# Patient Record
Sex: Female | Born: 1950 | Race: White | Hispanic: No | Smoking: Former smoker
Health system: Southern US, Community
[De-identification: ages and names within clinical notes are randomized; demographics above are authoritative.]

## PROBLEM LIST (undated history)

## (undated) DIAGNOSIS — Z862 Personal history of diseases of the blood and blood-forming organs and certain disorders involving the immune mechanism: Secondary | ICD-10-CM

## (undated) DIAGNOSIS — I509 Heart failure, unspecified: Secondary | ICD-10-CM

## (undated) DIAGNOSIS — I10 Essential (primary) hypertension: Secondary | ICD-10-CM

## (undated) DIAGNOSIS — K219 Gastro-esophageal reflux disease without esophagitis: Secondary | ICD-10-CM

## (undated) DIAGNOSIS — I2510 Atherosclerotic heart disease of native coronary artery without angina pectoris: Secondary | ICD-10-CM

## (undated) DIAGNOSIS — Z9289 Personal history of other medical treatment: Secondary | ICD-10-CM

## (undated) DIAGNOSIS — R011 Cardiac murmur, unspecified: Secondary | ICD-10-CM

## (undated) DIAGNOSIS — I5020 Unspecified systolic (congestive) heart failure: Secondary | ICD-10-CM

## (undated) DIAGNOSIS — M542 Cervicalgia: Secondary | ICD-10-CM

## (undated) DIAGNOSIS — N6019 Diffuse cystic mastopathy of unspecified breast: Secondary | ICD-10-CM

## (undated) DIAGNOSIS — E282 Polycystic ovarian syndrome: Secondary | ICD-10-CM

## (undated) DIAGNOSIS — M1990 Unspecified osteoarthritis, unspecified site: Secondary | ICD-10-CM

## (undated) DIAGNOSIS — N189 Chronic kidney disease, unspecified: Secondary | ICD-10-CM

## (undated) DIAGNOSIS — F419 Anxiety disorder, unspecified: Secondary | ICD-10-CM

## (undated) DIAGNOSIS — C8190 Hodgkin lymphoma, unspecified, unspecified site: Secondary | ICD-10-CM

## (undated) DIAGNOSIS — O139 Gestational [pregnancy-induced] hypertension without significant proteinuria, unspecified trimester: Secondary | ICD-10-CM

## (undated) DIAGNOSIS — I428 Other cardiomyopathies: Secondary | ICD-10-CM

## (undated) DIAGNOSIS — C541 Malignant neoplasm of endometrium: Secondary | ICD-10-CM

## (undated) HISTORY — DX: Hodgkin lymphoma, unspecified, unspecified site: C81.90

## (undated) HISTORY — DX: Gestational (pregnancy-induced) hypertension without significant proteinuria, unspecified trimester: O13.9

## (undated) HISTORY — DX: Personal history of other medical treatment: Z92.89

## (undated) HISTORY — DX: Atherosclerotic heart disease of native coronary artery without angina pectoris: I25.10

## (undated) HISTORY — DX: Personal history of diseases of the blood and blood-forming organs and certain disorders involving the immune mechanism: Z86.2

## (undated) HISTORY — DX: Polycystic ovarian syndrome: E28.2

## (undated) HISTORY — DX: Cervicalgia: M54.2

## (undated) HISTORY — PX: CERVICAL POLYPECTOMY: SHX88

## (undated) HISTORY — DX: Diffuse cystic mastopathy of unspecified breast: N60.19

## (undated) HISTORY — DX: Chronic kidney disease, unspecified: N18.9

## (undated) HISTORY — DX: Cardiac murmur, unspecified: R01.1

## (undated) HISTORY — DX: Essential (primary) hypertension: I10

## (undated) HISTORY — DX: Malignant neoplasm of endometrium: C54.1

## (undated) HISTORY — PX: OTHER SURGICAL HISTORY: SHX169

## (undated) HISTORY — PX: ABDOMINAL HYSTERECTOMY: SHX81

## (undated) HISTORY — DX: Unspecified osteoarthritis, unspecified site: M19.90

---
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
## 1980-06-15 HISTORY — PX: CHOLECYSTECTOMY: SHX55

## 2004-07-17 ENCOUNTER — Ambulatory Visit: Payer: Self-pay | Admitting: Internal Medicine

## 2004-07-29 ENCOUNTER — Ambulatory Visit: Payer: Self-pay | Admitting: Unknown Physician Specialty

## 2005-08-11 ENCOUNTER — Ambulatory Visit: Payer: Self-pay | Admitting: Unknown Physician Specialty

## 2006-11-18 ENCOUNTER — Ambulatory Visit: Payer: Self-pay | Admitting: Unknown Physician Specialty

## 2007-06-16 HISTORY — PX: OTHER SURGICAL HISTORY: SHX169

## 2007-12-28 ENCOUNTER — Ambulatory Visit: Payer: Self-pay | Admitting: Gastroenterology

## 2008-01-13 LAB — HM COLONOSCOPY: HM Colonoscopy: NORMAL

## 2008-09-12 LAB — HM COLONOSCOPY: HM Colonoscopy: NORMAL

## 2009-06-04 ENCOUNTER — Ambulatory Visit: Payer: Self-pay | Admitting: Internal Medicine

## 2009-06-15 ENCOUNTER — Ambulatory Visit: Payer: Self-pay | Admitting: Internal Medicine

## 2009-06-15 DIAGNOSIS — C8190 Hodgkin lymphoma, unspecified, unspecified site: Secondary | ICD-10-CM

## 2009-06-15 HISTORY — DX: Hodgkin lymphoma, unspecified, unspecified site: C81.90

## 2009-06-15 HISTORY — PX: LYMPH NODE BIOPSY: SHX201

## 2009-06-19 LAB — PULMONARY FUNCTION TEST

## 2009-06-25 ENCOUNTER — Ambulatory Visit: Payer: Self-pay | Admitting: Specialist

## 2009-07-01 ENCOUNTER — Ambulatory Visit: Payer: Self-pay | Admitting: General Surgery

## 2009-07-03 ENCOUNTER — Ambulatory Visit: Payer: Self-pay | Admitting: Internal Medicine

## 2009-07-10 ENCOUNTER — Inpatient Hospital Stay: Payer: Self-pay | Admitting: General Surgery

## 2009-07-16 ENCOUNTER — Ambulatory Visit: Payer: Self-pay | Admitting: Internal Medicine

## 2009-07-24 ENCOUNTER — Ambulatory Visit: Payer: Self-pay | Admitting: General Surgery

## 2009-08-13 ENCOUNTER — Ambulatory Visit: Payer: Self-pay | Admitting: Internal Medicine

## 2009-09-13 ENCOUNTER — Ambulatory Visit: Payer: Self-pay | Admitting: Internal Medicine

## 2009-10-13 ENCOUNTER — Ambulatory Visit: Payer: Self-pay | Admitting: Internal Medicine

## 2009-10-21 ENCOUNTER — Ambulatory Visit: Payer: Self-pay | Admitting: Internal Medicine

## 2009-11-13 ENCOUNTER — Ambulatory Visit: Payer: Self-pay | Admitting: Internal Medicine

## 2009-12-13 ENCOUNTER — Ambulatory Visit: Payer: Self-pay | Admitting: Internal Medicine

## 2010-01-13 ENCOUNTER — Ambulatory Visit: Payer: Self-pay | Admitting: Internal Medicine

## 2010-02-13 ENCOUNTER — Ambulatory Visit: Payer: Self-pay | Admitting: Internal Medicine

## 2010-03-15 ENCOUNTER — Ambulatory Visit: Payer: Self-pay | Admitting: Internal Medicine

## 2010-04-15 ENCOUNTER — Ambulatory Visit: Payer: Self-pay | Admitting: Internal Medicine

## 2010-05-15 ENCOUNTER — Ambulatory Visit: Payer: Self-pay | Admitting: Internal Medicine

## 2010-06-18 ENCOUNTER — Ambulatory Visit: Payer: Self-pay | Admitting: Internal Medicine

## 2010-07-16 ENCOUNTER — Ambulatory Visit: Payer: Self-pay | Admitting: Internal Medicine

## 2010-08-14 ENCOUNTER — Ambulatory Visit: Payer: Self-pay | Admitting: Internal Medicine

## 2010-09-14 ENCOUNTER — Ambulatory Visit: Payer: Self-pay | Admitting: Internal Medicine

## 2010-10-14 ENCOUNTER — Ambulatory Visit: Payer: Self-pay | Admitting: Internal Medicine

## 2010-11-14 ENCOUNTER — Ambulatory Visit: Payer: Self-pay | Admitting: Internal Medicine

## 2011-01-19 ENCOUNTER — Ambulatory Visit: Payer: Self-pay | Admitting: Internal Medicine

## 2011-02-14 ENCOUNTER — Ambulatory Visit: Payer: Self-pay | Admitting: Internal Medicine

## 2011-03-03 LAB — IFOBT (OCCULT BLOOD): IFOBT: NEGATIVE

## 2011-05-19 ENCOUNTER — Ambulatory Visit: Payer: Self-pay | Admitting: Internal Medicine

## 2011-05-25 ENCOUNTER — Ambulatory Visit: Payer: Self-pay | Admitting: Internal Medicine

## 2011-05-28 ENCOUNTER — Ambulatory Visit: Payer: Self-pay | Admitting: Internal Medicine

## 2011-06-16 ENCOUNTER — Ambulatory Visit: Payer: Self-pay | Admitting: Internal Medicine

## 2011-06-16 HISTORY — PX: OTHER SURGICAL HISTORY: SHX169

## 2011-08-06 ENCOUNTER — Ambulatory Visit: Payer: Self-pay | Admitting: Internal Medicine

## 2011-09-13 LAB — HM MAMMOGRAPHY: HM Mammogram: NORMAL

## 2011-11-17 ENCOUNTER — Ambulatory Visit: Payer: Self-pay | Admitting: Internal Medicine

## 2011-11-17 LAB — CBC CANCER CENTER
Basophil #: 0.1 x10 3/mm (ref 0.0–0.1)
Basophil %: 1 %
Eosinophil #: 0.1 x10 3/mm (ref 0.0–0.7)
Eosinophil %: 2.6 %
HCT: 42.6 % (ref 35.0–47.0)
HGB: 14.1 g/dL (ref 12.0–16.0)
Lymphocyte #: 1.3 x10 3/mm (ref 1.0–3.6)
Lymphocyte %: 23 %
MCH: 29.7 pg (ref 26.0–34.0)
MCHC: 33.2 g/dL (ref 32.0–36.0)
MCV: 90 fL (ref 80–100)
Monocyte #: 0.6 x10 3/mm (ref 0.2–0.9)
Monocyte %: 10.5 %
Neutrophil #: 3.5 x10 3/mm (ref 1.4–6.5)
Neutrophil %: 62.9 %
Platelet: 206 x10 3/mm (ref 150–440)
RBC: 4.76 10*6/uL (ref 3.80–5.20)
RDW: 13.9 % (ref 11.5–14.5)
WBC: 5.5 x10 3/mm (ref 3.6–11.0)

## 2011-11-17 LAB — HEPATIC FUNCTION PANEL A (ARMC)
Albumin: 3.8 g/dL (ref 3.4–5.0)
Alkaline Phosphatase: 89 U/L (ref 50–136)
Bilirubin, Direct: 0.2 mg/dL (ref 0.00–0.20)
Bilirubin,Total: 0.8 mg/dL (ref 0.2–1.0)
SGOT(AST): 19 U/L (ref 15–37)
SGPT (ALT): 27 U/L
Total Protein: 7.3 g/dL (ref 6.4–8.2)

## 2011-11-17 LAB — CREATININE, SERUM
Creatinine: 1.04 mg/dL (ref 0.60–1.30)
EGFR (African American): >60
EGFR (Non-African Amer.): 58 — ABNORMAL LOW

## 2011-11-17 LAB — LACTATE DEHYDROGENASE: LDH: 201 U/L (ref 84–246)

## 2011-11-17 LAB — POTASSIUM: Potassium: 4.2 mmol/L (ref 3.5–5.1)

## 2011-11-17 LAB — MAGNESIUM: Magnesium: 2.1 mg/dL

## 2011-12-14 ENCOUNTER — Ambulatory Visit: Payer: Self-pay | Admitting: Internal Medicine

## 2012-02-14 DIAGNOSIS — I2510 Atherosclerotic heart disease of native coronary artery without angina pectoris: Secondary | ICD-10-CM

## 2012-02-14 HISTORY — PX: CARDIAC CATHETERIZATION: SHX172

## 2012-02-14 HISTORY — PX: CORONARY ANGIOPLASTY: SHX604

## 2012-02-14 HISTORY — DX: Atherosclerotic heart disease of native coronary artery without angina pectoris: I25.10

## 2012-02-29 ENCOUNTER — Ambulatory Visit (INDEPENDENT_AMBULATORY_CARE_PROVIDER_SITE_OTHER): Payer: BC Managed Care – PPO | Admitting: Cardiovascular Disease

## 2012-02-29 ENCOUNTER — Encounter: Payer: Self-pay | Admitting: Cardiovascular Disease

## 2012-02-29 VITALS — BP 142/98 | HR 67 | Ht 62.0 in | Wt 195.5 lb

## 2012-02-29 DIAGNOSIS — R079 Chest pain, unspecified: Secondary | ICD-10-CM

## 2012-02-29 MED ORDER — METOPROLOL TARTRATE 25 MG PO TABS: 25.0000 mg | ORAL_TABLET | Freq: Two times a day (BID) | ORAL | Status: DC

## 2012-02-29 NOTE — Assessment & Plan Note (Signed)
The patient's chest pain is worrisome for angina. She had a highly abnormal stress echocardiogram done at [facility] which showed 2 mm ST depression with exercise as well as significant ischemia in the LAD distribution. I recommend continuing aspirin daily and starting metoprolol 25 mg twice daily. I recommend proceeding with cardiac catheterization and possible coronary intervention. Risks, benefits and alternatives were discussed with the patient. Precath labs will be checked.

## 2012-02-29 NOTE — Progress Notes (Signed)
HPI  This is a 60 year old female who was referred by Dr. Judithann Sheen for evaluation of chest pain and abnormal stress echo. The patient has no previous cardiac history. There is a reported history of hypertension and hyperlipidemia but not on any medications. She has history of Hodgkin's lymphoma treated with radiation and chemotherapy to the chest area and has been in remission since 2011. She does have pulmonary nodules which are being followed regularly every 6 months. She is a former smoker. She started having chest discomfort about 2 months ago. Still described as substernal tightness which does not radiate. It mostly happens when she is under stress and when she is anxious. She hasn't noticed that much with physical activities. She does complain of exertional dyspnea with activities. She underwent a stress echocardiogram. She was able to exercise for only 3 minutes and achieved a maximal heart rate of 166 beats per minute. She had 2 mm of ST depression in the inferior leads. Echo images showed normal LV systolic function on baseline images there was evidence of moderate hypokinesis of the septum and severe hypokinesis of the anterior wall.  Allergies  Allergen Reactions  . Antifungal (Miconazole Nitrate)   . Sulfa Antibiotics   . Z-Pak (Azithromycin)      Current Outpatient Prescriptions on File Prior to Visit  Medication Sig Dispense Refill  . gabapentin (NEURONTIN) 300 MG capsule Take 300 mg by mouth at bedtime.      . metoprolol tartrate (LOPRESSOR) 25 MG tablet Take 1 tablet (25 mg total) by mouth 2 (two) times daily.  60 tablet  3     Past Medical History  Diagnosis Date  . Essential hypertension, benign   . Other and unspecified hyperlipidemia   . Chronic airway obstruction, not elsewhere classified   . Cervicalgia   . Osteoarthritis   . Fibrocystic breast disease   . Polycystic ovarian disease   . History of anemia   . Gestational hypertension   . Hodgkin's lymphoma    s/p radiation and chemo therapy     Past Surgical History  Procedure Date  . Cholecystectomy   . Bladder sling      History reviewed. No pertinent family history.   History   Social History  . Marital Status: Married    Spouse Name: N/A    Number of Children: N/A  . Years of Education: N/A   Occupational History  . Not on file.   Social History Main Topics  . Smoking status: Former Smoker -- 1.0 packs/day for 30 years    Types: Cigarettes  . Smokeless tobacco: Not on file  . Alcohol Use: Yes     occasional  . Drug Use: No  . Sexually Active:    Other Topics Concern  . Not on file   Social History Narrative  . No narrative on file     ROS Constitutional: Negative for fever, chills, diaphoresis, activity change, appetite change . HENT: Negative for hearing loss, nosebleeds, congestion, sore throat, facial swelling, drooling, trouble swallowing, neck pain, voice change, sinus pressure and tinnitus.  Eyes: Negative for photophobia, pain, discharge and visual disturbance.  Respiratory: Negative for apnea, cough and wheezing.  Cardiovascular: Negative for palpitations and leg swelling.  Gastrointestinal: Negative for nausea, vomiting, abdominal pain, diarrhea, constipation, blood in stool and abdominal distention.  Genitourinary: Negative for dysuria, urgency, frequency, hematuria and decreased urine volume.  Musculoskeletal: Negative for myalgias, back pain, joint swelling, arthralgias and gait problem.  Skin: Negative for color  change, pallor, rash and wound.  Neurological: Negative for dizziness, tremors, seizures, syncope, speech difficulty, weakness, light-headedness, numbness and headaches.  Psychiatric/Behavioral: Negative for suicidal ideas, hallucinations, behavioral problems and agitation. The patient is not nervous/anxious.     PHYSICAL EXAM   BP 142/98  Pulse 67  Ht 5\' 2"  (1.575 m)  Wt 195 lb 8 oz (88.678 kg)  BMI 35.76 kg/m2 Constitutional: She  is oriented to person, place, and time. She appears well-developed and well-nourished. No distress.  HENT: No nasal discharge.  Head: Normocephalic and atraumatic.  Eyes: Pupils are equal and round. Right eye exhibits no discharge. Left eye exhibits no discharge.  Neck: Normal range of motion. Neck supple. No JVD present. No thyromegaly present.  Cardiovascular: Normal rate, regular rhythm, normal heart sounds. Exam reveals no gallop and no friction rub. No murmur heard.  Pulmonary/Chest: Effort normal and breath sounds normal. No stridor. No respiratory distress. She has no wheezes. She has no rales. She exhibits no tenderness.  Abdominal: Soft. Bowel sounds are normal. She exhibits no distension. There is no tenderness. There is no rebound and no guarding.  Musculoskeletal: Normal range of motion. She exhibits no edema and no tenderness.  Neurological: She is alert and oriented to person, place, and time. Coordination normal.  Skin: Skin is warm and dry. No rash noted. She is not diaphoretic. No erythema. No pallor.  Psychiatric: She has a normal mood and affect. Her behavior is normal. Judgment and thought content normal.     EKG: Sinus  Rhythm  WITHIN NORMAL LIMITS   ASSESSMENT AND PLAN

## 2012-02-29 NOTE — Patient Instructions (Addendum)
Your physician has requested that you have a cardiac catheterization. Cardiac catheterization is used to diagnose and/or treat various heart conditions. Doctors may recommend this procedure for a number of different reasons. The most common reason is to evaluate chest pain. Chest pain can be a symptom of coronary artery disease (CAD), and cardiac catheterization can show whether plaque is narrowing or blocking your heart's arteries. This procedure is also used to evaluate the valves, as well as measure the blood flow and oxygen levels in different parts of your heart. For further information please visit [email or web address] Please follow instruction sheet, as given.  Start Metoprolol 25 mg twice daily.

## 2012-03-01 ENCOUNTER — Other Ambulatory Visit: Payer: Self-pay

## 2012-03-01 LAB — PROTIME-INR
INR: 1 (ref 0.8–1.2)
Prothrombin Time: 10.3 s (ref 9.1–12.0)

## 2012-03-01 LAB — BASIC METABOLIC PANEL
BUN/Creatinine Ratio: 13 (ref 11–26)
BUN: 13 mg/dL (ref 8–27)
CO2: 19 mmol/L (ref 19–28)
Calcium: 9.5 mg/dL (ref 8.6–10.2)
Chloride: 104 mmol/L (ref 97–108)
Creatinine, Ser: 1.01 mg/dL — ABNORMAL HIGH (ref 0.57–1.00)
GFR calc Af Amer: 69 mL/min/{1.73_m2} (ref >59)
GFR calc non Af Amer: 60 mL/min/{1.73_m2} (ref >59)
Glucose: 142 mg/dL — ABNORMAL HIGH (ref 65–99)
Potassium: 4.5 mmol/L (ref 3.5–5.2)
Sodium: 143 mmol/L (ref 134–144)

## 2012-03-01 LAB — CBC WITH DIFFERENTIAL
Basophils Absolute: 0 10*3/uL (ref 0.0–0.2)
Basos: 1 % (ref 0–3)
Eos: 1 % (ref 0–5)
Eosinophils Absolute: 0.1 10*3/uL (ref 0.0–0.4)
HCT: 44.5 % (ref 34.0–46.6)
Hemoglobin: 14.5 g/dL (ref 11.1–15.9)
Immature Grans (Abs): 0 10*3/uL (ref 0.0–0.1)
Immature Granulocytes: 0 % (ref 0–2)
Lymphocytes Absolute: 1.5 10*3/uL (ref 0.7–3.1)
Lymphs: 19 % (ref 14–46)
MCH: 29.7 pg (ref 26.6–33.0)
MCHC: 32.6 g/dL (ref 31.5–35.7)
MCV: 91 fL (ref 79–97)
Monocytes Absolute: 0.4 10*3/uL (ref 0.1–0.9)
Monocytes: 5 % (ref 4–12)
Neutrophils Absolute: 6.1 10*3/uL (ref 1.4–7.0)
Neutrophils Relative %: 74 % (ref 40–74)
Platelets: 268 10*3/uL (ref 155–379)
RBC: 4.89 x10E6/uL (ref 3.77–5.28)
RDW: 14 % (ref 12.3–15.4)
WBC: 8.1 10*3/uL (ref 3.4–10.8)

## 2012-03-01 MED ORDER — METOPROLOL TARTRATE 25 MG PO TABS: 25.0000 mg | ORAL_TABLET | Freq: Two times a day (BID) | ORAL | Status: DC

## 2012-03-04 ENCOUNTER — Other Ambulatory Visit: Payer: Self-pay | Admitting: Cardiovascular Disease

## 2012-03-04 ENCOUNTER — Ambulatory Visit: Payer: Self-pay | Admitting: Cardiovascular Disease

## 2012-03-04 DIAGNOSIS — I2510 Atherosclerotic heart disease of native coronary artery without angina pectoris: Secondary | ICD-10-CM

## 2012-03-04 LAB — CK TOTAL AND CKMB (NOT AT ARMC)
CK, Total: 56 U/L (ref 21–215)
CK-MB: 1.4 ng/mL (ref 0.5–3.6)

## 2012-03-04 MED ORDER — CLOPIDOGREL BISULFATE 75 MG PO TABS: 75.0000 mg | ORAL_TABLET | Freq: Every day | ORAL | Status: DC

## 2012-03-04 MED ORDER — ATORVASTATIN CALCIUM 40 MG PO TABS: 40.0000 mg | ORAL_TABLET | Freq: Every day | ORAL | Status: DC

## 2012-03-05 LAB — BASIC METABOLIC PANEL
Anion Gap: 11 (ref 7–16)
BUN: 15 mg/dL (ref 7–18)
Calcium, Total: 8.7 mg/dL (ref 8.5–10.1)
Chloride: 107 mmol/L (ref 98–107)
Co2: 26 mmol/L (ref 21–32)
Creatinine: 0.97 mg/dL (ref 0.60–1.30)
EGFR (African American): >60
EGFR (Non-African Amer.): >60
Glucose: 99 mg/dL (ref 65–99)
Osmolality: 288 (ref 275–301)
Potassium: 4 mmol/L (ref 3.5–5.1)
Sodium: 144 mmol/L (ref 136–145)

## 2012-03-09 ENCOUNTER — Ambulatory Visit: Payer: Self-pay | Admitting: Cardiovascular Disease

## 2012-03-09 ENCOUNTER — Encounter: Payer: Self-pay | Admitting: *Deleted

## 2012-03-10 ENCOUNTER — Encounter: Payer: Self-pay | Admitting: Cardiovascular Disease

## 2012-03-10 ENCOUNTER — Ambulatory Visit (INDEPENDENT_AMBULATORY_CARE_PROVIDER_SITE_OTHER): Payer: BC Managed Care – PPO | Admitting: Cardiovascular Disease

## 2012-03-10 VITALS — BP 140/80 | HR 76 | Ht 62.0 in | Wt 192.5 lb

## 2012-03-10 DIAGNOSIS — E785 Hyperlipidemia, unspecified: Secondary | ICD-10-CM

## 2012-03-10 DIAGNOSIS — I2510 Atherosclerotic heart disease of native coronary artery without angina pectoris: Secondary | ICD-10-CM

## 2012-03-10 DIAGNOSIS — I25810 Atherosclerosis of coronary artery bypass graft(s) without angina pectoris: Secondary | ICD-10-CM

## 2012-03-10 NOTE — Patient Instructions (Addendum)
Continue same medications.  Fasting labs in 1 month.  Try to attend cardiac rehab.  Resume work on Monday , March 14, 2012 without restrictions.   Follow up in 3 months.

## 2012-03-10 NOTE — Assessment & Plan Note (Signed)
She is doing well after her recent angioplasty and drug-eluting stent placement to the proximal LAD. Continue dual antiplatelet therapy for at least one year. The patient can resume work on Monday without restrictions. She can also resume bowling. I advised her to attend cardiac rehabilitation. The patient is planning to have a tooth extraction in the near future. I told her that there is no contraindication from a cardiac standpoint with both aspirin and Plavix cannot be stopped at this time. No need for antibiotic prophylaxis.

## 2012-03-10 NOTE — Assessment & Plan Note (Signed)
She was started on atorvastatin 40 mg once daily. I will obtain a fasting lipid and liver profile in 4 weeks from now.

## 2012-03-10 NOTE — Progress Notes (Signed)
HPI  This is a 61 year old female who is here today for a followup visit. She was seen recently for chest pain and abnormal stress echo. The patient has no previous cardiac history.She has history of Hodgkin's lymphoma treated with radiation and chemotherapy to the chest area and has been in remission since 2011. She does have pulmonary nodules which are being followed regularly every 6 months. She is a former smoker. Stress echocardiogram showed evidence of severe anterior wall ischemia. She underwent cardiac catheterization via the right radial artery which showed a 95% stenosis in the proximal LAD as well as 90% ostial first diagonal stenosis. This was a bifurcation stenosis. She underwent a complex but successful PCI with balloon angioplasty of first diagonal with a scoring balloon as well as drug-eluting stent placement to the proximal LAD. She has been doing well since then. She denies any chest pain or dyspnea. She is tolerating treatment with dual antiplatelet therapy. She was also started on atorvastatin.  Allergies  Allergen Reactions  . Antifungal (Miconazole Nitrate)   . Sulfa Antibiotics   . Z-Pak (Azithromycin)      Current Outpatient Prescriptions on File Prior to Visit  Medication Sig Dispense Refill  . aspirin 81 MG tablet Take 81 mg by mouth daily.      Marland Kitchen atorvastatin (LIPITOR) 40 MG tablet Take 1 tablet (40 mg total) by mouth at bedtime.  30 tablet  6  . clopidogrel (PLAVIX) 75 MG tablet Take 1 tablet (75 mg total) by mouth daily.  30 tablet  11  . Cyanocobalamin (VITAMIN B 12 PO) Take by mouth. Injection weekly.      Marland Kitchen gabapentin (NEURONTIN) 300 MG capsule Take 300 mg by mouth at bedtime.      . metoprolol tartrate (LOPRESSOR) 25 MG tablet Take 1 tablet (25 mg total) by mouth 2 (two) times daily.  60 tablet  3     Past Medical History  Diagnosis Date  . Essential hypertension, benign   . Chronic airway obstruction, not elsewhere classified   . Cervicalgia   .  Osteoarthritis   . Fibrocystic breast disease   . Polycystic ovarian disease   . History of anemia   . Gestational hypertension   . Hodgkin's lymphoma     s/p radiation and chemo therapy  . Coronary artery disease 02/2012    Abnormal stress test with anterior wall ischemia. Cardiac catheterization showed a 95% stenosis in the proximal LAD bifurcating with a 90% stenosis in first diagonal. Ejection fraction was 45% with anterior wall hypokinesis. She underwent balloon angioplasty to ostial first diagonal and drug-eluting stent placement to proximal LAD with a 3.0 x 15 mm Xience EX drug-eluting stent  . Other and unspecified hyperlipidemia      Past Surgical History  Procedure Date  . Cholecystectomy   . Bladder sling   . Cardiac catheterization 02/2012    [facility]  . Coronary angioplasty 02/2012    left/right s/p balloon     History reviewed. No pertinent family history.   History   Social History  . Marital Status: Married    Spouse Name: N/A    Number of Children: N/A  . Years of Education: N/A   Occupational History  . Not on file.   Social History Main Topics  . Smoking status: Former Smoker -- 1.0 packs/day for 30 years    Types: Cigarettes  . Smokeless tobacco: Not on file  . Alcohol Use: Yes     occasional  .  Drug Use: No  . Sexually Active:    Other Topics Concern  . Not on file   Social History Narrative  . No narrative on file     PHYSICAL EXAM   BP 140/80  Pulse 76  Ht 5\' 2"  (1.575 m)  Wt 192 lb 8 oz (87.317 kg)  BMI 35.21 kg/m2  Constitutional: She is oriented to person, place, and time. She appears well-developed and well-nourished. No distress.  HENT: No nasal discharge.  Head: Normocephalic and atraumatic.  Eyes: Pupils are equal and round. Right eye exhibits no discharge. Left eye exhibits no discharge.  Neck: Normal range of motion. Neck supple. No JVD present. No thyromegaly present.  Cardiovascular: Normal rate, regular rhythm, normal  heart sounds. Exam reveals no gallop and no friction rub. No murmur heard.  Pulmonary/Chest: Effort normal and breath sounds normal. No stridor. No respiratory distress. She has no wheezes. She has no rales. She exhibits no tenderness.  Abdominal: Soft. Bowel sounds are normal. She exhibits no distension. There is no tenderness. There is no rebound and no guarding.  Musculoskeletal: Normal range of motion. She exhibits no edema and no tenderness.  Neurological: She is alert and oriented to person, place, and time. Coordination normal.  Skin: Skin is warm and dry. No rash noted. She is not diaphoretic. No erythema. No pallor.  Psychiatric: She has a normal mood and affect. Her behavior is normal. Judgment and thought content normal.  Right radial pulse is normal. No hematoma.  EKG: Normal sinus rhythm with no significant ischemic changes.   ASSESSMENT AND PLAN

## 2012-03-25 ENCOUNTER — Telehealth: Payer: Self-pay | Admitting: Cardiovascular Disease

## 2012-03-25 NOTE — Telephone Encounter (Signed)
Pt says she was bowling last night with friends, one of which was a Engineer, civil (consulting), who suggested she call our office today.  She says she has had significant bruising on arms, abdomen and upper part of legs.  She denies blood in urine/stool/elsewhere.  She is not concerned, but was suggested by nurse to call us. I told her I would inform Dr. Kirke Corin and call pt back. I explained he is not back in office until Monday 03/28/12.  Understanding verb.

## 2012-03-25 NOTE — Telephone Encounter (Signed)
Please see below and advise. thanks 

## 2012-03-25 NOTE — Telephone Encounter (Signed)
Pt calling and has a lot of brusing on her arms and wants to know if she should be concerned.

## 2012-03-28 NOTE — Telephone Encounter (Signed)
Check CBC with platelet count.

## 2012-03-29 NOTE — Telephone Encounter (Signed)
Pt informed. Understanding verb. Coming in Thursday for labs.

## 2012-03-29 NOTE — Telephone Encounter (Signed)
LMTCB

## 2012-03-31 ENCOUNTER — Ambulatory Visit (INDEPENDENT_AMBULATORY_CARE_PROVIDER_SITE_OTHER): Payer: BC Managed Care – PPO

## 2012-03-31 ENCOUNTER — Other Ambulatory Visit: Payer: Self-pay

## 2012-03-31 DIAGNOSIS — T148XXA Other injury of unspecified body region, initial encounter: Secondary | ICD-10-CM

## 2012-03-31 DIAGNOSIS — I2510 Atherosclerotic heart disease of native coronary artery without angina pectoris: Secondary | ICD-10-CM

## 2012-03-31 DIAGNOSIS — IMO0002 Reserved for concepts with insufficient information to code with codable children: Secondary | ICD-10-CM

## 2012-04-01 LAB — CBC WITH DIFFERENTIAL/PLATELET
Basophils Absolute: 0 10*3/uL (ref 0.0–0.2)
Basos: 0 % (ref 0–3)
Eos: 1 % (ref 0–5)
Eosinophils Absolute: 0.1 10*3/uL (ref 0.0–0.4)
HCT: 39 % (ref 34.0–46.6)
Hemoglobin: 12.9 g/dL (ref 11.1–15.9)
Immature Grans (Abs): 0 10*3/uL (ref 0.0–0.1)
Immature Granulocytes: 0 % (ref 0–2)
Lymphocytes Absolute: 1.3 10*3/uL (ref 0.7–3.1)
Lymphs: 18 % (ref 14–46)
MCH: 29.7 pg (ref 26.6–33.0)
MCHC: 33.1 g/dL (ref 31.5–35.7)
MCV: 90 fL (ref 79–97)
Monocytes Absolute: 0.6 10*3/uL (ref 0.1–0.9)
Monocytes: 8 % (ref 4–12)
Neutrophils Absolute: 5.2 10*3/uL (ref 1.4–7.0)
Neutrophils Relative %: 73 % (ref 40–74)
RBC: 4.34 x10E6/uL (ref 3.77–5.28)
RDW: 14 % (ref 12.3–15.4)
WBC: 7.1 10*3/uL (ref 3.4–10.8)

## 2012-04-08 ENCOUNTER — Other Ambulatory Visit: Payer: Self-pay

## 2012-04-11 ENCOUNTER — Other Ambulatory Visit: Payer: Self-pay

## 2012-04-11 ENCOUNTER — Ambulatory Visit (INDEPENDENT_AMBULATORY_CARE_PROVIDER_SITE_OTHER): Payer: BC Managed Care – PPO

## 2012-04-11 DIAGNOSIS — E785 Hyperlipidemia, unspecified: Secondary | ICD-10-CM

## 2012-04-11 DIAGNOSIS — T148XXA Other injury of unspecified body region, initial encounter: Secondary | ICD-10-CM

## 2012-04-12 LAB — HEPATIC FUNCTION PANEL
ALT: 23 IU/L (ref 0–32)
AST: 25 IU/L (ref 0–40)
Albumin: 4.2 g/dL (ref 3.6–4.8)
Alkaline Phosphatase: 79 IU/L (ref 47–112)
Bilirubin, Direct: 0.26 mg/dL (ref 0.00–0.40)
Total Bilirubin: 0.8 mg/dL (ref 0.0–1.2)
Total Protein: 6.3 g/dL (ref 6.0–8.5)

## 2012-04-12 LAB — CBC WITH DIFFERENTIAL
Basophils Absolute: 0 10*3/uL (ref 0.0–0.2)
Basos: 0 % (ref 0–3)
Eos: 1 % (ref 0–5)
Eosinophils Absolute: 0.1 10*3/uL (ref 0.0–0.4)
HCT: 40.9 % (ref 34.0–46.6)
Hemoglobin: 13.3 g/dL (ref 11.1–15.9)
Immature Grans (Abs): 0 10*3/uL (ref 0.0–0.1)
Immature Granulocytes: 0 % (ref 0–2)
Lymphocytes Absolute: 1.2 10*3/uL (ref 0.7–3.1)
Lymphs: 18 % (ref 14–46)
MCH: 29.6 pg (ref 26.6–33.0)
MCHC: 32.5 g/dL (ref 31.5–35.7)
MCV: 91 fL (ref 79–97)
Monocytes Absolute: 0.5 10*3/uL (ref 0.1–0.9)
Monocytes: 7 % (ref 4–12)
Neutrophils Absolute: 5.1 10*3/uL (ref 1.4–7.0)
Neutrophils Relative %: 74 % (ref 40–74)
Platelets: 233 10*3/uL (ref 155–379)
RBC: 4.5 x10E6/uL (ref 3.77–5.28)
RDW: 14.2 % (ref 12.3–15.4)
WBC: 6.9 10*3/uL (ref 3.4–10.8)

## 2012-04-12 LAB — LIPID PANEL
Chol/HDL Ratio: 2 ratio units (ref 0.0–4.4)
Cholesterol, Total: 110 mg/dL (ref 100–199)
HDL: 56 mg/dL (ref >39)
LDL Calculated: 41 mg/dL (ref 0–99)
Triglycerides: 67 mg/dL (ref 0–149)
VLDL Cholesterol Cal: 13 mg/dL (ref 5–40)

## 2012-04-19 NOTE — Progress Notes (Signed)
This encounter was created in error - please disregard.

## 2012-04-30 ENCOUNTER — Encounter: Payer: Self-pay | Admitting: Cardiovascular Disease

## 2012-04-30 LAB — HM PAP SMEAR: HM Pap smear: NORMAL

## 2012-05-19 ENCOUNTER — Ambulatory Visit: Payer: Self-pay | Admitting: Obstetrics and Gynecology

## 2012-05-20 ENCOUNTER — Ambulatory Visit: Payer: Self-pay | Admitting: Internal Medicine

## 2012-05-20 LAB — HEPATIC FUNCTION PANEL A (ARMC)
Albumin: 3.9 g/dL (ref 3.4–5.0)
Alkaline Phosphatase: 96 U/L (ref 50–136)
Bilirubin, Direct: 0.3 mg/dL — ABNORMAL HIGH (ref 0.00–0.20)
Bilirubin,Total: 1.4 mg/dL — ABNORMAL HIGH (ref 0.2–1.0)
SGOT(AST): 29 U/L (ref 15–37)
SGPT (ALT): 36 U/L (ref 12–78)
Total Protein: 7 g/dL (ref 6.4–8.2)

## 2012-05-20 LAB — CBC CANCER CENTER
Basophil #: 0.1 x10 3/mm (ref 0.0–0.1)
Basophil %: 0.8 %
Eosinophil #: 0.1 x10 3/mm (ref 0.0–0.7)
Eosinophil %: 0.8 %
HCT: 40 % (ref 35.0–47.0)
HGB: 13.8 g/dL (ref 12.0–16.0)
Lymphocyte #: 1.4 x10 3/mm (ref 1.0–3.6)
Lymphocyte %: 17.1 %
MCH: 30.8 pg (ref 26.0–34.0)
MCHC: 34.5 g/dL (ref 32.0–36.0)
MCV: 89 fL (ref 80–100)
Monocyte #: 0.5 x10 3/mm (ref 0.2–0.9)
Monocyte %: 6.4 %
Neutrophil #: 6.2 x10 3/mm (ref 1.4–6.5)
Neutrophil %: 74.9 %
Platelet: 209 x10 3/mm (ref 150–440)
RBC: 4.49 10*6/uL (ref 3.80–5.20)
RDW: 13.8 % (ref 11.5–14.5)
WBC: 8.2 x10 3/mm (ref 3.6–11.0)

## 2012-05-20 LAB — BASIC METABOLIC PANEL
Anion Gap: 11 (ref 7–16)
BUN: 20 mg/dL — ABNORMAL HIGH (ref 7–18)
Calcium, Total: 9.4 mg/dL (ref 8.5–10.1)
Chloride: 106 mmol/L (ref 98–107)
Co2: 24 mmol/L (ref 21–32)
Creatinine: 1.38 mg/dL — ABNORMAL HIGH (ref 0.60–1.30)
EGFR (African American): 47 — ABNORMAL LOW
EGFR (Non-African Amer.): 41 — ABNORMAL LOW
Glucose: 99 mg/dL (ref 65–99)
Osmolality: 284 (ref 275–301)
Potassium: 4.5 mmol/L (ref 3.5–5.1)
Sodium: 141 mmol/L (ref 136–145)

## 2012-05-20 LAB — LACTATE DEHYDROGENASE: LDH: 216 U/L (ref 81–246)

## 2012-06-13 ENCOUNTER — Encounter: Payer: Self-pay | Admitting: Cardiovascular Disease

## 2012-06-13 ENCOUNTER — Ambulatory Visit (INDEPENDENT_AMBULATORY_CARE_PROVIDER_SITE_OTHER): Payer: BC Managed Care – PPO | Admitting: Cardiovascular Disease

## 2012-06-13 VITALS — BP 120/72 | HR 88 | Ht 61.0 in | Wt 194.5 lb

## 2012-06-13 DIAGNOSIS — I2510 Atherosclerotic heart disease of native coronary artery without angina pectoris: Secondary | ICD-10-CM

## 2012-06-13 DIAGNOSIS — E785 Hyperlipidemia, unspecified: Secondary | ICD-10-CM

## 2012-06-13 MED ORDER — ATORVASTATIN CALCIUM 10 MG PO TABS: 10.0000 mg | ORAL_TABLET | Freq: Every day | ORAL | Status: DC

## 2012-06-13 NOTE — Assessment & Plan Note (Signed)
She is doing well with no symptoms suggestive of angina.  Continue dual antiplatelet therapy for at least one year. She could not attend cardiac rehabilitation but will be stopping exercise on her own.

## 2012-06-13 NOTE — Assessment & Plan Note (Signed)
Lab Results  Component Value Date   HDL 56 04/11/2012   LDLCALC 41 04/11/2012   TRIG 67 04/11/2012   CHOLHDL 2.0 04/11/2012   She reports significant myalgia with current dose of atorvastatin 40 mg daily. Thus, I will decrease the dose to 10 mg daily and reevaluate her symptoms.

## 2012-06-13 NOTE — Patient Instructions (Addendum)
Decrease Atorvastatin to 10 mg daily.  Follow up in 6 months.

## 2012-06-13 NOTE — Progress Notes (Signed)
HPI  This is a 61 year old female who is here today for a followup visit regarding coronary artery disease.  The patient has no previous cardiac history.She has history of Hodgkin's lymphoma treated with radiation and chemotherapy to the chest area and has been in remission since 2011. She does have pulmonary nodules which are being followed regularly every 6 months. She is a former smoker. Stress echocardiogram this year showed evidence of severe anterior wall ischemia. She underwent cardiac catheterization  which showed a 95% stenosis in the proximal LAD as well as 90% ostial first diagonal stenosis. This was a bifurcation stenosis. She underwent a complex but successful PCI with balloon angioplasty of first diagonal  as well as drug-eluting stent placement to the proximal LAD. She had very few episodes of right-sided chest pain which is  different from her prior angina. She is tolerating treatment with dual antiplatelet therapy. She reports muscle aches in the lower back, thighs and shoulders since she has been started on atorvastatin.  Allergies  Allergen Reactions  . Antifungal (Miconazole Nitrate)   . Sulfa Antibiotics   . Z-Pak (Azithromycin)      Current Outpatient Prescriptions on File Prior to Visit  Medication Sig Dispense Refill  . aspirin 81 MG tablet Take 81 mg by mouth daily.      . clopidogrel (PLAVIX) 75 MG tablet Take 1 tablet (75 mg total) by mouth daily.  30 tablet  11  . Cyanocobalamin (VITAMIN B 12 PO) Take by mouth every 30 (thirty) days. Injection weekly.      Marland Kitchen gabapentin (NEURONTIN) 300 MG capsule Take 600 mg by mouth at bedtime.       . metoprolol tartrate (LOPRESSOR) 25 MG tablet Take 1 tablet (25 mg total) by mouth 2 (two) times daily.  60 tablet  3     Past Medical History  Diagnosis Date  . Essential hypertension, benign   . Chronic airway obstruction, not elsewhere classified   . Cervicalgia   . Osteoarthritis   . Fibrocystic breast disease   .  Polycystic ovarian disease   . History of anemia   . Gestational hypertension   . Hodgkin's lymphoma     s/p radiation and chemo therapy  . Coronary artery disease 02/2012    Abnormal stress test with anterior wall ischemia. Cardiac catheterization showed a 95% stenosis in the proximal LAD bifurcating with a 90% stenosis in first diagonal. Ejection fraction was 45% with anterior wall hypokinesis. She underwent balloon angioplasty to ostial first diagonal and drug-eluting stent placement to proximal LAD with a 3.0 x 15 mm Xience EX drug-eluting stent  . Other and unspecified hyperlipidemia      Past Surgical History  Procedure Date  . Cholecystectomy   . Bladder sling   . Cardiac catheterization 02/2012    [facility]  . Coronary angioplasty 02/2012    left/right s/p balloon     History reviewed. No pertinent family history.   History   Social History  . Marital Status: Married    Spouse Name: N/A    Number of Children: N/A  . Years of Education: N/A   Occupational History  . Not on file.   Social History Main Topics  . Smoking status: Former Smoker -- 1.0 packs/day for 30 years    Types: Cigarettes  . Smokeless tobacco: Not on file  . Alcohol Use: Yes     Comment: occasional  . Drug Use: No  . Sexually Active:    Other Topics  Concern  . Not on file   Social History Narrative  . No narrative on file     PHYSICAL EXAM   BP 120/72  Pulse 88  Ht 5\' 1"  (1.549 m)  Wt 194 lb 8 oz (88.225 kg)  BMI 36.75 kg/m2  Constitutional: She is oriented to person, place, and time. She appears well-developed and well-nourished. No distress.  HENT: No nasal discharge.  Head: Normocephalic and atraumatic.  Eyes: Pupils are equal and round. Right eye exhibits no discharge. Left eye exhibits no discharge.  Neck: Normal range of motion. Neck supple. No JVD present. No thyromegaly present.  Cardiovascular: Normal rate, regular rhythm, normal heart sounds. Exam reveals no gallop and no  friction rub. No murmur heard.  Pulmonary/Chest: Effort normal and breath sounds normal. No stridor. No respiratory distress. She has no wheezes. She has no rales. She exhibits no tenderness.  Abdominal: Soft. Bowel sounds are normal. She exhibits no distension. There is no tenderness. There is no rebound and no guarding.  Musculoskeletal: Normal range of motion. She exhibits no edema and no tenderness.  Neurological: She is alert and oriented to person, place, and time. Coordination normal.  Skin: Skin is warm and dry. No rash noted. She is not diaphoretic. No erythema. No pallor.  Psychiatric: She has a normal mood and affect. Her behavior is normal. Judgment and thought content normal.     ASSESSMENT AND PLAN

## 2012-06-14 ENCOUNTER — Ambulatory Visit: Payer: Self-pay | Admitting: Internal Medicine

## 2012-06-14 LAB — HM MAMMOGRAPHY: HM Mammogram: NORMAL

## 2012-06-14 LAB — HM DEXA SCAN

## 2012-06-15 ENCOUNTER — Ambulatory Visit: Payer: Self-pay | Admitting: Internal Medicine

## 2012-07-30 ENCOUNTER — Other Ambulatory Visit: Payer: Self-pay

## 2012-09-12 ENCOUNTER — Ambulatory Visit (INDEPENDENT_AMBULATORY_CARE_PROVIDER_SITE_OTHER)
Admission: RE | Admit: 2012-09-12 | Discharge: 2012-09-12 | Disposition: A | Discharge status: Home / Self Care | Payer: BC Managed Care – PPO | Source: Ambulatory Visit | Attending: Internal Medicine | Admitting: Internal Medicine

## 2012-09-12 ENCOUNTER — Ambulatory Visit (INDEPENDENT_AMBULATORY_CARE_PROVIDER_SITE_OTHER): Payer: BC Managed Care – PPO | Admitting: Internal Medicine

## 2012-09-12 ENCOUNTER — Encounter: Payer: Self-pay | Admitting: Internal Medicine

## 2012-09-12 VITALS — BP 130/66 | HR 83 | Temp 97.6°F | Resp 18 | Ht 61.0 in | Wt 199.8 lb

## 2012-09-12 DIAGNOSIS — R609 Edema, unspecified: Secondary | ICD-10-CM

## 2012-09-12 DIAGNOSIS — R5381 Other malaise: Secondary | ICD-10-CM

## 2012-09-12 DIAGNOSIS — C8589 Other specified types of non-Hodgkin lymphoma, extranodal and solid organ sites: Secondary | ICD-10-CM

## 2012-09-12 DIAGNOSIS — G620 Drug-induced polyneuropathy: Secondary | ICD-10-CM | POA: Insufficient documentation

## 2012-09-12 DIAGNOSIS — G8929 Other chronic pain: Secondary | ICD-10-CM

## 2012-09-12 DIAGNOSIS — Z8571 Personal history of Hodgkin lymphoma: Secondary | ICD-10-CM

## 2012-09-12 DIAGNOSIS — E785 Hyperlipidemia, unspecified: Secondary | ICD-10-CM

## 2012-09-12 DIAGNOSIS — R5383 Other fatigue: Secondary | ICD-10-CM

## 2012-09-12 DIAGNOSIS — Z86010 Personal history of colonic polyps: Secondary | ICD-10-CM

## 2012-09-12 DIAGNOSIS — M25559 Pain in unspecified hip: Secondary | ICD-10-CM

## 2012-09-12 DIAGNOSIS — R600 Localized edema: Secondary | ICD-10-CM | POA: Insufficient documentation

## 2012-09-12 DIAGNOSIS — D849 Immunodeficiency, unspecified: Secondary | ICD-10-CM

## 2012-09-12 DIAGNOSIS — M25552 Pain in left hip: Secondary | ICD-10-CM

## 2012-09-12 DIAGNOSIS — G63 Polyneuropathy in diseases classified elsewhere: Secondary | ICD-10-CM | POA: Insufficient documentation

## 2012-09-12 DIAGNOSIS — Z860101 Personal history of adenomatous and serrated colon polyps: Secondary | ICD-10-CM

## 2012-09-12 DIAGNOSIS — C8590 Non-Hodgkin lymphoma, unspecified, unspecified site: Secondary | ICD-10-CM

## 2012-09-12 DIAGNOSIS — M25551 Pain in right hip: Secondary | ICD-10-CM

## 2012-09-12 NOTE — Patient Instructions (Addendum)
Decrease the gabapentin to once daily for 1 week,  Then stop.   Take the furosemide once daily in the morning(after your stop the gabapentin)  for 3 days maximum., then stop  (stop earlier if cramping )   If the fluid comes back  Resume it and let me know so we can repeat your ECHO  Return for fasting bloodwork next  Friday (we will check your potassium too) make an appt at front desk   Palin films of hips  At [facility] at your leisure   Try substituting  bedtime benadryl  For zyrtec to treat runny nose -----------------------------------------------------------------------------------------------------------------------------------------------------  This is  One version of a  "Low GI"  Diet:  It will still lower your blood sugars and allow you to lose 4 to 8  lbs  per month if you follow it carefully.  Your goal with exercise is a minimum of 30 minutes of aerobic exercise 5 days per week (Walking does not count once it becomes easy!)    All of the foods can be found at grocery stores and in bulk at [employer].  The Atkins protein bars and shakes are available in more varieties at Target, WalMart and [employer].     7 AM Breakfast:  Choose from the following:  Low carbohydrate Protein  Shakes (I recommend the [employer] AdvantEdge "Carb Control" shakes  Or the low carb shakes by Atkins.    2.5 carbs   Arnold's "Sandwhich Thin"toasted  w/ peanut butter (no jelly: about 20 net carbs  "Bagel Thin" with cream cheese and salmon: about 20 carbs   a scrambled egg/bacon/cheese burrito made with Mission's "carb balance" whole wheat tortilla  (about 10 net carbs )   Avoid cereal and bananas, oatmeal and cream of wheat and grits. They are loaded with carbohydrates!   10 AM: high protein snack  Protein bar by Atkins (the snack size, under 200 cal, usually < 6 net carbs).    A stick of cheese:  Around 1 carb,  100 cal     Dannon Light n Fit [place] Yogurt  (80 cal, 8 carbs)  Other so called "protein  bars" and Greek yogurts tend to be loaded with carbohydrates.  Remember, in food advertising, the word "energy" is synonymous for " carbohydrate."  Lunch:   A Sandwich using the bread choices listed, Can use any  Eggs,  lunchmeat, grilled meat or canned tuna), avocado, regular mayo/mustard  and cheese.  A Salad using blue cheese, ranch,  Goddess or vinagrette,  No croutons or "confetti" and no "candied nuts" but regular nuts OK.   No pretzels or chips.  Pickles and miniature sweet peppers are a good low carb alternative that provide a "crunch"  The bread is the only source of carbohydrate in a sandwich and  can be decreased by trying some of these alternatives to traditional loaf bread  Joseph's makes a pita bread and a flat bread that are 50 cal and 4 net carbs available at [employer]Js and [employer].  This can be toasted to use with hummous as well  Toufayan makes a low carb flatbread that's 100 cal and 9 net carbs available at [employer] and [employer] makes 2 sizes of  Low carb whole wheat tortilla  (The large one is 210 cal and 6 net carbs) Avoid "Low fat dressings, as well as Reyne Dumas and [address] dressings They are loaded with sugar!   3 PM/ Mid day  Snack:  Consider  1 ounce of  almonds, walnuts, pistachios, pecans, peanuts,  Macadamia nuts or a nut medley.  Avoid "granola"; the dried cranberries and raisins are loaded with carbohydrates. Mixed nuts as long as there are no raisins,  cranberries or dried fruit.     6 PM  Dinner:     Meat/fowl/fish with a green salad, and either broccoli, cauliflower, green beans, spinach, brussel sprouts or  Lima beans. DO NOT BREAD THE PROTEIN!!      There is a low carb pasta by Dreamfield's that is acceptable and tastes great: only 5 digestible carbs/serving.( All grocery stores but BJs carry it )  Try Kai Levins Angelo's chicken piccata or chicken or eggplant parm over low carb pasta.(Lowes and BJs)   Clifton Custard Sanchez's "Carnitas" (pulled pork, no sauce,  0  carbs) or his beef pot roast to make a dinner burrito (at [employer]J's)  Pesto over low carb pasta (bj's sells a good quality pesto in the center refrigerated section of the deli   Whole wheat pasta is still full of digestible carbs and  Not as low in glycemic index as Dreamfield's.   Brown rice is still rice,  So skip the rice and noodles if you eat [place] or [place] (or at least limit to 1/2 cup)  9 PM snack :   Breyer's "low carb" fudgsicle or  ice cream bar (Carb Smart line), or  Weight Watcher's ice cream bar , or another "no sugar added" ice cream;  a serving of fresh berries/cherries with whipped cream   Cheese or DANNON'S LlGHT N FIT [place] YOGURT  Avoid bananas, pineapple, grapes  and watermelon on a regular basis because they are high in sugar.  THINK OF THEM AS DESSERT  Remember that snack Substitutions should be less than 10 NET carbs per serving and meals < 20 carbs. Remember to subtract fiber grams to get the "net carbs."

## 2012-09-12 NOTE — Assessment & Plan Note (Signed)
Etiology unclear.  No history of trauma or severe osteoporosis by 2013 DEXA.  Palin films ordered

## 2012-09-12 NOTE — Progress Notes (Signed)
Patient ID: Jaime Hall, female   DOB: 09/29/1950, 62 y.o.   MRN: [id number]  Patient Active Problem List  Diagnosis  . Chest pain  . Coronary artery disease  . Other and unspecified hyperlipidemia  . Neuropathy associated with lymphoma  . Edema of both legs  . Hip pain, bilateral    Subjective:  CC:   Chief Complaint  Patient presents with  . Establish Care    HPI:   Jaime Hall is a 62 y.o. female who presents as a new patient to establish primary care with the chief complaint of :  1) fatigue which has been present since her cardiac catheterization and  stent placement in Sept 2013.  2) CAD  .Marland Kitchen  She is s/p  PTCA/stent  By Kirke Corin Sept 2013 for 95% blockage found during eval of chest pain  With positive stress test. Her fatigue is Accompanied by fluid retention, attributed to use of  Gabapentin for management of neuropathy from chemotherapy.   She was diagnosed with Hodgkins lymphoma in 2011 after presenting with paratracheal lymphadenopathy causing dysphagia. CXR led to pulmonology eval and then [facility] for biopsy .  Dr. Sherrlyn Hock treated her with chemo x 8 rounds and 28 rounds of XRT.  Had complete alopecia .  Lost hair everywhere.  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  Started after statin was prescribed.  Dose was reduced and needs follow up lipids  3) Recurrent right inguinal brought on by prolonged standing. Started during pregnancy,  Improved for a few years,  Now worse since she gained weight.  [facility] did  an ultrasound of groin and it was normal .  No radiation, describes pain as a burning  sensation in groin no skin redness. No lateral thigh numbness.    4) Obesity.  Has gained 7 lbs since September.  Not exercising.  Her baseline weight was 125 lbs as a young woman,  Has been 170 lbs for over 20 years now.  Wants to lose weight. 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 Found on colonoscopy by Maryruth Bun i 2006, repeat scope 2010 by Servando Snare,  Wants to See [facility] for colonoscopy since he did her biopsy.     Past Medical History  Diagnosis Date  . Essential hypertension, benign   . Chronic airway obstruction, not elsewhere classified   . Cervicalgia   . Osteoarthritis   . Fibrocystic breast disease   . Polycystic ovarian disease   . History of anemia   . Gestational hypertension   . Hodgkin's lymphoma     s/p radiation and chemo therapy  . Coronary artery disease 02/2012    Abnormal stress test with anterior wall ischemia. Cardiac catheterization showed a 95% stenosis in the proximal LAD bifurcating with a 90% stenosis in first diagonal. Ejection fraction was 45% with anterior wall hypokinesis. She underwent balloon angioplasty to ostial first diagonal and drug-eluting stent placement to proximal LAD with a 3.0 x 15 mm Xience EX drug-eluting stent  . Other and unspecified hyperlipidemia   . Heart murmur   . Heart disease   . History of blood transfusion     Past Surgical History  Procedure Laterality Date  . Bladder sling    . Cardiac catheterization  02/2012    [facility]  .  Coronary angioplasty  02/2012    left/right s/p balloon  . Cholecystectomy  1982  . Transobturator sling N/A 2009    [place]    Family History  Problem Relation Age of Onset  . ALS Father   . Polymyositis Father   . Diabetes Brother   . Cancer Maternal Aunt     breast  . Stroke Maternal Grandmother   . Cancer Maternal Grandfather     prostate  . Stroke Maternal Grandfather     History   Social History  . Marital Status: Married    Spouse Name: N/A    Number of Children: N/A  . Years of Education: N/A   Occupational History  . Not on file.   Social History Main Topics  . Smoking status: Former Smoker -- 1.00 packs/day for 30 years    Types: Cigarettes  . Smokeless tobacco: Not on file  . Alcohol  Use: Yes     Comment: occasional  . Drug Use: No  . Sexually Active: Not on file   Other Topics Concern  . Not on file   Social History Narrative  . No narrative on file   Allergies  Allergen Reactions  . Antifungal (Miconazole Nitrate)   . Sulfa Antibiotics   . Z-Pak (Azithromycin)     Review of Systems:   Patient denies headache, fevers, malaise, unintentional weight loss, skin rash, eye pain, sinus congestion and sinus pain, sore throat, dysphagia,  hemoptysis , cough, dyspnea, wheezing, chest pain, palpitations, orthopnea, edema, abdominal pain, nausea, melena, diarrhea, constipation, flank pain, dysuria, hematuria, urinary  Frequency, nocturia, tingling, seizures,  Focal weakness, Loss of consciousness,  Tremor, insomnia, depression, anxiety, and suicidal ideation.      Objective:  BP 130/66  Pulse 83  Temp(Src) 97.6 F (36.4 C) (Oral)  Resp 18  Ht 5\' 1"  (1.549 m)  Wt 199 lb 12 oz (90.606 kg)  BMI 37.76 kg/m2  SpO2 97%  General appearance: alert, cooperative and appears stated age Ears: normal TM's and external ear canals both ears Throat: lips, mucosa, and tongue normal; teeth and gums normal Neck: no adenopathy, no carotid bruit, supple, symmetrical, trachea midline and thyroid not enlarged, symmetric, no tenderness/mass/nodules Back: symmetric, no curvature. ROM normal. No CVA tenderness. Lungs: clear to auscultation bilaterally Heart: regular rate and rhythm, S1, S2 normal, no murmur, click, rub or gallop Abdomen: soft, non-tender; bowel sounds normal; no masses,  no organomegaly Pulses: 2+ and symmetric Skin: Skin color, texture, turgor normal. No rashes or lesions Lymph nodes: Cervical, supraclavicular, and axillary nodes normal.  Assessment and Plan:  Other and unspecified hyperlipidemia Due for repeat lipids after reduction of statin dose. Return for fasting lipids next week.   Edema of both legs Unclear if fluid retention is due to systolid or  diastolic dysfunction or side effect of gabapentin.  Stopping medication ,  Furosemide x 3 days maximum.  Repeat ECHO if recurrent    Hip pain, bilateral Etiology unclear.  No history of trauma or severe osteoporosis by 2013 DEXA.  Palin films ordered  Hx of adenomatous colonic polyps Needing  3 year follow up for colonoscopy.  Sending to Dr. Evette Cristal.  Neuropathy associated with lymphoma Stopping gabapentin due to lack of response and development of edema   History of hodgkin's lymphoma In remission s/o chemo/XRT.  Follow up  With Pandit.    Updated Medication List Outpatient Encounter Prescriptions as of 09/12/2012  Medication Sig Dispense Refill  . aspirin 81 MG tablet Take 81  mg by mouth daily.      Marland Kitchen atorvastatin (LIPITOR) 10 MG tablet Take 1 tablet (10 mg total) by mouth daily.  31 tablet  6  . CALCIUM-MAGNESIUM-ZINC PO Calcium 1000mg / magnesium 400mg / zinc 25mg  pt takes 1 tablet daily.      . clopidogrel (PLAVIX) 75 MG tablet Take 1 tablet (75 mg total) by mouth daily.  30 tablet  11  . Cyanocobalamin (VITAMIN B 12 PO) Take by mouth every 30 (thirty) days. Injection weekly.      Marland Kitchen gabapentin (NEURONTIN) 300 MG capsule Take 600 mg by mouth at bedtime.       . metoprolol tartrate (LOPRESSOR) 25 MG tablet Take 1 tablet (25 mg total) by mouth 2 (two) times daily.  60 tablet  3  . pyridOXINE (VITAMIN B-6) 100 MG tablet Take 100 mg by mouth daily.       No facility-administered encounter medications on file as of 09/12/2012.

## 2012-09-12 NOTE — Assessment & Plan Note (Signed)
Stopping gabapentin due to lack of response and development of edema

## 2012-09-12 NOTE — Assessment & Plan Note (Addendum)
Due for repeat lipids after reduction of statin dose. Return for fasting lipids next week.

## 2012-09-12 NOTE — Assessment & Plan Note (Signed)
Needing  3 year follow up for colonoscopy.  Sending to Dr. Evette Cristal.

## 2012-09-12 NOTE — Assessment & Plan Note (Signed)
In remission s/o chemo/XRT.  Follow up  With Pandit.

## 2012-09-12 NOTE — Assessment & Plan Note (Addendum)
Unclear if fluid retention is due to systolid or diastolic dysfunction or side effect of gabapentin.  Stopping medication ,  Furosemide x 3 days maximum.  Repeat ECHO if recurrent

## 2012-09-13 ENCOUNTER — Telehealth: Payer: Self-pay | Admitting: Emergency Medicine

## 2012-09-13 ENCOUNTER — Encounter: Payer: Self-pay | Admitting: Internal Medicine

## 2012-09-13 MED ORDER — FUROSEMIDE 20 MG PO TABS: ORAL_TABLET | ORAL | Status: DC

## 2012-09-13 NOTE — Telephone Encounter (Signed)
rx for diuretic sent to pharmacy

## 2012-09-13 NOTE — Telephone Encounter (Signed)
I called pt to make her aware of the apt I made her. The patient is concerned that she was supposed a script for a diuretic. Please advise.

## 2012-09-22 ENCOUNTER — Ambulatory Visit (INDEPENDENT_AMBULATORY_CARE_PROVIDER_SITE_OTHER): Payer: BC Managed Care – PPO | Admitting: General Surgery

## 2012-09-22 ENCOUNTER — Encounter: Payer: Self-pay | Admitting: General Surgery

## 2012-09-22 VITALS — BP 148/74 | HR 70 | Resp 14 | Ht 62.0 in | Wt 200.0 lb

## 2012-09-22 DIAGNOSIS — Z8571 Personal history of Hodgkin lymphoma: Secondary | ICD-10-CM

## 2012-09-22 DIAGNOSIS — Z86010 Personal history of colonic polyps: Secondary | ICD-10-CM

## 2012-09-22 NOTE — Progress Notes (Signed)
Patient ID: Jaime Hall, female   DOB: 05/14/1951, 62 y.o.   MRN: [id number]  Chief Complaint  Patient presents with  . Colonoscopy    HPI Jaime Hall is a 62 y.o. female here here for her screening colonoscopy . Had history of colon polyps. Last colonoscopy was in 2009, one polyp removed then-adenomatous. HPI  Past Medical History  Diagnosis Date  . Essential hypertension, benign   . Chronic airway obstruction, not elsewhere classified   . Cervicalgia   . Osteoarthritis   . Fibrocystic breast disease   . Polycystic ovarian disease   . History of anemia   . Gestational hypertension   . Hodgkin's lymphoma     s/p radiation and chemo therapy  . Coronary artery disease 02/2012    Abnormal stress test with anterior wall ischemia. Cardiac catheterization showed a 95% stenosis in the proximal LAD bifurcating with a 90% stenosis in first diagonal. Ejection fraction was 45% with anterior wall hypokinesis. She underwent balloon angioplasty to ostial first diagonal and drug-eluting stent placement to proximal LAD with a 3.0 x 15 mm Xience EX drug-eluting stent  . Other and unspecified hyperlipidemia   . Heart murmur   . Heart disease   . History of blood transfusion     Past Surgical History  Procedure Laterality Date  . Bladder sling    . Cardiac catheterization  02/2012    [facility]  . Coronary angioplasty  02/2012    left/right s/p balloon  . Cholecystectomy  1982  . Transobturator sling N/A 2009    [place]  . Heart stent'  2013    Family History  Problem Relation Age of Onset  . ALS Father   . Polymyositis Father   . Diabetes Brother   . Cancer Maternal Aunt     breast  . Stroke Maternal Grandmother   . Cancer Maternal Grandfather     prostate  . Stroke Maternal Grandfather     Social History History  Substance Use Topics  . Smoking status: Former Smoker -- 1.00 packs/day for 30 years    Types: Cigarettes  . Smokeless tobacco: Not on file  . Alcohol Use: Yes      Comment: occasional    Allergies  Allergen Reactions  . Antifungal (Miconazole Nitrate)   . Sulfa Antibiotics   . Z-Pak (Azithromycin)     Current Outpatient Prescriptions  Medication Sig Dispense Refill  . aspirin 81 MG tablet Take 81 mg by mouth daily.      Marland Kitchen atorvastatin (LIPITOR) 10 MG tablet Take 1 tablet (10 mg total) by mouth daily.  31 tablet  6  . CALCIUM-MAGNESIUM-ZINC PO Calcium 1000mg / magnesium 400mg / zinc 25mg  pt takes 1 tablet daily.      . clopidogrel (PLAVIX) 75 MG tablet Take 1 tablet (75 mg total) by mouth daily.  30 tablet  11  . Cyanocobalamin (VITAMIN B 12 PO) Take by mouth every 30 (thirty) days. Injection weekly.      Marland Kitchen gabapentin (NEURONTIN) 300 MG capsule Take 600 mg by mouth at bedtime.       . metoprolol tartrate (LOPRESSOR) 25 MG tablet Take 1 tablet (25 mg total) by mouth 2 (two) times daily.  60 tablet  3  . pyridOXINE (VITAMIN B-6) 100 MG tablet Take 100 mg by mouth daily.      . furosemide (LASIX) 20 MG tablet One tablet daily in the morning as needed for fluid retention  30 tablet  0   No current facility-administered medications  for this visit.    Review of Systems Review of Systems  Constitutional: Negative.   Respiratory: Negative.   Cardiovascular: Negative.     Blood pressure 148/74, pulse 70, resp. rate 14, height 5\' 2"  (1.575 m), weight 200 lb (90.719 kg).  Physical Exam Physical ExamNot performed today. Pt is o yr. Plavix and has to stay on it for one yr post stent-that ends in September this yr. Data Reviewed Prior colonoscopyreports reviewed.  Assessment    History of colon polyps     Plan    Defer colonoscopy  till September when she can come off Plavix safely         Brinklee Cisse G 09/23/2012, 6:25 AM

## 2012-09-22 NOTE — Patient Instructions (Addendum)
To return in September this yr.for scheduling colonoscopy

## 2012-09-23 ENCOUNTER — Other Ambulatory Visit (INDEPENDENT_AMBULATORY_CARE_PROVIDER_SITE_OTHER): Payer: BC Managed Care – PPO

## 2012-09-23 ENCOUNTER — Encounter: Payer: Self-pay | Admitting: Internal Medicine

## 2012-09-23 ENCOUNTER — Encounter: Payer: Self-pay | Admitting: General Surgery

## 2012-09-23 DIAGNOSIS — C8589 Other specified types of non-Hodgkin lymphoma, extranodal and solid organ sites: Secondary | ICD-10-CM

## 2012-09-23 DIAGNOSIS — E785 Hyperlipidemia, unspecified: Secondary | ICD-10-CM

## 2012-09-23 DIAGNOSIS — R5381 Other malaise: Secondary | ICD-10-CM

## 2012-09-23 DIAGNOSIS — C8590 Non-Hodgkin lymphoma, unspecified, unspecified site: Secondary | ICD-10-CM

## 2012-09-23 DIAGNOSIS — D849 Immunodeficiency, unspecified: Secondary | ICD-10-CM

## 2012-09-23 DIAGNOSIS — G63 Polyneuropathy in diseases classified elsewhere: Secondary | ICD-10-CM

## 2012-09-23 DIAGNOSIS — R5383 Other fatigue: Secondary | ICD-10-CM

## 2012-09-23 DIAGNOSIS — R609 Edema, unspecified: Secondary | ICD-10-CM

## 2012-09-23 LAB — COMPREHENSIVE METABOLIC PANEL
ALT: 24 U/L (ref 0–35)
AST: 25 U/L (ref 0–37)
Albumin: 4 g/dL (ref 3.5–5.2)
Alkaline Phosphatase: 61 U/L (ref 39–117)
BUN: 17 mg/dL (ref 6–23)
CO2: 26 mEq/L (ref 19–32)
Calcium: 9 mg/dL (ref 8.4–10.5)
Chloride: 103 mEq/L (ref 96–112)
Creatinine, Ser: 1 mg/dL (ref 0.4–1.2)
GFR: 60.22 mL/min (ref >60.00)
Glucose, Bld: 101 mg/dL — ABNORMAL HIGH (ref 70–99)
Potassium: 4.3 mEq/L (ref 3.5–5.1)
Sodium: 137 mEq/L (ref 135–145)
Total Bilirubin: 1.5 mg/dL — ABNORMAL HIGH (ref 0.3–1.2)
Total Protein: 6.9 g/dL (ref 6.0–8.3)

## 2012-09-23 LAB — CBC WITH DIFFERENTIAL/PLATELET
Basophils Absolute: 0 10*3/uL (ref 0.0–0.1)
Basophils Relative: 0.4 % (ref 0.0–3.0)
Eosinophils Absolute: 0.1 10*3/uL (ref 0.0–0.7)
Eosinophils Relative: 0.9 % (ref 0.0–5.0)
HCT: 40.9 % (ref 36.0–46.0)
Hemoglobin: 13.6 g/dL (ref 12.0–15.0)
Lymphocytes Relative: 19.8 % (ref 12.0–46.0)
Lymphs Abs: 1.2 10*3/uL (ref 0.7–4.0)
MCHC: 33.1 g/dL (ref 30.0–36.0)
MCV: 88.8 fl (ref 78.0–100.0)
Monocytes Absolute: 0.4 10*3/uL (ref 0.1–1.0)
Monocytes Relative: 7.1 % (ref 3.0–12.0)
Neutro Abs: 4.5 10*3/uL (ref 1.4–7.7)
Neutrophils Relative %: 71.8 % (ref 43.0–77.0)
Platelets: 215 10*3/uL (ref 150.0–400.0)
RBC: 4.61 Mil/uL (ref 3.87–5.11)
RDW: 14.2 % (ref 11.5–14.6)
WBC: 6.3 10*3/uL (ref 4.5–10.5)

## 2012-09-23 LAB — LIPID PANEL
Cholesterol: 107 mg/dL (ref 0–200)
HDL: 43 mg/dL (ref >39.00)
LDL Cholesterol: 45 mg/dL (ref 0–99)
Total CHOL/HDL Ratio: 2
Triglycerides: 96 mg/dL (ref 0.0–149.0)
VLDL: 19.2 mg/dL (ref 0.0–40.0)

## 2012-09-23 LAB — MICROALBUMIN / CREATININE URINE RATIO
Creatinine,U: 106 mg/dL
Microalb Creat Ratio: 0.8 mg/g (ref 0.0–30.0)
Microalb, Ur: 0.8 mg/dL (ref 0.0–1.9)

## 2012-09-23 LAB — BRAIN NATRIURETIC PEPTIDE: Pro B Natriuretic peptide (BNP): 99 pg/mL (ref 0.0–100.0)

## 2012-09-23 LAB — TSH: TSH: 3.79 u[IU]/mL (ref 0.35–5.50)

## 2012-09-25 ENCOUNTER — Encounter: Payer: Self-pay | Admitting: Internal Medicine

## 2012-09-25 NOTE — Addendum Note (Signed)
Addended by: Sherlene Shams on: 09/25/2012 07:28 AM   Modules accepted: Orders

## 2012-10-11 ENCOUNTER — Other Ambulatory Visit (INDEPENDENT_AMBULATORY_CARE_PROVIDER_SITE_OTHER): Payer: BC Managed Care – PPO

## 2012-10-11 DIAGNOSIS — R17 Unspecified jaundice: Secondary | ICD-10-CM

## 2012-10-12 ENCOUNTER — Encounter: Payer: Self-pay | Admitting: Internal Medicine

## 2012-10-12 LAB — HEPATIC FUNCTION PANEL
ALT: 22 U/L (ref 0–35)
AST: 23 U/L (ref 0–37)
Albumin: 3.8 g/dL (ref 3.5–5.2)
Alkaline Phosphatase: 57 U/L (ref 39–117)
Bilirubin, Direct: 0.2 mg/dL (ref 0.0–0.3)
Total Bilirubin: 1.2 mg/dL (ref 0.3–1.2)
Total Protein: 6.6 g/dL (ref 6.0–8.3)

## 2012-10-13 ENCOUNTER — Encounter: Payer: Self-pay | Admitting: Internal Medicine

## 2012-10-17 NOTE — Telephone Encounter (Signed)
Updated TD in chart as requested.

## 2012-10-20 ENCOUNTER — Encounter: Payer: Self-pay | Admitting: Internal Medicine

## 2012-10-24 ENCOUNTER — Encounter: Payer: Self-pay | Admitting: Cardiovascular Disease

## 2012-10-27 ENCOUNTER — Other Ambulatory Visit: Payer: Self-pay

## 2012-10-27 MED ORDER — METOPROLOL TARTRATE 25 MG PO TABS: 25.0000 mg | ORAL_TABLET | Freq: Two times a day (BID) | ORAL | Status: DC

## 2012-11-08 ENCOUNTER — Encounter: Payer: Self-pay | Admitting: Internal Medicine

## 2012-11-16 ENCOUNTER — Ambulatory Visit: Payer: Self-pay | Admitting: Internal Medicine

## 2012-11-17 ENCOUNTER — Ambulatory Visit: Payer: Self-pay | Admitting: Internal Medicine

## 2012-11-18 LAB — BASIC METABOLIC PANEL
Anion Gap: 9 (ref 7–16)
BUN: 17 mg/dL (ref 7–18)
Calcium, Total: 9.5 mg/dL (ref 8.5–10.1)
Chloride: 106 mmol/L (ref 98–107)
Co2: 26 mmol/L (ref 21–32)
Creatinine: 1.32 mg/dL — ABNORMAL HIGH (ref 0.60–1.30)
EGFR (African American): 50 — ABNORMAL LOW
EGFR (Non-African Amer.): 43 — ABNORMAL LOW
Glucose: 119 mg/dL — ABNORMAL HIGH (ref 65–99)
Osmolality: 284 (ref 275–301)
Potassium: 4.2 mmol/L (ref 3.5–5.1)
Sodium: 141 mmol/L (ref 136–145)

## 2012-11-18 LAB — HEPATIC FUNCTION PANEL A (ARMC)
Albumin: 3.8 g/dL (ref 3.4–5.0)
Alkaline Phosphatase: 95 U/L (ref 50–136)
Bilirubin, Direct: 0.2 mg/dL (ref 0.00–0.20)
Bilirubin,Total: 1 mg/dL (ref 0.2–1.0)
SGOT(AST): 18 U/L (ref 15–37)
SGPT (ALT): 32 U/L (ref 12–78)
Total Protein: 7 g/dL (ref 6.4–8.2)

## 2012-11-18 LAB — CBC CANCER CENTER
Basophil #: 0.1 x10 3/mm (ref 0.0–0.1)
Basophil %: 0.7 %
Eosinophil #: 0.1 x10 3/mm (ref 0.0–0.7)
Eosinophil %: 1.2 %
HCT: 41.4 % (ref 35.0–47.0)
HGB: 14.2 g/dL (ref 12.0–16.0)
Lymphocyte #: 1.6 x10 3/mm (ref 1.0–3.6)
Lymphocyte %: 22.2 %
MCH: 30.6 pg (ref 26.0–34.0)
MCHC: 34.3 g/dL (ref 32.0–36.0)
MCV: 89 fL (ref 80–100)
Monocyte #: 0.5 x10 3/mm (ref 0.2–0.9)
Monocyte %: 6.3 %
Neutrophil #: 5 x10 3/mm (ref 1.4–6.5)
Neutrophil %: 69.6 %
Platelet: 214 x10 3/mm (ref 150–440)
RBC: 4.65 10*6/uL (ref 3.80–5.20)
RDW: 13.9 % (ref 11.5–14.5)
WBC: 7.2 x10 3/mm (ref 3.6–11.0)

## 2012-11-18 LAB — LACTATE DEHYDROGENASE: LDH: 182 U/L (ref 81–246)

## 2012-12-12 ENCOUNTER — Encounter: Payer: Self-pay | Admitting: Cardiovascular Disease

## 2012-12-12 ENCOUNTER — Ambulatory Visit (INDEPENDENT_AMBULATORY_CARE_PROVIDER_SITE_OTHER): Payer: BC Managed Care – PPO | Admitting: Cardiovascular Disease

## 2012-12-12 VITALS — BP 120/84 | HR 65 | Ht 61.0 in | Wt 193.8 lb

## 2012-12-12 DIAGNOSIS — I2510 Atherosclerotic heart disease of native coronary artery without angina pectoris: Secondary | ICD-10-CM

## 2012-12-12 DIAGNOSIS — E785 Hyperlipidemia, unspecified: Secondary | ICD-10-CM

## 2012-12-12 DIAGNOSIS — R0602 Shortness of breath: Secondary | ICD-10-CM

## 2012-12-12 DIAGNOSIS — R0789 Other chest pain: Secondary | ICD-10-CM

## 2012-12-12 NOTE — Patient Instructions (Addendum)
Continue same medications.  Follow up in 6 months.  

## 2012-12-12 NOTE — Assessment & Plan Note (Signed)
She is doing well at this point with no recurrent symptoms of angina. Continue dual antiplatelet therapy at least until September of 2014. After that, Plavix can be discontinued.

## 2012-12-12 NOTE — Assessment & Plan Note (Signed)
Lab Results  Component Value Date   CHOL 107 09/23/2012   HDL 43.00 09/23/2012   LDLCALC 45 09/23/2012   TRIG 96.0 09/23/2012   CHOLHDL 2 09/23/2012   Her lipid profile optimal on current dose of atorvastatin which should be continued indefinitely.

## 2012-12-12 NOTE — Progress Notes (Signed)
HPI  This is a 62 year old female who is here today for a followup visit regarding coronary artery disease.  She has history of Hodgkin's lymphoma treated with radiation and chemotherapy to the chest area and has been in remission since 2011. She does have pulmonary nodules which are being followed regularly every 6 months. She is a former smoker. Stress echocardiogram in 02/2012 showed evidence of severe anterior wall ischemia. She underwent cardiac catheterization  which showed a 95% stenosis in the proximal LAD as well as 90% ostial first diagonal stenosis. This was a bifurcation stenosis. She underwent a complex but successful PCI with balloon angioplasty of first diagonal  as well as drug-eluting stent placement to the proximal LAD. She has been doing well since then. She denies any recurrent chest pain. She did have myalgia with higher dose of atorvastatin but currently she is tolerating 10 mg daily.  Allergies  Allergen Reactions  . Antifungal (Miconazole Nitrate)   . Sulfa Antibiotics   . Z-Pak (Azithromycin)      Current Outpatient Prescriptions on File Prior to Visit  Medication Sig Dispense Refill  . aspirin 81 MG tablet Take 81 mg by mouth daily.      Marland Kitchen atorvastatin (LIPITOR) 10 MG tablet Take 1 tablet (10 mg total) by mouth daily.  31 tablet  6  . CALCIUM-MAGNESIUM-ZINC PO Calcium 1000mg / magnesium 400mg / zinc 25mg  pt takes 1 tablet daily.      . clopidogrel (PLAVIX) 75 MG tablet Take 1 tablet (75 mg total) by mouth daily.  30 tablet  11  . Cyanocobalamin (VITAMIN B 12 PO) Take by mouth every 30 (thirty) days. Injection weekly.      . metoprolol tartrate (LOPRESSOR) 25 MG tablet Take 1 tablet (25 mg total) by mouth 2 (two) times daily.  60 tablet  3  . pyridOXINE (VITAMIN B-6) 100 MG tablet Take 100 mg by mouth daily.       No current facility-administered medications on file prior to visit.     Past Medical History  Diagnosis Date  . Essential hypertension, benign   .  Chronic airway obstruction, not elsewhere classified   . Cervicalgia   . Osteoarthritis   . Fibrocystic breast disease   . Polycystic ovarian disease   . History of anemia   . Gestational hypertension   . Hodgkin's lymphoma     s/p radiation and chemo therapy  . Coronary artery disease 02/2012    Abnormal stress test with anterior wall ischemia. Cardiac catheterization showed a 95% stenosis in the proximal LAD bifurcating with a 90% stenosis in first diagonal. Ejection fraction was 45% with anterior wall hypokinesis. She underwent balloon angioplasty to ostial first diagonal and drug-eluting stent placement to proximal LAD with a 3.0 x 15 mm Xience EX drug-eluting stent  . Other and unspecified hyperlipidemia   . Heart murmur   . Heart disease   . History of blood transfusion      Past Surgical History  Procedure Laterality Date  . Bladder sling    . Cardiac catheterization  02/2012    [facility]  . Coronary angioplasty  02/2012    left/right s/p balloon  . Cholecystectomy  1982  . Transobturator sling N/A 2009    [place]  . Heart stent'  2013     Family History  Problem Relation Age of Onset  . ALS Father   . Polymyositis Father   . Diabetes Brother   . Cancer Maternal Aunt  breast  . Stroke Maternal Grandmother   . Cancer Maternal Grandfather     prostate  . Stroke Maternal Grandfather      History   Social History  . Marital Status: Married    Spouse Name: N/A    Number of Children: N/A  . Years of Education: N/A   Occupational History  . Not on file.   Social History Main Topics  . Smoking status: Former Smoker -- 1.00 packs/day for 30 years    Types: Cigarettes  . Smokeless tobacco: Not on file  . Alcohol Use: Yes     Comment: occasional  . Drug Use: No  . Sexually Active: Not on file   Other Topics Concern  . Not on file   Social History Narrative  . No narrative on file     PHYSICAL EXAM   BP 120/84  Pulse 65  Ht 5\' 1"  (1.549 m)  Wt  193 lb 12 oz (87.884 kg)  BMI 36.63 kg/m2  Constitutional: She is oriented to person, place, and time. She appears well-developed and well-nourished. No distress.  HENT: No nasal discharge.  Head: Normocephalic and atraumatic.  Eyes: Pupils are equal and round. Right eye exhibits no discharge. Left eye exhibits no discharge.  Neck: Normal range of motion. Neck supple. No JVD present. No thyromegaly present.  Cardiovascular: Normal rate, regular rhythm, normal heart sounds. Exam reveals no gallop and no friction rub. No murmur heard.  Pulmonary/Chest: Effort normal and breath sounds normal. No stridor. No respiratory distress. She has no wheezes. She has no rales. She exhibits no tenderness.  Abdominal: Soft. Bowel sounds are normal. She exhibits no distension. There is no tenderness. There is no rebound and no guarding.  Musculoskeletal: Normal range of motion. She exhibits no edema and no tenderness.  Neurological: She is alert and oriented to person, place, and time. Coordination normal.  Skin: Skin is warm and dry. No rash noted. She is not diaphoretic. No erythema. No pallor.  Psychiatric: She has a normal mood and affect. Her behavior is normal. Judgment and thought content normal.   [id number]  Rhythm  Low voltage in precordial leads.   ABNORMAL   ASSESSMENT AND PLAN

## 2012-12-13 ENCOUNTER — Ambulatory Visit: Payer: Self-pay | Admitting: Internal Medicine

## 2012-12-14 ENCOUNTER — Ambulatory Visit (INDEPENDENT_AMBULATORY_CARE_PROVIDER_SITE_OTHER): Payer: BC Managed Care – PPO

## 2012-12-14 DIAGNOSIS — Z139 Encounter for screening, unspecified: Secondary | ICD-10-CM

## 2012-12-14 DIAGNOSIS — Z0000 Encounter for general adult medical examination without abnormal findings: Secondary | ICD-10-CM

## 2012-12-28 ENCOUNTER — Ambulatory Visit (INDEPENDENT_AMBULATORY_CARE_PROVIDER_SITE_OTHER): Payer: BC Managed Care – PPO | Admitting: Internal Medicine

## 2012-12-28 ENCOUNTER — Encounter: Payer: Self-pay | Admitting: Internal Medicine

## 2012-12-28 VITALS — BP 110/78 | HR 66 | Temp 97.9°F | Resp 14 | Ht 63.0 in | Wt 193.8 lb

## 2012-12-28 DIAGNOSIS — Z86010 Personal history of colonic polyps: Secondary | ICD-10-CM

## 2012-12-28 DIAGNOSIS — E785 Hyperlipidemia, unspecified: Secondary | ICD-10-CM

## 2012-12-28 DIAGNOSIS — E669 Obesity, unspecified: Secondary | ICD-10-CM

## 2012-12-28 DIAGNOSIS — M722 Plantar fascial fibromatosis: Secondary | ICD-10-CM | POA: Insufficient documentation

## 2012-12-28 DIAGNOSIS — C8590 Non-Hodgkin lymphoma, unspecified, unspecified site: Secondary | ICD-10-CM

## 2012-12-28 DIAGNOSIS — Z0000 Encounter for general adult medical examination without abnormal findings: Secondary | ICD-10-CM

## 2012-12-28 DIAGNOSIS — C8589 Other specified types of non-Hodgkin lymphoma, extranodal and solid organ sites: Secondary | ICD-10-CM

## 2012-12-28 DIAGNOSIS — Z8709 Personal history of other diseases of the respiratory system: Secondary | ICD-10-CM

## 2012-12-28 DIAGNOSIS — Z87898 Personal history of other specified conditions: Secondary | ICD-10-CM | POA: Insufficient documentation

## 2012-12-28 DIAGNOSIS — G63 Polyneuropathy in diseases classified elsewhere: Secondary | ICD-10-CM

## 2012-12-28 DIAGNOSIS — D849 Immunodeficiency, unspecified: Secondary | ICD-10-CM

## 2012-12-28 DIAGNOSIS — Z8571 Personal history of Hodgkin lymphoma: Secondary | ICD-10-CM

## 2012-12-28 NOTE — Progress Notes (Signed)
Patient ID: Jaime Hall, female   DOB: 04-Apr-1951, 62 y.o.   MRN: [id number]   Subjective:     Jaime Hall is a 62 y.o. female and is here for a comprehensive physical exam. The patient reports that she feels well. Her peripheral neuropathy has been noticeably  improving over the last  3 months,.  She is now 3 yrs out from chemotherapy for non-Hodgkin's lymphoma, diagnosed and treated in 2011.   she denies chest pain .   she had a Vascular /ultrasound evaluation  Of carotids aorta, femorals by Arida's office w and the results were reportedly normal.  She has been exercising daily doing water aerobics.  she has lost 7 pounds since April.    History   Social History  . Marital Status: Married    Spouse Name: N/A    Number of Children: N/A  . Years of Education: N/A   Occupational History  . Not on file.   Social History Main Topics  . Smoking status: Former Smoker -- 1.00 packs/day for 30 years    Types: Cigarettes  . Smokeless tobacco: Not on file  . Alcohol Use: Yes     Comment: occasional  . Drug Use: No  . Sexually Active: Not on file   Other Topics Concern  . Not on file   Social History Narrative  . No narrative on file   Health Maintenance  Topic Date Due  . Zostavax  09/30/2009  . Influenza Vaccine  02/13/2013  . Mammogram  06/14/2014  . Pap Smear  05/01/2015  . Colonoscopy  09/13/2018  . Tetanus/tdap  10/04/2021    The following portions of the patient's history were reviewed and updated as appropriate: allergies, current medications, past family history, past medical history, past social history, past surgical history and problem list.  Review of Systems A comprehensive review of systems was negative.   Objective:   BP 110/78  Pulse 66  Temp(Src) 97.9 F (36.6 C) (Oral)  Resp 14  Ht 5\' 3"  (1.6 m)  Wt 193 lb 12 oz (87.884 kg)  BMI 34.33 kg/m2  SpO2 97%  General appearance: alert, cooperative and appears stated age Ears: normal TM's and  external ear canals both ears Throat: lips, mucosa, and tongue normal; teeth and gums normal Neck: no adenopathy, no carotid bruit, supple, symmetrical, trachea midline and thyroid not enlarged, symmetric, no tenderness/mass/nodules Back: symmetric, no curvature. ROM normal. No CVA tenderness. Lungs: clear to auscultation bilaterally Heart: regular rate and rhythm, S1, S2 normal, no murmur, click, rub or gallop Abdomen: soft, non-tender; bowel sounds normal; no masses,  no organomegaly Pulses: 2+ and symmetric Skin: Skin color, texture, turgor normal. No rashes or lesions Lymph nodes: Cervical, supraclavicular, and axillary nodes normal.   Assessment:      Hx of adenomatous colonic polyps  5 yr follow up colonoscopy with Servando Snare  was done in 2010.   Next one due in 2015 per [facility]   Neuropathy associated with lymphoma Improved with time. Secondary to chemotherapy for non-Hodgkin's lymphoma. She's noticed a considerable improvement over the last 3 months. She is no longer taking Neurontin.  Plantar fasciitis of right foot She had an injection by Dr. Theo Dills several months ago which improved her pain. She currently has no pain in the she spends greater than 6 hours on her feet. She is wearing orthopedic flip-flops and a plantar fasciitis but at night.  History of hodgkin's lymphoma She has been in remission since 2012  Hx of multiple  pulmonary nodules Her pulmonary nodules or followed with serial CTs ordered by Dr. Sherrlyn Hock her oncologist.  Other and unspecified hyperlipidemia LDL was 45 and liver function tests normal in April. Continue lipitor.  Routine general medical examination at a health care facility Annual comprehensive exam was done including breast exam was done . All screenings have been addressed .   Obesity (BMI 30-39.9) I have addressed  BMI, congratulated her on her 9 pound weight loss and reviewed and recommended a low glycemic index diet utilizing smaller more frequent  meals to increase metabolism.  Patient is exercising  a minimum of 5 days per week.     Updated Medication List Outpatient Encounter Prescriptions as of 12/28/2012  Medication Sig Dispense Refill  . aspirin 81 MG tablet Take 81 mg by mouth daily.      Marland Kitchen atorvastatin (LIPITOR) 10 MG tablet Take 1 tablet (10 mg total) by mouth daily.  31 tablet  6  . BIOTIN PO Take by mouth daily.      Marland Kitchen CALCIUM-MAGNESIUM-ZINC PO Calcium 1000mg / magnesium 400mg / zinc 25mg  pt takes 1 tablet daily.      . Cholecalciferol (VITAMIN D-3) 1000 UNITS CAPS Take 1 capsule by mouth daily.      . clopidogrel (PLAVIX) 75 MG tablet Take 1 tablet (75 mg total) by mouth daily.  30 tablet  11  . Cyanocobalamin (VITAMIN B 12 PO) Take by mouth every 30 (thirty) days. Injection weekly.      . metoprolol tartrate (LOPRESSOR) 25 MG tablet Take 1 tablet (25 mg total) by mouth 2 (two) times daily.  60 tablet  3  . pyridOXINE (VITAMIN B-6) 100 MG tablet Take 100 mg by mouth daily.       No facility-administered encounter medications on file as of 12/28/2012.

## 2012-12-28 NOTE — Assessment & Plan Note (Signed)
5 yr follow up colonoscopy with Servando Snare  was done in 2010.   Next one due in 2015 per [facility]

## 2012-12-28 NOTE — Patient Instructions (Addendum)
Congratulations on your weight loss!  I think a 50 lb wt loss goal is achievable over time. (4 lbs/month is a safe rate)  This is  One version of a  "Low GI"  Diet:  It will still lower your blood sugars and allow you to lose 4 to 8  lbs  per month if you follow it carefully.  Your goal with exercise is a minimum of 30 minutes of aerobic exercise 5 days per week (Walking does not count once it becomes easy!)    All of the foods can be found at grocery stores and in bulk at [employer].  The Atkins protein bars and shakes are available in more varieties at Target, WalMart and [employer]'s Foods.     7 AM Breakfast:  Choose from the following:  Low carbohydrate Protein  Shakes (I recommend the EAS AdvantEdge "Carb Control" shakes  Or the low carb shakes by Atkins.    2.5 carbs   Arnold's "Sandwhich Thin"toasted  w/ peanut butter (no jelly: about 20 net carbs  "Bagel Thin" with cream cheese and salmon: about 20 carbs   a scrambled egg/bacon/cheese burrito made with Mission's "carb balance" whole wheat tortilla  (about 10 net carbs )   Avoid cereal and bananas, oatmeal and cream of wheat and grits. They are loaded with carbohydrates!   10 AM: high protein snack  Protein bar by Atkins (the snack size, under 200 cal, usually < 6 net carbs).    A stick of cheese:  Around 1 carb,  100 cal     Dannon Light n Fit [place] Yogurt  (80 cal, 8 carbs)  Other so called "protein bars" and Greek yogurts tend to be loaded with carbohydrates.  Remember, in food advertising, the word "energy" is synonymous for " carbohydrate."  Lunch:   A Sandwich using the bread choices listed, Can use any  Eggs,  lunchmeat, grilled meat or canned tuna), avocado, regular mayo/mustard  and cheese.  A Salad using blue cheese, ranch,  Goddess or vinagrette,  No croutons or "confetti" and no "candied nuts" but regular nuts OK.   No pretzels or chips.  Pickles and miniature sweet peppers are a good low carb alternative that provide a  "crunch"  The bread is the only source of carbohydrate in a sandwich and  can be decreased by trying some of these alternatives to traditional loaf bread  Joseph's makes a pita bread and a flat bread that are 50 cal and 4 net carbs available at BJs and WalMart.  This can be toasted to use with hummous as well  Toufayan makes a low carb flatbread that's 100 cal and 9 net carbs available at [employer] and [employer] makes 2 sizes of  Low carb whole wheat tortilla  (The large one is 210 cal and 6 net carbs) Avoid "Low fat dressings, as well as Reyne Dumas and [address] dressings They are loaded with sugar!   3 PM/ Mid day  Snack:  Consider  1 ounce of  almonds, walnuts, pistachios, pecans, peanuts,  Macadamia nuts or a nut medley.  Avoid "granola"; the dried cranberries and raisins are loaded with carbohydrates. Mixed nuts as long as there are no raisins,  cranberries or dried fruit.     6 PM  Dinner:     Meat/fowl/fish with a green salad, and either broccoli, cauliflower, green beans, spinach, brussel sprouts or  Lima beans. DO NOT BREAD THE PROTEIN!!  There is a low carb pasta by Dreamfield's that is acceptable and tastes great: only 5 digestible carbs/serving.( All grocery stores but BJs carry it )  Try Hurley Cisco Angelo's chicken piccata or chicken or eggplant parm over low carb pasta.(Lowes and BJs)   Marjory Lies Sanchez's "Carnitas" (pulled pork, no sauce,  0 carbs) or his beef pot roast to make a dinner burrito (at BJ's)  Pesto over low carb pasta (bj's sells a good quality pesto in the center refrigerated section of the deli   Whole wheat pasta is still full of digestible carbs and  Not as low in glycemic index as Dreamfield's.   Brown rice is still rice,  So skip the rice and noodles if you eat [place] or [place] (or at least limit to 1/2 cup)  9 PM snack :   Breyer's "low carb" fudgsicle or  ice cream bar (Carb Smart line), or  Weight Watcher's ice cream bar , or another "no sugar added"  ice cream;  a serving of fresh berries/cherries with whipped cream   Cheese or DANNON'S LlG[employer] [place] YOGURT  Avoid bananas, pineapple, grapes  and watermelon on a regular basis because they are high in sugar.  THINK OF THEM AS DESSERT  Remember that snack Substitutions should be less than 10 NET carbs per serving and meals < 20 carbs. Remember to subtract fiber grams to get the "net carbs."

## 2012-12-30 ENCOUNTER — Encounter: Payer: Self-pay | Admitting: Internal Medicine

## 2012-12-30 DIAGNOSIS — Z0000 Encounter for general adult medical examination without abnormal findings: Secondary | ICD-10-CM | POA: Insufficient documentation

## 2012-12-30 DIAGNOSIS — E669 Obesity, unspecified: Secondary | ICD-10-CM | POA: Insufficient documentation

## 2012-12-30 NOTE — Assessment & Plan Note (Signed)
Improved with time. Secondary to chemotherapy for non-Hodgkin's lymphoma. She's noticed a considerable improvement over the last 3 months. She is no longer taking Neurontin.

## 2012-12-30 NOTE — Assessment & Plan Note (Signed)
She has been in remission since 2012

## 2012-12-30 NOTE — Assessment & Plan Note (Signed)
Annual comprehensive exam was done including breast exam was done.  All screenings have been addressed .  

## 2012-12-30 NOTE — Assessment & Plan Note (Signed)
LDL was 45 and liver function tests normal in April. Continue lipitor.

## 2012-12-30 NOTE — Assessment & Plan Note (Signed)
Her pulmonary nodules or followed with serial CTs ordered by Dr. Sherrlyn Hock her oncologist.

## 2012-12-30 NOTE — Assessment & Plan Note (Signed)
She had an injection by Dr. Theo Dills several months ago which improved her pain. She currently has no pain in the she spends greater than 6 hours on her feet. She is wearing orthopedic flip-flops and a plantar fasciitis but at night.

## 2012-12-30 NOTE — Assessment & Plan Note (Signed)
I have addressed  BMI, congratulated her on her 9 pound weight loss and reviewed and recommended a low glycemic index diet utilizing smaller more frequent meals to increase metabolism.  Patient is exercising  a minimum of 5 days per week.

## 2013-01-30 ENCOUNTER — Encounter: Payer: Self-pay | Admitting: Internal Medicine

## 2013-02-03 ENCOUNTER — Other Ambulatory Visit: Payer: Self-pay | Admitting: Cardiovascular Disease

## 2013-02-22 ENCOUNTER — Telehealth: Payer: Self-pay

## 2013-02-22 DIAGNOSIS — R079 Chest pain, unspecified: Secondary | ICD-10-CM

## 2013-02-22 MED ORDER — NITROGLYCERIN 0.4 MG SL SUBL: 0.4000 mg | SUBLINGUAL_TABLET | SUBLINGUAL | Status: DC | PRN

## 2013-02-22 NOTE — Telephone Encounter (Signed)
cb pt and advised per Dr. Mariah Milling may need to see Dr. Kirke Corin sooner to evaluate this discomfort since pt does have h/o CAD. Per Dr. Mariah Milling call in NTG, pt advised as to how/when to use NTG. Dr. Kirke Corin appt 9/23 4 pm. Advised pt go to ED if pain worsening or with exertion. Pt verbalized Plan of Care to all instructions.

## 2013-02-22 NOTE — Telephone Encounter (Signed)
pt called stating she had some chest discomfort 02/17/13 in the evening then had dinner and pain went away, discomfort came back today, pt states she took Advil and pain went away in 15-20 min. I stated sounds like it muscle-skeletel and not heart. Will d/w Dr. Mariah Milling about her c/o today and cb later today with recommendations.

## 2013-02-23 ENCOUNTER — Encounter: Payer: Self-pay | Admitting: Cardiovascular Disease

## 2013-02-24 ENCOUNTER — Other Ambulatory Visit: Payer: Self-pay

## 2013-02-24 DIAGNOSIS — I25810 Atherosclerosis of coronary artery bypass graft(s) without angina pectoris: Secondary | ICD-10-CM

## 2013-02-24 MED ORDER — CLOPIDOGREL BISULFATE 75 MG PO TABS: 75.0000 mg | ORAL_TABLET | Freq: Every day | ORAL | Status: DC

## 2013-02-27 ENCOUNTER — Other Ambulatory Visit: Payer: Self-pay | Admitting: *Deleted

## 2013-02-27 MED ORDER — METOPROLOL TARTRATE 25 MG PO TABS: 25.0000 mg | ORAL_TABLET | Freq: Two times a day (BID) | ORAL | Status: DC

## 2013-02-27 NOTE — Telephone Encounter (Signed)
Refilled Metoprolo sent to [facility].

## 2013-03-07 ENCOUNTER — Encounter: Payer: Self-pay | Admitting: Cardiovascular Disease

## 2013-03-07 ENCOUNTER — Ambulatory Visit (INDEPENDENT_AMBULATORY_CARE_PROVIDER_SITE_OTHER): Payer: BC Managed Care – PPO | Admitting: Cardiovascular Disease

## 2013-03-07 ENCOUNTER — Ambulatory Visit: Payer: BC Managed Care – PPO | Admitting: General Surgery

## 2013-03-07 VITALS — BP 140/76 | HR 77 | Ht 61.0 in | Wt 197.5 lb

## 2013-03-07 DIAGNOSIS — I25810 Atherosclerosis of coronary artery bypass graft(s) without angina pectoris: Secondary | ICD-10-CM

## 2013-03-07 DIAGNOSIS — R0602 Shortness of breath: Secondary | ICD-10-CM

## 2013-03-07 DIAGNOSIS — R079 Chest pain, unspecified: Secondary | ICD-10-CM

## 2013-03-07 DIAGNOSIS — I2510 Atherosclerotic heart disease of native coronary artery without angina pectoris: Secondary | ICD-10-CM

## 2013-03-07 DIAGNOSIS — E785 Hyperlipidemia, unspecified: Secondary | ICD-10-CM

## 2013-03-07 MED ORDER — METOPROLOL TARTRATE 25 MG PO TABS: 25.0000 mg | ORAL_TABLET | Freq: Two times a day (BID) | ORAL | Status: DC

## 2013-03-07 MED ORDER — ATORVASTATIN CALCIUM 10 MG PO TABS: ORAL_TABLET | ORAL | Status: DC

## 2013-03-07 MED ORDER — CLOPIDOGREL BISULFATE 75 MG PO TABS: 75.0000 mg | ORAL_TABLET | Freq: Every day | ORAL | Status: DC

## 2013-03-07 NOTE — Assessment & Plan Note (Signed)
Lab Results  Component Value Date   CHOL 107 09/23/2012   HDL 43.00 09/23/2012   LDLCALC 45 09/23/2012   TRIG 96.0 09/23/2012   CHOLHDL 2 09/23/2012   continue treatment with atorvastatin.

## 2013-03-07 NOTE — Patient Instructions (Addendum)
Your physician has requested that you have an exercise stress myoview. For further information please visit [email or web address] Please follow instruction sheet, as given.  Do not take Metoprolol the morning of stress test.   Your physician wants you to follow-up in: 6 months.  You will receive a reminder letter in the mail two months in advance. If you don't receive a letter, please call our office to schedule the follow-up appointment.   [facility] MYOVIEW  Your caregiver has ordered a Stress Test with nuclear imaging. The purpose of this test is to evaluate the blood supply to your heart muscle. This procedure is referred to as a "Non-Invasive Stress Test." This is because other than having an IV started in your vein, nothing is inserted or "invades" your body. Cardiac stress tests are done to find areas of poor blood flow to the heart by determining the extent of coronary artery disease (CAD). Some patients exercise on a treadmill, which naturally increases the blood flow to your heart, while others who are  unable to walk on a treadmill due to physical limitations have a pharmacologic/chemical stress agent called Lexiscan . This medicine will mimic walking on a treadmill by temporarily increasing your coronary blood flow.   Please note: these test may take anywhere between 2-4 hours to complete  PLEASE REPORT TO [facility] MEDICAL MALL ENTRANCE  THE VOLUNTEERS AT THE FIRST DESK WILL DIRECT YOU WHERE TO GO  Date of Procedure:________Thursday 9/25/14_____________________________  Arrival Time for Procedure:____7:45 am__________________________  Instructions regarding medication:   ____ : Hold diabetes medication morning of procedure  __x__:  Hold betablocker(s) night before procedure and morning of procedure (metoprolol)  ____:  Hold other medications as  follows:_________________________________________________________________________________________________________________________________________________________________________________________________________________________________________________________________________________________  PLEASE NOTIFY THE OFFICE AT LEAST 24 HOURS IN ADVANCE IF YOU ARE UNABLE TO KEEP YOUR APPOINTMENT.  [phone] AND  PLEASE NOTIFY NUCLEAR MEDICINE AT [facility] AT LEAST 24 HOURS IN ADVANCE IF YOU ARE UNABLE TO KEEP YOUR APPOINTMENT. [phone]  How to prepare for your Myoview test:  1. Do not eat or drink after midnight 2. No caffeine for 24 hours prior to test 3. No smoking 24 hours prior to test. 4. Your medication may be taken with water.  If your doctor stopped a medication because of this test, do not take that medication. 5. Ladies, please do not wear dresses.  Skirts or pants are appropriate. Please wear a short sleeve shirt. 6. No perfume, cologne or lotion. 7. Wear comfortable walking shoes. No heels!

## 2013-03-07 NOTE — Progress Notes (Signed)
HPI  This is a 62 year old female who is here today for a followup visit regarding coronary artery disease.  She has history of Hodgkin's lymphoma treated with radiation and chemotherapy to the chest area and has been in remission since 2011. She does have pulmonary nodules which are being followed regularly every 6 months. She is a former smoker. Stress echocardiogram in 02/2012 showed evidence of severe anterior wall ischemia. She underwent cardiac catheterization  which showed a 95% stenosis in the proximal LAD as well as 90% ostial first diagonal stenosis. This was a bifurcation stenosis. She underwent a complex but successful PCI with balloon angioplasty of first diagonal  as well as drug-eluting stent placement to the proximal LAD. She called [place] about 10 days ago with 3 episodes of substernal chest tightness. Each lasted for about 15 minutes with no radiation. The episode happened at rest. She has not had exertional symptoms. A chest pain resolved after she took Advil. She had no chest discomfort before her PCI.  Allergies  Allergen Reactions  . Antifungal [Miconazole Nitrate]   . Sulfa Antibiotics   . Z-Pak [Azithromycin]      Current Outpatient Prescriptions on File Prior to Visit  Medication Sig Dispense Refill  . aspirin 81 MG tablet Take 81 mg by mouth daily.      Marland Kitchen atorvastatin (LIPITOR) 10 MG tablet TAKE ONE TABLET BY MOUTH EVERY DAY  30 tablet  3  . BIOTIN PO Take by mouth daily.      Marland Kitchen CALCIUM-MAGNESIUM-ZINC PO Calcium 1000mg / magnesium 400mg / zinc 25mg  pt takes 1 tablet daily.      . Cholecalciferol (VITAMIN D-3) 1000 UNITS CAPS Take 1 capsule by mouth daily.      . clopidogrel (PLAVIX) 75 MG tablet Take 1 tablet (75 mg total) by mouth daily.  30 tablet  11  . metoprolol tartrate (LOPRESSOR) 25 MG tablet Take 1 tablet (25 mg total) by mouth 2 (two) times daily.  60 tablet  3  . nitroGLYCERIN (NITROSTAT) 0.4 MG SL tablet Place 1 tablet (0.4 mg total) under the tongue every 5  (five) minutes as needed for chest pain.  25 tablet  3  . pyridOXINE (VITAMIN B-6) 100 MG tablet Take 100 mg by mouth daily.       No current facility-administered medications on file prior to visit.     Past Medical History  Diagnosis Date  . Essential hypertension, benign   . Chronic airway obstruction, not elsewhere classified   . Cervicalgia   . Osteoarthritis   . Fibrocystic breast disease   . Polycystic ovarian disease   . History of anemia   . Gestational hypertension   . Hodgkin's lymphoma     s/p radiation and chemo therapy  . Coronary artery disease 02/2012    Abnormal stress test with anterior wall ischemia. Cardiac catheterization showed a 95% stenosis in the proximal LAD bifurcating with a 90% stenosis in first diagonal. Ejection fraction was 45% with anterior wall hypokinesis. She underwent balloon angioplasty to ostial first diagonal and drug-eluting stent placement to proximal LAD with a 3.0 x 15 mm Xience EX drug-eluting stent  . Other and unspecified hyperlipidemia   . Heart murmur   . Heart disease   . History of blood transfusion      Past Surgical History  Procedure Laterality Date  . Bladder sling    . Cardiac catheterization  02/2012    [facility]  . Coronary angioplasty  02/2012    left/right  s/p balloon  . Cholecystectomy  1982  . Transobturator sling N/A 2009    [place]  . Heart stent'  2013     Family History  Problem Relation Age of Onset  . ALS Father   . Polymyositis Father   . Diabetes Brother   . Cancer Maternal Aunt     breast  . Stroke Maternal Grandmother   . Cancer Maternal Grandfather     prostate  . Stroke Maternal Grandfather      History   Social History  . Marital Status: Married    Spouse Name: N/A    Number of Children: N/A  . Years of Education: N/A   Occupational History  . Not on file.   Social History Main Topics  . Smoking status: Former Smoker -- 1.00 packs/day for 30 years    Types: Cigarettes  .  Smokeless tobacco: Not on file  . Alcohol Use: Yes     Comment: occasional  . Drug Use: No  . Sexual Activity: Not on file   Other Topics Concern  . Not on file   Social History Narrative  . No narrative on file     PHYSICAL EXAM   BP 140/76  Pulse 77  Ht 5\' 1"  (1.549 m)  Wt 197 lb 8 oz (89.585 kg)  BMI 37.34 kg/m2  Constitutional: She is oriented to person, place, and time. She appears well-developed and well-nourished. No distress.  HENT: No nasal discharge.  Head: Normocephalic and atraumatic.  Eyes: Pupils are equal and round. Right eye exhibits no discharge. Left eye exhibits no discharge.  Neck: Normal range of motion. Neck supple. No JVD present. No thyromegaly present.  Cardiovascular: Normal rate, regular rhythm, normal heart sounds. Exam reveals no gallop and no friction rub. No murmur heard.  Pulmonary/Chest: Effort normal and breath sounds normal. No stridor. No respiratory distress. She has no wheezes. She has no rales. She exhibits no tenderness.  Abdominal: Soft. Bowel sounds are normal. She exhibits no distension. There is no tenderness. There is no rebound and no guarding.  Musculoskeletal: Normal range of motion. She exhibits no edema and no tenderness.  Neurological: She is alert and oriented to person, place, and time. Coordination normal.  Skin: Skin is warm and dry. No rash noted. She is not diaphoretic. No erythema. No pallor.  Psychiatric: She has a normal mood and affect. Her behavior is normal. Judgment and thought content normal.   [id number]  Rhythm  WITHIN NORMAL LIMITS  ASSESSMENT AND PLAN

## 2013-03-07 NOTE — Assessment & Plan Note (Signed)
She had 3 recent episodes of substernal chest tightness which is concerning given her known history of coronary artery disease and previous bifurcation stenting of the LAD/diagonal. Baseline EKG is not showing any ischemic changes. I recommend evaluation with a treadmill nuclear stress test. Continue current medications.

## 2013-03-09 ENCOUNTER — Ambulatory Visit: Payer: Self-pay | Admitting: Cardiovascular Disease

## 2013-03-09 DIAGNOSIS — R079 Chest pain, unspecified: Secondary | ICD-10-CM

## 2013-03-10 ENCOUNTER — Other Ambulatory Visit: Payer: Self-pay

## 2013-03-10 DIAGNOSIS — R079 Chest pain, unspecified: Secondary | ICD-10-CM

## 2013-03-14 ENCOUNTER — Encounter: Payer: Self-pay | Admitting: Cardiovascular Disease

## 2013-03-16 ENCOUNTER — Telehealth: Payer: Self-pay

## 2013-03-16 NOTE — Telephone Encounter (Signed)
Message copied by Marilynne Halsted on Thu Mar 16, 2013 11:27 AM ------      Message from: Lorine Bears A      Created: Wed Mar 15, 2013  5:51 PM       Slightly abnormal stress test. I called the patient and informed her. Schedule her for a follow up with me in 1 month. ------

## 2013-03-16 NOTE — Telephone Encounter (Signed)
Lmom for pt to call back to schedule an appt w/ Dr. Kirke Corin in 1 month.

## 2013-03-26 ENCOUNTER — Encounter: Payer: Self-pay | Admitting: Internal Medicine

## 2013-03-28 ENCOUNTER — Encounter: Payer: Self-pay | Admitting: Internal Medicine

## 2013-03-30 ENCOUNTER — Encounter: Payer: Self-pay | Admitting: *Deleted

## 2013-04-20 ENCOUNTER — Encounter: Payer: Self-pay | Admitting: Cardiovascular Disease

## 2013-04-20 ENCOUNTER — Other Ambulatory Visit: Payer: Self-pay

## 2013-04-20 ENCOUNTER — Ambulatory Visit (INDEPENDENT_AMBULATORY_CARE_PROVIDER_SITE_OTHER): Payer: BC Managed Care – PPO | Admitting: Cardiovascular Disease

## 2013-04-20 VITALS — BP 131/77 | HR 82 | Ht 61.0 in | Wt 193.8 lb

## 2013-04-20 DIAGNOSIS — E785 Hyperlipidemia, unspecified: Secondary | ICD-10-CM

## 2013-04-20 DIAGNOSIS — I2510 Atherosclerotic heart disease of native coronary artery without angina pectoris: Secondary | ICD-10-CM

## 2013-04-20 NOTE — Assessment & Plan Note (Addendum)
Borderline abnormal stress test. However, she reports no further chest pain or dyspnea. I recommend continuing medical therapy. A lot of her symptoms seems to be triggered by stress especially with difficulty in finances.

## 2013-04-20 NOTE — Assessment & Plan Note (Signed)
Lab Results  Component Value Date   CHOL 107 09/23/2012   HDL 43.00 09/23/2012   LDLCALC 45 09/23/2012   TRIG 96.0 09/23/2012   CHOLHDL 2 09/23/2012   Continue treatment with atorvastatin.

## 2013-04-20 NOTE — Patient Instructions (Signed)
Continue same medications.   Your physician wants you to follow-up in: 6 months.  You will receive a reminder letter in the mail two months in advance. If you don't receive a letter, please call our office to schedule the follow-up appointment.  

## 2013-04-20 NOTE — Progress Notes (Signed)
HPI  This is a 62 year old female who is here today for a followup visit regarding coronary artery disease.  She has history of Hodgkin's lymphoma treated with radiation and chemotherapy to the chest area and has been in remission since 2011. She does have pulmonary nodules which are being followed regularly every 6 months. She is a former smoker. Stress echocardiogram in 02/2012 showed evidence of severe anterior wall ischemia. She underwent cardiac catheterization  which showed a 95% stenosis in the proximal LAD as well as 90% ostial first diagonal stenosis. This was a bifurcation stenosis. She underwent a complex but successful PCI with balloon angioplasty of first diagonal  as well as drug-eluting stent placement to the proximal LAD. She was seen recently for chest pain. She underwent a treadmill nuclear stress test which showed a fixed anterior wall defect with minor reversibility. Ejection fraction was normal. She reports no further episodes of chest pain or dyspnea.  Allergies  Allergen Reactions  . Antifungal [Miconazole Nitrate]   . Sulfa Antibiotics   . Z-Pak [Azithromycin]      Current Outpatient Prescriptions on File Prior to Visit  Medication Sig Dispense Refill  . aspirin 81 MG tablet Take 81 mg by mouth daily.      Marland Kitchen atorvastatin (LIPITOR) 10 MG tablet Take one tablet by mouth daily  90 tablet  3  . BIOTIN PO Take by mouth daily.      Marland Kitchen CALCIUM-MAGNESIUM-ZINC PO Calcium 1000mg / magnesium 400mg / zinc 25mg  pt takes 1 tablet daily.      . Cholecalciferol (VITAMIN D-3) 1000 UNITS CAPS Take 1 capsule by mouth daily.      . clopidogrel (PLAVIX) 75 MG tablet Take 1 tablet (75 mg total) by mouth daily.  90 tablet  3  . metoprolol tartrate (LOPRESSOR) 25 MG tablet Take 1 tablet (25 mg total) by mouth 2 (two) times daily.  180 tablet  3  . nitroGLYCERIN (NITROSTAT) 0.4 MG SL tablet Place 1 tablet (0.4 mg total) under the tongue every 5 (five) minutes as needed for chest pain.  25  tablet  3  . pyridOXINE (VITAMIN B-6) 100 MG tablet Take 100 mg by mouth daily.       No current facility-administered medications on file prior to visit.     Past Medical History  Diagnosis Date  . Essential hypertension, benign   . Chronic airway obstruction, not elsewhere classified   . Cervicalgia   . Osteoarthritis   . Fibrocystic breast disease   . Polycystic ovarian disease   . History of anemia   . Gestational hypertension   . Hodgkin's lymphoma     s/p radiation and chemo therapy  . Coronary artery disease 02/2012    Abnormal stress test with anterior wall ischemia. Cardiac catheterization showed a 95% stenosis in the proximal LAD bifurcating with a 90% stenosis in first diagonal. Ejection fraction was 45% with anterior wall hypokinesis. She underwent balloon angioplasty to ostial first diagonal and drug-eluting stent placement to proximal LAD with a 3.0 x 15 mm Xience EX drug-eluting stent  . Other and unspecified hyperlipidemia   . Heart murmur   . Heart disease   . History of blood transfusion      Past Surgical History  Procedure Laterality Date  . Bladder sling    . Cardiac catheterization  02/2012    [facility]  . Coronary angioplasty  02/2012    left/right s/p balloon  . Cholecystectomy  1982  . Transobturator sling N/A 2009  [place]  . Heart stent'  2013     Family History  Problem Relation Age of Onset  . ALS Father   . Polymyositis Father   . Diabetes Brother   . Cancer Maternal Aunt     breast  . Stroke Maternal Grandmother   . Cancer Maternal Grandfather     prostate  . Stroke Maternal Grandfather      History   Social History  . Marital Status: Married    Spouse Name: N/A    Number of Children: N/A  . Years of Education: N/A   Occupational History  . Not on file.   Social History Main Topics  . Smoking status: Former Smoker -- 1.00 packs/day for 30 years    Types: Cigarettes  . Smokeless tobacco: Not on file  . Alcohol Use: Yes      Comment: occasional  . Drug Use: No  . Sexual Activity: Not on file   Other Topics Concern  . Not on file   Social History Narrative  . No narrative on file     PHYSICAL EXAM   BP 131/77  Pulse 82  Ht 5\' 1"  (1.549 m)  Wt 193 lb 12 oz (87.884 kg)  BMI 36.63 kg/m2  Constitutional: She is oriented to person, place, and time. She appears well-developed and well-nourished. No distress.  HENT: No nasal discharge.  Head: Normocephalic and atraumatic.  Eyes: Pupils are equal and round. Right eye exhibits no discharge. Left eye exhibits no discharge.  Neck: Normal range of motion. Neck supple. No JVD present. No thyromegaly present.  Cardiovascular: Normal rate, regular rhythm, normal heart sounds. Exam reveals no gallop and no friction rub. No murmur heard.  Pulmonary/Chest: Effort normal and breath sounds normal. No stridor. No respiratory distress. She has no wheezes. She has no rales. She exhibits no tenderness.  Abdominal: Soft. Bowel sounds are normal. She exhibits no distension. There is no tenderness. There is no rebound and no guarding.  Musculoskeletal: Normal range of motion. She exhibits no edema and no tenderness.  Neurological: She is alert and oriented to person, place, and time. Coordination normal.  Skin: Skin is warm and dry. No rash noted. She is not diaphoretic. No erythema. No pallor.  Psychiatric: She has a normal mood and affect. Her behavior is normal. Judgment and thought content normal.    ASSESSMENT AND PLAN

## 2013-06-23 ENCOUNTER — Other Ambulatory Visit: Payer: BC Managed Care – PPO

## 2013-06-30 ENCOUNTER — Ambulatory Visit: Payer: BC Managed Care – PPO | Admitting: Internal Medicine

## 2013-07-20 ENCOUNTER — Ambulatory Visit: Payer: Self-pay | Admitting: Internal Medicine

## 2013-07-21 LAB — CBC CANCER CENTER
Basophil #: 0.1 x10 3/mm (ref 0.0–0.1)
Basophil %: 1.1 %
Eosinophil #: 0.1 x10 3/mm (ref 0.0–0.7)
Eosinophil %: 1.1 %
HCT: 40.2 % (ref 35.0–47.0)
HGB: 13.1 g/dL (ref 12.0–16.0)
Lymphocyte #: 1.6 x10 3/mm (ref 1.0–3.6)
Lymphocyte %: 20 %
MCH: 29 pg (ref 26.0–34.0)
MCHC: 32.5 g/dL (ref 32.0–36.0)
MCV: 89 fL (ref 80–100)
Monocyte #: 0.5 x10 3/mm (ref 0.2–0.9)
Monocyte %: 5.9 %
Neutrophil #: 5.7 x10 3/mm (ref 1.4–6.5)
Neutrophil %: 71.9 %
Platelet: 228 x10 3/mm (ref 150–440)
RBC: 4.51 10*6/uL (ref 3.80–5.20)
RDW: 14.2 % (ref 11.5–14.5)
WBC: 7.9 x10 3/mm (ref 3.6–11.0)

## 2013-07-21 LAB — HEPATIC FUNCTION PANEL A (ARMC)
Albumin: 3.7 g/dL (ref 3.4–5.0)
Alkaline Phosphatase: 76 U/L
Bilirubin, Direct: 0.2 mg/dL (ref 0.00–0.20)
Bilirubin,Total: 1 mg/dL (ref 0.2–1.0)
SGOT(AST): 24 U/L (ref 15–37)
SGPT (ALT): 27 U/L (ref 12–78)
Total Protein: 6.8 g/dL (ref 6.4–8.2)

## 2013-07-21 LAB — BASIC METABOLIC PANEL
Anion Gap: 9 (ref 7–16)
BUN: 20 mg/dL — ABNORMAL HIGH (ref 7–18)
Calcium, Total: 8.5 mg/dL (ref 8.5–10.1)
Chloride: 105 mmol/L (ref 98–107)
Co2: 29 mmol/L (ref 21–32)
Creatinine: 1.31 mg/dL — ABNORMAL HIGH (ref 0.60–1.30)
EGFR (African American): 50 — ABNORMAL LOW
EGFR (Non-African Amer.): 43 — ABNORMAL LOW
Glucose: 81 mg/dL (ref 65–99)
Osmolality: 287 (ref 275–301)
Potassium: 4.3 mmol/L (ref 3.5–5.1)
Sodium: 143 mmol/L (ref 136–145)

## 2013-07-21 LAB — LACTATE DEHYDROGENASE: LDH: 225 U/L (ref 81–246)

## 2013-08-02 ENCOUNTER — Ambulatory Visit: Payer: Self-pay | Admitting: Internal Medicine

## 2013-08-13 ENCOUNTER — Ambulatory Visit: Payer: Self-pay | Admitting: Internal Medicine

## 2013-08-14 ENCOUNTER — Telehealth: Payer: Self-pay

## 2013-08-14 NOTE — Telephone Encounter (Signed)
Pt states she is currently on Plavix, would like to know if she needs to keep taking it. Please call and advise.

## 2013-08-14 NOTE — Telephone Encounter (Signed)
I want her to stay on Plavix for now but she can stop it 7 days before colonoscopy and resume it after.

## 2013-08-14 NOTE — Telephone Encounter (Signed)
Patient thought that Dr. [name] said she only needed to be on Plavix until the first of this year. She does not have an appt until May so she wanted to ask now because she is considering a colonoscopy. She said she has no problem staying on the Plavix until then she just wanted to ask.

## 2013-08-15 NOTE — Telephone Encounter (Signed)
LVM 3/3 

## 2013-08-15 NOTE — Telephone Encounter (Signed)
Informed patient that per Dr. [name] "I want her to stay on Plavix for now but she can stop it 7 days before colonoscopy and resume it after." Patient verbalized understanding.

## 2013-08-30 ENCOUNTER — Encounter: Payer: Self-pay | Admitting: Internal Medicine

## 2013-10-17 ENCOUNTER — Ambulatory Visit (INDEPENDENT_AMBULATORY_CARE_PROVIDER_SITE_OTHER): Payer: BC Managed Care – PPO | Admitting: Cardiovascular Disease

## 2013-10-17 ENCOUNTER — Encounter: Payer: Self-pay | Admitting: Cardiovascular Disease

## 2013-10-17 VITALS — BP 110/74 | HR 73 | Ht 62.0 in | Wt 191.0 lb

## 2013-10-17 DIAGNOSIS — R0602 Shortness of breath: Secondary | ICD-10-CM

## 2013-10-17 DIAGNOSIS — I2510 Atherosclerotic heart disease of native coronary artery without angina pectoris: Secondary | ICD-10-CM

## 2013-10-17 DIAGNOSIS — E785 Hyperlipidemia, unspecified: Secondary | ICD-10-CM

## 2013-10-17 NOTE — Patient Instructions (Addendum)
Continue same medications.   Your physician wants you to follow-up in: 6 months.  You will receive a reminder letter in the mail two months in advance. If you don't receive a letter, please call our office to schedule the follow-up appointment.  

## 2013-10-17 NOTE — Assessment & Plan Note (Signed)
Continue treatment with atorvastatin. LDL was at goal.  Lab Results  Component Value Date   CHOL 107 09/23/2012   HDL 43.00 09/23/2012   LDLCALC 45 09/23/2012   TRIG 96.0 09/23/2012   CHOLHDL 2 09/23/2012

## 2013-10-17 NOTE — Progress Notes (Signed)
HPI  This is a 63 year old female who is here today for a followup visit regarding coronary artery disease.  She has history of Hodgkin's lymphoma treated with radiation and chemotherapy to the chest area and has been in remission since 2011. She does have pulmonary nodules which are being followed regularly every 6 months. She is a former smoker. Stress echocardiogram in 02/2012 showed evidence of severe anterior wall ischemia. She underwent cardiac catheterization  which showed a 95% stenosis in the proximal LAD as well as 90% ostial first diagonal stenosis. This was a bifurcation stenosis. She underwent a complex PCI with balloon angioplasty of first diagonal  as well as a drug-eluting stent placement to the proximal LAD. She had recurrent intermittent chest pain after that. A treadmill nuclear stress test in September of 2014 showed a fixed anterior wall defect with minor reversibility. Ejection fraction was normal. She has been doing better overall. She continues to have intermittent atypical right-sided chest pain which is not exertional. She does have chronic exertional dyspnea. She started going to the gym about 2 weeks ago and overall has been doing reasonably well.  Allergies  Allergen Reactions  . Antifungal [Miconazole Nitrate]   . Sulfa Antibiotics   . Z-Pak [Azithromycin]      Current Outpatient Prescriptions on File Prior to Visit  Medication Sig Dispense Refill  . aspirin 81 MG tablet Take 81 mg by mouth daily.      Marland Kitchen atorvastatin (LIPITOR) 10 MG tablet Take one tablet by mouth daily  90 tablet  3  . BIOTIN PO Take by mouth daily.      Marland Kitchen CALCIUM-MAGNESIUM-ZINC PO Calcium 1000mg / magnesium 400mg / zinc 25mg  pt takes 1 tablet daily.      . Cholecalciferol (VITAMIN D-3) 1000 UNITS CAPS Take 1 capsule by mouth daily.      . clopidogrel (PLAVIX) 75 MG tablet Take 1 tablet (75 mg total) by mouth daily.  90 tablet  3  . metoprolol tartrate (LOPRESSOR) 25 MG tablet Take 1 tablet  (25 mg total) by mouth 2 (two) times daily.  180 tablet  3  . nitroGLYCERIN (NITROSTAT) 0.4 MG SL tablet Place 1 tablet (0.4 mg total) under the tongue every 5 (five) minutes as needed for chest pain.  25 tablet  3  . pyridOXINE (VITAMIN B-6) 100 MG tablet Take 100 mg by mouth daily.       No current facility-administered medications on file prior to visit.     Past Medical History  Diagnosis Date  . Essential hypertension, benign   . Chronic airway obstruction, not elsewhere classified   . Cervicalgia   . Osteoarthritis   . Fibrocystic breast disease   . Polycystic ovarian disease   . History of anemia   . Gestational hypertension   . Hodgkin's lymphoma     s/p radiation and chemo therapy  . Coronary artery disease 02/2012    Abnormal stress test with anterior wall ischemia. Cardiac catheterization showed a 95% stenosis in the proximal LAD bifurcating with a 90% stenosis in first diagonal. Ejection fraction was 45% with anterior wall hypokinesis. She underwent balloon angioplasty to ostial first diagonal and drug-eluting stent placement to proximal LAD with a 3.0 x 15 mm Xience EX drug-eluting stent  . Other and unspecified hyperlipidemia   . Heart murmur   . Heart disease   . History of blood transfusion      Past Surgical History  Procedure Laterality Date  . Bladder sling    .  Cardiac catheterization  02/2012    [facility]  . Coronary angioplasty  02/2012    left/right s/p balloon  . Cholecystectomy  1982  . Transobturator sling N/A 2009    [place]  . Heart stent'  2013     Family History  Problem Relation Age of Onset  . ALS Father   . Polymyositis Father   . Diabetes Brother   . Cancer Maternal Aunt     breast  . Stroke Maternal Grandmother   . Cancer Maternal Grandfather     prostate  . Stroke Maternal Grandfather      History   Social History  . Marital Status: Married    Spouse Name: N/A    Number of Children: N/A  . Years of Education: N/A    Occupational History  . Not on file.   Social History Main Topics  . Smoking status: Former Smoker -- 1.00 packs/day for 30 years    Types: Cigarettes  . Smokeless tobacco: Not on file  . Alcohol Use: Yes     Comment: occasional  . Drug Use: No  . Sexual Activity: Not on file   Other Topics Concern  . Not on file   Social History Narrative  . No narrative on file     PHYSICAL EXAM   BP 110/74  Pulse 73  Ht 5\' 2"  (1.575 m)  Wt 191 lb (86.637 kg)  BMI 34.93 kg/m2  Constitutional: She is oriented to person, place, and time. She appears well-developed and well-nourished. No distress.  HENT: No nasal discharge.  Head: Normocephalic and atraumatic.  Eyes: Pupils are equal and round. Right eye exhibits no discharge. Left eye exhibits no discharge.  Neck: Normal range of motion. Neck supple. No JVD present. No thyromegaly present.  Cardiovascular: Normal rate, regular rhythm, normal heart sounds. Exam reveals no gallop and no friction rub. No murmur heard.  Pulmonary/Chest: Effort normal and breath sounds normal. No stridor. No respiratory distress. She has no wheezes. She has no rales. She exhibits no tenderness.  Abdominal: Soft. Bowel sounds are normal. She exhibits no distension. There is no tenderness. There is no rebound and no guarding.  Musculoskeletal: Normal range of motion. She exhibits no edema and no tenderness.  Neurological: She is alert and oriented to person, place, and time. Coordination normal.  Skin: Skin is warm and dry. No rash noted. She is not diaphoretic. No erythema. No pallor.  Psychiatric: She has a normal mood and affect. Her behavior is normal. Judgment and thought content normal.   EKG: Normal sinus rhythm ASSESSMENT AND PLAN

## 2013-10-17 NOTE — Assessment & Plan Note (Signed)
She is overall stable with atypical right-sided chest pain. Chronic exertional dyspnea seems to be improving after she started going to the gym. That is likely a component of physical deconditioning. I am planning to continue dual antiplatelet therapy for 2 years after stenting. Plavix can be discontinued after September of 2015 if she remains stable.

## 2014-03-06 ENCOUNTER — Encounter: Payer: Self-pay | Admitting: Cardiovascular Disease

## 2014-03-08 NOTE — Telephone Encounter (Signed)
LVM to inform patient she no longer needs plavix per email from Dr. [name].

## 2014-03-26 ENCOUNTER — Other Ambulatory Visit: Payer: Self-pay | Admitting: Cardiovascular Disease

## 2014-03-27 ENCOUNTER — Encounter: Payer: Self-pay | Admitting: Cardiovascular Disease

## 2014-03-27 ENCOUNTER — Ambulatory Visit: Payer: Self-pay | Admitting: Internal Medicine

## 2014-03-27 LAB — BASIC METABOLIC PANEL
Anion Gap: 2 — ABNORMAL LOW (ref 7–16)
BUN: 14 mg/dL (ref 7–18)
Calcium, Total: 8.7 mg/dL (ref 8.5–10.1)
Chloride: 107 mmol/L (ref 98–107)
Co2: 33 mmol/L — ABNORMAL HIGH (ref 21–32)
Creatinine: 1.04 mg/dL (ref 0.60–1.30)
EGFR (African American): >60
EGFR (Non-African Amer.): 57 — ABNORMAL LOW
Glucose: 77 mg/dL (ref 65–99)
Osmolality: 282 (ref 275–301)
Potassium: 4.6 mmol/L (ref 3.5–5.1)
Sodium: 142 mmol/L (ref 136–145)

## 2014-03-27 LAB — CBC CANCER CENTER
Basophil #: 0.1 x10 3/mm (ref 0.0–0.1)
Basophil %: 1.1 %
Eosinophil #: 0.1 x10 3/mm (ref 0.0–0.7)
Eosinophil %: 1.1 %
HCT: 41.8 % (ref 35.0–47.0)
HGB: 13.4 g/dL (ref 12.0–16.0)
Lymphocyte #: 1.4 x10 3/mm (ref 1.0–3.6)
Lymphocyte %: 20.2 %
MCH: 29.2 pg (ref 26.0–34.0)
MCHC: 32 g/dL (ref 32.0–36.0)
MCV: 92 fL (ref 80–100)
Monocyte #: 0.6 x10 3/mm (ref 0.2–0.9)
Monocyte %: 8.1 %
Neutrophil #: 4.9 x10 3/mm (ref 1.4–6.5)
Neutrophil %: 69.5 %
Platelet: 220 x10 3/mm (ref 150–440)
RBC: 4.57 10*6/uL (ref 3.80–5.20)
RDW: 14.2 % (ref 11.5–14.5)
WBC: 7 x10 3/mm (ref 3.6–11.0)

## 2014-03-27 LAB — HEPATIC FUNCTION PANEL A (ARMC)
Albumin: 3.6 g/dL (ref 3.4–5.0)
Alkaline Phosphatase: 81 U/L
Bilirubin, Direct: 0.2 mg/dL (ref 0.00–0.20)
Bilirubin,Total: 1.1 mg/dL — ABNORMAL HIGH (ref 0.2–1.0)
SGOT(AST): 17 U/L (ref 15–37)
SGPT (ALT): 22 U/L
Total Protein: 6.9 g/dL (ref 6.4–8.2)

## 2014-03-27 LAB — LACTATE DEHYDROGENASE: LDH: 178 U/L (ref 81–246)

## 2014-03-27 MED ORDER — ATORVASTATIN CALCIUM 10 MG PO TABS: ORAL_TABLET | ORAL | Status: DC

## 2014-04-11 ENCOUNTER — Other Ambulatory Visit: Payer: Self-pay | Admitting: Cardiovascular Disease

## 2014-04-11 ENCOUNTER — Telehealth: Payer: Self-pay | Admitting: Cardiovascular Disease

## 2014-04-11 MED ORDER — METOPROLOL TARTRATE 25 MG PO TABS: ORAL_TABLET | ORAL | Status: DC

## 2014-04-11 NOTE — Telephone Encounter (Signed)
Pt needs a 3 month supply of Metoprolol to [facility] [address]

## 2014-04-11 NOTE — Telephone Encounter (Signed)
Refill for metoprolol

## 2014-04-15 ENCOUNTER — Ambulatory Visit: Payer: Self-pay | Admitting: Internal Medicine

## 2014-04-23 ENCOUNTER — Encounter: Payer: Self-pay | Admitting: Cardiovascular Disease

## 2014-04-23 ENCOUNTER — Ambulatory Visit (INDEPENDENT_AMBULATORY_CARE_PROVIDER_SITE_OTHER): Payer: BC Managed Care – PPO | Admitting: Cardiovascular Disease

## 2014-04-23 VITALS — BP 114/80 | HR 72 | Ht 61.5 in | Wt 187.5 lb

## 2014-04-23 DIAGNOSIS — E785 Hyperlipidemia, unspecified: Secondary | ICD-10-CM

## 2014-04-23 DIAGNOSIS — I2510 Atherosclerotic heart disease of native coronary artery without angina pectoris: Secondary | ICD-10-CM

## 2014-04-23 DIAGNOSIS — R079 Chest pain, unspecified: Secondary | ICD-10-CM

## 2014-04-23 NOTE — Progress Notes (Signed)
HPI  This is a 63 year old female who is here today for a followup visit regarding coronary artery disease.  She has history of Hodgkin's lymphoma treated with radiation and chemotherapy to the chest area and has been in remission since 2011. She does have pulmonary nodules which are being followed regularly every 6 months. She is a former smoker. Stress echocardiogram in 02/2012 showed evidence of severe anterior wall ischemia. She underwent cardiac catheterization  which showed a 95% stenosis in the proximal LAD as well as 90% ostial first diagonal stenosis. This was a bifurcation stenosis. She underwent a complex PCI with balloon angioplasty of first diagonal  as well as a drug-eluting stent placement to the proximal LAD. She had recurrent intermittent chest pain after that. A treadmill nuclear stress test in September of 2014 showed a fixed anterior wall defect with minor reversibility. Ejection fraction was normal. She has been doing well overall and denies any chest pain or shortness of breath.  Allergies  Allergen Reactions  . Antifungal [Miconazole Nitrate]   . Sulfa Antibiotics   . Z-Pak [Azithromycin]      Current Outpatient Prescriptions on File Prior to Visit  Medication Sig Dispense Refill  . aspirin 81 MG tablet Take 81 mg by mouth daily.    Marland Kitchen atorvastatin (LIPITOR) 10 MG tablet TAKE ONE TABLET BY MOUTH ONCE DAILY 90 tablet 3  . BIOTIN PO Take by mouth daily.    Marland Kitchen CALCIUM-MAGNESIUM-ZINC PO Calcium 1000mg / magnesium 400mg / zinc 25mg  pt takes 1 tablet daily.    . Cholecalciferol (VITAMIN D-3) 1000 UNITS CAPS Take 1 capsule by mouth daily.    . metoprolol tartrate (LOPRESSOR) 25 MG tablet TAKE ONE TABLET BY MOUTH TWICE DAILY 180 tablet 3  . nitroGLYCERIN (NITROSTAT) 0.4 MG SL tablet Place 1 tablet (0.4 mg total) under the tongue every 5 (five) minutes as needed for chest pain. 25 tablet 3  . pyridOXINE (VITAMIN B-6) 100 MG tablet Take 100 mg by mouth daily.     No current  facility-administered medications on file prior to visit.     Past Medical History  Diagnosis Date  . Essential hypertension, benign   . Chronic airway obstruction, not elsewhere classified   . Cervicalgia   . Osteoarthritis   . Fibrocystic breast disease   . Polycystic ovarian disease   . History of anemia   . Gestational hypertension   . Hodgkin's lymphoma     s/p radiation and chemo therapy  . Coronary artery disease 02/2012    Abnormal stress test with anterior wall ischemia. Cardiac catheterization showed a 95% stenosis in the proximal LAD bifurcating with a 90% stenosis in first diagonal. Ejection fraction was 45% with anterior wall hypokinesis. She underwent balloon angioplasty to ostial first diagonal and drug-eluting stent placement to proximal LAD with a 3.0 x 15 mm Xience EX drug-eluting stent  . Other and unspecified hyperlipidemia   . Heart murmur   . Heart disease   . History of blood transfusion      Past Surgical History  Procedure Laterality Date  . Bladder sling    . Cardiac catheterization  02/2012    [facility]  . Coronary angioplasty  02/2012    left/right s/p balloon  . Cholecystectomy  1982  . Transobturator sling N/A 2009    [place]  . Heart stent'  2013     Family History  Problem Relation Age of Onset  . ALS Father   . Polymyositis Father   .  Diabetes Brother   . Cancer Maternal Aunt     breast  . Stroke Maternal Grandmother   . Cancer Maternal Grandfather     prostate  . Stroke Maternal Grandfather      History   Social History  . Marital Status: Married    Spouse Name: N/A    Number of Children: N/A  . Years of Education: N/A   Occupational History  . Not on file.   Social History Main Topics  . Smoking status: Former Smoker -- 1.00 packs/day for 30 years    Types: Cigarettes  . Smokeless tobacco: Not on file  . Alcohol Use: Yes     Comment: occasional  . Drug Use: No  . Sexual Activity: Not on file   Other Topics Concern   . Not on file   Social History Narrative     PHYSICAL EXAM   BP 114/80 mmHg  Pulse 72  Ht 5' 1.5" (1.562 m)  Wt 187 lb 8 oz (85.049 kg)  BMI 34.86 kg/m2  Constitutional: She is oriented to person, place, and time. She appears well-developed and well-nourished. No distress.  HENT: No nasal discharge.  Head: Normocephalic and atraumatic.  Eyes: Pupils are equal and round. Right eye exhibits no discharge. Left eye exhibits no discharge.  Neck: Normal range of motion. Neck supple. No JVD present. No thyromegaly present.  Cardiovascular: Normal rate, regular rhythm, normal heart sounds. Exam reveals no gallop and no friction rub. No murmur heard.  Pulmonary/Chest: Effort normal and breath sounds normal. No stridor. No respiratory distress. She has no wheezes. She has no rales. She exhibits no tenderness.  Abdominal: Soft. Bowel sounds are normal. She exhibits no distension. There is no tenderness. There is no rebound and no guarding.  Musculoskeletal: Normal range of motion. She exhibits no edema and no tenderness.  Neurological: She is alert and oriented to person, place, and time. Coordination normal.  Skin: Skin is warm and dry. No rash noted. She is not diaphoretic. No erythema. No pallor.  Psychiatric: She has a normal mood and affect. Her behavior is normal. Judgment and thought content normal.   EKG: Normal sinus rhythm  ASSESSMENT AND PLAN

## 2014-04-23 NOTE — Patient Instructions (Signed)
Continue same medications.   Your physician wants you to follow-up in: 6 months.  You will receive a reminder letter in the mail two months in advance. If you don't receive a letter, please call our office to schedule the follow-up appointment.  Your next appointment will be scheduled in our new office located at :  [facility]  [address], [address]  [place], [place] [address]  

## 2014-04-23 NOTE — Assessment & Plan Note (Signed)
She is doing well overall with no symptoms suggestive of angina. Continue medical therapy.

## 2014-04-23 NOTE — Assessment & Plan Note (Signed)
Lab Results  Component Value Date   CHOL 107 09/23/2012   HDL 43.00 09/23/2012   LDLCALC 45 09/23/2012   TRIG 96.0 09/23/2012   CHOLHDL 2 09/23/2012   Continue treatment with atorvastatin.

## 2014-06-22 ENCOUNTER — Encounter: Payer: Self-pay | Admitting: Nurse Practitioner

## 2014-06-22 ENCOUNTER — Ambulatory Visit: Payer: Self-pay | Admitting: Internal Medicine

## 2014-06-22 ENCOUNTER — Ambulatory Visit (INDEPENDENT_AMBULATORY_CARE_PROVIDER_SITE_OTHER): Payer: BLUE CROSS/BLUE SHIELD | Admitting: Nurse Practitioner

## 2014-06-22 VITALS — BP 128/70 | HR 67 | Temp 97.8°F | Resp 12 | Ht 61.5 in | Wt 191.0 lb

## 2014-06-22 DIAGNOSIS — N812 Incomplete uterovaginal prolapse: Secondary | ICD-10-CM

## 2014-06-22 NOTE — Progress Notes (Signed)
Pre visit review using our clinic review tool, if applicable. No additional management support is needed unless otherwise documented below in the visit note. 

## 2014-06-22 NOTE — Progress Notes (Signed)
   Subjective:    Patient ID: Jaime Hall, female    DOB: 08/30/1950, 64 y.o.   MRN: [id number]  HPI  Jaime Hall is a 64 yo female with a CC of "cyst" between the vagina and rectum.   1) Onset 3 months ago around Oct. When wiping after urinating she felt something "large" around her vaginal area. This same thing occurred later, she reports, when defecating and she felt it between the vagina and rectum. She felt it was "cyst like" but denies it is painful, draining, or malodorous. The only times she notices this is when using the restroom. She denies blood in urine/stool or on tissue.   Review of Systems  Constitutional: Negative for fever, chills, diaphoresis, fatigue and unexpected weight change.  Gastrointestinal: Negative for nausea, vomiting, abdominal pain, diarrhea, constipation, blood in stool, abdominal distention, anal bleeding and rectal pain.  Genitourinary: Negative for dysuria, urgency, frequency, hematuria, flank pain, decreased urine volume, vaginal bleeding, vaginal discharge, genital sores, vaginal pain, pelvic pain and dyspareunia.  Skin: Negative for color change, pallor, rash and wound.       Objective:   Physical Exam  Constitutional: She appears well-developed and well-nourished. No distress.  Cardiovascular: Normal rate and regular rhythm.   Pulmonary/Chest: Effort normal and breath sounds normal.  Abdominal: Soft. Bowel sounds are normal. She exhibits no distension and no mass. There is no tenderness. There is no rebound and no guarding.  Genitourinary: No labial fusion. There is no rash, tenderness, lesion or injury on the right labia. There is no rash, tenderness, lesion or injury on the left labia. No erythema, tenderness or bleeding in the vagina. No foreign body around the vagina. No signs of injury around the vagina. No vaginal discharge found.  Pelvic exam revealed normal postmenopausal external and internal genitalia. She was found to have a prolapsed  uterus that may protrude when using the restroom.   Skin: Skin is warm and dry. No rash noted. She is not diaphoretic.  Psychiatric: She has a normal mood and affect. Her behavior is normal. Judgment and thought content normal.   BP 128/70 mmHg  Pulse 67  Temp(Src) 97.8 F (36.6 C) (Oral)  Resp 12  Ht 5' 1.5" (1.562 m)  Wt 191 lb (86.637 kg)  BMI 35.51 kg/m2  SpO2 97%      Assessment & Plan:

## 2014-06-22 NOTE — Patient Instructions (Signed)
Please call us if you have any worsening symptoms or concerns.

## 2014-06-24 DIAGNOSIS — N812 Incomplete uterovaginal prolapse: Secondary | ICD-10-CM | POA: Insufficient documentation

## 2014-06-24 NOTE — Assessment & Plan Note (Signed)
Stable. If bothersome to patient in the future I can refer her to GYN for evaluation and treatment. At this time no abnormalities or cysts found on exam today. Pt is okay with this plan at this time. FU prn worsening/failure to improve.

## 2014-09-24 ENCOUNTER — Ambulatory Visit: Payer: Self-pay | Admitting: Podiatry

## 2014-09-24 ENCOUNTER — Ambulatory Visit (INDEPENDENT_AMBULATORY_CARE_PROVIDER_SITE_OTHER): Payer: PPO

## 2014-09-24 ENCOUNTER — Encounter: Payer: Self-pay | Admitting: Podiatry

## 2014-09-24 ENCOUNTER — Ambulatory Visit (INDEPENDENT_AMBULATORY_CARE_PROVIDER_SITE_OTHER): Payer: PPO | Admitting: Podiatry

## 2014-09-24 VITALS — BP 116/66 | HR 77 | Resp 16 | Ht 61.0 in | Wt 190.0 lb

## 2014-09-24 DIAGNOSIS — M79672 Pain in left foot: Secondary | ICD-10-CM

## 2014-09-24 DIAGNOSIS — M216X9 Other acquired deformities of unspecified foot: Secondary | ICD-10-CM

## 2014-09-24 DIAGNOSIS — M7742 Metatarsalgia, left foot: Secondary | ICD-10-CM

## 2014-09-25 NOTE — Progress Notes (Signed)
Patient ID: Jaime Hall, female   DOB: 10/09/1950, 64 y.o.   MRN: [id number]  Subjective: 64 year old female presents the office today with complaints of a "knot" on the bottom of her left foot underneath the second and third toes. She states that she has pain in this area with prolonged ambulation and weightbearing. She denies a history of injury or trauma. She denies any overlying swelling or any redness. She's had no prior treatment for this. Denies a numbness or tingling. No other complaints at this time.  Objective: AAO x3, NAD DP/PT pulses palpable bilaterally, CRT less than 3 seconds Protective sensation intact with Simms Weinstein monofilament, vibratory sensation intact, Achilles tendon reflex intact On the plantar aspect of the feet bilaterally there is prominent metatarsal heads with atrophy of the fat pad. There is tenderness on the left just distal second and third metatarsal heads with slight underlying hammertoe contractures. There is no areas of pinpoint bony tenderness or pain with vibratory sensation. There is no overlying edema, erythema, increase in warmth. No other areas of tenderness to bilateral lower extremities. MMT 5/5, ROM WNL.  No open lesions or pre-ulcerative lesions.  No overlying edema, erythema, increase in warmth to bilateral lower extremities.  No pain with calf compression, swelling, warmth, erythema bilaterally.   Assessment: 64 year old female with prominent metatarsal heads; metatarsalgia  Plan: -X-rays were obtained and reviewed with the patient -Treatment options were discussed included alternatives, risks, complications -Discussed likely etiology -Dispensed metatarsal offloading pads. -Discussed shoe gear modifications and possible orthotics -Follow-up as needed. Follow-up with the symptoms are not resolving a couple weeks or sooner if any problems are to arise. In the meantime occur to call the office with any questions or concerns.

## 2014-10-01 ENCOUNTER — Other Ambulatory Visit (HOSPITAL_COMMUNITY)
Admission: RE | Admit: 2014-10-01 | Discharge: 2014-10-01 | Disposition: A | Discharge status: Home / Self Care | Payer: BLUE CROSS/BLUE SHIELD | Source: Ambulatory Visit | Attending: Internal Medicine | Admitting: Internal Medicine

## 2014-10-01 ENCOUNTER — Encounter: Payer: Self-pay | Admitting: Internal Medicine

## 2014-10-01 ENCOUNTER — Ambulatory Visit (INDEPENDENT_AMBULATORY_CARE_PROVIDER_SITE_OTHER): Payer: BLUE CROSS/BLUE SHIELD | Admitting: Internal Medicine

## 2014-10-01 VITALS — BP 128/78 | HR 85 | Temp 97.6°F | Resp 16 | Ht 63.0 in | Wt 193.0 lb

## 2014-10-01 DIAGNOSIS — Z8571 Personal history of Hodgkin lymphoma: Secondary | ICD-10-CM

## 2014-10-01 DIAGNOSIS — Z0000 Encounter for general adult medical examination without abnormal findings: Secondary | ICD-10-CM

## 2014-10-01 DIAGNOSIS — Z01419 Encounter for gynecological examination (general) (routine) without abnormal findings: Secondary | ICD-10-CM | POA: Insufficient documentation

## 2014-10-01 DIAGNOSIS — M25552 Pain in left hip: Secondary | ICD-10-CM

## 2014-10-01 DIAGNOSIS — R1314 Dysphagia, pharyngoesophageal phase: Secondary | ICD-10-CM | POA: Insufficient documentation

## 2014-10-01 DIAGNOSIS — E785 Hyperlipidemia, unspecified: Secondary | ICD-10-CM | POA: Diagnosis not present

## 2014-10-01 DIAGNOSIS — E669 Obesity, unspecified: Secondary | ICD-10-CM | POA: Diagnosis not present

## 2014-10-01 DIAGNOSIS — R0789 Other chest pain: Secondary | ICD-10-CM | POA: Diagnosis not present

## 2014-10-01 DIAGNOSIS — N841 Polyp of cervix uteri: Secondary | ICD-10-CM | POA: Insufficient documentation

## 2014-10-01 DIAGNOSIS — Z1151 Encounter for screening for human papillomavirus (HPV): Secondary | ICD-10-CM | POA: Insufficient documentation

## 2014-10-01 DIAGNOSIS — Z1159 Encounter for screening for other viral diseases: Secondary | ICD-10-CM

## 2014-10-01 DIAGNOSIS — E559 Vitamin D deficiency, unspecified: Secondary | ICD-10-CM

## 2014-10-01 DIAGNOSIS — M25551 Pain in right hip: Secondary | ICD-10-CM

## 2014-10-01 DIAGNOSIS — Z1239 Encounter for other screening for malignant neoplasm of breast: Secondary | ICD-10-CM

## 2014-10-01 DIAGNOSIS — R1310 Dysphagia, unspecified: Secondary | ICD-10-CM

## 2014-10-01 DIAGNOSIS — R079 Chest pain, unspecified: Secondary | ICD-10-CM

## 2014-10-01 NOTE — Progress Notes (Signed)
Patient ID: Jaime Hall, female   DOB: 1950-11-17, 64 y.o.   MRN: [id number]  The patient is here for annual Medicare wellness examination and management of other chronic and acute problems.  She  was last seen in March 2014.    She reports episodes of recurrent chest pain feels like a stabbing pain in the epigastric area/.xiphoid process,  Occurs when she is active working in [employer] , or when walking across a room.  Sometimes associates it with difficulty swallowing but not always.  She has a history of CAD, but also has a history of XRT to chest wall after undergoing chemotherapy in 2011 for HL.   Has also been been treated and evaluated for GERD and esophagitis with a barium swallow,  Last Cardiac stress test was negative for reversible ischemia in 2014. She has cardiology follow up with Dr. [name] next week   She also reports a feeling that pills are occasionally getting stuck in her throat,  Has  regurgitated at time.  No prior EGD   She has been having severe right pain aggravated by changes in position.  No history of falls,  No prior evaluation  last PAP unkown possibly 2015 but she is not sure  Last colonoscopy 2010  [facility],  Dr Allen Norris    The risk factors are reflected in the social history.  The roster of all physicians providing medical care to patient - is listed in the Snapshot section of the chart.  Activities of daily living:  The patient is 100% independent in all ADLs: dressing, toileting, feeding as well as independent mobility  Home safety : The patient has smoke detectors in the home. They wear seatbelts.  There are no firearms at home. There is no violence in the home.   There is no risks for hepatitis, STDs or HIV. There is no   history of blood transfusion. They have no travel history to infectious disease endemic areas of the world.  The patient has seen their dentist in the last six month. They have seen their eye doctor in the last year. They admit to  slight hearing difficulty with regard to whispered voices and some television programs.  They have deferred audiologic testing in the last year.  They do not  have excessive sun exposure. Discussed the need for sun protection: hats, long sleeves and use of sunscreen if there is significant sun exposure.   Diet: the importance of a healthy diet is discussed. They do not have a healthy diet.  The benefits of regular aerobic exercise were discussed. She does not exercise due to multiple orthopedic complaints.   Depression screen: there are no signs or vegative symptoms of depression- irritability, change in appetite, anhedonia, sadness/tearfullness.  Cognitive assessment: the patient manages all their financial and personal affairs and is actively engaged. They could relate day,date,year and events; recalled 2/3 objects at 3 minutes; performed clock-face test normally.  The following portions of the patient's history were reviewed and updated as appropriate: allergies, current medications, past family history, past medical history,  past surgical history, past social history  and problem list.  Review of Systems:  Patient denies headache, fevers, malaise, unintentional weight loss, skin rash, eye pain, sinus congestion and sinus pain, sore throat, dysphagia,  hemoptysis , cough, dyspnea, wheezing, , palpitations, orthopnea, edema, abdominal pain, nausea, melena, diarrhea, constipation, flank pain, dysuria, hematuria, urinary  Frequency, nocturia, numbness, tingling, seizures,  Focal weakness, Loss of consciousness,  Tremor, insomnia, depression, anxiety,  and suicidal ideation.     Visual acuity was not assessed per patient preference since she has regular follow up with her ophthalmologist. Hearing and body mass index were assessed and reviewed.   During the course of the visit the patient was educated and counseled about appropriate screening and preventive services including : fall prevention ,  diabetes screening, nutrition counseling, colorectal cancer screening, and recommended immunizations.    Objective:  BP 128/78 mmHg  Pulse 85  Temp(Src) 97.6 F (36.4 C) (Oral)  Resp 16  Ht 5\' 3"  (1.6 m)  Wt 193 lb (87.544 kg)  BMI 34.20 kg/m2  SpO2 98%  General Appearance:    Alert, cooperative, no distress, appears stated age  Head:    Normocephalic, without obvious abnormality, atraumatic  Eyes:    PERRL, conjunctiva/corneas clear, EOM's intact, fundi    benign, both eyes  Ears:    Normal TM's and external ear canals, both ears  Nose:   Nares normal, septum midline, mucosa normal, no drainage    or sinus tenderness  Throat:   Lips, mucosa, and tongue normal; teeth and gums normal  Neck:   Supple, symmetrical, trachea midline, no adenopathy;    thyroid:  no enlargement/tenderness/nodules; no carotid   bruit or JVD  Back:     Symmetric, no curvature, ROM normal, no CVA tenderness  Lungs:     Clear to auscultation bilaterally, respirations unlabored  Chest Wall:    No tenderness or deformity   Heart:    Regular rate and rhythm, S1 and S2 normal, no murmur, rub   or gallop  Breast Exam:    Pendulous breast without  tenderness, masses, or nipple abnormality  Abdomen:     Soft, non-tender, bowel sounds active all four quadrants,    no masses, no organomegaly  Genitalia:    Pelvic: cervical polyp,  Friable  external genitalia normal, no adnexal masses or tenderness, no cervical motion tenderness, rectovaginal septum normal, uterus normal size, shape, and consistency and vagina normal without discharge  Extremities:   Extremities normal, atraumatic, no cyanosis or edema  Pulses:   2+ and symmetric all extremities  Skin:   Skin color, texture, turgor normal, no rashes or lesions  Lymph nodes:   Cervical, supraclavicular, and axillary nodes normal  Neurologic:   CNII-XII intact, normal strength, sensation and reflexes    throughout    Assessment and Plan:  History of hodgkin's  lymphoma Has been in remission sice last treatemetn May 2011.  followedby Pandit annually .  Treated with chemo and XRT    Chest pain on exertion EKG today showed no acute changes.  She has known CAD and last stress test was negative for reversible changes in 2014.  She has been advised to follow up with dr [name] and to use her SL ntg as needed x 3, and go to ER if pain is nor resolved.    Cervical polyp PAP was done' polyp was friable,  Referral to GYN for evaluation was advised and accepted.    Dysphagia, pharyngoesophageal phase DG esophagus ordered to rule out stricture given history of chest irradiation.  If normal will need EGD   Hip pain, bilateral Plain films were normal in 2014,  But right hip films have been reordered given severe pain noted today with minor changes in position. Referral to Ortho planned pending evaluation of joint space.    Hyperlipidemia Managed with  High potency statin ,  Low dose.   Dr. [name] has been  refilling her statin.  Her last liver enzyme was April 2014.  Se is not fasting currently and has been asked to return ASAP for fasting lipids.   Lab Results  Component Value Date   CHOL 107 09/23/2012   HDL 43.00 09/23/2012   LDLCALC 45 09/23/2012   TRIG 96.0 09/23/2012   CHOLHDL 2 09/23/2012   Lab Results  Component Value Date   ALT 22 10/11/2012   AST 23 10/11/2012   ALKPHOS 57 10/11/2012   BILITOT 1.2 10/11/2012      Routine general medical examination at a health care facility Annual Medicare wellness  exam was done as well as a comprehensive physical exam and management of acute and chronic conditions .  During the course of the visit the patient was educated and counseled about appropriate screening and preventive services including : fall prevention , diabetes screening, nutrition counseling, colorectal cancer screening, and recommended immunizations.  Printed recommendations for health maintenance screenings was given.    Obesity (BMI  30-39.9) I have addressed  BMI and recommended wt loss of 10% of body weigh over the next 6 months using a low glycemic index diet and regular exercise a minimum of 5 days per week.  Body mass index is 34.2 kg/(m^2).      Updated Medication List Outpatient Encounter Prescriptions as of 10/01/2014  Medication Sig  . aspirin 81 MG tablet Take 81 mg by mouth daily.  Marland Kitchen atorvastatin (LIPITOR) 10 MG tablet TAKE ONE TABLET BY MOUTH ONCE DAILY  . BIOTIN PO Take by mouth daily.  . Cholecalciferol (VITAMIN D-3) 1000 UNITS CAPS Take 1 capsule by mouth daily.  . metoprolol tartrate (LOPRESSOR) 25 MG tablet TAKE ONE TABLET BY MOUTH TWICE DAILY  . nitroGLYCERIN (NITROSTAT) 0.4 MG SL tablet Place 1 tablet (0.4 mg total) under the tongue every 5 (five) minutes as needed for chest pain.  Marland Kitchen pyridOXINE (VITAMIN B-6) 100 MG tablet Take 100 mg by mouth daily.

## 2014-10-01 NOTE — Progress Notes (Signed)
Pre-visit discussion using our clinic review tool. No additional management support is needed unless otherwise documented below in the visit note.  

## 2014-10-01 NOTE — Patient Instructions (Signed)
I am orderig x rays of your eight hip to see if you need to see an orthopedist   x ray of esophagus to determine if the esophagus is spasming or has a stricture   Please talk to Dr [name] about your exertional chest pain  Return for fasting labs ASAP   Health Maintenance Adopting a healthy lifestyle and getting preventive care can go a long way to promote health and wellness. Talk with your health care provider about what schedule of regular examinations is right for you. This is a good chance for you to check in with your provider about disease prevention and staying healthy. In between checkups, there are plenty of things you can do on your own. Experts have done a lot of research about which lifestyle changes and preventive measures are most likely to keep you healthy. Ask your health care provider for more information. WEIGHT AND DIET  Eat a healthy diet  Be sure to include plenty of vegetables, fruits, low-fat dairy products, and lean protein.  Do not eat a lot of foods high in solid fats, added sugars, or salt.  Get regular exercise. This is one of the most important things you can do for your health.  Most adults should exercise for at least 150 minutes each week. The exercise should increase your heart rate and make you sweat (moderate-intensity exercise).  Most adults should also do strengthening exercises at least twice a week. This is in addition to the moderate-intensity exercise.  Maintain a healthy weight  Body mass index (BMI) is a measurement that can be used to identify possible weight problems. It estimates body fat based on height and weight. Your health care provider can help determine your BMI and help you achieve or maintain a healthy weight.  For females 40 years of age and older:   A BMI below 18.5 is considered underweight.  A BMI of 18.5 to 24.9 is normal.  A BMI of 25 to 29.9 is considered overweight.  A BMI of 30 and above is considered obese.  Watch  levels of cholesterol and blood lipids  You should start having your blood tested for lipids and cholesterol at 65 years of age, then have this test every 5 years.  You may need to have your cholesterol levels checked more often if:  Your lipid or cholesterol levels are high.  You are older than 65 years of age.  You are at high risk for heart disease.  CANCER SCREENING   Lung Cancer  Lung cancer screening is recommended for adults 6-82 years old who are at high risk for lung cancer because of a history of smoking.  A yearly low-dose CT scan of the lungs is recommended for people who:  Currently smoke.  Have quit within the past 15 years.  Have at least a 30-pack-year history of smoking. A pack year is smoking an average of one pack of cigarettes a day for 1 year.  Yearly screening should continue until it has been 15 years since you quit.  Yearly screening should stop if you develop a health problem that would prevent you from having lung cancer treatment.  Breast Cancer  Practice breast self-awareness. This means understanding how your breasts normally appear and feel.  It also means doing regular breast self-exams. Let your health care provider know about any changes, no matter how small.  If you are in your 20s or 30s, you should have a clinical breast exam (CBE) by a health care  provider every 1-3 years as part of a regular health exam.  If you are 63 or older, have a CBE every year. Also consider having a breast X-ray (mammogram) every year.  If you have a family history of breast cancer, talk to your health care provider about genetic screening.  If you are at high risk for breast cancer, talk to your health care provider about having an MRI and a mammogram every year.  Breast cancer gene (BRCA) assessment is recommended for women who have family members with BRCA-related cancers. BRCA-related cancers include:  Breast.  Ovarian.  Tubal.  Peritoneal  cancers.  Results of the assessment will determine the need for genetic counseling and BRCA1 and BRCA2 testing. Cervical Cancer Routine pelvic examinations to screen for cervical cancer are no longer recommended for nonpregnant women who are considered low risk for cancer of the pelvic organs (ovaries, uterus, and vagina) and who do not have symptoms. A pelvic examination may be necessary if you have symptoms including those associated with pelvic infections. Ask your health care provider if a screening pelvic exam is right for you.   The Pap test is the screening test for cervical cancer for women who are considered at risk.  If you had a hysterectomy for a problem that was not cancer or a condition that could lead to cancer, then you no longer need Pap tests.  If you are older than 65 years, and you have had normal Pap tests for the past 10 years, you no longer need to have Pap tests.  If you have had past treatment for cervical cancer or a condition that could lead to cancer, you need Pap tests and screening for cancer for at least 20 years after your treatment.  If you no longer get a Pap test, assess your risk factors if they change (such as having a new sexual partner). This can affect whether you should start being screened again.  Some women have medical problems that increase their chance of getting cervical cancer. If this is the case for you, your health care provider may recommend more frequent screening and Pap tests.  The human papillomavirus (HPV) test is another test that may be used for cervical cancer screening. The HPV test looks for the virus that can cause cell changes in the cervix. The cells collected during the Pap test can be tested for HPV.  The HPV test can be used to screen women 29 years of age and older. Getting tested for HPV can extend the interval between normal Pap tests from three to five years.  An HPV test also should be used to screen women of any age who  have unclear Pap test results.  After 65 years of age, women should have HPV testing as often as Pap tests.  Colorectal Cancer  This type of cancer can be detected and often prevented.  Routine colorectal cancer screening usually begins at 65 years of age and continues through 65 years of age.  Your health care provider may recommend screening at an earlier age if you have risk factors for colon cancer.  Your health care provider may also recommend using home test kits to check for hidden blood in the stool.  A small camera at the end of a tube can be used to examine your colon directly (sigmoidoscopy or colonoscopy). This is done to check for the earliest forms of colorectal cancer.  Routine screening usually begins at age 65.  Direct examination of the colon  should be repeated every 5-10 years through 65 years of age. However, you may need to be screened more often if early forms of precancerous polyps or small growths are found. Skin Cancer  Check your skin from head to toe regularly.  Tell your health care provider about any new moles or changes in moles, especially if there is a change in a mole's shape or color.  Also tell your health care provider if you have a mole that is larger than the size of a pencil eraser.  Always use sunscreen. Apply sunscreen liberally and repeatedly throughout the day.  Protect yourself by wearing long sleeves, pants, a wide-brimmed hat, and sunglasses whenever you are outside. HEART DISEASE, DIABETES, AND HIGH BLOOD PRESSURE   Have your blood pressure checked at least every 1-2 years. High blood pressure causes heart disease and increases the risk of stroke.  If you are between 11 years and 52 years old, ask your health care provider if you should take aspirin to prevent strokes.  Have regular diabetes screenings. This involves taking a blood sample to check your fasting blood sugar level.  If you are at a normal weight and have a low risk for  diabetes, have this test once every three years after 65 years of age.  If you are overweight and have a high risk for diabetes, consider being tested at a younger age or more often. PREVENTING INFECTION  Hepatitis B  If you have a higher risk for hepatitis B, you should be screened for this virus. You are considered at high risk for hepatitis B if:  You were born in a country where hepatitis B is common. Ask your health care provider which countries are considered high risk.  Your parents were born in a high-risk country, and you have not been immunized against hepatitis B (hepatitis B vaccine).  You have HIV or AIDS.  You use needles to inject street drugs.  You live with someone who has hepatitis B.  You have had sex with someone who has hepatitis B.  You get hemodialysis treatment.  You take certain medicines for conditions, including cancer, organ transplantation, and autoimmune conditions. Hepatitis C  Blood testing is recommended for:  Everyone born from [birth year] through 1966.  Anyone with known risk factors for hepatitis C. Sexually transmitted infections (STIs)  You should be screened for sexually transmitted infections (STIs) including gonorrhea and chlamydia if:  You are sexually active and are younger than 65 years of age.  You are older than 65 years of age and your health care provider tells you that you are at risk for this type of infection.  Your sexual activity has changed since you were last screened and you are at an increased risk for chlamydia or gonorrhea. Ask your health care provider if you are at risk.  If you do not have HIV, but are at risk, it may be recommended that you take a prescription medicine daily to prevent HIV infection. This is called pre-exposure prophylaxis (PrEP). You are considered at risk if:  You are sexually active and do not regularly use condoms or know the HIV status of your partner(s).  You take drugs by injection.  You are  sexually active with a partner who has HIV. Talk with your health care provider about whether you are at high risk of being infected with HIV. If you choose to begin PrEP, you should first be tested for HIV. You should then be tested every 3 months for  as long as you are taking PrEP.  PREGNANCY   If you are premenopausal and you may become pregnant, ask your health care provider about preconception counseling.  If you may become pregnant, take 400 to 800 micrograms (mcg) of folic acid every day.  If you want to prevent pregnancy, talk to your health care provider about birth control (contraception). OSTEOPOROSIS AND MENOPAUSE   Osteoporosis is a disease in which the bones lose minerals and strength with aging. This can result in serious bone fractures. Your risk for osteoporosis can be identified using a bone density scan.  If you are 7 years of age or older, or if you are at risk for osteoporosis and fractures, ask your health care provider if you should be screened.  Ask your health care provider whether you should take a calcium or vitamin D supplement to lower your risk for osteoporosis.  Menopause may have certain physical symptoms and risks.  Hormone replacement therapy may reduce some of these symptoms and risks. Talk to your health care provider about whether hormone replacement therapy is right for you.  HOME CARE INSTRUCTIONS   Schedule regular health, dental, and eye exams.  Stay current with your immunizations.   Do not use any tobacco products including cigarettes, chewing tobacco, or electronic cigarettes.  If you are pregnant, do not drink alcohol.  If you are breastfeeding, limit how much and how often you drink alcohol.  Limit alcohol intake to no more than 1 drink per day for nonpregnant women. One drink equals 12 ounces of beer, 5 ounces of wine, or 1 ounces of hard liquor.  Do not use street drugs.  Do not share needles.  Ask your health care provider for  help if you need support or information about quitting drugs.  Tell your health care provider if you often feel depressed.  Tell your health care provider if you have ever been abused or do not feel safe at home. Document Released: 12/15/2010 Document Revised: 10/16/2013 Document Reviewed: 05/03/2013 [facility] Patient Information 2015 [place], [place]. This information is not intended to replace advice given to you by your health care provider. Make sure you discuss any questions you have with your health care provider.

## 2014-10-01 NOTE — Assessment & Plan Note (Signed)
Has been in remission sice last treatemetn May 2011.  followedby Pandit annually .  Treated with chemo and XRT

## 2014-10-02 ENCOUNTER — Encounter: Payer: Self-pay | Admitting: Internal Medicine

## 2014-10-02 ENCOUNTER — Ambulatory Visit: Admit: 2014-10-02 | Disposition: A | Discharge status: Home / Self Care | Payer: Self-pay | Admitting: Internal Medicine

## 2014-10-02 NOTE — Assessment & Plan Note (Signed)
EKG today showed no acute changes.  She has known CAD and last stress test was negative for reversible changes in 2014.  She has been advised to follow up with dr [name] and to use her SL ntg as needed x 3, and go to ER if pain is nor resolved.

## 2014-10-02 NOTE — Assessment & Plan Note (Signed)
PAP was done' polyp was friable,  Referral to GYN for evaluation was advised and accepted.

## 2014-10-02 NOTE — Assessment & Plan Note (Signed)
I have addressed  BMI and recommended wt loss of 10% of body weigh over the next 6 months using a low glycemic index diet and regular exercise a minimum of 5 days per week.  Body mass index is 34.2 kg/(m^2).

## 2014-10-02 NOTE — Assessment & Plan Note (Signed)
Plain films were normal in 2014,  But right hip films have been reordered given severe pain noted today with minor changes in position. Referral to Ortho planned pending evaluation of joint space.

## 2014-10-02 NOTE — Assessment & Plan Note (Signed)
DG esophagus ordered to rule out stricture given history of chest irradiation.  If normal will need EGD

## 2014-10-02 NOTE — Assessment & Plan Note (Signed)

## 2014-10-02 NOTE — Assessment & Plan Note (Signed)
Managed with  High potency statin ,  Low dose.   Dr. [name] has been refilling her statin.  Her last liver enzyme was April 2014.  Se is not fasting currently and has been asked to return ASAP for fasting lipids.   Lab Results  Component Value Date   CHOL 107 09/23/2012   HDL 43.00 09/23/2012   LDLCALC 45 09/23/2012   TRIG 96.0 09/23/2012   CHOLHDL 2 09/23/2012   Lab Results  Component Value Date   ALT 22 10/11/2012   AST 23 10/11/2012   ALKPHOS 57 10/11/2012   BILITOT 1.2 10/11/2012

## 2014-10-03 LAB — CYTOLOGY - PAP

## 2014-10-04 ENCOUNTER — Telehealth: Payer: Self-pay | Admitting: Internal Medicine

## 2014-10-04 DIAGNOSIS — M25552 Pain in left hip: Principal | ICD-10-CM

## 2014-10-04 DIAGNOSIS — M25551 Pain in right hip: Secondary | ICD-10-CM

## 2014-10-04 NOTE — Telephone Encounter (Signed)
My Chart message sent

## 2014-10-05 ENCOUNTER — Encounter: Payer: Self-pay | Admitting: Internal Medicine

## 2014-10-24 ENCOUNTER — Other Ambulatory Visit (INDEPENDENT_AMBULATORY_CARE_PROVIDER_SITE_OTHER): Payer: PPO

## 2014-10-24 DIAGNOSIS — M25552 Pain in left hip: Secondary | ICD-10-CM | POA: Diagnosis not present

## 2014-10-24 DIAGNOSIS — E559 Vitamin D deficiency, unspecified: Secondary | ICD-10-CM

## 2014-10-24 DIAGNOSIS — M25551 Pain in right hip: Secondary | ICD-10-CM | POA: Diagnosis not present

## 2014-10-24 DIAGNOSIS — E785 Hyperlipidemia, unspecified: Secondary | ICD-10-CM

## 2014-10-24 DIAGNOSIS — Z1159 Encounter for screening for other viral diseases: Secondary | ICD-10-CM

## 2014-10-24 LAB — C-REACTIVE PROTEIN: CRP: 0.2 mg/dL — ABNORMAL LOW (ref 0.5–20.0)

## 2014-10-24 LAB — CBC WITH DIFFERENTIAL/PLATELET
Basophils Absolute: 0 10*3/uL (ref 0.0–0.1)
Basophils Relative: 0.4 % (ref 0.0–3.0)
Eosinophils Absolute: 0.1 10*3/uL (ref 0.0–0.7)
Eosinophils Relative: 1.3 % (ref 0.0–5.0)
HCT: 43.9 % (ref 36.0–46.0)
Hemoglobin: 14.6 g/dL (ref 12.0–15.0)
Lymphocytes Relative: 18.8 % (ref 12.0–46.0)
Lymphs Abs: 1.3 10*3/uL (ref 0.7–4.0)
MCHC: 33.2 g/dL (ref 30.0–36.0)
MCV: 89.1 fl (ref 78.0–100.0)
Monocytes Absolute: 0.5 10*3/uL (ref 0.1–1.0)
Monocytes Relative: 7.2 % (ref 3.0–12.0)
Neutro Abs: 5.1 10*3/uL (ref 1.4–7.7)
Neutrophils Relative %: 72.3 % (ref 43.0–77.0)
Platelets: 234 10*3/uL (ref 150.0–400.0)
RBC: 4.93 Mil/uL (ref 3.87–5.11)
RDW: 14.3 % (ref 11.5–15.5)
WBC: 7.1 10*3/uL (ref 4.0–10.5)

## 2014-10-24 LAB — COMPREHENSIVE METABOLIC PANEL
ALT: 24 U/L (ref 0–35)
AST: 25 U/L (ref 0–37)
Albumin: 4.1 g/dL (ref 3.5–5.2)
Alkaline Phosphatase: 67 U/L (ref 39–117)
BUN: 17 mg/dL (ref 6–23)
CO2: 31 mEq/L (ref 19–32)
Calcium: 9.7 mg/dL (ref 8.4–10.5)
Chloride: 103 mEq/L (ref 96–112)
Creatinine, Ser: 0.9 mg/dL (ref 0.40–1.20)
GFR: 66.78 mL/min (ref >60.00)
Glucose, Bld: 102 mg/dL — ABNORMAL HIGH (ref 70–99)
Potassium: 5.1 mEq/L (ref 3.5–5.1)
Sodium: 139 mEq/L (ref 135–145)
Total Bilirubin: 1.7 mg/dL — ABNORMAL HIGH (ref 0.2–1.2)
Total Protein: 6.6 g/dL (ref 6.0–8.3)

## 2014-10-24 LAB — LIPID PANEL
Cholesterol: 114 mg/dL (ref 0–200)
HDL: 54.6 mg/dL (ref >39.00)
LDL Cholesterol: 40 mg/dL (ref 0–99)
NonHDL: 59.4
Total CHOL/HDL Ratio: 2
Triglycerides: 95 mg/dL (ref 0.0–149.0)
VLDL: 19 mg/dL (ref 0.0–40.0)

## 2014-10-24 LAB — VITAMIN D 25 HYDROXY (VIT D DEFICIENCY, FRACTURES): VITD: 74.42 ng/mL (ref 30.00–100.00)

## 2014-10-24 LAB — SEDIMENTATION RATE: Sed Rate: 10 mm/hr (ref 0–22)

## 2014-10-24 LAB — TSH: TSH: 3.7 u[IU]/mL (ref 0.35–4.50)

## 2014-10-25 LAB — HEPATITIS C ANTIBODY: HCV Ab: NEGATIVE

## 2014-10-26 ENCOUNTER — Ambulatory Visit (INDEPENDENT_AMBULATORY_CARE_PROVIDER_SITE_OTHER): Payer: PPO | Admitting: Cardiovascular Disease

## 2014-10-26 ENCOUNTER — Encounter: Payer: Self-pay | Admitting: Cardiovascular Disease

## 2014-10-26 VITALS — BP 130/78 | HR 80 | Ht 61.0 in | Wt 193.2 lb

## 2014-10-26 DIAGNOSIS — I25118 Atherosclerotic heart disease of native coronary artery with other forms of angina pectoris: Secondary | ICD-10-CM

## 2014-10-26 DIAGNOSIS — R079 Chest pain, unspecified: Secondary | ICD-10-CM

## 2014-10-26 DIAGNOSIS — E785 Hyperlipidemia, unspecified: Secondary | ICD-10-CM | POA: Diagnosis not present

## 2014-10-26 NOTE — Progress Notes (Signed)
HPI  This is a 64 year old female who is here today for a followup visit regarding coronary artery disease.  She has history of Hodgkin's lymphoma treated with radiation and chemotherapy to the chest area and has been in remission since 2011. She does have pulmonary nodules which are being followed regularly every 6 months. She is a former smoker. Stress echocardiogram in 02/2012 showed evidence of severe anterior wall ischemia. She underwent cardiac catheterization  which showed a 95% stenosis in the proximal LAD as well as 90% ostial first diagonal stenosis. This was a bifurcation stenosis. She underwent a complex PCI with balloon angioplasty of first diagonal  as well as a drug-eluting stent placement to the proximal LAD. She had recurrent intermittent chest pain after that. A treadmill nuclear stress test in September of 2014 showed a fixed anterior wall defect with minor reversibility. Ejection fraction was normal. She has been under increased stress recently due to illness of her husband. She reports intermittent substernal chest pain and burning sensation in the lower part of the chest and epigastric area. This can happen with activities or at rest.   Allergies  Allergen Reactions  . Antifungal [Miconazole Nitrate]   . Sulfa Antibiotics   . Z-Pak [Azithromycin]      Current Outpatient Prescriptions on File Prior to Visit  Medication Sig Dispense Refill  . aspirin 81 MG tablet Take 81 mg by mouth daily.    Marland Kitchen atorvastatin (LIPITOR) 10 MG tablet TAKE ONE TABLET BY MOUTH ONCE DAILY 90 tablet 3  . BIOTIN PO Take by mouth daily.    . Cholecalciferol (VITAMIN D-3) 1000 UNITS CAPS Take 1 capsule by mouth daily.    . metoprolol tartrate (LOPRESSOR) 25 MG tablet TAKE ONE TABLET BY MOUTH TWICE DAILY 180 tablet 3  . nitroGLYCERIN (NITROSTAT) 0.4 MG SL tablet Place 1 tablet (0.4 mg total) under the tongue every 5 (five) minutes as needed for chest pain. 25 tablet 3  . pyridOXINE (VITAMIN B-6)  100 MG tablet Take 100 mg by mouth daily.     No current facility-administered medications on file prior to visit.     Past Medical History  Diagnosis Date  . Essential hypertension, benign   . Chronic airway obstruction, not elsewhere classified   . Cervicalgia   . Osteoarthritis   . Fibrocystic breast disease   . Polycystic ovarian disease   . History of anemia   . Gestational hypertension   . Hodgkin's lymphoma     s/p radiation and chemo therapy  . Coronary artery disease 02/2012    Abnormal stress test with anterior wall ischemia. Cardiac catheterization showed a 95% stenosis in the proximal LAD bifurcating with a 90% stenosis in first diagonal. Ejection fraction was 45% with anterior wall hypokinesis. She underwent balloon angioplasty to ostial first diagonal and drug-eluting stent placement to proximal LAD with a 3.0 x 15 mm Xience EX drug-eluting stent  . Other and unspecified hyperlipidemia   . Heart murmur   . Heart disease   . History of blood transfusion      Past Surgical History  Procedure Laterality Date  . Bladder sling    . Cardiac catheterization  02/2012    [facility]  . Coronary angioplasty  02/2012    left/right s/p balloon  . Cholecystectomy  1982  . Transobturator sling N/A 2009    [place]  . Heart stent'  2013  . Cervical polypectomy       Family History  Problem Relation  Age of Onset  . ALS Father   . Polymyositis Father   . Diabetes Brother   . Cancer Maternal Aunt     breast  . Stroke Maternal Grandmother   . Cancer Maternal Grandfather     prostate  . Stroke Maternal Grandfather      History   Social History  . Marital Status: Married    Spouse Name: N/A  . Number of Children: N/A  . Years of Education: N/A   Occupational History  . Not on file.   Social History Main Topics  . Smoking status: Former Smoker -- 1.00 packs/day for 30 years    Types: Cigarettes  . Smokeless tobacco: Not on file  . Alcohol Use: Yes     Comment:  occasional  . Drug Use: No  . Sexual Activity: Not on file   Other Topics Concern  . Not on file   Social History Narrative     PHYSICAL EXAM   BP 130/78 mmHg  Pulse 80  Ht 5\' 1"  (1.549 m)  Wt 193 lb 4 oz (87.658 kg)  BMI 36.53 kg/m2  Constitutional: She is oriented to person, place, and time. She appears well-developed and well-nourished. No distress.  HENT: No nasal discharge.  Head: Normocephalic and atraumatic.  Eyes: Pupils are equal and round. Right eye exhibits no discharge. Left eye exhibits no discharge.  Neck: Normal range of motion. Neck supple. No JVD present. No thyromegaly present.  Cardiovascular: Normal rate, regular rhythm, normal heart sounds. Exam reveals no gallop and no friction rub. No murmur heard.  Pulmonary/Chest: Effort normal and breath sounds normal. No stridor. No respiratory distress. She has no wheezes. She has no rales. She exhibits no tenderness.  Abdominal: Soft. Bowel sounds are normal. She exhibits no distension. There is no tenderness. There is no rebound and no guarding.  Musculoskeletal: Normal range of motion. She exhibits no edema and no tenderness.  Neurological: She is alert and oriented to person, place, and time. Coordination normal.  Skin: Skin is warm and dry. No rash noted. She is not diaphoretic. No erythema. No pallor.  Psychiatric: She has a normal mood and affect. Her behavior is normal. Judgment and thought content normal.   EKG: Normal sinus rhythm  ASSESSMENT AND PLAN

## 2014-10-26 NOTE — Assessment & Plan Note (Signed)
The patient is having intermittent chest pain. This has been a recurrent issue since after her PCI but seems to have worsened recently likely due to stress related to illness of her husband. Some of her symptoms are exertional and others are at rest. There is possibility of GERD given the burning sensation. ECG is normal. She had a stress test done in September 2014 which showed no convincing evidence of ischemia. I suggested proceeding with a repeat stress test for evaluation but she prefers to hold off until she gets checked for other noncardiac causes. I instructed her to call us if symptoms worsen. Otherwise she will follow-up in 6 months.

## 2014-10-26 NOTE — Patient Instructions (Signed)
Medication Instructions: No change  Labwork: none  Procedures/Testing: None  Follow-Up: 6 months with Dr. [name]  Any Additional Special Instructions Will Be Listed Below (If Applicable).

## 2014-10-26 NOTE — Assessment & Plan Note (Signed)
Lab Results  Component Value Date   CHOL 114 10/24/2014   HDL 54.60 10/24/2014   LDLCALC 40 10/24/2014   TRIG 95.0 10/24/2014   CHOLHDL 2 10/24/2014   Continue treatment with atorvastatin.

## 2014-10-28 ENCOUNTER — Encounter: Payer: Self-pay | Admitting: Internal Medicine

## 2014-10-29 ENCOUNTER — Encounter: Payer: Self-pay | Admitting: Internal Medicine

## 2014-10-29 ENCOUNTER — Ambulatory Visit (INDEPENDENT_AMBULATORY_CARE_PROVIDER_SITE_OTHER): Payer: PPO | Admitting: Internal Medicine

## 2014-10-29 VITALS — BP 108/62 | HR 81 | Temp 97.8°F | Resp 14 | Ht 61.0 in | Wt 193.2 lb

## 2014-10-29 DIAGNOSIS — E785 Hyperlipidemia, unspecified: Secondary | ICD-10-CM

## 2014-10-29 DIAGNOSIS — M25551 Pain in right hip: Secondary | ICD-10-CM

## 2014-10-29 DIAGNOSIS — E669 Obesity, unspecified: Secondary | ICD-10-CM | POA: Diagnosis not present

## 2014-10-29 DIAGNOSIS — R1314 Dysphagia, pharyngoesophageal phase: Secondary | ICD-10-CM | POA: Diagnosis not present

## 2014-10-29 DIAGNOSIS — M25552 Pain in left hip: Secondary | ICD-10-CM

## 2014-10-29 NOTE — Progress Notes (Signed)
Pre visit review using our clinic review tool, if applicable. No additional management support is needed unless otherwise documented below in the visit note. 

## 2014-10-29 NOTE — Progress Notes (Signed)
Patient ID: Jaime Hall, female   DOB: June 15, 1951, 64 y.o.   MRN: [id number]   Patient Active Problem List   Diagnosis Date Noted  . Cervical polyp 10/01/2014  . Dysphagia, pharyngoesophageal phase 10/01/2014  . Prolapsed, uterovaginal, incomplete 06/24/2014  . Routine general medical examination at a health care facility 12/30/2012  . Obesity (BMI 30-39.9) 12/30/2012  . Hx of multiple pulmonary nodules 12/28/2012  . Plantar fasciitis of right foot 12/28/2012  . 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 Edema of both legs 09/12/2012  . Hip pain, bilateral 09/12/2012  . Hx of adenomatous colonic polyps 09/12/2012  . History of hodgkin's lymphoma 09/12/2012  . Coronary artery disease   . Hyperlipidemia   . Chest pain on exertion 02/29/2012    Subjective:  CC:   Chief Complaint  Patient presents with  . Follow-up    4 week followup    HPI:   Jaime Hall is a 64 y.o. female who presents for   Follow up on multiple issues raised at last visit, including obesity,  Hip pain dysphagia and cervical polyp.   Wt Readings from Last 3 Encounters:  10/29/14 193 lb 4 oz (87.658 kg)  10/26/14 193 lb 4 oz (87.658 kg)  10/01/14 193 lb (87.544 kg)  BP 108/62 mmHg  Pulse 81  Temp(Src) 97.8 F (36.6 C) (Oral)  Resp 14  Ht 5\' 1"  (1.549 m)  Wt 193 lb 4 oz (87.658 kg)  BMI 36.53 kg/m2  SpO2 98%   Hip films were normal.  Sees Dr Laveda Abbe for low back pain.  She was treated since she saw me with partial relief  Aggravated by turning over in bed .  Taking aleve at night which is helpimg  DG esophagus ordered but has not been called yet for appt, Occasional dysphagia for solids  Saw GYN for cervical polyp biopsy was done two weeks ago    Past Medical History  Diagnosis Date  . Essential hypertension, benign   . Chronic airway obstruction, not elsewhere classified   . Cervicalgia   . Osteoarthritis   . Fibrocystic breast disease   . Polycystic ovarian disease   .  History of anemia   . Gestational hypertension   . Hodgkin's lymphoma     s/p radiation and chemo therapy  . Coronary artery disease 02/2012    Abnormal stress test with anterior wall ischemia. Cardiac catheterization showed a 95% stenosis in the proximal LAD bifurcating with a 90% stenosis in first diagonal. Ejection fraction was 45% with anterior wall hypokinesis. She underwent balloon angioplasty to ostial first diagonal and drug-eluting stent placement to proximal LAD with a 3.0 x 15 mm Xience EX drug-eluting stent  . Other and unspecified hyperlipidemia   . Heart murmur   . Heart disease   . History of blood transfusion     Past Surgical History  Procedure Laterality Date  . Bladder sling    . Cardiac catheterization  02/2012    [facility]  . Coronary angioplasty  02/2012    left/right s/p balloon  . Cholecystectomy  1982  . Transobturator sling N/A 2009    [place]  . Heart stent'  2013  . Cervical polypectomy         The following portions of the patient's history were reviewed and updated as appropriate: Allergies, current medications, and problem list.    Review of Systems:   Patient denies headache, fevers, malaise, unintentional weight loss, skin rash, eye pain, sinus congestion and  sinus pain, sore throat, dysphagia,  hemoptysis , cough, dyspnea, wheezing, chest pain, palpitations, orthopnea, edema, abdominal pain, nausea, melena, diarrhea, constipation, flank pain, dysuria, hematuria, urinary  Frequency, nocturia, numbness, tingling, seizures,  Focal weakness, Loss of consciousness,  Tremor, insomnia, depression, anxiety, and suicidal ideation.     History   Social History  . Marital Status: Married    Spouse Name: N/A  . Number of Children: N/A  . Years of Education: N/A   Occupational History  . Not on file.   Social History Main Topics  . Smoking status: Former Smoker -- 1.00 packs/day for 30 years    Types: Cigarettes  . Smokeless tobacco: Not on file   . Alcohol Use: Yes     Comment: occasional  . Drug Use: No  . Sexual Activity: Not on file   Other Topics Concern  . Not on file   Social History Narrative    Objective:  Filed Vitals:   10/29/14 1443  BP: 108/62  Pulse: 81  Temp: 97.8 F (36.6 C)  Resp: 14     General appearance: alert, cooperative and appears stated age Ears: normal TM's and external ear canals both ears Throat: lips, mucosa, and tongue normal; teeth and gums normal Neck: no adenopathy, no carotid bruit, supple, symmetrical, trachea midline and thyroid not enlarged, symmetric, no tenderness/mass/nodules Back: symmetric, no curvature. ROM normal. No CVA tenderness. Lungs: clear to auscultation bilaterally Heart: regular rate and rhythm, S1, S2 normal, no murmur, click, rub or gallop Abdomen: soft, non-tender; bowel sounds normal; no masses,  no organomegaly Pulses: 2+ and symmetric Skin: Skin color, texture, turgor normal. No rashes or lesions Lymph nodes: Cervical, supraclavicular, and axillary nodes normal.  Assessment and Plan:  Hyperlipidemia Managed with  High potency statin ,  Low dose.    Lab Results  Component Value Date   CHOL 114 10/24/2014   HDL 54.60 10/24/2014   LDLCALC 40 10/24/2014   TRIG 95.0 10/24/2014   CHOLHDL 2 10/24/2014   Lab Results  Component Value Date   ALT 24 10/24/2014   AST 25 10/24/2014   ALKPHOS 67 10/24/2014   BILITOT 1.7* 10/24/2014        Hip pain, bilateral Improved with chiropractic manipulation,  Hip films were normal   Obesity (BMI 30-39.9) I spent 15 minutes addressing  BMI and recommended wt loss of 10% of body weigh over the next 6 months using a low glycemic index diet and regular exercise a minimum of 5 days per week. She has had difficulty losing weight due to increased appetite.  Thyroid function is normal and there are no signs of diabetes on screening labs today     Dysphagia, pharyngoesophageal phase Awaiting swallow evaluation to  rule out stricture fiiven prior history of XRT for lymphoma.     Updated Medication List Outpatient Encounter Prescriptions as of 10/29/2014  Medication Sig  . aspirin 81 MG tablet Take 81 mg by mouth daily.  Marland Kitchen atorvastatin (LIPITOR) 10 MG tablet TAKE ONE TABLET BY MOUTH ONCE DAILY  . BIOTIN PO Take by mouth daily.  . Cholecalciferol (VITAMIN D-3) 1000 UNITS CAPS Take 1 capsule by mouth daily.  . metoprolol tartrate (LOPRESSOR) 25 MG tablet TAKE ONE TABLET BY MOUTH TWICE DAILY  . nitroGLYCERIN (NITROSTAT) 0.4 MG SL tablet Place 1 tablet (0.4 mg total) under the tongue every 5 (five) minutes as needed for chest pain.  Marland Kitchen pyridOXINE (VITAMIN B-6) 100 MG tablet Take 100 mg by mouth daily.  No facility-administered encounter medications on file as of 10/29/2014.     No orders of the defined types were placed in this encounter.    Return in about 6 months (around 05/01/2015).

## 2014-10-29 NOTE — Patient Instructions (Addendum)
You can take one aleve and 2 tylenol  every night for your hip and back pain   I am still waiting on the swallow evaluation  To investigate your swallowing issues   You can schedule your own mammogram   Body mass index is 36.53 kg/(m^2).   I want you to get to 158 lbs to get your BMI < 30     that's a 33 lb wt loss   If you lose 4 lb/smonth you will be there by new Years!  This is Dr. Lupita Dawn version of a  "Low GI"  Weight loss Diet.  It is appropriate for all patients with normal renal function , gluten tolerance, and advised for patients who have prediabetes or diabetes:   All of the foods can be found at grocery stores and in bulk at [employer].  The Atkins protein bars and shakes are available in more varieties at [employer], [employer] and [place].     7 AM Breakfast:  Choose from the following:  < 5 carbs  Weekdays: Low carbohydrate Protein  Shakes (EAS AdvantEdge "Carb  Control" shakes, Atkins,  Muscle Milk or Premier Protein shakes)     Weekends:  a scrambled egg/bacon/cheese burrito made with Mission's "carb  balance" whole wheat tortilla  (about 10 net carbs )  Eggs,  bacon /sausage , Joseph's pita /lavash bread or  (5 carbs)  A slice of fritatta ( egg based baked dish, no  crust:  google it) (< 10 carbs)   Avoid cereal and bananas, oatmeal and cream of wheat and grits. They are loaded with carbohydrates!   10 AM: high protein snack  (< 5 carbs)   Protein bar by Atkins  Or KIND  (the snack size, < 200 cal, usually < 6 carbs    A stick of cheese:  Around 1 carb,  100 cal      Other so called "protein bars" tend to be loaded with carbohydrates.  Remember, in food advertising, the word "energy" is synonymous for " carbohydrate."  Lunch:   A Sandwich using the bread choices listed, Can use any  Eggs,  lunchmeat, grilled meat or canned tuna).  Can add avocado, regular mayo/mustard  and cheese.  A Salad using blue cheese, ranch,  Goddess dressing  or vinagrette,  No croutons or  "confetti" and no "candied nuts" but regular nuts OK.   2 HARD BOILED EGG WHITES AND A CUP OF one of these greek yogurts:    dannon lt n fit greek yogurt         chobani 100 greek yogurt,    Oikos triple zero greek yogurt       No pretzels or chips.  Pickles and miniature sweet peppers are a good low  carb alternative that provide a "crunch"  The bread is the only source of carbohydrate in a sandwich and  can be decreased by trying some of these alternatives to traditional loaf bread:   Joseph's pita bread and Lavash (flat) bread :  50 cal and 4 net carbs  available at [employer] and [employer].  Taste better when toasted, use as pita chips  [employer] makes a variety of  flatbreads and  A PITA POCKET.    LOOK FOR  THE ONES THAT ARE 17 NET CARBS OR LESS    M[employer] makes 2 sizes of  Low carb whole wheat tortillas  (The large one is  210 cal and 6 net carbs)   Avoid "Low fat  dressings, as well as Brandon dressings    3 PM/ Mid day  Snack:  Consider  1 ounce of  almonds, walnuts, pistachios, pecans, peanuts,  Macadamia nuts or a nut medley that does not contain raisins or cranberries.  No "granola"; the dried cranberries and raisins are loaded with carbohydrates. Mixed nuts as long as there are no raisins,  cranberries or dried fruit.    Try the prosciutto/mozzarella cheese sticks by [employer]  In deli /backery section   High protein   To avoid overindulging in snacks: Try drinking a glass of unsweeted almond/coconut milk  Or a cup of coffee with your Atkins chocolate bar to keep you from having 3!!!   Pork rinds!  Yes Pork Rinds are low carb potato chip substitute!   Toasted Joseph's flatbread with hummous dip (chickpeas)       6 PM  Dinner:     Meat/fowl/fish with a green salad, and either broccoli, cauliflower, green beans, spinach, brussel sprouts, bok choy or  Lima beans. Fried in canola oil /olive oil BUT DO NOT BREAD THE PROTEIN!!      There is a low carb pasta by Dreamfield's  that is acceptable and tastes great: only 5 digestible carbs/serving.( All grocery stores but BJs carry it )  Prepared Meals:  Try Hurley Cisco Angelo's chicken piccata or chicken or eggplant parm over low carb pasta.(Lowes and BJs)   Marjory Lies Sanchez's "Carnitas" (pulled pork, no sauce,  0 carbs) or his beef pot roast to make a dinner burrito (at [employer])  Barbecue with cole slaw is low carb BUT NO BUN!  SAME WITH HAMBURGERS     Whole wheat pasta is still full of digestible carbs and  Not as low in glycemic index as Dreamfield's.   Brown rice is still rice,  So skip the rice and noodles if you eat [place] or [place] (or at least limit to 1/2 cup)  9 PM snack :   Breyer's "low carb" fudgsicle or  ice cream bar (Carb Smart line), or  Weight  Watcher's ice cream bar , or another "no sugar added" ice cream;  a serving of fresh berries/cherries with whipped cream   Cheese or greek yogurt   8 ounces of [place] unsweetened almond/cococunut milk  Cheese and crackers (using WASA crackers,  They are low carb) or peanut butter on low carb crackers or pita bread     Avoid bananas, pineapple, grapes  and watermelon on a regular basis because they are high in sugar.  THINK OF THEM AS DESSERT and do not have daily   Remember that snack Substitutions should be less than 10 NET carbs per serving and meals should be < 20 net carbs. Remember that carbohydrates from fiber do not affect blood sugar, so you can  subtract fiber grams to get the "net carbs " of any particular food item.   Marland Kitchen

## 2014-10-30 NOTE — Assessment & Plan Note (Signed)
Improved with chiropractic manipulation,  Hip films were normal

## 2014-10-30 NOTE — Assessment & Plan Note (Signed)
I spent 15 minutes addressing  BMI and recommended wt loss of 10% of body weigh over the next 6 months using a low glycemic index diet and regular exercise a minimum of 5 days per week. She has had difficulty losing weight due to increased appetite.  Thyroid function is normal and there are no signs of diabetes on screening labs today

## 2014-10-30 NOTE — Assessment & Plan Note (Signed)
Managed with  High potency statin ,  Low dose.    Lab Results  Component Value Date   CHOL 114 10/24/2014   HDL 54.60 10/24/2014   LDLCALC 40 10/24/2014   TRIG 95.0 10/24/2014   CHOLHDL 2 10/24/2014   Lab Results  Component Value Date   ALT 24 10/24/2014   AST 25 10/24/2014   ALKPHOS 67 10/24/2014   BILITOT 1.7* 10/24/2014

## 2014-10-30 NOTE — Assessment & Plan Note (Signed)
Awaiting swallow evaluation to rule out stricture fiiven prior history of XRT for lymphoma.

## 2014-11-05 ENCOUNTER — Other Ambulatory Visit: Payer: Self-pay | Admitting: Internal Medicine

## 2014-11-05 ENCOUNTER — Other Ambulatory Visit: Payer: Self-pay

## 2014-11-05 DIAGNOSIS — R1310 Dysphagia, unspecified: Secondary | ICD-10-CM

## 2014-11-13 ENCOUNTER — Ambulatory Visit: Payer: PPO

## 2014-11-28 ENCOUNTER — Ambulatory Visit
Admission: RE | Admit: 2014-11-28 | Discharge: 2014-11-28 | Disposition: A | Discharge status: Home / Self Care | Payer: PPO | Source: Ambulatory Visit | Attending: Internal Medicine | Admitting: Internal Medicine

## 2014-11-28 ENCOUNTER — Other Ambulatory Visit: Payer: Self-pay | Admitting: Internal Medicine

## 2014-11-28 DIAGNOSIS — I10 Essential (primary) hypertension: Secondary | ICD-10-CM | POA: Diagnosis not present

## 2014-11-28 DIAGNOSIS — D649 Anemia, unspecified: Secondary | ICD-10-CM | POA: Diagnosis not present

## 2014-11-28 DIAGNOSIS — N6019 Diffuse cystic mastopathy of unspecified breast: Secondary | ICD-10-CM | POA: Diagnosis not present

## 2014-11-28 DIAGNOSIS — R1310 Dysphagia, unspecified: Secondary | ICD-10-CM

## 2014-11-28 DIAGNOSIS — I2510 Atherosclerotic heart disease of native coronary artery without angina pectoris: Secondary | ICD-10-CM | POA: Diagnosis not present

## 2014-11-28 DIAGNOSIS — C8190 Hodgkin lymphoma, unspecified, unspecified site: Secondary | ICD-10-CM | POA: Insufficient documentation

## 2014-11-28 DIAGNOSIS — Z9221 Personal history of antineoplastic chemotherapy: Secondary | ICD-10-CM | POA: Insufficient documentation

## 2014-11-28 NOTE — Therapy (Signed)
[place], [place], [address] Phone: [phone]   Fax:     Modified Barium Swallow  Patient Details  Name: Jaime Hall MRN: [id number] Date of Birth: 06/29/1950 Referring Provider:  [name], MD  Encounter Date: 11/28/2014   Subjective: Patient behavior: (alertness, ability to follow instructions, etc.): ptA/O x4; followed all instruction appropriately. Chief complaint: a "burning sensation" in the lower abdominal area; report by pt of the suggestion of Reflux post a Barium Study ~2 yrs ago. Pt indicated that this feeling could occur anytime during the day but that she did not feel it caused her any certain discomfort if trying to eat/swallow. She stated she did have trouble swallowing post Ca of Lymph nodes for which they treated w/ Radiation from the neck to the mid-sternum area ~5 years ago. Pt denied being on a PPI but stated she does take Tums when necessary (<1x week). Pt denied any other medications except 2 heart medications; no other medical diagnosis. Pt stated she felt she had been under increased stressed in the past few months.    Objective:  Radiological Procedure: A videoflouroscopic evaluation of oral-preparatory, reflex initiation, and pharyngeal phases of the swallow was performed; as well as a screening of the upper esophageal phase.  I. POSTURE: upright II. VIEW: lateral III. COMPENSATORY STRATEGIES: time b/t trials to allow for independent swallowing to aid pharyngeal and Esophageal clearing.  IV. BOLUSES ADMINISTERED:  Thin Liquid: 5 trials via cup  Nectar-thick Liquid: 1 trial via cup  Honey-thick Liquid: NT   Puree: 3 trials  Mechanical Soft: 1 trial V. RESULTS OF EVALUATION: A. ORAL PREPARATORY PHASE: (The lips, tongue, and velum are observed for strength and coordination)       **Overall Severity Rating: WFL. Pt demo. adequate bolus control and appropriate  mastication w/ A-P transfer for swallowing. Appropriate oral clearing noted b/t trials.   B. SWALLOW INITIATION/REFLEX: (The reflex is normal if "triggered" by the time the bolus reached the base of the tongue)  **Overall Severity Rating: [facility]. Pt demo. A fairly timely pharyngeal swallow w/ all bolus consistencies triggering the pharyngeal swallow b/t the base of tongue, and intermittently, at the level of the valleculae. No laryngeal penetration or aspiration occurred during this study w/ any consistency.  C. PHARYNGEAL PHASE: (Pharyngeal function is normal if the bolus shows rapid, smooth, and continuous transit through the pharynx and there is no pharyngeal residue after the swallow)  **Overall Severity Rating: WFL-Mild. Pt exhibited min. Retention of bolus material in the valleculae/pyriform sinuses inconsistently; time b/t trials allowed pt to use an independent, dry swallow to aid clearing of any min., diffuse pharyngeal residue.   D. LARYNGEAL PENETRATION: (Material entering into the laryngeal inlet/vestibule but not aspirated): None. E. ASPIRATION: None. F. ESOPHAGEAL PHASE: (Screening of the upper esophagus): Pt exhibited slow bolus motility through the lower cervical Esophagus w/ mild retention of bolus residue in the upper Esophagus. Noted a slight area of narrowing in the upper Esophagus which appeared d/t min. protrusion of a vertebrae into the Esophageal space. Time b/t trials and f/u, dry swallows appeared to aid upper Esophageal clearing of any bolus material lingering in this area. *Did not scan the mid-lower Esophagus during this study.    ASSESSMENT: Pt appears to present w/ grossly wfl oropharyngeal phase swallow function w/ all trial consistencies assessed today. Pt exhibited a grossly timely pharyngeal swallow initiation w/ no laryngeal penetration or aspiration occuring. Oral phase was  WFL. Of note, pt exhibited inconsistent, min. retention of bolus material in the  valleculae/pyriform sinuses; time b/t trials allowed for pt to use an independent, dry swallow to aid clearing of any diffuse pharyngeal residue. Suspect this presentation could be related to potential Esophageal dysmotility and/or reflux. During the Esophageal phase, pt exhibited slow bolus motility through the lower cervical Esophagus w/ mild retention of bolus residue. Time b/t trials and alternating food/liquid boluses appeared to aid clearing of the upper Esophageal residue. Suspect this could be related to pt's c/o lower Esophageal discomfort and would strongly recommend GI f/u to further assess and make tx recommendations, especially based on pt's report of "mild reflux" from the Barium study ~2 yrs ago.    PLAN/RECOMMENDATIONS:  A. Rec. Diet: regular w/ meats cut small/moistened foods; thin liquids  B. Swallowing Precautions: general aspiration precautions; Reflux precautions   C. Recommended consultation to: GI  D. Therapy recommendations: no skilled ST services indicated at this time.   E. Results and recommendations were discussed w/ pt; viewed video w/ pt.      End of Session - 11-29-2014 1730    Visit Number 1   Number of Visits 1   Date for SLP Re-Evaluation Nov 29, 2014   SLP Start Time 1300   SLP Stop Time  1400   SLP Time Calculation (min) 60 min   Activity Tolerance Patient tolerated treatment well      Past Medical History  Diagnosis Date  . Essential hypertension, benign   . Chronic airway obstruction, not elsewhere classified   . Cervicalgia   . Osteoarthritis   . Fibrocystic breast disease   . Polycystic ovarian disease   . History of anemia   . Gestational hypertension   . Hodgkin's lymphoma     s/p radiation and chemo therapy  . Coronary artery disease 02/2012    Abnormal stress test with anterior wall ischemia. Cardiac catheterization showed a 95% stenosis in the proximal LAD bifurcating with a 90% stenosis in first diagonal. Ejection fraction was 45% with  anterior wall hypokinesis. She underwent balloon angioplasty to ostial first diagonal and drug-eluting stent placement to proximal LAD with a 3.0 x 15 mm Xience EX drug-eluting stent  . Other and unspecified hyperlipidemia   . Heart murmur   . Heart disease   . History of blood transfusion     Past Surgical History  Procedure Laterality Date  . Bladder sling    . Cardiac catheterization  02/2012    [facility]  . Coronary angioplasty  02/2012    left/right s/p balloon  . Cholecystectomy  1982  . Transobturator sling N/A 2009    [place]  . Heart stent'  2013  . Cervical polypectomy      There were no vitals filed for this visit.  Visit Diagnosis: Dysphagia - Plan: SLP modified barium swallow, SLP modified barium swallow                               G-Codes - 2014/11/29 1731    Functional Assessment Tool Used MBSS   Functional Limitations Swallowing   Swallow Current Status (W9675) At least 1 percent but less than 20 percent impaired, limited or restricted   Swallow Goal Status (F1638) At least 1 percent but less than 20 percent impaired, limited or restricted   Swallow Discharge Status [phone]) At least 1 percent but less than 20 percent impaired, limited or restricted  Problem List Patient Active Problem List   Diagnosis Date Noted  . Cervical polyp 10/01/2014  . Dysphagia, pharyngoesophageal phase 10/01/2014  . Prolapsed, uterovaginal, incomplete 06/24/2014  . Routine general medical examination at a health care facility 12/30/2012  . Obesity (BMI 30-39.9) 12/30/2012  . Hx of multiple pulmonary nodules 12/28/2012  . Plantar fasciitis of right foot 12/28/2012  . 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 Edema of both legs 09/12/2012  . Hip pain, bilateral 09/12/2012  . Hx of adenomatous colonic polyps 09/12/2012  . History of hodgkin's lymphoma 09/12/2012  . Coronary artery disease   . Hyperlipidemia   . Chest pain on exertion  02/29/2012    Watson,Katherine 11/28/2014, 5:32 PM  [facility] [place], [place], [address] Phone: [phone]   Fax:

## 2014-12-03 ENCOUNTER — Telehealth: Payer: Self-pay | Admitting: Internal Medicine

## 2014-12-06 ENCOUNTER — Other Ambulatory Visit: Payer: Self-pay | Admitting: Internal Medicine

## 2014-12-06 DIAGNOSIS — Z1211 Encounter for screening for malignant neoplasm of colon: Secondary | ICD-10-CM

## 2014-12-06 DIAGNOSIS — Z86010 Personal history of colonic polyps: Secondary | ICD-10-CM

## 2014-12-06 DIAGNOSIS — Z87898 Personal history of other specified conditions: Secondary | ICD-10-CM

## 2014-12-10 ENCOUNTER — Other Ambulatory Visit: Payer: Self-pay

## 2014-12-10 NOTE — Telephone Encounter (Signed)
Gastroenterology Pre-Procedure Review  Request Date: 02-05-2015 Requesting Physician: Dr. Allen Norris  PATIENT REVIEW QUESTIONS: The patient responded to the following health history questions as indicated:    1. Are you having any GI issues? no 2. Do you have a personal history of Polyps? yes (6years ago) 3. Do you have a family history of Colon Cancer or Polyps? no 4. Diabetes Mellitus? no 5. Joint replacements in the past 12 months?no 6. Major health problems in the past 3 months?no 7. Any artificial heart valves, MVP, or defibrillator?no    MEDICATIONS & ALLERGIES:    Patient reports the following regarding taking any anticoagulation/antiplatelet therapy:   Plavix, Coumadin, Eliquis, Xarelto, Lovenox, Pradaxa, Brilinta, or Effient? no Aspirin? yes (Low dose daily, pt had stent placed 2013)  Patient confirms/reports the following medications:  Current Outpatient Prescriptions  Medication Sig Dispense Refill   aspirin 81 MG tablet Take 81 mg by mouth daily.     atorvastatin (LIPITOR) 10 MG tablet TAKE ONE TABLET BY MOUTH ONCE DAILY 90 tablet 3   BIOTIN PO Take by mouth daily.     Cholecalciferol (VITAMIN D-3) 1000 UNITS CAPS Take 1 capsule by mouth daily.     metoprolol tartrate (LOPRESSOR) 25 MG tablet TAKE ONE TABLET BY MOUTH TWICE DAILY 180 tablet 3   nitroGLYCERIN (NITROSTAT) 0.4 MG SL tablet Place 1 tablet (0.4 mg total) under the tongue every 5 (five) minutes as needed for chest pain. 25 tablet 3   pyridOXINE (VITAMIN B-6) 100 MG tablet Take 100 mg by mouth daily.     No current facility-administered medications for this visit.    Patient confirms/reports the following allergies:  Allergies  Allergen Reactions   Antifungal [Miconazole Nitrate]    Sulfa Antibiotics    Z-Pak [Azithromycin]     No orders of the defined types were placed in this encounter.    AUTHORIZATION INFORMATION Primary Insurance: 1D#: Group #:  Secondary Insurance: 1D#: Group  #:  SCHEDULE INFORMATION: Date: 02-05-2015 Time: Location:[facility]

## 2014-12-14 ENCOUNTER — Ambulatory Visit
Admission: RE | Admit: 2014-12-14 | Discharge: 2014-12-14 | Disposition: A | Discharge status: Home / Self Care | Payer: PPO | Source: Ambulatory Visit | Attending: Internal Medicine | Admitting: Internal Medicine

## 2014-12-14 DIAGNOSIS — Z1239 Encounter for other screening for malignant neoplasm of breast: Secondary | ICD-10-CM

## 2014-12-14 DIAGNOSIS — Z1231 Encounter for screening mammogram for malignant neoplasm of breast: Secondary | ICD-10-CM | POA: Diagnosis present

## 2015-02-05 ENCOUNTER — Ambulatory Visit: Payer: BLUE CROSS/BLUE SHIELD | Admitting: Anesthesiology

## 2015-02-05 ENCOUNTER — Encounter
Admission: RE | Disposition: A | Discharge status: Home / Self Care | Payer: Self-pay | Source: Ambulatory Visit | Attending: Gastroenterology

## 2015-02-05 ENCOUNTER — Encounter: Payer: Self-pay | Admitting: *Deleted

## 2015-02-05 ENCOUNTER — Ambulatory Visit
Admission: RE | Admit: 2015-02-05 | Discharge: 2015-02-05 | Disposition: A | Discharge status: Home / Self Care | Payer: BLUE CROSS/BLUE SHIELD | Source: Ambulatory Visit | Attending: Gastroenterology | Admitting: Gastroenterology

## 2015-02-05 DIAGNOSIS — D649 Anemia, unspecified: Secondary | ICD-10-CM | POA: Insufficient documentation

## 2015-02-05 DIAGNOSIS — Z9221 Personal history of antineoplastic chemotherapy: Secondary | ICD-10-CM | POA: Diagnosis not present

## 2015-02-05 DIAGNOSIS — Z8571 Personal history of Hodgkin lymphoma: Secondary | ICD-10-CM | POA: Diagnosis not present

## 2015-02-05 DIAGNOSIS — Z86010 Personal history of colonic polyps: Secondary | ICD-10-CM | POA: Diagnosis not present

## 2015-02-05 DIAGNOSIS — Z87891 Personal history of nicotine dependence: Secondary | ICD-10-CM | POA: Insufficient documentation

## 2015-02-05 DIAGNOSIS — K5730 Diverticulosis of large intestine without perforation or abscess without bleeding: Secondary | ICD-10-CM | POA: Insufficient documentation

## 2015-02-05 DIAGNOSIS — Z6835 Body mass index (BMI) 35.0-35.9, adult: Secondary | ICD-10-CM | POA: Insufficient documentation

## 2015-02-05 DIAGNOSIS — Z1211 Encounter for screening for malignant neoplasm of colon: Secondary | ICD-10-CM | POA: Insufficient documentation

## 2015-02-05 DIAGNOSIS — E785 Hyperlipidemia, unspecified: Secondary | ICD-10-CM | POA: Insufficient documentation

## 2015-02-05 DIAGNOSIS — E669 Obesity, unspecified: Secondary | ICD-10-CM | POA: Diagnosis not present

## 2015-02-05 DIAGNOSIS — D125 Benign neoplasm of sigmoid colon: Secondary | ICD-10-CM | POA: Insufficient documentation

## 2015-02-05 DIAGNOSIS — I25119 Atherosclerotic heart disease of native coronary artery with unspecified angina pectoris: Secondary | ICD-10-CM | POA: Diagnosis not present

## 2015-02-05 DIAGNOSIS — Z923 Personal history of irradiation: Secondary | ICD-10-CM | POA: Diagnosis not present

## 2015-02-05 DIAGNOSIS — I10 Essential (primary) hypertension: Secondary | ICD-10-CM | POA: Insufficient documentation

## 2015-02-05 HISTORY — PX: COLONOSCOPY WITH PROPOFOL: SHX5780

## 2015-02-05 SURGERY — COLONOSCOPY WITH PROPOFOL
Anesthesia: General

## 2015-02-05 MED ORDER — FENTANYL CITRATE (PF) 100 MCG/2ML IJ SOLN
INTRAMUSCULAR | Status: DC | PRN
  Administered 2015-02-05: 50 ug via INTRAVENOUS

## 2015-02-05 MED ORDER — METOPROLOL TARTRATE 25 MG PO TABS
25.0000 mg | ORAL_TABLET | Freq: Once | ORAL | Status: AC
  Administered 2015-02-05: 25 mg via ORAL

## 2015-02-05 MED ORDER — SODIUM CHLORIDE 0.9 % IV SOLN
INTRAVENOUS | Status: DC
  Administered 2015-02-05: 1000 mL via INTRAVENOUS

## 2015-02-05 MED ORDER — MIDAZOLAM HCL 2 MG/2ML IJ SOLN
INTRAMUSCULAR | Status: DC | PRN
  Administered 2015-02-05: 1 mg via INTRAVENOUS

## 2015-02-05 MED ORDER — PROPOFOL INFUSION 10 MG/ML OPTIME
INTRAVENOUS | Status: DC | PRN
  Administered 2015-02-05: 120 ug/kg/min via INTRAVENOUS

## 2015-02-05 MED ORDER — METOPROLOL TARTRATE 25 MG PO TABS
ORAL_TABLET | ORAL | Status: AC
  Filled 2015-02-05: qty 1

## 2015-02-05 MED ORDER — METOPROLOL TARTRATE 25 MG PO TABS
ORAL_TABLET | ORAL | Status: AC
  Administered 2015-02-05: 25 mg via ORAL
  Filled 2015-02-05: qty 1

## 2015-02-05 NOTE — Anesthesia Postprocedure Evaluation (Signed)
  Anesthesia Post-op Note  Patient: Jaime Hall  Procedure(s) Performed: Procedure(s): COLONOSCOPY WITH PROPOFOL (N/A)  Anesthesia type:General  Patient location: PACU  Post pain: Pain level controlled  Post assessment: Post-op Vital signs reviewed, Patient's Cardiovascular Status Stable, Respiratory Function Stable, Patent Airway and No signs of Nausea or vomiting  Post vital signs: Reviewed and stable  Last Vitals:  Filed Vitals:   02/05/15 1025  BP:   Pulse: 75  Temp:   Resp: 15    Level of consciousness: awake, alert  and patient cooperative  Complications: No apparent anesthesia complications

## 2015-02-05 NOTE — Transfer of Care (Signed)
Immediate Anesthesia Transfer of Care Note  Patient: AANIYA STERBA  Procedure(s) Performed: Procedure(s): COLONOSCOPY WITH PROPOFOL (N/A)  Patient Location: PACU  Anesthesia Type:General  Level of Consciousness: awake and sedated  Airway & Oxygen Therapy: Patient Spontanous Breathing and Patient connected to nasal cannula oxygen  Post-op Assessment: Report given to RN and Post -op Vital signs reviewed and stable  Post vital signs: Reviewed and stable  Last Vitals:  Filed Vitals:   02/05/15 0829  BP: 131/66  Pulse: 114  Temp: 35.9 C  Resp: 24    Complications: No apparent anesthesia complications

## 2015-02-05 NOTE — H&P (Signed)
[facility]  [address] [place], [place] [address] Phone: [phone] Fax : [phone]  Primary Care Physician:  [name], MD Primary Gastroenterologist:  Dr. Allen Norris  Pre-Procedure History & Physical: HPI:  Jaime Hall is a 64 y.o. female is here for an colonoscopy.   Past Medical History  Diagnosis Date  . Essential hypertension, benign   . Cervicalgia   . Osteoarthritis   . Fibrocystic breast disease   . Polycystic ovarian disease   . History of anemia   . Gestational hypertension   . Coronary artery disease 02/2012    Abnormal stress test with anterior wall ischemia. Cardiac catheterization showed a 95% stenosis in the proximal LAD bifurcating with a 90% stenosis in first diagonal. Ejection fraction was 45% with anterior wall hypokinesis. She underwent balloon angioplasty to ostial first diagonal and drug-eluting stent placement to proximal LAD with a 3.0 x 15 mm Xience EX drug-eluting stent  . Other and unspecified hyperlipidemia   . Heart murmur   . Heart disease   . History of blood transfusion   . Hodgkin's lymphoma     s/p radiation and chemo therapy    Past Surgical History  Procedure Laterality Date  . Bladder sling    . Cardiac catheterization  02/2012    [facility]  . Coronary angioplasty  02/2012    left/right s/p balloon  . Cholecystectomy  1982  . Transobturator sling N/A 2009    [place]  . Heart stent'  2013  . Cervical polypectomy      Prior to Admission medications   Medication Sig Start Date End Date Taking? Authorizing Provider  aspirin 81 MG tablet Take 81 mg by mouth daily.   Yes Historical Provider, MD  atorvastatin (LIPITOR) 10 MG tablet TAKE ONE TABLET BY MOUTH ONCE DAILY 03/27/14  Yes Wellington Hampshire, MD  BIOTIN PO Take by mouth daily.   Yes Historical Provider, MD  Cholecalciferol (VITAMIN D-3) 1000 UNITS CAPS Take 1 capsule by mouth daily.   Yes Historical Provider, MD  metoprolol tartrate (LOPRESSOR) 25  MG tablet TAKE ONE TABLET BY MOUTH TWICE DAILY 04/11/14  Yes Wellington Hampshire, MD  pyridOXINE (VITAMIN B-6) 100 MG tablet Take 100 mg by mouth daily.   Yes Historical Provider, MD  nitroGLYCERIN (NITROSTAT) 0.4 MG SL tablet Place 1 tablet (0.4 mg total) under the tongue every 5 (five) minutes as needed for chest pain. 02/22/13   Minna Merritts, MD    Allergies as of 12/10/2014 - Review Complete 10/29/2014  Allergen Reaction Noted  . Antifungal [miconazole nitrate]  02/29/2012  . Sulfa antibiotics  02/29/2012  . Z-pak [azithromycin]  02/29/2012    Family History  Problem Relation Age of Onset  . ALS Father   . Polymyositis Father   . Diabetes Brother   . Cancer Maternal Aunt     breast  . Breast cancer Maternal Aunt   . Stroke Maternal Grandmother   . Cancer Maternal Grandfather     prostate  . Stroke Maternal Grandfather     Social History   Social History  . Marital Status: Married    Spouse Name: N/A  . Number of Children: N/A  . Years of Education: N/A   Occupational History  . Not on file.   Social History Main Topics  . Smoking status: Former Smoker -- 1.00 packs/day for 30 years    Types: Cigarettes  . Smokeless tobacco: Never Used  . Alcohol Use: Yes  Comment: occasional  . Drug Use: No  . Sexual Activity: Not on file   Other Topics Concern  . Not on file   Social History Narrative    Review of Systems: See HPI, otherwise negative ROS  Physical Exam: BP 131/66 mmHg  Pulse 114  Temp(Src) 96.7 F (35.9 C) (Tympanic)  Resp 24  Ht 5' 1.5" (1.562 m)  Wt 190 lb (86.183 kg)  BMI 35.32 kg/m2  SpO2 100% General:   Alert,  pleasant and cooperative in NAD Head:  Normocephalic and atraumatic. Neck:  Supple; no masses or thyromegaly. Lungs:  Clear throughout to auscultation.    Heart:  Regular rate and rhythm. Abdomen:  Soft, nontender and nondistended. Normal bowel sounds, without guarding, and without rebound.   Neurologic:  Alert and  oriented  x4;  grossly normal neurologically.  Impression/Plan: Jaime Hall is here for an colonoscopy to be performed for history of colon polyps  Risks, benefits, limitations, and alternatives regarding  colonoscopy have been reviewed with the patient.  Questions have been answered.  All parties agreeable.   Ollen Bowl, MD  02/05/2015, 9:20 AM

## 2015-02-05 NOTE — Anesthesia Preprocedure Evaluation (Signed)
Anesthesia Evaluation  Patient identified by MRN, date of birth, ID band Patient awake    Reviewed: Allergy & Precautions, NPO status , Patient's Chart, lab work & pertinent test results  Airway Mallampati: III       Dental no notable dental hx.    Pulmonary COPDformer smoker,    + decreased breath sounds      Cardiovascular hypertension, Pt. on medications and Pt. on home beta blockers + angina with exertion + CAD Normal cardiovascular exam    Neuro/Psych    GI/Hepatic negative GI ROS, Neg liver ROS,   Endo/Other  negative endocrine ROS  Renal/GU negative Renal ROS     Musculoskeletal   Abdominal (+) + obese,   Peds  Hematology  (+) anemia ,   Anesthesia Other Findings   Reproductive/Obstetrics                             Anesthesia Physical Anesthesia Plan  ASA: III  Anesthesia Plan: General   Post-op Pain Management:    Induction: Intravenous  Airway Management Planned: Nasal Cannula  Additional Equipment:   Intra-op Plan:   Post-operative Plan:   Informed Consent: I have reviewed the patients History and Physical, chart, labs and discussed the procedure including the risks, benefits and alternatives for the proposed anesthesia with the patient or authorized representative who has indicated his/her understanding and acceptance.     Plan Discussed with: CRNA  Anesthesia Plan Comments:         Anesthesia Quick Evaluation

## 2015-02-05 NOTE — Anesthesia Procedure Notes (Signed)
Performed by: COOK-MARTIN, Devanta Daniel Pre-anesthesia Checklist: Patient identified, Emergency Drugs available, Suction available, Patient being monitored and Timeout performed Patient Re-evaluated:Patient Re-evaluated prior to inductionOxygen Delivery Method: Simple face mask Preoxygenation: Pre-oxygenation with 100% oxygen Intubation Type: IV induction Placement Confirmation: positive ETCO2 and CO2 detector       

## 2015-02-06 ENCOUNTER — Encounter: Payer: Self-pay | Admitting: Gastroenterology

## 2015-02-07 LAB — SURGICAL PATHOLOGY

## 2015-02-07 NOTE — Op Note (Signed)
[facility] Patient Name: Jaime Hall Procedure Date: 02/05/2015 9:14 AM MRN: [id number] Account #: [id number] Date of Birth: 1951-04-15 Admit Type: Outpatient Age: 64 Room: [facility] ENDO ROOM 4 Gender: Female Note Status: Finalized Procedure:         Colonoscopy Indications:       High risk colon cancer surveillance: Personal history of                     colonic polyps Providers:         Lucilla Lame, MD Referring MD:      Deborra Medina, MD (Referring MD) Medicines:         Propofol per Anesthesia Complications:     No immediate complications. Procedure:         Pre-Anesthesia Assessment:                    - Prior to the procedure, a History and Physical was                     performed, and patient medications and allergies were                     reviewed. The patient's tolerance of previous anesthesia                     was also reviewed. The risks and benefits of the procedure                     and the sedation options and risks were discussed with the                     patient. All questions were answered, and informed consent                     was obtained. Prior Anticoagulants: The patient has taken                     no previous anticoagulant or antiplatelet agents. ASA                     Grade Assessment: II - A patient with mild systemic                     disease. After reviewing the risks and benefits, the                     patient was deemed in satisfactory condition to undergo                     the procedure.                    After obtaining informed consent, the colonoscope was                     passed under direct vision. Throughout the procedure, the                     patient's blood pressure, pulse, and oxygen saturations                     were monitored continuously. The Olympus CF-Q160AL  colonoscope (S#. [id number]) was introduced through the anus                     and advanced to  the the cecum, identified by appendiceal                     orifice and ileocecal valve. The colonoscopy was performed                     without difficulty. The patient tolerated the procedure                     well. The quality of the bowel preparation was excellent. Findings:      The perianal and digital rectal examinations were normal.      Two sessile polyps were found in the sigmoid colon. The polyps were 3 to       4 mm in size. These polyps were removed with a cold biopsy forceps.       Resection and retrieval were complete.      Multiple small-mouthed diverticula were found in the sigmoid colon. Impression:        - Two 3 to 4 mm polyps in the sigmoid colon. Resected and                     retrieved.                    - Diverticulosis in the sigmoid colon. Recommendation:    - Repeat colonoscopy in 5 years for surveillance. Procedure Code(s): --- Professional ---                    [phone], Colonoscopy, flexible; with biopsy, single or                     multiple Diagnosis Code(s): --- Professional ---                    Z86.010, Personal history of colonic polyps                    D12.5, Benign neoplasm of sigmoid colon CPT copyright 2014 [employer]. All rights reserved. The codes documented in this report are preliminary and upon coder review may  be revised to meet current compliance requirements. Lucilla Lame, MD 02/05/2015 9:43:14 AM This report has been signed electronically. Number of Addenda: 0 Note Initiated On: 02/05/2015 9:14 AM Scope Withdrawal Time: 0 hours 8 minutes 22 seconds  Total Procedure Duration: 0 hours 13 minutes 1 second       [facility]

## 2015-02-11 ENCOUNTER — Encounter: Payer: Self-pay | Admitting: Gastroenterology

## 2015-03-26 ENCOUNTER — Other Ambulatory Visit: Payer: PPO

## 2015-03-26 ENCOUNTER — Ambulatory Visit: Payer: PPO | Admitting: Internal Medicine

## 2015-03-29 ENCOUNTER — Other Ambulatory Visit: Payer: Self-pay | Admitting: Cardiovascular Disease

## 2015-04-10 ENCOUNTER — Other Ambulatory Visit: Payer: Self-pay | Admitting: *Deleted

## 2015-04-10 DIAGNOSIS — Z8571 Personal history of Hodgkin lymphoma: Secondary | ICD-10-CM

## 2015-04-11 ENCOUNTER — Inpatient Hospital Stay: Payer: BLUE CROSS/BLUE SHIELD | Attending: Oncology

## 2015-04-11 ENCOUNTER — Inpatient Hospital Stay (HOSPITAL_BASED_OUTPATIENT_CLINIC_OR_DEPARTMENT_OTHER): Payer: BLUE CROSS/BLUE SHIELD | Admitting: Oncology

## 2015-04-11 ENCOUNTER — Ambulatory Visit: Payer: PPO | Admitting: Oncology

## 2015-04-11 ENCOUNTER — Other Ambulatory Visit: Payer: PPO

## 2015-04-11 VITALS — BP 130/78 | HR 88 | Temp 97.4°F | Resp 18 | Wt 192.5 lb

## 2015-04-11 DIAGNOSIS — I10 Essential (primary) hypertension: Secondary | ICD-10-CM

## 2015-04-11 DIAGNOSIS — I510 Cardiac septal defect, acquired: Secondary | ICD-10-CM

## 2015-04-11 DIAGNOSIS — I2510 Atherosclerotic heart disease of native coronary artery without angina pectoris: Secondary | ICD-10-CM | POA: Insufficient documentation

## 2015-04-11 DIAGNOSIS — E785 Hyperlipidemia, unspecified: Secondary | ICD-10-CM

## 2015-04-11 DIAGNOSIS — Z862 Personal history of diseases of the blood and blood-forming organs and certain disorders involving the immune mechanism: Secondary | ICD-10-CM | POA: Diagnosis not present

## 2015-04-11 DIAGNOSIS — Z87891 Personal history of nicotine dependence: Secondary | ICD-10-CM | POA: Diagnosis not present

## 2015-04-11 DIAGNOSIS — N6019 Diffuse cystic mastopathy of unspecified breast: Secondary | ICD-10-CM | POA: Diagnosis not present

## 2015-04-11 DIAGNOSIS — Z923 Personal history of irradiation: Secondary | ICD-10-CM | POA: Insufficient documentation

## 2015-04-11 DIAGNOSIS — Z8571 Personal history of Hodgkin lymphoma: Secondary | ICD-10-CM | POA: Diagnosis present

## 2015-04-11 DIAGNOSIS — Z79899 Other long term (current) drug therapy: Secondary | ICD-10-CM | POA: Diagnosis not present

## 2015-04-11 DIAGNOSIS — E282 Polycystic ovarian syndrome: Secondary | ICD-10-CM | POA: Insufficient documentation

## 2015-04-11 DIAGNOSIS — M1990 Unspecified osteoarthritis, unspecified site: Secondary | ICD-10-CM

## 2015-04-11 DIAGNOSIS — C8190 Hodgkin lymphoma, unspecified, unspecified site: Secondary | ICD-10-CM

## 2015-04-11 DIAGNOSIS — Z7982 Long term (current) use of aspirin: Secondary | ICD-10-CM

## 2015-04-11 DIAGNOSIS — M542 Cervicalgia: Secondary | ICD-10-CM | POA: Diagnosis not present

## 2015-04-11 LAB — CREATININE, SERUM
Creatinine, Ser: 1 mg/dL (ref 0.44–1.00)
GFR calc Af Amer: >60 mL/min (ref >60)
GFR calc non Af Amer: 58 mL/min — ABNORMAL LOW (ref >60)

## 2015-04-11 LAB — HEPATIC FUNCTION PANEL
ALT: 27 U/L (ref 14–54)
AST: 27 U/L (ref 15–41)
Albumin: 4.1 g/dL (ref 3.5–5.0)
Alkaline Phosphatase: 69 U/L (ref 38–126)
Bilirubin, Direct: 0.1 mg/dL (ref 0.1–0.5)
Indirect Bilirubin: 1.1 mg/dL — ABNORMAL HIGH (ref 0.3–0.9)
Total Bilirubin: 1.2 mg/dL (ref 0.3–1.2)
Total Protein: 6.7 g/dL (ref 6.5–8.1)

## 2015-04-11 LAB — CBC WITH DIFFERENTIAL/PLATELET
Basophils Absolute: 0.1 10*3/uL (ref 0–0.1)
Basophils Relative: 1 %
Eosinophils Absolute: 0.1 10*3/uL (ref 0–0.7)
Eosinophils Relative: 2 %
HCT: 41.5 % (ref 35.0–47.0)
Hemoglobin: 13.8 g/dL (ref 12.0–16.0)
Lymphocytes Relative: 16 %
Lymphs Abs: 1.3 10*3/uL (ref 1.0–3.6)
MCH: 29.7 pg (ref 26.0–34.0)
MCHC: 33.3 g/dL (ref 32.0–36.0)
MCV: 89.1 fL (ref 80.0–100.0)
Monocytes Absolute: 0.5 10*3/uL (ref 0.2–0.9)
Monocytes Relative: 6 %
Neutro Abs: 6.1 10*3/uL (ref 1.4–6.5)
Neutrophils Relative %: 75 %
Platelets: 228 10*3/uL (ref 150–440)
RBC: 4.65 MIL/uL (ref 3.80–5.20)
RDW: 13.7 % (ref 11.5–14.5)
WBC: 8.1 10*3/uL (ref 3.6–11.0)

## 2015-04-11 LAB — LACTATE DEHYDROGENASE: LDH: 167 U/L (ref 98–192)

## 2015-04-11 NOTE — Progress Notes (Signed)
This is a Dr. Ma Hillock for f/u with Dr.Finnegan and offers no problems or concerns today.

## 2015-04-21 NOTE — Progress Notes (Signed)
[place]  [phone] [phone]  ID: Jaime Hall OB: 08/08/1950  MR#: [id number]  OKH#:[id number]  Patient Care Team: [name], MD as PCP - General (Internal Medicine) [name], MD as Consulting Physician (General Surgery)  CHIEF COMPLAINT:  Chief Complaint  Patient presents with  . Lymphoma   Stage II bulky Hodgkin's lymphoma (has bulky PET positive mediastinal lymphadenopathy  -  mediastinotomy and lymph node biopsy January 2011 showed classic Hodgkin's lymphoma, mixed cellularity type). Bone marrow biopsy negative for Hodgkins lymphoma, cytogenetics normal (46,XX). Completed 4 cycles of ABVD chemo on 11/11/09.  Then involved field radiation completed July 2011.  INTERVAL HISTORY:  Patient returns to clinic today for repeat laboratory work and further evaluation. She continues to feel well and remains asymptomatic. She denies any fevers, night sweats, or weight loss. She has no neurologic complaints. She denies any chest pain or shortness of breath. She denies any nausea, vomiting, constipation, or diarrhea. She has no urinary complaints. Patient feels at her baseline and offers no specific complaints today.  REVIEW OF SYSTEMS:   Review of Systems  Constitutional: Negative for fever, weight loss and malaise/fatigue.  Respiratory: Negative.  Negative for cough.   Cardiovascular: Negative.  Negative for chest pain.  Gastrointestinal: Negative.   Musculoskeletal: Negative.   Neurological: Negative.  Negative for weakness.  Endo/Heme/Allergies: Does not bruise/bleed easily.    As per HPI. Otherwise, a complete review of systems is negatve.  PAST MEDICAL HISTORY: Past Medical History  Diagnosis Date  . Essential hypertension, benign   . Cervicalgia   . Osteoarthritis   . Fibrocystic breast disease   . Polycystic ovarian disease   . History of anemia   . Gestational hypertension   . Coronary artery disease  02/2012    Abnormal stress test with anterior wall ischemia. Cardiac catheterization showed a 95% stenosis in the proximal LAD bifurcating with a 90% stenosis in first diagonal. Ejection fraction was 45% with anterior wall hypokinesis. She underwent balloon angioplasty to ostial first diagonal and drug-eluting stent placement to proximal LAD with a 3.0 x 15 mm Xience EX drug-eluting stent  . Other and unspecified hyperlipidemia   . Heart murmur   . Heart disease   . History of blood transfusion   . Hodgkin's lymphoma     s/p radiation and chemo therapy    PAST SURGICAL HISTORY: Past Surgical History  Procedure Laterality Date  . Bladder sling    . Cardiac catheterization  02/2012    [facility]  . Coronary angioplasty  02/2012    left/right s/p balloon  . Cholecystectomy  1982  . Transobturator sling N/A 2009    [place]  . Heart stent'  2013  . Cervical polypectomy    . Colonoscopy with propofol N/A 02/05/2015    Procedure: COLONOSCOPY WITH PROPOFOL;  Surgeon: Lucilla Lame, MD;  Location: [facility] ENDOSCOPY;  Service: Endoscopy;  Laterality: N/A;    FAMILY HISTORY Family History  Problem Relation Age of Onset  . ALS Father   . Polymyositis Father   . Diabetes Brother   . Cancer Maternal Aunt     breast  . Breast cancer Maternal Aunt   . Stroke Maternal Grandmother   . Cancer Maternal Grandfather     prostate  . Stroke Maternal Grandfather        ADVANCED DIRECTIVES:    HEALTH MAINTENANCE: Social History  Substance Use Topics  . Smoking status: Former Smoker -- 1.00 packs/day for 30 years  Types: Cigarettes  . Smokeless tobacco: Never Used  . Alcohol Use: Yes     Comment: occasional     Colonoscopy:  PAP:  Bone density:  Lipid panel:  Allergies  Allergen Reactions  . Antifungal [Miconazole Nitrate]   . Sulfa Antibiotics   . Z-Pak [Azithromycin]     Current Outpatient Prescriptions  Medication Sig Dispense Refill  . aspirin 81 MG tablet Take 81 mg by mouth  daily.    Marland Kitchen atorvastatin (LIPITOR) 10 MG tablet TAKE ONE TABLET BY MOUTH ONCE DAILY 90 tablet 3  . BIOTIN PO Take by mouth daily.    . Cholecalciferol (VITAMIN D-3) 1000 UNITS CAPS Take 1 capsule by mouth daily.    . metoprolol tartrate (LOPRESSOR) 25 MG tablet TAKE ONE TABLET BY MOUTH TWICE DAILY 180 tablet 3  . nitroGLYCERIN (NITROSTAT) 0.4 MG SL tablet Place 1 tablet (0.4 mg total) under the tongue every 5 (five) minutes as needed for chest pain. 25 tablet 3  . pyridOXINE (VITAMIN B-6) 100 MG tablet Take 100 mg by mouth daily.    . TURMERIC PO Take by mouth.     No current facility-administered medications for this visit.    OBJECTIVE: Filed Vitals:   04/11/15 1527  BP: 130/78  Pulse: 88  Temp: 97.4 F (36.3 C)  Resp: 18     Body mass index is 35.78 kg/(m^2).    ECOG FS:0 - Asymptomatic  General: Well-developed, well-nourished, no acute distress. Eyes: Pink conjunctiva, anicteric sclera. Lungs: Clear to auscultation bilaterally. Heart: Regular rate and rhythm. No rubs, murmurs, or gallops. Abdomen: Soft, nontender, nondistended. No organomegaly noted, normoactive bowel sounds. Musculoskeletal: No edema, cyanosis, or clubbing. Neuro: Alert, answering all questions appropriately. Cranial nerves grossly intact. Skin: No rashes or petechiae noted. Psych: Normal affect. Lymphatics: No cervical, calvicular, axillary or inguinal LAD.   LAB RESULTS:  Lab Results  Component Value Date   NA 139 10/24/2014   K 5.1 10/24/2014   CL 103 10/24/2014   CO2 31 10/24/2014   GLUCOSE 102* 10/24/2014   BUN 17 10/24/2014   CREATININE 1.00 04/11/2015   CALCIUM 9.7 10/24/2014   PROT 6.7 04/11/2015   ALBUMIN 4.1 04/11/2015   AST 27 04/11/2015   ALT 27 04/11/2015   ALKPHOS 69 04/11/2015   BILITOT 1.2 04/11/2015   GFRNONAA 58* 04/11/2015   GFRAA >60 04/11/2015    Lab Results  Component Value Date   WBC 8.1 04/11/2015   NEUTROABS 6.1 04/11/2015   HGB 13.8 04/11/2015   HCT 41.5  04/11/2015   MCV 89.1 04/11/2015   PLT 228 04/11/2015     STUDIES: No results found.  ASSESSMENT:  Stage II Hodgkin's lymphoma.  PLAN:     1. Hodgkin's lymphoma: No evidence of disease.  Patient is now greater than 5 years removed from completing her chemotherapy and her XRT. All of her laboratory work is within normal limits. Her most recent imaging did not reveal evidence of disease. No follow-up is necessary. Please continue to monitor the patient and refer her back if there is any concerns or suspicions.  Patient expressed understanding and was in agreement with this plan. She also understands that She can call clinic at any time with any questions, concerns, or complaints.    Lloyd Huger, MD   04/21/2015 8:33 AM

## 2015-04-29 ENCOUNTER — Ambulatory Visit: Payer: PPO | Admitting: Internal Medicine

## 2015-04-29 ENCOUNTER — Encounter: Payer: Self-pay | Admitting: Internal Medicine

## 2015-04-29 VITALS — BP 115/62 | HR 92 | Temp 98.1°F | Resp 16 | Wt 193.0 lb

## 2015-04-29 DIAGNOSIS — H6123 Impacted cerumen, bilateral: Secondary | ICD-10-CM

## 2015-04-29 DIAGNOSIS — M8580 Other specified disorders of bone density and structure, unspecified site: Secondary | ICD-10-CM

## 2015-04-29 DIAGNOSIS — E785 Hyperlipidemia, unspecified: Secondary | ICD-10-CM

## 2015-04-29 DIAGNOSIS — E669 Obesity, unspecified: Secondary | ICD-10-CM

## 2015-04-29 DIAGNOSIS — Z23 Encounter for immunization: Secondary | ICD-10-CM

## 2015-04-29 DIAGNOSIS — Z79899 Other long term (current) drug therapy: Secondary | ICD-10-CM

## 2015-04-29 DIAGNOSIS — Z86010 Personal history of colonic polyps: Secondary | ICD-10-CM

## 2015-04-29 LAB — COMPREHENSIVE METABOLIC PANEL
ALT: 16 U/L (ref 0–35)
AST: 19 U/L (ref 0–37)
Albumin: 4.5 g/dL (ref 3.5–5.2)
Alkaline Phosphatase: 71 U/L (ref 39–117)
BUN: 22 mg/dL (ref 6–23)
CO2: 26 mEq/L (ref 19–32)
Calcium: 9.8 mg/dL (ref 8.4–10.5)
Chloride: 105 mEq/L (ref 96–112)
Creatinine, Ser: 1.13 mg/dL (ref 0.40–1.20)
GFR: 51.27 mL/min — ABNORMAL LOW (ref >60.00)
Glucose, Bld: 81 mg/dL (ref 70–99)
Potassium: 4.7 mEq/L (ref 3.5–5.1)
Sodium: 141 mEq/L (ref 135–145)
Total Bilirubin: 1.1 mg/dL (ref 0.2–1.2)
Total Protein: 6.8 g/dL (ref 6.0–8.3)

## 2015-04-29 MED ORDER — DOCUSATE SODIUM 50 MG/5ML PO LIQD: ORAL | Status: DC

## 2015-04-29 NOTE — Patient Instructions (Addendum)
You can use liquid colace to soften your ear wax.  If this doesn't work after a few days,  You can make an appointment for an ear cleaning   We will order your DEXA scan after 2 pm   Check with yoru insurance about the shingles vaccine (Zostavax)    Prevnar given today   You can try 25 benadryl (dipenhydramine ) at bedtime for drippy nose

## 2015-04-29 NOTE — Progress Notes (Signed)
Subjective:  Patient ID: Jaime Hall, female    DOB: Sep 04, 1950  Age: 64 y.o. MRN: [id number]  CC: The primary encounter diagnosis was Long-term use of high-risk medication. Diagnoses of Cerumen impaction, bilateral, Osteopenia due to cancer therapy, Need for prophylactic vaccination against Streptococcus pneumoniae (pneumococcus), Hyperlipidemia, Hx of adenomatous colonic polyps, and Obesity (BMI 30-39.9) were also pertinent to this visit.  HPI ISABELLAH FERREBEE presents for follow up on obesity, hyperlipidemia , and history of lymphoma treated in 2011 with no recurrence.  She has been seen by Oncology  And released from follow up unless she develops new concerns.   Colonoscopy: polyps removed sept 2016 Mammogram July 2016 normal  DEXA 2013, needs repeating    Outpatient Prescriptions Prior to Visit  Medication Sig Dispense Refill  . aspirin 81 MG tablet Take 81 mg by mouth daily.    Marland Kitchen atorvastatin (LIPITOR) 10 MG tablet TAKE ONE TABLET BY MOUTH ONCE DAILY 90 tablet 3  . BIOTIN PO Take by mouth daily.    . Cholecalciferol (VITAMIN D-3) 1000 UNITS CAPS Take 1 capsule by mouth daily.    . metoprolol tartrate (LOPRESSOR) 25 MG tablet TAKE ONE TABLET BY MOUTH TWICE DAILY 180 tablet 3  . nitroGLYCERIN (NITROSTAT) 0.4 MG SL tablet Place 1 tablet (0.4 mg total) under the tongue every 5 (five) minutes as needed for chest pain. 25 tablet 3  . pyridOXINE (VITAMIN B-6) 100 MG tablet Take 100 mg by mouth daily.    . TURMERIC PO Take by mouth.     No facility-administered medications prior to visit.    Review of Systems;  Patient denies headache, fevers, malaise, unintentional weight loss, skin rash, eye pain, sinus congestion and sinus pain, sore throat, dysphagia,  hemoptysis , cough, dyspnea, wheezing, chest pain, palpitations, orthopnea, edema, abdominal pain, nausea, melena, diarrhea, constipation, flank pain, dysuria, hematuria, urinary  Frequency, nocturia, numbness, tingling,  seizures,  Focal weakness, Loss of consciousness,  Tremor, insomnia, depression, anxiety, and suicidal ideation.      Objective:  BP 115/62 mmHg  Pulse 92  Temp(Src) 98.1 F (36.7 C)  Resp 16  Wt 193 lb (87.544 kg)  SpO2 100%  BP Readings from Last 3 Encounters:  04/29/15 115/62  04/11/15 130/78  02/05/15 133/64    Wt Readings from Last 3 Encounters:  04/29/15 193 lb (87.544 kg)  04/11/15 192 lb 7.4 oz (87.3 kg)  02/05/15 190 lb (86.183 kg)    General appearance: alert, cooperative and appears stated age Ears: normal TM's and external ear canals both ears Throat: lips, mucosa, and tongue normal; teeth and gums normal Neck: no adenopathy, no carotid bruit, supple, symmetrical, trachea midline and thyroid not enlarged, symmetric, no tenderness/mass/nodules Back: symmetric, no curvature. ROM normal. No CVA tenderness. Lungs: clear to auscultation bilaterally Heart: regular rate and rhythm, S1, S2 normal, no murmur, click, rub or gallop Abdomen: soft, non-tender; bowel sounds normal; no masses,  no organomegaly Pulses: 2+ and symmetric Skin: Skin color, texture, turgor normal. No rashes or lesions Lymph nodes: Cervical, supraclavicular, and axillary nodes normal.  No results found for: HGBA1C  Lab Results  Component Value Date   CREATININE 1.13 04/29/2015   CREATININE 1.00 04/11/2015   CREATININE 0.90 10/24/2014    Lab Results  Component Value Date   WBC 8.1 04/11/2015   HGB 13.8 04/11/2015   HCT 41.5 04/11/2015   PLT 228 04/11/2015   GLUCOSE 81 04/29/2015   CHOL 114 10/24/2014   TRIG 95.0 10/24/2014  HDL 54.60 10/24/2014   LDLCALC 40 10/24/2014   ALT 16 04/29/2015   AST 19 04/29/2015   NA 141 04/29/2015   K 4.7 04/29/2015   CL 105 04/29/2015   CREATININE 1.13 04/29/2015   BUN 22 04/29/2015   CO2 26 04/29/2015   TSH 3.70 10/24/2014   INR 1.0 02/29/2012   MICROALBUR 0.8 09/23/2012    Mm Digital Screening Bilateral  12/14/2014  CLINICAL DATA:   Screening. EXAM: DIGITAL SCREENING BILATERAL MAMMOGRAM WITH CAD COMPARISON:  Previous exam(s). ACR Breast Density Category c: The breast tissue is heterogeneously dense, which may obscure small masses. FINDINGS: There are no findings suspicious for malignancy. Images were processed with CAD. IMPRESSION: No mammographic evidence of malignancy. A result letter of this screening mammogram will be mailed directly to the patient. RECOMMENDATION: Screening mammogram in one year. (Code:SM-B-01Y) BI-RADS CATEGORY  1: Negative. Electronically Signed   By: Everlean Alstrom M.D.   On: 12/14/2014 13:03    Assessment & Plan:   Problem List Items Addressed This Visit    Hyperlipidemia    Managed with  High potency statin ,  Low dose.   LDL and triglycerides are at goal on current medications. He has no side effects and liver enzymes are normal. No changes today  Lab Results  Component Value Date   CHOL 114 10/24/2014   HDL 54.60 10/24/2014   LDLCALC 40 10/24/2014   TRIG 95.0 10/24/2014   CHOLHDL 2 10/24/2014   Lab Results  Component Value Date   ALT 16 04/29/2015   AST 19 04/29/2015   ALKPHOS 71 04/29/2015   BILITOT 1.1 04/29/2015             Hx of adenomatous colonic polyps    By colonoscopy Sept 2016.        Obesity (BMI 30-39.9)    I have addressed  BMI and recommended wt loss of 10% of body weight over the next 6 months using a low glycemic index diet and regular exercise a minimum of 5 days per week.        Cerumen impaction     She was advised to use liquid colace or Debrox and return for irrigation.        Other Visit Diagnoses    Long-term use of high-risk medication    -  Primary    Relevant Orders    Comprehensive metabolic panel (Completed)    Osteopenia due to cancer therapy        Relevant Orders    DG Bone Density    Need for prophylactic vaccination against Streptococcus pneumoniae (pneumococcus)        Relevant Orders    Pneumococcal conjugate vaccine 13-valent      A total of 25 minutes of face to face time was spent with patient more than half of which was spent in counselling about the above mentioned conditions  and coordination of care   I am having Ms. Metzger start on docusate. I am also having her maintain her aspirin, pyridOXINE, BIOTIN PO, Vitamin D-3, nitroGLYCERIN, atorvastatin, metoprolol tartrate, and TURMERIC PO.  Meds ordered this encounter  Medications  . docusate (COLACE) 50 MG/5ML liquid    Sig: 2-3 drops in each ear nightly    Dispense:  100 mL    Refill:  0    There are no discontinued medications.  Follow-up: Return in about 6 months (around 10/27/2015).   [name], MD

## 2015-04-29 NOTE — Progress Notes (Signed)
Pre visit review using our clinic review tool, if applicable. No additional management support is needed unless otherwise documented below in the visit note. 

## 2015-04-30 NOTE — Assessment & Plan Note (Signed)
By colonoscopy Sept 2016.

## 2015-04-30 NOTE — Assessment & Plan Note (Signed)
Managed with  High potency statin ,  Low dose.   LDL and triglycerides are at goal on current medications. He has no side effects and liver enzymes are normal. No changes today  Lab Results  Component Value Date   CHOL 114 10/24/2014   HDL 54.60 10/24/2014   LDLCALC 40 10/24/2014   TRIG 95.0 10/24/2014   CHOLHDL 2 10/24/2014   Lab Results  Component Value Date   ALT 16 04/29/2015   AST 19 04/29/2015   ALKPHOS 71 04/29/2015   BILITOT 1.1 04/29/2015

## 2015-04-30 NOTE — Assessment & Plan Note (Signed)
I have addressed  BMI and recommended wt loss of 10% of body weight over the next 6 months using a low glycemic index diet and regular exercise a minimum of 5 days per week.   

## 2015-04-30 NOTE — Assessment & Plan Note (Signed)
She was advised to use liquid colace or Debrox and return for irrigation.

## 2015-05-02 ENCOUNTER — Encounter: Payer: Self-pay | Admitting: Internal Medicine

## 2015-05-03 ENCOUNTER — Ambulatory Visit (INDEPENDENT_AMBULATORY_CARE_PROVIDER_SITE_OTHER): Payer: BLUE CROSS/BLUE SHIELD | Admitting: Cardiovascular Disease

## 2015-05-03 ENCOUNTER — Encounter: Payer: Self-pay | Admitting: Cardiovascular Disease

## 2015-05-03 VITALS — BP 118/80 | HR 89 | Ht 61.0 in | Wt 192.0 lb

## 2015-05-03 DIAGNOSIS — E785 Hyperlipidemia, unspecified: Secondary | ICD-10-CM | POA: Diagnosis not present

## 2015-05-03 DIAGNOSIS — I25118 Atherosclerotic heart disease of native coronary artery with other forms of angina pectoris: Secondary | ICD-10-CM | POA: Diagnosis not present

## 2015-05-03 NOTE — Patient Instructions (Signed)
Medication Instructions: Continue same medications.   Labwork: None.   Procedures/Testing: None.   Follow-Up: 1 year with Dr. Arida  Any Additional Special Instructions Will Be Listed Below (If Applicable).   

## 2015-05-03 NOTE — Assessment & Plan Note (Signed)
Lab Results  Component Value Date   CHOL 114 10/24/2014   HDL 54.60 10/24/2014   LDLCALC 40 10/24/2014   TRIG 95.0 10/24/2014   CHOLHDL 2 10/24/2014   Lipid profile was optimal on current dose of atorvastatin.

## 2015-05-03 NOTE — Progress Notes (Signed)
HPI  This is a 64 year old female who is here today for a followup visit regarding coronary artery disease.  She has history of Hodgkin's lymphoma treated with radiation and chemotherapy to the chest area and has been in remission since 2011. She does have pulmonary nodules which are being followed regularly every 6 months. She is a former smoker. Stress echocardiogram in 02/2012 showed evidence of severe anterior wall ischemia. She underwent cardiac catheterization  which showed a 95% stenosis in the proximal LAD as well as 90% ostial first diagonal stenosis. This was a bifurcation stenosis. She underwent a complex PCI with balloon angioplasty of first diagonal  as well as a drug-eluting stent placement to the proximal LAD. She had recurrent intermittent chest pain after that. A treadmill nuclear stress test in September of 2014 showed a fixed anterior wall defect with minor reversibility. Ejection fraction was normal. She has been stable overall and tends to get chest pain in the setting of stress but otherwise she has been doing reasonably well. [place]  Allergies  Allergen Reactions  . Antifungal [Miconazole Nitrate]   . Sulfa Antibiotics   . Z-Pak [Azithromycin]      Current Outpatient Prescriptions on File Prior to Visit  Medication Sig Dispense Refill  . aspirin 81 MG tablet Take 81 mg by mouth daily.    Marland Kitchen atorvastatin (LIPITOR) 10 MG tablet TAKE ONE TABLET BY MOUTH ONCE DAILY 90 tablet 3  . BIOTIN PO Take by mouth daily.    Marland Kitchen docusate (COLACE) 50 MG/5ML liquid 2-3 drops in each ear nightly 100 mL 0  . metoprolol tartrate (LOPRESSOR) 25 MG tablet TAKE ONE TABLET BY MOUTH TWICE DAILY 180 tablet 3  . nitroGLYCERIN (NITROSTAT) 0.4 MG SL tablet Place 1 tablet (0.4 mg total) under the tongue every 5 (five) minutes as needed for chest pain. 25 tablet 3  . pyridOXINE (VITAMIN B-6) 100 MG tablet Take 100 mg by mouth 3 (three) times a week.     . TURMERIC PO Take by mouth.     No current  facility-administered medications on file prior to visit.     Past Medical History  Diagnosis Date  . Essential hypertension, benign   . Cervicalgia   . Osteoarthritis   . Fibrocystic breast disease   . Polycystic ovarian disease   . History of anemia   . Gestational hypertension   . Coronary artery disease 02/2012    Abnormal stress test with anterior wall ischemia. Cardiac catheterization showed a 95% stenosis in the proximal LAD bifurcating with a 90% stenosis in first diagonal. Ejection fraction was 45% with anterior wall hypokinesis. She underwent balloon angioplasty to ostial first diagonal and drug-eluting stent placement to proximal LAD with a 3.0 x 15 mm Xience EX drug-eluting stent  . Other and unspecified hyperlipidemia   . Heart murmur   . Heart disease   . History of blood transfusion   . Hodgkin's lymphoma [facility])     s/p radiation and chemo therapy     Past Surgical History  Procedure Laterality Date  . Bladder sling    . Cardiac catheterization  02/2012    [facility]  . Coronary angioplasty  02/2012    left/right s/p balloon  . Cholecystectomy  1982  . Transobturator sling N/A 2009    [place]  . Heart stent'  2013  . Cervical polypectomy    . Colonoscopy with propofol N/A 02/05/2015    Procedure: COLONOSCOPY WITH PROPOFOL;  Surgeon: Lucilla Lame, MD;  Location: [facility] ENDOSCOPY;  Service: Endoscopy;  Laterality: N/A;     Family History  Problem Relation Age of Onset  . ALS Father   . Polymyositis Father   . Diabetes Brother   . Cancer Maternal Aunt     breast  . Breast cancer Maternal Aunt   . Stroke Maternal Grandmother   . Cancer Maternal Grandfather     prostate  . Stroke Maternal Grandfather      Social History   Social History  . Marital Status: Married    Spouse Name: N/A  . Number of Children: N/A  . Years of Education: N/A   Occupational History  . Not on file.   Social History Main Topics  . Smoking status: Former Smoker -- 1.00  packs/day for 30 years    Types: Cigarettes  . Smokeless tobacco: Never Used  . Alcohol Use: Yes     Comment: occasional  . Drug Use: No  . Sexual Activity: Not on file   Other Topics Concern  . Not on file   Social History Narrative     PHYSICAL EXAM   BP 118/80 mmHg  Pulse 89  Ht 5\' 1"  (1.549 m)  Wt 192 lb (87.091 kg)  BMI 36.30 kg/m2  Constitutional: She is oriented to person, place, and time. She appears well-developed and well-nourished. No distress.  HENT: No nasal discharge.  Head: Normocephalic and atraumatic.  Eyes: Pupils are equal and round. Right eye exhibits no discharge. Left eye exhibits no discharge.  Neck: Normal range of motion. Neck supple. No JVD present. No thyromegaly present.  Cardiovascular: Normal rate, regular rhythm, normal heart sounds. Exam reveals no gallop and no friction rub. No murmur heard.  Pulmonary/Chest: Effort normal and breath sounds normal. No stridor. No respiratory distress. She has no wheezes. She has no rales. She exhibits no tenderness.  Abdominal: Soft. Bowel sounds are normal. She exhibits no distension. There is no tenderness. There is no rebound and no guarding.  Musculoskeletal: Normal range of motion. She exhibits no edema and no tenderness.  Neurological: She is alert and oriented to person, place, and time. Coordination normal.  Skin: Skin is warm and dry. No rash noted. She is not diaphoretic. No erythema. No pallor.  Psychiatric: She has a normal mood and affect. Her behavior is normal. Judgment and thought content normal.   EKG: Normal sinus rhythm. Nonspecific ST changes.  ASSESSMENT AND PLAN

## 2015-05-03 NOTE — Assessment & Plan Note (Signed)
She is doing well overall with no symptoms suggestive of angina. Continue medical therapy. Follow-up with me in a yearly basis or earlier if needed.

## 2015-05-07 ENCOUNTER — Other Ambulatory Visit: Payer: Self-pay | Admitting: Cardiovascular Disease

## 2015-06-05 ENCOUNTER — Encounter: Payer: Self-pay | Admitting: Internal Medicine

## 2015-06-26 ENCOUNTER — Ambulatory Visit
Admission: RE | Admit: 2015-06-26 | Discharge: 2015-06-26 | Disposition: A | Discharge status: Home / Self Care | Payer: PPO | Source: Ambulatory Visit | Attending: Internal Medicine | Admitting: Internal Medicine

## 2015-06-26 DIAGNOSIS — M8580 Other specified disorders of bone density and structure, unspecified site: Secondary | ICD-10-CM

## 2015-06-26 DIAGNOSIS — Z780 Asymptomatic menopausal state: Secondary | ICD-10-CM | POA: Diagnosis not present

## 2015-06-26 DIAGNOSIS — M899 Disorder of bone, unspecified: Secondary | ICD-10-CM | POA: Diagnosis not present

## 2015-06-30 ENCOUNTER — Encounter: Payer: Self-pay | Admitting: Internal Medicine

## 2015-07-09 ENCOUNTER — Encounter: Payer: Self-pay | Admitting: Internal Medicine

## 2015-10-14 ENCOUNTER — Telehealth: Payer: Self-pay | Admitting: Internal Medicine

## 2015-10-14 MED ORDER — DIAZEPAM 5 MG PO TABS: 5.0000 mg | ORAL_TABLET | Freq: Two times a day (BID) | ORAL | Status: DC | PRN

## 2015-10-14 NOTE — Telephone Encounter (Signed)
Patient notified script sent to pharmacy and apologies given.

## 2015-10-14 NOTE — Telephone Encounter (Signed)
Please tell her how sorry we are , and we will call in an rx for valium (diazepam( that she can use up to twice daily .  She may want to start with 1/2 tablet . rx on printer

## 2015-10-14 NOTE — Telephone Encounter (Signed)
Caller name: Koreena Nogle Relationship to patient: patient Can be reached: [phone]  Reason for call: pt called to schedule appt needing something for anxiety. Pt husband passed away last 09/29/22. Please advise where to add to schedule.

## 2015-10-28 ENCOUNTER — Ambulatory Visit: Payer: PPO

## 2015-11-07 ENCOUNTER — Encounter: Payer: Self-pay | Admitting: Internal Medicine

## 2015-11-07 ENCOUNTER — Ambulatory Visit (INDEPENDENT_AMBULATORY_CARE_PROVIDER_SITE_OTHER): Payer: PPO

## 2015-11-07 VITALS — BP 138/70 | HR 62 | Temp 98.2°F | Resp 14 | Ht 61.0 in | Wt 186.8 lb

## 2015-11-07 DIAGNOSIS — Z0000 Encounter for general adult medical examination without abnormal findings: Secondary | ICD-10-CM

## 2015-11-07 DIAGNOSIS — Z1239 Encounter for other screening for malignant neoplasm of breast: Secondary | ICD-10-CM

## 2015-11-07 NOTE — Progress Notes (Signed)
  I have reviewed the above information and agree with above.   The lymph node finding is not an emergency.  She can keep her appt with me in one week  Deborra Medina, MD

## 2015-11-07 NOTE — Patient Instructions (Signed)
  Ms. Cryder , Thank you for taking time to come for your Medicare Wellness Visit. I appreciate your ongoing commitment to your health goals. Please review the following plan we discussed and let me know if I can assist you in the future.   Return in June for annual exam with [name].   This is a list of the screening recommended for you and due dates:  Health Maintenance  Topic Date Due  . Shingles Vaccine  09/30/2009  . Flu Shot  01/14/2016  . Pneumonia vaccines (2 of 2 - PPSV23) 04/28/2016  . Mammogram  12/13/2016  . Colon Cancer Screening  02/05/2020  . Tetanus Vaccine  10/04/2021  . DEXA scan (bone density measurement)  Completed  .  Hepatitis C: One time screening is recommended by Center for Disease Control  (CDC) for  adults born from [birth year] through 1966.   Completed

## 2015-11-07 NOTE — Progress Notes (Signed)
Subjective:   Jaime Hall is a 65 y.o. female who presents for Medicare Annual (Subsequent) preventive examination.  Review of Systems:  No ROS.  Medicare Wellness Visit.  Cardiac Risk Factors include: advanced age (>26men, >41 women);obesity (BMI >30kg/m2)     Objective:     Vitals: BP 138/70 mmHg  Pulse 62  Temp(Src) 98.2 F (36.8 C) (Oral)  Resp 14  Ht 5\' 1"  (1.549 m)  Wt 186 lb 12.8 oz (84.732 kg)  BMI 35.31 kg/m2  SpO2 96%  Body mass index is 35.31 kg/(m^2).   Tobacco History  Smoking status  . Former Smoker -- 1.00 packs/day for 30 years  . Types: Cigarettes  Smokeless tobacco  . Never Used     Counseling given: Not Answered   Past Medical History  Diagnosis Date  . Essential hypertension, benign   . Cervicalgia   . Osteoarthritis   . Fibrocystic breast disease   . Polycystic ovarian disease   . History of anemia   . Gestational hypertension   . Coronary artery disease 02/2012    Abnormal stress test with anterior wall ischemia. Cardiac catheterization showed a 95% stenosis in the proximal LAD bifurcating with a 90% stenosis in first diagonal. Ejection fraction was 45% with anterior wall hypokinesis. She underwent balloon angioplasty to ostial first diagonal and drug-eluting stent placement to proximal LAD with a 3.0 x 15 mm Xience EX drug-eluting stent  . Other and unspecified hyperlipidemia   . Heart murmur   . Heart disease   . History of blood transfusion   . Hodgkin's lymphoma [facility])     s/p radiation and chemo therapy   Past Surgical History  Procedure Laterality Date  . Bladder sling    . Cardiac catheterization  02/2012    [facility]  . Coronary angioplasty  02/2012    left/right s/p balloon  . Cholecystectomy  1982  . Transobturator sling N/A 2009    [place]  . Heart stent'  2013  . Cervical polypectomy    . Colonoscopy with propofol N/A 02/05/2015    Procedure: COLONOSCOPY WITH PROPOFOL;  Surgeon: Lucilla Lame, MD;  Location: [facility]  ENDOSCOPY;  Service: Endoscopy;  Laterality: N/A;   Family History  Problem Relation Age of Onset  . ALS Father   . Polymyositis Father   . Diabetes Brother   . Cancer Maternal Aunt     breast  . Breast cancer Maternal Aunt   . Stroke Maternal Grandmother   . Cancer Maternal Grandfather     prostate  . Stroke Maternal Grandfather    History  Sexual Activity  . Sexual Activity: No    Outpatient Encounter Prescriptions as of 11/07/2015  Medication Sig  . aspirin 81 MG tablet Take 81 mg by mouth daily.  Marland Kitchen atorvastatin (LIPITOR) 10 MG tablet TAKE ONE TABLET BY MOUTH ONCE DAILY  . BIOTIN PO Take by mouth daily.  . Cholecalciferol (VITAMIN D) 2000 UNITS tablet Take 2,000 Units by mouth daily.  . diazepam (VALIUM) 5 MG tablet Take 1 tablet (5 mg total) by mouth every 12 (twelve) hours as needed for anxiety.  . docusate (COLACE) 50 MG/5ML liquid 2-3 drops in each ear nightly  . metoprolol tartrate (LOPRESSOR) 25 MG tablet TAKE ONE TABLET BY MOUTH TWICE DAILY  . nitroGLYCERIN (NITROSTAT) 0.4 MG SL tablet Place 1 tablet (0.4 mg total) under the tongue every 5 (five) minutes as needed for chest pain.  Marland Kitchen pyridOXINE (VITAMIN B-6) 100 MG tablet Take 100 mg  by mouth 3 (three) times a week.   . TURMERIC PO Take by mouth.   No facility-administered encounter medications on file as of 11/07/2015.    Activities of Daily Living In your present state of health, do you have any difficulty performing the following activities: 11/07/2015  Hearing? N  Vision? N  Difficulty concentrating or making decisions? N  Walking or climbing stairs? Y  Dressing or bathing? N  Doing errands, shopping? N  Preparing Food and eating ? N  Using the Toilet? N  In the past six months, have you accidently leaked urine? Y  Do you have problems with loss of bowel control? N  Managing your Medications? N  Managing your Finances? N  Housekeeping or managing your Housekeeping? N    Patient Care Team: [name], MD as PCP - General (Internal Medicine) [name], MD as Consulting Physician (General Surgery)    Assessment:   This is a routine wellness examination for Jaime Hall. The goal of the wellness visit is to assist the patient how to close the gaps in care and create a preventative care plan for the patient.   Taking VIT D as appropriate/Osteoporosis risk reviewed.  Medications reviewed; taking without issues or barriers.  Safety issues reviewed; smoke and carbon monoxide detectors in the home. Firearms locked in a safe area in the home. Wears seatbelts when driving or riding with others. No violence in the home.  No identified risk were noted; The patient was oriented x 3; appropriate in dress and manner and no objective failures at ADL's or IADL's.   Requests mammogram; scheduled.  Educational material provided.  Depression; changes in PHQ2/PHQ9.  Recent death of spouse.  Taking medication as prescribed.  Stable and followed by PCP.   Patient Concerns:  Fear that small lymph node may be present on L side of neck x1 day. She states, " My neck felt a little funny when I was swallowing today and I was concerned that there may be something going on with my lymph nodes."  Not  present to the touch.  No tenderness.  No raise in the skin.  Hx of Hodgkin's lymphoma.  Intermittent dizziness x6 months.   Immediate appointment offered for follow up.  Patient declined and OK to wait for upcoming scheduled annual exam in 1 week.  Encouraged to stay hydrated and call the office if condition worsens.  Deferred to PCP for follow up.   Exercise Activities and Dietary recommendations Current Exercise Habits: The patient does not participate in regular exercise at present  Goals    . Healthy Lifestyle     Stay hydrated and drink plenty of fluids. Low carb foods.  Lean meats and vegetables. Stay active and start chair and standing exercises.  Educational material provided.  Progress  to yoga exercises, as tolerated.      Fall Risk Fall Risk  11/07/2015 10/29/2014 10/29/2014  Falls in the past year? No No No   Depression Screen PHQ 2/9 Scores 11/07/2015 10/29/2014  PHQ - 2 Score 2 0  PHQ- 9 Score 4 -     Cognitive Testing MMSE - Mini Mental State Exam 11/07/2015  Orientation to time 5  Orientation to Place 5  Registration 3  Attention/ Calculation 5  Recall 3  Language- name 2 objects 2  Language- repeat 1  Language- follow 3 step command 3  Language- read & follow direction 1  Write a sentence 1  Copy design 1  Total score  30    Immunization History  Administered Date(s) Administered  . Pneumococcal Conjugate-13 04/29/2015  . Tdap 10/05/2011   Screening Tests Health Maintenance  Topic Date Due  . ZOSTAVAX  09/30/2009  . INFLUENZA VACCINE  01/14/2016  . PNA vac Low Risk Adult (2 of 2 - PPSV23) 04/28/2016  . MAMMOGRAM  12/13/2016  . COLONOSCOPY  02/05/2020  . TETANUS/TDAP  10/04/2021  . DEXA SCAN  Completed  . Hepatitis C Screening  Completed      Plan:   End of life planning; Advance aging; Advanced directives discussed. No HCPOA/Living Will.  Educational material provided.  Time spent discussing short forms is 18 minutes.  Requested copy of short forms upon completion.   During the course of the visit the patient was educated and counseled about the following appropriate screening and preventive services:   Vaccines to include Pneumoccal, Influenza, Hepatitis B, Td, Zostavax, HCV  Electrocardiogram  Cardiovascular Disease  Colorectal cancer screening  Bone density screening  Diabetes screening  Glaucoma screening  Mammography/PAP  Nutrition counseling   Patient Instructions (the written plan) was given to the patient.   Varney Biles, LPN  [id number]

## 2015-11-13 ENCOUNTER — Ambulatory Visit: Payer: Self-pay | Admitting: Internal Medicine

## 2015-11-20 ENCOUNTER — Other Ambulatory Visit (INDEPENDENT_AMBULATORY_CARE_PROVIDER_SITE_OTHER): Payer: PPO

## 2015-11-20 ENCOUNTER — Telehealth: Payer: Self-pay | Admitting: *Deleted

## 2015-11-20 DIAGNOSIS — E785 Hyperlipidemia, unspecified: Secondary | ICD-10-CM

## 2015-11-20 DIAGNOSIS — R5383 Other fatigue: Secondary | ICD-10-CM

## 2015-11-20 DIAGNOSIS — E559 Vitamin D deficiency, unspecified: Secondary | ICD-10-CM

## 2015-11-20 LAB — COMPREHENSIVE METABOLIC PANEL WITH GFR
ALT: 14 U/L (ref 0–35)
AST: 16 U/L (ref 0–37)
Albumin: 3.8 g/dL (ref 3.5–5.2)
Alkaline Phosphatase: 63 U/L (ref 39–117)
BUN: 18 mg/dL (ref 6–23)
CO2: 28 meq/L (ref 19–32)
Calcium: 9.3 mg/dL (ref 8.4–10.5)
Chloride: 106 meq/L (ref 96–112)
Creatinine, Ser: 1.07 mg/dL (ref 0.40–1.20)
GFR: 54.51 mL/min — ABNORMAL LOW
Glucose, Bld: 98 mg/dL (ref 70–99)
Potassium: 4.7 meq/L (ref 3.5–5.1)
Sodium: 140 meq/L (ref 135–145)
Total Bilirubin: 1.4 mg/dL — ABNORMAL HIGH (ref 0.2–1.2)
Total Protein: 5.9 g/dL — ABNORMAL LOW (ref 6.0–8.3)

## 2015-11-20 LAB — LIPID PANEL
Cholesterol: 92 mg/dL (ref 0–200)
HDL: 40.8 mg/dL (ref >39.00)
LDL Cholesterol: 31 mg/dL (ref 0–99)
NonHDL: 50.88
Total CHOL/HDL Ratio: 2
Triglycerides: 99 mg/dL (ref 0.0–149.0)
VLDL: 19.8 mg/dL (ref 0.0–40.0)

## 2015-11-20 LAB — VITAMIN D 25 HYDROXY (VIT D DEFICIENCY, FRACTURES): VITD: 78.88 ng/mL (ref 30.00–100.00)

## 2015-11-20 LAB — TSH: TSH: 2.84 u[IU]/mL (ref 0.35–4.50)

## 2015-11-20 NOTE — Telephone Encounter (Signed)
Need lab orders placed. Pt came in this morning for fasting labs.

## 2015-11-22 ENCOUNTER — Ambulatory Visit (INDEPENDENT_AMBULATORY_CARE_PROVIDER_SITE_OTHER): Payer: PPO | Admitting: Internal Medicine

## 2015-11-22 ENCOUNTER — Encounter: Payer: Self-pay | Admitting: Internal Medicine

## 2015-11-22 VITALS — BP 128/68 | HR 90 | Temp 97.7°F | Resp 12 | Ht 62.25 in | Wt 184.5 lb

## 2015-11-22 DIAGNOSIS — N841 Polyp of cervix uteri: Secondary | ICD-10-CM

## 2015-11-22 DIAGNOSIS — F4321 Adjustment disorder with depressed mood: Secondary | ICD-10-CM

## 2015-11-22 DIAGNOSIS — E785 Hyperlipidemia, unspecified: Secondary | ICD-10-CM | POA: Diagnosis not present

## 2015-11-22 NOTE — Progress Notes (Signed)
Pre-visit discussion using our clinic review tool. No additional management support is needed unless otherwise documented below in the visit note.  

## 2015-11-22 NOTE — Patient Instructions (Addendum)
You can continue using the diazepam at the full dose (5 mg ) at bedtime as needed for insomnia   Get some exercise daily , goal is 30 minutes 5 days per week   We'll get your mammogram ordered   We'll do your wellness exam next time in 6 months

## 2015-11-22 NOTE — Progress Notes (Signed)
Patient ID: Jaime Hall, female    DOB: [birth year]  Age: 65 y.o. MRN: [id number]  The patient is here for annual Medicare wellness examination.  Patient is grieving the unexpected death of her husband, Elenore Rota, on April 26. She is having a difficult time accepting his loss, despite knowing that his heart was very weak. She has one grown son who lives nearby and she has the support of her church.  She has had some insomnia and decreased appetite.  She has been waking up early and having trouble returning to sleep using 2.5 mg valium.    PAP NORMAL 2016 Colonoscopy : hyperplastic polyps 2016 .  F/u 5 years  Mammogram: dense breasts BIRADS 14 December 2014   History Duanne has a past medical history of Essential hypertension, benign; Cervicalgia; Osteoarthritis; Fibrocystic breast disease; Polycystic ovarian disease; History of anemia; Gestational hypertension; Coronary artery disease (02/2012); Other and unspecified hyperlipidemia; Heart murmur; Heart disease; History of blood transfusion; and Hodgkin's lymphoma ([place]).   She has past surgical history that includes bladder sling; Cardiac catheterization (02/2012); Coronary angioplasty (02/2012); Cholecystectomy (1982); transobturator sling (N/A, 2009); heart stent' (2013); Cervical polypectomy; and Colonoscopy with propofol (N/A, 02/05/2015).   Her family history includes ALS in her father; Breast cancer in her maternal aunt; Cancer in her maternal aunt and maternal grandfather; Diabetes in her brother; Polymyositis in her father; Stroke in her maternal grandfather and maternal grandmother.She reports that she has quit smoking. Her smoking use included Cigarettes. She has a 30 pack-year smoking history. She has never used smokeless tobacco. She reports that she drinks alcohol. She reports that she does not use illicit drugs.  Outpatient Prescriptions Prior to Visit  Medication Sig Dispense Refill  . aspirin 81 MG tablet Take 81 mg by mouth daily.    Marland Kitchen  atorvastatin (LIPITOR) 10 MG tablet TAKE ONE TABLET BY MOUTH ONCE DAILY 90 tablet 3  . Cholecalciferol (VITAMIN D) 2000 UNITS tablet Take 2,000 Units by mouth daily.    Marland Kitchen docusate (COLACE) 50 MG/5ML liquid 2-3 drops in each ear nightly 100 mL 0  . metoprolol tartrate (LOPRESSOR) 25 MG tablet TAKE ONE TABLET BY MOUTH TWICE DAILY 180 tablet 3  . nitroGLYCERIN (NITROSTAT) 0.4 MG SL tablet Place 1 tablet (0.4 mg total) under the tongue every 5 (five) minutes as needed for chest pain. 25 tablet 3  . TURMERIC PO Take by mouth.    Marland Kitchen BIOTIN PO Take by mouth daily. Reported on 11/22/2015    . diazepam (VALIUM) 5 MG tablet Take 1 tablet (5 mg total) by mouth every 12 (twelve) hours as needed for anxiety. (Patient not taking: Reported on 11/22/2015) 30 tablet 1  . pyridOXINE (VITAMIN B-6) 100 MG tablet Take 100 mg by mouth 3 (three) times a week. Reported on 11/22/2015     No facility-administered medications prior to visit.    Review of Systems   Patient denies headache, fevers, malaise, unintentional weight loss, skin rash, eye pain, sinus congestion and sinus pain, sore throat, dysphagia,  hemoptysis , cough, dyspnea, wheezing, chest pain, palpitations, orthopnea, edema, abdominal pain, nausea, melena, diarrhea, constipation, flank pain, dysuria, hematuria, urinary  Frequency, nocturia, numbness, tingling, seizures,  Focal weakness, Loss of consciousness,  Tremor,depression, anxiety, and suicidal ideation.     Objective:  BP 128/68 mmHg  Pulse 90  Temp(Src) 97.7 F (36.5 C) (Oral)  Resp 12  Ht 5' 2.25" (1.581 m)  Wt 184 lb 8 oz (83.689 kg)  BMI 33.48 kg/m2  SpO2  95%  Physical Exam   General appearance: alert, cooperative and appears stated age Head: Normocephalic, without obvious abnormality, atraumatic Eyes: conjunctivae/corneas clear. PERRL, EOM's intact. Fundi benign. Ears: normal TM's and external ear canals both ears Nose: Nares normal. Septum midline. Mucosa normal. No drainage or sinus  tenderness. Throat: lips, mucosa, and tongue normal; teeth and gums normal Neck: no adenopathy, no carotid bruit, no JVD, supple, symmetrical, trachea midline and thyroid not enlarged, symmetric, no tenderness/mass/nodules Lungs: clear to auscultation bilaterally Breasts: pendulous appearance, no masses or tenderness Heart: regular rate and rhythm, S1, S2 normal, no murmur, click, rub or gallop Abdomen: soft, non-tender; bowel sounds normal; no masses,  no organomegaly Extremities: extremities normal, atraumatic, no cyanosis or edema Pulses: 2+ and symmetric Skin: Skin color, texture, turgor normal. No rashes or lesions Neurologic: Alert and oriented X 3, normal strength and tone. Normal symmetric reflexes. Normal coordination and gait.     Assessment & Plan:   Problem List Items Addressed This Visit    Hyperlipidemia    Managed with  High potency statin ,  Low dose.   LDL and triglycerides are at goal on current medications. She  has no side effects and liver enzymes are normal. No changes today  Lab Results  Component Value Date   CHOL 92 11/20/2015   HDL 40.80 11/20/2015   LDLCALC 31 11/20/2015   TRIG 99.0 11/20/2015   CHOLHDL 2 11/20/2015   Lab Results  Component Value Date   ALT 14 11/20/2015   AST 16 11/20/2015   ALKPHOS 63 11/20/2015   BILITOT 1.4* 11/20/2015               Cervical polyp    Benign biopsy by Dr Garwin Brothers May 2016      Grief reaction - Primary    Patient is dealing with the unexpected loss of spouse and has adequate coping skills and emotional support .  Addressed her insomnia, advised her to increase her valium to 5 mg at bedtime if needed.  I have asked patient to return in one month to examine for signs of unresolving grief.          I am having Ms. Weisinger maintain her aspirin, pyridOXINE, BIOTIN PO, nitroGLYCERIN, atorvastatin, TURMERIC PO, docusate, Vitamin D, metoprolol tartrate, and diazepam.  No orders of the defined types were  placed in this encounter.    There are no discontinued medications.  Follow-up: Return in about 6 months (around 05/23/2016), or wellness postponed.   [name], MD

## 2015-11-24 ENCOUNTER — Encounter: Payer: Self-pay | Admitting: Internal Medicine

## 2015-11-24 DIAGNOSIS — Z634 Disappearance and death of family member: Secondary | ICD-10-CM

## 2015-11-24 DIAGNOSIS — F4329 Adjustment disorder with other symptoms: Secondary | ICD-10-CM | POA: Insufficient documentation

## 2015-11-24 DIAGNOSIS — F4321 Adjustment disorder with depressed mood: Secondary | ICD-10-CM | POA: Insufficient documentation

## 2015-11-24 NOTE — Assessment & Plan Note (Signed)
Patient is dealing with the unexpected loss of spouse and has adequate coping skills and emotional support .  Addressed her insomnia, advised her to increase her valium to 5 mg at bedtime if needed.  I have asked patient to return in one month to examine for signs of unresolving grief.

## 2015-11-24 NOTE — Assessment & Plan Note (Signed)
Benign biopsy by Dr Garwin Brothers May 2016

## 2015-11-24 NOTE — Assessment & Plan Note (Signed)
Managed with  High potency statin ,  Low dose.   LDL and triglycerides are at goal on current medications. She  has no side effects and liver enzymes are normal. No changes today  Lab Results  Component Value Date   CHOL 92 11/20/2015   HDL 40.80 11/20/2015   LDLCALC 31 11/20/2015   TRIG 99.0 11/20/2015   CHOLHDL 2 11/20/2015   Lab Results  Component Value Date   ALT 14 11/20/2015   AST 16 11/20/2015   ALKPHOS 63 11/20/2015   BILITOT 1.4* 11/20/2015

## 2015-11-26 ENCOUNTER — Encounter: Payer: Self-pay | Admitting: Internal Medicine

## 2015-12-12 ENCOUNTER — Encounter: Payer: Self-pay | Admitting: Internal Medicine

## 2015-12-13 ENCOUNTER — Encounter: Payer: Self-pay | Admitting: Internal Medicine

## 2015-12-16 ENCOUNTER — Ambulatory Visit: Payer: PPO | Admitting: Internal Medicine

## 2015-12-18 ENCOUNTER — Ambulatory Visit (INDEPENDENT_AMBULATORY_CARE_PROVIDER_SITE_OTHER): Payer: PPO | Admitting: Family Medicine

## 2015-12-18 VITALS — BP 142/82 | HR 78 | Temp 97.8°F | Ht 61.0 in | Wt 181.5 lb

## 2015-12-18 DIAGNOSIS — R55 Syncope and collapse: Secondary | ICD-10-CM

## 2015-12-18 DIAGNOSIS — R053 Chronic cough: Secondary | ICD-10-CM

## 2015-12-18 DIAGNOSIS — R05 Cough: Secondary | ICD-10-CM

## 2015-12-18 LAB — CBC WITH DIFFERENTIAL/PLATELET
Basophils Absolute: 0 10*3/uL (ref 0.0–0.1)
Basophils Relative: 0.5 % (ref 0.0–3.0)
Eosinophils Absolute: 0.2 10*3/uL (ref 0.0–0.7)
Eosinophils Relative: 2.7 % (ref 0.0–5.0)
HCT: 43.5 % (ref 36.0–46.0)
Hemoglobin: 14.4 g/dL (ref 12.0–15.0)
Lymphocytes Relative: 16.7 % (ref 12.0–46.0)
Lymphs Abs: 1.4 10*3/uL (ref 0.7–4.0)
MCHC: 33.2 g/dL (ref 30.0–36.0)
MCV: 89.1 fl (ref 78.0–100.0)
Monocytes Absolute: 0.5 10*3/uL (ref 0.1–1.0)
Monocytes Relative: 6.2 % (ref 3.0–12.0)
Neutro Abs: 6.3 10*3/uL (ref 1.4–7.7)
Neutrophils Relative %: 73.9 % (ref 43.0–77.0)
Platelets: 230 10*3/uL (ref 150.0–400.0)
RBC: 4.89 Mil/uL (ref 3.87–5.11)
RDW: 13.6 % (ref 11.5–15.5)
WBC: 8.6 10*3/uL (ref 4.0–10.5)

## 2015-12-18 MED ORDER — GUAIFENESIN-CODEINE 100-10 MG/5ML PO SOLN
5.0000 mL | Freq: Three times a day (TID) | ORAL | Status: DC | PRN
Start: 2015-12-18 — End: 2016-02-03

## 2015-12-18 MED ORDER — ALBUTEROL SULFATE HFA 108 (90 BASE) MCG/ACT IN AERS: 2.0000 | INHALATION_SPRAY | Freq: Four times a day (QID) | RESPIRATORY_TRACT | Status: DC | PRN

## 2015-12-18 NOTE — Progress Notes (Signed)
Subjective:  Patient ID: Jaime Hall, female    DOB: 1951/03/26  Age: 65 y.o. MRN: [id number]  CC: Cough  HPI:  65 year old female complicated past medical history including CAD, prior history of lymphoma, multiple pulmonary nodules presents with complaints of cough.  Patient reports that she's had a mildly productive cough since November. She states that this started after she had a pneumococcal vaccine. She's been seen at the walk-in clinic for this in December. She was treated with Tessalon. Patient had no improvement with treatment. She's continued to have persistent cough. No associated fevers, shortness of breath, chills, weight loss, night sweats. She does report associated hoarseness. Patient has had lymphoma in the past and she is concerned that something is wrong.  Additionally, patient reports that she's had recent episodes of presyncope. She states that she has felt as if she was going to pass out. Most recent episode was last week. She states that she was sitting down. He states that things felt like they were going dark and that she was going to pass out. She states that it lasted about a minute and then resolve spontaneously. No associated chest pain, shortness of breath, palpitations. No known exacerbating or relieving factors.  Social Hx   Social History   Social History  . Marital Status: Married    Spouse Name: N/A  . Number of Children: N/A  . Years of Education: N/A   Social History Main Topics  . Smoking status: Former Smoker -- 1.00 packs/day for 30 years    Types: Cigarettes  . Smokeless tobacco: Never Used  . Alcohol Use: Yes     Comment: occasional  . Drug Use: No  . Sexual Activity: No   Other Topics Concern  . Not on file   Social History Narrative   Review of Systems  Constitutional: Negative for fever and chills.  Respiratory: Positive for cough. Negative for shortness of breath.   Cardiovascular: Negative for chest pain and palpitations.    Neurological: Positive for light-headedness.   Objective:  BP 142/82 mmHg  Pulse 78  Temp(Src) 97.8 F (36.6 C) (Oral)  Ht 5\' 1"  (1.549 m)  Wt 181 lb 8 oz (82.328 kg)  BMI 34.31 kg/m2  SpO2 97%  BP/Weight 12/18/2015 11/22/2015 99991111  Systolic BP [id number]  Diastolic BP 82 68 70  Wt. (Lbs) 181.5 184.5 186.8  BMI 34.31 33.48 35.31   Physical Exam  Constitutional: She is oriented to person, place, and time. She appears well-developed. No distress.  HENT:  Head: Normocephalic and atraumatic.  Eyes: Conjunctivae are normal. No scleral icterus.  Cardiovascular: Normal rate and regular rhythm.   Pulmonary/Chest: Effort normal. She has no wheezes. She has no rales.  Neurological: She is alert and oriented to person, place, and time.  Psychiatric:  Flat affect, depressed mood.  Vitals reviewed.  Lab Results  Component Value Date   WBC 8.6 12/18/2015   HGB 14.4 12/18/2015   HCT 43.5 12/18/2015   PLT 230.0 12/18/2015   GLUCOSE 98 11/20/2015   CHOL 92 11/20/2015   TRIG 99.0 11/20/2015   HDL 40.80 11/20/2015   LDLCALC 31 11/20/2015   ALT 14 11/20/2015   AST 16 11/20/2015   NA 140 11/20/2015   K 4.7 11/20/2015   CL 106 11/20/2015   CREATININE 1.07 11/20/2015   BUN 18 11/20/2015   CO2 28 11/20/2015   TSH 2.84 11/20/2015   INR 1.0 02/29/2012   MICROALBUR 0.8 09/23/2012   Assessment &  Plan:   Problem List Items Addressed This Visit    Chronic cough - Primary    New problem. Unclear etiology at this time. 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 Given history and the fact that she's former smoker, obtaining CT chest. Albuterol as needed. Guaifenesin with codeine for cough.       Relevant Orders   CBC w/Diff (Completed)   CT Chest W Contrast   Pre-syncope    Unclear etiology. No preceding symptoms. Has a cardiac history. Will see if cardiology can see her sooner that her current scheduled appointment.         Meds  ordered this encounter  Medications  . albuterol (PROVENTIL HFA;VENTOLIN HFA) 108 (90 Base) MCG/ACT inhaler    Sig: Inhale 2 puffs into the lungs every 6 (six) hours as needed for wheezing or shortness of breath.    Dispense:  1 Inhaler    Refill:  0  . guaiFENesin-codeine 100-10 MG/5ML syrup    Sig: Take 5 mLs by mouth every 8 (eight) hours as needed for cough.    Dispense:  120 mL    Refill:  0   Follow-up: PRN  [place]

## 2015-12-18 NOTE — Patient Instructions (Signed)
Use the albuterol and cough medicine as needed.  We will call you with the results.  Please follow up with [name] soon.  Take care  Dr. Lacinda Axon

## 2015-12-18 NOTE — Progress Notes (Signed)
Pre visit review using our clinic review tool, if applicable. No additional management support is needed unless otherwise documented below in the visit note. 

## 2015-12-19 DIAGNOSIS — J479 Bronchiectasis, uncomplicated: Secondary | ICD-10-CM | POA: Insufficient documentation

## 2015-12-19 DIAGNOSIS — R55 Syncope and collapse: Secondary | ICD-10-CM | POA: Insufficient documentation

## 2015-12-19 NOTE — Assessment & Plan Note (Signed)
Unclear etiology. No preceding symptoms. Has a cardiac history. Will see if cardiology can see her sooner that her current scheduled appointment.

## 2015-12-19 NOTE — Assessment & Plan Note (Signed)
New problem. Unclear etiology at this time. 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 Given history and the fact that she's former smoker, obtaining CT chest. Albuterol as needed. Guaifenesin with codeine for cough.

## 2015-12-23 ENCOUNTER — Ambulatory Visit: Payer: PPO | Admitting: Internal Medicine

## 2015-12-24 ENCOUNTER — Ambulatory Visit
Admission: RE | Admit: 2015-12-24 | Discharge: 2015-12-24 | Disposition: A | Discharge status: Home / Self Care | Payer: PPO | Source: Ambulatory Visit | Attending: Family Medicine | Admitting: Family Medicine

## 2015-12-24 DIAGNOSIS — R05 Cough: Secondary | ICD-10-CM | POA: Insufficient documentation

## 2015-12-24 DIAGNOSIS — R053 Chronic cough: Secondary | ICD-10-CM

## 2015-12-24 MED ORDER — IOPAMIDOL (ISOVUE-300) INJECTION 61%
75.0000 mL | Freq: Once | INTRAVENOUS | Status: AC | PRN
  Administered 2015-12-24: 75 mL via INTRAVENOUS

## 2015-12-26 ENCOUNTER — Other Ambulatory Visit: Payer: Self-pay | Admitting: Internal Medicine

## 2015-12-26 ENCOUNTER — Ambulatory Visit
Admission: RE | Admit: 2015-12-26 | Discharge: 2015-12-26 | Disposition: A | Discharge status: Home / Self Care | Payer: PPO | Source: Ambulatory Visit | Attending: Internal Medicine | Admitting: Internal Medicine

## 2015-12-26 DIAGNOSIS — Z1231 Encounter for screening mammogram for malignant neoplasm of breast: Secondary | ICD-10-CM | POA: Insufficient documentation

## 2015-12-26 DIAGNOSIS — Z1239 Encounter for other screening for malignant neoplasm of breast: Secondary | ICD-10-CM | POA: Diagnosis not present

## 2016-01-10 ENCOUNTER — Encounter: Payer: Self-pay | Admitting: Internal Medicine

## 2016-01-13 ENCOUNTER — Telehealth: Payer: Self-pay | Admitting: Internal Medicine

## 2016-01-13 NOTE — Telephone Encounter (Signed)
Scheduled for 430 on Wednesday, thanks

## 2016-01-13 NOTE — Telephone Encounter (Signed)
You can offer her 11:30 or 4:30 on Wednesday

## 2016-01-13 NOTE — Telephone Encounter (Signed)
Pt called about needing to schedule a follow up with Dr Derrel Nip. No appt available til 09/05 where I can sch. Let me know where I can sch pt. Pt stated if she can't come in soon she does not want to come in. Dr Lacinda Axon stated pt needs to follow up with pcp.  Please advise?  Call pt @ [phone] or cell [phone]. Thank you!

## 2016-01-13 NOTE — Telephone Encounter (Signed)
Patient was seen on 12/18/2015 by Dr. Lacinda Axon.  Sent a follow up mychart on the 28th, not any better, he requested her to follow up with PCP.  Please advise a schedule Date and time, thanks

## 2016-01-14 ENCOUNTER — Other Ambulatory Visit: Payer: Self-pay | Admitting: Cardiovascular Disease

## 2016-01-15 ENCOUNTER — Ambulatory Visit (INDEPENDENT_AMBULATORY_CARE_PROVIDER_SITE_OTHER): Payer: PPO | Admitting: Internal Medicine

## 2016-01-15 ENCOUNTER — Encounter: Payer: Self-pay | Admitting: Internal Medicine

## 2016-01-15 VITALS — BP 120/68 | HR 81 | Temp 98.3°F | Resp 18 | Wt 183.5 lb

## 2016-01-15 DIAGNOSIS — R05 Cough: Secondary | ICD-10-CM | POA: Diagnosis not present

## 2016-01-15 DIAGNOSIS — J470 Bronchiectasis with acute lower respiratory infection: Secondary | ICD-10-CM

## 2016-01-15 DIAGNOSIS — R053 Chronic cough: Secondary | ICD-10-CM

## 2016-01-15 MED ORDER — BENZONATATE 200 MG PO CAPS: 200.0000 mg | ORAL_CAPSULE | Freq: Three times a day (TID) | ORAL | 5 refills | Status: DC | PRN

## 2016-01-15 MED ORDER — TIOTROPIUM BROMIDE MONOHYDRATE 18 MCG IN CAPS: 18.0000 ug | ORAL_CAPSULE | Freq: Every day | RESPIRATORY_TRACT | 12 refills | Status: DC

## 2016-01-15 MED ORDER — AMOXICILLIN 500 MG PO CAPS: 1000.0000 mg | ORAL_CAPSULE | Freq: Three times a day (TID) | ORAL | 0 refills | Status: DC

## 2016-01-15 NOTE — Progress Notes (Signed)
Subjective:  Patient ID: Jaime Hall, female    DOB: 31-Oct-1950  Age: 65 y.o. MRN: [id number]  CC: The primary encounter diagnosis was Bronchiectasis with acute lower respiratory infection ([place]). A diagnosis of Chronic cough was also pertinent to this visit.  HPI Jaime Hall presents for persistent cough .  The cough has been present since November and is productive at times of white to yellow sputum, but not always.  [facility] with cough supressant, with no improvement.  No unintentional weight loss, night seats or other  symptoms suggestive of lymphoma recurrence or community acquired pneumonia.  The cough will transiently resolvefor a few days and the return.  She has not been treated with antibiotics during prior evaluations.  She was seen by Dr Lacinda Axon in early January and evaluated with chest CT due to history of tobacco abuse and lymphoma with prior XRT. Bronchiectasis was reported but not discussed with patient.  Albuterol MDI was prescribed along with Tussionex which she uses to suppress cough at night. 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  Still grieving from the unexpected loss of husband on April 26. Going with family to beach for a week and worried about being around her grandson who has CYSTIC FIBROSIS.   Outpatient Medications Prior to Visit  Medication Sig Dispense Refill  . albuterol (PROVENTIL HFA;VENTOLIN HFA) 108 (90 Base) MCG/ACT inhaler Inhale 2 puffs into the lungs every 6 (six) hours as needed for wheezing or shortness of breath. 1 Inhaler 0  . aspirin 81 MG tablet Take 81 mg by mouth daily.    Marland Kitchen atorvastatin (LIPITOR) 10 MG tablet TAKE ONE TABLET BY MOUTH ONCE DAILY 90 tablet 3  . BIOTIN PO Take by mouth daily. Reported on 11/22/2015    . Cholecalciferol (VITAMIN D) 2000 UNITS tablet Take 2,000 Units by mouth daily. Reported on 12/18/2015    . diazepam (VALIUM) 5 MG tablet Take 1 tablet  (5 mg total) by mouth every 12 (twelve) hours as needed for anxiety. 30 tablet 1  . docusate (COLACE) 50 MG/5ML liquid 2-3 drops in each ear nightly 100 mL 0  . guaiFENesin-codeine 100-10 MG/5ML syrup Take 5 mLs by mouth every 8 (eight) hours as needed for cough. 120 mL 0  . metoprolol tartrate (LOPRESSOR) 25 MG tablet TAKE ONE TABLET BY MOUTH TWICE DAILY 180 tablet 3  . nitroGLYCERIN (NITROSTAT) 0.4 MG SL tablet Place 1 tablet (0.4 mg total) under the tongue every 5 (five) minutes as needed for chest pain. 25 tablet 3  . pyridOXINE (VITAMIN B-6) 100 MG tablet Take 100 mg by mouth 3 (three) times a week. Reported on 11/22/2015    . TURMERIC PO Take by mouth.    Marland Kitchen atorvastatin (LIPITOR) 10 MG tablet TAKE ONE TABLET BY MOUTH ONCE DAILY 90 tablet 0   No facility-administered medications prior to visit.     Review of Systems;  Patient denies headache, fevers, malaise, unintentional weight loss, skin rash, eye pain, sinus congestion and sinus pain, sore throat, dysphagia,  hemoptysis , cough, dyspnea, wheezing, chest pain, palpitations, orthopnea, edema, abdominal pain, nausea, melena, diarrhea, constipation, flank pain, dysuria, hematuria, urinary  Frequency, nocturia, numbness, tingling, seizures,  Focal weakness, Loss of consciousness,  Tremor, insomnia, depression, anxiety, and suicidal ideation.      Objective:  BP 120/68 (BP Location: Left Arm, Patient Position: Sitting, Cuff Size: Normal)   Pulse 81   Temp  98.3 F (36.8 C) (Oral)   Resp 18   Wt 183 lb 8 oz (83.2 kg)   SpO2 98%   BMI 34.67 kg/m   BP Readings from Last 3 Encounters:  01/15/16 120/68  12/18/15 (!) 142/82  11/22/15 128/68    Wt Readings from Last 3 Encounters:  01/15/16 183 lb 8 oz (83.2 kg)  12/18/15 181 lb 8 oz (82.3 kg)  11/22/15 184 lb 8 oz (83.7 kg)    General appearance: alert, cooperative and appears stated age Ears: normal TM's and external ear canals both ears Throat: lips, mucosa, and tongue normal;  teeth and gums normal Neck: no adenopathy, no carotid bruit, supple, symmetrical, trachea midline and thyroid not enlarged, symmetric, no tenderness/mass/nodules Back: symmetric, no curvature. ROM normal. No CVA tenderness. Lungs: clear to auscultation bilaterally Heart: regular rate and rhythm, S1, S2 normal, no murmur, click, rub or gallop Abdomen: soft, non-tender; bowel sounds normal; no masses,  no organomegaly Pulses: 2+ and symmetric Skin: Skin color, texture, turgor normal. No rashes or lesions Lymph nodes: Cervical, supraclavicular, and axillary nodes normal.  No results found for: HGBA1C  Lab Results  Component Value Date   CREATININE 1.07 11/20/2015   CREATININE 1.13 04/29/2015   CREATININE 1.00 04/11/2015    Lab Results  Component Value Date   WBC 8.6 12/18/2015   HGB 14.4 12/18/2015   HCT 43.5 12/18/2015   PLT 230.0 12/18/2015   GLUCOSE 98 11/20/2015   CHOL 92 11/20/2015   TRIG 99.0 11/20/2015   HDL 40.80 11/20/2015   LDLCALC 31 11/20/2015   ALT 14 11/20/2015   AST 16 11/20/2015   NA 140 11/20/2015   K 4.7 11/20/2015   CL 106 11/20/2015   CREATININE 1.07 11/20/2015   BUN 18 11/20/2015   CO2 28 11/20/2015   TSH 2.84 11/20/2015   INR 1.0 02/29/2012   MICROALBUR 0.8 09/23/2012    Mammogram Digital Screening  Result Date: 12/27/2015 CLINICAL DATA:  Screening. EXAM: DIGITAL SCREENING BILATERAL MAMMOGRAM WITH CAD COMPARISON:  Previous exam(s). ACR Breast Density Category c: The breast tissue is heterogeneously dense, which may obscure small masses. FINDINGS: There are no findings suspicious for malignancy. Images were processed with CAD. IMPRESSION: No mammographic evidence of malignancy. A result letter of this screening mammogram will be mailed directly to the patient. RECOMMENDATION: Screening mammogram in one year. (Code:SM-B-01Y) BI-RADS CATEGORY  1: Negative. Electronically Signed   By: Ammie Ferrier M.D.   On: 12/27/2015 08:17    Assessment & Plan:    Problem List Items Addressed This Visit    Chronic cough    Likely due to bronchiectasis plus/minus pulmonary fibrosis from XRT.  empiric amoxicillin x 7 days,  Add spiriva daily ,  Cough supression with tessalon,  Pulmonology referral. Daily use of Probiotics for  3 weeks advised to reduce risk of C dificile colitis.        Other Visit Diagnoses    Bronchiectasis with acute lower respiratory infection ([place])    -  Primary   Relevant Orders   Ambulatory referral to Pulmonology     A total of 25 minutes of face to face time was spent with patient more than half of which was spent in counselling about the above mentioned conditions  and coordination of care  I am having Ms. Seda start on benzonatate, amoxicillin, and tiotropium. I am also having her maintain her aspirin, pyridOXINE, BIOTIN PO, nitroGLYCERIN, atorvastatin, TURMERIC PO, docusate, Vitamin D, metoprolol tartrate, diazepam, albuterol, guaiFENesin-codeine, and  atorvastatin.  Meds ordered this encounter  Medications  . benzonatate (TESSALON) 200 MG capsule    Sig: Take 1 capsule (200 mg total) by mouth 3 (three) times daily as needed for cough.    Dispense:  90 capsule    Refill:  5  . amoxicillin (AMOXIL) 500 MG capsule    Sig: Take 2 capsules (1,000 mg total) by mouth 3 (three) times daily.    Dispense:  21 capsule    Refill:  0  . tiotropium (SPIRIVA HANDIHALER) 18 MCG inhalation capsule    Sig: Place 1 capsule (18 mcg total) into inhaler and inhale daily.    Dispense:  30 capsule    Refill:  12    There are no discontinued medications.  Follow-up: No Follow-up on file.   [name], MD

## 2016-01-15 NOTE — Patient Instructions (Signed)
I believe your chronic cough is due to bronchiectasis, which  Can be controlled but not cured  I am prescribing a week of antibiotics (amoxicilline three times daily with food),  The cough capsules, you have used before, and an inhaler called Spiriva to use once daily, to help dry up your secretions  Pulmonology referral is in process   Bronchiectasis Bronchiectasis is a condition in which the airways (bronchi) are damaged and widened. This makes it difficult for the lungs to get rid of mucus. As a result, mucus gathers in the airways, and this often leads to lung infections. Infection can cause inflammation in the airways, which may further weaken and damage the bronchi.  CAUSES  Bronchiectasis may be present at birth (congenital) or may develop later in life. Sometimes there is no apparent cause. Some common causes include:  Cystic fibrosis.   Recurrent lung infections (such as pneumonia, tuberculosis, or fungal infections).  Foreign bodies or other blockages in the lungs.  Breathing in fluid, food, or other foreign objects (aspiration). SIGNS AND SYMPTOMS  Common symptoms include:  A daily cough that brings up mucus and lasts for more than 3 weeks.  Frequent lung infections (such as pneumonia, tuberculosis, or fungal infections).  Shortness of breath and wheezing.   Weakness and fatigue. DIAGNOSIS  Various tests may be done to help diagnose bronchiectasis. Tests may include:  Chest X-rays or CT scans.   Breathing tests to help determine how your lungs are working.   Sputum cultures to check for infection.   Blood tests and other tests to check for related diseases or causes, such as cystic fibrosis. TREATMENT  Treatment varies depending on the severity of the condition. Medicines may be given to loosen the mucus to be coughed up (expectorants), to relax the muscles of the air passages (bronchodilators), or to prevent or treat infections (antibiotics). Physical therapy  methods may be recommended to help clear mucus from the lungs. For severe cases, surgery may be done to remove the affected part of the lung. HOME CARE INSTRUCTIONS   Get plenty of rest.   Only take over-the-counter or prescription medicines as directed by your health care provider. If antibiotic medicines were prescribed, take them as directed. Finish them even if you start to feel better.  Avoid sedatives and antihistamines unless otherwise directed by your health care provider. These medicines tend to thicken the mucus in the lungs.   Perform any breathing exercises or techniques to clear the lungs as directed by your health care provider.  Drink enough fluids to keep your urine clear or pale yellow.  Consider using a cold steam vaporizer or humidifier in your room or home to help loosen secretions.   If the cough is worse at night, try sleeping in a semi-upright position in a recliner or using a couple of pillows.   Avoid cigarette smoke and lung irritants. If you smoke, quit.  Stay inside when pollution and ozone levels are high.   Stay current with vaccinations and immunizations.   Follow up with your health care provider as directed.  SEEK MEDICAL CARE IF:  You cough up more thick, discolored mucus (sputum) that is yellow to green in color.  You have a fever or persistent symptoms for more than 2-3 days.  You cannot control your cough and are losing sleep. SEEK IMMEDIATE MEDICAL CARE IF:   You cough up blood.   You have chest pain or increasing shortness of breath.   You have pain that  is getting worse or is uncontrolled with medicines.   You have a fever and your symptoms suddenly get worse. MAKE SURE YOU:  Understand these instructions.   Will watch your condition.   Will get help right away if you are not doing well or get worse.    This information is not intended to replace advice given to you by your health care provider. Make sure you  discuss any questions you have with your health care provider.   Document Released: 03/29/2007 Document Revised: 06/06/2013 Document Reviewed: 12/07/2012 Elsevier Interactive Patient Education [employer].

## 2016-01-15 NOTE — Progress Notes (Signed)
Pre-visit discussion using our clinic review tool. No additional management support is needed unless otherwise documented below in the visit note.  

## 2016-01-15 NOTE — Assessment & Plan Note (Addendum)
Likely due to bronchiectasis plus/minus pulmonary fibrosis from XRT.  empiric amoxicillin x 7 days,  Add spiriva daily ,  Cough supression with tessalon,  Pulmonology referral. Daily use of Probiotics for  3 weeks advised to reduce risk of C dificile colitis.

## 2016-02-03 ENCOUNTER — Encounter: Payer: Self-pay | Admitting: Pulmonary Disease

## 2016-02-03 ENCOUNTER — Ambulatory Visit (INDEPENDENT_AMBULATORY_CARE_PROVIDER_SITE_OTHER): Payer: PPO | Admitting: Pulmonary Disease

## 2016-02-03 VITALS — BP 130/62 | HR 78 | Ht 62.0 in | Wt 185.8 lb

## 2016-02-03 DIAGNOSIS — R05 Cough: Secondary | ICD-10-CM

## 2016-02-03 DIAGNOSIS — R053 Chronic cough: Secondary | ICD-10-CM

## 2016-02-03 DIAGNOSIS — J387 Other diseases of larynx: Secondary | ICD-10-CM | POA: Diagnosis not present

## 2016-02-03 DIAGNOSIS — K219 Gastro-esophageal reflux disease without esophagitis: Secondary | ICD-10-CM

## 2016-02-03 MED ORDER — DEXLANSOPRAZOLE 30 MG PO CPDR: 30.0000 mg | DELAYED_RELEASE_CAPSULE | Freq: Every day | ORAL | 10 refills | Status: DC

## 2016-02-03 NOTE — Patient Instructions (Signed)
Dexilant 30 mg daily - continue on this medication until you see me again in follow up  You may stop the Spiriva inhaler

## 2016-02-04 ENCOUNTER — Ambulatory Visit (INDEPENDENT_AMBULATORY_CARE_PROVIDER_SITE_OTHER): Payer: PPO | Admitting: Cardiovascular Disease

## 2016-02-04 ENCOUNTER — Encounter: Payer: Self-pay | Admitting: Cardiovascular Disease

## 2016-02-04 VITALS — BP 120/60 | HR 69 | Ht 62.0 in | Wt 183.8 lb

## 2016-02-04 DIAGNOSIS — I25118 Atherosclerotic heart disease of native coronary artery with other forms of angina pectoris: Secondary | ICD-10-CM

## 2016-02-04 DIAGNOSIS — E785 Hyperlipidemia, unspecified: Secondary | ICD-10-CM | POA: Diagnosis not present

## 2016-02-04 NOTE — Progress Notes (Signed)
Cardiology Office Note   Date:  02/04/2016   ID:  Jaime Hall, DOB June 21, 1950, MRN [id number]  PCP:  [name], MD  Cardiologist:   [name], MD   Chief Complaint  Patient presents with  . Coronary Artery Disease    some dizziness, per pt      History of Present Illness: Jaime Hall is a 65 y.o. female who presents for  a followup visit regarding coronary artery disease.  She has history of Hodgkin's lymphoma treated with radiation and chemotherapy to the chest area and has been in remission since 2011. She does have pulmonary nodules which are being followed regularly every 6 months. She is a former smoker. Stress echocardiogram in 02/2012 showed evidence of severe anterior wall ischemia. She underwent cardiac catheterization  which showed a 95% stenosis in the proximal LAD as well as 90% ostial first diagonal stenosis. This was a bifurcation stenosis. She underwent a complex PCI with balloon angioplasty of first diagonal  as well as a drug-eluting stent placement to the proximal LAD. She had recurrent intermittent chest pain after that. A treadmill nuclear stress test in September of 2014 showed a fixed anterior wall defect with minor reversibility. Ejection fraction was normal. She was seen by Dr. Lacinda Axon in July for episodes of dizziness and presyncope. She was under stress at that time. She was referred back to [place] for evaluation of presyncope. However, she reports that her symptoms have resolved completely with no recurrent episodes. Most recently, she been dealing with severe dry cough especially at night. This has been determined to be due to GERD. She started taking Nexium with improvement. She denies any chest pain or shortness of breath.   Past Medical History:  Diagnosis Date  . Cervicalgia   . Coronary artery disease 02/2012   Abnormal stress test with anterior wall ischemia. Cardiac catheterization showed a 95% stenosis in the proximal LAD bifurcating  with a 90% stenosis in first diagonal. Ejection fraction was 45% with anterior wall hypokinesis. She underwent balloon angioplasty to ostial first diagonal and drug-eluting stent placement to proximal LAD with a 3.0 x 15 mm Xience EX drug-eluting stent  . Essential hypertension, benign   . Fibrocystic breast disease   . Gestational hypertension   . Heart disease   . Heart murmur   . History of anemia   . History of blood transfusion   . Hodgkin's lymphoma ([place]) 2011   s/p radiation and chemo therapy  . Osteoarthritis   . Polycystic ovarian disease     Past Surgical History:  Procedure Laterality Date  . bladder sling    . CARDIAC CATHETERIZATION  02/2012   [facility]  . CERVICAL POLYPECTOMY    . CHOLECYSTECTOMY  1982  . COLONOSCOPY WITH PROPOFOL N/A 02/05/2015   Procedure: COLONOSCOPY WITH PROPOFOL;  Surgeon: Lucilla Lame, MD;  Location: [facility] ENDOSCOPY;  Service: Endoscopy;  Laterality: N/A;  . CORONARY ANGIOPLASTY  02/2012   left/right s/p balloon  . heart stent'  2013  . transobturator sling N/A 2009   [place]     Current Outpatient Prescriptions  Medication Sig Dispense Refill  . aspirin 81 MG tablet Take 81 mg by mouth daily.    Marland Kitchen atorvastatin (LIPITOR) 10 MG tablet TAKE ONE TABLET BY MOUTH ONCE DAILY 90 tablet 3  . BIOTIN PO Take by mouth daily. Reported on 11/22/2015    . Cholecalciferol (VITAMIN D) 2000 UNITS tablet Take 2,000 Units by mouth. Reported on 12/18/2015    .  diazepam (VALIUM) 5 MG tablet Take 1 tablet (5 mg total) by mouth every 12 (twelve) hours as needed for anxiety. 30 tablet 1  . docusate (COLACE) 50 MG/5ML liquid 2-3 drops in each ear nightly 100 mL 0  . metoprolol tartrate (LOPRESSOR) 25 MG tablet TAKE ONE TABLET BY MOUTH TWICE DAILY 180 tablet 3  . nitroGLYCERIN (NITROSTAT) 0.4 MG SL tablet Place 1 tablet (0.4 mg total) under the tongue every 5 (five) minutes as needed for chest pain. 25 tablet 3  . pyridOXINE (VITAMIN B-6) 100 MG tablet Take 100 mg by mouth 3  (three) times a week. Reported on 11/22/2015    . TURMERIC PO Take by mouth.    . Dexlansoprazole (DEXILANT) 30 MG capsule Take 1 capsule (30 mg total) by mouth daily. (Patient not taking: Reported on 02/04/2016) 30 capsule 10   No current facility-administered medications for this visit.     Allergies:   Sulfa antibiotics; Z-pak [azithromycin]; and Antifungal [miconazole nitrate]    Social History:  The patient  reports that she has quit smoking. Her smoking use included Cigarettes. She has a 30.00 pack-year smoking history. She has never used smokeless tobacco. She reports that she drinks alcohol. She reports that she does not use drugs.   Family History:  The patient's family history includes ALS in her father; Breast cancer in her maternal aunt; Cancer in her maternal aunt and maternal grandfather; Diabetes in her brother; Polymyositis in her father; Stroke in her maternal grandfather and maternal grandmother.    ROS:  Please see the history of present illness.   Otherwise, review of systems are positive for none.   All other systems are reviewed and negative.    PHYSICAL EXAM: VS:  BP 120/60 (BP Location: Right Arm, Patient Position: Sitting, Cuff Size: Large)   Pulse 69   Ht 5\' 2"  (1.575 m)   Wt 183 lb 12.8 oz (83.4 kg)   SpO2 96%   BMI 33.62 kg/m  , BMI Body mass index is 33.62 kg/m. GEN: Well nourished, well developed, in no acute distress  HEENT: normal  Neck: no JVD, carotid bruits, or masses Cardiac: RRR; no murmurs, rubs, or gallops,no edema  Respiratory:  clear to auscultation bilaterally, normal work of breathing GI: soft, nontender, nondistended, + BS MS: no deformity or atrophy  Skin: warm and dry, no rash Neuro:  Strength and sensation are intact Psych: euthymic mood, full affect   EKG:  EKG is ordered today. The ekg ordered today demonstrates normal sinus rhythm with no significant ST or T wave changes.   Recent Labs: 11/20/2015: ALT 14; BUN 18; Creatinine,  Ser 1.07; Potassium 4.7; Sodium 140; TSH 2.84 12/18/2015: Hemoglobin 14.4; Platelets 230.0    Lipid Panel    Component Value Date/Time   CHOL 92 11/20/2015 1154   CHOL 110 04/11/2012 0835   TRIG 99.0 11/20/2015 1154   HDL 40.80 11/20/2015 1154   HDL 56 04/11/2012 0835   CHOLHDL 2 11/20/2015 1154   VLDL 19.8 11/20/2015 1154   LDLCALC 31 11/20/2015 1154   LDLCALC 41 04/11/2012 0835      Wt Readings from Last 3 Encounters:  02/04/16 183 lb 12.8 oz (83.4 kg)  02/03/16 185 lb 12.8 oz (84.3 kg)  01/15/16 183 lb 8 oz (83.2 kg)        ASSESSMENT AND PLAN:  1.  Coronary artery disease involving native coronary arteries without angina. She is overall doing well with no anginal symptoms. Continue medical therapy.  2. Dizziness: This happened in July with no recurrent episodes. Her cardiac exam is unremarkable and ECG is normal. Her ejection fraction is known to be normal and thus she is at low risk for malignant arrhythmia. No further workup given that her symptoms resolved.  3. Hyperlipidemia: Continue treatment with low dose atorvastatin. Most recent LDL was 31.     Disposition:   FU with me in 1 year  Signed,  [name], MD  02/04/2016 3:31 PM    [place]

## 2016-02-04 NOTE — Patient Instructions (Signed)
Medication Instructions: Continue same medications.   Labwork: None.   Procedures/Testing: None.   Follow-Up: 1 year follow up with Dr. Arida.   Any Additional Special Instructions Will Be Listed Below (If Applicable).     If you need a refill on your cardiac medications before your next appointment, please call your pharmacy.   

## 2016-02-05 ENCOUNTER — Telehealth: Payer: Self-pay | Admitting: *Deleted

## 2016-02-05 NOTE — Telephone Encounter (Signed)
You stated you wanted pt to continue Nambe. Insurance is requiring a PA to be done. Would you like for me to proceed with PA or change medication? Thanks.

## 2016-02-05 NOTE — Progress Notes (Signed)
PULMONARY CONSULT NOTE  Requesting MD/Service: Derrel Nip Date of initial consultation: 02/03/16 Reason for consultation: Chronic cough  PT PROFILE: 23 F referred for evaluation of chronic cough and hoarseness. Initial impression: LPR. Treated with Nexium  HPI:  84 F referred for evaluation of cough since Nov 2016. In retrospect, she indicates that she has had cough "off and on" for many years. Her cough is productive of scant white mucus. She also reports occasional vague chest discomfort. Se denies DOE. She has been treated with antibiotics with minimal short lived improvement. She indicates that the cough feels like it is "in her throat". She also reports a feeling of a lump in her throat and hoarseness. She has occasional sinus congestion and posterior nasal drainage. Denies fever, purulent sputum, hemoptysis, LE edema and calf tenderness.  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 A recent CT chest revealed no new changes   Past Medical History:  Diagnosis Date  . Cervicalgia   . Coronary artery disease 02/2012   Abnormal stress test with anterior wall ischemia. Cardiac catheterization showed a 95% stenosis in the proximal LAD bifurcating with a 90% stenosis in first diagonal. Ejection fraction was 45% with anterior wall hypokinesis. She underwent balloon angioplasty to ostial first diagonal and drug-eluting stent placement to proximal LAD with a 3.0 x 15 mm Xience EX drug-eluting stent  . Essential hypertension, benign   . Fibrocystic breast disease   . Gestational hypertension   . Heart disease   . Heart murmur   . History of anemia   . History of blood transfusion   . Hodgkin's lymphoma ([place]) 2011   s/p radiation and chemo therapy  . Osteoarthritis   . Polycystic ovarian disease     Past Surgical History:  Procedure Laterality Date  . bladder sling    . CARDIAC CATHETERIZATION  02/2012   [facility]  . CERVICAL  POLYPECTOMY    . CHOLECYSTECTOMY  1982  . COLONOSCOPY WITH PROPOFOL N/A 02/05/2015   Procedure: COLONOSCOPY WITH PROPOFOL;  Surgeon: Lucilla Lame, MD;  Location: [facility] ENDOSCOPY;  Service: Endoscopy;  Laterality: N/A;  . CORONARY ANGIOPLASTY  02/2012   left/right s/p balloon  . heart stent'  2013  . transobturator sling N/A 2009   [place]    MEDICATIONS: I have reviewed all medications and confirmed regimen as documented  Social History   Social History  . Marital status: Married    Spouse name: N/A  . Number of children: N/A  . Years of education: N/A   Occupational History  . Not on file.   Social History Main Topics  . Smoking status: Former Smoker    Packs/day: 1.00    Years: 30.00    Types: Cigarettes  . Smokeless tobacco: Never Used     Comment: quit smoking in 2000  . Alcohol use Yes     Comment: occasional  . Drug use: No  . Sexual activity: No   Other Topics Concern  . Not on file   Social History Narrative  . No narrative on file    Family History  Problem Relation Age of Onset  . ALS Father   . Polymyositis Father   . Diabetes Brother   . Cancer Maternal Aunt     breast  . Breast cancer Maternal Aunt     30's  . Stroke Maternal Grandmother   . Cancer Maternal Grandfather     prostate  . Stroke Maternal Grandfather  ROS: No fever, myalgias/arthralgias, unexplained weight loss or weight gain No new focal weakness or sensory deficits No otalgia, hearing loss, visual changes, nasal and sinus symptoms, mouth and throat problems No neck pain or adenopathy No abdominal pain, N/V/D, diarrhea, change in bowel pattern No dysuria, change in urinary pattern   Vitals:   02/03/16 1354  BP: 130/62  Pulse: 78  SpO2: 97%  Weight: 185 lb 12.8 oz (84.3 kg)  Height: 5\' 2"  (1.575 m)     EXAM:  Gen: WDWN, No overt respiratory distress HEENT: NCAT, sclera white, oropharynx normal Neck: Supple without LAN, thyromegaly, JVD Lungs: breath sounds:  full, percussion: normal, faint crackles on R anteriorly Cardiovascular: RRR, no murmurs noted Abdomen: Soft, nontender, normal BS Ext: without clubbing, cyanosis, edema Neuro: CNs grossly intact, motor and sensory intact Skin: Limited exam, no lesions noted  DATA:   BMP Latest Ref Rng & Units 11/20/2015 04/29/2015 04/11/2015  Glucose 70 - 99 mg/dL 98 81 -  BUN 6 - 23 mg/dL 18 22 -  Creatinine 0.40 - 1.20 mg/dL 1.07 1.13 1.00  BUN/Creat Ratio 11 - 26 - - -  Sodium 135 - 145 mEq/L 140 141 -  Potassium 3.5 - 5.1 mEq/L 4.7 4.7 -  Chloride 96 - 112 mEq/L 106 105 -  CO2 19 - 32 mEq/L 28 26 -  Calcium 8.4 - 10.5 mg/dL 9.3 9.8 -    CBC Latest Ref Rng & Units 12/18/2015 04/11/2015 10/24/2014  WBC 4.0 - 10.5 K/uL 8.6 8.1 7.1  Hemoglobin 12.0 - 15.0 g/dL 14.4 13.8 14.6  Hematocrit 36.0 - 46.0 % 43.5 41.5 43.9  Platelets 150.0 - 400.0 K/uL 230.0 228 234.0    CT chest 07/11`/17: R>L UL medial fibrotic changes. Similar changes in BLL but to lesser extent. [place] since 2012    IMPRESSION:     ICD-9-CM ICD-10-CM   1. Chronic cough 786.2 R05   2. Laryngopharyngeal reflux (LPR) 478.79 J38.7   3. Chronic radiation fibrosis Description is fairly classic for LPR (hoarseness, globus sensation). She does have chronic radiation changes but this does not seem to be an active or acute process and she has failed to respond to trial of antibiotics  PLAN:  1) Dexilant 30 mg daily 2) May stop Spiriva as it has not helped 2) ROV 4-6 weeks. Consider empiric therapy for rhinosinusitis if control is not complete   Merton Border, MD PCCM service Mobile [phone] Pager [phone] 02/05/2016

## 2016-02-06 MED ORDER — LANSOPRAZOLE 30 MG PO CPDR: 30.0000 mg | DELAYED_RELEASE_CAPSULE | Freq: Every day | ORAL | 10 refills | Status: DC

## 2016-02-06 NOTE — Addendum Note (Signed)
Addended by: Merton Border B on: 02/06/2016 09:26 AM   Modules accepted: Orders

## 2016-02-06 NOTE — Telephone Encounter (Signed)
Spoke with pt in regards to med change. States she picked up Nexium OTC. Informed pt if that works then we can send in [place] for the Nexium. Nothing further needed.

## 2016-02-06 NOTE — Telephone Encounter (Signed)
LMOVM for pt to call back in regards to [place] being changed to Prevacid per DS. Will await call back to inform medication has been sent.

## 2016-02-06 NOTE — Telephone Encounter (Signed)
Noted. Thanks.

## 2016-03-03 ENCOUNTER — Encounter: Payer: Self-pay | Admitting: Internal Medicine

## 2016-03-03 MED ORDER — DIAZEPAM 5 MG PO TABS: 5.0000 mg | ORAL_TABLET | Freq: Two times a day (BID) | ORAL | 1 refills | Status: DC | PRN

## 2016-03-03 NOTE — Telephone Encounter (Signed)
Last OV 8/17 last refill on Valium was 5/17 30 with 1 refill. Ok to fill?

## 2016-03-03 NOTE — Telephone Encounter (Signed)
Ok to call in refill per chart

## 2016-03-04 NOTE — Telephone Encounter (Signed)
Refill phoned to pharmacy.

## 2016-03-13 ENCOUNTER — Ambulatory Visit: Payer: PPO | Admitting: Pulmonary Disease

## 2016-03-19 ENCOUNTER — Ambulatory Visit: Payer: PPO | Admitting: Pulmonary Disease

## 2016-03-24 ENCOUNTER — Ambulatory Visit: Payer: PPO | Admitting: Pulmonary Disease

## 2016-03-26 DIAGNOSIS — H5203 Hypermetropia, bilateral: Secondary | ICD-10-CM | POA: Diagnosis not present

## 2016-03-26 DIAGNOSIS — H43393 Other vitreous opacities, bilateral: Secondary | ICD-10-CM | POA: Diagnosis not present

## 2016-03-26 DIAGNOSIS — H2510 Age-related nuclear cataract, unspecified eye: Secondary | ICD-10-CM | POA: Diagnosis not present

## 2016-03-26 DIAGNOSIS — H43313 Vitreous membranes and strands, bilateral: Secondary | ICD-10-CM | POA: Diagnosis not present

## 2016-04-19 ENCOUNTER — Other Ambulatory Visit: Payer: Self-pay | Admitting: Cardiovascular Disease

## 2016-05-26 ENCOUNTER — Ambulatory Visit (INDEPENDENT_AMBULATORY_CARE_PROVIDER_SITE_OTHER): Payer: PPO | Admitting: Internal Medicine

## 2016-05-26 ENCOUNTER — Encounter: Payer: Self-pay | Admitting: Internal Medicine

## 2016-05-26 VITALS — BP 116/74 | HR 102 | Temp 97.8°F | Resp 12 | Ht 62.0 in | Wt 174.5 lb

## 2016-05-26 DIAGNOSIS — Z6831 Body mass index (BMI) 31.0-31.9, adult: Secondary | ICD-10-CM

## 2016-05-26 DIAGNOSIS — Z634 Disappearance and death of family member: Secondary | ICD-10-CM

## 2016-05-26 DIAGNOSIS — E669 Obesity, unspecified: Secondary | ICD-10-CM

## 2016-05-26 DIAGNOSIS — G4762 Sleep related leg cramps: Secondary | ICD-10-CM

## 2016-05-26 DIAGNOSIS — Z0000 Encounter for general adult medical examination without abnormal findings: Secondary | ICD-10-CM

## 2016-05-26 DIAGNOSIS — R600 Localized edema: Secondary | ICD-10-CM | POA: Diagnosis not present

## 2016-05-26 DIAGNOSIS — E6609 Other obesity due to excess calories: Secondary | ICD-10-CM

## 2016-05-26 DIAGNOSIS — E7800 Pure hypercholesterolemia, unspecified: Secondary | ICD-10-CM

## 2016-05-26 DIAGNOSIS — F4321 Adjustment disorder with depressed mood: Secondary | ICD-10-CM

## 2016-05-26 DIAGNOSIS — F4329 Adjustment disorder with other symptoms: Secondary | ICD-10-CM

## 2016-05-26 DIAGNOSIS — F4381 Prolonged grief disorder: Secondary | ICD-10-CM

## 2016-05-26 NOTE — Progress Notes (Signed)
Subjective:  Patient ID: Jaime Hall, female    DOB: [birth year]  Age: 65 y.o. MRN: [id number]  CC: The primary encounter diagnosis was Pure hypercholesterolemia. Diagnoses of Edema of both legs, Class 1 obesity due to excess calories without serious comorbidity with body mass index (BMI) of 31.0 to 31.9 in adult, Nocturnal leg cramps, Complicated grief, Routine general medical examination at a health care facility, and Obesity (BMI 30-39.9) were also pertinent to this visit.  HPI Jaime Hall presents for annual preventive exam      Sees Dr Garwin Brothers  Last year due to history of polyp.  Benign  Had mammogram Still grieving the unexpected loss of her husband.  Refused flu vaccine  Weight loss of 10 lbs   Nocturnal leg cramps.  Left  Leg more frequent than right. Occurs lying down in bed,   Denies back pain .  Occurs with stretching.Fluid retention for the last several months.       Outpatient Medications Prior to Visit  Medication Sig Dispense Refill  . aspirin 81 MG tablet Take 81 mg by mouth daily.    Marland Kitchen atorvastatin (LIPITOR) 10 MG tablet TAKE ONE TABLET BY MOUTH ONCE DAILY 90 tablet 3  . atorvastatin (LIPITOR) 10 MG tablet TAKE ONE TABLET BY MOUTH ONCE DAILY 90 tablet 3  . BIOTIN PO Take by mouth daily. Reported on 11/22/2015    . Cholecalciferol (VITAMIN D) 2000 UNITS tablet Take 2,000 Units by mouth. Reported on 12/18/2015    . diazepam (VALIUM) 5 MG tablet Take 1 tablet (5 mg total) by mouth every 12 (twelve) hours as needed for anxiety. 30 tablet 1  . docusate (COLACE) 50 MG/5ML liquid 2-3 drops in each ear nightly 100 mL 0  . metoprolol tartrate (LOPRESSOR) 25 MG tablet TAKE ONE TABLET BY MOUTH TWICE DAILY 180 tablet 3  . nitroGLYCERIN (NITROSTAT) 0.4 MG SL tablet Place 1 tablet (0.4 mg total) under the tongue every 5 (five) minutes as needed for chest pain. 25 tablet 3  . pyridOXINE (VITAMIN B-6) 100 MG tablet Take 100 mg by mouth 3 (three) times a week. Reported on  11/22/2015    . TURMERIC PO Take by mouth.    . lansoprazole (PREVACID) 30 MG capsule Take 1 capsule (30 mg total) by mouth daily at 12 noon. (Patient not taking: Reported on 05/26/2016) 30 capsule 10   No facility-administered medications prior to visit.     Review of Systems;  Patient denies headache, fevers, malaise, unintentional weight loss, skin rash, eye pain, sinus congestion and sinus pain, sore throat, dysphagia,  hemoptysis , cough, dyspnea, wheezing, chest pain, palpitations, orthopnea, edema, abdominal pain, nausea, melena, diarrhea, constipation, flank pain, dysuria, hematuria, urinary  Frequency, nocturia, numbness, tingling, seizures,  Focal weakness, Loss of consciousness,  Tremor, insomnia, depression, , and suicidal ideation.      Objective:  BP 116/74   Pulse (!) 102   Temp 97.8 F (36.6 C) (Oral)   Resp 12   Ht 5\' 2"  (1.575 m)   Wt 174 lb 8 oz (79.2 kg)   SpO2 96%   BMI 31.92 kg/m   BP Readings from Last 3 Encounters:  05/26/16 116/74  02/04/16 120/60  02/03/16 130/62    Wt Readings from Last 3 Encounters:  05/26/16 174 lb 8 oz (79.2 kg)  02/04/16 183 lb 12.8 oz (83.4 kg)  02/03/16 185 lb 12.8 oz (84.3 kg)   General appearance: alert, cooperative and appears stated age Head: Normocephalic, without  obvious abnormality, atraumatic Eyes: conjunctivae/corneas clear. PERRL, EOM's intact. Fundi benign. Ears: normal TM's and external ear canals both ears Nose: Nares normal. Septum midline. Mucosa normal. No drainage or sinus tenderness. Throat: lips, mucosa, and tongue normal; teeth and gums normal Neck: no adenopathy, no carotid bruit, no JVD, supple, symmetrical, trachea midline and thyroid not enlarged, symmetric, no tenderness/mass/nodules Lungs: clear to auscultation bilaterally Breasts: pendulous breasts, no masses or tenderness Heart: regular rate and rhythm, S1, S2 normal, no murmur, click, rub or gallop Abdomen: soft, non-tender; bowel sounds normal;  no masses,  no organomegaly Extremities: extremities normal, atraumatic, no cyanosis or edema Pulses: 2+ and symmetric Skin: Skin color, texture, turgor normal. No rashes or lesions Neurologic: Alert and oriented X 3, normal strength and tone. Normal symmetric reflexes. Normal coordination and gait.     Lab Results  Component Value Date   HGBA1C 5.8 05/26/2016    Lab Results  Component Value Date   CREATININE 1.17 05/26/2016   CREATININE 1.07 11/20/2015   CREATININE 1.13 04/29/2015    Lab Results  Component Value Date   WBC 8.6 12/18/2015   HGB 14.4 12/18/2015   HCT 43.5 12/18/2015   PLT 230.0 12/18/2015   GLUCOSE 83 05/26/2016   CHOL 131 05/26/2016   TRIG 134.0 05/26/2016   HDL 53.90 05/26/2016   LDLCALC 51 05/26/2016   ALT 21 05/26/2016   AST 26 05/26/2016   NA 141 05/26/2016   K 4.6 05/26/2016   CL 106 05/26/2016   CREATININE 1.17 05/26/2016   BUN 25 (H) 05/26/2016   CO2 29 05/26/2016   TSH 3.92 05/26/2016   INR 1.0 02/29/2012   HGBA1C 5.8 05/26/2016   MICROALBUR 0.8 09/23/2012    Mammogram Digital Screening  Result Date: 12/27/2015 CLINICAL DATA:  Screening. EXAM: DIGITAL SCREENING BILATERAL MAMMOGRAM WITH CAD COMPARISON:  Previous exam(s). ACR Breast Density Category c: The breast tissue is heterogeneously dense, which may obscure small masses. FINDINGS: There are no findings suspicious for malignancy. Images were processed with CAD. IMPRESSION: No mammographic evidence of malignancy. A result letter of this screening mammogram will be mailed directly to the patient. RECOMMENDATION: Screening mammogram in one year. (Code:SM-B-01Y) BI-RADS CATEGORY  1: Negative. Electronically Signed   By: Ammie Ferrier M.D.   On: 12/27/2015 08:17    Assessment & Plan:   Problem List Items Addressed This Visit    Class 1 obesity due to excess calories without serious comorbidity with body mass index (BMI) of 34.0 to [id number] in adult   Complicated grief    Secondary to loss of  husband 7 months ago. She has benefited from grief counselling  but states that her friends have not observed any benefit to her.  She is getting out of the house and being more active,  She remains tearful at times      Edema of both legs    Likely VI due to prolonged standing.  left leg > right . Referral to vascular surgery for evaluation       Relevant Orders   Ambulatory referral to Vascular Surgery   Comprehensive metabolic panel (Completed)   TSH (Completed)   Brain natriuretic peptide (Completed)   Hyperlipidemia - Primary   Relevant Orders   Lipid panel (Completed)   Nocturnal leg cramps    Chronic, advised to increase hydration,  Try soaking in epsom salts in bath followed by stretching       Obesity (BMI 30-39.9)    I have congratulated her in reduction  of   BMI and encouraged  Continued weight loss with goal of 10% of body weigh over the next 6 months using a low glycemic index diet and regular exercise a minimum of 5 days per week.        Routine general medical examination at a health care facility    Annual comprehensive preventive exam was done as well as an evaluation and management of chronic conditions .  During the course of the visit the patient was educated and counseled about appropriate screening and preventive services including :  diabetes screening, lipid analysis with projected  10 year  risk for CAD , nutrition counseling, breast, cervical and colorectal cancer screening, and recommended immunizations.  Printed recommendations for health maintenance screenings was given        A total of 25 minutes of face to face time was spent with patient more than half of which was spent in counselling about the above mentioned conditions  and coordination of care  I am having Ms. Grochowski maintain her aspirin, pyridOXINE, BIOTIN PO, nitroGLYCERIN, atorvastatin, TURMERIC PO, docusate, Vitamin D, metoprolol tartrate, lansoprazole, diazepam, atorvastatin, Vitamin B-12, and  esomeprazole.  Meds ordered this encounter  Medications  . Cyanocobalamin (VITAMIN B-12) 1000 MCG SUBL    Sig: Place 1 tablet under the tongue daily.  Marland Kitchen esomeprazole (NEXIUM) 20 MG capsule    Sig: Take 20 mg by mouth daily at 12 noon.    There are no discontinued medications.  Follow-up: No Follow-up on file.   [name], MD

## 2016-05-26 NOTE — Assessment & Plan Note (Addendum)
Likely VI due to prolonged standing.  left leg > right . Referral to vascular surgery for evaluation

## 2016-05-26 NOTE — Patient Instructions (Addendum)
Leg cramps occur when muscles are tired or DEHYDRATED  Try soaking legs in a hot bath with epsom salts before bed. .  Then stretch your legs before you get under the covers.   Your leg swelling may be due to "venous insufficiency" which is aggravated by prolonged standing  I am sending you to vascular surgery to have your veins examined   Look on the website [email or web address]  For compression knee highs.

## 2016-05-26 NOTE — Progress Notes (Signed)
Pre-visit discussion using our clinic review tool. No additional management support is needed unless otherwise documented below in the visit note.  

## 2016-05-27 DIAGNOSIS — Z6834 Body mass index (BMI) 34.0-34.9, adult: Secondary | ICD-10-CM

## 2016-05-27 DIAGNOSIS — R252 Cramp and spasm: Secondary | ICD-10-CM | POA: Insufficient documentation

## 2016-05-27 DIAGNOSIS — E6609 Other obesity due to excess calories: Secondary | ICD-10-CM | POA: Insufficient documentation

## 2016-05-27 LAB — COMPREHENSIVE METABOLIC PANEL
ALT: 21 U/L (ref 0–35)
AST: 26 U/L (ref 0–37)
Albumin: 4.5 g/dL (ref 3.5–5.2)
Alkaline Phosphatase: 78 U/L (ref 39–117)
BUN: 25 mg/dL — ABNORMAL HIGH (ref 6–23)
CO2: 29 mEq/L (ref 19–32)
Calcium: 9.7 mg/dL (ref 8.4–10.5)
Chloride: 106 mEq/L (ref 96–112)
Creatinine, Ser: 1.17 mg/dL (ref 0.40–1.20)
GFR: 49.09 mL/min — ABNORMAL LOW (ref >60.00)
Glucose, Bld: 83 mg/dL (ref 70–99)
Potassium: 4.6 mEq/L (ref 3.5–5.1)
Sodium: 141 mEq/L (ref 135–145)
Total Bilirubin: 1 mg/dL (ref 0.2–1.2)
Total Protein: 7.1 g/dL (ref 6.0–8.3)

## 2016-05-27 LAB — HEMOGLOBIN A1C: Hgb A1c MFr Bld: 5.8 % (ref 4.6–6.5)

## 2016-05-27 LAB — BRAIN NATRIURETIC PEPTIDE: Brain Natriuretic Peptide: 124.8 pg/mL — ABNORMAL HIGH (ref <100)

## 2016-05-27 LAB — LIPID PANEL
Cholesterol: 131 mg/dL (ref 0–200)
HDL: 53.9 mg/dL (ref >39.00)
LDL Cholesterol: 51 mg/dL (ref 0–99)
NonHDL: 77.42
Total CHOL/HDL Ratio: 2
Triglycerides: 134 mg/dL (ref 0.0–149.0)
VLDL: 26.8 mg/dL (ref 0.0–40.0)

## 2016-05-27 LAB — TSH: TSH: 3.92 u[IU]/mL (ref 0.35–4.50)

## 2016-05-27 NOTE — Assessment & Plan Note (Signed)
Annual comprehensive preventive exam was done as well as an evaluation and management of chronic conditions .  During the course of the visit the patient was educated and counseled about appropriate screening and preventive services including :  diabetes screening, lipid analysis with projected  10 year  risk for CAD , nutrition counseling, breast, cervical and colorectal cancer screening, and recommended immunizations.  Printed recommendations for health maintenance screenings was given 

## 2016-05-27 NOTE — Assessment & Plan Note (Signed)
Chronic, advised to increase hydration,  Try soaking in epsom salts in bath followed by stretching

## 2016-05-27 NOTE — Assessment & Plan Note (Addendum)
Secondary to loss of husband 7 months ago. She has benefited from grief counselling  but states that her friends have not observed any benefit to her.  She is getting out of the house and being more active,  She remains tearful at times

## 2016-05-27 NOTE — Assessment & Plan Note (Signed)
I have congratulated her in reduction of   BMI and encouraged  Continued weight loss with goal of 10% of body weigh over the next 6 months using a low glycemic index diet and regular exercise a minimum of 5 days per week.    

## 2016-05-30 ENCOUNTER — Other Ambulatory Visit: Payer: Self-pay | Admitting: Internal Medicine

## 2016-05-30 ENCOUNTER — Encounter: Payer: Self-pay | Admitting: Internal Medicine

## 2016-06-10 ENCOUNTER — Other Ambulatory Visit: Payer: Self-pay | Admitting: Cardiovascular Disease

## 2016-06-15 HISTORY — PX: OTHER SURGICAL HISTORY: SHX169

## 2016-07-06 ENCOUNTER — Telehealth: Payer: Self-pay | Admitting: Internal Medicine

## 2016-07-06 DIAGNOSIS — R3129 Other microscopic hematuria: Secondary | ICD-10-CM

## 2016-07-06 NOTE — Telephone Encounter (Signed)
Pt called and stated that she has been spotting for about a week, and wants to know if she come in for an appt. Please advise, thank you!  Call pt @ [phone]

## 2016-07-06 NOTE — Telephone Encounter (Signed)
Spotting for the last week Dark in color in pad but when patient wipes has pink discharge vaginal. Patient has has a bladder sling, wondering if this could be causing the bleeding? Patient said that not as bad today because she has not been on her feet,  But is heavier days she has been active. Scheduled patient for 07/13/16 at 5 because patient cannot do a morning appointment is this time ok?

## 2016-07-06 NOTE — Telephone Encounter (Signed)
Yes, but if she would like to give [place] a urine specimen  before that , order a POCt urine,  UA with micro and urine culture

## 2016-07-07 ENCOUNTER — Other Ambulatory Visit (INDEPENDENT_AMBULATORY_CARE_PROVIDER_SITE_OTHER): Payer: PPO

## 2016-07-07 DIAGNOSIS — R3129 Other microscopic hematuria: Secondary | ICD-10-CM | POA: Diagnosis not present

## 2016-07-07 LAB — URINALYSIS, ROUTINE W REFLEX MICROSCOPIC
Bilirubin Urine: NEGATIVE
Ketones, ur: NEGATIVE
Nitrite: NEGATIVE
Specific Gravity, Urine: 1.025 (ref 1.000–1.030)
Total Protein, Urine: NEGATIVE
Urine Glucose: NEGATIVE
Urobilinogen, UA: 0.2 (ref 0.0–1.0)
pH: 5.5 (ref 5.0–8.0)

## 2016-07-07 LAB — POCT URINALYSIS DIPSTICK
Bilirubin, UA: NEGATIVE
Glucose, UA: NEGATIVE
Ketones, UA: NEGATIVE
Nitrite, UA: NEGATIVE
Protein, UA: 30
Spec Grav, UA: 1.025
Urobilinogen, UA: 0.2
pH, UA: 5.5

## 2016-07-07 NOTE — Telephone Encounter (Signed)
Patient scheduled for lab and orders in.

## 2016-07-08 LAB — URINE CULTURE

## 2016-07-09 ENCOUNTER — Encounter: Payer: Self-pay | Admitting: Internal Medicine

## 2016-07-13 ENCOUNTER — Ambulatory Visit (INDEPENDENT_AMBULATORY_CARE_PROVIDER_SITE_OTHER): Payer: PPO | Admitting: Internal Medicine

## 2016-07-13 ENCOUNTER — Other Ambulatory Visit (HOSPITAL_COMMUNITY)
Admission: RE | Admit: 2016-07-13 | Discharge: 2016-07-13 | Disposition: A | Discharge status: Home / Self Care | Payer: PPO | Source: Ambulatory Visit | Attending: Internal Medicine | Admitting: Internal Medicine

## 2016-07-13 ENCOUNTER — Encounter: Payer: Self-pay | Admitting: Internal Medicine

## 2016-07-13 VITALS — BP 110/60 | HR 85 | Temp 98.8°F | Resp 16 | Wt 175.0 lb

## 2016-07-13 DIAGNOSIS — N950 Postmenopausal bleeding: Secondary | ICD-10-CM

## 2016-07-13 DIAGNOSIS — J111 Influenza due to unidentified influenza virus with other respiratory manifestations: Secondary | ICD-10-CM

## 2016-07-13 DIAGNOSIS — Z1151 Encounter for screening for human papillomavirus (HPV): Secondary | ICD-10-CM | POA: Diagnosis not present

## 2016-07-13 DIAGNOSIS — Z79899 Other long term (current) drug therapy: Secondary | ICD-10-CM

## 2016-07-13 DIAGNOSIS — Z01419 Encounter for gynecological examination (general) (routine) without abnormal findings: Secondary | ICD-10-CM | POA: Insufficient documentation

## 2016-07-13 NOTE — Progress Notes (Signed)
Subjective:  Patient ID: Jaime Hall, female    DOB: 06/20/1950  Age: 66 y.o. MRN: [id number]  CC: The primary encounter diagnosis was Postmenopausal bleeding. Diagnoses of Long-term use of high-risk medication and Influenza were also pertinent to this visit.  HPI [name] presents for evaluation of vaginal bleeding . Patient first noticed some spotting 2 weeks ago, not during urination.  Spotting has progressed somewhat to scant flow reminiscent of a light period and has been wearing sanitary pads.   She is not sexually active and does not take HRT.  Denies abdominal plain,  bleeding form other mucosa.     Currently being treated for the flu by urgent care  Since  Saturday for symptoms that started 2 days prior which included sore throat and cough, feves and body aches.  rapid strep was neg  Using honey and phenergan/DM for cough.  Taking tylenol for body aches,  Has not had a fever since Thursday  Work note needed     Outpatient Medications Prior to Visit  Medication Sig Dispense Refill  . aspirin 81 MG tablet Take 81 mg by mouth daily.    Marland Kitchen atorvastatin (LIPITOR) 10 MG tablet TAKE ONE TABLET BY MOUTH ONCE DAILY 90 tablet 3  . atorvastatin (LIPITOR) 10 MG tablet TAKE ONE TABLET BY MOUTH ONCE DAILY 90 tablet 3  . BIOTIN PO Take by mouth daily. Reported on 11/22/2015    . Cholecalciferol (VITAMIN D) 2000 UNITS tablet Take 2,000 Units by mouth. Reported on 12/18/2015    . Cyanocobalamin (VITAMIN B-12) 1000 MCG SUBL Place 1 tablet under the tongue daily.    . diazepam (VALIUM) 5 MG tablet Take 1 tablet (5 mg total) by mouth every 12 (twelve) hours as needed for anxiety. 30 tablet 1  . docusate (COLACE) 50 MG/5ML liquid 2-3 drops in each ear nightly 100 mL 0  . esomeprazole (NEXIUM) 20 MG capsule Take 20 mg by mouth daily at 12 noon.    . lansoprazole (PREVACID) 30 MG capsule Take 1 capsule (30 mg total) by mouth daily at 12 noon. 30 capsule 10  . metoprolol tartrate (LOPRESSOR) 25  MG tablet TAKE ONE TABLET BY MOUTH TWICE DAILY 180 tablet 2  . nitroGLYCERIN (NITROSTAT) 0.4 MG SL tablet Place 1 tablet (0.4 mg total) under the tongue every 5 (five) minutes as needed for chest pain. 25 tablet 3  . pyridOXINE (VITAMIN B-6) 100 MG tablet Take 100 mg by mouth 3 (three) times a week. Reported on 11/22/2015    . TURMERIC PO Take by mouth.     No facility-administered medications prior to visit.     Review of Systems;  Patient denies , unintentional weight loss, skin rash, eye pain,sinus pain,t, dysphagia,  hemoptysis ,, dyspnea, wheezing, chest pain, palpitations, orthopnea, edema, abdominal pain, nausea, melena, diarrhea, constipation, flank pain, dysuria, hematuria, urinary  Frequency, nocturia, numbness, tingling, seizures,  Focal weakness, Loss of consciousness,  Tremor, insomnia, depression, anxiety, and suicidal ideation.      Objective:  BP 110/60   Pulse 85   Temp 98.8 F (37.1 C) (Oral)   Resp 16   Wt 175 lb (79.4 kg)   SpO2 96%   BMI 32.01 kg/m   BP Readings from Last 3 Encounters:  07/13/16 110/60  05/26/16 116/74  02/04/16 120/60    Wt Readings from Last 3 Encounters:  07/13/16 175 lb (79.4 kg)  05/26/16 174 lb 8 oz (79.2 kg)  02/04/16 183 lb 12.8 oz (83.4  kg)    General appearance: alert, cooperative and appears stated age General Appearance:    Alert, cooperative, no distress, appears stated age  Head:    Normocephalic, without obvious abnormality, atraumatic  Eyes:    PERRL, conjunctiva/corneas clear, EOM's intact, fundi    benign, both eyes  Ears:    Normal TM's and external ear canals, both ears  Nose:   Nares normal, septum midline, mucosa normal, no drainage    or sinus tenderness  Throat:   Lips, mucosa, and tongue normal; teeth and gums normal  Neck:   Supple, symmetrical, trachea midline, no adenopathy;    thyroid:  no enlargement/tenderness/nodules; no carotid   bruit or JVD  Back:     Symmetric, no curvature, ROM normal, no CVA  tenderness  Lungs:     Clear to auscultation bilaterally, respirations unlabored  Chest Wall:    No tenderness or deformity   Heart:    Regular rate and rhythm, S1 and S2 normal, no murmur, rub   or gallop  Breast Exam:    No tenderness, masses, or nipple abnormality  Abdomen:     Soft, non-tender, bowel sounds active all four quadrants,    no masses, no organomegaly  Genitalia:    Pelvic: cervix normal in appearance, external genitalia normal, no adnexal masses or tenderness, no cervical motion tenderness, rectovaginal septum normal, uterus normal size, shape, and consistency and vagina with fresh blood in vault without erythema or discharge  Extremities:   Extremities normal, atraumatic, no cyanosis or edema  Pulses:   2+ and symmetric all extremities  Skin:   Skin color, texture, turgor normal, no rashes or lesions  Lymph nodes:   Cervical, supraclavicular, and axillary nodes normal  Neurologic:   CNII-XII intact, normal strength, sensation and reflexes    throughout    Lab Results  Component Value Date   HGBA1C 5.8 05/26/2016    Lab Results  Component Value Date   CREATININE 1.17 05/26/2016   CREATININE 1.07 11/20/2015   CREATININE 1.13 04/29/2015    Lab Results  Component Value Date   WBC 8.6 12/18/2015   HGB 14.4 12/18/2015   HCT 43.5 12/18/2015   PLT 230.0 12/18/2015   GLUCOSE 83 05/26/2016   CHOL 131 05/26/2016   TRIG 134.0 05/26/2016   HDL 53.90 05/26/2016   LDLCALC 51 05/26/2016   ALT 21 05/26/2016   AST 26 05/26/2016   NA 141 05/26/2016   K 4.6 05/26/2016   CL 106 05/26/2016   CREATININE 1.17 05/26/2016   BUN 25 (H) 05/26/2016   CO2 29 05/26/2016   TSH 3.92 05/26/2016   INR 1.0 02/29/2012   HGBA1C 5.8 05/26/2016   MICROALBUR 0.8 09/23/2012   Assessment & Plan:   Problem List Items Addressed This Visit    Influenza    Diagnosed by urgent care.  Has been taking tamiflu since Saturday jan 27.  Feeling better and exam is normal. Advised to remain out of  work until afebrile off tylenol,  Work note given       Relevant Medications   oseltamivir (TAMIFLU) 75 MG capsule   Postmenopausal bleeding - Primary    PAP smear attempted today but may be inconclusive due to blood . Referral to Dr. Garwin Brothers for evaluation to rule out endometrial hyperplasia/CA.  coags and cbc ordered       Relevant Orders   Ambulatory referral to Gynecology   CBC with Differential/Platelet   Cytology - PAP   APTT  INR/PT    Other Visit Diagnoses    Long-term use of high-risk medication       Relevant Orders   Comprehensive metabolic panel      I am having Ms. Boyden maintain her aspirin, pyridOXINE, BIOTIN PO, nitroGLYCERIN, atorvastatin, TURMERIC PO, docusate, Vitamin D, lansoprazole, diazepam, atorvastatin, Vitamin B-12, esomeprazole, metoprolol tartrate, and oseltamivir.  Meds ordered this encounter  Medications  . oseltamivir (TAMIFLU) 75 MG capsule    Sig: Take 1 capsule by mouth 2 (two) times daily.    There are no discontinued medications.  Follow-up: No Follow-up on file.   [name], MD

## 2016-07-13 NOTE — Patient Instructions (Signed)
Referral to Dr Garwin Brothers is in process to evaluate your vaginal bleeding

## 2016-07-13 NOTE — Progress Notes (Signed)
Pre visit review using our clinic review tool, if applicable. No additional management support is needed unless otherwise documented below in the visit note. 

## 2016-07-14 ENCOUNTER — Emergency Department
Admission: EM | Admit: 2016-07-14 | Discharge: 2016-07-14 | Disposition: A | Discharge status: Home / Self Care | Payer: PPO | Attending: Emergency Medicine | Admitting: Emergency Medicine

## 2016-07-14 ENCOUNTER — Other Ambulatory Visit: Payer: Self-pay | Admitting: Obstetrics and Gynecology

## 2016-07-14 ENCOUNTER — Telehealth: Payer: Self-pay | Admitting: Internal Medicine

## 2016-07-14 ENCOUNTER — Emergency Department: Payer: PPO

## 2016-07-14 DIAGNOSIS — R938 Abnormal findings on diagnostic imaging of other specified body structures: Secondary | ICD-10-CM | POA: Insufficient documentation

## 2016-07-14 DIAGNOSIS — N950 Postmenopausal bleeding: Secondary | ICD-10-CM | POA: Diagnosis not present

## 2016-07-14 DIAGNOSIS — Z87891 Personal history of nicotine dependence: Secondary | ICD-10-CM | POA: Insufficient documentation

## 2016-07-14 DIAGNOSIS — I10 Essential (primary) hypertension: Secondary | ICD-10-CM | POA: Insufficient documentation

## 2016-07-14 DIAGNOSIS — J111 Influenza due to unidentified influenza virus with other respiratory manifestations: Secondary | ICD-10-CM | POA: Insufficient documentation

## 2016-07-14 DIAGNOSIS — Z79899 Other long term (current) drug therapy: Secondary | ICD-10-CM | POA: Diagnosis not present

## 2016-07-14 DIAGNOSIS — N939 Abnormal uterine and vaginal bleeding, unspecified: Secondary | ICD-10-CM | POA: Diagnosis not present

## 2016-07-14 DIAGNOSIS — I2510 Atherosclerotic heart disease of native coronary artery without angina pectoris: Secondary | ICD-10-CM | POA: Insufficient documentation

## 2016-07-14 DIAGNOSIS — Z7982 Long term (current) use of aspirin: Secondary | ICD-10-CM | POA: Insufficient documentation

## 2016-07-14 DIAGNOSIS — R9389 Abnormal findings on diagnostic imaging of other specified body structures: Secondary | ICD-10-CM

## 2016-07-14 LAB — COMPREHENSIVE METABOLIC PANEL
ALT: 24 U/L (ref 0–35)
ALT: 24 U/L (ref 14–54)
AST: 29 U/L (ref 0–37)
AST: 39 U/L (ref 15–41)
Albumin: 3.9 g/dL (ref 3.5–5.2)
Albumin: 3.8 g/dL (ref 3.5–5.0)
Alkaline Phosphatase: 61 U/L (ref 39–117)
Alkaline Phosphatase: 58 U/L (ref 38–126)
Anion gap: 8 (ref 5–15)
BUN: 18 mg/dL (ref 6–23)
BUN: 17 mg/dL (ref 6–20)
CO2: 33 mEq/L — ABNORMAL HIGH (ref 19–32)
CO2: 26 mmol/L (ref 22–32)
Calcium: 9.2 mg/dL (ref 8.4–10.5)
Calcium: 8.5 mg/dL — ABNORMAL LOW (ref 8.9–10.3)
Chloride: 104 mEq/L (ref 96–112)
Chloride: 104 mmol/L (ref 101–111)
Creatinine, Ser: 1.33 mg/dL — ABNORMAL HIGH (ref 0.40–1.20)
Creatinine, Ser: 1.18 mg/dL — ABNORMAL HIGH (ref 0.44–1.00)
GFR calc Af Amer: 54 mL/min — ABNORMAL LOW (ref >60)
GFR calc non Af Amer: 47 mL/min — ABNORMAL LOW (ref >60)
GFR: 42.32 mL/min — ABNORMAL LOW (ref >60.00)
Glucose, Bld: 136 mg/dL — ABNORMAL HIGH (ref 70–99)
Glucose, Bld: 117 mg/dL — ABNORMAL HIGH (ref 65–99)
Potassium: 3.9 mEq/L (ref 3.5–5.1)
Potassium: 4 mmol/L (ref 3.5–5.1)
Sodium: 140 mEq/L (ref 135–145)
Sodium: 138 mmol/L (ref 135–145)
Total Bilirubin: 0.5 mg/dL (ref 0.2–1.2)
Total Bilirubin: 0.6 mg/dL (ref 0.3–1.2)
Total Protein: 6.7 g/dL (ref 6.0–8.3)
Total Protein: 6.8 g/dL (ref 6.5–8.1)

## 2016-07-14 LAB — URINALYSIS, COMPLETE (UACMP) WITH MICROSCOPIC
Specific Gravity, Urine: 1.013 (ref 1.005–1.030)
Squamous Epithelial / LPF: NONE SEEN

## 2016-07-14 LAB — CBC
HCT: 41 % (ref 35.0–47.0)
Hemoglobin: 14.1 g/dL (ref 12.0–16.0)
MCH: 30.1 pg (ref 26.0–34.0)
MCHC: 34.3 g/dL (ref 32.0–36.0)
MCV: 87.7 fL (ref 80.0–100.0)
Platelets: 127 10*3/uL — ABNORMAL LOW (ref 150–440)
RBC: 4.68 MIL/uL (ref 3.80–5.20)
RDW: 13.8 % (ref 11.5–14.5)
WBC: 3 10*3/uL — ABNORMAL LOW (ref 3.6–11.0)

## 2016-07-14 LAB — CBC WITH DIFFERENTIAL/PLATELET
Basophils Absolute: 0 10*3/uL (ref 0.0–0.1)
Basophils Relative: 1 % (ref 0.0–3.0)
Eosinophils Absolute: 0 10*3/uL (ref 0.0–0.7)
Eosinophils Relative: 0.9 % (ref 0.0–5.0)
HCT: 42.5 % (ref 36.0–46.0)
Hemoglobin: 14 g/dL (ref 12.0–15.0)
Lymphocytes Relative: 29.1 % (ref 12.0–46.0)
Lymphs Abs: 0.7 10*3/uL (ref 0.7–4.0)
MCHC: 33 g/dL (ref 30.0–36.0)
MCV: 90.1 fl (ref 78.0–100.0)
Monocytes Absolute: 0.3 10*3/uL (ref 0.1–1.0)
Monocytes Relative: 13.5 % — ABNORMAL HIGH (ref 3.0–12.0)
Neutro Abs: 1.4 10*3/uL (ref 1.4–7.7)
Neutrophils Relative %: 55.5 % (ref 43.0–77.0)
Platelets: 130 10*3/uL — ABNORMAL LOW (ref 150.0–400.0)
RBC: 4.71 Mil/uL (ref 3.87–5.11)
RDW: 14 % (ref 11.5–15.5)
WBC: 2.5 10*3/uL — ABNORMAL LOW (ref 4.0–10.5)

## 2016-07-14 LAB — PROTIME-INR
INR: 1 ratio (ref 0.8–1.0)
Prothrombin Time: 10.9 s (ref 9.6–13.1)

## 2016-07-14 LAB — APTT: aPTT: 26.8 s (ref 23.4–32.7)

## 2016-07-14 NOTE — ED Notes (Signed)
Pt still in US

## 2016-07-14 NOTE — Telephone Encounter (Signed)
Spoke with patient states her flow  has increased, having to change her pad when standing  up. Color is purple to reddish dark red. States it looks there is tissue mixed in   States she is having to change pad frequently . Before pelvic exam she was just spotting.   Per verbal orders from Dr Nicki Reaper if patients flow of blood has increased patient should go to ER for evaluation so she can get hemoglobin checked. Referral has been placed for gyn evaluation no appointment scheduled yet .   Patient verbalized understanding to go to ER for evaluation.

## 2016-07-14 NOTE — Assessment & Plan Note (Signed)
Diagnosed by urgent care.  Has been taking tamiflu since Saturday jan 27.  Feeling better and exam is normal. Advised to remain out of work until afebrile off tylenol,  Work note given

## 2016-07-14 NOTE — Telephone Encounter (Signed)
Pt called and stated that since the pelvic exam yesterday she is bleeding more and passing tissues. Should she be concerned and what should she do? Please advise,thank you!  Call pt @ [phone]

## 2016-07-14 NOTE — Discharge Instructions (Signed)
As we discussed, your blood work was normal today (tell Dr. Garwin Brothers your hemoglobin was 14.1 and your hematocrit was 41.0).  Your ultrasound shows a thickened endometrium, and we strongly encourage you to see Dr. Garwin Brothers tomorrow as scheduled for further evaluation.  You may benefit from additional testing such as endometrial biopsy.  We sent a CD home with you that includes today's ultrasounds - please bring this with you to your appointment tomorrow.  Return to the emergency department if you develop new or worsening symptoms that concern you.

## 2016-07-14 NOTE — ED Provider Notes (Signed)
[facility] Emergency Department Provider Note  ____________________________________________   First MD Initiated Contact with Patient 07/14/16 1123     (approximate)  I have reviewed the triage vital signs and the nursing notes.   HISTORY  Chief Complaint Vaginal Bleeding    HPI Jaime Hall is a 66 y.o. female who presents for evaluation of persistent post-menopausal vaginal bleeding.  She reports light spotting for about 2 weeks.  She saw her PCP ([name]) yesterday and had a pelvic exam that demonstrated blood in the vaginal canal (more than just spotting).  She is being referred to Dr. Garwin Brothers, an OB/GYN in [place] whom the patient saw last year for a cervical polyp removal.  However, the patient reports that the bleeding has increased since her exam yesterday, and that she has changed three pads this morning.  She also reports passing "tissue", which she describes as clumps of purplish material.    She denies lightheadedness, dizziness, abdominal pain, cramping, nausea, vomiting, dysuria.  She describes the symptoms as gradual onset and currently moderate in severity.  Nothing in particular makes it better nor worse.  She denies fever/chills, chest pain, shortness of breath, nasal congestion, runny nose.   Past Medical History:  Diagnosis Date  . Cervicalgia   . Coronary artery disease 02/2012   Abnormal stress test with anterior wall ischemia. Cardiac catheterization showed a 95% stenosis in the proximal LAD bifurcating with a 90% stenosis in first diagonal. Ejection fraction was 45% with anterior wall hypokinesis. She underwent balloon angioplasty to ostial first diagonal and drug-eluting stent placement to proximal LAD with a 3.0 x 15 mm Xience EX drug-eluting stent  . Essential hypertension, benign   . Fibrocystic breast disease   . Gestational hypertension   . Heart disease   . Heart murmur   . History of anemia   . History of blood  transfusion   . Hodgkin's lymphoma ([place]) 2011   s/p radiation and chemo therapy  . Osteoarthritis   . Polycystic ovarian disease     Patient Active Problem List   Diagnosis Date Noted  . Postmenopausal bleeding 07/14/2016  . Influenza 07/14/2016  . Class 1 obesity due to excess calories without serious comorbidity with body mass index (BMI) of 34.0 to 34.9 in adult 05/27/2016  . Chronic cough 12/19/2015  . Pre-syncope 12/19/2015  . Hx of colonic polyps   . Benign neoplasm of sigmoid colon   . Dysphagia, pharyngoesophageal phase 10/01/2014  . Prolapsed, uterovaginal, incomplete 06/24/2014  . Routine general medical examination at a health care facility 12/30/2012  . Obesity (BMI 30-39.9) 12/30/2012  . Hx of multiple pulmonary nodules 12/28/2012  . Plantar fasciitis of right foot 12/28/2012  . 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 Edema of both legs 09/12/2012  . Hip pain, bilateral 09/12/2012  . History of Hodgkin's lymphoma 09/12/2012  . Coronary artery disease   . Hyperlipidemia   . Chest pain on exertion 02/29/2012    Past Surgical History:  Procedure Laterality Date  . bladder sling    . CARDIAC CATHETERIZATION  02/2012   [facility]  . CERVICAL POLYPECTOMY    . CHOLECYSTECTOMY  1982  . COLONOSCOPY WITH PROPOFOL N/A 02/05/2015   Procedure: COLONOSCOPY WITH PROPOFOL;  Surgeon: Lucilla Lame, MD;  Location: [facility] ENDOSCOPY;  Service: Endoscopy;  Laterality: N/A;  . CORONARY ANGIOPLASTY  02/2012   left/right s/p balloon  . heart stent'  2013  . transobturator sling N/A 2009   [place]  Prior to Admission medications   Medication Sig Start Date End Date Taking? Authorizing Provider  aspirin 81 MG tablet Take 81 mg by mouth daily.   Yes Historical Provider, MD  atorvastatin (LIPITOR) 10 MG tablet TAKE ONE TABLET BY MOUTH ONCE DAILY 03/27/14  Yes Wellington Hampshire, MD  B Complex Vitamins (B COMPLEX 1 PO) Take 1 tablet by mouth daily.   Yes Historical Provider,  MD  benzonatate (TESSALON) 100 MG capsule Take 100 mg by mouth 3 (three) times daily as needed for cough.   Yes Historical Provider, MD  Cholecalciferol (VITAMIN D) 2000 UNITS tablet Take 2,000 Units by mouth daily. Reported on 12/18/2015   Yes Historical Provider, MD  Cyanocobalamin (VITAMIN B-12) 1000 MCG SUBL Place 1 tablet under the tongue daily.   Yes Historical Provider, MD  docusate (COLACE) 50 MG/5ML liquid 2-3 drops in each ear nightly 04/29/15  Yes [name], MD  esomeprazole (NEXIUM) 20 MG capsule Take 20 mg by mouth daily at 12 noon.   Yes Historical Provider, MD  metoprolol tartrate (LOPRESSOR) 25 MG tablet TAKE ONE TABLET BY MOUTH TWICE DAILY 06/11/16  Yes Wellington Hampshire, MD  nitroGLYCERIN (NITROSTAT) 0.4 MG SL tablet Place 1 tablet (0.4 mg total) under the tongue every 5 (five) minutes as needed for chest pain. 02/22/13  Yes Minna Merritts, MD  oseltamivir (TAMIFLU) 75 MG capsule Take 1 capsule by mouth 2 (two) times daily. Pt only had one dose on Saturday 07/11/16 07/16/16 Yes Historical Provider, MD  TURMERIC PO Take by mouth.   Yes Historical Provider, MD  BIOTIN PO Take by mouth daily. Reported on 11/22/2015    Historical Provider, MD  diazepam (VALIUM) 5 MG tablet Take 1 tablet (5 mg total) by mouth every 12 (twelve) hours as needed for anxiety. 03/03/16   [name], MD  lansoprazole (PREVACID) 30 MG capsule Take 1 capsule (30 mg total) by mouth daily at 12 noon. 02/06/16   Wilhelmina Mcardle, MD    Allergies Z-pak [azithromycin]; Antifungal [miconazole nitrate]; and Sulfa antibiotics  Family History  Problem Relation Age of Onset  . ALS Father   . Polymyositis Father   . Diabetes Brother   . Cancer Maternal Aunt     breast  . Breast cancer Maternal Aunt     30's  . Stroke Maternal Grandmother   . Cancer Maternal Grandfather     prostate  . Stroke Maternal Grandfather     Social History Social History  Substance Use Topics  . Smoking status: Former Smoker     Packs/day: 1.00    Years: 30.00    Types: Cigarettes  . Smokeless tobacco: Never Used     Comment: quit smoking in 2000  . Alcohol use Yes     Comment: occasional    Review of Systems Constitutional: No fever/chills Eyes: No visual changes. ENT: No sore throat. Cardiovascular: Denies chest pain. Respiratory: Denies shortness of breath. Gastrointestinal: No abdominal pain.  No nausea, no vomiting.  No diarrhea.  No constipation. Genitourinary: Post-menopausal vaginal bleeding x 2 weeks, now worse.  Negative for dysuria. Musculoskeletal: Negative for back pain. Skin: Negative for rash. Neurological: Negative for headaches, focal weakness or numbness.  Denies lightheadedness/dizziness  10-point ROS otherwise negative.  ____________________________________________   PHYSICAL EXAM:  VITAL SIGNS: ED Triage Vitals  Enc Vitals Group     BP 07/14/16 1030 138/68     Pulse Rate 07/14/16 1030 82     Resp 07/14/16 1030  20     Temp 07/14/16 1030 98.4 F (36.9 C)     Temp Source 07/14/16 1030 Oral     SpO2 07/14/16 1030 96 %     Weight 07/14/16 1031 175 lb (79.4 kg)     Height 07/14/16 1031 5' 1.75" (1.568 m)     Head Circumference --      Peak Flow --      Pain Score --      Pain Loc --      Pain Edu? --      Excl. in [place]? --     Constitutional: Alert and oriented. Well appearing and in no acute distress. Eyes: Conjunctivae are normal. PERRL. EOMI. Head: Atraumatic. Nose: No congestion/rhinnorhea. Mouth/Throat: Mucous membranes are moist.  Oropharynx non-erythematous. Neck: No stridor.  No meningeal signs.   Cardiovascular: Normal rate, regular rhythm. Good peripheral circulation. Grossly normal heart sounds. Respiratory: Normal respiratory effort.  No retractions. Lungs CTAB. Gastrointestinal: Soft and nontender. No distention.  Genitourinary: deferred - patient had pelvic exam yesterday at PCP's office which was unremarkable except for blood in vaginal  vault Musculoskeletal: No lower extremity tenderness nor edema. No gross deformities of extremities. Neurologic:  Normal speech and language. No gross focal neurologic deficits are appreciated.  Skin:  Skin is warm, dry and intact. No rash noted. Psychiatric: Mood and affect are normal. Speech and behavior are normal.  ____________________________________________   LABS (all labs ordered are listed, but only abnormal results are displayed)  Labs Reviewed  CBC - Abnormal; Notable for the following:       Result Value   WBC 3.0 (*)    Platelets 127 (*)    All other components within normal limits  COMPREHENSIVE METABOLIC PANEL - Abnormal; Notable for the following:    Glucose, Bld 117 (*)    Creatinine, Ser 1.18 (*)    Calcium 8.5 (*)    GFR calc non Af Amer 47 (*)    GFR calc Af Amer 54 (*)    All other components within normal limits  URINALYSIS, COMPLETE (UACMP) WITH MICROSCOPIC - Abnormal; Notable for the following:    Color, Urine RED (*)    APPearance CLOUDY (*)    Glucose, UA   (*)    Value: TEST NOT REPORTED DUE TO COLOR INTERFERENCE OF URINE PIGMENT   Hgb urine dipstick   (*)    Value: TEST NOT REPORTED DUE TO COLOR INTERFERENCE OF URINE PIGMENT   Bilirubin Urine   (*)    Value: TEST NOT REPORTED DUE TO COLOR INTERFERENCE OF URINE PIGMENT   Ketones, ur   (*)    Value: TEST NOT REPORTED DUE TO COLOR INTERFERENCE OF URINE PIGMENT   Protein, ur   (*)    Value: TEST NOT REPORTED DUE TO COLOR INTERFERENCE OF URINE PIGMENT   Nitrite   (*)    Value: TEST NOT REPORTED DUE TO COLOR INTERFERENCE OF URINE PIGMENT   Leukocytes, UA   (*)    Value: TEST NOT REPORTED DUE TO COLOR INTERFERENCE OF URINE PIGMENT   Bacteria, UA MANY (*)    All other components within normal limits  URINE CULTURE   ____________________________________________  EKG  None - EKG not ordered by ED physician ____________________________________________  RADIOLOGY   US Transvaginal  Non-ob  Result Date: 07/14/2016 CLINICAL DATA:  Postmenopausal bleeding for the past 2 weeks, passing clots and possible tissue. History of a previous bladder Sitz laying surgical procedure. In remission of Hodgkin's lymphoma  for the past 6 years. EXAM: TRANSABDOMINAL AND TRANSVAGINAL ULTRASOUND OF PELVIS TECHNIQUE: Both transabdominal and transvaginal ultrasound examinations of the pelvis were performed. Transabdominal technique was performed for global imaging of the pelvis including uterus, ovaries, adnexal regions, and pelvic cul-de-sac. It was necessary to proceed with endovaginal exam following the transabdominal exam to visualize the endometrium and ovaries. COMPARISON:  Abdominal and pelvic CT scan of September 10, 2010. FINDINGS: Uterus Measurements: 7.4 x 3.7 x 4.9 cm. No fibroids are observed. Endometrium Thickness: 25.8 mm. Marked diffuse thickening of the endometrium. There punctate peripheral calcifications. Right ovary Measurements: The right ovary could not be visualized. Left ovary Measurements: The left ovary could not be visualized. Other findings No abnormal free fluid. IMPRESSION: Markedly thickened endometrium without a discrete mass. In the setting of post-menopausal bleeding, endometrial sampling is indicated to exclude carcinoma. If results are benign, sonohysterogram should be considered for focal lesion work-up. (Ref: Radiological Reasoning: Algorithmic Workup of Abnormal Vaginal Bleeding with Endovaginal Sonography and Sonohysterography. AJR 2008; [id number]) Nonvisualization of the ovaries. No adnexal mass observed. No free pelvic fluid. Electronically Signed   By: David  Martinique M.D.   On: 07/14/2016 12:54   US Pelvis Complete  Result Date: 07/14/2016 CLINICAL DATA:  Postmenopausal bleeding for the past 2 weeks, passing clots and possible tissue. History of a previous bladder Sitz laying surgical procedure. In remission of Hodgkin's lymphoma for the past 6 years. EXAM: TRANSABDOMINAL  AND TRANSVAGINAL ULTRASOUND OF PELVIS TECHNIQUE: Both transabdominal and transvaginal ultrasound examinations of the pelvis were performed. Transabdominal technique was performed for global imaging of the pelvis including uterus, ovaries, adnexal regions, and pelvic cul-de-sac. It was necessary to proceed with endovaginal exam following the transabdominal exam to visualize the endometrium and ovaries. COMPARISON:  Abdominal and pelvic CT scan of September 10, 2010. FINDINGS: Uterus Measurements: 7.4 x 3.7 x 4.9 cm. No fibroids are observed. Endometrium Thickness: 25.8 mm. Marked diffuse thickening of the endometrium. There punctate peripheral calcifications. Right ovary Measurements: The right ovary could not be visualized. Left ovary Measurements: The left ovary could not be visualized. Other findings No abnormal free fluid. IMPRESSION: Markedly thickened endometrium without a discrete mass. In the setting of post-menopausal bleeding, endometrial sampling is indicated to exclude carcinoma. If results are benign, sonohysterogram should be considered for focal lesion work-up. (Ref: Radiological Reasoning: Algorithmic Workup of Abnormal Vaginal Bleeding with Endovaginal Sonography and Sonohysterography. AJR 2008; [id number]) Nonvisualization of the ovaries. No adnexal mass observed. No free pelvic fluid. Electronically Signed   By: David  Martinique M.D.   On: 07/14/2016 12:54    ____________________________________________   PROCEDURES  Procedure(s) performed:   Procedures   Critical Care performed: No ____________________________________________   INITIAL IMPRESSION / ASSESSMENT AND PLAN / ED COURSE  Pertinent labs & imaging results that were available during my care of the patient were reviewed by me and considered in my medical decision making (see chart for details).  The patient is hemodynamically stable and in no distress.  Relatively heavy postmenopausal vaginal bleeding is of course concerning  and she does have a history of lymphoma.  I will obtain an ultrasound while she is here in the emergency department to facilitate outpatient workup as well as to rule out any acute or emergent condition that is currently present which would account for her increase in bleeding since yesterday.  I called and spoke with a nurse at the office of Dr. cousins [facility], [phone]).  She is going to speak with the medical assistant and  schedule her and they are going to call the patient with the next available appointment for follow-up.  I told her I would provide the patient with an ultrasound CD as well.  Discussed with patient who agrees with the plan.  Clinical Course as of Jul 14 1334  Tue Jul 14, 2016  1330 thickened endometrium without mass or tumor.  Her OB/GYN called me back and gave me a specific time tomorrow morning for the follow-up appointment which I put on her discharge instructions.  I reiterated with the patient how important it is that she follow up as planned and she said she will do so.  Her urine shows many bacteria but it was also heavily contaminated with the vaginal blood.  I sent a urine culture but will not treat empirically since she is denying any urinary symptoms.I gave my usual and customary return precautions.   [CF]    Clinical Course User Index [CF] Hinda Kehr, MD    ____________________________________________  FINAL CLINICAL IMPRESSION(S) / ED DIAGNOSES  Final diagnoses:  Post-menopausal bleeding  Thickened endometrium     MEDICATIONS GIVEN DURING THIS VISIT:  Medications - No data to display   NEW OUTPATIENT MEDICATIONS STARTED DURING THIS VISIT:  New Prescriptions   No medications on file    Modified Medications   No medications on file    Discontinued Medications   ATORVASTATIN (LIPITOR) 10 MG TABLET    TAKE ONE TABLET BY MOUTH ONCE DAILY   PYRIDOXINE (VITAMIN B-6) 100 MG TABLET    Take 100 mg by mouth 3 (three) times a week.  Reported on 11/22/2015     Note:  This document was prepared using Dragon voice recognition software and may include unintentional dictation errors.    Hinda Kehr, MD 07/14/16 1336

## 2016-07-14 NOTE — ED Notes (Signed)
Patient transported to Ultrasound 

## 2016-07-14 NOTE — Assessment & Plan Note (Signed)
PAP smear attempted today but may be inconclusive due to blood . Referral to Dr. Garwin Brothers for evaluation to rule out endometrial hyperplasia/CA.  coags and cbc ordered

## 2016-07-14 NOTE — ED Triage Notes (Signed)
Pt with vaginal bleeding x 2 weeks with bleeding increasing yesterday. Sent by MD for hemoglobin check.

## 2016-07-14 NOTE — Telephone Encounter (Signed)
Patient in ER

## 2016-07-14 NOTE — ED Triage Notes (Deleted)
Pt with vaginal bleeding x 2 weeks that has been getting worse. Sent here by MD for hemoglobin check.

## 2016-07-15 ENCOUNTER — Other Ambulatory Visit (HOSPITAL_COMMUNITY)
Admission: RE | Admit: 2016-07-15 | Discharge: 2016-07-15 | Disposition: A | Discharge status: Home / Self Care | Payer: PPO | Source: Ambulatory Visit | Attending: Obstetrics and Gynecology | Admitting: Obstetrics and Gynecology

## 2016-07-15 DIAGNOSIS — N950 Postmenopausal bleeding: Secondary | ICD-10-CM | POA: Diagnosis not present

## 2016-07-15 DIAGNOSIS — C541 Malignant neoplasm of endometrium: Secondary | ICD-10-CM | POA: Diagnosis not present

## 2016-07-15 DIAGNOSIS — R938 Abnormal findings on diagnostic imaging of other specified body structures: Secondary | ICD-10-CM | POA: Diagnosis not present

## 2016-07-15 DIAGNOSIS — N9489 Other specified conditions associated with female genital organs and menstrual cycle: Secondary | ICD-10-CM | POA: Diagnosis not present

## 2016-07-15 LAB — CYTOLOGY - PAP
Diagnosis: NEGATIVE
HPV: NOT DETECTED

## 2016-07-16 ENCOUNTER — Telehealth: Payer: Self-pay | Admitting: Internal Medicine

## 2016-07-16 LAB — URINE CULTURE
Culture: <10000 — AB
Special Requests: NORMAL

## 2016-07-16 NOTE — Telephone Encounter (Signed)
Pt called and stated that she was diagnosed with Uterine Cancer and has an appt with the vascular on Monday and she is not sure if she should keep her appt. She would like some advise. Please advise, thank you!\  Call pt @ [phone]

## 2016-07-17 NOTE — Telephone Encounter (Signed)
Last OV with you was 07/13/16. She has no existing with you. Please advise on below.

## 2016-07-17 NOTE — Telephone Encounter (Signed)
Pt informed

## 2016-07-17 NOTE — Telephone Encounter (Signed)
Pt called back looking for a status update. She needs to know cancel her vascular appt on Monday that [name] referred her to. She needs to cancel by 4 pm. Please advise, thank you!  Call pt @ [phone]

## 2016-07-17 NOTE — Telephone Encounter (Signed)
I would keep the appt with vascular unless she has a conflicting appointment with the gynecologist

## 2016-07-20 ENCOUNTER — Ambulatory Visit (INDEPENDENT_AMBULATORY_CARE_PROVIDER_SITE_OTHER): Payer: PPO | Admitting: Vascular Surgery

## 2016-07-20 ENCOUNTER — Encounter (INDEPENDENT_AMBULATORY_CARE_PROVIDER_SITE_OTHER): Payer: Self-pay | Admitting: Vascular Surgery

## 2016-07-20 VITALS — BP 149/78 | HR 87 | Resp 16 | Ht 61.75 in | Wt 174.0 lb

## 2016-07-20 DIAGNOSIS — I890 Lymphedema, not elsewhere classified: Secondary | ICD-10-CM

## 2016-07-20 DIAGNOSIS — Z8571 Personal history of Hodgkin lymphoma: Secondary | ICD-10-CM

## 2016-07-20 DIAGNOSIS — C8590 Non-Hodgkin lymphoma, unspecified, unspecified site: Secondary | ICD-10-CM

## 2016-07-20 DIAGNOSIS — I872 Venous insufficiency (chronic) (peripheral): Secondary | ICD-10-CM | POA: Insufficient documentation

## 2016-07-20 DIAGNOSIS — R600 Localized edema: Secondary | ICD-10-CM

## 2016-07-20 DIAGNOSIS — G63 Polyneuropathy in diseases classified elsewhere: Secondary | ICD-10-CM | POA: Diagnosis not present

## 2016-07-20 DIAGNOSIS — I739 Peripheral vascular disease, unspecified: Secondary | ICD-10-CM | POA: Insufficient documentation

## 2016-07-20 NOTE — Progress Notes (Signed)
MRN : [id number]  Jaime Hall is a 66 y.o. (07/11/1950) female who presents with chief complaint of  Chief Complaint  Patient presents with  . New Patient (Initial Visit)  .  History of Present Illness: Patient is seen for evaluation of leg swelling. The patient first noticed the swelling remotely but is now concerned because of a significant increase in the overall edema. The swelling is associated with pain and discoloration. The patient notes that in the morning the legs are significantly improved but they steadily worsened throughout the course of the day. Elevation makes the legs better, dependency makes them much worse.   There is no history of ulcerations associated with the swelling.   The patient denies any recent changes in their medications.  The patient has not been wearing graduated compression.  The patient has no had any past angiography, interventions or vascular surgery.  The patient denies a history of DVT or PE. There is no prior history of phlebitis. There is no history of primary lymphedema.  There is no history of radiation treatment to the groin or pelvis No history of malignancies. No history of trauma or groin or pelvic surgery. No history of foreign travel or parasitic infections area    Current Meds  Medication Sig  . aspirin 81 MG tablet Take 81 mg by mouth daily.  Marland Kitchen atorvastatin (LIPITOR) 10 MG tablet TAKE ONE TABLET BY MOUTH ONCE DAILY  . B Complex Vitamins (B COMPLEX 1 PO) Take 1 tablet by mouth daily.  . benzonatate (TESSALON) 100 MG capsule Take 100 mg by mouth 3 (three) times daily as needed for cough.  Marland Kitchen BIOTIN PO Take by mouth daily. Reported on 11/22/2015  . Cholecalciferol (VITAMIN D) 2000 UNITS tablet Take 2,000 Units by mouth daily. Reported on 12/18/2015  . Cyanocobalamin (VITAMIN B-12) 1000 MCG SUBL Place 1 tablet under the tongue daily.  . diazepam (VALIUM) 5 MG tablet Take 1 tablet (5 mg total) by mouth every 12 (twelve) hours as  needed for anxiety.  . docusate (COLACE) 50 MG/5ML liquid 2-3 drops in each ear nightly  . esomeprazole (NEXIUM) 20 MG capsule Take 20 mg by mouth daily at 12 noon.  . lansoprazole (PREVACID) 30 MG capsule Take 1 capsule (30 mg total) by mouth daily at 12 noon.  . metoprolol tartrate (LOPRESSOR) 25 MG tablet TAKE ONE TABLET BY MOUTH TWICE DAILY  . nitroGLYCERIN (NITROSTAT) 0.4 MG SL tablet Place 1 tablet (0.4 mg total) under the tongue every 5 (five) minutes as needed for chest pain.  . TURMERIC PO Take by mouth.    Past Medical History:  Diagnosis Date  . Cervicalgia   . Coronary artery disease 02/2012   Abnormal stress test with anterior wall ischemia. Cardiac catheterization showed a 95% stenosis in the proximal LAD bifurcating with a 90% stenosis in first diagonal. Ejection fraction was 45% with anterior wall hypokinesis. She underwent balloon angioplasty to ostial first diagonal and drug-eluting stent placement to proximal LAD with a 3.0 x 15 mm Xience EX drug-eluting stent  . Essential hypertension, benign   . Fibrocystic breast disease   . Gestational hypertension   . Heart disease   . Heart murmur   . History of anemia   . History of blood transfusion   . Hodgkin's lymphoma ([place]) 2011   s/p radiation and chemo therapy  . Osteoarthritis   . Polycystic ovarian disease     Past Surgical History:  Procedure Laterality Date  . bladder  sling    . CARDIAC CATHETERIZATION  02/2012   [facility]  . CERVICAL POLYPECTOMY    . CHOLECYSTECTOMY  1982  . COLONOSCOPY WITH PROPOFOL N/A 02/05/2015   Procedure: COLONOSCOPY WITH PROPOFOL;  Surgeon: Lucilla Lame, MD;  Location: [facility] ENDOSCOPY;  Service: Endoscopy;  Laterality: N/A;  . CORONARY ANGIOPLASTY  02/2012   left/right s/p balloon  . heart stent'  2013  . transobturator sling N/A 2009   [facility]    Social History Social History  Substance Use Topics  . Smoking status: Former Smoker    Packs/day: 1.00    Years: 30.00    Types:  Cigarettes  . Smokeless tobacco: Never Used     Comment: quit smoking in 2000  . Alcohol use Yes     Comment: occasional    Family History Family History  Problem Relation Age of Onset  . ALS Father   . Polymyositis Father   . Diabetes Brother   . Cancer Maternal Aunt     breast  . Breast cancer Maternal Aunt     30's  . Stroke Maternal Grandmother   . Cancer Maternal Grandfather     prostate  . Stroke Maternal Grandfather   No family history of bleeding/clotting disorders, porphyria or autoimmune disease   Allergies  Allergen Reactions  . Z-Pak [Azithromycin] Itching  . Antifungal [Miconazole Nitrate] Rash  . Sulfa Antibiotics Nausea And Vomiting and Rash    N/T     REVIEW OF SYSTEMS (Negative unless checked)  Constitutional: [] Weight loss  [] Fever  [] Chills Cardiac: [] Chest pain   [] Chest pressure   [] Palpitations   [] Shortness of breath when laying flat   [] Shortness of breath with exertion. Vascular:  [] Pain in legs with walking   [] Pain in legs at rest  [] History of DVT   [] Phlebitis   [x] Swelling in legs   [] Varicose veins   [] Non-healing ulcers Pulmonary:   [] Uses home oxygen   [] Productive cough   [] Hemoptysis   [] Wheeze  [] COPD   [] Asthma Neurologic:  [] Dizziness   [] Seizures   [] History of stroke   [] History of TIA  [] Aphasia   [] Vissual changes   [] Weakness or numbness in arm   [] Weakness or numbness in leg Musculoskeletal:   [] Joint swelling   [] Joint pain   [] Low back pain Hematologic:  [] Easy bruising  [] Easy bleeding   [] Hypercoagulable state   [] Anemic Gastrointestinal:  [] Diarrhea   [] Vomiting  [] Gastroesophageal reflux/heartburn   [] Difficulty swallowing. Genitourinary:  [] Chronic kidney disease   [] Difficult urination  [] Frequent urination   [] Blood in urine Skin:  [] Rashes   [] Ulcers  Psychological:  [] History of anxiety   []  History of major depression.  Physical Examination  Vitals:   07/20/16 1517  BP: (!) 149/78  Pulse: 87  Resp: 16    Weight: 174 lb (78.9 kg)  Height: 5' 1.75" (1.568 m)   Body mass index is 32.08 kg/m. Gen: WD/WN, NAD Head: [place]/AT, No temporalis wasting.  Ear/Nose/Throat: Hearing grossly intact, nares w/o erythema or drainage, poor dentition Eyes: PER, EOMI, sclera nonicteric.  Neck: Supple, no masses.  No bruit or JVD.  Pulmonary:  Good air movement, clear to auscultation bilaterally, no use of accessory muscles.  Cardiac: RRR, normal S1, S2, no Murmurs. Vascular:   2-3+ pitting edema of the ankls bilaterally mild venous stasis changes of the ankles.  Feet cool to touch with sluggish cap refill Vessel Right Left  Radial Palpable Palpable  Ulnar Palpable Palpable  Brachial Palpable Palpable  Carotid Palpable Palpable  Femoral Palpable Palpable  Popliteal Trace Palpable Trace Palpable  PT Not Palpable Not Palpable  DP Not Palpable Not Palpable   Gastrointestinal: soft, non-distended. No guarding/no peritoneal signs.  Musculoskeletal: M/S 5/5 throughout.  No deformity or atrophy.  Neurologic: CN 2-12 intact. Pain and light touch intact in extremities.  Symmetrical.  Speech is fluent. Motor exam as listed above. Psychiatric: Judgment intact, Mood & affect appropriate for pt's clinical situation. Dermatologic: No rashes or ulcers noted.  No changes consistent with cellulitis. Lymph : No Cervical lymphadenopathy, no lichenification or skin changes of chronic lymphedema.  CBC Lab Results  Component Value Date   WBC 3.0 (L) 07/14/2016   HGB 14.1 07/14/2016   HCT 41.0 07/14/2016   MCV 87.7 07/14/2016   PLT 127 (L) 07/14/2016    BMET    Component Value Date/Time   NA 138 07/14/2016 1045   NA 142 03/27/2014 1214   K 4.0 07/14/2016 1045   K 4.6 03/27/2014 1214   CL 104 07/14/2016 1045   CL 107 03/27/2014 1214   CO2 26 07/14/2016 1045   CO2 33 (H) 03/27/2014 1214   GLUCOSE 117 (H) 07/14/2016 1045   GLUCOSE 77 03/27/2014 1214   BUN 17 07/14/2016 1045   BUN 14 03/27/2014 1214   CREATININE  1.18 (H) 07/14/2016 1045   CREATININE 1.04 03/27/2014 1214   CALCIUM 8.5 (L) 07/14/2016 1045   CALCIUM 8.7 03/27/2014 1214   GFRNONAA 47 (L) 07/14/2016 1045   GFRNONAA 57 (L) 03/27/2014 1214   GFRNONAA 43 (L) 07/21/2013 1429   GFRAA 54 (L) 07/14/2016 1045   GFRAA >60 03/27/2014 1214   GFRAA 50 (L) 07/21/2013 1429   Estimated Creatinine Clearance: 45.4 mL/min (by C-G formula based on SCr of 1.18 mg/dL (H)).  COAG Lab Results  Component Value Date   INR 1.0 07/13/2016   INR 1.0 02/29/2012    Radiology US Transvaginal Non-ob  Result Date: 07/14/2016 CLINICAL DATA:  Postmenopausal bleeding for the past 2 weeks, passing clots and possible tissue. History of a previous bladder Sitz laying surgical procedure. In remission of Hodgkin's lymphoma for the past 6 years. EXAM: TRANSABDOMINAL AND TRANSVAGINAL ULTRASOUND OF PELVIS TECHNIQUE: Both transabdominal and transvaginal ultrasound examinations of the pelvis were performed. Transabdominal technique was performed for global imaging of the pelvis including uterus, ovaries, adnexal regions, and pelvic cul-de-sac. It was necessary to proceed with endovaginal exam following the transabdominal exam to visualize the endometrium and ovaries. COMPARISON:  Abdominal and pelvic CT scan of September 10, 2010. FINDINGS: Uterus Measurements: 7.4 x 3.7 x 4.9 cm. No fibroids are observed. Endometrium Thickness: 25.8 mm. Marked diffuse thickening of the endometrium. There punctate peripheral calcifications. Right ovary Measurements: The right ovary could not be visualized. Left ovary Measurements: The left ovary could not be visualized. Other findings No abnormal free fluid. IMPRESSION: Markedly thickened endometrium without a discrete mass. In the setting of post-menopausal bleeding, endometrial sampling is indicated to exclude carcinoma. If results are benign, sonohysterogram should be considered for focal lesion work-up. (Ref: Radiological Reasoning: Algorithmic Workup  of Abnormal Vaginal Bleeding with Endovaginal Sonography and Sonohysterography. AJR 2008; [id number]) Nonvisualization of the ovaries. No adnexal mass observed. No free pelvic fluid. Electronically Signed   By: David  Martinique M.D.   On: 07/14/2016 12:54   US Pelvis Complete  Result Date: 07/14/2016 CLINICAL DATA:  Postmenopausal bleeding for the past 2 weeks, passing clots and possible tissue. History of a previous bladder Sitz laying surgical procedure. In  remission of Hodgkin's lymphoma for the past 6 years. EXAM: TRANSABDOMINAL AND TRANSVAGINAL ULTRASOUND OF PELVIS TECHNIQUE: Both transabdominal and transvaginal ultrasound examinations of the pelvis were performed. Transabdominal technique was performed for global imaging of the pelvis including uterus, ovaries, adnexal regions, and pelvic cul-de-sac. It was necessary to proceed with endovaginal exam following the transabdominal exam to visualize the endometrium and ovaries. COMPARISON:  Abdominal and pelvic CT scan of September 10, 2010. FINDINGS: Uterus Measurements: 7.4 x 3.7 x 4.9 cm. No fibroids are observed. Endometrium Thickness: 25.8 mm. Marked diffuse thickening of the endometrium. There punctate peripheral calcifications. Right ovary Measurements: The right ovary could not be visualized. Left ovary Measurements: The left ovary could not be visualized. Other findings No abnormal free fluid. IMPRESSION: Markedly thickened endometrium without a discrete mass. In the setting of post-menopausal bleeding, endometrial sampling is indicated to exclude carcinoma. If results are benign, sonohysterogram should be considered for focal lesion work-up. (Ref: Radiological Reasoning: Algorithmic Workup of Abnormal Vaginal Bleeding with Endovaginal Sonography and Sonohysterography. AJR 2008; [id number]) Nonvisualization of the ovaries. No adnexal mass observed. No free pelvic fluid. Electronically Signed   By: David  Martinique M.D.   On: 07/14/2016 12:54     Assessment/Plan 1. Edema of both legs I have had a long discussion with the patient regarding swelling and why it  causes symptoms.  Patient will begin wearing graduated compression stockings class 1 (20-30 mmHg) on a daily basis a prescription was given. The patient will  beginning wearing the stockings first thing in the morning and removing them in the evening. The patient is instructed specifically not to sleep in the stockings.   In addition, behavioral modification will be initiated.  This will include frequent elevation, use of over the counter pain medications and exercise such as walking.  I have reviewed systemic causes for chronic edema such as liver, kidney and cardiac etiologies.  The patient denies problems with these organ systems.    Consideration for a lymph pump will also be made based upon the effectiveness of conservative therapy.  This would help to improve the edema control and prevent sequela such as ulcers and infections   Patient should undergo duplex ultrasound of the venous system to ensure that DVT or reflux is not present.  She should also have ABI's done given the poorly palpable pedal pulses  The patient will follow-up with me after the ultrasound.   - VAS [place] LOWER EXTREMITY VENOUS (DVT); Future  2. Lymphedema See #1  3. Chronic venous insufficiency See #1  4. PAD (peripheral artery disease) (HCC)  Recommend:  The patient has evidence of atherosclerosis of the lower extremities with claudication.  The patient does not voice lifestyle limiting changes at this point in time.  Noninvasive will be obtained  No invasive studies, angiography or surgery at this time The patient should continue walking and begin a more formal exercise program.  The patient should continue antiplatelet therapy and aggressive treatment of the lipid abnormalities  No changes in the patient's medications at this time  The patient should continue wearing graduated  compression socks 10-15 mmHg strength to control the mild edema.   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 History of Hodgkin's lymphoma Plan per Oncology    Hortencia Pilar, MD  07/20/2016 5:15 PM

## 2016-07-22 ENCOUNTER — Encounter: Payer: Self-pay | Admitting: Obstetrics and Gynecology

## 2016-07-22 ENCOUNTER — Inpatient Hospital Stay: Payer: PPO | Attending: Obstetrics and Gynecology | Admitting: Obstetrics and Gynecology

## 2016-07-22 VITALS — BP 108/73 | HR 67 | Temp 97.0°F | Resp 18 | Ht 61.75 in | Wt 173.7 lb

## 2016-07-22 DIAGNOSIS — I890 Lymphedema, not elsewhere classified: Secondary | ICD-10-CM | POA: Diagnosis not present

## 2016-07-22 DIAGNOSIS — Z923 Personal history of irradiation: Secondary | ICD-10-CM | POA: Insufficient documentation

## 2016-07-22 DIAGNOSIS — Z955 Presence of coronary angioplasty implant and graft: Secondary | ICD-10-CM | POA: Insufficient documentation

## 2016-07-22 DIAGNOSIS — Z6834 Body mass index (BMI) 34.0-34.9, adult: Secondary | ICD-10-CM | POA: Insufficient documentation

## 2016-07-22 DIAGNOSIS — Z79899 Other long term (current) drug therapy: Secondary | ICD-10-CM | POA: Insufficient documentation

## 2016-07-22 DIAGNOSIS — Z7982 Long term (current) use of aspirin: Secondary | ICD-10-CM | POA: Diagnosis not present

## 2016-07-22 DIAGNOSIS — C541 Malignant neoplasm of endometrium: Secondary | ICD-10-CM

## 2016-07-22 DIAGNOSIS — Z86010 Personal history of colonic polyps: Secondary | ICD-10-CM | POA: Insufficient documentation

## 2016-07-22 DIAGNOSIS — I2510 Atherosclerotic heart disease of native coronary artery without angina pectoris: Secondary | ICD-10-CM | POA: Diagnosis not present

## 2016-07-22 DIAGNOSIS — E785 Hyperlipidemia, unspecified: Secondary | ICD-10-CM

## 2016-07-22 DIAGNOSIS — I10 Essential (primary) hypertension: Secondary | ICD-10-CM

## 2016-07-22 DIAGNOSIS — R42 Dizziness and giddiness: Secondary | ICD-10-CM | POA: Insufficient documentation

## 2016-07-22 DIAGNOSIS — I872 Venous insufficiency (chronic) (peripheral): Secondary | ICD-10-CM | POA: Insufficient documentation

## 2016-07-22 DIAGNOSIS — H9312 Tinnitus, left ear: Secondary | ICD-10-CM | POA: Diagnosis not present

## 2016-07-22 DIAGNOSIS — Z87891 Personal history of nicotine dependence: Secondary | ICD-10-CM | POA: Diagnosis not present

## 2016-07-22 DIAGNOSIS — Z9221 Personal history of antineoplastic chemotherapy: Secondary | ICD-10-CM | POA: Insufficient documentation

## 2016-07-22 DIAGNOSIS — R1310 Dysphagia, unspecified: Secondary | ICD-10-CM | POA: Insufficient documentation

## 2016-07-22 DIAGNOSIS — E669 Obesity, unspecified: Secondary | ICD-10-CM | POA: Insufficient documentation

## 2016-07-22 DIAGNOSIS — Z8571 Personal history of Hodgkin lymphoma: Secondary | ICD-10-CM | POA: Diagnosis not present

## 2016-07-22 DIAGNOSIS — G629 Polyneuropathy, unspecified: Secondary | ICD-10-CM | POA: Diagnosis not present

## 2016-07-22 DIAGNOSIS — M1990 Unspecified osteoarthritis, unspecified site: Secondary | ICD-10-CM | POA: Diagnosis not present

## 2016-07-22 DIAGNOSIS — E282 Polycystic ovarian syndrome: Secondary | ICD-10-CM | POA: Insufficient documentation

## 2016-07-22 DIAGNOSIS — N951 Menopausal and female climacteric states: Secondary | ICD-10-CM | POA: Diagnosis not present

## 2016-07-22 NOTE — Progress Notes (Signed)
  Oncology Nurse Navigator Documentation Chaperoned pelvic exam. Surgical teaching completed. Post op appt made for 5 weeks with Dr. [name]. Will schedule surgery for 2/14 with Dr. [name]. Navigator Location: [facility] (07/22/16 1200)   )Navigator Encounter Type: Initial GynOnc (07/22/16 1200)                     Patient Visit Type: GynOnc (07/22/16 1200)   Barriers/Navigation Needs: Coordination of Care (07/22/16 1200)   Interventions: Coordination of Care (07/22/16 1200)            Acuity: Level 2 (07/22/16 1200)   Acuity Level 2: Educational needs;Initial guidance, education and coordination as needed;Ongoing guidance and education throughout treatment as needed (07/22/16 1200)     Time Spent with Patient: 60 (07/22/16 1200)

## 2016-07-22 NOTE — Patient Instructions (Signed)
Laparoscopic Hysterectomy, Care After Refer to this sheet in the next few weeks. These instructions provide you with information on caring for yourself after your procedure. Your health care provider may also give you more specific instructions. Your treatment has been planned according to current medical practices, but problems sometimes occur. Call your health care provider if you have any problems or questions after your procedure. What can I expect after the procedure?  Pain and bruising at the incision sites. You will be given pain medicine to control it.  Menopausal symptoms such as hot flashes, night sweats, and insomnia if your ovaries were removed.  Sore throat from the breathing tube that was inserted during surgery. Follow these instructions at home:  Only take over-the-counter or prescription medicines for pain, discomfort, or fever as directed by your health care provider.  Do not take aspirin. It can cause bleeding.  Do not drive when taking pain medicine.  Follow your health care provider's advice regarding diet, exercise, lifting, driving, and general activities.  Resume your usual diet as directed and allowed.  Get plenty of rest and sleep.  Do not douche, use tampons, or have sexual intercourse for at least 6 weeks, or until your health care provider gives you permission.  Change your bandages (dressings) as directed by your health care provider.  Monitor your temperature and notify your health care provider of a fever.  Take showers instead of baths for 2-3 weeks.  Do not drink alcohol until your health care provider gives you permission.  If you develop constipation, you may take a mild laxative with your health care provider's permission. Bran foods may help with constipation problems. Drinking enough fluids to keep your urine clear or pale yellow may help as well.  Try to have someone home with you for 1-2 weeks to help around the house.  Keep all of your  follow-up appointments as directed by your health care provider. Contact a health care provider if:  You have swelling, redness, or increasing pain around your incision sites.  You have pus coming from your incision.  You notice a bad smell coming from your incision.  Your incision breaks open.  You feel dizzy or lightheaded.  You have pain or bleeding when you urinate.  You have persistent diarrhea.  You have persistent nausea and vomiting.  You have abnormal vaginal discharge.  You have a rash.  You have any type of abnormal reaction or develop an allergy to your medicine.  You have poor pain control with your prescribed medicine. Get help right away if:  You have chest pain or shortness of breath.  You have severe abdominal pain that is not relieved with pain medicine.  You have pain or swelling in your legs. This information is not intended to replace advice given to you by your health care provider. Make sure you discuss any questions you have with your health care provider. Document Released: 03/22/2013 Document Revised: 11/07/2015 Document Reviewed: 12/20/2012 [employer] Interactive Patient Education  2017 [employer]       Clear Liquid Diet for GYN Oncology Patients Day Before Surgery The day before your scheduled surgery DO NOT EAT any solid foods.  We do want you to drink enough liquids, but NO MILK products.  We do not want you to be dehydrated.  Clear liquids are defined as no milk products and no pieces of any solid food. The following are all approved for you to drink the day before you surgery.  Chicken, Beef   or Vegetable Broth (bouillon or consomm) - NO BROTH AFTER MIDNIGHT  Plain Jello  (no fruit)  Water  Strained lemonade or fruit punch  Gatorade (any flavor)  CLEAR Ensure or Boost Breeze  Fruit juices without pulp, such as apple, grape, or cranberry juice  Clear sodas - NO SODA AFTER MIDNIGHT  Ice Pops without bits of fruit or fruit  pulp  Honey  Tea or coffee without milk or cream Any foods not on the above list should be avoided.                                                                               DIVISION OF GYNECOLOGIC ONCOLOGY BOWEL PREP   The following instructions are extremely important to prepare for your surgery. Please follow them carefully   Step 1: Liquid Diet Instructions   The day before surgery, drink ONLY CLEAR LIQUIDS for breakfast, lunch, dinner and throughout the day.  Drink at least 64 oz of fluid.             CLEAR LIQUID EXAMPLES:             Beef, chicken or vegetable broth, sodas, coffee, tea (sugar, lemon             artificial sweeteners, honey are acceptable), juices (apple, grape, cranberry, any    mixture of clear juices). Kool-Aid, Gatorade, Jell-o (without fruit), popsicles                          NO MILK, MILK PRODUCTS, NON-DAIRY CREAMERS    Step 2: Laxatives           The evening before surgery:   Time: around 5pm   Follow these instructions carefully.   Administer 1 Dulcolax suppository according to manufacturer instructions on the box. You will need to purchase this laxative at a pharmacy or grocery store.     Individual responses to laxatives vary; this prep may cause multiple bowel movements. It often works in 30 minutes and may take as long as 3 hours. Stay near an available bathroom.    It is important to stay hydrated. Ensure you are still drinking clear liquids.      IMPORTANT: FOR YOUR SAFETY, WE WILL HAVE TO CANCEL YOUR SURGERY IF YOU DO NOT FOLLOW THESE INSTRUCTIONS.    Do not eat anything after midnight (including gum or candy) prior to your surgery.  Avoid drinking carbonated beverages after midnight.  You can have clear liquids up until one hour before you arrive at the hospital. "Nothing by mouth" means no liquids, gum, candy, etc for one hour before your arrival time.                                              Bowel Symptoms After  Surgery After gynecologic surgery, women often have temporary changes in bowel function (constipation and gas pain).  Following are tips to help prevent and treat common bowel problems.  It also tells you when to call the doctor.  This is   important because some symptoms might be a sign of a more serious bowel problem such as obstruction (bowel blockage).  These problems are rare but can happen after gynecologic surgery.   Besides surgery, what can temporarily affect bowel function? 1. Dietary changes   2. Decreased physical activity   3.Antibiotics   4. Pain medication   How can I prevent constipation (three days or more without a stool)? 1. Include fiber in your diet: whole grains, raw or dried fruits & vegetables, prunes, prune/pear juiceDrink at least 8 glasses of liquid (preferably water) every day 2. Avoid: ? Gas forming foods such as broccoli, beans, peas, salads, cabbage, sweet potatoes ? Greasy, fatty, or fried foods 3. Activity helps bowel function return to normal, walk around the house at least 3-4 times each day for 15 minutes or longer, if tolerated.  Rocking in a rocking chair is preferable to sitting still. 4. Stool softeners: these are not laxatives, but serve to soften the stool to avoid straining.  Take 2-4 times a day until normal bowel function returns         Examples: Colace or generic equivalent (Docusafe) 5. Bulk laxatives: provide a concentrated source of fiber.  They do not stimulate the bowel.  Take 1-2 times each day until normal bowel function return.              Examples: Citrucel, Metamucil, Fiberal, Fibercon   What can I take for "Gas Pains"? 1. Simethicone (Mylicon, Gas-X, Maalox-Gas, Mylanta-Gas) take 3-4 times a day 2. Maalox Regular - take 3-4 times a day 3. Mylanta Regular - take 3-4 times a day   What can I take if I become constipated? 1. Start with stool softeners and add additional laxatives below as needed to have a bowel movement every 1-2 days   2. Stool softeners 1-2 tablets, 2 times a day 3. Senakot 1-2 tablets, 1-2 times a day 4. Glycerin suppository can soften hard stool take once a day 5. Bisacodyl suppository once a day  6. Milk of Magnesia 30 mL 1-2 times a day 7. Fleets or tap water enema    What can I do for nausea?  1. Limit most solid foods for 24-48 hours 2. Continue eating small frequent amounts of liquids and/or bland soft foods ? Toast, crackers, cooked cereal (grits, cream of wheat, rice) 3. Benadryl: a mild anti-nausea medicine can be obtained without a prescription. May cause drowsiness, especially if taken with narcotic pain medicines 4. Contact provider for prescription nausea medication     What can I do, or take for diarrhea (more than five loose stools per day)? 1. Drink plenty of clear fluids to prevent dehydration 2. May take Kaopectate, Pepto-Bismol, Immodium, or probiotics for 1-2 days 3. Annusol or Preparation-H can be helpful for hemorrhoids and irritated tissue around anus   When should I call the doctor?             CONSTIPATION:   Not relieved after three days following the above program VOMITING:  That contains blood, "coffee ground" material  More the three times/hour and unable to keep down nausea medication for more than eight hours  With dry mouth, dark or strong urine, feeling light-headed, dizzy, or confused  With severe abdominal pain or bloating for more than 24 hours DIARRHEA:  That continues for more then 24-48 hours despite treatment  That contains blood or tarry material  With dry mouth, dark or strong urine, feeling light~headed, dizzy, or confused FEVER:  101   F or higher along with nausea, vomiting, gas pain, diarrhea UNABLE TO:  Pass gas from rectum for more than 24 hours  Tolerate liquids by mouth for more than 24 hours              Laparoscopy Laparoscopy is a procedure to diagnose diseases in the abdomen. During the procedure, a thin, lighted,  pencil-sized instrument called a laparoscope is inserted into the abdomen through an incision. The laparoscope allows your health care provider to look at the organs inside your body. LET YOUR HEALTH CARE PROVIDER KNOW ABOUT:  Any allergies you have.  All medicines you are taking, including vitamins, herbs, eye drops, creams, and over-the-counter medicines.  Previous problems you or members of your family have had with the use of anesthetics.  Any blood disorders you have.  Previous surgeries you have had.  Medical conditions you have. RISKS AND COMPLICATIONS  Generally, this is a safe procedure. However, problems can occur, which may include:  Infection.  Bleeding.  Damage to other organs.  Allergic reaction to the anesthetics used during the procedure. BEFORE THE PROCEDURE  Do not eat or drink anything after midnight on the night before the procedure or as directed by your health care provider.  Ask your health care provider about: ? Changing or stopping your regular medicines. ? Taking medicines such as aspirin and ibuprofen. These medicines can thin your blood. Do not take these medicines before your procedure if your health care provider instructs you not to.  Plan to have someone take you home after the procedure. PROCEDURE  You may be given a medicine to help you relax (sedative).  You will be given a medicine to make you sleep (general anesthetic).  Your abdomen will be inflated with a gas. This will make your organs easier to see.  Small incisions will be made in your abdomen.  A laparoscope and other small instruments will be inserted into the abdomen through the incisions.  A tissue sample may be removed from an organ in the abdomen for examination.  The instruments will be removed from the abdomen.  The gas will be released.  The incisions will be closed with stitches (sutures). AFTER THE PROCEDURE  Your blood pressure, heart rate, breathing rate, and  blood oxygen level will be monitored often until the medicines you were given have worn off.   This information is not intended to replace advice given to you by your health care provider. Make sure you discuss any questions you have with your health care provider.   c.    Laparoscopy, Care After Refer to this sheet in the next few weeks. These instructions provide you with information about caring for yourself after your procedure. Your health care provider may also give you more specific instructions. Your treatment has been planned according to current medical practices, but problems sometimes occur. Call your health care provider if you have any problems or questions after your procedure. WHAT TO EXPECT AFTER THE PROCEDURE After your procedure, it is common to have mild discomfort in the throat and abdomen. HOME CARE INSTRUCTIONS  Take over-the-counter and prescription medicines only as told by your health care provider.  Do not drive for 24 hours if you received a sedative.  Return to your normal activities as told by your health care provider.  Do not take baths, swim, or use a hot tub until your health care provider approves. You may shower.  Follow instructions from your health care provider about how   to take care of your incision. Make sure you: ? Wash your hands with soap and water before you change your bandage (dressing). If soap and water are not available, use hand sanitizer. ? Change your dressing as told by your health care provider. ? Leave stitches (sutures), skin glue, or adhesive strips in place. These skin closures may need to stay in place for 2 weeks or longer. If adhesive strip edges start to loosen and curl up, you may trim the loose edges. Do not remove adhesive strips completely unless your health care provider tells you to do that.  Check your incision area every day for signs of infection. Check for: ? More redness, swelling, or pain. ? More fluid or  blood. ? Warmth. ? Pus or a bad smell.  It is your responsibility to get the results of your procedure. Ask your health care provider or the department performing the procedure when your results will be ready. SEEK MEDICAL CARE IF:  There is new pain in your shoulders.  You feel light-headed or faint.  You are unable to pass gas or unable to have a bowel movement.  You feel nauseous or you vomit.  You develop a rash.  You have more redness, swelling, or pain around your incision.  You have more fluid or blood coming from your incision.  Your incision feels warm to the touch.  You have pus or a bad smell coming from your incision.  You have a fever or chills. SEEK IMMEDIATE MEDICAL CARE IF:  Your pain is getting worse.  You have ongoing vomiting.  The edges of your incision open up.  You have trouble breathing.  You have chest pain.   This information is not intended to replace advice given to you by your health care provider. Make sure you discuss any questions you have with your health care provider.                   

## 2016-07-22 NOTE — Progress Notes (Signed)
Gynecologic Oncology Consult Visit   Referring Provider: Dr. Servando Salina  Chief Concern: Grade 1 endometrial cancer  Subjective:  Jaime Hall is a 66 y.o. G2P2 female who is seen in consultation from [name] and Garwin Brothers for grade 1 endometrial cancer.  History of Hodgkin's Lymphoma 2011 treated successfully at [facility] with chemo and RT.  Menopause age 89 and no hormone use.  Bladder sling for SUI 2009.  Benign cervical polyp removed 5/16.  Developed large amount of postmenopausal bleeding for two weeks in January, and sonohysterogram showed 2.5 cm fundal lesion.  Hct 41%.  PAP/HPV normal.  Endometrial biopsy done 07/15/16 showed grade 1 endometrial cancer. Now only light bleeding.  Has some leg edema due to chronic venous insufficiency.  Had angioplasty and cardiac stents in 2013 and was on Plavix for three years, now just ASA.  No chest pain and lives alone and works and is highly functional.  Took Tamiflu last week of January after being diagnosed with flu at urgent care center, but this has resolved.   Works as Dealer.    Has some ringing in ears and left inner ear dizziness and night sweats.  Problem List: Patient Active Problem List   Diagnosis Date Noted  . Lymphedema 07/20/2016  . Chronic venous insufficiency 07/20/2016  . PAD (peripheral artery disease) ([place]) 07/20/2016  . Postmenopausal bleeding 07/14/2016  . Influenza 07/14/2016  . Class 1 obesity due to excess calories without serious comorbidity with body mass index (BMI) of 34.0 to 34.9 in adult 05/27/2016  . Chronic cough 12/19/2015  . Pre-syncope 12/19/2015  . Hx of colonic polyps   . Benign neoplasm of sigmoid colon   . Dysphagia, pharyngoesophageal phase 10/01/2014  . Prolapsed, uterovaginal, incomplete 06/24/2014  . Routine general medical examination at a health care facility 12/30/2012  . Obesity (BMI 30-39.9) 12/30/2012  . Hx of multiple pulmonary nodules 12/28/2012  . Plantar  fasciitis of right foot 12/28/2012  . 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 Edema of both legs 09/12/2012  . Hip pain, bilateral 09/12/2012  . History of Hodgkin's lymphoma 09/12/2012  . Coronary artery disease   . Hyperlipidemia   . Chest pain on exertion 02/29/2012    Past Medical History: Past Medical History:  Diagnosis Date  . Cervicalgia   . Coronary artery disease 02/2012   Abnormal stress test with anterior wall ischemia. Cardiac catheterization showed a 95% stenosis in the proximal LAD bifurcating with a 90% stenosis in first diagonal. Ejection fraction was 45% with anterior wall hypokinesis. She underwent balloon angioplasty to ostial first diagonal and drug-eluting stent placement to proximal LAD with a 3.0 x 15 mm Xience EX drug-eluting stent  . Essential hypertension, benign   . Fibrocystic breast disease   . Gestational hypertension   . Heart disease   . Heart murmur   . History of anemia   . History of blood transfusion   . Hodgkin's lymphoma ([place]) 2011   s/p radiation and chemo therapy  . Osteoarthritis   . Polycystic ovarian disease     Past Surgical History: Past Surgical History:  Procedure Laterality Date  . bladder sling    . CARDIAC CATHETERIZATION  02/2012   [facility]  . CERVICAL POLYPECTOMY    . CHOLECYSTECTOMY  1982  . COLONOSCOPY WITH PROPOFOL N/A 02/05/2015   Procedure: COLONOSCOPY WITH PROPOFOL;  Surgeon: Lucilla Lame, MD;  Location: [facility] ENDOSCOPY;  Service: Endoscopy;  Laterality: N/A;  . CORONARY ANGIOPLASTY  02/2012  left/right s/p balloon  . heart stent'  2013  . transobturator sling N/A 2009   [place]    Family History: Family History  Problem Relation Age of Onset  . ALS Father   . Polymyositis Father   . Diabetes Brother   . Cancer Maternal Aunt     breast  . Breast cancer Maternal Aunt     30's  . Stroke Maternal Grandmother   . Cancer Maternal Grandfather     prostate  . Stroke Maternal Grandfather      Social History: Social History   Social History  . Marital status: Married    Spouse name: N/A  . Number of children: N/A  . Years of education: N/A   Occupational History  . Not on file.   Social History Main Topics  . Smoking status: Former Smoker    Packs/day: 1.00    Years: 30.00    Types: Cigarettes  . Smokeless tobacco: Never Used     Comment: quit smoking in 2000  . Alcohol use Yes     Comment: occasional  . Drug use: No  . Sexual activity: No   Other Topics Concern  . Not on file   Social History Narrative  . No narrative on file    Allergies: Allergies  Allergen Reactions  . Z-Pak [Azithromycin] Itching  . Antifungal [Miconazole Nitrate] Rash  . Sulfa Antibiotics Nausea And Vomiting and Rash    N/T    Current Medications: Current Outpatient Prescriptions  Medication Sig Dispense Refill  . aspirin 81 MG tablet Take 81 mg by mouth daily.    Marland Kitchen atorvastatin (LIPITOR) 10 MG tablet TAKE ONE TABLET BY MOUTH ONCE DAILY 90 tablet 3  . B Complex Vitamins (B COMPLEX 1 PO) Take 1 tablet by mouth daily.    . benzonatate (TESSALON) 100 MG capsule Take 100 mg by mouth 3 (three) times daily as needed for cough.    Marland Kitchen BIOTIN PO Take by mouth daily. Reported on 11/22/2015    . Cholecalciferol (VITAMIN D) 2000 UNITS tablet Take 2,000 Units by mouth daily. Reported on 12/18/2015    . Cyanocobalamin (VITAMIN B-12) 1000 MCG SUBL Place 1 tablet under the tongue daily.    . diazepam (VALIUM) 5 MG tablet Take 1 tablet (5 mg total) by mouth every 12 (twelve) hours as needed for anxiety. 30 tablet 1  . docusate (COLACE) 50 MG/5ML liquid 2-3 drops in each ear nightly 100 mL 0  . esomeprazole (NEXIUM) 20 MG capsule Take 20 mg by mouth daily at 12 noon.    . lansoprazole (PREVACID) 30 MG capsule Take 1 capsule (30 mg total) by mouth daily at 12 noon. 30 capsule 10  . metoprolol tartrate (LOPRESSOR) 25 MG tablet TAKE ONE TABLET BY MOUTH TWICE DAILY 180 tablet 2  . nitroGLYCERIN  (NITROSTAT) 0.4 MG SL tablet Place 1 tablet (0.4 mg total) under the tongue every 5 (five) minutes as needed for chest pain. 25 tablet 3  . TURMERIC PO Take by mouth.     No current facility-administered medications for this visit.     Review of Systems General: negative for, fevers, chills, fatigue, changes in sleep, changes in weight or appetite Skin: negative for changes in color, texture, moles or lesions Eyes: negative for, changes in vision, pain, diplopia HEENT: negative for, change in hearing, pain, discharge, tinnitus, vertigo, voice changes, sore throat, neck masses Breasts: negative for breast lumps Pulmonary: negative for, dyspnea, orthopnea, productive cough Cardiac: negative for, palpitations,  syncope, pain, discomfort, pressure Gastrointestinal: negative for, dysphagia, nausea, vomiting, jaundice, pain, constipation, diarrhea, hematemesis, hematochezia Genitourinary/Sexual: negative for, dysuria, discharge, hesitancy, nocturia, retention, stones, infections, STD's, incontinence Ob/Gyn: negative for, pain Musculoskeletal: negative for, pain, stiffness, swelling, range of motion limitation Hematology: negative for, easy bruising Neurologic/Psych: negative for, headaches, seizures, paralysis, weakness, tremor, change in gait, change in sensation, mood swings, depression, anxiety, change in memory  Objective:  Physical Examination:  BP 108/73   Pulse 67   Temp 97 F (36.1 C) (Tympanic)   Resp 18   Ht 5' 1.75" (1.568 m)   Wt 173 lb 11.6 oz (78.8 kg)   BMI 32.03 kg/m    ECOG Performance Status: 1 - Symptomatic but completely ambulatory  General appearance: alert, cooperative and appears stated age HEENT:PERRLA, neck supple with midline trachea and thyroid without masses Lymph node survey: non-palpable, axillary, inguinal, supraclavicular Cardiovascular: regular rate and rhythm, no murmurs or gallops Respiratory: normal air entry, lungs clear to auscultation and no  rales, rhonchi or wheezing Breast exam: not examined. Abdomen: soft, non-tender, without masses or organomegaly, no hernias and well healed incision Back: inspection of back is normal Extremities: mild venous insufficiency changes with superficial varices. Skin exam - normal coloration and turgor, no rashes, no suspicious skin lesions noted. Neurological exam reveals alert, oriented, normal speech, no focal findings or movement disorder noted.  Pelvic: exam chaperoned by nurse;  Vulva: normal appearing vulva with no masses, tenderness or lesions; Vagina: normal vagina; Adnexa: normal adnexa in size, nontender and no masses; Uterus: uterus is normal size, shape, consistency and nontender; Cervix: anteverted; Rectal: normal rectal, no masses    Assessment:  Jaime Hall is a 66 y.o. female diagnosed with grade 1 endometrial cancer.   Medical co-morbidities complicating care: 30 pack year former smoker s/p coronary angioplasty and stents 2013 and takes baby ASA.  Plan:   Problem List Items Addressed This Visit    None    Visit Diagnoses    Endometrial cancer ([place])    -  Primary     We discussed options for management for grade 1 endometrial cancer including robotic TLH, BSO with sentinel lymph node mapping and biopsies and possible pelvic lymphadenectomy if nodes do not map with Dr [name] 07/29/16. Will ask Dr Leonides Schanz to assist.  The risks of surgery were discussed in detail and she understands these to include infection; cervical incompetence; failure to remove the entire affected area; recurrence of dysplasia; injury to adjacent organs such as bowel, bladder, blood vessels, ureters, vagina, and nerves; bleeding which may require blood transfusion; anesthesia risk; thromboembolic events; possible death; unforeseen complications; possible need for further procedures; medical complications such as heart attack, stroke, pleural effusion and pneumonia.  The patient will receive DVT and antibiotic  prophylaxis as indicated.  She voiced a clear understanding.  She had the opportunity to ask questions and written informed consent was obtained today.  The risks of surgery were discussed in detail and she understands these to include infection; wound separation; hernia; vaginal cuff separation, injury to adjacent organs such as bowel, bladder, blood vessels, ureters and nerves; bleeding which may require blood transfusion; anesthesia risk; thromboembolic events; possible death; unforeseen complications; possible need for re-exploration; medical complications such as heart attack, stroke, pleural effusion and pneumonia; and, if staging performed the risk of lymphedema and lymphocyst.  The patient will receive DVT and antibiotic prophylaxis as indicated.  She voiced a clear understanding.  She had the opportunity to ask questions and written  informed consent was obtained today.  The patient's diagnosis, an outline of the further diagnostic and laboratory studies which will be required, the recommendation, and alternatives were discussed.  All questions were answered to the patient's satisfaction.  A total of 60 minutes were spent with the patient/family today; 30% was spent in education, counseling and coordination of care for endometrial cancer.    Mellody Drown, MD  CC:  [name], MD [place], [place] [address] [phone]

## 2016-07-22 NOTE — Progress Notes (Signed)
Pt has spotting yest. But none today. No discomfort.

## 2016-07-24 ENCOUNTER — Ambulatory Visit
Admission: RE | Admit: 2016-07-24 | Discharge: 2016-07-24 | Disposition: A | Discharge status: Home / Self Care | Payer: PPO | Source: Ambulatory Visit | Attending: Obstetrics and Gynecology | Admitting: Obstetrics and Gynecology

## 2016-07-24 ENCOUNTER — Encounter
Admission: RE | Admit: 2016-07-24 | Discharge: 2016-07-24 | Disposition: A | Discharge status: Home / Self Care | Payer: PPO | Source: Ambulatory Visit | Attending: Obstetrics and Gynecology | Admitting: Obstetrics and Gynecology

## 2016-07-24 DIAGNOSIS — Z01812 Encounter for preprocedural laboratory examination: Secondary | ICD-10-CM | POA: Insufficient documentation

## 2016-07-24 DIAGNOSIS — Z01810 Encounter for preprocedural cardiovascular examination: Secondary | ICD-10-CM | POA: Diagnosis not present

## 2016-07-24 DIAGNOSIS — Z01818 Encounter for other preprocedural examination: Secondary | ICD-10-CM | POA: Diagnosis not present

## 2016-07-24 DIAGNOSIS — Z01811 Encounter for preprocedural respiratory examination: Secondary | ICD-10-CM

## 2016-07-24 HISTORY — DX: Anxiety disorder, unspecified: F41.9

## 2016-07-24 HISTORY — DX: Gastro-esophageal reflux disease without esophagitis: K21.9

## 2016-07-24 LAB — PROTIME-INR
INR: 0.92
Prothrombin Time: 12.3 seconds (ref 11.4–15.2)

## 2016-07-24 LAB — URINALYSIS, ROUTINE W REFLEX MICROSCOPIC
Bacteria, UA: NONE SEEN
Bilirubin Urine: NEGATIVE
Glucose, UA: NEGATIVE mg/dL
Hgb urine dipstick: NEGATIVE
Ketones, ur: NEGATIVE mg/dL
Nitrite: NEGATIVE
Protein, ur: NEGATIVE mg/dL
Specific Gravity, Urine: 1.026 (ref 1.005–1.030)
pH: 5 (ref 5.0–8.0)

## 2016-07-24 LAB — TYPE AND SCREEN
ABO/RH(D): O POS
Antibody Screen: NEGATIVE

## 2016-07-24 LAB — CBC WITH DIFFERENTIAL/PLATELET
Basophils Absolute: 0 10*3/uL (ref 0–0.1)
Basophils Relative: 1 %
Eosinophils Absolute: 0.1 10*3/uL (ref 0–0.7)
Eosinophils Relative: 1 %
HCT: 38.4 % (ref 35.0–47.0)
Hemoglobin: 13.3 g/dL (ref 12.0–16.0)
Lymphocytes Relative: 17 %
Lymphs Abs: 1.1 10*3/uL (ref 1.0–3.6)
MCH: 30.3 pg (ref 26.0–34.0)
MCHC: 34.5 g/dL (ref 32.0–36.0)
MCV: 87.7 fL (ref 80.0–100.0)
Monocytes Absolute: 0.5 10*3/uL (ref 0.2–0.9)
Monocytes Relative: 8 %
Neutro Abs: 4.9 10*3/uL (ref 1.4–6.5)
Neutrophils Relative %: 73 %
Platelets: 250 10*3/uL (ref 150–440)
RBC: 4.38 MIL/uL (ref 3.80–5.20)
RDW: 14.2 % (ref 11.5–14.5)
WBC: 6.6 10*3/uL (ref 3.6–11.0)

## 2016-07-24 LAB — COMPREHENSIVE METABOLIC PANEL
ALT: 27 U/L (ref 14–54)
AST: 26 U/L (ref 15–41)
Albumin: 4.1 g/dL (ref 3.5–5.0)
Alkaline Phosphatase: 65 U/L (ref 38–126)
Anion gap: 6 (ref 5–15)
BUN: 24 mg/dL — ABNORMAL HIGH (ref 6–20)
CO2: 30 mmol/L (ref 22–32)
Calcium: 9.2 mg/dL (ref 8.9–10.3)
Chloride: 108 mmol/L (ref 101–111)
Creatinine, Ser: 1.29 mg/dL — ABNORMAL HIGH (ref 0.44–1.00)
GFR calc Af Amer: 49 mL/min — ABNORMAL LOW (ref >60)
GFR calc non Af Amer: 42 mL/min — ABNORMAL LOW (ref >60)
Glucose, Bld: 75 mg/dL (ref 65–99)
Potassium: 3.7 mmol/L (ref 3.5–5.1)
Sodium: 144 mmol/L (ref 135–145)
Total Bilirubin: 1.1 mg/dL (ref 0.3–1.2)
Total Protein: 6.9 g/dL (ref 6.5–8.1)

## 2016-07-24 LAB — APTT: aPTT: 26 seconds (ref 24–36)

## 2016-07-24 NOTE — Progress Notes (Signed)
Pt reports she had the flu last week, Feb. 2, 2018.  Fever broke on  07/20/2016.  Pt has gone back to work on Tuesday Feb. 6, 2018.

## 2016-07-24 NOTE — Patient Instructions (Signed)
  Your procedure is scheduled on:Wednesday Feb 14 , 2018. Report to Same Day Surgery. To find out your arrival time please call [phone] between 1PM - 3PM on Tuesday Feb. 13, 2018.  Remember: Instructions that are not followed completely may result in serious medical risk, up to and including death, or upon the discretion of your surgeon and anesthesiologist your surgery may need to be rescheduled.    _x___ 1. Do not eat food or drink liquids after midnight. No gum chewing or hard candies.     _x___ 2. No Alcohol for 24 hours before or after surgery.   ____ 3. Bring all medications with you on the day of surgery if instructed.    __x__ 4. Notify your doctor if there is any change in your medical condition     (cold, fever, infections).    _____ 5. No smoking 24 hours prior to surgery.     Do not wear jewelry, make-up, hairpins, clips or nail polish.  Do not wear lotions, powders, or perfumes.   Do not shave 48 hours prior to surgery. Men may shave face and neck.  Do not bring valuables to the hospital.    [facility] is not responsible for any belongings or valuables.               Contacts, dentures or bridgework may not be worn into surgery.  Leave your suitcase in the car. After surgery it may be brought to your room.  For patients admitted to the hospital, discharge time is determined by your treatment team.   Patients discharged the day of surgery will not be allowed to drive home.    Please read over the following fact sheets that you were given:   [facility] Preparing for Surgery  _x___ Take these medicines the morning of surgery with A SIP OF WATER:    1. esomeprazole (NEXIUM)   2. metoprolol tartrate (LOPRESSOR)     ____ Fleet Enema (as directed)   _x___ Use CHG Soap as directed on instruction sheet  ____ Use inhalers on the day of surgery and bring to hospital day of surgery  ____ Stop metformin 2 days prior to surgery    ____ Take 1/2 of usual insulin  dose the night before surgery and none on the morning of surgery.   _x___ Aspirin has already been stopped.  _x___ Stop Anti-inflammatories such as Advil, Aleve, Ibuprofen, Motrin, Naproxen,  Naprosyn, Goodies powders or aspirin products. OK to take Tylenol.   _x___ Stop supplements: b complex vitamins, vitamin B-12 (CYANOCOBALAMIN), Turmeric  until after surgery.    ____ Bring C-Pap to the hospital.

## 2016-07-29 ENCOUNTER — Inpatient Hospital Stay: Payer: PPO | Admitting: Registered Nurse

## 2016-07-29 ENCOUNTER — Encounter
Admission: RE | Disposition: A | Discharge status: Home / Self Care | Payer: Self-pay | Source: Ambulatory Visit | Attending: Obstetrics and Gynecology

## 2016-07-29 ENCOUNTER — Ambulatory Visit
Admission: RE | Admit: 2016-07-29 | Discharge: 2016-07-29 | Disposition: A | Discharge status: Home / Self Care | Payer: PPO | Source: Ambulatory Visit | Attending: Obstetrics and Gynecology | Admitting: Obstetrics and Gynecology

## 2016-07-29 DIAGNOSIS — Z87891 Personal history of nicotine dependence: Secondary | ICD-10-CM | POA: Insufficient documentation

## 2016-07-29 DIAGNOSIS — I10 Essential (primary) hypertension: Secondary | ICD-10-CM | POA: Diagnosis not present

## 2016-07-29 DIAGNOSIS — Z6831 Body mass index (BMI) 31.0-31.9, adult: Secondary | ICD-10-CM | POA: Insufficient documentation

## 2016-07-29 DIAGNOSIS — K219 Gastro-esophageal reflux disease without esophagitis: Secondary | ICD-10-CM | POA: Insufficient documentation

## 2016-07-29 DIAGNOSIS — Z9889 Other specified postprocedural states: Secondary | ICD-10-CM | POA: Insufficient documentation

## 2016-07-29 DIAGNOSIS — E669 Obesity, unspecified: Secondary | ICD-10-CM | POA: Insufficient documentation

## 2016-07-29 DIAGNOSIS — M1990 Unspecified osteoarthritis, unspecified site: Secondary | ICD-10-CM | POA: Diagnosis not present

## 2016-07-29 DIAGNOSIS — I739 Peripheral vascular disease, unspecified: Secondary | ICD-10-CM | POA: Insufficient documentation

## 2016-07-29 DIAGNOSIS — Z882 Allergy status to sulfonamides status: Secondary | ICD-10-CM | POA: Diagnosis not present

## 2016-07-29 DIAGNOSIS — C549 Malignant neoplasm of corpus uteri, unspecified: Secondary | ICD-10-CM | POA: Diagnosis not present

## 2016-07-29 DIAGNOSIS — Z823 Family history of stroke: Secondary | ICD-10-CM | POA: Insufficient documentation

## 2016-07-29 DIAGNOSIS — N854 Malposition of uterus: Secondary | ICD-10-CM | POA: Insufficient documentation

## 2016-07-29 DIAGNOSIS — Z888 Allergy status to other drugs, medicaments and biological substances status: Secondary | ICD-10-CM | POA: Insufficient documentation

## 2016-07-29 DIAGNOSIS — C55 Malignant neoplasm of uterus, part unspecified: Secondary | ICD-10-CM | POA: Diagnosis not present

## 2016-07-29 DIAGNOSIS — Z7982 Long term (current) use of aspirin: Secondary | ICD-10-CM | POA: Diagnosis not present

## 2016-07-29 DIAGNOSIS — Z923 Personal history of irradiation: Secondary | ICD-10-CM | POA: Insufficient documentation

## 2016-07-29 DIAGNOSIS — Z8042 Family history of malignant neoplasm of prostate: Secondary | ICD-10-CM | POA: Insufficient documentation

## 2016-07-29 DIAGNOSIS — I872 Venous insufficiency (chronic) (peripheral): Secondary | ICD-10-CM | POA: Insufficient documentation

## 2016-07-29 DIAGNOSIS — Z955 Presence of coronary angioplasty implant and graft: Secondary | ICD-10-CM | POA: Insufficient documentation

## 2016-07-29 DIAGNOSIS — Z7902 Long term (current) use of antithrombotics/antiplatelets: Secondary | ICD-10-CM | POA: Diagnosis not present

## 2016-07-29 DIAGNOSIS — C541 Malignant neoplasm of endometrium: Secondary | ICD-10-CM | POA: Insufficient documentation

## 2016-07-29 DIAGNOSIS — E785 Hyperlipidemia, unspecified: Secondary | ICD-10-CM | POA: Insufficient documentation

## 2016-07-29 DIAGNOSIS — C542 Malignant neoplasm of myometrium: Secondary | ICD-10-CM | POA: Diagnosis not present

## 2016-07-29 DIAGNOSIS — Z8571 Personal history of Hodgkin lymphoma: Secondary | ICD-10-CM | POA: Diagnosis not present

## 2016-07-29 DIAGNOSIS — Z833 Family history of diabetes mellitus: Secondary | ICD-10-CM | POA: Insufficient documentation

## 2016-07-29 DIAGNOSIS — Z8269 Family history of other diseases of the musculoskeletal system and connective tissue: Secondary | ICD-10-CM | POA: Insufficient documentation

## 2016-07-29 DIAGNOSIS — Z9221 Personal history of antineoplastic chemotherapy: Secondary | ICD-10-CM | POA: Insufficient documentation

## 2016-07-29 DIAGNOSIS — I2510 Atherosclerotic heart disease of native coronary artery without angina pectoris: Secondary | ICD-10-CM | POA: Diagnosis not present

## 2016-07-29 DIAGNOSIS — Z881 Allergy status to other antibiotic agents status: Secondary | ICD-10-CM | POA: Insufficient documentation

## 2016-07-29 DIAGNOSIS — Z803 Family history of malignant neoplasm of breast: Secondary | ICD-10-CM | POA: Insufficient documentation

## 2016-07-29 DIAGNOSIS — K660 Peritoneal adhesions (postprocedural) (postinfection): Secondary | ICD-10-CM | POA: Diagnosis not present

## 2016-07-29 DIAGNOSIS — N736 Female pelvic peritoneal adhesions (postinfective): Secondary | ICD-10-CM

## 2016-07-29 DIAGNOSIS — J449 Chronic obstructive pulmonary disease, unspecified: Secondary | ICD-10-CM | POA: Insufficient documentation

## 2016-07-29 HISTORY — PX: PELVIC LYMPH NODE DISSECTION: SHX6543

## 2016-07-29 HISTORY — PX: SENTINEL NODE BIOPSY: SHX6608

## 2016-07-29 HISTORY — PX: ROBOTIC ASSISTED TOTAL HYSTERECTOMY WITH BILATERAL SALPINGO OOPHERECTOMY: SHX6086

## 2016-07-29 LAB — ABO/RH: ABO/RH(D): O POS

## 2016-07-29 SURGERY — ROBOTIC ASSISTED TOTAL HYSTERECTOMY WITH BILATERAL SALPINGO OOPHORECTOMY
Anesthesia: General

## 2016-07-29 MED ORDER — CEFAZOLIN SODIUM-DEXTROSE 2-4 GM/100ML-% IV SOLN
INTRAVENOUS | Status: AC
  Filled 2016-07-29: qty 100

## 2016-07-29 MED ORDER — FENTANYL CITRATE (PF) 100 MCG/2ML IJ SOLN
INTRAMUSCULAR | Status: DC | PRN
  Administered 2016-07-29 (×3): 50 ug via INTRAVENOUS
  Administered 2016-07-29 (×2): 25 ug via INTRAVENOUS
  Administered 2016-07-29: 50 ug via INTRAVENOUS

## 2016-07-29 MED ORDER — LACTATED RINGERS IV SOLN
INTRAVENOUS | Status: DC
  Administered 2016-07-29: 06:00:00 via INTRAVENOUS

## 2016-07-29 MED ORDER — SUGAMMADEX SODIUM 200 MG/2ML IV SOLN
INTRAVENOUS | Status: AC
  Filled 2016-07-29: qty 2

## 2016-07-29 MED ORDER — ACETAMINOPHEN 10 MG/ML IV SOLN
INTRAVENOUS | Status: DC | PRN
  Administered 2016-07-29: 1000 mg via INTRAVENOUS

## 2016-07-29 MED ORDER — ENOXAPARIN SODIUM 40 MG/0.4ML ~~LOC~~ SOLN
40.0000 mg | Freq: Once | SUBCUTANEOUS | Status: AC
  Administered 2016-07-29: 40 mg via SUBCUTANEOUS
  Filled 2016-07-29: qty 0.4

## 2016-07-29 MED ORDER — PHENYLEPHRINE HCL 10 MG/ML IJ SOLN
INTRAMUSCULAR | Status: AC
  Filled 2016-07-29: qty 1

## 2016-07-29 MED ORDER — SUGAMMADEX SODIUM 200 MG/2ML IV SOLN
INTRAVENOUS | Status: DC | PRN
  Administered 2016-07-29: 150 mg via INTRAVENOUS

## 2016-07-29 MED ORDER — PROPOFOL 10 MG/ML IV BOLUS
INTRAVENOUS | Status: DC | PRN
  Administered 2016-07-29: 150 mg via INTRAVENOUS

## 2016-07-29 MED ORDER — OXYCODONE HCL 5 MG PO TABS: 5.0000 mg | ORAL_TABLET | ORAL | 0 refills | Status: DC | PRN

## 2016-07-29 MED ORDER — GLYCOPYRROLATE 0.2 MG/ML IJ SOLN
INTRAMUSCULAR | Status: DC | PRN
  Administered 2016-07-29: 0.2 mg via INTRAVENOUS

## 2016-07-29 MED ORDER — FENTANYL CITRATE (PF) 100 MCG/2ML IJ SOLN
25.0000 ug | INTRAMUSCULAR | Status: DC | PRN
  Administered 2016-07-29 (×2): 25 ug via INTRAVENOUS

## 2016-07-29 MED ORDER — CHLORHEXIDINE GLUCONATE CLOTH 2 % EX PADS: 6.0000 | MEDICATED_PAD | Freq: Once | CUTANEOUS | Status: DC

## 2016-07-29 MED ORDER — ROCURONIUM BROMIDE 50 MG/5ML IV SOLN
INTRAVENOUS | Status: AC
  Filled 2016-07-29: qty 1

## 2016-07-29 MED ORDER — ONDANSETRON HCL 4 MG/2ML IJ SOLN
INTRAMUSCULAR | Status: AC
  Filled 2016-07-29: qty 2

## 2016-07-29 MED ORDER — EPHEDRINE SULFATE 50 MG/ML IJ SOLN
INTRAMUSCULAR | Status: DC | PRN
  Administered 2016-07-29 (×3): 5 mg via INTRAVENOUS

## 2016-07-29 MED ORDER — CEFAZOLIN SODIUM-DEXTROSE 2-4 GM/100ML-% IV SOLN
2.0000 g | INTRAVENOUS | Status: AC
  Administered 2016-07-29: 2 g via INTRAVENOUS

## 2016-07-29 MED ORDER — OXYCODONE HCL 5 MG PO TABS
ORAL_TABLET | ORAL | Status: AC
  Filled 2016-07-29: qty 1

## 2016-07-29 MED ORDER — FLEET ENEMA 7-19 GM/118ML RE ENEM: 1.0000 | ENEMA | Freq: Once | RECTAL | Status: DC

## 2016-07-29 MED ORDER — FENTANYL CITRATE (PF) 100 MCG/2ML IJ SOLN
INTRAMUSCULAR | Status: AC
  Administered 2016-07-29: 25 ug via INTRAVENOUS
  Filled 2016-07-29: qty 2

## 2016-07-29 MED ORDER — INDOCYANINE GREEN 25 MG IV SOLR
INTRAVENOUS | Status: AC
  Filled 2016-07-29: qty 25

## 2016-07-29 MED ORDER — SEVOFLURANE IN SOLN
RESPIRATORY_TRACT | Status: AC
  Filled 2016-07-29: qty 250

## 2016-07-29 MED ORDER — PHENYLEPHRINE HCL 10 MG/ML IJ SOLN
INTRAMUSCULAR | Status: DC | PRN
  Administered 2016-07-29: 100 ug via INTRAVENOUS

## 2016-07-29 MED ORDER — BUPIVACAINE HCL (PF) 0.5 % IJ SOLN
INTRAMUSCULAR | Status: AC
  Filled 2016-07-29: qty 30

## 2016-07-29 MED ORDER — SUCCINYLCHOLINE CHLORIDE 20 MG/ML IJ SOLN
INTRAMUSCULAR | Status: DC | PRN
  Administered 2016-07-29: 100 mg via INTRAVENOUS

## 2016-07-29 MED ORDER — OXYCODONE HCL 5 MG PO TABS
5.0000 mg | ORAL_TABLET | ORAL | Status: AC | PRN
  Administered 2016-07-29: 5 mg via ORAL
  Filled 2016-07-29: qty 1

## 2016-07-29 MED ORDER — MICROFIBRILLAR COLL HEMOSTAT EX PADS
MEDICATED_PAD | CUTANEOUS | Status: DC | PRN
  Administered 2016-07-29: 2 via TOPICAL

## 2016-07-29 MED ORDER — LIDOCAINE HCL (PF) 2 % IJ SOLN
INTRAMUSCULAR | Status: AC
  Filled 2016-07-29: qty 2

## 2016-07-29 MED ORDER — ROCURONIUM BROMIDE 100 MG/10ML IV SOLN
INTRAVENOUS | Status: DC | PRN
  Administered 2016-07-29: 30 mg via INTRAVENOUS
  Administered 2016-07-29: 5 mg via INTRAVENOUS
  Administered 2016-07-29: 20 mg via INTRAVENOUS

## 2016-07-29 MED ORDER — FENTANYL CITRATE (PF) 250 MCG/5ML IJ SOLN
INTRAMUSCULAR | Status: AC
  Filled 2016-07-29: qty 5

## 2016-07-29 MED ORDER — PROPOFOL 10 MG/ML IV BOLUS
INTRAVENOUS | Status: AC
  Filled 2016-07-29: qty 20

## 2016-07-29 MED ORDER — SODIUM CHLORIDE FLUSH 0.9 % IV SOLN
INTRAVENOUS | Status: AC
  Filled 2016-07-29: qty 10

## 2016-07-29 MED ORDER — GLYCOPYRROLATE 0.2 MG/ML IJ SOLN
INTRAMUSCULAR | Status: AC
  Filled 2016-07-29: qty 1

## 2016-07-29 MED ORDER — ONDANSETRON HCL 4 MG/2ML IJ SOLN
INTRAMUSCULAR | Status: DC | PRN
  Administered 2016-07-29: 4 mg via INTRAVENOUS

## 2016-07-29 MED ORDER — ACETAMINOPHEN 10 MG/ML IV SOLN
INTRAVENOUS | Status: AC
  Filled 2016-07-29: qty 100

## 2016-07-29 MED ORDER — MIDAZOLAM HCL 2 MG/2ML IJ SOLN
INTRAMUSCULAR | Status: DC | PRN
  Administered 2016-07-29: 2 mg via INTRAVENOUS

## 2016-07-29 MED ORDER — ONDANSETRON HCL 4 MG/2ML IJ SOLN: 4.0000 mg | Freq: Once | INTRAMUSCULAR | Status: DC | PRN

## 2016-07-29 MED ORDER — LIDOCAINE HCL (CARDIAC) 20 MG/ML IV SOLN
INTRAVENOUS | Status: DC | PRN
  Administered 2016-07-29: 80 mg via INTRAVENOUS

## 2016-07-29 MED ORDER — SUCCINYLCHOLINE CHLORIDE 200 MG/10ML IV SOSY
PREFILLED_SYRINGE | INTRAVENOUS | Status: AC
  Filled 2016-07-29: qty 10

## 2016-07-29 MED ORDER — MIDAZOLAM HCL 2 MG/2ML IJ SOLN
INTRAMUSCULAR | Status: AC
  Filled 2016-07-29: qty 2

## 2016-07-29 MED ORDER — DEXAMETHASONE SODIUM PHOSPHATE 10 MG/ML IJ SOLN
INTRAMUSCULAR | Status: DC | PRN
  Administered 2016-07-29: 10 mg via INTRAVENOUS

## 2016-07-29 MED ORDER — INDOCYANINE GREEN 25 MG IV SOLR
INTRAVENOUS | Status: DC | PRN
  Administered 2016-07-29: 12.5 mg

## 2016-07-29 SURGICAL SUPPLY — 91 items
BAG URO DRAIN 2000ML W/SPOUT (MISCELLANEOUS) ×2 IMPLANT
BLADE SURG 11 STRL SS SAFETY (MISCELLANEOUS) ×2 IMPLANT
CANISTER SUCT 1200ML W/VALVE (MISCELLANEOUS) ×2 IMPLANT
CANNULA DILATOR 10 W/SLV (CANNULA) ×4 IMPLANT
CATH FOLEY 2WAY  5CC 16FR (CATHETERS) ×1
CATH TRAY 16F METER LATEX (MISCELLANEOUS) IMPLANT
CATH URTH 16FR FL 2W BLN LF (CATHETERS) ×1 IMPLANT
CHLORAPREP W/TINT 26ML (MISCELLANEOUS) ×2 IMPLANT
CNTNR SPEC 2.5X3XGRAD LEK (MISCELLANEOUS) ×1
CONT SPEC 4OZ STER OR WHT (MISCELLANEOUS) ×1
CONTAINER SPEC 2.5X3XGRAD LEK (MISCELLANEOUS) ×1 IMPLANT
CORD BIP STRL DISP 12FT (MISCELLANEOUS) ×2 IMPLANT
CORD MONOPOLAR M/FML 12FT (MISCELLANEOUS) ×2 IMPLANT
COVER TIP SHEARS 8 DVNC (MISCELLANEOUS) ×1 IMPLANT
COVER TIP SHEARS 8MM DA VINCI (MISCELLANEOUS) ×1
DEFOGGER SCOPE WARMER CLEARIFY (MISCELLANEOUS) ×2 IMPLANT
DERMABOND ADVANCED (GAUZE/BANDAGES/DRESSINGS) ×1
DERMABOND ADVANCED .7 DNX12 (GAUZE/BANDAGES/DRESSINGS) ×1 IMPLANT
DRAPE LAPAROTOMY 100X77 ABD (DRAPES) IMPLANT
DRAPE LEGGINS SURG 28X43 STRL (DRAPES) ×2 IMPLANT
DRAPE SHEET LG 3/4 BI-LAMINATE (DRAPES) ×4 IMPLANT
DRAPE UNDER BUTTOCK W/FLU (DRAPES) IMPLANT
DRESSING SURGICEL FIBRLLR 1X2 (HEMOSTASIS) ×1 IMPLANT
DRSG SURGICEL FIBRILLAR 1X2 (HEMOSTASIS) ×2
DRSG TELFA 3X8 NADH (GAUZE/BANDAGES/DRESSINGS) ×2 IMPLANT
ELECT BLADE 6 FLAT ULTRCLN (ELECTRODE) ×2 IMPLANT
ELECT CAUTERY BLADE 6.4 (BLADE) IMPLANT
ELECT REM PT RETURN 9FT ADLT (ELECTROSURGICAL) ×2
ELECTRODE REM PT RTRN 9FT ADLT (ELECTROSURGICAL) ×1 IMPLANT
FILTER LAP SMOKE EVAC STRL (MISCELLANEOUS) ×2 IMPLANT
GAUZE SPONGE 4X4 12PLY STRL (GAUZE/BANDAGES/DRESSINGS) IMPLANT
GLOVE BIO SURGEON STRL SZ 6.5 (GLOVE) ×10 IMPLANT
GLOVE INDICATOR 7.0 STRL GRN (GLOVE) ×10 IMPLANT
GOWN STRL REUS W/ TWL LRG LVL3 (GOWN DISPOSABLE) ×4 IMPLANT
GOWN STRL REUS W/TWL LRG LVL3 (GOWN DISPOSABLE) ×4
GRASPER SUT TROCAR 14GX15 (MISCELLANEOUS) ×2 IMPLANT
IRRIGATION STRYKERFLOW (MISCELLANEOUS) ×1 IMPLANT
IRRIGATOR STRYKERFLOW (MISCELLANEOUS) ×2
KIT ACCESSORY DA VINCI DISP (KITS) ×1
KIT ACCESSORY DVNC DISP (KITS) ×1 IMPLANT
KIT PINK PAD W/HEAD ARE REST (MISCELLANEOUS) ×2
KIT PINK PAD W/HEAD ARM REST (MISCELLANEOUS) ×1 IMPLANT
KIT RM TURNOVER CYSTO AR (KITS) ×2 IMPLANT
LABEL OR SOLS (LABEL) ×2 IMPLANT
LIQUID BAND (GAUZE/BANDAGES/DRESSINGS) ×2 IMPLANT
MANIPULATOR VCARE LG CRV RETR (MISCELLANEOUS) IMPLANT
MANIPULATOR VCARE STD CRV RETR (MISCELLANEOUS) ×2 IMPLANT
NDL INSUFF 14G 150MM VS150000 (NEEDLE) ×2 IMPLANT
NDL INSUFF ACCESS 14 VERSASTEP (NEEDLE) ×2 IMPLANT
NDL SAFETY 22GX1.5 (NEEDLE) ×4 IMPLANT
NS IRRIG 1000ML POUR BTL (IV SOLUTION) ×2 IMPLANT
NS IRRIG 500ML POUR BTL (IV SOLUTION) ×2 IMPLANT
OCCLUDER COLPOPNEUMO (BALLOONS) ×2 IMPLANT
PACK BASIN MAJOR ARMC (MISCELLANEOUS) IMPLANT
PACK GYN LAPAROSCOPIC (MISCELLANEOUS) ×2 IMPLANT
PAD OB MATERNITY 4.3X12.25 (PERSONAL CARE ITEMS) ×2 IMPLANT
PAD PREP 24X41 OB/GYN DISP (PERSONAL CARE ITEMS) ×2 IMPLANT
PENCIL ELECTRO HAND CTR (MISCELLANEOUS) ×2 IMPLANT
RETRACTOR GRASP SM DA VINCI (INSTRUMENTS) ×1
RETRACTOR GRASP SM DVNC (INSTRUMENTS) ×1 IMPLANT
SCISSORS METZENBAUM CVD 33 (INSTRUMENTS) IMPLANT
SLEEVE VERSASTEP EXPAND ONEST (MISCELLANEOUS) ×10 IMPLANT
SOLUTION ELECTROLUBE (MISCELLANEOUS) ×2 IMPLANT
SPONGE LAP 18X18 5 PK (GAUZE/BANDAGES/DRESSINGS) IMPLANT
SPONGE XRAY 4X4 16PLY STRL (MISCELLANEOUS) ×2 IMPLANT
STAPLER SKIN PROX 35W (STAPLE) IMPLANT
SUT DVC VLOC 180 0 12IN GS21 (SUTURE)
SUT MAXON ABS #0 GS21 30IN (SUTURE) IMPLANT
SUT PDS AB 1 TP1 96 (SUTURE) IMPLANT
SUT VIC AB 0 CT1 27 (SUTURE)
SUT VIC AB 0 CT1 27XCR 8 STRN (SUTURE) IMPLANT
SUT VIC AB 0 CT1 36 (SUTURE) IMPLANT
SUT VIC AB 1 CT1 36 (SUTURE) ×2 IMPLANT
SUT VIC AB 2-0 CT1 27 (SUTURE) ×1
SUT VIC AB 2-0 CT1 TAPERPNT 27 (SUTURE) ×1 IMPLANT
SUT VICRYL 0 AB UR-6 (SUTURE) ×2 IMPLANT
SUT VICRYL AB 3-0 FS1 BRD 27IN (SUTURE) IMPLANT
SUTURE DVC VLC 180 0 12IN GS21 (SUTURE) IMPLANT
SYR 3ML LL SCALE MARK (SYRINGE) ×2 IMPLANT
SYR 50ML LL SCALE MARK (SYRINGE) IMPLANT
SYR BULB IRRIG 60ML STRL (SYRINGE) IMPLANT
SYR CONTROL 10ML (SYRINGE) ×2 IMPLANT
SYRINGE 10CC LL (SYRINGE) ×4 IMPLANT
SYRINGE IRR TOOMEY STRL 70CC (SYRINGE) ×2 IMPLANT
TOWEL OR 17X26 4PK STRL BLUE (TOWEL DISPOSABLE) ×2 IMPLANT
TROCAR 130MM GELPORT  DAV (MISCELLANEOUS) ×2 IMPLANT
TROCAR DISP BLADELESS 8 DVNC (TROCAR) ×1 IMPLANT
TROCAR DISP BLADELESS 8MM (TROCAR) ×1
TROCAR VERSASTEP 12M LG PL (TROCAR) ×2 IMPLANT
TROCAR VERSASTEP PLUS 12MM (TROCAR) ×2 IMPLANT
TUBING INSUFFLATOR HEATED (MISCELLANEOUS) ×2 IMPLANT

## 2016-07-29 NOTE — Anesthesia Post-op Follow-up Note (Cosign Needed)
Anesthesia QCDR form completed.        

## 2016-07-29 NOTE — Anesthesia Preprocedure Evaluation (Signed)
Anesthesia Evaluation  Patient identified by MRN, date of birth, ID band Patient awake    Reviewed: Allergy & Precautions, NPO status , Patient's Chart, lab work & pertinent test results, reviewed documented beta blocker date and time   Airway Mallampati: III  TM Distance: <3 FB     Dental no notable dental hx.    Pulmonary COPD, former smoker,     + decreased breath sounds      Cardiovascular hypertension, Pt. on medications and Pt. on home beta blockers + angina with exertion + CAD and + Peripheral Vascular Disease  Normal cardiovascular exam+ Valvular Problems/Murmurs      Neuro/Psych Anxiety  Neuromuscular disease    GI/Hepatic negative GI ROS, Neg liver ROS, GERD  Medicated and Controlled,  Endo/Other  negative endocrine ROS  Renal/GU negative Renal ROS  negative genitourinary   Musculoskeletal  (+) Arthritis , Osteoarthritis,    Abdominal (+) + obese,   Peds negative pediatric ROS (+)  Hematology  (+) anemia ,   Anesthesia Other Findings Overbite Past Medical History: No date: Anxiety No date: Cervicalgia 02/2012: Coronary artery disease     Comment: Abnormal stress test with anterior wall               ischemia. Cardiac catheterization showed a 95%               stenosis in the proximal LAD bifurcating with a              90% stenosis in first diagonal. Ejection               fraction was 45% with anterior wall               hypokinesis. She underwent balloon angioplasty               to ostial first diagonal and drug-eluting stent              placement to proximal LAD with a 3.0 x 15 mm               Xience EX drug-eluting stent No date: Endometrial cancer ([place]) No date: Essential hypertension, benign No date: Fibrocystic breast disease No date: GERD (gastroesophageal reflux disease) No date: Gestational hypertension No date: Heart disease No date: Heart murmur No date: History of anemia No date:  History of blood transfusion 2011: Hodgkin's lymphoma ([place])     Comment: s/p radiation and chemo therapy No date: Osteoarthritis No date: Polycystic ovarian disease  Reproductive/Obstetrics                             Anesthesia Physical  Anesthesia Plan  ASA: III  Anesthesia Plan: General   Post-op Pain Management:    Induction: Intravenous  Airway Management Planned: Oral ETT  Additional Equipment:   Intra-op Plan:   Post-operative Plan: Extubation in OR  Informed Consent: I have reviewed the patients History and Physical, chart, labs and discussed the procedure including the risks, benefits and alternatives for the proposed anesthesia with the patient or authorized representative who has indicated his/her understanding and acceptance.     Plan Discussed with: CRNA and Surgeon  Anesthesia Plan Comments:         Anesthesia Quick Evaluation

## 2016-07-29 NOTE — Anesthesia Procedure Notes (Signed)
Procedure Name: Intubation Date/Time: 07/29/2016 7:59 AM Performed by: Hedda Slade Pre-anesthesia Checklist: Patient identified, Emergency Drugs available, Suction available and Patient being monitored Patient Re-evaluated:Patient Re-evaluated prior to inductionOxygen Delivery Method: Circle system utilized Preoxygenation: Pre-oxygenation with 100% oxygen Intubation Type: IV induction Ventilation: Mask ventilation without difficulty and Oral airway inserted - appropriate to patient size Laryngoscope Size: Mac and 3 Grade View: Grade I Tube type: Oral Tube size: 7.0 mm Number of attempts: 1 Airway Equipment and Method: Stylet Placement Confirmation: ETT inserted through vocal cords under direct vision,  positive ETCO2 and breath sounds checked- equal and bilateral Secured at: 20 cm Tube secured with: Tape Dental Injury: Teeth and Oropharynx as per pre-operative assessment

## 2016-07-29 NOTE — Discharge Instructions (Signed)
Discharge instructions:  Call Dr Guido Sander or Dr Gershon Crane office if you have any of the following: fever >101 F, chills, excessive vaginal bleeding, incision drainage or problems, leg pain or redness, or any other concerns.   Activity: Do not lift > 10 lbs for 8 weeks.  No intercourse or tampons for 8 weeks.  No driving for 1-2 weeks.   You may feel some pain in your upper right abdomen/rib and right shoulder.  This is from the gas in the abdomen for surgery. This will subside over time, please be patient!  Take 600mg  Ibuprofen and 1000mg  Tylenol around the clock, every 6 hours for at least the first 3-5 days.  After this you can take as needed.  This will help decrease inflammation and promote healing.  The narcotics you'll take just as needed, as they just trick your brain into thinking its not in pain.    Please don't limit yourself in terms of routine activity.  You will be able to do most things, although they may take longer to do or be a little painful.  You can do it!  Don't be a hero, but don't be a wimp either!

## 2016-07-29 NOTE — Transfer of Care (Signed)
Immediate Anesthesia Transfer of Care Note  Patient: Jaime Hall  Procedure(s) Performed: Procedure(s): ROBOTIC ASSISTED TOTAL HYSTERECTOMY WITH BILATERAL SALPINGO OOPHORECTOMY (N/A) SENTINEL NODE BIOPSY (N/A) PELVIC/AORTIC LYMPH NODE SAMPLING (N/A)  Patient Location: PACU  Anesthesia Type:General  Level of Consciousness: awake, alert  and oriented  Airway & Oxygen Therapy: Patient Spontanous Breathing and Patient connected to face mask oxygen  Post-op Assessment: Report given to RN and Post -op Vital signs reviewed and stable  Post vital signs: Reviewed and stable  Last Vitals:  Vitals:   07/29/16 0558 07/29/16 1120  BP: (!) 141/70   Pulse: 70 80  Resp: 12 15  Temp: 36.2 C (!) 36.1 C    Last Pain:  Vitals:   07/29/16 0558  TempSrc: Tympanic         Complications: No apparent anesthesia complications

## 2016-07-29 NOTE — OR Nursing (Signed)
Patient states that she had a lymph node biopsy from throat. Denies any node work on upper extremities and has had IV's and blood pressures in both arms previously without any limitations.

## 2016-07-29 NOTE — Interval H&P Note (Signed)
History and Physical Interval Note:  07/29/2016 7:31 AM  Jaime Hall  has presented today for surgery, with the diagnosis of uterine cancer  The various methods of treatment have been discussed with the patient and family. After consideration of risks, benefits and other options for treatment, the patient has consented to  Procedure(s): ROBOTIC ASSISTED TOTAL HYSTERECTOMY WITH BILATERAL SALPINGO OOPHORECTOMY (N/A) SENTINEL NODE BIOPSY (N/A) PELVIC/AORTIC LYMPH NODE SAMPLING (N/A) as a surgical intervention .  The patient's history has been reviewed, patient examined, no change in status, stable for surgery.  I have reviewed the patient's chart and labs.  Questions were answered to the patient's satisfaction.    Follow up appt scheduled 09/02/2016. Return to work is an issue we will continue to discuss as she is required to lift up to 50 pounds. She may need 6 weeks off from work.   Tramond Slinker ALVAREZ

## 2016-07-29 NOTE — Op Note (Signed)
Operative Note   Date 07/29/2016 Time 11:09 AM  PRE-OP DIAGNOSIS: ENDOMETRIAL CANCER, GRADE 1    POST-OP DIAGNOSIS: STAGE 1 ENDOMETRIAL CANCER, GRADE 1, pending final pathology. Severe pelvic abdominal adhesive diseae.  SURGEON: Surgeon(s) and Role: Panel 1: Chelsea Ward, MD; robotic hysterectomy, and bilateral salpingo-oophorectomy with washings.  Panel 2:    * Angeles Gaetana Michaelis, MD - Secondary for Sentinel node injection, mapping, and biopsy; pelvic adhesiolysis including enterolysis and ovariolysis > 45 minutes.    ANESTHESIA: Choice   PROCEDURE: Procedure(s): Sentinel node injection, mapping, and bilateral pelvic sentinel lymph node biopsy  ESTIMATED BLOOD LOSS:  75 cc  DRAINS: Foley 800 cc  TOTAL IV FLUIDS: 700 cc  SPECIMENS:  Right primary sentinel mid obturator primary node; Right primary sentinel external iliac artery/vein primary node; left primary sentinel external iliac vein node   COMPLICATIONS: None  DISPOSITION: stable  CONDITION: stable  INDICATIONS: Endometrial cancer  FINDINGS: Exam under anesthesia revealed a small 6 cm mobile anteverted uterus. There were no adnexal masses or nodularity. The parametria was smooth. The cervix was negative for gross lesions. Intraoperative findings included small 6 cm uterus. The adnexa were normal bilaterally except for several adhesive disease. The terminal ilium was adherent to the right IP as well as right adnexa and round ligament. The cul de sac was obliterated and adherent to the posterior cervix. The rectosigmoid peritoneum was adherent to the left adnexa. The left ovary was a streak ovary and adherent to the rectosigmoid and posterior uterus and cervix. The upper abdomen was normal including omentum except for adhesions to the anterior abdominal wall, bowel, liver, stomach, and diaphragmatic surfaces. There was no evidence of grossly malignant pelvic or right para-aortic lymph nodes. The sentinel node injection  was successful with evidence of right primary sentinel nodes noted above (two primary channels); and left primary sentinel external iliac vein node.    PROCEDURE IN DETAIL: After informed consent was obtained, the patient was taken to the operating room where anesthesia was obtained without difficulty. The patient was positioned in the dorsal lithotomy position in [place] and her arms were carefully tucked at her sides and the usual precautions were taken.  She was prepped and draped in normal sterile fashion.  Time-out was performed and a Foley catheter was placed into the bladder and the cervix was infiltrated with 4 ml of ICG at 3 an 9 o'clock both superficial and deep injections. A standard VCare uterine manipulator was then placed in the uterus without incident.    Operative entry approximately 2 cm superior to umbilicus using direct visualization. The fascia was tagged and Hasson port placed. Pneumoperitoneum obtained. Additional 3 ports placed under direct visualization including 5/12 port in LUQ and bilateral 8 mm robotic ports. The patient was placed in Trendelenburg and the bowel was displaced up into the upper abdomen.  Findings noted as above.Cytologic washings were obtained.  Patient placed in trendelenburg. Round ligaments were divided on each side with the EndoShears and the retroperitoneal space was opened bilaterally.  The adhesions involving the small bowel and rectosigmoid were lysed with care. In total > 45 minutes of adhesiolysis was performed including enterolysis and ovariolysis. The ureters were identified and preserved.  At this point the retroperitoneal spaces were developed and the lymphatic channels were mapped to each side.  The primary sentinel nodes on the right side was then identified, skeletonized and removed taking care not to injure the  obturator nerve, the ureter or the pelvic vasculature. On the  left adhesions involving the peritoneum to the uterus were lysed.  Similarly on the left side, the retroperitoneal spaces were developed, the lymphatic channels mapped to identify the sentinel nodeand they were similarly removed with care to preserve the obturator nerve, the ureter and the pelvic vasculature. With hemostasis secured, the infundibulopelvic ligaments were skeletonized, sealed and divided with the LigaSure device.  A bladder flap was created and the bladder was dissected down off the lower uterine segment and cervix using endoshears and electrocautery. Additional adhesiolysis was performed to dissect the rectosigmoid off the posterior uterus and cervix. An EEA device was placed to confirm rectum location which was noted below the level of the cup. The uterine arteries were skeletonized bilaterally, sealed and divided with the LigaSure device.  A colpotomy was performed circumferentially along the V-Care ring with electrocautery and the cervix was incised from the vagina and the specimen was removed through the vagina.  A pneumo balloon was placed in the vagina and the vaginal cuff was then closed in a running continuous fashion using the EndoStitch technique with 0 V-Lock suture with careful attention to include the vaginal cuff angles and the vaginal mucosa within the closure.  Intraoperative visual evaluation revealed a small lesion approximately 2 cm and therefore further dissection was not performed. Fibrillar was placed on the vaginal cuff, cul de sac, and nodal basins. A bubble test was performed and was negative.  Hemostasis was observed. The intraperitoneal pressure was dropped, and all planes of dissection, vascular pedicles and the vaginal cuff were found to be hemostatic.  The LUQ trocar was removed and the fascia was closed with 0 Vicryl suture using the Endoclose technique. The lateral trocars were removed under visualization.   Before the umbilical trocar was removed the CO2 gas was released.  The fascia there was closed with 0 Vicryl suture in  interrupted technique.   The skin incision at the umbilicus was closed with subcuticular stitch.  The remaining skin incisions were closed with Indermil glue.  The patient tolerated the procedure well.  Sponge, lap and needle counts were correct x2.  The patient was taken to recovery room in excellent condition.  Antibiotics: Given 1st or 2nd generation cephalosporin, Antibiotics given within 1 hour of the start of the procedure, Antibiotics ordered to be discontinued within 24 hours post procedure   VTE prophylaxis: was ordered perioperatively.  I assisted Dr. Leonides Schanz with her portion of the procedure. I performed the SLN injection, mapping, and biopsies as well as extensive adhesiolysis > 45 minutes.  [name], MD

## 2016-07-29 NOTE — Progress Notes (Signed)
States pain is better and wants to go home   Minimal drainage on peri pad

## 2016-07-29 NOTE — H&P (View-Only) (Signed)
Gynecologic Oncology Consult Visit   Referring Provider: Dr. Servando Salina  Chief Concern: Grade 1 endometrial cancer  Subjective:  Jaime Hall is a 66 y.o. G2P2 female who is seen in consultation from [name] and Garwin Brothers for grade 1 endometrial cancer.  History of Hodgkin's Lymphoma 2011 treated successfully at [facility] with chemo and RT.  Menopause age 40 and no hormone use.  Bladder sling for SUI 2009.  Benign cervical polyp removed 5/16.  Developed large amount of postmenopausal bleeding for two weeks in January, and sonohysterogram showed 2.5 cm fundal lesion.  Hct 41%.  PAP/HPV normal.  Endometrial biopsy done 07/15/16 showed grade 1 endometrial cancer. Now only light bleeding.  Has some leg edema due to chronic venous insufficiency.  Had angioplasty and cardiac stents in 2013 and was on Plavix for three years, now just ASA.  No chest pain and lives alone and works and is highly functional.  Took Tamiflu last week of January after being diagnosed with flu at urgent care center, but this has resolved.   Works as Dealer.    Has some ringing in ears and left inner ear dizziness and night sweats.  Problem List: Patient Active Problem List   Diagnosis Date Noted  . Lymphedema 07/20/2016  . Chronic venous insufficiency 07/20/2016  . PAD (peripheral artery disease) ([place]) 07/20/2016  . Postmenopausal bleeding 07/14/2016  . Influenza 07/14/2016  . Class 1 obesity due to excess calories without serious comorbidity with body mass index (BMI) of 34.0 to 34.9 in adult 05/27/2016  . Chronic cough 12/19/2015  . Pre-syncope 12/19/2015  . Hx of colonic polyps   . Benign neoplasm of sigmoid colon   . Dysphagia, pharyngoesophageal phase 10/01/2014  . Prolapsed, uterovaginal, incomplete 06/24/2014  . Routine general medical examination at a health care facility 12/30/2012  . Obesity (BMI 30-39.9) 12/30/2012  . Hx of multiple pulmonary nodules 12/28/2012  . Plantar  fasciitis of right foot 12/28/2012  . 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 Edema of both legs 09/12/2012  . Hip pain, bilateral 09/12/2012  . History of Hodgkin's lymphoma 09/12/2012  . Coronary artery disease   . Hyperlipidemia   . Chest pain on exertion 02/29/2012    Past Medical History: Past Medical History:  Diagnosis Date  . Cervicalgia   . Coronary artery disease 02/2012   Abnormal stress test with anterior wall ischemia. Cardiac catheterization showed a 95% stenosis in the proximal LAD bifurcating with a 90% stenosis in first diagonal. Ejection fraction was 45% with anterior wall hypokinesis. She underwent balloon angioplasty to ostial first diagonal and drug-eluting stent placement to proximal LAD with a 3.0 x 15 mm Xience EX drug-eluting stent  . Essential hypertension, benign   . Fibrocystic breast disease   . Gestational hypertension   . Heart disease   . Heart murmur   . History of anemia   . History of blood transfusion   . Hodgkin's lymphoma ([place]) 2011   s/p radiation and chemo therapy  . Osteoarthritis   . Polycystic ovarian disease     Past Surgical History: Past Surgical History:  Procedure Laterality Date  . bladder sling    . CARDIAC CATHETERIZATION  02/2012   [facility]  . CERVICAL POLYPECTOMY    . CHOLECYSTECTOMY  1982  . COLONOSCOPY WITH PROPOFOL N/A 02/05/2015   Procedure: COLONOSCOPY WITH PROPOFOL;  Surgeon: Lucilla Lame, MD;  Location: [facility] ENDOSCOPY;  Service: Endoscopy;  Laterality: N/A;  . CORONARY ANGIOPLASTY  02/2012  left/right s/p balloon  . heart stent'  2013  . transobturator sling N/A 2009   [facility]    Family History: Family History  Problem Relation Age of Onset  . ALS Father   . Polymyositis Father   . Diabetes Brother   . Cancer Maternal Aunt     breast  . Breast cancer Maternal Aunt     30's  . Stroke Maternal Grandmother   . Cancer Maternal Grandfather     prostate  . Stroke Maternal Grandfather      Social History: Social History   Social History  . Marital status: Married    Spouse name: N/A  . Number of children: N/A  . Years of education: N/A   Occupational History  . Not on file.   Social History Main Topics  . Smoking status: Former Smoker    Packs/day: 1.00    Years: 30.00    Types: Cigarettes  . Smokeless tobacco: Never Used     Comment: quit smoking in 2000  . Alcohol use Yes     Comment: occasional  . Drug use: No  . Sexual activity: No   Other Topics Concern  . Not on file   Social History Narrative  . No narrative on file    Allergies: Allergies  Allergen Reactions  . Z-Pak [Azithromycin] Itching  . Antifungal [Miconazole Nitrate] Rash  . Sulfa Antibiotics Nausea And Vomiting and Rash    N/T    Current Medications: Current Outpatient Prescriptions  Medication Sig Dispense Refill  . aspirin 81 MG tablet Take 81 mg by mouth daily.    Marland Kitchen atorvastatin (LIPITOR) 10 MG tablet TAKE ONE TABLET BY MOUTH ONCE DAILY 90 tablet 3  . B Complex Vitamins (B COMPLEX 1 PO) Take 1 tablet by mouth daily.    . benzonatate (TESSALON) 100 MG capsule Take 100 mg by mouth 3 (three) times daily as needed for cough.    Marland Kitchen BIOTIN PO Take by mouth daily. Reported on 11/22/2015    . Cholecalciferol (VITAMIN D) 2000 UNITS tablet Take 2,000 Units by mouth daily. Reported on 12/18/2015    . Cyanocobalamin (VITAMIN B-12) 1000 MCG SUBL Place 1 tablet under the tongue daily.    . diazepam (VALIUM) 5 MG tablet Take 1 tablet (5 mg total) by mouth every 12 (twelve) hours as needed for anxiety. 30 tablet 1  . docusate (COLACE) 50 MG/5ML liquid 2-3 drops in each ear nightly 100 mL 0  . esomeprazole (NEXIUM) 20 MG capsule Take 20 mg by mouth daily at 12 noon.    . lansoprazole (PREVACID) 30 MG capsule Take 1 capsule (30 mg total) by mouth daily at 12 noon. 30 capsule 10  . metoprolol tartrate (LOPRESSOR) 25 MG tablet TAKE ONE TABLET BY MOUTH TWICE DAILY 180 tablet 2  . nitroGLYCERIN  (NITROSTAT) 0.4 MG SL tablet Place 1 tablet (0.4 mg total) under the tongue every 5 (five) minutes as needed for chest pain. 25 tablet 3  . TURMERIC PO Take by mouth.     No current facility-administered medications for this visit.     Review of Systems General: negative for, fevers, chills, fatigue, changes in sleep, changes in weight or appetite Skin: negative for changes in color, texture, moles or lesions Eyes: negative for, changes in vision, pain, diplopia HEENT: negative for, change in hearing, pain, discharge, tinnitus, vertigo, voice changes, sore throat, neck masses Breasts: negative for breast lumps Pulmonary: negative for, dyspnea, orthopnea, productive cough Cardiac: negative for, palpitations,  syncope, pain, discomfort, pressure Gastrointestinal: negative for, dysphagia, nausea, vomiting, jaundice, pain, constipation, diarrhea, hematemesis, hematochezia Genitourinary/Sexual: negative for, dysuria, discharge, hesitancy, nocturia, retention, stones, infections, STD's, incontinence Ob/Gyn: negative for, pain Musculoskeletal: negative for, pain, stiffness, swelling, range of motion limitation Hematology: negative for, easy bruising Neurologic/Psych: negative for, headaches, seizures, paralysis, weakness, tremor, change in gait, change in sensation, mood swings, depression, anxiety, change in memory  Objective:  Physical Examination:  BP 108/73   Pulse 67   Temp 97 F (36.1 C) (Tympanic)   Resp 18   Ht 5' 1.75" (1.568 m)   Wt 173 lb 11.6 oz (78.8 kg)   BMI 32.03 kg/m    ECOG Performance Status: 1 - Symptomatic but completely ambulatory  General appearance: alert, cooperative and appears stated age HEENT:PERRLA, neck supple with midline trachea and thyroid without masses Lymph node survey: non-palpable, axillary, inguinal, supraclavicular Cardiovascular: regular rate and rhythm, no murmurs or gallops Respiratory: normal air entry, lungs clear to auscultation and no  rales, rhonchi or wheezing Breast exam: not examined. Abdomen: soft, non-tender, without masses or organomegaly, no hernias and well healed incision Back: inspection of back is normal Extremities: mild venous insufficiency changes with superficial varices. Skin exam - normal coloration and turgor, no rashes, no suspicious skin lesions noted. Neurological exam reveals alert, oriented, normal speech, no focal findings or movement disorder noted.  Pelvic: exam chaperoned by nurse;  Vulva: normal appearing vulva with no masses, tenderness or lesions; Vagina: normal vagina; Adnexa: normal adnexa in size, nontender and no masses; Uterus: uterus is normal size, shape, consistency and nontender; Cervix: anteverted; Rectal: normal rectal, no masses    Assessment:  JAELLA STAMER is a 66 y.o. female diagnosed with grade 1 endometrial cancer.   Medical co-morbidities complicating care: 30 pack year former smoker s/p coronary angioplasty and stents 2013 and takes baby ASA.  Plan:   Problem List Items Addressed This Visit    None    Visit Diagnoses    Endometrial cancer ([place])    -  Primary     We discussed options for management for grade 1 endometrial cancer including robotic TLH, BSO with sentinel lymph node mapping and biopsies and possible pelvic lymphadenectomy if nodes do not map with Dr [name] 07/29/16. Will ask Dr Leonides Schanz to assist.  The risks of surgery were discussed in detail and she understands these to include infection; cervical incompetence; failure to remove the entire affected area; recurrence of dysplasia; injury to adjacent organs such as bowel, bladder, blood vessels, ureters, vagina, and nerves; bleeding which may require blood transfusion; anesthesia risk; thromboembolic events; possible death; unforeseen complications; possible need for further procedures; medical complications such as heart attack, stroke, pleural effusion and pneumonia.  The patient will receive DVT and antibiotic  prophylaxis as indicated.  She voiced a clear understanding.  She had the opportunity to ask questions and written informed consent was obtained today.  The risks of surgery were discussed in detail and she understands these to include infection; wound separation; hernia; vaginal cuff separation, injury to adjacent organs such as bowel, bladder, blood vessels, ureters and nerves; bleeding which may require blood transfusion; anesthesia risk; thromboembolic events; possible death; unforeseen complications; possible need for re-exploration; medical complications such as heart attack, stroke, pleural effusion and pneumonia; and, if staging performed the risk of lymphedema and lymphocyst.  The patient will receive DVT and antibiotic prophylaxis as indicated.  She voiced a clear understanding.  She had the opportunity to ask questions and written  informed consent was obtained today.  The patient's diagnosis, an outline of the further diagnostic and laboratory studies which will be required, the recommendation, and alternatives were discussed.  All questions were answered to the patient's satisfaction.  A total of 60 minutes were spent with the patient/family today; 30% was spent in education, counseling and coordination of care for endometrial cancer.    Mellody Drown, MD  CC:  [name], MD [place], [place] [address] [phone]

## 2016-07-30 ENCOUNTER — Encounter: Payer: Self-pay | Admitting: Obstetrics and Gynecology

## 2016-07-30 NOTE — Anesthesia Postprocedure Evaluation (Signed)
Anesthesia Post Note  Patient: Jaime Hall  Procedure(s) Performed: Procedure(s) (LRB): ROBOTIC ASSISTED TOTAL HYSTERECTOMY WITH BILATERAL SALPINGO OOPHORECTOMY (N/A) SENTINEL NODE BIOPSY (N/A) PELVIC/AORTIC LYMPH NODE SAMPLING (N/A)  Patient location during evaluation: PACU Anesthesia Type: General Level of consciousness: awake and alert and oriented Pain management: pain level controlled Vital Signs Assessment: post-procedure vital signs reviewed and stable Respiratory status: spontaneous breathing Cardiovascular status: blood pressure returned to baseline Anesthetic complications: no     Last Vitals:  Vitals:   07/29/16 1220 07/29/16 1236  BP: (!) 126/50 (!) 145/49  Pulse: 65 64  Resp: 14 16  Temp:  (!) 35.9 C    Last Pain:  Vitals:   07/29/16 1300  TempSrc:   PainSc: 1                  Meital Riehl

## 2016-08-03 LAB — CYTOLOGY - NON PAP

## 2016-08-05 ENCOUNTER — Emergency Department: Payer: PPO

## 2016-08-05 ENCOUNTER — Other Ambulatory Visit: Payer: Self-pay

## 2016-08-05 ENCOUNTER — Inpatient Hospital Stay
Admission: EM | Admit: 2016-08-05 | Discharge: 2016-08-07 | DRG: 759 | Disposition: A | Discharge status: Home / Self Care | Payer: PPO | Attending: Obstetrics & Gynecology | Admitting: Obstetrics & Gynecology

## 2016-08-05 DIAGNOSIS — Z955 Presence of coronary angioplasty implant and graft: Secondary | ICD-10-CM

## 2016-08-05 DIAGNOSIS — I890 Lymphedema, not elsewhere classified: Secondary | ICD-10-CM | POA: Diagnosis not present

## 2016-08-05 DIAGNOSIS — Z9221 Personal history of antineoplastic chemotherapy: Secondary | ICD-10-CM | POA: Diagnosis not present

## 2016-08-05 DIAGNOSIS — I214 Non-ST elevation (NSTEMI) myocardial infarction: Secondary | ICD-10-CM | POA: Diagnosis not present

## 2016-08-05 DIAGNOSIS — Z87891 Personal history of nicotine dependence: Secondary | ICD-10-CM

## 2016-08-05 DIAGNOSIS — R1310 Dysphagia, unspecified: Secondary | ICD-10-CM | POA: Diagnosis not present

## 2016-08-05 DIAGNOSIS — Z923 Personal history of irradiation: Secondary | ICD-10-CM | POA: Diagnosis not present

## 2016-08-05 DIAGNOSIS — Z79899 Other long term (current) drug therapy: Secondary | ICD-10-CM

## 2016-08-05 DIAGNOSIS — H9312 Tinnitus, left ear: Secondary | ICD-10-CM | POA: Diagnosis not present

## 2016-08-05 DIAGNOSIS — K219 Gastro-esophageal reflux disease without esophagitis: Secondary | ICD-10-CM | POA: Diagnosis not present

## 2016-08-05 DIAGNOSIS — A419 Sepsis, unspecified organism: Secondary | ICD-10-CM

## 2016-08-05 DIAGNOSIS — R600 Localized edema: Secondary | ICD-10-CM | POA: Diagnosis not present

## 2016-08-05 DIAGNOSIS — I872 Venous insufficiency (chronic) (peripheral): Secondary | ICD-10-CM | POA: Diagnosis not present

## 2016-08-05 DIAGNOSIS — Z9109 Other allergy status, other than to drugs and biological substances: Secondary | ICD-10-CM | POA: Diagnosis not present

## 2016-08-05 DIAGNOSIS — Z881 Allergy status to other antibiotic agents status: Secondary | ICD-10-CM | POA: Diagnosis not present

## 2016-08-05 DIAGNOSIS — R6510 Systemic inflammatory response syndrome (SIRS) of non-infectious origin without acute organ dysfunction: Secondary | ICD-10-CM | POA: Diagnosis present

## 2016-08-05 DIAGNOSIS — N951 Menopausal and female climacteric states: Secondary | ICD-10-CM | POA: Diagnosis not present

## 2016-08-05 DIAGNOSIS — Z833 Family history of diabetes mellitus: Secondary | ICD-10-CM

## 2016-08-05 DIAGNOSIS — Z8571 Personal history of Hodgkin lymphoma: Secondary | ICD-10-CM | POA: Diagnosis not present

## 2016-08-05 DIAGNOSIS — Z7982 Long term (current) use of aspirin: Secondary | ICD-10-CM

## 2016-08-05 DIAGNOSIS — Z803 Family history of malignant neoplasm of breast: Secondary | ICD-10-CM | POA: Diagnosis not present

## 2016-08-05 DIAGNOSIS — E282 Polycystic ovarian syndrome: Secondary | ICD-10-CM | POA: Diagnosis present

## 2016-08-05 DIAGNOSIS — Z90710 Acquired absence of both cervix and uterus: Secondary | ICD-10-CM | POA: Diagnosis not present

## 2016-08-05 DIAGNOSIS — Z8542 Personal history of malignant neoplasm of other parts of uterus: Secondary | ICD-10-CM

## 2016-08-05 DIAGNOSIS — R109 Unspecified abdominal pain: Secondary | ICD-10-CM | POA: Diagnosis not present

## 2016-08-05 DIAGNOSIS — Z6834 Body mass index (BMI) 34.0-34.9, adult: Secondary | ICD-10-CM | POA: Diagnosis not present

## 2016-08-05 DIAGNOSIS — E669 Obesity, unspecified: Secondary | ICD-10-CM | POA: Diagnosis not present

## 2016-08-05 DIAGNOSIS — I2510 Atherosclerotic heart disease of native coronary artery without angina pectoris: Secondary | ICD-10-CM | POA: Diagnosis not present

## 2016-08-05 DIAGNOSIS — Z9049 Acquired absence of other specified parts of digestive tract: Secondary | ICD-10-CM

## 2016-08-05 DIAGNOSIS — M1990 Unspecified osteoarthritis, unspecified site: Secondary | ICD-10-CM | POA: Diagnosis not present

## 2016-08-05 DIAGNOSIS — R509 Fever, unspecified: Secondary | ICD-10-CM | POA: Diagnosis not present

## 2016-08-05 DIAGNOSIS — Z888 Allergy status to other drugs, medicaments and biological substances status: Secondary | ICD-10-CM | POA: Diagnosis not present

## 2016-08-05 DIAGNOSIS — Z882 Allergy status to sulfonamides status: Secondary | ICD-10-CM

## 2016-08-05 DIAGNOSIS — C541 Malignant neoplasm of endometrium: Secondary | ICD-10-CM | POA: Diagnosis not present

## 2016-08-05 DIAGNOSIS — E785 Hyperlipidemia, unspecified: Secondary | ICD-10-CM | POA: Diagnosis not present

## 2016-08-05 DIAGNOSIS — N6019 Diffuse cystic mastopathy of unspecified breast: Secondary | ICD-10-CM | POA: Diagnosis not present

## 2016-08-05 DIAGNOSIS — N738 Other specified female pelvic inflammatory diseases: Principal | ICD-10-CM | POA: Diagnosis present

## 2016-08-05 DIAGNOSIS — Z823 Family history of stroke: Secondary | ICD-10-CM | POA: Diagnosis not present

## 2016-08-05 DIAGNOSIS — R42 Dizziness and giddiness: Secondary | ICD-10-CM | POA: Diagnosis not present

## 2016-08-05 DIAGNOSIS — I10 Essential (primary) hypertension: Secondary | ICD-10-CM | POA: Diagnosis present

## 2016-08-05 DIAGNOSIS — G629 Polyneuropathy, unspecified: Secondary | ICD-10-CM | POA: Diagnosis not present

## 2016-08-05 DIAGNOSIS — Z86010 Personal history of colonic polyps: Secondary | ICD-10-CM | POA: Diagnosis not present

## 2016-08-05 LAB — CBC WITH DIFFERENTIAL/PLATELET
Basophils Absolute: 0.1 10*3/uL (ref 0–0.1)
Basophils Relative: 0 %
Eosinophils Absolute: 0 10*3/uL (ref 0–0.7)
Eosinophils Relative: 0 %
HCT: 36.8 % (ref 35.0–47.0)
Hemoglobin: 12.6 g/dL (ref 12.0–16.0)
Lymphocytes Relative: 5 %
Lymphs Abs: 1.3 10*3/uL (ref 1.0–3.6)
MCH: 30.2 pg (ref 26.0–34.0)
MCHC: 34.2 g/dL (ref 32.0–36.0)
MCV: 88.2 fL (ref 80.0–100.0)
Monocytes Absolute: 1.5 10*3/uL — ABNORMAL HIGH (ref 0.2–0.9)
Monocytes Relative: 6 %
Neutro Abs: 21.2 10*3/uL — ABNORMAL HIGH (ref 1.4–6.5)
Neutrophils Relative %: 89 %
Platelets: 178 10*3/uL (ref 150–440)
RBC: 4.17 MIL/uL (ref 3.80–5.20)
RDW: 14.2 % (ref 11.5–14.5)
WBC: 24.1 10*3/uL — ABNORMAL HIGH (ref 3.6–11.0)

## 2016-08-05 LAB — URINALYSIS, COMPLETE (UACMP) WITH MICROSCOPIC
Bacteria, UA: NONE SEEN
Bilirubin Urine: NEGATIVE
Glucose, UA: NEGATIVE mg/dL
Ketones, ur: 5 mg/dL — AB
Nitrite: NEGATIVE
Protein, ur: 100 mg/dL — AB
Specific Gravity, Urine: 1.018 (ref 1.005–1.030)
pH: 6 (ref 5.0–8.0)

## 2016-08-05 LAB — COMPREHENSIVE METABOLIC PANEL
ALT: 29 U/L (ref 14–54)
AST: 24 U/L (ref 15–41)
Albumin: 3.5 g/dL (ref 3.5–5.0)
Alkaline Phosphatase: 96 U/L (ref 38–126)
Anion gap: 8 (ref 5–15)
BUN: 12 mg/dL (ref 6–20)
CO2: 27 mmol/L (ref 22–32)
Calcium: 8.8 mg/dL — ABNORMAL LOW (ref 8.9–10.3)
Chloride: 102 mmol/L (ref 101–111)
Creatinine, Ser: 1.05 mg/dL — ABNORMAL HIGH (ref 0.44–1.00)
GFR calc Af Amer: >60 mL/min (ref >60)
GFR calc non Af Amer: 54 mL/min — ABNORMAL LOW (ref >60)
Glucose, Bld: 103 mg/dL — ABNORMAL HIGH (ref 65–99)
Potassium: 3.6 mmol/L (ref 3.5–5.1)
Sodium: 137 mmol/L (ref 135–145)
Total Bilirubin: 1.8 mg/dL — ABNORMAL HIGH (ref 0.3–1.2)
Total Protein: 7 g/dL (ref 6.5–8.1)

## 2016-08-05 LAB — LACTIC ACID, PLASMA: Lactic Acid, Venous: 1.4 mmol/L (ref 0.5–1.9)

## 2016-08-05 LAB — INFLUENZA PANEL BY PCR (TYPE A & B)
Influenza A By PCR: NEGATIVE
Influenza B By PCR: NEGATIVE

## 2016-08-05 LAB — TROPONIN I: Troponin I: 0.06 ng/mL (ref <0.03)

## 2016-08-05 LAB — PROTIME-INR
INR: 1.02
Prothrombin Time: 13.4 seconds (ref 11.4–15.2)

## 2016-08-05 LAB — PROCALCITONIN: Procalcitonin: 0.69 ng/mL

## 2016-08-05 LAB — BRAIN NATRIURETIC PEPTIDE: B Natriuretic Peptide: 667 pg/mL — ABNORMAL HIGH (ref 0.0–100.0)

## 2016-08-05 LAB — LIPASE, BLOOD: Lipase: 26 U/L (ref 11–51)

## 2016-08-05 MED ORDER — ENOXAPARIN SODIUM 40 MG/0.4ML ~~LOC~~ SOLN
40.0000 mg | SUBCUTANEOUS | Status: DC
  Administered 2016-08-05 – 2016-08-06 (×2): 40 mg via SUBCUTANEOUS
  Filled 2016-08-05 (×2): qty 0.4

## 2016-08-05 MED ORDER — LACTATED RINGERS IV SOLN
INTRAVENOUS | Status: DC
  Administered 2016-08-05: 125 mL/h via INTRAVENOUS

## 2016-08-05 MED ORDER — SODIUM CHLORIDE 0.9 % IV BOLUS (SEPSIS)
1000.0000 mL | Freq: Once | INTRAVENOUS | Status: AC
  Administered 2016-08-05: 1000 mL via INTRAVENOUS

## 2016-08-05 MED ORDER — PIPERACILLIN-TAZOBACTAM 3.375 G IVPB 30 MIN: 3.3750 g | Freq: Once | INTRAVENOUS | Status: DC

## 2016-08-05 MED ORDER — IOPAMIDOL (ISOVUE-300) INJECTION 61%
30.0000 mL | Freq: Once | INTRAVENOUS | Status: AC
  Administered 2016-08-05: 30 mL via ORAL

## 2016-08-05 MED ORDER — IOPAMIDOL (ISOVUE-300) INJECTION 61%
100.0000 mL | Freq: Once | INTRAVENOUS | Status: AC | PRN
  Administered 2016-08-05: 100 mL via INTRAVENOUS

## 2016-08-05 MED ORDER — PIPERACILLIN-TAZOBACTAM 3.375 G IVPB
3.3750 g | Freq: Three times a day (TID) | INTRAVENOUS | Status: DC
  Administered 2016-08-05 – 2016-08-07 (×6): 3.375 g via INTRAVENOUS
  Filled 2016-08-05 (×6): qty 50

## 2016-08-05 MED ORDER — SIMETHICONE 80 MG PO CHEW
80.0000 mg | CHEWABLE_TABLET | Freq: Four times a day (QID) | ORAL | Status: DC | PRN
  Filled 2016-08-05: qty 1

## 2016-08-05 MED ORDER — ACETAMINOPHEN 325 MG PO TABS
650.0000 mg | ORAL_TABLET | ORAL | Status: DC | PRN
  Administered 2016-08-05 – 2016-08-06 (×2): 650 mg via ORAL
  Filled 2016-08-05 (×2): qty 2

## 2016-08-05 MED ORDER — SENNA 8.6 MG PO TABS
1.0000 | ORAL_TABLET | Freq: Two times a day (BID) | ORAL | Status: DC
  Administered 2016-08-06 – 2016-08-07 (×2): 8.6 mg via ORAL
  Filled 2016-08-05 (×2): qty 1

## 2016-08-05 MED ORDER — METOCLOPRAMIDE HCL 5 MG/ML IJ SOLN: 10.0000 mg | Freq: Four times a day (QID) | INTRAMUSCULAR | Status: DC | PRN

## 2016-08-05 NOTE — ED Triage Notes (Addendum)
Pt reports that she had fever today of 102F today. Pt had hysterectomy last week 2/14. No other symptoms associated. Pt alert and oriented X4, active, cooperative, pt in NAD. RR even and unlabored, color WNL.   Pt took tylenol at [employer]

## 2016-08-05 NOTE — ED Notes (Signed)
Pt states total hysterectomy on 2/14. Pt states fever began last night and then today. Tmax 102 at home, took tylenol PTA. Pt has 4 laparoscopic incisions that are healing well, glued together, edges look good. No signs of infection from them. Pt also states had scar tissue build up from scraping her colon with hysterectomy. Pt is ambulatory, alert and oriented. States L leg hasn't been working right either but pt able to move it by self. Pt saw Dr. Leonides Schanz for surgery and follow-up, Dr. Leonides Schanz sent pt here because of fever.

## 2016-08-05 NOTE — ED Provider Notes (Addendum)
[facility] Emergency Department Provider Note  ____________________________________________   First MD Initiated Contact with Patient 08/05/16 1740     (approximate)  I have reviewed the triage vital signs and the nursing notes.   HISTORY  Chief Complaint Fever and Post-op Problem    HPI Jaime Hall is a 66 y.o. female who self presents to the emergency department with 2 days of fever. She had a fever to 101 yesterday and 102 today. 7 days ago she had a laparoscopic total hysterectomy for stage I cancer performed at [facility]. Yesterday and today she called her OB gynecologist or today advised that she come to the emergency department for intravenous antibiotics. She does report mild abdominal discomfort is did not get a flu shot this year but she did have. He has no cough or shortness of breath. No rhinorrhea. No dysuria frequency or hesitancy.   Past Medical History:  Diagnosis Date  . Anxiety   . Cervicalgia   . Coronary artery disease 02/2012   Abnormal stress test with anterior wall ischemia. Cardiac catheterization showed a 95% stenosis in the proximal LAD bifurcating with a 90% stenosis in first diagonal. Ejection fraction was 45% with anterior wall hypokinesis. She underwent balloon angioplasty to ostial first diagonal and drug-eluting stent placement to proximal LAD with a 3.0 x 15 mm Xience EX drug-eluting stent  . Endometrial cancer ([place])   . Essential hypertension, benign   . Fibrocystic breast disease   . GERD (gastroesophageal reflux disease)   . Gestational hypertension   . Heart disease   . Heart murmur   . History of anemia   . History of blood transfusion   . Hodgkin's lymphoma ([place]) 2011   s/p radiation and chemo therapy  . Osteoarthritis   . Polycystic ovarian disease     Patient Active Problem List   Diagnosis Date Noted  . SIRS (systemic inflammatory response syndrome) ([place]) 08/05/2016  . Endometrial cancer ([place])   .  Pelvic adhesive disease   . Lymphedema 07/20/2016  . Chronic venous insufficiency 07/20/2016  . PAD (peripheral artery disease) ([place]) 07/20/2016  . Postmenopausal bleeding 07/14/2016  . Influenza 07/14/2016  . Class 1 obesity due to excess calories without serious comorbidity with body mass index (BMI) of 34.0 to 34.9 in adult 05/27/2016  . Chronic cough 12/19/2015  . Pre-syncope 12/19/2015  . Hx of colonic polyps   . Benign neoplasm of sigmoid colon   . Dysphagia, pharyngoesophageal phase 10/01/2014  . Prolapsed, uterovaginal, incomplete 06/24/2014  . Routine general medical examination at a health care facility 12/30/2012  . Obesity (BMI 30-39.9) 12/30/2012  . Hx of multiple pulmonary nodules 12/28/2012  . Plantar fasciitis of right foot 12/28/2012  . 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 Edema of both legs 09/12/2012  . Hip pain, bilateral 09/12/2012  . History of Hodgkin's lymphoma 09/12/2012  . Coronary artery disease   . Hyperlipidemia   . Chest pain on exertion 02/29/2012    Past Surgical History:  Procedure Laterality Date  . bladder sling    . CARDIAC CATHETERIZATION  02/2012   [facility] 1 stent place  . CERVICAL POLYPECTOMY    . CHOLECYSTECTOMY  1982  . COLONOSCOPY WITH PROPOFOL N/A 02/05/2015   Procedure: COLONOSCOPY WITH PROPOFOL;  Surgeon: Lucilla Lame, MD;  Location: [facility] ENDOSCOPY;  Service: Endoscopy;  Laterality: N/A;  . CORONARY ANGIOPLASTY  02/2012   left/right s/p balloon  . heart stent'  2013  . LYMPH NODE BIOPSY  2011   diagnosis of hodgkins lymphoma  . PELVIC LYMPH NODE DISSECTION N/A 07/29/2016   Procedure: PELVIC/AORTIC LYMPH NODE SAMPLING;  Surgeon: [name], MD;  Location: [facility] ORS;  Service: Gynecology;  Laterality: N/A;  . ROBOTIC ASSISTED TOTAL HYSTERECTOMY WITH BILATERAL SALPINGO OOPHERECTOMY N/A 07/29/2016   Procedure: ROBOTIC ASSISTED TOTAL HYSTERECTOMY WITH BILATERAL SALPINGO OOPHORECTOMY;  Surgeon: [name], MD;  Location: [facility] ORS;  Service: Gynecology;  Laterality: N/A;  . SENTINEL NODE BIOPSY N/A 07/29/2016   Procedure: SENTINEL NODE BIOPSY;  Surgeon: [name], MD;  Location: [facility] ORS;  Service: Gynecology;  Laterality: N/A;  . transobturator sling N/A 2009   [place]    Prior to Admission medications   Medication Sig Start Date End Date Taking? Authorizing Provider  acetaminophen (TYLENOL) 500 MG tablet Take 1,000 mg by mouth every 6 (six) hours as needed.   Yes Historical Provider, MD  aspirin EC 81 MG tablet Take 81 mg by mouth at bedtime.   Yes Historical Provider, MD  atorvastatin (LIPITOR) 10 MG tablet TAKE ONE TABLET BY MOUTH ONCE DAILY Patient taking differently: Take 10 mg by mouth at bedtime.  03/27/14  Yes Wellington Hampshire, MD  b complex vitamins tablet Take 1 tablet by mouth at bedtime.   Yes Historical Provider, MD  Cholecalciferol (VITAMIN D3) 2000 units TABS Take 2,000 Units by mouth 3 (three) times a week.   Yes Historical Provider, MD  diazepam (VALIUM) 5 MG tablet Take 1 tablet (5 mg total) by mouth every 12 (twelve) hours as needed for anxiety. Patient taking differently: Take 2.5 mg by mouth every 12 (twelve) hours as needed for anxiety.  03/03/16  Yes [name], MD  esomeprazole (NEXIUM) 20 MG capsule Take 20 mg by mouth daily before breakfast.    Yes Historical Provider, MD  ibuprofen (ADVIL,MOTRIN) 200 MG tablet Take 200 mg by mouth every 6 (six) hours as needed.   Yes Historical Provider, MD  metoprolol tartrate (LOPRESSOR) 25 MG tablet TAKE ONE TABLET BY MOUTH TWICE DAILY 06/11/16  Yes Wellington Hampshire, MD  naproxen sodium (ANAPROX) 220 MG tablet Take 440 mg by mouth 2 (two) times daily as needed (for pain/headache.).   Yes Historical Provider, MD  nitroGLYCERIN (NITROSTAT) 0.4 MG SL tablet Place 1 tablet (0.4 mg total) under the tongue every 5 (five) minutes as needed for chest pain. 02/22/13  Yes Minna Merritts, MD  Turmeric 500 MG CAPS Take  500 mg by mouth 2 (two) times daily.   Yes Historical Provider, MD  vitamin B-12 (CYANOCOBALAMIN) 1000 MCG tablet Take 1,000 mcg by mouth daily.   Yes Historical Provider, MD  amoxicillin-clavulanate (AUGMENTIN) 875-125 MG tablet Take 1 tablet by mouth 2 (two) times daily. 08/07/16   Chelsea C Ward, MD  oxyCODONE (ROXICODONE) 5 MG immediate release tablet Take 1 tablet (5 mg total) by mouth every 4 (four) hours as needed for severe pain. Patient not taking: Reported on 08/05/2016 07/29/16   Honor Loh Ward, MD    Allergies Adhesive [tape]; Z-pak [azithromycin]; Antifungal [miconazole nitrate]; and Sulfa antibiotics  Family History  Problem Relation Age of Onset  . ALS Father   . Polymyositis Father   . Diabetes Brother   . Cancer Maternal Aunt     breast  . Breast cancer Maternal Aunt     30's  . Stroke Maternal Grandmother   . Cancer Maternal Grandfather     prostate  . Stroke Maternal Grandfather  Social History Social History  Substance Use Topics  . Smoking status: Former Smoker    Packs/day: 1.00    Years: 30.00    Types: Cigarettes    Quit date: 07/24/2002  . Smokeless tobacco: Never Used     Comment: quit smoking in 2000  . Alcohol use Yes     Comment: occasional, 1-2 drinks per month    Review of Systems Constitutional: Positive for fever Eyes: No visual changes. ENT: No sore throat. Cardiovascular: Denies chest pain. Respiratory: Denies shortness of breath. Gastrointestinal: Positive for abdominal pain. Genitourinary: Negative for dysuria. Musculoskeletal: Negative for back pain. Skin: Negative for rash. Neurological: Negative for headaches, focal weakness or numbness.  10-point ROS otherwise negative.  ____________________________________________   PHYSICAL EXAM:  VITAL SIGNS: ED Triage Vitals [08/05/16 1705]  Enc Vitals Group     BP      Pulse Rate (!) 51     Resp 18     Temp 98.7 F (37.1 C)     Temp Source Oral     SpO2 93 %     Weight 173  lb (78.5 kg)     Height 5\' 1"  (1.549 m)     Head Circumference      Peak Flow      Pain Score 0     Pain Loc      Pain Edu?      Excl. in [place]?     Constitutional: Alert and oriented 4 appears somewhat uncomfortable no diaphoresis Eyes: Conjunctivae are normal. PERRL. EOMI. Head: Atraumatic. Nose: No congestion/rhinnorhea. Mouth/Throat: Mucous membranes are moist.  Oropharynx non-erythematous. Neck: No stridor.   Cardiovascular: Bradycardic rate, regular rhythm. Grossly normal heart sounds.  Good peripheral circulation. Respiratory: Normal respiratory effort.  No retractions. Lungs CTAB. Gastrointestinal: No frank peritonitis but significant tenderness in left lower quadrant with rebound and guarding. Musculoskeletal: No lower extremity tenderness nor edema.  No joint effusions. Neurologic:  Normal speech and language. No gross focal neurologic deficits are appreciated. No gait instability. Skin:  Skin is warm, dry and intact. No rash noted. Psychiatric: Mood and affect are normal. Speech and behavior are normal.  ____________________________________________   LABS (all labs ordered are listed, but only abnormal results are displayed)  Labs Reviewed  COMPREHENSIVE METABOLIC PANEL - Abnormal; Notable for the following:       Result Value   Glucose, Bld 103 (*)    Creatinine, Ser 1.05 (*)    Calcium 8.8 (*)    Total Bilirubin 1.8 (*)    GFR calc non Af Amer 54 (*)    All other components within normal limits  CBC WITH DIFFERENTIAL/PLATELET - Abnormal; Notable for the following:    WBC 24.1 (*)    Neutro Abs 21.2 (*)    Monocytes Absolute 1.5 (*)    All other components within normal limits  URINALYSIS, COMPLETE (UACMP) WITH MICROSCOPIC - Abnormal; Notable for the following:    Color, Urine YELLOW (*)    APPearance CLEAR (*)    Hgb urine dipstick MODERATE (*)    Ketones, ur 5 (*)    Protein, ur 100 (*)    Leukocytes, UA MODERATE (*)    Squamous Epithelial / LPF 0-5 (*)      All other components within normal limits  TROPONIN I - Abnormal; Notable for the following:    Troponin I 0.06 (*)    All other components within normal limits  BRAIN NATRIURETIC PEPTIDE - Abnormal; Notable for the following:  B Natriuretic Peptide 667.0 (*)    All other components within normal limits  CBC WITH DIFFERENTIAL/PLATELET - Abnormal; Notable for the following:    WBC 17.4 (*)    Hemoglobin 11.7 (*)    HCT 34.0 (*)    Neutro Abs 15.3 (*)    Lymphs Abs 0.9 (*)    Monocytes Absolute 1.1 (*)    All other components within normal limits  CBC WITH DIFFERENTIAL/PLATELET - Abnormal; Notable for the following:    RBC 3.57 (*)    Hemoglobin 11.0 (*)    HCT 31.3 (*)    Neutro Abs 8.6 (*)    Lymphs Abs 0.9 (*)    All other components within normal limits  BASIC METABOLIC PANEL - Abnormal; Notable for the following:    Potassium 3.0 (*)    Glucose, Bld 104 (*)    Calcium 8.2 (*)    All other components within normal limits  TROPONIN I - Abnormal; Notable for the following:    Troponin I 0.10 (*)    All other components within normal limits  TROPONIN I - Abnormal; Notable for the following:    Troponin I 0.10 (*)    All other components within normal limits  CULTURE, BLOOD (ROUTINE X 2)  CULTURE, BLOOD (ROUTINE X 2)  URINE CULTURE  INFLUENZA PANEL BY PCR (TYPE A & B)  LACTIC ACID, PLASMA  LIPASE, BLOOD  PROCALCITONIN  PROTIME-INR   ____________________________________________  EKG  ED ECG REPORT I, Darel Hong, the attending physician, personally viewed and interpreted this ECG.  Date: 08/20/2016 Rate: 97 Rhythm: normal sinus rhythm QRS Axis: normal Intervals: Prolonged QTC ST/T Wave abnormalities: Diffuse ST depression no frank ST elevation Conduction Disturbances: none Narrative Interpretation: Abnormal EKG  ____________________________________________  RADIOLOGY   ____________________________________________   PROCEDURES  Procedure(s)  performed: no  Procedures  Critical Care performed: yes  CRITICAL CARE Performed by: Darel Hong   Total critical care time: 35 minutes  Critical care time was exclusive of separately billable procedures and treating other patients.  Critical care was necessary to treat or prevent imminent or life-threatening deterioration.  Critical care was time spent personally by me on the following activities: development of treatment plan with patient and/or surrogate as well as nursing, discussions with consultants, evaluation of patient's response to treatment, examination of patient, obtaining history from patient or surrogate, ordering and performing treatments and interventions, ordering and review of laboratory studies, ordering and review of radiographic studies, pulse oximetry and re-evaluation of patient's condition.   ____________________________________________   INITIAL IMPRESSION / ASSESSMENT AND PLAN / ED COURSE  Pertinent labs & imaging results that were available during my care of the patient were reviewed by me and considered in my medical decision making (see chart for details).  The patient is febrile with abdominal pain shortly postop. According to the operative notes the patient's left ovary was close to the bowel which raises concern for bowel injury. She requires broad intravenous antibiotics fluid resuscitation and a CT scan with oral and IV contrast. I discussed the case with the patient's gynecologist who is graciously agreed to admit the patient to her service.      ____________________________________________   FINAL CLINICAL IMPRESSION(S) / ED DIAGNOSES  Final diagnoses:  Sepsis, due to unspecified organism [facility])  NSTEMI (non-ST elevated myocardial infarction) [facility])      NEW MEDICATIONS STARTED DURING THIS VISIT:  Discharge Medication List as of 08/07/2016  4:54 PM    START taking these medications  Details  amoxicillin-clavulanate (AUGMENTIN)  875-125 MG tablet Take 1 tablet by mouth 2 (two) times daily., Starting Fri 08/07/2016, Phone In         Note:  This document was prepared using Dragon voice recognition software and may include unintentional dictation errors.     Darel Hong, MD 08/08/16 [id number]    Darel Hong, MD 08/20/16 1020

## 2016-08-05 NOTE — H&P (Signed)
Consult History and Physical   SERVICE: Gynecology  Patient Name: Jaime Hall Patient MRN:   [id number]  CC: fever  HPI: Jaime Hall is a 66 y.o. s/p Robotic Assisted Total Laparoscopic Hysterectomy Bilateral Salpingo-Oophorectomy and Sentinel Pelvic Lymph Node Dissection on 07/30/15 for endometrial cancer with two successive nights of fever >101.  She has some LLQ pain when moving her leg that started just prior to heading to the hospital.  Had 1 episode of diarrhea then a normal stool in the last 2 days.  Had not had a BM prior to this since surgery.  Surgery involved extensive dissection of the rectum which was densely adherent to the posterior uterus and left ovary.  Bubble test intraoperatively was negative for rectal injury.    Has some leg edema due to chronic venous insufficiency.  Had angioplasty and cardiac stents in 2013 and was on Plavix for three years, now just ASA.  No chest pain and lives alone and works and is highly functional. Flu in January, s/p Tamiflu with resolution   Review of Systems: positives in bold GEN:   fevers, chills, weight changes, appetite changes, fatigue, night sweats HEENT:  HA, vision changes, hearing loss, congestion, rhinorrhea, sinus pressure, dysphagia CV:   CP, palpitations PULM:  SOB, cough GI:  abd pain, N/V/D/C GU:  dysuria, urgency, frequency MSK:  arthralgias, myalgias, back pain, swelling SKIN:  rashes, color changes, pallor NEURO:  numbness, weakness, tingling, seizures, dizziness, tremors PSYCH:  depression, anxiety, behavioral problems, confusion  HEME/LYMPH:  easy bruising or bleeding ENDO:  heat/cold intolerance  Past Obstetrical History: OB History    G2P2      Past Gynecologic History: Recent diagnosis of endometrial cancer - pathology pending, had PMB   Past Medical History: Past Medical History:  Diagnosis Date  . Anxiety   . Cervicalgia   . Coronary artery disease 02/2012   Abnormal stress test with  anterior wall ischemia. Cardiac catheterization showed a 95% stenosis in the proximal LAD bifurcating with a 90% stenosis in first diagonal. Ejection fraction was 45% with anterior wall hypokinesis. She underwent balloon angioplasty to ostial first diagonal and drug-eluting stent placement to proximal LAD with a 3.0 x 15 mm Xience EX drug-eluting stent  . Endometrial cancer ([place])   . Essential hypertension, benign   . Fibrocystic breast disease   . GERD (gastroesophageal reflux disease)   . Gestational hypertension   . Heart disease   . Heart murmur   . History of anemia   . History of blood transfusion   . Hodgkin's lymphoma ([place]) 2011   s/p radiation and chemo therapy  . Osteoarthritis   . Polycystic ovarian disease     Past Surgical History:   Past Surgical History:  Procedure Laterality Date  . bladder sling    . CARDIAC CATHETERIZATION  02/2012   [facility] 1 stent place  . CERVICAL POLYPECTOMY    . CHOLECYSTECTOMY  1982  . COLONOSCOPY WITH PROPOFOL N/A 02/05/2015   Procedure: COLONOSCOPY WITH PROPOFOL;  Surgeon: Lucilla Lame, MD;  Location: [facility] ENDOSCOPY;  Service: Endoscopy;  Laterality: N/A;  . CORONARY ANGIOPLASTY  02/2012   left/right s/p balloon  . heart stent'  2013  . LYMPH NODE BIOPSY  2011   diagnosis of hodgkins lymphoma  . PELVIC LYMPH NODE DISSECTION N/A 07/29/2016   Procedure: PELVIC/AORTIC LYMPH NODE SAMPLING;  Surgeon: [name], MD;  Location: [facility] ORS;  Service: Gynecology;  Laterality: N/A;  . ROBOTIC ASSISTED TOTAL HYSTERECTOMY WITH  BILATERAL SALPINGO OOPHERECTOMY N/A 07/29/2016   Procedure: ROBOTIC ASSISTED TOTAL HYSTERECTOMY WITH BILATERAL SALPINGO OOPHORECTOMY;  Surgeon: [name], MD;  Location: [facility] ORS;  Service: Gynecology;  Laterality: N/A;  . SENTINEL NODE BIOPSY N/A 07/29/2016   Procedure: SENTINEL NODE BIOPSY;  Surgeon: [name], MD;  Location: [facility] ORS;  Service: Gynecology;  Laterality: N/A;  . transobturator  sling N/A 2009   [place]    Family History:  family history includes ALS in her father; Breast cancer in her maternal aunt; Cancer in her maternal aunt and maternal grandfather; Diabetes in her brother; Polymyositis in her father; Stroke in her maternal grandfather and maternal grandmother.  Social History:  Social History   Social History  . Marital status: Widowed    Spouse name: N/A  . Number of children: N/A  . Years of education: N/A   Occupational History  . School Nutritionist   Social History Main Topics  . Smoking status: Former Smoker    Packs/day: 1.00    Years: 30.00    Types: Cigarettes    Quit date: 07/24/2002  . Smokeless tobacco: Never Used     Comment: quit smoking in 2000  . Alcohol use Yes     Comment: occasional, 1-2 drinks per month  . Drug use: No  . Sexual activity: No   Other Topics Concern  . Not on file   Social History Narrative  . No narrative on file    Home Medications:  Medications reconciled in EPIC  No current facility-administered medications on file prior to encounter.    Current Outpatient Prescriptions on File Prior to Encounter  Medication Sig Dispense Refill  . aspirin EC 81 MG tablet Take 81 mg by mouth at bedtime.    Marland Kitchen atorvastatin (LIPITOR) 10 MG tablet TAKE ONE TABLET BY MOUTH ONCE DAILY (Patient taking differently: Take 10 mg by mouth at bedtime. ) 90 tablet 3  . b complex vitamins tablet Take 1 tablet by mouth at bedtime.    . Cholecalciferol (VITAMIN D3) 2000 units TABS Take 2,000 Units by mouth 3 (three) times a week.    . diazepam (VALIUM) 5 MG tablet Take 1 tablet (5 mg total) by mouth every 12 (twelve) hours as needed for anxiety. (Patient taking differently: Take 2.5 mg by mouth every 12 (twelve) hours as needed for anxiety. ) 30 tablet 1  . esomeprazole (NEXIUM) 20 MG capsule Take 20 mg by mouth daily before breakfast.     . metoprolol tartrate (LOPRESSOR) 25 MG tablet TAKE ONE TABLET BY MOUTH TWICE DAILY 180  tablet 2  . naproxen sodium (ANAPROX) 220 MG tablet Take 440 mg by mouth 2 (two) times daily as needed (for pain/headache.).    Marland Kitchen nitroGLYCERIN (NITROSTAT) 0.4 MG SL tablet Place 1 tablet (0.4 mg total) under the tongue every 5 (five) minutes as needed for chest pain. 25 tablet 3  . Turmeric 500 MG CAPS Take 500 mg by mouth 2 (two) times daily.    . vitamin B-12 (CYANOCOBALAMIN) 1000 MCG tablet Take 1,000 mcg by mouth daily.    Marland Kitchen oxyCODONE (ROXICODONE) 5 MG immediate release tablet Take 1 tablet (5 mg total) by mouth every 4 (four) hours as needed for severe pain. (Patient not taking: Reported on 08/05/2016) 21 tablet 0    Allergies:  Allergies  Allergen Reactions  . Adhesive [Tape] Other (See Comments)    Burns the skin  . Z-Pak [Azithromycin] Itching  . Antifungal [Miconazole Nitrate] Rash  . Sulfa  Antibiotics Nausea And Vomiting and Rash    N/T    Physical Exam:  Temp:  [98.7 F (37.1 C)] 98.7 F (37.1 C) (02/21 1705) Pulse Rate:  [51-99] 96 (02/21 2200) Resp:  [18-31] 31 (02/21 2200) BP: (117-149)/(50-107) 128/55 (02/21 2200) SpO2:  [93 %-100 %] 94 % (02/21 2200) Weight:  [78.5 kg (173 lb)] 78.5 kg (173 lb) (02/21 1705)   General Appearance:  Well developed, well nourished, no acute distress, alert and oriented, cooperative and appears stated age 27:  Normocephalic atraumatic, extraocular movements intact, moist mucous membranes, neck supple with midline trachea and thyroid without masses Cardiovascular:  Normal S1/S2, regular rate and rhythm, no murmurs, 2+ distal pulses Pulmonary:  clear to auscultation, no wheezes, rales or rhonchi, symmetric air entry, good air exchange Abdomen:  Bowel sounds present, soft, nontender, nondistended, no abnormal masses or organomegaly, no epigastric pain Back: inspection of back is normal Extremities:  extremities normal, no tenderness, atraumatic, no cyanosis or edema Skin:  normal coloration and turgor, no rashes, no suspicious skin  lesions noted  Neurologic:  Cranial nerves 2-12 grossly intact, grossly equal strength and muscle tone, normal speech, no focal findings or movement disorder noted. Psychiatric:  Normal mood and affect, appropriate, no AH/VH Pelvic:  Deferred due to recent surgery   Labs/Studies:   CBC and Coags:  Lab Results  Component Value Date   WBC 24.1 (H) 08/05/2016   NEUTOPHILPCT 89 08/05/2016   EOSPCT 0 08/05/2016   BASOPCT 0 08/05/2016   LYMPHOPCT 5 08/05/2016   HGB 12.6 08/05/2016   HCT 36.8 08/05/2016   MCV 88.2 08/05/2016   PLT 178 08/05/2016   INR 1.02 08/05/2016   CMP:  Lab Results  Component Value Date   NA 137 08/05/2016   K 3.6 08/05/2016   CL 102 08/05/2016   CO2 27 08/05/2016   BUN 12 08/05/2016   CREATININE 1.05 (H) 08/05/2016   CREATININE 1.29 (H) 07/24/2016   CREATININE 1.18 (H) 07/14/2016   PROT 7.0 08/05/2016   BILITOT 1.8 (H) 08/05/2016   BILIDIR 0.1 04/11/2015   ALT 29 08/05/2016   AST 24 08/05/2016   ALKPHOS 96 08/05/2016   Other Labs: Results for orders placed or performed during the hospital encounter of 08/05/16 (from the past   Comprehensive metabolic panel     Status: Abnormal   Collection Time: 08/05/16  5:05 PM  Result Value Ref Range   Sodium 137 135 - 145 mmol/L   Potassium 3.6 3.5 - 5.1 mmol/L   Chloride 102 101 - 111 mmol/L   CO2 27 22 - 32 mmol/L   Glucose, Bld 103 (H) 65 - 99 mg/dL   BUN 12 6 - 20 mg/dL   Creatinine, Ser 1.05 (H) 0.44 - 1.00 mg/dL   Calcium 8.8 (L) 8.9 - 10.3 mg/dL   Total Protein 7.0 6.5 - 8.1 g/dL   Albumin 3.5 3.5 - 5.0 g/dL   AST 24 15 - 41 U/L   ALT 29 14 - 54 U/L   Alkaline Phosphatase 96 38 - 126 U/L   Total Bilirubin 1.8 (H) 0.3 - 1.2 mg/dL   GFR calc non Af Amer 54 (L) >60 mL/min   GFR calc Af Amer >60 >60 mL/min    Comment: (NOTE) The eGFR has been calculated using the CKD EPI equation. This calculation has not been validated in all clinical situations. eGFR's persistently <60 mL/min signify possible  Chronic Kidney Disease.    Anion gap 8 5 - 15  CBC with Differential     Status: Abnormal   Collection Time: 08/05/16  5:05 PM  Result Value Ref Range   WBC 24.1 (H) 3.6 - 11.0 K/uL   RBC 4.17 3.80 - 5.20 MIL/uL   Hemoglobin 12.6 12.0 - 16.0 g/dL   HCT 36.8 35.0 - 47.0 %   MCV 88.2 80.0 - 100.0 fL   MCH 30.2 26.0 - 34.0 pg   MCHC 34.2 32.0 - 36.0 g/dL   RDW 14.2 11.5 - 14.5 %   Platelets 178 150 - 440 K/uL   Neutrophils Relative % 89 %   Neutro Abs 21.2 (H) 1.4 - 6.5 K/uL   Lymphocytes Relative 5 %   Lymphs Abs 1.3 1.0 - 3.6 K/uL   Monocytes Relative 6 %   Monocytes Absolute 1.5 (H) 0.2 - 0.9 K/uL   Eosinophils Relative 0 %   Eosinophils Absolute 0.0 0 - 0.7 K/uL   Basophils Relative 0 %   Basophils Absolute 0.1 0 - 0.1 K/uL  Influenza panel by PCR (type A & B)     Status: None   Collection Time: 08/05/16  6:25 PM  Result Value Ref Range   Influenza A By PCR NEGATIVE NEGATIVE   Influenza B By PCR NEGATIVE NEGATIVE    Comment: (NOTE) The Xpert Xpress Flu assay is intended as an aid in the diagnosis of  influenza and should not be used as a sole basis for treatment.  This  assay is FDA approved for nasopharyngeal swab specimens only. Nasal  washings and aspirates are unacceptable for Xpert Xpress Flu testing.   Lactic acid, plasma     Status: None   Collection Time: 08/05/16  6:25 PM  Result Value Ref Range   Lactic Acid, Venous 1.4 0.5 - 1.9 mmol/L  Lipase, blood     Status: None   Collection Time: 08/05/16  6:25 PM  Result Value Ref Range   Lipase 26 11 - 51 U/L  Troponin I     Status: Abnormal   Collection Time: 08/05/16  6:25 PM  Result Value Ref Range   Troponin I 0.06 (HH) <0.03 ng/mL    Comment: CRITICAL RESULT CALLED TO, READ BACK BY AND VERIFIED WITH KATE BUMGARNER AT 1926 ON 08/05/2016 JLJ   Procalcitonin     Status: None   Collection Time: 08/05/16  6:25 PM  Result Value Ref Range   Procalcitonin 0.69 ng/mL    Comment:        Interpretation: PCT > 0.5  ng/mL and <= 2 ng/mL: Systemic infection (sepsis) is possible, but other conditions are known to elevate PCT as well. (NOTE)         ICU PCT Algorithm               Non ICU PCT Algorithm    ----------------------------     ------------------------------         PCT < 0.25 ng/mL                 PCT < 0.1 ng/mL     Stopping of antibiotics            Stopping of antibiotics       strongly encouraged.               strongly encouraged.    ----------------------------     ------------------------------       PCT level decrease by  PCT < 0.25 ng/mL       >= 80% from peak PCT       OR PCT 0.25 - 0.5 ng/mL          Stopping of antibiotics                                             encouraged.     Stopping of antibiotics           encouraged.    ----------------------------     ------------------------------       PCT level decrease by              PCT >= 0.25 ng/mL       < 80% from peak PCT        AND PCT >= 0.5 ng/mL             Continuing antibiotics                                              encouraged.       Continuing antibiotics            encouraged.    ----------------------------     ------------------------------     PCT level increase compared          PCT > 0.5 ng/mL         with peak PCT AND          PCT >= 0.5 ng/mL             Escalation of antibiotics                                          strongly encouraged.      Escalation of antibiotics        strongly encouraged.   Protime-INR     Status: None   Collection Time: 08/05/16  6:25 PM  Result Value Ref Range   Prothrombin Time 13.4 11.4 - 15.2 seconds   INR 1.02   Brain natriuretic peptide     Status: Abnormal   Collection Time: 08/05/16  6:25 PM  Result Value Ref Range   B Natriuretic Peptide 667.0 (H) 0.0 - 100.0 pg/mL  Urinalysis, Complete w Microscopic     Status: Abnormal   Collection Time: 08/05/16  7:50 PM  Result Value Ref Range   Color, Urine YELLOW (A) YELLOW   APPearance CLEAR (A)  CLEAR   Specific Gravity, Urine 1.018 1.005 - 1.030   pH 6.0 5.0 - 8.0   Glucose, UA NEGATIVE NEGATIVE mg/dL   Hgb urine dipstick MODERATE (A) NEGATIVE   Bilirubin Urine NEGATIVE NEGATIVE   Ketones, ur 5 (A) NEGATIVE mg/dL   Protein, ur 100 (A) NEGATIVE mg/dL   Nitrite NEGATIVE NEGATIVE   Leukocytes, UA MODERATE (A) NEGATIVE   RBC / HPF 0-5 0 - 5 RBC/hpf   WBC, UA TOO NUMEROUS TO COUNT 0 - 5 WBC/hpf   Bacteria, UA NONE SEEN NONE SEEN   Squamous Epithelial / LPF 0-5 (A) NONE SEEN   Granular Casts, UA PRESENT      Other Imaging: Ct Abdomen Pelvis  W Contrast  Result Date: 08/05/2016 CLINICAL DATA:  66 year old female with recent hysterectomy presenting with fever and abdominal pain. EXAM: CT ABDOMEN AND PELVIS WITH CONTRAST TECHNIQUE: Multidetector CT imaging of the abdomen and pelvis was performed using the standard protocol following bolus administration of intravenous contrast. CONTRAST:  11m ISOVUE-300 IOPAMIDOL (ISOVUE-300) INJECTION 61% COMPARISON:  Pelvic ultrasound dated 02/01 07/14/2016 FINDINGS: Lower chest: Partially visualized airspace density with air bronchogram in the left lower lobe may represent atelectatic changes/scarring with associated traction bronchiectasis. Pneumonia is not excluded. The visualized lung bases are otherwise clear. A coronary vascular stents noted in the proximal LAD. There is no intra-abdominal free air. Small free fluid may be present within the pelvis. Hepatobiliary: Cholecystectomy. Mild biliary ductal dilatation, likely post cholecystectomy. The liver is unremarkable. Pancreas: Unremarkable. No pancreatic ductal dilatation or surrounding inflammatory changes. Spleen: Normal in size without focal abnormality. Adrenals/Urinary Tract: The adrenal glands are unremarkable. There is a 2.5 cm cyst in the inferior pole of the right kidney. Subcentimeter right renal hypodense lesions are too small to characterize but likely represent cysts. The left kidney is  unremarkable. There is no hydronephrosis on either side. There is symmetric uptake and excretion of contrast by kidneys bilaterally. The visualized ureters and the urinary bladder appear unremarkable as visualized. There is no extravasation of excreted contrast outside of the confines of the ureters or the bladder. Diffuse perivesical haziness likely related to recent surgery and inflammatory changes of the pelvis. Correlation with urinalysis recommended to exclude cystitis. Stomach/Bowel: There is extensive sigmoid diverticulosis without active inflammatory changes. There is no evidence of bowel obstruction or active inflammation. Normal appendix. Vascular/Lymphatic: There is moderate aortoiliac atherosclerotic disease. The origins of the celiac axis, SMA, IMA as well as the origins of the renal arteries are patent. The SMV, splenic vein, and main portal vein are patent. No portal venous gas identified. There is no adenopathy. The IVC is unremarkable. Reproductive: This postsurgical changes of hysterectomy. Small pockets of air in the posterior pelvic floor likely postsurgical. There is a 1.8 x 3.8 cm complex fluid collection containing pockets of air with somewhat organized walls within the pelvis at the hysterectomy bed most consistent with developing abscess. There is superior extension of loculated fluid along the left lateral pelvic wall anterior to the left psoas muscle. The component of the loculated fluid along the left lateral pelvic wall measures 2.0 x 4.0 cm and fluid collection along the anterior surface of the left psoas muscle measures approximately 1.8 x 3.6 cm. These collections communicate with each other. Other: There is postsurgical changes of the midline anterior abdominal wall superior to the umbilicus likely related to laparoscopic port placement. A 1.5 x 1.5 cm fluid collection in the supraumbilical subcutaneous soft tissues noted. Small amount of gas noted in the subcutaneous soft tissues of  the left anterior pelvic wall likely postsurgical. Musculoskeletal: There is degenerative changes of the spine. No acute fractures. IMPRESSION: 1. Postsurgical changes of hysterectomy with a small somewhat organized complex fluid collection at the hysterectomy bed within the pelvic floor with superior extension along the left lateral pelvic sidewall most compatible with developing abscess. 2. No definite evidence of traumatic injury to the bowel or urinary system. 3. Small fluid collection in the supraumbilical subcutaneous fat likely in the region of laparoscopic port placement. 4. Sigmoid diverticulosis without active inflammatory changes. No bowel obstruction. Normal appendix. 5. Aortoiliac atherosclerotic disease. Electronically Signed   By: AAnner CreteM.D.   On: 08/05/2016 22:10  Dg Chest Port 1 View  Result Date: 08/05/2016 CLINICAL DATA:  Fever. Sepsis. One week postop from hysterectomy. Personal history of Hodgkin's lymphoma. EXAM: PORTABLE CHEST 1 VIEW COMPARISON:  07/24/2016, and chest CT on 12/24/2015 FINDINGS: Heart size remains normal. Paramediastinal radiation changes are stable. No evidence of acute infiltrate or pulmonary edema. No evidence of pneumothorax or pleural effusion. IMPRESSION: Stable paramediastinal radiation changes.  No active disease. Electronically Signed   By: Earle Gell M.D.   On: 08/05/2016 19:08     Assessment / Plan:   LANDEN BREELAND is a 66 y.o. No obstetric history on file. who presents with Fever of unknown origin, SIRS  1. FUO: currently afebrile  Pelvic collection could be abscess forming or formed, would also explain LLQ pain.  If labile temps could be septic thrombophlebitis.    -zosyn started in ED and will continue x 48hrs  -trend WBC  -tylenol PRN  -consider re-imaging if fevers do not break or leukocytosis does not improve  -IVF   -blood and urine cultures pending  2. Post-op: routine cares, encourage OOB  -if LLQ pain does not improve  will consider PT consult 3.  Cardiac history  -troponin and BNP elevated but in setting of fever and history of CAD will repeat. 4.  Disposition  -inpatient admission     ----- Larey Days, MD Attending Obstetrician and Gynecologist [facility], Department of [facility]

## 2016-08-05 NOTE — Progress Notes (Signed)
ANTIBIOTIC CONSULT NOTE - INITIAL  Pharmacy Consult for Zosyn dosing Indication: sepsis  Allergies  Allergen Reactions  . Adhesive [Tape] Other (See Comments)    Burns the skin  . Z-Pak [Azithromycin] Itching  . Antifungal [Miconazole Nitrate] Rash  . Sulfa Antibiotics Nausea And Vomiting and Rash    N/T    Patient Measurements: Height: 5\' 1"  (154.9 cm) Weight: 173 lb (78.5 kg) IBW/kg (Calculated) : 47.8 Adjusted Body Weight:  Vital Signs: Temp: 98.7 F (37.1 C) (02/21 1705) Temp Source: Oral (02/21 1705) Pulse Rate: 51 (02/21 1705) Intake/Output from previous day: No intake/output data recorded. Intake/Output from this shift: No intake/output data recorded.  Labs:  Recent Labs  08/05/16 1705  WBC 24.1*  HGB 12.6  PLT 178  CREATININE 1.05*   Estimated Creatinine Clearance: 50 mL/min (by C-G formula based on SCr of 1.05 mg/dL (H)). No results for input(s): VANCOTROUGH, VANCOPEAK, VANCORANDOM, GENTTROUGH, GENTPEAK, GENTRANDOM, TOBRATROUGH, TOBRAPEAK, TOBRARND, AMIKACINPEAK, AMIKACINTROU, AMIKACIN in the last 72 hours.   Microbiology: Recent Results (from the past 720 hour(s))  Urine Culture     Status: None   Collection Time: 07/07/16  2:18 PM  Result Value Ref Range Status   Organism ID, Bacteria   Final    Multiple organisms present,each less than 10,000 CFU/mL. These organisms,commonly found on external and internal genitalia,are considered colonizers. No further testing performed.   Urine culture     Status: Abnormal   Collection Time: 07/14/16 10:43 AM  Result Value Ref Range Status   Specimen Description URINE, RANDOM  Final   Special Requests Normal  Final   Culture (A)  Final    <10,000 COLONIES/mL INSIGNIFICANT GROWTH Performed at [facility], [address], [place], [place] [address]    Report Status 07/16/2016 FINAL  Final    Medical History: Past Medical History:  Diagnosis Date  . Anxiety   . Cervicalgia   . Coronary artery  disease 02/2012   Abnormal stress test with anterior wall ischemia. Cardiac catheterization showed a 95% stenosis in the proximal LAD bifurcating with a 90% stenosis in first diagonal. Ejection fraction was 45% with anterior wall hypokinesis. She underwent balloon angioplasty to ostial first diagonal and drug-eluting stent placement to proximal LAD with a 3.0 x 15 mm Xience EX drug-eluting stent  . Endometrial cancer ([place])   . Essential hypertension, benign   . Fibrocystic breast disease   . GERD (gastroesophageal reflux disease)   . Gestational hypertension   . Heart disease   . Heart murmur   . History of anemia   . History of blood transfusion   . Hodgkin's lymphoma ([place]) 2011   s/p radiation and chemo therapy  . Osteoarthritis   . Polycystic ovarian disease     Medications:   Assessment: Patient admitted for sepsis w/ h/o endometrial cancer. Being started on Zosyn   Goal of Therapy:  Resolution of signs/symptoms of infection  Plan:  Will start Zosyn 3.375g IV q8h over 4 hours as extended infusion. Follow up culture results  Thank you for this consult.  Tobie Lords, PharmD, BCPS Clinical Pharmacist 08/05/2016

## 2016-08-05 NOTE — ED Notes (Signed)
Pt had diarrhea from oral contrast. Ambulatory to toilet to and given wipes. Pt given brief to put on. States she threw her underwear away. Given pt belongings bag from pj pants.

## 2016-08-06 LAB — CBC WITH DIFFERENTIAL/PLATELET
Basophils Absolute: 0 10*3/uL (ref 0–0.1)
Basophils Relative: 0 %
Eosinophils Absolute: 0 10*3/uL (ref 0–0.7)
Eosinophils Relative: 0 %
HCT: 34 % — ABNORMAL LOW (ref 35.0–47.0)
Hemoglobin: 11.7 g/dL — ABNORMAL LOW (ref 12.0–16.0)
Lymphocytes Relative: 6 %
Lymphs Abs: 0.9 10*3/uL — ABNORMAL LOW (ref 1.0–3.6)
MCH: 30.1 pg (ref 26.0–34.0)
MCHC: 34.3 g/dL (ref 32.0–36.0)
MCV: 87.5 fL (ref 80.0–100.0)
Monocytes Absolute: 1.1 10*3/uL — ABNORMAL HIGH (ref 0.2–0.9)
Monocytes Relative: 6 %
Neutro Abs: 15.3 10*3/uL — ABNORMAL HIGH (ref 1.4–6.5)
Neutrophils Relative %: 88 %
Platelets: 157 10*3/uL (ref 150–440)
RBC: 3.88 MIL/uL (ref 3.80–5.20)
RDW: 14.1 % (ref 11.5–14.5)
WBC: 17.4 10*3/uL — ABNORMAL HIGH (ref 3.6–11.0)

## 2016-08-06 NOTE — Progress Notes (Signed)
Obstetric and Gynecology  HD # 2  Subjective  Patient doing well, no complaints, tolerating PO intake, tolerating PO meds, ambulating without difficulty, voiding spontaneously.   No fevers overnight.  Still having LLQ pain, difficulty with leg lifting.  Otherwise no complaints.    She remains on telemetry, but would like a short break to take a shower.  Had diarrhea after contrast, but a softer more formed stool after that.  Denies CP, SOB, F/C, N/V/D, or leg pain.  No palpitations.   Objective  Objective:      Current Vital Signs 24h Vital Sign Ranges  T 98.5 F (36.9 C) Temp  Avg: 98.9 F (37.2 C)  Min: 97.8 F (36.6 C)  Max: 100.2 F (37.9 C)  BP (!) 122/53 BP  Min: 102/55  Max: 149/57  HR 87 Pulse  Avg: 88.6  Min: 51  Max: 99  RR 16 Resp  Avg: 23.2  Min: 16  Max: 31  SaO2 99 % Not Delivered SpO2  Avg: 97.7 %  Min: 93 %  Max: 100 %           24 Hour I/O Current Shift I/O  Time Ins Outs 02/21 0701 - 02/22 0700 In: 931.7 [P.O.:240; I.V.:641.7] Out: -  02/22 0701 - 02/22 1900 In: 290 [P.O.:240] Out: -    General: NAD Cardiovascular: RRR, no murmurs Pulmonary: CTAB, normal respiratory effort Abdomen: Benign. Non-tender, +BS, no guarding. Incisions c/d/i Extremities: No erythema or cords, no calf tenderness, with normal peripheral pulses.  Labs: Results for orders placed or performed during the hospital encounter of 08/05/16 (from the past 24 hour(s))  Comprehensive metabolic panel     Status: Abnormal   Collection Time: 08/05/16  5:05 PM  Result Value Ref Range   Sodium 137 135 - 145 mmol/L   Potassium 3.6 3.5 - 5.1 mmol/L   Chloride 102 101 - 111 mmol/L   CO2 27 22 - 32 mmol/L   Glucose, Bld 103 (H) 65 - 99 mg/dL   BUN 12 6 - 20 mg/dL   Creatinine, Ser 1.05 (H) 0.44 - 1.00 mg/dL   Calcium 8.8 (L) 8.9 - 10.3 mg/dL   Total Protein 7.0 6.5 - 8.1 g/dL   Albumin 3.5 3.5 - 5.0 g/dL   AST 24 15 - 41 U/L   ALT 29 14 - 54 U/L   Alkaline Phosphatase 96 38 - 126  U/L   Total Bilirubin 1.8 (H) 0.3 - 1.2 mg/dL   GFR calc non Af Amer 54 (L) >60 mL/min   GFR calc Af Amer >60 >60 mL/min   Anion gap 8 5 - 15  CBC with Differential     Status: Abnormal   Collection Time: 08/05/16  5:05 PM  Result Value Ref Range   WBC 24.1 (H) 3.6 - 11.0 K/uL   RBC 4.17 3.80 - 5.20 MIL/uL   Hemoglobin 12.6 12.0 - 16.0 g/dL   HCT 36.8 35.0 - 47.0 %   MCV 88.2 80.0 - 100.0 fL   MCH 30.2 26.0 - 34.0 pg   MCHC 34.2 32.0 - 36.0 g/dL   RDW 14.2 11.5 - 14.5 %   Platelets 178 150 - 440 K/uL   Neutrophils Relative % 89 %   Neutro Abs 21.2 (H) 1.4 - 6.5 K/uL   Lymphocytes Relative 5 %   Lymphs Abs 1.3 1.0 - 3.6 K/uL   Monocytes Relative 6 %   Monocytes Absolute 1.5 (H) 0.2 - 0.9 K/uL   Eosinophils Relative  0 %   Eosinophils Absolute 0.0 0 - 0.7 K/uL   Basophils Relative 0 %   Basophils Absolute 0.1 0 - 0.1 K/uL  Influenza panel by PCR (type A & B)     Status: None   Collection Time: 08/05/16  6:25 PM  Result Value Ref Range   Influenza A By PCR NEGATIVE NEGATIVE   Influenza B By PCR NEGATIVE NEGATIVE  Lactic acid, plasma     Status: None   Collection Time: 08/05/16  6:25 PM  Result Value Ref Range   Lactic Acid, Venous 1.4 0.5 - 1.9 mmol/L  Lipase, blood     Status: None   Collection Time: 08/05/16  6:25 PM  Result Value Ref Range   Lipase 26 11 - 51 U/L  Troponin I     Status: Abnormal   Collection Time: 08/05/16  6:25 PM  Result Value Ref Range   Troponin I 0.06 (HH) <0.03 ng/mL  Procalcitonin     Status: None   Collection Time: 08/05/16  6:25 PM  Result Value Ref Range   Procalcitonin 0.69 ng/mL  Protime-INR     Status: None   Collection Time: 08/05/16  6:25 PM  Result Value Ref Range   Prothrombin Time 13.4 11.4 - 15.2 seconds   INR 1.02   Brain natriuretic peptide     Status: Abnormal   Collection Time: 08/05/16  6:25 PM  Result Value Ref Range   B Natriuretic Peptide 667.0 (H) 0.0 - 100.0 pg/mL  Blood Culture (routine x 2)     Status: None  (Preliminary result)   Collection Time: 08/05/16  6:26 PM  Result Value Ref Range   Specimen Description BLOOD RIGHT AC    Special Requests BOTTLES DRAWN AEROBIC AND ANAEROBIC BCAV    Culture  Setup Time Organism ID to follow    Culture NO GROWTH < 12 HOURS    Report Status PENDING   Blood Culture (routine x 2)     Status: None (Preliminary result)   Collection Time: 08/05/16  6:26 PM  Result Value Ref Range   Specimen Description BLOOD LEFT AC    Special Requests BOTTLES DRAWN AEROBIC AND ANAEROBIC BCAV    Culture NO GROWTH < 12 HOURS    Report Status PENDING   Urinalysis, Complete w Microscopic     Status: Abnormal   Collection Time: 08/05/16  7:50 PM  Result Value Ref Range   Color, Urine YELLOW (A) YELLOW   APPearance CLEAR (A) CLEAR   Specific Gravity, Urine 1.018 1.005 - 1.030   pH 6.0 5.0 - 8.0   Glucose, UA NEGATIVE NEGATIVE mg/dL   Hgb urine dipstick MODERATE (A) NEGATIVE   Bilirubin Urine NEGATIVE NEGATIVE   Ketones, ur 5 (A) NEGATIVE mg/dL   Protein, ur 100 (A) NEGATIVE mg/dL   Nitrite NEGATIVE NEGATIVE   Leukocytes, UA MODERATE (A) NEGATIVE   RBC / HPF 0-5 0 - 5 RBC/hpf   WBC, UA TOO NUMEROUS TO COUNT 0 - 5 WBC/hpf   Bacteria, UA NONE SEEN NONE SEEN   Squamous Epithelial / LPF 0-5 (A) NONE SEEN   Granular Casts, UA PRESENT   CBC WITH DIFFERENTIAL     Status: Abnormal   Collection Time: 08/06/16  5:03 AM  Result Value Ref Range   WBC 17.4 (H) 3.6 - 11.0 K/uL   RBC 3.88 3.80 - 5.20 MIL/uL   Hemoglobin 11.7 (L) 12.0 - 16.0 g/dL   HCT 34.0 (L) 35.0 - 47.0 %  MCV 87.5 80.0 - 100.0 fL   MCH 30.1 26.0 - 34.0 pg   MCHC 34.3 32.0 - 36.0 g/dL   RDW 14.1 11.5 - 14.5 %   Platelets 157 150 - 440 K/uL   Neutrophils Relative % 88 %   Neutro Abs 15.3 (H) 1.4 - 6.5 K/uL   Lymphocytes Relative 6 %   Lymphs Abs 0.9 (L) 1.0 - 3.6 K/uL   Monocytes Relative 6 %   Monocytes Absolute 1.1 (H) 0.2 - 0.9 K/uL   Eosinophils Relative 0 %   Eosinophils Absolute 0.0 0 - 0.7 K/uL    Basophils Relative 0 %   Basophils Absolute 0.0 0 - 0.1 K/uL    Cultures: Results for orders placed or performed during the hospital encounter of 08/05/16  Blood Culture (routine x 2)     Status: None (Preliminary result)   Collection Time: 08/05/16  6:26 PM  Result Value Ref Range Status   Specimen Description BLOOD RIGHT AC  Final   Special Requests BOTTLES DRAWN AEROBIC AND ANAEROBIC BCAV  Final   Culture  Setup Time Organism ID to follow  Final   Culture NO GROWTH < 12 HOURS  Final   Report Status PENDING  Incomplete  Blood Culture (routine x 2)     Status: None (Preliminary result)   Collection Time: 08/05/16  6:26 PM  Result Value Ref Range Status   Specimen Description BLOOD LEFT AC  Final   Special Requests BOTTLES DRAWN AEROBIC AND ANAEROBIC BCAV  Final   Culture NO GROWTH < 12 HOURS  Final   Report Status PENDING  Incomplete    Ct Abdomen Pelvis W Contrast  Result Date: 08/05/2016 CLINICAL DATA:  66 year old female with recent hysterectomy presenting with fever and abdominal pain. EXAM: CT ABDOMEN AND PELVIS WITH CONTRAST TECHNIQUE: Multidetector CT imaging of the abdomen and pelvis was performed using the standard protocol following bolus administration of intravenous contrast. CONTRAST:  173mL ISOVUE-300 IOPAMIDOL (ISOVUE-300) INJECTION 61% COMPARISON:  Pelvic ultrasound dated 02/01 07/14/2016 FINDINGS: Lower chest: Partially visualized airspace density with air bronchogram in the left lower lobe may represent atelectatic changes/scarring with associated traction bronchiectasis. Pneumonia is not excluded. The visualized lung bases are otherwise clear. A coronary vascular stents noted in the proximal LAD. There is no intra-abdominal free air. Small free fluid may be present within the pelvis. Hepatobiliary: Cholecystectomy. Mild biliary ductal dilatation, likely post cholecystectomy. The liver is unremarkable. Pancreas: Unremarkable. No pancreatic ductal dilatation or surrounding  inflammatory changes. Spleen: Normal in size without focal abnormality. Adrenals/Urinary Tract: The adrenal glands are unremarkable. There is a 2.5 cm cyst in the inferior pole of the right kidney. Subcentimeter right renal hypodense lesions are too small to characterize but likely represent cysts. The left kidney is unremarkable. There is no hydronephrosis on either side. There is symmetric uptake and excretion of contrast by kidneys bilaterally. The visualized ureters and the urinary bladder appear unremarkable as visualized. There is no extravasation of excreted contrast outside of the confines of the ureters or the bladder. Diffuse perivesical haziness likely related to recent surgery and inflammatory changes of the pelvis. Correlation with urinalysis recommended to exclude cystitis. Stomach/Bowel: There is extensive sigmoid diverticulosis without active inflammatory changes. There is no evidence of bowel obstruction or active inflammation. Normal appendix. Vascular/Lymphatic: There is moderate aortoiliac atherosclerotic disease. The origins of the celiac axis, SMA, IMA as well as the origins of the renal arteries are patent. The SMV, splenic vein, and main portal vein are  patent. No portal venous gas identified. There is no adenopathy. The IVC is unremarkable. Reproductive: This postsurgical changes of hysterectomy. Small pockets of air in the posterior pelvic floor likely postsurgical. There is a 1.8 x 3.8 cm complex fluid collection containing pockets of air with somewhat organized walls within the pelvis at the hysterectomy bed most consistent with developing abscess. There is superior extension of loculated fluid along the left lateral pelvic wall anterior to the left psoas muscle. The component of the loculated fluid along the left lateral pelvic wall measures 2.0 x 4.0 cm and fluid collection along the anterior surface of the left psoas muscle measures approximately 1.8 x 3.6 cm. These collections  communicate with each other. Other: There is postsurgical changes of the midline anterior abdominal wall superior to the umbilicus likely related to laparoscopic port placement. A 1.5 x 1.5 cm fluid collection in the supraumbilical subcutaneous soft tissues noted. Small amount of gas noted in the subcutaneous soft tissues of the left anterior pelvic wall likely postsurgical. Musculoskeletal: There is degenerative changes of the spine. No acute fractures. IMPRESSION: 1. Postsurgical changes of hysterectomy with a small somewhat organized complex fluid collection at the hysterectomy bed within the pelvic floor with superior extension along the left lateral pelvic sidewall most compatible with developing abscess. 2. No definite evidence of traumatic injury to the bowel or urinary system. 3. Small fluid collection in the supraumbilical subcutaneous fat likely in the region of laparoscopic port placement. 4. Sigmoid diverticulosis without active inflammatory changes. No bowel obstruction. Normal appendix. 5. Aortoiliac atherosclerotic disease. Electronically Signed   By: Anner Crete M.D.   On: 08/05/2016 22:10   Dg Chest Port 1 View  Result Date: 08/05/2016 CLINICAL DATA:  Fever. Sepsis. One week postop from hysterectomy. Personal history of Hodgkin's lymphoma. EXAM: PORTABLE CHEST 1 VIEW COMPARISON:  07/24/2016, and chest CT on 12/24/2015 FINDINGS: Heart size remains normal. Paramediastinal radiation changes are stable. No evidence of acute infiltrate or pulmonary edema. No evidence of pneumothorax or pleural effusion. IMPRESSION: Stable paramediastinal radiation changes.  No active disease. Electronically Signed   By: Earle Gell M.D.   On: 08/05/2016 19:08     Assessment   66 y.o.   Hospital Day: 2  With post op fever, pelvic abscess  Plan   1. Post op fever:  Likely due to abscess (or whatever collection that is in the LLQ).  IF worsens, will get rectal contrast study to look for extravasation.  No  worsening pain after contrast or BMs.    Continue zosyn for 48hrs if remains improving  Leukocytosis improving, trend  2. History of cardiac disease:    Troponin was 0.06 on admission.  I repeated it this morning and was 0.10.  I called her cardiology group and discussed with their on-call physician.  He reassured me that this was a normal and ok number that is not representative of cardiac compromise in Ms. Callins.  I have ordered another to be drawn in 6hrs and he will follow.  If worsening will evaluate patient.  3. Continue inpatient admission  ----- Larey Days, MD Attending Obstetrician and Gynecologist [facility], Department of [facility]

## 2016-08-06 NOTE — Progress Notes (Signed)
Initial Nutrition Assessment  DOCUMENTATION CODES:   Obesity unspecified  INTERVENTION:  Continue to monitor PO intake.  NUTRITION DIAGNOSIS:   Inadequate oral intake related to acute illness, nausea, vomiting as evidenced by per patient/family report.  GOAL:   Patient will meet greater than or equal to 90% of their needs  MONITOR:   PO intake, I & O's, Labs, Weight trends  REASON FOR ASSESSMENT:   Malnutrition Screening Tool    ASSESSMENT:   66 yo female with hysterectomy on 2/14 presenting to ED with fever. PMHx significant of COPD, HTN, recent uterine cancer dx, Hodgkins Lymphoma in remission, Heart disease, GERD, CAD.  Patient reported diarrhea this am and vomiting yesterday morning PTA. Patient has not felt nauseous or had any vomiting today.   Patient reported appetite over the past two days had been very decreased due to vomiting and a fever that began 2/20. Patient vomited yesterday am (2/21) and ate a piece of toast and drank gingerale later in the afternoon. Last night, patient ordered soup once she was admitted but stated it had no taste so did not eat it. Patient ate a few crackers for dinner.  Patient reported appetite prior to 2/20 was good and consisted of 3 meals per day. 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 Per patient, breakfast today was her first meal since admission and she ate eggs, bacon and toast. Per chart, patient consumed 100% of breakfast.  Patient reported no problems with chewing or swallowing. She also reported an intentional 25 lb weight lost over the last 10 months by eating healthier.   Patient reported she has had swelling in her legs since yesterday. Patient is unsure what is causing this leg.  Nutrition focused physical exam found no signs of muscle or fat wasting and moderate edema present in lower extremities.  Meds Reviewed: Senokot,  Lactated ringers infusion  Labs Reviewed: Glucose 103, Calcium 8.8  Diet Order:  Diet regular Room service appropriate? Yes; Fluid consistency: Thin  Skin:  Reviewed, no issues  Last BM:  2/21  Height:   Ht Readings from Last 1 Encounters:  08/05/16 5\' 1"  (1.549 m)    Weight:   Wt Readings from Last 1 Encounters:  08/05/16 173 lb (78.5 kg)    Ideal Body Weight:  47.7 kg  BMI:  Body mass index is 32.69 kg/m.  Estimated Nutritional Needs:   Kcal:  1550-1800 kcal   Protein:  57-67 grams (1-1.2 g/kg IBW)  Fluid:  1.4-1.6 L/day  EDUCATION NEEDS:   No education needs identified at this time  Juliann Pulse M.S. Nutrition Dietetic Intern

## 2016-08-07 ENCOUNTER — Other Ambulatory Visit: Payer: Self-pay

## 2016-08-07 ENCOUNTER — Telehealth: Payer: Self-pay | Admitting: Obstetrics and Gynecology

## 2016-08-07 LAB — URINE CULTURE: Culture: NO GROWTH

## 2016-08-07 LAB — CBC WITH DIFFERENTIAL/PLATELET
Basophils Absolute: 0 10*3/uL (ref 0–0.1)
Basophils Relative: 0 %
Eosinophils Absolute: 0.1 10*3/uL (ref 0–0.7)
Eosinophils Relative: 1 %
HCT: 31.3 % — ABNORMAL LOW (ref 35.0–47.0)
Hemoglobin: 11 g/dL — ABNORMAL LOW (ref 12.0–16.0)
Lymphocytes Relative: 9 %
Lymphs Abs: 0.9 10*3/uL — ABNORMAL LOW (ref 1.0–3.6)
MCH: 30.8 pg (ref 26.0–34.0)
MCHC: 35.1 g/dL (ref 32.0–36.0)
MCV: 87.7 fL (ref 80.0–100.0)
Monocytes Absolute: 0.6 10*3/uL (ref 0.2–0.9)
Monocytes Relative: 6 %
Neutro Abs: 8.6 10*3/uL — ABNORMAL HIGH (ref 1.4–6.5)
Neutrophils Relative %: 84 %
Platelets: 181 10*3/uL (ref 150–440)
RBC: 3.57 MIL/uL — ABNORMAL LOW (ref 3.80–5.20)
RDW: 13.8 % (ref 11.5–14.5)
WBC: 10.3 10*3/uL (ref 3.6–11.0)

## 2016-08-07 LAB — TROPONIN I
Troponin I: 0.1 ng/mL (ref <0.03)
Troponin I: 0.1 ng/mL (ref <0.03)

## 2016-08-07 LAB — BASIC METABOLIC PANEL
Anion gap: 6 (ref 5–15)
BUN: 11 mg/dL (ref 6–20)
CO2: 26 mmol/L (ref 22–32)
Calcium: 8.2 mg/dL — ABNORMAL LOW (ref 8.9–10.3)
Chloride: 109 mmol/L (ref 101–111)
Creatinine, Ser: 0.86 mg/dL (ref 0.44–1.00)
GFR calc Af Amer: >60 mL/min (ref >60)
GFR calc non Af Amer: >60 mL/min (ref >60)
Glucose, Bld: 104 mg/dL — ABNORMAL HIGH (ref 65–99)
Potassium: 3 mmol/L — ABNORMAL LOW (ref 3.5–5.1)
Sodium: 141 mmol/L (ref 135–145)

## 2016-08-07 MED ORDER — AMOXICILLIN-POT CLAVULANATE 875-125 MG PO TABS: 1.0000 | ORAL_TABLET | Freq: Two times a day (BID) | ORAL | 0 refills | Status: DC

## 2016-08-07 NOTE — Discharge Instructions (Signed)
Discharge instructions:  Call office if you have any of the following: fever >101 F, chills, excessive vaginal bleeding, incision drainage or problems, leg pain or redness, or any other concerns.   Activity: Do not lift > 10 lbs for 8 weeks.  No intercourse or tampons for 8 weeks.  No driving for 1-2 weeks.     Please don't limit yourself in terms of routine activity.  You will be able to do most things, although they may take longer to do or be a little painful.  You can do it!

## 2016-08-07 NOTE — Discharge Summary (Signed)
Gynecology Physician Postoperative Discharge Summary  Patient ID: Jaime Hall MRN: [id number] DOB/AGE: 11/10/1950 66 y.o.  Admit Date: 08/05/2016 Discharge Date: 08/09/2016  Admission Diagnoses: post-op fever, leukocytosis Discharge Diagnoses: Pelvic Abscess  Procedures: antibiotics and IV fluids, CT scan  CBC Latest Ref Rng & Units 08/07/2016 08/06/2016 08/05/2016  WBC 3.6 - 11.0 K/uL 10.3 17.4(H) 24.1(H)  Hemoglobin 12.0 - 16.0 g/dL 11.0(L) 11.7(L) 12.6  Hematocrit 35.0 - 47.0 % 31.3(L) 34.0(L) 36.8  Platelets 150 - 440 K/uL 181 157 178    Hospital Course:  JAMMY STLOUIS is a 66 y.o. 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  Staging is as of yet incomplete, but appears to be 1B.  She presented with at-home fevers of 101, 102 and was found to have a 3cm pelvic abscess in her LLQ.  There was extensive adhesive disease between the bowel and uterus/left adnexa.  CT did not show extravasation of contrast into the area, although the study was not optimal for that evaluation.  She met SIRS criteria with leukocytosis and febrile illness, thus was admitted for IV antibiotics and supportive therapy.  She was given IV fluids and pain control as needed.  She did have a LLQ pain on admission, which resolved prior to discharge.  She remained afebrile for 48 hours and was discharged home on Augmentin x 14 days.  Blood cultures and urine cultures were not fully reported prior to discharge but were negative/no growth up until that point.   Discharge Exam: Blood pressure (!) 153/64, pulse 94, temperature 98.5 F (36.9 C), temperature source Oral, resp. rate 17, height '5\' 1"'  (1.549 m), weight 78.5 kg (173 lb), SpO2 97 %. General appearance: alert and no distress  Resp: clear to auscultation bilaterally, normal respiratory effort Cardio: regular rate and rhythm  GI: soft, non-tender; bowel sounds normal; no masses, no organomegaly.  Incisions:  C/D/I, no erythema, no drainage noted Pelvic: scant blood on pad  Extremities: extremities normal, atraumatic, no cyanosis or edema and Homans sign is negative, no sign of DVT  Discharged Condition: Stable  Disposition: 01-Home or Self Care  Discharge Instructions    Diet - low sodium heart healthy    Complete by:  As directed    Increase activity slowly    Complete by:  As directed      Allergies as of 08/07/2016      Reactions   Adhesive [tape] Other (See Comments)   Burns the skin   Z-pak [azithromycin] Itching   Antifungal [miconazole Nitrate] Rash   Sulfa Antibiotics Nausea And Vomiting, Rash   N/T      Medication List    TAKE these medications   acetaminophen 500 MG tablet Commonly known as:  TYLENOL Take 1,000 mg by mouth every 6 (six) hours as needed.   amoxicillin-clavulanate 875-125 MG tablet Commonly known as:  AUGMENTIN Take 1 tablet by mouth 2 (two) times daily.   aspirin EC 81 MG tablet Take 81 mg by mouth at bedtime.   atorvastatin 10 MG tablet Commonly known as:  LIPITOR TAKE ONE TABLET BY MOUTH ONCE DAILY What changed:  how much to take  how to take this  when to take this  additional instructions   b complex vitamins tablet Take 1 tablet by mouth at bedtime.   diazepam 5 MG tablet Commonly known as:  VALIUM Take 1 tablet (5 mg total) by mouth every 12 (twelve) hours as needed for anxiety.  What changed:  how much to take   esomeprazole 20 MG capsule Commonly known as:  NEXIUM Take 20 mg by mouth daily before breakfast.   ibuprofen 200 MG tablet Commonly known as:  ADVIL,MOTRIN Take 200 mg by mouth every 6 (six) hours as needed.   metoprolol tartrate 25 MG tablet Commonly known as:  LOPRESSOR TAKE ONE TABLET BY MOUTH TWICE DAILY   naproxen sodium 220 MG tablet Commonly known as:  ANAPROX Take 440 mg by mouth 2 (two) times daily as needed (for pain/headache.).   nitroGLYCERIN 0.4 MG SL tablet Commonly known as:   NITROSTAT Place 1 tablet (0.4 mg total) under the tongue every 5 (five) minutes as needed for chest pain.   oxyCODONE 5 MG immediate release tablet Commonly known as:  ROXICODONE Take 1 tablet (5 mg total) by mouth every 4 (four) hours as needed for severe pain.   Turmeric 500 MG Caps Take 500 mg by mouth 2 (two) times daily.   vitamin B-12 1000 MCG tablet Commonly known as:  CYANOCOBALAMIN Take 1,000 mcg by mouth daily.   Vitamin D3 2000 units Tabs Take 2,000 Units by mouth 3 (three) times a week.        Signed:  [place] Attending [facility]

## 2016-08-07 NOTE — Telephone Encounter (Signed)
I contacted Jaime Hall regarding her pathology results noted below. She was recently admitted and diagnosed with a pelvic abscess. She is doing very well on antibiotics with improved symptoms. She is scheduled for discharge today. I have recommended consideration of adjuvant chemotherapy and vaginal cuff brachytherapy versus whole pelvic radiation once she has improved. We will arrange for Medical and Radiation Oncology consults. She agrees with this plan. She is scheduled for follow up on 09/02/2016.  [name], MD   SURGICAL PATHOLOGY Surgical Pathology  CASE: [id number]  PATIENT: Jaime Hall  Surgical Pathology Report      SPECIMEN SUBMITTED:  A. Sentinel node, right mid obturator  B. Sentinel node, right external iliac  C. Sentinel node, left external iliac  D. Uterus with cervix, biilateral tubes and ovaries   CLINICAL HISTORY:  None provided   PRE-OPERATIVE DIAGNOSIS:  Uterine cancer   POST-OPERATIVE DIAGNOSIS:  Same as pre-op      DIAGNOSIS:  A. SENTINEL LYMPH NODE, RIGHT MID OBTURATOR; EXCISION:  - NO TUMOR SEEN IN ONE LYMPH NODE (0/1).   B. SENTINEL LYMPH NODE, RIGHT EXTERNAL ILIAC; EXCISION:  - NO TUMOR SEEN IN ONE LYMPH NODE (0/1).   C. SENTINEL LYMPH NODE, LEFT EXTERNAL ILIAC; EXCISION:  - NO TUMOR SEEN IN ONE LYMPH NODE (0/1).   D. 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  - LYMPHOVASCULAR INVASION PRESENT.  - NO TUMOR SEEN IN BILATERAL OVARIES AND FALLOPIAN TUBES.  - SEE SUMMARY BELOW.   Surgical Pathology Cancer Case Summary    ENDOMETRIUM:  Procedure: Hysterectomy with bilateral salpingo-oophorectomy  Histologic Type: Serous carcinoma  Myometrial Invasion: Present       Depth of invasion (millimeters): 13 mm  Myometrial thickness (millimeters): 14 mm       Percentage of myometrial invasion: 93%  Uterine  Serosa Involvement: Not identified  Cervical Stromal Involvement: Not identified  Other Tissue/Organ Involvement: Bilateral tubes and ovaries not involved  Peritoneal/ Ascitic Fluid: Negative for malignancy (separate case  [id number])  Margins: N/A  Lymphovascular Invasion: Present  Regional Lymph Nodes: Pelvic Node Examination       # of Pelvic Nodes with Macrometastasis: 0       # of Pelvic Nodes with Micrometastasis: 0       Total # of Pelvic Nodes Examined (sentinel and non-sentinel):  3  # of Pelvic Sentinel Nodes Examined: 3  Laterality of Pelvic Node(s) Examined: Bilateral external iliacs, right  mid obturator  Pathologic Stage Classification (pTNM, AJCC 8th Edition): pT1b pN0(sn) /  FIGO IB  TNM Descriptors: N/A   BIOMARKER REPORTING TEMPLATE:  p53 Expression: 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 Microsatellite  instability, ER, and PR IHC is obtained and results will be reported in  an addendum.      DIAGNOSIS:  A. PELVIC WASHINGS:  - NEGATIVE FOR MALIGNANCY.

## 2016-08-07 NOTE — Care Management Important Message (Signed)
Important Message  Patient Details  Name: Jaime Hall MRN: [id number] Date of Birth: Sep 13, 1950   Medicare Important Message Given:  Yes    Jolly Mango, RN 08/07/2016, 11:37 AM

## 2016-08-07 NOTE — Progress Notes (Signed)
Patient is alert and oriented. No complaints of pain at this time. Vital signs stable. Discharge instructions gone over with patient at this time. AVS and hard scripts given to patient. Patient verbalized understanding of all discharge instructions. IV discontinued. Family arrived to transport patient home.

## 2016-08-09 LAB — BLOOD CULTURE ID PANEL (REFLEXED)

## 2016-08-09 NOTE — Progress Notes (Signed)
PHARMACY - PHYSICIAN COMMUNICATION CRITICAL VALUE ALERT - BLOOD CULTURE IDENTIFICATION (BCID)    Blood Culture (routine x 2) [id number] Collected: 08/05/16 1826  Order Status: Completed Specimen: Blood from BLOOD Updated: 08/09/16 1123   Specimen Description BLOOD RIGHT AC   Special Requests BOTTLES DRAWN AEROBIC AND ANAEROBIC BCAV   Culture Setup Time --   GRAM NEGATIVE RODS  ANAEROBIC BOTTLE ONLY  CRITICAL RESULT CALLED TO, READ BACK BY AND VERIFIED WITH:  Byan Poplaski AT 1115 08/09/16 SDR  Organism ID to follow    Culture GRAM NEGATIVE RODS   Report Status PENDING    No results found for this or any previous visit.  Assessment: 66 yo female recent discharged with diagnosis of Pelvic Abscess. Lab now reporting positive blood cultures, GNR in 1 of 4 bottles   Name of physician (or Provider) Contacted: Dr. Leonides Schanz   Changes to prescribed antibiotics required: Patient received two days of Zosyn as inpatient and was discharged on PO Augmentin. Dr. Leonides Schanz will follow up with patient and repeat blood cultures. No change in therapy at this time.   Pernell Dupre, PharmD, BCPS Clinical Pharmacist 08/09/2016 11:33 AM

## 2016-08-11 LAB — CULTURE, BLOOD (ROUTINE X 2)

## 2016-08-13 ENCOUNTER — Telehealth: Payer: Self-pay | Admitting: Cardiovascular Disease

## 2016-08-13 ENCOUNTER — Ambulatory Visit
Admission: RE | Admit: 2016-08-13 | Discharge: 2016-08-13 | Disposition: A | Discharge status: Home / Self Care | Payer: PPO | Source: Ambulatory Visit | Attending: Obstetrics & Gynecology | Admitting: Obstetrics & Gynecology

## 2016-08-13 ENCOUNTER — Other Ambulatory Visit: Payer: Self-pay | Admitting: Obstetrics & Gynecology

## 2016-08-13 DIAGNOSIS — N828 Other female genital tract fistulae: Secondary | ICD-10-CM | POA: Insufficient documentation

## 2016-08-13 DIAGNOSIS — N760 Acute vaginitis: Secondary | ICD-10-CM | POA: Insufficient documentation

## 2016-08-13 DIAGNOSIS — E785 Hyperlipidemia, unspecified: Secondary | ICD-10-CM | POA: Insufficient documentation

## 2016-08-13 DIAGNOSIS — C541 Malignant neoplasm of endometrium: Secondary | ICD-10-CM

## 2016-08-13 DIAGNOSIS — I700 Atherosclerosis of aorta: Secondary | ICD-10-CM | POA: Diagnosis not present

## 2016-08-13 DIAGNOSIS — Z860100 Personal history of colon polyps, unspecified: Secondary | ICD-10-CM | POA: Insufficient documentation

## 2016-08-13 DIAGNOSIS — N736 Female pelvic peritoneal adhesions (postinfective): Secondary | ICD-10-CM

## 2016-08-13 DIAGNOSIS — I714 Abdominal aortic aneurysm, without rupture: Secondary | ICD-10-CM | POA: Diagnosis not present

## 2016-08-13 DIAGNOSIS — D125 Benign neoplasm of sigmoid colon: Secondary | ICD-10-CM | POA: Insufficient documentation

## 2016-08-13 MED ORDER — IOPAMIDOL (ISOVUE-300) INJECTION 61%
125.0000 mL | Freq: Once | INTRAVENOUS | Status: AC | PRN
  Administered 2016-08-13: 16:00:00 via INTRAVENOUS

## 2016-08-13 NOTE — Telephone Encounter (Signed)
S/w patient. She is aware of appt tomorrow at 1130.

## 2016-08-13 NOTE — Progress Notes (Signed)
08/14/2016 11:48 AM   Jaime Hall [birth year] [id number]  Referring provider: Maceo Pro, MD [place] [address], [place] [id number]  Chief Complaint  Patient presents with  . 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 Vikki Ports Ward and Dr. [name] on 07/29/2016. Intraoperatively, severe pelvic abdominal adhesions were noted and she underwent extensive adhesion lysis.  Her postop course was complicated by readmission with fever to 102 found to have a 3 cm pelvic abscess in her left lower quadrant. She was treated with IV antibiotics transitioned to 14 days of Augmentin. Urine and blood cultures were negative.  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 No evidence of nodal disease. PT1bN0.  She was referred for further evaluation secondary to vaginal leakage. Per Dr. Leonides Schanz, she underwent a double tied tests with oral Pyridium as well as backfilling the bladder with blue. 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  Fluid in her vaginal vault was also appreciated. This was clear straw colored, non-odorous.  This fluid was unable to be sent for fluid creatinine.  She reports that she first noted vaginal leakage in the first few days after surgery. This is gone progressively worse. A Foley catheter was placed 2 days ago and there is been some minimal improvement. She is been wearing tampons. She states the fluid is clear to light yellow in color. It did change to [facility] when she started taking oral Pyridium. It is distressing to her.  She denies any bowel symptoms including no diarrhea, loose stool, fecal incontinence, or constipation.  She does have multiple medical  comorbidities including a history of coronary artery disease. She did have a mild troponin bump likely due to the demand ischemia during her readmission. Cardiac follow-up has been arranged for later today.   She is still on Augmentin.      PMH: Past Medical History:  Diagnosis Date  . Anxiety   . Cervicalgia   . Coronary artery disease 02/2012   Abnormal stress test with anterior wall ischemia. Cardiac catheterization showed a 95% stenosis in the proximal LAD bifurcating with a 90% stenosis in first diagonal. Ejection fraction was 45% with anterior wall hypokinesis. She underwent balloon angioplasty to ostial first diagonal and drug-eluting stent placement to proximal LAD with a 3.0 x 15 mm Xience EX drug-eluting stent  . Endometrial cancer ([place])   . Essential hypertension, benign   . Fibrocystic breast disease   . GERD (gastroesophageal reflux disease)   . Gestational hypertension   . Heart disease   . Heart murmur   . History of anemia   . History of blood transfusion   . Hodgkin's lymphoma ([place]) 2011   s/p radiation and chemo therapy  . Osteoarthritis   . Polycystic ovarian disease     Surgical History: Past Surgical History:  Procedure Laterality Date  . ABDOMINAL HYSTERECTOMY    . bladder sling    . CARDIAC CATHETERIZATION  02/2012   [facility] 1 stent place  . CERVICAL POLYPECTOMY    . CHOLECYSTECTOMY  1982  . COLONOSCOPY WITH PROPOFOL N/A 02/05/2015   Procedure: COLONOSCOPY WITH PROPOFOL;  Surgeon: Lucilla Lame, MD;  Location: [facility] ENDOSCOPY;  Service: Endoscopy;  Laterality: N/A;  . CORONARY ANGIOPLASTY  02/2012  left/right s/p balloon  . heart stent'  2013  . LYMPH NODE BIOPSY  2011   diagnosis of hodgkins lymphoma  . PELVIC LYMPH NODE DISSECTION N/A 07/29/2016   Procedure: PELVIC/AORTIC LYMPH NODE SAMPLING;  Surgeon: [name], MD;  Location: [facility] ORS;  Service: Gynecology;  Laterality: N/A;  . ROBOTIC ASSISTED TOTAL HYSTERECTOMY WITH BILATERAL SALPINGO  OOPHERECTOMY N/A 07/29/2016   Procedure: ROBOTIC ASSISTED TOTAL HYSTERECTOMY WITH BILATERAL SALPINGO OOPHORECTOMY;  Surgeon: [name], MD;  Location: [facility] ORS;  Service: Gynecology;  Laterality: N/A;  . SENTINEL NODE BIOPSY N/A 07/29/2016   Procedure: SENTINEL NODE BIOPSY;  Surgeon: [name], MD;  Location: [facility] ORS;  Service: Gynecology;  Laterality: N/A;  . transobturator sling N/A 2009   [place]    Home Medications:  Allergies as of 08/14/2016      Reactions   Adhesive [tape] Other (See Comments)   Burns the skin   Z-pak [azithromycin] Itching   Antifungal [miconazole Nitrate] Rash   Sulfa Antibiotics Nausea And Vomiting, Rash   N/T      Medication List       Accurate as of 08/14/16 11:48 AM. Always use your most recent med list.          acetaminophen 500 MG tablet Commonly known as:  TYLENOL Take 1,000 mg by mouth every 6 (six) hours as needed.   amoxicillin-clavulanate 875-125 MG tablet Commonly known as:  AUGMENTIN Take 1 tablet by mouth 2 (two) times daily.   aspirin EC 81 MG tablet Take 81 mg by mouth at bedtime.   atorvastatin 10 MG tablet Commonly known as:  LIPITOR TAKE ONE TABLET BY MOUTH ONCE DAILY   b complex vitamins tablet Take 1 tablet by mouth at bedtime.   diazepam 5 MG tablet Commonly known as:  VALIUM Take 1 tablet (5 mg total) by mouth every 12 (twelve) hours as needed for anxiety.   esomeprazole 20 MG capsule Commonly known as:  NEXIUM Take 20 mg by mouth daily before breakfast.   ibuprofen 200 MG tablet Commonly known as:  ADVIL,MOTRIN Take 200 mg by mouth every 6 (six) hours as needed.   metoprolol tartrate 25 MG tablet Commonly known as:  LOPRESSOR TAKE ONE TABLET BY MOUTH TWICE DAILY   naproxen sodium 220 MG tablet Commonly known as:  ANAPROX Take 440 mg by mouth 2 (two) times daily as needed (for pain/headache.).   nitroGLYCERIN 0.4 MG SL tablet Commonly known as:  NITROSTAT Place 1 tablet (0.4 mg  total) under the tongue every 5 (five) minutes as needed for chest pain.   oxyCODONE 5 MG immediate release tablet Commonly known as:  ROXICODONE Take 1 tablet (5 mg total) by mouth every 4 (four) hours as needed for severe pain.   Turmeric 500 MG Caps Take 500 mg by mouth 2 (two) times daily.   vitamin B-12 1000 MCG tablet Commonly known as:  CYANOCOBALAMIN Take 1,000 mcg by mouth daily.   Vitamin D3 2000 units Tabs Take 2,000 Units by mouth 3 (three) times a week.       Allergies:  Allergies  Allergen Reactions  . Adhesive [Tape] Other (See Comments)    Burns the skin  . Z-Pak [Azithromycin] Itching  . Antifungal [Miconazole Nitrate] Rash  . Sulfa Antibiotics Nausea And Vomiting and Rash    N/T    Family History: Family History  Problem Relation Age of Onset  . ALS Father   . Polymyositis Father   . Diabetes Brother   .  Cancer Maternal Aunt     breast  . Breast cancer Maternal Aunt     30's  . Stroke Maternal Grandmother   . Cancer Maternal Grandfather     prostate  . Stroke Maternal Grandfather     Social History:  reports that she quit smoking about 14 years ago. Her smoking use included Cigarettes. She has a 30.00 pack-year smoking history. She has never used smokeless tobacco. She reports that she drinks alcohol. She reports that she does not use drugs.  ROS: UROLOGY Frequent Urination?: No Hard to postpone urination?: No Burning/pain with urination?: No Get up at night to urinate?: No Leakage of urine?: Yes Urine stream starts and stops?: No Trouble starting stream?: No Do you have to strain to urinate?: No Blood in urine?: No Urinary tract infection?: No Sexually transmitted disease?: No Injury to kidneys or bladder?: No Painful intercourse?: No Weak stream?: No Currently pregnant?: No Vaginal bleeding?: No Last menstrual period?: n  Gastrointestinal Nausea?: No Vomiting?: No Indigestion/heartburn?: No Diarrhea?: No Constipation?:  No  Constitutional Fever: No Night sweats?: No Weight loss?: No Fatigue?: No  Skin Skin rash/lesions?: No Itching?: No  Eyes Blurred vision?: No Double vision?: No  Ears/Nose/Throat Sore throat?: No Sinus problems?: No  Hematologic/Lymphatic Swollen glands?: No Easy bruising?: No  Cardiovascular Leg swelling?: No Chest pain?: No  Respiratory Cough?: No Shortness of breath?: No  Endocrine Excessive thirst?: No  Musculoskeletal Back pain?: No Joint pain?: No  Neurological Headaches?: No Dizziness?: No  Psychologic Depression?: No Anxiety?: No  Physical Exam: BP 121/73   Pulse 92   Ht 5' 1.5" (1.562 m)   Wt 174 lb 12.8 oz (79.3 kg)   BMI 32.49 kg/m   Constitutional:  Alert and oriented, No acute distress. HEENT: [place] AT, moist mucus membranes.  Trachea midline, no masses. Cardiovascular: No clubbing, cyanosis, or edema. Respiratory: Normal respiratory effort, no increased work of breathing. GI: Abdomen is soft, nontender, nondistended, no abdominal masses GU: No CVA tenderness.  Skin: No rashes, bruises or suspicious lesions. Neurologic: Grossly intact, no focal deficits, moving all 4 extremities. Psychiatric: Normal mood and affect.  Laboratory Data: Lab Results  Component Value Date   WBC 10.3 08/07/2016   HGB 11.0 (L) 08/07/2016   HCT 31.3 (L) 08/07/2016   MCV 87.7 08/07/2016   PLT 181 08/07/2016    Lab Results  Component Value Date   CREATININE 0.86 08/07/2016     Lab Results  Component Value Date   HGBA1C 5.8 05/26/2016    Urinalysis    Component Value Date/Time   COLORURINE YELLOW (A) 08/05/2016 1950   APPEARANCEUR CLEAR (A) 08/05/2016 1950   LABSPEC 1.018 08/05/2016 1950   PHURINE 6.0 08/05/2016 1950   GLUCOSEU NEGATIVE 08/05/2016 1950   GLUCOSEU NEGATIVE 07/07/2016 1418   HGBUR MODERATE (A) 08/05/2016 1950   BILIRUBINUR NEGATIVE 08/05/2016 1950   BILIRUBINUR negative 07/07/2016 1429   KETONESUR 5 (A) 08/05/2016 1950    PROTEINUR 100 (A) 08/05/2016 1950   UROBILINOGEN 0.2 07/07/2016 1429   UROBILINOGEN 0.2 07/07/2016 1418   NITRITE NEGATIVE 08/05/2016 1950   LEUKOCYTESUR MODERATE (A) 08/05/2016 1950    Pertinent Imaging: CLINICAL DATA:  Status post robotic TAHBSO 07/29/2016 for endometrial cancer (FIGO 1B), complicated by preadmission for pelvic abscess at the hysterectomy margin 08/05/2016, treated with antibiotic therapy. Patient reportedly is preoperative for repair of a vaginal fistula.  EXAM: CT ABDOMEN AND PELVIS WITHOUT AND WITH CONTRAST  TECHNIQUE: Multidetector CT imaging of the abdomen and pelvis  was performed following the standard protocol before and following the bolus administration of intravenous contrast.  CONTRAST:  125 cc ISOVUE-300 IOPAMIDOL (ISOVUE-300) INJECTION 61%  COMPARISON:  08/05/2016 CT abdomen/ pelvis.  FINDINGS: Lower chest: No significant pulmonary nodules or acute consolidative airspace disease.  Hepatobiliary: Normal liver with no liver mass. Cholecystectomy. Bile ducts are stable and within expected post cholecystectomy limits with common bile duct diameter 8 mm and no radiopaque choledocholithiasis.  Pancreas: Normal, with no mass or duct dilation.  Spleen: Normal size. No mass.  Adrenals/Urinary Tract: Normal adrenals. No renal stones. No hydronephrosis. Simple 2.3 cm lower right renal cyst. Additional subcentimeter hypodense renal cortical lesions in both kidneys are too small to characterize and appear unchanged, and require no further follow-up. No new renal lesions. Normal caliber ureters, with no ureteral stones. No contrast extravasation from the ureters, noting non opacification of the lower lumbar and pelvic segments of the right ureter. No filling defects in the renal collecting systems or opacified portions of the ureters. Bladder is nearly collapsed by indwelling Foley catheter. No bladder wall thickening. No discrete bladder  mass. No bladder stones. No evidence of contrast extravasation from the bladder, which is incompletely opacified and nearly collapsed.  Stomach/Bowel: Grossly normal stomach. Normal caliber small bowel with no small bowel wall thickening. Normal appendix. Moderate sigmoid diverticulosis. No definite large bowel wall thickening. Mild stool throughout the large bowel.  Vascular/Lymphatic: Atherosclerotic nonaneurysmal abdominal aorta. Patent portal, splenic, hepatic and renal veins. No pathologically enlarged lymph nodes in the abdomen or pelvis.  Reproductive: Status posthysterectomy. There is a 3.3 x 1.7 cm gas containing collection at the vaginal cuff (series 7/image 74), previously 3.3 x 1.7 cm using similar measurement technique on 08/05/2016 CT study, not appreciably changed in size, which demonstrates an increasingly thick hyperenhancing wall with superior extension along the left pelvic sidewall, abutting the posterior margin of the mid sigmoid colon (series 7/image 70).  Other: No ascites. No pneumoperitoneum. No additional focal fluid collections. Fat stranding and ill-defined fluid in the subcutaneous periumbilical ventral abdominal wall at the port site, slightly decreased, without a measurable drainable superficial fluid collection.  Musculoskeletal: No aggressive appearing focal osseous lesions. Moderate thoracolumbar spondylosis.  IMPRESSION: 1. Stable size of an abscess at the vaginal cuff with superior extension along the left pelvic sidewall to the posterior margin of the mid sigmoid colon. 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 2. No hydronephrosis. Normal caliber ureters. No evidence of contrast extravasation from the ureters or bladder, with limitations as described. 3. No evidence of metastatic disease in the abdomen or pelvis. 4. Aortic atherosclerosis.   Electronically Signed   By: Ilona Sorrel M.D.   On: 08/13/2016 16:46  CT  urogram from yesterday was reviewed personally today. Incompletely delayed imaging from the right kidney but otherwise no obvious contrast extravasation or evidence of ureterovaginal fistula from this particular CT scan.  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  No obvious fistula or leak appreciated on CT urogram, although there is no complete delayed imaging to assess the right-sided ureter.  I have recommended today pursuing further diagnosis/intervention in the operating room on Monday. This would include exam under anesthesia, cystoscopy, cystogram, bilateral retrograde pyelogram, and possible ureteral stent placement if a ureterovaginal fistula is identified. The risk and benefits of this in detail.  We discussed that with  the stent, this should help with her vaginal leakage if this is the diagnosis and in a few incidences, prolonged stent alone and has a loud fistula to heal. Otherwise, she will need delayed repair with likely ureteral reimplant/fistula closure.  Cardiac clearance today pending for procedure.   1. Malignant neoplasm of uterus, unspecified site [facility]) Will discuss further with Dr. Cristopher Estimable- plan for chemo?  2. 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 Pelvic abscess in female Contineu course of antibiotics Will need repeat imaging to assess for resolution prior to any definitive management of her initial outside of stenting   Hollice Espy, MD  [facility] [address], [place], [place] [address] [phone]  Case was discussed with Dr. Leonides Schanz today extensively.  I spent 45 min with this patient of which greater  than 50% was spent in counseling and coordination of care with the patient.

## 2016-08-13 NOTE — Telephone Encounter (Signed)
Returned call to Jaime Hall at [facility]. Need clearance and patient to hold Aspirin for fistula repair s/p hysterectomy for Monday, 08/17/16.  Patient had hysterectomy on 07/29/16. Admitted on 08/05/16, with pelvic abscess and had abnormal EKG and elevated troponin's (See EPIC). It is noted in Dr Guido Sander progress note from 08/06/16 at 10:36: "2. History of cardiac disease:               Troponin was 0.06 on admission.  I repeated it this morning and was 0.10.  I called her cardiology group and discussed with their on-call physician.  He reassured me that this was a normal and ok number that is not representative of cardiac compromise in Jaime Hall.  I have ordered another to be drawn in 6hrs and he will follow.  If worsening will evaluate patient."  Will route to Dr [name] for clearance.

## 2016-08-13 NOTE — Telephone Encounter (Signed)
We should see her in clinic. Aspirin can not be held given previous drug eluting stent.

## 2016-08-13 NOTE — Telephone Encounter (Signed)
Patient is scheduled for surgery on Monday fistula repair s/p hysterectomy.  Please call [place] [facility] do discuss asap clearance.  [phone]

## 2016-08-14 ENCOUNTER — Encounter: Payer: Self-pay | Admitting: Nurse Practitioner

## 2016-08-14 ENCOUNTER — Ambulatory Visit (INDEPENDENT_AMBULATORY_CARE_PROVIDER_SITE_OTHER): Payer: PPO | Admitting: Nurse Practitioner

## 2016-08-14 ENCOUNTER — Other Ambulatory Visit: Payer: Self-pay | Admitting: Radiology

## 2016-08-14 ENCOUNTER — Encounter: Payer: Self-pay | Admitting: Urology

## 2016-08-14 ENCOUNTER — Ambulatory Visit (INDEPENDENT_AMBULATORY_CARE_PROVIDER_SITE_OTHER): Payer: PPO | Admitting: Urology

## 2016-08-14 VITALS — BP 122/70 | HR 68 | Ht 61.0 in | Wt 175.0 lb

## 2016-08-14 VITALS — BP 121/73 | HR 92 | Ht 61.5 in | Wt 174.8 lb

## 2016-08-14 DIAGNOSIS — C55 Malignant neoplasm of uterus, part unspecified: Secondary | ICD-10-CM | POA: Diagnosis not present

## 2016-08-14 DIAGNOSIS — N739 Female pelvic inflammatory disease, unspecified: Secondary | ICD-10-CM

## 2016-08-14 DIAGNOSIS — I10 Essential (primary) hypertension: Secondary | ICD-10-CM | POA: Diagnosis not present

## 2016-08-14 DIAGNOSIS — N3945 Continuous leakage: Secondary | ICD-10-CM

## 2016-08-14 DIAGNOSIS — E7800 Pure hypercholesterolemia, unspecified: Secondary | ICD-10-CM

## 2016-08-14 DIAGNOSIS — Z01810 Encounter for preprocedural cardiovascular examination: Secondary | ICD-10-CM

## 2016-08-14 DIAGNOSIS — R32 Unspecified urinary incontinence: Secondary | ICD-10-CM

## 2016-08-14 DIAGNOSIS — I2510 Atherosclerotic heart disease of native coronary artery without angina pectoris: Secondary | ICD-10-CM

## 2016-08-14 NOTE — Patient Instructions (Signed)
Medication Instructions:  Please continue your current medications  Labwork: None  Testing/Procedures: None  Follow-Up: Your physician wants you to follow-up in: 6 months w/ Dr. Arida.  You will receive a reminder letter in the mail two months in advance.  If you don't receive a letter, please call our office to schedule the follow-up appointment.  If you need a refill on your cardiac medications before your next appointment, please call your pharmacy.   

## 2016-08-14 NOTE — Progress Notes (Signed)
Office Visit    Patient Name: Jaime Hall Date of Encounter: 08/14/2016  Primary Care Provider:  [name], MD Primary Cardiologist:  Jerilynn Mages. [name], MD   Chief Complaint    66 y/o ? with a h/o CAD s/p LAD and Diagonal intervention in 2013, who presents for preop eval.  Past Medical History    Past Medical History:  Diagnosis Date  . Anxiety   . Cervicalgia   . Coronary artery disease    a. 02/2012 Stress echo: severe anterior wall ischemia;  b. 02/2012 Cath/PCI: LAD 95p (3.0 x 15 Xience EX DES), D1 90ost (PTCA - bifurcational dzs), EF 45% with anterior HK;  b. 02/2013 Ex MV: fixed anterior defect w/ minor reversibility, nl EF-->Med Rx.  . Endometrial cancer ([place])    a. 07/2016 s/p robotic hysterectomy, BSO w/ washings, sentinel node inj, mapping, bx, adhesiolysis.  . Essential hypertension, benign   . Fibrocystic breast disease   . GERD (gastroesophageal reflux disease)   . Gestational hypertension   . Heart murmur   . History of anemia   . History of blood transfusion   . Hodgkin's lymphoma ([place]) 2011   a. s/p radiation and chemo therapy  . Osteoarthritis   . Polycystic ovarian disease    Past Surgical History:  Procedure Laterality Date  . ABDOMINAL HYSTERECTOMY    . bladder sling    . CARDIAC CATHETERIZATION  02/2012   [facility] 1 stent place  . CERVICAL POLYPECTOMY    . CHOLECYSTECTOMY  1982  . COLONOSCOPY WITH PROPOFOL N/A 02/05/2015   Procedure: COLONOSCOPY WITH PROPOFOL;  Surgeon: Lucilla Lame, MD;  Location: [facility] ENDOSCOPY;  Service: Endoscopy;  Laterality: N/A;  . CORONARY ANGIOPLASTY  02/2012   left/right s/p balloon  . heart stent'  2013  . LYMPH NODE BIOPSY  2011   diagnosis of hodgkins lymphoma  . PELVIC LYMPH NODE DISSECTION N/A 07/29/2016   Procedure: PELVIC/AORTIC LYMPH NODE SAMPLING;  Surgeon: [name], MD;  Location: [facility] ORS;  Service: Gynecology;  Laterality: N/A;  . ROBOTIC ASSISTED TOTAL HYSTERECTOMY WITH BILATERAL SALPINGO  OOPHERECTOMY N/A 07/29/2016   Procedure: ROBOTIC ASSISTED TOTAL HYSTERECTOMY WITH BILATERAL SALPINGO OOPHORECTOMY;  Surgeon: [name], MD;  Location: [facility] ORS;  Service: Gynecology;  Laterality: N/A;  . SENTINEL NODE BIOPSY N/A 07/29/2016   Procedure: SENTINEL NODE BIOPSY;  Surgeon: [name], MD;  Location: [facility] ORS;  Service: Gynecology;  Laterality: N/A;  . transobturator sling N/A 2009   [place]    Allergies  Allergies  Allergen Reactions  . Adhesive [Tape] Other (See Comments)    Burns the skin  . Z-Pak [Azithromycin] Itching  . Antifungal [Miconazole Nitrate] Rash  . Sulfa Antibiotics Nausea And Vomiting and Rash    N/T    History of Present Illness    66 y/o ? with the above complex PMH including CAD s/p PCI/DES of the LAD and PTCA of the ostial D1 in 2013 with subsequent low risk stress test in 02/2013.  Other hx includes HTN, HL, Hodgkin's lymphoma, and more recently endometrial cancer, for which she underwent hysterectomy and BSO on 2/14.  Post-op course was complicated by fever and abdominal discomfort, which resulted in readmission and finding of pelvic abscess in the LLQ. She was initially treated with IV abx and subsequently transitioned to oral augmentin.  In the setting of fever/SIRS, she had mild troponin elevation (0.06  0.10  0.10) in the absence of chest pain, dyspnea, or ECG changes.  Since discharge, she has done well from a cardiac standpoint.  She has been walking a few days/wk w/o chest pain, dyspnea, or changes in exercise tolerance.  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  She is scheduled for cystoscopy and cystogram on Monday 3/5 under general anesthesia.  Home Medications    Prior to Admission medications   Medication Sig Start Date End Date Taking? Authorizing Provider  acetaminophen (TYLENOL) 500 MG tablet Take 1,000 mg by  mouth every 6 (six) hours as needed.   Yes Historical Provider, MD  amoxicillin-clavulanate (AUGMENTIN) 875-125 MG tablet Take 1 tablet by mouth 2 (two) times daily. 08/07/16  Yes Chelsea C Ward, MD  aspirin EC 81 MG tablet Take 81 mg by mouth at bedtime.   Yes Historical Provider, MD  atorvastatin (LIPITOR) 10 MG tablet TAKE ONE TABLET BY MOUTH ONCE DAILY Patient taking differently: Take 10 mg by mouth at bedtime.  03/27/14  Yes Wellington Hampshire, MD  b complex vitamins tablet Take 1 tablet by mouth at bedtime.   Yes Historical Provider, MD  Cholecalciferol (VITAMIN D3) 2000 units TABS Take 2,000 Units by mouth 3 (three) times a week.   Yes Historical Provider, MD  diazepam (VALIUM) 5 MG tablet Take 1 tablet (5 mg total) by mouth every 12 (twelve) hours as needed for anxiety. 03/03/16  Yes [name], MD  esomeprazole (NEXIUM) 20 MG capsule Take 20 mg by mouth daily before breakfast.    Yes Historical Provider, MD  ibuprofen (ADVIL,MOTRIN) 200 MG tablet Take 200 mg by mouth every 6 (six) hours as needed.   Yes Historical Provider, MD  metoprolol tartrate (LOPRESSOR) 25 MG tablet TAKE ONE TABLET BY MOUTH TWICE DAILY 06/11/16  Yes Wellington Hampshire, MD  naproxen sodium (ANAPROX) 220 MG tablet Take 440 mg by mouth 2 (two) times daily as needed (for pain/headache.).   Yes Historical Provider, MD  nitroGLYCERIN (NITROSTAT) 0.4 MG SL tablet Place 1 tablet (0.4 mg total) under the tongue every 5 (five) minutes as needed for chest pain. 02/22/13  Yes Minna Merritts, MD  oxyCODONE (ROXICODONE) 5 MG immediate release tablet Take 1 tablet (5 mg total) by mouth every 4 (four) hours as needed for severe pain. 07/29/16  Yes Chelsea C Ward, MD  Turmeric 500 MG CAPS Take 500 mg by mouth 2 (two) times daily.   Yes Historical Provider, MD  vitamin B-12 (CYANOCOBALAMIN) 1000 MCG tablet Take 1,000 mcg by mouth daily.   Yes Historical Provider, MD    Review of Systems    Overall doing well.  She denies chest pain,  palpitations, dyspnea, pnd, orthopnea, n, v, dizziness, syncope, edema, weight gain, or early satiety.  She has a foley in place in the setting of ureterovaginal fistula.  All other systems reviewed and are otherwise negative except as noted above.  Physical Exam    VS:  BP 122/70 (BP Location: Right Arm, Patient Position: Sitting, Cuff Size: Normal)   Pulse 68   Ht 5\' 1"  (1.549 m)   Wt 175 lb (79.4 kg)   BMI 33.07 kg/m  , BMI Body mass index is 33.07 kg/m. GEN: Well nourished, well developed, in no acute distress.  HEENT: normal.  Neck: Supple, no JVD, carotid bruits, or masses. Cardiac: RRR, no murmurs, rubs, or gallops. No clubbing, cyanosis, edema.  Radials/DP/PT 2+ and equal bilaterally.  Respiratory:  Respirations regular  and unlabored, clear to auscultation bilaterally. GI: Soft, nontender, nondistended, BS + x 4. MS: no deformity or atrophy. Skin: warm and dry, no rash. Neuro:  Strength and sensation are intact. Psych: Normal affect.  Accessory Clinical Findings    ECG - RSR, 68, nonspecific st changes, prolonged QT - no acute changes.  Assessment & Plan    1.  CAD/preoperative cardiac evaluation:  S/p prior PCI/DES to the LAD and PTCA to the ostial first diagonal in 02/2012.  Subsequent Myoview in 02/2013 was low risk.  She was recently hospitalized for fever/SIRS in the setting of post-op pelvic abscess and was noted to have mild, flat, troponin elevation in the absence of chest pain, dyspnea, or ecg changes.  Since then, she has been ambulating without chest pain, dyspnea, or limitations in activity.  ECG remains stable.  She is now scheduled for a urologic procedure under general anesthesia for Monday 3/5.  I discussed her case with Dr. [name] today.  In the absence of symptoms with fair exercise tolerance, she may proceed to surgery w/o further ischemic eval and would be considered low risk for a cardiac complication related to surgery/anesthesia.  She should continue her asa,  statin, and  blocker throughout the perioperative period.  2.  Endometrial Cancer/Ureterovaginal Fistula:  She did well from a cardiac standpoint with hysterectomy and BSO.  Pending urologic procedure on Monday r/t ureterovaginal fistula.  As above, further ischemic eval warranted.  3.  Essential HTN:  Stable on  blocker.   4.  HL:  LDL 51 in 05/2016.  Cont statin rx.  Murray Hodgkins, NP 08/14/2016, 12:24 PM

## 2016-08-16 MED ORDER — CEFAZOLIN SODIUM-DEXTROSE 2-4 GM/100ML-% IV SOLN
2.0000 g | INTRAVENOUS | Status: AC
  Administered 2016-08-17: 2 g via INTRAVENOUS

## 2016-08-17 ENCOUNTER — Ambulatory Visit: Payer: PPO | Admitting: Anesthesiology

## 2016-08-17 ENCOUNTER — Ambulatory Visit
Admission: RE | Admit: 2016-08-17 | Discharge: 2016-08-17 | Disposition: A | Discharge status: Home / Self Care | Payer: PPO | Source: Ambulatory Visit | Attending: Urology | Admitting: Urology

## 2016-08-17 ENCOUNTER — Encounter: Payer: Self-pay | Admitting: Anesthesiology

## 2016-08-17 ENCOUNTER — Encounter
Admission: RE | Disposition: A | Discharge status: Home / Self Care | Payer: Self-pay | Source: Ambulatory Visit | Attending: Urology

## 2016-08-17 DIAGNOSIS — Z87891 Personal history of nicotine dependence: Secondary | ICD-10-CM | POA: Diagnosis not present

## 2016-08-17 DIAGNOSIS — N7689 Other specified inflammation of vagina and vulva: Secondary | ICD-10-CM | POA: Insufficient documentation

## 2016-08-17 DIAGNOSIS — Z8542 Personal history of malignant neoplasm of other parts of uterus: Secondary | ICD-10-CM | POA: Diagnosis not present

## 2016-08-17 DIAGNOSIS — R32 Unspecified urinary incontinence: Secondary | ICD-10-CM | POA: Diagnosis not present

## 2016-08-17 DIAGNOSIS — Z9889 Other specified postprocedural states: Secondary | ICD-10-CM | POA: Diagnosis not present

## 2016-08-17 DIAGNOSIS — I2510 Atherosclerotic heart disease of native coronary artery without angina pectoris: Secondary | ICD-10-CM | POA: Diagnosis not present

## 2016-08-17 DIAGNOSIS — K651 Peritoneal abscess: Secondary | ICD-10-CM | POA: Diagnosis not present

## 2016-08-17 DIAGNOSIS — I10 Essential (primary) hypertension: Secondary | ICD-10-CM | POA: Insufficient documentation

## 2016-08-17 DIAGNOSIS — Z90710 Acquired absence of both cervix and uterus: Secondary | ICD-10-CM | POA: Insufficient documentation

## 2016-08-17 DIAGNOSIS — Z791 Long term (current) use of non-steroidal anti-inflammatories (NSAID): Secondary | ICD-10-CM | POA: Diagnosis not present

## 2016-08-17 DIAGNOSIS — Z833 Family history of diabetes mellitus: Secondary | ICD-10-CM | POA: Insufficient documentation

## 2016-08-17 DIAGNOSIS — Z823 Family history of stroke: Secondary | ICD-10-CM | POA: Insufficient documentation

## 2016-08-17 DIAGNOSIS — Z883 Allergy status to other anti-infective agents status: Secondary | ICD-10-CM | POA: Insufficient documentation

## 2016-08-17 DIAGNOSIS — Z882 Allergy status to sulfonamides status: Secondary | ICD-10-CM | POA: Insufficient documentation

## 2016-08-17 DIAGNOSIS — Z881 Allergy status to other antibiotic agents status: Secondary | ICD-10-CM | POA: Insufficient documentation

## 2016-08-17 DIAGNOSIS — Z955 Presence of coronary angioplasty implant and graft: Secondary | ICD-10-CM | POA: Insufficient documentation

## 2016-08-17 DIAGNOSIS — Y848 Other medical procedures as the cause of abnormal reaction of the patient, or of later complication, without mention of misadventure at the time of the procedure: Secondary | ICD-10-CM | POA: Diagnosis not present

## 2016-08-17 DIAGNOSIS — Z7982 Long term (current) use of aspirin: Secondary | ICD-10-CM | POA: Insufficient documentation

## 2016-08-17 DIAGNOSIS — T814XXA Infection following a procedure, initial encounter: Secondary | ICD-10-CM | POA: Insufficient documentation

## 2016-08-17 DIAGNOSIS — M1990 Unspecified osteoarthritis, unspecified site: Secondary | ICD-10-CM | POA: Diagnosis not present

## 2016-08-17 DIAGNOSIS — Z79891 Long term (current) use of opiate analgesic: Secondary | ICD-10-CM | POA: Insufficient documentation

## 2016-08-17 DIAGNOSIS — Z820 Family history of epilepsy and other diseases of the nervous system: Secondary | ICD-10-CM | POA: Insufficient documentation

## 2016-08-17 DIAGNOSIS — Z79899 Other long term (current) drug therapy: Secondary | ICD-10-CM | POA: Diagnosis not present

## 2016-08-17 DIAGNOSIS — Z8042 Family history of malignant neoplasm of prostate: Secondary | ICD-10-CM | POA: Insufficient documentation

## 2016-08-17 DIAGNOSIS — N3945 Continuous leakage: Secondary | ICD-10-CM | POA: Diagnosis not present

## 2016-08-17 DIAGNOSIS — Z8571 Personal history of Hodgkin lymphoma: Secondary | ICD-10-CM | POA: Insufficient documentation

## 2016-08-17 DIAGNOSIS — I739 Peripheral vascular disease, unspecified: Secondary | ICD-10-CM | POA: Insufficient documentation

## 2016-08-17 DIAGNOSIS — I700 Atherosclerosis of aorta: Secondary | ICD-10-CM | POA: Insufficient documentation

## 2016-08-17 DIAGNOSIS — Z803 Family history of malignant neoplasm of breast: Secondary | ICD-10-CM | POA: Insufficient documentation

## 2016-08-17 DIAGNOSIS — N739 Female pelvic inflammatory disease, unspecified: Secondary | ICD-10-CM

## 2016-08-17 DIAGNOSIS — Z91048 Other nonmedicinal substance allergy status: Secondary | ICD-10-CM | POA: Diagnosis not present

## 2016-08-17 DIAGNOSIS — F419 Anxiety disorder, unspecified: Secondary | ICD-10-CM | POA: Diagnosis not present

## 2016-08-17 DIAGNOSIS — N132 Hydronephrosis with renal and ureteral calculous obstruction: Secondary | ICD-10-CM | POA: Diagnosis not present

## 2016-08-17 DIAGNOSIS — K219 Gastro-esophageal reflux disease without esophagitis: Secondary | ICD-10-CM | POA: Insufficient documentation

## 2016-08-17 HISTORY — PX: CYSTOSCOPY WITH STENT PLACEMENT: SHX5790

## 2016-08-17 HISTORY — PX: CYSTOSCOPY W/ RETROGRADES: SHX1426

## 2016-08-17 HISTORY — PX: CYSTOSCOPY: SHX5120

## 2016-08-17 HISTORY — PX: CYSTOGRAM: SHX6285

## 2016-08-17 LAB — POCT I-STAT 4, (NA,K, GLUC, HGB,HCT)
Glucose, Bld: 102 mg/dL — ABNORMAL HIGH (ref 65–99)
HCT: 39 % (ref 36.0–46.0)
Hemoglobin: 13.3 g/dL (ref 12.0–15.0)
Potassium: 4 mmol/L (ref 3.5–5.1)
Sodium: 141 mmol/L (ref 135–145)

## 2016-08-17 SURGERY — CYSTOSCOPY
Anesthesia: General | Site: Ureter | Laterality: Right | Wound class: Clean

## 2016-08-17 MED ORDER — SUCCINYLCHOLINE CHLORIDE 20 MG/ML IJ SOLN
INTRAMUSCULAR | Status: DC | PRN
  Administered 2016-08-17: 100 mg via INTRAVENOUS

## 2016-08-17 MED ORDER — TAMSULOSIN HCL 0.4 MG PO CAPS: 0.4000 mg | ORAL_CAPSULE | Freq: Every day | ORAL | 0 refills | Status: DC

## 2016-08-17 MED ORDER — SUGAMMADEX SODIUM 200 MG/2ML IV SOLN
INTRAVENOUS | Status: DC | PRN
  Administered 2016-08-17: 200 mg via INTRAVENOUS

## 2016-08-17 MED ORDER — LIDOCAINE HCL (CARDIAC) 20 MG/ML IV SOLN
INTRAVENOUS | Status: DC | PRN
  Administered 2016-08-17: 80 mg via INTRAVENOUS

## 2016-08-17 MED ORDER — ONDANSETRON HCL 4 MG/2ML IJ SOLN
INTRAMUSCULAR | Status: DC | PRN
  Administered 2016-08-17: 4 mg via INTRAVENOUS

## 2016-08-17 MED ORDER — FENTANYL CITRATE (PF) 100 MCG/2ML IJ SOLN: 25.0000 ug | INTRAMUSCULAR | Status: DC | PRN

## 2016-08-17 MED ORDER — MIDAZOLAM HCL 2 MG/2ML IJ SOLN
INTRAMUSCULAR | Status: AC
  Filled 2016-08-17: qty 2

## 2016-08-17 MED ORDER — DEXAMETHASONE SODIUM PHOSPHATE 10 MG/ML IJ SOLN
INTRAMUSCULAR | Status: DC | PRN
  Administered 2016-08-17: 4 mg via INTRAVENOUS

## 2016-08-17 MED ORDER — FENTANYL CITRATE (PF) 100 MCG/2ML IJ SOLN
INTRAMUSCULAR | Status: DC | PRN
  Administered 2016-08-17: 25 ug via INTRAVENOUS
  Administered 2016-08-17: 75 ug via INTRAVENOUS

## 2016-08-17 MED ORDER — SUCCINYLCHOLINE CHLORIDE 20 MG/ML IJ SOLN
INTRAMUSCULAR | Status: AC
  Filled 2016-08-17: qty 1

## 2016-08-17 MED ORDER — LACTATED RINGERS IV SOLN
INTRAVENOUS | Status: DC
  Administered 2016-08-17: 07:00:00 via INTRAVENOUS

## 2016-08-17 MED ORDER — MIDAZOLAM HCL 2 MG/2ML IJ SOLN
INTRAMUSCULAR | Status: DC | PRN
  Administered 2016-08-17: 2 mg via INTRAVENOUS

## 2016-08-17 MED ORDER — METHYLENE BLUE 0.5 % INJ SOLN
INTRAVENOUS | Status: AC
  Filled 2016-08-17: qty 10

## 2016-08-17 MED ORDER — SUGAMMADEX SODIUM 200 MG/2ML IV SOLN
INTRAVENOUS | Status: AC
  Filled 2016-08-17: qty 2

## 2016-08-17 MED ORDER — PHENYLEPHRINE HCL 10 MG/ML IJ SOLN
INTRAMUSCULAR | Status: DC | PRN
  Administered 2016-08-17: 200 ug via INTRAVENOUS
  Administered 2016-08-17 (×2): 100 ug via INTRAVENOUS

## 2016-08-17 MED ORDER — BELLADONNA ALKALOIDS-OPIUM 16.2-60 MG RE SUPP
1.0000 | Freq: Once | RECTAL | Status: AC
  Administered 2016-08-17: 1 via RECTAL

## 2016-08-17 MED ORDER — BELLADONNA ALKALOIDS-OPIUM 16.2-60 MG RE SUPP
RECTAL | Status: AC
  Administered 2016-08-17: 1 via RECTAL
  Filled 2016-08-17: qty 1

## 2016-08-17 MED ORDER — PROPOFOL 10 MG/ML IV BOLUS
INTRAVENOUS | Status: AC
  Filled 2016-08-17: qty 20

## 2016-08-17 MED ORDER — PROPOFOL 10 MG/ML IV BOLUS
INTRAVENOUS | Status: DC | PRN
  Administered 2016-08-17: 150 mg via INTRAVENOUS

## 2016-08-17 MED ORDER — SODIUM CHLORIDE 0.9 % IV SOLN
INTRAVENOUS | Status: DC | PRN
  Administered 2016-08-17: 30 ug/min via INTRAVENOUS

## 2016-08-17 MED ORDER — ONDANSETRON HCL 4 MG/2ML IJ SOLN
INTRAMUSCULAR | Status: AC
  Filled 2016-08-17: qty 2

## 2016-08-17 MED ORDER — FAMOTIDINE 20 MG PO TABS
20.0000 mg | ORAL_TABLET | Freq: Once | ORAL | Status: AC
  Administered 2016-08-17: 20 mg via ORAL

## 2016-08-17 MED ORDER — DEXAMETHASONE SODIUM PHOSPHATE 10 MG/ML IJ SOLN
INTRAMUSCULAR | Status: AC
  Filled 2016-08-17: qty 1

## 2016-08-17 MED ORDER — FENTANYL CITRATE (PF) 100 MCG/2ML IJ SOLN
INTRAMUSCULAR | Status: AC
  Filled 2016-08-17: qty 2

## 2016-08-17 MED ORDER — ROCURONIUM BROMIDE 50 MG/5ML IV SOLN
INTRAVENOUS | Status: AC
  Filled 2016-08-17: qty 1

## 2016-08-17 MED ORDER — IOTHALAMATE MEGLUMINE 43 % IV SOLN
INTRAVENOUS | Status: DC | PRN
  Administered 2016-08-17: 100 mL

## 2016-08-17 MED ORDER — OXYBUTYNIN CHLORIDE 5 MG PO TABS: 5.0000 mg | ORAL_TABLET | Freq: Three times a day (TID) | ORAL | 0 refills | Status: DC | PRN

## 2016-08-17 MED ORDER — ONDANSETRON HCL 4 MG/2ML IJ SOLN: 4.0000 mg | Freq: Once | INTRAMUSCULAR | Status: DC | PRN

## 2016-08-17 MED ORDER — ROCURONIUM BROMIDE 100 MG/10ML IV SOLN
INTRAVENOUS | Status: DC | PRN
  Administered 2016-08-17: 30 mg via INTRAVENOUS
  Administered 2016-08-17 (×2): 5 mg via INTRAVENOUS

## 2016-08-17 MED ORDER — PHENYLEPHRINE HCL 10 MG/ML IJ SOLN
INTRAMUSCULAR | Status: AC
  Filled 2016-08-17: qty 1

## 2016-08-17 SURGICAL SUPPLY — 28 items
BAG DRAIN CYSTO-URO LG1000N (MISCELLANEOUS) ×4 IMPLANT
BRUSH SCRUB EZ 1% IODOPHOR (MISCELLANEOUS) ×4 IMPLANT
CATH ROBINSON RED A/P 16FR (CATHETERS) ×4 IMPLANT
CATH URETL 5X70 OPEN END (CATHETERS) ×4 IMPLANT
CONRAY 43 FOR UROLOGY 50M (MISCELLANEOUS) ×8 IMPLANT
DRAPE UTILITY 15X26 TOWEL STRL (DRAPES) ×4 IMPLANT
GLOVE BIO SURGEON STRL SZ 6.5 (GLOVE) ×4 IMPLANT
GOWN STRL REUS W/ TWL LRG LVL3 (GOWN DISPOSABLE) ×6 IMPLANT
GOWN STRL REUS W/TWL LRG LVL3 (GOWN DISPOSABLE) ×2
IV CATH ANGIO 12GX3 LT BLUE (NEEDLE) ×4 IMPLANT
KIT RM TURNOVER CYSTO AR (KITS) ×4 IMPLANT
NEEDLE FILTER BLUNT 18X 1/2SAF (NEEDLE) ×1
NEEDLE FILTER BLUNT 18X1 1/2 (NEEDLE) ×3 IMPLANT
PACK CYSTO AR (MISCELLANEOUS) ×4 IMPLANT
SCRUB POVIDONE IODINE 4 OZ (MISCELLANEOUS) ×4 IMPLANT
SENSORWIRE 0.038 NOT ANGLED (WIRE) ×4
SET CYSTO W/LG BORE CLAMP LF (SET/KITS/TRAYS/PACK) ×4 IMPLANT
SOL .9 NS 3000ML IRR  AL (IV SOLUTION) ×1
SOL .9 NS 3000ML IRR UROMATIC (IV SOLUTION) ×3 IMPLANT
STENT URET 6FRX24 CONTOUR (STENTS) IMPLANT
STENT URET 6FRX26 CONTOUR (STENTS) IMPLANT
STENT URO INLAY 6FRX24CM (STENTS) ×4 IMPLANT
SURGILUBE 2OZ TUBE FLIPTOP (MISCELLANEOUS) ×4 IMPLANT
SYR 10ML LL (SYRINGE) ×8 IMPLANT
SYR 20CC LL (SYRINGE) ×12 IMPLANT
SYRINGE IRR TOOMEY STRL 70CC (SYRINGE) ×8 IMPLANT
WATER STERILE IRR 1000ML POUR (IV SOLUTION) ×4 IMPLANT
WIRE SENSOR 0.038 NOT ANGLED (WIRE) ×3 IMPLANT

## 2016-08-17 NOTE — H&P (View-Only) (Signed)
08/14/2016 11:48 AM   Jaime Hall 1952-05-67 [id number]  Referring provider: Maceo Pro, MD [address], [place] [address]  Chief Complaint  Patient presents with  . 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 Vikki Ports Ward and Dr. [name] on 07/29/2016. Intraoperatively, severe pelvic abdominal adhesions were noted and she underwent extensive adhesion lysis.  Her postop course was complicated by readmission with fever to 102 found to have a 3 cm pelvic abscess in her left lower quadrant. She was treated with IV antibiotics transitioned to 14 days of Augmentin. Urine and blood cultures were negative.  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 No evidence of nodal disease. PT1bN0.  She was referred for further evaluation secondary to vaginal leakage. Per Dr. Leonides Schanz, she underwent a double tied tests with oral Pyridium as well as backfilling the bladder with blue. 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  Fluid in her vaginal vault was also appreciated. This was clear straw colored, non-odorous.  This fluid was unable to be sent for fluid creatinine.  She reports that she first noted vaginal leakage in the first few days after surgery. This is gone progressively worse. A Foley catheter was placed 2 days ago and there is been some minimal improvement. She is been wearing tampons. She states the fluid is clear to light yellow in color. It did change to [facility] when she started taking oral Pyridium. It is distressing to her.  She denies any bowel symptoms including no diarrhea, loose stool, fecal incontinence, or constipation.  She does have multiple medical  comorbidities including a history of coronary artery disease. She did have a mild troponin bump likely due to the demand ischemia during her readmission. Cardiac follow-up has been arranged for later today.   She is still on Augmentin.      PMH: Past Medical History:  Diagnosis Date  . Anxiety   . Cervicalgia   . Coronary artery disease 02/2012   Abnormal stress test with anterior wall ischemia. Cardiac catheterization showed a 95% stenosis in the proximal LAD bifurcating with a 90% stenosis in first diagonal. Ejection fraction was 45% with anterior wall hypokinesis. She underwent balloon angioplasty to ostial first diagonal and drug-eluting stent placement to proximal LAD with a 3.0 x 15 mm Xience EX drug-eluting stent  . Endometrial cancer ([place])   . Essential hypertension, benign   . Fibrocystic breast disease   . GERD (gastroesophageal reflux disease)   . Gestational hypertension   . Heart disease   . Heart murmur   . History of anemia   . History of blood transfusion   . Hodgkin's lymphoma ([place]) 2011   s/p radiation and chemo therapy  . Osteoarthritis   . Polycystic ovarian disease     Surgical History: Past Surgical History:  Procedure Laterality Date  . ABDOMINAL HYSTERECTOMY    . bladder sling    . CARDIAC CATHETERIZATION  02/2012   [facility] 1 stent place  . CERVICAL POLYPECTOMY    . CHOLECYSTECTOMY  1982  . COLONOSCOPY WITH PROPOFOL N/A 02/05/2015   Procedure: COLONOSCOPY WITH PROPOFOL;  Surgeon: Lucilla Lame, MD;  Location: [facility] ENDOSCOPY;  Service: Endoscopy;  Laterality: N/A;  . CORONARY ANGIOPLASTY  02/2012  left/right s/p balloon  . heart stent'  2013  . LYMPH NODE BIOPSY  2011   diagnosis of hodgkins lymphoma  . PELVIC LYMPH NODE DISSECTION N/A 07/29/2016   Procedure: PELVIC/AORTIC LYMPH NODE SAMPLING;  Surgeon: [name], MD;  Location: [facility] ORS;  Service: Gynecology;  Laterality: N/A;  . ROBOTIC ASSISTED TOTAL HYSTERECTOMY WITH BILATERAL SALPINGO  OOPHERECTOMY N/A 07/29/2016   Procedure: ROBOTIC ASSISTED TOTAL HYSTERECTOMY WITH BILATERAL SALPINGO OOPHORECTOMY;  Surgeon: [name], MD;  Location: [facility] ORS;  Service: Gynecology;  Laterality: N/A;  . SENTINEL NODE BIOPSY N/A 07/29/2016   Procedure: SENTINEL NODE BIOPSY;  Surgeon: [name], MD;  Location: [facility] ORS;  Service: Gynecology;  Laterality: N/A;  . transobturator sling N/A 2009   [place]    Home Medications:  Allergies as of 08/14/2016      Reactions   Adhesive [tape] Other (See Comments)   Burns the skin   Z-pak [azithromycin] Itching   Antifungal [miconazole Nitrate] Rash   Sulfa Antibiotics Nausea And Vomiting, Rash   N/T      Medication List       Accurate as of 08/14/16 11:48 AM. Always use your most recent med list.          acetaminophen 500 MG tablet Commonly known as:  TYLENOL Take 1,000 mg by mouth every 6 (six) hours as needed.   amoxicillin-clavulanate 875-125 MG tablet Commonly known as:  AUGMENTIN Take 1 tablet by mouth 2 (two) times daily.   aspirin EC 81 MG tablet Take 81 mg by mouth at bedtime.   atorvastatin 10 MG tablet Commonly known as:  LIPITOR TAKE ONE TABLET BY MOUTH ONCE DAILY   b complex vitamins tablet Take 1 tablet by mouth at bedtime.   diazepam 5 MG tablet Commonly known as:  VALIUM Take 1 tablet (5 mg total) by mouth every 12 (twelve) hours as needed for anxiety.   esomeprazole 20 MG capsule Commonly known as:  NEXIUM Take 20 mg by mouth daily before breakfast.   ibuprofen 200 MG tablet Commonly known as:  ADVIL,MOTRIN Take 200 mg by mouth every 6 (six) hours as needed.   metoprolol tartrate 25 MG tablet Commonly known as:  LOPRESSOR TAKE ONE TABLET BY MOUTH TWICE DAILY   naproxen sodium 220 MG tablet Commonly known as:  ANAPROX Take 440 mg by mouth 2 (two) times daily as needed (for pain/headache.).   nitroGLYCERIN 0.4 MG SL tablet Commonly known as:  NITROSTAT Place 1 tablet (0.4 mg  total) under the tongue every 5 (five) minutes as needed for chest pain.   oxyCODONE 5 MG immediate release tablet Commonly known as:  ROXICODONE Take 1 tablet (5 mg total) by mouth every 4 (four) hours as needed for severe pain.   Turmeric 500 MG Caps Take 500 mg by mouth 2 (two) times daily.   vitamin B-12 1000 MCG tablet Commonly known as:  CYANOCOBALAMIN Take 1,000 mcg by mouth daily.   Vitamin D3 2000 units Tabs Take 2,000 Units by mouth 3 (three) times a week.       Allergies:  Allergies  Allergen Reactions  . Adhesive [Tape] Other (See Comments)    Burns the skin  . Z-Pak [Azithromycin] Itching  . Antifungal [Miconazole Nitrate] Rash  . Sulfa Antibiotics Nausea And Vomiting and Rash    N/T    Family History: Family History  Problem Relation Age of Onset  . ALS Father   . Polymyositis Father   . Diabetes Brother   .  Cancer Maternal Aunt     breast  . Breast cancer Maternal Aunt     30's  . Stroke Maternal Grandmother   . Cancer Maternal Grandfather     prostate  . Stroke Maternal Grandfather     Social History:  reports that she quit smoking about 14 years ago. Her smoking use included Cigarettes. She has a 30.00 pack-year smoking history. She has never used smokeless tobacco. She reports that she drinks alcohol. She reports that she does not use drugs.  ROS: UROLOGY Frequent Urination?: No Hard to postpone urination?: No Burning/pain with urination?: No Get up at night to urinate?: No Leakage of urine?: Yes Urine stream starts and stops?: No Trouble starting stream?: No Do you have to strain to urinate?: No Blood in urine?: No Urinary tract infection?: No Sexually transmitted disease?: No Injury to kidneys or bladder?: No Painful intercourse?: No Weak stream?: No Currently pregnant?: No Vaginal bleeding?: No Last menstrual period?: n  Gastrointestinal Nausea?: No Vomiting?: No Indigestion/heartburn?: No Diarrhea?: No Constipation?:  No  Constitutional Fever: No Night sweats?: No Weight loss?: No Fatigue?: No  Skin Skin rash/lesions?: No Itching?: No  Eyes Blurred vision?: No Double vision?: No  Ears/Nose/Throat Sore throat?: No Sinus problems?: No  Hematologic/Lymphatic Swollen glands?: No Easy bruising?: No  Cardiovascular Leg swelling?: No Chest pain?: No  Respiratory Cough?: No Shortness of breath?: No  Endocrine Excessive thirst?: No  Musculoskeletal Back pain?: No Joint pain?: No  Neurological Headaches?: No Dizziness?: No  Psychologic Depression?: No Anxiety?: No  Physical Exam: BP 121/73   Pulse 92   Ht 5' 1.5" (1.562 m)   Wt 174 lb 12.8 oz (79.3 kg)   BMI 32.49 kg/m   Constitutional:  Alert and oriented, No acute distress. HEENT: [place] AT, moist mucus membranes.  Trachea midline, no masses. Cardiovascular: No clubbing, cyanosis, or edema. Respiratory: Normal respiratory effort, no increased work of breathing. GI: Abdomen is soft, nontender, nondistended, no abdominal masses GU: No CVA tenderness.  Skin: No rashes, bruises or suspicious lesions. Neurologic: Grossly intact, no focal deficits, moving all 4 extremities. Psychiatric: Normal mood and affect.  Laboratory Data: Lab Results  Component Value Date   WBC 10.3 08/07/2016   HGB 11.0 (L) 08/07/2016   HCT 31.3 (L) 08/07/2016   MCV 87.7 08/07/2016   PLT 181 08/07/2016    Lab Results  Component Value Date   CREATININE 0.86 08/07/2016     Lab Results  Component Value Date   HGBA1C 5.8 05/26/2016    Urinalysis    Component Value Date/Time   COLORURINE YELLOW (A) 08/05/2016 1950   APPEARANCEUR CLEAR (A) 08/05/2016 1950   LABSPEC 1.018 08/05/2016 1950   PHURINE 6.0 08/05/2016 1950   GLUCOSEU NEGATIVE 08/05/2016 1950   GLUCOSEU NEGATIVE 07/07/2016 1418   HGBUR MODERATE (A) 08/05/2016 1950   BILIRUBINUR NEGATIVE 08/05/2016 1950   BILIRUBINUR negative 07/07/2016 1429   KETONESUR 5 (A) 08/05/2016 1950    PROTEINUR 100 (A) 08/05/2016 1950   UROBILINOGEN 0.2 07/07/2016 1429   UROBILINOGEN 0.2 07/07/2016 1418   NITRITE NEGATIVE 08/05/2016 1950   LEUKOCYTESUR MODERATE (A) 08/05/2016 1950    Pertinent Imaging: CLINICAL DATA:  Status post robotic TAHBSO 07/29/2016 for endometrial cancer (FIGO 1B), complicated by preadmission for pelvic abscess at the hysterectomy margin 08/05/2016, treated with antibiotic therapy. Patient reportedly is preoperative for repair of a vaginal fistula.  EXAM: CT ABDOMEN AND PELVIS WITHOUT AND WITH CONTRAST  TECHNIQUE: Multidetector CT imaging of the abdomen and pelvis  was performed following the standard protocol before and following the bolus administration of intravenous contrast.  CONTRAST:  125 cc ISOVUE-300 IOPAMIDOL (ISOVUE-300) INJECTION 61%  COMPARISON:  08/05/2016 CT abdomen/ pelvis.  FINDINGS: Lower chest: No significant pulmonary nodules or acute consolidative airspace disease.  Hepatobiliary: Normal liver with no liver mass. Cholecystectomy. Bile ducts are stable and within expected post cholecystectomy limits with common bile duct diameter 8 mm and no radiopaque choledocholithiasis.  Pancreas: Normal, with no mass or duct dilation.  Spleen: Normal size. No mass.  Adrenals/Urinary Tract: Normal adrenals. No renal stones. No hydronephrosis. Simple 2.3 cm lower right renal cyst. Additional subcentimeter hypodense renal cortical lesions in both kidneys are too small to characterize and appear unchanged, and require no further follow-up. No new renal lesions. Normal caliber ureters, with no ureteral stones. No contrast extravasation from the ureters, noting non opacification of the lower lumbar and pelvic segments of the right ureter. No filling defects in the renal collecting systems or opacified portions of the ureters. Bladder is nearly collapsed by indwelling Foley catheter. No bladder wall thickening. No discrete bladder  mass. No bladder stones. No evidence of contrast extravasation from the bladder, which is incompletely opacified and nearly collapsed.  Stomach/Bowel: Grossly normal stomach. Normal caliber small bowel with no small bowel wall thickening. Normal appendix. Moderate sigmoid diverticulosis. No definite large bowel wall thickening. Mild stool throughout the large bowel.  Vascular/Lymphatic: Atherosclerotic nonaneurysmal abdominal aorta. Patent portal, splenic, hepatic and renal veins. No pathologically enlarged lymph nodes in the abdomen or pelvis.  Reproductive: Status posthysterectomy. There is a 3.3 x 1.7 cm gas containing collection at the vaginal cuff (series 7/image 74), previously 3.3 x 1.7 cm using similar measurement technique on 08/05/2016 CT study, not appreciably changed in size, which demonstrates an increasingly thick hyperenhancing wall with superior extension along the left pelvic sidewall, abutting the posterior margin of the mid sigmoid colon (series 7/image 70).  Other: No ascites. No pneumoperitoneum. No additional focal fluid collections. Fat stranding and ill-defined fluid in the subcutaneous periumbilical ventral abdominal wall at the port site, slightly decreased, without a measurable drainable superficial fluid collection.  Musculoskeletal: No aggressive appearing focal osseous lesions. Moderate thoracolumbar spondylosis.  IMPRESSION: 1. Stable size of an abscess at the vaginal cuff with superior extension along the left pelvic sidewall to the posterior margin of the mid sigmoid colon. 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 2. No hydronephrosis. Normal caliber ureters. No evidence of contrast extravasation from the ureters or bladder, with limitations as described. 3. No evidence of metastatic disease in the abdomen or pelvis. 4. Aortic atherosclerosis.   Electronically Signed   By: Ilona Sorrel M.D.   On: 08/13/2016 16:46  CT  urogram from yesterday was reviewed personally today. Incompletely delayed imaging from the right kidney but otherwise no obvious contrast extravasation or evidence of ureterovaginal fistula from this particular CT scan.  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  No obvious fistula or leak appreciated on CT urogram, although there is no complete delayed imaging to assess the right-sided ureter.  I have recommended today pursuing further diagnosis/intervention in the operating room on Monday. This would include exam under anesthesia, cystoscopy, cystogram, bilateral retrograde pyelogram, and possible ureteral stent placement if a ureterovaginal fistula is identified. The risk and benefits of this in detail.  We discussed that with  the stent, this should help with her vaginal leakage if this is the diagnosis and in a few incidences, prolonged stent alone and has a loud fistula to heal. Otherwise, she will need delayed repair with likely ureteral reimplant/fistula closure.  Cardiac clearance today pending for procedure.   1. Malignant neoplasm of uterus, unspecified site [facility]) Will discuss further with Dr. Cristopher Estimable- plan for chemo?  2. 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 Pelvic abscess in female Contineu course of antibiotics Will need repeat imaging to assess for resolution prior to any definitive management of her initial outside of stenting   Hollice Espy, MD  [employer] [address] [place], [place] [address] [phone]  Case was discussed with Dr. Leonides Schanz today extensively.  I spent 45 min with this patient of which greater  than 50% was spent in counseling and coordination of care with the patient.

## 2016-08-17 NOTE — Anesthesia Postprocedure Evaluation (Signed)
Anesthesia Post Note  Patient: THANIA DEWAELE  Procedure(s) Performed: Procedure(s) (LRB): CYSTOSCOPY EXAM UNDER ANESTHESIA (N/A) CYSTOSCOPY WITH RETROGRADE PYELOGRAM (Bilateral) CYSTOSCOPY WITH STENT PLACEMENT (Right) CYSTOGRAM (N/A)  Patient location during evaluation: PACU Anesthesia Type: General Level of consciousness: awake and alert and oriented Pain management: pain level controlled Vital Signs Assessment: post-procedure vital signs reviewed and stable Respiratory status: spontaneous breathing Cardiovascular status: blood pressure returned to baseline Anesthetic complications: no     Last Vitals:  Vitals:   08/17/16 1006 08/17/16 1014  BP: (!) 138/58 (!) 123/49  Pulse: 66 62  Resp: 16 16  Temp: 36.9 C     Last Pain:  Vitals:   08/17/16 1006  TempSrc:   PainSc: 0-No pain                 Wolf Boulay

## 2016-08-17 NOTE — Anesthesia Post-op Follow-up Note (Cosign Needed)
Anesthesia QCDR form completed.        

## 2016-08-17 NOTE — Interval H&P Note (Signed)
History and Physical Interval Note:  08/17/2016 7:23 AM  Jaime Hall  has presented today for surgery, with the diagnosis of pelvic abscess,incontinence  The various methods of treatment have been discussed with the patient and family. After consideration of risks, benefits and other options for treatment, the patient has consented to  Procedure(s): CYSTOSCOPY EXAM UNDER ANESTHESIA (N/A) CYSTOSCOPY WITH RETROGRADE PYELOGRAM (Bilateral) CYSTOSCOPY WITH STENT PLACEMENT (Bilateral) as a surgical intervention .  The patient's history has been reviewed, patient examined, no change in status, stable for surgery.  I have reviewed the patient's chart and labs.  Questions were answered to the patient's satisfaction.    RRR CTAB  Hollice Espy

## 2016-08-17 NOTE — Anesthesia Preprocedure Evaluation (Addendum)
Anesthesia Evaluation  Patient identified by MRN, date of birth, ID band Patient awake    Reviewed: Allergy & Precautions, NPO status , Patient's Chart, lab work & pertinent test results, reviewed documented beta blocker date and time   Airway Mallampati: III  TM Distance: <3 FB     Dental no notable dental hx.    Pulmonary COPD, former smoker,     + decreased breath sounds      Cardiovascular hypertension, Pt. on medications and Pt. on home beta blockers + angina with exertion + CAD and + Peripheral Vascular Disease  Normal cardiovascular exam+ Valvular Problems/Murmurs      Neuro/Psych Anxiety  Neuromuscular disease    GI/Hepatic negative GI ROS, Neg liver ROS, GERD  Medicated,  Endo/Other  negative endocrine ROS  Renal/GU negative Renal ROS  Female GU complaint  negative genitourinary   Musculoskeletal  (+) Arthritis , Osteoarthritis,    Abdominal (+) + obese,   Peds negative pediatric ROS (+)  Hematology negative hematology ROS (+) anemia ,   Anesthesia Other Findings Overbite Past Medical History: No date: Anxiety No date: Cervicalgia 02/2012: Coronary artery disease     Comment: Abnormal stress test with anterior wall               ischemia. Cardiac catheterization showed a 95%               stenosis in the proximal LAD bifurcating with a              90% stenosis in first diagonal. Ejection               fraction was 45% with anterior wall               hypokinesis. She underwent balloon angioplasty               to ostial first diagonal and drug-eluting stent              placement to proximal LAD with a 3.0 x 15 mm               Xience EX drug-eluting stent No date: Endometrial cancer ([place]) No date: Essential hypertension, benign No date: Fibrocystic breast disease No date: GERD (gastroesophageal reflux disease) No date: Gestational hypertension No date: Heart disease No date: Heart murmur No  date: History of anemia No date: History of blood transfusion 2011: Hodgkin's lymphoma ([place])     Comment: s/p radiation and chemo therapy No date: Osteoarthritis No date: Polycystic ovarian disease  Reproductive/Obstetrics                             Anesthesia Physical  Anesthesia Plan  ASA: III  Anesthesia Plan: General   Post-op Pain Management:    Induction: Intravenous  Airway Management Planned: Oral ETT  Additional Equipment:   Intra-op Plan:   Post-operative Plan: Extubation in OR  Informed Consent: I have reviewed the patients History and Physical, chart, labs and discussed the procedure including the risks, benefits and alternatives for the proposed anesthesia with the patient or authorized representative who has indicated his/her understanding and acceptance.     Plan Discussed with: CRNA, Anesthesiologist and Surgeon  Anesthesia Plan Comments:         Anesthesia Quick Evaluation

## 2016-08-17 NOTE — Discharge Instructions (Signed)
You have a ureteral stent in place.  This is a tube that extends from your kidney to your bladder.  This may cause urinary bleeding, burning with urination, and urinary frequency.  Please call our office or present to the ED if you develop fevers >101 or pain which is not able to be controlled with oral pain medications.  You may be given either Flomax and/ or ditropan to help with bladder spasms and stent pain in addition to pain medications.   ° °[facility] °[address], [address] °[place], [place] [address] °[phone] ° ° ° °AMBULATORY SURGERY  °DISCHARGE INSTRUCTIONS ° ° °1) The drugs that you were given will stay in your system until tomorrow so for the next 24 hours you should not: ° °A) Drive an automobile °B) Make any legal decisions °C) Drink any alcoholic beverage ° ° °2) You may resume regular meals tomorrow.  Today it is better to start with liquids and gradually work up to solid foods. ° °You may eat anything you prefer, but it is better to start with liquids, then soup and crackers, and gradually work up to solid foods. ° ° °3) Please notify your doctor immediately if you have any unusual bleeding, trouble breathing, redness and pain at the surgery site, drainage, fever, or pain not relieved by medication. ° ° ° °4) Additional Instructions: ° ° ° ° ° ° ° °Please contact your physician with any problems or Same Day Surgery at [phone], Monday through Friday 6 am to 4 pm, or [facility] [place] [facility] Main number at [phone]. °

## 2016-08-17 NOTE — Op Note (Signed)
Date of procedure: 08/17/16  Preoperative diagnosis:  1. Pelvic abscess 2. Incontinence/vaginal leakage   Postoperative diagnosis:  1. Same as above   Procedure: 1. Exam under anesthesia 2. Vaginoscopy 3. Cystoscopy 4. Cystogram with interpretation of fluoroscopy less than 30 minutes 5. Bilateral retrograde pyelogram 6. Right ureteral stent placement  Surgeon: Hollice Espy, MD  Anesthesia: General  Complications: None  Intraoperative findings: No obvious ureterovaginal vesicovaginal fistula appreciated. Mild right hydroureteronephrosis down to level of the distal ureter with slight medial deviation without filling defects. Slight mucopurulent drainage from left apical vaginal cuff, specimen cultured.   Specimens:  minimal  Drain: 6 x 24 French double-J ureteral stent on right, Bard Optima  Indication: Jaime Hall is a 66 y.o. patient withwith a history of endometrial cancer status post robotic total abdominal hysterectomy with bilateral salpingectomy, pelvic lymph node dissection who developed a pelvic abscess, located by vaginal drainage. A double tie test by Dr. Leonides Schanz revealed Pyridium on her vaginal tampon concerning for possible ureterovesical fistula.  After reviewing the management options for treatment, she elected to proceed with the above surgical procedure(s). We have discussed the potential benefits and risks of the procedure, side effects of the proposed treatment, the likelihood of the patient achieving the goals of the procedure, and any potential problems that might occur during the procedure or recuperation. Informed consent has been obtained.  Description of procedure:  The patient was taken to the operating room and general anesthesia was induced.  The patient was placed in the dorsal lithotomy position, prepped and draped in the usual sterile fashion, and preoperative antibiotics were administered. A preoperative time-out was performed.   At this point in  time, careful vaginal exam revealed a small erythematous posterior wall vaginal abrasion, presumably from the tampon without any drainage or ulceration at this site. The vaginal cuff itself appeared to be intact and blue colored sutures were appreciated. At the very left apex of the vaginal cuff, there was a very scant amount of mucopurulent drainage which was able to be aspirated and cultured, but not enough fluid volume to be sent for any other studies including for creatinine. There is no significant fluid overall the vaginal vault. Vaginoscopy was then performed and no additional findings were appreciated.   Next, a 74 [place] scope was advanced per urethra into the bladder. There is some edematous changes of the posterior wall consistent with catheter cystitis but otherwise cystoscopy was unremarkable. The trigone was intact with reflux of clear yellow urine from each UO. Attention was turned to the left ureteral orifice which was cannulated using a 5 [place] open-ended ureteral catheter. Gentle retrograde pyelogram was performed on this side which revealed a decompressed ureter without hydroureteronephrosis or filling defects. The left collecting system drained promptly. Attention was then turned to the right ureteral orifice which was cannulated using the same technique. Retrograde pyelogram on this side revealed mild hydroureteronephrosis down to level of the distal ureter with slight medial deviation of the ureter. The very distal ureter was decompressed. No contrast extravasation was seen whatsoever both under low and high pressure retrograde. This side did drain completely but was significantly prolonged compared to the left side. This is concerning for possible ureteral edema versus extrinsic compression. At this point in time, a solution of methylene blue, Optiray, and saline was injected into the right ureter to assess for an occult fistula. Just prior to this, a sponge stick was placed at the vaginal  closure site. Contrast was allowed to then  completely drained from the right ureter with a red rubber in place for bladder decompression. Once this was complete, the sponge stick was removed and no obvious methylene blue was appreciated at the vaginal cuff site. The study was repeated several times and throughout the procedure, no significant vaginal fluid was appreciated and no methylene blue was appreciated on the vaginal gauze.  Finally, the bladder was filled with 300 cc of Optiray to perform a cystogram. No bladder extravasation was seen. The bladder was then drained and postdrainage films were obtained. There is no contrast appreciated residual within the bladder, pelvis, actually peritoneal space, or vagina on fluoroscopy.  Given the delayed drainage of the right system as well as the mild hydroureteronephrosis, did elect to place a stent on the side. A sensor wire was placed up to level of the kidney under fluoroscopic guidance. A 6 x 24 French double-J ureteral stent was advanced over the wire up to level of renal pelvis. The wire was partially drawn until full coil was noted within the renal pelvis. The wire was then fully withdrawn and a full coil was noted within the bladder both on fluoroscopy and cystoscopy. The bladder was then drained. Finally, attention was turned to one less vaginal exam. Again, no significant vaginal fluid was appreciated. No methylene blue was appreciated in the vaginal vault.  The patient was reversed with anesthesia and taken to the PACU in stable condition after being cleaned and dried.   Plan: Case was discussed today with Dr. Leonides Schanz who is briefly present in the operating room. In addition, findings were discussed both with the patient and her daughter-in-law. There is no evidence of ureterovaginal fistular vesicovaginal fistula. We'll plan to leave her right ureteral stent for at least a month while her pelvic abscess resolves. At that point, we'll likely remove her  stent pending her clinical status.  Hollice Espy, M.D.

## 2016-08-17 NOTE — Transfer of Care (Signed)
Immediate Anesthesia Transfer of Care Note  Patient: FIYINFOLUWA BRISCO  Procedure(s) Performed: Procedure(s): CYSTOSCOPY EXAM UNDER ANESTHESIA (N/A) CYSTOSCOPY WITH RETROGRADE PYELOGRAM (Bilateral) CYSTOSCOPY WITH STENT PLACEMENT (Right) CYSTOGRAM (N/A)  Patient Location: PACU  Anesthesia Type:General  Level of Consciousness: awake and alert   Airway & Oxygen Therapy: Patient Spontanous Breathing and Patient connected to face mask oxygen  Post-op Assessment: Report given to RN and Post -op Vital signs reviewed and stable  Post vital signs: Reviewed and stable  Last Vitals:  Vitals:   08/17/16 0629 08/17/16 0910  BP: 132/64 134/68  Pulse: 84 70  Resp: 18   Temp: 36.8 C     Last Pain:  Vitals:   08/17/16 0629  TempSrc: Temporal  PainSc: 0-No pain         Complications: No apparent anesthesia complications

## 2016-08-17 NOTE — Anesthesia Procedure Notes (Signed)
Procedure Name: Intubation Performed by: Jayli Fogleman Pre-anesthesia Checklist: Patient identified, Patient being monitored, Timeout performed, Emergency Drugs available and Suction available Patient Re-evaluated:Patient Re-evaluated prior to inductionOxygen Delivery Method: Circle system utilized Preoxygenation: Pre-oxygenation with 100% oxygen Intubation Type: IV induction Ventilation: Mask ventilation without difficulty Laryngoscope Size: Mac and 3 Grade View: Grade I Tube type: Oral Tube size: 7.0 mm Number of attempts: 1 Airway Equipment and Method: Stylet Placement Confirmation: ETT inserted through vocal cords under direct vision,  positive ETCO2 and breath sounds checked- equal and bilateral Secured at: 22 cm Tube secured with: Tape Dental Injury: Teeth and Oropharynx as per pre-operative assessment        

## 2016-08-18 ENCOUNTER — Telehealth: Payer: Self-pay | Admitting: Urology

## 2016-08-18 NOTE — Telephone Encounter (Signed)
Note, that is only about 3 weeks. I prefer to keep it longer than shorter. We can push it up to 6 weeks if needed.  Hollice Espy, MD

## 2016-08-18 NOTE — Telephone Encounter (Signed)
Patient was notified that the Tamsulosin has less than a 5% cross reaction possibility, patient states she is taking medicaiton with out any problem.  Patient is to call 2 days post op to give an update on her vaginal leakage. Patient verbalized understanding.

## 2016-08-18 NOTE — Telephone Encounter (Signed)
I schd her cysto stent removal for 09-09-16 is this day ok?   michelle

## 2016-08-18 NOTE — Telephone Encounter (Signed)
Pt called office stating that she was told by discharge nurse that Dr. [name] wanted her to call the office today. Pt is not aware of why she was supposed to call, stated that's what Dr. [name] asked them to tell her to do, and pt states she's doing what she was told to do. Pt is allergic to Sulfa and was given Sulfa to take post surgery. Pt thinks this may be the reason. Please advise.

## 2016-08-19 ENCOUNTER — Inpatient Hospital Stay: Payer: PPO | Attending: Obstetrics and Gynecology

## 2016-08-20 NOTE — Telephone Encounter (Signed)
Pt called back to let you know that when she stands up, she feels like she has to use the bathroom, but not much comes out.  She urinates more first thing in the morning.  Should she be having a below normal temperature of 95?  It normally runs around 97.  She also stays very cold.  She hasn't had this before until she starting having surgeries.  Please advise.

## 2016-08-21 ENCOUNTER — Other Ambulatory Visit: Payer: PPO | Admitting: Urology

## 2016-08-21 NOTE — Telephone Encounter (Signed)
Spoke with pt in reference to stent discomfort. Reinforced with pt that the stent discomfort she is currently having is completely normal. Reinforced with pt to drink more fluids and the bleeding would become less and less. Pt voiced understanding of whole conversation.

## 2016-08-22 LAB — ANAEROBIC CULTURE

## 2016-08-25 ENCOUNTER — Telehealth: Payer: Self-pay

## 2016-08-25 DIAGNOSIS — N390 Urinary tract infection, site not specified: Secondary | ICD-10-CM

## 2016-08-25 MED ORDER — METRONIDAZOLE 500 MG PO TABS: 500.0000 mg | ORAL_TABLET | Freq: Three times a day (TID) | ORAL | 0 refills | Status: DC

## 2016-08-25 NOTE — Telephone Encounter (Signed)
-----   Message from Hollice Espy, MD sent at 08/24/2016 12:16 PM EDT ----- Vaginal culture obtained at the time of surgery ultimately grew bacteria, Dr. Earnie Larsson ease which is resistant to the antibiotics that she's been on. I discussed this with Dr. Leonides Schanz who recommends treatment with Flagyl 2 weeks. Please start the patient on this medication and let her know.  Dose is 500 mg tid x 14 days.  Hollice Espy, MD

## 2016-08-25 NOTE — Telephone Encounter (Signed)
Spoke with pt in reference to culture performed and needing flagyl. Pt voiced understanding. Medication sent to pharmacy.

## 2016-09-02 ENCOUNTER — Inpatient Hospital Stay: Payer: PPO | Attending: Obstetrics and Gynecology | Admitting: Obstetrics and Gynecology

## 2016-09-02 ENCOUNTER — Encounter: Payer: Self-pay | Admitting: Obstetrics and Gynecology

## 2016-09-02 ENCOUNTER — Inpatient Hospital Stay: Payer: PPO

## 2016-09-02 VITALS — BP 121/77 | HR 63 | Temp 97.6°F | Resp 18 | Ht 61.0 in | Wt 172.8 lb

## 2016-09-02 DIAGNOSIS — N821 Other female urinary-genital tract fistulae: Secondary | ICD-10-CM

## 2016-09-02 DIAGNOSIS — Z48816 Encounter for surgical aftercare following surgery on the genitourinary system: Secondary | ICD-10-CM | POA: Insufficient documentation

## 2016-09-02 DIAGNOSIS — C541 Malignant neoplasm of endometrium: Secondary | ICD-10-CM | POA: Insufficient documentation

## 2016-09-02 LAB — CREATININE, SERUM
Creatinine, Ser: 1.06 mg/dL — ABNORMAL HIGH (ref 0.44–1.00)
GFR calc Af Amer: >60 mL/min (ref >60)
GFR calc non Af Amer: 53 mL/min — ABNORMAL LOW (ref >60)

## 2016-09-02 LAB — CBC WITH DIFFERENTIAL/PLATELET
Basophils Absolute: 0.1 10*3/uL (ref 0–0.1)
Basophils Relative: 1 %
Eosinophils Absolute: 0.2 10*3/uL (ref 0–0.7)
Eosinophils Relative: 2 %
HCT: 40.5 % (ref 35.0–47.0)
Hemoglobin: 13.7 g/dL (ref 12.0–16.0)
Lymphocytes Relative: 17 %
Lymphs Abs: 1.5 10*3/uL (ref 1.0–3.6)
MCH: 29.7 pg (ref 26.0–34.0)
MCHC: 33.9 g/dL (ref 32.0–36.0)
MCV: 87.7 fL (ref 80.0–100.0)
Monocytes Absolute: 0.6 10*3/uL (ref 0.2–0.9)
Monocytes Relative: 6 %
Neutro Abs: 6.4 10*3/uL (ref 1.4–6.5)
Neutrophils Relative %: 74 %
Platelets: 196 10*3/uL (ref 150–440)
RBC: 4.62 MIL/uL (ref 3.80–5.20)
RDW: 14.4 % (ref 11.5–14.5)
WBC: 8.7 10*3/uL (ref 3.6–11.0)

## 2016-09-02 NOTE — Progress Notes (Signed)
  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 [name]. Navigator Location: [facility] (09/02/16 1500)   )Navigator Encounter Type: Letter/Fax/Email (09/02/16 1500)                                                    Time Spent with Patient: 15 (09/02/16 1500)

## 2016-09-02 NOTE — Progress Notes (Signed)
Gynecologic Oncology Interval Visit   Referring Provider: Dr. Servando Salina  Chief Concern: Grade 1 endometrial cancer  Subjective:  Jaime Hall is a 66 y.o. G2P2 female who is seen in consultation from [name] and Garwin Brothers for grade 1 endometrial cancer.  On 07/29/2016 she underwent robotic hysterectomy, and bilateral salpingo-oophorectomy with washings as well as sentinel node injection, mapping, and biopsy; pelvic adhesiolysis including enterolysis and ovariolysis > 45 minutes. She was noted to have severe pelvic adhesive disease involving gynecologic organs and the bowel. The cul de sac was obliterated and adherent to the posterior cervix. A bubble test was performed and was negative.   Postop she presented with fevers on 08/05/2016 and was admitted. CT scan 08/05/1016 revealed small pockets of air in the posterior pelvic floor likely postsurgical. There is a 1.8 x 3.8 cm complex fluid collection containing pockets of air with somewhat organized walls within the pelvis at the hysterectomy bed most consistent with developing abscess. There is superior extension of loculated fluid along the left lateral pelvic wall anterior to the left psoas muscle. The component of the loculated fluid along the left lateral pelvic wall measures 2.0 x 4.0 cm and fluid collection along the anterior surface of the left psoas muscle measures approximately 1.8 x 3.6 cm. Blood cultures: + bacterimia with bacteroides fragilis; urine cultures negative.   She was treated with IV antibiotics with improvement in her symptoms. She was discharged on 09/05/2015 on Augmentin.   She was seen by Dr. Leonides Schanz and complained of vaginal leakage. 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 Subsequently she underwent a double tied tests with oral Pyridium as well as backfilling the bladder with blue. 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   Fluid in her vaginal vault was also appreciated. She was referred to Dr. [name], Urology on 08/14/2016. No obvious fistula or leak appreciated on CT urogram, although there is no complete delayed imaging to assess the right-sided ureter.   CT urogram 08/13/2016 IMPRESSION: 1. Stable size of an abscess at the vaginal cuff with superior extension along the left pelvic sidewall to the posterior margin of the mid sigmoid colon. 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 2. No hydronephrosis. Normal caliber ureters. No evidence of contrast extravasation from the ureters or bladder, with limitations as described. 3. No evidence of metastatic disease in the abdomen or pelvis. 4. Aortic atherosclerosis.   On 08/17/3016 she underwent Exam under anesthesia, vaginoscopy, cystoscopy, cystogram with interpretation of fluoroscopy less than 30 minutes, bilateral retrograde pyelogram, and right ureteral stent placement.   Intraoperative findings: No obvious ureterovaginal vesicovaginal fistula appreciated. Mild right hydroureteronephrosis down to level of the distal ureter with slight medial deviation without filling defects. Slight mucopurulent drainage from left apical vaginal cuff, specimen cultured.  Culture revealed bateroides fragilis and she was started on a 2 week course of Flagyl iniated on 08/24/2016.  Since that time she continues to do well. She presents today to discuss pathology findings and treatment plan. She is due to have the stent removed on 09/22/2016.   Pathology DIAGNOSIS:  A. SENTINEL LYMPH NODE, RIGHT MID OBTURATOR; EXCISION:  - NO TUMOR SEEN IN ONE LYMPH NODE (0/1).   B. SENTINEL LYMPH NODE, RIGHT EXTERNAL ILIAC; EXCISION:  - NO TUMOR SEEN IN ONE LYMPH NODE (0/1).   C. SENTINEL LYMPH NODE, LEFT EXTERNAL ILIAC; EXCISION:  - NO TUMOR SEEN IN ONE LYMPH NODE (0/1).   D. UTERUS  WITH CERVIX, BILATERAL FALLOPIAN TUBES AND OVARIES;  HYSTERECTOMY AND BILATERAL  SALPINGO-OOPHORECTOMY:  - SEROUS CARCINOMA, 4.5 CM(SEE NOTE).  - LYMPHOVASCULAR INVASION PRESENT.  - NO TUMOR SEEN IN BILATERAL OVARIES AND FALLOPIAN TUBES.    ENDOMETRIUM:  Procedure: Hysterectomy with bilateral salpingo-oophorectomy  Histologic Type: Serous carcinoma  Myometrial Invasion: Present       Depth of invasion (millimeters): 13 mm  Myometrial thickness (millimeters): 14 mm       Percentage of myometrial invasion: 93%  Uterine Serosa Involvement: Not identified  Cervical Stromal Involvement: Not identified    BIOMARKER REPORTING TEMPLATE:  p53 Expression: Abnormal strong diffuse overexpression (>90%)  ER/PR negative.  MS stable  Gynecologic Oncology History Jaime Hall is a pleasant G26P2 female who is seen in consultation from [name] and Garwin Brothers for grade 1 endometrial cancer. See prior note for complete details.   Endometrial biopsy done 07/15/16 showed grade 1 endometrial cancer. Surgical management was recommended.   History is also significant for h/o Hodgkin's Lymphoma 2011 treated successfully at [facility] with chemo and RT.   Bladder sling for SUI 2009.  Benign cervical polyp removed 5/16. Has some leg edema due to chronic venous insufficiency.  Had angioplasty and cardiac stents in 2013 and was on Plavix for three years, now just ASA.  Surgical management was recommended.   Problem List: Patient Active Problem List   Diagnosis Date Noted  . 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 SIRS (systemic inflammatory response syndrome) ([place]) 08/05/2016  . Endometrial cancer ([place])   . Pelvic adhesive disease   . Lymphedema 07/20/2016  . Chronic venous insufficiency 07/20/2016  . PAD (peripheral artery disease) ([place]) 07/20/2016  . Postmenopausal bleeding 07/14/2016  . Influenza 07/14/2016  . Class 1 obesity due to excess calories without serious comorbidity with body mass index (BMI) of 34.0 to 34.9 in adult 05/27/2016  . Chronic cough 12/19/2015  .  Pre-syncope 12/19/2015  . Hx of colonic polyps   . Benign neoplasm of sigmoid colon   . Dysphagia, pharyngoesophageal phase 10/01/2014  . Prolapsed, uterovaginal, incomplete 06/24/2014  . Routine general medical examination at a health care facility 12/30/2012  . Obesity (BMI 30-39.9) 12/30/2012  . Hx of multiple pulmonary nodules 12/28/2012  . Plantar fasciitis of right foot 12/28/2012  . 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 Edema of both legs 09/12/2012  . Hip pain, bilateral 09/12/2012  . History of Hodgkin's lymphoma 09/12/2012  . Coronary artery disease   . Hyperlipidemia   . Chest pain on exertion 02/29/2012    Past Medical History: Past Medical History:  Diagnosis Date  . Anxiety   . Cervicalgia   . Coronary artery disease    a. 02/2012 Stress echo: severe anterior wall ischemia;  b. 02/2012 Cath/PCI: LAD 95p (3.0 x 15 Xience EX DES), D1 90ost (PTCA - bifurcational dzs), EF 45% with anterior HK;  b. 02/2013 Ex MV: fixed anterior defect w/ minor reversibility, nl EF-->Med Rx.  . Endometrial cancer ([place])    a. 07/2016 s/p robotic hysterectomy, BSO w/ washings, sentinel node inj, mapping, bx, adhesiolysis.  . Essential hypertension, benign   . Fibrocystic breast disease   . GERD (gastroesophageal reflux disease)   . Gestational hypertension   . Heart murmur   . History of anemia   . History of blood transfusion   . Hodgkin's lymphoma ([place]) 2011   a. s/p radiation and chemo therapy  . Osteoarthritis   . Polycystic ovarian disease     Past Surgical  History: Past Surgical History:  Procedure Laterality Date  . ABDOMINAL HYSTERECTOMY    . bladder sling    . CARDIAC CATHETERIZATION  02/2012   [facility] 1 stent place  . CERVICAL POLYPECTOMY    . CHOLECYSTECTOMY  1982  . COLONOSCOPY WITH PROPOFOL N/A 02/05/2015   Procedure: COLONOSCOPY WITH PROPOFOL;  Surgeon: Lucilla Lame, MD;  Location: [facility] ENDOSCOPY;  Service: Endoscopy;  Laterality: N/A;  . CORONARY  ANGIOPLASTY  02/2012   left/right s/p balloon  . CYSTOGRAM N/A 08/17/2016   Procedure: CYSTOGRAM;  Surgeon: Hollice Espy, MD;  Location: [facility] ORS;  Service: Urology;  Laterality: N/A;  . CYSTOSCOPY N/A 08/17/2016   Procedure: CYSTOSCOPY EXAM UNDER ANESTHESIA;  Surgeon: Hollice Espy, MD;  Location: [facility] ORS;  Service: Urology;  Laterality: N/A;  . CYSTOSCOPY W/ RETROGRADES Bilateral 08/17/2016   Procedure: CYSTOSCOPY WITH RETROGRADE PYELOGRAM;  Surgeon: Hollice Espy, MD;  Location: [facility] ORS;  Service: Urology;  Laterality: Bilateral;  . CYSTOSCOPY WITH STENT PLACEMENT Right 08/17/2016   Procedure: CYSTOSCOPY WITH STENT PLACEMENT;  Surgeon: Hollice Espy, MD;  Location: [facility] ORS;  Service: Urology;  Laterality: Right;  . heart stent'  2013  . LYMPH NODE BIOPSY  2011   diagnosis of hodgkins lymphoma  . PELVIC LYMPH NODE DISSECTION N/A 07/29/2016   Procedure: PELVIC/AORTIC LYMPH NODE SAMPLING;  Surgeon: [name], MD;  Location: [facility] ORS;  Service: Gynecology;  Laterality: N/A;  . ROBOTIC ASSISTED TOTAL HYSTERECTOMY WITH BILATERAL SALPINGO OOPHERECTOMY N/A 07/29/2016   Procedure: ROBOTIC ASSISTED TOTAL HYSTERECTOMY WITH BILATERAL SALPINGO OOPHORECTOMY;  Surgeon: [name], MD;  Location: [facility] ORS;  Service: Gynecology;  Laterality: N/A;  . SENTINEL NODE BIOPSY N/A 07/29/2016   Procedure: SENTINEL NODE BIOPSY;  Surgeon: [name], MD;  Location: [facility] ORS;  Service: Gynecology;  Laterality: N/A;  . transobturator sling N/A 2009   [place]    Family History: Family History  Problem Relation Age of Onset  . ALS Father   . Polymyositis Father   . Diabetes Brother   . Cancer Maternal Aunt     breast  . Breast cancer Maternal Aunt     30's  . Stroke Maternal Grandmother   . Cancer Maternal Grandfather     prostate  . Stroke Maternal Grandfather     Social History: Works as Dealer Social History   Social History  . Marital status:  Widowed    Spouse name: N/A  . Number of children: N/A  . Years of education: N/A   Occupational History  . Not on file.   Social History Main Topics  . Smoking status: Former Smoker    Packs/day: 1.00    Years: 30.00    Types: Cigarettes    Quit date: 07/24/2002  . Smokeless tobacco: Never Used     Comment: quit smoking in 2000  . Alcohol use Yes     Comment: occasional, 1-2 drinks per month  . Drug use: No  . Sexual activity: No   Other Topics Concern  . Not on file   Social History Narrative  . No narrative on file    Allergies: Allergies  Allergen Reactions  . Adhesive [Tape] Other (See Comments)    Burns the skin  . Z-Pak [Azithromycin] Itching  . Antifungal [Miconazole Nitrate] Rash  . Sulfa Antibiotics Nausea And Vomiting and Rash    N/T    Current Medications: Current Outpatient Prescriptions  Medication Sig Dispense Refill  . acetaminophen (TYLENOL) 500 MG tablet Take 1,000  mg by mouth every 6 (six) hours as needed.    Marland Kitchen aspirin EC 81 MG tablet Take 81 mg by mouth at bedtime.    Marland Kitchen atorvastatin (LIPITOR) 10 MG tablet TAKE ONE TABLET BY MOUTH ONCE DAILY (Patient taking differently: Take 10 mg by mouth at bedtime. ) 90 tablet 3  . BIOTIN 5000 PO Take 1 Dose by mouth daily.    . Cholecalciferol (VITAMIN D3) 2000 units TABS Take 2,000 Units by mouth 3 (three) times a week.    . diazepam (VALIUM) 5 MG tablet Take 1 tablet (5 mg total) by mouth every 12 (twelve) hours as needed for anxiety. 30 tablet 1  . esomeprazole (NEXIUM) 20 MG capsule Take 20 mg by mouth daily before breakfast.     . metoprolol tartrate (LOPRESSOR) 25 MG tablet TAKE ONE TABLET BY MOUTH TWICE DAILY 180 tablet 2  . metroNIDAZOLE (FLAGYL) 500 MG tablet Take 1 tablet (500 mg total) by mouth 3 (three) times daily. 42 tablet 0  . Turmeric 500 MG CAPS Take 500 mg by mouth 2 (two) times daily.    . vitamin B-12 (CYANOCOBALAMIN) 1000 MCG tablet Take 1,000 mcg by mouth daily.    Marland Kitchen ibuprofen  (ADVIL,MOTRIN) 200 MG tablet Take 200 mg by mouth every 6 (six) hours as needed.    . naproxen sodium (ANAPROX) 220 MG tablet Take 440 mg by mouth 2 (two) times daily as needed (for pain/headache.).    Marland Kitchen nitroGLYCERIN (NITROSTAT) 0.4 MG SL tablet Place 1 tablet (0.4 mg total) under the tongue every 5 (five) minutes as needed for chest pain. (Patient not taking: Reported on 08/17/2016) 25 tablet 3  . oxybutynin (DITROPAN) 5 MG tablet Take 1 tablet (5 mg total) by mouth every 8 (eight) hours as needed for bladder spasms. (Patient not taking: Reported on 09/02/2016) 30 tablet 0  . oxyCODONE (ROXICODONE) 5 MG immediate release tablet Take 1 tablet (5 mg total) by mouth every 4 (four) hours as needed for severe pain. (Patient not taking: Reported on 08/17/2016) 21 tablet 0  . tamsulosin (FLOMAX) 0.4 MG CAPS capsule Take 1 capsule (0.4 mg total) by mouth daily. (Patient not taking: Reported on 09/02/2016) 30 capsule 0   No current facility-administered medications for this visit.     Review of Systems General: fatigue  HEENT: concerned about cervical node swelling  Lungs: no complaints  Cardiac: no complaints  GI: no complaints  GU: as noted in HPI; decreased vaginal discharge and spotting  Musculoskeletal: no complaints  Extremities: no complaints  Skin: no complaints  Neuro: depression  Endocrine: no complaints  Psych: no complaints       Objective:  Physical Examination:  BP 121/77   Pulse 63   Temp 97.6 F (36.4 C) (Tympanic)   Resp 18   Ht '5\' 1"'  (1.549 m)   Wt 172 lb 12.8 oz (78.4 kg)   BMI 32.65 kg/m    ECOG Performance Status: 1 - Symptomatic but completely ambulatory  General appearance: alert, cooperative and appears stated age HEENT:PERRLA, extra ocular movement intact and sclera clear, anicteric Lymph node survey: non-palpable, axillary, inguinal, supraclavicular Cardiovascular: regular rate and rhythm Respiratory: normal air entry, lungs clear to auscultation and no  rales Abdomen: soft, non-tender, without masses or organomegaly, no hernias and well healed incision Back: inspection of back is normal Extremities: mild venous insufficiency changes with superficial varices. Skin exam - normal coloration and turgor, no rashes, no suspicious skin lesions noted. Neurological exam reveals alert, oriented, normal  speech, no focal findings or movement disorder noted.  Pelvic: exam chaperoned by nurse;  Vulva: normal appearing vulva with no masses, tenderness or lesions; Vagina: normal vagina; cuff intact; no drainage or discharge appreciated. Sutures all present; cuff probed with Q-tube and no defects appreciated.  Adnexa/Uterus/Cervix:absent. Fullness present at the cuff which is consistent with known abscess; nontender.  Rectal: normal rectal, confirmatory    Assessment:  Jaime Hall is a 67 y.o. 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 Severe pelvic adhesive disease.   Medical co-morbidities complicating care: 30 pack year former smoker s/p coronary angioplasty and stents 2013 and takes baby ASA.  Plan:   Problem List Items Addressed This Visit      Genitourinary   Endometrial cancer ([place]) - Primary   Relevant Medications   BIOTIN 5000 PO   Other Relevant Orders   CT Abdomen Pelvis W Contrast   CBC with Differential/Platelet (Completed)   Ambulatory referral to Oncology   Creatinine, serum (Completed)   Ureterovaginal fistula   Relevant Medications   BIOTIN 5000 PO   Other Relevant Orders   CT Abdomen Pelvis W Contrast   CBC with Differential/Platelet (Completed)   Ambulatory referral to Oncology   Creatinine, serum (Completed)     We discussed options for  management for stage 1 serous HIR endometrial cancer including adjuvant chemotherapy vs observation. I discussed that she has a 20-25% chance of recurrence.  I have not recommended radiation given the pelvic vaginal cuff abscess and possible ureterovaginal fistula. She will be seeing Dr. Janese Banks in consultation to review the risk benefit ratio of adjuvant chemotherapy. I have also submitted a request to pathology for Her2neu assessment given serous histology.   With regard to the infection I agree with continued 2 week course of Flagyl. Repeat CT scan in April.   I will obtain a CBC today to assess her WBC and for anemia.   I discussed CT findings and concern for colovaginal fistula. Clinically she does not have concerning symptoms for this diagnosis. I recommended follow up in April 11th to review CT scan findings to assess the abscess and the concern for colovaginal fistula.    The patient's diagnosis, an outline of the further diagnostic and laboratory studies which will be required, the recommendation, and alternatives were discussed.  All questions were answered to the patient's satisfaction.  A total of 60 minutes were spent with the patient/family today; 30% was spent in education, counseling and coordination of care for endometrial cancer.    [name], MD  CC:  [name], MD [address] [place], [place] [address] [phone]  Larey Days, MD  Hollice Espy, MD

## 2016-09-02 NOTE — Progress Notes (Signed)
  Oncology Nurse Navigator Documentation Chaperonoed pelvic exam. Orders placed for follow up CT and to see Dr. [name]. 4/11 Navigator Location: [facility] (09/02/16 1000)   )Navigator Encounter Type: Follow-up Appt (09/02/16 1000)                     Patient Visit Type: GynOnc (09/02/16 1000)                              Time Spent with Patient: 30 (09/02/16 1000)

## 2016-09-02 NOTE — Progress Notes (Signed)
Pt here post surgery. Pt to get info about cancer and chemo info.

## 2016-09-09 ENCOUNTER — Other Ambulatory Visit: Payer: PPO | Admitting: Urology

## 2016-09-09 DIAGNOSIS — C541 Malignant neoplasm of endometrium: Secondary | ICD-10-CM | POA: Diagnosis not present

## 2016-09-10 ENCOUNTER — Ambulatory Visit
Admission: RE | Admit: 2016-09-10 | Discharge: 2016-09-10 | Disposition: A | Discharge status: Home / Self Care | Payer: PPO | Source: Ambulatory Visit | Attending: Obstetrics and Gynecology | Admitting: Obstetrics and Gynecology

## 2016-09-10 ENCOUNTER — Ambulatory Visit: Payer: PPO

## 2016-09-10 DIAGNOSIS — C541 Malignant neoplasm of endometrium: Secondary | ICD-10-CM

## 2016-09-10 DIAGNOSIS — N281 Cyst of kidney, acquired: Secondary | ICD-10-CM | POA: Diagnosis not present

## 2016-09-10 DIAGNOSIS — I700 Atherosclerosis of aorta: Secondary | ICD-10-CM | POA: Diagnosis not present

## 2016-09-10 DIAGNOSIS — N821 Other female urinary-genital tract fistulae: Secondary | ICD-10-CM

## 2016-09-10 MED ORDER — IOPAMIDOL (ISOVUE-300) INJECTION 61%
100.0000 mL | Freq: Once | INTRAVENOUS | Status: AC | PRN
  Administered 2016-09-10: 100 mL via INTRAVENOUS

## 2016-09-17 ENCOUNTER — Encounter (INDEPENDENT_AMBULATORY_CARE_PROVIDER_SITE_OTHER): Payer: PPO

## 2016-09-17 ENCOUNTER — Inpatient Hospital Stay: Payer: PPO | Attending: Oncology | Admitting: Oncology

## 2016-09-17 ENCOUNTER — Encounter: Payer: Self-pay | Admitting: Oncology

## 2016-09-17 VITALS — BP 116/66 | HR 75 | Temp 95.7°F | Resp 18 | Ht 62.99 in | Wt 177.1 lb

## 2016-09-17 DIAGNOSIS — E669 Obesity, unspecified: Secondary | ICD-10-CM | POA: Diagnosis not present

## 2016-09-17 DIAGNOSIS — R531 Weakness: Secondary | ICD-10-CM

## 2016-09-17 DIAGNOSIS — G893 Neoplasm related pain (acute) (chronic): Secondary | ICD-10-CM | POA: Diagnosis not present

## 2016-09-17 DIAGNOSIS — R05 Cough: Secondary | ICD-10-CM | POA: Diagnosis not present

## 2016-09-17 DIAGNOSIS — Z5111 Encounter for antineoplastic chemotherapy: Secondary | ICD-10-CM | POA: Insufficient documentation

## 2016-09-17 DIAGNOSIS — M25552 Pain in left hip: Secondary | ICD-10-CM

## 2016-09-17 DIAGNOSIS — G4700 Insomnia, unspecified: Secondary | ICD-10-CM | POA: Insufficient documentation

## 2016-09-17 DIAGNOSIS — Z7689 Persons encountering health services in other specified circumstances: Secondary | ICD-10-CM | POA: Insufficient documentation

## 2016-09-17 DIAGNOSIS — Z79899 Other long term (current) drug therapy: Secondary | ICD-10-CM

## 2016-09-17 DIAGNOSIS — Z87891 Personal history of nicotine dependence: Secondary | ICD-10-CM

## 2016-09-17 DIAGNOSIS — C541 Malignant neoplasm of endometrium: Secondary | ICD-10-CM

## 2016-09-17 DIAGNOSIS — I890 Lymphedema, not elsewhere classified: Secondary | ICD-10-CM | POA: Diagnosis not present

## 2016-09-17 DIAGNOSIS — R918 Other nonspecific abnormal finding of lung field: Secondary | ICD-10-CM | POA: Diagnosis not present

## 2016-09-17 DIAGNOSIS — R079 Chest pain, unspecified: Secondary | ICD-10-CM | POA: Diagnosis not present

## 2016-09-17 DIAGNOSIS — M722 Plantar fascial fibromatosis: Secondary | ICD-10-CM | POA: Diagnosis not present

## 2016-09-17 DIAGNOSIS — I2510 Atherosclerotic heart disease of native coronary artery without angina pectoris: Secondary | ICD-10-CM | POA: Diagnosis not present

## 2016-09-17 DIAGNOSIS — I739 Peripheral vascular disease, unspecified: Secondary | ICD-10-CM

## 2016-09-17 DIAGNOSIS — M25551 Pain in right hip: Secondary | ICD-10-CM | POA: Diagnosis not present

## 2016-09-17 DIAGNOSIS — G629 Polyneuropathy, unspecified: Secondary | ICD-10-CM

## 2016-09-17 DIAGNOSIS — Z86010 Personal history of colonic polyps: Secondary | ICD-10-CM

## 2016-09-17 DIAGNOSIS — R5383 Other fatigue: Secondary | ICD-10-CM

## 2016-09-17 DIAGNOSIS — Z9221 Personal history of antineoplastic chemotherapy: Secondary | ICD-10-CM

## 2016-09-17 DIAGNOSIS — Z90710 Acquired absence of both cervix and uterus: Secondary | ICD-10-CM

## 2016-09-17 DIAGNOSIS — N736 Female pelvic peritoneal adhesions (postinfective): Secondary | ICD-10-CM | POA: Diagnosis not present

## 2016-09-17 DIAGNOSIS — E785 Hyperlipidemia, unspecified: Secondary | ICD-10-CM | POA: Diagnosis not present

## 2016-09-17 DIAGNOSIS — I700 Atherosclerosis of aorta: Secondary | ICD-10-CM

## 2016-09-17 DIAGNOSIS — R1310 Dysphagia, unspecified: Secondary | ICD-10-CM | POA: Diagnosis not present

## 2016-09-17 DIAGNOSIS — I10 Essential (primary) hypertension: Secondary | ICD-10-CM

## 2016-09-17 DIAGNOSIS — K5900 Constipation, unspecified: Secondary | ICD-10-CM | POA: Diagnosis not present

## 2016-09-17 DIAGNOSIS — F419 Anxiety disorder, unspecified: Secondary | ICD-10-CM

## 2016-09-17 DIAGNOSIS — Z7982 Long term (current) use of aspirin: Secondary | ICD-10-CM

## 2016-09-17 DIAGNOSIS — Z08 Encounter for follow-up examination after completed treatment for malignant neoplasm: Secondary | ICD-10-CM | POA: Insufficient documentation

## 2016-09-17 DIAGNOSIS — M1990 Unspecified osteoarthritis, unspecified site: Secondary | ICD-10-CM

## 2016-09-17 DIAGNOSIS — Z7189 Other specified counseling: Secondary | ICD-10-CM

## 2016-09-17 DIAGNOSIS — Z8571 Personal history of Hodgkin lymphoma: Secondary | ICD-10-CM

## 2016-09-17 DIAGNOSIS — K219 Gastro-esophageal reflux disease without esophagitis: Secondary | ICD-10-CM

## 2016-09-17 DIAGNOSIS — Z90722 Acquired absence of ovaries, bilateral: Secondary | ICD-10-CM

## 2016-09-17 NOTE — Progress Notes (Signed)
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 [name]  Date of visit: 09/17/16   History of presenting illness- 1. patient is a 66 year old female who was seen by Dr. [name] for grade 1 endometrial cancerwhen she presented with postmenopausal bleeding in February 2018. She underwent robotic hysterectomy and bilateral salpingo-oophorectomy with washings and sentinel lymph node injection and mapping and biopsy on 07/29/2016. She was found to have extensive pelvic adhesive disease and underwent elevated pelvic adhesiolysis including enteric and ovariolysis. The cul de sac was obliterated and adherent to posterior cervix.-Was negative.  2. Pathology showed: Pathology DIAGNOSIS:  A. SENTINEL LYMPH NODE, RIGHT MID OBTURATOR; EXCISION:  - NO TUMOR SEEN IN ONE LYMPH NODE (0/1).   B. SENTINEL LYMPH NODE, RIGHT EXTERNAL ILIAC; EXCISION:  - NO TUMOR SEEN IN ONE LYMPH NODE (0/1).   C. SENTINEL LYMPH NODE, LEFT EXTERNAL ILIAC; EXCISION:  - NO TUMOR SEEN IN ONE LYMPH NODE (0/1).   D. UTERUS WITH CERVIX, BILATERAL FALLOPIAN TUBES AND OVARIES;  HYSTERECTOMY AND BILATERAL SALPINGO-OOPHORECTOMY:  - SEROUS CARCINOMA, 4.5 CM(SEE NOTE).  - LYMPHOVASCULAR INVASION PRESENT.  - NO TUMOR SEEN IN BILATERAL OVARIES AND FALLOPIAN TUBES.    ENDOMETRIUM:  Procedure: Hysterectomy with bilateral salpingo-oophorectomy  Histologic Type: Serous carcinoma  Myometrial Invasion: Present       Depth of invasion (millimeters): 13 mm  Myometrial thickness (millimeters): 14 mm       Percentage of  myometrial invasion: 93%  Uterine Serosa Involvement: Not identified  Cervical Stromal Involvement: Not identified   BIOMARKER REPORTING TEMPLATE:  p53 Expression: Abnormal strong diffuse overexpression (>90%)  ER/PR negative. Her2 pending MS stable  3. She presented with fever on 08/05/2016 and CT scan showed 1.8 x 3.8 cm complex fluid collection containing pockets of air within somewhat organized walls within the pelvis and the hysterectomy bed most consistent with a developing abscess. There is a superior extension of the loculated fluid along the left lateral pelvic wall anterior to the left psoas muscle measuring 2 x 4 cm and another fluid collection along the anterior surface of the left psoas muscle measuring 1.8 x 3.6 cm. Blood cultures were positive for bacteria growing his fragilis. Urine cultures were negative. Patient was treated with IV antibiotics and discharged on Augmentin.  4.  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 [name] from urology on 08/14/2016. No leak was seen on CT urogram.  5. On 08/17/2016 patient underwentExam under anesthesia, vaginoscopy, cystoscopy, cystogram with interpretation of fluoroscopy less than 30 minutes, bilateral retrograde pyelogram, and right ureteral stent placement. Intraoperative findings: No obvious ureterovaginal vesicovaginal fistula appreciated. Mild right hydroureteronephrosis down to level of the distal ureter with slight medial deviation without filling defects. Slight mucopurulent drainage from left apical vaginal cuff, specimen cultured. Culture revealed bateroides fragilis and she was started on a 2 week course of Flagyl iniated on 08/24/2016 which she has now completed  6. Repeat CT abdomen on IMPRESSION: 1. Interval resolution of abscess involving the vaginal cuff. 2. The proximal end of the right  ureteral stent is coiled in the proximal right ureter. No hydronephrosis. 3. No evidence of metastatic disease or other significant changes. 4. Aortic atherosclerosis   Patient has been recovering slowly following her complicated postoperative course. Currently she reports no fevers or vaginal leakage. Does report some fatigue but denies other complaints. She lives alone and is independent of her ADLs and IADLs. She has a prior history of Hodgkin's lymphoma in 2011 treated with anthracycline-based chemotherapy   ECOG PS- 1  Pain scale- 0   Review of systems- Review of Systems  Constitutional: Positive for malaise/fatigue. Negative for chills, fever and weight loss.  HENT: Negative for congestion, ear discharge and nosebleeds.   Eyes: Negative for blurred vision.  Respiratory: Negative for cough, hemoptysis, sputum production, shortness of breath and wheezing.   Cardiovascular: Negative for chest pain, palpitations, orthopnea and claudication.  Gastrointestinal: Negative for abdominal pain, blood in stool, constipation, diarrhea, heartburn, melena, nausea and vomiting.  Genitourinary: Negative for dysuria, flank pain, frequency, hematuria and urgency.  Musculoskeletal: Negative for back pain, joint pain and myalgias.  Skin: Negative for rash.  Neurological: Negative for dizziness, tingling, focal weakness, seizures, weakness and headaches.  Endo/Heme/Allergies: Does not bruise/bleed easily.  Psychiatric/Behavioral: Negative for depression and suicidal ideas. The patient does not have insomnia.     Allergies  Allergen Reactions  . Adhesive [Tape] Other (See Comments)    Burns the skin  . Z-Pak [Azithromycin] Itching  . Antifungal [Miconazole Nitrate] Rash  . Sulfa Antibiotics Nausea And Vomiting and Rash    N/T    Patient Active Problem List   Diagnosis Date Noted  . 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 SIRS (systemic inflammatory response syndrome) ([place]) 08/05/2016  .  Endometrial cancer ([place])   . Pelvic adhesive disease   . Lymphedema 07/20/2016  . Chronic venous insufficiency 07/20/2016  . PAD (peripheral artery disease) ([place]) 07/20/2016  . Postmenopausal bleeding 07/14/2016  . Influenza 07/14/2016  . Class 1 obesity due to excess calories without serious comorbidity with body mass index (BMI) of 34.0 to 34.9 in adult 05/27/2016  . Chronic cough 12/19/2015  . Pre-syncope 12/19/2015  . Hx of colonic polyps   . Benign neoplasm of sigmoid colon   . Dysphagia, pharyngoesophageal phase 10/01/2014  . Prolapsed, uterovaginal, incomplete 06/24/2014  . Routine general medical examination at a health care facility 12/30/2012  . Obesity (BMI 30-39.9) 12/30/2012  . Hx of multiple pulmonary nodules 12/28/2012  . Plantar fasciitis of right foot 12/28/2012  . 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 Edema of both legs 09/12/2012  . Hip pain, bilateral 09/12/2012  . History of Hodgkin's lymphoma 09/12/2012  . Coronary artery disease   . Hyperlipidemia   . Chest pain on exertion 02/29/2012     Past Medical History:  Diagnosis Date  . Anxiety   . Cervicalgia   . Coronary artery disease    a. 02/2012 Stress echo: severe anterior wall ischemia;  b. 02/2012 Cath/PCI: LAD 95p (3.0 x 15 Xience EX DES), D1 90ost (PTCA - bifurcational dzs), EF 45% with anterior HK;  b. 02/2013 Ex MV: fixed anterior defect w/ minor reversibility, nl EF-->Med Rx.  . Endometrial cancer ([place])    a. 07/2016 s/p robotic hysterectomy, BSO w/ washings, sentinel node inj, mapping, bx, adhesiolysis.  . Essential hypertension, benign   . Fibrocystic breast disease   . GERD (gastroesophageal reflux disease)   . Gestational hypertension   . Heart murmur   . History of anemia   .  History of blood transfusion   . Hodgkin's lymphoma ([place]) 2011   a. s/p radiation and chemo therapy  . Osteoarthritis   . Polycystic ovarian disease      Past Surgical History:  Procedure  Laterality Date  . ABDOMINAL HYSTERECTOMY    . bladder sling    . CARDIAC CATHETERIZATION  02/2012   [facility] 1 stent place  . CERVICAL POLYPECTOMY    . CHOLECYSTECTOMY  1982  . COLONOSCOPY WITH PROPOFOL N/A 02/05/2015   Procedure: COLONOSCOPY WITH PROPOFOL;  Surgeon: Lucilla Lame, MD;  Location: [facility] ENDOSCOPY;  Service: Endoscopy;  Laterality: N/A;  . CORONARY ANGIOPLASTY  02/2012   left/right s/p balloon  . CYSTOGRAM N/A 08/17/2016   Procedure: CYSTOGRAM;  Surgeon: Hollice Espy, MD;  Location: [facility] ORS;  Service: Urology;  Laterality: N/A;  . CYSTOSCOPY N/A 08/17/2016   Procedure: CYSTOSCOPY EXAM UNDER ANESTHESIA;  Surgeon: Hollice Espy, MD;  Location: [facility] ORS;  Service: Urology;  Laterality: N/A;  . CYSTOSCOPY W/ RETROGRADES Bilateral 08/17/2016   Procedure: CYSTOSCOPY WITH RETROGRADE PYELOGRAM;  Surgeon: Hollice Espy, MD;  Location: [facility] ORS;  Service: Urology;  Laterality: Bilateral;  . CYSTOSCOPY WITH STENT PLACEMENT Right 08/17/2016   Procedure: CYSTOSCOPY WITH STENT PLACEMENT;  Surgeon: Hollice Espy, MD;  Location: [facility] ORS;  Service: Urology;  Laterality: Right;  . heart stent'  2013  . LYMPH NODE BIOPSY  2011   diagnosis of hodgkins lymphoma  . PELVIC LYMPH NODE DISSECTION N/A 07/29/2016   Procedure: PELVIC/AORTIC LYMPH NODE SAMPLING;  Surgeon: [name], MD;  Location: [facility] ORS;  Service: Gynecology;  Laterality: N/A;  . ROBOTIC ASSISTED TOTAL HYSTERECTOMY WITH BILATERAL SALPINGO OOPHERECTOMY N/A 07/29/2016   Procedure: ROBOTIC ASSISTED TOTAL HYSTERECTOMY WITH BILATERAL SALPINGO OOPHORECTOMY;  Surgeon: [name], MD;  Location: [facility] ORS;  Service: Gynecology;  Laterality: N/A;  . SENTINEL NODE BIOPSY N/A 07/29/2016   Procedure: SENTINEL NODE BIOPSY;  Surgeon: [name], MD;  Location: [facility] ORS;  Service: Gynecology;  Laterality: N/A;  . transobturator sling N/A 2009   [place] History   Social History  . Marital status: Widowed      Spouse name: N/A  . Number of children: N/A  . Years of education: N/A   Occupational History  . Not on file.   Social History Main Topics  . Smoking status: Former Smoker    Packs/day: 1.00    Years: 30.00    Types: Cigarettes    Quit date: 07/24/2002  . Smokeless tobacco: Never Used     Comment: quit smoking in 2000  . Alcohol use Yes     Comment: occasional, 1-2 drinks per month  . Drug use: No  . Sexual activity: No   Other Topics Concern  . Not on file   Social History Narrative  . No narrative on file     Family History  Problem Relation Age of Onset  . ALS Father   . Polymyositis Father   . Diabetes Brother   . Cancer Maternal Aunt     breast  . Breast cancer Maternal Aunt     30's  . Stroke Maternal Grandmother   . Cancer Maternal Grandfather     prostate  . Stroke Maternal Grandfather      Current Outpatient Prescriptions:  .  acetaminophen (TYLENOL) 500 MG tablet, Take 1,000 mg by mouth every 6 (six) hours as needed., Disp: , Rfl:  .  aspirin EC 81 MG tablet, Take 81 mg by  mouth at bedtime., Disp: , Rfl:  .  atorvastatin (LIPITOR) 10 MG tablet, TAKE ONE TABLET BY MOUTH ONCE DAILY (Patient taking differently: Take 10 mg by mouth at bedtime. ), Disp: 90 tablet, Rfl: 3 .  BIOTIN 5000 PO, Take 1 Dose by mouth daily., Disp: , Rfl:  .  Cholecalciferol (VITAMIN D3) 2000 units TABS, Take 2,000 Units by mouth 3 (three) times a week., Disp: , Rfl:  .  diazepam (VALIUM) 5 MG tablet, Take 1 tablet (5 mg total) by mouth every 12 (twelve) hours as needed for anxiety., Disp: 30 tablet, Rfl: 1 .  esomeprazole (NEXIUM) 20 MG capsule, Take 20 mg by mouth daily before breakfast. , Disp: , Rfl:  .  ibuprofen (ADVIL,MOTRIN) 200 MG tablet, Take 200 mg by mouth every 6 (six) hours as needed., Disp: , Rfl:  .  metoprolol tartrate (LOPRESSOR) 25 MG tablet, TAKE ONE TABLET BY MOUTH TWICE DAILY, Disp: 180 tablet, Rfl: 2 .  metroNIDAZOLE (FLAGYL) 500 MG tablet, Take 1 tablet (500  mg total) by mouth 3 (three) times daily., Disp: 42 tablet, Rfl: 0 .  naproxen sodium (ANAPROX) 220 MG tablet, Take 440 mg by mouth 2 (two) times daily as needed (for pain/headache.)., Disp: , Rfl:  .  nitroGLYCERIN (NITROSTAT) 0.4 MG SL tablet, Place 1 tablet (0.4 mg total) under the tongue every 5 (five) minutes as needed for chest pain. (Patient not taking: Reported on 08/17/2016), Disp: 25 tablet, Rfl: 3 .  oxybutynin (DITROPAN) 5 MG tablet, Take 1 tablet (5 mg total) by mouth every 8 (eight) hours as needed for bladder spasms. (Patient not taking: Reported on 09/02/2016), Disp: 30 tablet, Rfl: 0 .  oxyCODONE (ROXICODONE) 5 MG immediate release tablet, Take 1 tablet (5 mg total) by mouth every 4 (four) hours as needed for severe pain. (Patient not taking: Reported on 08/17/2016), Disp: 21 tablet, Rfl: 0 .  tamsulosin (FLOMAX) 0.4 MG CAPS capsule, Take 1 capsule (0.4 mg total) by mouth daily. (Patient not taking: Reported on 09/02/2016), Disp: 30 capsule, Rfl: 0 .  Turmeric 500 MG CAPS, Take 500 mg by mouth 2 (two) times daily., Disp: , Rfl:  .  vitamin B-12 (CYANOCOBALAMIN) 1000 MCG tablet, Take 1,000 mcg by mouth daily., Disp: , Rfl:    Physical exam:  Vitals:   09/17/16 1333  BP: 116/66  Pulse: 75  Resp: 18  Temp: (!) 95.7 F (35.4 C)  TempSrc: Tympanic  Weight: 177 lb 1.6 oz (80.3 kg)  Height: 5' 2.99" (1.6 m)   Physical Exam  Constitutional: She is oriented to person, place, and time and well-developed, well-nourished, and in no distress.  HENT:  Head: Normocephalic and atraumatic.  Eyes: EOM are normal. Pupils are equal, round, and reactive to light.  Neck: Normal range of motion.  Cardiovascular: Normal rate, regular rhythm and normal heart sounds.   Pulmonary/Chest: Effort normal and breath sounds normal.  Abdominal: Soft. Bowel sounds are normal.  Neurological: She is alert and oriented to person, place, and time.  Skin: Skin is warm and dry.       CMP Latest Ref Rng &  Units 09/02/2016  Glucose 65 - 99 mg/dL -  BUN 6 - 20 mg/dL -  Creatinine 0.44 - 1.00 mg/dL 1.06(H)  Sodium 135 - 145 mmol/L -  Potassium 3.5 - 5.1 mmol/L -  Chloride 101 - 111 mmol/L -  CO2 22 - 32 mmol/L -  Calcium 8.9 - 10.3 mg/dL -  Total Protein 6.5 - 8.1 g/dL -  Total Bilirubin 0.3 - 1.2 mg/dL -  Alkaline Phos 38 - 126 U/L -  AST 15 - 41 U/L -  ALT 14 - 54 U/L -   CBC Latest Ref Rng & Units 09/02/2016  WBC 3.6 - 11.0 K/uL 8.7  Hemoglobin 12.0 - 16.0 g/dL 13.7  Hematocrit 35.0 - 47.0 % 40.5  Platelets 150 - 440 K/uL 196    No images are attached to the encounter.  Ct Abdomen Pelvis W Contrast  Result Date: 09/11/2016 CLINICAL DATA:  Endometrial cancer status post total hysterectomy. Hodgkin's lymphoma in 2011. No current complaints. EXAM: CT ABDOMEN AND PELVIS WITH CONTRAST TECHNIQUE: Multidetector CT imaging of the abdomen and pelvis was performed using the standard protocol following bolus administration of intravenous contrast. CONTRAST:  149m ISOVUE-300 IOPAMIDOL (ISOVUE-300) INJECTION 61% COMPARISON:  CT 08/13/2016 and 08/05/2016. FINDINGS: Lower chest: Clear lung bases. No significant pleural or pericardial effusion. Hepatobiliary: No focal hepatic abnormalities are demonstrated. There is stable mild biliary dilatation status post cholecystectomy, within physiologic limits. Pancreas: Unremarkable. No pancreatic ductal dilatation or surrounding inflammatory changes. Spleen: Normal in size without focal abnormality. Adrenals/Urinary Tract: Both adrenal glands appear normal. There are stable low-density renal lesions bilaterally, largest in the lower pole of the right kidney consistent with a simple cyst. There has been interval placement of a right ureteral stent. Superiorly, this stent is coiled in the proximal right ureter. The distal end of the stent is within the bladder. There is no evidence of hydronephrosis or delay in contrast excretion. No bladder lesions are evident. There  is no air within the urinary bladder lumen in this patient with a history of repaired vaginal fistula. Stomach/Bowel: No evidence of bowel wall thickening, distention or surrounding inflammatory change. The appendix appears normal. There is moderate stool throughout the colon. Vascular/Lymphatic: There are no enlarged abdominal or pelvic lymph nodes. Stable aortic and branch vessel atherosclerosis. Reproductive: Hysterectomy. No evidence of adnexal mass. There is no evidence of residual fluid collection, inflammation or mass in the region of the vaginal cuff. Other: No ascites or peritoneal nodularity. Stable postsurgical changes in the anterior abdominal wall. Musculoskeletal: No acute or significant osseous findings. Grade 1 degenerative anterolisthesis at L4-5. IMPRESSION: 1. Interval resolution of abscess involving the vaginal cuff. 2. The proximal end of the right ureteral stent is coiled in the proximal right ureter. No hydronephrosis. 3. No evidence of metastatic disease or other significant changes. 4. Aortic atherosclerosis. Electronically Signed   By: WRichardean SaleM.D.   On: 09/11/2016 08:16    Assessment and plan- Patient is a 66y.o. 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 Patient reports no current infectious symptoms and her recent CBC was within normal limits. Given that patient has serous histology endometrial cancer with greater than 98% myometrial invasion she does fall under high intermediate risk and has about a 66year old 25% recurrence after surgery without any adjuvant chemotherapy.Dr. STheora Gianottiwas concerned about possible uterovaginal fistula and pelvic vaginal cuff abscess and hence did not nd adjuvant radiation which I think is reasonable.   Today I discussed  risks and benefits of chemotherapy. Clearly patient has high risk disease and would benefit from adjuvant chemotherapy.But patient also has severe pelvic adhesive disease as well as had her recent vaginal cuff abscess which was treated with  antibiotics.Presently there is no active signs and symptoms of infection and recent scan also shows resolution of the abscess. I discussed that chemotherapy would be associated with side effects such as nausea, vomiting, risk of low blood counts and infections. Risk of infusion reaction as well as peripheral neuropathy and hair loss associated with Taxol. If there is an underlying fistula which is not yet clinically detected would potentially get complicated during the course of chemotherapy and patient could have severe life threatening infectious complications. Patient understands the pros and cons of doing versus not doing chemotherapy and would like to proceed with adjuvant chemotherapy. I would recommend carboplatin AUC 5 along with Taxol at 175 mg/m IV every 3 weeks for up to 6 cycles. We do not yet have data to stop treatment at 3 cycles. If the patient were to have problems tolerating her chemotherapy, we will stop at that point. I will plan to get port placement and tentatively plan to start chemotherapy in 2 weeks time. I will give neulasta support with chemotherapy.   Total face to face encounter time for this patient visit was 45 min. >50% of the time was  spent in counseling and coordination of care.     Thank you for this kind referral and the opportunity to participate in the care of this patient   Visit Diagnosis 1. Endometrial cancer ([place])   2. Goals of care, counseling/discussion     Dr. Randa Evens, MD, MPH [facility] Pager- [phone] 09/17/2016  1:55 PM

## 2016-09-17 NOTE — Progress Notes (Signed)
Here as new pt to Dr Janese Banks.

## 2016-09-18 ENCOUNTER — Other Ambulatory Visit (INDEPENDENT_AMBULATORY_CARE_PROVIDER_SITE_OTHER): Payer: Self-pay | Admitting: Vascular Surgery

## 2016-09-18 MED ORDER — DEXAMETHASONE 4 MG PO TABS: 8.0000 mg | ORAL_TABLET | Freq: Every day | ORAL | 1 refills | Status: DC

## 2016-09-18 MED ORDER — LORAZEPAM 0.5 MG PO TABS: 0.5000 mg | ORAL_TABLET | Freq: Four times a day (QID) | ORAL | 0 refills | Status: DC | PRN

## 2016-09-18 MED ORDER — LIDOCAINE-PRILOCAINE 2.5-2.5 % EX CREA: TOPICAL_CREAM | CUTANEOUS | 3 refills | Status: DC

## 2016-09-18 MED ORDER — ONDANSETRON HCL 8 MG PO TABS: 8.0000 mg | ORAL_TABLET | Freq: Two times a day (BID) | ORAL | 1 refills | Status: DC | PRN

## 2016-09-18 MED ORDER — PROCHLORPERAZINE MALEATE 10 MG PO TABS: 10.0000 mg | ORAL_TABLET | Freq: Four times a day (QID) | ORAL | 1 refills | Status: DC | PRN

## 2016-09-18 NOTE — Addendum Note (Signed)
Addended by: Randa Evens C on: 09/18/2016 01:58 PM   Modules accepted: Orders

## 2016-09-18 NOTE — Progress Notes (Signed)
START ON PATHWAY REGIMEN - Uterine     A cycle is every 21 days:     Paclitaxel      Carboplatin   **Always confirm dose/schedule in your pharmacy ordering system**    Patient Characteristics: Papillary Serous and Clear Cell Histology, First Line, Medically Operable AJCC T Category: T1b AJCC N Category: N0 AJCC M Category: M0 AJCC 8 Stage Grouping: IB Would you be surprised if this patient died  in the next year? I would be surprised if this patient died in the next year Line of therapy: First Line Patient Status: Medically Operable  Intent of Therapy: Curative Intent, Discussed with Patient

## 2016-09-21 ENCOUNTER — Ambulatory Visit (INDEPENDENT_AMBULATORY_CARE_PROVIDER_SITE_OTHER): Payer: PPO | Admitting: Vascular Surgery

## 2016-09-21 ENCOUNTER — Ambulatory Visit (INDEPENDENT_AMBULATORY_CARE_PROVIDER_SITE_OTHER): Payer: PPO

## 2016-09-21 ENCOUNTER — Other Ambulatory Visit (INDEPENDENT_AMBULATORY_CARE_PROVIDER_SITE_OTHER): Payer: Self-pay | Admitting: Vascular Surgery

## 2016-09-21 ENCOUNTER — Telehealth: Payer: Self-pay | Admitting: *Deleted

## 2016-09-21 ENCOUNTER — Encounter (INDEPENDENT_AMBULATORY_CARE_PROVIDER_SITE_OTHER): Payer: PPO

## 2016-09-21 ENCOUNTER — Encounter (INDEPENDENT_AMBULATORY_CARE_PROVIDER_SITE_OTHER): Payer: Self-pay

## 2016-09-21 ENCOUNTER — Encounter (INDEPENDENT_AMBULATORY_CARE_PROVIDER_SITE_OTHER): Payer: Self-pay | Admitting: Vascular Surgery

## 2016-09-21 VITALS — BP 132/64 | HR 71 | Resp 16 | Ht 63.5 in | Wt 173.0 lb

## 2016-09-21 DIAGNOSIS — R600 Localized edema: Secondary | ICD-10-CM

## 2016-09-21 DIAGNOSIS — I890 Lymphedema, not elsewhere classified: Secondary | ICD-10-CM | POA: Diagnosis not present

## 2016-09-21 DIAGNOSIS — I872 Venous insufficiency (chronic) (peripheral): Secondary | ICD-10-CM

## 2016-09-21 NOTE — Telephone Encounter (Signed)
[employer] needs PA 

## 2016-09-21 NOTE — Telephone Encounter (Signed)
PA submitted to [employer] through covermymeds. Awaiting response.

## 2016-09-22 ENCOUNTER — Encounter
Admission: RE | Disposition: A | Discharge status: Home / Self Care | Payer: Self-pay | Source: Ambulatory Visit | Attending: Vascular Surgery

## 2016-09-22 ENCOUNTER — Other Ambulatory Visit: Payer: PPO | Admitting: Urology

## 2016-09-22 ENCOUNTER — Inpatient Hospital Stay: Payer: PPO

## 2016-09-22 ENCOUNTER — Ambulatory Visit
Admission: RE | Admit: 2016-09-22 | Discharge: 2016-09-22 | Disposition: A | Discharge status: Home / Self Care | Payer: PPO | Source: Ambulatory Visit | Attending: Vascular Surgery | Admitting: Vascular Surgery

## 2016-09-22 DIAGNOSIS — I10 Essential (primary) hypertension: Secondary | ICD-10-CM | POA: Insufficient documentation

## 2016-09-22 DIAGNOSIS — Z9889 Other specified postprocedural states: Secondary | ICD-10-CM | POA: Insufficient documentation

## 2016-09-22 DIAGNOSIS — K219 Gastro-esophageal reflux disease without esophagitis: Secondary | ICD-10-CM | POA: Diagnosis not present

## 2016-09-22 DIAGNOSIS — Z7982 Long term (current) use of aspirin: Secondary | ICD-10-CM | POA: Insufficient documentation

## 2016-09-22 DIAGNOSIS — N6019 Diffuse cystic mastopathy of unspecified breast: Secondary | ICD-10-CM | POA: Diagnosis not present

## 2016-09-22 DIAGNOSIS — C541 Malignant neoplasm of endometrium: Secondary | ICD-10-CM | POA: Insufficient documentation

## 2016-09-22 DIAGNOSIS — Z90722 Acquired absence of ovaries, bilateral: Secondary | ICD-10-CM | POA: Insufficient documentation

## 2016-09-22 DIAGNOSIS — Z882 Allergy status to sulfonamides status: Secondary | ICD-10-CM | POA: Diagnosis not present

## 2016-09-22 DIAGNOSIS — Z8042 Family history of malignant neoplasm of prostate: Secondary | ICD-10-CM | POA: Diagnosis not present

## 2016-09-22 DIAGNOSIS — Z820 Family history of epilepsy and other diseases of the nervous system: Secondary | ICD-10-CM | POA: Diagnosis not present

## 2016-09-22 DIAGNOSIS — Z803 Family history of malignant neoplasm of breast: Secondary | ICD-10-CM | POA: Insufficient documentation

## 2016-09-22 DIAGNOSIS — Z9049 Acquired absence of other specified parts of digestive tract: Secondary | ICD-10-CM | POA: Diagnosis not present

## 2016-09-22 DIAGNOSIS — Z87891 Personal history of nicotine dependence: Secondary | ICD-10-CM | POA: Insufficient documentation

## 2016-09-22 DIAGNOSIS — Z823 Family history of stroke: Secondary | ICD-10-CM | POA: Diagnosis not present

## 2016-09-22 DIAGNOSIS — I2510 Atherosclerotic heart disease of native coronary artery without angina pectoris: Secondary | ICD-10-CM | POA: Diagnosis not present

## 2016-09-22 DIAGNOSIS — Z833 Family history of diabetes mellitus: Secondary | ICD-10-CM | POA: Insufficient documentation

## 2016-09-22 DIAGNOSIS — Z881 Allergy status to other antibiotic agents status: Secondary | ICD-10-CM | POA: Insufficient documentation

## 2016-09-22 DIAGNOSIS — R011 Cardiac murmur, unspecified: Secondary | ICD-10-CM | POA: Insufficient documentation

## 2016-09-22 DIAGNOSIS — R600 Localized edema: Secondary | ICD-10-CM | POA: Insufficient documentation

## 2016-09-22 DIAGNOSIS — Z9109 Other allergy status, other than to drugs and biological substances: Secondary | ICD-10-CM | POA: Diagnosis not present

## 2016-09-22 DIAGNOSIS — Z8571 Personal history of Hodgkin lymphoma: Secondary | ICD-10-CM | POA: Insufficient documentation

## 2016-09-22 DIAGNOSIS — Z90710 Acquired absence of both cervix and uterus: Secondary | ICD-10-CM | POA: Insufficient documentation

## 2016-09-22 DIAGNOSIS — E282 Polycystic ovarian syndrome: Secondary | ICD-10-CM | POA: Insufficient documentation

## 2016-09-22 DIAGNOSIS — Z888 Allergy status to other drugs, medicaments and biological substances status: Secondary | ICD-10-CM | POA: Diagnosis not present

## 2016-09-22 HISTORY — PX: PORTA CATH INSERTION: CATH118285

## 2016-09-22 SURGERY — PORTA CATH INSERTION
Anesthesia: Moderate Sedation

## 2016-09-22 MED ORDER — SODIUM CHLORIDE 0.9 % IV SOLN
INTRAVENOUS | Status: DC
  Administered 2016-09-22: 13:00:00 via INTRAVENOUS

## 2016-09-22 MED ORDER — MIDAZOLAM HCL 2 MG/2ML IJ SOLN
INTRAMUSCULAR | Status: AC
  Filled 2016-09-22: qty 2

## 2016-09-22 MED ORDER — LIDOCAINE-EPINEPHRINE (PF) 2 %-1:200000 IJ SOLN
INTRAMUSCULAR | Status: AC
Start: 2016-09-22 — End: 2016-09-22
  Filled 2016-09-22: qty 20

## 2016-09-22 MED ORDER — ONDANSETRON HCL 4 MG/2ML IJ SOLN: 4.0000 mg | Freq: Four times a day (QID) | INTRAMUSCULAR | Status: DC | PRN

## 2016-09-22 MED ORDER — CEFAZOLIN IN D5W 1 GM/50ML IV SOLN
1.0000 g | Freq: Once | INTRAVENOUS | Status: AC
  Administered 2016-09-22: 1 g via INTRAVENOUS

## 2016-09-22 MED ORDER — SODIUM CHLORIDE 0.9 % IR SOLN
Freq: Once | Status: DC
  Filled 2016-09-22: qty 2

## 2016-09-22 MED ORDER — HEPARIN (PORCINE) IN NACL 2-0.9 UNIT/ML-% IJ SOLN
INTRAMUSCULAR | Status: AC
  Filled 2016-09-22: qty 500

## 2016-09-22 MED ORDER — HYDROMORPHONE HCL 1 MG/ML IJ SOLN: 1.0000 mg | Freq: Once | INTRAMUSCULAR | Status: DC | PRN

## 2016-09-22 MED ORDER — DEXTROSE 5 % IV SOLN
2.0000 g | Freq: Once | INTRAVENOUS | Status: DC
  Filled 2016-09-22: qty 2000

## 2016-09-22 MED ORDER — FENTANYL CITRATE (PF) 100 MCG/2ML IJ SOLN
INTRAMUSCULAR | Status: AC
  Filled 2016-09-22: qty 2

## 2016-09-22 MED ORDER — CEFAZOLIN SODIUM-DEXTROSE 2-4 GM/100ML-% IV SOLN
2.0000 g | Freq: Once | INTRAVENOUS | Status: DC
  Filled 2016-09-22 (×2): qty 100

## 2016-09-22 MED ORDER — MIDAZOLAM HCL 2 MG/2ML IJ SOLN
INTRAMUSCULAR | Status: AC
Start: 2016-09-22 — End: 2016-09-22
  Filled 2016-09-22: qty 4

## 2016-09-22 MED ORDER — MIDAZOLAM HCL 2 MG/2ML IJ SOLN
INTRAMUSCULAR | Status: DC | PRN
  Administered 2016-09-22: 1 mg via INTRAVENOUS
  Administered 2016-09-22: 2 mg via INTRAVENOUS
  Administered 2016-09-22: 1 mg via INTRAVENOUS

## 2016-09-22 MED ORDER — FENTANYL CITRATE (PF) 100 MCG/2ML IJ SOLN
INTRAMUSCULAR | Status: DC | PRN
  Administered 2016-09-22 (×3): 50 ug via INTRAVENOUS

## 2016-09-22 SURGICAL SUPPLY — 10 items
DERMABOND ADVANCED (GAUZE/BANDAGES/DRESSINGS) ×1
DERMABOND ADVANCED .7 DNX12 (GAUZE/BANDAGES/DRESSINGS) ×1 IMPLANT
DRAPE INCISE IOBAN 66X45 STRL (DRAPES) ×2 IMPLANT
KIT PORT POWER 8FR ISP CVUE (Catheter) ×2 IMPLANT
NEEDLE ENTRY 21GA 7CM ECHOTIP (NEEDLE) ×2 IMPLANT
PACK ANGIOGRAPHY (CUSTOM PROCEDURE TRAY) ×2 IMPLANT
SET INTRO CAPELLA COAXIAL (SET/KITS/TRAYS/PACK) ×2 IMPLANT
SUT MNCRL AB 4-0 PS2 18 (SUTURE) ×2 IMPLANT
SUT PROLENE 0 CT 1 30 (SUTURE) ×2 IMPLANT
SUTURE VIC 3-0 (SUTURE) ×2 IMPLANT

## 2016-09-22 NOTE — Patient Instructions (Signed)

## 2016-09-22 NOTE — H&P (Signed)
[facility] History & Physical Update  The patient was interviewed and re-examined.  The patient's previous History and Physical has been reviewed and is unchanged.  There is no change in the plan of care. We plan to proceed with the scheduled procedure.  Hortencia Pilar, MD  09/22/2016, 2:08 PM

## 2016-09-22 NOTE — Op Note (Signed)
OPERATIVE NOTE   PROCEDURE: 1. Placement of a left IJ Infuse-a-Port  PRE-OPERATIVE DIAGNOSIS: Endometrial carcinoma  POST-OPERATIVE DIAGNOSIS: Same  SURGEON: [name] M.D.  ANESTHESIA: Conscious sedation was administered under my direct supervision by the interventional radiology RN. IV Versed plus fentanyl were utilized. Continuous ECG, pulse oximetry and blood pressure was monitored throughout the entire procedure. Conscious sedation was for a total of 44 minutes.  ESTIMATED BLOOD LOSS: Minimal   FINDING(S): 1.  Patent vein  SPECIMEN(S): None  INDICATIONS:   Jaime Hall is a 66 y.o. female who presents with endometrial carcinoma and will require chemotherapy. She therefore requires appropriate IV access. The risks and benefits for Infuse-a-Port placement of been reviewed and the patient wishes to proceed.  DESCRIPTION: After obtaining full informed written consent, the patient was brought back to the special procedure suite and placed in the supine position. The patient's left neck and chest wall are prepped and draped in sterile fashion. Appropriate timeout was called.  Ultrasound is placed in a sterile sleeve, ultrasound is utilized to avoid vascular injury as well as secondary to lack of appropriate landmarks. The left internal jugular vein is identified. It is echolucent and homogeneous as well as easily compressible indicating patency. An image is recorded for the permanent record.  Access to the vein with a micropuncture needle is done under direct ultrasound visualization.  1% lidocaine is infiltrated into the soft tissue at the base of the neck as well as on the chest wall.  Under direct ultrasound visualization a micro-needle is inserted into the vein followed by the micro-wire. Micro-sheath was then advanced and a J wire is inserted without difficulty under fluoroscopic guidance. A small counterincision was created at the wire insertion site. A transverse  incision is created 2 fingerbreadths below the scapula and a pocket is fashioned using both blunt and sharp dissection. The pocket is tested for appropriate size with the hub of the Infuse-a-Port. The tunneling device is then used to pull the intravascular portion of the catheter from the pocket to the neck counterincision.  Dilator and peel-away sheath were then inserted over the wire and the wire is removed. Catheter is then advanced into the venous system without difficulty. Peel-away sheath was then removed.  Catheter is then positioned under fluoroscopic guidance at the atrial caval junction. It is then transected connected to the hub and the hope is slipped into the subcutaneous pocket on the chest wall. The hub was then accessed percutaneously and aspirates easily and flushes well and is flushed with 30 cc of heparinized saline. The pocket incision is then closed in layers using interrupted 3-0 Vicryl for the subcutaneous tissues and 4-0 Monocryl subcuticular for skin closure. Dermabond is applied. The neck counterincision was closed with 4-0 Monocryl subcuticular and Dermabond as well.  The patient tolerated the procedure well and there were no immediate complications.  COMPLICATIONS: None  CONDITION: Unchanged  [name] M.D. [facility] Office: [phone]   09/22/2016, 3:27 PM

## 2016-09-23 ENCOUNTER — Inpatient Hospital Stay: Payer: PPO | Admitting: Obstetrics and Gynecology

## 2016-09-23 ENCOUNTER — Encounter: Payer: Self-pay | Admitting: Vascular Surgery

## 2016-09-23 VITALS — BP 129/83 | HR 83 | Temp 97.1°F | Resp 18 | Wt 177.3 lb

## 2016-09-23 DIAGNOSIS — C541 Malignant neoplasm of endometrium: Secondary | ICD-10-CM

## 2016-09-23 DIAGNOSIS — Z5111 Encounter for antineoplastic chemotherapy: Secondary | ICD-10-CM | POA: Diagnosis not present

## 2016-09-23 NOTE — Progress Notes (Signed)
Gynecologic Oncology Interval Visit   Referring Provider: Dr. Servando Salina  Chief Concern: Grade 1 endometrial cancer  Subjective:  Jaime Hall is a 66 y.o. G2P2 female who is seen in consultation from [name] and Garwin Brothers for grade 1 endometrial cancer.  Since her last visit she has had a CT scan on 09/10/2016 with findings that are very reassuring:  1. Interval resolution of abscess involving the vaginal cuff. 2. The proximal end of the right ureteral stent is coiled in the proximal right ureter. No hydronephrosis. 3. No evidence of metastatic disease or other significant changes. 4. Aortic atherosclerosis.  She is getting her stent removed tomorrow.   She saw Dr. Janese Banks on 09/17/2016 and is prepared for chemotherapy with paclitaxel and carboplatin.   She had a port placed 09/22/2016 with Dr Delana Meyer.    Her2neu assessment was performed given serous histology and was negative.    Gynecologic Oncology History Jaime Hall is a pleasant G56P2 female who is seen in consultation from [name] and Garwin Brothers for grade 1 endometrial cancer. See prior note for complete details.   Endometrial biopsy done 07/15/16 showed grade 1 endometrial cancer. Surgical management was recommended.   History is also significant for h/o Hodgkin's Lymphoma 2011 treated successfully at [facility] with chemo and RT.   Bladder sling for SUI 2009.  Benign cervical polyp removed 5/16. Has some leg edema due to chronic venous insufficiency.  Had angioplasty and cardiac stents in 2013 and was on Plavix for three years, now just ASA.  Surgical management was recommended.   On 07/29/2016 she underwent robotic hysterectomy, and bilateral salpingo-oophorectomy with washings as well as sentinel node injection, mapping, and biopsy; pelvic adhesiolysis including enterolysis and ovariolysis > 45 minutes. She was noted to have severe pelvic adhesive disease involving gynecologic organs and the bowel. The cul de sac was  obliterated and adherent to the posterior cervix. A bubble test was performed and was negative.   Postop she presented with fevers on 08/05/2016 and was admitted. CT scan 08/05/1016 revealed small pockets of air in the posterior pelvic floor likely postsurgical. There is a 1.8 x 3.8 cm complex fluid collection containing pockets of air with somewhat organized walls within the pelvis at the hysterectomy bed most consistent with developing abscess. There is superior extension of loculated fluid along the left lateral pelvic wall anterior to the left psoas muscle. The component of the loculated fluid along the left lateral pelvic wall measures 2.0 x 4.0 cm and fluid collection along the anterior surface of the left psoas muscle measures approximately 1.8 x 3.6 cm. Blood cultures: + bacterimia with bacteroides fragilis; urine cultures negative.   She was treated with IV antibiotics with improvement in her symptoms. She was discharged on 09/05/2015 on Augmentin.   She was seen by Dr. Leonides Schanz and complained of vaginal leakage. 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 Subsequently she underwent a double tied tests with oral Pyridium as well as backfilling the bladder with blue. 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  Fluid in her vaginal vault was also appreciated. She was referred to Dr. [name], Urology on 08/14/2016. No obvious fistula or leak appreciated on CT urogram, although there is no complete delayed imaging to assess the right-sided ureter.   CT urogram 08/13/2016 IMPRESSION: 1. Stable size of an abscess at the vaginal cuff with superior extension along the left pelvic sidewall to the posterior margin of the  mid sigmoid colon. 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 2. No hydronephrosis. Normal caliber ureters. No evidence of contrast extravasation from the ureters or bladder, with limitations as  described. 3. No evidence of metastatic disease in the abdomen or pelvis. 4. Aortic atherosclerosis.   On 08/17/3016 she underwent Exam under anesthesia, vaginoscopy, cystoscopy, cystogram with interpretation of fluoroscopy less than 30 minutes, bilateral retrograde pyelogram, and right ureteral stent placement.   Intraoperative findings: No obvious ureterovaginal vesicovaginal fistula appreciated. Mild right hydroureteronephrosis down to level of the distal ureter with slight medial deviation without filling defects. Slight mucopurulent drainage from left apical vaginal cuff, specimen cultured.  Culture revealed bateroides fragilis and she was started on a 2 week course of Flagyl iniated on 08/24/2016.  Since that time she continues to do well. She presents today to discuss pathology findings and treatment plan. She is due to have the stent removed on 09/22/2016.   Pathology DIAGNOSIS:  A. SENTINEL LYMPH NODE, RIGHT MID OBTURATOR; EXCISION:  - NO TUMOR SEEN IN ONE LYMPH NODE (0/1).   B. SENTINEL LYMPH NODE, RIGHT EXTERNAL ILIAC; EXCISION:  - NO TUMOR SEEN IN ONE LYMPH NODE (0/1).   C. SENTINEL LYMPH NODE, LEFT EXTERNAL ILIAC; EXCISION:  - NO TUMOR SEEN IN ONE LYMPH NODE (0/1).   D. UTERUS WITH CERVIX, BILATERAL FALLOPIAN TUBES AND OVARIES;  HYSTERECTOMY AND BILATERAL SALPINGO-OOPHORECTOMY:  - SEROUS CARCINOMA, 4.5 CM(SEE NOTE).  - LYMPHOVASCULAR INVASION PRESENT.  - NO TUMOR SEEN IN BILATERAL OVARIES AND FALLOPIAN TUBES.    ENDOMETRIUM:  Procedure: Hysterectomy with bilateral salpingo-oophorectomy  Histologic Type: Serous carcinoma  Myometrial Invasion: Present       Depth of invasion (millimeters): 13 mm  Myometrial thickness (millimeters): 14 mm       Percentage of myometrial invasion: 93%  Uterine Serosa Involvement: Not identified  Cervical Stromal Involvement: Not identified    BIOMARKER REPORTING TEMPLATE:  p53 Expression: Abnormal strong diffuse  overexpression (>90%)  ER/PR negative.  MS stable  Chemotherapy was recommended over radiation therapy given concern of possible GU fistula. She was referred to Dr. Janese Banks.   Problem List: Patient Active Problem List   Diagnosis Date Noted  . 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 SIRS (systemic inflammatory response syndrome) ([place]) 08/05/2016  . Endometrial cancer ([place])   . Pelvic adhesive disease   . Lymphedema 07/20/2016  . Chronic venous insufficiency 07/20/2016  . PAD (peripheral artery disease) ([place]) 07/20/2016  . Postmenopausal bleeding 07/14/2016  . Influenza 07/14/2016  . Class 1 obesity due to excess calories without serious comorbidity with body mass index (BMI) of 34.0 to 34.9 in adult 05/27/2016  . Chronic cough 12/19/2015  . Pre-syncope 12/19/2015  . Hx of colonic polyps   . Benign neoplasm of sigmoid colon   . Dysphagia, pharyngoesophageal phase 10/01/2014  . Prolapsed, uterovaginal, incomplete 06/24/2014  . Routine general medical examination at a health care facility 12/30/2012  . Obesity (BMI 30-39.9) 12/30/2012  . Hx of multiple pulmonary nodules 12/28/2012  . Plantar fasciitis of right foot 12/28/2012  . 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 Edema of both legs 09/12/2012  . Hip pain, bilateral 09/12/2012  . History of Hodgkin's lymphoma 09/12/2012  . Coronary artery disease   . Hyperlipidemia   . Chest pain on exertion 02/29/2012    Past Medical History: Past Medical History:  Diagnosis Date  . Anxiety   . Cervicalgia   . Coronary artery disease    a. 02/2012 Stress echo: severe anterior wall  ischemia;  b. 02/2012 Cath/PCI: LAD 95p (3.0 x 15 Xience EX DES), D1 90ost (PTCA - bifurcational dzs), EF 45% with anterior HK;  b. 02/2013 Ex MV: fixed anterior defect w/ minor reversibility, nl EF-->Med Rx.  . Endometrial cancer ([place])    a. 07/2016 s/p robotic hysterectomy, BSO w/ washings, sentinel node inj, mapping, bx, adhesiolysis.  . Essential  hypertension, benign   . Fibrocystic breast disease   . GERD (gastroesophageal reflux disease)   . Gestational hypertension   . Heart murmur   . History of anemia   . History of blood transfusion   . Hodgkin's lymphoma ([place]) 2011   a. s/p radiation and chemo therapy  . Osteoarthritis   . Polycystic ovarian disease     Past Surgical History: Past Surgical History:  Procedure Laterality Date  . ABDOMINAL HYSTERECTOMY    . bladder sling    . CARDIAC CATHETERIZATION  02/2012   [facility] 1 stent place  . CERVICAL POLYPECTOMY    . CHOLECYSTECTOMY  1982  . COLONOSCOPY WITH PROPOFOL N/A 02/05/2015   Procedure: COLONOSCOPY WITH PROPOFOL;  Surgeon: Lucilla Lame, MD;  Location: [facility] ENDOSCOPY;  Service: Endoscopy;  Laterality: N/A;  . CORONARY ANGIOPLASTY  02/2012   left/right s/p balloon  . CYSTOGRAM N/A 08/17/2016   Procedure: CYSTOGRAM;  Surgeon: Hollice Espy, MD;  Location: [facility] ORS;  Service: Urology;  Laterality: N/A;  . CYSTOSCOPY N/A 08/17/2016   Procedure: CYSTOSCOPY EXAM UNDER ANESTHESIA;  Surgeon: Hollice Espy, MD;  Location: [facility] ORS;  Service: Urology;  Laterality: N/A;  . CYSTOSCOPY W/ RETROGRADES Bilateral 08/17/2016   Procedure: CYSTOSCOPY WITH RETROGRADE PYELOGRAM;  Surgeon: Hollice Espy, MD;  Location: [facility] ORS;  Service: Urology;  Laterality: Bilateral;  . CYSTOSCOPY WITH STENT PLACEMENT Right 08/17/2016   Procedure: CYSTOSCOPY WITH STENT PLACEMENT;  Surgeon: Hollice Espy, MD;  Location: [facility] ORS;  Service: Urology;  Laterality: Right;  . heart stent'  2013  . kidney stent Right 2018  . LYMPH NODE BIOPSY  2011   diagnosis of hodgkins lymphoma  . PELVIC LYMPH NODE DISSECTION N/A 07/29/2016   Procedure: PELVIC/AORTIC LYMPH NODE SAMPLING;  Surgeon: [name], MD;  Location: [facility] ORS;  Service: Gynecology;  Laterality: N/A;  . PORTA CATH INSERTION N/A 09/22/2016   Procedure: Glori Luis Cath Insertion;  Surgeon: [name], MD;  Location: [place] [facility];   Service: Cardiovascular;  Laterality: N/A;  . ROBOTIC ASSISTED TOTAL HYSTERECTOMY WITH BILATERAL SALPINGO OOPHERECTOMY N/A 07/29/2016   Procedure: ROBOTIC ASSISTED TOTAL HYSTERECTOMY WITH BILATERAL SALPINGO OOPHORECTOMY;  Surgeon: [name], MD;  Location: [facility] ORS;  Service: Gynecology;  Laterality: N/A;  . SENTINEL NODE BIOPSY N/A 07/29/2016   Procedure: SENTINEL NODE BIOPSY;  Surgeon: [name], MD;  Location: [facility] ORS;  Service: Gynecology;  Laterality: N/A;  . transobturator sling N/A 2009   [place]    Family History: Family History  Problem Relation Age of Onset  . ALS Father   . Polymyositis Father   . Diabetes Brother   . Cancer Maternal Aunt     breast  . Breast cancer Maternal Aunt     30's  . Stroke Maternal Grandmother   . Cancer Maternal Grandfather     prostate  . Stroke Maternal Grandfather     Social History: Works as Dealer Social History   Social History  . Marital status: Widowed    Spouse name: N/A  . Number of children: N/A  . Years of education: N/A  Occupational History  . Not on file.   Social History Main Topics  . Smoking status: Former Smoker    Packs/day: 1.00    Years: 30.00    Types: Cigarettes    Quit date: 07/24/2002  . Smokeless tobacco: Never Used     Comment: quit smoking in 2000  . Alcohol use Yes     Comment: occasional, 1-2 drinks per month  . Drug use: No  . Sexual activity: No   Other Topics Concern  . Not on file   Social History Narrative  . No narrative on file    Allergies: Allergies  Allergen Reactions  . Adhesive [Tape] Other (See Comments)    Burns the skin  . Z-Pak [Azithromycin] Itching  . Antifungal [Miconazole Nitrate] Rash  . Sulfa Antibiotics Nausea And Vomiting and Rash    N/T    Current Medications: Current Outpatient Prescriptions  Medication Sig Dispense Refill  . acetaminophen (TYLENOL) 500 MG tablet Take 1,000 mg by mouth every 6 (six) hours as  needed.    Marland Kitchen aspirin EC 81 MG tablet Take 81 mg by mouth at bedtime.    Marland Kitchen atorvastatin (LIPITOR) 10 MG tablet TAKE ONE TABLET BY MOUTH ONCE DAILY (Patient taking differently: Take 10 mg by mouth at bedtime. ) 90 tablet 3  . BIOTIN 5000 PO Take 1 Dose by mouth daily.    . Cholecalciferol (VITAMIN D3) 2000 units TABS Take 2,000 Units by mouth 3 (three) times a week.    Marland Kitchen dexamethasone (DECADRON) 4 MG tablet Take 2 tablets (8 mg total) by mouth daily. Start the day after chemotherapy for 2 days. 30 tablet 1  . diazepam (VALIUM) 5 MG tablet Take 1 tablet (5 mg total) by mouth every 12 (twelve) hours as needed for anxiety. 30 tablet 1  . esomeprazole (NEXIUM) 20 MG capsule Take 20 mg by mouth daily before breakfast.     . ibuprofen (ADVIL,MOTRIN) 200 MG tablet Take 200 mg by mouth every 6 (six) hours as needed.    . lidocaine-prilocaine (EMLA) cream Apply to affected area once 30 g 3  . LORazepam (ATIVAN) 0.5 MG tablet Take 1 tablet (0.5 mg total) by mouth every 6 (six) hours as needed (Nausea or vomiting). 30 tablet 0  . metoprolol tartrate (LOPRESSOR) 25 MG tablet TAKE ONE TABLET BY MOUTH TWICE DAILY 180 tablet 2  . metroNIDAZOLE (FLAGYL) 500 MG tablet     . naproxen sodium (ANAPROX) 220 MG tablet Take 440 mg by mouth 2 (two) times daily as needed (for pain/headache.).    Marland Kitchen nitroGLYCERIN (NITROSTAT) 0.4 MG SL tablet Place 1 tablet (0.4 mg total) under the tongue every 5 (five) minutes as needed for chest pain. 25 tablet 3  . ondansetron (ZOFRAN) 8 MG tablet Take 1 tablet (8 mg total) by mouth 2 (two) times daily as needed for refractory nausea / vomiting. Start on day 3 after chemo. 30 tablet 1  . oxybutynin (DITROPAN) 5 MG tablet     . oxyCODONE (OXY IR/ROXICODONE) 5 MG immediate release tablet     . prochlorperazine (COMPAZINE) 10 MG tablet Take 1 tablet (10 mg total) by mouth every 6 (six) hours as needed (Nausea or vomiting). 30 tablet 1  . tamsulosin (FLOMAX) 0.4 MG CAPS capsule     . Turmeric  500 MG CAPS Take 500 mg by mouth 2 (two) times daily.    . vitamin B-12 (CYANOCOBALAMIN) 1000 MCG tablet Take 1,000 mcg by mouth daily.  No current facility-administered medications for this visit.     Review of Systems General: negative, no fevers  HEENT: concerned about swelling around the left next catheter site  Lungs: no complaints  Cardiac: no complaints  GI: no complaints  GU: no nausea, vomiting, diarrhea, constipation  Musculoskeletal: no complaints  Extremities: no complaints  Skin: redness chest around the port site  Neuro: depression  Endocrine: no complaints  Psych: no complaints       Objective:  Physical Examination:  BP 129/83 (BP Location: Left Arm, Patient Position: Sitting)   Pulse 83   Temp 97.1 F (36.2 C) (Tympanic)   Resp 18   Wt 177 lb 4.8 oz (80.4 kg)   BMI 30.91 kg/m    ECOG Performance Status: 1 - Symptomatic but completely ambulatory  General appearance: alert, cooperative and appears stated age HEENT:PERRLA, extra ocular movement intact and sclera clear, anicteric Lymph node survey: non-palpable, axillary, inguinal, supraclavicular, there is a 1.5-2 cm swelling by the catheter insertion site on the left.  Abdomen: soft, non-tender, without masses or organomegaly, no hernias and well healed incision Back: inspection of back is normal Skin exam - erythema around the chest port incision measuring about 5 cm and warm to palpation.  Neurological exam reveals alert, oriented, normal speech, no focal findings or movement disorder noted.  Pelvic: deferred based on last exam. exam chaperoned by nurse;  Vulva: normal appearing vulva with no masses, tenderness or lesions; Vagina: normal vagina; cuff intact; no drainage or discharge appreciated. Sutures all present; cuff probed with Q-tube and no defects appreciated.  Adnexa/Uterus/Cervix:absent. Fullness present at the cuff which is consistent with known abscess; nontender.  Rectal: normal rectal,  confirmatory    Assessment:  Jaime Hall is a 66 y.o. 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 Complete resolution of vaginal cuff abscess.  Severe pelvic adhesive disease.   s/p ureteral stent.   s/p IV port placement with concern for cellulitis vs bruising.   Medical co-morbidities complicating care: 30 pack year former smoker s/p coronary angioplasty and stents 2013 and takes baby ASA.  Plan:   Problem List Items Addressed This Visit      Genitourinary   Endometrial cancer ([place]) - Primary     Previously, we discussed options for management for stage 1 serous HIR endometrial cancer including adjuvant chemotherapy vs observation. I discussed that she has a 20-25% chance of recurrence.  I had not recommended radiation given the pelvic vaginal cuff abscess and possible ureterovaginal fistula. But recently the results from [place] were represented at the Annual SGO meeting. The findings demonstrate no difference in RFS or OS with Vaginal brachytherapy and chemotherapy vs WPRT. But there was 50% reduction in pelvic and PA nodal basin (9% to 4%) recurrence with WPRT compared the the other arm. There was a 18% distant recurrence rate for both arms. Given this information we should reconsider WPRT. She has completely healed at this point and the stent will be removed tomorrow. I spoke with Dr. Janese Banks regarding the plan. A lot will depend if her IV port is ok as I am concerned about cellulitis. She is seeing the vascular surgeons tomorrow and we will make a treatment decision based on what they decide.    The patient's diagnosis, an outline of the further  diagnostic and laboratory studies which will be required, the  recommendation, and alternatives were discussed.  All questions were answered to the patient's satisfaction.  A total of 60 minutes were spent with the patient/family today; 30% was spent in education, counseling and coordination of care for endometrial cancer.    [name], MD  CC:  [name], MD [address] [place], [place] [address] [phone]  Larey Days, MD  Hollice Espy, MD

## 2016-09-23 NOTE — Progress Notes (Signed)
Patient is here today for follow up, she had a port placed yesterday and is complaining of neck pain and redness due to that. No there complaints

## 2016-09-24 ENCOUNTER — Telehealth: Payer: Self-pay | Admitting: *Deleted

## 2016-09-24 ENCOUNTER — Encounter (INDEPENDENT_AMBULATORY_CARE_PROVIDER_SITE_OTHER): Payer: Self-pay | Admitting: Vascular Surgery

## 2016-09-24 ENCOUNTER — Encounter: Payer: Self-pay | Admitting: Urology

## 2016-09-24 ENCOUNTER — Ambulatory Visit (INDEPENDENT_AMBULATORY_CARE_PROVIDER_SITE_OTHER): Payer: PPO | Admitting: Vascular Surgery

## 2016-09-24 ENCOUNTER — Ambulatory Visit (INDEPENDENT_AMBULATORY_CARE_PROVIDER_SITE_OTHER): Payer: PPO | Admitting: Urology

## 2016-09-24 VITALS — BP 125/67 | HR 72 | Resp 16 | Ht 61.0 in | Wt 177.0 lb

## 2016-09-24 VITALS — BP 101/67 | HR 76 | Ht 61.0 in | Wt 173.0 lb

## 2016-09-24 DIAGNOSIS — C55 Malignant neoplasm of uterus, part unspecified: Secondary | ICD-10-CM | POA: Diagnosis not present

## 2016-09-24 DIAGNOSIS — I872 Venous insufficiency (chronic) (peripheral): Secondary | ICD-10-CM

## 2016-09-24 DIAGNOSIS — I739 Peripheral vascular disease, unspecified: Secondary | ICD-10-CM

## 2016-09-24 DIAGNOSIS — C541 Malignant neoplasm of endometrium: Secondary | ICD-10-CM

## 2016-09-24 DIAGNOSIS — N898 Other specified noninflammatory disorders of vagina: Secondary | ICD-10-CM

## 2016-09-24 LAB — URINALYSIS, COMPLETE
Bilirubin, UA: NEGATIVE
Glucose, UA: NEGATIVE
Ketones, UA: NEGATIVE
Leukocytes, UA: NEGATIVE
Nitrite, UA: NEGATIVE
Protein, UA: NEGATIVE
RBC, UA: NEGATIVE
Specific Gravity, UA: 1.015 (ref 1.005–1.030)
Urobilinogen, Ur: 0.2 mg/dL (ref 0.2–1.0)
pH, UA: 5.5 (ref 5.0–7.5)

## 2016-09-24 MED ORDER — CIPROFLOXACIN HCL 500 MG PO TABS
500.0000 mg | ORAL_TABLET | Freq: Once | ORAL | Status: AC
  Administered 2016-09-24: 500 mg via ORAL

## 2016-09-24 MED ORDER — LIDOCAINE HCL 2 % EX GEL
1.0000 "application " | Freq: Once | CUTANEOUS | Status: AC
  Administered 2016-09-24: 1 via URETHRAL

## 2016-09-24 NOTE — Progress Notes (Signed)
MRN : [id number]  Jaime Hall is a 66 y.o. (1950-06-20) female who presents with chief complaint of  Chief Complaint  Patient presents with  . Re-evaluation    Check port site  .  History of Present Illness:  The patient returns approximately 2 days after left IJ port insertion.  There is concern that the port is infected.  She denies pain at the site.  She denies fever or chills  No outpatient prescriptions have been marked as taking for the 09/24/16 encounter (Office Visit) with [name], MD.    Past Medical History:  Diagnosis Date  . Anxiety   . Cervicalgia   . Coronary artery disease    a. 02/2012 Stress echo: severe anterior wall ischemia;  b. 02/2012 Cath/PCI: LAD 95p (3.0 x 15 Xience EX DES), D1 90ost (PTCA - bifurcational dzs), EF 45% with anterior HK;  b. 02/2013 Ex MV: fixed anterior defect w/ minor reversibility, nl EF-->Med Rx.  . Endometrial cancer ([place])    a. 07/2016 s/p robotic hysterectomy, BSO w/ washings, sentinel node inj, mapping, bx, adhesiolysis.  . Essential hypertension, benign   . Fibrocystic breast disease   . GERD (gastroesophageal reflux disease)   . Gestational hypertension   . Heart murmur   . History of anemia   . History of blood transfusion   . Hodgkin's lymphoma ([place]) 2011   a. s/p radiation and chemo therapy  . Osteoarthritis   . Polycystic ovarian disease     Past Surgical History:  Procedure Laterality Date  . ABDOMINAL HYSTERECTOMY    . bladder sling    . CARDIAC CATHETERIZATION  02/2012   [facility] 1 stent place  . CERVICAL POLYPECTOMY    . CHOLECYSTECTOMY  1982  . COLONOSCOPY WITH PROPOFOL N/A 02/05/2015   Procedure: COLONOSCOPY WITH PROPOFOL;  Surgeon: Lucilla Lame, MD;  Location: [facility];  Service: Endoscopy;  Laterality: N/A;  . CORONARY ANGIOPLASTY  02/2012   left/right s/p balloon  . CYSTOGRAM N/A 08/17/2016   Procedure: CYSTOGRAM;  Surgeon: Hollice Espy, MD;  Location: [facility];  Service: Urology;  Laterality:  N/A;  . CYSTOSCOPY N/A 08/17/2016   Procedure: CYSTOSCOPY EXAM UNDER ANESTHESIA;  Surgeon: Hollice Espy, MD;  Location: [facility];  Service: Urology;  Laterality: N/A;  . CYSTOSCOPY W/ RETROGRADES Bilateral 08/17/2016   Procedure: CYSTOSCOPY WITH RETROGRADE PYELOGRAM;  Surgeon: Hollice Espy, MD;  Location: [facility];  Service: Urology;  Laterality: Bilateral;  . CYSTOSCOPY WITH STENT PLACEMENT Right 08/17/2016   Procedure: CYSTOSCOPY WITH STENT PLACEMENT;  Surgeon: Hollice Espy, MD;  Location: [facility];  Service: Urology;  Laterality: Right;  . heart stent'  2013  . kidney stent Right 2018  . LYMPH NODE BIOPSY  2011   diagnosis of hodgkins lymphoma  . PELVIC LYMPH NODE DISSECTION N/A 07/29/2016   Procedure: PELVIC/AORTIC LYMPH NODE SAMPLING;  Surgeon: [name], MD;  Location: [facility];  Service: Gynecology;  Laterality: N/A;  . PORTA CATH INSERTION N/A 09/22/2016   Procedure: Glori Luis Cath Insertion;  Surgeon: [name], MD;  Location: [facility];  Service: Cardiovascular;  Laterality: N/A;  . ROBOTIC ASSISTED TOTAL HYSTERECTOMY WITH BILATERAL SALPINGO OOPHERECTOMY N/A 07/29/2016   Procedure: ROBOTIC ASSISTED TOTAL HYSTERECTOMY WITH BILATERAL SALPINGO OOPHORECTOMY;  Surgeon: [name], MD;  Location: [facility];  Service: Gynecology;  Laterality: N/A;  . SENTINEL NODE BIOPSY N/A 07/29/2016   Procedure: SENTINEL NODE BIOPSY;  Surgeon: [name], MD;  Location: [facility];  Service: Gynecology;  Laterality: N/A;  . transobturator sling N/A 2009   [place]    Social History Social History  Substance Use Topics  . Smoking status: Former Smoker    Packs/day: 1.00    Years: 30.00    Types: Cigarettes    Quit date: 07/24/2002  . Smokeless tobacco: Never Used     Comment: quit smoking in 2000  . Alcohol use Yes     Comment: occasional, 1-2 drinks per month    Family History Family History  Problem Relation Age of Onset  . ALS Father   .  Polymyositis Father   . Diabetes Brother   . Cancer Maternal Aunt     breast  . Breast cancer Maternal Aunt     30's  . Stroke Maternal Grandmother   . Cancer Maternal Grandfather     prostate  . Stroke Maternal Grandfather     Allergies  Allergen Reactions  . Adhesive [Tape] Other (See Comments)    Burns the skin  . Z-Pak [Azithromycin] Itching  . Antifungal [Miconazole Nitrate] Rash  . Sulfa Antibiotics Nausea And Vomiting and Rash    N/T     REVIEW OF SYSTEMS (Negative unless checked)  Constitutional: [] Weight loss  [] Fever  [] Chills Cardiac: [] Chest pain   [] Chest pressure   [] Palpitations   [] Shortness of breath when laying flat   [] Shortness of breath with exertion. Vascular:  [] Pain in legs with walking   [] Pain in legs at rest  [] History of DVT   [] Phlebitis   [] Swelling in legs   [] Varicose veins   [] Non-healing ulcers Pulmonary:   [] Uses home oxygen   [] Productive cough   [] Hemoptysis   [] Wheeze  [] COPD   [] Asthma Neurologic:  [] Dizziness   [] Seizures   [] History of stroke   [] History of TIA  [] Aphasia   [] Vissual changes   [] Weakness or numbness in arm   [] Weakness or numbness in leg Musculoskeletal:   [] Joint swelling   [] Joint pain   [] Low back pain Hematologic:  [] Easy bruising  [] Easy bleeding   [] Hypercoagulable state   [] Anemic Gastrointestinal:  [] Diarrhea   [] Vomiting  [] Gastroesophageal reflux/heartburn   [] Difficulty swallowing. Genitourinary:  [] Chronic kidney disease   [] Difficult urination  [] Frequent urination   [] Blood in urine Skin:  [] Rashes   [] Ulcers  Psychological:  [] History of anxiety   []  History of major depression.  Physical Examination  Vitals:   09/24/16 0829  BP: 125/67  Pulse: 72  Resp: 16  Weight: 177 lb (80.3 kg)  Height: 5\' 1"  (1.549 m)   Body mass index is 33.44 kg/m. Gen: WD/WN, NAD Head: [place]/AT, No temporalis wasting.  Ear/Nose/Throat: Hearing grossly intact, nares w/o erythema or drainage, poor dentition Eyes: PER,  EOMI, sclera nonicteric.  Neck: Supple, no masses.  No bruit or JVD.  Pulmonary:  Good air movement, clear to auscultation bilaterally, no use of accessory muscles.  Cardiac: RRR, normal S1, S2, no Murmurs. Vascular: left IJ port with a dark redness not warm to the touch no drainage no edema no tenderness.  Incision is intact Gastrointestinal: soft, non-distended. No guarding/no peritoneal signs.  Musculoskeletal: M/S 5/5 throughout.  No deformity or atrophy.  Neurologic: CN 2-12 intact. Pain and light touch intact in extremities.  Symmetrical.  Speech is fluent. Motor exam as listed above. Psychiatric: Judgment intact, Mood & affect appropriate for pt's clinical situation. Dermatologic: No rashes or ulcers noted.  No changes consistent with cellulitis. Lymph : No Cervical lymphadenopathy, no lichenification or  skin changes of chronic lymphedema.  CBC Lab Results  Component Value Date   WBC 8.7 09/02/2016   HGB 13.7 09/02/2016   HCT 40.5 09/02/2016   MCV 87.7 09/02/2016   PLT 196 09/02/2016    BMET    Component Value Date/Time   NA 141 08/17/2016 0626   NA 142 03/27/2014 1214   K 4.0 08/17/2016 0626   K 4.6 03/27/2014 1214   CL 109 08/07/2016 0448   CL 107 03/27/2014 1214   CO2 26 08/07/2016 0448   CO2 33 (H) 03/27/2014 1214   GLUCOSE 102 (H) 08/17/2016 0626   GLUCOSE 77 03/27/2014 1214   BUN 11 08/07/2016 0448   BUN 14 03/27/2014 1214   CREATININE 1.06 (H) 09/02/2016 1300   CREATININE 1.04 03/27/2014 1214   CALCIUM 8.2 (L) 08/07/2016 0448   CALCIUM 8.7 03/27/2014 1214   GFRNONAA 53 (L) 09/02/2016 1300   GFRNONAA 57 (L) 03/27/2014 1214   GFRNONAA 43 (L) 07/21/2013 1429   GFRAA >60 09/02/2016 1300   GFRAA >60 03/27/2014 1214   GFRAA 50 (L) 07/21/2013 1429   CrCl cannot be calculated (Patient's most recent lab result is older than the maximum 21 days allowed.).  COAG Lab Results  Component Value Date   INR 1.02 08/05/2016   INR 0.92 07/24/2016   INR 1.0 07/13/2016     Radiology Ct Abdomen Pelvis W Contrast  Result Date: 09/11/2016 CLINICAL DATA:  Endometrial cancer status post total hysterectomy. Hodgkin's lymphoma in 2011. No current complaints. EXAM: CT ABDOMEN AND PELVIS WITH CONTRAST TECHNIQUE: Multidetector CT imaging of the abdomen and pelvis was performed using the standard protocol following bolus administration of intravenous contrast. CONTRAST:  125mL ISOVUE-300 IOPAMIDOL (ISOVUE-300) INJECTION 61% COMPARISON:  CT 08/13/2016 and 08/05/2016. FINDINGS: Lower chest: Clear lung bases. No significant pleural or pericardial effusion. Hepatobiliary: No focal hepatic abnormalities are demonstrated. There is stable mild biliary dilatation status post cholecystectomy, within physiologic limits. Pancreas: Unremarkable. No pancreatic ductal dilatation or surrounding inflammatory changes. Spleen: Normal in size without focal abnormality. Adrenals/Urinary Tract: Both adrenal glands appear normal. There are stable low-density renal lesions bilaterally, largest in the lower pole of the right kidney consistent with a simple cyst. There has been interval placement of a right ureteral stent. Superiorly, this stent is coiled in the proximal right ureter. The distal end of the stent is within the bladder. There is no evidence of hydronephrosis or delay in contrast excretion. No bladder lesions are evident. There is no air within the urinary bladder lumen in this patient with a history of repaired vaginal fistula. Stomach/Bowel: No evidence of bowel wall thickening, distention or surrounding inflammatory change. The appendix appears normal. There is moderate stool throughout the colon. Vascular/Lymphatic: There are no enlarged abdominal or pelvic lymph nodes. Stable aortic and branch vessel atherosclerosis. Reproductive: Hysterectomy. No evidence of adnexal mass. There is no evidence of residual fluid collection, inflammation or mass in the region of the vaginal cuff. Other: No  ascites or peritoneal nodularity. Stable postsurgical changes in the anterior abdominal wall. Musculoskeletal: No acute or significant osseous findings. Grade 1 degenerative anterolisthesis at L4-5. IMPRESSION: 1. Interval resolution of abscess involving the vaginal cuff. 2. The proximal end of the right ureteral stent is coiled in the proximal right ureter. No hydronephrosis. 3. No evidence of metastatic disease or other significant changes. 4. Aortic atherosclerosis. Electronically Signed   By: Richardean Sale M.D.   On: 09/11/2016 08:16   Assessment/Plan 1. Endometrial cancer [facility]) Port placed a  bout two days ago although it appears a bit more red than usual it does not appear  Infected.  I would plan to move forward with her chemo this coming Thursday  2. Chronic venous insufficiency No surgery or intervention at this point in time.    I have had a long discussion with the patient regarding venous insufficiency and why it  causes symptoms. I have discussed with the patient the chronic skin changes that accompany venous insufficiency and the long term sequela such as infection and ulceration.  Patient will begin wearing graduated compression stockings class 1 (20-30 mmHg) or compression wraps on a daily basis a prescription was given. The patient will put the stockings on first thing in the morning and removing them in the evening. The patient is instructed specifically not to sleep in the stockings.    In addition, behavioral modification including several periods of elevation of the lower extremities during the day will be continued. I have demonstrated that proper elevation is a position with the ankles at heart level.  The patient is instructed to begin routine exercise, especially walking on a daily basis  Following the review of the ultrasound the patient will follow up in 2-3 months to reassess the degree of swelling and the control that graduated compression stockings or compression wraps   is offering.   The patient can be assessed for a Lymph Pump at that time  3. PAD (peripheral artery disease) (HCC)  Recommend:  The patient has evidence of atherosclerosis of the lower extremities with claudication.  The patient does not voice lifestyle limiting changes at this point in time.  Noninvasive studies do not suggest clinically significant change.  No invasive studies, angiography or surgery at this time The patient should continue walking and begin a more formal exercise program.  The patient should continue antiplatelet therapy and aggressive treatment of the lipid abnormalities  No changes in the patient's medications at this time  The patient should continue wearing graduated compression socks 10-15 mmHg strength to control the mild edema.      Hortencia Pilar, MD  09/24/2016 4:51 PM

## 2016-09-24 NOTE — Progress Notes (Signed)
   09/24/16  CC:  Chief Complaint  Patient presents with  . Cysto Stent Removal    HPI: 66 year old female status post robotic total abdominal hysterectomy with bilateral salpingo-inject any, bilateral pelvic lymph node dissection who developed a pelvic abscess. 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 This is not demonstrated on retrograde pyelogram and a stent was placed as a precaution in the setting of very mild right hydroureteronephrosis down to level of the distal ureter.  Since stent placement, she had no further vaginal leakage. Her most recent CT imaging on 09/11/2016 showed complete resolution of her pelvic abscess. She is been seen and evaluated by Dr. [name] and Dr. Janese Banks with plans of undergoing chemotherapy in the near future.  She presents today for cystoscopy, stent removal.  Blood pressure 101/67, pulse 76, height 5\' 1"  (1.549 m), weight 173 lb (78.5 kg). NED. A&Ox3.   No respiratory distress   Abd soft, NT, ND Normal external genitalia with patent urethral meatus  Cystoscopy/ Stent removal procedure  Patient identification was confirmed, informed consent was obtained, and patient was prepped using Betadine solution.  Lidocaine jelly was administered per urethral meatus.    Preoperative abx where received prior to procedure.    Procedure: - Flexible cystoscope introduced, without any difficulty.   - Thorough search of the bladder revealed:    normal urethral meatus  Stent seen emanating from right ureteral orifice, grasped with stent graspers, and removed in entirety.     Post-Procedure: - Patient tolerated the procedure well  Assessment/ Plan:  1- Endometrial cancer- follow-up with Dr. Jaclynn Major.  2- Vaginal leakage-etiology unclear, resolved with ureteral stent placement. Symptoms remain in place for approximate 6 weeks now. Stent removed today without competition.  Patient was advised to let us know if she develops any further vaginal leakage.  Follow-up in setting of recurrent vaginal leakage  Hollice Espy, MD

## 2016-09-24 NOTE — Progress Notes (Signed)
Subjective:    Patient ID: Jaime Hall, female    DOB: 1951-05-28, 66 y.o.   MRN: [id number] Chief Complaint  Patient presents with  . Re-evaluation    Ultrasound follow up   Patient last seen on 07/20/16 for evaluation of leg swelling. The patient has an extensive recurrent cancer history. She is going to start chemo therapy in the upcoming weeks. Patients lower extremity edema and discomfort is unchanged. The patient first noticed the swelling remotely but is now concerned because of a significant increase in the overall edema. She is also concerned about the swelling from chemotherapy in the upcoming weeks. The swelling is associated with pain and discoloration. The patient notes that in the morning the legs are significantly improved but they steadily worsened throughout the course of the day. Elevation makes the legs better, dependency makes them much worse. Over the last few months, the patient has been wearing medical grade one compression, elevating her legs and remaining active with minimal relief in symptoms requiring the use of OTC medication. The patients edema and discomfort is effecting her ability to function on a daily basis. She underwent a bilateral venous duplex which was notable venous reflux in the left common femoral vein. No DVT or SVT.    Review of Systems  Constitutional: Negative.   HENT: Negative.   Eyes: Negative.   Respiratory: Negative.   Cardiovascular: Positive for leg swelling.       Lower Extremity Pain  Gastrointestinal: Negative.   Endocrine: Negative.   Genitourinary: Negative.   Musculoskeletal: Negative.   Skin: Negative.   Allergic/Immunologic: Negative.   Neurological: Negative.   Hematological: Negative.   Psychiatric/Behavioral: Negative.       Objective:   Physical Exam  Constitutional: She is oriented to person, place, and time. She appears well-developed and well-nourished. No distress.  HENT:  Head: Normocephalic and atraumatic.    Eyes: Conjunctivae are normal. Pupils are equal, round, and reactive to light. Right eye exhibits no discharge.  Neck: Normal range of motion.  Cardiovascular: Normal rate, regular rhythm, normal heart sounds and intact distal pulses.   Pulses:      Radial pulses are 2+ on the right side, and 2+ on the left side.       Dorsalis pedis pulses are 2+ on the right side, and 2+ on the left side.       Posterior tibial pulses are 2+ on the right side, and 2+ on the left side.  Pulmonary/Chest: Effort normal.  Musculoskeletal: Normal range of motion. She exhibits edema (Moderating 1+ Pitting Edema).  Neurological: She is alert and oriented to person, place, and time.  Skin: Skin is warm and dry. She is not diaphoretic.  Psychiatric: She has a normal mood and affect. Her behavior is normal. Judgment and thought content normal.  Vitals reviewed.  BP 132/64   Pulse 71   Resp 16   Ht 5' 3.5" (1.613 m)   Wt 173 lb (78.5 kg)   BMI 30.16 kg/m   Past Medical History:  Diagnosis Date  . Anxiety   . Cervicalgia   . Coronary artery disease    a. 02/2012 Stress echo: severe anterior wall ischemia;  b. 02/2012 Cath/PCI: LAD 95p (3.0 x 15 Xience EX DES), D1 90ost (PTCA - bifurcational dzs), EF 45% with anterior HK;  b. 02/2013 Ex MV: fixed anterior defect w/ minor reversibility, nl EF-->Med Rx.  . Endometrial cancer ([place])    a. 07/2016 s/p robotic hysterectomy, BSO  w/ washings, sentinel node inj, mapping, bx, adhesiolysis.  . Essential hypertension, benign   . Fibrocystic breast disease   . GERD (gastroesophageal reflux disease)   . Gestational hypertension   . Heart murmur   . History of anemia   . History of blood transfusion   . Hodgkin's lymphoma ([place]) 2011   a. s/p radiation and chemo therapy  . Osteoarthritis   . Polycystic ovarian disease    Social History   Social History  . Marital status: Widowed    Spouse name: N/A  . Number of children: N/A  . Years of education: N/A    Occupational History  . Not on file.   Social History Main Topics  . Smoking status: Former Smoker    Packs/day: 1.00    Years: 30.00    Types: Cigarettes    Quit date: 07/24/2002  . Smokeless tobacco: Never Used     Comment: quit smoking in 2000  . Alcohol use Yes     Comment: occasional, 1-2 drinks per month  . Drug use: No  . Sexual activity: No   Other Topics Concern  . Not on file   Social History Narrative  . No narrative on file   Past Surgical History:  Procedure Laterality Date  . ABDOMINAL HYSTERECTOMY    . bladder sling    . CARDIAC CATHETERIZATION  02/2012   [facility] 1 stent place  . CERVICAL POLYPECTOMY    . CHOLECYSTECTOMY  1982  . COLONOSCOPY WITH PROPOFOL N/A 02/05/2015   Procedure: COLONOSCOPY WITH PROPOFOL;  Surgeon: Lucilla Lame, MD;  Location: [facility];  Service: Endoscopy;  Laterality: N/A;  . CORONARY ANGIOPLASTY  02/2012   left/right s/p balloon  . CYSTOGRAM N/A 08/17/2016   Procedure: CYSTOGRAM;  Surgeon: Hollice Espy, MD;  Location: [facility];  Service: Urology;  Laterality: N/A;  . CYSTOSCOPY N/A 08/17/2016   Procedure: CYSTOSCOPY EXAM UNDER ANESTHESIA;  Surgeon: Hollice Espy, MD;  Location: [facility];  Service: Urology;  Laterality: N/A;  . CYSTOSCOPY W/ RETROGRADES Bilateral 08/17/2016   Procedure: CYSTOSCOPY WITH RETROGRADE PYELOGRAM;  Surgeon: Hollice Espy, MD;  Location: [facility];  Service: Urology;  Laterality: Bilateral;  . CYSTOSCOPY WITH STENT PLACEMENT Right 08/17/2016   Procedure: CYSTOSCOPY WITH STENT PLACEMENT;  Surgeon: Hollice Espy, MD;  Location: [facility];  Service: Urology;  Laterality: Right;  . heart stent'  2013  . kidney stent Right 2018  . LYMPH NODE BIOPSY  2011   diagnosis of hodgkins lymphoma  . PELVIC LYMPH NODE DISSECTION N/A 07/29/2016   Procedure: PELVIC/AORTIC LYMPH NODE SAMPLING;  Surgeon: [name], MD;  Location: [facility];  Service: Gynecology;  Laterality: N/A;  . PORTA CATH INSERTION N/A 09/22/2016    Procedure: Glori Luis Cath Insertion;  Surgeon: [name], MD;  Location: [place] [facility];  Service: Cardiovascular;  Laterality: N/A;  . ROBOTIC ASSISTED TOTAL HYSTERECTOMY WITH BILATERAL SALPINGO OOPHERECTOMY N/A 07/29/2016   Procedure: ROBOTIC ASSISTED TOTAL HYSTERECTOMY WITH BILATERAL SALPINGO OOPHORECTOMY;  Surgeon: [name], MD;  Location: [facility];  Service: Gynecology;  Laterality: N/A;  . SENTINEL NODE BIOPSY N/A 07/29/2016   Procedure: SENTINEL NODE BIOPSY;  Surgeon: [name], MD;  Location: [facility];  Service: Gynecology;  Laterality: N/A;  . transobturator sling N/A 2009   [place]   Family History  Problem Relation Age of Onset  . ALS Father   . Polymyositis Father   . Diabetes Brother   . Cancer Maternal Aunt  breast  . Breast cancer Maternal Aunt     30's  . Stroke Maternal Grandmother   . Cancer Maternal Grandfather     prostate  . Stroke Maternal Grandfather    Allergies  Allergen Reactions  . Adhesive [Tape] Other (See Comments)    Burns the skin  . Z-Pak [Azithromycin] Itching  . Antifungal [Miconazole Nitrate] Rash  . Sulfa Antibiotics Nausea And Vomiting and Rash    N/T      Assessment & Plan:  Patient last seen on 07/20/16 for evaluation of leg swelling. The patient has an extensive recurrent cancer history. She is going to start chemo therapy in the upcoming weeks. Patients lower extremity edema and discomfort is unchanged. The patient first noticed the swelling remotely but is now concerned because of a significant increase in the overall edema. She is also concerned about the swelling from chemotherapy in the upcoming weeks. The swelling is associated with pain and discoloration. The patient notes that in the morning the legs are significantly improved but they steadily worsened throughout the course of the day. Elevation makes the legs better, dependency makes them much worse. Over the last few months, the patient has  been wearing medical grade one compression, elevating her legs and remaining active with minimal relief in symptoms requiring the use of OTC medication. The patients edema and discomfort is effecting her ability to function on a daily basis. She underwent a bilateral venous duplex which was notable venous reflux in the left common femoral vein. No DVT or SVT.   1. Chronic venous insufficiency - New In deep system (left common femoral vein) Will order lymphedema pump. Continue compression and elevation for now.  2. Lymphedema - New Despite conservative treatments including exercise, elevation and class one compression stockings, the patient still presents with stage one lymphedema. The patient would greatly benefit from the added therapy of a lymphedema pump. Will apply to insurance. Continue compression and elevation for now.   Current Outpatient Prescriptions on File Prior to Visit  Medication Sig Dispense Refill  . acetaminophen (TYLENOL) 500 MG tablet Take 1,000 mg by mouth every 6 (six) hours as needed.    Marland Kitchen aspirin EC 81 MG tablet Take 81 mg by mouth at bedtime.    Marland Kitchen atorvastatin (LIPITOR) 10 MG tablet TAKE ONE TABLET BY MOUTH ONCE DAILY (Patient taking differently: Take 10 mg by mouth at bedtime. ) 90 tablet 3  . BIOTIN 5000 PO Take 1 Dose by mouth daily.    . Cholecalciferol (VITAMIN D3) 2000 units TABS Take 2,000 Units by mouth 3 (three) times a week.    Marland Kitchen dexamethasone (DECADRON) 4 MG tablet Take 2 tablets (8 mg total) by mouth daily. Start the day after chemotherapy for 2 days. 30 tablet 1  . diazepam (VALIUM) 5 MG tablet Take 1 tablet (5 mg total) by mouth every 12 (twelve) hours as needed for anxiety. 30 tablet 1  . esomeprazole (NEXIUM) 20 MG capsule Take 20 mg by mouth daily before breakfast.     . ibuprofen (ADVIL,MOTRIN) 200 MG tablet Take 200 mg by mouth every 6 (six) hours as needed.    . lidocaine-prilocaine (EMLA) cream Apply to affected area once 30 g 3  . LORazepam  (ATIVAN) 0.5 MG tablet Take 1 tablet (0.5 mg total) by mouth every 6 (six) hours as needed (Nausea or vomiting). 30 tablet 0  . metoprolol tartrate (LOPRESSOR) 25 MG tablet TAKE ONE TABLET BY MOUTH TWICE DAILY 180 tablet 2  .  naproxen sodium (ANAPROX) 220 MG tablet Take 440 mg by mouth 2 (two) times daily as needed (for pain/headache.).    Marland Kitchen nitroGLYCERIN (NITROSTAT) 0.4 MG SL tablet Place 1 tablet (0.4 mg total) under the tongue every 5 (five) minutes as needed for chest pain. 25 tablet 3  . ondansetron (ZOFRAN) 8 MG tablet Take 1 tablet (8 mg total) by mouth 2 (two) times daily as needed for refractory nausea / vomiting. Start on day 3 after chemo. 30 tablet 1  . prochlorperazine (COMPAZINE) 10 MG tablet Take 1 tablet (10 mg total) by mouth every 6 (six) hours as needed (Nausea or vomiting). 30 tablet 1  . Turmeric 500 MG CAPS Take 500 mg by mouth 2 (two) times daily.    . vitamin B-12 (CYANOCOBALAMIN) 1000 MCG tablet Take 1,000 mcg by mouth daily.     No current facility-administered medications on file prior to visit.    There are no Patient Instructions on file for this visit. No Follow-up on file.  Leahanna Buser A Ayodeji Keimig, PA-C

## 2016-09-24 NOTE — Telephone Encounter (Signed)
Called patient to check with her regarding appointment with Vein[facility] this morning regarding the recent placement of her PAC.  Patient was in our office yesterday with pain, redness,  swelling and tenderness in her neck and @ port site.  Patient states she was told today that her port is fine.  The swelling at her neck is due to a hematoma.  The redness is due to bruising.  She was told to give 2-3 days to resolve.

## 2016-09-25 ENCOUNTER — Encounter: Payer: Self-pay | Admitting: Obstetrics and Gynecology

## 2016-09-25 LAB — SURGICAL PATHOLOGY

## 2016-09-28 ENCOUNTER — Encounter: Payer: Self-pay | Admitting: Obstetrics and Gynecology

## 2016-09-29 ENCOUNTER — Inpatient Hospital Stay: Payer: PPO | Admitting: Oncology

## 2016-10-01 ENCOUNTER — Encounter: Payer: Self-pay | Admitting: Oncology

## 2016-10-01 ENCOUNTER — Inpatient Hospital Stay (HOSPITAL_BASED_OUTPATIENT_CLINIC_OR_DEPARTMENT_OTHER): Payer: PPO | Admitting: Oncology

## 2016-10-01 ENCOUNTER — Inpatient Hospital Stay: Payer: PPO

## 2016-10-01 VITALS — BP 130/81 | HR 68 | Temp 94.0°F | Resp 18 | Wt 176.7 lb

## 2016-10-01 VITALS — BP 139/82 | HR 71 | Resp 20

## 2016-10-01 DIAGNOSIS — K219 Gastro-esophageal reflux disease without esophagitis: Secondary | ICD-10-CM

## 2016-10-01 DIAGNOSIS — Z90722 Acquired absence of ovaries, bilateral: Secondary | ICD-10-CM

## 2016-10-01 DIAGNOSIS — R5383 Other fatigue: Secondary | ICD-10-CM | POA: Diagnosis not present

## 2016-10-01 DIAGNOSIS — E785 Hyperlipidemia, unspecified: Secondary | ICD-10-CM

## 2016-10-01 DIAGNOSIS — N736 Female pelvic peritoneal adhesions (postinfective): Secondary | ICD-10-CM

## 2016-10-01 DIAGNOSIS — I890 Lymphedema, not elsewhere classified: Secondary | ICD-10-CM | POA: Diagnosis not present

## 2016-10-01 DIAGNOSIS — I700 Atherosclerosis of aorta: Secondary | ICD-10-CM | POA: Diagnosis not present

## 2016-10-01 DIAGNOSIS — M25552 Pain in left hip: Secondary | ICD-10-CM

## 2016-10-01 DIAGNOSIS — F419 Anxiety disorder, unspecified: Secondary | ICD-10-CM

## 2016-10-01 DIAGNOSIS — C541 Malignant neoplasm of endometrium: Secondary | ICD-10-CM | POA: Diagnosis not present

## 2016-10-01 DIAGNOSIS — Z87891 Personal history of nicotine dependence: Secondary | ICD-10-CM

## 2016-10-01 DIAGNOSIS — I739 Peripheral vascular disease, unspecified: Secondary | ICD-10-CM | POA: Diagnosis not present

## 2016-10-01 DIAGNOSIS — R079 Chest pain, unspecified: Secondary | ICD-10-CM

## 2016-10-01 DIAGNOSIS — Z86010 Personal history of colonic polyps: Secondary | ICD-10-CM

## 2016-10-01 DIAGNOSIS — Z79899 Other long term (current) drug therapy: Secondary | ICD-10-CM

## 2016-10-01 DIAGNOSIS — Z90710 Acquired absence of both cervix and uterus: Secondary | ICD-10-CM

## 2016-10-01 DIAGNOSIS — Z808 Family history of malignant neoplasm of other organs or systems: Secondary | ICD-10-CM

## 2016-10-01 DIAGNOSIS — Z5111 Encounter for antineoplastic chemotherapy: Secondary | ICD-10-CM

## 2016-10-01 DIAGNOSIS — Z7982 Long term (current) use of aspirin: Secondary | ICD-10-CM

## 2016-10-01 DIAGNOSIS — I10 Essential (primary) hypertension: Secondary | ICD-10-CM

## 2016-10-01 DIAGNOSIS — M722 Plantar fascial fibromatosis: Secondary | ICD-10-CM | POA: Diagnosis not present

## 2016-10-01 DIAGNOSIS — M25551 Pain in right hip: Secondary | ICD-10-CM

## 2016-10-01 DIAGNOSIS — R05 Cough: Secondary | ICD-10-CM | POA: Diagnosis not present

## 2016-10-01 DIAGNOSIS — R918 Other nonspecific abnormal finding of lung field: Secondary | ICD-10-CM | POA: Diagnosis not present

## 2016-10-01 DIAGNOSIS — G629 Polyneuropathy, unspecified: Secondary | ICD-10-CM

## 2016-10-01 DIAGNOSIS — E669 Obesity, unspecified: Secondary | ICD-10-CM

## 2016-10-01 DIAGNOSIS — R1310 Dysphagia, unspecified: Secondary | ICD-10-CM | POA: Diagnosis not present

## 2016-10-01 DIAGNOSIS — R531 Weakness: Secondary | ICD-10-CM

## 2016-10-01 DIAGNOSIS — Z8571 Personal history of Hodgkin lymphoma: Secondary | ICD-10-CM

## 2016-10-01 DIAGNOSIS — M1990 Unspecified osteoarthritis, unspecified site: Secondary | ICD-10-CM

## 2016-10-01 DIAGNOSIS — Z9221 Personal history of antineoplastic chemotherapy: Secondary | ICD-10-CM

## 2016-10-01 DIAGNOSIS — I2510 Atherosclerotic heart disease of native coronary artery without angina pectoris: Secondary | ICD-10-CM

## 2016-10-01 LAB — CBC WITH DIFFERENTIAL/PLATELET
Basophils Absolute: 0.1 10*3/uL (ref 0–0.1)
Basophils Relative: 1 %
Eosinophils Absolute: 0.1 10*3/uL (ref 0–0.7)
Eosinophils Relative: 2 %
HCT: 37 % (ref 35.0–47.0)
Hemoglobin: 12.6 g/dL (ref 12.0–16.0)
Lymphocytes Relative: 17 %
Lymphs Abs: 1.1 10*3/uL (ref 1.0–3.6)
MCH: 30.1 pg (ref 26.0–34.0)
MCHC: 34 g/dL (ref 32.0–36.0)
MCV: 88.4 fL (ref 80.0–100.0)
Monocytes Absolute: 0.5 10*3/uL (ref 0.2–0.9)
Monocytes Relative: 8 %
Neutro Abs: 4.7 10*3/uL (ref 1.4–6.5)
Neutrophils Relative %: 72 %
Platelets: 193 10*3/uL (ref 150–440)
RBC: 4.18 MIL/uL (ref 3.80–5.20)
RDW: 14 % (ref 11.5–14.5)
WBC: 6.4 10*3/uL (ref 3.6–11.0)

## 2016-10-01 LAB — COMPREHENSIVE METABOLIC PANEL
ALT: 18 U/L (ref 14–54)
AST: 25 U/L (ref 15–41)
Albumin: 3.8 g/dL (ref 3.5–5.0)
Alkaline Phosphatase: 70 U/L (ref 38–126)
Anion gap: 3 — ABNORMAL LOW (ref 5–15)
BUN: 17 mg/dL (ref 6–20)
CO2: 27 mmol/L (ref 22–32)
Calcium: 9.1 mg/dL (ref 8.9–10.3)
Chloride: 108 mmol/L (ref 101–111)
Creatinine, Ser: 0.92 mg/dL (ref 0.44–1.00)
GFR calc Af Amer: >60 mL/min (ref >60)
GFR calc non Af Amer: >60 mL/min (ref >60)
Glucose, Bld: 112 mg/dL — ABNORMAL HIGH (ref 65–99)
Potassium: 4.4 mmol/L (ref 3.5–5.1)
Sodium: 138 mmol/L (ref 135–145)
Total Bilirubin: 0.8 mg/dL (ref 0.3–1.2)
Total Protein: 6.6 g/dL (ref 6.5–8.1)

## 2016-10-01 MED ORDER — SODIUM CHLORIDE 0.9 % IV SOLN
20.0000 mg | Freq: Once | INTRAVENOUS | Status: AC
  Administered 2016-10-01: 20 mg via INTRAVENOUS
  Filled 2016-10-01: qty 2

## 2016-10-01 MED ORDER — DIPHENHYDRAMINE HCL 50 MG/ML IJ SOLN
50.0000 mg | Freq: Once | INTRAMUSCULAR | Status: AC
  Administered 2016-10-01: 50 mg via INTRAVENOUS
  Filled 2016-10-01: qty 1

## 2016-10-01 MED ORDER — SODIUM CHLORIDE 0.9% FLUSH
10.0000 mL | INTRAVENOUS | Status: DC | PRN
  Administered 2016-10-01: 10 mL via INTRAVENOUS
  Filled 2016-10-01: qty 10

## 2016-10-01 MED ORDER — SODIUM CHLORIDE 0.9 % IV SOLN
175.0000 mg/m2 | Freq: Once | INTRAVENOUS | Status: AC
  Administered 2016-10-01: 330 mg via INTRAVENOUS
  Filled 2016-10-01: qty 55

## 2016-10-01 MED ORDER — HEPARIN SOD (PORK) LOCK FLUSH 100 UNIT/ML IV SOLN
500.0000 [IU] | Freq: Once | INTRAVENOUS | Status: AC
  Administered 2016-10-01: 500 [IU] via INTRAVENOUS
  Filled 2016-10-01: qty 5

## 2016-10-01 MED ORDER — PEGFILGRASTIM 6 MG/0.6ML ~~LOC~~ PSKT
6.0000 mg | PREFILLED_SYRINGE | Freq: Once | SUBCUTANEOUS | Status: AC
  Administered 2016-10-01: 6 mg via SUBCUTANEOUS
  Filled 2016-10-01: qty 0.6

## 2016-10-01 MED ORDER — PALONOSETRON HCL INJECTION 0.25 MG/5ML
0.2500 mg | Freq: Once | INTRAVENOUS | Status: AC
  Administered 2016-10-01: 0.25 mg via INTRAVENOUS
  Filled 2016-10-01: qty 5

## 2016-10-01 MED ORDER — FAMOTIDINE IN NACL 20-0.9 MG/50ML-% IV SOLN
20.0000 mg | Freq: Once | INTRAVENOUS | Status: AC
Start: 2016-10-01 — End: 2016-10-01
  Administered 2016-10-01: 20 mg via INTRAVENOUS
  Filled 2016-10-01: qty 50

## 2016-10-01 MED ORDER — SODIUM CHLORIDE 0.9 % IV SOLN
471.0000 mg | Freq: Once | INTRAVENOUS | Status: AC
  Administered 2016-10-01: 470 mg via INTRAVENOUS
  Filled 2016-10-01: qty 47

## 2016-10-01 MED ORDER — SODIUM CHLORIDE 0.9 % IV SOLN
Freq: Once | INTRAVENOUS | Status: AC
  Administered 2016-10-01: 10:00:00 via INTRAVENOUS
  Filled 2016-10-01: qty 1000

## 2016-10-01 NOTE — Progress Notes (Signed)
Here before day one of chemo

## 2016-10-01 NOTE — Progress Notes (Signed)
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 patient is a 66 year old female who was seen by Dr. [name] for grade 1 endometrial cancerwhen she presented with postmenopausal bleeding in February 2018. She underwent robotic hysterectomy and bilateral salpingo-oophorectomy with washings and sentinel lymph node injection and mapping and biopsy on 07/29/2016. She was found to have extensive pelvic adhesive disease and underwent elevated pelvic adhesiolysis including enteric and ovariolysis. The cul de sac was obliterated and adherent to posterior cervix.-Was negative.  2. Pathology showed: Pathology DIAGNOSIS:  A. SENTINEL LYMPH NODE, RIGHT MID OBTURATOR; EXCISION:  - NO TUMOR SEEN IN ONE LYMPH NODE (0/1).   B. SENTINEL LYMPH NODE, RIGHT EXTERNAL ILIAC; EXCISION:  - NO TUMOR SEEN IN ONE LYMPH NODE (0/1).   C. SENTINEL LYMPH NODE, LEFT EXTERNAL ILIAC; EXCISION:  - NO TUMOR SEEN IN ONE LYMPH NODE (0/1).   D. UTERUS WITH CERVIX, BILATERAL FALLOPIAN TUBES AND OVARIES;  HYSTERECTOMY AND BILATERAL SALPINGO-OOPHORECTOMY:  - SEROUS CARCINOMA, 4.5 CM(SEE NOTE).  - LYMPHOVASCULAR INVASION PRESENT.  - NO TUMOR SEEN IN BILATERAL OVARIES AND FALLOPIAN TUBES.    ENDOMETRIUM:  Procedure: Hysterectomy with bilateral  salpingo-oophorectomy  Histologic Type: Serous carcinoma  Myometrial Invasion: Present  Depth of invasion (millimeters): 13 mm  Myometrial thickness (millimeters): 14 mm  Percentage of myometrial invasion: 93%  Uterine Serosa Involvement: Not identified  Cervical Stromal Involvement: Not identified   BIOMARKER REPORTING TEMPLATE:  p53 Expression: Abnormal strong diffuse overexpression (>90%)  ER/PR negative. Her2 pending MS stable  3. She presented with fever on 08/05/2016 and CT scan showed 1.8 x 3.8 cm complex fluid collection containing pockets of air within somewhat organized walls within the pelvis and the hysterectomy bed most consistent with a developing abscess. There is a superior extension of the loculated fluid along the left lateral pelvic wall anterior to the left psoas muscle measuring 2 x 4 cm and another fluid collection along the anterior surface of the left psoas muscle measuring 1.8 x 3.6 cm. Blood cultures were positive for bacteria growing his fragilis. Urine cultures were negative. Patient was treated with IV antibiotics and discharged on Augmentin.  4.  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 [name] from urology on 08/14/2016. No leak was seen on CT urogram.  5. On 08/17/2016 patient underwentExam under anesthesia, vaginoscopy, cystoscopy, cystogram with interpretation of fluoroscopy less than 30 minutes, bilateral retrograde pyelogram, and right ureteral stent placement. Intraoperative findings: No obvious ureterovaginal vesicovaginal fistula appreciated. Mild right hydroureteronephrosis down to level of the distal ureter with slight medial deviation without filling defects. Slight mucopurulent drainage from left apical vaginal cuff, specimen cultured. Culture revealed bateroides fragilis and she was started  on a 2 week course of Flagyl iniated on 08/24/2016 which she has  now completed  6. Repeat CT abdomen on IMPRESSION: 1. Interval resolution of abscess involving the vaginal cuff. 2. The proximal end of the right ureteral stent is coiled in the proximal right ureter. No hydronephrosis. 3. No evidence of metastatic disease or other significant changes. 4. Aortic atherosclerosis   7. Patient has been recovering slowly following her complicated postoperative course. Currently she reports no fevers or vaginal leakage. Does report some fatigue but denies other complaints. She lives alone and is independent of her ADLs and IADLs. She has a prior history of Hodgkin's lymphoma in 2011 treated with anthracycline-based chemotherapy  8. There was concern about port infection and was looked at by vascular surgery. They did not think it was infected and recommend conservative management. I also spoke to Dr. [name] and plan is to proceed with 3 cycles of chemotherapy followed by WPRT    Interval history- she is doing well. Reports no fevers. brusiging around port site is better. She still has pain but is improving  ECOG PS- 1 Pain scale- 3 Opioid associated constipation- no  Review of systems- Review of Systems  Constitutional: Negative for chills, fever, malaise/fatigue and weight loss.  HENT: Negative for congestion, ear discharge and nosebleeds.   Eyes: Negative for blurred vision.  Respiratory: Negative for cough, hemoptysis, sputum production, shortness of breath and wheezing.        Pain around port site  Cardiovascular: Negative for chest pain, palpitations, orthopnea and claudication.  Gastrointestinal: Negative for abdominal pain, blood in stool, constipation, diarrhea, heartburn, melena, nausea and vomiting.  Genitourinary: Negative for dysuria, flank pain, frequency, hematuria and urgency.  Musculoskeletal: Negative for back pain, joint pain and myalgias.  Skin: Negative for rash.    Neurological: Negative for dizziness, tingling, focal weakness, seizures, weakness and headaches.  Endo/Heme/Allergies: Does not bruise/bleed easily.  Psychiatric/Behavioral: Negative for depression and suicidal ideas. The patient does not have insomnia.        Allergies  Allergen Reactions  . Adhesive [Tape] Other (See Comments)    Burns the skin  . Z-Pak [Azithromycin] Itching  . Antifungal [Miconazole Nitrate] Rash  . Sulfa Antibiotics Nausea And Vomiting and Rash    N/T     Past Medical History:  Diagnosis Date  . Anxiety   . Cervicalgia   . Coronary artery disease    a. 02/2012 Stress echo: severe anterior wall ischemia;  b. 02/2012 Cath/PCI: LAD 95p (3.0 x 15 Xience EX DES), D1 90ost (PTCA - bifurcational dzs), EF 45% with anterior HK;  b. 02/2013 Ex MV: fixed anterior defect w/ minor reversibility, nl EF-->Med Rx.  . Endometrial cancer ([place])    a. 07/2016 s/p robotic hysterectomy, BSO w/ washings, sentinel node inj, mapping, bx, adhesiolysis.  . Essential hypertension, benign   . Fibrocystic breast disease   . GERD (gastroesophageal reflux disease)   . Gestational hypertension   . Heart murmur   . History of anemia   . History of blood transfusion   . Hodgkin's lymphoma (Grandfather) 2011   a. s/p radiation and chemo therapy  . Osteoarthritis   . Polycystic ovarian disease      Past Surgical History:  Procedure Laterality Date  . ABDOMINAL HYSTERECTOMY    . bladder sling    . CARDIAC CATHETERIZATION  02/2012   [facility] 1 stent place  . CERVICAL POLYPECTOMY    . CHOLECYSTECTOMY  1982  . COLONOSCOPY WITH PROPOFOL N/A 02/05/2015   Procedure: COLONOSCOPY WITH PROPOFOL;  Surgeon: Lucilla Lame, MD;  Location:  [facility];  Service: Endoscopy;  Laterality: N/A;  . CORONARY ANGIOPLASTY  02/2012   left/right s/p balloon  . CYSTOGRAM N/A 08/17/2016   Procedure: CYSTOGRAM;  Surgeon: Hollice Espy, MD;  Location: [facility];  Service: Urology;  Laterality: N/A;  . CYSTOSCOPY N/A  08/17/2016   Procedure: CYSTOSCOPY EXAM UNDER ANESTHESIA;  Surgeon: Hollice Espy, MD;  Location: [facility];  Service: Urology;  Laterality: N/A;  . CYSTOSCOPY W/ RETROGRADES Bilateral 08/17/2016   Procedure: CYSTOSCOPY WITH RETROGRADE PYELOGRAM;  Surgeon: Hollice Espy, MD;  Location: [facility];  Service: Urology;  Laterality: Bilateral;  . CYSTOSCOPY WITH STENT PLACEMENT Right 08/17/2016   Procedure: CYSTOSCOPY WITH STENT PLACEMENT;  Surgeon: Hollice Espy, MD;  Location: [facility];  Service: Urology;  Laterality: Right;  . heart stent'  2013  . kidney stent Right 2018  . LYMPH NODE BIOPSY  2011   diagnosis of hodgkins lymphoma  . PELVIC LYMPH NODE DISSECTION N/A 07/29/2016   Procedure: PELVIC/AORTIC LYMPH NODE SAMPLING;  Surgeon: [name], MD;  Location: [facility];  Service: Gynecology;  Laterality: N/A;  . PORTA CATH INSERTION N/A 09/22/2016   Procedure: Glori Luis Cath Insertion;  Surgeon: [name], MD;  Location: [facility];  Service: Cardiovascular;  Laterality: N/A;  . ROBOTIC ASSISTED TOTAL HYSTERECTOMY WITH BILATERAL SALPINGO OOPHERECTOMY N/A 07/29/2016   Procedure: ROBOTIC ASSISTED TOTAL HYSTERECTOMY WITH BILATERAL SALPINGO OOPHORECTOMY;  Surgeon: [name], MD;  Location: [facility];  Service: Gynecology;  Laterality: N/A;  . SENTINEL NODE BIOPSY N/A 07/29/2016   Procedure: SENTINEL NODE BIOPSY;  Surgeon: [name], MD;  Location: [facility];  Service: Gynecology;  Laterality: N/A;  . transobturator sling N/A 2009   [place] History   Social History  . Marital status: Widowed    Spouse name: N/A  . Number of children: N/A  . Years of education: N/A   Occupational History  . Not on file.   Social History Main Topics  . Smoking status: Former Smoker    Packs/day: 1.00    Years: 30.00    Types: Cigarettes    Quit date: 07/24/2002  . Smokeless tobacco: Never Used     Comment: quit smoking in 2000  . Alcohol use Yes      Comment: occasional, 1-2 drinks per month  . Drug use: No  . Sexual activity: No   Other Topics Concern  . Not on file   Social History Narrative  . No narrative on file    Family History  Problem Relation Age of Onset  . ALS Father   . Polymyositis Father   . Diabetes Brother   . Cancer Maternal Aunt     breast  . Breast cancer Maternal Aunt     30's  . Stroke Maternal Grandmother   . Cancer Maternal Grandfather     prostate  . Stroke Maternal Grandfather      Current Outpatient Prescriptions:  .  acetaminophen (TYLENOL) 500 MG tablet, Take 1,000 mg by mouth every 6 (six) hours as needed., Disp: , Rfl:  .  aspirin EC 81 MG tablet, Take 81 mg by mouth at bedtime., Disp: , Rfl:  .  atorvastatin (LIPITOR) 10 MG tablet, TAKE ONE TABLET BY MOUTH ONCE DAILY (Patient taking differently: Take 10 mg by mouth at bedtime. ), Disp: 90 tablet, Rfl: 3 .  BIOTIN 5000 PO, Take 1 Dose by mouth daily., Disp: , Rfl:  .  Cholecalciferol (VITAMIN D3) 2000 units TABS,  Take 2,000 Units by mouth 3 (three) times a week., Disp: , Rfl:  .  esomeprazole (NEXIUM) 20 MG capsule, Take 20 mg by mouth daily before breakfast. , Disp: , Rfl:  .  ibuprofen (ADVIL,MOTRIN) 200 MG tablet, Take 200 mg by mouth every 6 (six) hours as needed., Disp: , Rfl:  .  lidocaine-prilocaine (EMLA) cream, Apply to affected area once, Disp: 30 g, Rfl: 3 .  LORazepam (ATIVAN) 0.5 MG tablet, Take 1 tablet (0.5 mg total) by mouth every 6 (six) hours as needed (Nausea or vomiting)., Disp: 30 tablet, Rfl: 0 .  metoprolol tartrate (LOPRESSOR) 25 MG tablet, TAKE ONE TABLET BY MOUTH TWICE DAILY, Disp: 180 tablet, Rfl: 2 .  naproxen sodium (ANAPROX) 220 MG tablet, Take 440 mg by mouth 2 (two) times daily as needed (for pain/headache.)., Disp: , Rfl:  .  dexamethasone (DECADRON) 4 MG tablet, Take 2 tablets (8 mg total) by mouth daily. Start the day after chemotherapy for 2 days. (Patient not taking: Reported on 10/01/2016), Disp: 30 tablet,  Rfl: 1 .  diazepam (VALIUM) 5 MG tablet, Take 1 tablet (5 mg total) by mouth every 12 (twelve) hours as needed for anxiety. (Patient not taking: Reported on 10/01/2016), Disp: 30 tablet, Rfl: 1 .  metroNIDAZOLE (FLAGYL) 500 MG tablet, , Disp: , Rfl:  .  nitroGLYCERIN (NITROSTAT) 0.4 MG SL tablet, Place 1 tablet (0.4 mg total) under the tongue every 5 (five) minutes as needed for chest pain. (Patient not taking: Reported on 10/01/2016), Disp: 25 tablet, Rfl: 3 .  ondansetron (ZOFRAN) 8 MG tablet, Take 1 tablet (8 mg total) by mouth 2 (two) times daily as needed for refractory nausea / vomiting. Start on day 3 after chemo. (Patient not taking: Reported on 10/01/2016), Disp: 30 tablet, Rfl: 1 .  oxyCODONE (OXY IR/ROXICODONE) 5 MG immediate release tablet, , Disp: , Rfl:  .  prochlorperazine (COMPAZINE) 10 MG tablet, Take 1 tablet (10 mg total) by mouth every 6 (six) hours as needed (Nausea or vomiting). (Patient not taking: Reported on 10/01/2016), Disp: 30 tablet, Rfl: 1 .  Turmeric 500 MG CAPS, Take 500 mg by mouth 2 (two) times daily., Disp: , Rfl:  .  vitamin B-12 (CYANOCOBALAMIN) 1000 MCG tablet, Take 1,000 mcg by mouth daily., Disp: , Rfl:  No current facility-administered medications for this visit.   Facility-Administered Medications Ordered in Other Visits:  .  0.9 %  sodium chloride infusion, , Intravenous, Once, Sindy Guadeloupe, MD .  CARBOplatin (PARAPLATIN) 470 mg in sodium chloride 0.9 % 250 mL chemo infusion, 470 mg, Intravenous, Once, Sindy Guadeloupe, MD .  dexamethasone (DECADRON) 20 mg in sodium chloride 0.9 % 50 mL IVPB, 20 mg, Intravenous, Once, Sindy Guadeloupe, MD .  diphenhydrAMINE (BENADRYL) injection 50 mg, 50 mg, Intravenous, Once, Sindy Guadeloupe, MD .  famotidine (PEPCID) IVPB 20 mg premix, 20 mg, Intravenous, Once, Sindy Guadeloupe, MD .  heparin lock flush 100 unit/mL, 500 Units, Intravenous, Once, Sindy Guadeloupe, MD .  PACLitaxel (TAXOL) 330 mg in dextrose 5 % 500 mL chemo infusion (>  90m/m2), 175 mg/m2 (Treatment Plan Recorded), Intravenous, Once, ASindy Guadeloupe MD .  palonosetron (ALOXI) injection 0.25 mg, 0.25 mg, Intravenous, Once, ASindy Guadeloupe MD .  pegfilgrastim (NEULASTA ONPRO KIT) injection 6 mg, 6 mg, Subcutaneous, Once, ASindy Guadeloupe MD .  sodium chloride flush (NS) 0.9 % injection 10 mL, 10 mL, Intravenous, PRN, ASindy Guadeloupe MD, 10 mL at 10/01/16 [phone]  Physical exam:  Vitals:   10/01/16 0830  BP: 130/81  Pulse: 68  Resp: 18  Temp: (!) 94 F (34.4 C)  TempSrc: Tympanic  Weight: 176 lb 11.2 oz (80.2 kg)   Physical Exam  Constitutional: She is oriented to person, place, and time and well-developed, well-nourished, and in no distress.  HENT:  Head: Normocephalic and atraumatic.  Eyes: EOM are normal. Pupils are equal, round, and reactive to light.  Neck: Normal range of motion.  Cardiovascular: Normal rate, regular rhythm and normal heart sounds.   Pulmonary/Chest: Effort normal and breath sounds normal.  Port site appears well healed. No significant hematoma around it. There is some induration in the left neck above the clavicle indicative of healing hematoma  Abdominal: Soft. Bowel sounds are normal.  Neurological: She is alert and oriented to person, place, and time.  Skin: Skin is warm and dry.     CMP Latest Ref Rng & Units 10/01/2016  Glucose 65 - 99 mg/dL 112(H)  BUN 6 - 20 mg/dL 17  Creatinine 0.44 - 1.00 mg/dL 0.92  Sodium 135 - 145 mmol/L 138  Potassium 3.5 - 5.1 mmol/L 4.4  Chloride 101 - 111 mmol/L 108  CO2 22 - 32 mmol/L 27  Calcium 8.9 - 10.3 mg/dL 9.1  Total Protein 6.5 - 8.1 g/dL 6.6  Total Bilirubin 0.3 - 1.2 mg/dL 0.8  Alkaline Phos 38 - 126 U/L 70  AST 15 - 41 U/L 25  ALT 14 - 54 U/L 18   CBC Latest Ref Rng & Units 10/01/2016  WBC 3.6 - 11.0 K/uL 6.4  Hemoglobin 12.0 - 16.0 g/dL 12.6  Hematocrit 35.0 - 47.0 % 37.0  Platelets 150 - 440 K/uL 193    No images are attached to the encounter.  Ct Abdomen Pelvis W  Contrast  Result Date: 09/11/2016 CLINICAL DATA:  Endometrial cancer status post total hysterectomy. Hodgkin's lymphoma in 2011. No current complaints. EXAM: CT ABDOMEN AND PELVIS WITH CONTRAST TECHNIQUE: Multidetector CT imaging of the abdomen and pelvis was performed using the standard protocol following bolus administration of intravenous contrast. CONTRAST:  140m ISOVUE-300 IOPAMIDOL (ISOVUE-300) INJECTION 61% COMPARISON:  CT 08/13/2016 and 08/05/2016. FINDINGS: Lower chest: Clear lung bases. No significant pleural or pericardial effusion. Hepatobiliary: No focal hepatic abnormalities are demonstrated. There is stable mild biliary dilatation status post cholecystectomy, within physiologic limits. Pancreas: Unremarkable. No pancreatic ductal dilatation or surrounding inflammatory changes. Spleen: Normal in size without focal abnormality. Adrenals/Urinary Tract: Both adrenal glands appear normal. There are stable low-density renal lesions bilaterally, largest in the lower pole of the right kidney consistent with a simple cyst. There has been interval placement of a right ureteral stent. Superiorly, this stent is coiled in the proximal right ureter. The distal end of the stent is within the bladder. There is no evidence of hydronephrosis or delay in contrast excretion. No bladder lesions are evident. There is no air within the urinary bladder lumen in this patient with a history of repaired vaginal fistula. Stomach/Bowel: No evidence of bowel wall thickening, distention or surrounding inflammatory change. The appendix appears normal. There is moderate stool throughout the colon. Vascular/Lymphatic: There are no enlarged abdominal or pelvic lymph nodes. Stable aortic and branch vessel atherosclerosis. Reproductive: Hysterectomy. No evidence of adnexal mass. There is no evidence of residual fluid collection, inflammation or mass in the region of the vaginal cuff. Other: No ascites or peritoneal nodularity. Stable  postsurgical changes in the anterior abdominal wall. Musculoskeletal: No acute or significant osseous  findings. Grade 1 degenerative anterolisthesis at L4-5. IMPRESSION: 1. Interval resolution of abscess involving the vaginal cuff. 2. The proximal end of the right ureteral stent is coiled in the proximal right ureter. No hydronephrosis. 3. No evidence of metastatic disease or other significant changes. 4. Aortic atherosclerosis. Electronically Signed   By: Richardean Sale M.D.   On: 09/11/2016 08:16     Assessment and plan- Patient is a 66 y.o. 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 Counts ok to proceed to cycle # 1 of chemotherapy with carboplatin AUC 5 and taxol 175 mg/meter square today with onpro neulasta today. Port site does not look infected. I will see her back in 1 week to see how she tolerated her cycle 1 with labs. She knows to take tylenol and claritin for neulasta associated bone pain   Visit Diagnosis 1. Endometrial cancer ([place])   2. Encounter for antineoplastic chemotherapy      Dr. Randa Evens, MD, MPH [facility], [place] at [facility] Pager- [phone] 10/01/2016 9:10 AM

## 2016-10-02 ENCOUNTER — Encounter: Payer: Self-pay | Admitting: Obstetrics and Gynecology

## 2016-10-02 LAB — CA 125: CA 125: 12.9 U/mL (ref 0.0–38.1)

## 2016-10-06 ENCOUNTER — Telehealth: Payer: Self-pay | Admitting: *Deleted

## 2016-10-06 NOTE — Telephone Encounter (Signed)
Advised patient of using NSAID short term as she has not tried anything other than the Claritin. She stated she will try Aleve for a couple of days and continue the Claritin. She will take Tylenol in between if needed and let us know if no improvemant

## 2016-10-06 NOTE — Telephone Encounter (Signed)
Likely neulasta associated bone pain. Has she tried tylenol, claritin and occasional NSAID? If yes, we could try prn oxycodone short term but may not help

## 2016-10-06 NOTE — Telephone Encounter (Signed)
Called to request something for pain, states her legs are "killing me". Also requesting an appt for a massage and was told she would have to have the nurse set that up. Please advise

## 2016-10-08 ENCOUNTER — Inpatient Hospital Stay (HOSPITAL_BASED_OUTPATIENT_CLINIC_OR_DEPARTMENT_OTHER): Payer: PPO | Admitting: Oncology

## 2016-10-08 ENCOUNTER — Inpatient Hospital Stay: Payer: PPO

## 2016-10-08 ENCOUNTER — Encounter: Payer: Self-pay | Admitting: Oncology

## 2016-10-08 VITALS — BP 108/72 | HR 93 | Temp 97.3°F | Resp 18 | Wt 175.0 lb

## 2016-10-08 DIAGNOSIS — I739 Peripheral vascular disease, unspecified: Secondary | ICD-10-CM

## 2016-10-08 DIAGNOSIS — M1990 Unspecified osteoarthritis, unspecified site: Secondary | ICD-10-CM

## 2016-10-08 DIAGNOSIS — G4700 Insomnia, unspecified: Secondary | ICD-10-CM | POA: Diagnosis not present

## 2016-10-08 DIAGNOSIS — C541 Malignant neoplasm of endometrium: Secondary | ICD-10-CM

## 2016-10-08 DIAGNOSIS — E785 Hyperlipidemia, unspecified: Secondary | ICD-10-CM

## 2016-10-08 DIAGNOSIS — Z7689 Persons encountering health services in other specified circumstances: Secondary | ICD-10-CM

## 2016-10-08 DIAGNOSIS — Z90722 Acquired absence of ovaries, bilateral: Secondary | ICD-10-CM

## 2016-10-08 DIAGNOSIS — Z90710 Acquired absence of both cervix and uterus: Secondary | ICD-10-CM

## 2016-10-08 DIAGNOSIS — R1310 Dysphagia, unspecified: Secondary | ICD-10-CM

## 2016-10-08 DIAGNOSIS — R5383 Other fatigue: Secondary | ICD-10-CM | POA: Diagnosis not present

## 2016-10-08 DIAGNOSIS — K5900 Constipation, unspecified: Secondary | ICD-10-CM | POA: Diagnosis not present

## 2016-10-08 DIAGNOSIS — Z7982 Long term (current) use of aspirin: Secondary | ICD-10-CM

## 2016-10-08 DIAGNOSIS — Z08 Encounter for follow-up examination after completed treatment for malignant neoplasm: Secondary | ICD-10-CM

## 2016-10-08 DIAGNOSIS — R918 Other nonspecific abnormal finding of lung field: Secondary | ICD-10-CM

## 2016-10-08 DIAGNOSIS — Z86010 Personal history of colonic polyps: Secondary | ICD-10-CM

## 2016-10-08 DIAGNOSIS — N736 Female pelvic peritoneal adhesions (postinfective): Secondary | ICD-10-CM

## 2016-10-08 DIAGNOSIS — R079 Chest pain, unspecified: Secondary | ICD-10-CM

## 2016-10-08 DIAGNOSIS — I700 Atherosclerosis of aorta: Secondary | ICD-10-CM

## 2016-10-08 DIAGNOSIS — I890 Lymphedema, not elsewhere classified: Secondary | ICD-10-CM | POA: Diagnosis not present

## 2016-10-08 DIAGNOSIS — G893 Neoplasm related pain (acute) (chronic): Secondary | ICD-10-CM | POA: Diagnosis not present

## 2016-10-08 DIAGNOSIS — F419 Anxiety disorder, unspecified: Secondary | ICD-10-CM | POA: Diagnosis not present

## 2016-10-08 DIAGNOSIS — Z8571 Personal history of Hodgkin lymphoma: Secondary | ICD-10-CM

## 2016-10-08 DIAGNOSIS — Z79899 Other long term (current) drug therapy: Secondary | ICD-10-CM

## 2016-10-08 DIAGNOSIS — M722 Plantar fascial fibromatosis: Secondary | ICD-10-CM

## 2016-10-08 DIAGNOSIS — Z87891 Personal history of nicotine dependence: Secondary | ICD-10-CM

## 2016-10-08 DIAGNOSIS — R531 Weakness: Secondary | ICD-10-CM | POA: Diagnosis not present

## 2016-10-08 DIAGNOSIS — M25551 Pain in right hip: Secondary | ICD-10-CM

## 2016-10-08 DIAGNOSIS — K219 Gastro-esophageal reflux disease without esophagitis: Secondary | ICD-10-CM

## 2016-10-08 DIAGNOSIS — Z9221 Personal history of antineoplastic chemotherapy: Secondary | ICD-10-CM

## 2016-10-08 DIAGNOSIS — I2510 Atherosclerotic heart disease of native coronary artery without angina pectoris: Secondary | ICD-10-CM

## 2016-10-08 DIAGNOSIS — G629 Polyneuropathy, unspecified: Secondary | ICD-10-CM

## 2016-10-08 DIAGNOSIS — M25552 Pain in left hip: Secondary | ICD-10-CM

## 2016-10-08 DIAGNOSIS — E669 Obesity, unspecified: Secondary | ICD-10-CM

## 2016-10-08 DIAGNOSIS — R05 Cough: Secondary | ICD-10-CM

## 2016-10-08 DIAGNOSIS — I10 Essential (primary) hypertension: Secondary | ICD-10-CM

## 2016-10-08 DIAGNOSIS — Z5111 Encounter for antineoplastic chemotherapy: Secondary | ICD-10-CM | POA: Diagnosis not present

## 2016-10-08 LAB — CBC WITH DIFFERENTIAL/PLATELET
Basophils Absolute: 0 10*3/uL (ref 0–0.1)
Basophils Relative: 0 %
Eosinophils Absolute: 0.2 10*3/uL (ref 0–0.7)
Eosinophils Relative: 10 %
HCT: 40.7 % (ref 35.0–47.0)
Hemoglobin: 13.8 g/dL (ref 12.0–16.0)
Lymphocytes Relative: 41 %
Lymphs Abs: 0.9 10*3/uL — ABNORMAL LOW (ref 1.0–3.6)
MCH: 30.1 pg (ref 26.0–34.0)
MCHC: 34 g/dL (ref 32.0–36.0)
MCV: 88.4 fL (ref 80.0–100.0)
Monocytes Absolute: 0.4 10*3/uL (ref 0.2–0.9)
Monocytes Relative: 21 %
Neutro Abs: 0.6 10*3/uL — ABNORMAL LOW (ref 1.4–6.5)
Neutrophils Relative %: 28 %
Platelets: 95 10*3/uL — ABNORMAL LOW (ref 150–440)
RBC: 4.6 MIL/uL (ref 3.80–5.20)
RDW: 14.1 % (ref 11.5–14.5)
WBC: 2.1 10*3/uL — ABNORMAL LOW (ref 3.6–11.0)

## 2016-10-08 LAB — COMPREHENSIVE METABOLIC PANEL
ALT: 30 U/L (ref 14–54)
AST: 19 U/L (ref 15–41)
Albumin: 3.6 g/dL (ref 3.5–5.0)
Alkaline Phosphatase: 76 U/L (ref 38–126)
Anion gap: 5 (ref 5–15)
BUN: 26 mg/dL — ABNORMAL HIGH (ref 6–20)
CO2: 29 mmol/L (ref 22–32)
Calcium: 9.1 mg/dL (ref 8.9–10.3)
Chloride: 102 mmol/L (ref 101–111)
Creatinine, Ser: 1.21 mg/dL — ABNORMAL HIGH (ref 0.44–1.00)
GFR calc Af Amer: 52 mL/min — ABNORMAL LOW (ref >60)
GFR calc non Af Amer: 45 mL/min — ABNORMAL LOW (ref >60)
Glucose, Bld: 109 mg/dL — ABNORMAL HIGH (ref 65–99)
Potassium: 4.2 mmol/L (ref 3.5–5.1)
Sodium: 136 mmol/L (ref 135–145)
Total Bilirubin: 1.1 mg/dL (ref 0.3–1.2)
Total Protein: 6.4 g/dL — ABNORMAL LOW (ref 6.5–8.1)

## 2016-10-08 MED ORDER — OXYCODONE HCL 5 MG PO TABS: 5.0000 mg | ORAL_TABLET | Freq: Four times a day (QID) | ORAL | 0 refills | Status: DC | PRN

## 2016-10-08 NOTE — Progress Notes (Signed)
Diff day for pt. Lost husband one year ago. Stated feels hopeless on and off but not today-does not want to talk to anyone.weepy this am

## 2016-10-08 NOTE — Progress Notes (Signed)
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 [name] grade 1 endometrial cancer when she presented with postmenopausal bleeding in February 2018. She underwent robotic hysterectomy and bilateral salpingo-oophorectomy with washings and sentinel lymph node injection and mapping and biopsy on 07/29/2016. She was found to have extensive pelvic adhesive disease and underwent elevated pelvic adhesiolysis including enteric and ovariolysis. The cul de sac was obliterated and adherent to posterior cervix.-was negative.  2. Pathology showed: Pathology DIAGNOSIS:  A. SENTINEL LYMPH NODE, RIGHT MID OBTURATOR; EXCISION:  - NO TUMOR SEEN IN ONE LYMPH NODE (0/1).   B. SENTINEL LYMPH NODE, RIGHT EXTERNAL ILIAC; EXCISION:  - NO TUMOR SEEN IN ONE LYMPH NODE (0/1).   C. SENTINEL LYMPH NODE, LEFT EXTERNAL ILIAC; EXCISION:  - NO TUMOR SEEN IN ONE LYMPH NODE (0/1).   D. UTERUS WITH CERVIX, BILATERAL FALLOPIAN TUBES AND OVARIES;  HYSTERECTOMY AND BILATERAL SALPINGO-OOPHORECTOMY:  - SEROUS CARCINOMA, 4.5 CM(SEE NOTE).  - LYMPHOVASCULAR INVASION PRESENT.  - NO TUMOR SEEN IN BILATERAL OVARIES AND FALLOPIAN TUBES.    ENDOMETRIUM:  Procedure: Hysterectomy with bilateral salpingo-oophorectomy  Histologic Type: Serous carcinoma  Myometrial Invasion: Present  Depth of invasion (millimeters): 13 mm  Myometrial thickness (millimeters): 14 mm    Percentage of myometrial invasion: 93%  Uterine Serosa Involvement: Not identified  Cervical Stromal Involvement: Not identified   BIOMARKER REPORTING TEMPLATE:  p53 Expression: Abnormal strong diffuse overexpression (>90%)  ER/PR negative. Her2 pending MS stable  3. She presented with fever on 08/05/2016 and CT scan showed 1.8 x 3.8 cm complex fluid collection containing pockets of air within somewhat organized walls within the pelvis and the hysterectomy bed most consistent with a developing abscess. There is a superior extension of the loculated fluid along the left lateral pelvic wall anterior to the left psoas muscle measuring 2 x 4 cm and another fluid collection along the anterior surface of the left psoas muscle measuring 1.8 x 3.6 cm. Blood cultures were positive for bacteria growing his fragilis. Urine cultures were negative. Patient was treated with IV antibiotics and discharged on Augmentin.  4. 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 [name] from urology on 08/14/2016. No leak was seen on CT urogram.  5. On 08/17/2016 patient underwentExam under anesthesia, vaginoscopy, cystoscopy, cystogram with interpretation of fluoroscopy less than 30 minutes, bilateral retrograde pyelogram, and right ureteral stent placement. Intraoperative findings: No obvious ureterovaginal vesicovaginal fistula appreciated. Mild right hydroureteronephrosis down to level of the distal ureter with slight medial deviation without filling defects. Slight mucopurulent drainage from left apical vaginal cuff, specimen cultured. Culture revealed bateroides fragilis and she was started on a 2 week course of Flagyl iniated on 08/24/2016 which she has now completed  6. Repeat CT abdomen on 09/10/2016 IMPRESSION: 1. Interval resolution of abscess involving the vaginal  cuff. 2. The proximal end of the right ureteral stent is  coiled in the proximal right ureter. No hydronephrosis. 3. No evidence of metastatic disease or other significant changes. 4. Aortic atherosclerosis   7. Patient has been recovering slowly following her complicated postoperative course. Currently she reports no fevers or vaginal leakage. Does report some fatigue but denies other complaints. She lives alone and is independent of her ADLs and IADLs. She has a prior history of Hodgkin's lymphoma in 2011 treated with anthracycline-based chemotherapy  8. There was concern about port infection and was looked at by vascular surgery. They did not think it was infected and recommend conservative management. I also spoke to Dr. [name] and plan is to proceed with 3 cycles of chemotherapy followed by WPRT   Interval history- Patient is feeling fairly well today.  She reports intense bone pain for 4 days following cycle 1 of Carboplatin, Paclitaxel, and Neulasta despite taking Claritin and Tylenol.  She denies any pain today.  She reports intermittent fatigue, denies weakness.  She has chronic intermittent numbness and tingling in her toes, present since previous chemotherapy.  She reports hot flashes and night sweats.  She has a runny nose she attributes to lack of hair in her nose.  She reports a left ear ache.  She has difficulty sleeping.  Her appetite is reduced but she is continuing to eat and her weight is stable.  She has occasional constipation and diarrhea.  She denies nausea, vomiting, hematuria, hematochezia, and melena.  She denies chest pain, shortness of breath, or cough.  ECOG PS- 1 Pain scale- 0 Opioid associated constipation- No  Review of systems- Review of Systems  Constitutional: Positive for malaise/fatigue. Negative for chills, fever and weight loss.  HENT: Positive for ear pain.   Respiratory: Negative.   Cardiovascular: Positive for leg swelling. Negative for chest pain  and palpitations.  Gastrointestinal: Positive for constipation and diarrhea. Negative for abdominal pain, blood in stool, melena, nausea and vomiting.  Genitourinary: Negative.   Musculoskeletal: Positive for myalgias. Negative for back pain and joint pain.       Bone pain  Skin: Negative.   Neurological: Positive for tingling, sensory change, weakness and headaches.       Occasional migraines  Psychiatric/Behavioral: Positive for depression. Negative for suicidal ideas. The patient is nervous/anxious and has insomnia.        1 yr anniversary of husband's death today     Allergies  Allergen Reactions  . Adhesive [Tape] Other (See Comments)    Burns the skin  . Z-Pak [Azithromycin] Itching  . Antifungal [Miconazole Nitrate] Rash  . Sulfa Antibiotics Nausea And Vomiting and Rash    N/T     Past Medical History:  Diagnosis Date  . Anxiety   . Cervicalgia   . Coronary artery disease    a. 02/2012 Stress echo: severe anterior wall ischemia;  b. 02/2012 Cath/PCI: LAD 95p (3.0 x 15 Xience EX DES), D1 90ost (PTCA - bifurcational dzs), EF 45% with anterior HK;  b. 02/2013 Ex MV: fixed anterior defect w/ minor reversibility, nl EF-->Med Rx.  . Endometrial cancer ([place])    a. 07/2016 s/p robotic hysterectomy, BSO w/ washings, sentinel node inj, mapping, bx, adhesiolysis.  . Essential hypertension, benign   . Fibrocystic breast disease   . GERD (gastroesophageal reflux disease)   . Gestational hypertension   . Heart murmur   . History of anemia   . History of blood transfusion   . Hodgkin's lymphoma ([place]) 2011   a. s/p radiation and chemo therapy  .  Osteoarthritis   . Polycystic ovarian disease      Past Surgical History:  Procedure Laterality Date  . ABDOMINAL HYSTERECTOMY    . bladder sling    . CARDIAC CATHETERIZATION  02/2012   [facility] 1 stent place  . CERVICAL POLYPECTOMY    . CHOLECYSTECTOMY  1982  . COLONOSCOPY WITH PROPOFOL N/A 02/05/2015   Procedure: COLONOSCOPY WITH  PROPOFOL;  Surgeon: Lucilla Lame, MD;  Location: [facility] ENDOSCOPY;  Service: Endoscopy;  Laterality: N/A;  . CORONARY ANGIOPLASTY  02/2012   left/right s/p balloon  . CYSTOGRAM N/A 08/17/2016   Procedure: CYSTOGRAM;  Surgeon: Hollice Espy, MD;  Location: [facility] ORS;  Service: Urology;  Laterality: N/A;  . CYSTOSCOPY N/A 08/17/2016   Procedure: CYSTOSCOPY EXAM UNDER ANESTHESIA;  Surgeon: Hollice Espy, MD;  Location: [facility] ORS;  Service: Urology;  Laterality: N/A;  . CYSTOSCOPY W/ RETROGRADES Bilateral 08/17/2016   Procedure: CYSTOSCOPY WITH RETROGRADE PYELOGRAM;  Surgeon: Hollice Espy, MD;  Location: [facility] ORS;  Service: Urology;  Laterality: Bilateral;  . CYSTOSCOPY WITH STENT PLACEMENT Right 08/17/2016   Procedure: CYSTOSCOPY WITH STENT PLACEMENT;  Surgeon: Hollice Espy, MD;  Location: [facility] ORS;  Service: Urology;  Laterality: Right;  . heart stent'  2013  . kidney stent Right 2018  . LYMPH NODE BIOPSY  2011   diagnosis of hodgkins lymphoma  . PELVIC LYMPH NODE DISSECTION N/A 07/29/2016   Procedure: PELVIC/AORTIC LYMPH NODE SAMPLING;  Surgeon: [name], MD;  Location: [facility] ORS;  Service: Gynecology;  Laterality: N/A;  . PORTA CATH INSERTION N/A 09/22/2016   Procedure: Glori Luis Cath Insertion;  Surgeon: [name], MD;  Location: [place] [facility];  Service: Cardiovascular;  Laterality: N/A;  . ROBOTIC ASSISTED TOTAL HYSTERECTOMY WITH BILATERAL SALPINGO OOPHERECTOMY N/A 07/29/2016   Procedure: ROBOTIC ASSISTED TOTAL HYSTERECTOMY WITH BILATERAL SALPINGO OOPHORECTOMY;  Surgeon: [name], MD;  Location: [facility] ORS;  Service: Gynecology;  Laterality: N/A;  . SENTINEL NODE BIOPSY N/A 07/29/2016   Procedure: SENTINEL NODE BIOPSY;  Surgeon: [name], MD;  Location: [facility] ORS;  Service: Gynecology;  Laterality: N/A;  . transobturator sling N/A 2009   [place] History   Social History  . Marital status: Widowed    Spouse name: N/A  . Number of  children: N/A  . Years of education: N/A   Occupational History  . Not on file.   Social History Main Topics  . Smoking status: Former Smoker    Packs/day: 1.00    Years: 30.00    Types: Cigarettes    Quit date: 07/24/2002  . Smokeless tobacco: Never Used     Comment: quit smoking in 2000  . Alcohol use Yes     Comment: occasional, 1-2 drinks per month  . Drug use: No  . Sexual activity: No   Other Topics Concern  . Not on file   Social History Narrative  . No narrative on file    Family History  Problem Relation Age of Onset  . ALS Father   . Polymyositis Father   . Diabetes Brother   . Cancer Maternal Aunt     breast  . Breast cancer Maternal Aunt     30's  . Stroke Maternal Grandmother   . Cancer Maternal Grandfather     prostate  . Stroke Maternal Grandfather      Current Outpatient Prescriptions:  .  acetaminophen (TYLENOL) 500 MG tablet, Take 1,000 mg by mouth every 6 (six) hours as needed., Disp: ,  Rfl:  .  aspirin EC 81 MG tablet, Take 81 mg by mouth at bedtime., Disp: , Rfl:  .  atorvastatin (LIPITOR) 10 MG tablet, TAKE ONE TABLET BY MOUTH ONCE DAILY (Patient taking differently: Take 10 mg by mouth at bedtime. ), Disp: 90 tablet, Rfl: 3 .  BIOTIN 5000 PO, Take 1 Dose by mouth daily., Disp: , Rfl:  .  Cholecalciferol (VITAMIN D3) 2000 units TABS, Take 2,000 Units by mouth 3 (three) times a week., Disp: , Rfl:  .  dexamethasone (DECADRON) 4 MG tablet, Take 2 tablets (8 mg total) by mouth daily. Start the day after chemotherapy for 2 days., Disp: 30 tablet, Rfl: 1 .  diazepam (VALIUM) 5 MG tablet, Take 1 tablet (5 mg total) by mouth every 12 (twelve) hours as needed for anxiety., Disp: 30 tablet, Rfl: 1 .  esomeprazole (NEXIUM) 20 MG capsule, Take 20 mg by mouth daily before breakfast. , Disp: , Rfl:  .  ibuprofen (ADVIL,MOTRIN) 200 MG tablet, Take 200 mg by mouth every 6 (six) hours as needed., Disp: , Rfl:  .  lidocaine-prilocaine (EMLA) cream, Apply to  affected area once, Disp: 30 g, Rfl: 3 .  LORazepam (ATIVAN) 0.5 MG tablet, Take 1 tablet (0.5 mg total) by mouth every 6 (six) hours as needed (Nausea or vomiting)., Disp: 30 tablet, Rfl: 0 .  metoprolol tartrate (LOPRESSOR) 25 MG tablet, TAKE ONE TABLET BY MOUTH TWICE DAILY, Disp: 180 tablet, Rfl: 2 .  metroNIDAZOLE (FLAGYL) 500 MG tablet, , Disp: , Rfl:  .  naproxen sodium (ANAPROX) 220 MG tablet, Take 440 mg by mouth 2 (two) times daily as needed (for pain/headache.)., Disp: , Rfl:  .  nitroGLYCERIN (NITROSTAT) 0.4 MG SL tablet, Place 1 tablet (0.4 mg total) under the tongue every 5 (five) minutes as needed for chest pain., Disp: 25 tablet, Rfl: 3 .  ondansetron (ZOFRAN) 8 MG tablet, Take 1 tablet (8 mg total) by mouth 2 (two) times daily as needed for refractory nausea / vomiting. Start on day 3 after chemo., Disp: 30 tablet, Rfl: 1 .  oxyCODONE (OXY IR/ROXICODONE) 5 MG immediate release tablet, Take 1 tablet (5 mg total) by mouth every 6 (six) hours as needed for severe pain., Disp: 30 tablet, Rfl: 0 .  prochlorperazine (COMPAZINE) 10 MG tablet, Take 1 tablet (10 mg total) by mouth every 6 (six) hours as needed (Nausea or vomiting)., Disp: 30 tablet, Rfl: 1 .  Turmeric 500 MG CAPS, Take 500 mg by mouth 2 (two) times daily., Disp: , Rfl:  .  vitamin B-12 (CYANOCOBALAMIN) 1000 MCG tablet, Take 1,000 mcg by mouth daily., Disp: , Rfl:   Physical exam:  Vitals:   10/08/16 0847  BP: 108/72  Pulse: 93  Resp: 18  Temp: 97.3 F (36.3 C)  TempSrc: Tympanic  Weight: 175 lb (79.4 kg)   Physical Exam  Constitutional: She is oriented to person, place, and time and well-developed, well-nourished, and in no distress.  HENT:  Head: Normocephalic and atraumatic.  Eyes: Pupils are equal, round, and reactive to light.  Neck: Normal range of motion. Neck supple.  Cardiovascular: Normal rate, regular rhythm and normal heart sounds.   Pulmonary/Chest: Effort normal and breath sounds normal.  Abdominal:  Soft. Bowel sounds are normal.  Musculoskeletal: Normal range of motion. She exhibits edema.  BLE non-pitting edema  Neurological: She is alert and oriented to person, place, and time.  Skin: Skin is warm and dry.  Psychiatric:  Sad, tearful  CMP Latest Ref Rng & Units 10/08/2016  Glucose 65 - 99 mg/dL 109(H)  BUN 6 - 20 mg/dL 26(H)  Creatinine 0.44 - 1.00 mg/dL 1.21(H)  Sodium 135 - 145 mmol/L 136  Potassium 3.5 - 5.1 mmol/L 4.2  Chloride 101 - 111 mmol/L 102  CO2 22 - 32 mmol/L 29  Calcium 8.9 - 10.3 mg/dL 9.1  Total Protein 6.5 - 8.1 g/dL 6.4(L)  Total Bilirubin 0.3 - 1.2 mg/dL 1.1  Alkaline Phos 38 - 126 U/L 76  AST 15 - 41 U/L 19  ALT 14 - 54 U/L 30   CBC Latest Ref Rng & Units 10/08/2016  WBC 3.6 - 11.0 K/uL 2.1(L)  Hemoglobin 12.0 - 16.0 g/dL 13.8  Hematocrit 35.0 - 47.0 % 40.7  Platelets 150 - 440 K/uL 95(L)    No images are attached to the encounter.  Ct Abdomen Pelvis W Contrast  Result Date: 09/11/2016 CLINICAL DATA:  Endometrial cancer status post total hysterectomy. Hodgkin's lymphoma in 2011. No current complaints. EXAM: CT ABDOMEN AND PELVIS WITH CONTRAST TECHNIQUE: Multidetector CT imaging of the abdomen and pelvis was performed using the standard protocol following bolus administration of intravenous contrast. CONTRAST:  177m ISOVUE-300 IOPAMIDOL (ISOVUE-300) INJECTION 61% COMPARISON:  CT 08/13/2016 and 08/05/2016. FINDINGS: Lower chest: Clear lung bases. No significant pleural or pericardial effusion. Hepatobiliary: No focal hepatic abnormalities are demonstrated. There is stable mild biliary dilatation status post cholecystectomy, within physiologic limits. Pancreas: Unremarkable. No pancreatic ductal dilatation or surrounding inflammatory changes. Spleen: Normal in size without focal abnormality. Adrenals/Urinary Tract: Both adrenal glands appear normal. There are stable low-density renal lesions bilaterally, largest in the lower pole of the right kidney  consistent with a simple cyst. There has been interval placement of a right ureteral stent. Superiorly, this stent is coiled in the proximal right ureter. The distal end of the stent is within the bladder. There is no evidence of hydronephrosis or delay in contrast excretion. No bladder lesions are evident. There is no air within the urinary bladder lumen in this patient with a history of repaired vaginal fistula. Stomach/Bowel: No evidence of bowel wall thickening, distention or surrounding inflammatory change. The appendix appears normal. There is moderate stool throughout the colon. Vascular/Lymphatic: There are no enlarged abdominal or pelvic lymph nodes. Stable aortic and branch vessel atherosclerosis. Reproductive: Hysterectomy. No evidence of adnexal mass. There is no evidence of residual fluid collection, inflammation or mass in the region of the vaginal cuff. Other: No ascites or peritoneal nodularity. Stable postsurgical changes in the anterior abdominal wall. Musculoskeletal: No acute or significant osseous findings. Grade 1 degenerative anterolisthesis at L4-5. IMPRESSION: 1. Interval resolution of abscess involving the vaginal cuff. 2. The proximal end of the right ureteral stent is coiled in the proximal right ureter. No hydronephrosis. 3. No evidence of metastatic disease or other significant changes. 4. Aortic atherosclerosis. Electronically Signed   By: WRichardean SaleM.D.   On: 09/11/2016 08:16     Assessment and plan- Patient is a 66y.o. 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  She is tolerating treatment relatively well, with reported bone pain lasting 4 days after treatment.  Patient to take Claritin and Tylenol for bone pain, then occasional Aleve if pain persists.    Rx: Oxycodone [id number] prn for severe pain.  Insomnia/Anxiety: Discussed 4-7-8 breathing technique at bedtime: breathe in to count of 4, hold breath for count of 7, exhale for count  of 8; do 3-5  times for letting go of overactive thoughts.  Patient demonstrated understanding.  Provided breath ratio chart to patient for relaxing, balancing, and energizing breathing techniques.  Provide employer letter for patient for 10 days off after each chemotherapy treatment.  RTC in 2 weeks for labs (CBC, CMP), MD assessment, and Cycle #2 of 4 of carbo/taxol + Neulasta     Visit Diagnosis 1. Endometrial cancer ([place])      Lucendia Herrlich, NP  10/08/16 10:07 AM

## 2016-10-22 ENCOUNTER — Encounter: Payer: Self-pay | Admitting: Oncology

## 2016-10-22 ENCOUNTER — Other Ambulatory Visit: Payer: Self-pay | Admitting: *Deleted

## 2016-10-22 ENCOUNTER — Inpatient Hospital Stay: Payer: PPO

## 2016-10-22 ENCOUNTER — Inpatient Hospital Stay: Payer: PPO | Attending: Oncology | Admitting: Oncology

## 2016-10-22 VITALS — BP 106/63 | HR 74 | Temp 98.0°F | Ht 61.75 in | Wt 178.4 lb

## 2016-10-22 DIAGNOSIS — K219 Gastro-esophageal reflux disease without esophagitis: Secondary | ICD-10-CM | POA: Insufficient documentation

## 2016-10-22 DIAGNOSIS — Z79899 Other long term (current) drug therapy: Secondary | ICD-10-CM

## 2016-10-22 DIAGNOSIS — C541 Malignant neoplasm of endometrium: Secondary | ICD-10-CM | POA: Diagnosis not present

## 2016-10-22 DIAGNOSIS — I2510 Atherosclerotic heart disease of native coronary artery without angina pectoris: Secondary | ICD-10-CM | POA: Diagnosis not present

## 2016-10-22 DIAGNOSIS — F419 Anxiety disorder, unspecified: Secondary | ICD-10-CM

## 2016-10-22 DIAGNOSIS — M1990 Unspecified osteoarthritis, unspecified site: Secondary | ICD-10-CM | POA: Diagnosis not present

## 2016-10-22 DIAGNOSIS — Z90722 Acquired absence of ovaries, bilateral: Secondary | ICD-10-CM | POA: Insufficient documentation

## 2016-10-22 DIAGNOSIS — E282 Polycystic ovarian syndrome: Secondary | ICD-10-CM | POA: Diagnosis not present

## 2016-10-22 DIAGNOSIS — Z90710 Acquired absence of both cervix and uterus: Secondary | ICD-10-CM | POA: Insufficient documentation

## 2016-10-22 DIAGNOSIS — Z5111 Encounter for antineoplastic chemotherapy: Secondary | ICD-10-CM | POA: Insufficient documentation

## 2016-10-22 DIAGNOSIS — Z87891 Personal history of nicotine dependence: Secondary | ICD-10-CM | POA: Diagnosis not present

## 2016-10-22 DIAGNOSIS — Z8571 Personal history of Hodgkin lymphoma: Secondary | ICD-10-CM | POA: Diagnosis not present

## 2016-10-22 DIAGNOSIS — G629 Polyneuropathy, unspecified: Secondary | ICD-10-CM | POA: Insufficient documentation

## 2016-10-22 DIAGNOSIS — Z7982 Long term (current) use of aspirin: Secondary | ICD-10-CM | POA: Insufficient documentation

## 2016-10-22 DIAGNOSIS — N736 Female pelvic peritoneal adhesions (postinfective): Secondary | ICD-10-CM | POA: Insufficient documentation

## 2016-10-22 DIAGNOSIS — I10 Essential (primary) hypertension: Secondary | ICD-10-CM

## 2016-10-22 DIAGNOSIS — I700 Atherosclerosis of aorta: Secondary | ICD-10-CM | POA: Insufficient documentation

## 2016-10-22 LAB — CBC WITH DIFFERENTIAL/PLATELET
Basophils Absolute: 0.1 10*3/uL (ref 0–0.1)
Basophils Relative: 1 %
Eosinophils Absolute: 0.2 10*3/uL (ref 0–0.7)
Eosinophils Relative: 4 %
HCT: 34.2 % — ABNORMAL LOW (ref 35.0–47.0)
Hemoglobin: 11.9 g/dL — ABNORMAL LOW (ref 12.0–16.0)
Lymphocytes Relative: 20 %
Lymphs Abs: 1.3 10*3/uL (ref 1.0–3.6)
MCH: 30.9 pg (ref 26.0–34.0)
MCHC: 34.8 g/dL (ref 32.0–36.0)
MCV: 88.9 fL (ref 80.0–100.0)
Monocytes Absolute: 0.5 10*3/uL (ref 0.2–0.9)
Monocytes Relative: 9 %
Neutro Abs: 4.2 10*3/uL (ref 1.4–6.5)
Neutrophils Relative %: 66 %
Platelets: 293 10*3/uL (ref 150–440)
RBC: 3.84 MIL/uL (ref 3.80–5.20)
RDW: 14.8 % — ABNORMAL HIGH (ref 11.5–14.5)
WBC: 6.3 10*3/uL (ref 3.6–11.0)

## 2016-10-22 LAB — COMPREHENSIVE METABOLIC PANEL
ALT: 32 U/L (ref 14–54)
AST: 27 U/L (ref 15–41)
Albumin: 3.5 g/dL (ref 3.5–5.0)
Alkaline Phosphatase: 79 U/L (ref 38–126)
Anion gap: 3 — ABNORMAL LOW (ref 5–15)
BUN: 15 mg/dL (ref 6–20)
CO2: 27 mmol/L (ref 22–32)
Calcium: 8.7 mg/dL — ABNORMAL LOW (ref 8.9–10.3)
Chloride: 108 mmol/L (ref 101–111)
Creatinine, Ser: 0.94 mg/dL (ref 0.44–1.00)
GFR calc Af Amer: >60 mL/min (ref >60)
GFR calc non Af Amer: >60 mL/min (ref >60)
Glucose, Bld: 117 mg/dL — ABNORMAL HIGH (ref 65–99)
Potassium: 4.5 mmol/L (ref 3.5–5.1)
Sodium: 138 mmol/L (ref 135–145)
Total Bilirubin: 0.7 mg/dL (ref 0.3–1.2)
Total Protein: 6.4 g/dL — ABNORMAL LOW (ref 6.5–8.1)

## 2016-10-22 MED ORDER — HEPARIN SOD (PORK) LOCK FLUSH 100 UNIT/ML IV SOLN
500.0000 [IU] | Freq: Once | INTRAVENOUS | Status: AC
  Administered 2016-10-22: 500 [IU] via INTRAVENOUS
  Filled 2016-10-22: qty 5

## 2016-10-22 MED ORDER — SODIUM CHLORIDE 0.9% FLUSH
10.0000 mL | Freq: Once | INTRAVENOUS | Status: AC
  Administered 2016-10-22: 10 mL via INTRAVENOUS
  Filled 2016-10-22: qty 10

## 2016-10-22 MED ORDER — DIPHENHYDRAMINE HCL 50 MG/ML IJ SOLN
25.0000 mg | Freq: Once | INTRAMUSCULAR | Status: AC
  Administered 2016-10-22: 25 mg via INTRAVENOUS
  Filled 2016-10-22: qty 1

## 2016-10-22 MED ORDER — LORAZEPAM 2 MG/ML IJ SOLN
0.5000 mg | Freq: Once | INTRAMUSCULAR | Status: AC
  Administered 2016-10-22: 0.5 mg via INTRAVENOUS
  Filled 2016-10-22: qty 1

## 2016-10-22 MED ORDER — SODIUM CHLORIDE 0.9 % IV SOLN
Freq: Once | INTRAVENOUS | Status: AC
  Administered 2016-10-22: 10:00:00 via INTRAVENOUS
  Filled 2016-10-22: qty 1000

## 2016-10-22 MED ORDER — SODIUM CHLORIDE 0.9 % IV SOLN
175.0000 mg/m2 | Freq: Once | INTRAVENOUS | Status: AC
  Administered 2016-10-22: 330 mg via INTRAVENOUS
  Filled 2016-10-22: qty 55

## 2016-10-22 MED ORDER — FAMOTIDINE IN NACL 20-0.9 MG/50ML-% IV SOLN
20.0000 mg | Freq: Once | INTRAVENOUS | Status: AC
  Administered 2016-10-22: 20 mg via INTRAVENOUS
  Filled 2016-10-22: qty 50

## 2016-10-22 MED ORDER — SODIUM CHLORIDE 0.9 % IV SOLN
470.0000 mg | Freq: Once | INTRAVENOUS | Status: AC
  Administered 2016-10-22: 470 mg via INTRAVENOUS
  Filled 2016-10-22: qty 47

## 2016-10-22 MED ORDER — DIAZEPAM 5 MG PO TABS: 5.0000 mg | ORAL_TABLET | Freq: Two times a day (BID) | ORAL | 0 refills | Status: DC | PRN

## 2016-10-22 MED ORDER — SODIUM CHLORIDE 0.9 % IV SOLN
20.0000 mg | Freq: Once | INTRAVENOUS | Status: AC
  Administered 2016-10-22: 20 mg via INTRAVENOUS
  Filled 2016-10-22: qty 2

## 2016-10-22 MED ORDER — PALONOSETRON HCL INJECTION 0.25 MG/5ML
0.2500 mg | Freq: Once | INTRAVENOUS | Status: AC
  Administered 2016-10-22: 0.25 mg via INTRAVENOUS
  Filled 2016-10-22: qty 5

## 2016-10-22 MED ORDER — HEPARIN SOD (PORK) LOCK FLUSH 100 UNIT/ML IV SOLN: 500.0000 [IU] | Freq: Once | INTRAVENOUS | Status: DC | PRN

## 2016-10-22 MED ORDER — PEGFILGRASTIM 6 MG/0.6ML ~~LOC~~ PSKT
6.0000 mg | PREFILLED_SYRINGE | Freq: Once | SUBCUTANEOUS | Status: AC
  Administered 2016-10-22: 6 mg via SUBCUTANEOUS
  Filled 2016-10-22: qty 0.6

## 2016-10-22 NOTE — Progress Notes (Signed)
Patient here for follow up. No changes since last appointment.  

## 2016-10-22 NOTE — Progress Notes (Signed)
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 [name] grade 1 endometrial cancer when she presented with postmenopausal bleeding in February 2018. She underwent robotic hysterectomy and bilateral salpingo-oophorectomy with washings and sentinel lymph node injection and mapping and biopsy on 07/29/2016. She was found to have extensive pelvic adhesive disease and underwent elevated pelvic adhesiolysis including enteric and ovariolysis. The cul de sac was obliterated and adherent to posterior cervix.-was negative.  2. Pathology showed: Pathology DIAGNOSIS:  A. SENTINEL LYMPH NODE, RIGHT MID OBTURATOR; EXCISION:  - NO TUMOR SEEN IN ONE LYMPH NODE (0/1).   B. SENTINEL LYMPH NODE, RIGHT EXTERNAL ILIAC; EXCISION:  - NO TUMOR SEEN IN ONE LYMPH NODE (0/1).   C. SENTINEL LYMPH NODE, LEFT EXTERNAL ILIAC; EXCISION:  - NO TUMOR SEEN IN ONE LYMPH NODE (0/1).   D. UTERUS WITH CERVIX, BILATERAL FALLOPIAN TUBES AND OVARIES;  HYSTERECTOMY AND BILATERAL SALPINGO-OOPHORECTOMY:  - SEROUS CARCINOMA, 4.5 CM(SEE NOTE).  - LYMPHOVASCULAR INVASION PRESENT.  - NO TUMOR SEEN IN BILATERAL OVARIES AND FALLOPIAN TUBES.    ENDOMETRIUM:  Procedure: Hysterectomy with bilateral salpingo-oophorectomy  Histologic Type: Serous carcinoma  Myometrial Invasion: Present  Depth of invasion (millimeters): 13 mm  Myometrial thickness  (millimeters): 14 mm  Percentage of myometrial invasion: 93%  Uterine Serosa Involvement: Not identified  Cervical Stromal Involvement: Not identified   BIOMARKER REPORTING TEMPLATE:  p53 Expression: Abnormal strong diffuse overexpression (>90%)  ER/PR negative. Her2 pending MS stable  3. She presented with fever on 08/05/2016 and CT scan showed 1.8 x 3.8 cm complex fluid collection containing pockets of air within somewhat organized walls within the pelvis and the hysterectomy bed most consistent with a developing abscess. There is a superior extension of the loculated fluid along the left lateral pelvic wall anterior to the left psoas muscle measuring 2 x 4 cm and another fluid collection along the anterior surface of the left psoas muscle measuring 1.8 x 3.6 cm. Blood cultures were positive for bacteria growing his fragilis. Urine cultures were negative. Patient was treated with IV antibiotics and discharged on Augmentin.  4. 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 [name] from urology on 08/14/2016. No leak was seen on CT urogram.  5. On 08/17/2016 patient underwentExam under anesthesia, vaginoscopy, cystoscopy, cystogram with interpretation of fluoroscopy less than 30 minutes, bilateral retrograde pyelogram, and right ureteral stent placement. Intraoperative findings: No obvious ureterovaginal vesicovaginal fistula appreciated. Mild right hydroureteronephrosis down to level of the distal ureter with slight medial deviation without filling defects. Slight mucopurulent drainage from left apical vaginal cuff, specimen cultured. Culture revealed bateroides fragilis and she was started on a 2 week course of Flagyl iniated on 08/24/2016 which she has now completed  6. Repeat CT abdomen on 09/10/2016 IMPRESSION: 1. Interval resolution of abscess  involving the vaginal cuff. 2. The proximal end of the right ureteral  stent is coiled in the proximal right ureter. No hydronephrosis. 3. No evidence of metastatic disease or other significant changes. 4. Aortic atherosclerosis   7. Patient has been recovering slowly following her complicated postoperative course. Currently she reports no fevers or vaginal leakage. Does report some fatigue but denies other complaints. She lives alone and is independent of her ADLs and IADLs. She has a prior history of Hodgkin's lymphoma in 2011 treated with anthracycline-based chemotherapy  8. There was concern about port infection and was looked at by vascular surgery. They did not think it was infected and recommend conservative management. I also spoke to Dr. [name] and plan is to proceed with 3 cycles of chemotherapy followed by WPRT    Interval history- reports port site and enck area sore especially when she moves her neck. No efevr  ECOG PS- 0 Pain scale- 0 Opioid associated constipation- no  Review of systems- Review of Systems  Constitutional: Negative for chills, fever, malaise/fatigue and weight loss.  HENT: Negative for congestion, ear discharge and nosebleeds.   Eyes: Negative for blurred vision.  Respiratory: Negative for cough, hemoptysis, sputum production, shortness of breath and wheezing.   Cardiovascular: Negative for chest pain, palpitations, orthopnea and claudication.  Gastrointestinal: Negative for abdominal pain, blood in stool, constipation, diarrhea, heartburn, melena, nausea and vomiting.  Genitourinary: Negative for dysuria, flank pain, frequency, hematuria and urgency.  Musculoskeletal: Negative for back pain, joint pain and myalgias.  Skin: Negative for rash.  Neurological: Negative for dizziness, tingling, focal weakness, seizures, weakness and headaches.  Endo/Heme/Allergies: Does not bruise/bleed easily.  Psychiatric/Behavioral: Negative for depression and suicidal  ideas. The patient does not have insomnia.       Allergies  Allergen Reactions  . Adhesive [Tape] Other (See Comments)    Burns the skin  . Z-Pak [Azithromycin] Itching  . Antifungal [Miconazole Nitrate] Rash  . Sulfa Antibiotics Nausea And Vomiting and Rash    N/T     Past Medical History:  Diagnosis Date  . Anxiety   . Cervicalgia   . Coronary artery disease    a. 02/2012 Stress echo: severe anterior wall ischemia;  b. 02/2012 Cath/PCI: LAD 95p (3.0 x 15 Xience EX DES), D1 90ost (PTCA - bifurcational dzs), EF 45% with anterior HK;  b. 02/2013 Ex MV: fixed anterior defect w/ minor reversibility, nl EF-->Med Rx.  . Endometrial cancer ([place])    a. 07/2016 s/p robotic hysterectomy, BSO w/ washings, sentinel node inj, mapping, bx, adhesiolysis.  . Essential hypertension, benign   . Fibrocystic breast disease   . GERD (gastroesophageal reflux disease)   . Gestational hypertension   . Heart murmur   . History of anemia   . History of blood transfusion   . Hodgkin's lymphoma ([place]) 2011   a. s/p radiation and chemo therapy  . Osteoarthritis   . Polycystic ovarian disease      Past Surgical History:  Procedure Laterality Date  . ABDOMINAL HYSTERECTOMY    . bladder sling    . CARDIAC CATHETERIZATION  02/2012   [facility] 1 stent place  . CERVICAL POLYPECTOMY    . CHOLECYSTECTOMY  1982  . COLONOSCOPY WITH PROPOFOL N/A 02/05/2015   Procedure: COLONOSCOPY WITH PROPOFOL;  Surgeon: Lucilla Lame, MD;  Location: [facility] ENDOSCOPY;  Service: Endoscopy;  Laterality: N/A;  . CORONARY ANGIOPLASTY  02/2012   left/right s/p balloon  . CYSTOGRAM N/A 08/17/2016   Procedure: CYSTOGRAM;  Surgeon: Hollice Espy, MD;  Location: [facility] ORS;  Service: Urology;  Laterality: N/A;  .  CYSTOSCOPY N/A 08/17/2016   Procedure: CYSTOSCOPY EXAM UNDER ANESTHESIA;  Surgeon: Hollice Espy, MD;  Location: [facility] ORS;  Service: Urology;  Laterality: N/A;  . CYSTOSCOPY W/ RETROGRADES Bilateral 08/17/2016   Procedure: CYSTOSCOPY WITH  RETROGRADE PYELOGRAM;  Surgeon: Hollice Espy, MD;  Location: [facility] ORS;  Service: Urology;  Laterality: Bilateral;  . CYSTOSCOPY WITH STENT PLACEMENT Right 08/17/2016   Procedure: CYSTOSCOPY WITH STENT PLACEMENT;  Surgeon: Hollice Espy, MD;  Location: [facility] ORS;  Service: Urology;  Laterality: Right;  . heart stent'  2013  . kidney stent Right 2018  . LYMPH NODE BIOPSY  2011   diagnosis of hodgkins lymphoma  . PELVIC LYMPH NODE DISSECTION N/A 07/29/2016   Procedure: PELVIC/AORTIC LYMPH NODE SAMPLING;  Surgeon: [name], MD;  Location: [facility] ORS;  Service: Gynecology;  Laterality: N/A;  . PORTA CATH INSERTION N/A 09/22/2016   Procedure: Glori Luis Cath Insertion;  Surgeon: [name], MD;  Location: [facility];  Service: Cardiovascular;  Laterality: N/A;  . ROBOTIC ASSISTED TOTAL HYSTERECTOMY WITH BILATERAL SALPINGO OOPHERECTOMY N/A 07/29/2016   Procedure: ROBOTIC ASSISTED TOTAL HYSTERECTOMY WITH BILATERAL SALPINGO OOPHORECTOMY;  Surgeon: [name], MD;  Location: [facility] ORS;  Service: Gynecology;  Laterality: N/A;  . SENTINEL NODE BIOPSY N/A 07/29/2016   Procedure: SENTINEL NODE BIOPSY;  Surgeon: [name], MD;  Location: [facility] ORS;  Service: Gynecology;  Laterality: N/A;  . transobturator sling N/A 2009   [place] History   Social History  . Marital status: Widowed    Spouse name: N/A  . Number of children: N/A  . Years of education: N/A   Occupational History  . Not on file.   Social History Main Topics  . Smoking status: Former Smoker    Packs/day: 1.00    Years: 30.00    Types: Cigarettes    Quit date: 07/24/2002  . Smokeless tobacco: Never Used     Comment: quit smoking in 2000  . Alcohol use Yes     Comment: occasional, 1-2 drinks per month  . Drug use: No  . Sexual activity: No   Other Topics Concern  . Not on file   Social History Narrative  . No narrative on file    Family History  Problem Relation Age  of Onset  . ALS Father   . Polymyositis Father   . Diabetes Brother   . Cancer Maternal Aunt        breast  . Breast cancer Maternal Aunt        30's  . Stroke Maternal Grandmother   . Cancer Maternal Grandfather        prostate  . Stroke Maternal Grandfather      Current Outpatient Prescriptions:  .  acetaminophen (TYLENOL) 500 MG tablet, Take 1,000 mg by mouth every 6 (six) hours as needed., Disp: , Rfl:  .  aspirin EC 81 MG tablet, Take 81 mg by mouth at bedtime., Disp: , Rfl:  .  atorvastatin (LIPITOR) 10 MG tablet, TAKE ONE TABLET BY MOUTH ONCE DAILY (Patient taking differently: Take 10 mg by mouth at bedtime. ), Disp: 90 tablet, Rfl: 3 .  BIOTIN 5000 PO, Take 1 Dose by mouth daily., Disp: , Rfl:  .  Cholecalciferol (VITAMIN D3) 2000 units TABS, Take 2,000 Units by mouth 3 (three) times a week., Disp: , Rfl:  .  dexamethasone (DECADRON) 4 MG tablet, Take 2 tablets (8 mg total) by mouth daily. Start the day after chemotherapy for  2 days., Disp: 30 tablet, Rfl: 1 .  diazepam (VALIUM) 5 MG tablet, Take 1 tablet (5 mg total) by mouth every 12 (twelve) hours as needed for anxiety., Disp: 30 tablet, Rfl: 1 .  esomeprazole (NEXIUM) 20 MG capsule, Take 20 mg by mouth daily before breakfast. , Disp: , Rfl:  .  ibuprofen (ADVIL,MOTRIN) 200 MG tablet, Take 200 mg by mouth every 6 (six) hours as needed., Disp: , Rfl:  .  lidocaine-prilocaine (EMLA) cream, Apply to affected area once, Disp: 30 g, Rfl: 3 .  LORazepam (ATIVAN) 0.5 MG tablet, Take 1 tablet (0.5 mg total) by mouth every 6 (six) hours as needed (Nausea or vomiting)., Disp: 30 tablet, Rfl: 0 .  metoprolol tartrate (LOPRESSOR) 25 MG tablet, TAKE ONE TABLET BY MOUTH TWICE DAILY, Disp: 180 tablet, Rfl: 2 .  metroNIDAZOLE (FLAGYL) 500 MG tablet, , Disp: , Rfl:  .  naproxen sodium (ANAPROX) 220 MG tablet, Take 440 mg by mouth 2 (two) times daily as needed (for pain/headache.)., Disp: , Rfl:  .  nitroGLYCERIN (NITROSTAT) 0.4 MG SL tablet,  Place 1 tablet (0.4 mg total) under the tongue every 5 (five) minutes as needed for chest pain., Disp: 25 tablet, Rfl: 3 .  ondansetron (ZOFRAN) 8 MG tablet, Take 1 tablet (8 mg total) by mouth 2 (two) times daily as needed for refractory nausea / vomiting. Start on day 3 after chemo., Disp: 30 tablet, Rfl: 1 .  oxyCODONE (OXY IR/ROXICODONE) 5 MG immediate release tablet, Take 1 tablet (5 mg total) by mouth every 6 (six) hours as needed for severe pain., Disp: 30 tablet, Rfl: 0 .  prochlorperazine (COMPAZINE) 10 MG tablet, Take 1 tablet (10 mg total) by mouth every 6 (six) hours as needed (Nausea or vomiting)., Disp: 30 tablet, Rfl: 1 .  Turmeric 500 MG CAPS, Take 500 mg by mouth 2 (two) times daily., Disp: , Rfl:  .  vitamin B-12 (CYANOCOBALAMIN) 1000 MCG tablet, Take 1,000 mcg by mouth daily., Disp: , Rfl:   Physical exam:  Vitals:   10/22/16 0910  BP: 106/63  Pulse: 74  Temp: 98 F (36.7 C)  TempSrc: Tympanic  Weight: 178 lb 6.4 oz (80.9 kg)  Height: 5' 1.75" (1.568 m)   Physical Exam  Constitutional: She is oriented to person, place, and time and well-developed, well-nourished, and in no distress.  HENT:  Head: Normocephalic and atraumatic.  Eyes: EOM are normal. Pupils are equal, round, and reactive to light.  Neck: Normal range of motion.  Cardiovascular: Normal rate, regular rhythm and normal heart sounds.   Pulmonary/Chest: Effort normal and breath sounds normal.  Port site looks good. No signs of infection or inflammation  Abdominal: Soft. Bowel sounds are normal.  Musculoskeletal: She exhibits edema (trace LLE edema).  Neurological: She is alert and oriented to person, place, and time.  Skin: Skin is warm and dry.     CMP Latest Ref Rng & Units 10/08/2016  Glucose 65 - 99 mg/dL 109(H)  BUN 6 - 20 mg/dL 26(H)  Creatinine 0.44 - 1.00 mg/dL 1.21(H)  Sodium 135 - 145 mmol/L 136  Potassium 3.5 - 5.1 mmol/L 4.2  Chloride 101 - 111 mmol/L 102  CO2 22 - 32 mmol/L 29  Calcium  8.9 - 10.3 mg/dL 9.1  Total Protein 6.5 - 8.1 g/dL 6.4(L)  Total Bilirubin 0.3 - 1.2 mg/dL 1.1  Alkaline Phos 38 - 126 U/L 76  AST 15 - 41 U/L 19  ALT 14 - 54 U/L 30  CBC Latest Ref Rng & Units 10/08/2016  WBC 3.6 - 11.0 K/uL 2.1(L)  Hemoglobin 12.0 - 16.0 g/dL 13.8  Hematocrit 35.0 - 47.0 % 40.7  Platelets 150 - 440 K/uL 95(L)      Assessment and plan- Patient is a 66 y.o. 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  Counts ok to proceed with cycle 2 of carbo/taxol. Decrease benadryl dose to 25 mg. She will compleet 3 cycles of chemo and will get WPRT after that per gyn onc recommendations. I will refer her to [place] at this time. See her back in 3 weeks for cycle 3 of chemotherapy. Plan for port removal after that. Port site discomfort- continue to monitor. No signs of infection  neulasta associated bone pain- she will take tylenol+ claritin fisrt. If not controlled- occasional NSAIDs and oxycodone   Visit Diagnosis 1. Endometrial cancer ([place])   2. Encounter for antineoplastic chemotherapy      Dr. Randa Evens, MD, MPH [facility] Pager- [phone] 10/22/2016 9:34 AM

## 2016-10-22 NOTE — Progress Notes (Signed)
Called Dr. Janese Banks about Mrs. Matsuura feeling anxious during her treatment. Ordered to give 0.5 mg of Ativan. Medication given and was effective. Will continue to monitor

## 2016-10-26 DIAGNOSIS — I890 Lymphedema, not elsewhere classified: Secondary | ICD-10-CM | POA: Diagnosis not present

## 2016-11-06 ENCOUNTER — Ambulatory Visit (INDEPENDENT_AMBULATORY_CARE_PROVIDER_SITE_OTHER): Payer: PPO

## 2016-11-06 VITALS — BP 110/66 | HR 90 | Temp 98.1°F | Resp 14 | Ht 62.0 in | Wt 177.8 lb

## 2016-11-06 DIAGNOSIS — Z1331 Encounter for screening for depression: Secondary | ICD-10-CM

## 2016-11-06 DIAGNOSIS — Z0000 Encounter for general adult medical examination without abnormal findings: Secondary | ICD-10-CM | POA: Diagnosis not present

## 2016-11-06 DIAGNOSIS — Z1389 Encounter for screening for other disorder: Secondary | ICD-10-CM

## 2016-11-06 NOTE — Progress Notes (Signed)
Subjective:   Jaime Hall is a 66 y.o. female who presents for Medicare Annual (Subsequent) preventive examination.  Review of Systems:  No ROS.  Medicare Wellness Visit.  Cardiac Risk Factors include: advanced age (>36men, >79 women);obesity (BMI >30kg/m2)     Objective:     Vitals: BP 110/66 (BP Location: Left Arm, Patient Position: Sitting, Cuff Size: Normal)   Pulse 90   Temp 98.1 F (36.7 C) (Oral)   Resp 14   Ht 5\' 2"  (1.575 m)   Wt 177 lb 12.8 oz (80.6 kg)   SpO2 98%   BMI 32.52 kg/m   Body mass index is 32.52 kg/m.   Tobacco History  Smoking Status  . Former Smoker  . Packs/day: 1.00  . Years: 30.00  . Types: Cigarettes  . Quit date: 07/24/2002  Smokeless Tobacco  . Never Used    Comment: quit smoking in 2000     Counseling given: Not Answered   Past Medical History:  Diagnosis Date  . Anxiety   . Cervicalgia   . Coronary artery disease    a. 02/2012 Stress echo: severe anterior wall ischemia;  b. 02/2012 Cath/PCI: LAD 95p (3.0 x 15 Xience EX DES), D1 90ost (PTCA - bifurcational dzs), EF 45% with anterior HK;  b. 02/2013 Ex MV: fixed anterior defect w/ minor reversibility, nl EF-->Med Rx.  . Endometrial cancer ([place])    a. 07/2016 s/p robotic hysterectomy, BSO w/ washings, sentinel node inj, mapping, bx, adhesiolysis.  . Essential hypertension, benign   . Fibrocystic breast disease   . GERD (gastroesophageal reflux disease)   . Gestational hypertension   . Heart murmur   . History of anemia   . History of blood transfusion   . Hodgkin's lymphoma ([place]) 2011   a. s/p radiation and chemo therapy  . Osteoarthritis   . Polycystic ovarian disease    Past Surgical History:  Procedure Laterality Date  . ABDOMINAL HYSTERECTOMY    . bladder sling    . CARDIAC CATHETERIZATION  02/2012   [facility] 1 stent place  . CERVICAL POLYPECTOMY    . CHOLECYSTECTOMY  1982  . COLONOSCOPY WITH PROPOFOL N/A 02/05/2015   Procedure: COLONOSCOPY WITH PROPOFOL;  Surgeon:  Lucilla Lame, MD;  Location: [facility];  Service: Endoscopy;  Laterality: N/A;  . CORONARY ANGIOPLASTY  02/2012   left/right s/p balloon  . CYSTOGRAM N/A 08/17/2016   Procedure: CYSTOGRAM;  Surgeon: Hollice Espy, MD;  Location: [facility];  Service: Urology;  Laterality: N/A;  . CYSTOSCOPY N/A 08/17/2016   Procedure: CYSTOSCOPY EXAM UNDER ANESTHESIA;  Surgeon: Hollice Espy, MD;  Location: [facility];  Service: Urology;  Laterality: N/A;  . CYSTOSCOPY W/ RETROGRADES Bilateral 08/17/2016   Procedure: CYSTOSCOPY WITH RETROGRADE PYELOGRAM;  Surgeon: Hollice Espy, MD;  Location: [facility];  Service: Urology;  Laterality: Bilateral;  . CYSTOSCOPY WITH STENT PLACEMENT Right 08/17/2016   Procedure: CYSTOSCOPY WITH STENT PLACEMENT;  Surgeon: Hollice Espy, MD;  Location: [facility];  Service: Urology;  Laterality: Right;  . heart stent'  2013  . kidney stent Right 2018  . LYMPH NODE BIOPSY  2011   diagnosis of hodgkins lymphoma  . PELVIC LYMPH NODE DISSECTION N/A 07/29/2016   Procedure: PELVIC/AORTIC LYMPH NODE SAMPLING;  Surgeon: [name], MD;  Location: [facility];  Service: Gynecology;  Laterality: N/A;  . PORTA CATH INSERTION N/A 09/22/2016   Procedure: Glori Luis Cath Insertion;  Surgeon: [name], MD;  Location: [place] [facility];  Service: Cardiovascular;  Laterality: N/A;  . ROBOTIC ASSISTED TOTAL HYSTERECTOMY WITH BILATERAL SALPINGO OOPHERECTOMY N/A 07/29/2016   Procedure: ROBOTIC ASSISTED TOTAL HYSTERECTOMY WITH BILATERAL SALPINGO OOPHORECTOMY;  Surgeon: [name], MD;  Location: [facility];  Service: Gynecology;  Laterality: N/A;  . SENTINEL NODE BIOPSY N/A 07/29/2016   Procedure: SENTINEL NODE BIOPSY;  Surgeon: [name], MD;  Location: [facility];  Service: Gynecology;  Laterality: N/A;  . transobturator sling N/A 2009   [place]   Family History  Problem Relation Age of Onset  . ALS Father   . Polymyositis Father   . Diabetes Brother   . Cancer  Maternal Aunt        breast  . Breast cancer Maternal Aunt        30's  . Stroke Maternal Grandmother   . Cancer Maternal Grandfather        prostate  . Stroke Maternal Grandfather    History  Sexual Activity  . Sexual activity: No    Outpatient Encounter Prescriptions as of 11/06/2016  Medication Sig  . acetaminophen (TYLENOL) 500 MG tablet Take 1,000 mg by mouth every 6 (six) hours as needed.  Marland Kitchen aspirin EC 81 MG tablet Take 81 mg by mouth at bedtime.  Marland Kitchen atorvastatin (LIPITOR) 10 MG tablet TAKE ONE TABLET BY MOUTH ONCE DAILY (Patient taking differently: Take 10 mg by mouth at bedtime. )  . BIOTIN 5000 PO Take 1 Dose by mouth daily.  . Cholecalciferol (VITAMIN D3) 2000 units TABS Take 2,000 Units by mouth 3 (three) times a week.  Marland Kitchen dexamethasone (DECADRON) 4 MG tablet Take 2 tablets (8 mg total) by mouth daily. Start the day after chemotherapy for 2 days.  . diazepam (VALIUM) 5 MG tablet Take 1 tablet (5 mg total) by mouth every 12 (twelve) hours as needed for anxiety.  Marland Kitchen esomeprazole (NEXIUM) 20 MG capsule Take 20 mg by mouth daily before breakfast.   . ibuprofen (ADVIL,MOTRIN) 200 MG tablet Take 200 mg by mouth every 6 (six) hours as needed.  . lidocaine-prilocaine (EMLA) cream Apply to affected area once  . LORazepam (ATIVAN) 0.5 MG tablet Take 1 tablet (0.5 mg total) by mouth every 6 (six) hours as needed (Nausea or vomiting).  . metoprolol tartrate (LOPRESSOR) 25 MG tablet TAKE ONE TABLET BY MOUTH TWICE DAILY  . naproxen sodium (ANAPROX) 220 MG tablet Take 440 mg by mouth 2 (two) times daily as needed (for pain/headache.).  Marland Kitchen nitroGLYCERIN (NITROSTAT) 0.4 MG SL tablet Place 1 tablet (0.4 mg total) under the tongue every 5 (five) minutes as needed for chest pain.  Marland Kitchen ondansetron (ZOFRAN) 8 MG tablet Take 1 tablet (8 mg total) by mouth 2 (two) times daily as needed for refractory nausea / vomiting. Start on day 3 after chemo.  Marland Kitchen oxyCODONE (OXY IR/ROXICODONE) 5 MG immediate release  tablet Take 1 tablet (5 mg total) by mouth every 6 (six) hours as needed for severe pain.  Marland Kitchen prochlorperazine (COMPAZINE) 10 MG tablet Take 1 tablet (10 mg total) by mouth every 6 (six) hours as needed (Nausea or vomiting).  . Turmeric 500 MG CAPS Take 500 mg by mouth 2 (two) times daily.  . vitamin B-12 (CYANOCOBALAMIN) 1000 MCG tablet Take 1,000 mcg by mouth daily.  . [DISCONTINUED] metroNIDAZOLE (FLAGYL) 500 MG tablet    No facility-administered encounter medications on file as of 11/06/2016.     Activities of Daily Living In your present state of health, do you have any difficulty performing the following activities: 11/06/2016  08/05/2016  Hearing? N N  Vision? N N  Difficulty concentrating or making decisions? N N  Walking or climbing stairs? Y N  Dressing or bathing? N N  Doing errands, shopping? N Y  Conservation officer, nature and eating ? N -  Using the Toilet? N -  In the past six months, have you accidently leaked urine? N -  Do you have problems with loss of bowel control? N -  Managing your Medications? N -  Managing your Finances? N -  Housekeeping or managing your Housekeeping? N -  Some recent data might be hidden    Patient Care Team: [name], MD as PCP - General (Internal Medicine) [name], MD as Consulting Physician (General Surgery) [name], RN as Registered Nurse    Assessment:    This is a routine wellness examination for [place]. The goal of the wellness visit is to assist the patient how to close the gaps in care and create a preventative care plan for the patient.   Taking calcium VIT D as appropriate/Osteoporosis risk reviewed.  Medications reviewed; taking without issues or barriers.  Safety issues reviewed; smoke detectors in the home. No firearms in the home.  Wears seatbelts when driving or riding with others. Patient does wear sunscreen or protective clothing when in direct sunlight. No violence in the home.  Depression-  PHQ 2 &9 complete.  No signs/symptoms or verbal communication regarding little pleasure in doing things, feeling down, depressed or hopeless. No changes in sleeping, energy, eating, concentrating.  No thoughts of self harm or harm towards others.  Time spent on this topic is 8 minutes.   Patient is alert, normal appearance, oriented to person/place/and time. Correctly identified the president of the [place], recall of 3/3 words, and performing simple calculations.  Patient displays appropriate judgement and can read correct time from watch face.  No new identified risk were noted.  No failures at ADL's or IADL's.   BMI- discussed the importance of a healthy diet, water intake and exercise. Educational material provided.   Diet: Lunch provided by employer (school system) which is geared to be a healthy diet and as much as tolerated during chemo treatment.    Daily fluid intake: 1 cups of caffeine, 5 cups of water  Dental- every six months.  Dr.Tammy Artis ([place])  Eye- Visual acuity not assessed per patient preference since they have regular follow up with the ophthalmologist.  Wears corrective lenses.  Sleep patterns- Sleeps 4 hours at night.  Wakes feeling rested. Naps as needed.  Pneumovax 23 vaccine discussed and declined per patient preference.  Patient Concerns: Currently undergoing chemo treatments.                 Exercise Activities and Dietary recommendations Current Exercise Habits: Home exercise routine, Type of exercise: walking, Time (Minutes): 20, Frequency (Times/Week): 4, Weekly Exercise (Minutes/Week): 80, Intensity: Mild  Goals    . Healthy Lifestyle          Stay hydrated and drink plenty of fluids/water. Lean meats, vegetables. Stay active and start chair/standing exercises.        Fall Risk Fall Risk  11/06/2016 10/22/2016 11/07/2015 10/29/2014 10/29/2014  Falls in the past year? No No No No No   Depression Screen PHQ 2/9 Scores 11/06/2016 11/07/2015 10/29/2014  PHQ -  2 Score 0 2 0  PHQ- 9 Score 0 4 -     Cognitive Function MMSE - Mini Mental State Exam 11/06/2016 11/07/2015  Orientation to time 5 5  Orientation to Place 5 5  Registration 3 3  Attention/ Calculation 5 5  Recall 3 3  Language- name 2 objects 2 2  Language- repeat 1 1  Language- follow 3 step command 3 3  Language- read & follow direction 1 1  Write a sentence 1 1  Copy design 1 1  Total score 30 30        Immunization History  Administered Date(s) Administered  . Pneumococcal Conjugate-13 04/29/2015  . Tdap 10/05/2011   Screening Tests Health Maintenance  Topic Date Due  . PNA vac Low Risk Adult (2 of 2 - PPSV23) 04/28/2016  . INFLUENZA VACCINE  06/14/2018 (Originally 01/13/2017)  . MAMMOGRAM  12/25/2017  . COLONOSCOPY  02/05/2020  . TETANUS/TDAP  10/04/2021  . DEXA SCAN  Completed  . Hepatitis C Screening  Completed      Plan:    End of life planning; Advance aging; Advanced directives discussed. Copy of current HCPOA/Living Will declined.    I have personally reviewed and noted the following in the patient's chart:   . Medical and social history . Use of alcohol, tobacco or illicit drugs  . Current medications and supplements . Functional ability and status . Nutritional status . Physical activity . Advanced directives . List of other physicians . Hospitalizations, surgeries, and ER visits in previous 12 months . Vitals . Screenings to include cognitive, depression, and falls . Referrals and appointments  In addition, I have reviewed and discussed with patient certain preventive protocols, quality metrics, and best practice recommendations. A written personalized care plan for preventive services as well as general preventive health recommendations were provided to patient.     OBrien-Blaney, Frenchie Dangerfield L, LPN  09/05/4008    I have reviewed the above information and agree with above.   Deborra Medina, MD

## 2016-11-06 NOTE — Patient Instructions (Addendum)
  Jaime Hall , Thank you for taking time to come for your Medicare Wellness Visit. I appreciate your ongoing commitment to your health goals. Please review the following plan we discussed and let me know if I can assist you in the future.   Follow up with [name] as needed.    Have a great day!  These are the goals we discussed: Goals    . Healthy Lifestyle          Stay hydrated and drink plenty of fluids/water. Lean meats, vegetables. Stay active and start chair/standing exercises.         This is a list of the screening recommended for you and due dates:  Health Maintenance  Topic Date Due  . Pneumonia vaccines (2 of 2 - PPSV23) 04/28/2016  . Flu Shot  06/14/2018*  . Mammogram  12/25/2017  . Colon Cancer Screening  02/05/2020  . Tetanus Vaccine  10/04/2021  . DEXA scan (bone density measurement)  Completed  .  Hepatitis C: One time screening is recommended by Center for Disease Control  (CDC) for  adults born from [birth year] through 1966.   Completed  *Topic was postponed. The date shown is not the original due date.

## 2016-11-11 NOTE — Progress Notes (Signed)
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 [name] grade 1 endometrial cancer when she presented with postmenopausal bleeding in February 2018. She underwent robotic hysterectomy and bilateral salpingo-oophorectomy with washings and sentinel lymph node injection and mapping and biopsy on 07/29/2016. She was found to have extensive pelvic adhesive disease and underwent elevated pelvic adhesiolysis including enteric and ovariolysis. The cul de sac was obliterated and adherent to posterior cervix.-was negative.  2. Pathology showed: Pathology DIAGNOSIS:  A. SENTINEL LYMPH NODE, RIGHT MID OBTURATOR; EXCISION:  - NO TUMOR SEEN IN ONE LYMPH NODE (0/1).   B. SENTINEL LYMPH NODE, RIGHT EXTERNAL ILIAC; EXCISION:  - NO TUMOR SEEN IN ONE LYMPH NODE (0/1).   C. SENTINEL LYMPH NODE, LEFT EXTERNAL ILIAC; EXCISION:  - NO TUMOR SEEN IN ONE LYMPH NODE (0/1).   D. UTERUS WITH CERVIX, BILATERAL FALLOPIAN TUBES AND OVARIES;  HYSTERECTOMY AND BILATERAL SALPINGO-OOPHORECTOMY:  - SEROUS CARCINOMA, 4.5 CM(SEE NOTE).  - LYMPHOVASCULAR INVASION PRESENT.  - NO TUMOR SEEN IN BILATERAL OVARIES AND FALLOPIAN TUBES.    ENDOMETRIUM:  Procedure: Hysterectomy with bilateral salpingo-oophorectomy  Histologic Type: Serous carcinoma  Myometrial Invasion: Present  Depth of invasion (millimeters): 13 mm  Myometrial thickness  (millimeters): 14 mm  Percentage of myometrial invasion: 93%  Uterine Serosa Involvement: Not identified  Cervical Stromal Involvement: Not identified   BIOMARKER REPORTING TEMPLATE:  p53 Expression: Abnormal strong diffuse overexpression (>90%)  ER/PR negative. Her2 pending MS stable  3. She presented with fever on 08/05/2016 and CT scan showed 1.8 x 3.8 cm complex fluid collection containing pockets of air within somewhat organized walls within the pelvis and the hysterectomy bed most consistent with a developing abscess. There is a superior extension of the loculated fluid along the left lateral pelvic wall anterior to the left psoas muscle measuring 2 x 4 cm and another fluid collection along the anterior surface of the left psoas muscle measuring 1.8 x 3.6 cm. Blood cultures were positive for bacteria growing his fragilis. Urine cultures were negative. Patient was treated with IV antibiotics and discharged on Augmentin.  4. 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 [name] from urology on 08/14/2016. No leak was seen on CT urogram.  5. On 08/17/2016 patient underwentExam under anesthesia, vaginoscopy, cystoscopy, cystogram with interpretation of fluoroscopy less than 30 minutes, bilateral retrograde pyelogram, and right ureteral stent placement. Intraoperative findings: No obvious ureterovaginal vesicovaginal fistula appreciated. Mild right hydroureteronephrosis down to level of the distal ureter with slight medial deviation without filling defects. Slight mucopurulent drainage from left apical vaginal cuff, specimen cultured. Culture revealed bateroides fragilis and she was started on a 2 week course of Flagyl iniated on 08/24/2016 which she has now completed  6. Repeat CT abdomen on 09/10/2016 IMPRESSION: 1. Interval resolution of abscess  involving the vaginal cuff. 2. The proximal end of the right  ureteral stent is coiled in the proximal right ureter. No hydronephrosis. 3. No evidence of metastatic disease or other significant changes. 4. Aortic atherosclerosis   7. Patient has been recovering slowly following her complicated postoperative course. Currently she reports no fevers or vaginal leakage. Does report some fatigue but denies other complaints. She lives alone and is independent of her ADLs and IADLs. She has a prior history of Hodgkin's lymphoma in 2011 treated with anthracycline-based chemotherapy  8. There was concern about port infection and was looked at by vascular surgery. They did not think it was infected and recommend conservative management. I also spoke to Dr. [name] and plan is to proceed with 3 cycles of chemotherapy followed by WPRT   Interval history- does report some burning pain in her fingertips and soles which started after cycle 2 of chemotherapy. Denies any numbness or sensory loss  ECOG PS- 1 Pain scale- 0 Opioid associated constipation- 0  Review of systems- Review of Systems  Constitutional: Negative for chills, fever, malaise/fatigue and weight loss.  HENT: Negative for congestion, ear discharge and nosebleeds.   Eyes: Negative for blurred vision.  Respiratory: Negative for cough, hemoptysis, sputum production, shortness of breath and wheezing.   Cardiovascular: Negative for chest pain, palpitations, orthopnea and claudication.  Gastrointestinal: Negative for abdominal pain, blood in stool, constipation, diarrhea, heartburn, melena, nausea and vomiting.  Genitourinary: Negative for dysuria, flank pain, frequency, hematuria and urgency.  Musculoskeletal: Negative for back pain, joint pain and myalgias.  Skin: Negative for rash.  Neurological: Negative for dizziness, tingling, focal weakness, seizures, weakness and headaches.       Burning pain in fingertips and soles    Endo/Heme/Allergies: Does not bruise/bleed easily.  Psychiatric/Behavioral: Negative for depression and suicidal ideas. The patient does not have insomnia.        Allergies  Allergen Reactions  . Adhesive [Tape] Other (See Comments)    Burns the skin  . Z-Pak [Azithromycin] Itching  . Antifungal [Miconazole Nitrate] Rash  . Sulfa Antibiotics Nausea And Vomiting and Rash    N/T     Past Medical History:  Diagnosis Date  . Anxiety   . Cervicalgia   . Coronary artery disease    a. 02/2012 Stress echo: severe anterior wall ischemia;  b. 02/2012 Cath/PCI: LAD 95p (3.0 x 15 Xience EX DES), D1 90ost (PTCA - bifurcational dzs), EF 45% with anterior HK;  b. 02/2013 Ex MV: fixed anterior defect w/ minor reversibility, nl EF-->Med Rx.  . Endometrial cancer ([place])    a. 07/2016 s/p robotic hysterectomy, BSO w/ washings, sentinel node inj, mapping, bx, adhesiolysis.  . Essential hypertension, benign   . Fibrocystic breast disease   . GERD (gastroesophageal reflux disease)   . Gestational hypertension   . Heart murmur   . History of anemia   . History of blood transfusion   . Hodgkin's lymphoma ([place]) 2011   a. s/p radiation and chemo therapy  . Osteoarthritis   . Polycystic ovarian disease      Past Surgical History:  Procedure Laterality Date  . ABDOMINAL HYSTERECTOMY    . bladder sling    . CARDIAC CATHETERIZATION  02/2012   [facility] 1 stent place  . CERVICAL POLYPECTOMY    . CHOLECYSTECTOMY  1982  . COLONOSCOPY WITH PROPOFOL N/A 02/05/2015   Procedure: COLONOSCOPY WITH PROPOFOL;  Surgeon: Lucilla Lame, MD;  Location: [facility] ENDOSCOPY;  Service: Endoscopy;  Laterality: N/A;  . CORONARY ANGIOPLASTY  02/2012   left/right s/p balloon  .  CYSTOGRAM N/A 08/17/2016   Procedure: CYSTOGRAM;  Surgeon: Hollice Espy, MD;  Location: [facility] ORS;  Service: Urology;  Laterality: N/A;  . CYSTOSCOPY N/A 08/17/2016   Procedure: CYSTOSCOPY EXAM UNDER ANESTHESIA;  Surgeon: Hollice Espy, MD;  Location: [facility]  ORS;  Service: Urology;  Laterality: N/A;  . CYSTOSCOPY W/ RETROGRADES Bilateral 08/17/2016   Procedure: CYSTOSCOPY WITH RETROGRADE PYELOGRAM;  Surgeon: Hollice Espy, MD;  Location: [facility] ORS;  Service: Urology;  Laterality: Bilateral;  . CYSTOSCOPY WITH STENT PLACEMENT Right 08/17/2016   Procedure: CYSTOSCOPY WITH STENT PLACEMENT;  Surgeon: Hollice Espy, MD;  Location: [facility] ORS;  Service: Urology;  Laterality: Right;  . heart stent'  2013  . kidney stent Right 2018  . LYMPH NODE BIOPSY  2011   diagnosis of hodgkins lymphoma  . PELVIC LYMPH NODE DISSECTION N/A 07/29/2016   Procedure: PELVIC/AORTIC LYMPH NODE SAMPLING;  Surgeon: [name], MD;  Location: [facility] ORS;  Service: Gynecology;  Laterality: N/A;  . PORTA CATH INSERTION N/A 09/22/2016   Procedure: Glori Luis Cath Insertion;  Surgeon: [name], MD;  Location: [place] [facility];  Service: Cardiovascular;  Laterality: N/A;  . ROBOTIC ASSISTED TOTAL HYSTERECTOMY WITH BILATERAL SALPINGO OOPHERECTOMY N/A 07/29/2016   Procedure: ROBOTIC ASSISTED TOTAL HYSTERECTOMY WITH BILATERAL SALPINGO OOPHORECTOMY;  Surgeon: [name], MD;  Location: [facility] ORS;  Service: Gynecology;  Laterality: N/A;  . SENTINEL NODE BIOPSY N/A 07/29/2016   Procedure: SENTINEL NODE BIOPSY;  Surgeon: [name], MD;  Location: [facility] ORS;  Service: Gynecology;  Laterality: N/A;  . transobturator sling N/A 2009   [place] History   Social History  . Marital status: Widowed    Spouse name: N/A  . Number of children: N/A  . Years of education: N/A   Occupational History  . Not on file.   Social History Main Topics  . Smoking status: Former Smoker    Packs/day: 1.00    Years: 30.00    Types: Cigarettes    Quit date: 07/24/2002  . Smokeless tobacco: Never Used     Comment: quit smoking in 2000  . Alcohol use Yes     Comment: occasional, 1-2 drinks per month  . Drug use: No  . Sexual activity: No   Other Topics  Concern  . Not on file   Social History Narrative  . No narrative on file    Family History  Problem Relation Age of Onset  . ALS Father   . Polymyositis Father   . Diabetes Brother   . Cancer Maternal Aunt        breast  . Breast cancer Maternal Aunt        30's  . Stroke Maternal Grandmother   . Cancer Maternal Grandfather        prostate  . Stroke Maternal Grandfather      Current Outpatient Prescriptions:  .  acetaminophen (TYLENOL) 500 MG tablet, Take 1,000 mg by mouth every 6 (six) hours as needed., Disp: , Rfl:  .  aspirin EC 81 MG tablet, Take 81 mg by mouth at bedtime., Disp: , Rfl:  .  atorvastatin (LIPITOR) 10 MG tablet, TAKE ONE TABLET BY MOUTH ONCE DAILY (Patient taking differently: Take 10 mg by mouth at bedtime. ), Disp: 90 tablet, Rfl: 3 .  BIOTIN 5000 PO, Take 1 Dose by mouth daily., Disp: , Rfl:  .  Cholecalciferol (VITAMIN D3) 2000 units TABS, Take 2,000 Units by mouth 3 (three) times a week., Disp: ,  Rfl:  .  dexamethasone (DECADRON) 4 MG tablet, Take 2 tablets (8 mg total) by mouth daily. Start the day after chemotherapy for 2 days., Disp: 30 tablet, Rfl: 1 .  diazepam (VALIUM) 5 MG tablet, Take 1 tablet (5 mg total) by mouth every 12 (twelve) hours as needed for anxiety., Disp: 30 tablet, Rfl: 0 .  esomeprazole (NEXIUM) 20 MG capsule, Take 20 mg by mouth daily before breakfast. , Disp: , Rfl:  .  ibuprofen (ADVIL,MOTRIN) 200 MG tablet, Take 200 mg by mouth every 6 (six) hours as needed., Disp: , Rfl:  .  lidocaine-prilocaine (EMLA) cream, Apply to affected area once, Disp: 30 g, Rfl: 3 .  LORazepam (ATIVAN) 0.5 MG tablet, Take 1 tablet (0.5 mg total) by mouth every 6 (six) hours as needed (Nausea or vomiting)., Disp: 30 tablet, Rfl: 0 .  metoprolol tartrate (LOPRESSOR) 25 MG tablet, TAKE ONE TABLET BY MOUTH TWICE DAILY, Disp: 180 tablet, Rfl: 2 .  naproxen sodium (ANAPROX) 220 MG tablet, Take 440 mg by mouth 2 (two) times daily as needed (for  pain/headache.)., Disp: , Rfl:  .  nitroGLYCERIN (NITROSTAT) 0.4 MG SL tablet, Place 1 tablet (0.4 mg total) under the tongue every 5 (five) minutes as needed for chest pain., Disp: 25 tablet, Rfl: 3 .  ondansetron (ZOFRAN) 8 MG tablet, Take 1 tablet (8 mg total) by mouth 2 (two) times daily as needed for refractory nausea / vomiting. Start on day 3 after chemo., Disp: 30 tablet, Rfl: 1 .  oxyCODONE (OXY IR/ROXICODONE) 5 MG immediate release tablet, Take 1 tablet (5 mg total) by mouth every 6 (six) hours as needed for severe pain., Disp: 30 tablet, Rfl: 0 .  prochlorperazine (COMPAZINE) 10 MG tablet, Take 1 tablet (10 mg total) by mouth every 6 (six) hours as needed (Nausea or vomiting)., Disp: 30 tablet, Rfl: 1 .  Turmeric 500 MG CAPS, Take 500 mg by mouth 2 (two) times daily., Disp: , Rfl:  .  vitamin B-12 (CYANOCOBALAMIN) 1000 MCG tablet, Take 1,000 mcg by mouth daily., Disp: , Rfl:   Physical exam:  Vitals:   11/12/16 0906  BP: 118/67  Pulse: 84  Resp: 18  Temp: (!) 96.9 F (36.1 C)  TempSrc: Tympanic  Weight: 176 lb 9.6 oz (80.1 kg)   Physical Exam  Constitutional: She is oriented to person, place, and time and well-developed, well-nourished, and in no distress.  HENT:  Head: Normocephalic and atraumatic.  Eyes: EOM are normal. Pupils are equal, round, and reactive to light.  Neck: Normal range of motion.  Cardiovascular: Normal rate, regular rhythm and normal heart sounds.   Pulmonary/Chest: Effort normal and breath sounds normal.  Abdominal: Soft. Bowel sounds are normal.  Neurological: She is alert and oriented to person, place, and time.  Skin: Skin is warm and dry.     CMP Latest Ref Rng & Units 11/12/2016  Glucose 65 - 99 mg/dL 114(H)  BUN 6 - 20 mg/dL 23(H)  Creatinine 0.44 - 1.00 mg/dL 1.14(H)  Sodium 135 - 145 mmol/L 139  Potassium 3.5 - 5.1 mmol/L 4.4  Chloride 101 - 111 mmol/L 108  CO2 22 - 32 mmol/L 26  Calcium 8.9 - 10.3 mg/dL 9.3  Total Protein 6.5 - 8.1  g/dL 6.9  Total Bilirubin 0.3 - 1.2 mg/dL 1.1  Alkaline Phos 38 - 126 U/L 81  AST 15 - 41 U/L 24  ALT 14 - 54 U/L 19   CBC Latest Ref Rng & Units 11/12/2016  WBC 3.6 - 11.0 K/uL 5.8  Hemoglobin 12.0 - 16.0 g/dL 11.4(L)  Hematocrit 35.0 - 47.0 % 32.4(L)  Platelets 150 - 440 K/uL 250     Assessment and plan- Patient is a 66 y.o. 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  Counts ok to proceed with cycle 3 of carbo/taxol. She will be seeing Dr. Baruch Gouty later today to discuss WPRT. We will plan for port removal after this cycle. I will see her back in 8 weeks time after she completes WPRT. Check cbc, cmp and CA 125 at that time. She will also continue to f/u with Dr. [name] for pelvic exams upon completion of WPRT  Chemo induced peripheral neuropathy- I will dose reduce taxol by 25% to 150 mg/meter square given symtpoms of peripheral neuropathy. Patient has tried gabapentin the past and it has not helped her. She says turmeric helps her with those symptoms and I don't see any harm in trying that for limited time frame. She could try lyrica 75 mg BID instead of neurontin as well   Visit Diagnosis 1. Endometrial cancer ([place])   2. Encounter for antineoplastic chemotherapy   3. Chemotherapy-induced neuropathy ([place])      Dr. Randa Evens, MD, MPH [facility] Pager- [phone] 11/12/2016 9:32 AM

## 2016-11-12 ENCOUNTER — Inpatient Hospital Stay: Payer: PPO

## 2016-11-12 ENCOUNTER — Inpatient Hospital Stay (HOSPITAL_BASED_OUTPATIENT_CLINIC_OR_DEPARTMENT_OTHER): Payer: PPO | Admitting: Oncology

## 2016-11-12 ENCOUNTER — Encounter: Payer: Self-pay | Admitting: Oncology

## 2016-11-12 ENCOUNTER — Ambulatory Visit
Admission: RE | Admit: 2016-11-12 | Discharge: 2016-11-12 | Disposition: A | Discharge status: Home / Self Care | Payer: PPO | Source: Ambulatory Visit | Attending: Radiation Oncology | Admitting: Radiation Oncology

## 2016-11-12 VITALS — BP 118/67 | HR 84 | Temp 96.9°F | Resp 18 | Wt 176.6 lb

## 2016-11-12 DIAGNOSIS — E282 Polycystic ovarian syndrome: Secondary | ICD-10-CM

## 2016-11-12 DIAGNOSIS — I700 Atherosclerosis of aorta: Secondary | ICD-10-CM | POA: Diagnosis not present

## 2016-11-12 DIAGNOSIS — G620 Drug-induced polyneuropathy: Secondary | ICD-10-CM

## 2016-11-12 DIAGNOSIS — Z79899 Other long term (current) drug therapy: Secondary | ICD-10-CM

## 2016-11-12 DIAGNOSIS — Z5111 Encounter for antineoplastic chemotherapy: Secondary | ICD-10-CM

## 2016-11-12 DIAGNOSIS — Z87891 Personal history of nicotine dependence: Secondary | ICD-10-CM

## 2016-11-12 DIAGNOSIS — M1990 Unspecified osteoarthritis, unspecified site: Secondary | ICD-10-CM

## 2016-11-12 DIAGNOSIS — C541 Malignant neoplasm of endometrium: Secondary | ICD-10-CM | POA: Diagnosis not present

## 2016-11-12 DIAGNOSIS — Z90722 Acquired absence of ovaries, bilateral: Secondary | ICD-10-CM

## 2016-11-12 DIAGNOSIS — N736 Female pelvic peritoneal adhesions (postinfective): Secondary | ICD-10-CM | POA: Diagnosis not present

## 2016-11-12 DIAGNOSIS — T451X5A Adverse effect of antineoplastic and immunosuppressive drugs, initial encounter: Secondary | ICD-10-CM

## 2016-11-12 DIAGNOSIS — Z8571 Personal history of Hodgkin lymphoma: Secondary | ICD-10-CM | POA: Diagnosis not present

## 2016-11-12 DIAGNOSIS — I10 Essential (primary) hypertension: Secondary | ICD-10-CM | POA: Diagnosis not present

## 2016-11-12 DIAGNOSIS — Z90710 Acquired absence of both cervix and uterus: Secondary | ICD-10-CM

## 2016-11-12 DIAGNOSIS — G629 Polyneuropathy, unspecified: Secondary | ICD-10-CM | POA: Diagnosis not present

## 2016-11-12 DIAGNOSIS — I2510 Atherosclerotic heart disease of native coronary artery without angina pectoris: Secondary | ICD-10-CM

## 2016-11-12 DIAGNOSIS — Z7982 Long term (current) use of aspirin: Secondary | ICD-10-CM

## 2016-11-12 DIAGNOSIS — K219 Gastro-esophageal reflux disease without esophagitis: Secondary | ICD-10-CM

## 2016-11-12 LAB — COMPREHENSIVE METABOLIC PANEL
ALT: 19 U/L (ref 14–54)
AST: 24 U/L (ref 15–41)
Albumin: 4 g/dL (ref 3.5–5.0)
Alkaline Phosphatase: 81 U/L (ref 38–126)
Anion gap: 5 (ref 5–15)
BUN: 23 mg/dL — ABNORMAL HIGH (ref 6–20)
CO2: 26 mmol/L (ref 22–32)
Calcium: 9.3 mg/dL (ref 8.9–10.3)
Chloride: 108 mmol/L (ref 101–111)
Creatinine, Ser: 1.14 mg/dL — ABNORMAL HIGH (ref 0.44–1.00)
GFR calc Af Amer: 56 mL/min — ABNORMAL LOW (ref >60)
GFR calc non Af Amer: 49 mL/min — ABNORMAL LOW (ref >60)
Glucose, Bld: 114 mg/dL — ABNORMAL HIGH (ref 65–99)
Potassium: 4.4 mmol/L (ref 3.5–5.1)
Sodium: 139 mmol/L (ref 135–145)
Total Bilirubin: 1.1 mg/dL (ref 0.3–1.2)
Total Protein: 6.9 g/dL (ref 6.5–8.1)

## 2016-11-12 LAB — CBC WITH DIFFERENTIAL/PLATELET
Basophils Absolute: 0.1 10*3/uL (ref 0–0.1)
Basophils Relative: 1 %
Eosinophils Absolute: 0.1 10*3/uL (ref 0–0.7)
Eosinophils Relative: 1 %
HCT: 32.4 % — ABNORMAL LOW (ref 35.0–47.0)
Hemoglobin: 11.4 g/dL — ABNORMAL LOW (ref 12.0–16.0)
Lymphocytes Relative: 20 %
Lymphs Abs: 1.1 10*3/uL (ref 1.0–3.6)
MCH: 31.4 pg (ref 26.0–34.0)
MCHC: 35 g/dL (ref 32.0–36.0)
MCV: 89.5 fL (ref 80.0–100.0)
Monocytes Absolute: 0.5 10*3/uL (ref 0.2–0.9)
Monocytes Relative: 8 %
Neutro Abs: 4 10*3/uL (ref 1.4–6.5)
Neutrophils Relative %: 70 %
Platelets: 250 10*3/uL (ref 150–440)
RBC: 3.62 MIL/uL — ABNORMAL LOW (ref 3.80–5.20)
RDW: 16 % — ABNORMAL HIGH (ref 11.5–14.5)
WBC: 5.8 10*3/uL (ref 3.6–11.0)

## 2016-11-12 MED ORDER — HEPARIN SOD (PORK) LOCK FLUSH 100 UNIT/ML IV SOLN
500.0000 [IU] | Freq: Once | INTRAVENOUS | Status: AC
  Administered 2016-11-12: 500 [IU] via INTRAVENOUS
  Filled 2016-11-12: qty 5

## 2016-11-12 MED ORDER — PEGFILGRASTIM 6 MG/0.6ML ~~LOC~~ PSKT
6.0000 mg | PREFILLED_SYRINGE | Freq: Once | SUBCUTANEOUS | Status: AC
  Administered 2016-11-12: 6 mg via SUBCUTANEOUS
  Filled 2016-11-12: qty 0.6

## 2016-11-12 MED ORDER — GABAPENTIN 300 MG PO CAPS: 300.0000 mg | ORAL_CAPSULE | Freq: Three times a day (TID) | ORAL | 2 refills | Status: DC

## 2016-11-12 MED ORDER — PALONOSETRON HCL INJECTION 0.25 MG/5ML
0.2500 mg | Freq: Once | INTRAVENOUS | Status: AC
  Administered 2016-11-12: 0.25 mg via INTRAVENOUS
  Filled 2016-11-12: qty 5

## 2016-11-12 MED ORDER — SODIUM CHLORIDE 0.9 % IV SOLN
428.5000 mg | Freq: Once | INTRAVENOUS | Status: AC
  Administered 2016-11-12: 430 mg via INTRAVENOUS
  Filled 2016-11-12: qty 43

## 2016-11-12 MED ORDER — DIPHENHYDRAMINE HCL 50 MG/ML IJ SOLN
25.0000 mg | Freq: Once | INTRAMUSCULAR | Status: AC
  Administered 2016-11-12: 25 mg via INTRAVENOUS
  Filled 2016-11-12: qty 1

## 2016-11-12 MED ORDER — SODIUM CHLORIDE 0.9 % IV SOLN
20.0000 mg | Freq: Once | INTRAVENOUS | Status: AC
  Administered 2016-11-12: 20 mg via INTRAVENOUS
  Filled 2016-11-12: qty 2

## 2016-11-12 MED ORDER — SODIUM CHLORIDE 0.9 % IV SOLN
Freq: Once | INTRAVENOUS | Status: AC
  Administered 2016-11-12: 10:00:00 via INTRAVENOUS
  Filled 2016-11-12: qty 1000

## 2016-11-12 MED ORDER — SODIUM CHLORIDE 0.9% FLUSH
10.0000 mL | Freq: Once | INTRAVENOUS | Status: AC
  Administered 2016-11-12: 10 mL via INTRAVENOUS
  Filled 2016-11-12: qty 10

## 2016-11-12 MED ORDER — DEXTROSE 5 % IV SOLN
135.0000 mg/m2 | Freq: Once | INTRAVENOUS | Status: AC
  Administered 2016-11-12: 258 mg via INTRAVENOUS
  Filled 2016-11-12: qty 43

## 2016-11-12 MED ORDER — FAMOTIDINE IN NACL 20-0.9 MG/50ML-% IV SOLN
20.0000 mg | Freq: Once | INTRAVENOUS | Status: AC
  Administered 2016-11-12: 20 mg via INTRAVENOUS
  Filled 2016-11-12: qty 50

## 2016-11-12 NOTE — Progress Notes (Signed)
Here for f/u  C/o nail bad pain and feet "tingling " c/o

## 2016-11-12 NOTE — Consult Note (Signed)
NEW PATIENT EVALUATION  Name: Jaime Hall  MRN: [id number]  Date:   11/12/2016     DOB: 08-Feb-1951   This 66 y.o. 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  REFERRING PHYSICIAN: [name], MD  CHIEF COMPLAINT: No chief complaint on file.   DIAGNOSIS: There were no encounter diagnoses.   PREVIOUS INVESTIGATIONS:  Pathology report reviewed CT scan reviewed Clinical notes reviewed  HPI: patient is a 66 year old female former patient of mine treated with involved field radiation therapy to her chest for Hodgkin's disease approximate 7 years prior. She started postmenopausal bleeding in February 2018 was seen and underwent robotic hysterectomy and bilateral salpingo-oophorectomy with washings and sentinel node biopsy. She had extensive pelvic adhesive disease. 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 There was no cervical stromal involvement. Tumor had overexpression of p53 ER/PR negative. Postoperative course was complicated by complex fluid collection in the pelvis which were cultured and positive for bacteria and was treated with IV antibiotics and oral Augmentin. She then developed vaginal leaking which was concerning for a vesicovaginal fistula and evaluated by urology no leak was found.she had an exam under anesthesia and no obvious uterovaginal or vesicovaginal fistula was found. There was some mucopurulent drainage from the left apical vaginal cuff. She again was started on antibiotic therapy. Repeat CT scan in March 2018 showed resolution of the vaginal cuff apex.she has had anthracycline therapy as part of her previous Hodgkin's disease. She seen today and infusion receiving her third cycle of carbo  platinumand Taxol. She is telling her treatments well. She specifically denies any further vaginal leak urinary incontinence or abnormal bowel movements. I been asked to evaluate her for possible whole pelvic radiation therapy.  PLANNED TREATMENT REGIMEN: whole pelvic radiation therapy  PAST MEDICAL HISTORY:  has a past medical history of Anxiety; Cervicalgia; Coronary artery disease; Endometrial cancer ([place]); Essential hypertension, benign; Fibrocystic breast disease; GERD (gastroesophageal reflux disease); Gestational hypertension; Heart murmur; History of anemia; History of blood transfusion; Hodgkin's lymphoma ([place]) (2011); Osteoarthritis; and Polycystic ovarian disease.    PAST SURGICAL HISTORY:  Past Surgical History:  Procedure Laterality Date  . ABDOMINAL HYSTERECTOMY    . bladder sling    . CARDIAC CATHETERIZATION  02/2012   [facility] 1 stent place  . CERVICAL POLYPECTOMY    . CHOLECYSTECTOMY  1982  . COLONOSCOPY WITH PROPOFOL N/A 02/05/2015   Procedure: COLONOSCOPY WITH PROPOFOL;  Surgeon: Lucilla Lame, MD;  Location: [facility];  Service: Endoscopy;  Laterality: N/A;  . CORONARY ANGIOPLASTY  02/2012   left/right s/p balloon  . CYSTOGRAM N/A 08/17/2016   Procedure: CYSTOGRAM;  Surgeon: Hollice Espy, MD;  Location: [facility];  Service: Urology;  Laterality: N/A;  . CYSTOSCOPY N/A 08/17/2016   Procedure: CYSTOSCOPY EXAM UNDER ANESTHESIA;  Surgeon: Hollice Espy, MD;  Location: [facility];  Service: Urology;  Laterality: N/A;  . CYSTOSCOPY W/ RETROGRADES Bilateral 08/17/2016   Procedure: CYSTOSCOPY WITH RETROGRADE PYELOGRAM;  Surgeon: Hollice Espy, MD;  Location: [facility];  Service: Urology;  Laterality: Bilateral;  . CYSTOSCOPY WITH STENT PLACEMENT Right 08/17/2016   Procedure: CYSTOSCOPY WITH STENT PLACEMENT;  Surgeon: Hollice Espy, MD;  Location: [facility];  Service: Urology;  Laterality: Right;  . heart stent'  2013  . kidney stent Right 2018  .  LYMPH NODE BIOPSY  2011   diagnosis of  hodgkins lymphoma  . PELVIC LYMPH NODE DISSECTION N/A 07/29/2016   Procedure: PELVIC/AORTIC LYMPH NODE SAMPLING;  Surgeon: [name], MD;  Location: [facility];  Service: Gynecology;  Laterality: N/A;  . PORTA CATH INSERTION N/A 09/22/2016   Procedure: Glori Luis Cath Insertion;  Surgeon: [name], MD;  Location: [facility];  Service: Cardiovascular;  Laterality: N/A;  . ROBOTIC ASSISTED TOTAL HYSTERECTOMY WITH BILATERAL SALPINGO OOPHERECTOMY N/A 07/29/2016   Procedure: ROBOTIC ASSISTED TOTAL HYSTERECTOMY WITH BILATERAL SALPINGO OOPHORECTOMY;  Surgeon: [name], MD;  Location: [facility];  Service: Gynecology;  Laterality: N/A;  . SENTINEL NODE BIOPSY N/A 07/29/2016   Procedure: SENTINEL NODE BIOPSY;  Surgeon: [name], MD;  Location: [facility];  Service: Gynecology;  Laterality: N/A;  . transobturator sling N/A 2009   [place]    FAMILY HISTORY: family history includes ALS in her father; Breast cancer in her maternal aunt; Cancer in her maternal aunt and maternal grandfather; Diabetes in her brother; Polymyositis in her father; Stroke in her maternal grandfather and maternal grandmother.  SOCIAL HISTORY:  reports that she quit smoking about 14 years ago. Her smoking use included Cigarettes. She has a 30.00 pack-year smoking history. She has never used smokeless tobacco. She reports that she drinks alcohol. She reports that she does not use drugs.  ALLERGIES: Adhesive [tape]; Z-pak [azithromycin]; Antifungal [miconazole nitrate]; and Sulfa antibiotics  MEDICATIONS:  Current Outpatient Prescriptions  Medication Sig Dispense Refill  . acetaminophen (TYLENOL) 500 MG tablet Take 1,000 mg by mouth every 6 (six) hours as needed.    Marland Kitchen aspirin EC 81 MG tablet Take 81 mg by mouth at bedtime.    Marland Kitchen atorvastatin (LIPITOR) 10 MG tablet TAKE ONE TABLET BY MOUTH ONCE DAILY (Patient taking differently: Take 10 mg by mouth at bedtime. ) 90 tablet 3  . BIOTIN  5000 PO Take 1 Dose by mouth daily.    . Cholecalciferol (VITAMIN D3) 2000 units TABS Take 2,000 Units by mouth 3 (three) times a week.    Marland Kitchen dexamethasone (DECADRON) 4 MG tablet Take 2 tablets (8 mg total) by mouth daily. Start the day after chemotherapy for 2 days. 30 tablet 1  . diazepam (VALIUM) 5 MG tablet Take 1 tablet (5 mg total) by mouth every 12 (twelve) hours as needed for anxiety. (Patient not taking: Reported on 11/12/2016) 30 tablet 0  . esomeprazole (NEXIUM) 20 MG capsule Take 20 mg by mouth daily before breakfast.     . gabapentin (NEURONTIN) 300 MG capsule Take 1 capsule (300 mg total) by mouth 3 (three) times daily. 90 capsule 2  . ibuprofen (ADVIL,MOTRIN) 200 MG tablet Take 200 mg by mouth every 6 (six) hours as needed.    . lidocaine-prilocaine (EMLA) cream Apply to affected area once 30 g 3  . LORazepam (ATIVAN) 0.5 MG tablet Take 1 tablet (0.5 mg total) by mouth every 6 (six) hours as needed (Nausea or vomiting). (Patient not taking: Reported on 11/12/2016) 30 tablet 0  . metoprolol tartrate (LOPRESSOR) 25 MG tablet TAKE ONE TABLET BY MOUTH TWICE DAILY 180 tablet 2  . naproxen sodium (ANAPROX) 220 MG tablet Take 440 mg by mouth 2 (two) times daily as needed (for pain/headache.).    Marland Kitchen nitroGLYCERIN (NITROSTAT) 0.4 MG SL tablet Place 1 tablet (0.4 mg total) under the tongue every 5 (five) minutes as needed for chest pain. (Patient not taking: Reported on 11/12/2016) 25 tablet 3  .  ondansetron (ZOFRAN) 8 MG tablet Take 1 tablet (8 mg total) by mouth 2 (two) times daily as needed for refractory nausea / vomiting. Start on day 3 after chemo. (Patient not taking: Reported on 11/12/2016) 30 tablet 1  . oxyCODONE (OXY IR/ROXICODONE) 5 MG immediate release tablet Take 1 tablet (5 mg total) by mouth every 6 (six) hours as needed for severe pain. 30 tablet 0  . prochlorperazine (COMPAZINE) 10 MG tablet Take 1 tablet (10 mg total) by mouth every 6 (six) hours as needed (Nausea or vomiting).  (Patient not taking: Reported on 11/12/2016) 30 tablet 1  . Turmeric 500 MG CAPS Take 500 mg by mouth 2 (two) times daily.    . vitamin B-12 (CYANOCOBALAMIN) 1000 MCG tablet Take 1,000 mcg by mouth daily.     No current facility-administered medications for this encounter.     ECOG PERFORMANCE STATUS:  0 - Asymptomatic  REVIEW OF SYSTEMS:  Patient denies any weight loss, fatigue, weakness, fever, chills or night sweats. Patient denies any loss of vision, blurred vision. Patient denies any ringing  of the ears or hearing loss. No irregular heartbeat. Patient denies heart murmur or history of fainting. Patient denies any chest pain or pain radiating to her upper extremities. Patient denies any shortness of breath, difficulty breathing at night, cough or hemoptysis. Patient denies any swelling in the lower legs. Patient denies any nausea vomiting, vomiting of blood, or coffee ground material in the vomitus. Patient denies any stomach pain. Patient states has had normal bowel movements no significant constipation or diarrhea. Patient denies any dysuria, hematuria or significant nocturia. Patient denies any problems walking, swelling in the joints or loss of balance. Patient denies any skin changes, loss of hair or loss of weight. Patient denies any excessive worrying or anxiety or significant depression. Patient denies any problems with insomnia. Patient denies excessive thirst, polyuria, polydipsia. Patient denies any swollen glands, patient denies easy bruising or easy bleeding. Patient denies any recent infections, allergies or URI. Patient "s visual fields have not changed significantly in recent time.    PHYSICAL EXAM: There were no vitals taken for this visit. A well-developed female in NAD receiving infusional chemotherapy. She has alopecia the scalp. Well-developed well-nourished patient in NAD. HEENT reveals PERLA, EOMI, discs not visualized.  Oral cavity is clear. No oral mucosal lesions are  identified. Neck is clear without evidence of cervical or supraclavicular adenopathy. Lungs are clear to A&P. Cardiac examination is essentially unremarkable with regular rate and rhythm without murmur rub or thrill. Abdomen is benign with no organomegaly or masses noted. Motor sensory and DTR levels are equal and symmetric in the upper and lower extremities. Cranial nerves II through XII are grossly intact. Proprioception is intact. No peripheral adenopathy or edema is identified. No motor or sensory levels are noted. Crude visual fields are within normal range.  LABORATORY DATA: pathology reports reviewed    RADIOLOGY RESULTS:CT scans are reviewed and compatible with the above-stated findings previous CT scan of her chest is also reviewed showing no evidence to suggest recurrent or progressive Hodgkin's disease.   IMPRESSION: high risk and meter carcinoma based on myometrial invasion patient ageand serous histology.in 66 year old female status post TAH/BSO pelvic lymph node sampling and adjuvant chemotherapy  PLAN: at this time I like to give the patient short treatment break and then plan on whole pelvic radiation therapy. Would treat up to 4500 cGy over 5 weeks. Would not use vaginal brachytherapy since this is controversial in this subset  of patients as well as her prior problems with her vaginal cuff and pelvic abscess. Risks and benefits of treatment including diarrhea possible increased lower urinary tract symptoms fatigue alteration of blood counts skin reaction all were described in detail to the patient. She seems to comprehend my treatment plan well. I have scheduled her out for CT simulation in about 2-3 weeks. Patient knows to call prior with any concerns.I first ordered and scheduled CT simulation  I would like to take this opportunity to thank you for allowing me to participate in the care of your patient.Armstead Peaks., MD

## 2016-11-12 NOTE — Progress Notes (Signed)
Pt received Carbo 470mg  previously. SCr has increased to 1.14, and calculated carbo dose is greater than 10% and calculated to be 430mg .   Called MD who stated to proceed with lower 430mg  dose at this time.

## 2016-11-16 ENCOUNTER — Other Ambulatory Visit (INDEPENDENT_AMBULATORY_CARE_PROVIDER_SITE_OTHER): Payer: Self-pay | Admitting: Vascular Surgery

## 2016-11-17 ENCOUNTER — Encounter
Admission: RE | Disposition: A | Discharge status: Home / Self Care | Payer: Self-pay | Source: Ambulatory Visit | Attending: Vascular Surgery

## 2016-11-17 ENCOUNTER — Encounter: Payer: Self-pay | Admitting: *Deleted

## 2016-11-17 ENCOUNTER — Ambulatory Visit
Admission: RE | Admit: 2016-11-17 | Discharge: 2016-11-17 | Disposition: A | Discharge status: Home / Self Care | Payer: PPO | Source: Ambulatory Visit | Attending: Vascular Surgery | Admitting: Vascular Surgery

## 2016-11-17 DIAGNOSIS — Z8571 Personal history of Hodgkin lymphoma: Secondary | ICD-10-CM | POA: Diagnosis not present

## 2016-11-17 DIAGNOSIS — Z888 Allergy status to other drugs, medicaments and biological substances status: Secondary | ICD-10-CM | POA: Insufficient documentation

## 2016-11-17 DIAGNOSIS — E282 Polycystic ovarian syndrome: Secondary | ICD-10-CM | POA: Diagnosis not present

## 2016-11-17 DIAGNOSIS — Z833 Family history of diabetes mellitus: Secondary | ICD-10-CM | POA: Diagnosis not present

## 2016-11-17 DIAGNOSIS — Z820 Family history of epilepsy and other diseases of the nervous system: Secondary | ICD-10-CM | POA: Insufficient documentation

## 2016-11-17 DIAGNOSIS — Z882 Allergy status to sulfonamides status: Secondary | ICD-10-CM | POA: Diagnosis not present

## 2016-11-17 DIAGNOSIS — K219 Gastro-esophageal reflux disease without esophagitis: Secondary | ICD-10-CM | POA: Insufficient documentation

## 2016-11-17 DIAGNOSIS — Z823 Family history of stroke: Secondary | ICD-10-CM | POA: Diagnosis not present

## 2016-11-17 DIAGNOSIS — Z881 Allergy status to other antibiotic agents status: Secondary | ICD-10-CM | POA: Insufficient documentation

## 2016-11-17 DIAGNOSIS — M1990 Unspecified osteoarthritis, unspecified site: Secondary | ICD-10-CM | POA: Diagnosis not present

## 2016-11-17 DIAGNOSIS — Z7982 Long term (current) use of aspirin: Secondary | ICD-10-CM | POA: Diagnosis not present

## 2016-11-17 DIAGNOSIS — I10 Essential (primary) hypertension: Secondary | ICD-10-CM | POA: Insufficient documentation

## 2016-11-17 DIAGNOSIS — Z87891 Personal history of nicotine dependence: Secondary | ICD-10-CM | POA: Insufficient documentation

## 2016-11-17 DIAGNOSIS — C541 Malignant neoplasm of endometrium: Secondary | ICD-10-CM | POA: Diagnosis not present

## 2016-11-17 DIAGNOSIS — Z9109 Other allergy status, other than to drugs and biological substances: Secondary | ICD-10-CM | POA: Diagnosis not present

## 2016-11-17 DIAGNOSIS — Z9889 Other specified postprocedural states: Secondary | ICD-10-CM | POA: Insufficient documentation

## 2016-11-17 DIAGNOSIS — Z90710 Acquired absence of both cervix and uterus: Secondary | ICD-10-CM | POA: Diagnosis not present

## 2016-11-17 DIAGNOSIS — I6529 Occlusion and stenosis of unspecified carotid artery: Secondary | ICD-10-CM | POA: Insufficient documentation

## 2016-11-17 DIAGNOSIS — Z8042 Family history of malignant neoplasm of prostate: Secondary | ICD-10-CM | POA: Diagnosis not present

## 2016-11-17 DIAGNOSIS — Z955 Presence of coronary angioplasty implant and graft: Secondary | ICD-10-CM | POA: Diagnosis not present

## 2016-11-17 DIAGNOSIS — Z90722 Acquired absence of ovaries, bilateral: Secondary | ICD-10-CM | POA: Diagnosis not present

## 2016-11-17 DIAGNOSIS — Z9049 Acquired absence of other specified parts of digestive tract: Secondary | ICD-10-CM | POA: Diagnosis not present

## 2016-11-17 DIAGNOSIS — Z803 Family history of malignant neoplasm of breast: Secondary | ICD-10-CM | POA: Insufficient documentation

## 2016-11-17 HISTORY — PX: PORTA CATH REMOVAL: CATH118286

## 2016-11-17 SURGERY — PORTA CATH REMOVAL
Anesthesia: Moderate Sedation

## 2016-11-17 MED ORDER — HEPARIN (PORCINE) IN NACL 2-0.9 UNIT/ML-% IJ SOLN
INTRAMUSCULAR | Status: AC
  Filled 2016-11-17: qty 1000

## 2016-11-17 MED ORDER — ONDANSETRON HCL 4 MG/2ML IJ SOLN: 4.0000 mg | Freq: Four times a day (QID) | INTRAMUSCULAR | Status: DC | PRN

## 2016-11-17 MED ORDER — HYDROMORPHONE HCL 1 MG/ML IJ SOLN: 1.0000 mg | Freq: Once | INTRAMUSCULAR | Status: DC | PRN

## 2016-11-17 MED ORDER — FENTANYL CITRATE (PF) 100 MCG/2ML IJ SOLN
INTRAMUSCULAR | Status: AC
  Filled 2016-11-17: qty 2

## 2016-11-17 MED ORDER — MIDAZOLAM HCL 2 MG/2ML IJ SOLN
INTRAMUSCULAR | Status: DC | PRN
  Administered 2016-11-17: 2 mg via INTRAVENOUS
  Administered 2016-11-17: 1 mg via INTRAVENOUS
  Administered 2016-11-17: 2 mg via INTRAVENOUS

## 2016-11-17 MED ORDER — CEFAZOLIN SODIUM-DEXTROSE 2-4 GM/100ML-% IV SOLN
2.0000 g | Freq: Once | INTRAVENOUS | Status: AC
  Administered 2016-11-17: 2 g via INTRAVENOUS

## 2016-11-17 MED ORDER — FENTANYL CITRATE (PF) 100 MCG/2ML IJ SOLN
INTRAMUSCULAR | Status: DC | PRN
  Administered 2016-11-17 (×3): 50 ug via INTRAVENOUS

## 2016-11-17 MED ORDER — MIDAZOLAM HCL 2 MG/2ML IJ SOLN
INTRAMUSCULAR | Status: AC
  Filled 2016-11-17: qty 2

## 2016-11-17 MED ORDER — LIDOCAINE-EPINEPHRINE (PF) 2 %-1:200000 IJ SOLN
INTRAMUSCULAR | Status: AC
  Filled 2016-11-17: qty 20

## 2016-11-17 MED ORDER — CEFAZOLIN SODIUM-DEXTROSE 1-4 GM/50ML-% IV SOLN: 1.0000 g | Freq: Once | INTRAVENOUS | Status: DC

## 2016-11-17 MED ORDER — SODIUM CHLORIDE 0.9 % IV SOLN
INTRAVENOUS | Status: DC
  Administered 2016-11-17: 12:00:00 via INTRAVENOUS

## 2016-11-17 MED ORDER — SODIUM CHLORIDE 0.9 % IV SOLN
INTRAVENOUS | Status: AC | PRN
  Administered 2016-11-17: 250 mL via INTRAVENOUS

## 2016-11-17 SURGICAL SUPPLY — 4 items
DERMABOND ADVANCED (GAUZE/BANDAGES/DRESSINGS) ×1
DERMABOND ADVANCED .7 DNX12 (GAUZE/BANDAGES/DRESSINGS) ×1 IMPLANT
PACK ANGIOGRAPHY (CUSTOM PROCEDURE TRAY) ×2 IMPLANT
SUT MNCRL AB 4-0 PS2 18 (SUTURE) ×2 IMPLANT

## 2016-11-17 NOTE — H&P (Signed)
[facility] History & Physical Update  The patient was interviewed and re-examined.  The patient's previous History and Physical has been reviewed and is unchanged.  There is no change in the plan of care. We plan to proceed with the scheduled procedure.  Hortencia Pilar, MD  11/17/2016, 2:15 PM

## 2016-11-17 NOTE — Op Note (Addendum)
  OPERATIVE NOTE   PROCEDURE: Removal of Left IJ  Infuse-a-Port  PRE-OPERATIVE DIAGNOSIS: Endometrial carcinoma   POST-OPERATIVE DIAGNOSIS: Same  SURGEON: Hortencia Pilar, M.D. ASSISTANT(S): None  ANESTHESIA: Conscious sedation was administered under my direct supervision by the interventional radiology RN. IV Versed plus fentanyl were utilized. Continuous ECG, pulse oximetry and blood pressure was monitored throughout the entire procedure.  Conscious sedation was for a total of 23 minutes.   ESTIMATED BLOOD LOSS: Minimal  FINDING(S): 1.  Port in proper position   SPECIMEN(S):  Port intact  INDICATIONS:   Jaime Hall is a 66 y.o. y.o. female who presents with history of endometrial carcinoma she is now completed her chemotherapy and therefore no longer requires her port. Infuse-a-Port is being removed. The risks and benefits were reviewed all questions answered patient has agreed to proceed.   DESCRIPTION: After obtaining full informed written consent, the patient is brought to special procedures and positioned supine.  The patient received IV antibiotics.  The patient was prepped and draped in the standard fashion appropriate time out is called.    After infiltrating 1% lidocaine with epinephrine into the soft tissues and skin surrounding the port the previous incisional scar is reopened with an 11 blade scalpel.  The port is slipped from the pocket and subsequently removed without difficulty otherwise intact.  Pressure is held at the base of the neck for 5 minutes, a pocket is irrigated. Subtotally the wound is packed with saline moistened gauze and sterile dressings applied.  The patient tolerated the procedure without changes. COMPLICATIONS: None  CONDITION: Good  Hortencia Pilar, M.D. [facility] Office: [phone]   11/17/2016, 2:45 PM

## 2016-11-18 ENCOUNTER — Encounter: Payer: Self-pay | Admitting: Vascular Surgery

## 2016-11-23 ENCOUNTER — Encounter (INDEPENDENT_AMBULATORY_CARE_PROVIDER_SITE_OTHER): Payer: Self-pay | Admitting: Vascular Surgery

## 2016-11-23 ENCOUNTER — Ambulatory Visit (INDEPENDENT_AMBULATORY_CARE_PROVIDER_SITE_OTHER): Payer: PPO | Admitting: Vascular Surgery

## 2016-11-23 VITALS — BP 118/65 | HR 80 | Resp 16 | Wt 178.0 lb

## 2016-11-23 DIAGNOSIS — I890 Lymphedema, not elsewhere classified: Secondary | ICD-10-CM

## 2016-11-23 DIAGNOSIS — C541 Malignant neoplasm of endometrium: Secondary | ICD-10-CM | POA: Diagnosis not present

## 2016-11-23 NOTE — Progress Notes (Signed)
Subjective:    Patient ID: Jaime Hall, female    DOB: 1950/09/03, 66 y.o.   MRN: [id number] Chief Complaint  Patient presents with  . Follow-up   Patient presents for her first post procedure follow-up. The patient is status post a Port-A-Cath removal on 11/17/2016. The patient's postprocedure course has been uneventful. She states her Port-A-Cath site is healing well. She denies any fever nausea vomiting. She has finished chemotherapy and now will start radiation. The patient also informs that she received her lymphedema pump about 1 month ago. The patient states an improvement in symptoms including her bilateral lower extremity edema and feet pain. The patient continues to wear medical grade one compression, engage in daily elevation of her lower extremity and remains active.    Review of Systems  Constitutional: Negative.   HENT: Negative.   Eyes: Negative.   Respiratory: Negative.   Cardiovascular: Negative.   Gastrointestinal: Negative.   Endocrine: Negative.   Genitourinary: Negative.   Musculoskeletal: Negative.   Skin: Negative.   Allergic/Immunologic: Negative.   Neurological: Negative.   Hematological: Negative.   Psychiatric/Behavioral: Negative.        Objective:   Physical Exam  Constitutional: She is oriented to person, place, and time. She appears well-developed and well-nourished. No distress.  HENT:  Head: Atraumatic.  Eyes: Conjunctivae are normal. Pupils are equal, round, and reactive to light.  Neck: Normal range of motion.  Cardiovascular: Normal rate, regular rhythm, normal heart sounds and intact distal pulses.   Pulses:      Radial pulses are 2+ on the right side, and 2+ on the left side.       Dorsalis pedis pulses are 2+ on the right side, and 2+ on the left side.       Posterior tibial pulses are 2+ on the right side, and 2+ on the left side.  Pulmonary/Chest: Effort normal.  Musculoskeletal: Normal range of motion. She exhibits no edema.    Neurological: She is alert and oriented to person, place, and time.  Skin: Skin is warm and dry. She is not diaphoretic.     Port-A-Cath site healing  Psychiatric: She has a normal mood and affect. Her behavior is normal. Judgment and thought content normal.  Vitals reviewed.   BP 118/65   Pulse 80   Resp 16   Wt 178 lb (80.7 kg)   BMI 32.56 kg/m   Past Medical History:  Diagnosis Date  . Anxiety   . Cervicalgia   . Coronary artery disease    a. 02/2012 Stress echo: severe anterior wall ischemia;  b. 02/2012 Cath/PCI: LAD 95p (3.0 x 15 Xience EX DES), D1 90ost (PTCA - bifurcational dzs), EF 45% with anterior HK;  b. 02/2013 Ex MV: fixed anterior defect w/ minor reversibility, nl EF-->Med Rx.  . Endometrial cancer ([place])    a. 07/2016 s/p robotic hysterectomy, BSO w/ washings, sentinel node inj, mapping, bx, adhesiolysis.  . Essential hypertension, benign   . Fibrocystic breast disease   . GERD (gastroesophageal reflux disease)   . Gestational hypertension   . Heart murmur   . History of anemia   . History of blood transfusion   . Hodgkin's lymphoma ([place]) 2011   a. s/p radiation and chemo therapy  . Osteoarthritis   . Polycystic ovarian disease     Social History   Social History  . Marital status: Widowed    Spouse name: N/A  . Number of children: N/A  . Years of  education: N/A   Occupational History  . Not on file.   Social History Main Topics  . Smoking status: Former Smoker    Packs/day: 1.00    Years: 30.00    Types: Cigarettes    Quit date: 07/24/2002  . Smokeless tobacco: Never Used     Comment: quit smoking in 2000  . Alcohol use Yes     Comment: occasional, 1-2 drinks per month  . Drug use: No  . Sexual activity: No   Other Topics Concern  . Not on file   Social History Narrative  . No narrative on file    Past Surgical History:  Procedure Laterality Date  . ABDOMINAL HYSTERECTOMY    . bladder sling    . CARDIAC CATHETERIZATION  02/2012    [facility] 1 stent place  . CERVICAL POLYPECTOMY    . CHOLECYSTECTOMY  1982  . COLONOSCOPY WITH PROPOFOL N/A 02/05/2015   Procedure: COLONOSCOPY WITH PROPOFOL;  Surgeon: Lucilla Lame, MD;  Location: [facility];  Service: Endoscopy;  Laterality: N/A;  . CORONARY ANGIOPLASTY  02/2012   left/right s/p balloon  . CYSTOGRAM N/A 08/17/2016   Procedure: CYSTOGRAM;  Surgeon: Hollice Espy, MD;  Location: [facility];  Service: Urology;  Laterality: N/A;  . CYSTOSCOPY N/A 08/17/2016   Procedure: CYSTOSCOPY EXAM UNDER ANESTHESIA;  Surgeon: Hollice Espy, MD;  Location: [facility];  Service: Urology;  Laterality: N/A;  . CYSTOSCOPY W/ RETROGRADES Bilateral 08/17/2016   Procedure: CYSTOSCOPY WITH RETROGRADE PYELOGRAM;  Surgeon: Hollice Espy, MD;  Location: [facility];  Service: Urology;  Laterality: Bilateral;  . CYSTOSCOPY WITH STENT PLACEMENT Right 08/17/2016   Procedure: CYSTOSCOPY WITH STENT PLACEMENT;  Surgeon: Hollice Espy, MD;  Location: [facility];  Service: Urology;  Laterality: Right;  . heart stent'  2013  . kidney stent Right 2018  . LYMPH NODE BIOPSY  2011   diagnosis of hodgkins lymphoma  . PELVIC LYMPH NODE DISSECTION N/A 07/29/2016   Procedure: PELVIC/AORTIC LYMPH NODE SAMPLING;  Surgeon: [name], MD;  Location: [facility];  Service: Gynecology;  Laterality: N/A;  . PORTA CATH INSERTION N/A 09/22/2016   Procedure: Glori Luis Cath Insertion;  Surgeon: [name], MD;  Location: [place] [facility];  Service: Cardiovascular;  Laterality: N/A;  . PORTA CATH REMOVAL N/A 11/17/2016   Procedure: Glori Luis Cath Removal;  Surgeon: [name], MD;  Location: [place] [facility];  Service: Cardiovascular;  Laterality: N/A;  . ROBOTIC ASSISTED TOTAL HYSTERECTOMY WITH BILATERAL SALPINGO OOPHERECTOMY N/A 07/29/2016   Procedure: ROBOTIC ASSISTED TOTAL HYSTERECTOMY WITH BILATERAL SALPINGO OOPHORECTOMY;  Surgeon: [name], MD;  Location: [facility];  Service: Gynecology;  Laterality: N/A;   . SENTINEL NODE BIOPSY N/A 07/29/2016   Procedure: SENTINEL NODE BIOPSY;  Surgeon: [name], MD;  Location: [facility];  Service: Gynecology;  Laterality: N/A;  . transobturator sling N/A 2009   [place]    Family History  Problem Relation Age of Onset  . ALS Father   . Polymyositis Father   . Diabetes Brother   . Cancer Maternal Aunt        breast  . Breast cancer Maternal Aunt        30's  . Stroke Maternal Grandmother   . Cancer Maternal Grandfather        prostate  . Stroke Maternal Grandfather     Allergies  Allergen Reactions  . Adhesive [Tape] Other (See Comments)    Burns the skin  . Z-Pak [Azithromycin] Itching  .  Antifungal [Miconazole Nitrate] Rash  . Sulfa Antibiotics Nausea And Vomiting and Rash    N/T       Assessment & Plan:  Patient presents for her first post procedure follow-up. The patient is status post a Port-A-Cath removal on 11/17/2016. The patient's postprocedure course has been uneventful. She states her Port-A-Cath site is healing well. She denies any fever nausea vomiting. She has finished chemotherapy and now will start radiation. The patient also informs that she received her lymphedema pump about 1 month ago. The patient states an improvement in symptoms including her bilateral lower extremity edema and feet pain. The patient continues to wear medical grade one compression, engage in daily elevation of her lower extremity and remains active.  1. Lymphedema - Improvement Patient with improvement to her bilateral lower extremity edema now that she has received a lymphedema pump. The patient will continue to wear medical grade one compression stockings, elevate her bilateral lower extremity and remain active. Patient to follow up when necessary.  2. Endometrial cancer ([place]) - stable Patient has finished chemotherapy. Patient has had her Port-A-Cath removed on 11/17/2016.  Her post procedure course has been uneventful Her Port-A-Cath  site is healing well  Current Outpatient Prescriptions on File Prior to Visit  Medication Sig Dispense Refill  . acetaminophen (TYLENOL) 500 MG tablet Take 1,000 mg by mouth every 6 (six) hours as needed for mild pain.     Marland Kitchen aspirin EC 81 MG tablet Take 81 mg by mouth at bedtime.    Marland Kitchen atorvastatin (LIPITOR) 10 MG tablet TAKE ONE TABLET BY MOUTH ONCE DAILY (Patient taking differently: Take 10 mg by mouth at bedtime. ) 90 tablet 3  . Cholecalciferol (VITAMIN D3) 2000 units TABS Take 2,000 Units by mouth 3 (three) times a week.    . diazepam (VALIUM) 5 MG tablet Take 1 tablet (5 mg total) by mouth every 12 (twelve) hours as needed for anxiety. (Patient taking differently: Take 2.5 mg by mouth every 12 (twelve) hours as needed for anxiety. Anxiety) 30 tablet 0  . esomeprazole (NEXIUM) 20 MG capsule Take 20 mg by mouth daily before breakfast.     . ibuprofen (ADVIL,MOTRIN) 200 MG tablet Take 200 mg by mouth every 6 (six) hours as needed for mild pain.     Marland Kitchen lidocaine-prilocaine (EMLA) cream Apply to affected area once 30 g 3  . metoprolol tartrate (LOPRESSOR) 25 MG tablet TAKE ONE TABLET BY MOUTH TWICE DAILY 180 tablet 2  . naproxen sodium (ANAPROX) 220 MG tablet Take 440 mg by mouth 2 (two) times daily as needed (for pain/headache.).    Marland Kitchen nitroGLYCERIN (NITROSTAT) 0.4 MG SL tablet Place 1 tablet (0.4 mg total) under the tongue every 5 (five) minutes as needed for chest pain. 25 tablet 3  . oxyCODONE (OXY IR/ROXICODONE) 5 MG immediate release tablet Take 1 tablet (5 mg total) by mouth every 6 (six) hours as needed for severe pain. 30 tablet 0  . Turmeric 500 MG CAPS Take 500 mg by mouth 2 (two) times daily.    Marland Kitchen dexamethasone (DECADRON) 4 MG tablet Take 2 tablets (8 mg total) by mouth daily. Start the day after chemotherapy for 2 days. (Patient not taking: Reported on 11/23/2016) 30 tablet 1  . gabapentin (NEURONTIN) 300 MG capsule Take 1 capsule (300 mg total) by mouth 3 (three) times daily. (Patient  not taking: Reported on 11/12/2016) 90 capsule 2  . LORazepam (ATIVAN) 0.5 MG tablet Take 1 tablet (0.5 mg total) by mouth  every 6 (six) hours as needed (Nausea or vomiting). (Patient not taking: Reported on 11/23/2016) 30 tablet 0  . ondansetron (ZOFRAN) 8 MG tablet Take 1 tablet (8 mg total) by mouth 2 (two) times daily as needed for refractory nausea / vomiting. Start on day 3 after chemo. (Patient not taking: Reported on 11/23/2016) 30 tablet 1  . prochlorperazine (COMPAZINE) 10 MG tablet Take 1 tablet (10 mg total) by mouth every 6 (six) hours as needed (Nausea or vomiting). (Patient not taking: Reported on 11/23/2016) 30 tablet 1   No current facility-administered medications on file prior to visit.     There are no Patient Instructions on file for this visit. No Follow-up on file.   Susannah Carbin A Emersynn Deatley, PA-C

## 2016-11-24 ENCOUNTER — Ambulatory Visit
Admission: RE | Admit: 2016-11-24 | Discharge: 2016-11-24 | Disposition: A | Discharge status: Home / Self Care | Payer: PPO | Source: Ambulatory Visit | Attending: Radiation Oncology | Admitting: Radiation Oncology

## 2016-11-24 DIAGNOSIS — F419 Anxiety disorder, unspecified: Secondary | ICD-10-CM | POA: Diagnosis not present

## 2016-11-24 DIAGNOSIS — Z803 Family history of malignant neoplasm of breast: Secondary | ICD-10-CM | POA: Diagnosis not present

## 2016-11-24 DIAGNOSIS — Z510 Encounter for antineoplastic radiation therapy: Secondary | ICD-10-CM | POA: Diagnosis not present

## 2016-11-24 DIAGNOSIS — Z90722 Acquired absence of ovaries, bilateral: Secondary | ICD-10-CM | POA: Insufficient documentation

## 2016-11-24 DIAGNOSIS — R011 Cardiac murmur, unspecified: Secondary | ICD-10-CM | POA: Diagnosis not present

## 2016-11-24 DIAGNOSIS — N6019 Diffuse cystic mastopathy of unspecified breast: Secondary | ICD-10-CM | POA: Insufficient documentation

## 2016-11-24 DIAGNOSIS — C55 Malignant neoplasm of uterus, part unspecified: Secondary | ICD-10-CM | POA: Insufficient documentation

## 2016-11-24 DIAGNOSIS — I2510 Atherosclerotic heart disease of native coronary artery without angina pectoris: Secondary | ICD-10-CM | POA: Diagnosis not present

## 2016-11-24 DIAGNOSIS — Z87891 Personal history of nicotine dependence: Secondary | ICD-10-CM | POA: Insufficient documentation

## 2016-11-24 DIAGNOSIS — Z8572 Personal history of non-Hodgkin lymphomas: Secondary | ICD-10-CM | POA: Diagnosis not present

## 2016-11-24 DIAGNOSIS — Z923 Personal history of irradiation: Secondary | ICD-10-CM | POA: Diagnosis not present

## 2016-11-24 DIAGNOSIS — Z171 Estrogen receptor negative status [ER-]: Secondary | ICD-10-CM | POA: Diagnosis not present

## 2016-11-24 DIAGNOSIS — Z90710 Acquired absence of both cervix and uterus: Secondary | ICD-10-CM | POA: Diagnosis not present

## 2016-11-24 DIAGNOSIS — Z7982 Long term (current) use of aspirin: Secondary | ICD-10-CM | POA: Diagnosis not present

## 2016-11-24 DIAGNOSIS — M542 Cervicalgia: Secondary | ICD-10-CM | POA: Diagnosis not present

## 2016-11-24 DIAGNOSIS — Z79899 Other long term (current) drug therapy: Secondary | ICD-10-CM | POA: Diagnosis not present

## 2016-11-24 DIAGNOSIS — Z809 Family history of malignant neoplasm, unspecified: Secondary | ICD-10-CM | POA: Diagnosis not present

## 2016-11-24 DIAGNOSIS — I10 Essential (primary) hypertension: Secondary | ICD-10-CM | POA: Insufficient documentation

## 2016-11-24 DIAGNOSIS — K219 Gastro-esophageal reflux disease without esophagitis: Secondary | ICD-10-CM | POA: Insufficient documentation

## 2016-11-24 DIAGNOSIS — C541 Malignant neoplasm of endometrium: Secondary | ICD-10-CM | POA: Diagnosis not present

## 2016-11-24 DIAGNOSIS — M1990 Unspecified osteoarthritis, unspecified site: Secondary | ICD-10-CM | POA: Diagnosis not present

## 2016-12-01 ENCOUNTER — Encounter: Payer: Self-pay | Admitting: Pathology

## 2016-12-02 ENCOUNTER — Ambulatory Visit (INDEPENDENT_AMBULATORY_CARE_PROVIDER_SITE_OTHER): Payer: PPO | Admitting: Vascular Surgery

## 2016-12-06 DIAGNOSIS — C541 Malignant neoplasm of endometrium: Secondary | ICD-10-CM | POA: Diagnosis not present

## 2016-12-06 DIAGNOSIS — Z510 Encounter for antineoplastic radiation therapy: Secondary | ICD-10-CM | POA: Diagnosis not present

## 2016-12-07 DIAGNOSIS — Z79899 Other long term (current) drug therapy: Secondary | ICD-10-CM | POA: Diagnosis not present

## 2016-12-07 DIAGNOSIS — K219 Gastro-esophageal reflux disease without esophagitis: Secondary | ICD-10-CM | POA: Diagnosis not present

## 2016-12-07 DIAGNOSIS — Z171 Estrogen receptor negative status [ER-]: Secondary | ICD-10-CM | POA: Diagnosis not present

## 2016-12-07 DIAGNOSIS — Z510 Encounter for antineoplastic radiation therapy: Secondary | ICD-10-CM | POA: Diagnosis not present

## 2016-12-07 DIAGNOSIS — I10 Essential (primary) hypertension: Secondary | ICD-10-CM | POA: Diagnosis not present

## 2016-12-07 DIAGNOSIS — Z923 Personal history of irradiation: Secondary | ICD-10-CM | POA: Diagnosis not present

## 2016-12-07 DIAGNOSIS — Z809 Family history of malignant neoplasm, unspecified: Secondary | ICD-10-CM | POA: Diagnosis not present

## 2016-12-07 DIAGNOSIS — M542 Cervicalgia: Secondary | ICD-10-CM | POA: Diagnosis not present

## 2016-12-07 DIAGNOSIS — N6019 Diffuse cystic mastopathy of unspecified breast: Secondary | ICD-10-CM | POA: Diagnosis not present

## 2016-12-07 DIAGNOSIS — Z90710 Acquired absence of both cervix and uterus: Secondary | ICD-10-CM | POA: Diagnosis not present

## 2016-12-07 DIAGNOSIS — Z87891 Personal history of nicotine dependence: Secondary | ICD-10-CM | POA: Diagnosis not present

## 2016-12-07 DIAGNOSIS — M1990 Unspecified osteoarthritis, unspecified site: Secondary | ICD-10-CM | POA: Diagnosis not present

## 2016-12-07 DIAGNOSIS — Z8572 Personal history of non-Hodgkin lymphomas: Secondary | ICD-10-CM | POA: Diagnosis not present

## 2016-12-07 DIAGNOSIS — C55 Malignant neoplasm of uterus, part unspecified: Secondary | ICD-10-CM | POA: Diagnosis not present

## 2016-12-07 DIAGNOSIS — Z90722 Acquired absence of ovaries, bilateral: Secondary | ICD-10-CM | POA: Diagnosis not present

## 2016-12-07 DIAGNOSIS — I2510 Atherosclerotic heart disease of native coronary artery without angina pectoris: Secondary | ICD-10-CM | POA: Diagnosis not present

## 2016-12-07 DIAGNOSIS — Z803 Family history of malignant neoplasm of breast: Secondary | ICD-10-CM | POA: Diagnosis not present

## 2016-12-07 DIAGNOSIS — R011 Cardiac murmur, unspecified: Secondary | ICD-10-CM | POA: Diagnosis not present

## 2016-12-07 DIAGNOSIS — Z7982 Long term (current) use of aspirin: Secondary | ICD-10-CM | POA: Diagnosis not present

## 2016-12-07 DIAGNOSIS — F419 Anxiety disorder, unspecified: Secondary | ICD-10-CM | POA: Diagnosis not present

## 2016-12-10 ENCOUNTER — Inpatient Hospital Stay (HOSPITAL_BASED_OUTPATIENT_CLINIC_OR_DEPARTMENT_OTHER): Payer: PPO | Admitting: Oncology

## 2016-12-10 ENCOUNTER — Inpatient Hospital Stay: Payer: PPO | Attending: Oncology

## 2016-12-10 ENCOUNTER — Encounter: Payer: Self-pay | Admitting: Oncology

## 2016-12-10 VITALS — BP 108/64 | HR 71 | Temp 97.5°F | Resp 18 | Wt 173.2 lb

## 2016-12-10 DIAGNOSIS — Z79899 Other long term (current) drug therapy: Secondary | ICD-10-CM | POA: Insufficient documentation

## 2016-12-10 DIAGNOSIS — M542 Cervicalgia: Secondary | ICD-10-CM | POA: Insufficient documentation

## 2016-12-10 DIAGNOSIS — Z9221 Personal history of antineoplastic chemotherapy: Secondary | ICD-10-CM | POA: Insufficient documentation

## 2016-12-10 DIAGNOSIS — F419 Anxiety disorder, unspecified: Secondary | ICD-10-CM | POA: Diagnosis not present

## 2016-12-10 DIAGNOSIS — M1990 Unspecified osteoarthritis, unspecified site: Secondary | ICD-10-CM | POA: Insufficient documentation

## 2016-12-10 DIAGNOSIS — N736 Female pelvic peritoneal adhesions (postinfective): Secondary | ICD-10-CM

## 2016-12-10 DIAGNOSIS — I10 Essential (primary) hypertension: Secondary | ICD-10-CM

## 2016-12-10 DIAGNOSIS — C541 Malignant neoplasm of endometrium: Secondary | ICD-10-CM

## 2016-12-10 DIAGNOSIS — Z7982 Long term (current) use of aspirin: Secondary | ICD-10-CM | POA: Diagnosis not present

## 2016-12-10 DIAGNOSIS — Z90710 Acquired absence of both cervix and uterus: Secondary | ICD-10-CM

## 2016-12-10 DIAGNOSIS — K219 Gastro-esophageal reflux disease without esophagitis: Secondary | ICD-10-CM | POA: Diagnosis not present

## 2016-12-10 DIAGNOSIS — Z90722 Acquired absence of ovaries, bilateral: Secondary | ICD-10-CM | POA: Insufficient documentation

## 2016-12-10 DIAGNOSIS — I2510 Atherosclerotic heart disease of native coronary artery without angina pectoris: Secondary | ICD-10-CM | POA: Insufficient documentation

## 2016-12-10 DIAGNOSIS — G620 Drug-induced polyneuropathy: Secondary | ICD-10-CM | POA: Diagnosis not present

## 2016-12-10 DIAGNOSIS — Z8571 Personal history of Hodgkin lymphoma: Secondary | ICD-10-CM

## 2016-12-10 DIAGNOSIS — T451X5A Adverse effect of antineoplastic and immunosuppressive drugs, initial encounter: Secondary | ICD-10-CM

## 2016-12-10 DIAGNOSIS — Z5111 Encounter for antineoplastic chemotherapy: Secondary | ICD-10-CM

## 2016-12-10 LAB — COMPREHENSIVE METABOLIC PANEL
ALT: 18 U/L (ref 14–54)
AST: 24 U/L (ref 15–41)
Albumin: 4.1 g/dL (ref 3.5–5.0)
Alkaline Phosphatase: 82 U/L (ref 38–126)
Anion gap: 6 (ref 5–15)
BUN: 16 mg/dL (ref 6–20)
CO2: 29 mmol/L (ref 22–32)
Calcium: 9.3 mg/dL (ref 8.9–10.3)
Chloride: 105 mmol/L (ref 101–111)
Creatinine, Ser: 1.02 mg/dL — ABNORMAL HIGH (ref 0.44–1.00)
GFR calc Af Amer: >60 mL/min (ref >60)
GFR calc non Af Amer: 56 mL/min — ABNORMAL LOW (ref >60)
Glucose, Bld: 102 mg/dL — ABNORMAL HIGH (ref 65–99)
Potassium: 4.1 mmol/L (ref 3.5–5.1)
Sodium: 140 mmol/L (ref 135–145)
Total Bilirubin: 1.1 mg/dL (ref 0.3–1.2)
Total Protein: 6.8 g/dL (ref 6.5–8.1)

## 2016-12-10 LAB — CBC WITH DIFFERENTIAL/PLATELET
Basophils Absolute: 0 10*3/uL (ref 0–0.1)
Basophils Relative: 1 %
Eosinophils Absolute: 0.1 10*3/uL (ref 0–0.7)
Eosinophils Relative: 2 %
HCT: 33.9 % — ABNORMAL LOW (ref 35.0–47.0)
Hemoglobin: 11.8 g/dL — ABNORMAL LOW (ref 12.0–16.0)
Lymphocytes Relative: 18 %
Lymphs Abs: 1.1 10*3/uL (ref 1.0–3.6)
MCH: 32.2 pg (ref 26.0–34.0)
MCHC: 34.9 g/dL (ref 32.0–36.0)
MCV: 92.2 fL (ref 80.0–100.0)
Monocytes Absolute: 0.4 10*3/uL (ref 0.2–0.9)
Monocytes Relative: 6 %
Neutro Abs: 4.5 10*3/uL (ref 1.4–6.5)
Neutrophils Relative %: 73 %
Platelets: 323 10*3/uL (ref 150–440)
RBC: 3.68 MIL/uL — ABNORMAL LOW (ref 3.80–5.20)
RDW: 18.7 % — ABNORMAL HIGH (ref 11.5–14.5)
WBC: 6.2 10*3/uL (ref 3.6–11.0)

## 2016-12-10 MED ORDER — DULOXETINE HCL 30 MG PO CPEP: 30.0000 mg | ORAL_CAPSULE | Freq: Every day | ORAL | 0 refills | Status: DC

## 2016-12-10 MED ORDER — DULOXETINE HCL 60 MG PO CPEP: 60.0000 mg | ORAL_CAPSULE | Freq: Every day | ORAL | 3 refills | Status: DC

## 2016-12-10 NOTE — Progress Notes (Signed)
Patient states she lost her husband last year.  Since that time she sleeps only about 4 hours a night.  She states she is having problems with neuropathy in her feet, feels like she is walking on blisters. She cannot tolerate gabapentin.  States she thinks she is going to lose her fingernails.  States they are sore.

## 2016-12-10 NOTE — Progress Notes (Signed)
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 [name] grade 1 endometrial cancer when she presented with postmenopausal bleeding in February 2018. She underwent robotic hysterectomy and bilateral salpingo-oophorectomy with washings and sentinel lymph node injection and mapping and biopsy on 07/29/2016. She was found to have extensive pelvic adhesive disease and underwent elevated pelvic adhesiolysis including enteric and ovariolysis. The cul de sac was obliterated and adherent to posterior cervix.-was negative.  2. Pathology showed: Pathology DIAGNOSIS:  A. SENTINEL LYMPH NODE, RIGHT MID OBTURATOR; EXCISION:  - NO TUMOR SEEN IN ONE LYMPH NODE (0/1).   B. SENTINEL LYMPH NODE, RIGHT EXTERNAL ILIAC; EXCISION:  - NO TUMOR SEEN IN ONE LYMPH NODE (0/1).   C. SENTINEL LYMPH NODE, LEFT EXTERNAL ILIAC; EXCISION:  - NO TUMOR SEEN IN ONE LYMPH NODE (0/1).   D. UTERUS WITH CERVIX, BILATERAL FALLOPIAN TUBES AND OVARIES;  HYSTERECTOMY AND BILATERAL SALPINGO-OOPHORECTOMY:  - SEROUS CARCINOMA, 4.5 CM(SEE NOTE).  - LYMPHOVASCULAR INVASION PRESENT.  - NO TUMOR SEEN IN BILATERAL OVARIES AND FALLOPIAN TUBES.    ENDOMETRIUM:  Procedure: Hysterectomy with bilateral salpingo-oophorectomy  Histologic Type: Serous carcinoma  Myometrial Invasion: Present  Depth of invasion (millimeters): 13 mm  Myometrial thickness (millimeters): 14 mm    Percentage of myometrial invasion: 93%  Uterine Serosa Involvement: Not identified  Cervical Stromal Involvement: Not identified   BIOMARKER REPORTING TEMPLATE:  p53 Expression: Abnormal strong diffuse overexpression (>90%)  ER/PR negative. Her2 pending MS stable  3. She presented with fever on 08/05/2016 and CT scan showed 1.8 x 3.8 cm complex fluid collection containing pockets of air within somewhat organized walls within the pelvis and the hysterectomy bed most consistent with a developing abscess. There is a superior extension of the loculated fluid along the left lateral pelvic wall anterior to the left psoas muscle measuring 2 x 4 cm and another fluid collection along the anterior surface of the left psoas muscle measuring 1.8 x 3.6 cm. Blood cultures were positive for bacteria growing his fragilis. Urine cultures were negative. Patient was treated with IV antibiotics and discharged on Augmentin.  4. 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 [name] from urology on 08/14/2016. No leak was seen on CT urogram.  5. On 08/17/2016 patient underwentExam under anesthesia, vaginoscopy, cystoscopy, cystogram with interpretation of fluoroscopy less than 30 minutes, bilateral retrograde pyelogram, and right ureteral stent placement. Intraoperative findings: No obvious ureterovaginal vesicovaginal fistula appreciated. Mild right hydroureteronephrosis down to level of the distal ureter with slight medial deviation without filling defects. Slight mucopurulent drainage from left apical vaginal cuff, specimen cultured. Culture revealed bateroides fragilis and she was started on a 2 week course of Flagyl iniated on 08/24/2016 which she has now completed  6. Repeat CT abdomen on 09/10/2016 IMPRESSION: 1. Interval resolution of abscess involving the vaginal  cuff. 2. The proximal end of the right ureteral stent is coiled in the  proximal right ureter. No hydronephrosis. 3. No evidence of metastatic disease or other significant changes. 4. Aortic atherosclerosis   7. Patient has been recovering slowly following her complicated postoperative course. Currently she reports no fevers or vaginal leakage. Does report some fatigue but denies other complaints. She lives alone and is independent of her ADLs and IADLs. She has a prior history of Hodgkin's lymphoma in 2011 treated with anthracycline-based chemotherapy  8. There was concern about port infection and was looked at by vascular surgery. They did not think it was infected and recommend conservative management. Patient completed 3 cycles of carbo/taxol chemotherapy and will start WPRT next week   Interval history- reports pain in her fingertips. Soles of her feet feel sore and feels like she is walking on blisters. No numbness  ECOG PS- 1 Pain scale- 0   Review of systems- Review of Systems  Constitutional: Negative for chills, fever, malaise/fatigue and weight loss.  HENT: Negative for congestion, ear discharge and nosebleeds.   Eyes: Negative for blurred vision.  Respiratory: Negative for cough, hemoptysis, sputum production, shortness of breath and wheezing.   Cardiovascular: Negative for chest pain, palpitations, orthopnea and claudication.  Gastrointestinal: Negative for abdominal pain, blood in stool, constipation, diarrhea, heartburn, melena, nausea and vomiting.  Genitourinary: Negative for dysuria, flank pain, frequency, hematuria and urgency.  Musculoskeletal: Negative for back pain, joint pain and myalgias.  Skin: Negative for rash.  Neurological: Positive for tingling and sensory change. Negative for dizziness, focal weakness, seizures, weakness and headaches.  Endo/Heme/Allergies: Does not bruise/bleed easily.  Psychiatric/Behavioral: Negative for depression and suicidal  ideas. The patient does not have insomnia.        Allergies  Allergen Reactions  . Adhesive [Tape] Other (See Comments)    Burns the skin  . Z-Pak [Azithromycin] Itching  . Antifungal [Miconazole Nitrate] Rash  . Sulfa Antibiotics Nausea And Vomiting and Rash    N/T     Past Medical History:  Diagnosis Date  . Anxiety   . Cervicalgia   . Coronary artery disease    a. 02/2012 Stress echo: severe anterior wall ischemia;  b. 02/2012 Cath/PCI: LAD 95p (3.0 x 15 Xience EX DES), D1 90ost (PTCA - bifurcational dzs), EF 45% with anterior HK;  b. 02/2013 Ex MV: fixed anterior defect w/ minor reversibility, nl EF-->Med Rx.  . Endometrial cancer ([place])    a. 07/2016 s/p robotic hysterectomy, BSO w/ washings, sentinel node inj, mapping, bx, adhesiolysis.  . Essential hypertension, benign   . Fibrocystic breast disease   . GERD (gastroesophageal reflux disease)   . Gestational hypertension   . Heart murmur   . History of anemia   . History of blood transfusion   . Hodgkin's lymphoma ([place]) 2011   a. s/p radiation and chemo therapy  . Osteoarthritis   . Polycystic ovarian disease      Past Surgical History:  Procedure Laterality Date  . ABDOMINAL HYSTERECTOMY    . bladder sling    . CARDIAC CATHETERIZATION  02/2012   [facility] 1 stent place  . CERVICAL POLYPECTOMY    . CHOLECYSTECTOMY  1982  . COLONOSCOPY WITH PROPOFOL N/A 02/05/2015   Procedure: COLONOSCOPY WITH PROPOFOL;  Surgeon: Lucilla Lame, MD;  Location: [facility] END[facility]COPY;  Service: Endoscopy;  Laterality: N/A;  . CORONARY ANGIOPLASTY  02/2012   left/right s/p balloon  . CYSTOGRAM N/A 08/17/2016   Procedure: CYSTOGRAM;  Surgeon: Hollice Espy, MD;  Location: [facility] ORS;  Service: Urology;  Laterality: N/A;  . CYSTOSCOPY  N/A 08/17/2016   Procedure: CYSTOSCOPY EXAM UNDER ANESTHESIA;  Surgeon: Hollice Espy, MD;  Location: [facility] ORS;  Service: Urology;  Laterality: N/A;  . CYSTOSCOPY W/ RETROGRADES Bilateral 08/17/2016   Procedure: CYSTOSCOPY  WITH RETROGRADE PYELOGRAM;  Surgeon: Hollice Espy, MD;  Location: [facility] ORS;  Service: Urology;  Laterality: Bilateral;  . CYSTOSCOPY WITH STENT PLACEMENT Right 08/17/2016   Procedure: CYSTOSCOPY WITH STENT PLACEMENT;  Surgeon: Hollice Espy, MD;  Location: [facility];  Service: Urology;  Laterality: Right;  . heart stent'  2013  . kidney stent Right 2018  . LYMPH NODE BIOPSY  2011   diagnosis of hodgkins lymphoma  . PELVIC LYMPH NODE DISSECTION N/A 07/29/2016   Procedure: PELVIC/AORTIC LYMPH NODE SAMPLING;  Surgeon: [name], MD;  Location: [facility] OR[facility];  Service: Gynecology;  Laterality: N/A;  . PORTA CATH INSERTION N/A 09/22/2016   Procedure: Glori Luis Cath Insertion;  Surgeon: [name], MD;  Location: [facility];  Service: Cardiovascular;  Laterality: N/A;  . PORTA CATH REMOVAL N/A 11/17/2016   Procedure: Glori Luis Cath Removal;  Surgeon: [name], MD;  Location: [facility];  Service: Cardiovascular;  Laterality: N/A;  . ROBOTIC ASSISTED TOTAL HYSTERECTOMY WITH BILATERAL SALPINGO OOPHERECTOMY N/A 07/29/2016   Procedure: ROBOTIC ASSISTED TOTAL HYSTERECTOMY WITH BILATERAL SALPINGO OOPHORECTOMY;  Surgeon: [name], MD;  Location: [facility];  Service: Gynecology;  Laterality: N/A;  . SENTINEL NODE BIOPSY N/A 07/29/2016   Procedure: SENTINEL NODE BIOPSY;  Surgeon: [name], MD;  Location: [facility];  Service: Gynecology;  Laterality: N/A;  . transobturator sling N/A 2009   [place] History   Social History  . Marital status: Widowed    Spouse name: N/A  . Number of children: N/A  . Years of education: N/A   Occupational History  . Not on file.   Social History Main Topics  . Smoking status: Former Smoker    Packs/day: 1.00    Years: 30.00    Types: Cigarettes    Quit date: 07/24/2002  . Smokeless tobacco: Never Used     Comment: quit smoking in 2000  . Alcohol use Yes     Comment: occasional, 1-2 drinks per  month  . Drug use: No  . Sexual activity: No   Other Topics Concern  . Not on file   Social History Narrative  . No narrative on file    Family History  Problem Relation Age of Onset  . ALS Father   . Polymyositis Father   . Diabetes Brother   . Cancer Maternal Aunt        breast  . Breast cancer Maternal Aunt        30's  . Stroke Maternal Grandmother   . Cancer Maternal Grandfather        prostate  . Stroke Maternal Grandfather      Current Outpatient Prescriptions:  .  acetaminophen (TYLENOL) 500 MG tablet, Take 1,000 mg by mouth every 6 (six) hours as needed for mild pain. , Disp: , Rfl:  .  aspirin EC 81 MG tablet, Take 81 mg by mouth at bedtime., Disp: , Rfl:  .  atorvastatin (LIPITOR) 10 MG tablet, TAKE ONE TABLET BY MOUTH ONCE DAILY (Patient taking differently: Take 10 mg by mouth at bedtime. ), Disp: 90 tablet, Rfl: 3 .  Cholecalciferol (VITAMIN D3) 2000 units TABS, Take 2,000 Units by mouth 3 (three) times a week., Disp: , Rfl:  .  esomeprazole (  NEXIUM) 20 MG capsule, Take 20 mg by mouth daily before breakfast. , Disp: , Rfl:  .  ibuprofen (ADVIL,MOTRIN) 200 MG tablet, Take 200 mg by mouth every 6 (six) hours as needed for mild pain. , Disp: , Rfl:  .  metoprolol tartrate (LOPRESSOR) 25 MG tablet, TAKE ONE TABLET BY MOUTH TWICE DAILY, Disp: 180 tablet, Rfl: 2 .  naproxen sodium (ANAPROX) 220 MG tablet, Take 440 mg by mouth 2 (two) times daily as needed (for pain/headache.)., Disp: , Rfl:  .  Turmeric 500 MG CAPS, Take 500 mg by mouth 2 (two) times daily., Disp: , Rfl:  .  dexamethasone (DECADRON) 4 MG tablet, Take 2 tablets (8 mg total) by mouth daily. Start the day after chemotherapy for 2 days. (Patient not taking: Reported on 11/23/2016), Disp: 30 tablet, Rfl: 1 .  diazepam (VALIUM) 5 MG tablet, Take 1 tablet (5 mg total) by mouth every 12 (twelve) hours as needed for anxiety. (Patient not taking: Reported on 12/10/2016), Disp: 30 tablet, Rfl: 0 .  DULoxetine  (CYMBALTA) 30 MG capsule, Take 1 capsule (30 mg total) by mouth daily., Disp: 7 capsule, Rfl: 0 .  DULoxetine (CYMBALTA) 60 MG capsule, Take 1 capsule (60 mg total) by mouth daily., Disp: 30 capsule, Rfl: 3 .  gabapentin (NEURONTIN) 300 MG capsule, Take 1 capsule (300 mg total) by mouth 3 (three) times daily. (Patient not taking: Reported on 11/12/2016), Disp: 90 capsule, Rfl: 2 .  LORazepam (ATIVAN) 0.5 MG tablet, Take 1 tablet (0.5 mg total) by mouth every 6 (six) hours as needed (Nausea or vomiting). (Patient not taking: Reported on 11/23/2016), Disp: 30 tablet, Rfl: 0 .  nitroGLYCERIN (NITROSTAT) 0.4 MG SL tablet, Place 1 tablet (0.4 mg total) under the tongue every 5 (five) minutes as needed for chest pain. (Patient not taking: Reported on 12/10/2016), Disp: 25 tablet, Rfl: 3 .  ondansetron (ZOFRAN) 8 MG tablet, Take 1 tablet (8 mg total) by mouth 2 (two) times daily as needed for refractory nausea / vomiting. Start on day 3 after chemo. (Patient not taking: Reported on 11/23/2016), Disp: 30 tablet, Rfl: 1 .  oxyCODONE (OXY IR/ROXICODONE) 5 MG immediate release tablet, Take 1 tablet (5 mg total) by mouth every 6 (six) hours as needed for severe pain. (Patient not taking: Reported on 12/10/2016), Disp: 30 tablet, Rfl: 0 .  prochlorperazine (COMPAZINE) 10 MG tablet, Take 1 tablet (10 mg total) by mouth every 6 (six) hours as needed (Nausea or vomiting). (Patient not taking: Reported on 11/23/2016), Disp: 30 tablet, Rfl: 1  Physical exam:  Vitals:   12/10/16 1405  BP: 108/64  Pulse: 71  Resp: 18  Temp: 97.5 F (36.4 C)  TempSrc: Tympanic  Weight: 173 lb 3 oz (78.6 kg)   Physical Exam  Constitutional: She is oriented to person, place, and time and well-developed, well-nourished, and in no distress.  HENT:  Head: Normocephalic and atraumatic.  Eyes: EOM are normal. Pupils are equal, round, and reactive to light.  Neck: Normal range of motion.  Cardiovascular: Normal rate, regular rhythm and  normal heart sounds.   Pulmonary/Chest: Effort normal and breath sounds normal.  Port is out and the site looks clean without any infection  Abdominal: Soft. Bowel sounds are normal.  Neurological: She is alert and oriented to person, place, and time.  Skin: Skin is warm and dry.  Mild nail changes and whitish discoloration of fingertips     CMP Latest Ref Rng & Units 12/10/2016  Glucose 65 -  99 mg/dL 102(H)  BUN 6 - 20 mg/dL 16  Creatinine 0.44 - 1.00 mg/dL 1.02(H)  Sodium 135 - 145 mmol/L 140  Potassium 3.5 - 5.1 mmol/L 4.1  Chloride 101 - 111 mmol/L 105  CO2 22 - 32 mmol/L 29  Calcium 8.9 - 10.3 mg/dL 9.3  Total Protein 6.5 - 8.1 g/dL 6.8  Total Bilirubin 0.3 - 1.2 mg/dL 1.1  Alkaline Phos 38 - 126 U/L 82  AST 15 - 41 U/L 24  ALT 14 - 54 U/L 18   CBC Latest Ref Rng & Units 12/10/2016  WBC 3.6 - 11.0 K/uL 6.2  Hemoglobin 12.0 - 16.0 g/dL 11.8(L)  Hematocrit 35.0 - 47.0 % 33.9(L)  Platelets 150 - 440 K/uL 323      Assessment and plan- Patient is a 66 y.o. female with h/o Stage I high risk serous endometrial cancer s/p 3 cycles of carbo/taxol.  1. Chemotherapy has been compleetd. Port is out. She will start WPRT next week and see Dr. [name] as well  2. Chemo induced peripehral neuropathy- patient agreeable to try cymbalta. Will start at 30 mg po daily and increase to 60 mg po daily next week.  RTC in 3 months with cbc, cmp   Visit Diagnosis 1. Endometrial carcinoma ([place])   2. Chemotherapy-induced peripheral neuropathy ([facility])      Dr. Randa Evens, MD, MPH [facility] at [facility] Pager- [phone] 12/10/2016 2:25 PM

## 2016-12-14 ENCOUNTER — Ambulatory Visit
Admission: RE | Admit: 2016-12-14 | Discharge: 2016-12-14 | Disposition: A | Discharge status: Home / Self Care | Payer: PPO | Source: Ambulatory Visit | Attending: Radiation Oncology | Admitting: Radiation Oncology

## 2016-12-14 ENCOUNTER — Other Ambulatory Visit: Payer: Self-pay | Admitting: *Deleted

## 2016-12-14 DIAGNOSIS — C55 Malignant neoplasm of uterus, part unspecified: Secondary | ICD-10-CM | POA: Diagnosis not present

## 2016-12-14 DIAGNOSIS — Z923 Personal history of irradiation: Secondary | ICD-10-CM | POA: Diagnosis not present

## 2016-12-14 DIAGNOSIS — Z803 Family history of malignant neoplasm of breast: Secondary | ICD-10-CM | POA: Diagnosis not present

## 2016-12-14 DIAGNOSIS — F419 Anxiety disorder, unspecified: Secondary | ICD-10-CM | POA: Diagnosis not present

## 2016-12-14 DIAGNOSIS — Z90722 Acquired absence of ovaries, bilateral: Secondary | ICD-10-CM | POA: Diagnosis not present

## 2016-12-14 DIAGNOSIS — Z7982 Long term (current) use of aspirin: Secondary | ICD-10-CM | POA: Diagnosis not present

## 2016-12-14 DIAGNOSIS — R011 Cardiac murmur, unspecified: Secondary | ICD-10-CM | POA: Diagnosis not present

## 2016-12-14 DIAGNOSIS — C541 Malignant neoplasm of endometrium: Secondary | ICD-10-CM | POA: Diagnosis not present

## 2016-12-14 DIAGNOSIS — Z90710 Acquired absence of both cervix and uterus: Secondary | ICD-10-CM | POA: Diagnosis not present

## 2016-12-14 DIAGNOSIS — Z171 Estrogen receptor negative status [ER-]: Secondary | ICD-10-CM | POA: Diagnosis not present

## 2016-12-14 DIAGNOSIS — N6019 Diffuse cystic mastopathy of unspecified breast: Secondary | ICD-10-CM | POA: Diagnosis not present

## 2016-12-14 DIAGNOSIS — Z79899 Other long term (current) drug therapy: Secondary | ICD-10-CM | POA: Diagnosis not present

## 2016-12-14 DIAGNOSIS — M1990 Unspecified osteoarthritis, unspecified site: Secondary | ICD-10-CM | POA: Diagnosis not present

## 2016-12-14 DIAGNOSIS — Z8572 Personal history of non-Hodgkin lymphomas: Secondary | ICD-10-CM | POA: Diagnosis not present

## 2016-12-14 DIAGNOSIS — Z87891 Personal history of nicotine dependence: Secondary | ICD-10-CM | POA: Diagnosis not present

## 2016-12-14 DIAGNOSIS — I2510 Atherosclerotic heart disease of native coronary artery without angina pectoris: Secondary | ICD-10-CM | POA: Diagnosis not present

## 2016-12-14 DIAGNOSIS — M542 Cervicalgia: Secondary | ICD-10-CM | POA: Diagnosis not present

## 2016-12-14 DIAGNOSIS — I10 Essential (primary) hypertension: Secondary | ICD-10-CM | POA: Diagnosis not present

## 2016-12-14 DIAGNOSIS — Z809 Family history of malignant neoplasm, unspecified: Secondary | ICD-10-CM | POA: Diagnosis not present

## 2016-12-14 DIAGNOSIS — Z510 Encounter for antineoplastic radiation therapy: Secondary | ICD-10-CM | POA: Diagnosis not present

## 2016-12-14 DIAGNOSIS — K219 Gastro-esophageal reflux disease without esophagitis: Secondary | ICD-10-CM | POA: Diagnosis not present

## 2016-12-15 ENCOUNTER — Ambulatory Visit
Admission: RE | Admit: 2016-12-15 | Discharge: 2016-12-15 | Disposition: A | Discharge status: Home / Self Care | Payer: PPO | Source: Ambulatory Visit | Attending: Radiation Oncology | Admitting: Radiation Oncology

## 2016-12-15 DIAGNOSIS — Z510 Encounter for antineoplastic radiation therapy: Secondary | ICD-10-CM | POA: Diagnosis not present

## 2016-12-17 ENCOUNTER — Ambulatory Visit
Admission: RE | Admit: 2016-12-17 | Discharge: 2016-12-17 | Disposition: A | Discharge status: Home / Self Care | Payer: PPO | Source: Ambulatory Visit | Attending: Radiation Oncology | Admitting: Radiation Oncology

## 2016-12-17 DIAGNOSIS — I10 Essential (primary) hypertension: Secondary | ICD-10-CM | POA: Diagnosis not present

## 2016-12-17 DIAGNOSIS — Z87891 Personal history of nicotine dependence: Secondary | ICD-10-CM | POA: Diagnosis not present

## 2016-12-17 DIAGNOSIS — C55 Malignant neoplasm of uterus, part unspecified: Secondary | ICD-10-CM | POA: Diagnosis not present

## 2016-12-17 DIAGNOSIS — R011 Cardiac murmur, unspecified: Secondary | ICD-10-CM | POA: Diagnosis not present

## 2016-12-17 DIAGNOSIS — Z510 Encounter for antineoplastic radiation therapy: Secondary | ICD-10-CM | POA: Diagnosis not present

## 2016-12-17 DIAGNOSIS — K219 Gastro-esophageal reflux disease without esophagitis: Secondary | ICD-10-CM | POA: Diagnosis not present

## 2016-12-17 DIAGNOSIS — M1990 Unspecified osteoarthritis, unspecified site: Secondary | ICD-10-CM | POA: Diagnosis not present

## 2016-12-17 DIAGNOSIS — Z923 Personal history of irradiation: Secondary | ICD-10-CM | POA: Diagnosis not present

## 2016-12-17 DIAGNOSIS — Z171 Estrogen receptor negative status [ER-]: Secondary | ICD-10-CM | POA: Diagnosis not present

## 2016-12-17 DIAGNOSIS — Z803 Family history of malignant neoplasm of breast: Secondary | ICD-10-CM | POA: Diagnosis not present

## 2016-12-17 DIAGNOSIS — Z7982 Long term (current) use of aspirin: Secondary | ICD-10-CM | POA: Diagnosis not present

## 2016-12-17 DIAGNOSIS — Z79899 Other long term (current) drug therapy: Secondary | ICD-10-CM | POA: Diagnosis not present

## 2016-12-17 DIAGNOSIS — N6019 Diffuse cystic mastopathy of unspecified breast: Secondary | ICD-10-CM | POA: Diagnosis not present

## 2016-12-17 DIAGNOSIS — I2510 Atherosclerotic heart disease of native coronary artery without angina pectoris: Secondary | ICD-10-CM | POA: Diagnosis not present

## 2016-12-17 DIAGNOSIS — Z809 Family history of malignant neoplasm, unspecified: Secondary | ICD-10-CM | POA: Diagnosis not present

## 2016-12-17 DIAGNOSIS — M542 Cervicalgia: Secondary | ICD-10-CM | POA: Diagnosis not present

## 2016-12-17 DIAGNOSIS — Z90710 Acquired absence of both cervix and uterus: Secondary | ICD-10-CM | POA: Diagnosis not present

## 2016-12-17 DIAGNOSIS — Z90722 Acquired absence of ovaries, bilateral: Secondary | ICD-10-CM | POA: Diagnosis not present

## 2016-12-17 DIAGNOSIS — F419 Anxiety disorder, unspecified: Secondary | ICD-10-CM | POA: Diagnosis not present

## 2016-12-17 DIAGNOSIS — Z8572 Personal history of non-Hodgkin lymphomas: Secondary | ICD-10-CM | POA: Diagnosis not present

## 2016-12-18 ENCOUNTER — Ambulatory Visit
Admission: RE | Admit: 2016-12-18 | Discharge: 2016-12-18 | Disposition: A | Discharge status: Home / Self Care | Payer: PPO | Source: Ambulatory Visit | Attending: Radiation Oncology | Admitting: Radiation Oncology

## 2016-12-18 DIAGNOSIS — Z510 Encounter for antineoplastic radiation therapy: Secondary | ICD-10-CM | POA: Diagnosis not present

## 2016-12-21 ENCOUNTER — Ambulatory Visit
Admission: RE | Admit: 2016-12-21 | Discharge: 2016-12-21 | Disposition: A | Discharge status: Home / Self Care | Payer: PPO | Source: Ambulatory Visit | Attending: Radiation Oncology | Admitting: Radiation Oncology

## 2016-12-21 DIAGNOSIS — Z510 Encounter for antineoplastic radiation therapy: Secondary | ICD-10-CM | POA: Diagnosis not present

## 2016-12-22 ENCOUNTER — Ambulatory Visit
Admission: RE | Admit: 2016-12-22 | Discharge: 2016-12-22 | Disposition: A | Discharge status: Home / Self Care | Payer: PPO | Source: Ambulatory Visit | Attending: Radiation Oncology | Admitting: Radiation Oncology

## 2016-12-22 DIAGNOSIS — Z510 Encounter for antineoplastic radiation therapy: Secondary | ICD-10-CM | POA: Diagnosis not present

## 2016-12-22 DIAGNOSIS — C541 Malignant neoplasm of endometrium: Secondary | ICD-10-CM | POA: Diagnosis not present

## 2016-12-23 ENCOUNTER — Inpatient Hospital Stay: Payer: PPO | Attending: Obstetrics and Gynecology | Admitting: Obstetrics and Gynecology

## 2016-12-23 ENCOUNTER — Ambulatory Visit
Admission: RE | Admit: 2016-12-23 | Discharge: 2016-12-23 | Disposition: A | Discharge status: Home / Self Care | Payer: PPO | Source: Ambulatory Visit | Attending: Radiation Oncology | Admitting: Radiation Oncology

## 2016-12-23 ENCOUNTER — Inpatient Hospital Stay: Payer: PPO

## 2016-12-23 VITALS — BP 104/64 | HR 71 | Temp 97.5°F | Resp 18 | Ht 61.0 in | Wt 173.3 lb

## 2016-12-23 DIAGNOSIS — Z510 Encounter for antineoplastic radiation therapy: Secondary | ICD-10-CM | POA: Diagnosis not present

## 2016-12-23 DIAGNOSIS — I2510 Atherosclerotic heart disease of native coronary artery without angina pectoris: Secondary | ICD-10-CM | POA: Insufficient documentation

## 2016-12-23 DIAGNOSIS — I872 Venous insufficiency (chronic) (peripheral): Secondary | ICD-10-CM

## 2016-12-23 DIAGNOSIS — Z90722 Acquired absence of ovaries, bilateral: Secondary | ICD-10-CM

## 2016-12-23 DIAGNOSIS — Z6830 Body mass index (BMI) 30.0-30.9, adult: Secondary | ICD-10-CM | POA: Diagnosis not present

## 2016-12-23 DIAGNOSIS — Z79899 Other long term (current) drug therapy: Secondary | ICD-10-CM | POA: Insufficient documentation

## 2016-12-23 DIAGNOSIS — I700 Atherosclerosis of aorta: Secondary | ICD-10-CM | POA: Insufficient documentation

## 2016-12-23 DIAGNOSIS — N821 Other female urinary-genital tract fistulae: Secondary | ICD-10-CM

## 2016-12-23 DIAGNOSIS — C541 Malignant neoplasm of endometrium: Secondary | ICD-10-CM | POA: Insufficient documentation

## 2016-12-23 DIAGNOSIS — Z90710 Acquired absence of both cervix and uterus: Secondary | ICD-10-CM | POA: Insufficient documentation

## 2016-12-23 DIAGNOSIS — I739 Peripheral vascular disease, unspecified: Secondary | ICD-10-CM | POA: Insufficient documentation

## 2016-12-23 DIAGNOSIS — E669 Obesity, unspecified: Secondary | ICD-10-CM

## 2016-12-23 DIAGNOSIS — Z87891 Personal history of nicotine dependence: Secondary | ICD-10-CM | POA: Diagnosis not present

## 2016-12-23 DIAGNOSIS — M1990 Unspecified osteoarthritis, unspecified site: Secondary | ICD-10-CM | POA: Insufficient documentation

## 2016-12-23 DIAGNOSIS — Z955 Presence of coronary angioplasty implant and graft: Secondary | ICD-10-CM | POA: Diagnosis not present

## 2016-12-23 DIAGNOSIS — Z8571 Personal history of Hodgkin lymphoma: Secondary | ICD-10-CM | POA: Diagnosis not present

## 2016-12-23 DIAGNOSIS — Z9221 Personal history of antineoplastic chemotherapy: Secondary | ICD-10-CM | POA: Diagnosis not present

## 2016-12-23 DIAGNOSIS — Z7982 Long term (current) use of aspirin: Secondary | ICD-10-CM | POA: Diagnosis not present

## 2016-12-23 LAB — CBC
HCT: 35 % (ref 35.0–47.0)
Hemoglobin: 12.3 g/dL (ref 12.0–16.0)
MCH: 32.7 pg (ref 26.0–34.0)
MCHC: 35.2 g/dL (ref 32.0–36.0)
MCV: 92.8 fL (ref 80.0–100.0)
Platelets: 184 10*3/uL (ref 150–440)
RBC: 3.78 MIL/uL — ABNORMAL LOW (ref 3.80–5.20)
RDW: 17 % — ABNORMAL HIGH (ref 11.5–14.5)
WBC: 4.2 10*3/uL (ref 3.6–11.0)

## 2016-12-23 NOTE — Progress Notes (Signed)
Patient here today for follow up. No compalints today.

## 2016-12-23 NOTE — Progress Notes (Signed)
Gynecologic Oncology Interval Visit   Referring Provider: Dr. 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 G2P2 female who is seen in consultation from [name] and Garwin Brothers for endometrial cancer.  Completed 3 cycles of carbo/taxol with Dr. Janese Banks end of May and has alopecia.  IV port placed and removed.  Has some neuropathy in hands due to prior chemo in past for Hodgkin's disease and more recent carbo/taxol chemo.  Taxol dose was reduced 25% with cycle 3 due to neuropathy.  Now receiving whole pelvis radiation with Dr Baruch Gouty for past week and has 4 more weeks of treatment.   Has some nausea.  Gynecologic Oncology History Jaime Hall is a pleasant G33P2 female who is seen in consultation from [name] and Garwin Brothers for grade 1 endometrial cancer. See prior note for complete details.   Endometrial biopsy done 07/15/16 showed grade 1 endometrial cancer. Surgical management was recommended.   History is also significant for h/o Hodgkin's Lymphoma 2011 treated successfully at [facility] with chemo and RT.   Bladder sling for SUI 2009.  Benign cervical polyp removed 5/16. Has some leg edema due to chronic venous insufficiency.  Had angioplasty and cardiac stents in 2013 and was on Plavix for three years, now just ASA.  Surgical management was recommended.   On 07/29/2016 she underwent robotic hysterectomy, and bilateral salpingo-oophorectomy with washings as well as sentinel node injection, mapping, and biopsy; pelvic adhesiolysis including enterolysis and ovariolysis > 45 minutes. She was noted to have severe pelvic adhesive disease involving gynecologic organs and the bowel. The cul de sac was obliterated and adherent to the posterior cervix. A bubble test was performed and was negative.   Postop she presented with fevers on 08/05/2016 and was admitted. CT scan 08/05/1016 revealed small pockets of air in the posterior pelvic floor  likely postsurgical. There is a 1.8 x 3.8 cm complex fluid collection containing pockets of air with somewhat organized walls within the pelvis at the hysterectomy bed most consistent with developing abscess. There is superior extension of loculated fluid along the left lateral pelvic wall anterior to the left psoas muscle. The component of the loculated fluid along the left lateral pelvic wall measures 2.0 x 4.0 cm and fluid collection along the anterior surface of the left psoas muscle measures approximately 1.8 x 3.6 cm. Blood cultures: + bacterimia with bacteroides fragilis; urine cultures negative.   She was treated with IV antibiotics with improvement in her symptoms. She was discharged on 09/05/2015 on Augmentin.   She was seen by Dr. Leonides Schanz and complained of vaginal leakage. 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 Subsequently she underwent a double tied tests with oral Pyridium as well as backfilling the bladder with blue. 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  Fluid in her vaginal vault was also appreciated. She was referred to Dr. [name], Urology on 08/14/2016. No obvious fistula or leak appreciated on CT urogram, although there is no complete delayed imaging to assess the right-sided ureter.   CT urogram 08/13/2016 IMPRESSION: 1. Stable size of an abscess at the vaginal cuff with superior extension along the left pelvic sidewall to the posterior margin of the mid sigmoid colon. 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 2. No hydronephrosis. Normal caliber ureters. No evidence of contrast extravasation from the ureters or bladder, with limitations as described. 3. No evidence of metastatic  disease in the abdomen or pelvis. 4. Aortic atherosclerosis.   On 08/17/3016 she underwent Exam under anesthesia, vaginoscopy, cystoscopy, cystogram with interpretation of fluoroscopy less than 30  minutes, bilateral retrograde pyelogram, and right ureteral stent placement.   Intraoperative findings: No obvious ureterovaginal vesicovaginal fistula appreciated. Mild right hydroureteronephrosis down to level of the distal ureter with slight medial deviation without filling defects. Slight mucopurulent drainage from left apical vaginal cuff, specimen cultured.  Culture revealed bateroides fragilis and she was started on a 2 week course of Flagyl iniated on 08/24/2016.  Since that time she continues to do well. She presents today to discuss pathology findings and treatment plan. She is due to have the stent removed on 09/22/2016.   Pathology DIAGNOSIS:  A. SENTINEL LYMPH NODE, RIGHT MID OBTURATOR; EXCISION:  - NO TUMOR SEEN IN ONE LYMPH NODE (0/1).   B. SENTINEL LYMPH NODE, RIGHT EXTERNAL ILIAC; EXCISION:  - NO TUMOR SEEN IN ONE LYMPH NODE (0/1).   C. SENTINEL LYMPH NODE, LEFT EXTERNAL ILIAC; EXCISION:  - NO TUMOR SEEN IN ONE LYMPH NODE (0/1).   D. UTERUS WITH CERVIX, BILATERAL FALLOPIAN TUBES AND OVARIES;  HYSTERECTOMY AND BILATERAL SALPINGO-OOPHORECTOMY:  - SEROUS CARCINOMA, 4.5 CM(SEE NOTE).  - LYMPHOVASCULAR INVASION PRESENT.  - NO TUMOR SEEN IN BILATERAL OVARIES AND FALLOPIAN TUBES.  ENDOMETRIUM:  Procedure: Hysterectomy with bilateral salpingo-oophorectomy  Histologic Type: Serous carcinoma  Myometrial Invasion: Present       Depth of invasion (millimeters): 13 mm  Myometrial thickness (millimeters): 14 mm       Percentage of myometrial invasion: 93%  Uterine Serosa Involvement: Not identified  Cervical Stromal Involvement: Not identified    BIOMARKER REPORTING TEMPLATE:  p53 Expression: Abnormal strong diffuse overexpression (>90%)  ER/PR negative.  MS stable Her2neu assessment was performed given serous histology and was negative.   CT scan on 09/10/2016 with findings that are very reassuring so stent removed. 1. Interval resolution of abscess involving  the vaginal cuff. 2. The proximal end of the right ureteral stent is coiled in the proximal right ureter. No hydronephrosis. 3. No evidence of metastatic disease or other significant changes. 4. Aortic atherosclerosis.   She was referred to Dr. Janese Banks for adjuvant chemotherapy.   Problem List: Patient Active Problem List   Diagnosis Date Noted  . Chemotherapy-induced peripheral neuropathy ([place]) 12/10/2016  . 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 SIRS (systemic inflammatory response syndrome) ([place]) 08/05/2016  . Endometrial cancer ([place])   . Pelvic adhesive disease   . Lymphedema 07/20/2016  . Chronic venous insufficiency 07/20/2016  . PAD (peripheral artery disease) () 07/20/2016  . Postmenopausal bleeding 07/14/2016  . Influenza 07/14/2016  . Class 1 obesity due to excess calories without serious comorbidity with body mass index (BMI) of 34.0 to 34.9 in adult 05/27/2016  . Chronic cough 12/19/2015  . Pre-syncope 12/19/2015  . Hx of colonic polyps   . Benign neoplasm of sigmoid colon   . Dysphagia, pharyngoesophageal phase 10/01/2014  . Prolapsed, uterovaginal, incomplete 06/24/2014  . Routine general medical examination at a health care facility 12/30/2012  . Obesity (BMI 30-39.9) 12/30/2012  . Hx of multiple pulmonary nodules 12/28/2012  . Plantar fasciitis of right foot 12/28/2012  . Neuropathy associated with lymphoma ([place]) 09/12/2012  . Edema of both legs 09/12/2012  . Hip pain, bilateral 09/12/2012  . History of Hodgkin's lymphoma 09/12/2012  . Coronary artery disease   . Hyperlipidemia   . Chest pain on exertion 02/29/2012    Past Medical History: Past Medical  History:  Diagnosis Date  . Anxiety   . Cervicalgia   . Coronary artery disease    a. 02/2012 Stress echo: severe anterior wall ischemia;  b. 02/2012 Cath/PCI: LAD 95p (3.0 x 15 Xience EX DES), D1 90ost (PTCA - bifurcational dzs), EF 45% with anterior HK;  b. 02/2013 Ex MV: fixed anterior defect w/ minor  reversibility, nl EF-->Med Rx.  . Endometrial cancer ([place])    a. 07/2016 s/p robotic hysterectomy, BSO w/ washings, sentinel node inj, mapping, bx, adhesiolysis.  . Essential hypertension, benign   . Fibrocystic breast disease   . GERD (gastroesophageal reflux disease)   . Gestational hypertension   . Heart murmur   . History of anemia   . History of blood transfusion   . Hodgkin's lymphoma ([place]) 2011   a. s/p radiation and chemo therapy  . Osteoarthritis   . Polycystic ovarian disease     Past Surgical History: Past Surgical History:  Procedure Laterality Date  . ABDOMINAL HYSTERECTOMY    . bladder sling    . CARDIAC CATHETERIZATION  02/2012   [facility] 1 stent place  . CERVICAL POLYPECTOMY    . CHOLECYSTECTOMY  1982  . COLONOSCOPY WITH PROPOFOL N/A 02/05/2015   Procedure: COLONOSCOPY WITH PROPOFOL;  Surgeon: Lucilla Lame, MD;  Location: [facility] ENDOSCOPY;  Service: Endoscopy;  Laterality: N/A;  . CORONARY ANGIOPLASTY  02/2012   left/right s/p balloon  . CYSTOGRAM N/A 08/17/2016   Procedure: CYSTOGRAM;  Surgeon: Hollice Espy, MD;  Location: [facility] ORS;  Service: Urology;  Laterality: N/A;  . CYSTOSCOPY N/A 08/17/2016   Procedure: CYSTOSCOPY EXAM UNDER ANESTHESIA;  Surgeon: Hollice Espy, MD;  Location: [facility] ORS;  Service: Urology;  Laterality: N/A;  . CYSTOSCOPY W/ RETROGRADES Bilateral 08/17/2016   Procedure: CYSTOSCOPY WITH RETROGRADE PYELOGRAM;  Surgeon: Hollice Espy, MD;  Location: [facility] ORS;  Service: Urology;  Laterality: Bilateral;  . CYSTOSCOPY WITH STENT PLACEMENT Right 08/17/2016   Procedure: CYSTOSCOPY WITH STENT PLACEMENT;  Surgeon: Hollice Espy, MD;  Location: [facility] ORS;  Service: Urology;  Laterality: Right;  . heart stent'  2013  . kidney stent Right 2018  . LYMPH NODE BIOPSY  2011   diagnosis of hodgkins lymphoma  . PELVIC LYMPH NODE DISSECTION N/A 07/29/2016   Procedure: PELVIC/AORTIC LYMPH NODE SAMPLING;  Surgeon: [name], MD;  Location: [facility] ORS;  Service:  Gynecology;  Laterality: N/A;  . PORTA CATH INSERTION N/A 09/22/2016   Procedure: Glori Luis Cath Insertion;  Surgeon: [name], MD;  Location: [place] [facility];  Service: Cardiovascular;  Laterality: N/A;  . PORTA CATH REMOVAL N/A 11/17/2016   Procedure: Glori Luis Cath Removal;  Surgeon: [name], MD;  Location: [place] [facility];  Service: Cardiovascular;  Laterality: N/A;  . ROBOTIC ASSISTED TOTAL HYSTERECTOMY WITH BILATERAL SALPINGO OOPHERECTOMY N/A 07/29/2016   Procedure: ROBOTIC ASSISTED TOTAL HYSTERECTOMY WITH BILATERAL SALPINGO OOPHORECTOMY;  Surgeon: [name], MD;  Location: [facility] OR[facility];  Service: Gynecology;  Laterality: N/A;  . SENTINEL NODE BIOPSY N/A 07/29/2016   Procedure: SENTINEL NODE BIOPSY;  Surgeon: [name], MD;  Location: [facility] ORS;  Service: Gynecology;  Laterality: N/A;  . transobturator sling N/A 2009   [place]    Family History: Family History  Problem Relation Age of Onset  . ALS Father   . Polymyositis Father   . Diabetes Brother   . Cancer Maternal Aunt        breast  . Breast cancer Maternal Aunt  30's  . Stroke Maternal Grandmother   . Cancer Maternal Grandfather        prostate  . Stroke Maternal Grandfather     Social History: Works as Dealer Social History   Social History  . Marital status: Widowed    Spouse name: N/A  . Number of children: N/A  . Years of education: N/A   Occupational History  . Not on file.   Social History Main Topics  . Smoking status: Former Smoker    Packs/day: 1.00    Years: 30.00    Types: Cigarettes    Quit date: 07/24/2002  . Smokeless tobacco: Never Used     Comment: quit smoking in 2000  . Alcohol use Yes     Comment: occasional, 1-2 drinks per month  . Drug use: No  . Sexual activity: No   Other Topics Concern  . Not on file   Social History Narrative  . No narrative on file    Allergies: Allergies  Allergen Reactions  . Adhesive  [Tape] Other (See Comments)    Burns the skin  . Z-Pak [Azithromycin] Itching  . Antifungal [Miconazole Nitrate] Rash  . Sulfa Antibiotics Nausea And Vomiting and Rash    N/T    Current Medications: Current Outpatient Prescriptions  Medication Sig Dispense Refill  . acetaminophen (TYLENOL) 500 MG tablet Take 1,000 mg by mouth every 6 (six) hours as needed for mild pain.     Marland Kitchen aspirin EC 81 MG tablet Take 81 mg by mouth at bedtime.    Marland Kitchen atorvastatin (LIPITOR) 10 MG tablet TAKE ONE TABLET BY MOUTH ONCE DAILY (Patient taking differently: Take 10 mg by mouth at bedtime. ) 90 tablet 3  . Cholecalciferol (VITAMIN D3) 2000 units TABS Take 2,000 Units by mouth 3 (three) times a week.    . diazepam (VALIUM) 5 MG tablet Take 1 tablet (5 mg total) by mouth every 12 (twelve) hours as needed for anxiety. 30 tablet 0  . DULoxetine (CYMBALTA) 30 MG capsule Take 1 capsule (30 mg total) by mouth daily. 7 capsule 0  . esomeprazole (NEXIUM) 20 MG capsule Take 20 mg by mouth daily before breakfast.     . ibuprofen (ADVIL,MOTRIN) 200 MG tablet Take 200 mg by mouth every 6 (six) hours as needed for mild pain.     . metoprolol tartrate (LOPRESSOR) 25 MG tablet TAKE ONE TABLET BY MOUTH TWICE DAILY 180 tablet 2  . naproxen sodium (ANAPROX) 220 MG tablet Take 440 mg by mouth 2 (two) times daily as needed (for pain/headache.).    Marland Kitchen nitroGLYCERIN (NITROSTAT) 0.4 MG SL tablet Place 1 tablet (0.4 mg total) under the tongue every 5 (five) minutes as needed for chest pain. 25 tablet 3  . Turmeric 500 MG CAPS Take 500 mg by mouth 2 (two) times daily.    Marland Kitchen dexamethasone (DECADRON) 4 MG tablet Take 2 tablets (8 mg total) by mouth daily. Start the day after chemotherapy for 2 days. (Patient not taking: Reported on 11/23/2016) 30 tablet 1  . gabapentin (NEURONTIN) 300 MG capsule Take 1 capsule (300 mg total) by mouth 3 (three) times daily. (Patient not taking: Reported on 11/12/2016) 90 capsule 2  . LORazepam (ATIVAN) 0.5 MG  tablet Take 1 tablet (0.5 mg total) by mouth every 6 (six) hours as needed (Nausea or vomiting). (Patient not taking: Reported on 11/23/2016) 30 tablet 0  . ondansetron (ZOFRAN) 8 MG tablet Take 1 tablet (8 mg total) by mouth 2 (two)  times daily as needed for refractory nausea / vomiting. Start on day 3 after chemo. (Patient not taking: Reported on 11/23/2016) 30 tablet 1  . oxyCODONE (OXY IR/ROXICODONE) 5 MG immediate release tablet Take 1 tablet (5 mg total) by mouth every 6 (six) hours as needed for severe pain. (Patient not taking: Reported on 12/10/2016) 30 tablet 0  . prochlorperazine (COMPAZINE) 10 MG tablet Take 1 tablet (10 mg total) by mouth every 6 (six) hours as needed (Nausea or vomiting). (Patient not taking: Reported on 11/23/2016) 30 tablet 1   No current facility-administered medications for this visit.     Review of Systems General: negative, no fevers  HEENT: concerned about swelling around the left next catheter site  Lungs: no complaints  Cardiac: no complaints  GI: no complaints  GU: no nausea, vomiting, diarrhea, constipation  Musculoskeletal: no complaints  Extremities: no complaints  Skin: redness chest around the port site  Neuro: depression  Endocrine: no complaints  Psych: no complaints     Objective:  Physical Examination:  BP 104/64 (BP Location: Left Arm, Patient Position: Sitting)   Pulse 71   Temp (!) 97.5 F (36.4 C) (Tympanic)   Resp 18   Ht '5\' 1"'  (1.549 m)   Wt 173 lb 4.8 oz (78.6 kg)   BMI 32.74 kg/m    ECOG Performance Status: 1 - Symptomatic but completely ambulatory  General appearance: alert, cooperative and appears stated age HEENT:PERRLA, extra ocular movement intact and sclera clear, anicteric Lymph node survey: non-palpable, axillary, inguinal, supraclavicular, there is a 1.5-2 cm swelling by the catheter insertion site on the left.  Abdomen: soft, non-tender, without masses or organomegaly, no hernias and well healed incision Back:  inspection of back is normal Skin exam - erythema around the chest port incision measuring about 5 cm and warm to palpation.  Neurological exam reveals alert, oriented, normal speech, no focal findings or movement disorder noted.  Pelvic: deferred based on last exam. exam chaperoned by nurse;  Vulva: normal appearing vulva with no masses, tenderness or lesions; Vagina: normal vagina;  Adnexa/Uterus/Cervix:absent. Bimanual: no masses or tenderness.  Rectal: normal rectal, confirmatory    Assessment:  Jaime Hall is a 66 y.o. 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 Complete resolution of vaginal cuff abscess.  Severe pelvic adhesive disease.  Now s/p 3 cycles of carboplatin/taxol and presently receiving whole pelvic radiation with Dr Baruch Gouty.  NED today.  Medical co-morbidities complicating care: 30 pack year former smoker s/p coronary angioplasty and stents 2013 and takes baby ASA.  Plan:   Problem List Items Addressed This Visit    None    Visit Diagnoses    Endometrial carcinoma ([place])    -  Primary     She will complete pelvic radiation over the next month and we will see her back for further follow up in 3 months.  She can return sooner should the need arise.    The patient's diagnosis, an outline of the further diagnostic and laboratory studies which will be required, the recommendation, and alternatives were discussed.  All questions were answered to the patient's satisfaction.  Mellody Drown, MD  CC:  [name], MD [address] [place],  [address] [phone]  Larey Days, MD  Caryl Pina  [name], MD

## 2016-12-24 ENCOUNTER — Ambulatory Visit
Admission: RE | Admit: 2016-12-24 | Discharge: 2016-12-24 | Disposition: A | Discharge status: Home / Self Care | Payer: PPO | Source: Ambulatory Visit | Attending: Radiation Oncology | Admitting: Radiation Oncology

## 2016-12-24 DIAGNOSIS — Z510 Encounter for antineoplastic radiation therapy: Secondary | ICD-10-CM | POA: Diagnosis not present

## 2016-12-25 ENCOUNTER — Ambulatory Visit
Admission: RE | Admit: 2016-12-25 | Discharge: 2016-12-25 | Disposition: A | Discharge status: Home / Self Care | Payer: PPO | Source: Ambulatory Visit | Attending: Radiation Oncology | Admitting: Radiation Oncology

## 2016-12-25 DIAGNOSIS — Z510 Encounter for antineoplastic radiation therapy: Secondary | ICD-10-CM | POA: Diagnosis not present

## 2016-12-28 ENCOUNTER — Ambulatory Visit: Admission: RE | Admit: 2016-12-28 | Payer: PPO | Source: Ambulatory Visit

## 2016-12-28 ENCOUNTER — Ambulatory Visit: Payer: PPO

## 2016-12-28 ENCOUNTER — Ambulatory Visit
Admission: RE | Admit: 2016-12-28 | Discharge: 2016-12-28 | Disposition: A | Discharge status: Home / Self Care | Payer: PPO | Source: Ambulatory Visit | Attending: Radiation Oncology | Admitting: Radiation Oncology

## 2016-12-28 DIAGNOSIS — Z510 Encounter for antineoplastic radiation therapy: Secondary | ICD-10-CM | POA: Diagnosis not present

## 2016-12-29 ENCOUNTER — Ambulatory Visit
Admission: RE | Admit: 2016-12-29 | Discharge: 2016-12-29 | Disposition: A | Discharge status: Home / Self Care | Payer: PPO | Source: Ambulatory Visit | Attending: Radiation Oncology | Admitting: Radiation Oncology

## 2016-12-29 DIAGNOSIS — C541 Malignant neoplasm of endometrium: Secondary | ICD-10-CM | POA: Diagnosis not present

## 2016-12-29 DIAGNOSIS — Z510 Encounter for antineoplastic radiation therapy: Secondary | ICD-10-CM | POA: Diagnosis not present

## 2016-12-30 ENCOUNTER — Inpatient Hospital Stay: Payer: PPO

## 2016-12-30 ENCOUNTER — Ambulatory Visit
Admission: RE | Admit: 2016-12-30 | Discharge: 2016-12-30 | Disposition: A | Discharge status: Home / Self Care | Payer: PPO | Source: Ambulatory Visit | Attending: Radiation Oncology | Admitting: Radiation Oncology

## 2016-12-30 DIAGNOSIS — C541 Malignant neoplasm of endometrium: Secondary | ICD-10-CM | POA: Diagnosis not present

## 2016-12-30 DIAGNOSIS — Z510 Encounter for antineoplastic radiation therapy: Secondary | ICD-10-CM | POA: Diagnosis not present

## 2016-12-30 LAB — CBC
HCT: 37.3 % (ref 35.0–47.0)
Hemoglobin: 13 g/dL (ref 12.0–16.0)
MCH: 32.5 pg (ref 26.0–34.0)
MCHC: 34.8 g/dL (ref 32.0–36.0)
MCV: 93.3 fL (ref 80.0–100.0)
Platelets: 172 10*3/uL (ref 150–440)
RBC: 4 MIL/uL (ref 3.80–5.20)
RDW: 16.3 % — ABNORMAL HIGH (ref 11.5–14.5)
WBC: 4.5 10*3/uL (ref 3.6–11.0)

## 2016-12-31 ENCOUNTER — Ambulatory Visit
Admission: RE | Admit: 2016-12-31 | Discharge: 2016-12-31 | Disposition: A | Discharge status: Home / Self Care | Payer: PPO | Source: Ambulatory Visit | Attending: Radiation Oncology | Admitting: Radiation Oncology

## 2016-12-31 DIAGNOSIS — Z510 Encounter for antineoplastic radiation therapy: Secondary | ICD-10-CM | POA: Diagnosis not present

## 2017-01-01 ENCOUNTER — Ambulatory Visit
Admission: RE | Admit: 2017-01-01 | Discharge: 2017-01-01 | Disposition: A | Discharge status: Home / Self Care | Payer: PPO | Source: Ambulatory Visit | Attending: Radiation Oncology | Admitting: Radiation Oncology

## 2017-01-01 DIAGNOSIS — Z510 Encounter for antineoplastic radiation therapy: Secondary | ICD-10-CM | POA: Diagnosis not present

## 2017-01-04 ENCOUNTER — Ambulatory Visit
Admission: RE | Admit: 2017-01-04 | Discharge: 2017-01-04 | Disposition: A | Discharge status: Home / Self Care | Payer: PPO | Source: Ambulatory Visit | Attending: Radiation Oncology | Admitting: Radiation Oncology

## 2017-01-04 DIAGNOSIS — Z510 Encounter for antineoplastic radiation therapy: Secondary | ICD-10-CM | POA: Diagnosis not present

## 2017-01-05 ENCOUNTER — Inpatient Hospital Stay: Payer: PPO

## 2017-01-05 ENCOUNTER — Telehealth: Payer: Self-pay | Admitting: *Deleted

## 2017-01-05 ENCOUNTER — Ambulatory Visit
Admission: RE | Admit: 2017-01-05 | Discharge: 2017-01-05 | Disposition: A | Discharge status: Home / Self Care | Payer: PPO | Source: Ambulatory Visit | Attending: Radiation Oncology | Admitting: Radiation Oncology

## 2017-01-05 VITALS — BP 108/63 | HR 72 | Resp 20

## 2017-01-05 DIAGNOSIS — I959 Hypotension, unspecified: Secondary | ICD-10-CM

## 2017-01-05 DIAGNOSIS — C541 Malignant neoplasm of endometrium: Secondary | ICD-10-CM

## 2017-01-05 DIAGNOSIS — Z510 Encounter for antineoplastic radiation therapy: Secondary | ICD-10-CM | POA: Diagnosis not present

## 2017-01-05 MED ORDER — DULOXETINE HCL 30 MG PO CPEP: 30.0000 mg | ORAL_CAPSULE | ORAL | 0 refills | Status: DC

## 2017-01-05 MED ORDER — SODIUM CHLORIDE 0.9 % IV SOLN
Freq: Once | INTRAVENOUS | Status: AC
  Administered 2017-01-05: 10:00:00 via INTRAVENOUS
  Filled 2017-01-05: qty 1000

## 2017-01-05 NOTE — Telephone Encounter (Signed)
Pt passed out last week, on cymbalta and she started radiation and cymbalta at same time. She hit her head with fall and the radiation staff put steristrips on the wound on head.  She did not go to ER. She has been drinking juices, water, milk, coffee. She does make sure she gets up slow.  B/p today 84/62 sitting, standing 82/60.  She is willing to get fluids today. Dr. Janese Banks told me to cut her cymbalta to 30 mg daily x 3 days then stop. Pt aware.

## 2017-01-06 ENCOUNTER — Inpatient Hospital Stay: Payer: PPO

## 2017-01-06 ENCOUNTER — Ambulatory Visit
Admission: RE | Admit: 2017-01-06 | Discharge: 2017-01-06 | Disposition: A | Discharge status: Home / Self Care | Payer: PPO | Source: Ambulatory Visit | Attending: Radiation Oncology | Admitting: Radiation Oncology

## 2017-01-06 DIAGNOSIS — C541 Malignant neoplasm of endometrium: Secondary | ICD-10-CM | POA: Diagnosis not present

## 2017-01-06 DIAGNOSIS — Z510 Encounter for antineoplastic radiation therapy: Secondary | ICD-10-CM | POA: Diagnosis not present

## 2017-01-06 LAB — CBC
HCT: 33.5 % — ABNORMAL LOW (ref 35.0–47.0)
Hemoglobin: 11.8 g/dL — ABNORMAL LOW (ref 12.0–16.0)
MCH: 32.8 pg (ref 26.0–34.0)
MCHC: 35.3 g/dL (ref 32.0–36.0)
MCV: 92.9 fL (ref 80.0–100.0)
Platelets: 170 10*3/uL (ref 150–440)
RBC: 3.61 MIL/uL — ABNORMAL LOW (ref 3.80–5.20)
RDW: 15.5 % — ABNORMAL HIGH (ref 11.5–14.5)
WBC: 4.4 10*3/uL (ref 3.6–11.0)

## 2017-01-07 ENCOUNTER — Ambulatory Visit
Admission: RE | Admit: 2017-01-07 | Discharge: 2017-01-07 | Disposition: A | Discharge status: Home / Self Care | Payer: PPO | Source: Ambulatory Visit | Attending: Radiation Oncology | Admitting: Radiation Oncology

## 2017-01-07 DIAGNOSIS — Z510 Encounter for antineoplastic radiation therapy: Secondary | ICD-10-CM | POA: Diagnosis not present

## 2017-01-08 ENCOUNTER — Ambulatory Visit
Admission: RE | Admit: 2017-01-08 | Discharge: 2017-01-08 | Disposition: A | Discharge status: Home / Self Care | Payer: PPO | Source: Ambulatory Visit | Attending: Radiation Oncology | Admitting: Radiation Oncology

## 2017-01-08 DIAGNOSIS — Z510 Encounter for antineoplastic radiation therapy: Secondary | ICD-10-CM | POA: Diagnosis not present

## 2017-01-11 ENCOUNTER — Ambulatory Visit
Admission: RE | Admit: 2017-01-11 | Discharge: 2017-01-11 | Disposition: A | Discharge status: Home / Self Care | Payer: PPO | Source: Ambulatory Visit | Attending: Radiation Oncology | Admitting: Radiation Oncology

## 2017-01-11 DIAGNOSIS — Z510 Encounter for antineoplastic radiation therapy: Secondary | ICD-10-CM | POA: Diagnosis not present

## 2017-01-12 ENCOUNTER — Ambulatory Visit (INDEPENDENT_AMBULATORY_CARE_PROVIDER_SITE_OTHER): Payer: PPO | Admitting: Internal Medicine

## 2017-01-12 ENCOUNTER — Ambulatory Visit
Admission: RE | Admit: 2017-01-12 | Discharge: 2017-01-12 | Disposition: A | Discharge status: Home / Self Care | Payer: PPO | Source: Ambulatory Visit | Attending: Radiation Oncology | Admitting: Radiation Oncology

## 2017-01-12 ENCOUNTER — Encounter: Payer: Self-pay | Admitting: Internal Medicine

## 2017-01-12 DIAGNOSIS — H6123 Impacted cerumen, bilateral: Secondary | ICD-10-CM

## 2017-01-12 DIAGNOSIS — T451X5A Adverse effect of antineoplastic and immunosuppressive drugs, initial encounter: Secondary | ICD-10-CM | POA: Diagnosis not present

## 2017-01-12 DIAGNOSIS — Z510 Encounter for antineoplastic radiation therapy: Secondary | ICD-10-CM | POA: Diagnosis not present

## 2017-01-12 DIAGNOSIS — C541 Malignant neoplasm of endometrium: Secondary | ICD-10-CM | POA: Diagnosis not present

## 2017-01-12 DIAGNOSIS — I2510 Atherosclerotic heart disease of native coronary artery without angina pectoris: Secondary | ICD-10-CM | POA: Diagnosis not present

## 2017-01-12 DIAGNOSIS — H6120 Impacted cerumen, unspecified ear: Secondary | ICD-10-CM | POA: Insufficient documentation

## 2017-01-12 DIAGNOSIS — G620 Drug-induced polyneuropathy: Secondary | ICD-10-CM | POA: Diagnosis not present

## 2017-01-12 DIAGNOSIS — D125 Benign neoplasm of sigmoid colon: Secondary | ICD-10-CM

## 2017-01-12 NOTE — Assessment & Plan Note (Addendum)
Bilateral ,  With left ear itching. RN to irrigatie both ears with warm water.

## 2017-01-12 NOTE — Patient Instructions (Signed)
Ask Dr Janese Banks if you can try Elavil  for the neuropathy and insomnia   You can use the azo 200 mg three times daily,   if needed, but a better dose is two times daily   You can stop the Lipitor for a week or two if it helps your stomach

## 2017-01-12 NOTE — Progress Notes (Signed)
Subjective:  Patient ID: Jaime Hall, female    DOB: [birth year]  Age: 66 y.o. MRN: [id number]  CC: Diagnoses of Bilateral impacted cerumen, Chemotherapy-induced peripheral neuropathy ([place]), Benign neoplasm of sigmoid colon, and Coronary artery disease involving native coronary artery of native heart without angina pectoris were pertinent to this visit.  HPI NAZ DENUNZIO presents for FOLLOW UP  On grief and other conditions. .  After her last visit in January she was sent o GYN and diagnosed with emdometrial cancer .  She underwent robotic TAH/BSO ,  Complicated by absess, treated with IV avx,  uretrovaginal fistula 3 cycles of carbo/taxol ,  Agitation with benadryyl infusion,s  peripiheral neuroapathy  and is now receiving XRT since July 3  For  A total of 5 weeks.   5 more days,   Taking azo for burning in vaginal area,  Abut having stomach . Marland Kitchen  Has been having diarrhea since mid July  Had urgency,  [address] on way to bathroom knocked herself out  Had a scalpl aceration . The following day at radiation had wound steri strips.    Was hypotensive on cymbalta for neuropathy,  improved when sh stopped the medication  Prior trial of gabapentin failed.    24 lb wt loss   Taking lipitor  Cerumen impaction.  Eft ear itching,   Lab Results  Component Value Date   CREATININE 1.02 (H) 12/10/2016   Has migraines involving left eye , managed with prn aleve  Outpatient Medications Prior to Visit  Medication Sig Dispense Refill  . acetaminophen (TYLENOL) 500 MG tablet Take 1,000 mg by mouth every 6 (six) hours as needed for mild pain.     Marland Kitchen aspirin EC 81 MG tablet Take 81 mg by mouth at bedtime.    Marland Kitchen atorvastatin (LIPITOR) 10 MG tablet TAKE ONE TABLET BY MOUTH ONCE DAILY (Patient taking differently: Take 10 mg by mouth at bedtime. ) 90 tablet 3  . Cholecalciferol (VITAMIN D3) 2000 units TABS Take 2,000 Units by mouth 3 (three) times a week.    . diazepam (VALIUM) 5 MG tablet Take 1 tablet (5  mg total) by mouth every 12 (twelve) hours as needed for anxiety. 30 tablet 0  . esomeprazole (NEXIUM) 20 MG capsule Take 20 mg by mouth daily before breakfast.     . ibuprofen (ADVIL,MOTRIN) 200 MG tablet Take 200 mg by mouth every 6 (six) hours as needed for mild pain.     . metoprolol tartrate (LOPRESSOR) 25 MG tablet TAKE ONE TABLET BY MOUTH TWICE DAILY 180 tablet 2  . naproxen sodium (ANAPROX) 220 MG tablet Take 440 mg by mouth 2 (two) times daily as needed (for pain/headache.).    Marland Kitchen nitroGLYCERIN (NITROSTAT) 0.4 MG SL tablet Place 1 tablet (0.4 mg total) under the tongue every 5 (five) minutes as needed for chest pain. 25 tablet 3  . Turmeric 500 MG CAPS Take 500 mg by mouth 2 (two) times daily.    Marland Kitchen dexamethasone (DECADRON) 4 MG tablet Take 2 tablets (8 mg total) by mouth daily. Start the day after chemotherapy for 2 days. (Patient not taking: Reported on 11/23/2016) 30 tablet 1  . DULoxetine (CYMBALTA) 30 MG capsule Take 1 capsule (30 mg total) by mouth as directed. Not sure if pt not feeling good on rx or other cause, will taper her down and then stop med. (Patient not taking: Reported on 01/12/2017) 7 capsule 0  . gabapentin (NEURONTIN) 300 MG capsule Take 1  capsule (300 mg total) by mouth 3 (three) times daily. (Patient not taking: Reported on 11/12/2016) 90 capsule 2  . LORazepam (ATIVAN) 0.5 MG tablet Take 1 tablet (0.5 mg total) by mouth every 6 (six) hours as needed (Nausea or vomiting). (Patient not taking: Reported on 11/23/2016) 30 tablet 0  . ondansetron (ZOFRAN) 8 MG tablet Take 1 tablet (8 mg total) by mouth 2 (two) times daily as needed for refractory nausea / vomiting. Start on day 3 after chemo. (Patient not taking: Reported on 11/23/2016) 30 tablet 1  . oxyCODONE (OXY IR/ROXICODONE) 5 MG immediate release tablet Take 1 tablet (5 mg total) by mouth every 6 (six) hours as needed for severe pain. (Patient not taking: Reported on 12/10/2016) 30 tablet 0  . prochlorperazine (COMPAZINE) 10  MG tablet Take 1 tablet (10 mg total) by mouth every 6 (six) hours as needed (Nausea or vomiting). (Patient not taking: Reported on 11/23/2016) 30 tablet 1   No facility-administered medications prior to visit.     Review of Systems;  Patient denies headache, fevers, malaise, unintentional weight loss, skin rash, eye pain, sinus congestion and sinus pain, sore throat, dysphagia,  hemoptysis , cough, dyspnea, wheezing, chest pain, palpitations, orthopnea, edema, abdominal pain, nausea, melena, diarrhea, constipation, flank pain, dysuria, hematuria, urinary  Frequency, nocturia, numbness, tingling, seizures,  Focal weakness, Loss of consciousness,  Tremor, insomnia, depression, anxiety, and suicidal ideation.      Objective:  BP 110/72 (BP Location: Left Arm, Patient Position: Sitting, Cuff Size: Normal)   Pulse 97   Temp 97.8 F (36.6 C) (Oral)   Resp 15   Ht 5\' 1"  (1.549 m)   Wt 171 lb 6.4 oz (77.7 kg)   SpO2 96%   BMI 32.39 kg/m   BP Readings from Last 3 Encounters:  01/12/17 110/72  01/05/17 108/63  12/23/16 104/64    Wt Readings from Last 3 Encounters:  01/12/17 171 lb 6.4 oz (77.7 kg)  12/23/16 173 lb 4.8 oz (78.6 kg)  12/10/16 173 lb 3 oz (78.6 kg)    General appearance: alert, cooperative and appears stated age Ears: normal TM's and external ear canals both ears Throat: lips, mucosa, and tongue normal; teeth and gums normal Neck: no adenopathy, no carotid bruit, supple, symmetrical, trachea midline and thyroid not enlarged, symmetric, no tenderness/mass/nodules Back: symmetric, no curvature. ROM normal. No CVA tenderness. Lungs: clear to auscultation bilaterally Heart: regular rate and rhythm, S1, S2 normal, no murmur, click, rub or gallop Abdomen: soft, non-tender; bowel sounds normal; no masses,  no organomegaly Pulses: 2+ and symmetric Skin: Skin color, texture, turgor normal. No rashes or lesions Lymph nodes: Cervical, supraclavicular, and axillary nodes  normal.  Lab Results  Component Value Date   HGBA1C 5.8 05/26/2016    Lab Results  Component Value Date   CREATININE 1.02 (H) 12/10/2016   CREATININE 1.14 (H) 11/12/2016   CREATININE 0.94 10/22/2016    Lab Results  Component Value Date   WBC 5.1 01/13/2017   HGB 12.1 01/13/2017   HCT 34.8 (L) 01/13/2017   PLT 186 01/13/2017   GLUCOSE 102 (H) 12/10/2016   CHOL 131 05/26/2016   TRIG 134.0 05/26/2016   HDL 53.90 05/26/2016   LDLCALC 51 05/26/2016   ALT 18 12/10/2016   AST 24 12/10/2016   NA 140 12/10/2016   K 4.1 12/10/2016   CL 105 12/10/2016   CREATININE 1.02 (H) 12/10/2016   BUN 16 12/10/2016   CO2 29 12/10/2016   TSH 3.92 05/26/2016  INR 1.02 08/05/2016   HGBA1C 5.8 05/26/2016   MICROALBUR 0.8 09/23/2012    No results found.  Assessment & Plan:   Problem List Items Addressed This Visit    Coronary artery disease    She has been advised to suspend the Lipitor during her radiation therapy due ot GI issues.       Chemotherapy-induced peripheral neuropathy ([facility])    Treatment failures with gabapentin and cymbalta,  Will consider trial of elavil       Cerumen impaction    Bilateral ,  With left ear itching. RN to irrigatie both ears with warm water.       Benign neoplasm of sigmoid colon    Hyperplastic polyps By colonoscopy Sept 2016.  Follow up 5 years.         I have discontinued Ms. Lovings's ondansetron, dexamethasone, prochlorperazine, LORazepam, oxyCODONE, gabapentin, and DULoxetine. I am also having her maintain her nitroGLYCERIN, atorvastatin, esomeprazole, metoprolol tartrate, aspirin EC, Turmeric, Vitamin D3, naproxen sodium, acetaminophen, ibuprofen, and diazepam.  No orders of the defined types were placed in this encounter.   Medications Discontinued During This Encounter  Medication Reason  . dexamethasone (DECADRON) 4 MG tablet Patient has not taken in last 30 days  . DULoxetine (CYMBALTA) 30 MG capsule Patient has not taken in last  30 days  . gabapentin (NEURONTIN) 300 MG capsule Patient has not taken in last 30 days  . LORazepam (ATIVAN) 0.5 MG tablet Patient has not taken in last 30 days  . ondansetron (ZOFRAN) 8 MG tablet Patient has not taken in last 30 days  . oxyCODONE (OXY IR/ROXICODONE) 5 MG immediate release tablet Patient has not taken in last 30 days  . prochlorperazine (COMPAZINE) 10 MG tablet Patient has not taken in last 30 days    Follow-up: No Follow-up on file.   [name], MD

## 2017-01-12 NOTE — Progress Notes (Signed)
Patient was seen today with [name]. Patient has cancer and is stating she has her ears impacted several occasions. Bilateral impaction was performed. Patient tolerated procedure well.

## 2017-01-13 ENCOUNTER — Inpatient Hospital Stay: Payer: PPO | Attending: Radiation Oncology

## 2017-01-13 ENCOUNTER — Ambulatory Visit
Admission: RE | Admit: 2017-01-13 | Discharge: 2017-01-13 | Disposition: A | Discharge status: Home / Self Care | Payer: PPO | Source: Ambulatory Visit | Attending: Radiation Oncology | Admitting: Radiation Oncology

## 2017-01-13 DIAGNOSIS — C541 Malignant neoplasm of endometrium: Secondary | ICD-10-CM | POA: Insufficient documentation

## 2017-01-13 DIAGNOSIS — Z510 Encounter for antineoplastic radiation therapy: Secondary | ICD-10-CM | POA: Diagnosis not present

## 2017-01-13 LAB — CBC
HCT: 34.8 % — ABNORMAL LOW (ref 35.0–47.0)
Hemoglobin: 12.1 g/dL (ref 12.0–16.0)
MCH: 32.5 pg (ref 26.0–34.0)
MCHC: 34.8 g/dL (ref 32.0–36.0)
MCV: 93.5 fL (ref 80.0–100.0)
Platelets: 186 10*3/uL (ref 150–440)
RBC: 3.72 MIL/uL — ABNORMAL LOW (ref 3.80–5.20)
RDW: 15.5 % — ABNORMAL HIGH (ref 11.5–14.5)
WBC: 5.1 10*3/uL (ref 3.6–11.0)

## 2017-01-13 NOTE — Assessment & Plan Note (Signed)
She has been advised to suspend the Lipitor during her radiation therapy due ot GI issues.

## 2017-01-13 NOTE — Assessment & Plan Note (Addendum)
Hyperplastic polyps By colonoscopy Sept 2016.  Follow up 5 years.

## 2017-01-13 NOTE — Assessment & Plan Note (Signed)
Treatment failures with gabapentin and cymbalta,  Will consider trial of elavil

## 2017-01-14 ENCOUNTER — Ambulatory Visit
Admission: RE | Admit: 2017-01-14 | Discharge: 2017-01-14 | Disposition: A | Discharge status: Home / Self Care | Payer: PPO | Source: Ambulatory Visit | Attending: Radiation Oncology | Admitting: Radiation Oncology

## 2017-01-14 DIAGNOSIS — Z510 Encounter for antineoplastic radiation therapy: Secondary | ICD-10-CM | POA: Diagnosis not present

## 2017-01-15 ENCOUNTER — Ambulatory Visit
Admission: RE | Admit: 2017-01-15 | Discharge: 2017-01-15 | Disposition: A | Discharge status: Home / Self Care | Payer: PPO | Source: Ambulatory Visit | Attending: Radiation Oncology | Admitting: Radiation Oncology

## 2017-01-15 DIAGNOSIS — Z510 Encounter for antineoplastic radiation therapy: Secondary | ICD-10-CM | POA: Diagnosis not present

## 2017-01-18 ENCOUNTER — Ambulatory Visit
Admission: RE | Admit: 2017-01-18 | Discharge: 2017-01-18 | Disposition: A | Discharge status: Home / Self Care | Payer: PPO | Source: Ambulatory Visit | Attending: Radiation Oncology | Admitting: Radiation Oncology

## 2017-01-18 DIAGNOSIS — Z510 Encounter for antineoplastic radiation therapy: Secondary | ICD-10-CM | POA: Diagnosis not present

## 2017-01-19 ENCOUNTER — Ambulatory Visit: Payer: PPO

## 2017-01-19 ENCOUNTER — Ambulatory Visit
Admission: RE | Admit: 2017-01-19 | Discharge: 2017-01-19 | Disposition: A | Discharge status: Home / Self Care | Payer: PPO | Source: Ambulatory Visit | Attending: Radiation Oncology | Admitting: Radiation Oncology

## 2017-01-19 DIAGNOSIS — C541 Malignant neoplasm of endometrium: Secondary | ICD-10-CM | POA: Diagnosis not present

## 2017-01-19 DIAGNOSIS — Z510 Encounter for antineoplastic radiation therapy: Secondary | ICD-10-CM | POA: Diagnosis not present

## 2017-01-20 ENCOUNTER — Ambulatory Visit
Admission: RE | Admit: 2017-01-20 | Discharge: 2017-01-20 | Disposition: A | Discharge status: Home / Self Care | Payer: PPO | Source: Ambulatory Visit | Attending: Internal Medicine | Admitting: Internal Medicine

## 2017-01-20 ENCOUNTER — Telehealth: Payer: Self-pay | Admitting: *Deleted

## 2017-01-20 ENCOUNTER — Inpatient Hospital Stay: Payer: PPO

## 2017-01-20 ENCOUNTER — Ambulatory Visit: Payer: PPO

## 2017-01-20 DIAGNOSIS — C541 Malignant neoplasm of endometrium: Secondary | ICD-10-CM

## 2017-01-20 DIAGNOSIS — M7989 Other specified soft tissue disorders: Secondary | ICD-10-CM

## 2017-01-20 DIAGNOSIS — M79605 Pain in left leg: Secondary | ICD-10-CM | POA: Insufficient documentation

## 2017-01-20 NOTE — Telephone Encounter (Signed)
Pt called and said that she had fell about 2-3 weeks ago but over time her left leg has more swelling and it is behind her left knee and upper and below. It includes her toes and feet. She has some swelling before she started radiation and chemo and was given SVC booties that she wears to hopefully help lymph nodes on that side. This is worse and more painful  And it was the leg that got the brunt of her fall.  I spoke to Dr. B on call and he said u/s. Ordered one for today at pm at [facility]/  Pt agreeable to this appt.

## 2017-01-21 ENCOUNTER — Telehealth: Payer: Self-pay | Admitting: *Deleted

## 2017-01-21 NOTE — Telephone Encounter (Signed)
Pt. Called and wanted to know about results.  I told her that there was no clot in her leg but she did have baker's cyst.  She is having a lot of pain and more to the back of knee.  She has swelling  Over her whole leg and uses compression  Machine and I checked with Janese Banks and she can continue to use it.  She suggested to have ortho  Consult in regards to baker cyst and she is agreeable to it.  I got her an appt at [facility] 8/21 at 10 am . The address is [address]  I gave her the phone number.  She wanted appt made for after 8/20 because she is going on vacation. Faxed over records to [phone].

## 2017-02-02 DIAGNOSIS — S8392XA Sprain of unspecified site of left knee, initial encounter: Secondary | ICD-10-CM | POA: Diagnosis not present

## 2017-02-08 ENCOUNTER — Ambulatory Visit: Payer: PPO | Admitting: Radiation Oncology

## 2017-02-11 ENCOUNTER — Encounter: Payer: Self-pay | Admitting: Cardiovascular Disease

## 2017-02-11 ENCOUNTER — Ambulatory Visit (INDEPENDENT_AMBULATORY_CARE_PROVIDER_SITE_OTHER): Payer: PPO | Admitting: Cardiovascular Disease

## 2017-02-11 VITALS — BP 130/64 | HR 71 | Ht 61.75 in | Wt 177.5 lb

## 2017-02-11 DIAGNOSIS — I2510 Atherosclerotic heart disease of native coronary artery without angina pectoris: Secondary | ICD-10-CM

## 2017-02-11 DIAGNOSIS — I10 Essential (primary) hypertension: Secondary | ICD-10-CM

## 2017-02-11 DIAGNOSIS — E7800 Pure hypercholesterolemia, unspecified: Secondary | ICD-10-CM | POA: Diagnosis not present

## 2017-02-11 NOTE — Progress Notes (Signed)
Cardiology Office Note   Date:  02/11/2017   ID:  Jaime Hall, DOB [birth year], MRN [id number]  PCP:  [name], MD  Cardiologist:   [name], MD   Chief Complaint  Patient presents with  . other    6 month follow up. Meds reviewed by the pt. verbally. "doing well."      History of Present Illness: Jaime Hall is a 66 y.o. female who presents for  a followup visit regarding coronary artery disease.  She has history of Hodgkin's lymphoma treated with radiation and chemotherapy to the chest area and has been in remission since 2011. She does have pulmonary nodules which are being followed regularly every 6 months. She is a former smoker.  No cardiac events since then.   A treadmill nuclear stress test in September of 2014 showed a fixed anterior wall defect with minor reversibility. Ejection fraction was normal. She was diagnosed with uterine cancer this year and underwent surgery, radiation therapy and chemotherapy. She has been doing well and denies chest pain or worsening dyspnea. She did have low blood pressure and dizziness when she was treated with Cymbalta for peripheral neuropathy. Symptoms resolved after stopping the medication.  Past Medical History:  Diagnosis Date  . Anxiety   . Cervicalgia   . Coronary artery disease    a. 02/2012 Stress echo: severe anterior wall ischemia;  b. 02/2012 Cath/PCI: LAD 95p (3.0 x 15 Xience EX DES), D1 90ost (PTCA - bifurcational dzs), EF 45% with anterior HK;  b. 02/2013 Ex MV: fixed anterior defect w/ minor reversibility, nl EF-->Med Rx.  . Endometrial cancer ([place])    a. 07/2016 s/p robotic hysterectomy, BSO w/ washings, sentinel node inj, mapping, bx, adhesiolysis.  . Essential hypertension, benign   . Fibrocystic breast disease   . GERD (gastroesophageal reflux disease)   . Gestational hypertension   . Heart murmur   . History of anemia   . History of blood transfusion   . Hodgkin's lymphoma ([place]) 2011   a. s/p  radiation and chemo therapy  . Osteoarthritis   . Polycystic ovarian disease     Past Surgical History:  Procedure Laterality Date  . ABDOMINAL HYSTERECTOMY    . bladder sling    . CARDIAC CATHETERIZATION  02/2012   [facility] 1 stent place  . CERVICAL POLYPECTOMY    . CHOLECYSTECTOMY  1982  . COLONOSCOPY WITH PROPOFOL N/A 02/05/2015   Procedure: COLONOSCOPY WITH PROPOFOL;  Surgeon: Lucilla Lame, MD;  Location: [facility]COPY;  Service: Endoscopy;  Laterality: N/A;  . CORONARY ANGIOPLASTY  02/2012   left/right s/p balloon  . CYSTOGRAM N/A 08/17/2016   Procedure: CYSTOGRAM;  Surgeon: Hollice Espy, MD;  Location: [facility] ORS;  Service: Urology;  Laterality: N/A;  . CYSTOSCOPY N/A 08/17/2016   Procedure: CYSTOSCOPY EXAM UNDER ANESTHESIA;  Surgeon: Hollice Espy, MD;  Location: [facility] ORS;  Service: Urology;  Laterality: N/A;  . CYSTOSCOPY W/ RETROGRADES Bilateral 08/17/2016   Procedure: CYSTOSCOPY WITH RETROGRADE PYELOGRAM;  Surgeon: Hollice Espy, MD;  Location: [facility] ORS;  Service: Urology;  Laterality: Bilateral;  . CYSTOSCOPY WITH STENT PLACEMENT Right 08/17/2016   Procedure: CYSTOSCOPY WITH STENT PLACEMENT;  Surgeon: Hollice Espy, MD;  Location: [facility] OR[facility];  Service: Urology;  Laterality: Right;  . heart stent'  2013  . kidney stent Right 2018  . LYMPH NODE BIOPSY  2011   diagnosis of hodgkins lymphoma  . PELVIC LYMPH NODE DISSECTION N/A 07/29/2016   Procedure: PELVIC/AORTIC LYMPH NODE  SAMPLING;  Surgeon: [name], MD;  Location: [facility] ORS;  Service: Gynecology;  Laterality: N/A;  . PORTA CATH INSERTION N/A 09/22/2016   Procedure: Glori Luis Cath Insertion;  Surgeon: [name], MD;  Location: [place] [facility];  Service: Cardiovascular;  Laterality: N/A;  . PORTA CATH REMOVAL N/A 11/17/2016   Procedure: Glori Luis Cath Removal;  Surgeon: [name], MD;  Location: [place] [facility];  Service: Cardiovascular;  Laterality: N/A;  . ROBOTIC ASSISTED TOTAL HYSTERECTOMY WITH BILATERAL  SALPINGO OOPHERECTOMY N/A 07/29/2016   Procedure: ROBOTIC ASSISTED TOTAL HYSTERECTOMY WITH BILATERAL SALPINGO OOPHORECTOMY;  Surgeon: [name], MD;  Location: [facility];  Service: Gynecology;  Laterality: N/A;  . SENTINEL NODE BIOPSY N/A 07/29/2016   Procedure: SENTINEL NODE BIOPSY;  Surgeon: [name], MD;  Location: [facility];  Service: Gynecology;  Laterality: N/A;  . transobturator sling N/A 2009   [place]     Current Outpatient Prescriptions  Medication Sig Dispense Refill  . acetaminophen (TYLENOL) 500 MG tablet Take 1,000 mg by mouth every 6 (six) hours as needed for mild pain.     Marland Kitchen aspirin EC 81 MG tablet Take 81 mg by mouth at bedtime.    Marland Kitchen atorvastatin (LIPITOR) 10 MG tablet TAKE ONE TABLET BY MOUTH ONCE DAILY (Patient taking differently: Take 10 mg by mouth at bedtime. ) 90 tablet 3  . Cholecalciferol (VITAMIN D3) 2000 units TABS Take 2,000 Units by mouth 3 (three) times a week.    . diazepam (VALIUM) 5 MG tablet Take 1 tablet (5 mg total) by mouth every 12 (twelve) hours as needed for anxiety. 30 tablet 0  . esomeprazole (NEXIUM) 20 MG capsule Take 20 mg by mouth daily before breakfast.     . ibuprofen (ADVIL,MOTRIN) 200 MG tablet Take 200 mg by mouth every 6 (six) hours as needed for mild pain.     . meloxicam (MOBIC) 15 MG tablet Mobic 15 mg tablet  Take 1 tablet every day by oral route with meals.    . metoprolol tartrate (LOPRESSOR) 25 MG tablet TAKE ONE TABLET BY MOUTH TWICE DAILY 180 tablet 2  . naproxen sodium (ANAPROX) 220 MG tablet Take 440 mg by mouth 2 (two) times daily as needed (for pain/headache.).    Marland Kitchen nitroGLYCERIN (NITROSTAT) 0.4 MG SL tablet Place 1 tablet (0.4 mg total) under the tongue every 5 (five) minutes as needed for chest pain. 25 tablet 3  . Turmeric 500 MG CAPS Take 500 mg by mouth 2 (two) times daily.     No current facility-administered medications for this visit.     Allergies:   Adhesive [tape]; Z-pak [azithromycin];  Antifungal [miconazole nitrate]; and Sulfa antibiotics    Social History:  The patient  reports that she quit smoking about 14 years ago. Her smoking use included Cigarettes. She has a 30.00 pack-year smoking history. She has never used smokeless tobacco. She reports that she drinks alcohol. She reports that she does not use drugs.   Family History:  The patient's family history includes ALS in her father; Breast cancer in her maternal aunt; Cancer in her maternal aunt and maternal grandfather; Diabetes in her brother; Polymyositis in her father; Stroke in her maternal grandfather and maternal grandmother.    ROS:  Please see the history of present illness.   Otherwise, review of systems are positive for none.   All other systems are reviewed and negative.    PHYSICAL EXAM: VS:  BP 130/64 (BP Location: Left  Arm, Patient Position: Sitting, Cuff Size: Normal)   Pulse 71   Ht 5' 1.75" (1.568 m)   Wt 177 lb 8 oz (80.5 kg)   BMI 32.73 kg/m  , BMI Body mass index is 32.73 kg/m. GEN: Well nourished, well developed, in no acute distress  HEENT: normal  Neck: no JVD, carotid bruits, or masses Cardiac: RRR; no murmurs, rubs, or gallops,no edema  Respiratory:  clear to auscultation bilaterally, normal work of breathing GI: soft, nontender, nondistended, + BS MS: no deformity or atrophy  Skin: warm and dry, no rash Neuro:  Strength and sensation are intact Psych: euthymic mood, full affect   EKG:  EKG is ordered today. The ekg ordered today demonstrates normal sinus rhythm with no significant ST or T wave changes.   Recent Labs: 05/26/2016: TSH 3.92 08/05/2016: B Natriuretic Peptide 667.0 12/10/2016: ALT 18; BUN 16; Creatinine, Ser 1.02; Potassium 4.1; Sodium 140 01/13/2017: Hemoglobin 12.1; Platelets 186    Lipid Panel    Component Value Date/Time   CHOL 131 05/26/2016 1639   CHOL 110 04/11/2012 0835   TRIG 134.0 05/26/2016 1639   HDL 53.90 05/26/2016 1639   HDL 56 04/11/2012 0835     CHOLHDL 2 05/26/2016 1639   VLDL 26.8 05/26/2016 1639   LDLCALC 51 05/26/2016 1639   LDLCALC 41 04/11/2012 0835      Wt Readings from Last 3 Encounters:  02/11/17 177 lb 8 oz (80.5 kg)  01/12/17 171 lb 6.4 oz (77.7 kg)  12/23/16 173 lb 4.8 oz (78.6 kg)        ASSESSMENT AND PLAN:  1.  Coronary artery disease involving native coronary arteries without angina. She is overall doing well with no anginal symptoms. Continue medical therapy.  2.  Hyperlipidemia: Continue treatment with low dose atorvastatin. Most recent LDL was 51.  3. Hypertension: Blood pressure is controlled on metoprolol.    Disposition:   FU with me in 1 year  Signed,  [name], MD  02/11/2017 3:01 PM    [place]

## 2017-02-11 NOTE — Patient Instructions (Signed)
Medication Instructions: Continue same medications.   Labwork: None.   Procedures/Testing: None.   Follow-Up: 1 year with Dr. Arida.   Any Additional Special Instructions Will Be Listed Below (If Applicable).     If you need a refill on your cardiac medications before your next appointment, please call your pharmacy.   

## 2017-02-22 ENCOUNTER — Encounter: Payer: Self-pay | Admitting: Radiation Oncology

## 2017-02-22 ENCOUNTER — Ambulatory Visit
Admission: RE | Admit: 2017-02-22 | Discharge: 2017-02-22 | Disposition: A | Discharge status: Home / Self Care | Payer: PPO | Source: Ambulatory Visit | Attending: Radiation Oncology | Admitting: Radiation Oncology

## 2017-02-22 VITALS — BP 120/72 | HR 72 | Temp 96.3°F | Resp 20 | Wt 175.8 lb

## 2017-02-22 DIAGNOSIS — Z90722 Acquired absence of ovaries, bilateral: Secondary | ICD-10-CM | POA: Insufficient documentation

## 2017-02-22 DIAGNOSIS — C541 Malignant neoplasm of endometrium: Secondary | ICD-10-CM | POA: Diagnosis not present

## 2017-02-22 DIAGNOSIS — Z90710 Acquired absence of both cervix and uterus: Secondary | ICD-10-CM | POA: Diagnosis not present

## 2017-02-22 DIAGNOSIS — Z923 Personal history of irradiation: Secondary | ICD-10-CM | POA: Insufficient documentation

## 2017-02-22 DIAGNOSIS — Z9221 Personal history of antineoplastic chemotherapy: Secondary | ICD-10-CM | POA: Diagnosis not present

## 2017-02-22 NOTE — Progress Notes (Signed)
Radiation Oncology Follow up Note  Name: Jaime Hall   Date:   02/22/2017 MRN:  [id number] DOB: 1951-05-09    This 66 y.o. female presents to the clinic today for one-month follow-up. 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 She is seen today in routine follow-up is doing well. She specifically denies diarrhea dysuria or any increased lower urinary tract symptoms..  COMPLICATIONS OF TREATMENT: none  FOLLOW UP COMPLIANCE: keeps appointments   PHYSICAL EXAM:  BP 120/72   Pulse 72   Temp (!) 96.3 F (35.7 C)   Resp 20   Wt 175 lb 13.1 oz (79.8 kg)   BMI 32.42 kg/m  Well-developed well-nourished patient in NAD. HEENT reveals PERLA, EOMI, discs not visualized.  Oral cavity is clear. No oral mucosal lesions are identified. Neck is clear without evidence of cervical or supraclavicular adenopathy. Lungs are clear to A&P. Cardiac examination is essentially unremarkable with regular rate and rhythm without murmur rub or thrill. Abdomen is benign with no organomegaly or masses noted. Motor sensory and DTR levels are equal and symmetric in the upper and lower extremities. Cranial nerves II through XII are grossly intact. Proprioception is intact. No peripheral adenopathy or edema is identified. No motor or sensory levels are noted. Crude visual fields are within normal range.  RADIOLOGY RESULTS: No current films for review  PLAN: Present time patient is doing well 1 month out from external beam whole pelvic radiation. She has very little side effects or complaints. She is to see GYN oncology next month and will have a pelvic exam  performed at that time. I have asked to see her back in 4-5 months for follow-up and will start 6 month follow-up visit. Patient is to call with any concerns.  I would like to take this opportunity to thank you for allowing me to participate in the care of your patient.Armstead Peaks., MD

## 2017-03-11 ENCOUNTER — Inpatient Hospital Stay (HOSPITAL_BASED_OUTPATIENT_CLINIC_OR_DEPARTMENT_OTHER): Payer: PPO | Admitting: Oncology

## 2017-03-11 ENCOUNTER — Inpatient Hospital Stay: Payer: PPO | Attending: Oncology

## 2017-03-11 ENCOUNTER — Inpatient Hospital Stay: Payer: PPO

## 2017-03-11 VITALS — BP 115/81 | HR 89 | Temp 97.7°F | Resp 14 | Wt 175.5 lb

## 2017-03-11 DIAGNOSIS — Z90710 Acquired absence of both cervix and uterus: Secondary | ICD-10-CM | POA: Insufficient documentation

## 2017-03-11 DIAGNOSIS — Z923 Personal history of irradiation: Secondary | ICD-10-CM | POA: Insufficient documentation

## 2017-03-11 DIAGNOSIS — M1990 Unspecified osteoarthritis, unspecified site: Secondary | ICD-10-CM | POA: Diagnosis not present

## 2017-03-11 DIAGNOSIS — Z87891 Personal history of nicotine dependence: Secondary | ICD-10-CM | POA: Insufficient documentation

## 2017-03-11 DIAGNOSIS — M542 Cervicalgia: Secondary | ICD-10-CM | POA: Insufficient documentation

## 2017-03-11 DIAGNOSIS — N6019 Diffuse cystic mastopathy of unspecified breast: Secondary | ICD-10-CM | POA: Diagnosis not present

## 2017-03-11 DIAGNOSIS — T451X5A Adverse effect of antineoplastic and immunosuppressive drugs, initial encounter: Secondary | ICD-10-CM

## 2017-03-11 DIAGNOSIS — N736 Female pelvic peritoneal adhesions (postinfective): Secondary | ICD-10-CM | POA: Insufficient documentation

## 2017-03-11 DIAGNOSIS — I10 Essential (primary) hypertension: Secondary | ICD-10-CM | POA: Diagnosis not present

## 2017-03-11 DIAGNOSIS — C541 Malignant neoplasm of endometrium: Secondary | ICD-10-CM

## 2017-03-11 DIAGNOSIS — Z7982 Long term (current) use of aspirin: Secondary | ICD-10-CM

## 2017-03-11 DIAGNOSIS — Z90722 Acquired absence of ovaries, bilateral: Secondary | ICD-10-CM

## 2017-03-11 DIAGNOSIS — Z79899 Other long term (current) drug therapy: Secondary | ICD-10-CM

## 2017-03-11 DIAGNOSIS — F419 Anxiety disorder, unspecified: Secondary | ICD-10-CM | POA: Insufficient documentation

## 2017-03-11 DIAGNOSIS — G620 Drug-induced polyneuropathy: Secondary | ICD-10-CM | POA: Insufficient documentation

## 2017-03-11 DIAGNOSIS — I890 Lymphedema, not elsewhere classified: Secondary | ICD-10-CM | POA: Insufficient documentation

## 2017-03-11 DIAGNOSIS — K219 Gastro-esophageal reflux disease without esophagitis: Secondary | ICD-10-CM

## 2017-03-11 DIAGNOSIS — E282 Polycystic ovarian syndrome: Secondary | ICD-10-CM | POA: Diagnosis not present

## 2017-03-11 DIAGNOSIS — Z9221 Personal history of antineoplastic chemotherapy: Secondary | ICD-10-CM

## 2017-03-11 DIAGNOSIS — Z8572 Personal history of non-Hodgkin lymphomas: Secondary | ICD-10-CM | POA: Insufficient documentation

## 2017-03-11 DIAGNOSIS — I2510 Atherosclerotic heart disease of native coronary artery without angina pectoris: Secondary | ICD-10-CM | POA: Diagnosis not present

## 2017-03-11 DIAGNOSIS — R5383 Other fatigue: Secondary | ICD-10-CM | POA: Diagnosis not present

## 2017-03-11 LAB — COMPREHENSIVE METABOLIC PANEL
ALT: 17 U/L (ref 14–54)
AST: 25 U/L (ref 15–41)
Albumin: 4.1 g/dL (ref 3.5–5.0)
Alkaline Phosphatase: 69 U/L (ref 38–126)
Anion gap: 6 (ref 5–15)
BUN: 23 mg/dL — ABNORMAL HIGH (ref 6–20)
CO2: 25 mmol/L (ref 22–32)
Calcium: 9.2 mg/dL (ref 8.9–10.3)
Chloride: 109 mmol/L (ref 101–111)
Creatinine, Ser: 1.23 mg/dL — ABNORMAL HIGH (ref 0.44–1.00)
GFR calc Af Amer: 51 mL/min — ABNORMAL LOW (ref >60)
GFR calc non Af Amer: 44 mL/min — ABNORMAL LOW (ref >60)
Glucose, Bld: 99 mg/dL (ref 65–99)
Potassium: 4.2 mmol/L (ref 3.5–5.1)
Sodium: 140 mmol/L (ref 135–145)
Total Bilirubin: 1.1 mg/dL (ref 0.3–1.2)
Total Protein: 6.9 g/dL (ref 6.5–8.1)

## 2017-03-11 LAB — CBC WITH DIFFERENTIAL/PLATELET
Basophils Absolute: 0 10*3/uL (ref 0–0.1)
Basophils Relative: 1 %
Eosinophils Absolute: 0.2 10*3/uL (ref 0–0.7)
Eosinophils Relative: 4 %
HCT: 32.7 % — ABNORMAL LOW (ref 35.0–47.0)
Hemoglobin: 11.4 g/dL — ABNORMAL LOW (ref 12.0–16.0)
Lymphocytes Relative: 16 %
Lymphs Abs: 0.7 10*3/uL — ABNORMAL LOW (ref 1.0–3.6)
MCH: 32.1 pg (ref 26.0–34.0)
MCHC: 34.9 g/dL (ref 32.0–36.0)
MCV: 91.9 fL (ref 80.0–100.0)
Monocytes Absolute: 0.4 10*3/uL (ref 0.2–0.9)
Monocytes Relative: 9 %
Neutro Abs: 2.9 10*3/uL (ref 1.4–6.5)
Neutrophils Relative %: 70 %
Platelets: 219 10*3/uL (ref 150–440)
RBC: 3.55 MIL/uL — ABNORMAL LOW (ref 3.80–5.20)
RDW: 13.7 % (ref 11.5–14.5)
WBC: 4.1 10*3/uL (ref 3.6–11.0)

## 2017-03-11 NOTE — Progress Notes (Signed)
Patient reports swelling in her left leg that started in mid July during her radiation treatments. She also states that the bottom of her feet are still burning from her chemo treatments.

## 2017-03-11 NOTE — Progress Notes (Signed)
Survivorship Care Plan visit completed.  Treatment summary reviewed and given to patient.  ASCO answers booklet reviewed and given to patient.  CARE program and Cancer Transitions discussed with patient along with other resources cancer center offers to patients and caregivers.  Patient verbalized understanding.    

## 2017-03-11 NOTE — Progress Notes (Signed)
Hematology/Oncology Consult note [facility]  [phone] [phone]  Patient Care Team: [name], MD as PCP - General (Internal Medicine) [name], MD as Consulting Physician (General Surgery) Mellody Drown, MD as Referring Physician (Obstetrics and Gynecology) [name], MD as Referring Physician (Obstetrics and Gynecology) Hollice Espy, MD as Consulting Physician (Urology) [name], RN as Registered Nurse Sindy Guadeloupe, MD as Consulting Physician (Oncology)   Name of the patient: Jaime Hall  [id number]  [birth year]   Date of visit: 03/11/17  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 [name] grade 1 endometrial cancer when she presented with postmenopausal bleeding in February 2018. She underwent robotic hysterectomy and bilateral salpingo-oophorectomy with washings and sentinel lymph node injection and mapping and biopsy on 07/29/2016. She was found to have extensive pelvic adhesive disease and underwent elevated pelvic adhesiolysis including enteric and ovariolysis. The cul de sac was obliterated and adherent to posterior cervix.-was negative.  2. Pathology showed: Pathology DIAGNOSIS:  A. SENTINEL LYMPH NODE, RIGHT MID OBTURATOR; EXCISION:  - NO TUMOR SEEN IN ONE LYMPH NODE (0/1).   B. SENTINEL LYMPH NODE, RIGHT EXTERNAL ILIAC; EXCISION:  - NO TUMOR SEEN IN ONE LYMPH NODE (0/1).   C. SENTINEL LYMPH NODE, LEFT EXTERNAL ILIAC; EXCISION:  - NO TUMOR SEEN IN ONE LYMPH NODE (0/1).   D. UTERUS WITH CERVIX, BILATERAL FALLOPIAN TUBES AND OVARIES;  HYSTERECTOMY AND BILATERAL SALPINGO-OOPHORECTOMY:  - SEROUS CARCINOMA, 4.5 CM(SEE NOTE).  - LYMPHOVASCULAR INVASION PRESENT.  - NO TUMOR SEEN IN BILATERAL OVARIES AND FALLOPIAN  TUBES.    ENDOMETRIUM:  Procedure: Hysterectomy with bilateral salpingo-oophorectomy  Histologic Type: Serous carcinoma  Myometrial Invasion: Present  Depth of invasion (millimeters): 13 mm  Myometrial thickness (millimeters): 14 mm  Percentage of myometrial invasion: 93%  Uterine Serosa Involvement: Not identified  Cervical Stromal Involvement: Not identified   BIOMARKER REPORTING TEMPLATE:  p53 Expression: Abnormal strong diffuse overexpression (>90%)  ER/PR negative. Her2 pending MS stable  3. She presented with fever on 08/05/2016 and CT scan showed 1.8 x 3.8 cm complex fluid collection containing pockets of air within somewhat organized walls within the pelvis and the hysterectomy bed most consistent with a developing abscess. There is a superior extension of the loculated fluid along the left lateral pelvic wall anterior to the left psoas muscle measuring 2 x 4 cm and another fluid collection along the anterior surface of the left psoas muscle measuring 1.8 x 3.6 cm. Blood cultures were positive for bacteria growing his fragilis. Urine cultures were negative. Patient was treated with IV antibiotics and discharged on Augmentin.  4. 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 [name] from urology on 08/14/2016. No leak was seen on CT urogram.  5. On 08/17/2016 patient underwent Exam under anesthesia, vaginoscopy, cystoscopy, cystogram with interpretation of fluoroscopy less than 30 minutes, bilateral retrograde pyelogram, and right ureteral stent placement. Intraoperative findings: No obvious ureterovaginal vesicovaginal fistula appreciated. Mild right hydroureteronephrosis down to level of the distal ureter with slight medial deviation without filling defects. Slight mucopurulent drainage from left apical vaginal cuff,  specimen cultured. Culture revealed bateroides fragilis and she was started on a 2 week course of Flagyl iniated on 08/24/2016 which  she has now completed  6. Repeat CT abdomen on 09/10/2016 IMPRESSION:    1. Interval resolution of abscess involving the vaginal cuff.    2. The proximal end of the right ureteral stent is coiled in the proximal right ureter. No hydronephrosis.    3. No evidence of metastatic disease or other significant changes.    4. Aortic atherosclerosis   7. Patient has been recovering slowly following her complicated postoperative course without fever or vaginal leakage. She did suffer from fatigue. She remained independent in her ADLs. She has a prior history of Hodgkin's lymphoma in 2011 treated with anthracycline-based chemotherapy  8. There was concern about port infection and was looked at by vascular surgery. They did not think it was infected and recommend conservative management.   9. Patient completed 3 cycles of carbo/taxol chemotherapy 11/12/16. Taxol was dose reduced by 25% during dose 3 due to neuropathy.   10. Patient completed 5 weeks of external beam whole pelvic radiation 01/19/17.   Interval history- continues to have neuropathy in the soles of her feet which cause discomfort and limit her mobility. Denies falls or injury. Cymbalta did provide relief but was stopped due to multiple episodes of hypotension which have ceased since discontinuation.  She has had continued nocturnal swelling of left leg. Mild swelling today. Previous ultrasound r/o clot and pt reports cards did not feel was cardiac etiology. Pt feels is lymphedema . She has used pneumatic compression boots at night with some relief. She sees gyn onc in 2 weeks.  ECOG PS- 1 Pain scale- 0  Review of systems- Review of Systems  Constitutional: Negative for chills, fever, malaise/fatigue and weight loss.  HENT: Negative for congestion, ear discharge and nosebleeds.   Eyes: Negative for blurred  vision.  Respiratory: Negative for cough, hemoptysis, sputum production, shortness of breath and wheezing.   Cardiovascular: Positive for leg swelling (left leg). Negative for chest pain, palpitations, orthopnea and claudication.  Gastrointestinal: Negative for abdominal pain, blood in stool, constipation, diarrhea, heartburn, melena, nausea and vomiting.  Genitourinary: Negative for dysuria, flank pain, frequency, hematuria and urgency.  Musculoskeletal: Negative for back pain, joint pain and myalgias.  Skin: Negative for rash.  Neurological: Positive for tingling and sensory change. Negative for dizziness, focal weakness, seizures, weakness and headaches.  Endo/Heme/Allergies: Does not bruise/bleed easily.  Psychiatric/Behavioral: Negative for depression and suicidal ideas. The patient does not have insomnia.      Allergies  Allergen Reactions  . Adhesive [Tape] Other (See Comments)    Burns the skin  . Z-Pak [Azithromycin] Itching  . Antifungal [Miconazole Nitrate] Rash  . Sulfa Antibiotics Nausea And Vomiting and Rash    N/T     Past Medical History:  Diagnosis Date  . Anxiety   . Cervicalgia   . Coronary artery disease    a. 02/2012 Stress echo: severe anterior wall ischemia;  b. 02/2012 Cath/PCI: LAD 95p (3.0 x 15 Xience EX DES), D1 90ost (PTCA - bifurcational dzs), EF 45% with anterior HK;  b. 02/2013 Ex MV: fixed anterior defect w/ minor reversibility, nl EF-->Med Rx.  . Endometrial cancer ([place])    a. 07/2016 s/p robotic hysterectomy, BSO w/ washings, sentinel node inj, mapping, bx, adhesiolysis.  . Essential hypertension, benign   . Fibrocystic breast disease   . GERD (gastroesophageal reflux disease)   . Gestational hypertension   . Heart murmur   . History of anemia   . History of blood transfusion   . Hodgkin's lymphoma ([place]) 2011  a. s/p radiation and chemo therapy  . Osteoarthritis   . Polycystic ovarian disease      Past Surgical History:  Procedure  Laterality Date  . ABDOMINAL HYSTERECTOMY    . bladder sling    . CARDIAC CATHETERIZATION  02/2012   [facility] 1 stent place  . CERVICAL POLYPECTOMY    . CHOLECYSTECTOMY  1982  . COLONOSCOPY WITH PROPOFOL N/A 02/05/2015   Procedure: COLONOSCOPY WITH PROPOFOL;  Surgeon: Lucilla Lame, MD;  Location: [facility] ENDOSCOPY;  Service: Endoscopy;  Laterality: N/A;  . CORONARY ANGIOPLASTY  02/2012   left/right s/p balloon  . CYSTOGRAM N/A 08/17/2016   Procedure: CYSTOGRAM;  Surgeon: Hollice Espy, MD;  Location: [facility] ORS;  Service: Urology;  Laterality: N/A;  . CYSTOSCOPY N/A 08/17/2016   Procedure: CYSTOSCOPY EXAM UNDER ANESTHESIA;  Surgeon: Hollice Espy, MD;  Location: [facility] ORS;  Service: Urology;  Laterality: N/A;  . CYSTOSCOPY W/ RETROGRADES Bilateral 08/17/2016   Procedure: CYSTOSCOPY WITH RETROGRADE PYELOGRAM;  Surgeon: Hollice Espy, MD;  Location: [facility] ORS;  Service: Urology;  Laterality: Bilateral;  . CYSTOSCOPY WITH STENT PLACEMENT Right 08/17/2016   Procedure: CYSTOSCOPY WITH STENT PLACEMENT;  Surgeon: Hollice Espy, MD;  Location: [facility] ORS;  Service: Urology;  Laterality: Right;  . heart stent'  2013  . kidney stent Right 2018  . LYMPH NODE BIOPSY  2011   diagnosis of hodgkins lymphoma  . PELVIC LYMPH NODE DISSECTION N/A 07/29/2016   Procedure: PELVIC/AORTIC LYMPH NODE SAMPLING;  Surgeon: [name], MD;  Location: [facility] ORS;  Service: Gynecology;  Laterality: N/A;  . PORTA CATH INSERTION N/A 09/22/2016   Procedure: Glori Luis Cath Insertion;  Surgeon: [name], MD;  Location: [facility];  Service: Cardiovascular;  Laterality: N/A;  . PORTA CATH REMOVAL N/A 11/17/2016   Procedure: Glori Luis Cath Removal;  Surgeon: [name], MD;  Location: [facility];  Service: Cardiovascular;  Laterality: N/A;  . ROBOTIC ASSISTED TOTAL HYSTERECTOMY WITH BILATERAL SALPINGO OOPHERECTOMY N/A 07/29/2016   Procedure: ROBOTIC ASSISTED TOTAL HYSTERECTOMY WITH BILATERAL SALPINGO  OOPHORECTOMY;  Surgeon: [name], MD;  Location: [facility] OR[facility];  Service: Gynecology;  Laterality: N/A;  . SENTINEL NODE BIOPSY N/A 07/29/2016   Procedure: SENTINEL NODE BIOPSY;  Surgeon: [name], MD;  Location: [facility] OR[facility];  Service: Gynecology;  Laterality: N/A;  . transobturator sling N/A 2009   [place] History   Social History  . Marital status: Widowed    Spouse name: N/A  . Number of children: N/A  . Years of education: N/A   Occupational History  . Not on file.   Social History Main Topics  . Smoking status: Former Smoker    Packs/day: 1.00    Years: 30.00    Types: Cigarettes    Quit date: 07/24/2002  . Smokeless tobacco: Never Used     Comment: quit smoking in 2000  . Alcohol use Yes     Comment: occasional, 1-2 drinks per month  . Drug use: No  . Sexual activity: No   Other Topics Concern  . Not on file   Social History Narrative  . No narrative on file    Family History  Problem Relation Age of Onset  . ALS Father   . Polymyositis Father   . Diabetes Brother   . Cancer Maternal Aunt        breast  . Breast cancer Maternal Aunt        30's  . Stroke Maternal  Grandmother   . Cancer Maternal Grandfather        prostate  . Stroke Maternal Grandfather      Current Outpatient Prescriptions:  .  acetaminophen (TYLENOL) 500 MG tablet, Take 1,000 mg by mouth every 6 (six) hours as needed for mild pain. , Disp: , Rfl:  .  aspirin EC 81 MG tablet, Take 81 mg by mouth at bedtime., Disp: , Rfl:  .  atorvastatin (LIPITOR) 10 MG tablet, TAKE ONE TABLET BY MOUTH ONCE DAILY (Patient taking differently: Take 10 mg by mouth at bedtime. ), Disp: 90 tablet, Rfl: 3 .  Cholecalciferol (VITAMIN D3) 2000 units TABS, Take 2,000 Units by mouth 3 (three) times a week., Disp: , Rfl:  .  diazepam (VALIUM) 5 MG tablet, Take 1 tablet (5 mg total) by mouth every 12 (twelve) hours as needed for anxiety., Disp: 30 tablet, Rfl: 0 .  esomeprazole  (NEXIUM) 20 MG capsule, Take 20 mg by mouth daily before breakfast. , Disp: , Rfl:  .  ibuprofen (ADVIL,MOTRIN) 200 MG tablet, Take 200 mg by mouth every 6 (six) hours as needed for mild pain. , Disp: , Rfl:  .  metoprolol tartrate (LOPRESSOR) 25 MG tablet, TAKE ONE TABLET BY MOUTH TWICE DAILY, Disp: 180 tablet, Rfl: 2 .  naproxen sodium (ANAPROX) 220 MG tablet, Take 440 mg by mouth 2 (two) times daily as needed (for pain/headache.)., Disp: , Rfl:  .  nitroGLYCERIN (NITROSTAT) 0.4 MG SL tablet, Place 1 tablet (0.4 mg total) under the tongue every 5 (five) minutes as needed for chest pain., Disp: 25 tablet, Rfl: 3 .  Turmeric 500 MG CAPS, Take 500 mg by mouth 2 (two) times daily., Disp: , Rfl:   Physical exam:  Vitals:   03/11/17 1339  BP: 115/81  Pulse: 89  Resp: 14  Temp: 97.7 F (36.5 C)  TempSrc: Tympanic  Weight: 175 lb 8 oz (79.6 kg)   Physical Exam  Constitutional: She is oriented to person, place, and time and well-developed, well-nourished, and in no distress.  HENT:  Head: Normocephalic and atraumatic.  Eyes: Pupils are equal, round, and reactive to light. EOM are normal.  Neck: Normal range of motion.  Cardiovascular: Normal rate, regular rhythm and normal heart sounds.   Pulmonary/Chest: Effort normal and breath sounds normal.  Abdominal: Soft. Bowel sounds are normal.  Musculoskeletal: She exhibits edema (non-pitting edema of leg leg extending up to groin).  Neurological: She is alert and oriented to person, place, and time.  Skin: Skin is warm and dry.  Alopecia resolved  Psychiatric: Mood and affect normal.     CMP Latest Ref Rng & Units 03/11/2017  Glucose 65 - 99 mg/dL 99  BUN 6 - 20 mg/dL 23(H)  Creatinine 0.44 - 1.00 mg/dL 1.23(H)  Sodium 135 - 145 mmol/L 140  Potassium 3.5 - 5.1 mmol/L 4.2  Chloride 101 - 111 mmol/L 109  CO2 22 - 32 mmol/L 25  Calcium 8.9 - 10.3 mg/dL 9.2  Total Protein 6.5 - 8.1 g/dL 6.9  Total Bilirubin 0.3 - 1.2 mg/dL 1.1  Alkaline  Phos 38 - 126 U/L 69  AST 15 - 41 U/L 25  ALT 14 - 54 U/L 17   CBC Latest Ref Rng & Units 03/11/2017  WBC 3.6 - 11.0 K/uL 4.1  Hemoglobin 12.0 - 16.0 g/dL 11.4(L)  Hematocrit 35.0 - 47.0 % 32.7(L)  Platelets 150 - 440 K/uL 219      Assessment and plan- Patient is a 66  y.o. female with h/o Stage I high risk serous endometrial cancer s/p 3 cycles of carbo/taxol.  1. Chemotherapy has been completed. Port is out. She completed WBRT 01/19/17. She will follow back up with GYN oncology in October for re-evaluation. Will plan to see her back in 3 months and try to alternate visits with gyn onc.   2. Chemo induced peripehral neuropathy- patient was not able to tolerate cymbalta and has previously failed gabapentin. She is not interested in any medication at this time and would like to try acupuncture. Will refer and cautioned patient this may not be covered by insurance. She agrees to pay out of pocket.   3. Elevated creatinine- will continue to monitor. May consider nephrology referral if it continues to trend up. RTC in 3 months with cbc, cmp, ca125  4. Left leg swelling- previous ultrasounds negative for clot, positive for baker's cyst. 3 lymph nodes were removed with surgery but this does not look like lymphedema. Could consider referral to lymphedema clinic in the future. Discussed compression hose but patient does not like to wear. May be related to neuropathy. Will continue to monitor.    Visit Diagnosis 1. Endometrial cancer ([place])   2. Chemotherapy-induced peripheral neuropathy ([place])    Beckey Rutter, NP 03/11/17 4:23 PM  Dr. Randa Evens, MD, MPH [place] at [facility] Pager- [phone] 03/11/2017 2:02 PM

## 2017-03-18 ENCOUNTER — Encounter: Payer: Self-pay | Admitting: *Deleted

## 2017-03-18 NOTE — Progress Notes (Signed)
  Oncology Nurse Navigator Documentation  Navigator Location: [facility] (03/18/17 1400)   )Navigator Encounter Type: Telephone (03/18/17 1400) Telephone: [place] Call (03/18/17 1400)                   Patient Visit Type: Other (03/18/17 1400)       Interventions: Other (acupuncture) (03/18/17 1400)                      Time Spent with Patient: 30 (03/18/17 1400)   Patient referred to me by Ander Purpura, the NP for assistance with acupuncture for her neuropathy.  Patient is not eligible for free acupuncture, but can get it at a reduced rate.  Number given to [place].  She is going to bring [place] a power bill and we can assist her financially in that manner.  She is agreeable.

## 2017-03-23 NOTE — Progress Notes (Signed)
Gynecologic Oncology Interval Visit   Referring Provider: Dr. 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 G2P2 female who is seen in consultation from [name] and Garwin Brothers for endometrial cancer.  Now receiving whole pelvis radiation with Dr Baruch Gouty completed in August 2018. He saw her on 03/11/2017 for her one month follow up.  She presents today for follow up.   Gynecologic Oncology History Jaime Hall is a pleasant G102P2 female who is seen in consultation from [name] and Garwin Brothers for grade 1 endometrial cancer. See prior note for complete details.   Endometrial biopsy done 07/15/16 showed grade 1 endometrial cancer. Surgical management was recommended.   History is also significant for h/o Hodgkin's Lymphoma 2011 treated successfully at [facility] with chemo and RT.   Bladder sling for SUI 2009.  Benign cervical polyp removed 5/16. Has some leg edema due to chronic venous insufficiency.  Had angioplasty and cardiac stents in 2013 and was on Plavix for three years, now just ASA.  Surgical management was recommended.   On 07/29/2016 she underwent robotic hysterectomy, and bilateral salpingo-oophorectomy with washings as well as sentinel node injection, mapping, and biopsy; pelvic adhesiolysis including enterolysis and ovariolysis > 45 minutes. She was noted to have severe pelvic adhesive disease involving gynecologic organs and the bowel. The cul de sac was obliterated and adherent to the posterior cervix. A bubble test was performed and was negative.   Postop she presented with fevers on 08/05/2016 and was admitted. CT scan 08/05/1016 revealed small pockets of air in the posterior pelvic floor likely postsurgical. There is a 1.8 x 3.8 cm complex fluid collection containing pockets of air with somewhat organized walls within the pelvis at the hysterectomy bed most consistent with developing abscess. There  is superior extension of loculated fluid along the left lateral pelvic wall anterior to the left psoas muscle. The component of the loculated fluid along the left lateral pelvic wall measures 2.0 x 4.0 cm and fluid collection along the anterior surface of the left psoas muscle measures approximately 1.8 x 3.6 cm. Blood cultures: + bacterimia with bacteroides fragilis; urine cultures negative.   She was treated with IV antibiotics with improvement in her symptoms. She was discharged on 09/05/2015 on Augmentin.   She was seen by Dr. Leonides Schanz and complained of vaginal leakage. 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 Subsequently she underwent a double tied tests with oral Pyridium as well as backfilling the bladder with blue. 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  Fluid in her vaginal vault was also appreciated. She was referred to Dr. [name], Urology on 08/14/2016. No obvious fistula or leak appreciated on CT urogram, although there is no complete delayed imaging to assess the right-sided ureter.   CT urogram 08/13/2016 IMPRESSION: 1. Stable size of an abscess at the vaginal cuff with superior extension along the left pelvic sidewall to the posterior margin of the mid sigmoid colon. 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 2. No hydronephrosis. Normal caliber ureters. No evidence of contrast extravasation from the ureters or bladder, with limitations as described. 3. No evidence of metastatic disease in the abdomen or pelvis. 4. Aortic atherosclerosis.   On 08/17/3016 she underwent Exam under anesthesia, vaginoscopy, cystoscopy, cystogram with interpretation of fluoroscopy less than 30 minutes, bilateral retrograde pyelogram, and right ureteral stent placement.   Intraoperative findings: No obvious ureterovaginal  vesicovaginal fistula appreciated. Mild right hydroureteronephrosis down to level of the  distal ureter with slight medial deviation without filling defects. Slight mucopurulent drainage from left apical vaginal cuff, specimen cultured.  Culture revealed bateroides fragilis and she was started on a 2 week course of Flagyl iniated on 08/24/2016.  Since that time she continues to do well. She presents today to discuss pathology findings and treatment plan. She is due to have the stent removed on 09/22/2016.   Pathology DIAGNOSIS:  A. SENTINEL LYMPH NODE, RIGHT MID OBTURATOR; EXCISION:  - NO TUMOR SEEN IN ONE LYMPH NODE (0/1).   B. SENTINEL LYMPH NODE, RIGHT EXTERNAL ILIAC; EXCISION:  - NO TUMOR SEEN IN ONE LYMPH NODE (0/1).   C. SENTINEL LYMPH NODE, LEFT EXTERNAL ILIAC; EXCISION:  - NO TUMOR SEEN IN ONE LYMPH NODE (0/1).   D. UTERUS WITH CERVIX, BILATERAL FALLOPIAN TUBES AND OVARIES;  HYSTERECTOMY AND BILATERAL SALPINGO-OOPHORECTOMY:  - SEROUS CARCINOMA, 4.5 CM(SEE NOTE).  - LYMPHOVASCULAR INVASION PRESENT.  - NO TUMOR SEEN IN BILATERAL OVARIES AND FALLOPIAN TUBES.  ENDOMETRIUM:  Procedure: Hysterectomy with bilateral salpingo-oophorectomy  Histologic Type: Serous carcinoma  Myometrial Invasion: Present       Depth of invasion (millimeters): 13 mm  Myometrial thickness (millimeters): 14 mm       Percentage of myometrial invasion: 93%  Uterine Serosa Involvement: Not identified  Cervical Stromal Involvement: Not identified    BIOMARKER REPORTING TEMPLATE:  p53 Expression: Abnormal strong diffuse overexpression (>90%)  ER/PR negative.  MS stable Her2neu assessment was performed given serous histology and was negative.   CT scan on 09/10/2016 with findings that are very reassuring so stent removed. 1. Interval resolution of abscess involving the vaginal cuff. 2. The proximal end of the right ureteral stent is coiled in the proximal right ureter. No hydronephrosis. 3. No evidence of metastatic disease or other significant changes. 4. Aortic  atherosclerosis.   She was referred to Dr. Janese Banks for adjuvant chemotherapy and completed 3 cycles of carbo/taxol. Notable side effects include neuropathy in hands due to prior chemo in past for Hodgkin's disease and more recent regimen.  Taxol dose was reduced 25% with cycle 3 due to neuropathy.  She is considering acupuncture for relief.   Problem List: Patient Active Problem List   Diagnosis Date Noted  . Cerumen impaction 01/12/2017  . Chemotherapy-induced peripheral neuropathy ([place]) 12/10/2016  . 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 SIRS (systemic inflammatory response syndrome) ([place]) 08/05/2016  . Endometrial cancer ([place])   . Pelvic adhesive disease   . Lymphedema 07/20/2016  . Chronic venous insufficiency 07/20/2016  . PAD (peripheral artery disease) ([place]) 07/20/2016  . Postmenopausal bleeding 07/14/2016  . Influenza 07/14/2016  . Class 1 obesity due to excess calories without serious comorbidity with body mass index (BMI) of 34.0 to 34.9 in adult 05/27/2016  . Chronic cough 12/19/2015  . Pre-syncope 12/19/2015  . Hx of colonic polyps   . Benign neoplasm of sigmoid colon   . Dysphagia, pharyngoesophageal phase 10/01/2014  . Prolapsed, uterovaginal, incomplete 06/24/2014  . Routine general medical examination at a health care facility 12/30/2012  . Obesity (BMI 30-39.9) 12/30/2012  . Hx of multiple pulmonary nodules 12/28/2012  . Plantar fasciitis of right foot 12/28/2012  . Neuropathy associated with lymphoma ([place]) 09/12/2012  . Edema of both legs 09/12/2012  . Hip pain, bilateral 09/12/2012  . History of Hodgkin's lymphoma 09/12/2012  . Coronary artery disease   . Hyperlipidemia   . Chest pain on exertion 02/29/2012  Past Medical History: Past Medical History:  Diagnosis Date  . Anxiety   . Cervicalgia   . Coronary artery disease    a. 02/2012 Stress echo: severe anterior wall ischemia;  b. 02/2012 Cath/PCI: LAD 95p (3.0 x 15 Xience EX DES), D1 90ost (PTCA -  bifurcational dzs), EF 45% with anterior HK;  b. 02/2013 Ex MV: fixed anterior defect w/ minor reversibility, nl EF-->Med Rx.  . Endometrial cancer ([place])    a. 07/2016 s/p robotic hysterectomy, BSO w/ washings, sentinel node inj, mapping, bx, adhesiolysis.  . Essential hypertension, benign   . Fibrocystic breast disease   . GERD (gastroesophageal reflux disease)   . Gestational hypertension   . Heart murmur   . History of anemia   . History of blood transfusion   . Hodgkin's lymphoma ([place]) 2011   a. s/p radiation and chemo therapy  . Osteoarthritis   . Polycystic ovarian disease     Past Surgical History: Past Surgical History:  Procedure Laterality Date  . ABDOMINAL HYSTERECTOMY    . bladder sling    . CARDIAC CATHETERIZATION  02/2012   [facility] 1 stent place  . CERVICAL POLYPECTOMY    . CHOLECYSTECTOMY  1982  . COLONOSCOPY WITH PROPOFOL N/A 02/05/2015   Procedure: COLONOSCOPY WITH PROPOFOL;  Surgeon: Lucilla Lame, MD;  Location: [facility] ENDOSCOPY;  Service: Endoscopy;  Laterality: N/A;  . CORONARY ANGIOPLASTY  02/2012   left/right s/p balloon  . CYSTOGRAM N/A 08/17/2016   Procedure: CYSTOGRAM;  Surgeon: Hollice Espy, MD;  Location: [facility] ORS;  Service: Urology;  Laterality: N/A;  . CYSTOSCOPY N/A 08/17/2016   Procedure: CYSTOSCOPY EXAM UNDER ANESTHESIA;  Surgeon: Hollice Espy, MD;  Location: [facility] ORS;  Service: Urology;  Laterality: N/A;  . CYSTOSCOPY W/ RETROGRADES Bilateral 08/17/2016   Procedure: CYSTOSCOPY WITH RETROGRADE PYELOGRAM;  Surgeon: Hollice Espy, MD;  Location: [facility] ORS;  Service: Urology;  Laterality: Bilateral;  . CYSTOSCOPY WITH STENT PLACEMENT Right 08/17/2016   Procedure: CYSTOSCOPY WITH STENT PLACEMENT;  Surgeon: Hollice Espy, MD;  Location: [facility] ORS;  Service: Urology;  Laterality: Right;  . heart stent'  2013  . kidney stent Right 2018  . LYMPH NODE BIOPSY  2011   diagnosis of hodgkins lymphoma  . PELVIC LYMPH NODE DISSECTION N/A 07/29/2016   Procedure:  PELVIC/AORTIC LYMPH NODE SAMPLING;  Surgeon: [name], MD;  Location: [facility] ORS;  Service: Gynecology;  Laterality: N/A;  . PORTA CATH INSERTION N/A 09/22/2016   Procedure: Glori Luis Cath Insertion;  Surgeon: [name], MD;  Location: [place] CV LAB;  Service: Cardiovascular;  Laterality: N/A;  . PORTA CATH REMOVAL N/A 11/17/2016   Procedure: Glori Luis Cath Removal;  Surgeon: [name], MD;  Location: [employer] [facility] CV LAB;  Service: Cardiovascular;  Laterality: N/A;  . ROBOTIC ASSISTED TOTAL HYSTERECTOMY WITH BILATERAL SALPINGO OOPHERECTOMY N/A 07/29/2016   Procedure: ROBOTIC ASSISTED TOTAL HYSTERECTOMY WITH BILATERAL SALPINGO OOPHORECTOMY;  Surgeon: [name], MD;  Location: [facility] ORS;  Service: Gynecology;  Laterality: N/A;  . SENTINEL NODE BIOPSY N/A 07/29/2016   Procedure: SENTINEL NODE BIOPSY;  Surgeon: [name], MD;  Location: [facility] ORS;  Service: Gynecology;  Laterality: N/A;  . transobturator sling N/A 2009   [place]    Family History: Family History  Problem Relation Age of Onset  . ALS Father   . Polymyositis Father   . Diabetes Brother   . Cancer Maternal Aunt        breast  . Breast cancer Maternal Aunt  30's  . Stroke Maternal Grandmother   . Cancer Maternal Grandfather        prostate  . Stroke Maternal Grandfather     Social History: Works as Dealer Social History   Social History  . Marital status: Widowed    Spouse name: N/A  . Number of children: N/A  . Years of education: N/A   Occupational History  . Not on file.   Social History Main Topics  . Smoking status: Former Smoker    Packs/day: 1.00    Years: 30.00    Types: Cigarettes    Quit date: 07/24/2002  . Smokeless tobacco: Never Used     Comment: quit smoking in 2000  . Alcohol use Yes     Comment: occasional, 1-2 drinks per month  . Drug use: No  . Sexual activity: No   Other Topics Concern  . Not on file   Social  History Narrative  . No narrative on file    Allergies: Allergies  Allergen Reactions  . Adhesive [Tape] Other (See Comments)    Burns the skin  . Z-Pak [Azithromycin] Itching  . Antifungal [Miconazole Nitrate] Rash  . Sulfa Antibiotics Nausea And Vomiting and Rash    N/T    Current Medications: Current Outpatient Prescriptions  Medication Sig Dispense Refill  . acetaminophen (TYLENOL) 500 MG tablet Take 1,000 mg by mouth every 6 (six) hours as needed for mild pain.     Marland Kitchen aspirin EC 81 MG tablet Take 81 mg by mouth at bedtime.    Marland Kitchen atorvastatin (LIPITOR) 10 MG tablet TAKE ONE TABLET BY MOUTH ONCE DAILY (Patient taking differently: Take 10 mg by mouth at bedtime. ) 90 tablet 3  . Cholecalciferol (VITAMIN D3) 2000 units TABS Take 2,000 Units by mouth 3 (three) times a week.    . diazepam (VALIUM) 5 MG tablet Take 1 tablet (5 mg total) by mouth every 12 (twelve) hours as needed for anxiety. 30 tablet 0  . esomeprazole (NEXIUM) 20 MG capsule Take 20 mg by mouth daily before breakfast.     . ibuprofen (ADVIL,MOTRIN) 200 MG tablet Take 200 mg by mouth every 6 (six) hours as needed for mild pain.     . metoprolol tartrate (LOPRESSOR) 25 MG tablet TAKE ONE TABLET BY MOUTH TWICE DAILY 180 tablet 2  . naproxen sodium (ANAPROX) 220 MG tablet Take 440 mg by mouth 2 (two) times daily as needed (for pain/headache.).    Marland Kitchen nitroGLYCERIN (NITROSTAT) 0.4 MG SL tablet Place 1 tablet (0.4 mg total) under the tongue every 5 (five) minutes as needed for chest pain. 25 tablet 3  . Turmeric 500 MG CAPS Take 500 mg by mouth 2 (two) times daily.     No current facility-administered medications for this visit.     Review of Systems General: negative, no fevers  HEENT: concerned about swelling around the left next catheter site  Lungs: no complaints  Cardiac: no complaints  GI: no complaints  GU: no nausea, vomiting, diarrhea, constipation  Musculoskeletal: no complaints  Extremities: leg swelling,  right finger pain   Skin: redness chest around the port site  Neuro: peripheral neuropathy with foot pain  Endocrine: no complaints  Psych: no complaints     Objective:  Physical Examination:  BP 110/71 (BP Location: Left Arm, Patient Position: Sitting)   Pulse 73   Temp 97.8 F (36.6 C) (Tympanic)   Resp 12   Ht '5\' 1"'  (1.549 m)   Wt 172  lb 6.4 oz (78.2 kg)   BMI 32.57 kg/m    ECOG Performance Status: 1 - Symptomatic but completely ambulatory  General appearance: alert, cooperative and appears stated age HEENT:PERRLA, extra ocular movement intact and sclera clear, anicteric  CV: RRR Lungs: BCTA Lymph node survey: non-palpable, axillary, inguinal, supraclavicular.  Abdomen: soft, non-tender, without masses or organomegaly, no hernias and well healed incision Back: inspection of back is normal Skin exam - erythema around the chest port incision measuring about 5 cm and warm to palpation.  Neurological exam reveals alert, oriented, normal speech, no focal findings or movement disorder noted.  Pelvic: deferred based on last exam. exam chaperoned by nurse;  Vulva: normal appearing vulva with no masses, tenderness or lesions; Vagina: normal vagina;  Adnexa/Uterus/Cervix:absent. Bimanual: no masses or tenderness.  Rectal: normal rectal, confirmatory    Assessment:  Jaime Hall is a 66 y.o. 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 Complete resolution of vaginal cuff abscess.  Severe pelvic adhesive disease.  Now s/p 3 cycles of carboplatin/taxol and presently receiving whole pelvic radiation with Dr Baruch Gouty.  NED today.  Medical co-morbidities complicating care: 30 pack year former  smoker s/p coronary angioplasty and stents 2013 and takes baby ASA.  Plan:   Problem List Items Addressed This Visit      Genitourinary   Endometrial cancer ([place]) - Primary     I have recommended continued close follow up with exams, including pelvic exams every 3-6 months for 2-3 years, then every 6-12 months for 3-5 years and then annually thereafter. She will see Dr. Leonides Schanz in 3 months and then in our clinic in 6 months.  Imaging and laboratory assessment is based on clinical indication. Patient education for obesity, lifestyle, exercise, nutrition, smoking cessation, sexual health, vaginal lubricants. We discussed her weight and need for weight loss, stressed good nutrition, and exercise.  I provided information regarding calorie counting and exercise for weight loss. I offered referral to the CARE program and provided a pamphlet today.    Reviewed her risk of recurrence using [employer] App - which calculates to 15% locoregional recurrence with EBRT. Using the App for type 2 prognosis her probably of 3 and 5-year survival are 93%, and 89% respectively.   The patient's diagnosis, an outline of the further diagnostic and laboratory studies which will be required, the recommendation, and alternatives were discussed.  All questions were answered to the patient's satisfaction.  [name], MD  CC:  [name], MD [address] [place], [place] [address] [phone]  Larey Days, MD  Hollice Espy, MD

## 2017-03-24 ENCOUNTER — Encounter: Payer: Self-pay | Admitting: Obstetrics and Gynecology

## 2017-03-24 ENCOUNTER — Inpatient Hospital Stay: Payer: PPO | Attending: Obstetrics and Gynecology | Admitting: Obstetrics and Gynecology

## 2017-03-24 ENCOUNTER — Other Ambulatory Visit: Payer: Self-pay | Admitting: *Deleted

## 2017-03-24 VITALS — BP 110/71 | HR 73 | Temp 97.8°F | Resp 12 | Ht 61.0 in | Wt 172.4 lb

## 2017-03-24 DIAGNOSIS — E669 Obesity, unspecified: Secondary | ICD-10-CM | POA: Diagnosis not present

## 2017-03-24 DIAGNOSIS — I739 Peripheral vascular disease, unspecified: Secondary | ICD-10-CM | POA: Insufficient documentation

## 2017-03-24 DIAGNOSIS — Z955 Presence of coronary angioplasty implant and graft: Secondary | ICD-10-CM

## 2017-03-24 DIAGNOSIS — E785 Hyperlipidemia, unspecified: Secondary | ICD-10-CM | POA: Diagnosis not present

## 2017-03-24 DIAGNOSIS — I872 Venous insufficiency (chronic) (peripheral): Secondary | ICD-10-CM

## 2017-03-24 DIAGNOSIS — Z90710 Acquired absence of both cervix and uterus: Secondary | ICD-10-CM | POA: Insufficient documentation

## 2017-03-24 DIAGNOSIS — Z8571 Personal history of Hodgkin lymphoma: Secondary | ICD-10-CM

## 2017-03-24 DIAGNOSIS — C541 Malignant neoplasm of endometrium: Secondary | ICD-10-CM

## 2017-03-24 DIAGNOSIS — Z923 Personal history of irradiation: Secondary | ICD-10-CM

## 2017-03-24 DIAGNOSIS — I2510 Atherosclerotic heart disease of native coronary artery without angina pectoris: Secondary | ICD-10-CM | POA: Insufficient documentation

## 2017-03-24 DIAGNOSIS — Z90722 Acquired absence of ovaries, bilateral: Secondary | ICD-10-CM | POA: Insufficient documentation

## 2017-03-24 DIAGNOSIS — R600 Localized edema: Secondary | ICD-10-CM

## 2017-03-24 NOTE — Progress Notes (Signed)
Patient here for follow up. No changes since last appointment.  

## 2017-03-24 NOTE — Patient Instructions (Signed)
I have recommended continued close follow up with exams, including pelvic exams every 3-6 months for 2-3 years, then every 6-12 months for 3-5 years and then annually thereafter.  Imaging and laboratory assessment is based on clinical indication. Patient education for obesity, lifestyle, exercise, nutrition, smoking cessation, sexual health, vaginal lubricants. We discussed her weight and need for weight loss, stressed good nutrition, and exercise.  I provided information regarding calorie counting and exercise for weight loss. I offered referral to the CARE program and provided a pamphlet today.   Please scheduled appointment with Dr. Leonides Schanz in January 2019.

## 2017-03-24 NOTE — Progress Notes (Signed)
  Oncology Nurse Navigator Documentation Chaperoned pelvic exam.  Navigator Location: [facility] (03/24/17 1400)   )Navigator Encounter Type: Follow-up Appt (03/24/17 1400)                     Patient Visit Type: GynOnc (03/24/17 1400)                              Time Spent with Patient: 15 (03/24/17 1400)

## 2017-03-25 ENCOUNTER — Telehealth: Payer: Self-pay | Admitting: *Deleted

## 2017-03-25 NOTE — Telephone Encounter (Signed)
Called pt to let her know that I have ordered the lymphedema treatment. I called rehab today to check and make sure they see the order and they do. There is only 1 person who does the lower ext. Edema and it may take a few weeks to start but they will call you within 1 week to give you an appt. . Pt agreeable to the plan.

## 2017-03-26 ENCOUNTER — Other Ambulatory Visit: Payer: Self-pay | Admitting: Cardiovascular Disease

## 2017-04-08 ENCOUNTER — Encounter: Payer: Self-pay | Admitting: Occupational Therapy

## 2017-04-08 ENCOUNTER — Ambulatory Visit: Payer: PPO | Attending: Oncology | Admitting: Occupational Therapy

## 2017-04-08 DIAGNOSIS — I890 Lymphedema, not elsewhere classified: Secondary | ICD-10-CM | POA: Diagnosis not present

## 2017-04-08 NOTE — Patient Instructions (Signed)

## 2017-04-12 ENCOUNTER — Ambulatory Visit: Payer: PPO | Admitting: Occupational Therapy

## 2017-04-12 DIAGNOSIS — I890 Lymphedema, not elsewhere classified: Secondary | ICD-10-CM

## 2017-04-12 NOTE — Therapy (Signed)
[place] MAIN [facility] SERVICES [address] [place], [place], [address] Phone: [phone]   Fax:  [phone]  Occupational Therapy Treatment  Patient Details  Name: Jaime Hall MRN: [id number] Date of Birth: 1950/06/16 Referring Provider: Sindy Guadeloupe, MD  Encounter Date: 04/12/2017      OT End of Session - 04/12/17 1707    Visit Number 2   Number of Visits 36   Date for OT Re-Evaluation 07/07/17   OT Start Time 0210   OT Stop Time 0310   OT Time Calculation (min) 60 min   Activity Tolerance Patient tolerated treatment well;No increased pain   Behavior During Therapy WFL for tasks assessed/performed      Past Medical History:  Diagnosis Date  . Anxiety   . Cervicalgia   . Coronary artery disease    a. 02/2012 Stress echo: severe anterior wall ischemia;  b. 02/2012 Cath/PCI: LAD 95p (3.0 x 15 Xience EX DES), D1 90ost (PTCA - bifurcational dzs), EF 45% with anterior HK;  b. 02/2013 Ex MV: fixed anterior defect w/ minor reversibility, nl EF-->Med Rx.  . Endometrial cancer ([place])    a. 07/2016 s/p robotic hysterectomy, BSO w/ washings, sentinel node inj, mapping, bx, adhesiolysis.  . Essential hypertension, benign   . Fibrocystic breast disease   . GERD (gastroesophageal reflux disease)   . Gestational hypertension   . Heart murmur   . History of anemia   . History of blood transfusion   . Hodgkin's lymphoma ([place]) 2011   a. s/p radiation and chemo therapy  . Osteoarthritis   . Polycystic ovarian disease     Past Surgical History:  Procedure Laterality Date  . ABDOMINAL HYSTERECTOMY    . bladder sling    . CARDIAC CATHETERIZATION  02/2012   [facility] 1 stent place  . CERVICAL POLYPECTOMY    . CHOLECYSTECTOMY  1982  . COLONOSCOPY WITH PROPOFOL N/A 02/05/2015   Procedure: COLONOSCOPY WITH PROPOFOL;  Surgeon: Lucilla Lame, MD;  Location: [facility] ENDOSCOPY;  Service: Endoscopy;  Laterality: N/A;  . CORONARY ANGIOPLASTY  02/2012    left/right s/p balloon  . CYSTOGRAM N/A 08/17/2016   Procedure: CYSTOGRAM;  Surgeon: Hollice Espy, MD;  Location: [facility] ORS;  Service: Urology;  Laterality: N/A;  . CYSTOSCOPY N/A 08/17/2016   Procedure: CYSTOSCOPY EXAM UNDER ANESTHESIA;  Surgeon: Hollice Espy, MD;  Location: [facility] ORS;  Service: Urology;  Laterality: N/A;  . CYSTOSCOPY W/ RETROGRADES Bilateral 08/17/2016   Procedure: CYSTOSCOPY WITH RETROGRADE PYELOGRAM;  Surgeon: Hollice Espy, MD;  Location: [facility] ORS;  Service: Urology;  Laterality: Bilateral;  . CYSTOSCOPY WITH STENT PLACEMENT Right 08/17/2016   Procedure: CYSTOSCOPY WITH STENT PLACEMENT;  Surgeon: Hollice Espy, MD;  Location: [facility] ORS;  Service: Urology;  Laterality: Right;  . heart stent'  2013  . kidney stent Right 2018  . LYMPH NODE BIOPSY  2011   diagnosis of hodgkins lymphoma  . PELVIC LYMPH NODE DISSECTION N/A 07/29/2016   Procedure: PELVIC/AORTIC LYMPH NODE SAMPLING;  Surgeon: [name], MD;  Location: [facility] ORS;  Service: Gynecology;  Laterality: N/A;  . PORTA CATH INSERTION N/A 09/22/2016   Procedure: Glori Luis Cath Insertion;  Surgeon: [name], MD;  Location: [place] CV LAB;  Service: Cardiovascular;  Laterality: N/A;  . PORTA CATH REMOVAL N/A 11/17/2016   Procedure: Glori Luis Cath Removal;  Surgeon: [name], MD;  Location: [place] [facility] LAB;  Service: Cardiovascular;  Laterality: N/A;  . ROBOTIC ASSISTED TOTAL HYSTERECTOMY  WITH BILATERAL SALPINGO OOPHERECTOMY N/A 07/29/2016   Procedure: ROBOTIC ASSISTED TOTAL HYSTERECTOMY WITH BILATERAL SALPINGO OOPHORECTOMY;  Surgeon: [name], MD;  Location: [facility] ORS;  Service: Gynecology;  Laterality: N/A;  . SENTINEL NODE BIOPSY N/A 07/29/2016   Procedure: SENTINEL NODE BIOPSY;  Surgeon: [name], MD;  Location: [facility] ORS;  Service: Gynecology;  Laterality: N/A;  . transobturator sling N/A 2009   [place]    There were no vitals filed for this visit.       Subjective Assessment - 04/12/17 1654    Subjective  Pt returns for OT  visit 2 to address BLE lymphedema 2/2 lymphoma and endometrial cancer treatment. Pt has no new complaints since  last seen on 10/25 for initial evaluation.    Pertinent History CVI, PAD, Ultrasound negative for DVT and + for baker's cyst; chemo-induced periferal neuropathy, endometrial ca, pelvic adhesive disease, PAD, recent weight loss reported as ~ 25-30 pounds, hx Hodgkin's lymphoma ( s/p xrt and chemo), OA, abdominal hysterectomy, oophorectomy, pelvic LN disection(pelvic/ aortic node biopsy 0/3 LN), post op infection/ vaginal cuff abcess, severe pelvic adhesive disease, , s/p chemotherapy and whole pelvic radiation   Limitations leg swelling, L>R) , BLE pain/ discomfort, decreased sensation / neuropathic pain in feet and hands, difficulty walking, decreased standing tolerance, decreased balance w/ increased falls risk, difficulty fitting LB clothing and preferred street shoes, mildly impaired functional mobility and transferes   Patient Stated Goals decrease leg swelling and pain.    Currently in Pain? Yes  Leg pain and neuropathic pain unchanged since last visit Not rated   Pain Onset Other (comment)  post chemo            [facility] OT Assessment - 04/12/17 0001      Assessment   Diagnosis Mild, stage 1, BLE lymphedema 2/2 CVI and  treatment for Hodgkin's lymphoma and recent endometrial cancer.    Referring Provider Sindy Guadeloupe, MD   Onset Date 12/13/16   Prior Therapy no CDT; no compression     Precautions   Precaution Comments LE precautions, fall risk     Balance Screen   Has the patient fallen in the past 6 months Yes   How many times? 1 reported   Has the patient had a decrease in activity level because of a fear of falling?  No   Is the patient reluctant to leave their home because of a fear of falling?  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    Vocation Requirements standing, walking, lifting   Leisure family     ADL   Eating/Feeding Independent   Grooming Independent   Upper Body Bathing Independent   Lower Body Bathing Independent   Upper Body Dressing Independent  I   Lower Body Dressing Increased time;Needs assist for fasteners   Toilet Transfer Modified independent   Toileting - Clothing Manipulation Increased time   Toileting -  Hygiene Increase time   ADL comments difficulty fitting LB clothing and preferred street shoes; needs extra time with all clothing fasteners  due to periferal neuropathy     IADL   Shopping Takes care of all shopping needs independently   Light Housekeeping Does personal laundry completely   Meal Prep Plans, prepares and serves adequate meals independently   Programmer, applications own vehicle   Medication Management Is responsible for taking medication in correct dosages at correct time   Physiological scientist financial matters independently (budgets, writes checks, pays rent, bills goes to bank), collects and keeps track of income     Mobility   Mobility Status History of falls   Mobility Status Comments decreased balance and sensation     Vision - History   Baseline Vision Wears glasses all the time     Vision Assessment   Eye Alignment Within Functional Limits     Activity Tolerance   Activity Tolerance Tolerate 30+ min activity without fatigue   Activity Tolerance Comments standing and walking tolerance decreased  by neuropathic pain and BLE swelling      Cognition   Overall Cognitive Status Within Functional Limits for tasks assessed     Observation/Other Assessments   Observations LLE limb volume > RLE by palpation and visual assessment appears ~ 10-15%. Distribution appears more proximal.      Coordination   Gross Motor Movements are Fluid and Coordinated Yes   Fine Motor Movements are Fluid and Coordinated Yes     ROM / Strength   AROM / PROM / Strength AROM;PROM;Strength     AROM   Overall AROM  Within functional limits for tasks performed     Strength   Overall Strength Within functional limits for tasks performed         LYMPHEDEMA/ONCOLOGY QUESTIONNAIRE - 04/12/17 1702      Right Lower Extremity Lymphedema   Other RLE below knee (A-D) limb volume = 3578.18 ml. Knee to groin (E-G) limb volume = 6902.16  ml. LLE full leg  -ankle to groin (A-G) limb volume = 10480.34 ml.      Left Lower Extremity Lymphedema   Other LLE below knee (A-D) limb volume = 3924.59 ml. Knee to groin (E-G) limb volume = 7107.88 ml. LLE full leg  -ankle to groin (A-G) limb volume = 10480.34 ml.    Other Limb volume differential at A-D measures 8.83%, L>R. LVD at A-G segment measures 2.89%, L>R. full leg (A-G) measures 5.0%.                  OT Treatments/Exercises (OP) - 04/12/17 1657      Transfers   Transfers --   Sit to Stand --   Stand to Sit --     ADLs   ADL Education Given Yes     Manual Therapy   Manual Therapy Edema management;Compression Bandaging   Manual therapy comments BLE comparative limb volumetrics   Compression Bandaging Ankle to below Knee (A-D)  LLE gradient compression wraps applied  as follows: Toe wrap omitted. . Knee length cotton stockinett from base of toes to knee. One roll Rasidol soft foam. cumferentially from foot to tibila tuberosity w/ ~ 50% overlap; single 8 cm x 5 m  to foot and ankle, one 12 cm  x 5 m ankle to achilles origin...all applied circumferentially  in custommary layered gradient configuration. Fitted w/ 3 x non-slip sock as Pt able to fit shoe over wraps.                 OT Education - 04/12/17 1701    Education provided  Yes   Education Details Continued skilled Pt/caregiver education  And LE ADL training throughout visit for lymphedema self care/ home program, including compression wrapping, compression garment and device wear/care, lymphatic pumping ther ex, simple self-MLD, and skin care. Discussed progress towards goals.   Person(s) Educated Patient   Methods Explanation;Demonstration   Comprehension Verbalized understanding;Returned demonstration             OT Long Term Goals - 04/08/17 1346      OT LONG TERM GOAL #1   Title Pt independent w/ lymphedema precautions and prevention principals and strategies to limit LE progression and infection risk.   Baseline dependent   Time 2   Period Weeks   Status New   Target Date 04/26/17     OT LONG TERM GOAL #2   Title Lymphedema (LE) management/ self-care: Pt able to apply thigh length, multi layered, gradient compression wraps with modified independence ( with extra time)  using proper techniques within 2 weeks to achieve optimal limb volume reductions in preparation for fitting compression garments/ devices bilaterally.   Baseline dependent   Time 2   Period Weeks   Status New   Target Date 04/26/17     OT LONG TERM GOAL #3   Title Lymphedema (LE) management/ self-care:  Pt to achieve at least 10% LLE limb volume reductions  in the full LLE and 5% limb volume reduction in the RLE during Intensive Phase CDT to limit LE progression, to reduce pain, to improve ADLs performance.   Baseline Max A   Time 12   Period Weeks   Status New   Target Date 07/11/17     OT LONG TERM GOAL #4   Title Lymphedema (LE) management/ self-care:  Pt to tolerate daily compression wraps, compression garments and/ or HOS devices in keeping w/ prescribed wear regime within 1 week of  issue date of each to progress and retain clinical and functional gains and to limit LE progression.   Baseline Max A   Time 12   Period Weeks   Status New   Target Date 07/11/17     OT LONG TERM GOAL #5   Title Lymphedema (LE) management/ self-care:  During Management Phase CDT Pt to sustain current limb volumes within 5%, and all other clinical gains achieved during OT treatment WITH MAXIMUM CAREGIVER ASSISTANCE  to control leg and foot swelling, to  limit LE progression, to dec4rease infection risk and to limit further functional decline.   Baseline dependent   Time 6   Period Months   Status New   Target Date 10/09/17               Plan - 04/12/17 1708    Clinical Impression Statement Initial BLE comparative limb volumetrics reveal distrivbution of swelling in the LLE is concentrated primarily below the knee, which is atypical for lymphedema secondary to gyn cancers  in my experience. Typically limb swelling in these cases starts more proximally and progresses distally. Limb volume differential (LVD) for leg below the knee (A-D segment)  measures 8.83%, L>R. LVD for the knee to groin segment  (E-G) measures 2.89%, L>R. LVD fior the full leg (A-G) measures 5.0%. L>R. Swelling predominated below the knee where LVD typically is 2-3% greater in the dominant limb. The affected limb in this case is non-dominant. LVD for the thigh is close to WNL and full leg swelling is only slightly increased above typical.  Limb vbolume below the knee is clearly elevated and willbenefit  frmo treatment.  Pt verbalized understanding that although only very few lymph nodes were disected during surgery, the cumulative trauma of multiple courses of radiation treatment, combined with increased lymphatic load following extensive pelvic adhesiiolysis, and post op infection are contributing factors to lymphedema. She also understands CVI may have a significant role in secondary LE; however, she senies being   diagnosed with poor circulation in her legs in the past. Pt tolerated compression wrapping to knee today using gradient method. She agrees to remove wrap if it is very uncomfortable , otherwise she will leave it in place until this time tomorrow. We'll commence ADL training for self wrapping next session.     Occupational performance deficits (Please refer to evaluation for details): ADL's;IADL's;Rest and Sleep;Work;Leisure;Social Participation   Rehab Potential Good   OT Frequency 3x / week      Patient will benefit from skilled therapeutic intervention in order to improve the following deficits and impairments:     Visit Diagnosis: Lymphedema, not elsewhere classified    Problem List Patient Active Problem List   Diagnosis Date Noted  . Cerumen impaction 01/12/2017  . Chemotherapy-induced peripheral neuropathy ([place]) 12/10/2016  . 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 SIRS (systemic inflammatory response syndrome) ([place]) 08/05/2016  . Endometrial cancer ([place])   . Pelvic adhesive disease   . Lymphedema 07/20/2016  . Chronic venous insufficiency 07/20/2016  . PAD (peripheral artery disease) ([place]) 07/20/2016  . Postmenopausal bleeding 07/14/2016  . Influenza 07/14/2016  . Class 1 obesity due to excess calories without serious comorbidity with body mass index (BMI) of 34.0 to 34.9 in adult 05/27/2016  . Chronic cough 12/19/2015  . Pre-syncope 12/19/2015  . Hx of colonic polyps   . Benign neoplasm of sigmoid colon   . Dysphagia, pharyngoesophageal phase 10/01/2014  . Prolapsed, uterovaginal, incomplete 06/24/2014  . Routine general medical examination at a health care facility 12/30/2012  . Obesity (BMI 30-39.9) 12/30/2012  . Hx of multiple pulmonary nodules 12/28/2012  . Plantar fasciitis of right foot 12/28/2012  . 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 Edema of both legs 09/12/2012  . Hip pain, bilateral 09/12/2012  . History of Hodgkin's lymphoma 09/12/2012   . Coronary artery disease   . Hyperlipidemia   . Chest pain on exertion 02/29/2012    Andrey Spearman, MS, OTR/L, [facility] 04/12/17 5:24 PM  [place] MAIN [facility] SERVICES [address] [place], [place], [address] Phone: [phone]   Fax:  [phone]  Name: Jaime Hall MRN: [id number] Date of Birth: 1951-02-24

## 2017-04-12 NOTE — Therapy (Signed)
[place] MAIN [facility] SERVICES [address] [place], [place], [address] Phone: [phone]   Fax:  [phone]  Occupational Therapy Evaluation  Patient Details  Name: Jaime Hall MRN: [id number] Date of Birth: Aug 17, 1950 Referring Provider: Sindy Guadeloupe, MD  Encounter Date: 04/08/2017    Past Medical History:  Diagnosis Date  . Anxiety   . Cervicalgia   . Coronary artery disease    a. 02/2012 Stress echo: severe anterior wall ischemia;  b. 02/2012 Cath/PCI: LAD 95p (3.0 x 15 Xience EX DES), D1 90ost (PTCA - bifurcational dzs), EF 45% with anterior HK;  b. 02/2013 Ex MV: fixed anterior defect w/ minor reversibility, nl EF-->Med Rx.  . Endometrial cancer ([place])    a. 07/2016 s/p robotic hysterectomy, BSO w/ washings, sentinel node inj, mapping, bx, adhesiolysis.  . Essential hypertension, benign   . Fibrocystic breast disease   . GERD (gastroesophageal reflux disease)   . Gestational hypertension   . Heart murmur   . History of anemia   . History of blood transfusion   . Hodgkin's lymphoma ([place]) 2011   a. s/p radiation and chemo therapy  . Osteoarthritis   . Polycystic ovarian disease     Past Surgical History:  Procedure Laterality Date  . ABDOMINAL HYSTERECTOMY    . bladder sling    . CARDIAC CATHETERIZATION  02/2012   [facility] 1 stent place  . CERVICAL POLYPECTOMY    . CHOLECYSTECTOMY  1982  . COLONOSCOPY WITH PROPOFOL N/A 02/05/2015   Procedure: COLONOSCOPY WITH PROPOFOL;  Surgeon: Lucilla Lame, MD;  Location: [facility] ENDOSCOPY;  Service: Endoscopy;  Laterality: N/A;  . CORONARY ANGIOPLASTY  02/2012   left/right s/p balloon  . CYSTOGRAM N/A 08/17/2016   Procedure: CYSTOGRAM;  Surgeon: Hollice Espy, MD;  Location: [facility] ORS;  Service: Urology;  Laterality: N/A;  . CYSTOSCOPY N/A 08/17/2016   Procedure: CYSTOSCOPY EXAM UNDER ANESTHESIA;  Surgeon: Hollice Espy, MD;  Location: [facility] ORS;  Service: Urology;  Laterality: N/A;  . CYSTOSCOPY  W/ RETROGRADES Bilateral 08/17/2016   Procedure: CYSTOSCOPY WITH RETROGRADE PYELOGRAM;  Surgeon: Hollice Espy, MD;  Location: [facility] ORS;  Service: Urology;  Laterality: Bilateral;  . CYSTOSCOPY WITH STENT PLACEMENT Right 08/17/2016   Procedure: CYSTOSCOPY WITH STENT PLACEMENT;  Surgeon: Hollice Espy, MD;  Location: [facility] ORS;  Service: Urology;  Laterality: Right;  . heart stent'  2013  . kidney stent Right 2018  . LYMPH NODE BIOPSY  2011   diagnosis of hodgkins lymphoma  . PELVIC LYMPH NODE DISSECTION N/A 07/29/2016   Procedure: PELVIC/AORTIC LYMPH NODE SAMPLING;  Surgeon: [name], MD;  Location: [facility] ORS;  Service: Gynecology;  Laterality: N/A;  . PORTA CATH INSERTION N/A 09/22/2016   Procedure: Glori Luis Cath Insertion;  Surgeon: [name], MD;  Location: [place] CV LAB;  Service: Cardiovascular;  Laterality: N/A;  . PORTA CATH REMOVAL N/A 11/17/2016   Procedure: Glori Luis Cath Removal;  Surgeon: [name], MD;  Location: [place] CV LAB;  Service: Cardiovascular;  Laterality: N/A;  . ROBOTIC ASSISTED TOTAL HYSTERECTOMY WITH BILATERAL SALPINGO OOPHERECTOMY N/A 07/29/2016   Procedure: ROBOTIC ASSISTED TOTAL HYSTERECTOMY WITH BILATERAL SALPINGO OOPHORECTOMY;  Surgeon: [name], MD;  Location: [facility] ORS;  Service: Gynecology;  Laterality: N/A;  . SENTINEL NODE BIOPSY N/A 07/29/2016   Procedure: SENTINEL NODE BIOPSY;  Surgeon: [name], MD;  Location: [facility] ORS;  Service: Gynecology;  Laterality: N/A;  . transobturator sling N/A 2009   [place]  There were no vitals filed for this visit.      Subjective Assessment - 04/12/17 1214    Subjective  Pt is referred to Occupational Therapy for evaluation and treatment of BLE lymphedema (LE) by Sindy Guadeloupe, MD. Pt reports she was diagnosed with Hodgkin's lymphoma in 2011 and was trteated with chemotherapy and radiation. She was diagnosed with endometrial cancer in February 2018, and  underwent a total hysterectomy, lymph node biopsy, (0/3 LN), oophorectomy, pelvic adhesiolysis including enterolysis and ovariolysis, chemotherapy and XRT. Post op abcess was treated with oral antibiotics and resolved.   Pt reports onset of leg swellling after a fall at home in July 2018 while undergoing XRT. 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 She has not previously undergone CDT for LE and does not wear compression garments.   Pertinent History CVI, PAD, Ultrasound negative for DVT and + for baker's cyst; chemo-induced periferal neuropathy, endometrial ca, pelvic adhesive disease, PAD, recent weight loss reported as ~ 25-30 pounds, hx Hodgkin's lymphoma ( s/p xrt and chemo), OA, abdominal hysterectomy, oophorectomy, pelvic LN disection(pelvic/ aortic node biopsy 0/3 LN), post op infection/ vaginal cuff abcess, severe pelvic adhesive disease, , s/p chemotherapy and whole pelvic radiation   Limitations leg swelling, L>R) , BLE pain/ discomfort, decreased sensation / neuropathic pain in feet and hands, difficulty walking, decreased standing tolerance, decreased balance w/ increased falls risk, difficulty fitting LB clothing and preferred street shoes, mildly impaired functional mobility and transferes   Patient Stated Goals decrease leg swelling and pain   Currently in Pain? Yes   Pain Score 4    Pain Location Leg   Pain Descriptors / Indicators Burning;Numbness;Pins and needles;Tightness;Discomfort;Heaviness  fullness   Pain Type Chronic pain;Surgical pain;Neuropathic pain   Pain Onset Other (comment)  post chemo   Pain Frequency Intermittent   Aggravating Factors  extended standing and walking,    Pain Relieving Factors medications, elevation   Multiple Pain Sites Yes           [facility] OT Assessment - 04/12/17 0001      Assessment   Diagnosis Mild, stage 1, BLE lymphedema 2/2 CVI and  treatment for Hodgkin's  lymphoma and recent endometrial cancer.    Referring Provider Sindy Guadeloupe, MD   Onset Date 12/13/16   Prior Therapy no CDT; no compression     Precautions   Precaution Comments LE precautions, fall risk     Balance Screen   Has the patient fallen in the past 6 months Yes   How many times? 1 reported   Has the patient had a decrease in activity level because of a fear of falling?  No   Is the patient reluctant to leave their home because of a fear of falling?  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    Vocation Requirements standing, walking, lifting   Leisure family     ADL   Eating/Feeding Independent   Grooming Independent   Upper Body Bathing Independent  Lower Body Bathing Independent   Upper Body Dressing Independent  I   Lower Body Dressing Increased time;Needs assist for fasteners   Toilet Transfer Modified independent   Toileting - Clothing Manipulation Increased time   Toileting -  Hygiene Increase time   ADL comments difficulty fitting LB clothing and preferred street shoes; needs extra time with all clothing fasteners due to periferal neuropathy     IADL   Shopping Takes care of all shopping needs independently   Light Housekeeping Does personal laundry completely   Meal Prep Plans, prepares and serves adequate meals independently   Programmer, applications own vehicle   Medication Management Is responsible for taking medication in correct dosages  at correct time   Physiological scientist financial matters independently (budgets, writes checks, pays rent, bills goes to bank), collects and keeps track of income     Mobility   Mobility Status History of falls   Mobility Status Comments decreased balance and sensation     Vision - History   Baseline Vision Wears glasses all the time     Vision Assessment   Eye Alignment Within Functional Limits     Activity Tolerance   Activity Tolerance Tolerate 30+ min activity without fatigue   Activity Tolerance Comments standing and walking tolerance decreased by neuropathic pain and BLE swelling      Cognition   Overall Cognitive Status Within Functional Limits for tasks assessed     Observation/Other Assessments   Observations LLE limb volume > RLE by palpation and visual assessment appears ~ 10-15%. Distribution appears more proximal.      Coordination   Gross Motor Movements are Fluid and Coordinated Yes   Fine Motor Movements are Fluid and Coordinated Yes     ROM / Strength   AROM / PROM / Strength AROM;PROM;Strength     AROM   Overall AROM  Within functional limits for tasks performed     Strength   Overall Strength Within functional limits for tasks performed          LYMPHEDEMA/ONCOLOGY QUESTIONNAIRE - 04/12/17 1336      Type   Cancer Type endometrial     Surgeries   Sentinel Lymph Node Biopsy Date 07/29/16   Number Lymph Nodes Removed 3     Date Lymphedema/Swelling Started   Date 12/13/16     Treatment   Active Chemotherapy Treatment No   Past Chemotherapy Treatment --  adjuvant for Hodgkin's and Endometrial.    Active Radiation Treatment No   Past Radiation Treatment --  2011 for Hodgkin's; completed 01/2017 for endometrial     What other symptoms do you have   Are you Having Heaviness or Tightness Yes   Are you having Pain Yes   Are you having pitting edema No   Is it Hard or Difficult finding clothes that fit Yes   Do you have infections No    Stemmer Sign No     Lymphedema Stage   Stage STAGE 1 SPONTANEOUSLY REVERSIBLE     Lymphedema Assessments   Lymphedema Assessments Lower extremities     Right Lower Extremity Lymphedema   Other BLE Comparative Limb volumetrics  TBA on OT Rx day 1                OT Treatments/Exercises (OP) - 04/12/17 0001      Transfers   Transfers Sit to Stand;Stand to Sit   Sit to Stand 6: Modified independent (Device/Increase time);With armrests;With upper extremity assist;From elevated  surface;From bed;From chair/3-in-1   Stand to Sit 6: Modified independent (Device/Increase time);With upper extremity assist;With armrests;To chair/3-in-1;To bed     ADLs   Overall ADLs In addition to pain and lef swelling, fatigue alo contributes to impaired ADLs performance    ADL Education Given Yes     Manual Therapy   Manual Therapy Edema management                    OT Long Term Goals - 04/08/17 1346      OT LONG TERM GOAL #1   Title Pt independent w/ lymphedema precautions and prevention principals and strategies to limit LE progression and infection risk.   Baseline dependent   Time 2   Period Weeks   Status New   Target Date 04/26/17     OT LONG TERM GOAL #2   Title Lymphedema (LE) management/ self-care: Pt able to apply thigh length, multi layered, gradient compression wraps with modified independence ( with extra time)  using proper techniques within 2 weeks to achieve optimal limb volume reductions in preparation for fitting compression garments/ devices bilaterally.   Baseline dependent   Time 2   Period Weeks   Status New   Target Date 04/26/17     OT LONG TERM GOAL #3   Title Lymphedema (LE) management/ self-care:  Pt to achieve at least 10% LLE limb volume reductions  in the full LLE and 5% limb volume reduction in the RLE during Intensive Phase CDT to limit LE progression, to reduce pain, to improve ADLs performance.   Baseline Max A   Time 12   Period Weeks    Status New   Target Date 07/11/17     OT LONG TERM GOAL #4   Title Lymphedema (LE) management/ self-care:  Pt to tolerate daily compression wraps, compression garments and/ or HOS devices in keeping w/ prescribed wear regime within 1 week of issue date of each to progress and retain clinical and functional gains and to limit LE progression.   Baseline Max A   Time 12   Period Weeks   Status New   Target Date 07/11/17     OT LONG TERM GOAL #5   Title Lymphedema (LE) management/ self-care:  During Management Phase CDT Pt to sustain current limb volumes within 5%, and all other clinical gains achieved during OT treatment WITH MAXIMUM CAREGIVER ASSISTANCE  to control leg and foot swelling, to  limit LE progression, to dec4rease infection risk and to limit further functional decline.   Baseline dependent   Time 6   Period Months   Status New   Target Date 10/09/17               Plan - 04/12/17 1342    Clinical Impression Statement Jaime Hall is a 66 y o female presenting with mild, stage 1-2, BLE lymphedema (LE) , L>R, secondary to history of CVI and treatment for Hodgkin's lymphoma in 2011 and endometrial cancer in 2018. Noticeable onset occurred 4 months ago in July 2018 shortly after a fall and while undergoing adjuvant XRT. Pt endorses fluctuating leg swelling in the past that typically resolved with elevation. Pt denies family Hx of LE. Asymmetrical leg swelling and pain limit Pt's ability to perform basic and instrumental ADLs, to perform productive activities, impairs social participation and negatively impacts body image. Jaime Hall will benefit from Occupational Therapy for Complete Decongestive Therapy (CDT) to decrease swelling and associated pain/ discomfort, to limit  infection risk, and to limit LE progression over time. CDT will include Manual Lymphatic Drainage (MLD), skin care, therapeutic exercise, compression therapy, and extensive LE self -care training for LE  management. Without skilled OT Jaime Hall's condition will worsen and further functional decline is expected.   Occupational performance deficits (Please refer to evaluation for details): ADL's;IADL's;Rest and Sleep;Work;Leisure;Social Participation;Other  body image   Rehab Potential Good   OT Frequency 3x / week   OT Duration 12 weeks   OT Treatment/Interventions Self-care/ADL training;Therapeutic exercise;Patient/family education;Manual Therapy;Energy conservation;Manual lymph drainage;Therapeutic exercises;DME and/or AE instruction;Compression bandaging;Therapeutic activities   Clinical Decision Making Limited treatment options, no task modification necessary   OT Home Exercise Plan lymphatic pumping ther ex   Recommended Other Services fit with appropriate compression garments/ devices that are comfortable and that Pt can don and doff using AE PRN   Consulted and Agree with Plan of Care Patient      Patient will benefit from skilled therapeutic intervention in order to improve the following deficits and impairments:  Decreased endurance, Decreased skin integrity, Decreased knowledge of precautions, Decreased activity tolerance, Decreased knowledge of use of DME, Decreased balance, Decreased mobility, Difficulty walking, Impaired sensation, Increased edema, Pain, Other (comment), Increased muscle spasms (impaired body image related to LE swelling)  Visit Diagnosis: Lymphedema, not elsewhere classified - Plan: Ot plan of care cert/re-cert    Problem List Patient Active Problem List   Diagnosis Date Noted  . Cerumen impaction 01/12/2017  . Chemotherapy-induced peripheral neuropathy ([place]) 12/10/2016  . 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 SIRS (systemic inflammatory response syndrome) ([place]) 08/05/2016  . Endometrial cancer ([place])   . Pelvic adhesive disease   . Lymphedema 07/20/2016  . Chronic venous insufficiency 07/20/2016  . PAD (peripheral artery disease) ([place]) 07/20/2016  .  Postmenopausal bleeding 07/14/2016  . Influenza 07/14/2016  . Class 1 obesity due to excess calories without serious comorbidity with body mass index (BMI) of 34.0 to 34.9 in adult 05/27/2016  . Chronic cough 12/19/2015  . Pre-syncope 12/19/2015  . Hx of colonic polyps   . Benign neoplasm of sigmoid colon   . Dysphagia, pharyngoesophageal phase 10/01/2014  . Prolapsed, uterovaginal, incomplete 06/24/2014  . Routine general medical examination at a health care facility 12/30/2012  . Obesity (BMI 30-39.9) 12/30/2012  . Hx of multiple pulmonary nodules 12/28/2012  . Plantar fasciitis of right foot 12/28/2012  . 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 Edema of both legs 09/12/2012  . Hip pain, bilateral 09/12/2012  . History of Hodgkin's lymphoma 09/12/2012  . Coronary artery disease   . Hyperlipidemia   . Chest pain on exertion 02/29/2012    Jaime Spearman, MS, OTR/L, [facility] 04/12/17 4:52 PM  [place] MAIN [facility] SERVICES [address] [place], [place], [address] Phone: [phone]   Fax:  [phone]  Name: KISSA CAMPOY MRN: [id number] Date of Birth: [birth year]

## 2017-04-19 ENCOUNTER — Ambulatory Visit: Payer: PPO | Attending: Oncology | Admitting: Occupational Therapy

## 2017-04-19 DIAGNOSIS — I890 Lymphedema, not elsewhere classified: Secondary | ICD-10-CM

## 2017-04-19 NOTE — Therapy (Signed)
[place] MAIN [facility] SERVICES [address] [place], [place], [address] Phone: [phone]   Fax:  [phone]  Occupational Therapy Treatment  Patient Details  Name: Jaime Hall MRN: [id number] Date of Birth: 1951/03/15 Referring Provider: Sindy Guadeloupe, MD   Encounter Date: 04/19/2017  OT End of Session - 04/19/17 1612    Visit Number  3    Number of Visits  36    Date for OT Re-Evaluation  07/07/17    OT Start Time  0200    OT Stop Time  0300    OT Time Calculation (min)  60 min    Activity Tolerance  Patient tolerated treatment well;No increased pain    Behavior During Therapy  WFL for tasks assessed/performed       Past Medical History:  Diagnosis Date  . Anxiety   . Cervicalgia   . Coronary artery disease    a. 02/2012 Stress echo: severe anterior wall ischemia;  b. 02/2012 Cath/PCI: LAD 95p (3.0 x 15 Xience EX DES), D1 90ost (PTCA - bifurcational dzs), EF 45% with anterior HK;  b. 02/2013 Ex MV: fixed anterior defect w/ minor reversibility, nl EF-->Med Rx.  . Endometrial cancer ([place])    a. 07/2016 s/p robotic hysterectomy, BSO w/ washings, sentinel node inj, mapping, bx, adhesiolysis.  . Essential hypertension, benign   . Fibrocystic breast disease   . GERD (gastroesophageal reflux disease)   . Gestational hypertension   . Heart murmur   . History of anemia   . History of blood transfusion   . Hodgkin's lymphoma ([place]) 2011   a. s/p radiation and chemo therapy  . Osteoarthritis   . Polycystic ovarian disease     Past Surgical History:  Procedure Laterality Date  . ABDOMINAL HYSTERECTOMY    . bladder sling    . CARDIAC CATHETERIZATION  02/2012   [facility] 1 stent place  . CERVICAL POLYPECTOMY    . CHOLECYSTECTOMY  1982  . CORONARY ANGIOPLASTY  02/2012   left/right s/p balloon  . heart stent'  2013  . kidney stent Right 2018  . LYMPH NODE BIOPSY  2011   diagnosis of hodgkins lymphoma  . transobturator sling N/A 2009    [place]    There were no vitals filed for this visit.  Subjective Assessment - 04/19/17 1607    Subjective   Pt returns for OT  visit 3 to address BLE lymphedema 2/2 lymphoma and endometrial cancer treatment. Pt has no new complaints. Pt brings compression wraps applied to LLE last visit. She tells me she did not attempt to reapply them herself.    Pertinent History  CVI, PAD, Ultrasound negative for DVT and + for baker's cyst; chemo-induced periferal neuropathy, endometrial ca, pelvic adhesive disease, PAD, recent weight loss reported as ~ 25-30 pounds, hx Hodgkin's lymphoma ( s/p xrt and chemo), OA, abdominal hysterectomy, oophorectomy, pelvic LN disection(pelvic/ aortic node biopsy 0/3 LN), post op infection/ vaginal cuff abcess, severe pelvic adhesive disease, , s/p chemotherapy and whole pelvic radiation    Limitations  leg swelling, L>R) , BLE pain/ discomfort, decreased sensation / neuropathic pain in feet and hands, difficulty walking, decreased standing tolerance, decreased balance w/ increased falls risk, difficulty fitting LB clothing and preferred street shoes, mildly impaired functional mobility and transferes    Patient Stated Goals  decrease leg swelling and pain.     Currently in Pain?  Yes unchanged. not rated numerically   unchanged. not rated numerically  Pain Onset  Other (comment) post chemo   post chemo                  OT Treatments/Exercises (OP) - 04/19/17 0001      Manual Therapy   Manual Therapy  Edema management;Manual Lymphatic Drainage (MLD)    Manual Lymphatic Drainage (MLD)  Manual lymph drainage in supine: short neck, left axillary nodes, superficial and deep abdominals, left inguino-axillary anastamoses, left lower extremity from toes and dorsal foot to lateral hip redirecting along pathway.    Compression Bandaging  Ankle to below Knee (A-D)  LLE gradient compression wraps applied  as follows: Toe wrap omitted. . Knee length cotton stockinett  from base of toes to knee. One roll Rasidol soft foam. cumferentially from foot to tibila tuberosity w/ ~ 50% overlap; single 8 cm x 5 m  to foot and ankle, one 12 cm  x 5 m ankle to achilles origin...all applied circumferentially in custommary layered gradient configuration. Fitted w/ 3 x non-slip sock as Pt able to fit shoe over wraps.              OT Education - 04/19/17 1608    Education provided  Yes    Education Details  Continued skilled Pt/caregiver education  And LE ADL training throughout visit for lymphedema self care/ home program, including compression wrapping, compression garment and device wear/care, lymphatic pumping ther ex, simple self-MLD, and skin care. Discussed progress towards goals.    Person(s) Educated  Patient    Methods  Explanation;Demonstration    Comprehension  Verbalized understanding;Returned demonstration          OT Long Term Goals - 04/08/17 1346      OT LONG TERM GOAL #1   Title  Pt independent w/ lymphedema precautions and prevention principals and strategies to limit LE progression and infection risk.    Baseline  dependent    Time  2    Period  Weeks    Status  New    Target Date  04/26/17      OT LONG TERM GOAL #2   Title  Lymphedema (LE) management/ self-care: Pt able to apply thigh length, multi layered, gradient compression wraps with modified independence ( with extra time)  using proper techniques within 2 weeks to achieve optimal limb volume reductions in preparation for fitting compression garments/ devices bilaterally.    Baseline  dependent    Time  2    Period  Weeks    Status  New    Target Date  04/26/17      OT LONG TERM GOAL #3   Title  Lymphedema (LE) management/ self-care:  Pt to achieve at least 10% LLE limb volume reductions  in the full LLE and 5% limb volume reduction in the RLE during Intensive Phase CDT to limit LE progression, to reduce pain, to improve ADLs performance.    Baseline  Max A    Time  12     Period  Weeks    Status  New    Target Date  07/11/17      OT LONG TERM GOAL #4   Title  Lymphedema (LE) management/ self-care:  Pt to tolerate daily compression wraps, compression garments and/ or HOS devices in keeping w/ prescribed wear regime within 1 week of issue date of each to progress and retain clinical and functional gains and to limit LE progression.    Baseline  Max A    Time  12  Period  Weeks    Status  New    Target Date  07/11/17      OT LONG TERM GOAL #5   Title  Lymphedema (LE) management/ self-care:  During Management Phase CDT Pt to sustain current limb volumes within 5%, and all other clinical gains achieved during OT treatment WITH MAXIMUM CAREGIVER ASSISTANCE  to control leg and foot swelling, to  limit LE progression, to dec4rease infection risk and to limit further functional decline.    Baseline  dependent    Time  6    Period  Months    Status  New    Target Date  10/09/17            Plan - 04/19/17 1613    Clinical Impression Statement  Pt tolerated LLE/LLQ MLD without difficulty this dession. Pt verbalized understanding oof intro level simple - self MLD training and compression wrap training . Compression wraps from foot below knee on L after MLD . Cont as per POC.    Occupational performance deficits (Please refer to evaluation for details):  ADL's;IADL's;Rest and Sleep;Work;Leisure;Social Participation;Other body image   body image   Rehab Potential  Good    OT Frequency  3x / week       Patient will benefit from skilled therapeutic intervention in order to improve the following deficits and impairments:     Visit Diagnosis: Lymphedema, not elsewhere classified    Problem List Patient Active Problem List   Diagnosis Date Noted  . Cerumen impaction 01/12/2017  . Chemotherapy-induced peripheral neuropathy ([place]) 12/10/2016  . 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 SIRS (systemic inflammatory response syndrome) ([place]) 08/05/2016  .  Endometrial cancer ([place])   . Pelvic adhesive disease   . Lymphedema 07/20/2016  . Chronic venous insufficiency 07/20/2016  . PAD (peripheral artery disease) ([place]) 07/20/2016  . Postmenopausal bleeding 07/14/2016  . Influenza 07/14/2016  . Class 1 obesity due to excess calories without serious comorbidity with body mass index (BMI) of 34.0 to 34.9 in adult 05/27/2016  . Chronic cough 12/19/2015  . Pre-syncope 12/19/2015  . Hx of colonic polyps   . Benign neoplasm of sigmoid colon   . Dysphagia, pharyngoesophageal phase 10/01/2014  . Prolapsed, uterovaginal, incomplete 06/24/2014  . Routine general medical examination at a health care facility 12/30/2012  . Obesity (BMI 30-39.9) 12/30/2012  . Hx of multiple pulmonary nodules 12/28/2012  . Plantar fasciitis of right foot 12/28/2012  . 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 Edema of both legs 09/12/2012  . Hip pain, bilateral 09/12/2012  . History of Hodgkin's lymphoma 09/12/2012  . Coronary artery disease   . Hyperlipidemia   . Chest pain on exertion 02/29/2012    Andrey Spearman, MS, OTR/L, [facility] 04/19/17 4:15 PM    [place] MAIN [facility] SERVICES [address] [place], [place], [address] Phone: [phone]   Fax:  [phone]  Name: LAIKYNN POLLIO MRN: [id number] Date of Birth: Oct 06, 1950

## 2017-04-19 NOTE — Patient Instructions (Signed)

## 2017-04-22 ENCOUNTER — Ambulatory Visit: Payer: PPO | Admitting: Occupational Therapy

## 2017-04-22 DIAGNOSIS — I890 Lymphedema, not elsewhere classified: Secondary | ICD-10-CM | POA: Diagnosis not present

## 2017-04-22 NOTE — Therapy (Signed)
[place] MAIN [facility] SERVICES [address] [place], [place], [address] Phone: [phone]   Fax:  [phone]  Occupational Therapy Treatment  Patient Details  Name: Jaime Hall MRN: [id number] Date of Birth: [birth year] Referring Provider: Sindy Guadeloupe, MD   Encounter Date: 04/22/2017  OT End of Session - 04/22/17 1633    Visit Number  4    Number of Visits  36    Date for OT Re-Evaluation  07/07/17    OT Start Time  0200    OT Stop Time  0250    OT Time Calculation (min)  50 min    Activity Tolerance  Patient tolerated treatment well;No increased pain    Behavior During Therapy  WFL for tasks assessed/performed       Past Medical History:  Diagnosis Date  . Anxiety   . Cervicalgia   . Coronary artery disease    a. 02/2012 Stress echo: severe anterior wall ischemia;  b. 02/2012 Cath/PCI: LAD 95p (3.0 x 15 Xience EX DES), D1 90ost (PTCA - bifurcational dzs), EF 45% with anterior HK;  b. 02/2013 Ex MV: fixed anterior defect w/ minor reversibility, nl EF-->Med Rx.  . Endometrial cancer ([place])    a. 07/2016 s/p robotic hysterectomy, BSO w/ washings, sentinel node inj, mapping, bx, adhesiolysis.  . Essential hypertension, benign   . Fibrocystic breast disease   . GERD (gastroesophageal reflux disease)   . Gestational hypertension   . Heart murmur   . History of anemia   . History of blood transfusion   . Hodgkin's lymphoma ([place]) 2011   a. s/p radiation and chemo therapy  . Osteoarthritis   . Polycystic ovarian disease     Past Surgical History:  Procedure Laterality Date  . ABDOMINAL HYSTERECTOMY    . bladder sling    . CARDIAC CATHETERIZATION  02/2012   [facility] 1 stent place  . CERVICAL POLYPECTOMY    . CHOLECYSTECTOMY  1982  . CORONARY ANGIOPLASTY  02/2012   left/right s/p balloon  . heart stent'  2013  . kidney stent Right 2018  . LYMPH NODE BIOPSY  2011   diagnosis of hodgkins lymphoma  . transobturator sling N/A 2009    [place]    There were no vitals filed for this visit.  Subjective Assessment - 04/22/17 1630    Subjective   Pt returns for OT  visit 4/36 to address BLE lymphedema 2/2 lymphoma and endometrial cancer treatment. Pt has no new complaints. Pt presents with knee length compression wraps in place. "Well, I tried to wrap."    Pertinent History  CVI, PAD, Ultrasound negative for DVT and + for baker's cyst; chemo-induced periferal neuropathy, endometrial ca, pelvic adhesive disease, PAD, recent weight loss reported as ~ 25-30 pounds, hx Hodgkin's lymphoma ( s/p xrt and chemo), OA, abdominal hysterectomy, oophorectomy, pelvic LN disection(pelvic/ aortic node biopsy 0/3 LN), post op infection/ vaginal cuff abcess, severe pelvic adhesive disease, , s/p chemotherapy and whole pelvic radiation    Limitations  leg swelling, L>R) , BLE pain/ discomfort, decreased sensation / neuropathic pain in feet and hands, difficulty walking, decreased standing tolerance, decreased balance w/ increased falls risk, difficulty fitting LB clothing and preferred street shoes, mildly impaired functional mobility and transferes    Patient Stated Goals  decrease leg swelling and pain.     Currently in Pain?  No/denies    Pain Onset  Other (comment) post chemo  OT Treatments/Exercises (OP) - 04/22/17 0001      ADLs   ADL Education Given  Yes      Manual Therapy   Manual Therapy  Edema management;Compression Bandaging;Manual Lymphatic Drainage (MLD)    Edema Management  skin care to LLE during MLD    Manual Lymphatic Drainage (MLD)  Manual lymph drainage in supine: short neck, left axillary nodes, superficial and deep abdominals, left inguino-axillary anastamoses, left lower extremity from toes and dorsal foot to lateral hip redirecting along pathway.    Compression Bandaging  Foot to below Knee (A-D)  LLE gradient compression wraps applied  as follows: Toe wrap omitted. . Knee length cotton  stockinett from base of toes to knee. One roll Rasidol soft foam. cumferentially from foot to tibila tuberosity w/ ~ 50% overlap; single 8 cm x 5 m  to foot and ankle, one 12 cm  x 5 m ankle to achilles origin...all applied circumferentially in custommary layered gradient configuration. Fitted w/ 3 x non-slip sock as Pt able to fit shoe over wraps.              OT Education - 04/22/17 1632    Education provided  Yes    Education Details  emphasis of LE se;f care training oon self compression wrapping using gradient   technique    Person(s) Educated  Patient    Methods  Explanation;Demonstration    Comprehension  Returned demonstration;Verbalized understanding          OT Long Term Goals - 04/08/17 1346      OT LONG TERM GOAL #1   Title  Pt independent w/ lymphedema precautions and prevention principals and strategies to limit LE progression and infection risk.    Baseline  dependent    Time  2    Period  Weeks    Status  New    Target Date  04/26/17      OT LONG TERM GOAL #2   Title  Lymphedema (LE) management/ self-care: Pt able to apply thigh length, multi layered, gradient compression wraps with modified independence ( with extra time)  using proper techniques within 2 weeks to achieve optimal limb volume reductions in preparation for fitting compression garments/ devices bilaterally.    Baseline  dependent    Time  2    Period  Weeks    Status  New    Target Date  04/26/17      OT LONG TERM GOAL #3   Title  Lymphedema (LE) management/ self-care:  Pt to achieve at least 10% LLE limb volume reductions  in the full LLE and 5% limb volume reduction in the RLE during Intensive Phase CDT to limit LE progression, to reduce pain, to improve ADLs performance.    Baseline  Max A    Time  12    Period  Weeks    Status  New    Target Date  07/11/17      OT LONG TERM GOAL #4   Title  Lymphedema (LE) management/ self-care:  Pt to tolerate daily compression wraps, compression  garments and/ or HOS devices in keeping w/ prescribed wear regime within 1 week of issue date of each to progress and retain clinical and functional gains and to limit LE progression.    Baseline  Max A    Time  12    Period  Weeks    Status  New    Target Date  07/11/17      OT  LONG TERM GOAL #5   Title  Lymphedema (LE) management/ self-care:  During Management Phase CDT Pt to sustain current limb volumes within 5%, and all other clinical gains achieved during OT treatment WITH MAXIMUM CAREGIVER ASSISTANCE  to control leg and foot swelling, to  limit LE progression, to dec4rease infection risk and to limit further functional decline.    Baseline  dependent    Time  6    Period  Months    Status  New    Target Date  10/09/17            Plan - 04/22/17 1633    Clinical Impression Statement  Pt did a pretty good job applying compression wraps herself during visit interval. Although they were not perfectly applied as taught last visit, they were well enough applied to provide a nociable decrease in limb volume today. Pt pleased and motivated with results . Pt tolerated MLD without difficulty to LLE, LLQ. Pt was engaged in edu for  compression wrapping. Cont as per POC.    Occupational performance deficits (Please refer to evaluation for details):  ADL's;IADL's;Rest and Sleep;Work;Leisure;Social Participation;Other body image    Rehab Potential  Good    OT Frequency  3x / week       Patient will benefit from skilled therapeutic intervention in order to improve the following deficits and impairments:     Visit Diagnosis: Lymphedema, not elsewhere classified    Problem List Patient Active Problem List   Diagnosis Date Noted  . Cerumen impaction 01/12/2017  . Chemotherapy-induced peripheral neuropathy ([place]) 12/10/2016  . 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 SIRS (systemic inflammatory response syndrome) ([place]) 08/05/2016  . Endometrial cancer ([place])   . Pelvic adhesive disease    . Lymphedema 07/20/2016  . Chronic venous insufficiency 07/20/2016  . PAD (peripheral artery disease) ([place]) 07/20/2016  . Postmenopausal bleeding 07/14/2016  . Influenza 07/14/2016  . Class 1 obesity due to excess calories without serious comorbidity with body mass index (BMI) of 34.0 to 34.9 in adult 05/27/2016  . Chronic cough 12/19/2015  . Pre-syncope 12/19/2015  . Hx of colonic polyps   . Benign neoplasm of sigmoid colon   . Dysphagia, pharyngoesophageal phase 10/01/2014  . Prolapsed, uterovaginal, incomplete 06/24/2014  . Routine general medical examination at a health care facility 12/30/2012  . Obesity (BMI 30-39.9) 12/30/2012  . Hx of multiple pulmonary nodules 12/28/2012  . Plantar fasciitis of right foot 12/28/2012  . 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 Edema of both legs 09/12/2012  . Hip pain, bilateral 09/12/2012  . History of Hodgkin's lymphoma 09/12/2012  . Coronary artery disease   . Hyperlipidemia   . Chest pain on exertion 02/29/2012    Andrey Spearman, MS, OTR/L, [facility] 04/22/17 4:37 PM  [place] MAIN [facility] SERVICES [address] [place], [place], [address] Phone: [phone]   Fax:  [phone]  Name: AMILIANA FOUTZ MRN: [id number] Date of Birth: 13-Oct-1950

## 2017-04-26 ENCOUNTER — Ambulatory Visit: Payer: PPO | Admitting: Occupational Therapy

## 2017-04-26 DIAGNOSIS — I890 Lymphedema, not elsewhere classified: Secondary | ICD-10-CM

## 2017-04-26 NOTE — Therapy (Signed)
[place] MAIN [facility] SERVICES [address] [place], [place], [address] Phone: [phone]   Fax:  [phone]  Occupational Therapy Treatment  Patient Details  Name: ANALESE SOVINE MRN: [id number] Date of Birth: 28-May-1951 Referring Provider: Sindy Guadeloupe, MD   Encounter Date: 04/26/2017  OT End of Session - 04/26/17 1645    Visit Number  5    Number of Visits  36    Date for OT Re-Evaluation  07/07/17    OT Start Time  0204    OT Stop Time  0302    OT Time Calculation (min)  58 min    Activity Tolerance  Patient tolerated treatment well;No increased pain    Behavior During Therapy  WFL for tasks assessed/performed       Past Medical History:  Diagnosis Date  . Anxiety   . Cervicalgia   . Coronary artery disease    a. 02/2012 Stress echo: severe anterior wall ischemia;  b. 02/2012 Cath/PCI: LAD 95p (3.0 x 15 Xience EX DES), D1 90ost (PTCA - bifurcational dzs), EF 45% with anterior HK;  b. 02/2013 Ex MV: fixed anterior defect w/ minor reversibility, nl EF-->Med Rx.  . Endometrial cancer ([place])    a. 07/2016 s/p robotic hysterectomy, BSO w/ washings, sentinel node inj, mapping, bx, adhesiolysis.  . Essential hypertension, benign   . Fibrocystic breast disease   . GERD (gastroesophageal reflux disease)   . Gestational hypertension   . Heart murmur   . History of anemia   . History of blood transfusion   . Hodgkin's lymphoma ([place]) 2011   a. s/p radiation and chemo therapy  . Osteoarthritis   . Polycystic ovarian disease     Past Surgical History:  Procedure Laterality Date  . ABDOMINAL HYSTERECTOMY    . bladder sling    . CARDIAC CATHETERIZATION  02/2012   [facility] 1 stent place  . CERVICAL POLYPECTOMY    . CHOLECYSTECTOMY  1982  . CORONARY ANGIOPLASTY  02/2012   left/right s/p balloon  . heart stent'  2013  . kidney stent Right 2018  . LYMPH NODE BIOPSY  2011   diagnosis of hodgkins lymphoma  . transobturator sling N/A 2009   [place]    There were no vitals filed for this visit.  Subjective Assessment - 04/26/17 1410    Subjective   Pt returns for OT  visit 5/36 to address BLE lymphedema 2/2 lymphoma and endometrial cancer treatment. Pt has no new complaints. Pt reports no difficulty applying compression wraps over the weekend.  (Pended)     Pertinent History  CVI, PAD, Ultrasound negative for DVT and + for baker's cyst; chemo-induced periferal neuropathy, endometrial ca, pelvic adhesive disease, PAD, recent weight loss reported as ~ 25-30 pounds, hx Hodgkin's lymphoma ( s/p xrt and chemo), OA, abdominal hysterectomy, oophorectomy, pelvic LN disection(pelvic/ aortic node biopsy 0/3 LN), post op infection/ vaginal cuff abcess, severe pelvic adhesive disease, , s/p chemotherapy and whole pelvic radiation  (Pended)     Limitations  leg swelling, L>R) , BLE pain/ discomfort, decreased sensation / neuropathic pain in feet and hands, difficulty walking, decreased standing tolerance, decreased balance w/ increased falls risk, difficulty fitting LB clothing and preferred street shoes, mildly impaired functional mobility and transferes  (Pended)     Patient Stated Goals  decrease leg swelling and pain.   (Pended)     Currently in Pain?  No/denies  (Pended)     Pain Onset  Other (comment)  (  Pended)  post chemo                   OT Treatments/Exercises (OP) - 04/26/17 0001      ADLs   ADL Education Given  Yes      Manual Therapy   Manual Therapy  Edema management;Compression Bandaging;Manual Lymphatic Drainage (MLD)    Edema Management  skin care to LLE during MLD    Manual Lymphatic Drainage (MLD)  Manual lymph drainage in supine: short neck, left axillary nodes, superficial and deep abdominals, left inguino-axillary anastamoses, left lower extremity from toes and dorsal foot to lateral hip redirecting along pathway.    Compression Bandaging  Foot to below Knee (A-D)  LLE gradient compression wraps applied   as follows: Toe wrap omitted. . Knee length cotton stockinett from base of toes to knee. One roll Rasidol soft foam. cumferentially from foot to tibila tuberosity w/ ~ 50% overlap; single 8 cm x 5 m  to foot and ankle, one 12 cm  x 5 m ankle to achilles origin...all applied circumferentially in custommary layered gradient configuration. Fitted w/ 3 x non-slip sock as Pt able to fit shoe over wraps.                   OT Long Term Goals - 04/08/17 1346      OT LONG TERM GOAL #1   Title  Pt independent w/ lymphedema precautions and prevention principals and strategies to limit LE progression and infection risk.    Baseline  dependent    Time  2    Period  Weeks    Status  New    Target Date  04/26/17      OT LONG TERM GOAL #2   Title  Lymphedema (LE) management/ self-care: Pt able to apply thigh length, multi layered, gradient compression wraps with modified independence ( with extra time)  using proper techniques within 2 weeks to achieve optimal limb volume reductions in preparation for fitting compression garments/ devices bilaterally.    Baseline  dependent    Time  2    Period  Weeks    Status  New    Target Date  04/26/17      OT LONG TERM GOAL #3   Title  Lymphedema (LE) management/ self-care:  Pt to achieve at least 10% LLE limb volume reductions  in the full LLE and 5% limb volume reduction in the RLE during Intensive Phase CDT to limit LE progression, to reduce pain, to improve ADLs performance.    Baseline  Max A    Time  12    Period  Weeks    Status  New    Target Date  07/11/17      OT LONG TERM GOAL #4   Title  Lymphedema (LE) management/ self-care:  Pt to tolerate daily compression wraps, compression garments and/ or HOS devices in keeping w/ prescribed wear regime within 1 week of issue date of each to progress and retain clinical and functional gains and to limit LE progression.    Baseline  Max A    Time  12    Period  Weeks    Status  New    Target Date   07/11/17      OT LONG TERM GOAL #5   Title  Lymphedema (LE) management/ self-care:  During Management Phase CDT Pt to sustain current limb volumes within 5%, and all other clinical gains achieved during OT treatment WITH MAXIMUM  CAREGIVER ASSISTANCE  to control leg and foot swelling, to  limit LE progression, to dec4rease infection risk and to limit further functional decline.    Baseline  dependent    Time  6    Period  Months    Status  New    Target Date  10/09/17            Plan - 04/26/17 1646    Clinical Impression Statement  LLE continues to respond with visible volume reduction and softening of tissue density at each visit. pT HAS MASTERED COMPRESSION WRAPPING AT HOME. pT TOLERATED ALL ASPECTS OF cdt IN CLINIC TODAY, INCLUDING SKIN CARE, lle mld AND COMPRESSION WRAPPING. pT AGREES W/ PLAN TO COMPLETE VOLUMETRIC MEASUREMENTS AT END OF WEEK VISIT TO MEASURE PROGRESS TOWARDS VOLUME REDUCTION GOAL.    Occupational performance deficits (Please refer to evaluation for details):  ADL's;IADL's;Rest and Sleep;Work;Leisure;Social Participation    Rehab Potential  Good    OT Frequency  3x / week       Patient will benefit from skilled therapeutic intervention in order to improve the following deficits and impairments:     Visit Diagnosis: Lymphedema, not elsewhere classified    Problem List Patient Active Problem List   Diagnosis Date Noted  . Cerumen impaction 01/12/2017  . Chemotherapy-induced peripheral neuropathy ([place]) 12/10/2016  . 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 SIRS (systemic inflammatory response syndrome) ([place]) 08/05/2016  . Endometrial cancer ([place])   . Pelvic adhesive disease   . Lymphedema 07/20/2016  . Chronic venous insufficiency 07/20/2016  . PAD (peripheral artery disease) ([place]) 07/20/2016  . Postmenopausal bleeding 07/14/2016  . Influenza 07/14/2016  . Class 1 obesity due to excess calories without serious comorbidity with body mass index (BMI) of 34.0  to 34.9 in adult 05/27/2016  . Chronic cough 12/19/2015  . Pre-syncope 12/19/2015  . Hx of colonic polyps   . Benign neoplasm of sigmoid colon   . Dysphagia, pharyngoesophageal phase 10/01/2014  . Prolapsed, uterovaginal, incomplete 06/24/2014  . Routine general medical examination at a health care facility 12/30/2012  . Obesity (BMI 30-39.9) 12/30/2012  . Hx of multiple pulmonary nodules 12/28/2012  . Plantar fasciitis of right foot 12/28/2012  . 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 Edema of both legs 09/12/2012  . Hip pain, bilateral 09/12/2012  . History of Hodgkin's lymphoma 09/12/2012  . Coronary artery disease   . Hyperlipidemia   . Chest pain on exertion 02/29/2012    Andrey Spearman, MS, OTR/L, [facility] 04/26/17 4:49 PM  [place] MAIN [facility] SERVICES [address] [place], [place], [address] Phone: [phone]   Fax:  [phone]  Name: RENDY LAZARD MRN: [id number] Date of Birth: 07-03-1950

## 2017-04-29 ENCOUNTER — Ambulatory Visit: Payer: PPO | Admitting: Occupational Therapy

## 2017-04-29 DIAGNOSIS — I890 Lymphedema, not elsewhere classified: Secondary | ICD-10-CM

## 2017-04-29 NOTE — Therapy (Signed)
[place] MAIN [facility] SERVICES [address] [place], [place], [address] Phone: [phone]   Fax:  [phone]  Occupational Therapy Treatment  Patient Details  Name: [place] Hall MRN: [id number] Date of Birth: 1950/08/06 Referring Provider: Sindy Guadeloupe, MD   Encounter Date: 04/29/2017  OT End of Session - 04/29/17 1607    Visit Number  6    Number of Visits  36    Date for OT Re-Evaluation  07/07/17    OT Start Time  0207    OT Stop Time  0255    OT Time Calculation (min)  48 min    Activity Tolerance  Patient tolerated treatment well;No increased pain    Behavior During Therapy  WFL for tasks assessed/performed       Past Medical History:  Diagnosis Date  . Anxiety   . Cervicalgia   . Coronary artery disease    a. 02/2012 Stress echo: severe anterior wall ischemia;  b. 02/2012 Cath/PCI: LAD 95p (3.0 x 15 Xience EX DES), D1 90ost (PTCA - bifurcational dzs), EF 45% with anterior HK;  b. 02/2013 Ex MV: fixed anterior defect w/ minor reversibility, nl EF-->Med Rx.  . Endometrial cancer ([place])    a. 07/2016 s/p robotic hysterectomy, BSO w/ washings, sentinel node inj, mapping, bx, adhesiolysis.  . Essential hypertension, benign   . Fibrocystic breast disease   . GERD (gastroesophageal reflux disease)   . Gestational hypertension   . Heart murmur   . History of anemia   . History of blood transfusion   . Hodgkin's lymphoma ([place]) 2011   a. s/p radiation and chemo therapy  . Osteoarthritis   . Polycystic ovarian disease     Past Surgical History:  Procedure Laterality Date  . ABDOMINAL HYSTERECTOMY    . bladder sling    . CARDIAC CATHETERIZATION  02/2012   [facility] 1 stent place  . CERVICAL POLYPECTOMY    . CHOLECYSTECTOMY  1982  . COLONOSCOPY WITH PROPOFOL N/A 02/05/2015   Procedure: COLONOSCOPY WITH PROPOFOL;  Surgeon: Lucilla Lame, MD;  Location: [facility] ENDOSCOPY;  Service: Endoscopy;  Laterality: N/A;  . CORONARY ANGIOPLASTY   02/2012   left/right s/p balloon  . CYSTOGRAM N/A 08/17/2016   Procedure: CYSTOGRAM;  Surgeon: Hollice Espy, MD;  Location: [facility] ORS;  Service: Urology;  Laterality: N/A;  . CYSTOSCOPY N/A 08/17/2016   Procedure: CYSTOSCOPY EXAM UNDER ANESTHESIA;  Surgeon: Hollice Espy, MD;  Location: [facility] ORS;  Service: Urology;  Laterality: N/A;  . CYSTOSCOPY W/ RETROGRADES Bilateral 08/17/2016   Procedure: CYSTOSCOPY WITH RETROGRADE PYELOGRAM;  Surgeon: Hollice Espy, MD;  Location: [facility] ORS;  Service: Urology;  Laterality: Bilateral;  . CYSTOSCOPY WITH STENT PLACEMENT Right 08/17/2016   Procedure: CYSTOSCOPY WITH STENT PLACEMENT;  Surgeon: Hollice Espy, MD;  Location: [facility] ORS;  Service: Urology;  Laterality: Right;  . heart stent'  2013  . kidney stent Right 2018  . LYMPH NODE BIOPSY  2011   diagnosis of hodgkins lymphoma  . PELVIC LYMPH NODE DISSECTION N/A 07/29/2016   Procedure: PELVIC/AORTIC LYMPH NODE SAMPLING;  Surgeon: [name], MD;  Location: [facility] ORS;  Service: Gynecology;  Laterality: N/A;  . PORTA CATH INSERTION N/A 09/22/2016   Procedure: Glori Luis Cath Insertion;  Surgeon: [name], MD;  Location: [place] CV LAB;  Service: Cardiovascular;  Laterality: N/A;  . PORTA CATH REMOVAL N/A 11/17/2016   Procedure: Glori Luis Cath Removal;  Surgeon: [name], MD;  Location: [facility] INVASIVE CV  LAB;  Service: Cardiovascular;  Laterality: N/A;  . ROBOTIC ASSISTED TOTAL HYSTERECTOMY WITH BILATERAL SALPINGO OOPHERECTOMY N/A 07/29/2016   Procedure: ROBOTIC ASSISTED TOTAL HYSTERECTOMY WITH BILATERAL SALPINGO OOPHORECTOMY;  Surgeon: [name], MD;  Location: [facility] ORS;  Service: Gynecology;  Laterality: N/A;  . SENTINEL NODE BIOPSY N/A 07/29/2016   Procedure: SENTINEL NODE BIOPSY;  Surgeon: [name], MD;  Location: [facility] ORS;  Service: Gynecology;  Laterality: N/A;  . transobturator sling N/A 2009   [place]    There were no vitals filed for this  visit.  Subjective Assessment - 04/29/17 1606    Subjective   Pt returns for OT  visit 6/36 to address BLE lymphedema 2/2 lymphoma and endometrial cancer treatment. Pt has no new complaints. Pt reporting fatigue today after work.    Pertinent History  CVI, PAD, Ultrasound negative for DVT and + for baker's cyst; chemo-induced periferal neuropathy, endometrial ca, pelvic adhesive disease, PAD, recent weight loss reported as ~ 25-30 pounds, hx Hodgkin's lymphoma ( s/p xrt and chemo), OA, abdominal hysterectomy, oophorectomy, pelvic LN disection(pelvic/ aortic node biopsy 0/3 LN), post op infection/ vaginal cuff abcess, severe pelvic adhesive disease, , s/p chemotherapy and whole pelvic radiation    Limitations  leg swelling, L>R) , BLE pain/ discomfort, decreased sensation / neuropathic pain in feet and hands, difficulty walking, decreased standing tolerance, decreased balance w/ increased falls risk, difficulty fitting LB clothing and preferred street shoes, mildly impaired functional mobility and transferes    Patient Stated Goals  decrease leg swelling and pain.     Currently in Pain?  No/denies    Pain Onset  Other (comment) post chemo                   OT Treatments/Exercises (OP) - 04/29/17 0001      ADLs   ADL Education Given  Yes      Manual Therapy   Manual Therapy  Edema management;Compression Bandaging;Manual Lymphatic Drainage (MLD)    Edema Management  skin care to LLE during MLD    Manual Lymphatic Drainage (MLD)  Manual lymph drainage in supine: short neck, left axillary nodes, superficial and deep abdominals, left inguino-axillary anastamoses, left lower extremity from toes and dorsal foot to lateral hip redirecting along pathway.    Compression Bandaging  Foot to below Knee (A-D)  LLE gradient compression wraps applied  as follows: Toe wrap omitted. . Knee length cotton stockinett from base of toes to knee. One roll Rasidol soft foam. cumferentially from foot to tibila  tuberosity w/ ~ 50% overlap; single 8 cm x 5 m  to foot and ankle, one 12 cm  x 5 m ankle to achilles origin...all applied circumferentially in custommary layered gradient configuration. Fitted w/ 3 x non-slip sock as Pt able to fit shoe over wraps.              OT Education - 04/29/17 1607    Education provided  Yes    Education Details  Continued skilled Pt/caregiver education  And LE ADL training throughout visit for lymphedema self care/ home program, including compression wrapping, compression garment and device wear/care, lymphatic pumping ther ex, simple self-MLD, and skin care. Discussed progress towards goals.    Person(s) Educated  Patient    Methods  Explanation;Demonstration    Comprehension  Verbalized understanding;Returned demonstration          OT Long Term Goals - 04/08/17 1346      OT LONG TERM GOAL #1  Title  Pt independent w/ lymphedema precautions and prevention principals and strategies to limit LE progression and infection risk.    Baseline  dependent    Time  2    Period  Weeks    Status  New    Target Date  04/26/17      OT LONG TERM GOAL #2   Title  Lymphedema (LE) management/ self-care: Pt able to apply thigh length, multi layered, gradient compression wraps with modified independence ( with extra time)  using proper techniques within 2 weeks to achieve optimal limb volume reductions in preparation for fitting compression garments/ devices bilaterally.    Baseline  dependent    Time  2    Period  Weeks    Status  New    Target Date  04/26/17      OT LONG TERM GOAL #3   Title  Lymphedema (LE) management/ self-care:  Pt to achieve at least 10% LLE limb volume reductions  in the full LLE and 5% limb volume reduction in the RLE during Intensive Phase CDT to limit LE progression, to reduce pain, to improve ADLs performance.    Baseline  Max A    Time  12    Period  Weeks    Status  New    Target Date  07/11/17      OT LONG TERM GOAL #4   Title   Lymphedema (LE) management/ self-care:  Pt to tolerate daily compression wraps, compression garments and/ or HOS devices in keeping w/ prescribed wear regime within 1 week of issue date of each to progress and retain clinical and functional gains and to limit LE progression.    Baseline  Max A    Time  12    Period  Weeks    Status  New    Target Date  07/11/17      OT LONG TERM GOAL #5   Title  Lymphedema (LE) management/ self-care:  During Management Phase CDT Pt to sustain current limb volumes within 5%, and all other clinical gains achieved during OT treatment WITH MAXIMUM CAREGIVER ASSISTANCE  to control leg and foot swelling, to  limit LE progression, to dec4rease infection risk and to limit further functional decline.    Baseline  dependent    Time  6    Period  Months    Status  New    Target Date  10/09/17            Plan - 04/29/17 1608    Clinical Impression Statement  Pt tolerated MLDF, skin care and compression wrapping below the L knee today without difficulty. Less emphasis on Pt edu for self care in keeping w/ Pt's fatigue level. Pt agrees w/ plan to complete volumetrics next session to measure progress towards volumetric reduction goal. Cont as per POC    Occupational performance deficits (Please refer to evaluation for details):  ADL's;IADL's;Rest and Sleep;Work;Leisure;Social Participation;Other body image    Rehab Potential  Good    OT Frequency  3x / week       Patient will benefit from skilled therapeutic intervention in order to improve the following deficits and impairments:     Visit Diagnosis: Lymphedema, not elsewhere classified    Problem List Patient Active Problem List   Diagnosis Date Noted  . Cerumen impaction 01/12/2017  . Chemotherapy-induced peripheral neuropathy ([place]) 12/10/2016  . 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 SIRS (systemic inflammatory response syndrome) ([place]) 08/05/2016  . Endometrial cancer ([place])   .  Pelvic adhesive disease    . Lymphedema 07/20/2016  . Chronic venous insufficiency 07/20/2016  . PAD (peripheral artery disease) ([place]) 07/20/2016  . Postmenopausal bleeding 07/14/2016  . Influenza 07/14/2016  . Class 1 obesity due to excess calories without serious comorbidity with body mass index (BMI) of 34.0 to 34.9 in adult 05/27/2016  . Chronic cough 12/19/2015  . Pre-syncope 12/19/2015  . Hx of colonic polyps   . Benign neoplasm of sigmoid colon   . Dysphagia, pharyngoesophageal phase 10/01/2014  . Prolapsed, uterovaginal, incomplete 06/24/2014  . Routine general medical examination at a health care facility 12/30/2012  . Obesity (BMI 30-39.9) 12/30/2012  . Hx of multiple pulmonary nodules 12/28/2012  . Plantar fasciitis of right foot 12/28/2012  . 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 Edema of both legs 09/12/2012  . Hip pain, bilateral 09/12/2012  . History of Hodgkin's lymphoma 09/12/2012  . Coronary artery disease   . Hyperlipidemia   . Chest pain on exertion 02/29/2012    Andrey Spearman, MS, OTR/L, [facility] 04/29/17 4:11 PM  [place] MAIN [facility] SERVICES [address] [place], [place], [address] Phone: [phone]   Fax:  [phone]  Name: KELLA SPLINTER MRN: [id number] Date of Birth: [birth year]

## 2017-05-03 ENCOUNTER — Ambulatory Visit: Payer: PPO | Admitting: Occupational Therapy

## 2017-05-03 DIAGNOSIS — I890 Lymphedema, not elsewhere classified: Secondary | ICD-10-CM | POA: Diagnosis not present

## 2017-05-03 NOTE — Therapy (Signed)
[place] MAIN [facility] SERVICES [address] [place], [place], [address] Phone: [phone]   Fax:  [phone]  Occupational Therapy Treatment  Patient Details  Name: Jaime Hall MRN: [id number] Date of Birth: 01-25-1951 Referring Provider: Sindy Guadeloupe, MD   Encounter Date: 05/03/2017  OT End of Session - 05/03/17 1515    Visit Number  7    Number of Visits  36    Date for OT Re-Evaluation  07/07/17    OT Start Time  0215    OT Stop Time  0303    OT Time Calculation (min)  48 min    Activity Tolerance  Patient tolerated treatment well;No increased pain    Behavior During Therapy  WFL for tasks assessed/performed       Past Medical History:  Diagnosis Date  . Anxiety   . Cervicalgia   . Coronary artery disease    a. 02/2012 Stress echo: severe anterior wall ischemia;  b. 02/2012 Cath/PCI: LAD 95p (3.0 x 15 Xience EX DES), D1 90ost (PTCA - bifurcational dzs), EF 45% with anterior HK;  b. 02/2013 Ex MV: fixed anterior defect w/ minor reversibility, nl EF-->Med Rx.  . Endometrial cancer ([place])    a. 07/2016 s/p robotic hysterectomy, BSO w/ washings, sentinel node inj, mapping, bx, adhesiolysis.  . Essential hypertension, benign   . Fibrocystic breast disease   . GERD (gastroesophageal reflux disease)   . Gestational hypertension   . Heart murmur   . History of anemia   . History of blood transfusion   . Hodgkin's lymphoma ([place]) 2011   a. s/p radiation and chemo therapy  . Osteoarthritis   . Polycystic ovarian disease     Past Surgical History:  Procedure Laterality Date  . ABDOMINAL HYSTERECTOMY    . bladder sling    . CARDIAC CATHETERIZATION  02/2012   [facility] 1 stent place  . CERVICAL POLYPECTOMY    . CHOLECYSTECTOMY  1982  . COLONOSCOPY WITH PROPOFOL N/A 02/05/2015   Performed by Lucilla Lame, MD at [facility]  . CORONARY ANGIOPLASTY  02/2012   left/right s/p balloon  . CYSTOGRAM N/A 08/17/2016   Performed by Hollice Espy, MD at [facility] ORS  . CYSTOSCOPY EXAM UNDER ANESTHESIA N/A 08/17/2016   Performed by Hollice Espy, MD at [facility] ORS  . CYSTOSCOPY WITH RETROGRADE PYELOGRAM Bilateral 08/17/2016   Performed by Hollice Espy, MD at [facility] ORS  . CYSTOSCOPY WITH STENT PLACEMENT Right 08/17/2016   Performed by Hollice Espy, MD at [facility] ORS  . heart stent'  2013  . kidney stent Right 2018  . LYMPH NODE BIOPSY  2011   diagnosis of hodgkins lymphoma  . PELVIC/AORTIC LYMPH NODE SAMPLING N/A 07/29/2016   Performed by [name], MD at [facility] ORS  . Porta Cath Insertion N/A 09/22/2016   Performed by [name], MD at [place] CV LAB  . Porta Cath Removal N/A 11/17/2016   Performed by [name], MD at [place] CV LAB  . ROBOTIC ASSISTED TOTAL HYSTERECTOMY WITH BILATERAL SALPINGO OOPHORECTOMY N/A 07/29/2016   Performed by [name], MD at [facility] ORS  . SENTINEL NODE BIOPSY N/A 07/29/2016   Performed by [name], MD at [facility] ORS  . transobturator sling N/A 2009   [place]    There were no vitals filed for this visit.  Subjective Assessment - 05/03/17 1509    Subjective   Pt presents for OT  visit  7/36 to address BLE lymphedema 2/2 lymphoma and endometrial cancer treatment. Pt reports , "I was on my feet a lot all weekend and I had to work extra time on my feet today. When asked if she was wrapped  , Pt  reports that she was not.    Pertinent History  CVI, PAD, Ultrasound negative for DVT and + for baker's cyst; chemo-induced periferal neuropathy, endometrial ca, pelvic adhesive disease, PAD, recent weight loss reported as ~ 25-30 pounds, hx Hodgkin's lymphoma ( s/p xrt and chemo), OA, abdominal hysterectomy, oophorectomy, pelvic LN disection(pelvic/ aortic node biopsy 0/3 LN), post op infection/ vaginal cuff abcess, severe pelvic adhesive disease, , s/p chemotherapy and whole pelvic radiation    Limitations  leg swelling, L>R) , BLE pain/ discomfort,  decreased sensation / neuropathic pain in feet and hands, difficulty walking, decreased standing tolerance, decreased balance w/ increased falls risk, difficulty fitting LB clothing and preferred street shoes, mildly impaired functional mobility and transferes    Patient Stated Goals  decrease leg swelling and pain.     Currently in Pain?  No/denies    Pain Onset  Other (comment) post chemo          LYMPHEDEMA/ONCOLOGY QUESTIONNAIRE - 05/03/17 1512      Left Lower Extremity Lymphedema   Other  LLE toes to knee (A-D)  Limb volume measures 3904.96 ml. LLE knee to groin (E-G) volume = 7887.76 ml. LLE full leg-ankle to groin (A-G)  measures 11792.71 ml.    Other  LLE A-D volume is decreased by 0.5% since initially measured on 04/12/2017. E-G volume is increased by 10.97%, and full leg (A-G) volume is increased by 6.89% overall.               OT Treatments/Exercises (OP) - 05/03/17 0001      ADLs   ADL Education Given  Yes      Manual Therapy   Manual Therapy  Edema management;Compression Bandaging;Manual Lymphatic Drainage (MLD)    Manual therapy comments  LLE comparative limb volumetrics    Edema Management  skin care to LLE during MLD    Manual Lymphatic Drainage (MLD)  No MLD today 2/2 time constraionts    Compression Bandaging  Foot to below Knee (A-D)  LLE gradient compression wraps applied  as follows: Toe wrap omitted. . Knee length cotton stockinett from base of toes to knee. One roll Rasidol soft foam. cumferentially from foot to tibila tuberosity w/ ~ 50% overlap; single 8 cm x 5 m  to foot and ankle, one 12 cm  x 5 m ankle to achilles origin...all applied circumferentially in custommary layered gradient configuration. Fitted w/ 3 x non-slip sock as Pt able to fit shoe over wraps.              OT Education - 05/03/17 1513    Education provided  Yes    Education Details  ZCont Pt edu for LE sel;f care throughout session. Discussed importance of compliance with 23/7  daytime compression wraps during Intensive Phase of CDT for positive clinical response to treatment.    Person(s) Educated  Patient    Methods  Explanation;Demonstration    Comprehension  Verbalized understanding;Returned demonstration          OT Long Term Goals - 04/08/17 1346      OT LONG TERM GOAL #1   Title  Pt independent w/ lymphedema precautions and prevention principals and strategies to limit LE progression and infection risk.  Baseline  dependent    Time  2    Period  Weeks    Status  New    Target Date  04/26/17      OT LONG TERM GOAL #2   Title  Lymphedema (LE) management/ self-care: Pt able to apply thigh length, multi layered, gradient compression wraps with modified independence ( with extra time)  using proper techniques within 2 weeks to achieve optimal limb volume reductions in preparation for fitting compression garments/ devices bilaterally.    Baseline  dependent    Time  2    Period  Weeks    Status  New    Target Date  04/26/17      OT LONG TERM GOAL #3   Title  Lymphedema (LE) management/ self-care:  Pt to achieve at least 10% LLE limb volume reductions  in the full LLE and 5% limb volume reduction in the RLE during Intensive Phase CDT to limit LE progression, to reduce pain, to improve ADLs performance.    Baseline  Max A    Time  12    Period  Weeks    Status  New    Target Date  07/11/17      OT LONG TERM GOAL #4   Title  Lymphedema (LE) management/ self-care:  Pt to tolerate daily compression wraps, compression garments and/ or HOS devices in keeping w/ prescribed wear regime within 1 week of issue date of each to progress and retain clinical and functional gains and to limit LE progression.    Baseline  Max A    Time  12    Period  Weeks    Status  New    Target Date  07/11/17      OT LONG TERM GOAL #5   Title  Lymphedema (LE) management/ self-care:  During Management Phase CDT Pt to sustain current limb volumes within 5%, and all other  clinical gains achieved during OT treatment WITH MAXIMUM CAREGIVER ASSISTANCE  to control leg and foot swelling, to  limit LE progression, to dec4rease infection risk and to limit further functional decline.    Baseline  dependent    Time  6    Period  Months    Status  New    Target Date  10/09/17            Plan - 05/03/17 1518    Clinical Impression Statement  Comparative limb volumetrics reveal nearly immeasurable decrease in limb volume below the knee in the LE affected LLE., while both thigh and full leg both present with increased limb volumes compared with initial volumetrics measured and calculated on 04/12/2017. Discussed this very limited progress with patient and reiterated that poor compliance with compression will result in ongoing increases in limb volume .  By palpation, however, LLE has felt less congested overall.  Pt tolerated skin care and knee length LLE compression wrapping only today as time constraints limited MLD.    Occupational performance deficits (Please refer to evaluation for details):  ADL's;IADL's;Rest and Sleep;Work;Leisure;Social Participation    Rehab Potential  Good    OT Frequency  3x / week       Patient will benefit from skilled therapeutic intervention in order to improve the following deficits and impairments:     Visit Diagnosis: Lymphedema, not elsewhere classified    Problem List Patient Active Problem List   Diagnosis Date Noted  . Cerumen impaction 01/12/2017  . Chemotherapy-induced peripheral neuropathy ([place]) 12/10/2016  . 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 SIRS (systemic inflammatory response syndrome) ([place]) 08/05/2016  . Endometrial cancer ([place])   . Pelvic adhesive disease   . Lymphedema 07/20/2016  . Chronic venous insufficiency 07/20/2016  . PAD (peripheral artery disease) ([place]) 07/20/2016  . Postmenopausal bleeding 07/14/2016  . Influenza 07/14/2016  . Class 1 obesity due to excess calories without serious comorbidity  with body mass index (BMI) of 34.0 to 34.9 in adult 05/27/2016  . Chronic cough 12/19/2015  . Pre-syncope 12/19/2015  . Hx of colonic polyps   . Benign neoplasm of sigmoid colon   . Dysphagia, pharyngoesophageal phase 10/01/2014  . Prolapsed, uterovaginal, incomplete 06/24/2014  . Routine general medical examination at a health care facility 12/30/2012  . Obesity (BMI 30-39.9) 12/30/2012  . Hx of multiple pulmonary nodules 12/28/2012  . Plantar fasciitis of right foot 12/28/2012  . 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 Edema of both legs 09/12/2012  . Hip pain, bilateral 09/12/2012  . History of Hodgkin's lymphoma 09/12/2012  . Coronary artery disease   . Hyperlipidemia   . Chest pain on exertion 02/29/2012    Andrey Spearman, MS, OTR/L, [facility] 05/03/17 3:25 PM  [place] MAIN [facility] SERVICES [address] [place], [place], [address] Phone: [phone]   Fax:  [phone]  Name: Jaime Hall MRN: [id number] Date of Birth: [birth year]

## 2017-05-10 ENCOUNTER — Ambulatory Visit: Payer: PPO | Admitting: Occupational Therapy

## 2017-05-10 DIAGNOSIS — I890 Lymphedema, not elsewhere classified: Secondary | ICD-10-CM

## 2017-05-10 NOTE — Therapy (Signed)
[place] MAIN [facility] SERVICES [address] [place], [place], [address] Phone: [phone]   Fax:  [phone]  Occupational Therapy Treatment  Patient Details  Name: Jaime Hall MRN: [id number] Date of Birth: [birth year] Referring Provider: Sindy Guadeloupe, MD   Encounter Date: 05/10/2017  OT End of Session - 05/10/17 1640    Visit Number  8    Number of Visits  36    Date for OT Re-Evaluation  07/07/17    OT Start Time  0200    OT Stop Time  0300    OT Time Calculation (min)  60 min    Activity Tolerance  Patient tolerated treatment well;No increased pain    Behavior During Therapy  WFL for tasks assessed/performed       Past Medical History:  Diagnosis Date  . Anxiety   . Cervicalgia   . Coronary artery disease    a. 02/2012 Stress echo: severe anterior wall ischemia;  b. 02/2012 Cath/PCI: LAD 95p (3.0 x 15 Xience EX DES), D1 90ost (PTCA - bifurcational dzs), EF 45% with anterior HK;  b. 02/2013 Ex MV: fixed anterior defect w/ minor reversibility, nl EF-->Med Rx.  . Endometrial cancer ([place])    a. 07/2016 s/p robotic hysterectomy, BSO w/ washings, sentinel node inj, mapping, bx, adhesiolysis.  . Essential hypertension, benign   . Fibrocystic breast disease   . GERD (gastroesophageal reflux disease)   . Gestational hypertension   . Heart murmur   . History of anemia   . History of blood transfusion   . Hodgkin's lymphoma ([place]) 2011   a. s/p radiation and chemo therapy  . Osteoarthritis   . Polycystic ovarian disease     Past Surgical History:  Procedure Laterality Date  . ABDOMINAL HYSTERECTOMY    . bladder sling    . CARDIAC CATHETERIZATION  02/2012   [facility] 1 stent place  . CERVICAL POLYPECTOMY    . CHOLECYSTECTOMY  1982  . COLONOSCOPY WITH PROPOFOL N/A 02/05/2015   Procedure: COLONOSCOPY WITH PROPOFOL;  Surgeon: Lucilla Lame, MD;  Location: [facility] ENDOSCOPY;  Service: Endoscopy;  Laterality: N/A;  . CORONARY ANGIOPLASTY   02/2012   left/right s/p balloon  . CYSTOGRAM N/A 08/17/2016   Procedure: CYSTOGRAM;  Surgeon: Hollice Espy, MD;  Location: [facility] ORS;  Service: Urology;  Laterality: N/A;  . CYSTOSCOPY N/A 08/17/2016   Procedure: CYSTOSCOPY EXAM UNDER ANESTHESIA;  Surgeon: Hollice Espy, MD;  Location: [facility] ORS;  Service: Urology;  Laterality: N/A;  . CYSTOSCOPY W/ RETROGRADES Bilateral 08/17/2016   Procedure: CYSTOSCOPY WITH RETROGRADE PYELOGRAM;  Surgeon: Hollice Espy, MD;  Location: [facility] ORS;  Service: Urology;  Laterality: Bilateral;  . CYSTOSCOPY WITH STENT PLACEMENT Right 08/17/2016   Procedure: CYSTOSCOPY WITH STENT PLACEMENT;  Surgeon: Hollice Espy, MD;  Location: [facility] ORS;  Service: Urology;  Laterality: Right;  . heart stent'  2013  . kidney stent Right 2018  . LYMPH NODE BIOPSY  2011   diagnosis of hodgkins lymphoma  . PELVIC LYMPH NODE DISSECTION N/A 07/29/2016   Procedure: PELVIC/AORTIC LYMPH NODE SAMPLING;  Surgeon: [name], MD;  Location: [facility] ORS;  Service: Gynecology;  Laterality: N/A;  . PORTA CATH INSERTION N/A 09/22/2016   Procedure: Glori Luis Cath Insertion;  Surgeon: [name], MD;  Location: [place] CV LAB;  Service: Cardiovascular;  Laterality: N/A;  . PORTA CATH REMOVAL N/A 11/17/2016   Procedure: Glori Luis Cath Removal;  Surgeon: [name], MD;  Location: [facility] INVASIVE CV  LAB;  Service: Cardiovascular;  Laterality: N/A;  . ROBOTIC ASSISTED TOTAL HYSTERECTOMY WITH BILATERAL SALPINGO OOPHERECTOMY N/A 07/29/2016   Procedure: ROBOTIC ASSISTED TOTAL HYSTERECTOMY WITH BILATERAL SALPINGO OOPHORECTOMY;  Surgeon: [name], MD;  Location: [facility] ORS;  Service: Gynecology;  Laterality: N/A;  . SENTINEL NODE BIOPSY N/A 07/29/2016   Procedure: SENTINEL NODE BIOPSY;  Surgeon: [name], MD;  Location: [facility] ORS;  Service: Gynecology;  Laterality: N/A;  . transobturator sling N/A 2009   [place]    There were no vitals filed for this  visit.  Subjective Assessment - 05/10/17 1638    Subjective   Pt presents for OT  visit 8/36 to address BLE lymphedema 2/2 lymphoma and endometrial cancer treatment. Pt reports , Pt reports 4 layer wrap applied after last session caused crapming in her lower leg and she has to remove it soon after she arrived home.    Pertinent History  CVI, PAD, Ultrasound negative for DVT and + for baker's cyst; chemo-induced periferal neuropathy, endometrial ca, pelvic adhesive disease, PAD, recent weight loss reported as ~ 25-30 pounds, hx Hodgkin's lymphoma ( s/p xrt and chemo), OA, abdominal hysterectomy, oophorectomy, pelvic LN disection(pelvic/ aortic node biopsy 0/3 LN), post op infection/ vaginal cuff abcess, severe pelvic adhesive disease, , s/p chemotherapy and whole pelvic radiation    Limitations  leg swelling, L>R) , BLE pain/ discomfort, decreased sensation / neuropathic pain in feet and hands, difficulty walking, decreased standing tolerance, decreased balance w/ increased falls risk, difficulty fitting LB clothing and preferred street shoes, mildly impaired functional mobility and transferes    Patient Stated Goals  decrease leg swelling and pain.     Currently in Pain?  No/denies    Pain Onset  Other (comment) post chemo                   OT Treatments/Exercises (OP) - 05/10/17 0001      ADLs   ADL Education Given  Yes      Manual Therapy   Manual Therapy  Edema management;Compression Bandaging;Manual Lymphatic Drainage (MLD)    Manual therapy comments  LLE comparative limb volumetrics    Edema Management  skin care to LLE during MLD    Manual Lymphatic Drainage (MLD)  No MLD today 2/2 time constraionts    Compression Bandaging  Foot to below Knee (A-D)  LLE gradient compression wraps applied  as follows: Toe wrap omitted. . Knee length cotton stockinett from base of toes to knee. One roll Rasidol soft foam. cumferentially from foot to tibila tuberosity w/ ~ 50% overlap; single 8  cm x 5 m  to foot and ankle, one 12 cm  x 5 m ankle to achilles origin...all applied circumferentially in custommary layered gradient configuration. Fitted w/ 3 x non-slip sock as Pt able to fit shoe over wraps.              OT Education - 05/10/17 1639    Education provided  Yes    Education Details  Pt edu for Le self care throughout session    Person(s) Educated  Patient    Methods  Explanation;Demonstration    Comprehension  Verbalized understanding;Returned demonstration          OT Long Term Goals - 04/08/17 1346      OT LONG TERM GOAL #1   Title  Pt independent w/ lymphedema precautions and prevention principals and strategies to limit LE progression and infection risk.    Baseline  dependent  Time  2    Period  Weeks    Status  New    Target Date  04/26/17      OT LONG TERM GOAL #2   Title  Lymphedema (LE) management/ self-care: Pt able to apply thigh length, multi layered, gradient compression wraps with modified independence ( with extra time)  using proper techniques within 2 weeks to achieve optimal limb volume reductions in preparation for fitting compression garments/ devices bilaterally.    Baseline  dependent    Time  2    Period  Weeks    Status  New    Target Date  04/26/17      OT LONG TERM GOAL #3   Title  Lymphedema (LE) management/ self-care:  Pt to achieve at least 10% LLE limb volume reductions  in the full LLE and 5% limb volume reduction in the RLE during Intensive Phase CDT to limit LE progression, to reduce pain, to improve ADLs performance.    Baseline  Max A    Time  12    Period  Weeks    Status  New    Target Date  07/11/17      OT LONG TERM GOAL #4   Title  Lymphedema (LE) management/ self-care:  Pt to tolerate daily compression wraps, compression garments and/ or HOS devices in keeping w/ prescribed wear regime within 1 week of issue date of each to progress and retain clinical and functional gains and to limit LE progression.     Baseline  Max A    Time  12    Period  Weeks    Status  New    Target Date  07/11/17      OT LONG TERM GOAL #5   Title  Lymphedema (LE) management/ self-care:  During Management Phase CDT Pt to sustain current limb volumes within 5%, and all other clinical gains achieved during OT treatment WITH MAXIMUM CAREGIVER ASSISTANCE  to control leg and foot swelling, to  limit LE progression, to dec4rease infection risk and to limit further functional decline.    Baseline  dependent    Time  6    Period  Months    Status  New    Target Date  10/09/17            Plan - 05/10/17 1640    Clinical Impression Statement  Pt tolerated all aspects of manual CDT today. Reduced palpable tissue density noted in LLE below the knee  after several days of holiday rest with    less dependent positioning at work. Resumed regular circumferential compression wrapping pattern and DCd herrigngbone pattern as Pt tolerated the extra compression poorly. Cont as per POC. Will repeat limb volumetrics at the end of the week. as Pt is anxious to obtain  compression garments and  complete wrapping.    Occupational performance deficits (Please refer to evaluation for details):  ADL's;Rest and Sleep;Work;Leisure;Social Participation    Rehab Potential  Good    OT Frequency  3x / week       Patient will benefit from skilled therapeutic intervention in order to improve the following deficits and impairments:     Visit Diagnosis: Lymphedema, not elsewhere classified    Problem List Patient Active Problem List   Diagnosis Date Noted  . Cerumen impaction 01/12/2017  . Chemotherapy-induced peripheral neuropathy ([place]) 12/10/2016  . 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 SIRS (systemic inflammatory response syndrome) ([place]) 08/05/2016  . Endometrial cancer ([place])   .  Pelvic adhesive disease   . Lymphedema 07/20/2016  . Chronic venous insufficiency 07/20/2016  . PAD (peripheral artery disease) ([place]) 07/20/2016  .  Postmenopausal bleeding 07/14/2016  . Influenza 07/14/2016  . Class 1 obesity due to excess calories without serious comorbidity with body mass index (BMI) of 34.0 to 34.9 in adult 05/27/2016  . Chronic cough 12/19/2015  . Pre-syncope 12/19/2015  . Hx of colonic polyps   . Benign neoplasm of sigmoid colon   . Dysphagia, pharyngoesophageal phase 10/01/2014  . Prolapsed, uterovaginal, incomplete 06/24/2014  . Routine general medical examination at a health care facility 12/30/2012  . Obesity (BMI 30-39.9) 12/30/2012  . Hx of multiple pulmonary nodules 12/28/2012  . Plantar fasciitis of right foot 12/28/2012  . 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 Edema of both legs 09/12/2012  . Hip pain, bilateral 09/12/2012  . History of Hodgkin's lymphoma 09/12/2012  . Coronary artery disease   . Hyperlipidemia   . Chest pain on exertion 02/29/2012    Andrey Spearman, MS, OTR/L, [facility] 05/10/17 4:45 PM  [place] MAIN [facility] SERVICES [address] [place], [place], [address] Phone: [phone]   Fax:  [phone]  Name: Jaime Hall MRN: [id number] Date of Birth: 02/26/1951

## 2017-05-13 ENCOUNTER — Ambulatory Visit: Payer: PPO | Admitting: Occupational Therapy

## 2017-05-13 DIAGNOSIS — I890 Lymphedema, not elsewhere classified: Secondary | ICD-10-CM

## 2017-05-13 NOTE — Therapy (Signed)
[place] MAIN [facility] SERVICES [address] [place], [place], [address] Phone: [phone]   Fax:  [phone]  Occupational Therapy Treatment  Patient Details  Name: FRANZISKA PODGURSKI MRN: [id number] Date of Birth: July 07, 1950 Referring Provider: Sindy Guadeloupe, MD   Encounter Date: 05/13/2017  OT End of Session - 05/13/17 1529    Visit Number  9    Number of Visits  36    Date for OT Re-Evaluation  07/07/17    OT Start Time  0207    OT Stop Time  0257    OT Time Calculation (min)  50 min    Activity Tolerance  Patient tolerated treatment well;No increased pain    Behavior During Therapy  WFL for tasks assessed/performed       Past Medical History:  Diagnosis Date  . Anxiety   . Cervicalgia   . Coronary artery disease    a. 02/2012 Stress echo: severe anterior wall ischemia;  b. 02/2012 Cath/PCI: LAD 95p (3.0 x 15 Xience EX DES), D1 90ost (PTCA - bifurcational dzs), EF 45% with anterior HK;  b. 02/2013 Ex MV: fixed anterior defect w/ minor reversibility, nl EF-->Med Rx.  . Endometrial cancer ([place])    a. 07/2016 s/p robotic hysterectomy, BSO w/ washings, sentinel node inj, mapping, bx, adhesiolysis.  . Essential hypertension, benign   . Fibrocystic breast disease   . GERD (gastroesophageal reflux disease)   . Gestational hypertension   . Heart murmur   . History of anemia   . History of blood transfusion   . Hodgkin's lymphoma ([place]) 2011   a. s/p radiation and chemo therapy  . Osteoarthritis   . Polycystic ovarian disease     Past Surgical History:  Procedure Laterality Date  . ABDOMINAL HYSTERECTOMY    . bladder sling    . CARDIAC CATHETERIZATION  02/2012   [facility] 1 stent place  . CERVICAL POLYPECTOMY    . CHOLECYSTECTOMY  1982  . COLONOSCOPY WITH PROPOFOL N/A 02/05/2015   Procedure: COLONOSCOPY WITH PROPOFOL;  Surgeon: Lucilla Lame, MD;  Location: [facility] ENDOSCOPY;  Service: Endoscopy;  Laterality: N/A;  . CORONARY ANGIOPLASTY   02/2012   left/right s/p balloon  . CYSTOGRAM N/A 08/17/2016   Procedure: CYSTOGRAM;  Surgeon: Hollice Espy, MD;  Location: [facility] ORS;  Service: Urology;  Laterality: N/A;  . CYSTOSCOPY N/A 08/17/2016   Procedure: CYSTOSCOPY EXAM UNDER ANESTHESIA;  Surgeon: Hollice Espy, MD;  Location: [facility] ORS;  Service: Urology;  Laterality: N/A;  . CYSTOSCOPY W/ RETROGRADES Bilateral 08/17/2016   Procedure: CYSTOSCOPY WITH RETROGRADE PYELOGRAM;  Surgeon: Hollice Espy, MD;  Location: [facility] ORS;  Service: Urology;  Laterality: Bilateral;  . CYSTOSCOPY WITH STENT PLACEMENT Right 08/17/2016   Procedure: CYSTOSCOPY WITH STENT PLACEMENT;  Surgeon: Hollice Espy, MD;  Location: [facility] ORS;  Service: Urology;  Laterality: Right;  . heart stent'  2013  . kidney stent Right 2018  . LYMPH NODE BIOPSY  2011   diagnosis of hodgkins lymphoma  . PELVIC LYMPH NODE DISSECTION N/A 07/29/2016   Procedure: PELVIC/AORTIC LYMPH NODE SAMPLING;  Surgeon: [name], MD;  Location: [facility] ORS;  Service: Gynecology;  Laterality: N/A;  . PORTA CATH INSERTION N/A 09/22/2016   Procedure: Glori Luis Cath Insertion;  Surgeon: [name], MD;  Location: [place] CV LAB;  Service: Cardiovascular;  Laterality: N/A;  . PORTA CATH REMOVAL N/A 11/17/2016   Procedure: Glori Luis Cath Removal;  Surgeon: [name], MD;  Location: [facility] INVASIVE CV  LAB;  Service: Cardiovascular;  Laterality: N/A;  . ROBOTIC ASSISTED TOTAL HYSTERECTOMY WITH BILATERAL SALPINGO OOPHERECTOMY N/A 07/29/2016   Procedure: ROBOTIC ASSISTED TOTAL HYSTERECTOMY WITH BILATERAL SALPINGO OOPHORECTOMY;  Surgeon: [name], MD;  Location: [facility] ORS;  Service: Gynecology;  Laterality: N/A;  . SENTINEL NODE BIOPSY N/A 07/29/2016   Procedure: SENTINEL NODE BIOPSY;  Surgeon: [name], MD;  Location: [facility] ORS;  Service: Gynecology;  Laterality: N/A;  . transobturator sling N/A 2009   [place]    There were no vitals filed for this  visit.  Subjective Assessment - 05/13/17 1512    Subjective   Pt presents for OT  visit 9/36 to address BLE lymphedema 2/2 lymphoma and endometrial cancer treatment. Pt reports she was on her feet all day at work today. "I wrapped my leg."    Pertinent History  CVI, PAD, Ultrasound negative for DVT and + for baker's cyst; chemo-induced periferal neuropathy, endometrial ca, pelvic adhesive disease, PAD, recent weight loss reported as ~ 25-30 pounds, hx Hodgkin's lymphoma ( s/p xrt and chemo), OA, abdominal hysterectomy, oophorectomy, pelvic LN disection(pelvic/ aortic node biopsy 0/3 LN), post op infection/ vaginal cuff abcess, severe pelvic adhesive disease, , s/p chemotherapy and whole pelvic radiation    Limitations  leg swelling, L>R) , BLE pain/ discomfort, decreased sensation / neuropathic pain in feet and hands, difficulty walking, decreased standing tolerance, decreased balance w/ increased falls risk, difficulty fitting LB clothing and preferred street shoes, mildly impaired functional mobility and transferes    Patient Stated Goals  decrease leg swelling and pain.     Currently in Pain?  No/denies    Pain Onset  Other (comment) post chemo          LYMPHEDEMA/ONCOLOGY QUESTIONNAIRE - 05/13/17 1521      Left Lower Extremity Lymphedema   Other  LLE toes to knee (A-D)  Limb volume measures 3392.80 ml. LLE knee to groin (E-G) volume = 8877.89 ml. LLE full leg-ankle to groin (A-G)  measures 12266.69 ml.              OT Treatments/Exercises (OP) - 05/13/17 0001      ADLs   ADL Education Given  Yes      Manual Therapy   Manual Therapy  Edema management;Compression Bandaging;Other (comment) BLE comparative limb volumetrics    Edema Management  skin care to LLE during MLD    Manual Lymphatic Drainage (MLD)  No MLD today 2/2 time constraionts    Compression Bandaging  multi laywer gradient compression wrap  from foot to groin- LLE             OT Education - 05/13/17 1513     Education provided  Yes    Education Details  Continued skilled Pt/caregiver education  And LE ADL training throughout visit for lymphedema self care/ home program, including compression wrapping, compression garment and device wear/care, lymphatic pumping ther ex, simple self-MLD, and skin care. Discussed progress towards goals.    Person(s) Educated  Patient    Methods  Explanation;Demonstration    Comprehension  Verbalized understanding;Returned demonstration          OT Long Term Goals - 04/08/17 1346      OT LONG TERM GOAL #1   Title  Pt independent w/ lymphedema precautions and prevention principals and strategies to limit LE progression and infection risk.    Baseline  dependent    Time  2    Period  Weeks    Status  New    Target Date  04/26/17      OT LONG TERM GOAL #2   Title  Lymphedema (LE) management/ self-care: Pt able to apply thigh length, multi layered, gradient compression wraps with modified independence ( with extra time)  using proper techniques within 2 weeks to achieve optimal limb volume reductions in preparation for fitting compression garments/ devices bilaterally.    Baseline  dependent    Time  2    Period  Weeks    Status  New    Target Date  04/26/17      OT LONG TERM GOAL #3   Title  Lymphedema (LE) management/ self-care:  Pt to achieve at least 10% LLE limb volume reductions  in the full LLE and 5% limb volume reduction in the RLE during Intensive Phase CDT to limit LE progression, to reduce pain, to improve ADLs performance.    Baseline  Max A    Time  12    Period  Weeks    Status  New    Target Date  07/11/17      OT LONG TERM GOAL #4   Title  Lymphedema (LE) management/ self-care:  Pt to tolerate daily compression wraps, compression garments and/ or HOS devices in keeping w/ prescribed wear regime within 1 week of issue date of each to progress and retain clinical and functional gains and to limit LE progression.    Baseline  Max A     Time  12    Period  Weeks    Status  New    Target Date  07/11/17      OT LONG TERM GOAL #5   Title  Lymphedema (LE) management/ self-care:  During Management Phase CDT Pt to sustain current limb volumes within 5%, and all other clinical gains achieved during OT treatment WITH MAXIMUM CAREGIVER ASSISTANCE  to control leg and foot swelling, to  limit LE progression, to dec4rease infection risk and to limit further functional decline.    Baseline  dependent    Time  6    Period  Months    Status  New    Target Date  10/09/17            Plan - 05/13/17 1530    Clinical Impression Statement  Completed comparative BLE limb volumetrics to measure progress towards goals. LLE A-D volume is decreased by 13.12% since last measured on 05/03/2017,  and by 13.55% since initially measured on 04/12/2017. 10% goal is met for A-D leg section with this value. E-G volume is increased by 12.55% since 11/19, and is increased by 24.%, since 11/19. Full leg (A-G) volume is increased by 4.01 % since 11/19,  and by 11.19%overall Extended gradient compression wrap from toes to groin today in effort to reduce thigh swelling and to move fluid into deep abdominal and groin lymphatics. Pt less than enthusiastic about this change in wrap regime as this  configuration is quite a bit more burdensome than lower leg wrapping. alone. Pt given permission to modify for   bowling, but otherwise encuoraged to be 100% complliant with compression until next visit.     Occupational performance deficits (Please refer to evaluation for details):  ADL's;IADL's;Rest and Sleep;Work;Leisure;Social Participation;Other body image    Rehab Potential  Good    OT Frequency  3x / week       Patient will benefit from skilled therapeutic intervention in order to improve the following deficits and impairments:  Visit Diagnosis: Lymphedema, not elsewhere classified    Problem List Patient Active Problem List   Diagnosis Date Noted  .  Cerumen impaction 01/12/2017  . Chemotherapy-induced peripheral neuropathy ([place]) 12/10/2016  . 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 SIRS (systemic inflammatory response syndrome) ([place]) 08/05/2016  . Endometrial cancer ([place])   . Pelvic adhesive disease   . Lymphedema 07/20/2016  . Chronic venous insufficiency 07/20/2016  . PAD (peripheral artery disease) ([place]) 07/20/2016  . Postmenopausal bleeding 07/14/2016  . Influenza 07/14/2016  . Class 1 obesity due to excess calories without serious comorbidity with body mass index (BMI) of 34.0 to 34.9 in adult 05/27/2016  . Chronic cough 12/19/2015  . Pre-syncope 12/19/2015  . Hx of colonic polyps   . Benign neoplasm of sigmoid colon   . Dysphagia, pharyngoesophageal phase 10/01/2014  . Prolapsed, uterovaginal, incomplete 06/24/2014  . Routine general medical examination at a health care facility 12/30/2012  . Obesity (BMI 30-39.9) 12/30/2012  . Hx of multiple pulmonary nodules 12/28/2012  . Plantar fasciitis of right foot 12/28/2012  . 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 Edema of both legs 09/12/2012  . Hip pain, bilateral 09/12/2012  . History of Hodgkin's lymphoma 09/12/2012  . Coronary artery disease   . Hyperlipidemia   . Chest pain on exertion 02/29/2012    Andrey Spearman, MS, OTR/L, [facility] 05/13/17 3:36 PM  [place] MAIN [facility] SERVICES [address] [place], [place], [address] Phone: [phone]   Fax:  [phone]  Name: NGOZI ALVIDREZ MRN: [id number] Date of Birth: 02/26/1951

## 2017-05-17 ENCOUNTER — Ambulatory Visit: Payer: PPO | Attending: Oncology | Admitting: Occupational Therapy

## 2017-05-17 DIAGNOSIS — I890 Lymphedema, not elsewhere classified: Secondary | ICD-10-CM | POA: Insufficient documentation

## 2017-05-17 NOTE — Therapy (Signed)
[place] MAIN [facility] SERVICES [address] [place], [place], [address] Phone: [phone]   Fax:  [phone]  Occupational Therapy Treatment and Progress Report  Patient Details  Name: Jaime Hall MRN: [id number] Date of Birth: 01-11-1951 Referring Provider: Sindy Guadeloupe, MD   Encounter Date: 05/17/2017  OT End of Session - 05/17/17 1514    Visit Number  10    Number of Visits  36    Date for OT Re-Evaluation  07/07/17    OT Start Time  0200    OT Stop Time  0300    OT Time Calculation (min)  60 min    Activity Tolerance  Patient tolerated treatment well;No increased pain    Behavior During Therapy  WFL for tasks assessed/performed       Past Medical History:  Diagnosis Date  . Anxiety   . Cervicalgia   . Coronary artery disease    a. 02/2012 Stress echo: severe anterior wall ischemia;  b. 02/2012 Cath/PCI: LAD 95p (3.0 x 15 Xience EX DES), D1 90ost (PTCA - bifurcational dzs), EF 45% with anterior HK;  b. 02/2013 Ex MV: fixed anterior defect w/ minor reversibility, nl EF-->Med Rx.  . Endometrial cancer ([place])    a. 07/2016 s/p robotic hysterectomy, BSO w/ washings, sentinel node inj, mapping, bx, adhesiolysis.  . Essential hypertension, benign   . Fibrocystic breast disease   . GERD (gastroesophageal reflux disease)   . Gestational hypertension   . Heart murmur   . History of anemia   . History of blood transfusion   . Hodgkin's lymphoma ([place]) 2011   a. s/p radiation and chemo therapy  . Osteoarthritis   . Polycystic ovarian disease     Past Surgical History:  Procedure Laterality Date  . ABDOMINAL HYSTERECTOMY    . bladder sling    . CARDIAC CATHETERIZATION  02/2012   [facility] 1 stent place  . CERVICAL POLYPECTOMY    . CHOLECYSTECTOMY  1982  . COLONOSCOPY WITH PROPOFOL N/A 02/05/2015   Procedure: COLONOSCOPY WITH PROPOFOL;  Surgeon: Lucilla Lame, MD;  Location: [facility] ENDOSCOPY;  Service: Endoscopy;  Laterality: N/A;  .  CORONARY ANGIOPLASTY  02/2012   left/right s/p balloon  . CYSTOGRAM N/A 08/17/2016   Procedure: CYSTOGRAM;  Surgeon: Hollice Espy, MD;  Location: [facility] ORS;  Service: Urology;  Laterality: N/A;  . CYSTOSCOPY N/A 08/17/2016   Procedure: CYSTOSCOPY EXAM UNDER ANESTHESIA;  Surgeon: Hollice Espy, MD;  Location: [facility] ORS;  Service: Urology;  Laterality: N/A;  . CYSTOSCOPY W/ RETROGRADES Bilateral 08/17/2016   Procedure: CYSTOSCOPY WITH RETROGRADE PYELOGRAM;  Surgeon: Hollice Espy, MD;  Location: [facility] ORS;  Service: Urology;  Laterality: Bilateral;  . CYSTOSCOPY WITH STENT PLACEMENT Right 08/17/2016   Procedure: CYSTOSCOPY WITH STENT PLACEMENT;  Surgeon: Hollice Espy, MD;  Location: [facility] ORS;  Service: Urology;  Laterality: Right;  . heart stent'  2013  . kidney stent Right 2018  . LYMPH NODE BIOPSY  2011   diagnosis of hodgkins lymphoma  . PELVIC LYMPH NODE DISSECTION N/A 07/29/2016   Procedure: PELVIC/AORTIC LYMPH NODE SAMPLING;  Surgeon: [name], MD;  Location: [facility] ORS;  Service: Gynecology;  Laterality: N/A;  . PORTA CATH INSERTION N/A 09/22/2016   Procedure: Glori Luis Cath Insertion;  Surgeon: [name], MD;  Location: [place] CV LAB;  Service: Cardiovascular;  Laterality: N/A;  . PORTA CATH REMOVAL N/A 11/17/2016   Procedure: Glori Luis Cath Removal;  Surgeon: [name], MD;  Location:  [place] CV LAB;  Service: Cardiovascular;  Laterality: N/A;  . ROBOTIC ASSISTED TOTAL HYSTERECTOMY WITH BILATERAL SALPINGO OOPHERECTOMY N/A 07/29/2016   Procedure: ROBOTIC ASSISTED TOTAL HYSTERECTOMY WITH BILATERAL SALPINGO OOPHORECTOMY;  Surgeon: [name], MD;  Location: [facility] ORS;  Service: Gynecology;  Laterality: N/A;  . SENTINEL NODE BIOPSY N/A 07/29/2016   Procedure: SENTINEL NODE BIOPSY;  Surgeon: [name], MD;  Location: [facility] ORS;  Service: Gynecology;  Laterality: N/A;  . transobturator sling N/A 2009   [place]    There were no vitals filed  for this visit.  Subjective Assessment - 05/17/17 1510    Subjective   Pt presents for OT  visit 10/36 to address BLE lymphedema 2/2 lymphoma and endometrial cancer treatment. Pt reports , " I got a bad cramp in my thigh after our last visit and I had to come out of those bandages. Wat causes that? I cant wrap above the knee on nights I have to bowl."    Pertinent History  CVI, PAD, Ultrasound negative for DVT and + for baker's cyst; chemo-induced periferal neuropathy, endometrial ca, pelvic adhesive disease, PAD, recent weight loss reported as ~ 25-30 pounds, hx Hodgkin's lymphoma ( s/p xrt and chemo), OA, abdominal hysterectomy, oophorectomy, pelvic LN disection(pelvic/ aortic node biopsy 0/3 LN), post op infection/ vaginal cuff abcess, severe pelvic adhesive disease, , s/p chemotherapy and whole pelvic radiation    Limitations  leg swelling, L>R) , BLE pain/ discomfort, decreased sensation / neuropathic pain in feet and hands, difficulty walking, decreased standing tolerance, decreased balance w/ increased falls risk, difficulty fitting LB clothing and preferred street shoes, mildly impaired functional mobility and transferes    Patient Stated Goals  decrease leg swelling and pain.     Currently in Pain?  No/denies    Pain Onset  Other (comment) post chemo                   OT Treatments/Exercises (OP) - 05/17/17 0001      ADLs   ADL Education Given  Yes      Manual Therapy   Manual Therapy  Edema management;Manual Lymphatic Drainage (MLD);Compression Bandaging    Edema Management  skin care to LLE during MLD    Manual Lymphatic Drainage (MLD)  MLD to LLE/LLQ utilizing deep abdominal lymphatics and inguinal - axillary anastamoses in supine and sidelying.    Compression Bandaging  gradient compression wraps below knee only.                  OT Long Term Goals - 05/17/17 1509      OT LONG TERM GOAL #1   Title  Pt independent w/ lymphedema precautions and prevention  principals and strategies to limit LE progression and infection risk.    Baseline  dependent    Time  2    Period  Weeks    Status  Achieved    Target Date  05/17/17      OT LONG TERM GOAL #2   Title  Lymphedema (LE) management/ self-care: Pt able to apply thigh length, multi layered, gradient compression wraps with modified independence ( with extra time)  using proper techniques within 2 weeks to achieve optimal limb volume reductions in preparation for fitting compression garments/ devices bilaterally.    Baseline  dependent    Time  2    Period  Weeks    Status  Achieved    Target Date  05/17/17      OT  LONG TERM GOAL #3   Title  Lymphedema (LE) management/ self-care:  Pt to achieve at least 10% LLE limb volume reductions  in the full LLE and 5% limb volume reduction in the RLE during Intensive Phase CDT to limit LE progression, to reduce pain, to improve ADLs performance.    Baseline  Max A    Time  12    Period  Weeks    Status  Partially Met      OT LONG TERM GOAL #4   Title  Lymphedema (LE) management/ self-care:  Pt to tolerate daily compression wraps, compression garments and/ or HOS devices in keeping w/ prescribed wear regime within 1 week of issue date of each to progress and retain clinical and functional gains and to limit LE progression.    Baseline  Max A    Time  12    Period  Weeks    Status  Partially Met      OT LONG TERM GOAL #5   Title  Lymphedema (LE) management/ self-care:  During Management Phase CDT Pt to sustain current limb volumes within 5%, and all other clinical gains achieved during OT treatment WITH MAXIMUM CAREGIVER ASSISTANCE  to control leg and foot swelling, to  limit LE progression, to dec4rease infection risk and to limit further functional decline.    Baseline  dependent    Time  6    Period  Months    Status  On-going            Plan - 26-May-2017 1514    Clinical Impression Statement  Discussed w Pt progress towards all LE self care  goals and planned trategies for going forward. Reviewed LE precautions and Pt has met goal. Pt has mastered compression wrapping techniques, but is ~ 50% compliant w/ wrapping during visit interval. Limb volume goal was met below the knee on the LLE, but 10% reduction goal for full leg is not yet met.  We've been wrapping full leg to address this, but pt has difficulty with full leg wrap as work uniform does not fit well over it andwraps interfere with mobility during leisure activities. Pt feeling discouraged re progress thus far, but she verbalizes understanding that she is not yet ready to be fit with compression garments. Cont as per POC for frequenct and OT duration.    Occupational performance deficits (Please refer to evaluation for details):  ADL's;IADL's;Rest and Sleep;Work;Leisure;Social Participation;Other;Play body image    Rehab Potential  Good    OT Frequency  3x / week       Patient will benefit from skilled therapeutic intervention in order to improve the following deficits and impairments:     Visit Diagnosis: Lymphedema, not elsewhere classified  G-Codes - May 26, 2017 1510    Functional Assessment Tool Used (Outpatient only)  chart review, interview, observation, physical examination, comparative limb volumetrics    Functional Limitation  Self care    Self Care Current Status (G2563)  At least 60 percent but less than 80 percent impaired, limited or restricted    Self Care Goal Status (S9373)  At least 1 percent but less than 20 percent impaired, limited or restricted       Problem List Patient Active Problem List   Diagnosis Date Noted  . Cerumen impaction 01/12/2017  . Chemotherapy-induced peripheral neuropathy ([place]) 12/10/2016  . 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 SIRS (systemic inflammatory response syndrome) ([place]) 08/05/2016  . Endometrial cancer ([place])   . Pelvic adhesive disease   .  Lymphedema 07/20/2016  . Chronic venous insufficiency 07/20/2016  . PAD  (peripheral artery disease) ([place]) 07/20/2016  . Postmenopausal bleeding 07/14/2016  . Influenza 07/14/2016  . Class 1 obesity due to excess calories without serious comorbidity with body mass index (BMI) of 34.0 to 34.9 in adult 05/27/2016  . Chronic cough 12/19/2015  . Pre-syncope 12/19/2015  . Hx of colonic polyps   . Benign neoplasm of sigmoid colon   . Dysphagia, pharyngoesophageal phase 10/01/2014  . Prolapsed, uterovaginal, incomplete 06/24/2014  . Routine general medical examination at a health care facility 12/30/2012  . Obesity (BMI 30-39.9) 12/30/2012  . Hx of multiple pulmonary nodules 12/28/2012  . Plantar fasciitis of right foot 12/28/2012  . 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 Edema of both legs 09/12/2012  . Hip pain, bilateral 09/12/2012  . History of Hodgkin's lymphoma 09/12/2012  . Coronary artery disease   . Hyperlipidemia   . Chest pain on exertion 02/29/2012    Andrey Spearman, MS, OTR/L, [facility] 05/17/17 3:30 PM  [place] MAIN [facility] SERVICES [address] [place], [place], [address] Phone: [phone]   Fax:  [phone]  Name: Jaime Hall MRN: [id number] Date of Birth: December 18, 1950

## 2017-05-20 ENCOUNTER — Ambulatory Visit: Payer: PPO | Admitting: Occupational Therapy

## 2017-05-20 DIAGNOSIS — I890 Lymphedema, not elsewhere classified: Secondary | ICD-10-CM

## 2017-05-20 NOTE — Therapy (Signed)
[place] MAIN [facility] SERVICES [address] [place], [place], [address] Phone: [phone]   Fax:  [phone]  Occupational Therapy Treatment  Patient Details  Name: Jaime Hall MRN: [id number] Date of Birth: 04/16/1951 Referring Provider: Sindy Guadeloupe, MD   Encounter Date: 05/20/2017  OT End of Session - 05/20/17 1625    Visit Number  11    Number of Visits  36    Date for OT Re-Evaluation  07/07/17    OT Start Time  0200    OT Stop Time  0303    OT Time Calculation (min)  63 min    Activity Tolerance  Patient tolerated treatment well;No increased pain    Behavior During Therapy  WFL for tasks assessed/performed       Past Medical History:  Diagnosis Date  . Anxiety   . Cervicalgia   . Coronary artery disease    a. 02/2012 Stress echo: severe anterior wall ischemia;  b. 02/2012 Cath/PCI: LAD 95p (3.0 x 15 Xience EX DES), D1 90ost (PTCA - bifurcational dzs), EF 45% with anterior HK;  b. 02/2013 Ex MV: fixed anterior defect w/ minor reversibility, nl EF-->Med Rx.  . Endometrial cancer ([place])    a. 07/2016 s/p robotic hysterectomy, BSO w/ washings, sentinel node inj, mapping, bx, adhesiolysis.  . Essential hypertension, benign   . Fibrocystic breast disease   . GERD (gastroesophageal reflux disease)   . Gestational hypertension   . Heart murmur   . History of anemia   . History of blood transfusion   . Hodgkin's lymphoma ([place]) 2011   a. s/p radiation and chemo therapy  . Osteoarthritis   . Polycystic ovarian disease     Past Surgical History:  Procedure Laterality Date  . ABDOMINAL HYSTERECTOMY    . bladder sling    . CARDIAC CATHETERIZATION  02/2012   [facility] 1 stent place  . CERVICAL POLYPECTOMY    . CHOLECYSTECTOMY  1982  . COLONOSCOPY WITH PROPOFOL N/A 02/05/2015   Procedure: COLONOSCOPY WITH PROPOFOL;  Surgeon: Lucilla Lame, MD;  Location: [facility] ENDOSCOPY;  Service: Endoscopy;  Laterality: N/A;  . CORONARY ANGIOPLASTY   02/2012   left/right s/p balloon  . CYSTOGRAM N/A 08/17/2016   Procedure: CYSTOGRAM;  Surgeon: Hollice Espy, MD;  Location: [facility] ORS;  Service: Urology;  Laterality: N/A;  . CYSTOSCOPY N/A 08/17/2016   Procedure: CYSTOSCOPY EXAM UNDER ANESTHESIA;  Surgeon: Hollice Espy, MD;  Location: [facility] ORS;  Service: Urology;  Laterality: N/A;  . CYSTOSCOPY W/ RETROGRADES Bilateral 08/17/2016   Procedure: CYSTOSCOPY WITH RETROGRADE PYELOGRAM;  Surgeon: Hollice Espy, MD;  Location: [facility] ORS;  Service: Urology;  Laterality: Bilateral;  . CYSTOSCOPY WITH STENT PLACEMENT Right 08/17/2016   Procedure: CYSTOSCOPY WITH STENT PLACEMENT;  Surgeon: Hollice Espy, MD;  Location: [facility] ORS;  Service: Urology;  Laterality: Right;  . heart stent'  2013  . kidney stent Right 2018  . LYMPH NODE BIOPSY  2011   diagnosis of hodgkins lymphoma  . PELVIC LYMPH NODE DISSECTION N/A 07/29/2016   Procedure: PELVIC/AORTIC LYMPH NODE SAMPLING;  Surgeon: [name], MD;  Location: [facility] ORS;  Service: Gynecology;  Laterality: N/A;  . PORTA CATH INSERTION N/A 09/22/2016   Procedure: Glori Luis Cath Insertion;  Surgeon: [name], MD;  Location: [place] CV LAB;  Service: Cardiovascular;  Laterality: N/A;  . PORTA CATH REMOVAL N/A 11/17/2016   Procedure: Glori Luis Cath Removal;  Surgeon: [name], MD;  Location: [facility] INVASIVE CV  LAB;  Service: Cardiovascular;  Laterality: N/A;  . ROBOTIC ASSISTED TOTAL HYSTERECTOMY WITH BILATERAL SALPINGO OOPHERECTOMY N/A 07/29/2016   Procedure: ROBOTIC ASSISTED TOTAL HYSTERECTOMY WITH BILATERAL SALPINGO OOPHORECTOMY;  Surgeon: [name], MD;  Location: [facility] ORS;  Service: Gynecology;  Laterality: N/A;  . SENTINEL NODE BIOPSY N/A 07/29/2016   Procedure: SENTINEL NODE BIOPSY;  Surgeon: [name], MD;  Location: [facility] ORS;  Service: Gynecology;  Laterality: N/A;  . transobturator sling N/A 2009   [place]    There were no vitals filed for this  visit.  Subjective Assessment - 05/20/17 1624    Subjective   Pt presents for OT  visit 11/36 to address BLE lymphedema 2/2 lymphoma and endometrial cancer treatment. Pt reports  she was able to keep wraps on until following afternoon ( >24 hrs) after last OT session.    Pertinent History  CVI, PAD, Ultrasound negative for DVT and + for baker's cyst; chemo-induced periferal neuropathy, endometrial ca, pelvic adhesive disease, PAD, recent weight loss reported as ~ 25-30 pounds, hx Hodgkin's lymphoma ( s/p xrt and chemo), OA, abdominal hysterectomy, oophorectomy, pelvic LN disection(pelvic/ aortic node biopsy 0/3 LN), post op infection/ vaginal cuff abcess, severe pelvic adhesive disease, , s/p chemotherapy and whole pelvic radiation    Limitations  leg swelling, L>R) , BLE pain/ discomfort, decreased sensation / neuropathic pain in feet and hands, difficulty walking, decreased standing tolerance, decreased balance w/ increased falls risk, difficulty fitting LB clothing and preferred street shoes, mildly impaired functional mobility and transferes    Patient Stated Goals  decrease leg swelling and pain.     Currently in Pain?  Yes chronic pain unchanged    Pain Onset  Other (comment) post chemo                   OT Treatments/Exercises (OP) - 05/20/17 0001      ADLs   ADL Education Given  Yes      Manual Therapy   Manual Therapy  Edema management;Manual Lymphatic Drainage (MLD);Compression Bandaging    Edema Management  skin care to LLE during MLD    Manual Lymphatic Drainage (MLD)  MLD to LLE/LLQ utilizing deep abdominal lymphatics and inguinal - axillary anastamoses in supine and sidelying.    Compression Bandaging  gradient compression wraps below knee only.             OT Education - 05/20/17 1623    Education provided  Yes    Education Details  Continued skilled Pt/caregiver education  And LE ADL training throughout visit for lymphedema self care/ home program,  including compression wrapping, compression garment and device wear/care, lymphatic pumping ther ex, simple self-MLD, and skin care. Discussed progress towards goals.    Person(s) Educated  Patient;Caregiver(s)    Methods  Explanation;Demonstration;Tactile cues;Verbal cues;Handout    Comprehension  Verbalized understanding;Returned demonstration;Verbal cues required;Tactile cues required          OT Long Term Goals - 05/17/17 1509      OT LONG TERM GOAL #1   Title  Pt independent w/ lymphedema precautions and prevention principals and strategies to limit LE progression and infection risk.    Baseline  dependent    Time  2    Period  Weeks    Status  Achieved    Target Date  05/17/17      OT LONG TERM GOAL #2   Title  Lymphedema (LE) management/ self-care: Pt able to apply thigh length, multi layered, gradient  compression wraps with modified independence ( with extra time)  using proper techniques within 2 weeks to achieve optimal limb volume reductions in preparation for fitting compression garments/ devices bilaterally.    Baseline  dependent    Time  2    Period  Weeks    Status  Achieved    Target Date  05/17/17      OT LONG TERM GOAL #3   Title  Lymphedema (LE) management/ self-care:  Pt to achieve at least 10% LLE limb volume reductions  in the full LLE and 5% limb volume reduction in the RLE during Intensive Phase CDT to limit LE progression, to reduce pain, to improve ADLs performance.    Baseline  Max A    Time  12    Period  Weeks    Status  Partially Met      OT LONG TERM GOAL #4   Title  Lymphedema (LE) management/ self-care:  Pt to tolerate daily compression wraps, compression garments and/ or HOS devices in keeping w/ prescribed wear regime within 1 week of issue date of each to progress and retain clinical and functional gains and to limit LE progression.    Baseline  Max A    Time  12    Period  Weeks    Status  Partially Met      OT LONG TERM GOAL #5   Title   Lymphedema (LE) management/ self-care:  During Management Phase CDT Pt to sustain current limb volumes within 5%, and all other clinical gains achieved during OT treatment WITH MAXIMUM CAREGIVER ASSISTANCE  to control leg and foot swelling, to  limit LE progression, to dec4rease infection risk and to limit further functional decline.    Baseline  dependent    Time  6    Period  Months    Status  On-going            Plan - 05/20/17 1627    Clinical Impression Statement  Pt tolerated all aspects of manual therapy for CDT to LLE today. LLE is palpably less dense above and below knee, but volume reduction is not visible . Pt tolerated wraps to groin today despite plan to bowl to night. Pt contnues to strive to improve compliance.    Occupational performance deficits (Please refer to evaluation for details):  ADL's;IADL's;Rest and Sleep;Work;Leisure;Social Participation;Other;Play body image    Rehab Potential  Good    OT Frequency  3x / week       Patient will benefit from skilled therapeutic intervention in order to improve the following deficits and impairments:     Visit Diagnosis: Lymphedema, not elsewhere classified    Problem List Patient Active Problem List   Diagnosis Date Noted  . Cerumen impaction 01/12/2017  . Chemotherapy-induced peripheral neuropathy ([place]) 12/10/2016  . 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 SIRS (systemic inflammatory response syndrome) ([place]) 08/05/2016  . Endometrial cancer ([place])   . Pelvic adhesive disease   . Lymphedema 07/20/2016  . Chronic venous insufficiency 07/20/2016  . PAD (peripheral artery disease) ([place]) 07/20/2016  . Postmenopausal bleeding 07/14/2016  . Influenza 07/14/2016  . Class 1 obesity due to excess calories without serious comorbidity with body mass index (BMI) of 34.0 to 34.9 in adult 05/27/2016  . Chronic cough 12/19/2015  . Pre-syncope 12/19/2015  . Hx of colonic polyps   . Benign neoplasm of sigmoid colon   .  Dysphagia, pharyngoesophageal phase 10/01/2014  . Prolapsed, uterovaginal, incomplete 06/24/2014  . Routine general medical  examination at a health care facility 12/30/2012  . Obesity (BMI 30-39.9) 12/30/2012  . Hx of multiple pulmonary nodules 12/28/2012  . Plantar fasciitis of right foot 12/28/2012  . 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 Edema of both legs 09/12/2012  . Hip pain, bilateral 09/12/2012  . History of Hodgkin's lymphoma 09/12/2012  . Coronary artery disease   . Hyperlipidemia   . Chest pain on exertion 02/29/2012    Andrey Spearman, MS, OTR/L, [facility] 05/20/17 4:29 PM  [place] MAIN [facility] SERVICES [address] [place], [place], [address] Phone: [phone]   Fax:  [phone]  Name: AYLLA HUFFINE MRN: [id number] Date of Birth: [birth year]

## 2017-05-24 ENCOUNTER — Encounter: Payer: PPO | Admitting: Occupational Therapy

## 2017-05-25 ENCOUNTER — Encounter: Payer: PPO | Admitting: Occupational Therapy

## 2017-05-27 ENCOUNTER — Ambulatory Visit: Payer: PPO | Admitting: Occupational Therapy

## 2017-05-27 DIAGNOSIS — I890 Lymphedema, not elsewhere classified: Secondary | ICD-10-CM | POA: Diagnosis not present

## 2017-05-27 NOTE — Therapy (Signed)
[place] MAIN [facility] SERVICES [address] [place], [place], [address] Phone: [phone]   Fax:  [phone]  Occupational Therapy Treatment  Patient Details  Name: Jaime Hall MRN: [id number] Date of Birth: [birth year] Referring Provider (Historical): Sindy Guadeloupe, MD   Encounter Date: 05/27/2017  OT End of Session - 05/27/17 1523    Visit Number  12    Number of Visits  36    Date for OT Re-Evaluation  07/07/17    OT Start Time  0200    OT Stop Time  0300    OT Time Calculation (min)  60 min    Activity Tolerance  Patient tolerated treatment well;No increased pain    Behavior During Therapy  WFL for tasks assessed/performed       Past Medical History:  Diagnosis Date  . Anxiety   . Cervicalgia   . Coronary artery disease    a. 02/2012 Stress echo: severe anterior wall ischemia;  b. 02/2012 Cath/PCI: LAD 95p (3.0 x 15 Xience EX DES), D1 90ost (PTCA - bifurcational dzs), EF 45% with anterior HK;  b. 02/2013 Ex MV: fixed anterior defect w/ minor reversibility, nl EF-->Med Rx.  . Endometrial cancer ([place])    a. 07/2016 s/p robotic hysterectomy, BSO w/ washings, sentinel node inj, mapping, bx, adhesiolysis.  . Essential hypertension, benign   . Fibrocystic breast disease   . GERD (gastroesophageal reflux disease)   . Gestational hypertension   . Heart murmur   . History of anemia   . History of blood transfusion   . Hodgkin's lymphoma ([place]) 2011   a. s/p radiation and chemo therapy  . Osteoarthritis   . Polycystic ovarian disease     Past Surgical History:  Procedure Laterality Date  . ABDOMINAL HYSTERECTOMY    . bladder sling    . CARDIAC CATHETERIZATION  02/2012   [facility] 1 stent place  . CERVICAL POLYPECTOMY    . CHOLECYSTECTOMY  1982  . COLONOSCOPY WITH PROPOFOL N/A 02/05/2015   Procedure: COLONOSCOPY WITH PROPOFOL;  Surgeon: Lucilla Lame, MD;  Location: [facility] ENDOSCOPY;  Service: Endoscopy;  Laterality: N/A;  . CORONARY  ANGIOPLASTY  02/2012   left/right s/p balloon  . CYSTOGRAM N/A 08/17/2016   Procedure: CYSTOGRAM;  Surgeon: Hollice Espy, MD;  Location: [facility] ORS;  Service: Urology;  Laterality: N/A;  . CYSTOSCOPY N/A 08/17/2016   Procedure: CYSTOSCOPY EXAM UNDER ANESTHESIA;  Surgeon: Hollice Espy, MD;  Location: [facility] ORS;  Service: Urology;  Laterality: N/A;  . CYSTOSCOPY W/ RETROGRADES Bilateral 08/17/2016   Procedure: CYSTOSCOPY WITH RETROGRADE PYELOGRAM;  Surgeon: Hollice Espy, MD;  Location: [facility] ORS;  Service: Urology;  Laterality: Bilateral;  . CYSTOSCOPY WITH STENT PLACEMENT Right 08/17/2016   Procedure: CYSTOSCOPY WITH STENT PLACEMENT;  Surgeon: Hollice Espy, MD;  Location: [facility] ORS;  Service: Urology;  Laterality: Right;  . heart stent'  2013  . kidney stent Right 2018  . LYMPH NODE BIOPSY  2011   diagnosis of hodgkins lymphoma  . PELVIC LYMPH NODE DISSECTION N/A 07/29/2016   Procedure: PELVIC/AORTIC LYMPH NODE SAMPLING;  Surgeon: [name], MD;  Location: [facility] ORS;  Service: Gynecology;  Laterality: N/A;  . PORTA CATH INSERTION N/A 09/22/2016   Procedure: Glori Luis Cath Insertion;  Surgeon: [name], MD;  Location: [place] CV LAB;  Service: Cardiovascular;  Laterality: N/A;  . PORTA CATH REMOVAL N/A 11/17/2016   Procedure: Glori Luis Cath Removal;  Surgeon: [name], MD;  Location: [place]  CV LAB;  Service: Cardiovascular;  Laterality: N/A;  . ROBOTIC ASSISTED TOTAL HYSTERECTOMY WITH BILATERAL SALPINGO OOPHERECTOMY N/A 07/29/2016   Procedure: ROBOTIC ASSISTED TOTAL HYSTERECTOMY WITH BILATERAL SALPINGO OOPHORECTOMY;  Surgeon: [name], MD;  Location: [facility] ORS;  Service: Gynecology;  Laterality: N/A;  . SENTINEL NODE BIOPSY N/A 07/29/2016   Procedure: SENTINEL NODE BIOPSY;  Surgeon: [name], MD;  Location: [facility] ORS;  Service: Gynecology;  Laterality: N/A;  . transobturator sling N/A 2009   [place]    There were no vitals filed for this  visit.  Subjective Assessment - 05/27/17 1519    Subjective   Pt presents for OT  visit 12/36 to address BLE lymphedema 2/2 lymphoma and endometrial cancer treatment. Pt reports she kept compression wraps in place except for shovelinfg snow this weekend.    Pertinent History  CVI, PAD, Ultrasound negative for DVT and + for baker's cyst; chemo-induced periferal neuropathy, endometrial ca, pelvic adhesive disease, PAD, recent weight loss reported as ~ 25-30 pounds, hx Hodgkin's lymphoma ( s/p xrt and chemo), OA, abdominal hysterectomy, oophorectomy, pelvic LN disection(pelvic/ aortic node biopsy 0/3 LN), post op infection/ vaginal cuff abcess, severe pelvic adhesive disease, , s/p chemotherapy and whole pelvic radiation    Limitations  leg swelling, L>R) , BLE pain/ discomfort, decreased sensation / neuropathic pain in feet and hands, difficulty walking, decreased standing tolerance, decreased balance w/ increased falls risk, difficulty fitting LB clothing and preferred street shoes, mildly impaired functional mobility and transferes    Patient Stated Goals  decrease leg swelling and pain.     Currently in Pain?  No/denies    Pain Onset  Other (comment) post chemo                   OT Treatments/Exercises (OP) - 05/27/17 0001      ADLs   ADL Education Given  Yes      Manual Therapy   Manual Therapy  Edema management;Manual Lymphatic Drainage (MLD);Compression Bandaging    Edema Management  skin care to LLE during MLD    Manual Lymphatic Drainage (MLD)  MLD to LLE/LLQ utilizing deep abdominal lymphatics and inguinal - axillary anastamoses in supine and sidelying.    Compression Bandaging  gradient compression wraps below knee only.             OT Education - 05/27/17 1522    Education provided  Yes    Education Details  LE self care    Person(s) Educated  Patient;Spouse    Methods  Explanation;Demonstration          OT Long Term Goals - 05/17/17 1509      OT LONG  TERM GOAL #1   Title  Pt independent w/ lymphedema precautions and prevention principals and strategies to limit LE progression and infection risk.    Baseline  dependent    Time  2    Period  Weeks    Status  Achieved    Target Date  05/17/17      OT LONG TERM GOAL #2   Title  Lymphedema (LE) management/ self-care: Pt able to apply thigh length, multi layered, gradient compression wraps with modified independence ( with extra time)  using proper techniques within 2 weeks to achieve optimal limb volume reductions in preparation for fitting compression garments/ devices bilaterally.    Baseline  dependent    Time  2    Period  Weeks    Status  Achieved  Target Date  05/17/17      OT LONG TERM GOAL #3   Title  Lymphedema (LE) management/ self-care:  Pt to achieve at least 10% LLE limb volume reductions  in the full LLE and 5% limb volume reduction in the RLE during Intensive Phase CDT to limit LE progression, to reduce pain, to improve ADLs performance.    Baseline  Max A    Time  12    Period  Weeks    Status  Partially Met      OT LONG TERM GOAL #4   Title  Lymphedema (LE) management/ self-care:  Pt to tolerate daily compression wraps, compression garments and/ or HOS devices in keeping w/ prescribed wear regime within 1 week of issue date of each to progress and retain clinical and functional gains and to limit LE progression.    Baseline  Max A    Time  12    Period  Weeks    Status  Partially Met      OT LONG TERM GOAL #5   Title  Lymphedema (LE) management/ self-care:  During Management Phase CDT Pt to sustain current limb volumes within 5%, and all other clinical gains achieved during OT treatment WITH MAXIMUM CAREGIVER ASSISTANCE  to control leg and foot swelling, to  limit LE progression, to dec4rease infection risk and to limit further functional decline.    Baseline  dependent    Time  6    Period  Months    Status  On-going            Plan - 05/27/17 1523     Clinical Impression Statement  Pt tolerated all aspects of manual therapy for CDT to LLE today. LLE is palpably less dense above and below knee, but volume reduction is not visible . Pt tolerated wraps to groin today despite plan to bowl to night. Pt contnues to strive to improve compliance.    Occupational Profile and client history currently impacting functional performance  Pt tolerated all aspects of manual CDT today. Pt continues to build   increased tolerance and compliance with thigh length bandaging regime. ZPt agrees w/ plan to complete repeat LLE comparative limb volumetrics    next visit.    Occupational performance deficits (Please refer to evaluation for details):  ADL's;IADL's;Rest and Sleep;Work;Leisure;Social Participation;Other;Play body image    Rehab Potential  Good    OT Frequency  3x / week       Patient will benefit from skilled therapeutic intervention in order to improve the following deficits and impairments:     Visit Diagnosis: Lymphedema, not elsewhere classified    Problem List Patient Active Problem List   Diagnosis Date Noted  . Cerumen impaction 01/12/2017  . Chemotherapy-induced peripheral neuropathy ([place]) 12/10/2016  . 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 SIRS (systemic inflammatory response syndrome) ([place]) 08/05/2016  . Endometrial cancer ([place])   . Pelvic adhesive disease   . Lymphedema 07/20/2016  . Chronic venous insufficiency 07/20/2016  . PAD (peripheral artery disease) ([place]) 07/20/2016  . Postmenopausal bleeding 07/14/2016  . Influenza 07/14/2016  . Class 1 obesity due to excess calories without serious comorbidity with body mass index (BMI) of 34.0 to 34.9 in adult 05/27/2016  . Chronic cough 12/19/2015  . Pre-syncope 12/19/2015  . Hx of colonic polyps   . Benign neoplasm of sigmoid colon   . Dysphagia, pharyngoesophageal phase 10/01/2014  . Prolapsed, uterovaginal, incomplete 06/24/2014  . Routine general medical examination at a health  care  facility 12/30/2012  . Obesity (BMI 30-39.9) 12/30/2012  . Hx of multiple pulmonary nodules 12/28/2012  . Plantar fasciitis of right foot 12/28/2012  . 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 Edema of both legs 09/12/2012  . Hip pain, bilateral 09/12/2012  . History of Hodgkin's lymphoma 09/12/2012  . Coronary artery disease   . Hyperlipidemia   . Chest pain on exertion 02/29/2012    Andrey Spearman, MS, OTR/L, [facility] 05/27/17 3:25 PM  [place] MAIN [facility] SERVICES [address] [place], [place], [address] Phone: [phone]   Fax:  [phone]  Name: MALISSIE MUSGRAVE MRN: [id number] Date of Birth: [birth year]

## 2017-05-31 ENCOUNTER — Encounter: Payer: PPO | Admitting: Occupational Therapy

## 2017-06-01 ENCOUNTER — Ambulatory Visit: Payer: PPO | Admitting: Occupational Therapy

## 2017-06-01 DIAGNOSIS — I890 Lymphedema, not elsewhere classified: Secondary | ICD-10-CM | POA: Diagnosis not present

## 2017-06-01 NOTE — Therapy (Signed)
[place] MAIN [facility] SERVICES [address] [place], [place], [address] Phone: [phone]   Fax:  [phone]  Occupational Therapy Treatment  Patient Details  Name: Jaime Hall MRN: [id number] Date of Birth: December 24, 1950 Referring Provider (Historical): Sindy Guadeloupe, MD   Encounter Date: 06/01/2017  OT End of Session - 06/01/17 1548    Visit Number  13    Number of Visits  36    Date for OT Re-Evaluation  07/07/17    OT Start Time  0205    OT Stop Time  0305    OT Time Calculation (min)  60 min    Activity Tolerance  Patient tolerated treatment well;No increased pain    Behavior During Therapy  WFL for tasks assessed/performed       Past Medical History:  Diagnosis Date  . Anxiety   . Cervicalgia   . Coronary artery disease    a. 02/2012 Stress echo: severe anterior wall ischemia;  b. 02/2012 Cath/PCI: LAD 95p (3.0 x 15 Xience EX DES), D1 90ost (PTCA - bifurcational dzs), EF 45% with anterior HK;  b. 02/2013 Ex MV: fixed anterior defect w/ minor reversibility, nl EF-->Med Rx.  . Endometrial cancer ([place])    a. 07/2016 s/p robotic hysterectomy, BSO w/ washings, sentinel node inj, mapping, bx, adhesiolysis.  . Essential hypertension, benign   . Fibrocystic breast disease   . GERD (gastroesophageal reflux disease)   . Gestational hypertension   . Heart murmur   . History of anemia   . History of blood transfusion   . Hodgkin's lymphoma ([place]) 2011   a. s/p radiation and chemo therapy  . Osteoarthritis   . Polycystic ovarian disease     Past Surgical History:  Procedure Laterality Date  . ABDOMINAL HYSTERECTOMY    . bladder sling    . CARDIAC CATHETERIZATION  02/2012   [facility] 1 stent place  . CERVICAL POLYPECTOMY    . CHOLECYSTECTOMY  1982  . COLONOSCOPY WITH PROPOFOL N/A 02/05/2015   Procedure: COLONOSCOPY WITH PROPOFOL;  Surgeon: Lucilla Lame, MD;  Location: [facility] ENDOSCOPY;  Service: Endoscopy;  Laterality: N/A;  . CORONARY  ANGIOPLASTY  02/2012   left/right s/p balloon  . CYSTOGRAM N/A 08/17/2016   Procedure: CYSTOGRAM;  Surgeon: Hollice Espy, MD;  Location: [facility] ORS;  Service: Urology;  Laterality: N/A;  . CYSTOSCOPY N/A 08/17/2016   Procedure: CYSTOSCOPY EXAM UNDER ANESTHESIA;  Surgeon: Hollice Espy, MD;  Location: [facility] ORS;  Service: Urology;  Laterality: N/A;  . CYSTOSCOPY W/ RETROGRADES Bilateral 08/17/2016   Procedure: CYSTOSCOPY WITH RETROGRADE PYELOGRAM;  Surgeon: Hollice Espy, MD;  Location: [facility] ORS;  Service: Urology;  Laterality: Bilateral;  . CYSTOSCOPY WITH STENT PLACEMENT Right 08/17/2016   Procedure: CYSTOSCOPY WITH STENT PLACEMENT;  Surgeon: Hollice Espy, MD;  Location: [facility] ORS;  Service: Urology;  Laterality: Right;  . heart stent'  2013  . kidney stent Right 2018  . LYMPH NODE BIOPSY  2011   diagnosis of hodgkins lymphoma  . PELVIC LYMPH NODE DISSECTION N/A 07/29/2016   Procedure: PELVIC/AORTIC LYMPH NODE SAMPLING;  Surgeon: [name], MD;  Location: [facility] ORS;  Service: Gynecology;  Laterality: N/A;  . PORTA CATH INSERTION N/A 09/22/2016   Procedure: Glori Luis Cath Insertion;  Surgeon: [name], MD;  Location: [place] CV LAB;  Service: Cardiovascular;  Laterality: N/A;  . PORTA CATH REMOVAL N/A 11/17/2016   Procedure: Glori Luis Cath Removal;  Surgeon: [name], MD;  Location: [place]  CV LAB;  Service: Cardiovascular;  Laterality: N/A;  . ROBOTIC ASSISTED TOTAL HYSTERECTOMY WITH BILATERAL SALPINGO OOPHERECTOMY N/A 07/29/2016   Procedure: ROBOTIC ASSISTED TOTAL HYSTERECTOMY WITH BILATERAL SALPINGO OOPHORECTOMY;  Surgeon: [name], MD;  Location: [facility] ORS;  Service: Gynecology;  Laterality: N/A;  . SENTINEL NODE BIOPSY N/A 07/29/2016   Procedure: SENTINEL NODE BIOPSY;  Surgeon: [name], MD;  Location: [facility] ORS;  Service: Gynecology;  Laterality: N/A;  . transobturator sling N/A 2009   [place]    There were no vitals filed for this  visit.  Subjective Assessment - 06/01/17 1546    Subjective   Pt presents for OT  visit 13/36 to address BLE lymphedema 2/2 lymphoma and endometrial cancer treatment. Pt reports she feels tired today.     Pertinent History  CVI, PAD, Ultrasound negative for DVT and + for baker's cyst; chemo-induced periferal neuropathy, endometrial ca, pelvic adhesive disease, PAD, recent weight loss reported as ~ 25-30 pounds, hx Hodgkin's lymphoma ( s/p xrt and chemo), OA, abdominal hysterectomy, oophorectomy, pelvic LN disection(pelvic/ aortic node biopsy 0/3 LN), post op infection/ vaginal cuff abcess, severe pelvic adhesive disease, , s/p chemotherapy and whole pelvic radiation    Limitations  leg swelling, L>R) , BLE pain/ discomfort, decreased sensation / neuropathic pain in feet and hands, difficulty walking, decreased standing tolerance, decreased balance w/ increased falls risk, difficulty fitting LB clothing and preferred street shoes, mildly impaired functional mobility and transferes    Patient Stated Goals  decrease leg swelling and pain.     Currently in Pain?  No/denies    Pain Onset  Other (comment) post chemo          LYMPHEDEMA/ONCOLOGY QUESTIONNAIRE - 06/01/17 1550      Left Lower Extremity Lymphedema   Other  LLE toes to knee (A-D)  Limb volume measures 3537.6 ml. LLE knee to groin (E-G) volume = 7265.1 ml. LLE full leg-ankle to groin (A-G)  measures 10802.66 ml.    Other  LLE A-D volume is decreased by 9.86% since initially measured on 04/12/2017. E-G volume is increased by 2.2 %, and full leg (A-G) volume is idecreased by 2.08% overall.               OT Treatments/Exercises (OP) - 06/01/17 0001      Manual Therapy   Manual Therapy  Edema management;Compression Bandaging    Manual therapy comments  LLE comparative luimb volumetrics             OT Education - 06/01/17 1547    Education provided  Yes    Education Details  Continued skilled Pt/caregiver education  And  LE ADL training throughout visit for lymphedema self care/ home program, including compression wrapping, compression garment and device wear/care, lymphatic pumping ther ex, simple self-MLD, and skin care. Discussed progress towards goals.    Person(s) Educated  Patient    Methods  Explanation;Demonstration    Comprehension  Verbalized understanding;Returned demonstration          OT Long Term Goals - 05/17/17 1509      OT LONG TERM GOAL #1   Title  Pt independent w/ lymphedema precautions and prevention principals and strategies to limit LE progression and infection risk.    Baseline  dependent    Time  2    Period  Weeks    Status  Achieved    Target Date  05/17/17      OT LONG TERM GOAL #2   Title  Lymphedema (LE) management/ self-care: Pt able to apply thigh length, multi layered, gradient compression wraps with modified independence ( with extra time)  using proper techniques within 2 weeks to achieve optimal limb volume reductions in preparation for fitting compression garments/ devices bilaterally.    Baseline  dependent    Time  2    Period  Weeks    Status  Achieved    Target Date  05/17/17      OT LONG TERM GOAL #3   Title  Lymphedema (LE) management/ self-care:  Pt to achieve at least 10% LLE limb volume reductions  in the full LLE and 5% limb volume reduction in the RLE during Intensive Phase CDT to limit LE progression, to reduce pain, to improve ADLs performance.    Baseline  Max A    Time  12    Period  Weeks    Status  Partially Met      OT LONG TERM GOAL #4   Title  Lymphedema (LE) management/ self-care:  Pt to tolerate daily compression wraps, compression garments and/ or HOS devices in keeping w/ prescribed wear regime within 1 week of issue date of each to progress and retain clinical and functional gains and to limit LE progression.    Baseline  Max A    Time  12    Period  Weeks    Status  Partially Met      OT LONG TERM GOAL #5   Title  Lymphedema  (LE) management/ self-care:  During Management Phase CDT Pt to sustain current limb volumes within 5%, and all other clinical gains achieved during OT treatment WITH MAXIMUM CAREGIVER ASSISTANCE  to control leg and foot swelling, to  limit LE progression, to dec4rease infection risk and to limit further functional decline.    Baseline  dependent    Time  6    Period  Months    Status  On-going            Plan - 06/01/17 1559    Clinical Impression Statement  LLE comparative limb volumetrics today reveal the following volumetric changes since commencing CDT to LLE in late october 2018. LLE A-D volume is decreased by 9.86% since initially measured on 04/12/2017. E-G volume is increased by 2.2 %, and full leg (A-G) volume is idecreased by 2.08% overall.  Pt has has improved compliance with thigh length compression over the past few weeks by report. Whel sleg segment demonstrates very near goal range reduction in volume,  thigh segment and limb volume overall is slightly increased. Pt agrees with plan to continue compression wrapping daily. We'll move forward with LLE custom compression garment in an effort to improve compliance with compression and improve mobilility, including work demands   for stadning long hours. We'll measure for LLE garment next visit and apply for charitable funds.     Occupational Profile and client history currently impacting functional performance  Pt tolerated all aspects of manual CDT today. Pt continues to build   increased tolerance and compliance with thigh length bandaging regime. ZPt agrees w/ plan to complete repeat LLE comparative limb volumetrics    next visit.    Occupational performance deficits (Please refer to evaluation for details):  ADL's;IADL's;Rest and Sleep;Work;Leisure;Social Participation;Other;Play body image    Rehab Potential  Good    OT Frequency  3x / week       Patient will benefit from skilled therapeutic intervention in order to improve the  following deficits and impairments:  Visit Diagnosis: Lymphedema, not elsewhere classified    Problem List Patient Active Problem List   Diagnosis Date Noted  . Cerumen impaction 01/12/2017  . Chemotherapy-induced peripheral neuropathy ([place]) 12/10/2016  . 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 SIRS (systemic inflammatory response syndrome) ([place]) 08/05/2016  . Endometrial cancer ([place])   . Pelvic adhesive disease   . Lymphedema 07/20/2016  . Chronic venous insufficiency 07/20/2016  . PAD (peripheral artery disease) ([place]) 07/20/2016  . Postmenopausal bleeding 07/14/2016  . Influenza 07/14/2016  . Class 1 obesity due to excess calories without serious comorbidity with body mass index (BMI) of 34.0 to 34.9 in adult 05/27/2016  . Chronic cough 12/19/2015  . Pre-syncope 12/19/2015  . Hx of colonic polyps   . Benign neoplasm of sigmoid colon   . Dysphagia, pharyngoesophageal phase 10/01/2014  . Prolapsed, uterovaginal, incomplete 06/24/2014  . Routine general medical examination at a health care facility 12/30/2012  . Obesity (BMI 30-39.9) 12/30/2012  . Hx of multiple pulmonary nodules 12/28/2012  . Plantar fasciitis of right foot 12/28/2012  . 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 Edema of both legs 09/12/2012  . Hip pain, bilateral 09/12/2012  . History of Hodgkin's lymphoma 09/12/2012  . Coronary artery disease   . Hyperlipidemia   . Chest pain on exertion 02/29/2012    Andrey Spearman, MS, OTR/L, [facility] 06/01/17 4:04 PM  [place] MAIN [facility] SERVICES [address] [place], [place], [address] Phone: [phone]   Fax:  [phone]  Name: Jaime Hall MRN: [id number] Date of Birth: 11-07-1950

## 2017-06-03 ENCOUNTER — Ambulatory Visit: Payer: PPO | Admitting: Occupational Therapy

## 2017-06-03 DIAGNOSIS — I890 Lymphedema, not elsewhere classified: Secondary | ICD-10-CM

## 2017-06-03 NOTE — Therapy (Signed)
[place] MAIN [facility] SERVICES [address] [place], [place], [address] Phone: [phone]   Fax:  [phone]  Occupational Therapy Treatment  Patient Details  Name: Jaime Hall MRN: [id number] Date of Birth: Sep 18, 1950 Referring Provider (Historical): Sindy Guadeloupe, MD   Encounter Date: 06/03/2017  OT End of Session - 06/03/17 1513    Visit Number  14    Number of Visits  36    Date for OT Re-Evaluation  07/07/17    OT Start Time  0208    OT Stop Time  0305    OT Time Calculation (min)  57 min    Activity Tolerance  Patient tolerated treatment well;No increased pain    Behavior During Therapy  WFL for tasks assessed/performed       Past Medical History:  Diagnosis Date  . Anxiety   . Cervicalgia   . Coronary artery disease    a. 02/2012 Stress echo: severe anterior wall ischemia;  b. 02/2012 Cath/PCI: LAD 95p (3.0 x 15 Xience EX DES), D1 90ost (PTCA - bifurcational dzs), EF 45% with anterior HK;  b. 02/2013 Ex MV: fixed anterior defect w/ minor reversibility, nl EF-->Med Rx.  . Endometrial cancer ([place])    a. 07/2016 s/p robotic hysterectomy, BSO w/ washings, sentinel node inj, mapping, bx, adhesiolysis.  . Essential hypertension, benign   . Fibrocystic breast disease   . GERD (gastroesophageal reflux disease)   . Gestational hypertension   . Heart murmur   . History of anemia   . History of blood transfusion   . Hodgkin's lymphoma ([place]) 2011   a. s/p radiation and chemo therapy  . Osteoarthritis   . Polycystic ovarian disease     Past Surgical History:  Procedure Laterality Date  . ABDOMINAL HYSTERECTOMY    . bladder sling    . CARDIAC CATHETERIZATION  02/2012   [facility] 1 stent place  . CERVICAL POLYPECTOMY    . CHOLECYSTECTOMY  1982  . COLONOSCOPY WITH PROPOFOL N/A 02/05/2015   Procedure: COLONOSCOPY WITH PROPOFOL;  Surgeon: Lucilla Lame, MD;  Location: [facility] ENDOSCOPY;  Service: Endoscopy;  Laterality: N/A;  . CORONARY  ANGIOPLASTY  02/2012   left/right s/p balloon  . CYSTOGRAM N/A 08/17/2016   Procedure: CYSTOGRAM;  Surgeon: Hollice Espy, MD;  Location: [facility] ORS;  Service: Urology;  Laterality: N/A;  . CYSTOSCOPY N/A 08/17/2016   Procedure: CYSTOSCOPY EXAM UNDER ANESTHESIA;  Surgeon: Hollice Espy, MD;  Location: [facility] ORS;  Service: Urology;  Laterality: N/A;  . CYSTOSCOPY W/ RETROGRADES Bilateral 08/17/2016   Procedure: CYSTOSCOPY WITH RETROGRADE PYELOGRAM;  Surgeon: Hollice Espy, MD;  Location: [facility] ORS;  Service: Urology;  Laterality: Bilateral;  . CYSTOSCOPY WITH STENT PLACEMENT Right 08/17/2016   Procedure: CYSTOSCOPY WITH STENT PLACEMENT;  Surgeon: Hollice Espy, MD;  Location: [facility] ORS;  Service: Urology;  Laterality: Right;  . heart stent'  2013  . kidney stent Right 2018  . LYMPH NODE BIOPSY  2011   diagnosis of hodgkins lymphoma  . PELVIC LYMPH NODE DISSECTION N/A 07/29/2016   Procedure: PELVIC/AORTIC LYMPH NODE SAMPLING;  Surgeon: [name], MD;  Location: [facility] ORS;  Service: Gynecology;  Laterality: N/A;  . PORTA CATH INSERTION N/A 09/22/2016   Procedure: Glori Luis Cath Insertion;  Surgeon: [name], MD;  Location: [place] CV LAB;  Service: Cardiovascular;  Laterality: N/A;  . PORTA CATH REMOVAL N/A 11/17/2016   Procedure: Glori Luis Cath Removal;  Surgeon: [name], MD;  Location: [place]  CV LAB;  Service: Cardiovascular;  Laterality: N/A;  . ROBOTIC ASSISTED TOTAL HYSTERECTOMY WITH BILATERAL SALPINGO OOPHERECTOMY N/A 07/29/2016   Procedure: ROBOTIC ASSISTED TOTAL HYSTERECTOMY WITH BILATERAL SALPINGO OOPHORECTOMY;  Surgeon: [name], MD;  Location: [facility] ORS;  Service: Gynecology;  Laterality: N/A;  . SENTINEL NODE BIOPSY N/A 07/29/2016   Procedure: SENTINEL NODE BIOPSY;  Surgeon: [name], MD;  Location: [facility] ORS;  Service: Gynecology;  Laterality: N/A;  . transobturator sling N/A 2009   [place]    There were no vitals filed for this  visit.  Subjective Assessment - 06/03/17 1511    Subjective   Pt presents for OT  visit 14/36 to address BLE lymphedema 2/2 lymphoma and endometrial cancer treatment. Pt reports she feels tired  again today. Pt in agreement with plan to complete compression garment measurements today.    Pertinent History  CVI, PAD, Ultrasound negative for DVT and + for baker's cyst; chemo-induced periferal neuropathy, endometrial ca, pelvic adhesive disease, PAD, recent weight loss reported as ~ 25-30 pounds, hx Hodgkin's lymphoma ( s/p xrt and chemo), OA, abdominal hysterectomy, oophorectomy, pelvic LN disection(pelvic/ aortic node biopsy 0/3 LN), post op infection/ vaginal cuff abcess, severe pelvic adhesive disease, , s/p chemotherapy and whole pelvic radiation    Limitations  leg swelling, L>R) , BLE pain/ discomfort, decreased sensation / neuropathic pain in feet and hands, difficulty walking, decreased standing tolerance, decreased balance w/ increased falls risk, difficulty fitting LB clothing and preferred street shoes, mildly impaired functional mobility and transferes    Patient Stated Goals  decrease leg swelling and pain.     Currently in Pain?  No/denies    Pain Onset  Other (comment) post chemo                   OT Treatments/Exercises (OP) - 06/03/17 0001      ADLs   ADL Education Given  Yes      Manual Therapy   Manual Therapy  Edema management;Compression Bandaging    Manual therapy comments  completed LLE anatomical measurements for custom, [employer], thigh high, open toe , ccl 3 compression garment. Faxed to [facility] vendor    Compression Bandaging  gradient compression wraps below knee only.             OT Education - 06/03/17 1513    Education provided  Yes    Education Details  Continued skilled Pt/caregiver education  And LE ADL training throughout visit for lymphedema self care/ home program, including compression wrapping, compression garment and device wear/care,  lymphatic pumping ther ex, simple self-MLD, and skin care. Discussed progress towards goals.    Person(s) Educated  Patient    Methods  Explanation;Demonstration    Comprehension  Verbalized understanding;Returned demonstration          OT Long Term Goals - 05/17/17 1509      OT LONG TERM GOAL #1   Title  Pt independent w/ lymphedema precautions and prevention principals and strategies to limit LE progression and infection risk.    Baseline  dependent    Time  2    Period  Weeks    Status  Achieved    Target Date  05/17/17      OT LONG TERM GOAL #2   Title  Lymphedema (LE) management/ self-care: Pt able to apply thigh length, multi layered, gradient compression wraps with modified independence ( with extra time)  using proper techniques within 2 weeks to achieve optimal limb  volume reductions in preparation for fitting compression garments/ devices bilaterally.    Baseline  dependent    Time  2    Period  Weeks    Status  Achieved    Target Date  05/17/17      OT LONG TERM GOAL #3   Title  Lymphedema (LE) management/ self-care:  Pt to achieve at least 10% LLE limb volume reductions  in the full LLE and 5% limb volume reduction in the RLE during Intensive Phase CDT to limit LE progression, to reduce pain, to improve ADLs performance.    Baseline  Max A    Time  12    Period  Weeks    Status  Partially Met      OT LONG TERM GOAL #4   Title  Lymphedema (LE) management/ self-care:  Pt to tolerate daily compression wraps, compression garments and/ or HOS devices in keeping w/ prescribed wear regime within 1 week of issue date of each to progress and retain clinical and functional gains and to limit LE progression.    Baseline  Max A    Time  12    Period  Weeks    Status  Partially Met      OT LONG TERM GOAL #5   Title  Lymphedema (LE) management/ self-care:  During Management Phase CDT Pt to sustain current limb volumes within 5%, and all other clinical gains achieved during OT  treatment WITH MAXIMUM CAREGIVER ASSISTANCE  to control leg and foot swelling, to  limit LE progression, to dec4rease infection risk and to limit further functional decline.    Baseline  dependent    Time  6    Period  Months    Status  On-going            Plan - 06/03/17 1514    Clinical Impression Statement  Completed LLE anatomical measurements for sustom, ccl 3, thigh high compression stocking. Applied gradient compression wraps from foot to groin as established. Pt tolerated all aspects of treatment well today. Encouraged her to use wraps during week long holiday break from OT.     Occupational Profile and client history currently impacting functional performance  Pt tolerated all aspects of manual CDT today. Pt continues to build   increased tolerance and compliance with thigh length bandaging regime. ZPt agrees w/ plan to complete repeat LLE comparative limb volumetrics    next visit.    Occupational performance deficits (Please refer to evaluation for details):  ADL's;IADL's;Rest and Sleep;Work;Leisure;Social Participation;Other;Play body image    Rehab Potential  Good    OT Frequency  3x / week       Patient will benefit from skilled therapeutic intervention in order to improve the following deficits and impairments:     Visit Diagnosis: Lymphedema, not elsewhere classified    Problem List Patient Active Problem List   Diagnosis Date Noted  . Cerumen impaction 01/12/2017  . Chemotherapy-induced peripheral neuropathy ([place]) 12/10/2016  . 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 SIRS (systemic inflammatory response syndrome) ([place]) 08/05/2016  . Endometrial cancer ([place])   . Pelvic adhesive disease   . Lymphedema 07/20/2016  . Chronic venous insufficiency 07/20/2016  . PAD (peripheral artery disease) ([place]) 07/20/2016  . Postmenopausal bleeding 07/14/2016  . Influenza 07/14/2016  . Class 1 obesity due to excess calories without serious comorbidity with body mass index (BMI)  of 34.0 to 34.9 in adult 05/27/2016  . Chronic cough 12/19/2015  . Pre-syncope 12/19/2015  . Hx of  colonic polyps   . Benign neoplasm of sigmoid colon   . Dysphagia, pharyngoesophageal phase 10/01/2014  . Prolapsed, uterovaginal, incomplete 06/24/2014  . Routine general medical examination at a health care facility 12/30/2012  . Obesity (BMI 30-39.9) 12/30/2012  . Hx of multiple pulmonary nodules 12/28/2012  . Plantar fasciitis of right foot 12/28/2012  . 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 Edema of both legs 09/12/2012  . Hip pain, bilateral 09/12/2012  . History of Hodgkin's lymphoma 09/12/2012  . Coronary artery disease   . Hyperlipidemia   . Chest pain on exertion 02/29/2012    Andrey Spearman, MS, OTR/L, [facility] 06/03/17 3:17 PM  [place] MAIN [facility] SERVICES [address] [place], [place], [address] Phone: [phone]   Fax:  [phone]  Name: Jaime Hall MRN: [id number] Date of Birth: Jan 01, 1951

## 2017-06-09 ENCOUNTER — Other Ambulatory Visit: Payer: Self-pay | Admitting: *Deleted

## 2017-06-09 DIAGNOSIS — C541 Malignant neoplasm of endometrium: Secondary | ICD-10-CM

## 2017-06-10 ENCOUNTER — Encounter: Payer: Self-pay | Admitting: Oncology

## 2017-06-10 ENCOUNTER — Inpatient Hospital Stay: Payer: PPO | Attending: Oncology

## 2017-06-10 ENCOUNTER — Inpatient Hospital Stay (HOSPITAL_BASED_OUTPATIENT_CLINIC_OR_DEPARTMENT_OTHER): Payer: PPO | Admitting: Oncology

## 2017-06-10 VITALS — BP 134/83 | HR 86 | Temp 97.8°F | Resp 18 | Wt 175.0 lb

## 2017-06-10 DIAGNOSIS — E282 Polycystic ovarian syndrome: Secondary | ICD-10-CM | POA: Diagnosis not present

## 2017-06-10 DIAGNOSIS — Z90710 Acquired absence of both cervix and uterus: Secondary | ICD-10-CM

## 2017-06-10 DIAGNOSIS — Z923 Personal history of irradiation: Secondary | ICD-10-CM | POA: Diagnosis not present

## 2017-06-10 DIAGNOSIS — M542 Cervicalgia: Secondary | ICD-10-CM | POA: Diagnosis not present

## 2017-06-10 DIAGNOSIS — K219 Gastro-esophageal reflux disease without esophagitis: Secondary | ICD-10-CM | POA: Diagnosis not present

## 2017-06-10 DIAGNOSIS — R011 Cardiac murmur, unspecified: Secondary | ICD-10-CM

## 2017-06-10 DIAGNOSIS — Z87891 Personal history of nicotine dependence: Secondary | ICD-10-CM | POA: Diagnosis not present

## 2017-06-10 DIAGNOSIS — M1990 Unspecified osteoarthritis, unspecified site: Secondary | ICD-10-CM

## 2017-06-10 DIAGNOSIS — Z8572 Personal history of non-Hodgkin lymphomas: Secondary | ICD-10-CM

## 2017-06-10 DIAGNOSIS — I890 Lymphedema, not elsewhere classified: Secondary | ICD-10-CM | POA: Insufficient documentation

## 2017-06-10 DIAGNOSIS — I700 Atherosclerosis of aorta: Secondary | ICD-10-CM | POA: Insufficient documentation

## 2017-06-10 DIAGNOSIS — G629 Polyneuropathy, unspecified: Secondary | ICD-10-CM | POA: Insufficient documentation

## 2017-06-10 DIAGNOSIS — Z8542 Personal history of malignant neoplasm of other parts of uterus: Principal | ICD-10-CM

## 2017-06-10 DIAGNOSIS — I10 Essential (primary) hypertension: Secondary | ICD-10-CM | POA: Insufficient documentation

## 2017-06-10 DIAGNOSIS — F419 Anxiety disorder, unspecified: Secondary | ICD-10-CM | POA: Insufficient documentation

## 2017-06-10 DIAGNOSIS — I2510 Atherosclerotic heart disease of native coronary artery without angina pectoris: Secondary | ICD-10-CM | POA: Diagnosis not present

## 2017-06-10 DIAGNOSIS — Z90722 Acquired absence of ovaries, bilateral: Secondary | ICD-10-CM | POA: Insufficient documentation

## 2017-06-10 DIAGNOSIS — C541 Malignant neoplasm of endometrium: Secondary | ICD-10-CM

## 2017-06-10 DIAGNOSIS — C786 Secondary malignant neoplasm of retroperitoneum and peritoneum: Secondary | ICD-10-CM

## 2017-06-10 DIAGNOSIS — T451X5A Adverse effect of antineoplastic and immunosuppressive drugs, initial encounter: Secondary | ICD-10-CM

## 2017-06-10 DIAGNOSIS — G620 Drug-induced polyneuropathy: Secondary | ICD-10-CM

## 2017-06-10 DIAGNOSIS — Z9221 Personal history of antineoplastic chemotherapy: Secondary | ICD-10-CM | POA: Insufficient documentation

## 2017-06-10 DIAGNOSIS — Z08 Encounter for follow-up examination after completed treatment for malignant neoplasm: Secondary | ICD-10-CM

## 2017-06-10 DIAGNOSIS — D649 Anemia, unspecified: Secondary | ICD-10-CM | POA: Insufficient documentation

## 2017-06-10 LAB — COMPREHENSIVE METABOLIC PANEL
ALT: 15 U/L (ref 14–54)
AST: 20 U/L (ref 15–41)
Albumin: 3.7 g/dL (ref 3.5–5.0)
Alkaline Phosphatase: 70 U/L (ref 38–126)
Anion gap: 8 (ref 5–15)
BUN: 15 mg/dL (ref 6–20)
CO2: 27 mmol/L (ref 22–32)
Calcium: 9 mg/dL (ref 8.9–10.3)
Chloride: 105 mmol/L (ref 101–111)
Creatinine, Ser: 0.86 mg/dL (ref 0.44–1.00)
GFR calc Af Amer: >60 mL/min (ref >60)
GFR calc non Af Amer: >60 mL/min (ref >60)
Glucose, Bld: 110 mg/dL — ABNORMAL HIGH (ref 65–99)
Potassium: 3.5 mmol/L (ref 3.5–5.1)
Sodium: 140 mmol/L (ref 135–145)
Total Bilirubin: 0.8 mg/dL (ref 0.3–1.2)
Total Protein: 6.5 g/dL (ref 6.5–8.1)

## 2017-06-10 LAB — CBC WITH DIFFERENTIAL/PLATELET
Basophils Absolute: 0 10*3/uL (ref 0–0.1)
Basophils Relative: 1 %
Eosinophils Absolute: 0.2 10*3/uL (ref 0–0.7)
Eosinophils Relative: 3 %
HCT: 34.7 % — ABNORMAL LOW (ref 35.0–47.0)
Hemoglobin: 11.7 g/dL — ABNORMAL LOW (ref 12.0–16.0)
Lymphocytes Relative: 8 %
Lymphs Abs: 0.5 10*3/uL — ABNORMAL LOW (ref 1.0–3.6)
MCH: 30.3 pg (ref 26.0–34.0)
MCHC: 33.7 g/dL (ref 32.0–36.0)
MCV: 89.9 fL (ref 80.0–100.0)
Monocytes Absolute: 0.4 10*3/uL (ref 0.2–0.9)
Monocytes Relative: 6 %
Neutro Abs: 5.4 10*3/uL (ref 1.4–6.5)
Neutrophils Relative %: 84 %
Platelets: 243 10*3/uL (ref 150–440)
RBC: 3.86 MIL/uL (ref 3.80–5.20)
RDW: 13.7 % (ref 11.5–14.5)
WBC: 6.4 10*3/uL (ref 3.6–11.0)

## 2017-06-11 LAB — CA 125: Cancer Antigen (CA) 125: 17.2 U/mL (ref 0.0–38.1)

## 2017-06-11 NOTE — Progress Notes (Signed)
Hematology/Oncology Consult note [facility]  [phone] [phone]  Patient Care Team: [name], MD as PCP - General (Internal Medicine) [name], MD as Consulting Physician (General Surgery) Mellody Drown, MD as Referring Physician (Obstetrics and Gynecology) [name], MD as Referring Physician (Obstetrics and Gynecology) Hollice Espy, MD as Consulting Physician (Urology) [name], RN as Registered Nurse Sindy Guadeloupe, MD as Consulting Physician (Oncology)   Name of the patient: Jaime Hall  [id number]  [birth year]   Date of visit: 06/11/17  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 [name] grade 1 endometrial cancer when she presented with postmenopausal bleeding in February 2018. She underwent robotic hysterectomy and bilateral salpingo-oophorectomy with washings and sentinel lymph node injection and mapping and biopsy on 07/29/2016. She was found to have extensive pelvic adhesive disease and underwent elevated pelvic adhesiolysis including enteric and ovariolysis. The cul de sac was obliterated and adherent to posterior cervix.-was negative.  2. Pathology showed: Pathology DIAGNOSIS:  A. SENTINEL LYMPH NODE, RIGHT MID OBTURATOR; EXCISION:  - NO TUMOR SEEN IN ONE LYMPH NODE (0/1).   B. SENTINEL LYMPH NODE, RIGHT EXTERNAL ILIAC; EXCISION:  - NO TUMOR SEEN IN ONE LYMPH NODE (0/1).   C. SENTINEL LYMPH NODE, LEFT EXTERNAL ILIAC; EXCISION:  - NO TUMOR SEEN IN ONE LYMPH NODE (0/1).   D. UTERUS WITH CERVIX, BILATERAL FALLOPIAN TUBES AND OVARIES;  HYSTERECTOMY AND BILATERAL SALPINGO-OOPHORECTOMY:  - SEROUS CARCINOMA, 4.5 CM(SEE NOTE).  - LYMPHOVASCULAR INVASION PRESENT.  - NO TUMOR SEEN IN BILATERAL OVARIES AND FALLOPIAN  TUBES.    ENDOMETRIUM:  Procedure: Hysterectomy with bilateral salpingo-oophorectomy  Histologic Type: Serous carcinoma  Myometrial Invasion: Present  Depth of invasion (millimeters): 13 mm  Myometrial thickness (millimeters): 14 mm  Percentage of myometrial invasion: 93%  Uterine Serosa Involvement: Not identified  Cervical Stromal Involvement: Not identified   BIOMARKER REPORTING TEMPLATE:  p53 Expression: Abnormal strong diffuse overexpression (>90%)  ER/PR negative. Her2 pending MS stable  3. She presented with fever on 08/05/2016 and CT scan showed 1.8 x 3.8 cm complex fluid collection containing pockets of air within somewhat organized walls within the pelvis and the hysterectomy bed most consistent with a developing abscess. There is a superior extension of the loculated fluid along the left lateral pelvic wall anterior to the left psoas muscle measuring 2 x 4 cm and another fluid collection along the anterior surface of the left psoas muscle measuring 1.8 x 3.6 cm. Blood cultures were positive for bacteria growing his fragilis. Urine cultures were negative. Patient was treated with IV antibiotics and discharged on Augmentin.  4. 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 [name] from urology on 08/14/2016. No leak was seen on CT urogram.  5. On 08/17/2016 patient underwent Exam under anesthesia, vaginoscopy, cystoscopy, cystogram with interpretation of fluoroscopy less than 30 minutes, bilateral retrograde pyelogram, and right ureteral stent placement. Intraoperative findings: No obvious ureterovaginal vesicovaginal fistula appreciated. Mild right hydroureteronephrosis down to level of the distal ureter with slight medial deviation without filling defects. Slight mucopurulent drainage from left apical vaginal cuff,  specimen cultured. Culture revealed bateroides fragilis and she was started on a 2 week course of Flagyl iniated on 08/24/2016 which  she has now completed  6. Repeat CT abdomen on 09/10/2016 IMPRESSION:    1. Interval resolution of abscess involving the vaginal cuff.    2. The proximal end of the right ureteral stent is coiled in the proximal right ureter. No hydronephrosis.    3. No evidence of metastatic disease or other significant changes.    4. Aortic atherosclerosis        7. Patient has been recovering slowly following her complicated postoperative course without fever or vaginal leakage. She did suffer from fatigue. She remained independent in her ADLs. She has a prior history of Hodgkin's lymphoma in 2011 treated with anthracycline-based chemotherapy  8. There was concern about port infection and was looked at by vascular surgery. They did not think it was infected and recommend conservative management.   9. Patient completed 3 cycles of carbo/taxol chemotherapy 11/12/16. Taxol was dose reduced by 25% during dose 3 due to neuropathy.   10. Patient completed 5 weeks of external beam whole pelvic radiation 01/19/17.     Interval history- patient reports pain and numbness from neuropathy persists but has stabilized after acupuncture. She is not taking any meds at this time and would not like any. Denies any abdominal bloating or pain. She is using compression wraps for lymphedema in her right leg  ECOG PS- 1 Pain scale- 4   Review of systems- Review of Systems  Constitutional: Negative for chills, fever, malaise/fatigue and weight loss.  HENT: Negative for congestion, ear discharge and nosebleeds.   Eyes: Negative for blurred vision.  Respiratory: Negative for cough, hemoptysis, sputum production, shortness of breath and wheezing.   Cardiovascular: Negative for chest pain, palpitations, orthopnea and claudication.  Gastrointestinal: Negative for abdominal pain, blood in stool,  constipation, diarrhea, heartburn, melena, nausea and vomiting.  Genitourinary: Negative for dysuria, flank pain, frequency, hematuria and urgency.  Musculoskeletal: Negative for back pain, joint pain and myalgias.  Skin: Negative for rash.  Neurological: Positive for tingling. Negative for dizziness, focal weakness, seizures, weakness and headaches.  Endo/Heme/Allergies: Does not bruise/bleed easily.  Psychiatric/Behavioral: Negative for depression and suicidal ideas. The patient does not have insomnia.      Allergies  Allergen Reactions  . Adhesive [Tape] Other (See Comments)    Burns the skin  . Z-Pak [Azithromycin] Itching  . Antifungal [Miconazole Nitrate] Rash  . Sulfa Antibiotics Nausea And Vomiting and Rash    N/T     Past Medical History:  Diagnosis Date  . Anxiety   . Cervicalgia   . Coronary artery disease    a. 02/2012 Stress echo: severe anterior wall ischemia;  b. 02/2012 Cath/PCI: LAD 95p (3.0 x 15 Xience EX DES), D1 90ost (PTCA - bifurcational dzs), EF 45% with anterior HK;  b. 02/2013 Ex MV: fixed anterior defect w/ minor reversibility, nl EF-->Med Rx.  . Endometrial cancer ([place])    a. 07/2016 s/p robotic hysterectomy, BSO w/ washings, sentinel node inj, mapping, bx, adhesiolysis.  . Essential hypertension, benign   . Fibrocystic breast disease   . GERD (gastroesophageal reflux disease)   . Gestational hypertension   . Heart murmur   . History of anemia   . History of blood transfusion   . Hodgkin's lymphoma ([place]) 2011   a. s/p radiation and chemo therapy  . Osteoarthritis   . Polycystic ovarian disease      Past Surgical History:  Procedure Laterality Date  . ABDOMINAL HYSTERECTOMY    . bladder sling    . CARDIAC CATHETERIZATION  02/2012   [place] 1 stent place  . CERVICAL POLYPECTOMY    . CHOLECYSTECTOMY  1982  . COLONOSCOPY WITH PROPOFOL N/A 02/05/2015   Procedure: COLONOSCOPY WITH PROPOFOL;  Surgeon: Lucilla Lame, MD;  Location: [facility];  Service:  Endoscopy;  Laterality: N/A;  . CORONARY ANGIOPLASTY  02/2012   left/right s/p balloon  . CYSTOGRAM N/A 08/17/2016   Procedure: CYSTOGRAM;  Surgeon: Hollice Espy, MD;  Location: [facility];  Service: Urology;  Laterality: N/A;  . CYSTOSCOPY N/A 08/17/2016   Procedure: CYSTOSCOPY EXAM UNDER ANESTHESIA;  Surgeon: Hollice Espy, MD;  Location: [facility] OR[facility];  Service: Urology;  Laterality: N/A;  . CYSTOSCOPY W/ RETROGRADES Bilateral 08/17/2016   Procedure: CYSTOSCOPY WITH RETROGRADE PYELOGRAM;  Surgeon: Hollice Espy, MD;  Location: [facility];  Service: Urology;  Laterality: Bilateral;  . CYSTOSCOPY WITH STENT PLACEMENT Right 08/17/2016   Procedure: CYSTOSCOPY WITH STENT PLACEMENT;  Surgeon: Hollice Espy, MD;  Location: [facility];  Service: Urology;  Laterality: Right;  . heart stent'  2013  . kidney stent Right 2018  . LYMPH NODE BIOPSY  2011   diagnosis of hodgkins lymphoma  . PELVIC LYMPH NODE DISSECTION N/A 07/29/2016   Procedure: PELVIC/AORTIC LYMPH NODE SAMPLING;  Surgeon: [name], MD;  Location: [facility];  Service: Gynecology;  Laterality: N/A;  . PORTA CATH INSERTION N/A 09/22/2016   Procedure: Glori Luis Cath Insertion;  Surgeon: [name], MD;  Location: [facility];  Service: Cardiovascular;  Laterality: N/A;  . PORTA CATH REMOVAL N/A 11/17/2016   Procedure: Glori Luis Cath Removal;  Surgeon: [name], MD;  Location: [place] [facility];  Service: Cardiovascular;  Laterality: N/A;  . ROBOTIC ASSISTED TOTAL HYSTERECTOMY WITH BILATERAL SALPINGO OOPHERECTOMY N/A 07/29/2016   Procedure: ROBOTIC ASSISTED TOTAL HYSTERECTOMY WITH BILATERAL SALPINGO OOPHORECTOMY;  Surgeon: [name], MD;  Location: [facility];  Service: Gynecology;  Laterality: N/A;  . SENTINEL NODE BIOPSY N/A 07/29/2016   Procedure: SENTINEL NODE BIOPSY;  Surgeon: [name], MD;  Location: [facility];  Service: Gynecology;  Laterality: N/A;  . transobturator sling N/A 2009   [place]      Social History   Socioeconomic History  . Marital status: Widowed    Spouse name: Not on file  . Number of children: Not on file  . Years of education: Not on file  . Highest education level: Not on file  Social Needs  . Financial resource strain: Not on file  . Food insecurity - worry: Not on file  . Food insecurity - inability: Not on file  . Transportation needs - medical: Not on file  . Transportation needs - non-medical: Not on file  Occupational History  . Not on file  Tobacco Use  . Smoking status: Former Smoker    Packs/day: 1.00    Years: 30.00    Pack years: 30.00    Types: Cigarettes    Last attempt to quit: 07/24/2002    Years since quitting: 14.8  . Smokeless tobacco: Never Used  . Tobacco comment: quit smoking in 2000  Substance and Sexual Activity  . Alcohol use: Yes    Comment: occasional, 1-2 drinks per month  . Drug use: No  . Sexual activity: No  Other Topics Concern  . Not on file  Social History Narrative  . Not on file    Family History  Problem Relation Age of Onset  . ALS Father   . Polymyositis Father   . Diabetes Brother   .  Cancer Maternal Aunt        breast  . Breast cancer Maternal Aunt        30's  . Stroke Maternal Grandmother   . Cancer Maternal Grandfather        prostate  . Stroke Maternal Grandfather      Current Outpatient Medications:  .  acetaminophen (TYLENOL) 500 MG tablet, Take 1,000 mg by mouth every 6 (six) hours as needed for mild pain. , Disp: , Rfl:  .  aspirin EC 81 MG tablet, Take 81 mg by mouth at bedtime., Disp: , Rfl:  .  atorvastatin (LIPITOR) 10 MG tablet, TAKE ONE TABLET BY MOUTH ONCE DAILY (Patient taking differently: Take 10 mg by mouth at bedtime. ), Disp: 90 tablet, Rfl: 3 .  Cholecalciferol (VITAMIN D3) 2000 units TABS, Take 2,000 Units by mouth 3 (three) times a week., Disp: , Rfl:  .  diazepam (VALIUM) 5 MG tablet, Take 1 tablet (5 mg total) by mouth every 12 (twelve) hours as needed for  anxiety., Disp: 30 tablet, Rfl: 0 .  esomeprazole (NEXIUM) 20 MG capsule, Take 20 mg by mouth daily before breakfast. , Disp: , Rfl:  .  ibuprofen (ADVIL,MOTRIN) 200 MG tablet, Take 200 mg by mouth every 6 (six) hours as needed for mild pain. , Disp: , Rfl:  .  metoprolol tartrate (LOPRESSOR) 25 MG tablet, TAKE ONE TABLET BY MOUTH TWICE DAILY, Disp: 180 tablet, Rfl: 3 .  naproxen sodium (ANAPROX) 220 MG tablet, Take 440 mg by mouth 2 (two) times daily as needed (for pain/headache.)., Disp: , Rfl:  .  nitroGLYCERIN (NITROSTAT) 0.4 MG SL tablet, Place 1 tablet (0.4 mg total) under the tongue every 5 (five) minutes as needed for chest pain., Disp: 25 tablet, Rfl: 3 .  Turmeric 500 MG CAPS, Take 500 mg by mouth 2 (two) times daily., Disp: , Rfl:   Physical exam:  Vitals:   06/10/17 1446  BP: 134/83  Pulse: 86  Resp: 18  Temp: 97.8 F (36.6 C)  TempSrc: Tympanic  Weight: 175 lb (79.4 kg)   Physical Exam  Constitutional: She is oriented to person, place, and time and well-developed, well-nourished, and in no distress.  HENT:  Head: Normocephalic and atraumatic.  Eyes: EOM are normal. Pupils are equal, round, and reactive to light.  Neck: Normal range of motion.  Cardiovascular: Normal rate, regular rhythm and normal heart sounds.  Pulmonary/Chest: Effort normal and breath sounds normal.  Abdominal: Soft. Bowel sounds are normal. She exhibits no distension and no mass. There is no tenderness.  Musculoskeletal:  Compression wrap in place over LLE  Neurological: She is alert and oriented to person, place, and time.  Skin: Skin is warm and dry.     CMP Latest Ref Rng & Units 06/10/2017  Glucose 65 - 99 mg/dL 110(H)  BUN 6 - 20 mg/dL 15  Creatinine 0.44 - 1.00 mg/dL 0.86  Sodium 135 - 145 mmol/L 140  Potassium 3.5 - 5.1 mmol/L 3.5  Chloride 101 - 111 mmol/L 105  CO2 22 - 32 mmol/L 27  Calcium 8.9 - 10.3 mg/dL 9.0  Total Protein 6.5 - 8.1 g/dL 6.5  Total Bilirubin 0.3 - 1.2 mg/dL 0.8    Alkaline Phos 38 - 126 U/L 70  AST 15 - 41 U/L 20  ALT 14 - 54 U/L 15   CBC Latest Ref Rng & Units 06/10/2017  WBC 3.6 - 11.0 K/uL 6.4  Hemoglobin 12.0 - 16.0 g/dL 11.7(L)  Hematocrit  35.0 - 47.0 % 34.7(L)  Platelets 150 - 440 K/uL 243    Assessment and plan- Patient is a 66 y.o. female h/o Stage I high risk serous endometrial cancer s/p 3 cyclesof carbo/taxol and WPRT here for surveillance of endometrial cancer  Clinically she is doing well. There is no evidence of recurrence on todays exam. plevic exam not done today and will be done by Dr. Leonides Schanz in 3 months. She sees Dr. [name] in June 2019. I will see her 7-8 months from now  Mild normocytic anemia: continue to monitpr  Chemo induced peripheral neuropathy- stable. She will continue maintenance acupuncture treatments     Visit Diagnosis 1. Encounter for follow-up surveillance of endometrial cancer   2. Chemotherapy-induced neuropathy ([place])      Dr. Randa Evens, MD, MPH [facility] Pager- [phone] 06/11/2017 2:22 PM

## 2017-06-14 ENCOUNTER — Ambulatory Visit: Payer: PPO | Admitting: Occupational Therapy

## 2017-06-14 DIAGNOSIS — I890 Lymphedema, not elsewhere classified: Secondary | ICD-10-CM

## 2017-06-14 NOTE — Therapy (Signed)
[place] MAIN [facility] SERVICES [address] [place], [place], [address] Phone: [phone]   Fax:  [phone]  Occupational Therapy Treatment  Patient Details  Name: Jaime Hall MRN: [id number] Date of Birth: Sep 21, 1950 Referring Provider (Historical): Sindy Guadeloupe, MD   Encounter Date: 06/14/2017  OT End of Session - 06/14/17 1703    Visit Number  15    Number of Visits  36    Date for OT Re-Evaluation  07/07/17    OT Start Time  0204    OT Stop Time  0304    OT Time Calculation (min)  60 min    Activity Tolerance  Patient tolerated treatment well;No increased pain    Behavior During Therapy  WFL for tasks assessed/performed       Past Medical History:  Diagnosis Date  . Anxiety   . Cervicalgia   . Coronary artery disease    a. 02/2012 Stress echo: severe anterior wall ischemia;  b. 02/2012 Cath/PCI: LAD 95p (3.0 x 15 Xience EX DES), D1 90ost (PTCA - bifurcational dzs), EF 45% with anterior HK;  b. 02/2013 Ex MV: fixed anterior defect w/ minor reversibility, nl EF-->Med Rx.  . Endometrial cancer ([place])    a. 07/2016 s/p robotic hysterectomy, BSO w/ washings, sentinel node inj, mapping, bx, adhesiolysis.  . Essential hypertension, benign   . Fibrocystic breast disease   . GERD (gastroesophageal reflux disease)   . Gestational hypertension   . Heart murmur   . History of anemia   . History of blood transfusion   . Hodgkin's lymphoma ([place]) 2011   a. s/p radiation and chemo therapy  . Osteoarthritis   . Polycystic ovarian disease     Past Surgical History:  Procedure Laterality Date  . ABDOMINAL HYSTERECTOMY    . bladder sling    . CARDIAC CATHETERIZATION  02/2012   [facility] 1 stent place  . CERVICAL POLYPECTOMY    . CHOLECYSTECTOMY  1982  . COLONOSCOPY WITH PROPOFOL N/A 02/05/2015   Procedure: COLONOSCOPY WITH PROPOFOL;  Surgeon: Lucilla Lame, MD;  Location: [facility] ENDOSCOPY;  Service: Endoscopy;  Laterality: N/A;  . CORONARY  ANGIOPLASTY  02/2012   left/right s/p balloon  . CYSTOGRAM N/A 08/17/2016   Procedure: CYSTOGRAM;  Surgeon: Hollice Espy, MD;  Location: [facility] ORS;  Service: Urology;  Laterality: N/A;  . CYSTOSCOPY N/A 08/17/2016   Procedure: CYSTOSCOPY EXAM UNDER ANESTHESIA;  Surgeon: Hollice Espy, MD;  Location: [facility] ORS;  Service: Urology;  Laterality: N/A;  . CYSTOSCOPY W/ RETROGRADES Bilateral 08/17/2016   Procedure: CYSTOSCOPY WITH RETROGRADE PYELOGRAM;  Surgeon: Hollice Espy, MD;  Location: [facility] ORS;  Service: Urology;  Laterality: Bilateral;  . CYSTOSCOPY WITH STENT PLACEMENT Right 08/17/2016   Procedure: CYSTOSCOPY WITH STENT PLACEMENT;  Surgeon: Hollice Espy, MD;  Location: [facility] ORS;  Service: Urology;  Laterality: Right;  . heart stent'  2013  . kidney stent Right 2018  . LYMPH NODE BIOPSY  2011   diagnosis of hodgkins lymphoma  . PELVIC LYMPH NODE DISSECTION N/A 07/29/2016   Procedure: PELVIC/AORTIC LYMPH NODE SAMPLING;  Surgeon: [name], MD;  Location: [facility] ORS;  Service: Gynecology;  Laterality: N/A;  . PORTA CATH INSERTION N/A 09/22/2016   Procedure: Glori Luis Cath Insertion;  Surgeon: [name], MD;  Location: [place] CV LAB;  Service: Cardiovascular;  Laterality: N/A;  . PORTA CATH REMOVAL N/A 11/17/2016   Procedure: Glori Luis Cath Removal;  Surgeon: [name], MD;  Location: [place]  CV LAB;  Service: Cardiovascular;  Laterality: N/A;  . ROBOTIC ASSISTED TOTAL HYSTERECTOMY WITH BILATERAL SALPINGO OOPHERECTOMY N/A 07/29/2016   Procedure: ROBOTIC ASSISTED TOTAL HYSTERECTOMY WITH BILATERAL SALPINGO OOPHORECTOMY;  Surgeon: [name], MD;  Location: [facility] ORS;  Service: Gynecology;  Laterality: N/A;  . SENTINEL NODE BIOPSY N/A 07/29/2016   Procedure: SENTINEL NODE BIOPSY;  Surgeon: [name], MD;  Location: [facility] ORS;  Service: Gynecology;  Laterality: N/A;  . transobturator sling N/A 2009   [place]    There were no vitals filed for this  visit.  Subjective Assessment - 06/14/17 1701    Subjective   Pt presents for OT  visit 15/36 to address BLE lymphedema 2/2 lymphoma and endometrial cancer treatment. Pt reports her poncology follow up went well. "I wrapped my leg every day over the holidays. I think it looks pretty good today."    Pertinent History  CVI, PAD, Ultrasound negative for DVT and + for baker's cyst; chemo-induced periferal neuropathy, endometrial ca, pelvic adhesive disease, PAD, recent weight loss reported as ~ 25-30 pounds, hx Hodgkin's lymphoma ( s/p xrt and chemo), OA, abdominal hysterectomy, oophorectomy, pelvic LN disection(pelvic/ aortic node biopsy 0/3 LN), post op infection/ vaginal cuff abcess, severe pelvic adhesive disease, , s/p chemotherapy and whole pelvic radiation    Limitations  leg swelling, L>R) , BLE pain/ discomfort, decreased sensation / neuropathic pain in feet and hands, difficulty walking, decreased standing tolerance, decreased balance w/ increased falls risk, difficulty fitting LB clothing and preferred street shoes, mildly impaired functional mobility and transferes    Patient Stated Goals  decrease leg swelling and pain.     Currently in Pain?  No/denies    Pain Onset  Other (comment) post chemo                   OT Treatments/Exercises (OP) - 06/14/17 0001      ADLs   ADL Education Given  Yes      Manual Therapy   Manual Therapy  Edema management;Compression Bandaging;Manual Lymphatic Drainage (MLD)    Edema Management  skin care to LLE during MLD    Manual Lymphatic Drainage (MLD)  MLD to LLE/LLQ utilizing deep abdominal lymphatics and inguinal - axillary anastamoses in supine and sidelying.    Compression Bandaging  gradient compression wraps below knee only.             OT Education - 06/14/17 1702    Education provided  Yes    Education Details  Continued skilled Pt/caregiver education  And LE ADL training throughout visit for lymphedema self care/ home  program, including compression wrapping, compression garment and device wear/care, lymphatic pumping ther ex, simple self-MLD, and skin care. Discussed progress towards goals.    Person(s) Educated  Patient    Methods  Explanation;Demonstration    Comprehension  Verbalized understanding;Returned demonstration          OT Long Term Goals - 05/17/17 1509      OT LONG TERM GOAL #1   Title  Pt independent w/ lymphedema precautions and prevention principals and strategies to limit LE progression and infection risk.    Baseline  dependent    Time  2    Period  Weeks    Status  Achieved    Target Date  05/17/17      OT LONG TERM GOAL #2   Title  Lymphedema (LE) management/ self-care: Pt able to apply thigh length, multi layered, gradient compression wraps with modified  independence ( with extra time)  using proper techniques within 2 weeks to achieve optimal limb volume reductions in preparation for fitting compression garments/ devices bilaterally.    Baseline  dependent    Time  2    Period  Weeks    Status  Achieved    Target Date  05/17/17      OT LONG TERM GOAL #3   Title  Lymphedema (LE) management/ self-care:  Pt to achieve at least 10% LLE limb volume reductions  in the full LLE and 5% limb volume reduction in the RLE during Intensive Phase CDT to limit LE progression, to reduce pain, to improve ADLs performance.    Baseline  Max A    Time  12    Period  Weeks    Status  Partially Met      OT LONG TERM GOAL #4   Title  Lymphedema (LE) management/ self-care:  Pt to tolerate daily compression wraps, compression garments and/ or HOS devices in keeping w/ prescribed wear regime within 1 week of issue date of each to progress and retain clinical and functional gains and to limit LE progression.    Baseline  Max A    Time  12    Period  Weeks    Status  Partially Met      OT LONG TERM GOAL #5   Title  Lymphedema (LE) management/ self-care:  During Management Phase CDT Pt to  sustain current limb volumes within 5%, and all other clinical gains achieved during OT treatment WITH MAXIMUM CAREGIVER ASSISTANCE  to control leg and foot swelling, to  limit LE progression, to dec4rease infection risk and to limit further functional decline.    Baseline  dependent    Time  6    Period  Months    Status  On-going            Plan - 06/14/17 1703    Clinical Impression Statement  Pt tolerated all aspects of manual CDT without difficulty today. LLE does not appear decreased in limb volume by visiual assessment today; however, Pt reporting decreased physical discomfort/ pain assosiated with leg swelling. Pt demonstrates improved compliance with LE self care in regard to compression wrapping between visits. OT will continue to encourage increased involvement ins all aspects of LE self care, inclding simple self MLD. Con as per POC.    Occupational Profile and client history currently impacting functional performance  Pt tolerated all aspects of manual CDT today. Pt continues to build   increased tolerance and compliance with thigh length bandaging regime. ZPt agrees w/ plan to complete repeat LLE comparative limb volumetrics    next visit.    Occupational performance deficits (Please refer to evaluation for details):  ADL's;IADL's;Rest and Sleep;Work;Leisure;Social Participation;Other;Play body image    Rehab Potential  Good    OT Frequency  3x / week       Patient will benefit from skilled therapeutic intervention in order to improve the following deficits and impairments:     Visit Diagnosis: Lymphedema, not elsewhere classified    Problem List Patient Active Problem List   Diagnosis Date Noted  . Cerumen impaction 01/12/2017  . Chemotherapy-induced peripheral neuropathy ([place]) 12/10/2016  . 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 SIRS (systemic inflammatory response syndrome) ([place]) 08/05/2016  . Endometrial cancer ([place])   . Pelvic adhesive disease   . Lymphedema  07/20/2016  . Chronic venous insufficiency 07/20/2016  . PAD (peripheral artery disease) ([place]) 07/20/2016  . Postmenopausal  bleeding 07/14/2016  . Influenza 07/14/2016  . Class 1 obesity due to excess calories without serious comorbidity with body mass index (BMI) of 34.0 to 34.9 in adult 05/27/2016  . Chronic cough 12/19/2015  . Pre-syncope 12/19/2015  . Hx of colonic polyps   . Benign neoplasm of sigmoid colon   . Dysphagia, pharyngoesophageal phase 10/01/2014  . Prolapsed, uterovaginal, incomplete 06/24/2014  . Routine general medical examination at a health care facility 12/30/2012  . Obesity (BMI 30-39.9) 12/30/2012  . Hx of multiple pulmonary nodules 12/28/2012  . Plantar fasciitis of right foot 12/28/2012  . 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 Edema of both legs 09/12/2012  . Hip pain, bilateral 09/12/2012  . History of Hodgkin's lymphoma 09/12/2012  . Coronary artery disease   . Hyperlipidemia   . Chest pain on exertion 02/29/2012   Andrey Spearman, MS, OTR/L, CLT-LANA 06/14/17 5:07 PM   06/14/2017, 5:07 PM  [place] MAIN [facility] SERVICES [address] [place], [place], [address] Phone: [phone]   Fax:  [phone]  Name: Jaime Hall MRN: [id number] Date of Birth: 02/11/1951

## 2017-06-17 ENCOUNTER — Ambulatory Visit: Payer: PPO | Attending: Oncology | Admitting: Occupational Therapy

## 2017-06-17 DIAGNOSIS — I890 Lymphedema, not elsewhere classified: Secondary | ICD-10-CM | POA: Insufficient documentation

## 2017-06-17 NOTE — Therapy (Signed)
[place] MAIN [facility] SERVICES [address] [place], [place], [address] Phone: [phone]   Fax:  [phone]  Occupational Therapy Treatment  Patient Details  Name: Jaime Hall MRN: [id number] Date of Birth: [birth year] Referring Provider (Historical): Sindy Guadeloupe, MD   Encounter Date: 06/17/2017  OT End of Session - 06/17/17 1524    Visit Number  16    Number of Visits  36    Date for OT Re-Evaluation  07/07/17    OT Start Time  0205    OT Stop Time  0305    OT Time Calculation (min)  60 min    Activity Tolerance  Patient tolerated treatment well;No increased pain    Behavior During Therapy  WFL for tasks assessed/performed       Past Medical History:  Diagnosis Date  . Anxiety   . Cervicalgia   . Coronary artery disease    a. 02/2012 Stress echo: severe anterior wall ischemia;  b. 02/2012 Cath/PCI: LAD 95p (3.0 x 15 Xience EX DES), D1 90ost (PTCA - bifurcational dzs), EF 45% with anterior HK;  b. 02/2013 Ex MV: fixed anterior defect w/ minor reversibility, nl EF-->Med Rx.  . Endometrial cancer ([place])    a. 07/2016 s/p robotic hysterectomy, BSO w/ washings, sentinel node inj, mapping, bx, adhesiolysis.  . Essential hypertension, benign   . Fibrocystic breast disease   . GERD (gastroesophageal reflux disease)   . Gestational hypertension   . Heart murmur   . History of anemia   . History of blood transfusion   . Hodgkin's lymphoma ([place]) 2011   a. s/p radiation and chemo therapy  . Osteoarthritis   . Polycystic ovarian disease     Past Surgical History:  Procedure Laterality Date  . ABDOMINAL HYSTERECTOMY    . bladder sling    . CARDIAC CATHETERIZATION  02/2012   [facility] 1 stent place  . CERVICAL POLYPECTOMY    . CHOLECYSTECTOMY  1982  . COLONOSCOPY WITH PROPOFOL N/A 02/05/2015   Procedure: COLONOSCOPY WITH PROPOFOL;  Surgeon: Lucilla Lame, MD;  Location: [facility] ENDOSCOPY;  Service: Endoscopy;  Laterality: N/A;  . CORONARY  ANGIOPLASTY  02/2012   left/right s/p balloon  . CYSTOGRAM N/A 08/17/2016   Procedure: CYSTOGRAM;  Surgeon: Hollice Espy, MD;  Location: [facility] ORS;  Service: Urology;  Laterality: N/A;  . CYSTOSCOPY N/A 08/17/2016   Procedure: CYSTOSCOPY EXAM UNDER ANESTHESIA;  Surgeon: Hollice Espy, MD;  Location: [facility] ORS;  Service: Urology;  Laterality: N/A;  . CYSTOSCOPY W/ RETROGRADES Bilateral 08/17/2016   Procedure: CYSTOSCOPY WITH RETROGRADE PYELOGRAM;  Surgeon: Hollice Espy, MD;  Location: [facility] ORS;  Service: Urology;  Laterality: Bilateral;  . CYSTOSCOPY WITH STENT PLACEMENT Right 08/17/2016   Procedure: CYSTOSCOPY WITH STENT PLACEMENT;  Surgeon: Hollice Espy, MD;  Location: [facility] ORS;  Service: Urology;  Laterality: Right;  . heart stent'  2013  . kidney stent Right 2018  . LYMPH NODE BIOPSY  2011   diagnosis of hodgkins lymphoma  . PELVIC LYMPH NODE DISSECTION N/A 07/29/2016   Procedure: PELVIC/AORTIC LYMPH NODE SAMPLING;  Surgeon: [name], MD;  Location: [facility] ORS;  Service: Gynecology;  Laterality: N/A;  . PORTA CATH INSERTION N/A 09/22/2016   Procedure: Glori Luis Cath Insertion;  Surgeon: [name], MD;  Location: [place] CV LAB;  Service: Cardiovascular;  Laterality: N/A;  . PORTA CATH REMOVAL N/A 11/17/2016   Procedure: Glori Luis Cath Removal;  Surgeon: [name], MD;  Location: [place]  CV LAB;  Service: Cardiovascular;  Laterality: N/A;  . ROBOTIC ASSISTED TOTAL HYSTERECTOMY WITH BILATERAL SALPINGO OOPHERECTOMY N/A 07/29/2016   Procedure: ROBOTIC ASSISTED TOTAL HYSTERECTOMY WITH BILATERAL SALPINGO OOPHORECTOMY;  Surgeon: [name], MD;  Location: [facility] ORS;  Service: Gynecology;  Laterality: N/A;  . SENTINEL NODE BIOPSY N/A 07/29/2016   Procedure: SENTINEL NODE BIOPSY;  Surgeon: [name], MD;  Location: [facility] ORS;  Service: Gynecology;  Laterality: N/A;  . transobturator sling N/A 2009   [place]    There were no vitals filed for this  visit.  Subjective Assessment - 06/17/17 1520    Subjective   Pt presents for OT  visit 16/36 to address BLE lymphedema 2/2 lymphoma and endometrial cancer treatment. Pt reporting increased dizziness and pain onn top of her head over the last week. "It might be water in my ear, but that should be gone by now." Pt denies SOB or falls. Pt stated she plans to report new symptoms to her doctor ; OT encouaged her to report.    Pertinent History  CVI, PAD, Ultrasound negative for DVT and + for baker's cyst; chemo-induced periferal neuropathy, endometrial ca, pelvic adhesive disease, PAD, recent weight loss reported as ~ 25-30 pounds, hx Hodgkin's lymphoma ( s/p xrt and chemo), OA, abdominal hysterectomy, oophorectomy, pelvic LN disection(pelvic/ aortic node biopsy 0/3 LN), post op infection/ vaginal cuff abcess, severe pelvic adhesive disease, , s/p chemotherapy and whole pelvic radiation    Limitations  leg swelling, L>R) , BLE pain/ discomfort, decreased sensation / neuropathic pain in feet and hands, difficulty walking, decreased standing tolerance, decreased balance w/ increased falls risk, difficulty fitting LB clothing and preferred street shoes, mildly impaired functional mobility and transferes    Patient Stated Goals  decrease leg swelling and pain.     Currently in Pain?  No/denies    Pain Onset  Other (comment) post chemo                   OT Treatments/Exercises (OP) - 06/17/17 0001      ADLs   ADL Education Given  Yes      Manual Therapy   Manual Therapy  Edema management;Compression Bandaging;Manual Lymphatic Drainage (MLD)    Edema Management  skin care to LLE during MLD    Manual Lymphatic Drainage (MLD)  MLD to LLE/LLQ utilizing deep abdominal lymphatics and inguinal - axillary anastamoses in supine and sidelying.    Compression Bandaging  gradient compression wraps below knee only.             OT Education - 06/17/17 1523    Education provided  Yes     Education Details  Cont Pt edu for simple self MLD, including manual techniques and lymphatic structure and function related to techniques    Person(s) Educated  Patient    Methods  Explanation;Demonstration;Tactile cues;Verbal cues    Comprehension  Verbalized understanding;Returned demonstration;Verbal cues required;Tactile cues required;Need further instruction          OT Long Term Goals - 05/17/17 1509      OT LONG TERM GOAL #1   Title  Pt independent w/ lymphedema precautions and prevention principals and strategies to limit LE progression and infection risk.    Baseline  dependent    Time  2    Period  Weeks    Status  Achieved    Target Date  05/17/17      OT LONG TERM GOAL #2   Title  Lymphedema (LE)  management/ self-care: Pt able to apply thigh length, multi layered, gradient compression wraps with modified independence ( with extra time)  using proper techniques within 2 weeks to achieve optimal limb volume reductions in preparation for fitting compression garments/ devices bilaterally.    Baseline  dependent    Time  2    Period  Weeks    Status  Achieved    Target Date  05/17/17      OT LONG TERM GOAL #3   Title  Lymphedema (LE) management/ self-care:  Pt to achieve at least 10% LLE limb volume reductions  in the full LLE and 5% limb volume reduction in the RLE during Intensive Phase CDT to limit LE progression, to reduce pain, to improve ADLs performance.    Baseline  Max A    Time  12    Period  Weeks    Status  Partially Met      OT LONG TERM GOAL #4   Title  Lymphedema (LE) management/ self-care:  Pt to tolerate daily compression wraps, compression garments and/ or HOS devices in keeping w/ prescribed wear regime within 1 week of issue date of each to progress and retain clinical and functional gains and to limit LE progression.    Baseline  Max A    Time  12    Period  Weeks    Status  Partially Met      OT LONG TERM GOAL #5   Title  Lymphedema (LE)  management/ self-care:  During Management Phase CDT Pt to sustain current limb volumes within 5%, and all other clinical gains achieved during OT treatment WITH MAXIMUM CAREGIVER ASSISTANCE  to control leg and foot swelling, to  limit LE progression, to dec4rease infection risk and to limit further functional decline.    Baseline  dependent    Time  6    Period  Months    Status  On-going            Plan - 06/17/17 1526    Clinical Impression Statement  Pt tolerated all aspects of manual CDT without difficulty today.Pt continues to demonstrate improved compliance with compression wrapping ansd L:ER self care. Awaiting email response from DME vendor re custom compression garment to complete  measurement and fitting. Cont as per POC.    Occupational Profile and client history currently impacting functional performance  Pt tolerated all aspects of manual CDT today. Pt continues to build   increased tolerance and compliance with thigh length bandaging regime. ZPt agrees w/ plan to complete repeat LLE comparative limb volumetrics    next visit.    Occupational performance deficits (Please refer to evaluation for details):  ADL's;IADL's;Rest and Sleep;Work;Leisure;Social Participation;Other;Play body image    Rehab Potential  Good    OT Frequency  3x / week       Patient will benefit from skilled therapeutic intervention in order to improve the following deficits and impairments:     Visit Diagnosis: Lymphedema, not elsewhere classified    Problem List Patient Active Problem List   Diagnosis Date Noted  . Cerumen impaction 01/12/2017  . Chemotherapy-induced peripheral neuropathy ([place]) 12/10/2016  . 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 SIRS (systemic inflammatory response syndrome) ([place]) 08/05/2016  . Endometrial cancer ([place])   . Pelvic adhesive disease   . Lymphedema 07/20/2016  . Chronic venous insufficiency 07/20/2016  . PAD (peripheral artery disease) ([place]) 07/20/2016  .  Postmenopausal bleeding 07/14/2016  . Influenza 07/14/2016  . Class 1 obesity due to  excess calories without serious comorbidity with body mass index (BMI) of 34.0 to 34.9 in adult 05/27/2016  . Chronic cough 12/19/2015  . Pre-syncope 12/19/2015  . Hx of colonic polyps   . Benign neoplasm of sigmoid colon   . Dysphagia, pharyngoesophageal phase 10/01/2014  . Prolapsed, uterovaginal, incomplete 06/24/2014  . Routine general medical examination at a health care facility 12/30/2012  . Obesity (BMI 30-39.9) 12/30/2012  . Hx of multiple pulmonary nodules 12/28/2012  . Plantar fasciitis of right foot 12/28/2012  . 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 Edema of both legs 09/12/2012  . Hip pain, bilateral 09/12/2012  . History of Hodgkin's lymphoma 09/12/2012  . Coronary artery disease   . Hyperlipidemia   . Chest pain on exertion 02/29/2012    Ansel Bong 06/17/2017, 3:27 PM  [place] MAIN [facility] SERVICES [address] [place], [place], [address] Phone: [phone]   Fax:  [phone]  Name: Jaime Hall MRN: [id number] Date of Birth: June 19, 1950

## 2017-06-21 ENCOUNTER — Encounter: Payer: PPO | Admitting: Occupational Therapy

## 2017-06-22 ENCOUNTER — Ambulatory Visit: Payer: PPO | Admitting: Occupational Therapy

## 2017-06-24 ENCOUNTER — Ambulatory Visit: Payer: PPO | Admitting: Occupational Therapy

## 2017-06-24 DIAGNOSIS — I890 Lymphedema, not elsewhere classified: Secondary | ICD-10-CM | POA: Diagnosis not present

## 2017-06-24 NOTE — Therapy (Signed)
[place] MAIN [facility] SERVICES [address] [place], [place], [address] Phone: [phone]   Fax:  [phone]  Occupational Therapy Treatment  Patient Details  Name: Jaime Hall MRN: [id number] Date of Birth: [birth year] No Data Recorded  Encounter Date: 06/24/2017  OT End of Session - 06/24/17 1630    Visit Number  17    Number of Visits  36    Date for OT Re-Evaluation  07/07/17    OT Start Time  0200    OT Stop Time  0308    OT Time Calculation (min)  68 min    Activity Tolerance  Patient tolerated treatment well;No increased pain    Behavior During Therapy  WFL for tasks assessed/performed       Past Medical History:  Diagnosis Date  . Anxiety   . Cervicalgia   . Coronary artery disease    a. 02/2012 Stress echo: severe anterior wall ischemia;  b. 02/2012 Cath/PCI: LAD 95p (3.0 x 15 Xience EX DES), D1 90ost (PTCA - bifurcational dzs), EF 45% with anterior HK;  b. 02/2013 Ex MV: fixed anterior defect w/ minor reversibility, nl EF-->Med Rx.  . Endometrial cancer ([place])    a. 07/2016 s/p robotic hysterectomy, BSO w/ washings, sentinel node inj, mapping, bx, adhesiolysis.  . Essential hypertension, benign   . Fibrocystic breast disease   . GERD (gastroesophageal reflux disease)   . Gestational hypertension   . Heart murmur   . History of anemia   . History of blood transfusion   . Hodgkin's lymphoma ([place]) 2011   a. s/p radiation and chemo therapy  . Osteoarthritis   . Polycystic ovarian disease     Past Surgical History:  Procedure Laterality Date  . ABDOMINAL HYSTERECTOMY    . bladder sling    . CARDIAC CATHETERIZATION  02/2012   [facility] 1 stent place  . CERVICAL POLYPECTOMY    . CHOLECYSTECTOMY  1982  . COLONOSCOPY WITH PROPOFOL N/A 02/05/2015   Procedure: COLONOSCOPY WITH PROPOFOL;  Surgeon: Lucilla Lame, MD;  Location: [facility] ENDOSCOPY;  Service: Endoscopy;  Laterality: N/A;  . CORONARY ANGIOPLASTY  02/2012   left/right s/p  balloon  . CYSTOGRAM N/A 08/17/2016   Procedure: CYSTOGRAM;  Surgeon: Hollice Espy, MD;  Location: [facility] ORS;  Service: Urology;  Laterality: N/A;  . CYSTOSCOPY N/A 08/17/2016   Procedure: CYSTOSCOPY EXAM UNDER ANESTHESIA;  Surgeon: Hollice Espy, MD;  Location: [facility] ORS;  Service: Urology;  Laterality: N/A;  . CYSTOSCOPY W/ RETROGRADES Bilateral 08/17/2016   Procedure: CYSTOSCOPY WITH RETROGRADE PYELOGRAM;  Surgeon: Hollice Espy, MD;  Location: [facility] ORS;  Service: Urology;  Laterality: Bilateral;  . CYSTOSCOPY WITH STENT PLACEMENT Right 08/17/2016   Procedure: CYSTOSCOPY WITH STENT PLACEMENT;  Surgeon: Hollice Espy, MD;  Location: [facility] ORS;  Service: Urology;  Laterality: Right;  . heart stent'  2013  . kidney stent Right 2018  . LYMPH NODE BIOPSY  2011   diagnosis of hodgkins lymphoma  . PELVIC LYMPH NODE DISSECTION N/A 07/29/2016   Procedure: PELVIC/AORTIC LYMPH NODE SAMPLING;  Surgeon: [name], MD;  Location: [facility] ORS;  Service: Gynecology;  Laterality: N/A;  . PORTA CATH INSERTION N/A 09/22/2016   Procedure: Glori Luis Cath Insertion;  Surgeon: [name], MD;  Location: [place] CV LAB;  Service: Cardiovascular;  Laterality: N/A;  . PORTA CATH REMOVAL N/A 11/17/2016   Procedure: Glori Luis Cath Removal;  Surgeon: [name], MD;  Location: [place] CV LAB;  Service: Cardiovascular;  Laterality: N/A;  . ROBOTIC ASSISTED TOTAL HYSTERECTOMY WITH BILATERAL SALPINGO OOPHERECTOMY N/A 07/29/2016   Procedure: ROBOTIC ASSISTED TOTAL HYSTERECTOMY WITH BILATERAL SALPINGO OOPHORECTOMY;  Surgeon: [name], MD;  Location: [facility] ORS;  Service: Gynecology;  Laterality: N/A;  . SENTINEL NODE BIOPSY N/A 07/29/2016   Procedure: SENTINEL NODE BIOPSY;  Surgeon: [name], MD;  Location: [facility] ORS;  Service: Gynecology;  Laterality: N/A;  . transobturator sling N/A 2009   [place]    There were no vitals filed for this visit.  Subjective Assessment -  06/24/17 1632    Subjective   Pt presents for OT  visit 17/36 to address BLE lymphedema 2/2 lymphoma and endometrial cancer treatment. Pt reporting increased low back pain last 2 weeks. Pt reports oncology providers are aware. No new lymphedema complaints. She declines remeasuring for comprwession garment today. She is pleased that charitable funding was approved for custon thigh high.    Pertinent History  CVI, PAD, Ultrasound negative for DVT and + for baker's cyst; chemo-induced periferal neuropathy, endometrial ca, pelvic adhesive disease, PAD, recent weight loss reported as ~ 25-30 pounds, hx Hodgkin's lymphoma ( s/p xrt and chemo), OA, abdominal hysterectomy, oophorectomy, pelvic LN disection(pelvic/ aortic node biopsy 0/3 LN), post op infection/ vaginal cuff abcess, severe pelvic adhesive disease, , s/p chemotherapy and whole pelvic radiation    Limitations  leg swelling, L>R) , BLE pain/ discomfort, decreased sensation / neuropathic pain in feet and hands, difficulty walking, decreased standing tolerance, decreased balance w/ increased falls risk, difficulty fitting LB clothing and preferred street shoes, mildly impaired functional mobility and transferes    Patient Stated Goals  decrease leg swelling and pain.     Currently in Pain?  Yes not numerically rated    Pain Location  Back    Pain Onset  Other (comment) post chemo                   OT Treatments/Exercises (OP) - 06/24/17 0001      ADLs   ADL Education Given  Yes      Manual Therapy   Manual Therapy  Edema management;Compression Bandaging;Manual Lymphatic Drainage (MLD)    Edema Management  skin care to LLE during MLD    Manual Lymphatic Drainage (MLD)  MLD to LLE/LLQ utilizing deep abdominal lymphatics and inguinal - axillary anastamoses in supine and sidelying.    Compression Bandaging  gradient compression wraps foot to groin.             OT Education - 06/24/17 1635    Education provided  Yes     Education Details  Cont Pt edu for all aspects of LE self management throughout session    Person(s) Educated  Patient    Methods  Explanation;Demonstration    Comprehension  Verbalized understanding;Returned demonstration          OT Long Term Goals - 05/17/17 1509      OT LONG TERM GOAL #1   Title  Pt independent w/ lymphedema precautions and prevention principals and strategies to limit LE progression and infection risk.    Baseline  dependent    Time  2    Period  Weeks    Status  Achieved    Target Date  05/17/17      OT LONG TERM GOAL #2   Title  Lymphedema (LE) management/ self-care: Pt able to apply thigh length, multi layered, gradient compression wraps with modified independence ( with extra time)  using proper  techniques within 2 weeks to achieve optimal limb volume reductions in preparation for fitting compression garments/ devices bilaterally.    Baseline  dependent    Time  2    Period  Weeks    Status  Achieved    Target Date  05/17/17      OT LONG TERM GOAL #3   Title  Lymphedema (LE) management/ self-care:  Pt to achieve at least 10% LLE limb volume reductions  in the full LLE and 5% limb volume reduction in the RLE during Intensive Phase CDT to limit LE progression, to reduce pain, to improve ADLs performance.    Baseline  Max A    Time  12    Period  Weeks    Status  Partially Met      OT LONG TERM GOAL #4   Title  Lymphedema (LE) management/ self-care:  Pt to tolerate daily compression wraps, compression garments and/ or HOS devices in keeping w/ prescribed wear regime within 1 week of issue date of each to progress and retain clinical and functional gains and to limit LE progression.    Baseline  Max A    Time  12    Period  Weeks    Status  Partially Met      OT LONG TERM GOAL #5   Title  Lymphedema (LE) management/ self-care:  During Management Phase CDT Pt to sustain current limb volumes within 5%, and all other clinical gains achieved during OT  treatment WITH MAXIMUM CAREGIVER ASSISTANCE  to control leg and foot swelling, to  limit LE progression, to dec4rease infection risk and to limit further functional decline.    Baseline  dependent    Time  6    Period  Months    Status  On-going            Plan - 06/24/17 1636    Clinical Impression Statement  Pt experiencing pain in lower back with bed mobility during MLD. Will aim to limit need to move as much as possible at future visits. Pt had noo difficulty tolerating LLE MLD and compression wraps today. Cont as per POC. Check 0Anatomical measurements for b LLE next visit to ensure garment is good fit next visit, then subhmit final draft to DME vendor.    Occupational Profile and client history currently impacting functional performance  Pt tolerated all aspects of manual CDT today. Pt continues to build   increased tolerance and compliance with thigh length bandaging regime. ZPt agrees w/ plan to complete repeat LLE comparative limb volumetrics    next visit.    Occupational performance deficits (Please refer to evaluation for details):  ADL's;IADL's;Rest and Sleep;Work;Leisure;Social Participation;Other;Play body image    Rehab Potential  Good    OT Frequency  3x / week       Patient will benefit from skilled therapeutic intervention in order to improve the following deficits and impairments:     Visit Diagnosis: No diagnosis found.    Problem List Patient Active Problem List   Diagnosis Date Noted  . Cerumen impaction 01/12/2017  . Chemotherapy-induced peripheral neuropathy ([place]) 12/10/2016  . 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 SIRS (systemic inflammatory response syndrome) ([place]) 08/05/2016  . Endometrial cancer ([place])   . Pelvic adhesive disease   . Lymphedema 07/20/2016  . Chronic venous insufficiency 07/20/2016  . PAD (peripheral artery disease) ([place]) 07/20/2016  . Postmenopausal bleeding 07/14/2016  . Influenza 07/14/2016  . Class 1 obesity due to excess  calories without  serious comorbidity with body mass index (BMI) of 34.0 to 34.9 in adult 05/27/2016  . Chronic cough 12/19/2015  . Pre-syncope 12/19/2015  . Hx of colonic polyps   . Benign neoplasm of sigmoid colon   . Dysphagia, pharyngoesophageal phase 10/01/2014  . Prolapsed, uterovaginal, incomplete 06/24/2014  . Routine general medical examination at a health care facility 12/30/2012  . Obesity (BMI 30-39.9) 12/30/2012  . Hx of multiple pulmonary nodules 12/28/2012  . Plantar fasciitis of right foot 12/28/2012  . 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 Edema of both legs 09/12/2012  . Hip pain, bilateral 09/12/2012  . History of Hodgkin's lymphoma 09/12/2012  . Coronary artery disease   . Hyperlipidemia   . Chest pain on exertion 02/29/2012    Andrey Spearman, MS, OTR/L, [facility] 06/24/17 4:39 PM  [place] MAIN [facility] SERVICES [address] [place], [place], [address] Phone: [phone]   Fax:  [phone]  Name: Jaime Hall MRN: [id number] Date of Birth: [birth year]

## 2017-06-28 ENCOUNTER — Encounter: Payer: PPO | Admitting: Occupational Therapy

## 2017-06-29 ENCOUNTER — Ambulatory Visit: Payer: PPO | Admitting: Occupational Therapy

## 2017-06-29 DIAGNOSIS — I890 Lymphedema, not elsewhere classified: Secondary | ICD-10-CM

## 2017-06-29 NOTE — Therapy (Signed)
[place] MAIN [facility] SERVICES [address] [place], [place], [address] Phone: [phone]   Fax:  [phone]  Occupational Therapy Treatment  Patient Details  Name: Jaime Hall MRN: [id number] Date of Birth: 07-26-1950 No Data Recorded  Encounter Date: 06/29/2017  OT End of Session - 06/29/17 1627    Visit Number  18    Number of Visits  36    Date for OT Re-Evaluation  07/07/17    Authorization Type  visit 3 for 2019    Activity Tolerance  Patient tolerated treatment well;No increased pain    Behavior During Therapy  WFL for tasks assessed/performed       Past Medical History:  Diagnosis Date  . Anxiety   . Cervicalgia   . Coronary artery disease    a. 02/2012 Stress echo: severe anterior wall ischemia;  b. 02/2012 Cath/PCI: LAD 95p (3.0 x 15 Xience EX DES), D1 90ost (PTCA - bifurcational dzs), EF 45% with anterior HK;  b. 02/2013 Ex MV: fixed anterior defect w/ minor reversibility, nl EF-->Med Rx.  . Endometrial cancer ([place])    a. 07/2016 s/p robotic hysterectomy, BSO w/ washings, sentinel node inj, mapping, bx, adhesiolysis.  . Essential hypertension, benign   . Fibrocystic breast disease   . GERD (gastroesophageal reflux disease)   . Gestational hypertension   . Heart murmur   . History of anemia   . History of blood transfusion   . Hodgkin's lymphoma ([place]) 2011   a. s/p radiation and chemo therapy  . Osteoarthritis   . Polycystic ovarian disease     Past Surgical History:  Procedure Laterality Date  . ABDOMINAL HYSTERECTOMY    . bladder sling    . CARDIAC CATHETERIZATION  02/2012   [facility] 1 stent place  . CERVICAL POLYPECTOMY    . CHOLECYSTECTOMY  1982  . COLONOSCOPY WITH PROPOFOL N/A 02/05/2015   Procedure: COLONOSCOPY WITH PROPOFOL;  Surgeon: Lucilla Lame, MD;  Location: [facility] ENDOSCOPY;  Service: Endoscopy;  Laterality: N/A;  . CORONARY ANGIOPLASTY  02/2012   left/right s/p balloon  . CYSTOGRAM N/A 08/17/2016   Procedure: CYSTOGRAM;  Surgeon: Hollice Espy, MD;  Location: [facility] ORS;  Service: Urology;  Laterality: N/A;  . CYSTOSCOPY N/A 08/17/2016   Procedure: CYSTOSCOPY EXAM UNDER ANESTHESIA;  Surgeon: Hollice Espy, MD;  Location: [facility] ORS;  Service: Urology;  Laterality: N/A;  . CYSTOSCOPY W/ RETROGRADES Bilateral 08/17/2016   Procedure: CYSTOSCOPY WITH RETROGRADE PYELOGRAM;  Surgeon: Hollice Espy, MD;  Location: [facility] ORS;  Service: Urology;  Laterality: Bilateral;  . CYSTOSCOPY WITH STENT PLACEMENT Right 08/17/2016   Procedure: CYSTOSCOPY WITH STENT PLACEMENT;  Surgeon: Hollice Espy, MD;  Location: [facility] ORS;  Service: Urology;  Laterality: Right;  . heart stent'  2013  . kidney stent Right 2018  . LYMPH NODE BIOPSY  2011   diagnosis of hodgkins lymphoma  . PELVIC LYMPH NODE DISSECTION N/A 07/29/2016   Procedure: PELVIC/AORTIC LYMPH NODE SAMPLING;  Surgeon: [name], MD;  Location: [facility] ORS;  Service: Gynecology;  Laterality: N/A;  . PORTA CATH INSERTION N/A 09/22/2016   Procedure: Glori Luis Cath Insertion;  Surgeon: [name], MD;  Location: [place] CV LAB;  Service: Cardiovascular;  Laterality: N/A;  . PORTA CATH REMOVAL N/A 11/17/2016   Procedure: Glori Luis Cath Removal;  Surgeon: [name], MD;  Location: [place] CV LAB;  Service: Cardiovascular;  Laterality: N/A;  . ROBOTIC ASSISTED TOTAL HYSTERECTOMY WITH BILATERAL SALPINGO OOPHERECTOMY N/A 07/29/2016  Procedure: ROBOTIC ASSISTED TOTAL HYSTERECTOMY WITH BILATERAL SALPINGO OOPHORECTOMY;  Surgeon: [name], MD;  Location: [facility] ORS;  Service: Gynecology;  Laterality: N/A;  . SENTINEL NODE BIOPSY N/A 07/29/2016   Procedure: SENTINEL NODE BIOPSY;  Surgeon: [name], MD;  Location: [facility] ORS;  Service: Gynecology;  Laterality: N/A;  . transobturator sling N/A 2009   [place]    There were no vitals filed for this visit.  Subjective Assessment - 06/29/17 1626    Subjective   Pt presents  for OT  visit 18/36 to address BLE lymphedema 2/2 lymphoma and endometrial cancer treatment. Pt reporting increased low back pain last 2 weeks. Pt reports oncology providers are aware. No new lymphedema complaints. She declines remeasuring for comprwession garment today. She is pleased that charitable funding was approved for custon thigh high.    Pertinent History  CVI, PAD, Ultrasound negative for DVT and + for baker's cyst; chemo-induced periferal neuropathy, endometrial ca, pelvic adhesive disease, PAD, recent weight loss reported as ~ 25-30 pounds, hx Hodgkin's lymphoma ( s/p xrt and chemo), OA, abdominal hysterectomy, oophorectomy, pelvic LN disection(pelvic/ aortic node biopsy 0/3 LN), post op infection/ vaginal cuff abcess, severe pelvic adhesive disease, , s/p chemotherapy and whole pelvic radiation    Limitations  leg swelling, L>R) , BLE pain/ discomfort, decreased sensation / neuropathic pain in feet and hands, difficulty walking, decreased standing tolerance, decreased balance w/ increased falls risk, difficulty fitting LB clothing and preferred street shoes, mildly impaired functional mobility and transferes    Patient Stated Goals  decrease leg swelling and pain.     Pain Onset  Other (comment) post chemo                   OT Treatments/Exercises (OP) - 06/29/17 0001      ADLs   ADL Education Given  Yes      Manual Therapy   Manual Therapy  Edema management    Manual therapy comments  checked compression garment measurements and adjusted several circumferences down as limb volume is decreased today. Faxed changes to DME vendor    Edema Management  skin care to LLE during MLD    Manual Lymphatic Drainage (MLD)  MLD to LLE/LLQ utilizing deep abdominal lymphatics and inguinal - axillary anastamoses in supine and sidelying.    Compression Bandaging  gradient compression wraps foot to groin.             OT Education - 06/29/17 1628    Education provided  Yes     Education Details  Cont Pt edu for LE self care protocols, including simple self MLD, skin care, ther ex and compression therapies    Person(s) Educated  Patient    Methods  Explanation;Demonstration    Comprehension  Verbalized understanding;Returned demonstration          OT Long Term Goals - 05/17/17 1509      OT LONG TERM GOAL #1   Title  Pt independent w/ lymphedema precautions and prevention principals and strategies to limit LE progression and infection risk.    Baseline  dependent    Time  2    Period  Weeks    Status  Achieved    Target Date  05/17/17      OT LONG TERM GOAL #2   Title  Lymphedema (LE) management/ self-care: Pt able to apply thigh length, multi layered, gradient compression wraps with modified independence ( with extra time)  using proper techniques within 2 weeks  to achieve optimal limb volume reductions in preparation for fitting compression garments/ devices bilaterally.    Baseline  dependent    Time  2    Period  Weeks    Status  Achieved    Target Date  05/17/17      OT LONG TERM GOAL #3   Title  Lymphedema (LE) management/ self-care:  Pt to achieve at least 10% LLE limb volume reductions  in the full LLE and 5% limb volume reduction in the RLE during Intensive Phase CDT to limit LE progression, to reduce pain, to improve ADLs performance.    Baseline  Max A    Time  12    Period  Weeks    Status  Partially Met      OT LONG TERM GOAL #4   Title  Lymphedema (LE) management/ self-care:  Pt to tolerate daily compression wraps, compression garments and/ or HOS devices in keeping w/ prescribed wear regime within 1 week of issue date of each to progress and retain clinical and functional gains and to limit LE progression.    Baseline  Max A    Time  12    Period  Weeks    Status  Partially Met      OT LONG TERM GOAL #5   Title  Lymphedema (LE) management/ self-care:  During Management Phase CDT Pt to sustain current limb volumes within 5%, and all  other clinical gains achieved during OT treatment WITH MAXIMUM CAREGIVER ASSISTANCE  to control leg and foot swelling, to  limit LE progression, to dec4rease infection risk and to limit further functional decline.    Baseline  dependent    Time  6    Period  Months    Status  On-going            Plan - 06/29/17 1633    Clinical Impression Statement  Pt continues to have lower back pain today and had mild difficulty with bed mobility repositioning from supine to sidelying and vice versa. during MLD. Rechecked anatomical measurements for custm compression garment and adjusted measurements to reflect slight reductions in circumferences at several landmarks. Volume well controlled when Pt is more compliant w compression between visits. Will commence CDT to LLE       PRN after RLE garment is fitted. Cont as per POC.    Occupational Profile and client history currently impacting functional performance  Pt tolerated all aspects of manual CDT today. Pt continues to build   increased tolerance and compliance with thigh length bandaging regime. ZPt agrees w/ plan to complete repeat LLE comparative limb volumetrics    next visit.    Occupational performance deficits (Please refer to evaluation for details):  ADL's;IADL's;Rest and Sleep;Work;Leisure;Social Participation;Other;Play body image    Rehab Potential  Good    OT Frequency  3x / week       Patient will benefit from skilled therapeutic intervention in order to improve the following deficits and impairments:     Visit Diagnosis: Lymphedema, not elsewhere classified    Problem List Patient Active Problem List   Diagnosis Date Noted  . Cerumen impaction 01/12/2017  . Chemotherapy-induced peripheral neuropathy ([place]) 12/10/2016  . 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 SIRS (systemic inflammatory response syndrome) ([place]) 08/05/2016  . Endometrial cancer ([place])   . Pelvic adhesive disease   . Lymphedema 07/20/2016  . Chronic venous  insufficiency 07/20/2016  . PAD (peripheral artery disease) ([place]) 07/20/2016  . Postmenopausal bleeding 07/14/2016  . Influenza  07/14/2016  . Class 1 obesity due to excess calories without serious comorbidity with body mass index (BMI) of 34.0 to 34.9 in adult 05/27/2016  . Chronic cough 12/19/2015  . Pre-syncope 12/19/2015  . Hx of colonic polyps   . Benign neoplasm of sigmoid colon   . Dysphagia, pharyngoesophageal phase 10/01/2014  . Prolapsed, uterovaginal, incomplete 06/24/2014  . Routine general medical examination at a health care facility 12/30/2012  . Obesity (BMI 30-39.9) 12/30/2012  . Hx of multiple pulmonary nodules 12/28/2012  . Plantar fasciitis of right foot 12/28/2012  . 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 Edema of both legs 09/12/2012  . Hip pain, bilateral 09/12/2012  . History of Hodgkin's lymphoma 09/12/2012  . Coronary artery disease   . Hyperlipidemia   . Chest pain on exertion 02/29/2012   Andrey Spearman, MS, OTR/L, [facility] 06/29/17 4:38 PM  [place] MAIN [facility] SERVICES [address] [place], [place], [address] Phone: [phone]   Fax:  [phone]  Name: Jaime Hall MRN: [id number] Date of Birth: [birth year]

## 2017-07-01 ENCOUNTER — Ambulatory Visit: Payer: PPO | Admitting: Occupational Therapy

## 2017-07-01 DIAGNOSIS — I890 Lymphedema, not elsewhere classified: Secondary | ICD-10-CM

## 2017-07-01 NOTE — Therapy (Signed)
[place] MAIN [facility] SERVICES [address] [place], [place], [address] Phone: [phone]   Fax:  [phone]  Occupational Therapy Treatment  Patient Details  Name: Jaime Hall MRN: [id number] Date of Birth: 1951/02/11 No Data Recorded  Encounter Date: 07/01/2017  OT End of Session - 07/01/17 1617    Visit Number  19    Number of Visits  36    Date for OT Re-Evaluation  07/07/17    Authorization Type  visit 5 for 2019    OT Start Time  0200    OT Stop Time  0300    OT Time Calculation (min)  60 min    Activity Tolerance  Patient tolerated treatment well;No increased pain    Behavior During Therapy  WFL for tasks assessed/performed       Past Medical History:  Diagnosis Date  . Anxiety   . Cervicalgia   . Coronary artery disease    a. 02/2012 Stress echo: severe anterior wall ischemia;  b. 02/2012 Cath/PCI: LAD 95p (3.0 x 15 Xience EX DES), D1 90ost (PTCA - bifurcational dzs), EF 45% with anterior HK;  b. 02/2013 Ex MV: fixed anterior defect w/ minor reversibility, nl EF-->Med Rx.  . Endometrial cancer ([place])    a. 07/2016 s/p robotic hysterectomy, BSO w/ washings, sentinel node inj, mapping, bx, adhesiolysis.  . Essential hypertension, benign   . Fibrocystic breast disease   . GERD (gastroesophageal reflux disease)   . Gestational hypertension   . Heart murmur   . History of anemia   . History of blood transfusion   . Hodgkin's lymphoma ([place]) 2011   a. s/p radiation and chemo therapy  . Osteoarthritis   . Polycystic ovarian disease     Past Surgical History:  Procedure Laterality Date  . ABDOMINAL HYSTERECTOMY    . bladder sling    . CARDIAC CATHETERIZATION  02/2012   [facility] 1 stent place  . CERVICAL POLYPECTOMY    . CHOLECYSTECTOMY  1982  . COLONOSCOPY WITH PROPOFOL N/A 02/05/2015   Procedure: COLONOSCOPY WITH PROPOFOL;  Surgeon: Lucilla Lame, MD;  Location: [facility] ENDOSCOPY;  Service: Endoscopy;  Laterality: N/A;  . CORONARY  ANGIOPLASTY  02/2012   left/right s/p balloon  . CYSTOGRAM N/A 08/17/2016   Procedure: CYSTOGRAM;  Surgeon: Hollice Espy, MD;  Location: [facility] ORS;  Service: Urology;  Laterality: N/A;  . CYSTOSCOPY N/A 08/17/2016   Procedure: CYSTOSCOPY EXAM UNDER ANESTHESIA;  Surgeon: Hollice Espy, MD;  Location: [facility] ORS;  Service: Urology;  Laterality: N/A;  . CYSTOSCOPY W/ RETROGRADES Bilateral 08/17/2016   Procedure: CYSTOSCOPY WITH RETROGRADE PYELOGRAM;  Surgeon: Hollice Espy, MD;  Location: [facility] ORS;  Service: Urology;  Laterality: Bilateral;  . CYSTOSCOPY WITH STENT PLACEMENT Right 08/17/2016   Procedure: CYSTOSCOPY WITH STENT PLACEMENT;  Surgeon: Hollice Espy, MD;  Location: [facility] ORS;  Service: Urology;  Laterality: Right;  . heart stent'  2013  . kidney stent Right 2018  . LYMPH NODE BIOPSY  2011   diagnosis of hodgkins lymphoma  . PELVIC LYMPH NODE DISSECTION N/A 07/29/2016   Procedure: PELVIC/AORTIC LYMPH NODE SAMPLING;  Surgeon: [name], MD;  Location: [facility] ORS;  Service: Gynecology;  Laterality: N/A;  . PORTA CATH INSERTION N/A 09/22/2016   Procedure: Glori Luis Cath Insertion;  Surgeon: [name], MD;  Location: [place] CV LAB;  Service: Cardiovascular;  Laterality: N/A;  . PORTA CATH REMOVAL N/A 11/17/2016   Procedure: Glori Luis Cath Removal;  Surgeon: [name],  MD;  Location: [place] CV LAB;  Service: Cardiovascular;  Laterality: N/A;  . ROBOTIC ASSISTED TOTAL HYSTERECTOMY WITH BILATERAL SALPINGO OOPHERECTOMY N/A 07/29/2016   Procedure: ROBOTIC ASSISTED TOTAL HYSTERECTOMY WITH BILATERAL SALPINGO OOPHORECTOMY;  Surgeon: [name], MD;  Location: [facility] ORS;  Service: Gynecology;  Laterality: N/A;  . SENTINEL NODE BIOPSY N/A 07/29/2016   Procedure: SENTINEL NODE BIOPSY;  Surgeon: [name], MD;  Location: [facility] ORS;  Service: Gynecology;  Laterality: N/A;  . transobturator sling N/A 2009   [place]    There were no vitals filed for this  visit.  Subjective Assessment - 07/01/17 1614    Subjective   Pt presents for OT  visit 19/36 to address BLE lymphedema 2/2 lymphoma and endometrial cancer treatment. Pt reporting increased low back pain last 2 weeks. Pt reports oncology providers are aware. No new lymphedema complaints. She declines remeasuring for comprwession garment today. Pt has no new complaints. She continues to have back pain in lower back with transfers and bed mobility in clinic.    Pertinent History  CVI, PAD, Ultrasound negative for DVT and + for baker's cyst; chemo-induced periferal neuropathy, endometrial ca, pelvic adhesive disease, PAD, recent weight loss reported as ~ 25-30 pounds, hx Hodgkin's lymphoma ( s/p xrt and chemo), OA, abdominal hysterectomy, oophorectomy, pelvic LN disection(pelvic/ aortic node biopsy 0/3 LN), post op infection/ vaginal cuff abcess, severe pelvic adhesive disease, , s/p chemotherapy and whole pelvic radiation    Limitations  leg swelling, L>R) , BLE pain/ discomfort, decreased sensation / neuropathic pain in feet and hands, difficulty walking, decreased standing tolerance, decreased balance w/ increased falls risk, difficulty fitting LB clothing and preferred street shoes, mildly impaired functional mobility and transferes    Patient Stated Goals  decrease leg swelling and pain.     Currently in Pain?  Yes low back pain w/ movement- not rated numerically    Pain Onset  Other (comment) post chemo                   OT Treatments/Exercises (OP) - 07/01/17 0001      ADLs   ADL Education Given  Yes      Manual Therapy   Manual Therapy  Edema management;Manual Lymphatic Drainage (MLD);Compression Bandaging    Edema Management  skin care to LLE during MLD    Manual Lymphatic Drainage (MLD)  MLD to LLE/LLQ utilizing deep abdominal lymphatics and inguinal - axillary anastamoses in supine and sidelying.    Compression Bandaging  gradient compression wraps foot to groin.              OT Education - 07/01/17 1617    Education provided  Yes    Education Details  Continued skilled Pt/caregiver education  And LE ADL training throughout visit for lymphedema self care/ home program, including compression wrapping, compression garment and device wear/care, lymphatic pumping ther ex, simple self-MLD, and skin care. Discussed progress towards goals.    Person(s) Educated  Patient    Methods  Explanation;Demonstration    Comprehension  Returned demonstration;Verbalized understanding          OT Long Term Goals - 05/17/17 1509      OT LONG TERM GOAL #1   Title  Pt independent w/ lymphedema precautions and prevention principals and strategies to limit LE progression and infection risk.    Baseline  dependent    Time  2    Period  Weeks    Status  Achieved  Target Date  05/17/17      OT LONG TERM GOAL #2   Title  Lymphedema (LE) management/ self-care: Pt able to apply thigh length, multi layered, gradient compression wraps with modified independence ( with extra time)  using proper techniques within 2 weeks to achieve optimal limb volume reductions in preparation for fitting compression garments/ devices bilaterally.    Baseline  dependent    Time  2    Period  Weeks    Status  Achieved    Target Date  05/17/17      OT LONG TERM GOAL #3   Title  Lymphedema (LE) management/ self-care:  Pt to achieve at least 10% LLE limb volume reductions  in the full LLE and 5% limb volume reduction in the RLE during Intensive Phase CDT to limit LE progression, to reduce pain, to improve ADLs performance.    Baseline  Max A    Time  12    Period  Weeks    Status  Partially Met      OT LONG TERM GOAL #4   Title  Lymphedema (LE) management/ self-care:  Pt to tolerate daily compression wraps, compression garments and/ or HOS devices in keeping w/ prescribed wear regime within 1 week of issue date of each to progress and retain clinical and functional gains and to limit  LE progression.    Baseline  Max A    Time  12    Period  Weeks    Status  Partially Met      OT LONG TERM GOAL #5   Title  Lymphedema (LE) management/ self-care:  During Management Phase CDT Pt to sustain current limb volumes within 5%, and all other clinical gains achieved during OT treatment WITH MAXIMUM CAREGIVER ASSISTANCE  to control leg and foot swelling, to  limit LE progression, to dec4rease infection risk and to limit further functional decline.    Baseline  dependent    Time  6    Period  Months    Status  On-going            Plan - 07/01/17 1618    Clinical Impression Statement  Pt had no difficulty tolerating MLD to LLE today. Performed all manual therapy and distal  leg wrapping in supine to eliminate need for extra bed mobil;ity to limit back   pain. Pt continues to make progress towards all OT goals for LE care. Custom LLE thigh high garment has been ordered by DME vendor and we are awaiting delivery for fitting. Cont as per POC w/ emphasis on LLE CDT.    Occupational Profile and client history currently impacting functional performance  Pt tolerated all aspects of manual CDT today. Pt continues to build   increased tolerance and compliance with thigh length bandaging regime. ZPt agrees w/ plan to complete repeat LLE comparative limb volumetrics    next visit.    Occupational performance deficits (Please refer to evaluation for details):  ADL's;IADL's;Rest and Sleep;Work;Leisure;Social Participation;Other;Play body image    Rehab Potential  Good    OT Frequency  3x / week       Patient will benefit from skilled therapeutic intervention in order to improve the following deficits and impairments:     Visit Diagnosis: Lymphedema    Problem List Patient Active Problem List   Diagnosis Date Noted  . Cerumen impaction 01/12/2017  . Chemotherapy-induced peripheral neuropathy ([place]) 12/10/2016  . 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 SIRS (systemic inflammatory  response syndrome) (H[facility])  08/05/2016  . Endometrial cancer ([place])   . Pelvic adhesive disease   . Lymphedema 07/20/2016  . Chronic venous insufficiency 07/20/2016  . PAD (peripheral artery disease) ([place]) 07/20/2016  . Postmenopausal bleeding 07/14/2016  . Influenza 07/14/2016  . Class 1 obesity due to excess calories without serious comorbidity with body mass index (BMI) of 34.0 to 34.9 in adult 05/27/2016  . Chronic cough 12/19/2015  . Pre-syncope 12/19/2015  . Hx of colonic polyps   . Benign neoplasm of sigmoid colon   . Dysphagia, pharyngoesophageal phase 10/01/2014  . Prolapsed, uterovaginal, incomplete 06/24/2014  . Routine general medical examination at a health care facility 12/30/2012  . Obesity (BMI 30-39.9) 12/30/2012  . Hx of multiple pulmonary nodules 12/28/2012  . Plantar fasciitis of right foot 12/28/2012  . 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 Edema of both legs 09/12/2012  . Hip pain, bilateral 09/12/2012  . History of Hodgkin's lymphoma 09/12/2012  . Coronary artery disease   . Hyperlipidemia   . Chest pain on exertion 02/29/2012    Andrey Spearman, MS, OTR/L, [facility] 07/01/17 4:21 PM  [place] MAIN [facility] SERVICES [address] [place], [place], [address] Phone: [phone]   Fax:  [phone]  Name: Jaime Hall MRN: [id number] Date of Birth: 10-30-1950

## 2017-07-03 ENCOUNTER — Other Ambulatory Visit: Payer: Self-pay | Admitting: Cardiovascular Disease

## 2017-07-05 ENCOUNTER — Encounter: Payer: PPO | Admitting: Occupational Therapy

## 2017-07-06 ENCOUNTER — Ambulatory Visit: Payer: PPO | Admitting: Occupational Therapy

## 2017-07-06 DIAGNOSIS — M5137 Other intervertebral disc degeneration, lumbosacral region: Secondary | ICD-10-CM | POA: Diagnosis not present

## 2017-07-06 DIAGNOSIS — M9903 Segmental and somatic dysfunction of lumbar region: Secondary | ICD-10-CM | POA: Diagnosis not present

## 2017-07-08 ENCOUNTER — Ambulatory Visit: Payer: PPO | Admitting: Occupational Therapy

## 2017-07-08 DIAGNOSIS — I890 Lymphedema, not elsewhere classified: Secondary | ICD-10-CM

## 2017-07-08 NOTE — Therapy (Signed)
[place] MAIN [facility] SERVICES [address] [place], [place], [address] Phone: [phone]   Fax:  [phone]  Occupational Therapy Treatment  Patient Details  Name: Jaime Hall MRN: [id number] Date of Birth: [birth year] No Data Recorded  Encounter Date: 07/08/2017  OT End of Session - 07/08/17 1617    Visit Number  20    Number of Visits  36    Date for OT Re-Evaluation  07/07/17    Authorization Type  visit 5 for 2019    OT Start Time  0209    OT Stop Time  0300    OT Time Calculation (min)  51 min    Activity Tolerance  Patient tolerated treatment well;No increased pain    Behavior During Therapy  WFL for tasks assessed/performed       Past Medical History:  Diagnosis Date  . Anxiety   . Cervicalgia   . Coronary artery disease    a. 02/2012 Stress echo: severe anterior wall ischemia;  b. 02/2012 Cath/PCI: LAD 95p (3.0 x 15 Xience EX DES), D1 90ost (PTCA - bifurcational dzs), EF 45% with anterior HK;  b. 02/2013 Ex MV: fixed anterior defect w/ minor reversibility, nl EF-->Med Rx.  . Endometrial cancer ([place])    a. 07/2016 s/p robotic hysterectomy, BSO w/ washings, sentinel node inj, mapping, bx, adhesiolysis.  . Essential hypertension, benign   . Fibrocystic breast disease   . GERD (gastroesophageal reflux disease)   . Gestational hypertension   . Heart murmur   . History of anemia   . History of blood transfusion   . Hodgkin's lymphoma ([place]) 2011   a. s/p radiation and chemo therapy  . Osteoarthritis   . Polycystic ovarian disease     Past Surgical History:  Procedure Laterality Date  . ABDOMINAL HYSTERECTOMY    . bladder sling    . CARDIAC CATHETERIZATION  02/2012   [facility] 1 stent place  . CERVICAL POLYPECTOMY    . CHOLECYSTECTOMY  1982  . COLONOSCOPY WITH PROPOFOL N/A 02/05/2015   Procedure: COLONOSCOPY WITH PROPOFOL;  Surgeon: Lucilla Lame, MD;  Location: [facility] ENDOSCOPY;  Service: Endoscopy;  Laterality: N/A;  . CORONARY  ANGIOPLASTY  02/2012   left/right s/p balloon  . CYSTOGRAM N/A 08/17/2016   Procedure: CYSTOGRAM;  Surgeon: Hollice Espy, MD;  Location: [facility] ORS;  Service: Urology;  Laterality: N/A;  . CYSTOSCOPY N/A 08/17/2016   Procedure: CYSTOSCOPY EXAM UNDER ANESTHESIA;  Surgeon: Hollice Espy, MD;  Location: [facility] ORS;  Service: Urology;  Laterality: N/A;  . CYSTOSCOPY W/ RETROGRADES Bilateral 08/17/2016   Procedure: CYSTOSCOPY WITH RETROGRADE PYELOGRAM;  Surgeon: Hollice Espy, MD;  Location: [facility] ORS;  Service: Urology;  Laterality: Bilateral;  . CYSTOSCOPY WITH STENT PLACEMENT Right 08/17/2016   Procedure: CYSTOSCOPY WITH STENT PLACEMENT;  Surgeon: Hollice Espy, MD;  Location: [facility] ORS;  Service: Urology;  Laterality: Right;  . heart stent'  2013  . kidney stent Right 2018  . LYMPH NODE BIOPSY  2011   diagnosis of hodgkins lymphoma  . PELVIC LYMPH NODE DISSECTION N/A 07/29/2016   Procedure: PELVIC/AORTIC LYMPH NODE SAMPLING;  Surgeon: [name], MD;  Location: [facility] ORS;  Service: Gynecology;  Laterality: N/A;  . PORTA CATH INSERTION N/A 09/22/2016   Procedure: Glori Luis Cath Insertion;  Surgeon: [name], MD;  Location: [place] CV LAB;  Service: Cardiovascular;  Laterality: N/A;  . PORTA CATH REMOVAL N/A 11/17/2016   Procedure: Glori Luis Cath Removal;  Surgeon: [name],  MD;  Location: [place] CV LAB;  Service: Cardiovascular;  Laterality: N/A;  . ROBOTIC ASSISTED TOTAL HYSTERECTOMY WITH BILATERAL SALPINGO OOPHERECTOMY N/A 07/29/2016   Procedure: ROBOTIC ASSISTED TOTAL HYSTERECTOMY WITH BILATERAL SALPINGO OOPHORECTOMY;  Surgeon: [name], MD;  Location: [facility] ORS;  Service: Gynecology;  Laterality: N/A;  . SENTINEL NODE BIOPSY N/A 07/29/2016   Procedure: SENTINEL NODE BIOPSY;  Surgeon: [name], MD;  Location: [facility] ORS;  Service: Gynecology;  Laterality: N/A;  . transobturator sling N/A 2009   [place]    There were no vitals filed for this  visit.  Subjective Assessment - 07/08/17 1615    Subjective   Pt presents for OT  visit 20/36 to address BLE lymphedema 2/2 lymphoma and endometrial cancer treatment. Pt reporting iongoing back pain with some improvement after chiropractor visit earlier in the week.    Pertinent History  CVI, PAD, Ultrasound negative for DVT and + for baker's cyst; chemo-induced periferal neuropathy, endometrial ca, pelvic adhesive disease, PAD, recent weight loss reported as ~ 25-30 pounds, hx Hodgkin's lymphoma ( s/p xrt and chemo), OA, abdominal hysterectomy, oophorectomy, pelvic LN disection(pelvic/ aortic node biopsy 0/3 LN), post op infection/ vaginal cuff abcess, severe pelvic adhesive disease, , s/p chemotherapy and whole pelvic radiation    Limitations  leg swelling, L>R) , BLE pain/ discomfort, decreased sensation / neuropathic pain in feet and hands, difficulty walking, decreased standing tolerance, decreased balance w/ increased falls risk, difficulty fitting LB clothing and preferred street shoes, mildly impaired functional mobility and transferes    Patient Stated Goals  decrease leg swelling and pain.     Currently in Pain?  Yes low back pain. not rated numerically. provided hot pack during MLD    Pain Location  Back    Pain Onset  Other (comment) post chemo                   OT Treatments/Exercises (OP) - 07/08/17 0001      ADLs   ADL Education Given  Yes      Manual Therapy   Manual Therapy  Edema management;Manual Lymphatic Drainage (MLD);Compression Bandaging    Edema Management  skin care to LLE during MLD    Manual Lymphatic Drainage (MLD)  MLD to LLE/LLQ utilizing deep abdominal lymphatics and inguinal - axillary anastamoses in supine and sidelying.    Compression Bandaging  gradient compression wraps foot to groin.             OT Education - 07/08/17 1617    Education provided  Yes    Education Details  Continued skilled Pt/caregiver education  And LE ADL training  throughout visit for lymphedema self care/ home program, including compression wrapping, compression garment and device wear/care, lymphatic pumping ther ex, simple self-MLD, and skin care. Discussed progress towards goals.    Person(s) Educated  Patient    Methods  Explanation;Demonstration    Comprehension  Verbalized understanding;Returned demonstration          OT Long Term Goals - 07/08/17 1620      OT LONG TERM GOAL #1   Title  Pt independent w/ lymphedema precautions and prevention principals and strategies to limit LE progression and infection risk.    Baseline  dependent    Time  2    Period  Weeks    Status  Achieved      OT LONG TERM GOAL #2   Title  Lymphedema (LE) management/ self-care: Pt able to apply thigh length,  multi layered, gradient compression wraps with modified independence ( with extra time)  using proper techniques within 2 weeks to achieve optimal limb volume reductions in preparation for fitting compression garments/ devices bilaterally.    Baseline  dependent    Time  2    Period  Weeks    Status  Achieved      OT LONG TERM GOAL #3   Title  Lymphedema (LE) management/ self-care:  Pt to achieve at least 10% LLE limb volume reductions  in the full LLE and 5% limb volume reduction in the RLE during Intensive Phase CDT to limit LE progression, to reduce pain, to improve ADLs performance.    Baseline  Max A    Time  12    Period  Weeks    Status  Achieved      OT LONG TERM GOAL #4   Title  Lymphedema (LE) management/ self-care:  Pt to tolerate daily compression wraps, compression garments and/ or HOS devices in keeping w/ prescribed wear regime within 1 week of issue date of each to progress and retain clinical and functional gains and to limit LE progression.    Baseline  Max A    Time  12    Period  Weeks    Status  Partially Met      OT LONG TERM GOAL #5   Title  Lymphedema (LE) management/ self-care:  During Management Phase CDT Pt to sustain  current limb volumes within 5%, and all other clinical gains achieved during OT treatment WITH MAXIMUM CAREGIVER ASSISTANCE  to control leg and foot swelling, to  limit LE progression, to dec4rease infection risk and to limit further functional decline.    Baseline  dependent    Time  6    Period  Months    Status  On-going            Plan - 07/08/17 1618    Clinical Impression Statement  Pt continues to make progress towards all OT goalsfor LE care. LLE limb volume is decreased and tissue below the knee is palpably less congested. Pt has met all self care goals and is relatively compliant   betweemn visits. Custom compession garments have been fitted and should be delivered by end of this week. Well assess and fit them next week then decrease frequenc7y to 1 week and 1 month follow ups if all remains stable.    Occupational Profile and client history currently impacting functional performance  Pt tolerated all aspects of manual CDT today. Pt continues to build   increased tolerance and compliance with thigh length bandaging regime. ZPt agrees w/ plan to complete repeat LLE comparative limb volumetrics    next visit.    Occupational performance deficits (Please refer to evaluation for details):  ADL's;IADL's;Rest and Sleep;Work;Leisure;Social Participation;Other;Play body image    Rehab Potential  Good    OT Frequency  3x / week       Patient will benefit from skilled therapeutic intervention in order to improve the following deficits and impairments:     Visit Diagnosis: Lymphedema    Problem List Patient Active Problem List   Diagnosis Date Noted  . Cerumen impaction 01/12/2017  . Chemotherapy-induced peripheral neuropathy ([place]) 12/10/2016  . 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 SIRS (systemic inflammatory response syndrome) ([place]) 08/05/2016  . Endometrial cancer ([place])   . Pelvic adhesive disease   . Lymphedema 07/20/2016  . Chronic venous insufficiency 07/20/2016  . PAD  (peripheral artery disease) ([place]) 07/20/2016  .  Postmenopausal bleeding 07/14/2016  . Influenza 07/14/2016  . Class 1 obesity due to excess calories without serious comorbidity with body mass index (BMI) of 34.0 to 34.9 in adult 05/27/2016  . Chronic cough 12/19/2015  . Pre-syncope 12/19/2015  . Hx of colonic polyps   . Benign neoplasm of sigmoid colon   . Dysphagia, pharyngoesophageal phase 10/01/2014  . Prolapsed, uterovaginal, incomplete 06/24/2014  . Routine general medical examination at a health care facility 12/30/2012  . Obesity (BMI 30-39.9) 12/30/2012  . Hx of multiple pulmonary nodules 12/28/2012  . Plantar fasciitis of right foot 12/28/2012  . 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 Edema of both legs 09/12/2012  . Hip pain, bilateral 09/12/2012  . History of Hodgkin's lymphoma 09/12/2012  . Coronary artery disease   . Hyperlipidemia   . Chest pain on exertion 02/29/2012    Andrey Spearman, MS, OTR/L, [facility] 07/08/17 4:21 PM  [place] MAIN [facility] SERVICES [address] [place], [place], [address] Phone: [phone]   Fax:  [phone]  Name: LUCELLA POMMIER MRN: [id number] Date of Birth: Apr 16, 1951

## 2017-07-12 ENCOUNTER — Encounter: Payer: PPO | Admitting: Occupational Therapy

## 2017-07-13 ENCOUNTER — Ambulatory Visit: Payer: PPO | Admitting: Occupational Therapy

## 2017-07-13 DIAGNOSIS — I890 Lymphedema, not elsewhere classified: Secondary | ICD-10-CM | POA: Diagnosis not present

## 2017-07-14 DIAGNOSIS — M9903 Segmental and somatic dysfunction of lumbar region: Secondary | ICD-10-CM | POA: Diagnosis not present

## 2017-07-14 DIAGNOSIS — M5137 Other intervertebral disc degeneration, lumbosacral region: Secondary | ICD-10-CM | POA: Diagnosis not present

## 2017-07-14 NOTE — Therapy (Signed)
[place] MAIN [facility] SERVICES [address] [place], [place], [address] Phone: [phone]   Fax:  [phone]  Occupational Therapy Treatment  Patient Details  Name: Jaime Hall MRN: [id number] Date of Birth: 09-23-1950 No Data Recorded  Encounter Date: 07/13/2017  OT End of Session - 07/13/17 1209    Visit Number  21    Number of Visits  36    Date for OT Re-Evaluation  07/07/17    Authorization Type  visit 5 for 2019    OT Start Time  0208    OT Stop Time  0304    OT Time Calculation (min)  56 min    Activity Tolerance  Patient tolerated treatment well;No increased pain    Behavior During Therapy  WFL for tasks assessed/performed       Past Medical History:  Diagnosis Date  . Anxiety   . Cervicalgia   . Coronary artery disease    a. 02/2012 Stress echo: severe anterior wall ischemia;  b. 02/2012 Cath/PCI: LAD 95p (3.0 x 15 Xience EX DES), D1 90ost (PTCA - bifurcational dzs), EF 45% with anterior HK;  b. 02/2013 Ex MV: fixed anterior defect w/ minor reversibility, nl EF-->Med Rx.  . Endometrial cancer ([place])    a. 07/2016 s/p robotic hysterectomy, BSO w/ washings, sentinel node inj, mapping, bx, adhesiolysis.  . Essential hypertension, benign   . Fibrocystic breast disease   . GERD (gastroesophageal reflux disease)   . Gestational hypertension   . Heart murmur   . History of anemia   . History of blood transfusion   . Hodgkin's lymphoma ([place]) 2011   a. s/p radiation and chemo therapy  . Osteoarthritis   . Polycystic ovarian disease     Past Surgical History:  Procedure Laterality Date  . ABDOMINAL HYSTERECTOMY    . bladder sling    . CARDIAC CATHETERIZATION  02/2012   [facility] 1 stent place  . CERVICAL POLYPECTOMY    . CHOLECYSTECTOMY  1982  . COLONOSCOPY WITH PROPOFOL N/A 02/05/2015   Procedure: COLONOSCOPY WITH PROPOFOL;  Surgeon: Lucilla Lame, MD;  Location: [facility] ENDOSCOPY;  Service: Endoscopy;  Laterality: N/A;  . CORONARY  ANGIOPLASTY  02/2012   left/right s/p balloon  . CYSTOGRAM N/A 08/17/2016   Procedure: CYSTOGRAM;  Surgeon: Hollice Espy, MD;  Location: [facility] ORS;  Service: Urology;  Laterality: N/A;  . CYSTOSCOPY N/A 08/17/2016   Procedure: CYSTOSCOPY EXAM UNDER ANESTHESIA;  Surgeon: Hollice Espy, MD;  Location: [facility] ORS;  Service: Urology;  Laterality: N/A;  . CYSTOSCOPY W/ RETROGRADES Bilateral 08/17/2016   Procedure: CYSTOSCOPY WITH RETROGRADE PYELOGRAM;  Surgeon: Hollice Espy, MD;  Location: [facility] ORS;  Service: Urology;  Laterality: Bilateral;  . CYSTOSCOPY WITH STENT PLACEMENT Right 08/17/2016   Procedure: CYSTOSCOPY WITH STENT PLACEMENT;  Surgeon: Hollice Espy, MD;  Location: [facility] ORS;  Service: Urology;  Laterality: Right;  . heart stent'  2013  . kidney stent Right 2018  . LYMPH NODE BIOPSY  2011   diagnosis of hodgkins lymphoma  . PELVIC LYMPH NODE DISSECTION N/A 07/29/2016   Procedure: PELVIC/AORTIC LYMPH NODE SAMPLING;  Surgeon: [name], MD;  Location: [facility] ORS;  Service: Gynecology;  Laterality: N/A;  . PORTA CATH INSERTION N/A 09/22/2016   Procedure: Glori Luis Cath Insertion;  Surgeon: [name], MD;  Location: [place] [facility];  Service: Cardiovascular;  Laterality: N/A;  . PORTA CATH REMOVAL N/A 11/17/2016   Procedure: Glori Luis Cath Removal;  Surgeon: [name],  MD;  Location: [place] [facility];  Service: Cardiovascular;  Laterality: N/A;  . ROBOTIC ASSISTED TOTAL HYSTERECTOMY WITH BILATERAL SALPINGO OOPHERECTOMY N/A 07/29/2016   Procedure: ROBOTIC ASSISTED TOTAL HYSTERECTOMY WITH BILATERAL SALPINGO OOPHORECTOMY;  Surgeon: [name], MD;  Location: [facility] ORS;  Service: Gynecology;  Laterality: N/A;  . SENTINEL NODE BIOPSY N/A 07/29/2016   Procedure: SENTINEL NODE BIOPSY;  Surgeon: [name], MD;  Location: [facility] ORS;  Service: Gynecology;  Laterality: N/A;  . transobturator sling N/A 2009   [place]    There were no vitals filed for this  visit.  Subjective Assessment - 07/14/17 1148    Subjective   Pt presents for OT  visit 20/36 to address BLE lymphedema 2/2 lymphoma and endometrial cancer treatment. Pt reporting iongoing back pain with some improvement after chiropractor visit earlier in the week.  (Pended)     Pertinent History  CVI, PAD, Ultrasound negative for DVT and + for baker's cyst; chemo-induced periferal neuropathy, endometrial ca, pelvic adhesive disease, PAD, recent weight loss reported as ~ 25-30 pounds, hx Hodgkin's lymphoma ( s/p xrt and chemo), OA, abdominal hysterectomy, oophorectomy, pelvic LN disection(pelvic/ aortic node biopsy 0/3 LN), post op infection/ vaginal cuff abcess, severe pelvic adhesive disease, , s/p chemotherapy and whole pelvic radiation  (Pended)     Limitations  leg swelling, L>R) , BLE pain/ discomfort, decreased sensation / neuropathic pain in feet and hands, difficulty walking, decreased standing tolerance, decreased balance w/ increased falls risk, difficulty fitting LB clothing and preferred street shoes, mildly impaired functional mobility and transferes  (Pended)     Patient Stated Goals  decrease leg swelling and pain.   (Pended)     Pain Onset  Other (comment)  (Pended)  post chemo                           OT Education - 07/13/17 1209    Education provided  Yes    Education Details  Continued skilled Pt/caregiver education  And LE ADL training throughout visit for lymphedema self care/ home program, including compression wrapping, compression garment and device wear/care, lymphatic pumping ther ex, simple self-MLD, and skin care. Discussed progress towards goals.    Person(s) Educated  Patient    Methods  Explanation;Demonstration    Comprehension  Verbalized understanding;Returned demonstration          OT Long Term Goals - 07/08/17 1620      OT LONG TERM GOAL #1   Title  Pt independent w/ lymphedema precautions and prevention principals and strategies to  limit LE progression and infection risk.    Baseline  dependent    Time  2    Period  Weeks    Status  Achieved      OT LONG TERM GOAL #2   Title  Lymphedema (LE) management/ self-care: Pt able to apply thigh length, multi layered, gradient compression wraps with modified independence ( with extra time)  using proper techniques within 2 weeks to achieve optimal limb volume reductions in preparation for fitting compression garments/ devices bilaterally.    Baseline  dependent    Time  2    Period  Weeks    Status  Achieved      OT LONG TERM GOAL #3   Title  Lymphedema (LE) management/ self-care:  Pt to achieve at least 10% LLE limb volume reductions  in the full LLE and 5% limb volume reduction in the RLE during  Intensive Phase CDT to limit LE progression, to reduce pain, to improve ADLs performance.    Baseline  Max A    Time  12    Period  Weeks    Status  Achieved      OT LONG TERM GOAL #4   Title  Lymphedema (LE) management/ self-care:  Pt to tolerate daily compression wraps, compression garments and/ or HOS devices in keeping w/ prescribed wear regime within 1 week of issue date of each to progress and retain clinical and functional gains and to limit LE progression.    Baseline  Max A    Time  12    Period  Weeks    Status  Partially Met      OT LONG TERM GOAL #5   Title  Lymphedema (LE) management/ self-care:  During Management Phase CDT Pt to sustain current limb volumes within 5%, and all other clinical gains achieved during OT treatment WITH MAXIMUM CAREGIVER ASSISTANCE  to control leg and foot swelling, to  limit LE progression, to dec4rease infection risk and to limit further functional decline.    Baseline  dependent    Time  6    Period  Months    Status  On-going            Plan - 07/13/17 1210    Clinical Impression Statement  Pt wears new custom compression garment to session. Garment slightly too short and lose at top silicone band. Completed  new garment  measurenets and faxed list of remake needs to DME vendor. Pt requesting remake beige instead of black. 2.) modified circumferential measurements to reflect new volume reductions. Moved F landmark up and tightened measurement to improve fit. Requested add inside 5 cm silicone band ( 3/4) to improve garment top edge placement. Pt able to don garment again after session using friction gloves- modified assistance. Cont as per POC    Occupational Profile and client history currently impacting functional performance  Pt tolerated all aspects of manual CDT today. Pt continues to build   increased tolerance and compliance with thigh length bandaging regime. ZPt agrees w/ plan to complete repeat LLE comparative limb volumetrics    next visit.    Occupational performance deficits (Please refer to evaluation for details):  ADL's;IADL's;Rest and Sleep;Work;Leisure;Social Participation;Other;Play body image    Rehab Potential  Good    OT Frequency  3x / week       Patient will benefit from skilled therapeutic intervention in order to improve the following deficits and impairments:     Visit Diagnosis: Lymphedema    Problem List Patient Active Problem List   Diagnosis Date Noted  . Cerumen impaction 01/12/2017  . Chemotherapy-induced peripheral neuropathy ([place]) 12/10/2016  . 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 SIRS (systemic inflammatory response syndrome) ([place]) 08/05/2016  . Endometrial cancer ([place])   . Pelvic adhesive disease   . Lymphedema 07/20/2016  . Chronic venous insufficiency 07/20/2016  . PAD (peripheral artery disease) ([place]) 07/20/2016  . Postmenopausal bleeding 07/14/2016  . Influenza 07/14/2016  . Class 1 obesity due to excess calories without serious comorbidity with body mass index (BMI) of 34.0 to 34.9 in adult 05/27/2016  . Chronic cough 12/19/2015  . Pre-syncope 12/19/2015  . Hx of colonic polyps   . Benign neoplasm of sigmoid colon   . Dysphagia, pharyngoesophageal phase  10/01/2014  . Prolapsed, uterovaginal, incomplete 06/24/2014  . Routine general medical examination at a health care facility 12/30/2012  . Obesity (BMI 30-39.9) 12/30/2012  .  Hx of multiple pulmonary nodules 12/28/2012  . Plantar fasciitis of right foot 12/28/2012  . 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 Edema of both legs 09/12/2012  . Hip pain, bilateral 09/12/2012  . History of Hodgkin's lymphoma 09/12/2012  . Coronary artery disease   . Hyperlipidemia   . Chest pain on exertion 02/29/2012    Andrey Spearman, MS, OTR/L, [facility] 07/14/17 12:15 PM  [place] MAIN [facility] SERVICES [address] [place], [place], [address] Phone: [phone]   Fax:  [phone]  Name: Jaime Hall MRN: [id number] Date of Birth: 1950-11-08

## 2017-07-15 ENCOUNTER — Ambulatory Visit: Payer: PPO | Admitting: Occupational Therapy

## 2017-07-15 DIAGNOSIS — I890 Lymphedema, not elsewhere classified: Secondary | ICD-10-CM | POA: Diagnosis not present

## 2017-07-15 NOTE — Therapy (Signed)
[place] MAIN [facility] SERVICES [address] [place], [place], [address] Phone: [phone]   Fax:  [phone]  Occupational Therapy Treatment  Patient Details  Name: Jaime Hall MRN: [id number] Date of Birth: January 18, 1951 No Data Recorded  Encounter Date: 07/15/2017  OT End of Session - 07/15/17 1649    Visit Number  22    Number of Visits  36    Date for OT Re-Evaluation  07/07/17    Authorization Type  visit 5 for 2019    OT Start Time  0200    OT Stop Time  0300    OT Time Calculation (min)  60 min    Activity Tolerance  Patient tolerated treatment well;No increased pain    Behavior During Therapy  WFL for tasks assessed/performed       Past Medical History:  Diagnosis Date  . Anxiety   . Cervicalgia   . Coronary artery disease    a. 02/2012 Stress echo: severe anterior wall ischemia;  b. 02/2012 Cath/PCI: LAD 95p (3.0 x 15 Xience EX DES), D1 90ost (PTCA - bifurcational dzs), EF 45% with anterior HK;  b. 02/2013 Ex MV: fixed anterior defect w/ minor reversibility, nl EF-->Med Rx.  . Endometrial cancer ([place])    a. 07/2016 s/p robotic hysterectomy, BSO w/ washings, sentinel node inj, mapping, bx, adhesiolysis.  . Essential hypertension, benign   . Fibrocystic breast disease   . GERD (gastroesophageal reflux disease)   . Gestational hypertension   . Heart murmur   . History of anemia   . History of blood transfusion   . Hodgkin's lymphoma ([place]) 2011   a. s/p radiation and chemo therapy  . Osteoarthritis   . Polycystic ovarian disease     Past Surgical History:  Procedure Laterality Date  . ABDOMINAL HYSTERECTOMY    . bladder sling    . CARDIAC CATHETERIZATION  02/2012   [facility] 1 stent place  . CERVICAL POLYPECTOMY    . CHOLECYSTECTOMY  1982  . COLONOSCOPY WITH PROPOFOL N/A 02/05/2015   Procedure: COLONOSCOPY WITH PROPOFOL;  Surgeon: Lucilla Lame, MD;  Location: [facility] ENDOSCOPY;  Service: Endoscopy;  Laterality: N/A;  . CORONARY  ANGIOPLASTY  02/2012   left/right s/p balloon  . CYSTOGRAM N/A 08/17/2016   Procedure: CYSTOGRAM;  Surgeon: Hollice Espy, MD;  Location: [facility] ORS;  Service: Urology;  Laterality: N/A;  . CYSTOSCOPY N/A 08/17/2016   Procedure: CYSTOSCOPY EXAM UNDER ANESTHESIA;  Surgeon: Hollice Espy, MD;  Location: [facility] ORS;  Service: Urology;  Laterality: N/A;  . CYSTOSCOPY W/ RETROGRADES Bilateral 08/17/2016   Procedure: CYSTOSCOPY WITH RETROGRADE PYELOGRAM;  Surgeon: Hollice Espy, MD;  Location: [facility] ORS;  Service: Urology;  Laterality: Bilateral;  . CYSTOSCOPY WITH STENT PLACEMENT Right 08/17/2016   Procedure: CYSTOSCOPY WITH STENT PLACEMENT;  Surgeon: Hollice Espy, MD;  Location: [facility] ORS;  Service: Urology;  Laterality: Right;  . heart stent'  2013  . kidney stent Right 2018  . LYMPH NODE BIOPSY  2011   diagnosis of hodgkins lymphoma  . PELVIC LYMPH NODE DISSECTION N/A 07/29/2016   Procedure: PELVIC/AORTIC LYMPH NODE SAMPLING;  Surgeon: [name], MD;  Location: [facility] ORS;  Service: Gynecology;  Laterality: N/A;  . PORTA CATH INSERTION N/A 09/22/2016   Procedure: Glori Luis Cath Insertion;  Surgeon: [name], MD;  Location: [place] CV LAB;  Service: Cardiovascular;  Laterality: N/A;  . PORTA CATH REMOVAL N/A 11/17/2016   Procedure: Glori Luis Cath Removal;  Surgeon: [name],  MD;  Location: [place] CV LAB;  Service: Cardiovascular;  Laterality: N/A;  . ROBOTIC ASSISTED TOTAL HYSTERECTOMY WITH BILATERAL SALPINGO OOPHERECTOMY N/A 07/29/2016   Procedure: ROBOTIC ASSISTED TOTAL HYSTERECTOMY WITH BILATERAL SALPINGO OOPHORECTOMY;  Surgeon: [name], MD;  Location: [facility] ORS;  Service: Gynecology;  Laterality: N/A;  . SENTINEL NODE BIOPSY N/A 07/29/2016   Procedure: SENTINEL NODE BIOPSY;  Surgeon: [name], MD;  Location: [facility] ORS;  Service: Gynecology;  Laterality: N/A;  . transobturator sling N/A 2009   [place]    There were no vitals filed for this  visit.  Subjective Assessment - 07/15/17 1648    Subjective   Pt presents for OT  visit 22/36 to address BLE lymphedema 2/2 lymphoma and endometrial cancer treatment. Pt brings compression garment to clinic. Pt states foot portion          feels constrictive on her foot after wearing all day at work.     Pertinent History  CVI, PAD, Ultrasound negative for DVT and + for baker's cyst; chemo-induced periferal neuropathy, endometrial ca, pelvic adhesive disease, PAD, recent weight loss reported as ~ 25-30 pounds, hx Hodgkin's lymphoma ( s/p xrt and chemo), OA, abdominal hysterectomy, oophorectomy, pelvic LN disection(pelvic/ aortic node biopsy 0/3 LN), post op infection/ vaginal cuff abcess, severe pelvic adhesive disease, , s/p chemotherapy and whole pelvic radiation    Limitations  leg swelling, L>R) , BLE pain/ discomfort, decreased sensation / neuropathic pain in feet and hands, difficulty walking, decreased standing tolerance, decreased balance w/ increased falls risk, difficulty fitting LB clothing and preferred street shoes, mildly impaired functional mobility and transferes    Patient Stated Goals  decrease leg swelling and pain.     Currently in Pain?  No/denies    Pain Onset  Other (comment) post chemo                           OT Education - 07/15/17 1649    Education provided  Yes    Education Details  Continued skilled Pt/caregiver education  And LE ADL training throughout visit for lymphedema self care/ home program, including compression wrapping, compression garment and device wear/care, lymphatic pumping ther ex, simple self-MLD, and skin care. Discussed progress towards goals.    Person(s) Educated  Patient    Methods  Explanation;Demonstration    Comprehension  Verbalized understanding;Returned demonstration          OT Long Term Goals - 07/08/17 1620      OT LONG TERM GOAL #1   Title  Pt independent w/ lymphedema precautions and prevention principals  and strategies to limit LE progression and infection risk.    Baseline  dependent    Time  2    Period  Weeks    Status  Achieved      OT LONG TERM GOAL #2   Title  Lymphedema (LE) management/ self-care: Pt able to apply thigh length, multi layered, gradient compression wraps with modified independence ( with extra time)  using proper techniques within 2 weeks to achieve optimal limb volume reductions in preparation for fitting compression garments/ devices bilaterally.    Baseline  dependent    Time  2    Period  Weeks    Status  Achieved      OT LONG TERM GOAL #3   Title  Lymphedema (LE) management/ self-care:  Pt to achieve at least 10% LLE limb volume reductions  in the full LLE  and 5% limb volume reduction in the RLE during Intensive Phase CDT to limit LE progression, to reduce pain, to improve ADLs performance.    Baseline  Max A    Time  12    Period  Weeks    Status  Achieved      OT LONG TERM GOAL #4   Title  Lymphedema (LE) management/ self-care:  Pt to tolerate daily compression wraps, compression garments and/ or HOS devices in keeping w/ prescribed wear regime within 1 week of issue date of each to progress and retain clinical and functional gains and to limit LE progression.    Baseline  Max A    Time  12    Period  Weeks    Status  Partially Met      OT LONG TERM GOAL #5   Title  Lymphedema (LE) management/ self-care:  During Management Phase CDT Pt to sustain current limb volumes within 5%, and all other clinical gains achieved during OT treatment WITH MAXIMUM CAREGIVER ASSISTANCE  to control leg and foot swelling, to  limit LE progression, to dec4rease infection risk and to limit further functional decline.    Baseline  dependent    Time  6    Period  Months    Status  On-going            Plan - 07/15/17 1650    Clinical Impression Statement  Pt instructed to stretch foot portion of compression garment on solid form, like a bottle of cup, while drying to  stretch open foot edge a a bit in an effort to improve comfort. She'll try this and we'l;l re-assess next week. DME vendor advises that remake has been ordered. Pt tolerated MLD today without difficulty. She declined compression after session and will don  again after she washed stocking.  Today is Pt's last scheduled OT visit. She will continue wearing compression garment using loaned garment adhesive until remake arrives. She will call to schedule an assessment and we'll schedule follow up for 1-3 months if garment fit is correct.    Occupational Profile and client history currently impacting functional performance  Pt tolerated all aspects of manual CDT today. Pt continues to build   increased tolerance and compliance with thigh length bandaging regime. ZPt agrees w/ plan to complete repeat LLE comparative limb volumetrics    next visit.    Occupational performance deficits (Please refer to evaluation for details):  ADL's;IADL's;Rest and Sleep;Work;Leisure;Social Participation;Other;Play body image    Rehab Potential  Good    OT Frequency  3x / week       Patient will benefit from skilled therapeutic intervention in order to improve the following deficits and impairments:     Visit Diagnosis: Lymphedema    Problem List Patient Active Problem List   Diagnosis Date Noted  . Cerumen impaction 01/12/2017  . Chemotherapy-induced peripheral neuropathy ([place]) 12/10/2016  . 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 SIRS (systemic inflammatory response syndrome) ([place]) 08/05/2016  . Endometrial cancer ([place])   . Pelvic adhesive disease   . Lymphedema 07/20/2016  . Chronic venous insufficiency 07/20/2016  . PAD (peripheral artery disease) ([place]) 07/20/2016  . Postmenopausal bleeding 07/14/2016  . Influenza 07/14/2016  . Class 1 obesity due to excess calories without serious comorbidity with body mass index (BMI) of 34.0 to 34.9 in adult 05/27/2016  . Chronic cough 12/19/2015  . Pre-syncope 12/19/2015   . Hx of colonic polyps   . Benign neoplasm of sigmoid [place]   .  Dysphagia, pharyngoesophageal phase 10/01/2014  . Prolapsed, uterovaginal, incomplete 06/24/2014  . Routine general medical examination at a health care facility 12/30/2012  . Obesity (BMI 30-39.9) 12/30/2012  . Hx of multiple pulmonary nodules 12/28/2012  . Plantar fasciitis of right foot 12/28/2012  . 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 Edema of both legs 09/12/2012  . Hip pain, bilateral 09/12/2012  . History of Hodgkin's lymphoma 09/12/2012  . Coronary artery disease   . Hyperlipidemia   . Chest pain on exertion 02/29/2012    Andrey Spearman, MS, OTR/L, [facility] 07/15/17 4:55 PM  [place] MAIN [facility] SERVICES [address] [place], [place], [address] Phone: [phone]   Fax:  [phone]  Name: BRANDACE CARGLE MRN: [id number] Date of Birth: 09-18-1950

## 2017-07-16 ENCOUNTER — Ambulatory Visit (INDEPENDENT_AMBULATORY_CARE_PROVIDER_SITE_OTHER): Payer: PPO | Admitting: Internal Medicine

## 2017-07-16 ENCOUNTER — Encounter: Payer: Self-pay | Admitting: Internal Medicine

## 2017-07-16 VITALS — BP 130/88 | HR 77 | Temp 97.7°F | Resp 16 | Ht 61.0 in | Wt 173.0 lb

## 2017-07-16 DIAGNOSIS — Z87898 Personal history of other specified conditions: Secondary | ICD-10-CM

## 2017-07-16 DIAGNOSIS — M545 Low back pain, unspecified: Secondary | ICD-10-CM

## 2017-07-16 DIAGNOSIS — D649 Anemia, unspecified: Secondary | ICD-10-CM | POA: Diagnosis not present

## 2017-07-16 DIAGNOSIS — G8929 Other chronic pain: Secondary | ICD-10-CM | POA: Diagnosis not present

## 2017-07-16 DIAGNOSIS — J479 Bronchiectasis, uncomplicated: Secondary | ICD-10-CM

## 2017-07-16 DIAGNOSIS — E6609 Other obesity due to excess calories: Secondary | ICD-10-CM | POA: Diagnosis not present

## 2017-07-16 DIAGNOSIS — Z6834 Body mass index (BMI) 34.0-34.9, adult: Secondary | ICD-10-CM

## 2017-07-16 DIAGNOSIS — G620 Drug-induced polyneuropathy: Secondary | ICD-10-CM | POA: Diagnosis not present

## 2017-07-16 DIAGNOSIS — E785 Hyperlipidemia, unspecified: Secondary | ICD-10-CM | POA: Diagnosis not present

## 2017-07-16 DIAGNOSIS — E7800 Pure hypercholesterolemia, unspecified: Secondary | ICD-10-CM | POA: Diagnosis not present

## 2017-07-16 DIAGNOSIS — T451X5A Adverse effect of antineoplastic and immunosuppressive drugs, initial encounter: Secondary | ICD-10-CM | POA: Diagnosis not present

## 2017-07-16 LAB — LIPID PANEL
Cholesterol: 109 mg/dL (ref 0–200)
HDL: 45.1 mg/dL (ref >39.00)
LDL Cholesterol: 38 mg/dL (ref 0–99)
NonHDL: 63.48
Total CHOL/HDL Ratio: 2
Triglycerides: 128 mg/dL (ref 0.0–149.0)
VLDL: 25.6 mg/dL (ref 0.0–40.0)

## 2017-07-16 MED ORDER — CELECOXIB 200 MG PO CAPS: 200.0000 mg | ORAL_CAPSULE | Freq: Two times a day (BID) | ORAL | 1 refills | Status: DC

## 2017-07-16 MED ORDER — DIAZEPAM 5 MG PO TABS: 5.0000 mg | ORAL_TABLET | Freq: Two times a day (BID) | ORAL | 0 refills | Status: DC | PRN

## 2017-07-16 MED ORDER — CAPSAICIN 0.075 % EX CREA: 1.0000 "application " | TOPICAL_CREAM | Freq: Four times a day (QID) | CUTANEOUS | 0 refills | Status: DC

## 2017-07-16 NOTE — Progress Notes (Signed)
Subjective:  Patient ID: Jaime Hall, female    DOB: Apr 05, 1951  Age: 67 y.o. MRN: [id number]  CC: The primary encounter diagnosis was Hyperlipidemia LDL goal <100. Diagnoses of Class 1 obesity due to excess calories without serious comorbidity with body mass index (BMI) of 34.0 to 34.9 in adult, Neuropathy due to chemotherapeutic drug ([place]), Pure hypercholesterolemia, Hx of multiple pulmonary nodules, Bronchiectasis without complication ([place]), Normocytic anemia, and Chronic bilateral low back pain without sciatica were also pertinent to this visit.  HPI Jaime Hall presents for follow up on hyperlipidemia, GAD   Receiving PT for lymphemia of upper legs,  Garments  being made   endometrial CA found in  Feb 0272, complicated by abscess .  Sp s/p TAH BSO  Adhesiolysis and chemo 3 cycles of carbo/taxol complicated by neuropathy,  And XRT .  had follow up in Dec and every 6 months   Neuropathy: using acupuncture .  Hypotension and syncope with cymbalta .  Tried gabapentin,    Normocytic anemia:  Colonoscopy 2016 polyps,  5 yr follow up   Back pain   Refuses flu vaccine   Outpatient Medications Prior to Visit  Medication Sig Dispense Refill  . acetaminophen (TYLENOL) 500 MG tablet Take 1,000 mg by mouth every 6 (six) hours as needed for mild pain.     Marland Kitchen aspirin EC 81 MG tablet Take 81 mg by mouth at bedtime.    Marland Kitchen atorvastatin (LIPITOR) 10 MG tablet TAKE ONE TABLET BY MOUTH ONCE DAILY (Patient taking differently: Take 10 mg by mouth at bedtime. ) 90 tablet 3  . atorvastatin (LIPITOR) 10 MG tablet TAKE ONE TABLET BY MOUTH ONCE DAILY 90 tablet 3  . Cholecalciferol (VITAMIN D3) 2000 units TABS Take 2,000 Units by mouth 3 (three) times a week.    . esomeprazole (NEXIUM) 20 MG capsule Take 20 mg by mouth daily before breakfast.     . ibuprofen (ADVIL,MOTRIN) 200 MG tablet Take 200 mg by mouth every 6 (six) hours as needed for mild pain.     . metoprolol tartrate (LOPRESSOR) 25 MG  tablet TAKE ONE TABLET BY MOUTH TWICE DAILY 180 tablet 3  . naproxen sodium (ANAPROX) 220 MG tablet Take 440 mg by mouth 2 (two) times daily as needed (for pain/headache.).    Marland Kitchen nitroGLYCERIN (NITROSTAT) 0.4 MG SL tablet Place 1 tablet (0.4 mg total) under the tongue every 5 (five) minutes as needed for chest pain. 25 tablet 3  . Turmeric 500 MG CAPS Take 500 mg by mouth 2 (two) times daily.    . diazepam (VALIUM) 5 MG tablet Take 1 tablet (5 mg total) by mouth every 12 (twelve) hours as needed for anxiety. 30 tablet 0   No facility-administered medications prior to visit.     Review of Systems;  Patient denies headache, fevers, malaise, unintentional weight loss, skin rash, eye pain, sinus congestion and sinus pain, sore throat, dysphagia,  hemoptysis , cough, dyspnea, wheezing, chest pain, palpitations, orthopnea, edema, abdominal pain, nausea, melena, diarrhea, constipation, flank pain, dysuria, hematuria, urinary  Frequency, nocturia, numbness, tingling, seizures,  Focal weakness, Loss of consciousness,  Tremor, insomnia, depression, anxiety, and suicidal ideation.      Objective:  BP 130/88   Pulse 77   Temp 97.7 F (36.5 C) (Oral)   Resp 16   Ht 5\' 1"  (1.549 m)   Wt 173 lb (78.5 kg)   SpO2 98%   BMI 32.69 kg/m   BP Readings from Last  3 Encounters:  07/16/17 130/88  06/10/17 134/83  03/24/17 110/71    Wt Readings from Last 3 Encounters:  07/16/17 173 lb (78.5 kg)  06/10/17 175 lb (79.4 kg)  03/24/17 172 lb 6.4 oz (78.2 kg)    General appearance: alert, cooperative and appears stated age Ears: normal TM's and external ear canals both ears Throat: lips, mucosa, and tongue normal; teeth and gums normal Neck: no adenopathy, no carotid bruit, supple, symmetrical, trachea midline and thyroid not enlarged, symmetric, no tenderness/mass/nodules Back: symmetric, no curvature. ROM normal. No CVA tenderness. Lungs: clear to auscultation bilaterally Heart: regular rate and  rhythm, S1, S2 normal, no murmur, click, rub or gallop Abdomen: soft, non-tender; bowel sounds normal; no masses,  no organomegaly Pulses: 2+ and symmetric Skin: Skin color, texture, turgor normal. No rashes or lesions Lymph nodes: Cervical, supraclavicular, and axillary nodes normal.  Lab Results  Component Value Date   HGBA1C 5.8 05/26/2016    Lab Results  Component Value Date   CREATININE 0.86 06/10/2017   CREATININE 1.23 (H) 03/11/2017   CREATININE 1.02 (H) 12/10/2016    Lab Results  Component Value Date   WBC 6.4 06/10/2017   HGB 11.7 (L) 06/10/2017   HCT 34.7 (L) 06/10/2017   PLT 243 06/10/2017   GLUCOSE 110 (H) 06/10/2017   CHOL 109 07/16/2017   TRIG 128.0 07/16/2017   HDL 45.10 07/16/2017   LDLCALC 38 07/16/2017   ALT 15 06/10/2017   AST 20 06/10/2017   NA 140 06/10/2017   K 3.5 06/10/2017   CL 105 06/10/2017   CREATININE 0.86 06/10/2017   BUN 15 06/10/2017   CO2 27 06/10/2017   TSH 3.92 05/26/2016   INR 1.02 08/05/2016   HGBA1C 5.8 05/26/2016   MICROALBUR 0.8 09/23/2012    No results found.  Assessment & Plan:   Problem List Items Addressed This Visit    Back pain    Non radiating,   Not skeletal .  Trial of celebrex and MR.  If persistent wll need skeletal survey given history of lymphoma and endometrial cancer       Relevant Medications   celecoxib (CELEBREX) 200 MG capsule   Bronchiectasis ([place])    Found during workup for chronic cough. Attributed to radiation for history of lymphoma in 2011.   Currently asymptomatic      Class 1 obesity due to excess calories without serious comorbidity with body mass index (BMI) of 34.0 to 34.9 in adult    I have addressed  BMI and recommended wt loss of 10% of body weight over the next 6 months using a low fat, low starch, high protein  fruit/vegetable based Mediterranean diet and 30 minutes of aerobic exercise a minimum of 5 days per week.        Hx of multiple pulmonary nodules    Followed serially for  3 years with no change.  Bronchiectasis and scarring from prior radiation       Hyperlipidemia    Managed with  High potency statin ,  Low dose.   LDL and triglycerides are at goal on current medications. She  has no side effects and liver enzymes are normal. No changes today  Lab Results  Component Value Date   CHOL 109 07/16/2017   HDL 45.10 07/16/2017   LDLCALC 38 07/16/2017   TRIG 128.0 07/16/2017   CHOLHDL 2 07/16/2017   Lab Results  Component Value Date   ALT 15 06/10/2017   AST 20 06/10/2017  ALKPHOS 70 06/10/2017   BILITOT 0.8 06/10/2017               Neuropathy due to chemotherapeutic drug [facility])    Managed with acupuncture, after failing treatment with gabapentin and cymbalta due to side effects .  Discussed naturla remedies including capsaicin .       Relevant Medications   diazepam (VALIUM) 5 MG tablet   Normocytic anemia    Mild, persistent since May, but stable.   Last colonoscopy 2016, needs iron and B12 studies done.       Relevant Orders   Iron, TIBC and Ferritin Panel   Vitamin B12   Folate RBC    Other Visit Diagnoses    Hyperlipidemia LDL goal <100    -  Primary   Relevant Orders   Lipid panel (Completed)     A total of 25 minutes of face to face time was spent with patient more than half of which was spent in counselling about the above mentioned conditions  and coordination of care    I am having Jaime Hall start on capsicum and celecoxib. I am also having her maintain her nitroGLYCERIN, atorvastatin, esomeprazole, aspirin EC, Turmeric, Vitamin D3, naproxen sodium, acetaminophen, ibuprofen, metoprolol tartrate, atorvastatin, and diazepam.  Meds ordered this encounter  Medications  . capsicum (ZOSTRIX) 0.075 % topical cream    Sig: Apply 1 application topically 4 (four) times daily. To feet    Dispense:  28.3 g    Refill:  0  . celecoxib (CELEBREX) 200 MG capsule    Sig: Take 1 capsule (200 mg total) by mouth 2 (two) times  daily.    Dispense:  60 capsule    Refill:  1  . diazepam (VALIUM) 5 MG tablet    Sig: Take 1 tablet (5 mg total) by mouth every 12 (twelve) hours as needed for anxiety.    Dispense:  30 tablet    Refill:  0    Medications Discontinued During This Encounter  Medication Reason  . diazepam (VALIUM) 5 MG tablet Reorder    Follow-up: Return in about 1 year (around 07/16/2018).   [name], MD

## 2017-07-16 NOTE — Patient Instructions (Signed)
Good to see you!  Trial of celebrex for your back pain   Twice daily for one week,  Then once daily for another week if needed  Use the valium as a msucle relaxer if you need it   Capsaicin cream is helpful for some patients with  neuropathy .  Spread it on feet 4 times daily

## 2017-07-18 ENCOUNTER — Encounter: Payer: Self-pay | Admitting: Internal Medicine

## 2017-07-18 DIAGNOSIS — D649 Anemia, unspecified: Secondary | ICD-10-CM | POA: Insufficient documentation

## 2017-07-18 DIAGNOSIS — M549 Dorsalgia, unspecified: Secondary | ICD-10-CM | POA: Insufficient documentation

## 2017-07-18 NOTE — Assessment & Plan Note (Addendum)
Non radiating,   Not skeletal .  Trial of celebrex and MR.  If persistent wll need skeletal survey given history of lymphoma and endometrial cancer

## 2017-07-18 NOTE — Assessment & Plan Note (Signed)
Found during workup for chronic cough. Attributed to radiation for history of lymphoma in 2011.   Currently asymptomatic

## 2017-07-18 NOTE — Assessment & Plan Note (Signed)
Managed with  High potency statin ,  Low dose.   LDL and triglycerides are at goal on current medications. She  has no side effects and liver enzymes are normal. No changes today  Lab Results  Component Value Date   CHOL 109 07/16/2017   HDL 45.10 07/16/2017   LDLCALC 38 07/16/2017   TRIG 128.0 07/16/2017   CHOLHDL 2 07/16/2017   Lab Results  Component Value Date   ALT 15 06/10/2017   AST 20 06/10/2017   ALKPHOS 70 06/10/2017   BILITOT 0.8 06/10/2017

## 2017-07-18 NOTE — Assessment & Plan Note (Signed)
Followed serially for 3 years with no change.  Bronchiectasis and scarring from prior radiation

## 2017-07-18 NOTE — Assessment & Plan Note (Addendum)
Mild, persistent since May, but stable.   Last colonoscopy 2016, needs iron and B12 studies done.

## 2017-07-18 NOTE — Assessment & Plan Note (Addendum)
Managed with acupuncture, after failing treatment with gabapentin and cymbalta due to side effects .  Discussed naturla remedies including capsaicin .

## 2017-07-18 NOTE — Assessment & Plan Note (Signed)
I have addressed  BMI and recommended wt loss of 10% of body weight over the next 6 months using a low fat, low starch, high protein  fruit/vegetable based Mediterranean diet and 30 minutes of aerobic exercise a minimum of 5 days per week.   

## 2017-07-19 DIAGNOSIS — M9903 Segmental and somatic dysfunction of lumbar region: Secondary | ICD-10-CM | POA: Diagnosis not present

## 2017-07-19 DIAGNOSIS — M5137 Other intervertebral disc degeneration, lumbosacral region: Secondary | ICD-10-CM | POA: Diagnosis not present

## 2017-07-22 ENCOUNTER — Other Ambulatory Visit (INDEPENDENT_AMBULATORY_CARE_PROVIDER_SITE_OTHER): Payer: PPO

## 2017-07-22 DIAGNOSIS — D649 Anemia, unspecified: Secondary | ICD-10-CM | POA: Diagnosis not present

## 2017-07-22 LAB — VITAMIN B12: Vitamin B-12: 220 pg/mL (ref 211–911)

## 2017-07-23 LAB — IRON,TIBC AND FERRITIN PANEL
%SAT: 13 % (calc) (ref 11–50)
Ferritin: 60 ng/mL (ref 20–288)
Iron: 44 ug/dL — ABNORMAL LOW (ref 45–160)
TIBC: 331 mcg/dL (calc) (ref 250–450)

## 2017-07-23 LAB — FOLATE RBC: RBC Folate: 792 ng/mL RBC (ref >280)

## 2017-07-24 ENCOUNTER — Encounter: Payer: Self-pay | Admitting: Internal Medicine

## 2017-07-24 ENCOUNTER — Other Ambulatory Visit: Payer: Self-pay | Admitting: Internal Medicine

## 2017-07-24 DIAGNOSIS — D519 Vitamin B12 deficiency anemia, unspecified: Secondary | ICD-10-CM | POA: Insufficient documentation

## 2017-07-27 ENCOUNTER — Ambulatory Visit (INDEPENDENT_AMBULATORY_CARE_PROVIDER_SITE_OTHER): Payer: PPO | Admitting: *Deleted

## 2017-07-27 DIAGNOSIS — E538 Deficiency of other specified B group vitamins: Secondary | ICD-10-CM

## 2017-07-27 MED ORDER — CYANOCOBALAMIN 1000 MCG/ML IJ SOLN
1000.0000 ug | Freq: Once | INTRAMUSCULAR | Status: AC
  Administered 2017-07-27: 1000 ug via INTRAMUSCULAR

## 2017-07-27 NOTE — Progress Notes (Signed)
Patient presented for B 12 injection to left deltoid, patient voiced no concerns nor showed any signs of distress during injection  See last lab note for order.

## 2017-08-01 NOTE — Progress Notes (Signed)
  I have reviewed the above information and agree with above.   Ersa Delaney, MD 

## 2017-08-03 ENCOUNTER — Other Ambulatory Visit (INDEPENDENT_AMBULATORY_CARE_PROVIDER_SITE_OTHER): Payer: PPO

## 2017-08-03 ENCOUNTER — Ambulatory Visit (INDEPENDENT_AMBULATORY_CARE_PROVIDER_SITE_OTHER): Payer: PPO

## 2017-08-03 DIAGNOSIS — D519 Vitamin B12 deficiency anemia, unspecified: Secondary | ICD-10-CM | POA: Diagnosis not present

## 2017-08-03 MED ORDER — CYANOCOBALAMIN 1000 MCG/ML IJ SOLN
1000.0000 ug | Freq: Once | INTRAMUSCULAR | Status: AC
  Administered 2017-08-03: 1000 ug via INTRAMUSCULAR

## 2017-08-03 NOTE — Progress Notes (Signed)
Patient comes in for B 12 injection.  Injected right deltoid.  Patient tolerated injection well.   

## 2017-08-04 NOTE — Progress Notes (Signed)
  I have reviewed the above information and agree with above.   Garnette Greb, MD 

## 2017-08-05 ENCOUNTER — Ambulatory Visit: Payer: PPO | Attending: Oncology | Admitting: Occupational Therapy

## 2017-08-05 DIAGNOSIS — I890 Lymphedema, not elsewhere classified: Secondary | ICD-10-CM | POA: Diagnosis not present

## 2017-08-05 LAB — INTRINSIC FACTOR ANTIBODIES: Intrinsic Factor: POSITIVE — AB

## 2017-08-05 NOTE — Therapy (Signed)
[place] MAIN [facility] SERVICES [address] [place], [place], [address] Phone: [phone]   Fax:  [phone]  Occupational Therapy Treatment Note and Discharge Summary: Lymphedema Care  Patient Details  Name: Jaime Hall MRN: [id number] Date of Birth: 1950/11/22 No Data Recorded  Encounter Date: 08/05/2017  OT End of Session - 08/05/17 1626    Visit Number  23    Number of Visits  36    Date for OT Re-Evaluation  07/07/17    Authorization Type  visit 5 for 2019    OT Start Time  0310    OT Stop Time  0340    OT Time Calculation (min)  30 min    Activity Tolerance  Patient tolerated treatment well;No increased pain    Behavior During Therapy  WFL for tasks assessed/performed       Past Medical History:  Diagnosis Date  . Anxiety   . Cervicalgia   . Coronary artery disease    a. 02/2012 Stress echo: severe anterior wall ischemia;  b. 02/2012 Cath/PCI: LAD 95p (3.0 x 15 Xience EX DES), D1 90ost (PTCA - bifurcational dzs), EF 45% with anterior HK;  b. 02/2013 Ex MV: fixed anterior defect w/ minor reversibility, nl EF-->Med Rx.  . Endometrial cancer ([place])    a. 07/2016 s/p robotic hysterectomy, BSO w/ washings, sentinel node inj, mapping, bx, adhesiolysis.  . Essential hypertension, benign   . Fibrocystic breast disease   . GERD (gastroesophageal reflux disease)   . Gestational hypertension   . Heart murmur   . History of anemia   . History of blood transfusion   . Hodgkin's lymphoma ([place]) 2011   a. s/p radiation and chemo therapy  . Osteoarthritis   . Polycystic ovarian disease     Past Surgical History:  Procedure Laterality Date  . ABDOMINAL HYSTERECTOMY    . bladder sling    . CARDIAC CATHETERIZATION  02/2012   [facility] 1 stent place  . CERVICAL POLYPECTOMY    . CHOLECYSTECTOMY  1982  . COLONOSCOPY WITH PROPOFOL N/A 02/05/2015   Procedure: COLONOSCOPY WITH PROPOFOL;  Surgeon: Lucilla Lame, MD;  Location: [facility] ENDOSCOPY;   Service: Endoscopy;  Laterality: N/A;  . CORONARY ANGIOPLASTY  02/2012   left/right s/p balloon  . CYSTOGRAM N/A 08/17/2016   Procedure: CYSTOGRAM;  Surgeon: Hollice Espy, MD;  Location: [facility] ORS;  Service: Urology;  Laterality: N/A;  . CYSTOSCOPY N/A 08/17/2016   Procedure: CYSTOSCOPY EXAM UNDER ANESTHESIA;  Surgeon: Hollice Espy, MD;  Location: [facility] ORS;  Service: Urology;  Laterality: N/A;  . CYSTOSCOPY W/ RETROGRADES Bilateral 08/17/2016   Procedure: CYSTOSCOPY WITH RETROGRADE PYELOGRAM;  Surgeon: Hollice Espy, MD;  Location: [facility] ORS;  Service: Urology;  Laterality: Bilateral;  . CYSTOSCOPY WITH STENT PLACEMENT Right 08/17/2016   Procedure: CYSTOSCOPY WITH STENT PLACEMENT;  Surgeon: Hollice Espy, MD;  Location: [facility] ORS;  Service: Urology;  Laterality: Right;  . heart stent'  2013  . kidney stent Right 2018  . LYMPH NODE BIOPSY  2011   diagnosis of hodgkins lymphoma  . PELVIC LYMPH NODE DISSECTION N/A 07/29/2016   Procedure: PELVIC/AORTIC LYMPH NODE SAMPLING;  Surgeon: [name], MD;  Location: [facility] ORS;  Service: Gynecology;  Laterality: N/A;  . PORTA CATH INSERTION N/A 09/22/2016   Procedure: Glori Luis Cath Insertion;  Surgeon: [name], MD;  Location: [facility];  Service: Cardiovascular;  Laterality: N/A;  . PORTA CATH REMOVAL N/A 11/17/2016   Procedure: Glori Luis Cath  Removal;  Surgeon: [name], MD;  Location: [facility];  Service: Cardiovascular;  Laterality: N/A;  . ROBOTIC ASSISTED TOTAL HYSTERECTOMY WITH BILATERAL SALPINGO OOPHERECTOMY N/A 07/29/2016   Procedure: ROBOTIC ASSISTED TOTAL HYSTERECTOMY WITH BILATERAL SALPINGO OOPHORECTOMY;  Surgeon: [name], MD;  Location: [facility] ORS;  Service: Gynecology;  Laterality: N/A;  . SENTINEL NODE BIOPSY N/A 07/29/2016   Procedure: SENTINEL NODE BIOPSY;  Surgeon: [name], MD;  Location: [facility] ORS;  Service: Gynecology;  Laterality: N/A;  . transobturator sling N/A 2009    [place]    There were no vitals filed for this visit.  Subjective Assessment - 08/05/17 1620    Subjective   Pt presents for OT  visit 23/36 to address BLE lymphedema 2/2 lymphoma and endometrial cancer treatment. Pt brings remade compression garment to clinic, but tells me she has not tried it on, or worn it since receiving it. Pt reports she discontinued  visits w/ chiropractor for ongoing back pain.since Christmas 2018.  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  "I think my leg has been pretty good. It doesn't look all that different . I havent been wrapping or wearing any compression in a few weeks."    Pertinent History  CVI, PAD, Ultrasound negative for DVT and + for baker's cyst; chemo-induced periferal neuropathy, endometrial ca, pelvic adhesive disease, PAD, recent weight loss reported as ~ 25-30 pounds, hx Hodgkin's lymphoma ( s/p xrt and chemo), OA, abdominal hysterectomy, oophorectomy, pelvic LN disection(pelvic/ aortic node biopsy 0/3 LN), post op infection/ vaginal cuff abcess, severe pelvic adhesive disease, , s/p chemotherapy and whole pelvic radiation    Limitations  leg swelling, L>R) , BLE pain/ discomfort, decreased sensation / neuropathic pain in feet and hands, difficulty walking, decreased standing tolerance, decreased balance w/ increased falls risk, difficulty fitting LB clothing and preferred street shoes, mildly impaired functional mobility and transferes    Patient Stated Goals  decrease leg swelling and pain.     Currently in Pain?  Yes not rated. Displays compensatiry opstures when completing STS transfers.    Pain Location  Back    Pain Onset  Other (comment) post chemo                   OT Treatments/Exercises (OP) - 08/05/17 0001      ADLs   ADL Education Given  Yes      Manual Therapy   Manual Therapy  Edema management             OT Education - 08/05/17 1626    Education provided   Yes    Education Details  Reviewed lymphedema self care protocols    Person(s) Educated  Patient    Methods  Explanation;Demonstration    Comprehension  Verbalized understanding;Returned demonstration          OT Long Term Goals - 08/05/17 1631      OT LONG TERM GOAL #1   Title  Pt independent w/ lymphedema precautions and prevention principals and strategies to limit LE progression and infection risk.    Baseline  dependent    Time  2    Period  Weeks    Status  Achieved      OT LONG TERM GOAL #2   Title  Lymphedema (LE) management/ self-care: Pt able to apply thigh length, multi layered, gradient compression wraps with modified independence ( with extra time)  using proper  techniques within 2 weeks to achieve optimal limb volume reductions in preparation for fitting compression garments/ devices bilaterally.    Baseline  dependent    Time  2    Period  Weeks    Status  Achieved      OT LONG TERM GOAL #3   Title  Lymphedema (LE) management/ self-care:  Pt to achieve at least 10% LLE limb volume reductions  in the full LLE and 5% limb volume reduction in the RLE during Intensive Phase CDT to limit LE progression, to reduce pain, to improve ADLs performance.    Baseline  Max A    Time  12    Period  Weeks    Status  Achieved      OT LONG TERM GOAL #4   Title  Lymphedema (LE) management/ self-care:  Pt to tolerate daily compression wraps, compression garments and/ or HOS devices in keeping w/ prescribed wear regime within 1 week of issue date of each to progress and retain clinical and functional gains and to limit LE progression.    Baseline  Max A    Time  12    Period  Weeks    Status  Partially Met      OT LONG TERM GOAL #5   Title  Lymphedema (LE) management/ self-care:  During Management Phase CDT Pt to sustain current limb volumes within 5%, and all other clinical gains achieved during OT treatment WITH MAXIMUM CAREGIVER ASSISTANCE  to control leg and foot swelling, to   limit LE progression, to dec4rease infection risk and to limit further functional decline.    Baseline  dependent    Time  6    Period  Months    Status  On-going            Plan - 08/05/17 1627    Clinical Impression Statement  Pt declines donning compression garment remake due to recent hx of back pain . Pt agrees with plan to don garment and assess change in length , and notify OT if she has any problems with garment. Reviewed LE self care and discussed plan for 3 month F/U  in May when warmer weather may impact control of leg swelling. LLE appears somewhatmore swollen than when last seen on 1/31. Pt agrees to call PRN if she has      concerns before May. Pt is Discharged from Occupational Therapy for Lymphedema care.  All goals met., or partially met. Pt request new referral for 3 month FU.    Occupational Profile and client history currently impacting functional performance  Pt tolerated all aspects of manual CDT today. Pt continues to build   increased tolerance and compliance with thigh length bandaging regime. ZPt agrees w/ plan to complete repeat LLE comparative limb volumetrics    next visit.    Occupational performance deficits (Please refer to evaluation for details):  ADL's;IADL's;Rest and Sleep;Work;Leisure;Social Participation;Other;Play body image    Rehab Potential  Good    OT Frequency  --    OT Duration  Other (comment) 3 month F/U. Pt will request new referral for visit in May.    OT Home Exercise Plan  simple self MLD daily,, daily skin care, daily full time compression thigh high , ccl 3       Patient will benefit from skilled therapeutic intervention in order to improve the following deficits and impairments:     Visit Diagnosis: Lymphedema    Problem List Patient Active Problem List  Diagnosis Date Noted  . B12 deficiency anemia 07/24/2017  . Normocytic anemia 07/18/2017  . Back pain 07/18/2017  . Chemotherapy-induced peripheral neuropathy ([place])  12/10/2016  . 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 Endometrial cancer ([place])   . Pelvic adhesive disease   . Lymphedema 07/20/2016  . Chronic venous insufficiency 07/20/2016  . PAD (peripheral artery disease) ([place]) 07/20/2016  . Postmenopausal bleeding 07/14/2016  . Class 1 obesity due to excess calories without serious comorbidity with body mass index (BMI) of 34.0 to 34.9 in adult 05/27/2016  . Bronchiectasis ([place]) 12/19/2015  . Pre-syncope 12/19/2015  . Hx of colonic polyps   . Benign neoplasm of sigmoid colon   . Prolapsed, uterovaginal, incomplete 06/24/2014  . Routine general medical examination at a health care facility 12/30/2012  . Obesity (BMI 30-39.9) 12/30/2012  . Hx of multiple pulmonary nodules 12/28/2012  . Plantar fasciitis of right foot 12/28/2012  . Neuropathy due to chemotherapeutic drug ([place]) 09/12/2012  . Hip pain, bilateral 09/12/2012  . History of Hodgkin's lymphoma 09/12/2012  . Coronary artery disease   . Hyperlipidemia     Andrey Spearman, MS, OTR/L, [facility] 08/05/17 4:35 PM  [place] MAIN [facility] SERVICES [address] [place], [place], [address] Phone: [phone]   Fax:  [phone]  Name: Jaime Hall MRN: [id number] Date of Birth: 10/23/1950

## 2017-08-06 ENCOUNTER — Encounter: Payer: Self-pay | Admitting: Internal Medicine

## 2017-08-08 ENCOUNTER — Encounter: Payer: Self-pay | Admitting: Internal Medicine

## 2017-08-10 ENCOUNTER — Telehealth: Payer: Self-pay | Admitting: Internal Medicine

## 2017-08-10 MED ORDER — "SYRINGE 25G X 1"" 3 ML MISC": 0 refills | Status: DC

## 2017-08-10 MED ORDER — CYANOCOBALAMIN 1000 MCG/ML IJ SOLN
INTRAMUSCULAR | 2 refills | Status: DC
Start: 2017-08-10 — End: 2019-03-02

## 2017-08-10 NOTE — Telephone Encounter (Signed)
call patient.  Set up teaching on B12   Injections,  meds sent.

## 2017-08-11 ENCOUNTER — Ambulatory Visit (INDEPENDENT_AMBULATORY_CARE_PROVIDER_SITE_OTHER): Payer: PPO

## 2017-08-11 DIAGNOSIS — D518 Other vitamin B12 deficiency anemias: Secondary | ICD-10-CM

## 2017-08-11 MED ORDER — CYANOCOBALAMIN 1000 MCG/ML IJ SOLN
1000.0000 ug | Freq: Once | INTRAMUSCULAR | Status: AC
  Administered 2017-08-11: 1000 ug via INTRAMUSCULAR

## 2017-08-11 NOTE — Progress Notes (Signed)
Ok to send in B12 for pt to give at home with directions as ordered by Dr Derrel Nip.

## 2017-08-11 NOTE — Progress Notes (Addendum)
Patient comes in for B 12 injection.  Injected right deltoid.  Patient tolerated injection well.  Spoken with patient, she verbalized she would like to be able to giver herself her own b12 injections.  I instructed patient the correct steps/procedure to giver herself an injection. She then demonstrated the steps to complete the injection which were correct.  Patient would like rx sent to pharmacy for pick up.  Reviewed.  Ok to send in B12 for pt to give injections at home.  Pt instructed on proper technique per CMA.    Dr Nicki Reaper

## 2017-08-11 NOTE — Telephone Encounter (Signed)
Left detailed message for patient needs 30 minute nurse visit for teaching B 12 injections. PEC may schedule.

## 2017-08-13 NOTE — Progress Notes (Signed)
Medication sent.

## 2017-08-19 ENCOUNTER — Other Ambulatory Visit: Payer: Self-pay | Admitting: *Deleted

## 2017-08-19 ENCOUNTER — Ambulatory Visit
Admission: RE | Admit: 2017-08-19 | Discharge: 2017-08-19 | Disposition: A | Discharge status: Home / Self Care | Payer: PPO | Source: Ambulatory Visit | Attending: Radiation Oncology | Admitting: Radiation Oncology

## 2017-08-19 ENCOUNTER — Other Ambulatory Visit: Payer: Self-pay

## 2017-08-19 ENCOUNTER — Encounter: Payer: Self-pay | Admitting: Radiation Oncology

## 2017-08-19 VITALS — BP 146/81 | HR 80 | Temp 96.9°F | Resp 20 | Wt 174.2 lb

## 2017-08-19 DIAGNOSIS — C541 Malignant neoplasm of endometrium: Secondary | ICD-10-CM

## 2017-08-19 DIAGNOSIS — Z9221 Personal history of antineoplastic chemotherapy: Secondary | ICD-10-CM | POA: Insufficient documentation

## 2017-08-19 DIAGNOSIS — Z79899 Other long term (current) drug therapy: Secondary | ICD-10-CM | POA: Diagnosis not present

## 2017-08-19 DIAGNOSIS — Z8542 Personal history of malignant neoplasm of other parts of uterus: Secondary | ICD-10-CM | POA: Diagnosis not present

## 2017-08-19 DIAGNOSIS — Z923 Personal history of irradiation: Secondary | ICD-10-CM | POA: Diagnosis not present

## 2017-08-19 DIAGNOSIS — Y842 Radiological procedure and radiotherapy as the cause of abnormal reaction of the patient, or of later complication, without mention of misadventure at the time of the procedure: Secondary | ICD-10-CM | POA: Insufficient documentation

## 2017-08-19 NOTE — Progress Notes (Signed)
Radiation Oncology Follow up Note  Name: Jaime Hall   Date:   08/19/2017 MRN:  [id number] DOB: 09-21-1950    This 67 y.o. 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 She did have extensive adhesive disease in the pelvis.. She had a CT scan back in March 2018 showing interval resolution of the abscess involving the vaginal cuff with no evidence of metastatic disease. She seen today in routine follow-up she's been having what she describes as mushy bowel movements. She also has lower back pain which seems to be exacerbated after the first of the year. She also has some occasional rectal incontinence. She specifically denies vaginal discharge or any dysuria.  COMPLICATIONS OF TREATMENT: none  FOLLOW UP COMPLIANCE: keeps appointments   PHYSICAL EXAM:  BP (!) 146/81   Pulse 80   Temp (!) 96.9 F (36.1 C)   Resp 20   Wt 174 lb 2.6 oz (79 kg)   BMI 32.91 kg/m  Abdomen is benign. Well-developed well-nourished patient in NAD. HEENT reveals PERLA, EOMI, discs not visualized.  Oral cavity is clear. No oral mucosal lesions are identified. Neck is clear without evidence of cervical or supraclavicular adenopathy. Lungs are clear to A&P. Cardiac examination is essentially unremarkable with regular rate and rhythm without murmur rub or thrill. Abdomen is benign with no organomegaly or masses noted. Motor sensory and DTR levels are equal and symmetric in the upper and lower extremities. Cranial nerves II through XII are grossly intact. Proprioception is intact. No peripheral adenopathy or edema is  identified. No motor or sensory levels are noted. Crude visual fields are within normal range.  RADIOLOGY RESULTS: Repeat abdominal CT scan ordered  PLAN: At this time am starting the patient will residue diet and I've instructed her to take Imodium daily. I'm also ordering a CT scan of the abdomen and pelvis for evaluation since it's been on a year since her last CT scan. I've set up a follow-up appointment in 2 weeks and review the findings of those scans with her. Also monitor her bowel function to see if we've help the situation with my above treatment plan. Patient understands my instructions.  I would like to take this opportunity to thank you for allowing me to participate in the care of your patient.Noreene Filbert, MD

## 2017-08-20 ENCOUNTER — Telehealth: Payer: Self-pay | Admitting: *Deleted

## 2017-08-20 NOTE — Telephone Encounter (Signed)
Pt had came up and speak to me about her bowels. It is toothpaste consistency and sometimes she feels urge to go to bathroom and sometimes she has it in her panties and she wears depends and gets embarrassed . She also had day sweat and night sweats. She has been having back pain really low like in her bottom. She had wraps for her swollen leg and went all the way up her leg and it hurt a muscle and nerve and it is painful. She has exercises to do through PT showing her how to do it and it is helping.  I spoke with Janese Banks and she said to wait til ct scan will see what we can do. Such as gi consult.  Spoke to [employer] with gyn and she said same thing. I will call pt after results of scan with plan. She is greatful.

## 2017-08-30 ENCOUNTER — Other Ambulatory Visit: Payer: Self-pay | Admitting: Radiation Oncology

## 2017-08-30 ENCOUNTER — Ambulatory Visit
Admission: RE | Admit: 2017-08-30 | Discharge: 2017-08-30 | Disposition: A | Discharge status: Home / Self Care | Payer: PPO | Source: Ambulatory Visit | Attending: Radiation Oncology | Admitting: Radiation Oncology

## 2017-08-30 DIAGNOSIS — Z90710 Acquired absence of both cervix and uterus: Secondary | ICD-10-CM | POA: Insufficient documentation

## 2017-08-30 DIAGNOSIS — C541 Malignant neoplasm of endometrium: Secondary | ICD-10-CM | POA: Diagnosis not present

## 2017-08-30 DIAGNOSIS — Z8542 Personal history of malignant neoplasm of other parts of uterus: Secondary | ICD-10-CM | POA: Insufficient documentation

## 2017-08-30 LAB — POCT I-STAT CREATININE
Creatinine, Ser: 1.7 mg/dL — ABNORMAL HIGH (ref 0.44–1.00)
Creatinine, Ser: 1.8 mg/dL — ABNORMAL HIGH (ref 0.44–1.00)

## 2017-08-30 MED ORDER — IOPAMIDOL (ISOVUE-300) INJECTION 61%: 100.0000 mL | Freq: Once | INTRAVENOUS | Status: DC | PRN

## 2017-09-02 ENCOUNTER — Telehealth: Payer: Self-pay | Admitting: *Deleted

## 2017-09-02 NOTE — Telephone Encounter (Signed)
Called pt and left her a message that the scna was normal. Read through the specifics of the scan. Asked her if she is still having the toothpaste consistency stools and if she is still not being able to know when they are coming or not.  If she is still having the issue then we will refer her to GI. I will await her call back.

## 2017-09-02 NOTE — Telephone Encounter (Signed)
Pt called me back and told me that dr Baruch Gouty told me that she should take imodium to see if it thickens the stool for her.  She has been taking it about every other day and it is helped and she has not had any accidents this week with her bowels and not being able to sense she is having a movement.  She is still having low back pain in the region of her buttocks. She has seen chiropractor in the past and she will call him and see if she can have an appt. If she needs anything after speaking to Dr. Baruch Gouty she will let me know

## 2017-09-06 ENCOUNTER — Ambulatory Visit
Admission: RE | Admit: 2017-09-06 | Discharge: 2017-09-06 | Disposition: A | Discharge status: Home / Self Care | Payer: PPO | Source: Ambulatory Visit | Attending: Radiation Oncology | Admitting: Radiation Oncology

## 2017-09-06 ENCOUNTER — Other Ambulatory Visit: Payer: Self-pay

## 2017-09-06 VITALS — BP 104/65 | Temp 98.3°F | Ht 61.0 in | Wt 166.0 lb

## 2017-09-06 DIAGNOSIS — C541 Malignant neoplasm of endometrium: Secondary | ICD-10-CM

## 2017-09-06 DIAGNOSIS — Z8542 Personal history of malignant neoplasm of other parts of uterus: Secondary | ICD-10-CM | POA: Insufficient documentation

## 2017-09-06 DIAGNOSIS — Z923 Personal history of irradiation: Secondary | ICD-10-CM | POA: Diagnosis not present

## 2017-09-06 NOTE — Progress Notes (Signed)
Radiation Oncology Follow up Note  Name: Jaime Hall   Date:   09/06/2017 MRN:  [id number] DOB: 1951/05/07    This 66 y.o. 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 She was having problems with diarrhea per on low residue diet as well as Imodium and the diarrhea has pretty much resolved. I also ordered a CT scan of.abdomen and pelvis showing no evidence of recurrent or metastatic disease.  COMPLICATIONS OF TREATMENT: none  FOLLOW UP COMPLIANCE: keeps appointments   PHYSICAL EXAM:  BP 104/65 (BP Location: Left Arm, Patient Position: Sitting, Cuff Size: Large)   Temp 98.3 F (36.8 C) (Tympanic)   Ht 5\' 1"  (1.549 m)   Wt 166 lb 0.1 oz (75.3 kg)   BMI 31.37 kg/m  Well-developed well-nourished patient in NAD. HEENT reveals PERLA, EOMI, discs not visualized.  Oral cavity is clear. No oral mucosal lesions are identified. Neck is clear without evidence of cervical or supraclavicular adenopathy. Lungs are clear to A&P. Cardiac examination is essentially unremarkable with regular rate and rhythm without murmur rub or thrill. Abdomen is benign with no organomegaly or masses noted. Motor sensory and DTR levels are equal and symmetric in the upper and lower extremities. Cranial nerves II through XII are grossly intact. Proprioception is intact. No peripheral adenopathy or edema is identified. No motor or sensory levels are noted. Crude visual fields are within normal range.  RADIOLOGY RESULTS: CT scan is reviewed and compatible with the above-stated findings  PLAN: present time patient is doing well much improved from her chronic diarrhea with low-dose Imodium I've instructed her to go to use it on a when  necessary basis. Patient comprehends my treatment plan well she will be seeing GYN oncology in about 2-3 weeks while the pelvic exam at that time. I have asked to see her back in 6 months for follow-up. She knows to call sooner with any concerns.  I would like to take this opportunity to thank you for allowing me to participate in the care of your patient.Noreene Filbert, MD

## 2017-09-17 DIAGNOSIS — M9901 Segmental and somatic dysfunction of cervical region: Secondary | ICD-10-CM | POA: Diagnosis not present

## 2017-09-17 DIAGNOSIS — M5137 Other intervertebral disc degeneration, lumbosacral region: Secondary | ICD-10-CM | POA: Diagnosis not present

## 2017-09-17 DIAGNOSIS — M9903 Segmental and somatic dysfunction of lumbar region: Secondary | ICD-10-CM | POA: Diagnosis not present

## 2017-09-17 DIAGNOSIS — M531 Cervicobrachial syndrome: Secondary | ICD-10-CM | POA: Diagnosis not present

## 2017-09-21 ENCOUNTER — Telehealth: Payer: Self-pay

## 2017-09-21 NOTE — Telephone Encounter (Signed)
Voicemail left with Jaime Hall to return call regarding questions she has. Oncology Nurse Navigator Documentation  Navigator Location: [facility] (09/21/17 1300)   )Navigator Encounter Type: Telephone (09/21/17 1300) Telephone: [facility] [facility] Call (09/21/17 1300)                                                  Time Spent with Patient: 15 (09/21/17 1300)

## 2017-09-22 ENCOUNTER — Telehealth: Payer: Self-pay

## 2017-09-22 ENCOUNTER — Ambulatory Visit: Payer: PPO

## 2017-09-22 NOTE — Telephone Encounter (Signed)
Received call from Jaime Hall. Reports back pain, especially when squatting. Middle back hurts as well. Also reports burning right pubic area to groin. Negative CT scan in March. Spoke with triage nurse who recommends she sees her PCP. She has Gyn Onc appt 4/17. Oncology Nurse Navigator Documentation  Navigator Location: [facility] (09/22/17 1400)   )Navigator Encounter Type: Telephone (09/22/17 1400) Telephone: Outgoing Call;Symptom Mgt (09/22/17 1400)                   Patient Visit Type: GynOnc (09/22/17 1400)                              Time Spent with Patient: 30 (09/22/17 1400)

## 2017-09-23 ENCOUNTER — Ambulatory Visit: Payer: PPO | Admitting: Family Medicine

## 2017-09-23 ENCOUNTER — Ambulatory Visit (INDEPENDENT_AMBULATORY_CARE_PROVIDER_SITE_OTHER): Payer: PPO | Admitting: Family Medicine

## 2017-09-23 ENCOUNTER — Encounter: Payer: Self-pay | Admitting: Family Medicine

## 2017-09-23 VITALS — BP 130/70 | HR 73 | Temp 98.0°F | Resp 16 | Wt 166.1 lb

## 2017-09-23 DIAGNOSIS — M545 Low back pain: Secondary | ICD-10-CM

## 2017-09-23 DIAGNOSIS — R3915 Urgency of urination: Secondary | ICD-10-CM

## 2017-09-23 LAB — POC URINALSYSI DIPSTICK (AUTOMATED)
Bilirubin, UA: 1
Blood, UA: NEGATIVE
Glucose, UA: NEGATIVE
Ketones, UA: 5
Nitrite, UA: NEGATIVE
Protein, UA: 15
Spec Grav, UA: >=1.03 — AB
Urobilinogen, UA: 1 U/dL
pH, UA: 5.5

## 2017-09-23 LAB — URINALYSIS, MICROSCOPIC ONLY

## 2017-09-23 LAB — BASIC METABOLIC PANEL
BUN: 24 mg/dL — ABNORMAL HIGH (ref 6–23)
CO2: 28 mEq/L (ref 19–32)
Calcium: 9.6 mg/dL (ref 8.4–10.5)
Chloride: 106 mEq/L (ref 96–112)
Creatinine, Ser: 1.51 mg/dL — ABNORMAL HIGH (ref 0.40–1.20)
GFR: 36.43 mL/min — ABNORMAL LOW (ref >60.00)
Glucose, Bld: 87 mg/dL (ref 70–99)
Potassium: 5.3 mEq/L — ABNORMAL HIGH (ref 3.5–5.1)
Sodium: 140 mEq/L (ref 135–145)

## 2017-09-23 MED ORDER — CEPHALEXIN 500 MG PO CAPS
500.0000 mg | ORAL_CAPSULE | Freq: Two times a day (BID) | ORAL | 0 refills | Status: DC
Start: 2017-09-23 — End: 2017-10-04

## 2017-09-23 NOTE — Progress Notes (Signed)
Subjective:    Patient ID: Jaime Hall, female    DOB: 08/17/1950, 67 y.o.   MRN: [id number]  HPI  Jaime Hall is a 67 year old female who presents today with back pain that has been present for approximately one month. Pain is noted in her lower back/buttocks and started after wrapping left leg for lymphedema on 08/05/17 which "changed" her gait and caused discomfort. Lymphedema is result of removal of lymph nodes after history of endometrial cancer.  After this was removed, symptoms improved.    Location: lower back/buttocks Quality: 1 to 2   Onset: One month ago Worse with: movement of sitting   Better with:  Sitting and alternating standing       Radiation: No Trauma: No Best sitting/standing/leaning forward:  No Associated mild suprapubic pain  that can occur intermittently. No dysuria, hematuria, or frequency noted. Associated urgency and hesitance occurs intermittently.  Red Flags Fecal/urinary incontinence: No Numbness/Weakness: No Fever/chills/sweats: No Night pain:  No Unexplained weight loss: No No relief with bedrest:  No h/o cancer/immunosuppression:  History of endometrial cancer and lymphoma IV drug use:  No PMH of osteoporosis or chronic steroid use:   She has a history of back pain on 07/16/17, where she tried celebrex that improved her symptoms. She describes this symptom as new as she had full resolution of prior episode.   Review of Systems  Constitutional: Negative for chills, fatigue and fever.  Respiratory: Negative for cough, shortness of breath and wheezing.   Cardiovascular: Negative for chest pain and palpitations.  Gastrointestinal: Negative for abdominal pain, diarrhea, nausea and vomiting.  Genitourinary: Negative for dysuria, flank pain, hematuria and urgency.  Musculoskeletal: Positive for back pain.  Skin: Negative for rash.  Neurological: Negative for dizziness and weakness.  Psychiatric/Behavioral:       Denies depressed or anxious mood  today   Past Medical History:  Diagnosis Date  . Anxiety   . Cervicalgia   . Coronary artery disease    a. 02/2012 Stress echo: severe anterior wall ischemia;  b. 02/2012 Cath/PCI: LAD 95p (3.0 x 15 Xience EX DES), D1 90ost (PTCA - bifurcational dzs), EF 45% with anterior HK;  b. 02/2013 Ex MV: fixed anterior defect w/ minor reversibility, nl EF-->Med Rx.  . Endometrial cancer ([place])    a. 07/2016 s/p robotic hysterectomy, BSO w/ washings, sentinel node inj, mapping, bx, adhesiolysis.  . Essential hypertension, benign   . Fibrocystic breast disease   . GERD (gastroesophageal reflux disease)   . Gestational hypertension   . Heart murmur   . History of anemia   . History of blood transfusion   . Hodgkin's lymphoma ([place]) 2011   a. s/p radiation and chemo therapy  . Osteoarthritis   . Polycystic ovarian disease      Social History   Socioeconomic History  . Marital status: Widowed    Spouse name: Not on file  . Number of children: Not on file  . Years of education: Not on file  . Highest education level: Not on file  Occupational History  . Not on file  Social Needs  . Financial resource strain: Not on file  . Food insecurity:    Worry: Not on file    Inability: Not on file  . Transportation needs:    Medical: Not on file    Non-medical: Not on file  Tobacco Use  . Smoking status: Former Smoker    Packs/day: 1.00    Years: 30.00  Pack years: 30.00    Types: Cigarettes    Last attempt to quit: 07/24/2002    Years since quitting: 15.1  . Smokeless tobacco: Never Used  . Tobacco comment: quit smoking in 2000  Substance and Sexual Activity  . Alcohol use: Yes    Comment: occasional, 1-2 drinks per month  . Drug use: No  . Sexual activity: Never  Lifestyle  . Physical activity:    Days per week: Not on file    Minutes per session: Not on file  . Stress: Not on file  Relationships  . Social connections:    Talks on phone: Not on file    Gets together: Not on file     Attends religious service: Not on file    Active member of club or organization: Not on file    Attends meetings of clubs or organizations: Not on file    Relationship status: Not on file  . Intimate partner violence:    Fear of current or ex partner: Not on file    Emotionally abused: Not on file    Physically abused: Not on file    Forced sexual activity: Not on file  Other Topics Concern  . Not on file  Social History Narrative  . Not on file    Past Surgical History:  Procedure Laterality Date  . ABDOMINAL HYSTERECTOMY    . bladder sling    . CARDIAC CATHETERIZATION  02/2012   [facility] 1 stent place  . CERVICAL POLYPECTOMY    . CHOLECYSTECTOMY  1982  . COLONOSCOPY WITH PROPOFOL N/A 02/05/2015   Procedure: COLONOSCOPY WITH PROPOFOL;  Surgeon: Lucilla Lame, MD;  Location: [facility];  Service: Endoscopy;  Laterality: N/A;  . CORONARY ANGIOPLASTY  02/2012   left/right s/p balloon  . CYSTOGRAM N/A 08/17/2016   Procedure: CYSTOGRAM;  Surgeon: Hollice Espy, MD;  Location: [facility] OR[facility];  Service: Urology;  Laterality: N/A;  . CYSTOSCOPY N/A 08/17/2016   Procedure: CYSTOSCOPY EXAM UNDER ANESTHESIA;  Surgeon: Hollice Espy, MD;  Location: [facility] OR[facility];  Service: Urology;  Laterality: N/A;  . CYSTOSCOPY W/ RETROGRADES Bilateral 08/17/2016   Procedure: CYSTOSCOPY WITH RETROGRADE PYELOGRAM;  Surgeon: Hollice Espy, MD;  Location: [facility] OR[facility];  Service: Urology;  Laterality: Bilateral;  . CYSTOSCOPY WITH STENT PLACEMENT Right 08/17/2016   Procedure: CYSTOSCOPY WITH STENT PLACEMENT;  Surgeon: Hollice Espy, MD;  Location: [facility];  Service: Urology;  Laterality: Right;  . heart stent'  2013  . kidney stent Right 2018  . LYMPH NODE BIOPSY  2011   diagnosis of hodgkins lymphoma  . PELVIC LYMPH NODE DISSECTION N/A 07/29/2016   Procedure: PELVIC/AORTIC LYMPH NODE SAMPLING;  Surgeon: [name], MD;  Location: [facility] OR[facility];  Service: Gynecology;  Laterality: N/A;  . PORTA CATH INSERTION N/A 09/22/2016    Procedure: Glori Luis Cath Insertion;  Surgeon: [name], MD;  Location: [place] [facility];  Service: Cardiovascular;  Laterality: N/A;  . PORTA CATH REMOVAL N/A 11/17/2016   Procedure: Glori Luis Cath Removal;  Surgeon: [name], MD;  Location: [place] [facility];  Service: Cardiovascular;  Laterality: N/A;  . ROBOTIC ASSISTED TOTAL HYSTERECTOMY WITH BILATERAL SALPINGO OOPHERECTOMY N/A 07/29/2016   Procedure: ROBOTIC ASSISTED TOTAL HYSTERECTOMY WITH BILATERAL SALPINGO OOPHORECTOMY;  Surgeon: [name], MD;  Location: [facility];  Service: Gynecology;  Laterality: N/A;  . SENTINEL NODE BIOPSY N/A 07/29/2016   Procedure: SENTINEL NODE BIOPSY;  Surgeon: [name], MD;  Location: [facility];  Service: Gynecology;  Laterality: N/A;  . transobturator sling N/A 2009   [place]    Family History  Problem Relation Age of Onset  . ALS Father   . Polymyositis Father   . Diabetes Brother   . Cancer Maternal Aunt        breast  . Breast cancer Maternal Aunt        30's  . Stroke Maternal Grandmother   . Cancer Maternal Grandfather        prostate  . Stroke Maternal Grandfather     Allergies  Allergen Reactions  . Adhesive [Tape] Other (See Comments)    Burns the skin  . Z-Pak [Azithromycin] Itching  . Antifungal [Miconazole Nitrate] Rash  . Sulfa Antibiotics Nausea And Vomiting and Rash    N/T  . Tolnaftate Rash    Current Outpatient Medications on File Prior to Visit  Medication Sig Dispense Refill  . acetaminophen (TYLENOL) 500 MG tablet Take 1,000 mg by mouth every 6 (six) hours as needed for mild pain.     Marland Kitchen aspirin EC 81 MG tablet Take 81 mg by mouth at bedtime.    Marland Kitchen atorvastatin (LIPITOR) 10 MG tablet TAKE ONE TABLET BY MOUTH ONCE DAILY 90 tablet 3  . capsicum (ZOSTRIX) 0.075 % topical cream Apply 1 application topically 4 (four) times daily. To feet 28.3 g 0  . Cholecalciferol (VITAMIN D3) 2000 units TABS Take 2,000 Units by mouth 3 (three)  times a week.    . cyanocobalamin (,VITAMIN B-12,) 1000 MCG/ML injection 1 ml injected IM weekly x 4 ,  Then monthly thereafter 10 mL 2  . diazepam (VALIUM) 5 MG tablet Take 1 tablet (5 mg total) by mouth every 12 (twelve) hours as needed for anxiety. 30 tablet 0  . esomeprazole (NEXIUM) 20 MG capsule Take 20 mg by mouth daily before breakfast.     . ibuprofen (ADVIL,MOTRIN) 200 MG tablet Take 200 mg by mouth every 6 (six) hours as needed for mild pain.     . metoprolol tartrate (LOPRESSOR) 25 MG tablet TAKE ONE TABLET BY MOUTH TWICE DAILY 180 tablet 3  . nitroGLYCERIN (NITROSTAT) 0.4 MG SL tablet Place 1 tablet (0.4 mg total) under the tongue every 5 (five) minutes as needed for chest pain. 25 tablet 3  . Syringe/Needle, Disp, (SYRINGE 3CC/25GX1") 25G X 1" 3 ML MISC Use for b12 injections 50 each 0  . Turmeric 500 MG CAPS Take 500 mg by mouth 2 (two) times daily.     No current facility-administered medications on file prior to visit.     BP 130/70 (BP Location: Left Arm, Patient Position: Sitting, Cuff Size: Normal)   Pulse 73   Temp 98 F (36.7 C) (Oral)   Resp 16   Wt 166 lb 2 oz (75.4 kg)   SpO2 97%   BMI 31.39 kg/m        Objective:   Physical Exam  Constitutional: She is oriented to person, place, and time. She appears well-developed and well-nourished.  Eyes: Pupils are equal, round, and reactive to light. No scleral icterus.  Neck: Neck supple.  Cardiovascular: Normal rate, regular rhythm and intact distal pulses.  Pulmonary/Chest: Effort normal and breath sounds normal. She has no wheezes. She has no rales.  Abdominal: Soft. Bowel sounds are normal. There is no tenderness. There is no rebound.  Musculoskeletal: She exhibits no edema.  Spine with normal alignment and no deformity. No tenderness to vertebral process with palpation with the exception of mild tenderness noted  across buttocks.  Paraspinous muscles are not tender. ROM is full at lumbar sacral regions. Negative  Straight Leg raise. No CVA tenderness present. Able to heel/toe walk without pain.  Lymphadenopathy:    She has no cervical adenopathy.  Neurological: She is alert and oriented to person, place, and time. Coordination normal.  Skin: Skin is warm and dry. No rash noted.  Psychiatric: She has a normal mood and affect. Her behavior is normal. Judgment and thought content normal.       Assessment & Plan:  1. Acute bilateral low back pain, with sciatica presence unspecified Pain is improving and occurs with movement, particularly when going from a standing to sitting position then resolves. Prior episode of back pain noted in  07/16/17 improved and this is a separate occurrence per patient. Advised her that symptoms are most consistent with musculoskeletal discomfort, particularly as her pain is improving. She would like to try acetaminophen for discomfort and we will avoid NSAIDs due to UA noting protein in urine and last creatinine in chart prior to CT was elevated. Will also check her BMP today. If symptoms do not improve with treatment, a skeletal survey can be considered due to her history of lymphoma and endometrial cancer. Advised close follow up with PCP if symptoms do not improve, worsen, or new symptoms develop.   2. Urinary urgency UA + Leukocytes, negative for nitrites and blood; will empirically treat with cephalexin and send for culture. Advised patient to complete antibiotic and she should follow up if her symptoms do not improve in 2 to 3 days, worsen, she develops a fever >101, or back pain. Also advised her to increase water intake. Patient voiced understanding and agreed with plan.  Delano Metz, FNP-C

## 2017-09-23 NOTE — Patient Instructions (Addendum)
Please go to the lab before you leave.  Please complete antibiotic as directed. If you develop symptoms of fever >101, pain in your back, or do not improve with treatment that has been provided in 3 to 4 days, please follow up for further evaluation and treatment.   Use acetaminophen (Tylenol) for discomfort as directed on bottle. Do not exceed recommended dose on bottle.  If symptoms do not improve with treatment, worsen, or you develop new symptoms, please follow up for further evaluation.   Back Pain, Adult Back pain is very common. The pain often gets better over time. The cause of back pain is usually not dangerous. Most people can learn to manage their back pain on their own. Follow these instructions at home: Watch your back pain for any changes. The following actions may help to lessen any pain you are feeling:  Stay active. Start with short walks on flat ground if you can. Try to walk farther each day.  Exercise regularly as told by your doctor. Exercise helps your back heal faster. It also helps avoid future injury by keeping your muscles strong and flexible.  Do not sit, drive, or stand in one place for more than 30 minutes.  Do not stay in bed. Resting more than 1-2 days can slow down your recovery.  Be careful when you bend or lift an object. Use good form when lifting: ? Bend at your knees. ? Keep the object close to your body. ? Do not twist.  Sleep on a firm mattress. Lie on your side, and bend your knees. If you lie on your back, put a pillow under your knees.  Take medicines only as told by your doctor.  Put ice on the injured area. ? Put ice in a plastic bag. ? Place a towel between your skin and the bag. ? Leave the ice on for 20 minutes, 2-3 times a day for the first 2-3 days. After that, you can switch between ice and heat packs.  Avoid feeling anxious or stressed. Find good ways to deal with stress, such as exercise.  Maintain a healthy weight. Extra  weight puts stress on your back.  Contact a doctor if:  You have pain that does not go away with rest or medicine.  You have worsening pain that goes down into your legs or buttocks.  You have pain that does not get better in one week.  You have pain at night.  You lose weight.  You have a fever or chills. Get help right away if:  You cannot control when you poop (bowel movement) or pee (urinate).  Your arms or legs feel weak.  Your arms or legs lose feeling (numbness).  You feel sick to your stomach (nauseous) or throw up (vomit).  You have belly (abdominal) pain.  You feel like you may pass out (faint). This information is not intended to replace advice given to you by your health care provider. Make sure you discuss any questions you have with your health care provider. Document Released: 11/18/2007 Document Revised: 11/07/2015 Document Reviewed: 10/03/2013 Elsevier Interactive Patient Education  2018 [employer].  Urinary Tract Infection, Adult A urinary tract infection (UTI) is an infection of any part of the urinary tract. The urinary tract includes the:  Kidneys.  Ureters.  Bladder.  Urethra.  These organs make, store, and get rid of pee (urine) in the body. Follow these instructions at home:  Take over-the-counter and prescription medicines only as told by your  doctor.  If you were prescribed an antibiotic medicine, take it as told by your doctor. Do not stop taking the antibiotic even if you start to feel better.  Avoid the following drinks: ? Alcohol. ? Caffeine. ? Tea. ? Carbonated drinks.  Drink enough fluid to keep your pee clear or pale yellow.  Keep all follow-up visits as told by your doctor. This is important.  Make sure to: ? Empty your bladder often and completely. Do not to hold pee for long periods of time. ? Empty your bladder before and after sex. ? Wipe from front to back after a bowel movement if you are female. Use each tissue  one time when you wipe. Contact a doctor if:  You have back pain.  You have a fever.  You feel sick to your stomach (nauseous).  You throw up (vomit).  Your symptoms do not get better after 3 days.  Your symptoms go away and then come back. Get help right away if:  You have very bad back pain.  You have very bad lower belly (abdominal) pain.  You are throwing up and cannot keep down any medicines or water. This information is not intended to replace advice given to you by your health care provider. Make sure you discuss any questions you have with your health care provider. Document Released: 11/18/2007 Document Revised: 11/07/2015 Document Reviewed: 04/22/2015 Elsevier Interactive Patient Education  [employer].

## 2017-09-24 LAB — URINE CULTURE
MICRO NUMBER:: 90448247
SPECIMEN QUALITY:: ADEQUATE

## 2017-09-28 ENCOUNTER — Telehealth: Payer: Self-pay

## 2017-09-28 NOTE — Telephone Encounter (Signed)
Left message for patient to return my call. Please connect patient to office.

## 2017-09-28 NOTE — Telephone Encounter (Signed)
Patient was seen by Gregary Signs, NP on 09/23/17 for back needs to follow up with PCP for worsening kidney function no follow up scheduled with PCP scheduled advised not to take NSAIDs .  Next appt avaivable 10/28/17 . Can she be worked in sooner ?

## 2017-09-28 NOTE — Telephone Encounter (Signed)
Patient was returning your call

## 2017-09-28 NOTE — Telephone Encounter (Signed)
Patient scheduled.

## 2017-09-29 ENCOUNTER — Encounter: Payer: Self-pay | Admitting: Obstetrics and Gynecology

## 2017-09-29 ENCOUNTER — Inpatient Hospital Stay: Payer: PPO | Attending: Obstetrics and Gynecology | Admitting: Obstetrics and Gynecology

## 2017-09-29 VITALS — BP 113/72 | HR 78 | Temp 97.2°F | Resp 18 | Ht 61.0 in | Wt 165.5 lb

## 2017-09-29 DIAGNOSIS — Z90722 Acquired absence of ovaries, bilateral: Secondary | ICD-10-CM | POA: Diagnosis not present

## 2017-09-29 DIAGNOSIS — Z90710 Acquired absence of both cervix and uterus: Secondary | ICD-10-CM

## 2017-09-29 DIAGNOSIS — C541 Malignant neoplasm of endometrium: Secondary | ICD-10-CM | POA: Diagnosis not present

## 2017-09-29 DIAGNOSIS — Z87891 Personal history of nicotine dependence: Secondary | ICD-10-CM | POA: Diagnosis not present

## 2017-09-29 DIAGNOSIS — Z923 Personal history of irradiation: Secondary | ICD-10-CM | POA: Insufficient documentation

## 2017-09-29 DIAGNOSIS — Z9221 Personal history of antineoplastic chemotherapy: Secondary | ICD-10-CM | POA: Insufficient documentation

## 2017-09-29 NOTE — Progress Notes (Signed)
Gynecologic Oncology Interval Visit   Referring Provider: Dr. 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 G2P2 female who is seen in consultation from [name] and Garwin Brothers for endometrial cancer.   She presents today for follow up. She complains fatigue, bladder function issues with hesitancy; back pain which sounds quite debilitating; peripheral neuropathy symptoms; and feeling sad. She is struggling today because it is her deceased husband's birthday. It is her birthday tomorrow. She saw Dr. Baruch Gouty recently who ordered imaging to assess her symptoms.  CT A/P 08/31/2017 IMPRESSION: No evidence of recurrent or metastatic disease.  Gynecologic Oncology History Jaime Hall is a pleasant G54P2 female who is seen in consultation from [name] and Garwin Brothers for grade 1 endometrial cancer. See prior note for complete details.   Endometrial biopsy done 07/15/16 showed grade 1 endometrial cancer. Surgical management was recommended.   History is also significant for h/o Hodgkin's Lymphoma 2011 treated successfully at [facility] with chemo and RT.   Bladder sling for SUI 2009.  Benign cervical polyp removed 5/16. Has some leg edema due to chronic venous insufficiency.  Had angioplasty and cardiac stents in 2013 and was on Plavix for three years, now just ASA.  Surgical management was recommended.   On 07/29/2016 she underwent robotic hysterectomy, and bilateral salpingo-oophorectomy with washings as well as sentinel node injection, mapping, and biopsy; pelvic adhesiolysis including enterolysis and ovariolysis > 45 minutes. She was noted to have severe pelvic adhesive disease involving gynecologic organs and the bowel. The cul de sac was obliterated and adherent to the posterior cervix. A bubble test was performed and was negative.   Postop she presented with fevers on 08/05/2016 and was admitted. CT scan 08/05/1016  revealed small pockets of air in the posterior pelvic floor likely postsurgical. There is a 1.8 x 3.8 cm complex fluid collection containing pockets of air with somewhat organized walls within the pelvis at the hysterectomy bed most consistent with developing abscess. There is superior extension of loculated fluid along the left lateral pelvic wall anterior to the left psoas muscle. The component of the loculated fluid along the left lateral pelvic wall measures 2.0 x 4.0 cm and fluid collection along the anterior surface of the left psoas muscle measures approximately 1.8 x 3.6 cm. Blood cultures: + bacterimia with bacteroides fragilis; urine cultures negative.   She was treated with IV antibiotics with improvement in her symptoms. She was discharged on 09/05/2015 on Augmentin.   She was seen by Dr. Leonides Schanz and complained of vaginal leakage. 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 Subsequently she underwent a double tied tests with oral Pyridium as well as backfilling the bladder with blue. 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  Fluid in her vaginal vault was also appreciated. She was referred to Dr. [name], Urology on 08/14/2016. No obvious fistula or leak appreciated on CT urogram, although there is no complete delayed imaging to assess the right-sided ureter.   CT urogram 08/13/2016 IMPRESSION: 1. Stable size of an abscess at the vaginal cuff with superior extension along the left pelvic sidewall to the posterior margin of the mid sigmoid colon. 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 2. No hydronephrosis. Normal caliber ureters. No evidence of contrast extravasation from the ureters or bladder, with limitations as described. 3. No evidence of metastatic disease in the abdomen or pelvis. 4. Aortic  atherosclerosis.   On 08/17/3016 she underwent Exam under anesthesia, vaginoscopy, cystoscopy,  cystogram with interpretation of fluoroscopy less than 30 minutes, bilateral retrograde pyelogram, and right ureteral stent placement.   Intraoperative findings: No obvious ureterovaginal vesicovaginal fistula appreciated. Mild right hydroureteronephrosis down to level of the distal ureter with slight medial deviation without filling defects. Slight mucopurulent drainage from left apical vaginal cuff, specimen cultured.  Culture revealed bateroides fragilis and she was started on a 2 week course of Flagyl iniated on 08/24/2016.  Since that time she continues to do well. She presents today to discuss pathology findings and treatment plan. She is due to have the stent removed on 09/22/2016.   Pathology DIAGNOSIS:  A. SENTINEL LYMPH NODE, RIGHT MID OBTURATOR; EXCISION:  - NO TUMOR SEEN IN ONE LYMPH NODE (0/1).   B. SENTINEL LYMPH NODE, RIGHT EXTERNAL ILIAC; EXCISION:  - NO TUMOR SEEN IN ONE LYMPH NODE (0/1).   C. SENTINEL LYMPH NODE, LEFT EXTERNAL ILIAC; EXCISION:  - NO TUMOR SEEN IN ONE LYMPH NODE (0/1).   D. UTERUS WITH CERVIX, BILATERAL FALLOPIAN TUBES AND OVARIES;  HYSTERECTOMY AND BILATERAL SALPINGO-OOPHORECTOMY:  - SEROUS CARCINOMA, 4.5 CM(SEE NOTE).  - LYMPHOVASCULAR INVASION PRESENT.  - NO TUMOR SEEN IN BILATERAL OVARIES AND FALLOPIAN TUBES.  ENDOMETRIUM:  Procedure: Hysterectomy with bilateral salpingo-oophorectomy  Histologic Type: Serous carcinoma  Myometrial Invasion: Present       Depth of invasion (millimeters): 13 mm  Myometrial thickness (millimeters): 14 mm       Percentage of myometrial invasion: 93%  Uterine Serosa Involvement: Not identified  Cervical Stromal Involvement: Not identified    BIOMARKER REPORTING TEMPLATE:  p53 Expression: Abnormal strong diffuse overexpression (>90%)  ER/PR negative.  MS stable Her2neu assessment was performed given serous histology and was negative.   CT scan on 09/10/2016 with findings that are very reassuring so  stent removed. 1. Interval resolution of abscess involving the vaginal cuff. 2. The proximal end of the right ureteral stent is coiled in the proximal right ureter. No hydronephrosis. 3. No evidence of metastatic disease or other significant changes. 4. Aortic atherosclerosis.   She was referred to Dr. Janese Banks for adjuvant chemotherapy and completed 3 cycles of carbo/taxol. Notable side effects include neuropathy in hands due to prior chemo in past for Hodgkin's disease and more recent regimen.  Taxol dose was reduced 25% with cycle 3 due to neuropathy.    Whole pelvis radiation with Dr Baruch Gouty completed in August 2018.   Problem List: Patient Active Problem List   Diagnosis Date Noted  . B12 deficiency anemia 07/24/2017  . Normocytic anemia 07/18/2017  . Back pain 07/18/2017  . Chemotherapy-induced peripheral neuropathy ([place]) 12/10/2016  . 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 Endometrial cancer ([place])   . Pelvic adhesive disease   . Lymphedema 07/20/2016  . Chronic venous insufficiency 07/20/2016  . PAD (peripheral artery disease) ([place]) 07/20/2016  . Postmenopausal bleeding 07/14/2016  . Class 1 obesity due to excess calories without serious comorbidity with body mass index (BMI) of 34.0 to 34.9 in adult 05/27/2016  . Bronchiectasis ([employer]) 12/19/2015  . Pre-syncope 12/19/2015  . Hx of colonic polyps   . Benign neoplasm of sigmoid colon   . Prolapsed, uterovaginal, incomplete 06/24/2014  . Routine general medical examination at a health care facility 12/30/2012  . Obesity (BMI 30-39.9) 12/30/2012  . Hx of multiple pulmonary nodules 12/28/2012  . Plantar fasciitis of right foot 12/28/2012  . Neuropathy due to chemotherapeutic drug ([place]) 09/12/2012  . Hip pain,  bilateral 09/12/2012  . History of Hodgkin's lymphoma 09/12/2012  . Coronary artery disease   . Hyperlipidemia     Past Medical History: Past Medical History:  Diagnosis Date  . Anxiety   . Cervicalgia   . Coronary  artery disease    a. 02/2012 Stress echo: severe anterior wall ischemia;  b. 02/2012 Cath/PCI: LAD 95p (3.0 x 15 Xience EX DES), D1 90ost (PTCA - bifurcational dzs), EF 45% with anterior HK;  b. 02/2013 Ex MV: fixed anterior defect w/ minor reversibility, nl EF-->Med Rx.  . Endometrial cancer ([place])    a. 07/2016 s/p robotic hysterectomy, BSO w/ washings, sentinel node inj, mapping, bx, adhesiolysis.  . Essential hypertension, benign   . Fibrocystic breast disease   . GERD (gastroesophageal reflux disease)   . Gestational hypertension   . Heart murmur   . History of anemia   . History of blood transfusion   . Hodgkin's lymphoma ([place]) 2011   a. s/p radiation and chemo therapy  . Osteoarthritis   . Polycystic ovarian disease     Past Surgical History: Past Surgical History:  Procedure Laterality Date  . ABDOMINAL HYSTERECTOMY    . bladder sling    . CARDIAC CATHETERIZATION  02/2012   [facility] 1 stent place  . CERVICAL POLYPECTOMY    . CHOLECYSTECTOMY  1982  . COLONOSCOPY WITH PROPOFOL N/A 02/05/2015   Procedure: COLONOSCOPY WITH PROPOFOL;  Surgeon: Lucilla Lame, MD;  Location: [facility] ENDOSCOPY;  Service: Endoscopy;  Laterality: N/A;  . CORONARY ANGIOPLASTY  02/2012   left/right s/p balloon  . CYSTOGRAM N/A 08/17/2016   Procedure: CYSTOGRAM;  Surgeon: Hollice Espy, MD;  Location: [facility] ORS;  Service: Urology;  Laterality: N/A;  . CYSTOSCOPY N/A 08/17/2016   Procedure: CYSTOSCOPY EXAM UNDER ANESTHESIA;  Surgeon: Hollice Espy, MD;  Location: [facility] ORS;  Service: Urology;  Laterality: N/A;  . CYSTOSCOPY W/ RETROGRADES Bilateral 08/17/2016   Procedure: CYSTOSCOPY WITH RETROGRADE PYELOGRAM;  Surgeon: Hollice Espy, MD;  Location: [facility] ORS;  Service: Urology;  Laterality: Bilateral;  . CYSTOSCOPY WITH STENT PLACEMENT Right 08/17/2016   Procedure: CYSTOSCOPY WITH STENT PLACEMENT;  Surgeon: Hollice Espy, MD;  Location: [facility] ORS;  Service: Urology;  Laterality: Right;  . heart stent'  2013  . kidney stent  Right 2018  . LYMPH NODE BIOPSY  2011   diagnosis of hodgkins lymphoma  . PELVIC LYMPH NODE DISSECTION N/A 07/29/2016   Procedure: PELVIC/AORTIC LYMPH NODE SAMPLING;  Surgeon: [name], MD;  Location: [facility] ORS;  Service: Gynecology;  Laterality: N/A;  . PORTA CATH INSERTION N/A 09/22/2016   Procedure: Glori Luis Cath Insertion;  Surgeon: [name], MD;  Location: [place] [facility];  Service: Cardiovascular;  Laterality: N/A;  . PORTA CATH REMOVAL N/A 11/17/2016   Procedure: Glori Luis Cath Removal;  Surgeon: [name], MD;  Location: [place] [facility];  Service: Cardiovascular;  Laterality: N/A;  . ROBOTIC ASSISTED TOTAL HYSTERECTOMY WITH BILATERAL SALPINGO OOPHERECTOMY N/A 07/29/2016   Procedure: ROBOTIC ASSISTED TOTAL HYSTERECTOMY WITH BILATERAL SALPINGO OOPHORECTOMY;  Surgeon: [name], MD;  Location: [facility];  Service: Gynecology;  Laterality: N/A;  . SENTINEL NODE BIOPSY N/A 07/29/2016   Procedure: SENTINEL NODE BIOPSY;  Surgeon: [name], MD;  Location: [facility] OR[facility];  Service: Gynecology;  Laterality: N/A;  . transobturator sling N/A 2009   [place]    Family History: Family History  Problem Relation Age of Onset  . ALS Father   . Polymyositis Father   . Diabetes  Brother   . Cancer Maternal Aunt        breast  . Breast cancer Maternal Aunt        30's  . Stroke Maternal Grandmother   . Cancer Maternal Grandfather        prostate  . Stroke Maternal Grandfather     Social History: Works as Dealer Social History   Socioeconomic History  . Marital status: Widowed    Spouse name: Not on file  . Number of children: Not on file  . Years of education: Not on file  . Highest education level: Not on file  Occupational History  . Not on file  Social Needs  . Financial resource strain: Not on file  . Food insecurity:    Worry: Not on file    Inability: Not on file  . Transportation needs:    Medical: Not on file     Non-medical: Not on file  Tobacco Use  . Smoking status: Former Smoker    Packs/day: 1.00    Years: 30.00    Pack years: 30.00    Types: Cigarettes    Last attempt to quit: 07/24/2002    Years since quitting: 15.1  . Smokeless tobacco: Never Used  . Tobacco comment: quit smoking in 2000  Substance and Sexual Activity  . Alcohol use: Yes    Comment: occasional, 1-2 drinks per month  . Drug use: No  . Sexual activity: Never  Lifestyle  . Physical activity:    Days per week: Not on file    Minutes per session: Not on file  . Stress: Not on file  Relationships  . Social connections:    Talks on phone: Not on file    Gets together: Not on file    Attends religious service: Not on file    Active member of club or organization: Not on file    Attends meetings of clubs or organizations: Not on file    Relationship status: Not on file  . Intimate partner violence:    Fear of current or ex partner: Not on file    Emotionally abused: Not on file    Physically abused: Not on file    Forced sexual activity: Not on file  Other Topics Concern  . Not on file  Social History Narrative  . Not on file    Allergies: Allergies  Allergen Reactions  . Adhesive [Tape] Other (See Comments)    Burns the skin  . Z-Pak [Azithromycin] Itching  . Antifungal [Miconazole Nitrate] Rash  . Sulfa Antibiotics Nausea And Vomiting and Rash    N/T  . Tolnaftate Rash    Current Medications: Current Outpatient Medications  Medication Sig Dispense Refill  . aspirin EC 81 MG tablet Take 81 mg by mouth at bedtime.    Marland Kitchen atorvastatin (LIPITOR) 10 MG tablet TAKE ONE TABLET BY MOUTH ONCE DAILY 90 tablet 3  . Cholecalciferol (VITAMIN D3) 2000 units TABS Take 2,000 Units by mouth 3 (three) times a week.    . cyanocobalamin (,VITAMIN B-12,) 1000 MCG/ML injection 1 ml injected IM weekly x 4 ,  Then monthly thereafter 10 mL 2  . diazepam (VALIUM) 5 MG tablet Take 1 tablet (5 mg total) by mouth every 12  (twelve) hours as needed for anxiety. 30 tablet 0  . esomeprazole (NEXIUM) 20 MG capsule Take 20 mg by mouth daily before breakfast.     . ibuprofen (ADVIL,MOTRIN) 200 MG tablet Take 200 mg by mouth every  6 (six) hours as needed for mild pain.     . metoprolol tartrate (LOPRESSOR) 25 MG tablet TAKE ONE TABLET BY MOUTH TWICE DAILY 180 tablet 3  . Syringe/Needle, Disp, (SYRINGE 3CC/25GX1") 25G X 1" 3 ML MISC Use for b12 injections 50 each 0  . Turmeric 500 MG CAPS Take 500 mg by mouth 2 (two) times daily.    Marland Kitchen acetaminophen (TYLENOL) 500 MG tablet Take 1,000 mg by mouth every 6 (six) hours as needed for mild pain.     . capsicum (ZOSTRIX) 0.075 % topical cream Apply 1 application topically 4 (four) times daily. To feet (Patient not taking: Reported on 09/29/2017) 28.3 g 0  . cephALEXin (KEFLEX) 500 MG capsule Take 1 capsule (500 mg total) by mouth 2 (two) times daily. (Patient not taking: Reported on 09/29/2017) 14 capsule 0  . nitroGLYCERIN (NITROSTAT) 0.4 MG SL tablet Place 1 tablet (0.4 mg total) under the tongue every 5 (five) minutes as needed for chest pain. (Patient not taking: Reported on 09/29/2017) 25 tablet 3   No current facility-administered medications for this visit.     Review of Systems General: fatigue  HEENT: no complaints  Lungs: no complaints  Cardiac: no complaints  GI: bowel issues improved and may have been due to food poisoning  GU: bladder function issues; no vaginal bleeding or discharge  Musculoskeletal: back pain  Extremities: no complaints  Skin: no complaints  Neuro: peripheral neuropathy with foot pain  Endocrine: no complaints  Psych: feeling sad     Objective:  Physical Examination:  BP 113/72 (BP Location: Left Arm, Patient Position: Sitting)   Pulse 78   Temp (!) 97.2 F (36.2 C) (Tympanic)   Resp 18   Ht '5\' 1"'  (1.549 m)   Wt 165 lb 8 oz (75.1 kg)   BMI 31.27 kg/m    ECOG Performance Status: 1 - Symptomatic but completely ambulatory  General  appearance: alert, cooperative and appears stated age HEENT:PERRLA, extra ocular movement intact and sclera clear, anicteric  CV: RRR Lungs: BCTA Lymph node survey: non-palpable, axillary, inguinal, supraclavicular.  Abdomen: soft, non-tender, without masses or organomegaly, no hernias and well healed incision Back: inspection of back is normal; nontender to palpation Skin exam - incisions all well healed. Neurological exam reveals alert, oriented, normal speech, no focal findings or movement disorder noted.  Pelvic: deferred based on last exam. exam chaperoned by nurse;  Vulva: normal appearing vulva with no masses, tenderness or lesions; Vagina: normal vagina;  Adnexa/Uterus/Cervix:absent. Bimanual: no masses or tenderness.  Rectal: normal rectal, confirmatory    Assessment:  Jaime Hall is a 67 y.o. 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 Complete resolution of vaginal cuff abscess.  Severe pelvic adhesive disease.  Now s/p 3 cycles of carboplatin/taxol and whole pelvic radiation. Symptoms concerning for recurrence; Imaging and exam  NED today.  Medical co-morbidities complicating care: 30 pack year former smoker s/p coronary angioplasty and stents 2013 and takes baby ASA.  Plan:   Problem List Items Addressed This Visit      Genitourinary   Endometrial cancer ([place]) - Primary   Relevant Orders   NM PET Image Restag (PS) Skull Base To Thigh     I have recommended PET scan for further evaluation. If PET  negative I think her symptoms are due to non-malignant causes. She will follow up with [name] - I discussed consideration of MRI for back pain and referral to Dr. [name] for  bladder issues. She will review with [name].   Previously we discussed -continued close follow up with exams, including pelvic exams every 3-6 months for 2-3 years, then every 6-12 months for 3-5 years and then annually thereafter. She will see Dr. Leonides Schanz in 3 months and then in our clinic in 6 months.  Imaging and laboratory assessment is based on clinical indication. She previously received education for obesity, lifestyle, exercise, nutrition, smoking cessation, sexual health, vaginal lubricants. We discussed her weight and need for weight loss, stressed good nutrition, and exercise.  I previously provided information regarding calorie counting and exercise for weight loss; and offered referral to the CARE program and provided a pamphlet today.    We will continue to follow closely. She will see Dr. Leonides Schanz in 3 months and then return to our clinic in 6 months.   The patient's diagnosis, an outline of the further diagnostic and laboratory studies which will be required, the recommendation, and alternatives were discussed.  All questions were answered to the patient's satisfaction.  [name], MD  CC:  [name], MD [address] [place], [place] [address] [phone]  Larey Days, MD  Hollice Espy, MD

## 2017-09-29 NOTE — Progress Notes (Signed)
No new Gyn changes noted today 

## 2017-10-04 ENCOUNTER — Ambulatory Visit (INDEPENDENT_AMBULATORY_CARE_PROVIDER_SITE_OTHER): Payer: PPO | Admitting: Internal Medicine

## 2017-10-04 ENCOUNTER — Encounter: Payer: Self-pay | Admitting: Internal Medicine

## 2017-10-04 VITALS — BP 112/64 | HR 67 | Temp 98.0°F | Resp 15 | Ht 61.0 in | Wt 162.6 lb

## 2017-10-04 DIAGNOSIS — N182 Chronic kidney disease, stage 2 (mild): Secondary | ICD-10-CM

## 2017-10-04 DIAGNOSIS — R944 Abnormal results of kidney function studies: Secondary | ICD-10-CM

## 2017-10-04 DIAGNOSIS — E559 Vitamin D deficiency, unspecified: Secondary | ICD-10-CM

## 2017-10-04 LAB — BASIC METABOLIC PANEL
BUN: 16 mg/dL (ref 6–23)
CO2: 27 mEq/L (ref 19–32)
Calcium: 9.8 mg/dL (ref 8.4–10.5)
Chloride: 104 mEq/L (ref 96–112)
Creatinine, Ser: 1.15 mg/dL (ref 0.40–1.20)
GFR: 49.87 mL/min — ABNORMAL LOW (ref >60.00)
Glucose, Bld: 97 mg/dL (ref 70–99)
Potassium: 5.2 mEq/L — ABNORMAL HIGH (ref 3.5–5.1)
Sodium: 139 mEq/L (ref 135–145)

## 2017-10-04 LAB — MICROALBUMIN / CREATININE URINE RATIO
Creatinine,U: 258.6 mg/dL
Microalb Creat Ratio: 1 mg/g (ref 0.0–30.0)
Microalb, Ur: 2.6 mg/dL — ABNORMAL HIGH (ref 0.0–1.9)

## 2017-10-04 LAB — VITAMIN D 25 HYDROXY (VIT D DEFICIENCY, FRACTURES): VITD: 59 ng/mL (ref 30.00–100.00)

## 2017-10-04 NOTE — Progress Notes (Signed)
Subjective:  Patient ID: Jaime Hall, female    DOB: November 29, 1950  Age: 67 y.o. MRN: [id number]  CC: The primary encounter diagnosis was Decreased calculated GFR. Diagnoses of Vitamin D deficiency and Chronic kidney disease, stage 2, mildly decreased GFR were also pertinent to this visit.  HPI Jaime Hall presents for follow up on decreased GFR noted by Almyra Free at NP.  Has  stopped taking all meds  Due to news including tylenol nexium b12,  And vitamin D.  Was taking aleve about twice a week, on a regular basis for migraines and back pain Was taking the celebrex without nausea vomiting or rash. finished a 30 day supply   Still taking asa, lipitor and metoprolol    Had persistent diarrhea and right inguinal pain ,  Attributes it to radiation she had for endometrial cancer.  Had CT scan march 29 .  Normal.  Had urethral stent plaed on on right side after hysterectony due to concerns for perforated ureter and recurrent abscess .  Has noted weak stream for the last month    Back pain : persistent . Aggravated by inactivity. Relieved with motion.  PET scan ordered  y Oncology  Has morning gluteal buttock pain,  Squatting and benign extremely painful   Lab Results  Component Value Date   MICROALBUR 2.6 (H) 10/04/2017   Outpatient Medications Prior to Visit  Medication Sig Dispense Refill  . aspirin EC 81 MG tablet Take 81 mg by mouth at bedtime.    Marland Kitchen atorvastatin (LIPITOR) 10 MG tablet TAKE ONE TABLET BY MOUTH ONCE DAILY 90 tablet 3  . metoprolol tartrate (LOPRESSOR) 25 MG tablet TAKE ONE TABLET BY MOUTH TWICE DAILY 180 tablet 3  . acetaminophen (TYLENOL) 500 MG tablet Take 1,000 mg by mouth every 6 (six) hours as needed for mild pain.     . capsicum (ZOSTRIX) 0.075 % topical cream Apply 1 application topically 4 (four) times daily. To feet (Patient not taking: Reported on 09/29/2017) 28.3 g 0  . Cholecalciferol (VITAMIN D3) 2000 units TABS Take 2,000 Units by mouth 3 (three) times a  week.    . cyanocobalamin (,VITAMIN B-12,) 1000 MCG/ML injection 1 ml injected IM weekly x 4 ,  Then monthly thereafter (Patient not taking: Reported on 10/04/2017) 10 mL 2  . diazepam (VALIUM) 5 MG tablet Take 1 tablet (5 mg total) by mouth every 12 (twelve) hours as needed for anxiety. (Patient not taking: Reported on 10/04/2017) 30 tablet 0  . esomeprazole (NEXIUM) 20 MG capsule Take 20 mg by mouth daily before breakfast.     . ibuprofen (ADVIL,MOTRIN) 200 MG tablet Take 200 mg by mouth every 6 (six) hours as needed for mild pain.     . nitroGLYCERIN (NITROSTAT) 0.4 MG SL tablet Place 1 tablet (0.4 mg total) under the tongue every 5 (five) minutes as needed for chest pain. (Patient not taking: Reported on 09/29/2017) 25 tablet 3  . Syringe/Needle, Disp, (SYRINGE 3CC/25GX1") 25G X 1" 3 ML MISC Use for b12 injections (Patient not taking: Reported on 10/04/2017) 50 each 0  . Turmeric 500 MG CAPS Take 500 mg by mouth 2 (two) times daily.    . cephALEXin (KEFLEX) 500 MG capsule Take 1 capsule (500 mg total) by mouth 2 (two) times daily. (Patient not taking: Reported on 09/29/2017) 14 capsule 0   No facility-administered medications prior to visit.     Review of Systems;  Patient denies headache, fevers, malaise, unintentional weight loss, skin  rash, eye pain, sinus congestion and sinus pain, sore throat, dysphagia,  hemoptysis , cough, dyspnea, wheezing, chest pain, palpitations, orthopnea, edema, abdominal pain, nausea, melena, diarrhea, constipation, flank pain, dysuria, hematuria, urinary  Frequency, nocturia, numbness, tingling, seizures,  Focal weakness, Loss of consciousness,  Tremor, insomnia, depression, anxiety, and suicidal ideation.      Objective:  BP 112/64 (BP Location: Left Arm, Patient Position: Sitting, Cuff Size: Normal)   Pulse 67   Temp 98 F (36.7 C) (Oral)   Resp 15   Ht 5\' 1"  (1.549 m)   Wt 162 lb 9.6 oz (73.8 kg)   SpO2 100%   BMI 30.72 kg/m   BP Readings from Last 3  Encounters:  10/04/17 112/64  09/29/17 113/72  09/23/17 130/70    Wt Readings from Last 3 Encounters:  10/04/17 162 lb 9.6 oz (73.8 kg)  09/29/17 165 lb 8 oz (75.1 kg)  09/23/17 166 lb 2 oz (75.4 kg)    General appearance: alert, cooperative and appears stated age Ears: normal TM's and external ear canals both ears Throat: lips, mucosa, and tongue normal; teeth and gums normal Neck: no adenopathy, no carotid bruit, supple, symmetrical, trachea midline and thyroid not enlarged, symmetric, no tenderness/mass/nodules Back: symmetric, no curvature. ROM normal. No CVA tenderness. Lungs: clear to auscultation bilaterally Heart: regular rate and rhythm, S1, S2 normal, no murmur, click, rub or gallop Abdomen: soft, non-tender; bowel sounds normal; no masses,  no organomegaly Pulses: 2+ and symmetric Skin: Skin color, texture, turgor normal. No rashes or lesions Lymph nodes: Cervical, supraclavicular, and axillary nodes normal.  Lab Results  Component Value Date   HGBA1C 5.8 05/26/2016    Lab Results  Component Value Date   CREATININE 1.15 10/04/2017   CREATININE 1.51 (H) 09/23/2017   CREATININE 1.70 (H) 08/30/2017    Lab Results  Component Value Date   WBC 6.4 06/10/2017   HGB 11.7 (L) 06/10/2017   HCT 34.7 (L) 06/10/2017   PLT 243 06/10/2017   GLUCOSE 97 10/04/2017   CHOL 109 07/16/2017   TRIG 128.0 07/16/2017   HDL 45.10 07/16/2017   LDLCALC 38 07/16/2017   ALT 15 06/10/2017   AST 20 06/10/2017   NA 139 10/04/2017   K 5.2 (H) 10/04/2017   CL 104 10/04/2017   CREATININE 1.15 10/04/2017   BUN 16 10/04/2017   CO2 27 10/04/2017   TSH 3.92 05/26/2016   INR 1.02 08/05/2016   HGBA1C 5.8 05/26/2016   MICROALBUR 2.6 (H) 10/04/2017    No results found.  Assessment & Plan:   Problem List Items Addressed This Visit    Chronic kidney disease, stage 2, mildly decreased GFR    Noted over the past 3 moths.  NSAIDs stopped and renal function has returned to normal  Lab  Results  Component Value Date   CREATININE 1.15 10/04/2017          Other Visit Diagnoses    Decreased calculated GFR    -  Primary   Relevant Orders   Basic metabolic panel (Completed)   Microalbumin / creatinine urine ratio (Completed)   Vitamin D deficiency       Relevant Orders   VITAMIN D 25 Hydroxy (Vit-D Deficiency, Fractures) (Completed)      I have discontinued Jaime Hall's cephALEXin. I am also having her maintain her nitroGLYCERIN, esomeprazole, aspirin EC, Turmeric, Vitamin D3, acetaminophen, ibuprofen, metoprolol tartrate, atorvastatin, capsicum, diazepam, cyanocobalamin, and SYRINGE 3CC/25GX1".  No orders of the defined types were placed  in this encounter.   Medications Discontinued During This Encounter  Medication Reason  . cephALEXin (KEFLEX) 500 MG capsule Completed Course    Follow-up: No follow-ups on file.   [name], MD

## 2017-10-04 NOTE — Patient Instructions (Signed)
Your kidney function may be deen down due to medications (celeberex, alevel,  Etc)  If it is back to normal today,  No further workup is needed  If not,  Referral to [facility] is planned

## 2017-10-05 DIAGNOSIS — N182 Chronic kidney disease, stage 2 (mild): Secondary | ICD-10-CM | POA: Insufficient documentation

## 2017-10-05 NOTE — Assessment & Plan Note (Signed)
Noted over the past 3 moths.  NSAIDs stopped and renal function has returned to normal  Lab Results  Component Value Date   CREATININE 1.15 10/04/2017

## 2017-10-06 ENCOUNTER — Telehealth: Payer: Self-pay

## 2017-10-06 ENCOUNTER — Other Ambulatory Visit: Payer: Self-pay | Admitting: Nurse Practitioner

## 2017-10-06 ENCOUNTER — Ambulatory Visit
Admission: RE | Admit: 2017-10-06 | Discharge: 2017-10-06 | Disposition: A | Discharge status: Home / Self Care | Payer: PPO | Source: Ambulatory Visit | Attending: Nurse Practitioner | Admitting: Nurse Practitioner

## 2017-10-06 DIAGNOSIS — I700 Atherosclerosis of aorta: Secondary | ICD-10-CM | POA: Insufficient documentation

## 2017-10-06 DIAGNOSIS — M8448XA Pathological fracture, other site, initial encounter for fracture: Secondary | ICD-10-CM | POA: Diagnosis not present

## 2017-10-06 DIAGNOSIS — I517 Cardiomegaly: Secondary | ICD-10-CM | POA: Insufficient documentation

## 2017-10-06 DIAGNOSIS — C541 Malignant neoplasm of endometrium: Secondary | ICD-10-CM | POA: Diagnosis not present

## 2017-10-06 DIAGNOSIS — I2510 Atherosclerotic heart disease of native coronary artery without angina pectoris: Secondary | ICD-10-CM | POA: Diagnosis not present

## 2017-10-06 DIAGNOSIS — K5730 Diverticulosis of large intestine without perforation or abscess without bleeding: Secondary | ICD-10-CM | POA: Insufficient documentation

## 2017-10-06 LAB — GLUCOSE, CAPILLARY: Glucose-Capillary: 95 mg/dL (ref 65–99)

## 2017-10-06 MED ORDER — FLUDEOXYGLUCOSE F - 18 (FDG) INJECTION
8.6900 | Freq: Once | INTRAVENOUS | Status: AC | PRN
  Administered 2017-10-06: 8.69 via INTRAVENOUS

## 2017-10-06 NOTE — Telephone Encounter (Signed)
Voicemail left with Jaime Hall to return call for PET results. Oncology Nurse Navigator Documentation  Navigator Location: [facility] (10/06/17 1500)   )Navigator Encounter Type: Telephone;Diagnostic Results (10/06/17 1500)                     Patient Visit Type: GynOnc (10/06/17 1500)                              Time Spent with Patient: 15 (10/06/17 1500)

## 2017-10-06 NOTE — Telephone Encounter (Signed)
Called and notified of PET results. Dr. [name] recommends orthopedic referral. Jaime Hall will call me back with orthopedic provider preference.  IMPRESSION: 1. No findings of active malignancy in the neck, chest, abdomen, or pelvis. 2. Subacute sacral insufficiency fracture with transverse component at S1-2 and vertical components in both sacral ala. There is also a nondisplaced subacute fracture of the right L5 transverse process. Low-grade associated metabolic activity favors a benign etiology. 3. Other imaging findings of potential clinical significance: Aortic Atherosclerosis (ICD10-I70.0). Coronary atherosclerosis. Mild cardiomegaly. Stable paramediastinal fibrosis. Sigmoid diverticulosis.    Oncology Nurse Navigator Documentation  Navigator Location: [facility] (10/06/17 1700)   )Navigator Encounter Type: Telephone;Diagnostic Results (10/06/17 1700)                     Patient Visit Type: GynOnc (10/06/17 1700)                              Time Spent with Patient: 15 (10/06/17 1700)

## 2017-10-07 ENCOUNTER — Telehealth: Payer: Self-pay

## 2017-10-07 NOTE — Telephone Encounter (Signed)
Spoke with Jaime Hall. She would like referral sent to Dr. Kristeen Miss at [facility] in [place]. Beckey Rutter, NP notified. Oncology Nurse Navigator Documentation  Navigator Location: [facility] (10/07/17 1000)   )Navigator Encounter Type: Telephone (10/07/17 1000) Telephone: Incoming Call (10/07/17 1000)                   Patient Visit Type: GynOnc (10/07/17 1000)                              Time Spent with Patient: 15 (10/07/17 1000)

## 2017-10-08 ENCOUNTER — Other Ambulatory Visit: Payer: Self-pay | Admitting: Nurse Practitioner

## 2017-10-08 DIAGNOSIS — M8448XA Pathological fracture, other site, initial encounter for fracture: Secondary | ICD-10-CM

## 2017-10-08 NOTE — Progress Notes (Signed)
Referral to Dr. Ellene Route placed for consultation regarding sacral insufficiency fracture and fracture of L5 transverse process.

## 2017-10-11 ENCOUNTER — Telehealth: Payer: Self-pay | Admitting: *Deleted

## 2017-10-11 NOTE — Progress Notes (Signed)
Referral faxed with confirmation of receipt to [facility]. Attention: New patient Coordinator. Records requested sent with referral. Oncology Nurse Navigator Documentation  Navigator Location: [facility] (10/11/17 1400)   )Navigator Encounter Type: Letter/Fax/Email (10/11/17 1400)                     Patient Visit Type: GynOnc (10/11/17 1400)       Interventions: Referrals (10/11/17 1400) Referrals: Other (10/11/17 1400)                    Time Spent with Patient: 30 (10/11/17 1400)

## 2017-10-11 NOTE — Telephone Encounter (Signed)
-----   Message from Wilburn Cornelia sent at 10/07/2017  8:34 AM EDT ----- Regarding: called and asked for you Contact: [phone] Callback?

## 2017-10-11 NOTE — Telephone Encounter (Signed)
Beverlee Nims send me an inbasket 4/25 and I was off work on 4/25 and 4/26 and I returned the phone call today and got pt's voicemail. I asked her to call me back here in [place] if it is before 4:30 today and left number and otherwise I will not be back to work until wed 5/1.

## 2017-10-14 ENCOUNTER — Telehealth: Payer: Self-pay

## 2017-10-14 ENCOUNTER — Other Ambulatory Visit: Payer: Self-pay | Admitting: Internal Medicine

## 2017-10-14 NOTE — Telephone Encounter (Signed)
Jaime Hall has been scheduled with Dr. Ellene Route 11/24/17 at 1430. His office notified her of appointment. Oncology Nurse Navigator Documentation  Navigator Location: [facility] (10/14/17 1600)   )Navigator Encounter Type: Letter/Fax/Email (10/14/17 1600) Telephone: Appt Confirmation/Clarification (10/14/17 1600)                   Patient Visit Type: GynOnc (10/14/17 1600)                              Time Spent with Patient: 15 (10/14/17 1600)

## 2017-10-15 MED ORDER — DIAZEPAM 5 MG PO TABS: 5.0000 mg | ORAL_TABLET | Freq: Two times a day (BID) | ORAL | 0 refills | Status: DC | PRN

## 2017-10-15 NOTE — Telephone Encounter (Signed)
Last refill 30 1/19 last OV 10/04/17 ok to fill?

## 2017-11-10 ENCOUNTER — Ambulatory Visit (INDEPENDENT_AMBULATORY_CARE_PROVIDER_SITE_OTHER): Payer: PPO

## 2017-11-10 VITALS — BP 122/76 | HR 77 | Temp 98.2°F | Resp 14 | Ht 61.0 in | Wt 163.8 lb

## 2017-11-10 DIAGNOSIS — Z0000 Encounter for general adult medical examination without abnormal findings: Secondary | ICD-10-CM | POA: Diagnosis not present

## 2017-11-10 DIAGNOSIS — R944 Abnormal results of kidney function studies: Secondary | ICD-10-CM | POA: Diagnosis not present

## 2017-11-10 NOTE — Progress Notes (Signed)
Subjective:   Jaime Hall is a 67 y.o. female who presents for Medicare Annual (Subsequent) preventive examination.  Review of Systems:  No ROS.  Medicare Wellness Visit. Additional risk factors are reflected in the social history. Cardiac Risk Factors include: advanced age (>86men, >61 women);obesity (BMI >30kg/m2)     Objective:     Vitals: BP 122/76 (BP Location: Left Arm, Patient Position: Sitting, Cuff Size: Normal)   Pulse 77   Temp 98.2 F (36.8 C) (Oral)   Resp 14   Ht 5\' 1"  (1.549 m)   Wt 163 lb 12.8 oz (74.3 kg)   SpO2 97%   BMI 30.95 kg/m   Body mass index is 30.95 kg/m.  Advanced Directives 11/10/2017 09/29/2017 09/06/2017 08/19/2017 04/08/2017 03/24/2017 03/11/2017  Does Patient Have a Medical Advance Directive? No No No No No No No  Does patient want to make changes to medical advance directive? - - - - - - -  Would patient like information on creating a medical advance directive? No - Patient declined No - Patient declined No - Patient declined No - Patient declined No - Patient declined No - Patient declined Yes (MAU/Ambulatory/Procedural Areas - Information given)    Tobacco Social History   Tobacco Use  Smoking Status Former Smoker  . Packs/day: 1.00  . Years: 30.00  . Pack years: 30.00  . Types: Cigarettes  . Last attempt to quit: 07/24/2002  . Years since quitting: 15.3  Smokeless Tobacco Never Used  Tobacco Comment   quit smoking in 2000     Counseling given: Not Answered Comment: quit smoking in 2000   Clinical Intake:  Pre-visit preparation completed: Yes  Pain : No/denies pain     Nutritional Status: BMI > 30  Obese Diabetes: No  How often do you need to have someone help you when you read instructions, pamphlets, or other written materials from your doctor or pharmacy?: 1 - Never  Interpreter Needed?: No     Past Medical History:  Diagnosis Date  . Anxiety   . Cervicalgia   . Coronary artery disease    a. 02/2012 Stress  echo: severe anterior wall ischemia;  b. 02/2012 Cath/PCI: LAD 95p (3.0 x 15 Xience EX DES), D1 90ost (PTCA - bifurcational dzs), EF 45% with anterior HK;  b. 02/2013 Ex MV: fixed anterior defect w/ minor reversibility, nl EF-->Med Rx.  . Endometrial cancer ([place])    a. 07/2016 s/p robotic hysterectomy, BSO w/ washings, sentinel node inj, mapping, bx, adhesiolysis.  . Essential hypertension, benign   . Fibrocystic breast disease   . GERD (gastroesophageal reflux disease)   . Gestational hypertension   . Heart murmur   . History of anemia   . History of blood transfusion   . Hodgkin's lymphoma ([place]) 2011   a. s/p radiation and chemo therapy  . Osteoarthritis   . Polycystic ovarian disease    Past Surgical History:  Procedure Laterality Date  . ABDOMINAL HYSTERECTOMY    . bladder sling    . CARDIAC CATHETERIZATION  02/2012   [facility] 1 stent place  . CERVICAL POLYPECTOMY    . CHOLECYSTECTOMY  1982  . COLONOSCOPY WITH PROPOFOL N/A 02/05/2015   Procedure: COLONOSCOPY WITH PROPOFOL;  Surgeon: Lucilla Lame, MD;  Location: [facility] ENDOSCOPY;  Service: Endoscopy;  Laterality: N/A;  . CORONARY ANGIOPLASTY  02/2012   left/right s/p balloon  . CYSTOGRAM N/A 08/17/2016   Procedure: CYSTOGRAM;  Surgeon: Hollice Espy, MD;  Location: [facility] ORS;  Service:  Urology;  Laterality: N/A;  . CYSTOSCOPY N/A 08/17/2016   Procedure: CYSTOSCOPY EXAM UNDER ANESTHESIA;  Surgeon: Hollice Espy, MD;  Location: [facility] ORS;  Service: Urology;  Laterality: N/A;  . CYSTOSCOPY W/ RETROGRADES Bilateral 08/17/2016   Procedure: CYSTOSCOPY WITH RETROGRADE PYELOGRAM;  Surgeon: Hollice Espy, MD;  Location: [facility] ORS;  Service: Urology;  Laterality: Bilateral;  . CYSTOSCOPY WITH STENT PLACEMENT Right 08/17/2016   Procedure: CYSTOSCOPY WITH STENT PLACEMENT;  Surgeon: Hollice Espy, MD;  Location: [facility] ORS;  Service: Urology;  Laterality: Right;  . heart stent'  2013  . kidney stent Right 2018  . LYMPH NODE BIOPSY  2011   diagnosis of hodgkins  lymphoma  . PELVIC LYMPH NODE DISSECTION N/A 07/29/2016   Procedure: PELVIC/AORTIC LYMPH NODE SAMPLING;  Surgeon: [name], MD;  Location: [facility] ORS;  Service: Gynecology;  Laterality: N/A;  . PORTA CATH INSERTION N/A 09/22/2016   Procedure: Glori Luis Cath Insertion;  Surgeon: [name], MD;  Location: [place] [facility];  Service: Cardiovascular;  Laterality: N/A;  . PORTA CATH REMOVAL N/A 11/17/2016   Procedure: Glori Luis Cath Removal;  Surgeon: [name], MD;  Location: [place] [facility];  Service: Cardiovascular;  Laterality: N/A;  . ROBOTIC ASSISTED TOTAL HYSTERECTOMY WITH BILATERAL SALPINGO OOPHERECTOMY N/A 07/29/2016   Procedure: ROBOTIC ASSISTED TOTAL HYSTERECTOMY WITH BILATERAL SALPINGO OOPHORECTOMY;  Surgeon: [name], MD;  Location: [facility] ORS;  Service: Gynecology;  Laterality: N/A;  . SENTINEL NODE BIOPSY N/A 07/29/2016   Procedure: SENTINEL NODE BIOPSY;  Surgeon: [name], MD;  Location: [facility] ORS;  Service: Gynecology;  Laterality: N/A;  . transobturator sling N/A 2009   [place]   Family History  Problem Relation Age of Onset  . ALS Father   . Polymyositis Father   . Diabetes Brother   . Cancer Maternal Aunt        breast  . Breast cancer Maternal Aunt        30's  . Stroke Maternal Grandmother   . Cancer Maternal Grandfather        prostate  . Stroke Maternal Grandfather   . Colon cancer Maternal Aunt   . Non-Hodgkin's lymphoma Cousin    Social History   Socioeconomic History  . Marital status: Widowed    Spouse name: Not on file  . Number of children: Not on file  . Years of education: Not on file  . Highest education level: Not on file  Occupational History  . Not on file  Social Needs  . Financial resource strain: Not hard at all  . Food insecurity:    Worry: Never true    Inability: Never true  . Transportation needs:    Medical: No    Non-medical: No  Tobacco Use  . Smoking status: Former Smoker     Packs/day: 1.00    Years: 30.00    Pack years: 30.00    Types: Cigarettes    Last attempt to quit: 07/24/2002    Years since quitting: 15.3  . Smokeless tobacco: Never Used  . Tobacco comment: quit smoking in 2000  Substance and Sexual Activity  . Alcohol use: Yes    Comment: occasional, 1-2 drinks per month  . Drug use: No  . Sexual activity: Never  Lifestyle  . Physical activity:    Days per week: Not on file    Minutes per session: Not on file  . Stress: Only a little  Relationships  . Social connections:    Talks  on phone: Not on file    Gets together: Not on file    Attends religious service: Not on file    Active member of club or organization: Not on file    Attends meetings of clubs or organizations: Not on file    Relationship status: Not on file  Other Topics Concern  . 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     Outpatient Encounter Medications as of 11/10/2017  Medication Sig  . acetaminophen (TYLENOL) 500 MG tablet Take 1,000 mg by mouth every 6 (six) hours as needed for mild pain.   Marland Kitchen aspirin EC 81 MG tablet Take 81 mg by mouth at bedtime.  Marland Kitchen atorvastatin (LIPITOR) 10 MG tablet TAKE ONE TABLET BY MOUTH ONCE DAILY  . capsicum (ZOSTRIX) 0.075 % topical cream Apply 1 application topically 4 (four) times daily. To feet  . Cholecalciferol (VITAMIN D3) 2000 units TABS Take 2,000 Units by mouth 3 (three) times a week.  . cyanocobalamin (,VITAMIN B-12,) 1000 MCG/ML injection 1 ml injected IM weekly x 4 ,  Then monthly thereafter  . diazepam (VALIUM) 5 MG tablet Take 1 tablet (5 mg total) by mouth every 12 (twelve) hours as needed for anxiety.  Marland Kitchen esomeprazole (NEXIUM) 20 MG capsule Take 20 mg by mouth daily before breakfast.   . metoprolol tartrate (LOPRESSOR) 25 MG tablet TAKE ONE TABLET BY MOUTH TWICE DAILY  . nitroGLYCERIN (NITROSTAT) 0.4 MG SL tablet Place 1 tablet (0.4 mg total)  under the tongue every 5 (five) minutes as needed for chest pain.  . Syringe/Needle, Disp, (SYRINGE 3CC/25GX1") 25G X 1" 3 ML MISC Use for b12 injections  . Turmeric 500 MG CAPS Take 500 mg by mouth 2 (two) times daily.   No facility-administered encounter medications on file as of 11/10/2017.     Activities of Daily Living In your present state of health, do you have any difficulty performing the following activities: 11/10/2017  Hearing? N  Vision? N  Difficulty concentrating or making decisions? N  Walking or climbing stairs? N  Dressing or bathing? N  Doing errands, shopping? N  Preparing Food and eating ? N  Using the Toilet? N  Managing your Medications? N  Managing your Finances? N  Housekeeping or managing your Housekeeping? N  Some recent data might be hidden    Patient Care Team: [name], MD as PCP - General (Internal Medicine) [name], MD as Consulting Physician (General Surgery) Mellody Drown, MD as Referring Physician (Obstetrics and Gynecology) [name], MD as Referring Physician (Obstetrics and Gynecology) Hollice Espy, MD as Consulting Physician (Urology) [name], RN as Registered Nurse Sindy Guadeloupe, MD as Consulting Physician (Oncology)    Assessment:   This is a routine wellness examination for Lynett.  The goal of the wellness visit is to assist the patient how to close the gaps in care and create a preventative care plan for the patient.   The roster of all physicians providing medical care to patient is listed in the Snapshot section of the chart.  Taking calcium VIT D as appropriate/Osteoporosis reviewed.   Prolia injections currently administered every 6 months.  Safety issues reviewed; Smoke and carbon monoxide detectors in the home. No firearms in the home. Wears seatbelts when driving or riding with others. No violence in the home.  They do not have excessive sun  exposure.  Discussed the  need for sun protection: hats, long sleeves and the use of sunscreen if there is significant sun exposure.  Patient is alert, normal appearance, oriented to person/place/and time.  Correctly identified the president of the [place] and recalls of 3/3 words. Performs simple calculations and can read correct time from watch face. Displays appropriate judgement.  No new identified risk were noted.  No failures at ADL's or IADL's.   BMI- discussed the importance of a healthy diet, water intake and the benefits of aerobic exercise. Educational material provided.   24 hour diet recall: Regular diet; portion controlled  Dental- UTD  Lab today.   Eye- Visual acuity not assessed per patient preference since they have regular follow up with the ophthalmologist.  Wears corrective lenses.  Sleep patterns- Sleeps without issues.  PNA 23 declined.   Patient Concerns: None at this time. Follow up with PCP as needed.  Exercise Activities and Dietary recommendations Current Exercise Habits: The patient does not participate in regular exercise at present  Goals    . Increase physical activity     Stretch Yoga as tolerated       Fall Risk Fall Risk  11/10/2017 08/19/2017 02/22/2017 11/06/2016 10/22/2016  Falls in the past year? No No No No No   Depression Screen PHQ 2/9 Scores 11/10/2017 08/19/2017 02/22/2017 11/06/2016  PHQ - 2 Score 0 0 0 0  PHQ- 9 Score - - - 0     Cognitive Function MMSE - Mini Mental State Exam 11/10/2017 11/06/2016 11/07/2015  Orientation to time 5 5 5   Orientation to Place 5 5 5   Registration 3 3 3   Attention/ Calculation 5 5 5   Recall 3 3 3   Language- name 2 objects 2 2 2   Language- repeat 1 1 1   Language- follow 3 step command 3 3 3   Language- read & follow direction 1 1 1   Write a sentence 1 1 1   Copy design 1 1 1   Total score 30 30 30         Immunization History  Administered Date(s) Administered  . Pneumococcal Conjugate-13 04/29/2015  . Tdap 10/05/2011    Screening Tests Health Maintenance  Topic Date Due  . PNA vac Low Risk Adult (2 of 2 - PPSV23) 04/28/2016  . INFLUENZA VACCINE  06/14/2018 (Originally 01/13/2018)  . MAMMOGRAM  12/25/2017  . COLONOSCOPY  02/05/2020  . TETANUS/TDAP  10/04/2021  . DEXA SCAN  Completed  . Hepatitis C Screening  Completed      Plan:   End of life planning; Advanced aging; Advanced directives discussed.  No HCPOA/Living Will.  Additional information declined at this time.  I have personally reviewed and noted the following in the patient's chart:   . Medical and social history . Use of alcohol, tobacco or illicit drugs  . Current medications and supplements . Functional ability and status . Nutritional status . Physical activity . Advanced directives . List of other physicians . Hospitalizations, surgeries, and ER visits in previous 12 months . Vitals . Screenings to include cognitive, depression, and falls . Referrals and appointments  In addition, I have reviewed and discussed with patient certain preventive protocols, quality metrics, and best practice recommendations. A written personalized care plan for preventive services as well as general preventive health recommendations were provided to patient.     Varney Biles, LPN  [id number]

## 2017-11-10 NOTE — Patient Instructions (Addendum)
  Jaime Hall , Thank you for taking time to come for your Medicare Wellness Visit. I appreciate your ongoing commitment to your health goals. Please review the following plan we discussed and let me know if I can assist you in the future.   Lab  Follow up as needed.    Have a great day!  These are the goals we discussed: Goals    . Increase physical activity     Stretch Yoga as tolerated       This is a list of the screening recommended for you and due dates:  Health Maintenance  Topic Date Due  . Pneumonia vaccines (2 of 2 - PPSV23) 04/28/2016  . Flu Shot  06/14/2018*  . Mammogram  12/25/2017  . Colon Cancer Screening  02/05/2020  . Tetanus Vaccine  10/04/2021  . DEXA scan (bone density measurement)  Completed  .  Hepatitis C: One time screening is recommended by Center for Disease Control  (CDC) for  adults born from [birth year] through 1966.   Completed  *Topic was postponed. The date shown is not the original due date.

## 2017-11-11 LAB — BASIC METABOLIC PANEL
BUN: 24 mg/dL — ABNORMAL HIGH (ref 6–23)
CO2: 27 mEq/L (ref 19–32)
Calcium: 9.5 mg/dL (ref 8.4–10.5)
Chloride: 106 mEq/L (ref 96–112)
Creatinine, Ser: 1.47 mg/dL — ABNORMAL HIGH (ref 0.40–1.20)
GFR: 37.56 mL/min — ABNORMAL LOW (ref >60.00)
Glucose, Bld: 77 mg/dL (ref 70–99)
Potassium: 5 mEq/L (ref 3.5–5.1)
Sodium: 141 mEq/L (ref 135–145)

## 2017-11-14 ENCOUNTER — Telehealth: Payer: Self-pay | Admitting: Internal Medicine

## 2017-11-14 DIAGNOSIS — N182 Chronic kidney disease, stage 2 (mild): Secondary | ICD-10-CM

## 2017-11-14 NOTE — Telephone Encounter (Signed)
We have ben monitoring her kidney function closely over the last 2 months.  Her potassium is back to normal, and it appears she has mild renal disease based on the fluctuation of her Creatine  . She should be avoiding NSAIDS. If she is not ,  Remind her.  If she has been,  I would like her to see a nephrologist

## 2017-11-15 NOTE — Telephone Encounter (Signed)
Spoke with pt and informed her of her lab results. Pt stated that she is only taking her blood pressure medication, lipitor, an 81mg  aspirin because of a stint that was placed, and turmeric. Pt said she would like for you to refer her to someone you recommend.

## 2017-11-16 NOTE — Telephone Encounter (Signed)
Referral is in process as recommended to nephrology for evaluation of kidney function

## 2017-11-23 ENCOUNTER — Telehealth: Payer: Self-pay

## 2017-11-23 NOTE — Telephone Encounter (Signed)
Received voicemail with question regarding back appointment ans PET scan. Returned call and left voicemail. Oncology Nurse Navigator Documentation  Navigator Location: [facility] (11/23/17 0900)   )Navigator Encounter Type: Telephone (11/23/17 0900)                     Patient Visit Type: GynOnc (11/23/17 0900)                              Time Spent with Patient: 15 (11/23/17 0900)

## 2017-11-23 NOTE — Telephone Encounter (Signed)
CT and PET provided on CD for her to take with her to referral for back pain. Oncology Nurse Navigator Documentation  Navigator Location: [facility] (11/23/17 1400)   )Navigator Encounter Type: Telephone;Diagnostic Results (11/23/17 1400)                                                    Time Spent with Patient: 15 (11/23/17 1400)

## 2017-11-24 DIAGNOSIS — M8448XG Pathological fracture, other site, subsequent encounter for fracture with delayed healing: Secondary | ICD-10-CM | POA: Diagnosis not present

## 2017-11-29 ENCOUNTER — Encounter: Payer: Self-pay | Admitting: Internal Medicine

## 2017-11-29 ENCOUNTER — Telehealth: Payer: Self-pay

## 2017-11-29 NOTE — Telephone Encounter (Signed)
Note from [facility] sent to medical records for filing Oncology Nurse Navigator Documentation  Navigator Location: [facility] (11/29/17 1400)   )Navigator Encounter Type: Letter/Fax/Email (11/29/17 1400)                                                    Time Spent with Patient: 15 (11/29/17 1400)

## 2017-12-21 DIAGNOSIS — C541 Malignant neoplasm of endometrium: Secondary | ICD-10-CM | POA: Diagnosis not present

## 2017-12-22 IMAGING — US US PELVIS COMPLETE
1 series · 13 of 25 positions shown · non-contrast
Comparison: Abdominal and pelvic CT scan September 10, 2010.

CLINICAL DATA: Postmenopausal bleeding for the past 2 weeks,
passing clots and possible tissue. History of a previous bladder
Sitz laying surgical procedure. In remission of Hodgkin's lymphoma
for the past 6 years.

EXAM:
TRANSABDOMINAL AND TRANSVAGINAL ULTRASOUND OF PELVIS
TECHNIQUE: Both transabdominal and transvaginal ultrasound examinations of the
pelvis were performed. Transabdominal technique was performed for
global imaging of the pelvis including uterus, ovaries, adnexal
regions, and pelvic cul-de-sac. It was necessary to proceed with
endovaginal exam following the transabdominal exam to visualize the
endometrium and ovaries..

[Series 1: us pelvis complete · 0.20mm/px · 13 of 69 slices shown]
[im 1/69]
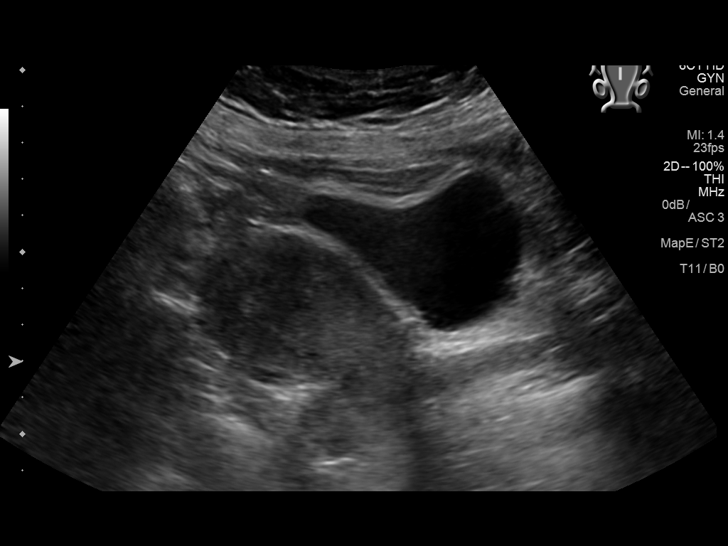
[im 6/69]
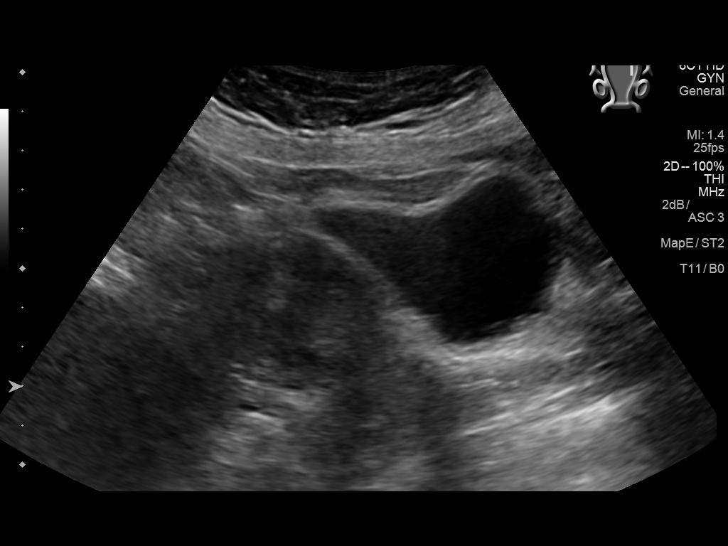
[im 12/69]
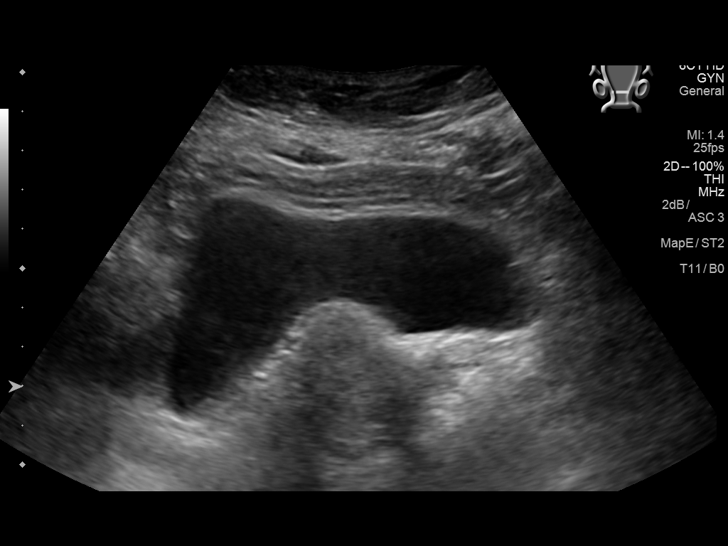
[im 18/69]
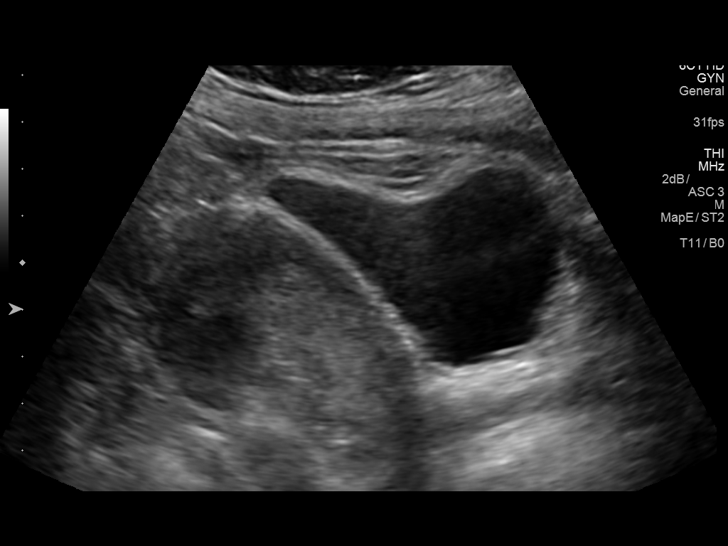
[im 23/69]
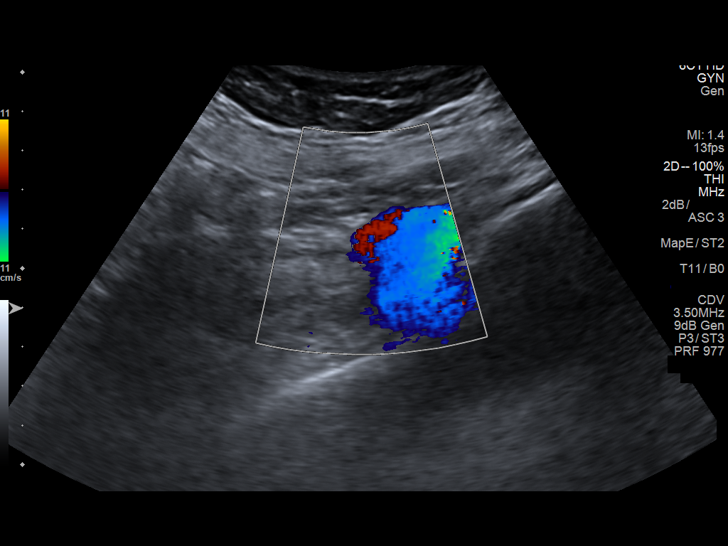
[im 29/69]
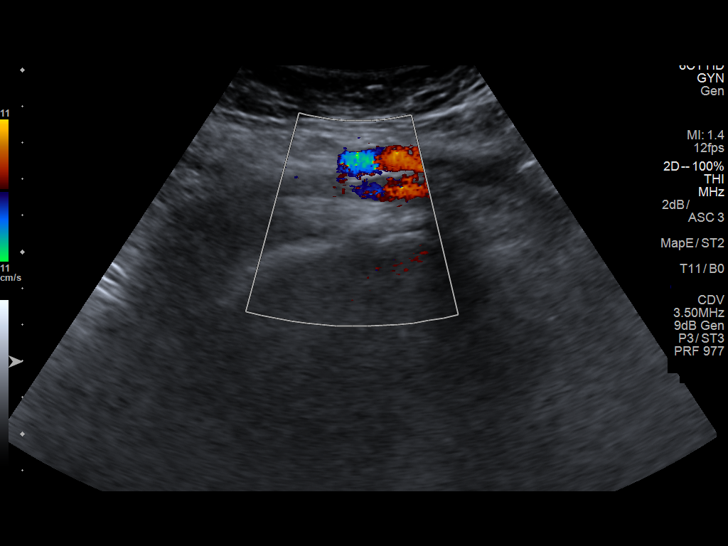
[im 35/69]
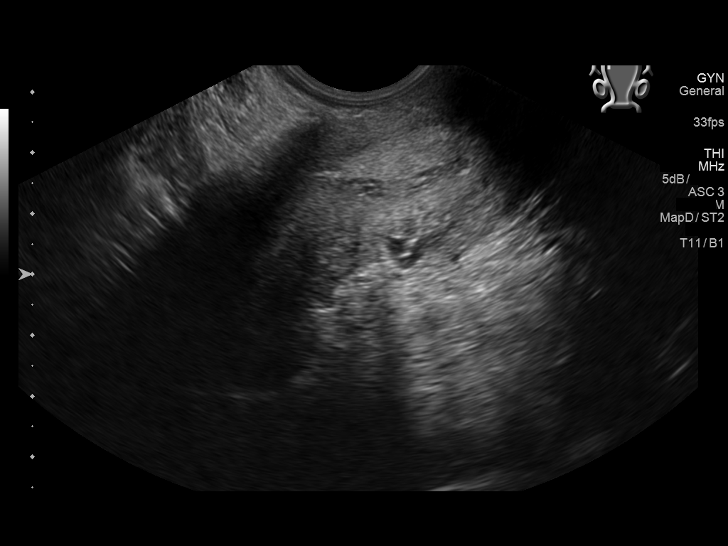
[im 40/69]
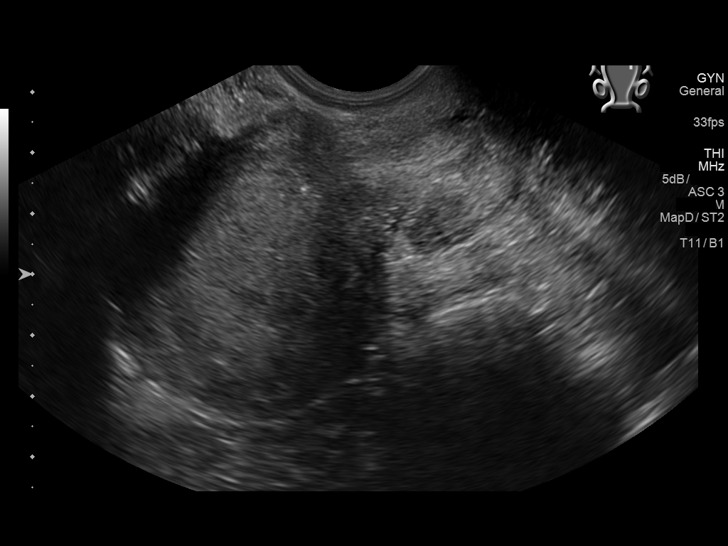
[im 46/69]
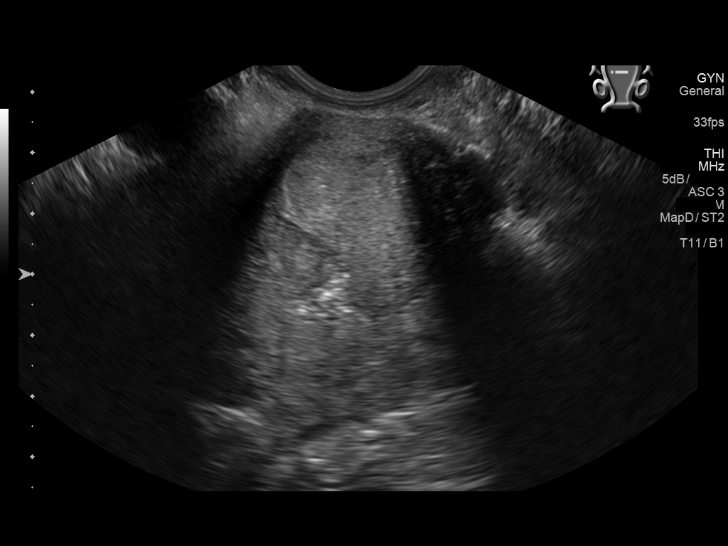
[im 52/69]
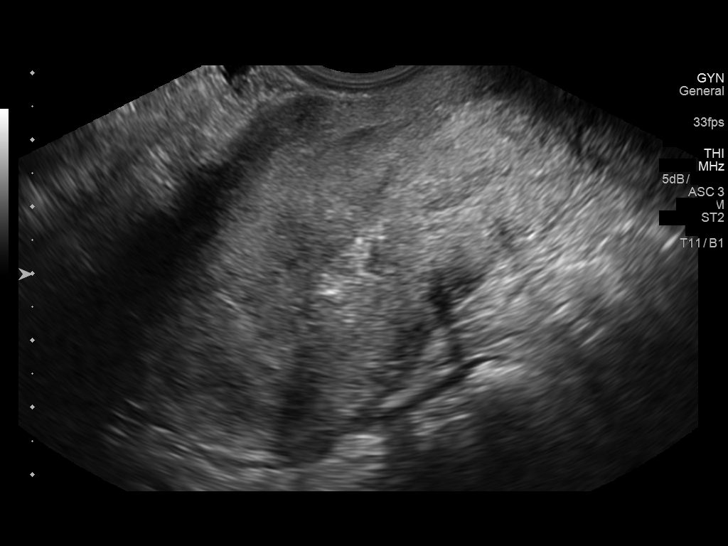
[im 57/69]
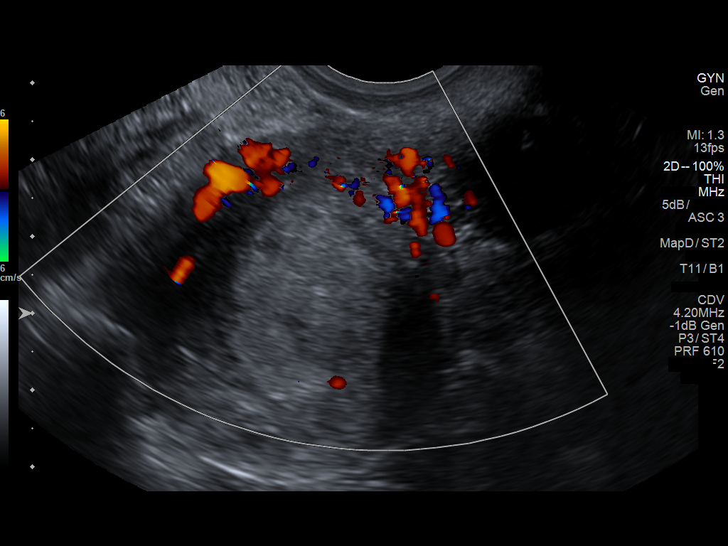
[im 63/69]
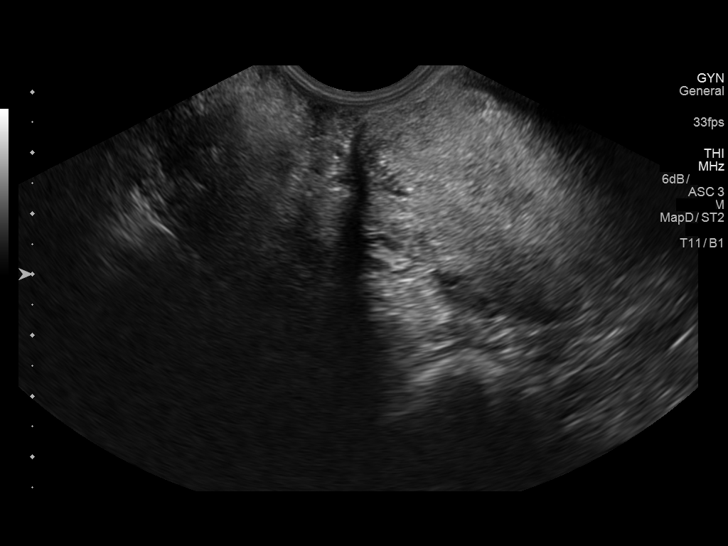
[im 69/69]
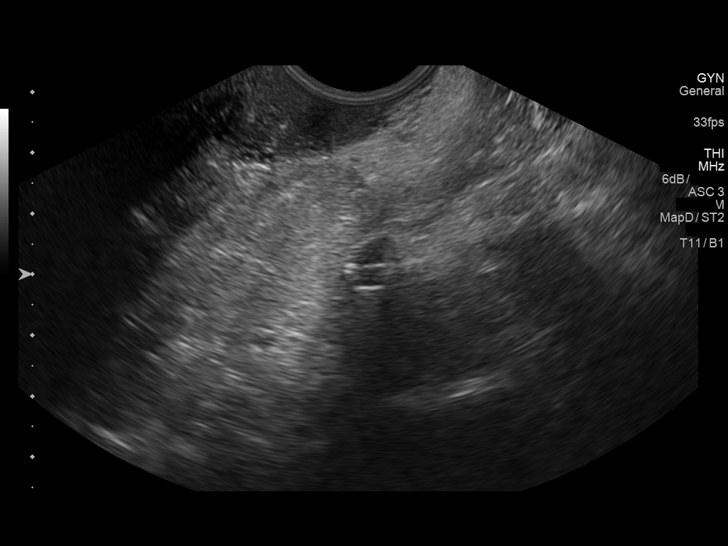

[13 of 25 positions shown; findings below may reference images not displayed]

FINDINGS: Uterus

Measurements: 7.4 x 3.7 x 4.9 cm. No fibroids are observed.

Endometrium

Thickness: 25.8 mm.. Marked diffuse thickening of the endometrium.
There punctate peripheral calcifications.

Right ovary

Measurements: The right ovary could not be visualized..

Left ovary

Measurements: The left ovary could not be visualized.

Other findings

No abnormal free fluid.
IMPRESSION: Markedly thickened endometrium without a discrete mass. In the
setting of post-menopausal bleeding, endometrial sampling is
indicated to exclude carcinoma. If results are benign,
sonohysterogram should be considered for focal lesion work-up. (Ref:
Radiological Reasoning: Algorithmic Workup of Abnormal Vaginal
Bleeding with Endovaginal Sonography and Sonohysterography. AJR
[id number]; 191:S68-73)

Nonvisualization of the ovaries. No adnexal mass observed. No free
pelvic fluid.

## 2017-12-23 ENCOUNTER — Inpatient Hospital Stay: Payer: PPO

## 2017-12-23 ENCOUNTER — Inpatient Hospital Stay: Payer: PPO | Attending: Oncology | Admitting: Oncology

## 2017-12-23 ENCOUNTER — Other Ambulatory Visit: Payer: Self-pay

## 2017-12-23 ENCOUNTER — Encounter: Payer: Self-pay | Admitting: Oncology

## 2017-12-23 VITALS — BP 107/69 | HR 72 | Temp 97.0°F | Resp 16 | Ht 61.0 in | Wt 164.7 lb

## 2017-12-23 DIAGNOSIS — Z8042 Family history of malignant neoplasm of prostate: Secondary | ICD-10-CM | POA: Diagnosis not present

## 2017-12-23 DIAGNOSIS — Z8571 Personal history of Hodgkin lymphoma: Secondary | ICD-10-CM | POA: Diagnosis not present

## 2017-12-23 DIAGNOSIS — Z923 Personal history of irradiation: Secondary | ICD-10-CM

## 2017-12-23 DIAGNOSIS — Z800 Family history of malignant neoplasm of digestive organs: Secondary | ICD-10-CM | POA: Diagnosis not present

## 2017-12-23 DIAGNOSIS — Z8542 Personal history of malignant neoplasm of other parts of uterus: Principal | ICD-10-CM

## 2017-12-23 DIAGNOSIS — G620 Drug-induced polyneuropathy: Secondary | ICD-10-CM | POA: Insufficient documentation

## 2017-12-23 DIAGNOSIS — Z7982 Long term (current) use of aspirin: Secondary | ICD-10-CM | POA: Insufficient documentation

## 2017-12-23 DIAGNOSIS — Z87891 Personal history of nicotine dependence: Secondary | ICD-10-CM | POA: Diagnosis not present

## 2017-12-23 DIAGNOSIS — Z08 Encounter for follow-up examination after completed treatment for malignant neoplasm: Secondary | ICD-10-CM

## 2017-12-23 DIAGNOSIS — C541 Malignant neoplasm of endometrium: Secondary | ICD-10-CM | POA: Diagnosis not present

## 2017-12-23 DIAGNOSIS — Z79899 Other long term (current) drug therapy: Secondary | ICD-10-CM | POA: Diagnosis not present

## 2017-12-23 DIAGNOSIS — Z803 Family history of malignant neoplasm of breast: Secondary | ICD-10-CM | POA: Insufficient documentation

## 2017-12-23 DIAGNOSIS — Z9221 Personal history of antineoplastic chemotherapy: Secondary | ICD-10-CM | POA: Insufficient documentation

## 2017-12-23 DIAGNOSIS — F419 Anxiety disorder, unspecified: Secondary | ICD-10-CM | POA: Diagnosis not present

## 2017-12-23 LAB — CBC WITH DIFFERENTIAL/PLATELET
Basophils Absolute: 0.1 10*3/uL (ref 0–0.1)
Basophils Relative: 1 %
Eosinophils Absolute: 0.2 10*3/uL (ref 0–0.7)
Eosinophils Relative: 3 %
HCT: 36.2 % (ref 35.0–47.0)
Hemoglobin: 12.1 g/dL (ref 12.0–16.0)
Lymphocytes Relative: 14 %
Lymphs Abs: 0.8 10*3/uL — ABNORMAL LOW (ref 1.0–3.6)
MCH: 30 pg (ref 26.0–34.0)
MCHC: 33.6 g/dL (ref 32.0–36.0)
MCV: 89.2 fL (ref 80.0–100.0)
Monocytes Absolute: 0.3 10*3/uL (ref 0.2–0.9)
Monocytes Relative: 6 %
Neutro Abs: 4 10*3/uL (ref 1.4–6.5)
Neutrophils Relative %: 76 %
Platelets: 237 10*3/uL (ref 150–440)
RBC: 4.05 MIL/uL (ref 3.80–5.20)
RDW: 15.1 % — ABNORMAL HIGH (ref 11.5–14.5)
WBC: 5.3 10*3/uL (ref 3.6–11.0)

## 2017-12-23 LAB — COMPREHENSIVE METABOLIC PANEL
ALT: 14 U/L (ref 0–44)
AST: 20 U/L (ref 15–41)
Albumin: 3.9 g/dL (ref 3.5–5.0)
Alkaline Phosphatase: 83 U/L (ref 38–126)
Anion gap: 7 (ref 5–15)
BUN: 25 mg/dL — ABNORMAL HIGH (ref 8–23)
CO2: 26 mmol/L (ref 22–32)
Calcium: 9.1 mg/dL (ref 8.9–10.3)
Chloride: 106 mmol/L (ref 98–111)
Creatinine, Ser: 1.51 mg/dL — ABNORMAL HIGH (ref 0.44–1.00)
GFR calc Af Amer: 40 mL/min — ABNORMAL LOW (ref >60)
GFR calc non Af Amer: 34 mL/min — ABNORMAL LOW (ref >60)
Glucose, Bld: 110 mg/dL — ABNORMAL HIGH (ref 70–99)
Potassium: 5 mmol/L (ref 3.5–5.1)
Sodium: 139 mmol/L (ref 135–145)
Total Bilirubin: 1.1 mg/dL (ref 0.3–1.2)
Total Protein: 6.7 g/dL (ref 6.5–8.1)

## 2017-12-23 NOTE — Progress Notes (Signed)
Patient here for follow up. She has appointment with nephrology secondary to lab results.

## 2017-12-24 LAB — CA 125: Cancer Antigen (CA) 125: 14.3 U/mL (ref 0.0–38.1)

## 2017-12-27 NOTE — Progress Notes (Signed)
Hematology/Oncology Consult note [facility]  [phone] [phone]  Patient Care Team: [name], MD as PCP - General (Internal Medicine) [name], MD as Consulting Physician (General Surgery) Mellody Drown, MD as Referring Physician (Obstetrics and Gynecology) [name], MD as Referring Physician (Obstetrics and Gynecology) Hollice Espy, MD as Consulting Physician (Urology) [name], RN as Registered Nurse Sindy Guadeloupe, MD as Consulting Physician (Oncology)   Name of the patient: Jaime Hall  [id number]  05-Jun-1951   Date of visit: 12/27/17  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 [name] grade 1 endometrial cancer when she presented with postmenopausal bleeding in February 2018. She underwent robotic hysterectomy and bilateral salpingo-oophorectomy with washings and sentinel lymph node injection and mapping and biopsy on 07/29/2016. She was found to have extensive pelvic adhesive disease and underwent elevated pelvic adhesiolysis including enteric and ovariolysis. The cul de sac was obliterated and adherent to posterior cervix.-was negative.  2. Pathology showed: Pathology DIAGNOSIS:  A. SENTINEL LYMPH NODE, RIGHT MID OBTURATOR; EXCISION:  - NO TUMOR SEEN IN ONE LYMPH NODE (0/1).   B. SENTINEL LYMPH NODE, RIGHT EXTERNAL ILIAC; EXCISION:  - NO TUMOR SEEN IN ONE LYMPH NODE (0/1).   C. SENTINEL LYMPH NODE, LEFT EXTERNAL ILIAC; EXCISION:  - NO TUMOR SEEN IN ONE LYMPH NODE (0/1).   D. UTERUS WITH CERVIX, BILATERAL FALLOPIAN TUBES AND OVARIES;  HYSTERECTOMY AND BILATERAL SALPINGO-OOPHORECTOMY:  - SEROUS CARCINOMA, 4.5 CM(SEE NOTE).  - LYMPHOVASCULAR INVASION PRESENT.  - NO TUMOR SEEN IN BILATERAL OVARIES AND FALLOPIAN  TUBES.    ENDOMETRIUM:  Procedure: Hysterectomy with bilateral salpingo-oophorectomy  Histologic Type: Serous carcinoma  Myometrial Invasion: Present  Depth of invasion (millimeters): 13 mm  Myometrial thickness (millimeters): 14 mm  Percentage of myometrial invasion: 93%  Uterine Serosa Involvement: Not identified  Cervical Stromal Involvement: Not identified   BIOMARKER REPORTING TEMPLATE:  p53 Expression: Abnormal strong diffuse overexpression (>90%)  ER/PR negative. Her2 pending MS stable  3. She presented with fever on 08/05/2016 and CT scan showed 1.8 x 3.8 cm complex fluid collection containing pockets of air within somewhat organized walls within the pelvis and the hysterectomy bed most consistent with a developing abscess. There is a superior extension of the loculated fluid along the left lateral pelvic wall anterior to the left psoas muscle measuring 2 x 4 cm and another fluid collection along the anterior surface of the left psoas muscle measuring 1.8 x 3.6 cm. Blood cultures were positive for bacteria growing his fragilis. Urine cultures were negative. Patient was treated with IV antibiotics and discharged on Augmentin.  4. 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 [name] from urology on 08/14/2016. No leak was seen on CT urogram.  5. On 08/17/2016 patient underwent Exam under anesthesia, vaginoscopy, cystoscopy, cystogram with interpretation of fluoroscopy less than 30 minutes, bilateral retrograde pyelogram, and right ureteral stent placement. Intraoperative findings: No obvious ureterovaginal vesicovaginal fistula appreciated. Mild right hydroureteronephrosis down to level of the distal ureter with slight medial deviation without filling defects. Slight mucopurulent drainage from left apical vaginal cuff,  specimen cultured. Culture revealed bateroides fragilis and she was started on a 2 week course of Flagyl iniated on 08/24/2016  which she has now completed  6. Repeat CT abdomen on 09/10/2016 IMPRESSION: 1. Interval resolution of abscess involving the vaginal cuff. 2. The proximal end of the right ureteral stent is coiled in the proximal right ureter. No hydronephrosis. 3. No evidence of metastatic disease or other significant changes. 4. Aortic atherosclerosis  7. Patient has been recovering slowly following her complicated postoperative coursewithout fever or vaginal leakage. She did suffer from fatigue. She remained independent in her ADLs.She has a prior history of Hodgkin's lymphoma in 2011 treated with anthracycline-based chemotherapy  8. There was concern about port infection and was looked at by vascular surgery. They did not think it was infected and recommend conservative management.  9.Patient completed 3 cycles of carbo/taxol chemotherapy5/31/18. Taxol was dose reduced by 25% during dose 3 due to neuropathy.   10. Patient completed 5 weeks of external beam whole pelvic radiation 01/19/17.   Interval history- patient was seen by Dr. [name] in April 2019. She was reporting worsening fatigue and low back pain. This was followed by PET/Ct scan which did not reveal any malignancy. Her back pain has gradually improved since then but has not resolved completely. Her peripheral neuropathy is stable  ECOG PS- 1 Pain scale- 0   Review of systems- Review of Systems  Constitutional: Positive for malaise/fatigue. Negative for chills, fever and weight loss.  HENT: Negative for congestion, ear discharge and nosebleeds.   Eyes: Negative for blurred vision.  Respiratory: Negative for cough, hemoptysis, sputum production, shortness of breath and wheezing.   Cardiovascular: Negative for chest pain, palpitations, orthopnea and claudication.  Gastrointestinal: Negative  for abdominal pain, blood in stool, constipation, diarrhea, heartburn, melena, nausea and vomiting.  Genitourinary: Negative for dysuria, flank pain, frequency, hematuria and urgency.  Musculoskeletal: Positive for back pain. Negative for joint pain and myalgias.  Skin: Negative for rash.  Neurological: Negative for dizziness, tingling, focal weakness, seizures, weakness and headaches.  Endo/Heme/Allergies: Does not bruise/bleed easily.  Psychiatric/Behavioral: Negative for depression and suicidal ideas. The patient does not have insomnia.      Current treatment- observation  Allergies  Allergen Reactions  . Adhesive [Tape] Other (See Comments)    Burns the skin  . Z-Pak [Azithromycin] Itching  . Antifungal [Miconazole Nitrate] Rash  . Sulfa Antibiotics Nausea And Vomiting and Rash    N/T  . Tolnaftate Rash     Past Medical History:  Diagnosis Date  . Anxiety   . Cervicalgia   . Coronary artery disease    a. 02/2012 Stress echo: severe anterior wall ischemia;  b. 02/2012 Cath/PCI: LAD 95p (3.0 x 15 Xience EX DES), D1 90ost (PTCA - bifurcational dzs), EF 45% with anterior HK;  b. 02/2013 Ex MV: fixed anterior defect w/ minor reversibility, nl EF-->Med Rx.  . Endometrial cancer ([place])    a. 07/2016 s/p robotic hysterectomy, BSO w/ washings, sentinel node inj, mapping, bx, adhesiolysis.  . Essential hypertension, benign   . Fibrocystic breast disease   . GERD (gastroesophageal reflux disease)   . Gestational hypertension   . Heart murmur   . History of anemia   . History of blood transfusion   . Hodgkin's lymphoma ([place]) 2011   a. s/p radiation and chemo therapy  . Osteoarthritis   . Polycystic ovarian disease      Past Surgical History:  Procedure Laterality Date  . ABDOMINAL HYSTERECTOMY    . bladder sling    . CARDIAC CATHETERIZATION  02/2012   [facility] 1 stent place  . CERVICAL  POLYPECTOMY    . CHOLECYSTECTOMY  1982  . COLONOSCOPY WITH PROPOFOL N/A 02/05/2015   Procedure:  COLONOSCOPY WITH PROPOFOL;  Surgeon: Lucilla Lame, MD;  Location: [facility] ENDOSCOPY;  Service: Endoscopy;  Laterality: N/A;  . CORONARY ANGIOPLASTY  02/2012   left/right s/p balloon  . CYSTOGRAM N/A 08/17/2016   Procedure: CYSTOGRAM;  Surgeon: Hollice Espy, MD;  Location: [facility] ORS;  Service: Urology;  Laterality: N/A;  . CYSTOSCOPY N/A 08/17/2016   Procedure: CYSTOSCOPY EXAM UNDER ANESTHESIA;  Surgeon: Hollice Espy, MD;  Location: [facility] ORS;  Service: Urology;  Laterality: N/A;  . CYSTOSCOPY W/ RETROGRADES Bilateral 08/17/2016   Procedure: CYSTOSCOPY WITH RETROGRADE PYELOGRAM;  Surgeon: Hollice Espy, MD;  Location: [facility] ORS;  Service: Urology;  Laterality: Bilateral;  . CYSTOSCOPY WITH STENT PLACEMENT Right 08/17/2016   Procedure: CYSTOSCOPY WITH STENT PLACEMENT;  Surgeon: Hollice Espy, MD;  Location: [facility] ORS;  Service: Urology;  Laterality: Right;  . heart stent'  2013  . kidney stent Right 2018  . LYMPH NODE BIOPSY  2011   diagnosis of hodgkins lymphoma  . PELVIC LYMPH NODE DISSECTION N/A 07/29/2016   Procedure: PELVIC/AORTIC LYMPH NODE SAMPLING;  Surgeon: [name], MD;  Location: [facility] ORS;  Service: Gynecology;  Laterality: N/A;  . PORTA CATH INSERTION N/A 09/22/2016   Procedure: Glori Luis Cath Insertion;  Surgeon: [name], MD;  Location: [place] [facility] LAB;  Service: Cardiovascular;  Laterality: N/A;  . PORTA CATH REMOVAL N/A 11/17/2016   Procedure: Glori Luis Cath Removal;  Surgeon: [name], MD;  Location: [place] [facility];  Service: Cardiovascular;  Laterality: N/A;  . ROBOTIC ASSISTED TOTAL HYSTERECTOMY WITH BILATERAL SALPINGO OOPHERECTOMY N/A 07/29/2016   Procedure: ROBOTIC ASSISTED TOTAL HYSTERECTOMY WITH BILATERAL SALPINGO OOPHORECTOMY;  Surgeon: [name], MD;  Location: [facility] ORS;  Service: Gynecology;  Laterality: N/A;  . SENTINEL NODE BIOPSY N/A 07/29/2016   Procedure: SENTINEL NODE BIOPSY;  Surgeon: [name], MD;  Location: [facility]  ORS;  Service: Gynecology;  Laterality: N/A;  . transobturator sling N/A 2009   [place]    Social History   Socioeconomic History  . Marital status: Widowed    Spouse name: Not on file  . Number of children: Not on file  . Years of education: Not on file  . Highest education level: Not on file  Occupational History  . Not on file  Social Needs  . Financial resource strain: Not hard at all  . Food insecurity:    Worry: Never true    Inability: Never true  . Transportation needs:    Medical: No    Non-medical: No  Tobacco Use  . Smoking status: Former Smoker    Packs/day: 1.00    Years: 30.00    Pack years: 30.00    Types: Cigarettes    Last attempt to quit: 07/24/2002    Years since quitting: 15.4  . Smokeless tobacco: Never Used  . Tobacco comment: quit smoking in 2000  Substance and Sexual Activity  . Alcohol use: Yes    Comment: occasional, 1-2 drinks per month  . Drug use: No  . Sexual activity: Never  Lifestyle  . Physical activity:    Days per week: Not on file    Minutes per session: Not on file  . Stress: Only a little  Relationships  . Social connections:    Talks on phone: Not on file    Gets together: Not on file    Attends religious service: Not on file  Active member of club or organization: Not on file    Attends meetings of clubs or organizations: Not on file    Relationship status: Not on file  . Intimate partner violence:    Fear of current or ex partner: Not on file    Emotionally abused: Not on file    Physically abused: Not on file    Forced sexual activity: Not on file  Other Topics Concern  . 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     Family History  Problem Relation Age of Onset  . ALS Father   . Polymyositis Father   . Diabetes Brother   . Cancer Maternal Aunt        breast  . Breast cancer Maternal Aunt        30's  . Stroke Maternal  Grandmother   . Cancer Maternal Grandfather        prostate  . Stroke Maternal Grandfather   . Colon cancer Maternal Aunt   . Non-Hodgkin's lymphoma Cousin      Current Outpatient Medications:  .  acetaminophen (TYLENOL) 500 MG tablet, Take 1,000 mg by mouth every 6 (six) hours as needed for mild pain. , Disp: , Rfl:  .  aspirin EC 81 MG tablet, Take 81 mg by mouth at bedtime., Disp: , Rfl:  .  atorvastatin (LIPITOR) 10 MG tablet, TAKE ONE TABLET BY MOUTH ONCE DAILY, Disp: 90 tablet, Rfl: 3 .  Cholecalciferol (VITAMIN D3) 2000 units TABS, Take 2,000 Units by mouth 3 (three) times a week., Disp: , Rfl:  .  cyanocobalamin (,VITAMIN B-12,) 1000 MCG/ML injection, 1 ml injected IM weekly x 4 ,  Then monthly thereafter, Disp: 10 mL, Rfl: 2 .  diazepam (VALIUM) 5 MG tablet, Take 1 tablet (5 mg total) by mouth every 12 (twelve) hours as needed for anxiety., Disp: 30 tablet, Rfl: 0 .  esomeprazole (NEXIUM) 20 MG capsule, Take 20 mg by mouth daily before breakfast. , Disp: , Rfl:  .  metoprolol tartrate (LOPRESSOR) 25 MG tablet, TAKE ONE TABLET BY MOUTH TWICE DAILY, Disp: 180 tablet, Rfl: 3 .  nitroGLYCERIN (NITROSTAT) 0.4 MG SL tablet, Place 1 tablet (0.4 mg total) under the tongue every 5 (five) minutes as needed for chest pain., Disp: 25 tablet, Rfl: 3 .  Syringe/Needle, Disp, (SYRINGE 3CC/25GX1") 25G X 1" 3 ML MISC, Use for b12 injections, Disp: 50 each, Rfl: 0 .  Turmeric 500 MG CAPS, Take 500 mg by mouth 2 (two) times daily., Disp: , Rfl:   Physical exam:  Vitals:   12/23/17 1432 12/23/17 1437  BP:  107/69  Pulse:  72  Resp: 16   Temp:  (!) 97 F (36.1 C)  TempSrc:  Tympanic  Weight: 164 lb 11.2 oz (74.7 kg)   Height: '5\' 1"'  (1.549 m)    Physical Exam  Constitutional: She is oriented to person, place, and time. She appears well-developed and well-nourished.  HENT:  Head: Normocephalic and atraumatic.  Eyes: Pupils are equal, round, and reactive to light. EOM are normal.  Neck:  Normal range of motion.  Cardiovascular: Normal rate, regular rhythm and normal heart sounds.  Pulmonary/Chest: Effort normal and breath sounds normal.  Abdominal: Soft. Bowel sounds are normal.  Neurological: She is alert and oriented to person, place, and time.  Skin: Skin is warm and dry.     CMP Latest  Ref Rng & Units 12/23/2017  Glucose 70 - 99 mg/dL 110(H)  BUN 8 - 23 mg/dL 25(H)  Creatinine 0.44 - 1.00 mg/dL 1.51(H)  Sodium 135 - 145 mmol/L 139  Potassium 3.5 - 5.1 mmol/L 5.0  Chloride 98 - 111 mmol/L 106  CO2 22 - 32 mmol/L 26  Calcium 8.9 - 10.3 mg/dL 9.1  Total Protein 6.5 - 8.1 g/dL 6.7  Total Bilirubin 0.3 - 1.2 mg/dL 1.1  Alkaline Phos 38 - 126 U/L 83  AST 15 - 41 U/L 20  ALT 0 - 44 U/L 14   CBC Latest Ref Rng & Units 12/23/2017  WBC 3.6 - 11.0 K/uL 5.3  Hemoglobin 12.0 - 16.0 g/dL 12.1  Hematocrit 35.0 - 47.0 % 36.2  Platelets 150 - 440 K/uL 237      Assessment and plan- Patient is a 67 y.o. female h/o Stage I high risk serous endometrial cancer s/p 3 cyclesof carbo/taxol and WPRT. She is currently under obsevation  Endometrial cancer- clincially she is doing well. No evidence of recurrence on todays exam. Pelvic exam not done today and she sees Dr. [name] and Dr. Leonides Schanz as well. I will see her back in 6 months cbc/cmp and CA 125. Scans only if clinical signs of progression  2. Chemo induced peripheral neuropathy- stable. She does not wish to try lyrica and could not tolerate duloxetine     Visit Diagnosis 1. Endometrial cancer ([place])   2. Encounter for follow-up surveillance of endometrial cancer      Dr. Randa Evens, MD, MPH [facility] at [facility] [phone] 12/27/2017 8:19 AM

## 2018-01-13 DIAGNOSIS — N183 Chronic kidney disease, stage 3 (moderate): Secondary | ICD-10-CM | POA: Diagnosis not present

## 2018-01-13 DIAGNOSIS — I10 Essential (primary) hypertension: Secondary | ICD-10-CM | POA: Diagnosis not present

## 2018-01-13 LAB — CBC AND DIFFERENTIAL
HCT: 36 (ref 36–46)
Hemoglobin: 12 (ref 12.0–16.0)
Platelets: 240 (ref 150–399)
WBC: 5.3

## 2018-01-13 LAB — BASIC METABOLIC PANEL
BUN: 16 (ref 4–21)
Creatinine: 1.1 (ref 0.5–1.1)
Glucose: 82
Potassium: 5.2 (ref 3.4–5.3)
Sodium: 141 (ref 137–147)

## 2018-01-18 ENCOUNTER — Other Ambulatory Visit: Payer: Self-pay | Admitting: Nephrology

## 2018-01-18 DIAGNOSIS — N183 Chronic kidney disease, stage 3 unspecified: Secondary | ICD-10-CM

## 2018-01-26 ENCOUNTER — Ambulatory Visit
Admission: RE | Admit: 2018-01-26 | Discharge: 2018-01-26 | Disposition: A | Discharge status: Home / Self Care | Payer: PPO | Source: Ambulatory Visit | Attending: Nephrology | Admitting: Nephrology

## 2018-01-26 DIAGNOSIS — N183 Chronic kidney disease, stage 3 unspecified: Secondary | ICD-10-CM

## 2018-01-26 DIAGNOSIS — N281 Cyst of kidney, acquired: Secondary | ICD-10-CM | POA: Diagnosis not present

## 2018-02-02 DIAGNOSIS — N281 Cyst of kidney, acquired: Secondary | ICD-10-CM | POA: Diagnosis not present

## 2018-02-02 DIAGNOSIS — I10 Essential (primary) hypertension: Secondary | ICD-10-CM | POA: Diagnosis not present

## 2018-02-02 DIAGNOSIS — N183 Chronic kidney disease, stage 3 (moderate): Secondary | ICD-10-CM | POA: Diagnosis not present

## 2018-02-08 ENCOUNTER — Other Ambulatory Visit: Payer: Self-pay

## 2018-02-15 ENCOUNTER — Ambulatory Visit (INDEPENDENT_AMBULATORY_CARE_PROVIDER_SITE_OTHER): Payer: PPO | Admitting: Cardiovascular Disease

## 2018-02-15 ENCOUNTER — Encounter: Payer: Self-pay | Admitting: Cardiovascular Disease

## 2018-02-15 VITALS — BP 120/62 | HR 86 | Ht 61.0 in | Wt 166.2 lb

## 2018-02-15 DIAGNOSIS — I10 Essential (primary) hypertension: Secondary | ICD-10-CM

## 2018-02-15 DIAGNOSIS — I2510 Atherosclerotic heart disease of native coronary artery without angina pectoris: Secondary | ICD-10-CM

## 2018-02-15 DIAGNOSIS — E785 Hyperlipidemia, unspecified: Secondary | ICD-10-CM | POA: Diagnosis not present

## 2018-02-15 NOTE — Progress Notes (Signed)
Cardiology Office Note   Date:  02/15/2018   ID:  Jaime Hall, DOB 1950-12-16, MRN [id number]  PCP:  [name], MD  Cardiologist:   [name], MD   Chief Complaint  Patient presents with  . other    12 month follow up. Meds reviewed by the pt. verbally. "doing well."       History of Present Illness: Jaime Hall is a 67 y.o. female who presents for a followup visit regarding coronary artery disease.  She has history of Hodgkin's lymphoma treated with radiation and chemotherapy to the chest area and has been in remission since 2011. She does have pulmonary nodules which are being followed regularly every 6 months. She is a former smoker.  She had previous angioplasty and drug-eluting stent placement to the proximal LAD with balloon angioplasty of the diagonal in September 2013.  No cardiac events since then.  A treadmill nuclear stress test in September of 2014 showed a fixed anterior wall defect with minor reversibility. Ejection fraction was normal. She finished treatment for uterine cancer. She has been doing well with no recent chest pain, shortness of breath or palpitations.  Past Medical History:  Diagnosis Date  . Anxiety   . Cervicalgia   . Coronary artery disease    a. 02/2012 Stress echo: severe anterior wall ischemia;  b. 02/2012 Cath/PCI: LAD 95p (3.0 x 15 Xience EX DES), D1 90ost (PTCA - bifurcational dzs), EF 45% with anterior HK;  b. 02/2013 Ex MV: fixed anterior defect w/ minor reversibility, nl EF-->Med Rx.  . Endometrial cancer ([place])    a. 07/2016 s/p robotic hysterectomy, BSO w/ washings, sentinel node inj, mapping, bx, adhesiolysis.  . Essential hypertension, benign   . Fibrocystic breast disease   . GERD (gastroesophageal reflux disease)   . Gestational hypertension   . Heart murmur   . History of anemia   . History of blood transfusion   . Hodgkin's lymphoma ([place]) 2011   a. s/p radiation and chemo therapy  . Osteoarthritis   .  Polycystic ovarian disease     Past Surgical History:  Procedure Laterality Date  . ABDOMINAL HYSTERECTOMY    . bladder sling    . CARDIAC CATHETERIZATION  02/2012   [facility] 1 stent place  . CERVICAL POLYPECTOMY    . CHOLECYSTECTOMY  1982  . COLONOSCOPY WITH PROPOFOL N/A 02/05/2015   Procedure: COLONOSCOPY WITH PROPOFOL;  Surgeon: Lucilla Lame, MD;  Location: [facility];  Service: Endoscopy;  Laterality: N/A;  . CORONARY ANGIOPLASTY  02/2012   left/right s/p balloon  . CYSTOGRAM N/A 08/17/2016   Procedure: CYSTOGRAM;  Surgeon: Hollice Espy, MD;  Location: [facility];  Service: Urology;  Laterality: N/A;  . CYSTOSCOPY N/A 08/17/2016   Procedure: CYSTOSCOPY EXAM UNDER ANESTHESIA;  Surgeon: Hollice Espy, MD;  Location: [facility];  Service: Urology;  Laterality: N/A;  . CYSTOSCOPY W/ RETROGRADES Bilateral 08/17/2016   Procedure: CYSTOSCOPY WITH RETROGRADE PYELOGRAM;  Surgeon: Hollice Espy, MD;  Location: [facility];  Service: Urology;  Laterality: Bilateral;  . CYSTOSCOPY WITH STENT PLACEMENT Right 08/17/2016   Procedure: CYSTOSCOPY WITH STENT PLACEMENT;  Surgeon: Hollice Espy, MD;  Location: [facility];  Service: Urology;  Laterality: Right;  . heart stent'  2013  . kidney stent Right 2018  . LYMPH NODE BIOPSY  2011   diagnosis of hodgkins lymphoma  . PELVIC LYMPH NODE DISSECTION N/A 07/29/2016   Procedure: PELVIC/AORTIC LYMPH NODE SAMPLING;  Surgeon: [name], MD;  Location: [facility];  Service: Gynecology;  Laterality: N/A;  . PORTA CATH INSERTION N/A 09/22/2016   Procedure: Glori Luis Cath Insertion;  Surgeon: [name], MD;  Location: [facility];  Service: Cardiovascular;  Laterality: N/A;  . PORTA CATH REMOVAL N/A 11/17/2016   Procedure: Glori Luis Cath Removal;  Surgeon: [name], MD;  Location: [facility];  Service: Cardiovascular;  Laterality: N/A;  . ROBOTIC ASSISTED TOTAL HYSTERECTOMY WITH BILATERAL SALPINGO OOPHERECTOMY N/A 07/29/2016   Procedure:  ROBOTIC ASSISTED TOTAL HYSTERECTOMY WITH BILATERAL SALPINGO OOPHORECTOMY;  Surgeon: [name], MD;  Location: [facility];  Service: Gynecology;  Laterality: N/A;  . SENTINEL NODE BIOPSY N/A 07/29/2016   Procedure: SENTINEL NODE BIOPSY;  Surgeon: [name], MD;  Location: [facility];  Service: Gynecology;  Laterality: N/A;  . transobturator sling N/A 2009   [place]     Current Outpatient Medications  Medication Sig Dispense Refill  . acetaminophen (TYLENOL) 500 MG tablet Take 1,000 mg by mouth every 6 (six) hours as needed for mild pain.     Marland Kitchen aspirin EC 81 MG tablet Take 81 mg by mouth at bedtime.    Marland Kitchen atorvastatin (LIPITOR) 10 MG tablet TAKE ONE TABLET BY MOUTH ONCE DAILY 90 tablet 3  . Cholecalciferol (VITAMIN D3) 2000 units TABS Take 2,000 Units by mouth 3 (three) times a week.    . cyanocobalamin (,VITAMIN B-12,) 1000 MCG/ML injection 1 ml injected IM weekly x 4 ,  Then monthly thereafter 10 mL 2  . diazepam (VALIUM) 5 MG tablet Take 1 tablet (5 mg total) by mouth every 12 (twelve) hours as needed for anxiety. 30 tablet 0  . metoprolol tartrate (LOPRESSOR) 25 MG tablet TAKE ONE TABLET BY MOUTH TWICE DAILY 180 tablet 3  . nitroGLYCERIN (NITROSTAT) 0.4 MG SL tablet Place 1 tablet (0.4 mg total) under the tongue every 5 (five) minutes as needed for chest pain. 25 tablet 3  . Syringe/Needle, Disp, (SYRINGE 3CC/25GX1") 25G X 1" 3 ML MISC Use for b12 injections 50 each 0  . Turmeric 500 MG CAPS Take 500 mg by mouth 2 (two) times daily.     No current facility-administered medications for this visit.     Allergies:   Adhesive [tape]; Z-pak [azithromycin]; Antifungal [miconazole nitrate]; Sulfa antibiotics; and Tolnaftate    Social History:  The patient  reports that she quit smoking about 15 years ago. Her smoking use included cigarettes. She has a 30.00 pack-year smoking history. She has never used smokeless tobacco. She reports that she drinks alcohol. She reports  that she does not use drugs.   Family History:  The patient's family history includes ALS in her father; Breast cancer in her maternal aunt; Cancer in her maternal aunt and maternal grandfather; Colon cancer in her maternal aunt; Diabetes in her brother; Non-Hodgkin's lymphoma in her cousin; Polymyositis in her father; Stroke in her maternal grandfather and maternal grandmother.    ROS:  Please see the history of present illness.   Otherwise, review of systems are positive for none.   All other systems are reviewed and negative.    PHYSICAL EXAM: VS:  BP 120/62 (BP Location: Left Arm, Patient Position: Sitting, Cuff Size: Normal)   Pulse 86   Ht 5\' 1"  (1.549 m)   Wt 166 lb 4 oz (75.4 kg)   BMI 31.41 kg/m  , BMI Body mass index is 31.41 kg/m. GEN: Well nourished, well developed, in no acute distress  HEENT: normal  Neck: no JVD, carotid bruits, or masses Cardiac: RRR; no murmurs, rubs, or gallops,no edema  Respiratory:  clear to auscultation bilaterally, normal work of breathing GI: soft, nontender, nondistended, + BS MS: no deformity or atrophy  Skin: warm and dry, no rash Neuro:  Strength and sensation are intact Psych: euthymic mood, full affect   EKG:  EKG is ordered today. The ekg ordered today demonstrates normal sinus rhythm with no significant ST or T wave changes.   Recent Labs: 12/23/2017: ALT 14 01/13/2018: BUN 16; Creatinine 1.1; Hemoglobin 12.0; Platelets 240; Potassium 5.2; Sodium 141    Lipid Panel    Component Value Date/Time   CHOL 109 07/16/2017 1522   CHOL 110 04/11/2012 0835   TRIG 128.0 07/16/2017 1522   HDL 45.10 07/16/2017 1522   HDL 56 04/11/2012 0835   CHOLHDL 2 07/16/2017 1522   VLDL 25.6 07/16/2017 1522   LDLCALC 38 07/16/2017 1522   LDLCALC 41 04/11/2012 0835      Wt Readings from Last 3 Encounters:  02/15/18 166 lb 4 oz (75.4 kg)  12/23/17 164 lb 11.2 oz (74.7 kg)  11/10/17 163 lb 12.8 oz (74.3 kg)        ASSESSMENT AND PLAN:  1.   Coronary artery disease involving native coronary arteries without angina. She is overall doing well with no anginal symptoms. Continue medical therapy.  2.  Hyperlipidemia: Continue treatment with low dose atorvastatin. Most recent LDL was 38.  3. Hypertension: Blood pressure is controlled on metoprolol.    Disposition:   FU with me in 1 year  Signed,  [name], MD  02/15/2018 2:32 PM    [place]

## 2018-02-15 NOTE — Patient Instructions (Signed)
Medication Instructions: Your physician recommends that you continue on your current medications as directed. Please refer to the Current Medication list given to you today.  If you need a refill on your cardiac medications before your next appointment, please call your pharmacy.   Follow-Up: Your physician wants you to follow-up in 12 months with Dr. Arida. You will receive a reminder letter in the mail two months in advance. If you don't receive a letter, please call our office at [phone] to schedule this follow-up appointment.  Thank you for choosing [facility]!    

## 2018-03-01 ENCOUNTER — Other Ambulatory Visit: Payer: Self-pay

## 2018-03-03 ENCOUNTER — Other Ambulatory Visit: Payer: Self-pay | Admitting: Internal Medicine

## 2018-03-29 NOTE — Progress Notes (Signed)
Gynecologic Oncology Interval Visit   Referring Provider: Dr. 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 G2P2 female who is seen in consultation from [name] and Garwin Brothers for endometrial cancer.   She presents today for follow up. She was last seen 09/29/2017. At that time we ordered a PET scan.   10/06/2017 PET  IMPRESSION: NEGATIVE FOR MALIGNANCY 1. No findings of active malignancy in the neck, chest, abdomen, or pelvis. 2. Subacute sacral insufficiency fracture with transverse component at S1-2 and vertical components in both sacral ala. There is also a nondisplaced subacute fracture of the right L5 transverse process. Low-grade associated metabolic activity favors a benign etiology. 3. Other imaging findings of potential clinical significance: Aortic Atherosclerosis (ICD10-I70.0). Coronary atherosclerosis. Mild cardiomegaly. Stable paramediastinal fibrosis. Sigmoid diverticulosis.  Today, she reports continued numbness and 'thickened' feeling of bilateral foot, worse on bottoms. She previously tried cymbalta which was d/c d/t hypotension, no relief from gabapentin or acupuncture.  She has continued back pain with prior hx of sacral insufficiency fracture and fracture of L5 transverse process. She was evaluated for this and declined intervention and does not feel pain is worse today. She also has 'lump' on right middle finger which is chronic and unchanged.    She saw Dr. Leonides Schanz 12/2017 and Dr. Baruch Gouty 09/06/2017.  Gynecologic Oncology History Jaime Hall is a pleasant G38P2 female who is seen in consultation from [name] and Garwin Brothers for grade 1 endometrial cancer. See prior note for complete details.   Endometrial biopsy done 07/15/16 showed grade 1 endometrial cancer. Surgical management was recommended.   History is also significant for h/o Hodgkin's Lymphoma 2011 treated successfully at  [facility] with chemo and RT.   Bladder sling for SUI 2009.  Benign cervical polyp removed 5/16. Has some leg edema due to chronic venous insufficiency.  Had angioplasty and cardiac stents in 2013 and was on Plavix for three years, now just ASA.  Surgical management was recommended.   On 07/29/2016 she underwent robotic hysterectomy, and bilateral salpingo-oophorectomy with washings as well as sentinel node injection, mapping, and biopsy; pelvic adhesiolysis including enterolysis and ovariolysis > 45 minutes. She was noted to have severe pelvic adhesive disease involving gynecologic organs and the bowel. The cul de sac was obliterated and adherent to the posterior cervix. A bubble test was performed and was negative.   Postop she presented with fevers on 08/05/2016 and was admitted. CT scan 08/05/1016 revealed small pockets of air in the posterior pelvic floor likely postsurgical. There is a 1.8 x 3.8 cm complex fluid collection containing pockets of air with somewhat organized walls within the pelvis at the hysterectomy bed most consistent with developing abscess. There is superior extension of loculated fluid along the left lateral pelvic wall anterior to the left psoas muscle. The component of the loculated fluid along the left lateral pelvic wall measures 2.0 x 4.0 cm and fluid collection along the anterior surface of the left psoas muscle measures approximately 1.8 x 3.6 cm. Blood cultures: + bacterimia with bacteroides fragilis; urine cultures negative.   She was treated with IV antibiotics with improvement in her symptoms. She was discharged on 09/05/2015 on Augmentin.   She was seen by Dr. Leonides Schanz and complained of vaginal leakage. 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 Subsequently she underwent a double tied tests with oral Pyridium as well as backfilling the bladder with blue. Her vaginal tampon turned  orange/yellow the color of the oral Pyridium concerning for ureterovaginal  fistula.  Fluid in her vaginal vault was also appreciated. She was referred to Dr. [name], Urology on 08/14/2016. No obvious fistula or leak appreciated on CT urogram, although there is no complete delayed imaging to assess the right-sided ureter.   CT urogram 08/13/2016 IMPRESSION: 1. Stable size of an abscess at the vaginal cuff with superior extension along the left pelvic sidewall to the posterior margin of the mid sigmoid colon. 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 2. No hydronephrosis. Normal caliber ureters. No evidence of contrast extravasation from the ureters or bladder, with limitations as described. 3. No evidence of metastatic disease in the abdomen or pelvis. 4. Aortic atherosclerosis.   On 08/17/3016 she underwent Exam under anesthesia, vaginoscopy, cystoscopy, cystogram with interpretation of fluoroscopy less than 30 minutes, bilateral retrograde pyelogram, and right ureteral stent placement.   Intraoperative findings: No obvious ureterovaginal vesicovaginal fistula appreciated. Mild right hydroureteronephrosis down to level of the distal ureter with slight medial deviation without filling defects. Slight mucopurulent drainage from left apical vaginal cuff, specimen cultured.  Culture revealed bateroides fragilis and she was started on a 2 week course of Flagyl iniated on 08/24/2016.  Since that time she continues to do well. She presents today to discuss pathology findings and treatment plan. She is due to have the stent removed on 09/22/2016.   Pathology DIAGNOSIS:  A. SENTINEL LYMPH NODE, RIGHT MID OBTURATOR; EXCISION:  - NO TUMOR SEEN IN ONE LYMPH NODE (0/1).   B. SENTINEL LYMPH NODE, RIGHT EXTERNAL ILIAC; EXCISION:  - NO TUMOR SEEN IN ONE LYMPH NODE (0/1).   C. SENTINEL LYMPH NODE, LEFT EXTERNAL ILIAC; EXCISION:  - NO TUMOR SEEN IN ONE LYMPH NODE (0/1).   D. UTERUS WITH CERVIX, BILATERAL FALLOPIAN TUBES AND OVARIES;  HYSTERECTOMY AND BILATERAL  SALPINGO-OOPHORECTOMY:  - SEROUS CARCINOMA, 4.5 CM(SEE NOTE).  - LYMPHOVASCULAR INVASION PRESENT.  - NO TUMOR SEEN IN BILATERAL OVARIES AND FALLOPIAN TUBES.  ENDOMETRIUM:  Procedure: Hysterectomy with bilateral salpingo-oophorectomy  Histologic Type: Serous carcinoma  Myometrial Invasion: Present       Depth of invasion (millimeters): 13 mm  Myometrial thickness (millimeters): 14 mm       Percentage of myometrial invasion: 93%  Uterine Serosa Involvement: Not identified  Cervical Stromal Involvement: Not identified    BIOMARKER REPORTING TEMPLATE:  p53 Expression: Abnormal strong diffuse overexpression (>90%)  ER/PR negative.  MS stable Her2neu assessment was performed given serous histology and was negative.   CT scan on 09/10/2016 with findings that are very reassuring so stent removed. 1. Interval resolution of abscess involving the vaginal cuff. 2. The proximal end of the right ureteral stent is coiled in the proximal right ureter. No hydronephrosis. 3. No evidence of metastatic disease or other significant changes. 4. Aortic atherosclerosis.   She was referred to Dr. Janese Banks for adjuvant chemotherapy and completed 3 cycles of carbo/taxol. Notable side effects include neuropathy in hands due to prior chemo in past for Hodgkin's disease and more recent regimen.  Taxol dose was reduced 25% with cycle 3 due to neuropathy.    Whole pelvis radiation with Dr Baruch Gouty completed in August 2018.  CT A/P 08/31/2017 IMPRESSION: No evidence of recurrent or metastatic disease.  Problem List: Patient Active Problem List   Diagnosis Date Noted  . Chronic kidney disease, stage 2, mildly decreased GFR 10/05/2017  . B12 deficiency anemia 07/24/2017  . Normocytic anemia 07/18/2017  . Back pain 07/18/2017  . Chemotherapy-induced  peripheral neuropathy ([place]) 12/10/2016  . 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 Endometrial cancer ([place])   . Pelvic adhesive disease   . Lymphedema  07/20/2016  . Chronic venous insufficiency 07/20/2016  . PAD (peripheral artery disease) ([place]) 07/20/2016  . Postmenopausal bleeding 07/14/2016  . Class 1 obesity due to excess calories without serious comorbidity with body mass index (BMI) of 34.0 to 34.9 in adult 05/27/2016  . Bronchiectasis ([place]) 12/19/2015  . Pre-syncope 12/19/2015  . Hx of colonic polyps   . Benign neoplasm of sigmoid colon   . Prolapsed, uterovaginal, incomplete 06/24/2014  . Routine general medical examination at a health care facility 12/30/2012  . Obesity (BMI 30-39.9) 12/30/2012  . Hx of multiple pulmonary nodules 12/28/2012  . Plantar fasciitis of right foot 12/28/2012  . Neuropathy due to chemotherapeutic drug ([place]) 09/12/2012  . Hip pain, bilateral 09/12/2012  . History of Hodgkin's lymphoma 09/12/2012  . Coronary artery disease   . Hyperlipidemia     Past Medical History: Past Medical History:  Diagnosis Date  . Anxiety   . Cervicalgia   . Coronary artery disease    a. 02/2012 Stress echo: severe anterior wall ischemia;  b. 02/2012 Cath/PCI: LAD 95p (3.0 x 15 Xience EX DES), D1 90ost (PTCA - bifurcational dzs), EF 45% with anterior HK;  b. 02/2013 Ex MV: fixed anterior defect w/ minor reversibility, nl EF-->Med Rx.  . Endometrial cancer ([place])    a. 07/2016 s/p robotic hysterectomy, BSO w/ washings, sentinel node inj, mapping, bx, adhesiolysis.  . Essential hypertension, benign   . Fibrocystic breast disease   . GERD (gastroesophageal reflux disease)   . Gestational hypertension   . Heart murmur   . History of anemia   . History of blood transfusion   . Hodgkin's lymphoma ([place]) 2011   a. s/p radiation and chemo therapy  . Osteoarthritis   . Polycystic ovarian disease     Past Surgical History: Past Surgical History:  Procedure Laterality Date  . ABDOMINAL HYSTERECTOMY    . bladder sling    . CARDIAC CATHETERIZATION  02/2012   [facility] 1 stent place  . CERVICAL POLYPECTOMY    . CHOLECYSTECTOMY   1982  . COLONOSCOPY WITH PROPOFOL N/A 02/05/2015   Procedure: COLONOSCOPY WITH PROPOFOL;  Surgeon: Lucilla Lame, MD;  Location: [facility] ENDOSCOPY;  Service: Endoscopy;  Laterality: N/A;  . CORONARY ANGIOPLASTY  02/2012   left/right s/p balloon  . CYSTOGRAM N/A 08/17/2016   Procedure: CYSTOGRAM;  Surgeon: Hollice Espy, MD;  Location: [facility] ORS;  Service: Urology;  Laterality: N/A;  . CYSTOSCOPY N/A 08/17/2016   Procedure: CYSTOSCOPY EXAM UNDER ANESTHESIA;  Surgeon: Hollice Espy, MD;  Location: [facility] ORS;  Service: Urology;  Laterality: N/A;  . CYSTOSCOPY W/ RETROGRADES Bilateral 08/17/2016   Procedure: CYSTOSCOPY WITH RETROGRADE PYELOGRAM;  Surgeon: Hollice Espy, MD;  Location: [facility] ORS;  Service: Urology;  Laterality: Bilateral;  . CYSTOSCOPY WITH STENT PLACEMENT Right 08/17/2016   Procedure: CYSTOSCOPY WITH STENT PLACEMENT;  Surgeon: Hollice Espy, MD;  Location: [facility] ORS;  Service: Urology;  Laterality: Right;  . heart stent'  2013  . kidney stent Right 2018  . LYMPH NODE BIOPSY  2011   diagnosis of hodgkins lymphoma  . PELVIC LYMPH NODE DISSECTION N/A 07/29/2016   Procedure: PELVIC/AORTIC LYMPH NODE SAMPLING;  Surgeon: [name], MD;  Location: [facility] ORS;  Service: Gynecology;  Laterality: N/A;  . PORTA CATH INSERTION N/A 09/22/2016   Procedure: Glori Luis Cath Insertion;  Surgeon: [name], MD;  Location: [place] [facility];  Service: Cardiovascular;  Laterality: N/A;  . PORTA CATH REMOVAL N/A 11/17/2016   Procedure: Glori Luis Cath Removal;  Surgeon: [name], MD;  Location: [place] [facility];  Service: Cardiovascular;  Laterality: N/A;  . ROBOTIC ASSISTED TOTAL HYSTERECTOMY WITH BILATERAL SALPINGO OOPHERECTOMY N/A 07/29/2016   Procedure: ROBOTIC ASSISTED TOTAL HYSTERECTOMY WITH BILATERAL SALPINGO OOPHORECTOMY;  Surgeon: [name], MD;  Location: [facility];  Service: Gynecology;  Laterality: N/A;  . SENTINEL NODE BIOPSY N/A 07/29/2016   Procedure: SENTINEL NODE  BIOPSY;  Surgeon: [name], MD;  Location: [facility];  Service: Gynecology;  Laterality: N/A;  . transobturator sling N/A 2009   [place]    Family History: Family History  Problem Relation Age of Onset  . ALS Father   . Polymyositis Father   . Diabetes Brother   . Cancer Maternal Aunt        breast  . Breast cancer Maternal Aunt        30's  . Stroke Maternal Grandmother   . Cancer Maternal Grandfather        prostate  . Stroke Maternal Grandfather   . Colon cancer Maternal Aunt   . Non-Hodgkin's lymphoma Cousin     Social History: Works as Dealer Social History   Socioeconomic History  . Marital status: Widowed    Spouse name: Not on file  . Number of children: Not on file  . Years of education: Not on file  . Highest education level: Not on file  Occupational History  . Not on file  Social Needs  . Financial resource strain: Not hard at all  . Food insecurity:    Worry: Never true    Inability: Never true  . Transportation needs:    Medical: No    Non-medical: No  Tobacco Use  . Smoking status: Former Smoker    Packs/day: 1.00    Years: 30.00    Pack years: 30.00    Types: Cigarettes    Last attempt to quit: 07/24/2002    Years since quitting: 15.6  . Smokeless tobacco: Never Used  . Tobacco comment: quit smoking in 2000  Substance and Sexual Activity  . Alcohol use: Yes    Comment: occasional, 1-2 drinks per month  . Drug use: No  . Sexual activity: Never  Lifestyle  . Physical activity:    Days per week: Not on file    Minutes per session: Not on file  . Stress: Only a little  Relationships  . Social connections:    Talks on phone: Not on file    Gets together: Not on file    Attends religious service: Not on file    Active member of club or organization: Not on file    Attends meetings of clubs or organizations: Not on file    Relationship status: Not on file  . Intimate partner violence:    Fear of current or ex  partner: Not on file    Emotionally abused: Not on file    Physically abused: Not on file    Forced sexual activity: Not on file  Other Topics Concern  . 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     Allergies: Allergies  Allergen Reactions  . Adhesive [Tape] Other (See Comments)    Burns the skin  . Z-Pak [Azithromycin] Itching  .  Antifungal [Miconazole Nitrate] Rash  . Sulfa Antibiotics Nausea And Vomiting and Rash    N/T  . Tolnaftate Rash    Current Medications: Current Outpatient Medications  Medication Sig Dispense Refill  . aspirin EC 81 MG tablet Take 81 mg by mouth at bedtime.    Marland Kitchen atorvastatin (LIPITOR) 10 MG tablet TAKE ONE TABLET BY MOUTH ONCE DAILY 90 tablet 3  . Cholecalciferol (VITAMIN D3) 2000 units TABS Take 2,000 Units by mouth 3 (three) times a week.    . cyanocobalamin (,VITAMIN B-12,) 1000 MCG/ML injection 1 ml injected IM weekly x 4 ,  Then monthly thereafter 10 mL 2  . metoprolol tartrate (LOPRESSOR) 25 MG tablet TAKE ONE TABLET BY MOUTH TWICE DAILY 180 tablet 3  . Turmeric 500 MG CAPS Take 500 mg by mouth 2 (two) times daily.    Marland Kitchen acetaminophen (TYLENOL) 500 MG tablet Take 1,000 mg by mouth every 6 (six) hours as needed for mild pain.     . diazepam (VALIUM) 5 MG tablet Take 1 tablet (5 mg total) by mouth every 12 (twelve) hours as needed for anxiety. (Patient not taking: Reported on 03/30/2018) 30 tablet 0  . nitroGLYCERIN (NITROSTAT) 0.4 MG SL tablet Place 1 tablet (0.4 mg total) under the tongue every 5 (five) minutes as needed for chest pain. (Patient not taking: Reported on 03/30/2018) 25 tablet 3  . Syringe/Needle, Disp, (SYRINGE 3CC/25GX1") 25G X 1" 3 ML MISC Use for b12 injections (Patient not taking: Reported on 03/30/2018) 50 each 0   No current facility-administered medications for this visit.     Review of Systems General: fatigue  HEENT: no complaints   Lungs: no complaints  Cardiac: no complaints  GI: no complaints  GU: no complaints of bladder dysfunction today  Musculoskeletal: back pain  Extremities: leg swelling; cramps in legs, thighs, and chest  Skin: no complaints  Neuro: peripheral neuropathy with foot pain  Endocrine: no complaints  Psych: feeling sad     Objective:  Physical Examination:  BP 94/65 (BP Location: Left Arm, Patient Position: Sitting)   Pulse 80   Temp 97.9 F (36.6 C) (Tympanic)   Resp 18   Ht '5\' 1"'  (1.549 m)   Wt 165 lb 14.4 oz (75.3 kg)   BMI 31.35 kg/m    ECOG Performance Status: 1 - Symptomatic but completely ambulatory  General appearance: alert, cooperative and appears stated age HEENT:PERRLA, extra ocular movement intact and sclera clear, anicteric  CV: Regular rate and rhythm Lungs: bilateral clear to auscultation Lymph node survey: non-palpable, axillary, inguinal, supraclavicular.  Abdomen: soft, non-tender, without masses or organomegaly, no hernias and well healed incision Back: inspection of back is normal; nontender to palpation Skin exam - incisions all well healed. Extremity: bilaterally symmetrical no signficant swelling. Possible ganglion cyst middle finger Neurological exam reveals alert, oriented, normal speech, no focal findings or movement disorder noted.  Pelvic: exam chaperoned by NP;  Vulva: normal appearing vulva with no masses, tenderness or lesions; Vagina: normal vagina;  Adnexa/Uterus/Cervix:absent. Bimanual: no masses or tenderness.  Rectal: deferred    Assessment:  Jaime Hall is a 67 y.o. 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 Complete resolution of vaginal  cuff abscess.  Severe pelvic adhesive disease.  Now s/p 3 cycles of  carboplatin/taxol and whole pelvic radiation. Imaging and exam  NED.   Multiple complaints, most likely not malignancy related. Could potentially be due to prior therapy.   Medical co-morbidities complicating care: 30 pack year former smoker s/p coronary angioplasty and stents 2013 and takes baby ASA.  Plan:   Problem List Items Addressed This Visit      Genitourinary   Endometrial cancer ([place]) - Primary     I recommended she follow up with [name] for her symptoms as well as the ganglion cyst.   She is a year and 2 months from completion of treatment. Continue surveillance visit including pelvic exams every 3-6 months for 2-3 years, then every 6-12 months for 3-5 years and then annually thereafter. She will see Dr. Leonides Schanz in Jan 2020 and Dr. Baruch Gouty 08/2018.  We will also continue to follow closely with return to our clinic in 6 months.   I personally had a face to face interaction and evaluated the patient jointly with the NP, Ms. Beckey Rutter.  I have reviewed her history and available records and have performed the key portions of the physical exam including HEENT, general, abdominal exam, pelvic exam with my findings confirming those documented above by the APP.  I have discussed the case with the APP and the patient.  I agree with the above documentation, assessment and plan which was fully formulated by me.  Counseling was completed by me.   I personally saw the patient and performed a substantive portion of this encounter in conjunction with the listed APP as documented above.  A total of 25 minutes were spent with the patient/family today; >50% was spent in education, counseling and coordination of care for endometrial cancer.  Serenidy Waltz Gaetana Michaelis, MD   CC:  [name], MD [address] [place], [place] [address] [phone]  Larey Days, MD  Hollice Espy, MD

## 2018-03-30 ENCOUNTER — Encounter: Payer: Self-pay | Admitting: Obstetrics and Gynecology

## 2018-03-30 ENCOUNTER — Inpatient Hospital Stay: Payer: PPO | Attending: Obstetrics and Gynecology | Admitting: Obstetrics and Gynecology

## 2018-03-30 VITALS — BP 94/65 | HR 80 | Temp 97.9°F | Resp 18 | Ht 61.0 in | Wt 165.9 lb

## 2018-03-30 DIAGNOSIS — C541 Malignant neoplasm of endometrium: Secondary | ICD-10-CM

## 2018-03-30 DIAGNOSIS — Z8542 Personal history of malignant neoplasm of other parts of uterus: Secondary | ICD-10-CM

## 2018-03-30 DIAGNOSIS — Z90722 Acquired absence of ovaries, bilateral: Secondary | ICD-10-CM

## 2018-03-30 DIAGNOSIS — Z90710 Acquired absence of both cervix and uterus: Secondary | ICD-10-CM | POA: Diagnosis not present

## 2018-03-30 DIAGNOSIS — Z87891 Personal history of nicotine dependence: Secondary | ICD-10-CM

## 2018-03-30 DIAGNOSIS — Z08 Encounter for follow-up examination after completed treatment for malignant neoplasm: Secondary | ICD-10-CM

## 2018-03-30 NOTE — Progress Notes (Signed)
Patient c/o cramps in legs, thighs, under bilateral boobs. No gyn changes noted today

## 2018-03-31 NOTE — Telephone Encounter (Signed)
Pt wanted to only see [name] so pt has been scheduled for next Tuesday at 4:30. Pt is aware of a appt date and time.

## 2018-04-05 ENCOUNTER — Encounter: Payer: Self-pay | Admitting: Internal Medicine

## 2018-04-05 ENCOUNTER — Ambulatory Visit (INDEPENDENT_AMBULATORY_CARE_PROVIDER_SITE_OTHER): Payer: PPO | Admitting: Internal Medicine

## 2018-04-05 VITALS — BP 120/70 | HR 74 | Temp 97.9°F | Resp 15 | Ht 61.0 in | Wt 167.6 lb

## 2018-04-05 DIAGNOSIS — R252 Cramp and spasm: Secondary | ICD-10-CM | POA: Diagnosis not present

## 2018-04-05 NOTE — Patient Instructions (Signed)
Reduce your water intake to  40 ounces daily until you hear from me.  Eat something salty  Tonight with dinner!!!   Muscle Cramps and Spasms Muscle cramps and spasms are when muscles tighten by themselves. They usually get better within minutes. Muscle cramps are painful. They are usually stronger and last longer than muscle spasms. Muscle spasms may or may not be painful. They can last a few seconds or much longer. Follow these instructions at home:  Drink enough fluid to keep your pee (urine) clear or pale yellow.  Massage, stretch, and relax the muscle.  If directed, apply heat to tight or tense muscles as often as told by your doctor. Use the heat source that your doctor recommends. ? Place a towel between your skin and the heat source. ? Leave the heat on for 20-30 minutes. ? Take off the heat if your skin turns bright red. This is especially important if you are unable to feel pain, heat, or cold. You may have a greater risk of getting burned.  If directed, put ice on the affected area. This may help if you are sore or have pain after a cramp or spasm. ? Put ice in a plastic bag. ? Place a towel between your skin and the bag. ? Leave the ice on for 20 minutes, 2-3 times a day.  Take over-the-counter and prescription medicines only as told by your doctor.  Pay attention to any changes in your symptoms. Contact a doctor if:  Your cramps or spasms get worse or happen more often.  Your cramps or spasms do not get better with time. This information is not intended to replace advice given to you by your health care provider. Make sure you discuss any questions you have with your health care provider. Document Released: 05/14/2008 Document Revised: 07/03/2015 Document Reviewed: 03/05/2015 Elsevier Interactive Patient Education  2018 [employer].

## 2018-04-05 NOTE — Progress Notes (Signed)
Subjective:  Patient ID: Jaime Hall, female    DOB: 08/19/1950  Age: 67 y.o. MRN: [id number]  CC: The primary encounter diagnosis was Muscle cramps. A diagnosis of Cramps, muscle, general was also pertinent to this visit.  HPI JUNETTA HEARN presents for evaluation and management of recurrent muscle cramps. The crampshave been occurring daily for  the past month and have affected the thighs, feet/toes, abdominal muscles and hands.  She denies any recent medication changes,,  Does not use a diuretic.  She does recall that she started drinking more water on a daily basis about  a month ago,  Increased intake to 64 ounces which was a doubling of her water intake.  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 Outpatient Medications Prior to Visit  Medication Sig Dispense Refill  . acetaminophen (TYLENOL) 500 MG tablet Take 1,000 mg by mouth every 6 (six) hours as needed for mild pain.     Marland Kitchen aspirin EC 81 MG tablet Take 81 mg by mouth at bedtime.    Marland Kitchen atorvastatin (LIPITOR) 10 MG tablet TAKE ONE TABLET BY MOUTH ONCE DAILY 90 tablet 3  . Cholecalciferol (VITAMIN D3) 2000 units TABS Take 2,000 Units by mouth 3 (three) times a week.    . cyanocobalamin (,VITAMIN B-12,) 1000 MCG/ML injection 1 ml injected IM weekly x 4 ,  Then monthly thereafter 10 mL 2  . diazepam (VALIUM) 5 MG tablet Take 1 tablet (5 mg total) by mouth every 12 (twelve) hours as needed for anxiety. 30 tablet 0  . metoprolol tartrate (LOPRESSOR) 25 MG tablet TAKE ONE TABLET BY MOUTH TWICE DAILY 180 tablet 3  . nitroGLYCERIN (NITROSTAT) 0.4 MG SL tablet Place 1 tablet (0.4 mg total) under the tongue every 5 (five) minutes as needed for chest pain. 25 tablet 3  . Syringe/Needle, Disp, (SYRINGE 3CC/25GX1") 25G X 1" 3 ML MISC Use for b12 injections 50 each 0  . Turmeric 500 MG CAPS Take 500 mg by mouth 2 (two) times daily.     No facility-administered medications prior to  visit.     Review of Systems;  Patient denies headache, fevers, malaise, unintentional weight loss, skin rash, eye pain, sinus congestion and sinus pain, sore throat, dysphagia,  hemoptysis , cough, dyspnea, wheezing, chest pain, palpitations, orthopnea, edema, abdominal pain, nausea, melena, diarrhea, constipation, flank pain, dysuria, hematuria, urinary  Frequency, nocturia, numbness, tingling, seizures,  Focal weakness, Loss of consciousness,  Tremor, insomnia, depression, anxiety, and suicidal ideation.      Objective:  BP 120/70 (BP Location: Left Arm, Patient Position: Sitting, Cuff Size: Large)   Pulse 74   Temp 97.9 F (36.6 C) (Oral)   Resp 15   Ht 5\' 1"  (1.549 m)   Wt 167 lb 9.6 oz (76 kg)   SpO2 98%   BMI 31.67 kg/m   BP Readings from Last 3 Encounters:  04/05/18 120/70  03/30/18 94/65  02/15/18 120/62    Wt Readings from Last 3 Encounters:  04/05/18 167 lb 9.6 oz (76 kg)  03/30/18 165 lb 14.4 oz (75.3 kg)  02/15/18 166 lb 4 oz (75.4 kg)    General appearance: alert, cooperative and appears stated age Ears: normal TM's and external ear canals both ears Throat: lips, mucosa, and tongue normal; teeth and gums normal Neck: no adenopathy, no carotid bruit, supple, symmetrical, trachea midline and thyroid not enlarged, symmetric, no tenderness/mass/nodules Back: symmetric, no curvature.  ROM normal. No CVA tenderness. Lungs: clear to auscultation bilaterally Heart: regular rate and rhythm, S1, S2 normal, no murmur, click, rub or gallop Abdomen: soft, non-tender; bowel sounds normal; no masses,  no organomegaly Pulses: 2+ and symmetric Skin: Skin color, texture, turgor normal. No rashes or lesions Lymph nodes: Cervical, supraclavicular, and axillary nodes normal.    US Renal  Result Date: 01/26/2018 CLINICAL DATA:  Initial evaluation for chronic kidney disease stage 3. EXAM: RENAL / URINARY TRACT ULTRASOUND COMPLETE COMPARISON:  Prior CT from 08/30/2017 FINDINGS:  Right Kidney: Length: 8.9 cm. Echogenicity within normal limits. No nephrolithiasis or hydronephrosis. 2.7 x 2.2 x 2.6 cm simple cyst present at the lower pole the right kidney. Left Kidney: Length: 8.8 cm. Echogenicity within normal limits. No nephrolithiasis or hydronephrosis. No cystic or solid mass. Bladder: Appears normal for degree of bladder distention. IMPRESSION: 1. Normal sonographic appearance of the renal parenchyma. No hydronephrosis. 2. 2.7 cm simple cyst at the lower pole of the right kidney. Electronically Signed   By: Jeannine Boga M.D.   On: 01/26/2018 23:11    Assessment & Plan:   Problem List Items Addressed This Visit    Cramps, muscle, general    Etiology unclear.  Sodium, potassium, mg, ionized calcium. Thyroid and ck are all normal.   Will have her stop the statin and reassess.  Lab Results  Component Value Date   NA 140 04/05/2018   K 4.6 04/05/2018   CL 106 04/05/2018   CO2 27 04/05/2018   Lab Results  Component Value Date   TSH 4.20 04/05/2018   Lab Results  Component Value Date   CKTOTAL 74 04/05/2018   CKMB 1.4 03/04/2012   TROPONINI 0.10 (HH) 08/07/2016          Other Visit Diagnoses    Muscle cramps    -  Primary   Relevant Orders   Basic metabolic panel (Completed)   Magnesium (Completed)   TSH (Completed)   Calcium, ionized (Completed)   CK (Creatine Kinase) (Completed)      I am having Jaime Hall maintain her nitroGLYCERIN, aspirin EC, Turmeric, Vitamin D3, acetaminophen, metoprolol tartrate, atorvastatin, cyanocobalamin, SYRINGE 3CC/25GX1", and diazepam.  No orders of the defined types were placed in this encounter.   There are no discontinued medications.  Follow-up: No follow-ups on file.   [name], MD

## 2018-04-06 DIAGNOSIS — R252 Cramp and spasm: Secondary | ICD-10-CM | POA: Insufficient documentation

## 2018-04-06 LAB — BASIC METABOLIC PANEL
BUN: 21 mg/dL (ref 6–23)
CO2: 27 mEq/L (ref 19–32)
Calcium: 9.5 mg/dL (ref 8.4–10.5)
Chloride: 106 mEq/L (ref 96–112)
Creatinine, Ser: 1.27 mg/dL — ABNORMAL HIGH (ref 0.40–1.20)
GFR: 44.41 mL/min — ABNORMAL LOW (ref >60.00)
Glucose, Bld: 101 mg/dL — ABNORMAL HIGH (ref 70–99)
Potassium: 4.6 mEq/L (ref 3.5–5.1)
Sodium: 140 mEq/L (ref 135–145)

## 2018-04-06 LAB — MAGNESIUM: Magnesium: 2.1 mg/dL (ref 1.5–2.5)

## 2018-04-06 LAB — CK: Total CK: 74 U/L (ref 7–177)

## 2018-04-06 LAB — TSH: TSH: 4.2 u[IU]/mL (ref 0.35–4.50)

## 2018-04-06 LAB — CALCIUM, IONIZED: Calcium, Ion: 5.13 mg/dL (ref 4.8–5.6)

## 2018-04-06 NOTE — Assessment & Plan Note (Addendum)
Etiology unclear.  Sodium, potassium, mg, ionized calcium. Thyroid and ck are all normal.   Will have her stop the statin and reassess.  Lab Results  Component Value Date   NA 140 04/05/2018   K 4.6 04/05/2018   CL 106 04/05/2018   CO2 27 04/05/2018   Lab Results  Component Value Date   TSH 4.20 04/05/2018   Lab Results  Component Value Date   CKTOTAL 74 04/05/2018   CKMB 1.4 03/04/2012   TROPONINI 0.10 (HH) 08/07/2016

## 2018-05-11 ENCOUNTER — Other Ambulatory Visit: Payer: Self-pay | Admitting: Cardiovascular Disease

## 2018-06-28 ENCOUNTER — Inpatient Hospital Stay: Payer: PPO | Attending: Oncology

## 2018-06-28 ENCOUNTER — Encounter: Payer: Self-pay | Admitting: Oncology

## 2018-06-28 ENCOUNTER — Inpatient Hospital Stay (HOSPITAL_BASED_OUTPATIENT_CLINIC_OR_DEPARTMENT_OTHER): Payer: PPO | Admitting: Oncology

## 2018-06-28 VITALS — BP 135/75 | HR 76 | Temp 97.3°F | Resp 18 | Wt 165.0 lb

## 2018-06-28 DIAGNOSIS — I10 Essential (primary) hypertension: Secondary | ICD-10-CM

## 2018-06-28 DIAGNOSIS — C541 Malignant neoplasm of endometrium: Secondary | ICD-10-CM | POA: Diagnosis not present

## 2018-06-28 DIAGNOSIS — Z87891 Personal history of nicotine dependence: Secondary | ICD-10-CM

## 2018-06-28 DIAGNOSIS — R252 Cramp and spasm: Secondary | ICD-10-CM | POA: Insufficient documentation

## 2018-06-28 DIAGNOSIS — Z08 Encounter for follow-up examination after completed treatment for malignant neoplasm: Secondary | ICD-10-CM

## 2018-06-28 DIAGNOSIS — Z7982 Long term (current) use of aspirin: Secondary | ICD-10-CM

## 2018-06-28 DIAGNOSIS — Z79899 Other long term (current) drug therapy: Secondary | ICD-10-CM

## 2018-06-28 DIAGNOSIS — G620 Drug-induced polyneuropathy: Secondary | ICD-10-CM

## 2018-06-28 DIAGNOSIS — Z90722 Acquired absence of ovaries, bilateral: Secondary | ICD-10-CM | POA: Diagnosis not present

## 2018-06-28 DIAGNOSIS — T451X5D Adverse effect of antineoplastic and immunosuppressive drugs, subsequent encounter: Secondary | ICD-10-CM | POA: Insufficient documentation

## 2018-06-28 DIAGNOSIS — Z90710 Acquired absence of both cervix and uterus: Secondary | ICD-10-CM | POA: Insufficient documentation

## 2018-06-28 DIAGNOSIS — Z9079 Acquired absence of other genital organ(s): Secondary | ICD-10-CM

## 2018-06-28 DIAGNOSIS — Z8542 Personal history of malignant neoplasm of other parts of uterus: Principal | ICD-10-CM

## 2018-06-28 DIAGNOSIS — T451X5A Adverse effect of antineoplastic and immunosuppressive drugs, initial encounter: Secondary | ICD-10-CM

## 2018-06-28 LAB — COMPREHENSIVE METABOLIC PANEL
ALT: 18 U/L (ref 0–44)
AST: 26 U/L (ref 15–41)
Albumin: 4.2 g/dL (ref 3.5–5.0)
Alkaline Phosphatase: 76 U/L (ref 38–126)
Anion gap: 8 (ref 5–15)
BUN: 27 mg/dL — ABNORMAL HIGH (ref 8–23)
CO2: 27 mmol/L (ref 22–32)
Calcium: 9.1 mg/dL (ref 8.9–10.3)
Chloride: 109 mmol/L (ref 98–111)
Creatinine, Ser: 1.69 mg/dL — ABNORMAL HIGH (ref 0.44–1.00)
GFR calc Af Amer: 36 mL/min — ABNORMAL LOW (ref >60)
GFR calc non Af Amer: 31 mL/min — ABNORMAL LOW (ref >60)
Glucose, Bld: 92 mg/dL (ref 70–99)
Potassium: 4.4 mmol/L (ref 3.5–5.1)
Sodium: 144 mmol/L (ref 135–145)
Total Bilirubin: 1.1 mg/dL (ref 0.3–1.2)
Total Protein: 6.9 g/dL (ref 6.5–8.1)

## 2018-06-28 LAB — CBC WITH DIFFERENTIAL/PLATELET
Abs Immature Granulocytes: 0.02 10*3/uL (ref 0.00–0.07)
Basophils Absolute: 0 10*3/uL (ref 0.0–0.1)
Basophils Relative: 1 %
Eosinophils Absolute: 0.1 10*3/uL (ref 0.0–0.5)
Eosinophils Relative: 1 %
HCT: 37.5 % (ref 36.0–46.0)
Hemoglobin: 12 g/dL (ref 12.0–15.0)
Immature Granulocytes: 0 %
Lymphocytes Relative: 15 %
Lymphs Abs: 0.9 10*3/uL (ref 0.7–4.0)
MCH: 28.6 pg (ref 26.0–34.0)
MCHC: 32 g/dL (ref 30.0–36.0)
MCV: 89.3 fL (ref 80.0–100.0)
Monocytes Absolute: 0.4 10*3/uL (ref 0.1–1.0)
Monocytes Relative: 7 %
Neutro Abs: 4.4 10*3/uL (ref 1.7–7.7)
Neutrophils Relative %: 76 %
Platelets: 237 10*3/uL (ref 150–400)
RBC: 4.2 MIL/uL (ref 3.87–5.11)
RDW: 13.7 % (ref 11.5–15.5)
WBC: 5.8 10*3/uL (ref 4.0–10.5)
nRBC: 0 % (ref 0.0–0.2)

## 2018-06-28 NOTE — Progress Notes (Signed)
Patient here today for follow up of her endometrial cancer. She states that she finished her chemotherapy treatments in 2018. She complains of intermittent leg cramping for the past several months, which is sometimes severe. She also states that she has noticed her arms, hands and feet periodically going numb, and that when she was in the lab today having her blood drawn, her left arm went numb for a few minutes.

## 2018-06-29 ENCOUNTER — Encounter: Payer: Self-pay | Admitting: Oncology

## 2018-06-29 LAB — CA 125: Cancer Antigen (CA) 125: 14.4 U/mL (ref 0.0–38.1)

## 2018-06-30 MED ORDER — DULOXETINE HCL 30 MG PO CPEP: 30.0000 mg | ORAL_CAPSULE | Freq: Every day | ORAL | 0 refills | Status: DC

## 2018-06-30 NOTE — Progress Notes (Signed)
Hematology/Oncology Consult note [facility]  Telephone:([phone] [phone]  Patient Care Team: [name], MD as PCP - General (Internal Medicine) [name], MD as Consulting Physician (General Surgery) Mellody Drown, MD as Referring Physician (Obstetrics and Gynecology) [name], MD as Referring Physician (Obstetrics and Gynecology) Hollice Espy, MD as Consulting Physician (Urology) [name], RN as Registered Nurse Sindy Guadeloupe, MD as Consulting Physician (Oncology)   Name of the patient: Jaime Hall  [id number]  May 26, 1951   Date of visit: 06/30/18  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 [name] grade 1 endometrial cancer when she presented with postmenopausal bleeding in February 2018. She underwent robotic hysterectomy and bilateral salpingo-oophorectomy with washings and sentinel lymph node injection and mapping and biopsy on 07/29/2016. She was found to have extensive pelvic adhesive disease and underwent elevated pelvic adhesiolysis including enteric and ovariolysis. The cul de sac was obliterated and adherent to posterior cervix.-was negative.  2. Pathology showed: Pathology DIAGNOSIS:  A. SENTINEL LYMPH NODE, RIGHT MID OBTURATOR; EXCISION:  - NO TUMOR SEEN IN ONE LYMPH NODE (0/1).   B. SENTINEL LYMPH NODE, RIGHT EXTERNAL ILIAC; EXCISION:  - NO TUMOR SEEN IN ONE LYMPH NODE (0/1).   C. SENTINEL LYMPH NODE, LEFT EXTERNAL ILIAC; EXCISION:  - NO TUMOR SEEN IN ONE LYMPH NODE (0/1).   D. UTERUS WITH CERVIX, BILATERAL FALLOPIAN TUBES AND OVARIES;  HYSTERECTOMY AND BILATERAL SALPINGO-OOPHORECTOMY:  - SEROUS CARCINOMA, 4.5 CM(SEE NOTE).  - LYMPHOVASCULAR INVASION PRESENT.  - NO TUMOR SEEN IN BILATERAL OVARIES AND  FALLOPIAN TUBES.    ENDOMETRIUM:  Procedure: Hysterectomy with bilateral salpingo-oophorectomy  Histologic Type: Serous carcinoma  Myometrial Invasion: Present  Depth of invasion (millimeters): 13 mm  Myometrial thickness (millimeters): 14 mm  Percentage of myometrial invasion: 93%  Uterine Serosa Involvement: Not identified  Cervical Stromal Involvement: Not identified   BIOMARKER REPORTING TEMPLATE:  p53 Expression: Abnormal strong diffuse overexpression (>90%)  ER/PR negative. Her2 pending MS stable  3. She presented with fever on 08/05/2016 and CT scan showed 1.8 x 3.8 cm complex fluid collection containing pockets of air within somewhat organized walls within the pelvis and the hysterectomy bed most consistent with a developing abscess. There is a superior extension of the loculated fluid along the left lateral pelvic wall anterior to the left psoas muscle measuring 2 x 4 cm and another fluid collection along the anterior surface of the left psoas muscle measuring 1.8 x 3.6 cm. Blood cultures were positive for bacteria growing his fragilis. Urine cultures were negative. Patient was treated with IV antibiotics and discharged on Augmentin.  4. 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 [name] from urology on 08/14/2016. No leak was seen on CT urogram.  5. On 08/17/2016 patient underwent Exam under anesthesia, vaginoscopy, cystoscopy, cystogram with interpretation of fluoroscopy less than 30 minutes, bilateral retrograde pyelogram, and right ureteral stent placement. Intraoperative findings: No obvious ureterovaginal vesicovaginal fistula appreciated. Mild right hydroureteronephrosis down to level of the distal ureter with slight medial deviation without filling defects. Slight mucopurulent drainage from left apical vaginal  cuff, specimen cultured. Culture revealed bateroides fragilis and she was started on a 2 week course of Flagyl iniated on 08/24/2016 which  she has now completed  6. Repeat CT abdomen on 09/10/2016 IMPRESSION: 1. Interval resolution of abscess involving the vaginal cuff. 2. The proximal end of the right ureteral stent is coiled in the proximal right ureter. No hydronephrosis. 3. No evidence of metastatic disease or other significant changes. 4. Aortic atherosclerosis  7. Patient has been recovering slowly following her complicated postoperative coursewithout fever or vaginal leakage. She did suffer from fatigue. She remained independent in her ADLs.She has a prior history of Hodgkin's lymphoma in 2011 treated with anthracycline-based chemotherapy  8. There was concern about port infection and was looked at by vascular surgery. They did not think it was infected and recommend conservative management.  9.Patient completed 3 cycles of carbo/taxol chemotherapy5/31/18. Taxol was dose reduced by 25% during dose 3 due to neuropathy.   10. Patient completed 5 weeks of external beam whole pelvic radiation 01/19/17.   Interval history-reports having bilateral leg cramps when she is at work.  Also has intermittent tingling numbness in her bilateral fingers.  Reports that when they were drawing blood today her arm transiently felt numb and then it got better.  Her appetite is good and her weight has been stable.  Denies any abdominal pain distention nausea vomiting or diarrhea  ECOG PS- 1 Pain scale- 0 Opioid associated constipation- no  Review of systems- Review of Systems  Constitutional: Positive for malaise/fatigue. Negative for chills, fever and weight loss.  HENT: Negative for congestion, ear discharge and nosebleeds.   Eyes: Negative for blurred vision.  Respiratory: Negative for cough, hemoptysis, sputum production, shortness of breath and wheezing.     Cardiovascular: Negative for chest pain, palpitations, orthopnea and claudication.  Gastrointestinal: Negative for abdominal pain, blood in stool, constipation, diarrhea, heartburn, melena, nausea and vomiting.  Genitourinary: Negative for dysuria, flank pain, frequency, hematuria and urgency.  Musculoskeletal: Negative for back pain, joint pain and myalgias.  Skin: Negative for rash.  Neurological: Positive for sensory change (peripheral neuropathy). Negative for dizziness, tingling, focal weakness, seizures, weakness and headaches.  Endo/Heme/Allergies: Does not bruise/bleed easily.  Psychiatric/Behavioral: Negative for depression and suicidal ideas. The patient does not have insomnia.       Allergies  Allergen Reactions  . Adhesive [Tape] Other (See Comments)    Burns the skin  . Z-Pak [Azithromycin] Itching  . Antifungal [Miconazole Nitrate] Rash  . Sulfa Antibiotics Nausea And Vomiting and Rash    N/T  . Tolnaftate Rash     Past Medical History:  Diagnosis Date  . Anxiety   . Cervicalgia   . Coronary artery disease    a. 02/2012 Stress echo: severe anterior wall ischemia;  b. 02/2012 Cath/PCI: LAD 95p (3.0 x 15 Xience EX DES), D1 90ost (PTCA - bifurcational dzs), EF 45% with anterior HK;  b. 02/2013 Ex MV: fixed anterior defect w/ minor reversibility, nl EF-->Med Rx.  . Endometrial cancer ([place])    a. 07/2016 s/p robotic hysterectomy, BSO w/ washings, sentinel node inj, mapping, bx, adhesiolysis.  . Essential hypertension, benign   . Fibrocystic breast disease   . GERD (gastroesophageal reflux disease)   . Gestational hypertension   . Heart murmur   . History of anemia   . History of blood transfusion   . Hodgkin's lymphoma ([place]) 2011   a. s/p radiation and chemo therapy  . Osteoarthritis   . Polycystic ovarian disease      Past Surgical History:  Procedure Laterality Date  . ABDOMINAL HYSTERECTOMY    . bladder sling    .  CARDIAC CATHETERIZATION  02/2012   [facility] 1  stent place  . CERVICAL POLYPECTOMY    . CHOLECYSTECTOMY  1982  . COLONOSCOPY WITH PROPOFOL N/A 02/05/2015   Procedure: COLONOSCOPY WITH PROPOFOL;  Surgeon: Lucilla Lame, MD;  Location: [facility] END[facility]COPY;  Service: Endoscopy;  Laterality: N/A;  . CORONARY ANGIOPLASTY  02/2012   left/right s/p balloon  . CYSTOGRAM N/A 08/17/2016   Procedure: CYSTOGRAM;  Surgeon: Hollice Espy, MD;  Location: [facility] ORS;  Service: Urology;  Laterality: N/A;  . CYSTOSCOPY N/A 08/17/2016   Procedure: CYSTOSCOPY EXAM UNDER ANESTHESIA;  Surgeon: Hollice Espy, MD;  Location: [facility] ORS;  Service: Urology;  Laterality: N/A;  . CYSTOSCOPY W/ RETROGRADES Bilateral 08/17/2016   Procedure: CYSTOSCOPY WITH RETROGRADE PYELOGRAM;  Surgeon: Hollice Espy, MD;  Location: [facility] OR[facility];  Service: Urology;  Laterality: Bilateral;  . CYSTOSCOPY WITH STENT PLACEMENT Right 08/17/2016   Procedure: CYSTOSCOPY WITH STENT PLACEMENT;  Surgeon: Hollice Espy, MD;  Location: [facility] OR[facility];  Service: Urology;  Laterality: Right;  . heart stent'  2013  . kidney stent Right 2018  . LYMPH NODE BIOPSY  2011   diagnosis of hodgkins lymphoma  . PELVIC LYMPH NODE DISSECTION N/A 07/29/2016   Procedure: PELVIC/AORTIC LYMPH NODE SAMPLING;  Surgeon: [name], MD;  Location: [facility] ORS;  Service: Gynecology;  Laterality: N/A;  . PORTA CATH INSERTION N/A 09/22/2016   Procedure: Glori Luis Cath Insertion;  Surgeon: [name], MD;  Location: [place] [facility];  Service: Cardiovascular;  Laterality: N/A;  . PORTA CATH REMOVAL N/A 11/17/2016   Procedure: Glori Luis Cath Removal;  Surgeon: [name], MD;  Location: [place] [facility];  Service: Cardiovascular;  Laterality: N/A;  . ROBOTIC ASSISTED TOTAL HYSTERECTOMY WITH BILATERAL SALPINGO OOPHERECTOMY N/A 07/29/2016   Procedure: ROBOTIC ASSISTED TOTAL HYSTERECTOMY WITH BILATERAL SALPINGO OOPHORECTOMY;  Surgeon: [name], MD;  Location: [facility] OR[facility];  Service: Gynecology;  Laterality: N/A;  .  SENTINEL NODE BIOPSY N/A 07/29/2016   Procedure: SENTINEL NODE BIOPSY;  Surgeon: [name], MD;  Location: [facility] OR[facility];  Service: Gynecology;  Laterality: N/A;  . transobturator sling N/A 2009   [place]    Social History   Socioeconomic History  . Marital status: Widowed    Spouse name: Not on file  . Number of children: Not on file  . Years of education: Not on file  . Highest education level: Not on file  Occupational History  . Not on file  Social Needs  . Financial resource strain: Not hard at all  . Food insecurity:    Worry: Never true    Inability: Never true  . Transportation needs:    Medical: No    Non-medical: No  Tobacco Use  . Smoking status: Former Smoker    Packs/day: 1.00    Years: 30.00    Pack years: 30.00    Types: Cigarettes    Last attempt to quit: 07/24/2002    Years since quitting: 15.9  . Smokeless tobacco: Never Used  . Tobacco comment: quit smoking in 2000  Substance and Sexual Activity  . Alcohol use: Yes    Comment: occasional, 1-2 drinks per month  . Drug use: No  . Sexual activity: Never  Lifestyle  . Physical activity:    Days per week: Not on file    Minutes per session: Not on file  . Stress: Only a little  Relationships  . Social connections:    Talks on phone: Not on file    Gets  together: Not on file    Attends religious service: Not on file    Active member of club or organization: Not on file    Attends meetings of clubs or organizations: Not on file    Relationship status: Not on file  . Intimate partner violence:    Fear of current or ex partner: Not on file    Emotionally abused: Not on file    Physically abused: Not on file    Forced sexual activity: Not on file  Other Topics Concern  . 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     Family History  Problem Relation Age of Onset  . ALS Father   . Polymyositis Father     . Diabetes Brother   . Cancer Maternal Aunt        breast  . Breast cancer Maternal Aunt        30's  . Stroke Maternal Grandmother   . Cancer Maternal Grandfather        prostate  . Stroke Maternal Grandfather   . Colon cancer Maternal Aunt   . Non-Hodgkin's lymphoma Cousin      Current Outpatient Medications:  .  acetaminophen (TYLENOL) 500 MG tablet, Take 1,000 mg by mouth every 6 (six) hours as needed for mild pain. , Disp: , Rfl:  .  aspirin EC 81 MG tablet, Take 81 mg by mouth at bedtime., Disp: , Rfl:  .  atorvastatin (LIPITOR) 10 MG tablet, TAKE ONE TABLET BY MOUTH ONCE DAILY, Disp: 90 tablet, Rfl: 3 .  Cholecalciferol (VITAMIN D3) 2000 units TABS, Take 2,000 Units by mouth 3 (three) times a week., Disp: , Rfl:  .  cyanocobalamin (,VITAMIN B-12,) 1000 MCG/ML injection, 1 ml injected IM weekly x 4 ,  Then monthly thereafter, Disp: 10 mL, Rfl: 2 .  diazepam (VALIUM) 5 MG tablet, Take 1 tablet (5 mg total) by mouth every 12 (twelve) hours as needed for anxiety., Disp: 30 tablet, Rfl: 0 .  metoprolol tartrate (LOPRESSOR) 25 MG tablet, TAKE 1 TABLET BY MOUTH TWICE DAILY, Disp: 180 tablet, Rfl: 3 .  nitroGLYCERIN (NITROSTAT) 0.4 MG SL tablet, Place 1 tablet (0.4 mg total) under the tongue every 5 (five) minutes as needed for chest pain., Disp: 25 tablet, Rfl: 3 .  Syringe/Needle, Disp, (SYRINGE 3CC/25GX1") 25G X 1" 3 ML MISC, Use for b12 injections, Disp: 50 each, Rfl: 0 .  Turmeric 500 MG CAPS, Take 500 mg by mouth 2 (two) times daily., Disp: , Rfl:  .  DULoxetine (CYMBALTA) 30 MG capsule, Take 1 capsule (30 mg total) by mouth daily., Disp: 30 capsule, Rfl: 0  Physical exam:  Vitals:   06/28/18 1437  BP: 135/75  Pulse: 76  Resp: 18  Temp: (!) 97.3 F (36.3 C)  TempSrc: Tympanic  Weight: 165 lb (74.8 kg)   Physical Exam HENT:     Head: Normocephalic and atraumatic.  Eyes:     Pupils: Pupils are equal, round, and reactive to light.  Neck:     Musculoskeletal: Normal  range of motion.  Cardiovascular:     Rate and Rhythm: Normal rate and regular rhythm.     Heart sounds: Normal heart sounds.  Pulmonary:     Effort: Pulmonary effort is normal.     Breath sounds: Normal breath sounds.  Abdominal:     General: Bowel sounds are normal. There  is no distension.     Palpations: Abdomen is soft.     Tenderness: There is no abdominal tenderness.  Skin:    General: Skin is warm and dry.  Neurological:     Mental Status: She is alert and oriented to person, place, and time.      CMP Latest Ref Rng & Units 06/28/2018  Glucose 70 - 99 mg/dL 92  BUN 8 - 23 mg/dL 27(H)  Creatinine 0.44 - 1.00 mg/dL 1.69(H)  Sodium 135 - 145 mmol/L 144  Potassium 3.5 - 5.1 mmol/L 4.4  Chloride 98 - 111 mmol/L 109  CO2 22 - 32 mmol/L 27  Calcium 8.9 - 10.3 mg/dL 9.1  Total Protein 6.5 - 8.1 g/dL 6.9  Total Bilirubin 0.3 - 1.2 mg/dL 1.1  Alkaline Phos 38 - 126 U/L 76  AST 15 - 41 U/L 26  ALT 0 - 44 U/L 18   CBC Latest Ref Rng & Units 06/28/2018  WBC 4.0 - 10.5 K/uL 5.8  Hemoglobin 12.0 - 15.0 g/dL 12.0  Hematocrit 36.0 - 46.0 % 37.5  Platelets 150 - 400 K/uL 237      Assessment and plan- Patient is a 68 y.o. female h/o Stage I high risk serous endometrial cancer s/p 3 cyclesof carbo/taxoland WPRT.  She is here for routine follow-up of her endometrial cancer  Clinically she is doing well and there is no evidence of recurrence on today's exam.  She also follows up with Dr. [name] regularly for her pelvic exams.  Ca1 25 is normal.  Chemo-induced peripheral neuropathy: Her neuropathy symptoms are mainly in her hands.  It is unclear if her leg cramps are related to her neuropathy.  She has seen vascular surgery in the past and I will refer her back to them to see if there is any evidence of peripheral vascular disease is contributing to her leg cramps.  From peripheral neuropathy standpoint she has tried Cymbalta in the past but reports that she had some episodes of  hypotension when she was taking 60 mg.  I asked her if she would be willing to try a lower dose of 30 mg and she agrees.  I will go ahead and prescribe her the same today.  I will see her back in 6 months time with CBC CMP and a Ca1 25   Visit Diagnosis 1. Encounter for follow-up surveillance of endometrial cancer   2. Leg cramping   3. Chemotherapy-induced peripheral neuropathy ([place])      Dr. Randa Evens, MD, MPH [facility] at [facility] [phone] 06/30/2018 9:52 AM

## 2018-07-01 ENCOUNTER — Telehealth: Payer: Self-pay | Admitting: *Deleted

## 2018-07-01 NOTE — Telephone Encounter (Signed)
Put in ambulatory referral and faxed a referral request with Dr Elroy Channel last clinic note to Dr Ronalee Belts at [facility] for leg cramps. Patient has seen Dr Ronalee Belts in the past.

## 2018-07-04 ENCOUNTER — Ambulatory Visit (INDEPENDENT_AMBULATORY_CARE_PROVIDER_SITE_OTHER): Payer: PPO | Admitting: Vascular Surgery

## 2018-07-04 ENCOUNTER — Other Ambulatory Visit: Payer: Self-pay

## 2018-07-04 ENCOUNTER — Encounter (INDEPENDENT_AMBULATORY_CARE_PROVIDER_SITE_OTHER): Payer: Self-pay | Admitting: Vascular Surgery

## 2018-07-04 VITALS — BP 107/52 | HR 69 | Resp 18 | Ht 61.0 in | Wt 166.0 lb

## 2018-07-04 DIAGNOSIS — I2510 Atherosclerotic heart disease of native coronary artery without angina pectoris: Secondary | ICD-10-CM | POA: Diagnosis not present

## 2018-07-04 DIAGNOSIS — I739 Peripheral vascular disease, unspecified: Secondary | ICD-10-CM

## 2018-07-04 DIAGNOSIS — R252 Cramp and spasm: Secondary | ICD-10-CM

## 2018-07-04 DIAGNOSIS — I872 Venous insufficiency (chronic) (peripheral): Secondary | ICD-10-CM | POA: Diagnosis not present

## 2018-07-04 DIAGNOSIS — E7800 Pure hypercholesterolemia, unspecified: Secondary | ICD-10-CM | POA: Diagnosis not present

## 2018-07-04 DIAGNOSIS — Z87891 Personal history of nicotine dependence: Secondary | ICD-10-CM

## 2018-07-07 DIAGNOSIS — C541 Malignant neoplasm of endometrium: Secondary | ICD-10-CM | POA: Diagnosis not present

## 2018-07-08 ENCOUNTER — Encounter: Payer: Self-pay | Admitting: *Deleted

## 2018-07-10 ENCOUNTER — Encounter (INDEPENDENT_AMBULATORY_CARE_PROVIDER_SITE_OTHER): Payer: Self-pay | Admitting: Vascular Surgery

## 2018-07-10 NOTE — Progress Notes (Signed)
MRN : [id number]  Jaime Hall is a 68 y.o. (1951/05/10) female who presents with chief complaint of  Chief Complaint  Patient presents with  . New Patient (Initial Visit)    Bilateral lower extremity cramping  .  History of Present Illness:   The patient is seen for evaluation of painful lower extremities. The pain occurs primarily at night while the patient is in bed but it also occurs frequently during the day.   The patient describes it as a cramping or Charley horse type pain. The patient notes the pain isn't associated with activity and is not very consistent day to day. The pain seems to be variable with time. Typically the pain occurs with varying positions and seems to progress until the leg is stretched. The pain has been progressive over the past several years which has prompted the concern for evaluation. The patient states this inability to walk has a significant negative impact on her quality of life and daily activities.  The patient denies a history of degenerative spine disease.  The patient denies rest pain. The patient denies dangling off the affected extremity during the night for pain relief. There are no open wounds or sores at this time.  No prior vascular interventions or vascular surgeries.  The patient denies amaurosis fugax or recent TIA symptoms. There are no recent neurological changes noted. The patient denies history of DVT, PE or superficial thrombophlebitis. The patient denies recent episodes of angina or shortness of breath.    Current Meds  Medication Sig  . acetaminophen (TYLENOL) 500 MG tablet Take 1,000 mg by mouth every 6 (six) hours as needed for mild pain.   Marland Kitchen aspirin EC 81 MG tablet Take 81 mg by mouth at bedtime.  Marland Kitchen atorvastatin (LIPITOR) 10 MG tablet TAKE ONE TABLET BY MOUTH ONCE DAILY  . Cholecalciferol (VITAMIN D3) 2000 units TABS Take 2,000 Units by mouth 3 (three) times a week.  . cyanocobalamin (,VITAMIN B-12,) 1000 MCG/ML  injection 1 ml injected IM weekly x 4 ,  Then monthly thereafter  . diazepam (VALIUM) 5 MG tablet Take 1 tablet (5 mg total) by mouth every 12 (twelve) hours as needed for anxiety.  . DULoxetine (CYMBALTA) 30 MG capsule Take 1 capsule (30 mg total) by mouth daily.  . metoprolol tartrate (LOPRESSOR) 25 MG tablet TAKE 1 TABLET BY MOUTH TWICE DAILY  . nitroGLYCERIN (NITROSTAT) 0.4 MG SL tablet Place 1 tablet (0.4 mg total) under the tongue every 5 (five) minutes as needed for chest pain.  . Syringe/Needle, Disp, (SYRINGE 3CC/25GX1") 25G X 1" 3 ML MISC Use for b12 injections  . Turmeric 500 MG CAPS Take 500 mg by mouth 2 (two) times daily.    Past Medical History:  Diagnosis Date  . Anxiety   . Cervicalgia   . Coronary artery disease    a. 02/2012 Stress echo: severe anterior wall ischemia;  b. 02/2012 Cath/PCI: LAD 95p (3.0 x 15 Xience EX DES), D1 90ost (PTCA - bifurcational dzs), EF 45% with anterior HK;  b. 02/2013 Ex MV: fixed anterior defect w/ minor reversibility, nl EF-->Med Rx.  . Endometrial cancer ([place])    a. 07/2016 s/p robotic hysterectomy, BSO w/ washings, sentinel node inj, mapping, bx, adhesiolysis.  . Essential hypertension, benign   . Fibrocystic breast disease   . GERD (gastroesophageal reflux disease)   . Gestational hypertension   . Heart murmur   . History of anemia   . History of blood transfusion   .  Hodgkin's lymphoma ([place]) 2011   a. s/p radiation and chemo therapy  . Osteoarthritis   . Polycystic ovarian disease     Past Surgical History:  Procedure Laterality Date  . ABDOMINAL HYSTERECTOMY    . bladder sling    . CARDIAC CATHETERIZATION  02/2012   [facility] 1 stent place  . CERVICAL POLYPECTOMY    . CHOLECYSTECTOMY  1982  . COLONOSCOPY WITH PROPOFOL N/A 02/05/2015   Procedure: COLONOSCOPY WITH PROPOFOL;  Surgeon: Lucilla Lame, MD;  Location: [facility];  Service: Endoscopy;  Laterality: N/A;  . CORONARY ANGIOPLASTY  02/2012   left/right s/p balloon  . CYSTOGRAM  N/A 08/17/2016   Procedure: CYSTOGRAM;  Surgeon: Hollice Espy, MD;  Location: [facility];  Service: Urology;  Laterality: N/A;  . CYSTOSCOPY N/A 08/17/2016   Procedure: CYSTOSCOPY EXAM UNDER ANESTHESIA;  Surgeon: Hollice Espy, MD;  Location: [facility];  Service: Urology;  Laterality: N/A;  . CYSTOSCOPY W/ RETROGRADES Bilateral 08/17/2016   Procedure: CYSTOSCOPY WITH RETROGRADE PYELOGRAM;  Surgeon: Hollice Espy, MD;  Location: [facility];  Service: Urology;  Laterality: Bilateral;  . CYSTOSCOPY WITH STENT PLACEMENT Right 08/17/2016   Procedure: CYSTOSCOPY WITH STENT PLACEMENT;  Surgeon: Hollice Espy, MD;  Location: [facility];  Service: Urology;  Laterality: Right;  . heart stent'  2013  . kidney stent Right 2018  . LYMPH NODE BIOPSY  2011   diagnosis of hodgkins lymphoma  . PELVIC LYMPH NODE DISSECTION N/A 07/29/2016   Procedure: PELVIC/AORTIC LYMPH NODE SAMPLING;  Surgeon: [name], MD;  Location: [facility];  Service: Gynecology;  Laterality: N/A;  . PORTA CATH INSERTION N/A 09/22/2016   Procedure: Glori Luis Cath Insertion;  Surgeon: [name], MD;  Location: [place] [facility];  Service: Cardiovascular;  Laterality: N/A;  . PORTA CATH REMOVAL N/A 11/17/2016   Procedure: Glori Luis Cath Removal;  Surgeon: [name], MD;  Location: [place] [facility];  Service: Cardiovascular;  Laterality: N/A;  . ROBOTIC ASSISTED TOTAL HYSTERECTOMY WITH BILATERAL SALPINGO OOPHERECTOMY N/A 07/29/2016   Procedure: ROBOTIC ASSISTED TOTAL HYSTERECTOMY WITH BILATERAL SALPINGO OOPHORECTOMY;  Surgeon: [name], MD;  Location: [facility];  Service: Gynecology;  Laterality: N/A;  . SENTINEL NODE BIOPSY N/A 07/29/2016   Procedure: SENTINEL NODE BIOPSY;  Surgeon: [name], MD;  Location: [facility];  Service: Gynecology;  Laterality: N/A;  . transobturator sling N/A 2009   [place] History Social History   Tobacco Use  . Smoking status: Former Smoker    Packs/day:  1.00    Years: 30.00    Pack years: 30.00    Types: Cigarettes    Last attempt to quit: 07/24/2002    Years since quitting: 15.9  . Smokeless tobacco: Never Used  . Tobacco comment: quit smoking in 2000  Substance Use Topics  . Alcohol use: Yes    Comment: occasional, 1-2 drinks per month  . Drug use: No    Family History Family History  Problem Relation Age of Onset  . ALS Father   . Polymyositis Father   . Diabetes Brother   . Cancer Maternal Aunt        breast  . Breast cancer Maternal Aunt        30's  . Stroke Maternal Grandmother   . Cancer Maternal Grandfather        prostate  . Stroke Maternal Grandfather   . Colon cancer Maternal Aunt   . Non-Hodgkin's lymphoma Cousin   No family history  of bleeding/clotting disorders, porphyria or autoimmune disease   Allergies  Allergen Reactions  . Adhesive [Tape] Other (See Comments)    Burns the skin  . Z-Pak [Azithromycin] Itching  . Antifungal [Miconazole Nitrate] Rash  . Sulfa Antibiotics Nausea And Vomiting and Rash    N/T  . Tolnaftate Rash     REVIEW OF SYSTEMS (Negative unless checked)  Constitutional: [] Weight loss  [] Fever  [] Chills Cardiac: [] Chest pain   [] Chest pressure   [] Palpitations   [] Shortness of breath when laying flat   [] Shortness of breath with exertion. Vascular:  [] Pain in legs with walking   [] Pain in legs at rest  [] History of DVT   [] Phlebitis   [] Swelling in legs   [] Varicose veins   [] Non-healing ulcers Pulmonary:   [] Uses home oxygen   [] Productive cough   [] Hemoptysis   [] Wheeze  [] COPD   [] Asthma Neurologic:  [] Dizziness   [] Seizures   [] History of stroke   [] History of TIA  [] Aphasia   [] Vissual changes   [] Weakness or numbness in arm   [] Weakness or numbness in leg Musculoskeletal:   [] Joint swelling   [] Joint pain   [] Low back pain Hematologic:  [] Easy bruising  [] Easy bleeding   [] Hypercoagulable state   [] Anemic Gastrointestinal:  [] Diarrhea   [] Vomiting  [] Gastroesophageal  reflux/heartburn   [] Difficulty swallowing. Genitourinary:  [] Chronic kidney disease   [] Difficult urination  [] Frequent urination   [] Blood in urine Skin:  [] Rashes   [] Ulcers  Psychological:  [] History of anxiety   []  History of major depression.  Physical Examination  Vitals:   07/04/18 1448  BP: (!) 107/52  Pulse: 69  Resp: 18  Weight: 166 lb (75.3 kg)  Height: 5\' 1"  (1.549 m)   Body mass index is 31.37 kg/m. Gen: WD/WN, NAD Head: [place]/AT, No temporalis wasting.  Ear/Nose/Throat: Hearing grossly intact, nares w/o erythema or drainage, poor dentition Eyes: PER, EOMI, sclera nonicteric.  Neck: Supple, no masses.  No bruit or JVD.  Pulmonary:  Good air movement, clear to auscultation bilaterally, no use of accessory muscles.  Cardiac: RRR, normal S1, S2, no Murmurs. Vascular: scattered varicosities present bilaterally.  Mild venous stasis changes to the legs bilaterally.  2+ soft pitting edema Vessel Right Left  Radial Palpable Palpable  PT Trace Palpable Trace Palpable  DP Trace Palpable Trace Palpable  Gastrointestinal: soft, non-distended. No guarding/no peritoneal signs.  Musculoskeletal: M/S 5/5 throughout.  No deformity or atrophy.  Neurologic: CN 2-12 intact. Pain and light touch intact in extremities.  Symmetrical.  Speech is fluent. Motor exam as listed above. Psychiatric: Judgment intact, Mood & affect appropriate for pt's clinical situation. Dermatologic: mild venous rashes no ulcers noted.  No changes consistent with cellulitis. Lymph : No Cervical lymphadenopathy, no lichenification or skin changes of chronic lymphedema.  CBC Lab Results  Component Value Date   WBC 5.8 06/28/2018   HGB 12.0 06/28/2018   HCT 37.5 06/28/2018   MCV 89.3 06/28/2018   PLT 237 06/28/2018    BMET    Component Value Date/Time   NA 144 06/28/2018 1407   NA 141 01/13/2018   NA 142 03/27/2014 1214   K 4.4 06/28/2018 1407   K 4.6 03/27/2014 1214   CL 109 06/28/2018 1407   CL 107  03/27/2014 1214   CO2 27 06/28/2018 1407   CO2 33 (H) 03/27/2014 1214   GLUCOSE 92 06/28/2018 1407   GLUCOSE 77 03/27/2014 1214   BUN 27 (H) 06/28/2018 1407   BUN 16  01/13/2018   BUN 14 03/27/2014 1214   CREATININE 1.69 (H) 06/28/2018 1407   CREATININE 1.04 03/27/2014 1214   CALCIUM 9.1 06/28/2018 1407   CALCIUM 8.7 03/27/2014 1214   GFRNONAA 31 (L) 06/28/2018 1407   GFRNONAA 57 (L) 03/27/2014 1214   GFRNONAA 43 (L) 07/21/2013 1429   GFRAA 36 (L) 06/28/2018 1407   GFRAA >60 03/27/2014 1214   GFRAA 50 (L) 07/21/2013 1429   Estimated Creatinine Clearance: 29.6 mL/min (A) (by C-G formula based on SCr of 1.69 mg/dL (H)).  COAG Lab Results  Component Value Date   INR 1.02 08/05/2016   INR 0.92 07/24/2016   INR 1.0 07/13/2016    Radiology No results found.     Assessment/Plan 1. Leg cramps Recommend:  The patient is describing Charley horse type leg cramps. No invasive studies, angiography or surgery at this time.    I have reviewed homeopathic remedies such as Cider vinegar or mustard; placing a bar of soap at the bottom of the bed. Quinine is also an option Magnesium supplementation at bedtime was also reviewed.  The patient should continue walking and begin a more formal exercise program.  The patient should continue antiplatelet therapy and aggressive treatment of the lipid abnormalities  The patient should continue wearing graduated compression socks 10-15 mmHg strength to control any mild edema.  The patient will follow up with me on a PRN basis.   2. PAD (peripheral artery disease) (HCC) Recommend:  I do not find evidence of Vascular pathology that would explain the patient's symptoms  The patient has atypical pain symptoms for vascular disease  The patient should continue walking and begin a more formal exercise program. The patient should continue his antiplatelet therapy and aggressive treatment of the lipid abnormalities. The patient should begin  wearing graduated compression socks 15-20 mmHg strength to control her mild edema.  Patient will follow-up with me on a PRN basis  Further work-up of her lower extremity pain is deferred to the primary service     3. Chronic venous insufficiency No surgery or intervention at this point in time.    I have had a long discussion with the patient regarding venous insufficiency and why it  causes symptoms. I have discussed with the patient the chronic skin changes that accompany venous insufficiency and the long term sequela such as infection and ulceration.  Patient will begin wearing graduated compression stockings class 1 (20-30 mmHg) or compression wraps on a daily basis a prescription was given. The patient will put the stockings on first thing in the morning and removing them in the evening. The patient is instructed specifically not to sleep in the stockings.    In addition, behavioral modification including several periods of elevation of the lower extremities during the day will be continued. I have demonstrated that proper elevation is a position with the ankles at heart level.  The patient is instructed to begin routine exercise, especially walking on a daily basis   4. Coronary artery disease involving native coronary artery of native heart without angina pectoris Continue cardiac and antihypertensive medications as already ordered and reviewed, no changes at this time.  Continue statin as ordered and reviewed, no changes at this time  Nitrates PRN for chest pain   5. Pure hypercholesterolemia Continue statin as ordered and reviewed, no changes at this time     Hortencia Pilar, MD  07/10/2018 2:15 PM

## 2018-08-02 ENCOUNTER — Other Ambulatory Visit: Payer: Self-pay | Admitting: Oncology

## 2018-08-08 DIAGNOSIS — I10 Essential (primary) hypertension: Secondary | ICD-10-CM | POA: Diagnosis not present

## 2018-08-08 DIAGNOSIS — N281 Cyst of kidney, acquired: Secondary | ICD-10-CM | POA: Diagnosis not present

## 2018-08-08 DIAGNOSIS — N183 Chronic kidney disease, stage 3 (moderate): Secondary | ICD-10-CM | POA: Diagnosis not present

## 2018-08-08 LAB — BASIC METABOLIC PANEL
Creatinine: 1.1 (ref <=1.1)
Potassium: 4.5 (ref 3.4–5.3)
Sodium: 141 (ref 137–147)

## 2018-08-08 LAB — CBC AND DIFFERENTIAL
Hemoglobin: 14.1 (ref 12.0–16.0)
Hemoglobin: 5.2 — AB (ref 12.0–16.0)
Platelets: 237 (ref 150–399)
WBC: 5.2

## 2018-08-08 LAB — HEMOGLOBIN A1C: Hemoglobin A1C: 14.1

## 2018-08-11 DIAGNOSIS — I129 Hypertensive chronic kidney disease with stage 1 through stage 4 chronic kidney disease, or unspecified chronic kidney disease: Secondary | ICD-10-CM | POA: Diagnosis not present

## 2018-08-11 DIAGNOSIS — N183 Chronic kidney disease, stage 3 (moderate): Secondary | ICD-10-CM | POA: Diagnosis not present

## 2018-08-11 DIAGNOSIS — N281 Cyst of kidney, acquired: Secondary | ICD-10-CM | POA: Diagnosis not present

## 2018-08-30 ENCOUNTER — Other Ambulatory Visit: Payer: Self-pay | Admitting: Cardiovascular Disease

## 2018-08-30 ENCOUNTER — Other Ambulatory Visit: Payer: Self-pay | Admitting: Oncology

## 2018-09-01 ENCOUNTER — Encounter: Payer: Self-pay | Admitting: *Deleted

## 2018-09-05 ENCOUNTER — Ambulatory Visit: Payer: PPO | Admitting: Radiation Oncology

## 2018-09-14 ENCOUNTER — Ambulatory Visit: Payer: PPO | Admitting: Radiation Oncology

## 2018-09-22 ENCOUNTER — Telehealth: Payer: Self-pay | Admitting: *Deleted

## 2018-09-22 ENCOUNTER — Other Ambulatory Visit: Payer: Self-pay | Admitting: *Deleted

## 2018-09-22 MED ORDER — DULOXETINE HCL 30 MG PO CPEP: 30.0000 mg | ORAL_CAPSULE | Freq: Every day | ORAL | 1 refills | Status: DC

## 2018-09-22 NOTE — Telephone Encounter (Signed)
Pt. Called for refill of cymbalta and wanted to know if she could get a 90 day supply to keep her from going to pharmacy as often due to coronavirus.  Dr. Janese Banks said yes and I sent rx in for 90 day supply. I called patient back and no answer on home phone so I called her cell phone and left message that it was refilled for 90 day supply

## 2018-10-14 ENCOUNTER — Ambulatory Visit: Payer: PPO | Admitting: Internal Medicine

## 2018-10-26 ENCOUNTER — Ambulatory Visit: Payer: PPO | Admitting: Radiation Oncology

## 2018-11-14 ENCOUNTER — Other Ambulatory Visit: Payer: Self-pay

## 2018-11-14 ENCOUNTER — Encounter: Payer: Self-pay | Admitting: Internal Medicine

## 2018-11-14 ENCOUNTER — Ambulatory Visit (INDEPENDENT_AMBULATORY_CARE_PROVIDER_SITE_OTHER): Payer: PPO | Admitting: Internal Medicine

## 2018-11-14 ENCOUNTER — Ambulatory Visit (INDEPENDENT_AMBULATORY_CARE_PROVIDER_SITE_OTHER): Payer: PPO

## 2018-11-14 DIAGNOSIS — E669 Obesity, unspecified: Secondary | ICD-10-CM

## 2018-11-14 DIAGNOSIS — E7800 Pure hypercholesterolemia, unspecified: Secondary | ICD-10-CM | POA: Diagnosis not present

## 2018-11-14 DIAGNOSIS — R252 Cramp and spasm: Secondary | ICD-10-CM

## 2018-11-14 DIAGNOSIS — Z7189 Other specified counseling: Secondary | ICD-10-CM | POA: Diagnosis not present

## 2018-11-14 DIAGNOSIS — Z0000 Encounter for general adult medical examination without abnormal findings: Secondary | ICD-10-CM

## 2018-11-14 NOTE — Progress Notes (Signed)
Virtual Visit converted to telephone  Note  This visit type was conducted due to national recommendations for restrictions regarding the COVID-19 pandemic (e.g. social distancing).  This format is felt to be most appropriate for this patient at this time.  All issues noted in this document were discussed and addressed.  No physical exam was performed (except for noted visual exam findings with Video Visits).    I attempted to connect with@ on 11/09/18 at  2:30 PM EDT by a video enabled telemedicine application ; however Interactive audio and video telecommunications  Ultimately failed, due to patient having technical difficulties.   We continued and completed visit with audio only. I verified that I am speaking with the correct person using two identifiers.  Location patient: home Location provider: work or home office Persons participating in the virtual visit: patient, provider  I discussed the limitations, risks, security and privacy concerns of performing an evaluation and management service by telephone and the availability of in person appointments. I also discussed with the patient that there may be a patient responsible charge related to this service. The patient expressed understanding and agreed to proceed.  Reason for visit: follow up   HPI:  The patient has no signs or symptoms of COVID 19 infection (fever, cough, sore throat  or shortness of breath beyond what is typical for patient).  Patient denies contact with other persons with the above mentioned symptoms or with anyone confirmed to have COVID 19   Follow up  On leg cramps. Occurring nocturnally , managed with mustard   Scalp itching a lot.  Has tried shampoo for "itchy head: has not helped.  Several months .symptoms are worse at night , not during the day .  No dandruff.  Works in the [employer],  In [employer].  Taking low dose  lipitor daily  For maintaining patency of cardiac stent , and tolerating it.   ROS:  See pertinent positives and negatives per HPI.  Past Medical History:  Diagnosis Date  . Anxiety   . Cervicalgia   . Coronary artery disease    a. 02/2012 Stress echo: severe anterior wall ischemia;  b. 02/2012 Cath/PCI: LAD 95p (3.0 x 15 Xience EX DES), D1 90ost (PTCA - bifurcational dzs), EF 45% with anterior HK;  b. 02/2013 Ex MV: fixed anterior defect w/ minor reversibility, nl EF-->Med Rx.  . Endometrial cancer ([place])    a. 07/2016 s/p robotic hysterectomy, BSO w/ washings, sentinel node inj, mapping, bx, adhesiolysis.  . Essential hypertension, benign   . Fibrocystic breast disease   . GERD (gastroesophageal reflux disease)   . Gestational hypertension   . Heart murmur   . History of anemia   . History of blood transfusion   . Hodgkin's lymphoma ([place]) 2011   a. s/p radiation and chemo therapy  . Osteoarthritis   . Polycystic ovarian disease     Past Surgical History:  Procedure Laterality Date  . ABDOMINAL HYSTERECTOMY    . bladder sling    . CARDIAC CATHETERIZATION  02/2012   [facility] 1 stent place  . CERVICAL POLYPECTOMY    . CHOLECYSTECTOMY  1982  . COLONOSCOPY WITH PROPOFOL N/A 02/05/2015   Procedure: COLONOSCOPY WITH PROPOFOL;  Surgeon: Lucilla Lame, MD;  Location: [facility];  Service: Endoscopy;  Laterality: N/A;  . CORONARY ANGIOPLASTY  02/2012   left/right s/p balloon  . CYSTOGRAM N/A 08/17/2016   Procedure: CYSTOGRAM;  Surgeon: Hollice Espy, MD;  Location: [facility];  Service:  Urology;  Laterality: N/A;  . CYSTOSCOPY N/A 08/17/2016   Procedure: CYSTOSCOPY EXAM UNDER ANESTHESIA;  Surgeon: Hollice Espy, MD;  Location: [facility];  Service: Urology;  Laterality: N/A;  . CYSTOSCOPY W/ RETROGRADES Bilateral 08/17/2016   Procedure: CYSTOSCOPY WITH RETROGRADE PYELOGRAM;  Surgeon: Hollice Espy, MD;  Location: [facility];  Service: Urology;  Laterality: Bilateral;  . CYSTOSCOPY WITH STENT PLACEMENT Right 08/17/2016   Procedure: CYSTOSCOPY WITH STENT PLACEMENT;  Surgeon: Hollice Espy, MD;  Location: [facility];  Service: Urology;  Laterality: Right;  . heart stent'  2013  . kidney stent Right 2018  . LYMPH NODE BIOPSY  2011   diagnosis of hodgkins lymphoma  . PELVIC LYMPH NODE DISSECTION N/A 07/29/2016   Procedure: PELVIC/AORTIC LYMPH NODE SAMPLING;  Surgeon: [name], MD;  Location: [facility];  Service: Gynecology;  Laterality: N/A;  . PORTA CATH INSERTION N/A 09/22/2016   Procedure: Glori Luis Cath Insertion;  Surgeon: [name], MD;  Location: [place] [facility];  Service: Cardiovascular;  Laterality: N/A;  . PORTA CATH REMOVAL N/A 11/17/2016   Procedure: Glori Luis Cath Removal;  Surgeon: [name], MD;  Location: [place] [facility];  Service: Cardiovascular;  Laterality: N/A;  . ROBOTIC ASSISTED TOTAL HYSTERECTOMY WITH BILATERAL SALPINGO OOPHERECTOMY N/A 07/29/2016   Procedure: ROBOTIC ASSISTED TOTAL HYSTERECTOMY WITH BILATERAL SALPINGO OOPHORECTOMY;  Surgeon: [name], MD;  Location: [facility];  Service: Gynecology;  Laterality: N/A;  . SENTINEL NODE BIOPSY N/A 07/29/2016   Procedure: SENTINEL NODE BIOPSY;  Surgeon: [name], MD;  Location: [facility];  Service: Gynecology;  Laterality: N/A;  . transobturator sling N/A 2009   [place]    Family History  Problem Relation Age of Onset  . ALS Father   . Polymyositis Father   . Diabetes Brother   . Cancer Maternal Aunt        breast  . Breast cancer Maternal Aunt        30's  . Stroke Maternal Grandmother   . Cancer Maternal Grandfather        prostate  . Stroke Maternal Grandfather   . Colon cancer Maternal Aunt   . Non-Hodgkin's lymphoma Cousin     SOCIAL HX:  Social History   Tobacco Use  . Smoking status: Former Smoker    Packs/day: 1.00    Years: 30.00    Pack years: 30.00    Types: Cigarettes    Last attempt to quit: 07/24/2002    Years since quitting: 16.3  . Smokeless tobacco: Never Used  . Tobacco comment: quit smoking in 2000  Substance  Use Topics  . Alcohol use: Yes    Comment: occasional, 1-2 drinks per month  . Drug use: No     Current Outpatient Medications:  .  acetaminophen (TYLENOL) 500 MG tablet, Take 1,000 mg by mouth every 6 (six) hours as needed for mild pain. , Disp: , Rfl:  .  aspirin EC 81 MG tablet, Take 81 mg by mouth at bedtime., Disp: , Rfl:  .  atorvastatin (LIPITOR) 10 MG tablet, Take 1 tablet by mouth once daily, Disp: 90 tablet, Rfl: 2 .  Cholecalciferol (VITAMIN D3) 2000 units TABS, Take 2,000 Units by mouth 3 (three) times a week., Disp: , Rfl:  .  cyanocobalamin (,VITAMIN B-12,) 1000 MCG/ML injection, 1 ml injected IM weekly x 4 ,  Then monthly thereafter, Disp: 10 mL, Rfl: 2 .  diazepam (VALIUM) 5 MG tablet, Take 1 tablet (5 mg total)  by mouth every 12 (twelve) hours as needed for anxiety., Disp: 30 tablet, Rfl: 0 .  DULoxetine (CYMBALTA) 30 MG capsule, Take 1 capsule (30 mg total) by mouth daily., Disp: 90 capsule, Rfl: 1 .  metoprolol tartrate (LOPRESSOR) 25 MG tablet, TAKE 1 TABLET BY MOUTH TWICE DAILY, Disp: 180 tablet, Rfl: 3 .  nitroGLYCERIN (NITROSTAT) 0.4 MG SL tablet, Place 1 tablet (0.4 mg total) under the tongue every 5 (five) minutes as needed for chest pain., Disp: 25 tablet, Rfl: 3 .  Syringe/Needle, Disp, (SYRINGE 3CC/25GX1") 25G X 1" 3 ML MISC, Use for b12 injections, Disp: 50 each, Rfl: 0 .  Turmeric 500 MG CAPS, Take 500 mg by mouth 2 (two) times daily., Disp: , Rfl:   EXAM:  General impression: alert, cooperative and articulate.  No signs of being in distress  Lungs: speech is fluent sentence length suggests that patient is not short of breath and not punctuated by cough, sneezing or sniffing. Marland Kitchen   Psych: affect normal.  speech is articulate and non pressured .  Denies suicidal thoughts   ASSESSMENT AND PLAN:  Discussed the following assessment and plan:  Pure hypercholesterolemia - Plan: Lipid panel, Comprehensive metabolic panel, Comprehensive metabolic panel, Lipid  panel  Obesity (BMI 30-39.9)  Leg cramps  Educated About Covid-19 Virus Infection  Hyperlipidemia Managed with  High potency statin ,  Low dose.   LDL and triglycerides are at goal on current medications. She  has no side effects and liver enzymes are normal. No changes today  Lab Results  Component Value Date   CHOL 109 07/16/2017   HDL 45.10 07/16/2017   LDLCALC 38 07/16/2017   TRIG 128.0 07/16/2017   CHOLHDL 2 07/16/2017   Lab Results  Component Value Date   ALT 18 06/28/2018   AST 26 06/28/2018   ALKPHOS 76 06/28/2018   BILITOT 1.1 06/28/2018           Obesity (BMI 30-39.9) I have addressed  BMI and recommended wt loss of 10% of body weight over the next 6 months using a low fat, low starch, high protein  fruit/vegetable based Mediterranean diet and 30 minutes of aerobic exercise a minimum of 5 days per week.    Leg cramps Etiology unclear. Continued despite suspension of statin so she has resumed it.  Sodium, potassium, mg, ionized calcium. Thyroid and ck are all normal.   Resolve with mustard (tuermeric)    Educated About Covid-19 Virus Infection Educated patient on the newly broadened list of signs and symptoms of COVID-19 infection and ways to avoid the viral infection including washing hands frequently with soap and water,  using hand sanitizer if unable to wash, avoiding touching face,  staying at home and limiting visitors,  and avoiding contact with people coming in and out of home.  Discussed the potential ineffectiveness of hand sanitizer if left in environments > 110 degrees (ie , the car).  Reminded patient to call office with questions/concerns.  The importance of social distancing was discussed today    I discussed the assessment and treatment plan with the patient. The patient was provided an opportunity to ask questions and all were answered. The patient agreed with the plan and demonstrated an understanding of the instructions.   The patient was  advised to call back or seek an in-person evaluation if the symptoms worsen or if the condition fails to improve as anticipated.  I provided 25 minutes of non-face-to-face time during this encounter.   Aris Everts  Derrel Nip, MD

## 2018-11-14 NOTE — Progress Notes (Addendum)
Subjective:   Jaime Hall is a 68 y.o. female who presents for Medicare Annual (Subsequent) preventive examination.  Review of Systems:  No ROS.  Medicare Wellness Virtual Visit.  Visual/audio telehealth visit, UTA vital signs.   See social history for additional risk factors. Cardiac Risk Factors include: advanced age (>66men, >38 women)    Objective:     Vitals: There were no vitals taken for this visit.  There is no height or weight on file to calculate BMI.  Advanced Directives 11/14/2018 06/28/2018 03/30/2018 12/23/2017 11/10/2017 09/29/2017 09/06/2017  Does Patient Have a Medical Advance Directive? No No No No No No No  Does patient want to make changes to medical advance directive? - - - - - - -  Would patient like information on creating a medical advance directive? Yes (MAU/Ambulatory/Procedural Areas - Information given) No - Patient declined No - Patient declined No - Patient declined No - Patient declined No - Patient declined No - Patient declined    Tobacco Social History   Tobacco Use  Smoking Status Former Smoker  . Packs/day: 1.00  . Years: 30.00  . Pack years: 30.00  . Types: Cigarettes  . Last attempt to quit: 07/24/2002  . Years since quitting: 16.3  Smokeless Tobacco Never Used  Tobacco Comment   quit smoking in 2000     Counseling given: Not Answered Comment: quit smoking in 2000   Clinical Intake:  Pre-visit preparation completed: Yes           How often do you need to have someone help you when you read instructions, pamphlets, or other written materials from your doctor or pharmacy?: 1 - Never  Interpreter Needed?: No     Past Medical History:  Diagnosis Date  . Anxiety   . Cervicalgia   . Coronary artery disease    a. 02/2012 Stress echo: severe anterior wall ischemia;  b. 02/2012 Cath/PCI: LAD 95p (3.0 x 15 Xience EX DES), D1 90ost (PTCA - bifurcational dzs), EF 45% with anterior HK;  b. 02/2013 Ex MV: fixed anterior defect w/ minor  reversibility, nl EF-->Med Rx.  . Endometrial cancer ([place])    a. 07/2016 s/p robotic hysterectomy, BSO w/ washings, sentinel node inj, mapping, bx, adhesiolysis.  . Essential hypertension, benign   . Fibrocystic breast disease   . GERD (gastroesophageal reflux disease)   . Gestational hypertension   . Heart murmur   . History of anemia   . History of blood transfusion   . Hodgkin's lymphoma ([place]) 2011   a. s/p radiation and chemo therapy  . Osteoarthritis   . Polycystic ovarian disease    Past Surgical History:  Procedure Laterality Date  . ABDOMINAL HYSTERECTOMY    . bladder sling    . CARDIAC CATHETERIZATION  02/2012   [facility] 1 stent place  . CERVICAL POLYPECTOMY    . CHOLECYSTECTOMY  1982  . COLONOSCOPY WITH PROPOFOL N/A 02/05/2015   Procedure: COLONOSCOPY WITH PROPOFOL;  Surgeon: Lucilla Lame, MD;  Location: [facility] END[facility]COPY;  Service: Endoscopy;  Laterality: N/A;  . CORONARY ANGIOPLASTY  02/2012   left/right s/p balloon  . CYSTOGRAM N/A 08/17/2016   Procedure: CYSTOGRAM;  Surgeon: Hollice Espy, MD;  Location: [facility] ORS;  Service: Urology;  Laterality: N/A;  . CYSTOSCOPY N/A 08/17/2016   Procedure: CYSTOSCOPY EXAM UNDER ANESTHESIA;  Surgeon: Hollice Espy, MD;  Location: [facility] ORS;  Service: Urology;  Laterality: N/A;  . CYSTOSCOPY W/ RETROGRADES Bilateral 08/17/2016   Procedure: CYSTOSCOPY WITH RETROGRADE PYELOGRAM;  Surgeon: Hollice Espy, MD;  Location: [facility] ORS;  Service: Urology;  Laterality: Bilateral;  . CYSTOSCOPY WITH STENT PLACEMENT Right 08/17/2016   Procedure: CYSTOSCOPY WITH STENT PLACEMENT;  Surgeon: Hollice Espy, MD;  Location: [facility] OR[facility];  Service: Urology;  Laterality: Right;  . heart stent'  2013  . kidney stent Right 2018  . LYMPH NODE BIOPSY  2011   diagnosis of hodgkins lymphoma  . PELVIC LYMPH NODE DISSECTION N/A 07/29/2016   Procedure: PELVIC/AORTIC LYMPH NODE SAMPLING;  Surgeon: [name], MD;  Location: [facility] ORS;  Service: Gynecology;  Laterality:  N/A;  . PORTA CATH INSERTION N/A 09/22/2016   Procedure: Glori Luis Cath Insertion;  Surgeon: [name], MD;  Location: [place] [facility];  Service: Cardiovascular;  Laterality: N/A;  . PORTA CATH REMOVAL N/A 11/17/2016   Procedure: Glori Luis Cath Removal;  Surgeon: [name], MD;  Location: [place] [facility];  Service: Cardiovascular;  Laterality: N/A;  . ROBOTIC ASSISTED TOTAL HYSTERECTOMY WITH BILATERAL SALPINGO OOPHERECTOMY N/A 07/29/2016   Procedure: ROBOTIC ASSISTED TOTAL HYSTERECTOMY WITH BILATERAL SALPINGO OOPHORECTOMY;  Surgeon: [name], MD;  Location: [facility];  Service: Gynecology;  Laterality: N/A;  . SENTINEL NODE BIOPSY N/A 07/29/2016   Procedure: SENTINEL NODE BIOPSY;  Surgeon: [name], MD;  Location: [facility];  Service: Gynecology;  Laterality: N/A;  . transobturator sling N/A 2009   [place]   Family History  Problem Relation Age of Onset  . ALS Father   . Polymyositis Father   . Diabetes Brother   . Cancer Maternal Aunt        breast  . Breast cancer Maternal Aunt        30's  . Stroke Maternal Grandmother   . Cancer Maternal Grandfather        prostate  . Stroke Maternal Grandfather   . Colon cancer Maternal Aunt   . Non-Hodgkin's lymphoma Cousin    Social History   Socioeconomic History  . Marital status: Widowed    Spouse name: Not on file  . Number of children: Not on file  . Years of education: Not on file  . Highest education level: Not on file  Occupational History  . Not on file  Social Needs  . Financial resource strain: Not hard at all  . Food insecurity:    Worry: Never true    Inability: Never true  . Transportation needs:    Medical: No    Non-medical: No  Tobacco Use  . Smoking status: Former Smoker    Packs/day: 1.00    Years: 30.00    Pack years: 30.00    Types: Cigarettes    Last attempt to quit: 07/24/2002    Years since quitting: 16.3  . Smokeless tobacco: Never Used  . Tobacco comment:  quit smoking in 2000  Substance and Sexual Activity  . Alcohol use: Yes    Comment: occasional, 1-2 drinks per month  . Drug use: No  . Sexual activity: Never  Lifestyle  . Physical activity:    Days per week: Not on file    Minutes per session: Not on file  . Stress: Only a little  Relationships  . Social connections:    Talks on phone: Not on file    Gets together: Not on file    Attends religious service: Not on file    Active member of club or organization: Not on file    Attends meetings of clubs or organizations: Not on file  Relationship status: Not on file  Other Topics Concern  . 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     Outpatient Encounter Medications as of 11/14/2018  Medication Sig  . acetaminophen (TYLENOL) 500 MG tablet Take 1,000 mg by mouth every 6 (six) hours as needed for mild pain.   Marland Kitchen aspirin EC 81 MG tablet Take 81 mg by mouth at bedtime.  Marland Kitchen atorvastatin (LIPITOR) 10 MG tablet Take 1 tablet by mouth once daily  . Cholecalciferol (VITAMIN D3) 2000 units TABS Take 2,000 Units by mouth 3 (three) times a week.  . cyanocobalamin (,VITAMIN B-12,) 1000 MCG/ML injection 1 ml injected IM weekly x 4 ,  Then monthly thereafter  . diazepam (VALIUM) 5 MG tablet Take 1 tablet (5 mg total) by mouth every 12 (twelve) hours as needed for anxiety.  . DULoxetine (CYMBALTA) 30 MG capsule Take 1 capsule (30 mg total) by mouth daily.  . metoprolol tartrate (LOPRESSOR) 25 MG tablet TAKE 1 TABLET BY MOUTH TWICE DAILY  . nitroGLYCERIN (NITROSTAT) 0.4 MG SL tablet Place 1 tablet (0.4 mg total) under the tongue every 5 (five) minutes as needed for chest pain.  . Syringe/Needle, Disp, (SYRINGE 3CC/25GX1") 25G X 1" 3 ML MISC Use for b12 injections  . Turmeric 500 MG CAPS Take 500 mg by mouth 2 (two) times daily.   No facility-administered encounter medications on file as of 11/14/2018.      Activities of Daily Living In your present state of health, do you have any difficulty performing the following activities: 11/14/2018  Hearing? N  Vision? N  Difficulty concentrating or making decisions? N  Walking or climbing stairs? N  Dressing or bathing? N  Doing errands, shopping? N  Preparing Food and eating ? N  Using the Toilet? N  In the past six months, have you accidently leaked urine? N  Do you have problems with loss of bowel control? N  Managing your Medications? N  Managing your Finances? N  Housekeeping or managing your Housekeeping? N  Some recent data might be hidden    Patient Care Team: [name], MD as PCP - General (Internal Medicine) [name], MD as Consulting Physician (General Surgery) Mellody Drown, MD as Referring Physician (Obstetrics and Gynecology) [name], MD as Referring Physician (Obstetrics and Gynecology) Hollice Espy, MD as Consulting Physician (Urology) [name], RN as Registered Nurse Sindy Guadeloupe, MD as Consulting Physician (Oncology)    Assessment:   This is a routine wellness examination for [place].  I connected with patient 11/14/18 at  3:00 PM EDT by a video/audio enabled telemedicine application and verified that I am speaking with the correct person using two identifiers. Patient stated full name and DOB. Patient gave permission to continue with virtual visit. Patient's location was at home and Nurse's location was at [employer] office.   Health Screenings  Mammogram - 12/2015 Colonoscopy - 01/2015 Bone Density - 06/2015 Glaucoma -none Hearing -demonstrates normal hearing during visit. Hemoglobin A1C - 07/2018 Cholesterol - 07/2017 Dental- visits every  Vision- visits within the last 12 months.  Social  Alcohol intake - yes, 1-2 drinks per month     Smoking history- former    Smokers in home? none Illicit drug use? none Exercise - still works 5 days weekly, 6 hours daily. Active  around the home with yard work.  Diet -  regular Sexually Active -never BMI- discussed the importance of a healthy diet, water intake and the benefits of aerobic exercise.  Educational material provided.   Safety  Patient feels safe at home- yes Patient does have smoke detectors at home- yes Patient does wear sunscreen or protective clothing when in direct sunlight -yes Patient does wear seat belt when in a moving vehicle -yes  Covid-19 precautions and sickness symptoms discussed.   Activities of Daily Living Patient denies needing assistance with: driving, household chores, feeding themselves, getting from bed to chair, getting to the toilet, bathing/showering, dressing, managing money, or preparing meals.  No new identified risk were noted.    Depression Screen Patient denies losing interest in daily life, feeling hopeless, or crying easily over simple problems.   Medication-taking as directed and without issues.   Fall Screen Patient denies being afraid of falling or falling in the last year.   Memory Screen Patient is alert.  Patient denies difficulty focusing, concentrating or misplacing items. Correctly identified the president of the [place] , season and recall. Patient likes to play word scapes, quiz world other computer games with friends for brain stimulation.  Immunizations The following Immunizations were discussed: Influenza, shingles, pneumonia, and tetanus.   Other Providers Patient Care Team: [name], MD as PCP - General (Internal Medicine) [name], MD as Consulting Physician (General Surgery) Mellody Drown, MD as Referring Physician (Obstetrics and Gynecology) [name], MD as Referring Physician (Obstetrics and Gynecology) Hollice Espy, MD as Consulting Physician (Urology) [name], RN as Registered Nurse Sindy Guadeloupe, MD as Consulting Physician (Oncology)  Exercise Activities and Dietary recommendations     Goals      Patient Stated   . Increase physical activity (pt-stated)     [employer] Yoga every other day       Fall Risk Fall Risk  11/10/2017 08/19/2017 02/22/2017 11/06/2016 10/22/2016  Falls in the past year? No No No No No   Depression Screen PHQ 2/9 Scores 11/10/2017 08/19/2017 02/22/2017 11/06/2016  PHQ - 2 Score 0 0 0 0  PHQ- 9 Score - - - 0     Cognitive Function MMSE - Mini Mental State Exam 11/10/2017 11/06/2016 11/07/2015  Orientation to time 5 5 5   Orientation to Place 5 5 5   Registration 3 3 3   Attention/ Calculation 5 5 5   Recall 3 3 3   Language- name 2 objects 2 2 2   Language- repeat 1 1 1   Language- follow 3 step command 3 3 3   Language- read & follow direction 1 1 1   Write a sentence 1 1 1   Copy design 1 1 1   Total score 30 30 30      6CIT Screen 11/14/2018  What Year? 0 points  What month? 0 points  What time? 0 points  Count back from 20 0 points  Months in reverse 0 points  Repeat phrase 0 points  Total Score 0    Immunization History  Administered Date(s) Administered  . Pneumococcal Conjugate-13 04/29/2015  . Tdap 10/05/2011   Screening Tests Health Maintenance  Topic Date Due  . PNA vac Low Risk Adult (2 of 2 - PPSV23) 04/28/2016  . MAMMOGRAM  12/25/2017  . INFLUENZA VACCINE  01/14/2019  . COLONOSCOPY  02/05/2020  . TETANUS/TDAP  10/04/2021  . DEXA SCAN  Completed  . Hepatitis C Screening  Completed      Plan:    End of life planning; Advance aging; Advanced directives discussed.  Copy of current HCPOA/Living Will requested.    I have personally reviewed and noted the following in the patient's chart:   . Medical and social history . Use of alcohol, tobacco or illicit drugs  . Current medications and supplements . Functional ability and status . Nutritional status . Physical activity . Advanced directives . List of other physicians . Hospitalizations, surgeries, and ER visits in previous 12 months . Vitals . Screenings to include  cognitive, depression, and falls . Referrals and appointments  In addition, I have reviewed and discussed with patient certain preventive protocols, quality metrics, and best practice recommendations. A written personalized care plan for preventive services as well as general preventive health recommendations were provided to patient.     OBrien-Blaney, Amaiya Scruton L, LPN  08/19/2900   I have reviewed the above information and agree with above.   Deborra Medina, MD

## 2018-11-14 NOTE — Patient Instructions (Addendum)
  Jaime Hall , Thank you for taking time to come for your Medicare Wellness Visit. I appreciate your ongoing commitment to your health goals. Please review the following plan we discussed and let me know if I can assist you in the future.   These are the goals we discussed: Goals      Patient Stated   . Increase physical activity (pt-stated)     [employer] Yoga every other day       This is a list of the screening recommended for you and due dates:  Health Maintenance  Topic Date Due  . Pneumonia vaccines (2 of 2 - PPSV23) 04/28/2016  . Mammogram  12/25/2017  . Flu Shot  01/14/2019  . Colon Cancer Screening  02/05/2020  . Tetanus Vaccine  10/04/2021  . DEXA scan (bone density measurement)  Completed  .  Hepatitis C: One time screening is recommended by Center for Disease Control  (CDC) for  adults born from [birth year] through 1966.   Completed

## 2018-11-14 NOTE — Patient Instructions (Signed)
Since everything else has failed.  Try using Nit shampoo for the itchy scalp.  Leave it on your scalp for ten minutes,  Then rinse off.  You can repeat it one time after 7 days if symptoms persist   Ask Dr Janese Banks if you can have a lipid panel and a cmet with your next labs,  You need a four hour  fast,  And I have ordered the labs

## 2018-11-15 DIAGNOSIS — Z7189 Other specified counseling: Secondary | ICD-10-CM | POA: Insufficient documentation

## 2018-11-15 NOTE — Assessment & Plan Note (Signed)

## 2018-11-15 NOTE — Assessment & Plan Note (Signed)
I have addressed  BMI and recommended wt loss of 10% of body weight over the next 6 months using a low fat, low starch, high protein  fruit/vegetable based Mediterranean diet and 30 minutes of aerobic exercise a minimum of 5 days per week.   

## 2018-11-15 NOTE — Assessment & Plan Note (Signed)
Etiology unclear. Continued despite suspension of statin so she has resumed it.  Sodium, potassium, mg, ionized calcium. Thyroid and ck are all normal.   Resolve with mustard (tuermeric)

## 2018-11-15 NOTE — Assessment & Plan Note (Signed)
Managed with  High potency statin ,  Low dose.   LDL and triglycerides are at goal on current medications. She  has no side effects and liver enzymes are normal. No changes today  Lab Results  Component Value Date   CHOL 109 07/16/2017   HDL 45.10 07/16/2017   LDLCALC 38 07/16/2017   TRIG 128.0 07/16/2017   CHOLHDL 2 07/16/2017   Lab Results  Component Value Date   ALT 18 06/28/2018   AST 26 06/28/2018   ALKPHOS 76 06/28/2018   BILITOT 1.1 06/28/2018

## 2018-11-30 ENCOUNTER — Ambulatory Visit: Payer: PPO

## 2018-12-14 ENCOUNTER — Other Ambulatory Visit: Payer: Self-pay

## 2018-12-14 ENCOUNTER — Inpatient Hospital Stay: Payer: PPO | Attending: Oncology | Admitting: Obstetrics and Gynecology

## 2018-12-14 VITALS — BP 95/61 | HR 78 | Temp 98.8°F | Resp 18 | Ht 61.0 in | Wt 162.0 lb

## 2018-12-14 DIAGNOSIS — Z8542 Personal history of malignant neoplasm of other parts of uterus: Secondary | ICD-10-CM | POA: Diagnosis not present

## 2018-12-14 DIAGNOSIS — Z90710 Acquired absence of both cervix and uterus: Secondary | ICD-10-CM | POA: Diagnosis not present

## 2018-12-14 DIAGNOSIS — Z923 Personal history of irradiation: Secondary | ICD-10-CM | POA: Diagnosis not present

## 2018-12-14 DIAGNOSIS — Z8571 Personal history of Hodgkin lymphoma: Secondary | ICD-10-CM | POA: Diagnosis not present

## 2018-12-14 DIAGNOSIS — Z90722 Acquired absence of ovaries, bilateral: Secondary | ICD-10-CM | POA: Diagnosis not present

## 2018-12-14 DIAGNOSIS — Z08 Encounter for follow-up examination after completed treatment for malignant neoplasm: Secondary | ICD-10-CM | POA: Insufficient documentation

## 2018-12-14 DIAGNOSIS — Z9221 Personal history of antineoplastic chemotherapy: Secondary | ICD-10-CM | POA: Insufficient documentation

## 2018-12-14 DIAGNOSIS — C541 Malignant neoplasm of endometrium: Secondary | ICD-10-CM

## 2018-12-14 NOTE — Progress Notes (Signed)
Gynecologic Oncology Interval Visit   Referring Provider: Dr. Sheronette Cousins  Chief Concern: Stage IB serous endometrial cancer, surveillance  Subjective:  Jaime Hall is a 68 y.o. G2P2 female, initially seen in consultation from Dr. Tullo and Cousins for endometrial cancer, s/p RA- hysterectomy, BSO, SN mapping and biopsy, pelvic adhesiolysis and 3 cycles carbo-taxol and WPRT, NED since then, who presents today for follow up.   She was last seen 03/30/2018 by Dr. Secord. NED at that time. She was supposed to see Dr. Chrystal 09/06/2017 but due to COVID this was delayed and rescheduled for next week. She is also supposed to see Dr. Rao on 12/27/2018.   Today, she reports continued numbness and 'thickened' feeling of bilateral foot, worse on bottoms. She previously tried cymbalta which was d/c d/t hypotension, no relief from gabapentin or acupuncture.   and does not feel pain is worse today. She also has 'lump' on right middle finger which is chronic and unchanged.    She saw Dr. Ward 12/2017.  Gynecologic Oncology History Jaime Hall is a pleasant G2P2 female who is seen in consultation from Dr. Tullo and Cousins for grade 1 endometrial cancer. See prior note for complete details.   Endometrial biopsy done 07/15/16 showed grade 1 endometrial cancer. Surgical management was recommended.   History is also significant for h/o Hodgkin's Lymphoma 2011 treated successfully at [facility] with chemo and RT.   Bladder sling for SUI 2009.  Benign cervical polyp removed 5/16. Has some leg edema due to chronic venous insufficiency.  Had angioplasty and cardiac stents in 2013 and was on Plavix for three years, now just ASA.  Surgical management was recommended.   On 07/29/2016 she underwent robotic hysterectomy, and bilateral salpingo-oophorectomy with washings as well as sentinel node injection, mapping, and biopsy; pelvic adhesiolysis including enterolysis and ovariolysis > 45 minutes. She was  noted to have severe pelvic adhesive disease involving gynecologic organs and the bowel. The cul de sac was obliterated and adherent to the posterior cervix. A bubble test was performed and was negative.   Postop she presented with fevers on 08/05/2016 and was admitted. CT scan 08/05/1016 revealed small pockets of air in the posterior pelvic floor likely postsurgical. There is a 1.8 x 3.8 cm complex fluid collection containing pockets of air with somewhat organized walls within the pelvis at the hysterectomy bed most consistent with developing abscess. There is superior extension of loculated fluid along the left lateral pelvic wall anterior to the left psoas muscle. The component of the loculated fluid along the left lateral pelvic wall measures 2.0 x 4.0 cm and fluid collection along the anterior surface of the left psoas muscle measures approximately 1.8 x 3.6 cm. Blood cultures: + bacterimia with bacteroides fragilis; urine cultures negative.   She was treated with IV antibiotics with improvement in her symptoms. She was discharged on 09/05/2015 on Augmentin.   She was seen by Dr. Ward and complained of vaginal leakage. 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 Subsequently she underwent a double tied tests with oral Pyridium as well as backfilling the bladder with blue. 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  Fluid in her vaginal vault was also appreciated. She was referred to Dr. Brandon, Urology on 08/14/2016. No obvious fistula or leak appreciated on CT urogram, although there is no complete delayed imaging to assess the right-sided ureter.   CT urogram 08/13/2016 IMPRESSION: 1. Stable size of an   abscess at the vaginal cuff with superior extension along the left pelvic sidewall to the posterior margin of the mid sigmoid colon. 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 2. No hydronephrosis. Normal  caliber ureters. No evidence of contrast extravasation from the ureters or bladder, with limitations as described. 3. No evidence of metastatic disease in the abdomen or pelvis. 4. Aortic atherosclerosis.   On 08/17/3016 she underwent Exam under anesthesia, vaginoscopy, cystoscopy, cystogram with interpretation of fluoroscopy less than 30 minutes, bilateral retrograde pyelogram, and right ureteral stent placement.   Intraoperative findings: No obvious ureterovaginal vesicovaginal fistula appreciated. Mild right hydroureteronephrosis down to level of the distal ureter with slight medial deviation without filling defects. Slight mucopurulent drainage from left apical vaginal cuff, specimen cultured.  Culture revealed bateroides fragilis and she was started on a 2 week course of Flagyl iniated on 08/24/2016.  Since that time she continues to do well. She presents today to discuss pathology findings and treatment plan. She is due to have the stent removed on 09/22/2016.   Pathology DIAGNOSIS:  A. SENTINEL LYMPH NODE, RIGHT MID OBTURATOR; EXCISION:  - NO TUMOR SEEN IN ONE LYMPH NODE (0/1).   B. SENTINEL LYMPH NODE, RIGHT EXTERNAL ILIAC; EXCISION:  - NO TUMOR SEEN IN ONE LYMPH NODE (0/1).   C. SENTINEL LYMPH NODE, LEFT EXTERNAL ILIAC; EXCISION:  - NO TUMOR SEEN IN ONE LYMPH NODE (0/1).   D. UTERUS WITH CERVIX, BILATERAL FALLOPIAN TUBES AND OVARIES;  HYSTERECTOMY AND BILATERAL SALPINGO-OOPHORECTOMY:  - SEROUS CARCINOMA, 4.5 CM(SEE NOTE).  - LYMPHOVASCULAR INVASION PRESENT.  - NO TUMOR SEEN IN BILATERAL OVARIES AND FALLOPIAN TUBES.  ENDOMETRIUM:  Procedure: Hysterectomy with bilateral salpingo-oophorectomy  Histologic Type: Serous carcinoma  Myometrial Invasion: Present       Depth of invasion (millimeters): 13 mm  Myometrial thickness (millimeters): 14 mm       Percentage of myometrial invasion: 93%  Uterine Serosa Involvement: Not identified  Cervical Stromal Involvement:  Not identified    BIOMARKER REPORTING TEMPLATE:  p53 Expression: Abnormal strong diffuse overexpression (>90%)  ER/PR negative.  MS stable Her2neu assessment was performed given serous histology and was negative.   CT scan on 09/10/2016 with findings that are very reassuring so stent removed. 1. Interval resolution of abscess involving the vaginal cuff. 2. The proximal end of the right ureteral stent is coiled in the proximal right ureter. No hydronephrosis. 3. No evidence of metastatic disease or other significant changes. 4. Aortic atherosclerosis.   She was referred to Dr. Rao for adjuvant chemotherapy and completed 3 cycles of carbo/taxol. Notable side effects include neuropathy in hands due to prior chemo in past for Hodgkin's disease and more recent regimen.  Taxol dose was reduced 25% with cycle 3 due to neuropathy.    Whole pelvis radiation with Dr Chrystal completed in August 2018.  CT A/P 08/31/2017 IMPRESSION: No evidence of recurrent or metastatic disease.  10/06/2017 PET  IMPRESSION: NEGATIVE FOR MALIGNANCY 1. No findings of active malignancy in the neck, chest, abdomen, or pelvis. 2. Subacute sacral insufficiency fracture with transverse component at S1-2 and vertical components in both sacral ala. There is also a nondisplaced subacute fracture of the right L5 transverse process. Low-grade associated metabolic activity favors a benign etiology. 3. Other imaging findings of potential clinical significance: Aortic Atherosclerosis (ICD10-I70.0). Coronary atherosclerosis. Mild cardiomegaly. Stable paramediastinal fibrosis. Sigmoid diverticulosis.  She has continued back pain with prior hx of sacral insufficiency fracture and fracture of L5 transverse process. She was evaluated for this   and declined intervention  Problem List: Patient Active Problem List   Diagnosis Date Noted  . Educated About Covid-19 Virus Infection 11/15/2018  . Cramps, muscle, general 04/06/2018  .  Chronic kidney disease, stage 2, mildly decreased GFR 10/05/2017  . B12 deficiency anemia 07/24/2017  . Normocytic anemia 07/18/2017  . Back pain 07/18/2017  . Chemotherapy-induced peripheral neuropathy ([place]) 12/10/2016  . 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 Endometrial cancer ([place])   . Pelvic adhesive disease   . Lymphedema 07/20/2016  . Chronic venous insufficiency 07/20/2016  . PAD (peripheral artery disease) ([place]) 07/20/2016  . Postmenopausal bleeding 07/14/2016  . Class 1 obesity due to excess calories without serious comorbidity with body mass index (BMI) of 34.0 to 34.9 in adult 05/27/2016  . Leg cramps 05/27/2016  . Bronchiectasis ([place]) 12/19/2015  . Pre-syncope 12/19/2015  . Hx of colonic polyps   . Benign neoplasm of sigmoid colon   . Prolapsed, uterovaginal, incomplete 06/24/2014  . Routine general medical examination at a health care facility 12/30/2012  . Obesity (BMI 30-39.9) 12/30/2012  . Hx of multiple pulmonary nodules 12/28/2012  . Plantar fasciitis of right foot 12/28/2012  . Neuropathy due to chemotherapeutic drug ([place]) 09/12/2012  . Hip pain, bilateral 09/12/2012  . History of Hodgkin's lymphoma 09/12/2012  . Coronary artery disease   . Hyperlipidemia     Past Medical History: Past Medical History:  Diagnosis Date  . Anxiety   . Cervicalgia   . Coronary artery disease    a. 02/2012 Stress echo: severe anterior wall ischemia;  b. 02/2012 Cath/PCI: LAD 95p (3.0 x 15 Xience EX DES), D1 90ost (PTCA - bifurcational dzs), EF 45% with anterior HK;  b. 02/2013 Ex MV: fixed anterior defect w/ minor reversibility, nl EF-->Med Rx.  . Endometrial cancer ([place])    a. 07/2016 s/p robotic hysterectomy, BSO w/ washings, sentinel node inj, mapping, bx, adhesiolysis.  . Essential hypertension, benign   . Fibrocystic breast disease   . GERD (gastroesophageal reflux disease)   . Gestational hypertension   . Heart murmur   . History of anemia   . History of blood transfusion    . Hodgkin's lymphoma ([place]) 2011   a. s/p radiation and chemo therapy  . Osteoarthritis   . Polycystic ovarian disease     Past Surgical History: Past Surgical History:  Procedure Laterality Date  . ABDOMINAL HYSTERECTOMY    . bladder sling    . CARDIAC CATHETERIZATION  02/2012   [facility] 1 stent place  . CERVICAL POLYPECTOMY    . CHOLECYSTECTOMY  1982  . COLONOSCOPY WITH PROPOFOL N/A 02/05/2015   Procedure: COLONOSCOPY WITH PROPOFOL;  Surgeon: Lucilla Lame, MD;  Location: [facility] ENDOSCOPY;  Service: Endoscopy;  Laterality: N/A;  . CORONARY ANGIOPLASTY  02/2012   left/right s/p balloon  . CYSTOGRAM N/A 08/17/2016   Procedure: CYSTOGRAM;  Surgeon: Hollice Espy, MD;  Location: [facility] ORS;  Service: Urology;  Laterality: N/A;  . CYSTOSCOPY N/A 08/17/2016   Procedure: CYSTOSCOPY EXAM UNDER ANESTHESIA;  Surgeon: Hollice Espy, MD;  Location: [facility] ORS;  Service: Urology;  Laterality: N/A;  . CYSTOSCOPY W/ RETROGRADES Bilateral 08/17/2016   Procedure: CYSTOSCOPY WITH RETROGRADE PYELOGRAM;  Surgeon: Hollice Espy, MD;  Location: [facility] OR[facility];  Service: Urology;  Laterality: Bilateral;  . CYSTOSCOPY WITH STENT PLACEMENT Right 08/17/2016   Procedure: CYSTOSCOPY WITH STENT PLACEMENT;  Surgeon: Hollice Espy, MD;  Location: [facility] OR[facility];  Service: Urology;  Laterality: Right;  . heart stent'  2013  . kidney stent Right 2018  .  LYMPH NODE BIOPSY  2011   diagnosis of hodgkins lymphoma  . PELVIC LYMPH NODE DISSECTION N/A 07/29/2016   Procedure: PELVIC/AORTIC LYMPH NODE SAMPLING;  Surgeon: [name], MD;  Location: [facility] ORS;  Service: Gynecology;  Laterality: N/A;  . PORTA CATH INSERTION N/A 09/22/2016   Procedure: Glori Luis Cath Insertion;  Surgeon: [name], MD;  Location: [place] [facility];  Service: Cardiovascular;  Laterality: N/A;  . PORTA CATH REMOVAL N/A 11/17/2016   Procedure: Glori Luis Cath Removal;  Surgeon: [name], MD;  Location: [place] [facility];  Service: Cardiovascular;   Laterality: N/A;  . ROBOTIC ASSISTED TOTAL HYSTERECTOMY WITH BILATERAL SALPINGO OOPHERECTOMY N/A 07/29/2016   Procedure: ROBOTIC ASSISTED TOTAL HYSTERECTOMY WITH BILATERAL SALPINGO OOPHORECTOMY;  Surgeon: [name], MD;  Location: [facility];  Service: Gynecology;  Laterality: N/A;  . SENTINEL NODE BIOPSY N/A 07/29/2016   Procedure: SENTINEL NODE BIOPSY;  Surgeon: [name], MD;  Location: [facility];  Service: Gynecology;  Laterality: N/A;  . transobturator sling N/A 2009   [place]    Family History: Family History  Problem Relation Age of Onset  . ALS Father   . Polymyositis Father   . Diabetes Brother   . Cancer Maternal Aunt        breast  . Breast cancer Maternal Aunt        30's  . Stroke Maternal Grandmother   . Cancer Maternal Grandfather        prostate  . Stroke Maternal Grandfather   . Colon cancer Maternal Aunt   . Non-Hodgkin's lymphoma Cousin     Social History: Works as Dealer Social History   Socioeconomic History  . Marital status: Widowed    Spouse name: Not on file  . Number of children: Not on file  . Years of education: Not on file  . Highest education level: Not on file  Occupational History  . Not on file  Social Needs  . Financial resource strain: Not hard at all  . Food insecurity    Worry: Never true    Inability: Never true  . Transportation needs    Medical: No    Non-medical: No  Tobacco Use  . Smoking status: Former Smoker    Packs/day: 1.00    Years: 30.00    Pack years: 30.00    Types: Cigarettes    Quit date: 07/24/2002    Years since quitting: 16.4  . Smokeless tobacco: Never Used  . Tobacco comment: quit smoking in 2000  Substance and Sexual Activity  . Alcohol use: Yes    Comment: occasional, 1-2 drinks per month  . Drug use: No  . Sexual activity: Never  Lifestyle  . Physical activity    Days per week: Not on file    Minutes per session: Not on file  . Stress: Only a little   Relationships  . Social Herbalist on phone: Not on file    Gets together: Not on file    Attends religious service: Not on file    Active member of club or organization: Not on file    Attends meetings of clubs or organizations: Not on file    Relationship status: Not on file  . Intimate partner violence    Fear of current or ex partner: Not on file    Emotionally abused: Not on file    Physically abused: Not on file    Forced sexual activity: Not on file  Other Topics Concern  . 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     Allergies: Allergies  Allergen Reactions  . Adhesive [Tape] Other (See Comments)    Burns the skin  . Z-Pak [Azithromycin] Itching  . Antifungal [Miconazole Nitrate] Rash  . Sulfa Antibiotics Nausea And Vomiting and Rash    N/T  . Tolnaftate Rash    Current Medications: Current Outpatient Medications  Medication Sig Dispense Refill  . acetaminophen (TYLENOL) 500 MG tablet Take 1,000 mg by mouth every 6 (six) hours as needed for mild pain.     Marland Kitchen aspirin EC 81 MG tablet Take 81 mg by mouth at bedtime.    Marland Kitchen atorvastatin (LIPITOR) 10 MG tablet Take 1 tablet by mouth once daily 90 tablet 2  . Cholecalciferol (VITAMIN D3) 2000 units TABS Take 2,000 Units by mouth 3 (three) times a week.    . cyanocobalamin (,VITAMIN B-12,) 1000 MCG/ML injection 1 ml injected IM weekly x 4 ,  Then monthly thereafter 10 mL 2  . diazepam (VALIUM) 5 MG tablet Take 1 tablet (5 mg total) by mouth every 12 (twelve) hours as needed for anxiety. 30 tablet 0  . DULoxetine (CYMBALTA) 30 MG capsule Take 1 capsule (30 mg total) by mouth daily. 90 capsule 1  . metoprolol tartrate (LOPRESSOR) 25 MG tablet TAKE 1 TABLET BY MOUTH TWICE DAILY 180 tablet 3  . nitroGLYCERIN (NITROSTAT) 0.4 MG SL tablet Place 1 tablet (0.4 mg total) under the tongue every 5 (five) minutes as needed for chest pain. 25  tablet 3  . Syringe/Needle, Disp, (SYRINGE 3CC/25GX1") 25G X 1" 3 ML MISC Use for b12 injections 50 each 0  . Turmeric 500 MG CAPS Take 500 mg by mouth 2 (two) times daily.     No current facility-administered medications for this visit.     Review of Systems General: fatigue  HEENT: no complaints  Lungs: no complaints  Cardiac: no complaints  GI: no complaints  GU: no complaints of bladder dysfunction today  Musculoskeletal: back pain  Extremities: leg swelling; cramps in legs, thighs, and chest  Skin: no complaints  Neuro: peripheral neuropathy with foot pain  Endocrine: no complaints  Psych: feeling sad     Objective:  Physical Examination:  BP 95/61 (BP Location: Right Arm, Patient Position: Sitting)   Pulse 78   Temp 98.8 F (37.1 C) (Tympanic)   Resp 18   Ht 5' 1" (1.549 m)   Wt 162 lb (73.5 kg)   SpO2 98%   BMI 30.61 kg/m    ECOG Performance Status: 1 - Symptomatic but completely ambulatory  General appearance: alert, cooperative and appears stated age HEENT:PERRLA, extra ocular movement intact and sclera clear, anicteric  CV: Regular rate and rhythm Lungs: bilateral clear to auscultation Lymph node survey: non-palpable, axillary, inguinal, supraclavicular.  Abdomen: soft, non-tender, without masses or organomegaly, no hernias and well healed incision Back: inspection of back is normal; nontender to palpation Skin exam - incisions all well healed. Extremity: bilaterally symmetrical no signficant swelling. Possible ganglion cyst middle finger Neurological exam reveals alert, oriented, normal speech, no focal findings or movement disorder noted.  Pelvic: exam chaperoned by NP;  Vulva: normal appearing vulva with no masses, tenderness or lesions; Vagina: normal vagina;  Adnexa/Uterus/Cervix:absent. Bimanual: no masses or tenderness.  Rectal: deferred    Assessment:  Jaime Hall is a 68 y.o.  female diagnosed with stage IB high grade serous endometrial  cancer, high intermediate risk disease. 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 Complete resolution of vaginal cuff abscess.  Severe pelvic adhesive disease.  Now s/p 3 cycles of carboplatin/taxol and whole pelvic radiation. Imaging and exam  NED.   Multiple complaints, most likely not malignancy related. Could potentially be due to prior therapy.   Medical co-morbidities complicating care: 30 pack year former smoker s/p coronary angioplasty and stents 2013 and takes baby ASA.  Plan:   Problem List Items Addressed This Visit      Genitourinary   Endometrial cancer (HCC)    Other Visit Diagnoses    Encounter for follow-up surveillance of endometrial cancer    -  Primary       Continue surveillance visit including pelvic exams every 4 months for this 3rd year, then every 6-12 months for 3-5 years and then annually thereafter. She will see Dr. Ward in 3 months and return to our clinic in 7 months. She would like to reduce her doctor visits as she has so many different doctors involved in her care. Given her visit today with us, we will reach out to Dr. Chrystal to cancel her radiation oncology visits. We asked her to discuss with Dr. Rao. If Dr. Rao feels she still needs to follow up for lymphoma I asked the patient to keep her appointments with her.   We will also continue to follow closely with return to our clinic in 7 months.   I personally had a face to face interaction and evaluated the patient jointly with the NP, Ms. Lauren Allen.  I have reviewed her history and available records and have performed the key portions of the physical exam including HEENT, general, abdominal exam, pelvic exam with my findings confirming those documented above by the APP.  I have discussed the case with the  APP and the patient.  I agree with the above documentation, assessment and plan which was fully formulated by me.  Counseling was completed by me.   I personally saw the patient and performed a substantive portion of this encounter in conjunction with the listed APP as documented above.  A total of 25 minutes were spent with the patient/family today; >50% was spent in education, counseling and coordination of care for endometrial cancer.  Angeles Alvarez Secord, MD   CC:  Teresa L Tullo, MD [address] [place], [place] [address] [phone]  Chelsea Ward, MD  Ashley Brandon, MD 

## 2018-12-14 NOTE — Progress Notes (Signed)
No new changes noted today 

## 2018-12-27 ENCOUNTER — Ambulatory Visit: Payer: PPO | Admitting: Radiation Oncology

## 2018-12-27 ENCOUNTER — Encounter: Payer: Self-pay | Admitting: Oncology

## 2018-12-27 ENCOUNTER — Inpatient Hospital Stay: Payer: PPO

## 2018-12-27 ENCOUNTER — Inpatient Hospital Stay (HOSPITAL_BASED_OUTPATIENT_CLINIC_OR_DEPARTMENT_OTHER): Payer: PPO | Admitting: Oncology

## 2018-12-27 ENCOUNTER — Other Ambulatory Visit: Payer: Self-pay

## 2018-12-27 VITALS — BP 131/54 | HR 63 | Temp 98.7°F | Resp 18 | Ht 61.0 in | Wt 162.9 lb

## 2018-12-27 DIAGNOSIS — C541 Malignant neoplasm of endometrium: Secondary | ICD-10-CM

## 2018-12-27 DIAGNOSIS — Z8542 Personal history of malignant neoplasm of other parts of uterus: Secondary | ICD-10-CM | POA: Diagnosis not present

## 2018-12-27 DIAGNOSIS — Z08 Encounter for follow-up examination after completed treatment for malignant neoplasm: Secondary | ICD-10-CM

## 2018-12-27 LAB — COMPREHENSIVE METABOLIC PANEL
ALT: 16 U/L (ref 0–44)
AST: 22 U/L (ref 15–41)
Albumin: 4 g/dL (ref 3.5–5.0)
Alkaline Phosphatase: 61 U/L (ref 38–126)
Anion gap: 8 (ref 5–15)
BUN: 22 mg/dL (ref 8–23)
CO2: 25 mmol/L (ref 22–32)
Calcium: 9.2 mg/dL (ref 8.9–10.3)
Chloride: 107 mmol/L (ref 98–111)
Creatinine, Ser: 1.11 mg/dL — ABNORMAL HIGH (ref 0.44–1.00)
GFR calc Af Amer: 59 mL/min — ABNORMAL LOW (ref >60)
GFR calc non Af Amer: 51 mL/min — ABNORMAL LOW (ref >60)
Glucose, Bld: 85 mg/dL (ref 70–99)
Potassium: 4.6 mmol/L (ref 3.5–5.1)
Sodium: 140 mmol/L (ref 135–145)
Total Bilirubin: 1.5 mg/dL — ABNORMAL HIGH (ref 0.3–1.2)
Total Protein: 7 g/dL (ref 6.5–8.1)

## 2018-12-27 LAB — CBC WITH DIFFERENTIAL/PLATELET
Abs Immature Granulocytes: 0.01 10*3/uL (ref 0.00–0.07)
Basophils Absolute: 0 10*3/uL (ref 0.0–0.1)
Basophils Relative: 0 %
Eosinophils Absolute: 0.1 10*3/uL (ref 0.0–0.5)
Eosinophils Relative: 2 %
HCT: 37.4 % (ref 36.0–46.0)
Hemoglobin: 12.2 g/dL (ref 12.0–15.0)
Immature Granulocytes: 0 %
Lymphocytes Relative: 15 %
Lymphs Abs: 0.7 10*3/uL (ref 0.7–4.0)
MCH: 29.5 pg (ref 26.0–34.0)
MCHC: 32.6 g/dL (ref 30.0–36.0)
MCV: 90.6 fL (ref 80.0–100.0)
Monocytes Absolute: 0.5 10*3/uL (ref 0.1–1.0)
Monocytes Relative: 10 %
Neutro Abs: 3.3 10*3/uL (ref 1.7–7.7)
Neutrophils Relative %: 73 %
Platelets: 195 10*3/uL (ref 150–400)
RBC: 4.13 MIL/uL (ref 3.87–5.11)
RDW: 13.4 % (ref 11.5–15.5)
WBC: 4.6 10*3/uL (ref 4.0–10.5)
nRBC: 0 % (ref 0.0–0.2)

## 2018-12-27 LAB — LIPID PANEL
Cholesterol: 121 mg/dL (ref 0–200)
HDL: 61 mg/dL (ref >40)
LDL Cholesterol: 46 mg/dL (ref 0–99)
Total CHOL/HDL Ratio: 2 RATIO
Triglycerides: 71 mg/dL (ref <150)
VLDL: 14 mg/dL (ref 0–40)

## 2018-12-27 NOTE — Progress Notes (Signed)
Itching on her head especially in evening, changed shampoo, tried dandruff shampoo even though she does not have dandruff

## 2018-12-28 LAB — CA 125: Cancer Antigen (CA) 125: 16 U/mL (ref 0.0–38.1)

## 2018-12-29 NOTE — Progress Notes (Signed)
Hematology/Oncology Consult note [facility]  [phone] [phone]  Patient Care Team: [name], MD as PCP - General (Internal Medicine) [name], MD as Consulting Physician (General Surgery) Mellody Drown, MD as Referring Physician (Obstetrics and Gynecology) [name], MD as Referring Physician (Obstetrics and Gynecology) Hollice Espy, MD as Consulting Physician (Urology) [name], RN as Registered Nurse Sindy Guadeloupe, MD as Consulting Physician (Oncology)   Name of the patient: Jaime Hall  [id number]  Nov 08, 1950   Date of visit: 12/29/18  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 [name] grade 1 endometrial cancer when she presented with postmenopausal bleeding in February 2018. She underwent robotic hysterectomy and bilateral salpingo-oophorectomy with washings and sentinel lymph node injection and mapping and biopsy on 07/29/2016. She was found to have extensive pelvic adhesive disease and underwent elevated pelvic adhesiolysis including enteric and ovariolysis. The cul de sac was obliterated and adherent to posterior cervix.-was negative.  2. Pathology showed: Pathology DIAGNOSIS:  A. SENTINEL LYMPH NODE, RIGHT MID OBTURATOR; EXCISION:  - NO TUMOR SEEN IN ONE LYMPH NODE (0/1).   B. SENTINEL LYMPH NODE, RIGHT EXTERNAL ILIAC; EXCISION:  - NO TUMOR SEEN IN ONE LYMPH NODE (0/1).   C. SENTINEL LYMPH NODE, LEFT EXTERNAL ILIAC; EXCISION:  - NO TUMOR SEEN IN ONE LYMPH NODE (0/1).   D. UTERUS WITH CERVIX, BILATERAL FALLOPIAN TUBES AND OVARIES;  HYSTERECTOMY AND BILATERAL SALPINGO-OOPHORECTOMY:  - SEROUS CARCINOMA, 4.5 CM(SEE NOTE).  - LYMPHOVASCULAR INVASION PRESENT.  - NO TUMOR SEEN IN BILATERAL OVARIES AND  FALLOPIAN TUBES.    ENDOMETRIUM:  Procedure: Hysterectomy with bilateral salpingo-oophorectomy  Histologic Type: Serous carcinoma  Myometrial Invasion: Present  Depth of invasion (millimeters): 13 mm  Myometrial thickness (millimeters): 14 mm  Percentage of myometrial invasion: 93%  Uterine Serosa Involvement: Not identified  Cervical Stromal Involvement: Not identified   BIOMARKER REPORTING TEMPLATE:  p53 Expression: Abnormal strong diffuse overexpression (>90%)  ER/PR negative. Her2 pending MS stable  3. She presented with fever on 08/05/2016 and CT scan showed 1.8 x 3.8 cm complex fluid collection containing pockets of air within somewhat organized walls within the pelvis and the hysterectomy bed most consistent with a developing abscess. There is a superior extension of the loculated fluid along the left lateral pelvic wall anterior to the left psoas muscle measuring 2 x 4 cm and another fluid collection along the anterior surface of the left psoas muscle measuring 1.8 x 3.6 cm. Blood cultures were positive for bacteria growing his fragilis. Urine cultures were negative. Patient was treated with IV antibiotics and discharged on Augmentin.  4. 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 [name] from urology on 08/14/2016. No leak was seen on CT urogram.  5. On 08/17/2016 patient underwent Exam under anesthesia, vaginoscopy, cystoscopy, cystogram with interpretation of fluoroscopy less than 30 minutes, bilateral retrograde pyelogram, and right ureteral stent placement. Intraoperative findings: No obvious ureterovaginal vesicovaginal fistula appreciated. Mild right hydroureteronephrosis down to level of the distal ureter with slight medial deviation without filling defects. Slight mucopurulent drainage from left apical vaginal  cuff, specimen cultured. Culture revealed bateroides fragilis and she was started on a 2 week course of Flagyl iniated on 08/24/2016 which  she has now completed  6. Repeat CT abdomen on 09/10/2016 IMPRESSION: 1. Interval resolution of abscess involving the vaginal cuff. 2. The proximal end of the right ureteral stent is coiled in the proximal right ureter. No hydronephrosis. 3. No evidence of metastatic disease or other significant changes. 4. Aortic atherosclerosis  7. Patient has been recovering slowly following her complicated postoperative coursewithout fever or vaginal leakage. She did suffer from fatigue. She remained independent in her ADLs.She has a prior history of Hodgkin's lymphoma in 2011 treated with anthracycline-based chemotherapy  8. There was concern about port infection and was looked at by vascular surgery. They did not think it was infected and recommend conservative management.  9.Patient completed 3 cycles of carbo/taxol chemotherapy5/31/18. Taxol was dose reduced by 25% during dose 3 due to neuropathy.   10. Patient completed 5 weeks of external beam whole pelvic radiation 01/19/17.    Interval history-patient reports intense itching sensation in her scalp which has persisted over the last week or so despite using antidandruff shampoos.  She has chronic tingling numbness in her bilateral hands and feet which has remained stable.  ECOG PS- 1 Pain scale- 0 Opioid associated constipation- no  Review of systems- Review of Systems  Constitutional: Positive for malaise/fatigue. Negative for chills, fever and weight loss.  HENT: Negative for congestion, ear discharge and nosebleeds.   Eyes: Negative for blurred vision.  Respiratory: Negative for cough, hemoptysis, sputum production, shortness of breath and wheezing.   Cardiovascular: Negative for chest pain, palpitations, orthopnea and claudication.  Gastrointestinal: Negative for abdominal  pain, blood in stool, constipation, diarrhea, heartburn, melena, nausea and vomiting.  Genitourinary: Negative for dysuria, flank pain, frequency, hematuria and urgency.  Musculoskeletal: Negative for back pain, joint pain and myalgias.  Skin: Positive for itching. Negative for rash.  Neurological: Negative for dizziness, tingling, focal weakness, seizures, weakness and headaches.  Endo/Heme/Allergies: Does not bruise/bleed easily.  Psychiatric/Behavioral: Negative for depression and suicidal ideas. The patient does not have insomnia.       Allergies  Allergen Reactions  . Adhesive [Tape] Other (See Comments)    Burns the skin  . Z-Pak [Azithromycin] Itching  . Antifungal [Miconazole Nitrate] Rash  . Sulfa Antibiotics Nausea And Vomiting and Rash    N/T  . Tolnaftate Rash     Past Medical History:  Diagnosis Date  . Anxiety   . Cervicalgia   . Coronary artery disease    a. 02/2012 Stress echo: severe anterior wall ischemia;  b. 02/2012 Cath/PCI: LAD 95p (3.0 x 15 Xience EX DES), D1 90ost (PTCA - bifurcational dzs), EF 45% with anterior HK;  b. 02/2013 Ex MV: fixed anterior defect w/ minor reversibility, nl EF-->Med Rx.  . Endometrial cancer ([place])    a. 07/2016 s/p robotic hysterectomy, BSO w/ washings, sentinel node inj, mapping, bx, adhesiolysis.  . Essential hypertension, benign   . Fibrocystic breast disease   . GERD (gastroesophageal reflux disease)   . Gestational hypertension   . Heart murmur   . History of anemia   . History of blood transfusion   . Hodgkin's lymphoma ([place]) 2011   a. s/p radiation and chemo therapy  . Osteoarthritis   . Polycystic ovarian disease      Past Surgical History:  Procedure Laterality Date  . ABDOMINAL HYSTERECTOMY    . bladder sling    . CARDIAC CATHETERIZATION  02/2012   [facility] 1 stent place  . CERVICAL POLYPECTOMY    . CHOLECYSTECTOMY  1982  . COLONOSCOPY  WITH PROPOFOL N/A 02/05/2015   Procedure: COLONOSCOPY WITH PROPOFOL;  Surgeon:  Lucilla Lame, MD;  Location: [facility];  Service: Endoscopy;  Laterality: N/A;  . CORONARY ANGIOPLASTY  02/2012   left/right s/p balloon  . CYSTOGRAM N/A 08/17/2016   Procedure: CYSTOGRAM;  Surgeon: Hollice Espy, MD;  Location: [facility];  Service: Urology;  Laterality: N/A;  . CYSTOSCOPY N/A 08/17/2016   Procedure: CYSTOSCOPY EXAM UNDER ANESTHESIA;  Surgeon: Hollice Espy, MD;  Location: [facility] ORS;  Service: Urology;  Laterality: N/A;  . CYSTOSCOPY W/ RETROGRADES Bilateral 08/17/2016   Procedure: CYSTOSCOPY WITH RETROGRADE PYELOGRAM;  Surgeon: Hollice Espy, MD;  Location: [facility];  Service: Urology;  Laterality: Bilateral;  . CYSTOSCOPY WITH STENT PLACEMENT Right 08/17/2016   Procedure: CYSTOSCOPY WITH STENT PLACEMENT;  Surgeon: Hollice Espy, MD;  Location: [facility] OR[facility];  Service: Urology;  Laterality: Right;  . heart stent'  2013  . kidney stent Right 2018  . LYMPH NODE BIOPSY  2011   diagnosis of hodgkins lymphoma  . PELVIC LYMPH NODE DISSECTION N/A 07/29/2016   Procedure: PELVIC/AORTIC LYMPH NODE SAMPLING;  Surgeon: [name], MD;  Location: [facility] OR[facility];  Service: Gynecology;  Laterality: N/A;  . PORTA CATH INSERTION N/A 09/22/2016   Procedure: Glori Luis Cath Insertion;  Surgeon: [name], MD;  Location: [place] [facility];  Service: Cardiovascular;  Laterality: N/A;  . PORTA CATH REMOVAL N/A 11/17/2016   Procedure: Glori Luis Cath Removal;  Surgeon: [name], MD;  Location: [place] [facility];  Service: Cardiovascular;  Laterality: N/A;  . ROBOTIC ASSISTED TOTAL HYSTERECTOMY WITH BILATERAL SALPINGO OOPHERECTOMY N/A 07/29/2016   Procedure: ROBOTIC ASSISTED TOTAL HYSTERECTOMY WITH BILATERAL SALPINGO OOPHORECTOMY;  Surgeon: [name], MD;  Location: [facility];  Service: Gynecology;  Laterality: N/A;  . SENTINEL NODE BIOPSY N/A 07/29/2016   Procedure: SENTINEL NODE BIOPSY;  Surgeon: [name], MD;  Location: [facility];  Service: Gynecology;  Laterality:  N/A;  . transobturator sling N/A 2009   [facility]    Social History   Socioeconomic History  . Marital status: Widowed    Spouse name: Not on file  . Number of children: Not on file  . Years of education: Not on file  . Highest education level: Not on file  Occupational History  . Not on file  Social Needs  . Financial resource strain: Not hard at all  . Food insecurity    Worry: Never true    Inability: Never true  . Transportation needs    Medical: No    Non-medical: No  Tobacco Use  . Smoking status: Former Smoker    Packs/day: 1.00    Years: 30.00    Pack years: 30.00    Types: Cigarettes    Quit date: 07/24/2002    Years since quitting: 16.4  . Smokeless tobacco: Never Used  . Tobacco comment: quit smoking in 2000  Substance and Sexual Activity  . Alcohol use: Yes    Comment: rarely-liquor  . Drug use: No  . Sexual activity: Never  Lifestyle  . Physical activity    Days per week: Not on file    Minutes per session: Not on file  . Stress: Only a little  Relationships  . Social Herbalist on phone: Not on file    Gets together: Not on file    Attends religious service: Not on file    Active member of club or organization: Not on file    Attends meetings  of clubs or organizations: Not on file    Relationship status: Not on file  . Intimate partner violence    Fear of current or ex partner: Not on file    Emotionally abused: Not on file    Physically abused: Not on file    Forced sexual activity: Not on file  Other Topics Concern  . 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     Family History  Problem Relation Age of Onset  . ALS Father   . Polymyositis Father   . Diabetes Brother   . Cancer Maternal Aunt        breast  . Breast cancer Maternal Aunt        30's  . Stroke Maternal Grandmother   . Cancer Maternal Grandfather        prostate  . Stroke  Maternal Grandfather   . Colon cancer Maternal Aunt   . Non-Hodgkin's lymphoma Cousin      Current Outpatient Medications:  .  acetaminophen (TYLENOL) 500 MG tablet, Take 1,000 mg by mouth every 6 (six) hours as needed for mild pain. , Disp: , Rfl:  .  aspirin EC 81 MG tablet, Take 81 mg by mouth at bedtime., Disp: , Rfl:  .  atorvastatin (LIPITOR) 10 MG tablet, Take 1 tablet by mouth once daily, Disp: 90 tablet, Rfl: 2 .  Cholecalciferol (VITAMIN D3) 2000 units TABS, Take 2,000 Units by mouth 3 (three) times a week., Disp: , Rfl:  .  cyanocobalamin (,VITAMIN B-12,) 1000 MCG/ML injection, 1 ml injected IM weekly x 4 ,  Then monthly thereafter, Disp: 10 mL, Rfl: 2 .  diazepam (VALIUM) 5 MG tablet, Take 1 tablet (5 mg total) by mouth every 12 (twelve) hours as needed for anxiety., Disp: 30 tablet, Rfl: 0 .  DULoxetine (CYMBALTA) 30 MG capsule, Take 1 capsule (30 mg total) by mouth daily., Disp: 90 capsule, Rfl: 1 .  metoprolol tartrate (LOPRESSOR) 25 MG tablet, TAKE 1 TABLET BY MOUTH TWICE DAILY, Disp: 180 tablet, Rfl: 3 .  nitroGLYCERIN (NITROSTAT) 0.4 MG SL tablet, Place 1 tablet (0.4 mg total) under the tongue every 5 (five) minutes as needed for chest pain., Disp: 25 tablet, Rfl: 3 .  Syringe/Needle, Disp, (SYRINGE 3CC/25GX1") 25G X 1" 3 ML MISC, Use for b12 injections, Disp: 50 each, Rfl: 0 .  Turmeric 500 MG CAPS, Take 500 mg by mouth daily. , Disp: , Rfl:   Physical exam:  Vitals:   12/27/18 1449  BP: (!) 131/54  Pulse: 63  Resp: 18  Temp: 98.7 F (37.1 C)  TempSrc: Tympanic  Weight: 162 lb 14.4 oz (73.9 kg)  Height: '5\' 1"'  (1.549 m)   Physical Exam HENT:     Head: Normocephalic and atraumatic.     Comments: No scalp lesions noted. Eyes:     Pupils: Pupils are equal, round, and reactive to light.  Neck:     Musculoskeletal: Normal range of motion.  Cardiovascular:     Rate and Rhythm: Normal rate and regular rhythm.     Heart sounds: Normal heart sounds.  Pulmonary:      Effort: Pulmonary effort is normal.     Breath sounds: Normal breath sounds.  Abdominal:     General: Bowel sounds are normal. There is no distension.     Palpations: Abdomen is soft.     Tenderness: There  is no abdominal tenderness.  Skin:    General: Skin is warm and dry.  Neurological:     Mental Status: She is alert and oriented to person, place, and time.      CMP Latest Ref Rng & Units 12/27/2018  Glucose 70 - 99 mg/dL 85  BUN 8 - 23 mg/dL 22  Creatinine 0.44 - 1.00 mg/dL 1.11(H)  Sodium 135 - 145 mmol/L 140  Potassium 3.5 - 5.1 mmol/L 4.6  Chloride 98 - 111 mmol/L 107  CO2 22 - 32 mmol/L 25  Calcium 8.9 - 10.3 mg/dL 9.2  Total Protein 6.5 - 8.1 g/dL 7.0  Total Bilirubin 0.3 - 1.2 mg/dL 1.5(H)  Alkaline Phos 38 - 126 U/L 61  AST 15 - 41 U/L 22  ALT 0 - 44 U/L 16   CBC Latest Ref Rng & Units 12/27/2018  WBC 4.0 - 10.5 K/uL 4.6  Hemoglobin 12.0 - 15.0 g/dL 12.2  Hematocrit 36.0 - 46.0 % 37.4  Platelets 150 - 400 K/uL 195      Assessment and plan- Patient is a 68 y.o. female h/o Stage I high risk serous endometrial cancer s/p 3 cyclesof carbo/taxoland WPRT.  She is here for routine follow-up of endometrial cancer  Patient recently had a visit with Dr. [name] and underwent a pelvic exam.  Clinically she is doing well and there is no concern for recurrence based on today's exam.  Ca1 25 is normal.  Patient does plan to see Dr. [name] and Dr. Leonides Schanz as well every 6 months and therefore she does not need to follow-up with medical oncology at this time.  She can be referred to [place] in the future if questions or concerns arise.  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  If symptoms persist she will get him back with a primary care doctor   Visit Diagnosis 1. Encounter for follow-up surveillance of endometrial cancer      Dr. Randa Evens, MD, MPH [facility]  at [facility] [phone] 12/29/2018 8:57 AM

## 2019-02-15 DIAGNOSIS — N183 Chronic kidney disease, stage 3 (moderate): Secondary | ICD-10-CM | POA: Diagnosis not present

## 2019-02-15 DIAGNOSIS — N281 Cyst of kidney, acquired: Secondary | ICD-10-CM | POA: Diagnosis not present

## 2019-02-15 DIAGNOSIS — I10 Essential (primary) hypertension: Secondary | ICD-10-CM | POA: Diagnosis not present

## 2019-02-15 LAB — CBC AND DIFFERENTIAL
HCT: 38 (ref 36–46)
Hemoglobin: 12.4 (ref 12.0–16.0)
Neutrophils Absolute: 3274
Platelets: 197 (ref 150–399)
WBC: 4.4

## 2019-02-15 LAB — BASIC METABOLIC PANEL WITH GFR
BUN: 19 (ref 4–21)
Creatinine: 1.2 — AB (ref 0.5–1.1)
Glucose: 108
Potassium: 4.2 (ref 3.4–5.3)
Sodium: 140 (ref 137–147)

## 2019-02-22 DIAGNOSIS — N281 Cyst of kidney, acquired: Secondary | ICD-10-CM | POA: Diagnosis not present

## 2019-02-22 DIAGNOSIS — N183 Chronic kidney disease, stage 3 (moderate): Secondary | ICD-10-CM | POA: Diagnosis not present

## 2019-02-22 DIAGNOSIS — I10 Essential (primary) hypertension: Secondary | ICD-10-CM | POA: Diagnosis not present

## 2019-02-24 ENCOUNTER — Other Ambulatory Visit: Payer: Self-pay

## 2019-02-27 ENCOUNTER — Other Ambulatory Visit: Payer: Self-pay | Admitting: Internal Medicine

## 2019-03-08 MED ORDER — "SYRINGE 25G X 1"" 3 ML MISC": 0 refills | Status: DC

## 2019-03-21 ENCOUNTER — Ambulatory Visit: Payer: PPO | Admitting: Cardiovascular Disease

## 2019-03-24 ENCOUNTER — Other Ambulatory Visit: Payer: Self-pay

## 2019-03-28 ENCOUNTER — Other Ambulatory Visit: Payer: Self-pay | Admitting: Oncology

## 2019-04-12 DIAGNOSIS — C541 Malignant neoplasm of endometrium: Secondary | ICD-10-CM | POA: Diagnosis not present

## 2019-04-21 ENCOUNTER — Ambulatory Visit (INDEPENDENT_AMBULATORY_CARE_PROVIDER_SITE_OTHER): Payer: PPO | Admitting: Cardiovascular Disease

## 2019-04-21 ENCOUNTER — Other Ambulatory Visit: Payer: Self-pay

## 2019-04-21 ENCOUNTER — Encounter: Payer: Self-pay | Admitting: Cardiovascular Disease

## 2019-04-21 VITALS — BP 110/60 | HR 75 | Ht 61.0 in | Wt 167.0 lb

## 2019-04-21 DIAGNOSIS — E7800 Pure hypercholesterolemia, unspecified: Secondary | ICD-10-CM | POA: Diagnosis not present

## 2019-04-21 DIAGNOSIS — I10 Essential (primary) hypertension: Secondary | ICD-10-CM

## 2019-04-21 DIAGNOSIS — I2510 Atherosclerotic heart disease of native coronary artery without angina pectoris: Secondary | ICD-10-CM | POA: Diagnosis not present

## 2019-04-21 NOTE — Patient Instructions (Signed)
Medication Instructions:  Your physician recommends that you continue on your current medications as directed. Please refer to the Current Medication list given to you today.  *If you need a refill on your cardiac medications before your next appointment, please call your pharmacy*  Lab Work: None ordered If you have labs (blood work) drawn today and your tests are completely normal, you will receive your results only by: . MyChart Message (if you have MyChart) OR . A paper copy in the mail If you have any lab test that is abnormal or we need to change your treatment, we will call you to review the results.  Testing/Procedures: None ordered  Follow-Up: At [facility], you and your health needs are our priority.  As part of our continuing mission to provide you with exceptional heart care, we have created designated Provider Care Teams.  These Care Teams include your primary Cardiologist (physician) and Advanced Practice Providers (APPs -  Physician Assistants and Nurse Practitioners) who all work together to provide you with the care you need, when you need it.  Your next appointment:   12 months  The format for your next appointment:   In Person  Provider:    You may see Dr. Arida or one of the following Advanced Practice Providers on your designated Care Team:    Christopher Berge, NP  Ryan Dunn, PA-C  Jacquelyn Visser, PA-C   Other Instructions N/A  

## 2019-04-21 NOTE — Progress Notes (Signed)
Cardiology Office Note   Date:  04/21/2019   ID:  Jaime Hall, DOB Sep 26, 1950, MRN [id number]  PCP:  [name], MD  Cardiologist:   [name], MD   Chief Complaint  Patient presents with  . Other    12 month follow up. Patient c/o some Chest pain. Meds reviewed verbally with patient.       History of Present Illness: Jaime Hall is a 68 y.o. female who presents for a followup visit regarding coronary artery disease.  She has history of Hodgkin's lymphoma treated with radiation and chemotherapy to the chest area and has been in remission since 2011.  She also had uterine cancer in 2018 treated with surgery and chemotherapy.  She is a former smoker.   She had previous angioplasty and drug-eluting stent placement to the proximal LAD with balloon angioplasty of the diagonal in September 2013.  No cardiac events since then.  A treadmill nuclear stress test in September of 2014 showed a fixed anterior wall defect with minor reversibility. Ejection fraction was normal.  She has been doing well from a cardiac standpoint with minimal rare episodes of chest pain and no shortness of breath.  Past Medical History:  Diagnosis Date  . Anxiety   . Cervicalgia   . Coronary artery disease    a. 02/2012 Stress echo: severe anterior wall ischemia;  b. 02/2012 Cath/PCI: LAD 95p (3.0 x 15 Xience EX DES), D1 90ost (PTCA - bifurcational dzs), EF 45% with anterior HK;  b. 02/2013 Ex MV: fixed anterior defect w/ minor reversibility, nl EF-->Med Rx.  . Endometrial cancer ([place])    a. 07/2016 s/p robotic hysterectomy, BSO w/ washings, sentinel node inj, mapping, bx, adhesiolysis.  . Essential hypertension, benign   . Fibrocystic breast disease   . GERD (gastroesophageal reflux disease)   . Gestational hypertension   . Heart murmur   . History of anemia   . History of blood transfusion   . Hodgkin's lymphoma ([place]) 2011   a. s/p radiation and chemo therapy  . Osteoarthritis   .  Polycystic ovarian disease     Past Surgical History:  Procedure Laterality Date  . ABDOMINAL HYSTERECTOMY    . bladder sling    . CARDIAC CATHETERIZATION  02/2012   [facility] 1 stent place  . CERVICAL POLYPECTOMY    . CHOLECYSTECTOMY  1982  . COLONOSCOPY WITH PROPOFOL N/A 02/05/2015   Procedure: COLONOSCOPY WITH PROPOFOL;  Surgeon: Lucilla Lame, MD;  Location: [facility];  Service: Endoscopy;  Laterality: N/A;  . CORONARY ANGIOPLASTY  02/2012   left/right s/p balloon  . CYSTOGRAM N/A 08/17/2016   Procedure: CYSTOGRAM;  Surgeon: Hollice Espy, MD;  Location: [facility];  Service: Urology;  Laterality: N/A;  . CYSTOSCOPY N/A 08/17/2016   Procedure: CYSTOSCOPY EXAM UNDER ANESTHESIA;  Surgeon: Hollice Espy, MD;  Location: [facility];  Service: Urology;  Laterality: N/A;  . CYSTOSCOPY W/ RETROGRADES Bilateral 08/17/2016   Procedure: CYSTOSCOPY WITH RETROGRADE PYELOGRAM;  Surgeon: Hollice Espy, MD;  Location: [facility];  Service: Urology;  Laterality: Bilateral;  . CYSTOSCOPY WITH STENT PLACEMENT Right 08/17/2016   Procedure: CYSTOSCOPY WITH STENT PLACEMENT;  Surgeon: Hollice Espy, MD;  Location: [facility];  Service: Urology;  Laterality: Right;  . heart stent'  2013  . kidney stent Right 2018  . LYMPH NODE BIOPSY  2011   diagnosis of hodgkins lymphoma  . PELVIC LYMPH NODE DISSECTION N/A 07/29/2016   Procedure: PELVIC/AORTIC LYMPH NODE SAMPLING;  Surgeon:  Angeles Gaetana Michaelis, MD;  Location: [facility];  Service: Gynecology;  Laterality: N/A;  . PORTA CATH INSERTION N/A 09/22/2016   Procedure: Glori Luis Cath Insertion;  Surgeon: [name], MD;  Location: [place] [facility];  Service: Cardiovascular;  Laterality: N/A;  . PORTA CATH REMOVAL N/A 11/17/2016   Procedure: Glori Luis Cath Removal;  Surgeon: [name], MD;  Location: [place] [facility];  Service: Cardiovascular;  Laterality: N/A;  . ROBOTIC ASSISTED TOTAL HYSTERECTOMY WITH BILATERAL SALPINGO OOPHERECTOMY N/A 07/29/2016   Procedure:  ROBOTIC ASSISTED TOTAL HYSTERECTOMY WITH BILATERAL SALPINGO OOPHORECTOMY;  Surgeon: [name], MD;  Location: [facility];  Service: Gynecology;  Laterality: N/A;  . SENTINEL NODE BIOPSY N/A 07/29/2016   Procedure: SENTINEL NODE BIOPSY;  Surgeon: [name], MD;  Location: [facility];  Service: Gynecology;  Laterality: N/A;  . transobturator sling N/A 2009   [place]     Current Outpatient Medications  Medication Sig Dispense Refill  . acetaminophen (TYLENOL) 500 MG tablet Take 1,000 mg by mouth every 6 (six) hours as needed for mild pain.     Marland Kitchen aspirin EC 81 MG tablet Take 81 mg by mouth at bedtime.    Marland Kitchen atorvastatin (LIPITOR) 10 MG tablet Take 1 tablet by mouth once daily 90 tablet 2  . Cholecalciferol (VITAMIN D3) 2000 units TABS Take 2,000 Units by mouth 3 (three) times a week.    . cyanocobalamin (,VITAMIN B-12,) 1000 MCG/ML injection INJECT 1ML IM WEEKLY FOR 4 WEEKS, THEN INJECT MONTHLY THEREAFTER 10 mL 0  . diazepam (VALIUM) 5 MG tablet Take 1 tablet (5 mg total) by mouth every 12 (twelve) hours as needed for anxiety. 30 tablet 0  . DULoxetine (CYMBALTA) 30 MG capsule Take 1 capsule by mouth once daily 90 capsule 0  . metoprolol tartrate (LOPRESSOR) 25 MG tablet TAKE 1 TABLET BY MOUTH TWICE DAILY 180 tablet 3  . nitroGLYCERIN (NITROSTAT) 0.4 MG SL tablet Place 1 tablet (0.4 mg total) under the tongue every 5 (five) minutes as needed for chest pain. 25 tablet 3  . Syringe/Needle, Disp, (SYRINGE 3CC/25GX1") 25G X 1" 3 ML MISC Use for b12 injections 50 each 0  . Turmeric 500 MG CAPS Take 500 mg by mouth daily.      No current facility-administered medications for this visit.     Allergies:   Adhesive [tape], Z-pak [azithromycin], Antifungal [miconazole nitrate], Sulfa antibiotics, and Tolnaftate    Social History:  The patient  reports that she quit smoking about 16 years ago. Her smoking use included cigarettes. She has a 30.00 pack-year smoking history. She has  never used smokeless tobacco. She reports current alcohol use. She reports that she does not use drugs.   Family History:  The patient's family history includes ALS in her father; Breast cancer in her maternal aunt; Cancer in her maternal aunt and maternal grandfather; Colon cancer in her maternal aunt; Diabetes in her brother; Non-Hodgkin's lymphoma in her cousin; Polymyositis in her father; Stroke in her maternal grandfather and maternal grandmother.    ROS:  Please see the history of present illness.   Otherwise, review of systems are positive for none.   All other systems are reviewed and negative.    PHYSICAL EXAM: VS:  BP 110/60 (BP Location: Left Arm, Patient Position: Sitting, Cuff Size: Normal)   Pulse 75   Ht 5\' 1"  (1.549 m)   Wt 167 lb (75.8 kg)   BMI 31.55 kg/m  , BMI Body mass index  is 31.55 kg/m. GEN: Well nourished, well developed, in no acute distress  HEENT: normal  Neck: no JVD, carotid bruits, or masses Cardiac: RRR; no murmurs, rubs, or gallops,no edema  Respiratory:  clear to auscultation bilaterally, normal work of breathing GI: soft, nontender, nondistended, + BS MS: no deformity or atrophy  Skin: warm and dry, no rash Neuro:  Strength and sensation are intact Psych: euthymic mood, full affect   EKG:  EKG is ordered today. The ekg ordered today demonstrates normal sinus rhythm with incomplete left bundle branch block.   Recent Labs: 12/27/2018: ALT 16 02/15/2019: BUN 19; Creatinine 1.2; Hemoglobin 12.4; Platelets 197; Potassium 4.2; Sodium 140    Lipid Panel    Component Value Date/Time   CHOL 121 12/27/2018 1436   CHOL 110 04/11/2012 0835   TRIG 71 12/27/2018 1436   HDL 61 12/27/2018 1436   HDL 56 04/11/2012 0835   CHOLHDL 2.0 12/27/2018 1436   VLDL 14 12/27/2018 1436   LDLCALC 46 12/27/2018 1436   LDLCALC 41 04/11/2012 0835      Wt Readings from Last 3 Encounters:  04/21/19 167 lb (75.8 kg)  12/27/18 162 lb 14.4 oz (73.9 kg)  12/14/18 162  lb (73.5 kg)        ASSESSMENT AND PLAN:  1.  Coronary artery disease involving native coronary arteries without angina. She is overall doing well with no anginal symptoms. Continue medical therapy.  2.  Hyperlipidemia: Continue treatment with low dose atorvastatin. Most recent LDL was 46.  3. Hypertension: Blood pressure is controlled on metoprolol.    Disposition:   FU with me in 1 year  Signed,  [name], MD  04/21/2019 11:44 AM    [place]

## 2019-05-10 ENCOUNTER — Other Ambulatory Visit: Payer: Self-pay

## 2019-07-06 ENCOUNTER — Other Ambulatory Visit: Payer: Self-pay | Admitting: Oncology

## 2019-07-12 ENCOUNTER — Telehealth: Payer: Self-pay | Admitting: *Deleted

## 2019-07-12 NOTE — Telephone Encounter (Signed)
Pt called to ask about taking covid vaccine and she knows that she can't take shingle vaccine and was unsure about this vaccine.

## 2019-07-12 NOTE — Telephone Encounter (Signed)
She can take the vaccine

## 2019-07-19 ENCOUNTER — Ambulatory Visit: Payer: PPO | Admitting: Obstetrics and Gynecology

## 2019-07-19 ENCOUNTER — Telehealth: Payer: Self-pay | Admitting: *Deleted

## 2019-07-19 NOTE — Telephone Encounter (Signed)
Called pt to let her know that she does recommend to have the covid vaccine. She states that the benefits of receiving the vaccine outweighs the side effects of the vaccine. It is a pt. Decision to take it. She can call me back if she needs anything else.

## 2019-07-24 ENCOUNTER — Other Ambulatory Visit: Payer: Self-pay

## 2019-07-24 ENCOUNTER — Ambulatory Visit
Admission: RE | Admit: 2019-07-24 | Discharge: 2019-07-24 | Disposition: A | Discharge status: Home / Self Care | Payer: PPO | Source: Ambulatory Visit | Attending: Internal Medicine | Admitting: Internal Medicine

## 2019-07-24 ENCOUNTER — Telehealth: Payer: Self-pay | Admitting: Internal Medicine

## 2019-07-24 DIAGNOSIS — M25561 Pain in right knee: Secondary | ICD-10-CM | POA: Diagnosis not present

## 2019-07-24 DIAGNOSIS — M7989 Other specified soft tissue disorders: Secondary | ICD-10-CM | POA: Diagnosis not present

## 2019-07-24 DIAGNOSIS — M79661 Pain in right lower leg: Secondary | ICD-10-CM

## 2019-07-24 NOTE — Telephone Encounter (Signed)
STAT ULTRASOUND  OF RIGHT LOWER EXTREMITY ORDERED TO RULE OUT DVT.  PLEASE LET PATIENT KNOW .  NEEDS TO BE DONE BEFORE APPOINTNMENT TOMORROW

## 2019-07-24 NOTE — Telephone Encounter (Signed)
Spoke with pt and she stated that Friday she started having right pain and swelling. She stated that it will give out on her when she is walking. Pt stated that she doesn't remember doing anything to have injured it. Pt is scheduled for a virtual visit tomorrow at 2pm.

## 2019-07-24 NOTE — Telephone Encounter (Signed)
Pt is aware that [name] has ordered a stat US and expecting a call to schedule it.

## 2019-07-24 NOTE — Telephone Encounter (Signed)
Pt said she don't know what she did to her knee but on Friday it starting catching. When she walks if feels like it is going to give out. It is painful. She wants to know if she needs a referral or does she need an appt with [name]. to get an X-ray. She doesn't want it to get any worse.

## 2019-07-25 ENCOUNTER — Encounter: Payer: Self-pay | Admitting: Internal Medicine

## 2019-07-25 ENCOUNTER — Ambulatory Visit (INDEPENDENT_AMBULATORY_CARE_PROVIDER_SITE_OTHER): Payer: PPO | Admitting: Internal Medicine

## 2019-07-25 VITALS — Ht 61.0 in | Wt 167.0 lb

## 2019-07-25 DIAGNOSIS — E538 Deficiency of other specified B group vitamins: Secondary | ICD-10-CM | POA: Diagnosis not present

## 2019-07-25 DIAGNOSIS — M25561 Pain in right knee: Secondary | ICD-10-CM | POA: Diagnosis not present

## 2019-07-25 DIAGNOSIS — E7800 Pure hypercholesterolemia, unspecified: Secondary | ICD-10-CM

## 2019-07-25 DIAGNOSIS — R7301 Impaired fasting glucose: Secondary | ICD-10-CM

## 2019-07-25 DIAGNOSIS — N182 Chronic kidney disease, stage 2 (mild): Secondary | ICD-10-CM

## 2019-07-25 DIAGNOSIS — D649 Anemia, unspecified: Secondary | ICD-10-CM | POA: Diagnosis not present

## 2019-07-25 DIAGNOSIS — M9906 Segmental and somatic dysfunction of lower extremity: Secondary | ICD-10-CM | POA: Diagnosis not present

## 2019-07-25 DIAGNOSIS — I2510 Atherosclerotic heart disease of native coronary artery without angina pectoris: Secondary | ICD-10-CM | POA: Diagnosis not present

## 2019-07-25 DIAGNOSIS — M2241 Chondromalacia patellae, right knee: Secondary | ICD-10-CM | POA: Diagnosis not present

## 2019-07-25 NOTE — Assessment & Plan Note (Signed)
She has no history of chest pain,  Orthopnea or dyspnea .

## 2019-07-25 NOTE — Patient Instructions (Signed)
  You can take up to 2000 mg of acetominophen (tylenol) every day safely  In divided doses (500 mg every 6 hours  Or 1000 mg every 12 hours.)  Apply ice for 15 mintues every 3 to4 hours    X rays an labs asap

## 2019-07-25 NOTE — Progress Notes (Signed)
Virtual Visit via [email or web address]  This visit type was conducted due to national recommendations for restrictions regarding the COVID-19 pandemic (e.g. social distancing).  This format is felt to be most appropriate for this patient at this time.  All issues noted in this document were discussed and addressed.  No physical exam was performed (except for noted visual exam findings with Video Visits).   I connected with@ on 07/25/19 at  2:00 PM EST by a video enabled telemedicine applicationand verified t hat I am speaking with the correct person using two identifiers. Location patient: home Location provider: work or home office Persons participating in the virtual visit: patient, provider  I discussed the limitations, risks, security and privacy concerns of performing an evaluation and management service by telephone and the availability of in person appointments. I also discussed with the patient that there may be a patient responsible charge related to this service. The patient expressed understanding and agreed to proceed.  Reason for visit: new onset medial knee pain and swelling   HPI:  69 yr old female with history of endometrial CA,  Obesity ,  chronic venous insufficiency and PAD presents with new onset knee pain and swelling  With no history of trauma or overuse.    ROS: See pertinent positives and negatives per HPI.  Past Medical History:  Diagnosis Date  . Anxiety   . Cervicalgia   . Coronary artery disease    a. 02/2012 Stress echo: severe anterior wall ischemia;  b. 02/2012 Cath/PCI: LAD 95p (3.0 x 15 Xience EX DES), D1 90ost (PTCA - bifurcational dzs), EF 45% with anterior HK;  b. 02/2013 Ex MV: fixed anterior defect w/ minor reversibility, nl EF-->Med Rx.  . Endometrial cancer ([place])    a. 07/2016 s/p robotic hysterectomy, BSO w/ washings, sentinel node inj, mapping, bx, adhesiolysis.  . Essential hypertension, benign   . Fibrocystic breast disease   . GERD (gastroesophageal reflux  disease)   . Gestational hypertension   . Heart murmur   . History of anemia   . History of blood transfusion   . Hodgkin's lymphoma ([place]) 2011   a. s/p radiation and chemo therapy  . Osteoarthritis   . Polycystic ovarian disease     Past Surgical History:  Procedure Laterality Date  . ABDOMINAL HYSTERECTOMY    . bladder sling    . CARDIAC CATHETERIZATION  02/2012   [facility] 1 stent place  . CERVICAL POLYPECTOMY    . CHOLECYSTECTOMY  1982  . COLONOSCOPY WITH PROPOFOL N/A 02/05/2015   Procedure: COLONOSCOPY WITH PROPOFOL;  Surgeon: Lucilla Lame, MD;  Location: [facility];  Service: Endoscopy;  Laterality: N/A;  . CORONARY ANGIOPLASTY  02/2012   left/right s/p balloon  . CYSTOGRAM N/A 08/17/2016   Procedure: CYSTOGRAM;  Surgeon: Hollice Espy, MD;  Location: [facility];  Service: Urology;  Laterality: N/A;  . CYSTOSCOPY N/A 08/17/2016   Procedure: CYSTOSCOPY EXAM UNDER ANESTHESIA;  Surgeon: Hollice Espy, MD;  Location: [facility];  Service: Urology;  Laterality: N/A;  . CYSTOSCOPY W/ RETROGRADES Bilateral 08/17/2016   Procedure: CYSTOSCOPY WITH RETROGRADE PYELOGRAM;  Surgeon: Hollice Espy, MD;  Location: [facility];  Service: Urology;  Laterality: Bilateral;  . CYSTOSCOPY WITH STENT PLACEMENT Right 08/17/2016   Procedure: CYSTOSCOPY WITH STENT PLACEMENT;  Surgeon: Hollice Espy, MD;  Location: [facility];  Service: Urology;  Laterality: Right;  . heart stent'  2013  . kidney stent Right 2018  . LYMPH NODE BIOPSY  2011   diagnosis of hodgkins lymphoma  .  PELVIC LYMPH NODE DISSECTION N/A 07/29/2016   Procedure: PELVIC/AORTIC LYMPH NODE SAMPLING;  Surgeon: [name], MD;  Location: [facility];  Service: Gynecology;  Laterality: N/A;  . PORTA CATH INSERTION N/A 09/22/2016   Procedure: Glori Luis Cath Insertion;  Surgeon: [name], MD;  Location: [place] [facility];  Service: Cardiovascular;  Laterality: N/A;  . PORTA CATH REMOVAL N/A 11/17/2016   Procedure: Glori Luis Cath Removal;   Surgeon: [name], MD;  Location: [place] [facility];  Service: Cardiovascular;  Laterality: N/A;  . ROBOTIC ASSISTED TOTAL HYSTERECTOMY WITH BILATERAL SALPINGO OOPHERECTOMY N/A 07/29/2016   Procedure: ROBOTIC ASSISTED TOTAL HYSTERECTOMY WITH BILATERAL SALPINGO OOPHORECTOMY;  Surgeon: [name], MD;  Location: [facility];  Service: Gynecology;  Laterality: N/A;  . SENTINEL NODE BIOPSY N/A 07/29/2016   Procedure: SENTINEL NODE BIOPSY;  Surgeon: [name], MD;  Location: [facility];  Service: Gynecology;  Laterality: N/A;  . transobturator sling N/A 2009   [place]    Family History  Problem Relation Age of Onset  . ALS Father   . Polymyositis Father   . Diabetes Brother   . Cancer Maternal Aunt        breast  . Breast cancer Maternal Aunt        30's  . Stroke Maternal Grandmother   . Cancer Maternal Grandfather        prostate  . Stroke Maternal Grandfather   . Colon cancer Maternal Aunt   . Non-Hodgkin's lymphoma Cousin     SOCIAL HX:  reports that she quit smoking about 17 years ago. Her smoking use included cigarettes. She has a 30.00 pack-year smoking history. She has never used smokeless tobacco. She reports current alcohol use. She reports that she does not use drugs.  Current Outpatient Medications:  .  acetaminophen (TYLENOL) 500 MG tablet, Take 1,000 mg by mouth every 6 (six) hours as needed for mild pain. , Disp: , Rfl:  .  aspirin EC 81 MG tablet, Take 81 mg by mouth at bedtime., Disp: , Rfl:  .  atorvastatin (LIPITOR) 10 MG tablet, Take 1 tablet by mouth once daily, Disp: 90 tablet, Rfl: 2 .  Cholecalciferol (VITAMIN D3) 2000 units TABS, Take 2,000 Units by mouth 3 (three) times a week., Disp: , Rfl:  .  cyanocobalamin (,VITAMIN B-12,) 1000 MCG/ML injection, INJECT 1ML IM WEEKLY FOR 4 WEEKS, THEN INJECT MONTHLY THEREAFTER, Disp: 10 mL, Rfl: 0 .  diazepam (VALIUM) 5 MG tablet, Take 1 tablet (5 mg total) by mouth every 12 (twelve) hours as  needed for anxiety., Disp: 30 tablet, Rfl: 0 .  DULoxetine (CYMBALTA) 30 MG capsule, Take 1 capsule by mouth once daily, Disp: 90 capsule, Rfl: 0 .  metoprolol tartrate (LOPRESSOR) 25 MG tablet, TAKE 1 TABLET BY MOUTH TWICE DAILY, Disp: 180 tablet, Rfl: 3 .  nitroGLYCERIN (NITROSTAT) 0.4 MG SL tablet, Place 1 tablet (0.4 mg total) under the tongue every 5 (five) minutes as needed for chest pain., Disp: 25 tablet, Rfl: 3 .  Syringe/Needle, Disp, (SYRINGE 3CC/25GX1") 25G X 1" 3 ML MISC, Use for b12 injections, Disp: 50 each, Rfl: 0 .  Turmeric 500 MG CAPS, Take 500 mg by mouth daily. , Disp: , Rfl:   EXAM:  VITALS per patient if applicable:  GENERAL: alert, oriented, appears well and in no acute distress  HEENT: atraumatic, conjunttiva clear, no obvious abnormalities on inspection of external nose and ears  NECK: normal movements of the head and neck  LUNGS: on inspection no signs of respiratory distress, breathing rate appears normal, no obvious gross SOB, gasping or wheezing  CV: no obvious cyanosis  MS: moves all visible extremities without noticeable abnormality.  Some pretibial swelling,  No bruising Has pain with extension of knee   PSYCH/NEURO: pleasant and cooperative, no obvious depression or anxiety, speech and thought processing grossly intact  ASSESSMENT AND PLAN:  Discussed the following assessment and plan:  Chronic kidney disease, stage 2, mildly decreased GFR - Plan: Comprehensive metabolic panel  Knee pain, right anterior - Plan: DG Knee Complete 4 Views Right  Impaired fasting glucose - Plan: Hemoglobin A1c  Right anterior knee pain  Normocytic anemia - Plan: CBC with Differential/Platelet  B12 deficiency - Plan: Vitamin B12  Right anterior knee pain DVT ruled out,  History suggestive of meniscal tear but without a history of trauma or overuse less likely .  Plain films ordered,  Tylenol/ice,  Will consider ortho referral if no improvement     I discussed  the assessment and treatment plan with the patient. The patient was provided an opportunity to ask questions and all were answered. The patient agreed with the plan and demonstrated an understanding of the instructions.   The patient was advised to call back or seek an in-person evaluation if the symptoms worsen or if the condition fails to improve as anticipated.  I provided  30 minutes of non-face-to-face time during this encounter reviewing patient's current problems and post surgeries.  Providing counseling on the above mentioned problems , and coordination  of care . [name], MD

## 2019-07-25 NOTE — Progress Notes (Signed)
Pt stated that Friday she started having right knee pain and swelling. Pt stated that she does not remember injuring it. Pt stated that she does do some lifting for her job. 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 Pt stated that some times she is fine when walking but other times she can be walking and her knee will give out. She stated that the pain is located in the center of her knee cap.

## 2019-07-25 NOTE — Assessment & Plan Note (Addendum)
DVT ruled out,  History suggestive of meniscal tear but without a history of trauma or overuse less likely .  Plain films ordered,  Tylenol/ice,  Will consider ortho referral if no improvement

## 2019-07-27 ENCOUNTER — Other Ambulatory Visit (INDEPENDENT_AMBULATORY_CARE_PROVIDER_SITE_OTHER): Payer: PPO

## 2019-07-27 ENCOUNTER — Ambulatory Visit (INDEPENDENT_AMBULATORY_CARE_PROVIDER_SITE_OTHER): Payer: PPO

## 2019-07-27 ENCOUNTER — Other Ambulatory Visit: Payer: Self-pay

## 2019-07-27 DIAGNOSIS — D649 Anemia, unspecified: Secondary | ICD-10-CM | POA: Diagnosis not present

## 2019-07-27 DIAGNOSIS — M25561 Pain in right knee: Secondary | ICD-10-CM | POA: Diagnosis not present

## 2019-07-27 DIAGNOSIS — E538 Deficiency of other specified B group vitamins: Secondary | ICD-10-CM | POA: Diagnosis not present

## 2019-07-27 DIAGNOSIS — R7301 Impaired fasting glucose: Secondary | ICD-10-CM

## 2019-07-27 DIAGNOSIS — N182 Chronic kidney disease, stage 2 (mild): Secondary | ICD-10-CM

## 2019-07-27 DIAGNOSIS — M1711 Unilateral primary osteoarthritis, right knee: Secondary | ICD-10-CM | POA: Diagnosis not present

## 2019-07-28 LAB — COMPREHENSIVE METABOLIC PANEL
ALT: 15 U/L (ref 0–35)
AST: 19 U/L (ref 0–37)
Albumin: 3.9 g/dL (ref 3.5–5.2)
Alkaline Phosphatase: 66 U/L (ref 39–117)
BUN: 21 mg/dL (ref 6–23)
CO2: 29 mEq/L (ref 19–32)
Calcium: 9.7 mg/dL (ref 8.4–10.5)
Chloride: 106 mEq/L (ref 96–112)
Creatinine, Ser: 1.13 mg/dL (ref 0.40–1.20)
GFR: 47.63 mL/min — ABNORMAL LOW (ref >60.00)
Glucose, Bld: 79 mg/dL (ref 70–99)
Potassium: 4.3 mEq/L (ref 3.5–5.1)
Sodium: 143 mEq/L (ref 135–145)
Total Bilirubin: 1.1 mg/dL (ref 0.2–1.2)
Total Protein: 6.4 g/dL (ref 6.0–8.3)

## 2019-07-28 LAB — CBC WITH DIFFERENTIAL/PLATELET
Basophils Absolute: 0 10*3/uL (ref 0.0–0.1)
Basophils Relative: 0.5 % (ref 0.0–3.0)
Eosinophils Absolute: 0.1 10*3/uL (ref 0.0–0.7)
Eosinophils Relative: 1.5 % (ref 0.0–5.0)
HCT: 37.6 % (ref 36.0–46.0)
Hemoglobin: 12.2 g/dL (ref 12.0–15.0)
Lymphocytes Relative: 10.7 % — ABNORMAL LOW (ref 12.0–46.0)
Lymphs Abs: 0.7 10*3/uL (ref 0.7–4.0)
MCHC: 32.5 g/dL (ref 30.0–36.0)
MCV: 89.9 fl (ref 78.0–100.0)
Monocytes Absolute: 0.4 10*3/uL (ref 0.1–1.0)
Monocytes Relative: 6.3 % (ref 3.0–12.0)
Neutro Abs: 5.6 10*3/uL (ref 1.4–7.7)
Neutrophils Relative %: 81 % — ABNORMAL HIGH (ref 43.0–77.0)
Platelets: 216 10*3/uL (ref 150.0–400.0)
RBC: 4.18 Mil/uL (ref 3.87–5.11)
RDW: 14 % (ref 11.5–15.5)
WBC: 6.9 10*3/uL (ref 4.0–10.5)

## 2019-07-28 LAB — HEMOGLOBIN A1C: Hgb A1c MFr Bld: 6 % (ref 4.6–6.5)

## 2019-07-28 LAB — VITAMIN B12: Vitamin B-12: 577 pg/mL (ref 211–911)

## 2019-08-02 ENCOUNTER — Other Ambulatory Visit: Payer: Self-pay

## 2019-08-02 ENCOUNTER — Inpatient Hospital Stay: Payer: PPO | Attending: Obstetrics and Gynecology | Admitting: Obstetrics and Gynecology

## 2019-08-02 VITALS — BP 120/47 | HR 83 | Temp 97.3°F | Resp 17 | Wt 166.2 lb

## 2019-08-02 DIAGNOSIS — Z90710 Acquired absence of both cervix and uterus: Secondary | ICD-10-CM | POA: Diagnosis not present

## 2019-08-02 DIAGNOSIS — I872 Venous insufficiency (chronic) (peripheral): Secondary | ICD-10-CM | POA: Insufficient documentation

## 2019-08-02 DIAGNOSIS — N182 Chronic kidney disease, stage 2 (mild): Secondary | ICD-10-CM | POA: Diagnosis not present

## 2019-08-02 DIAGNOSIS — Z90722 Acquired absence of ovaries, bilateral: Secondary | ICD-10-CM | POA: Insufficient documentation

## 2019-08-02 DIAGNOSIS — Z08 Encounter for follow-up examination after completed treatment for malignant neoplasm: Secondary | ICD-10-CM | POA: Diagnosis not present

## 2019-08-02 DIAGNOSIS — M1990 Unspecified osteoarthritis, unspecified site: Secondary | ICD-10-CM | POA: Insufficient documentation

## 2019-08-02 DIAGNOSIS — I129 Hypertensive chronic kidney disease with stage 1 through stage 4 chronic kidney disease, or unspecified chronic kidney disease: Secondary | ICD-10-CM | POA: Diagnosis not present

## 2019-08-02 DIAGNOSIS — E785 Hyperlipidemia, unspecified: Secondary | ICD-10-CM | POA: Insufficient documentation

## 2019-08-02 DIAGNOSIS — Z8571 Personal history of Hodgkin lymphoma: Secondary | ICD-10-CM | POA: Diagnosis not present

## 2019-08-02 DIAGNOSIS — Z7982 Long term (current) use of aspirin: Secondary | ICD-10-CM | POA: Insufficient documentation

## 2019-08-02 DIAGNOSIS — Z955 Presence of coronary angioplasty implant and graft: Secondary | ICD-10-CM | POA: Insufficient documentation

## 2019-08-02 DIAGNOSIS — Z8542 Personal history of malignant neoplasm of other parts of uterus: Secondary | ICD-10-CM | POA: Diagnosis not present

## 2019-08-02 DIAGNOSIS — I2510 Atherosclerotic heart disease of native coronary artery without angina pectoris: Secondary | ICD-10-CM | POA: Diagnosis not present

## 2019-08-02 NOTE — Progress Notes (Signed)
Gynecologic Oncology Interval Visit   Referring Provider: Dr. Servando Salina  Chief Concern: Stage IB serous endometrial cancer, surveillance  Subjective:  Jaime Hall is a 69 y.o. [place] female, initially seen in consultation from [name] and Garwin Brothers for endometrial cancer, s/p RA- hysterectomy, BSO, SN mapping and biopsy, pelvic adhesiolysis and 3 cycles carbo-taxol and WPRT, NED since then, who presents today for follow up.   She was last seen by [place] on 12/14/2018. NED at that time. Dr. Baruch Gouty has released her from clinic. She saw Dr. Janese Banks on 12/27/2018.   She saw Dr. Leonides Schanz 04/12/19 with a negative exam.  She has chronic leg cramps; no gyn complaints.   Gynecologic Oncology History Jaime Hall is a pleasant G55P2 female who is seen in consultation from [name] and Garwin Brothers for grade 1 endometrial cancer. See prior note for complete details.   Endometrial biopsy done 07/15/16 showed grade 1 endometrial cancer. Surgical management was recommended.   History is also significant for h/o Hodgkin's Lymphoma 2011 treated successfully at [facility] with chemo and RT.   Bladder sling for SUI 2009.  Benign cervical polyp removed 5/16. Has some leg edema due to chronic venous insufficiency.  Had angioplasty and cardiac stents in 2013 and was on Plavix for three years, now just ASA.  Surgical management was recommended.   On 07/29/2016 she underwent robotic hysterectomy, and bilateral salpingo-oophorectomy with washings as well as sentinel node injection, mapping, and biopsy; pelvic adhesiolysis including enterolysis and ovariolysis > 45 minutes. She was noted to have severe pelvic adhesive disease involving gynecologic organs and the bowel. The cul de sac was obliterated and adherent to the posterior cervix. A bubble test was performed and was negative.   Postop she presented with fevers on 08/05/2016 and was admitted. CT scan 08/05/1016 revealed small pockets of air in the posterior pelvic  floor likely postsurgical. There is a 1.8 x 3.8 cm complex fluid collection containing pockets of air with somewhat organized walls within the pelvis at the hysterectomy bed most consistent with developing abscess. There is superior extension of loculated fluid along the left lateral pelvic wall anterior to the left psoas muscle. The component of the loculated fluid along the left lateral pelvic wall measures 2.0 x 4.0 cm and fluid collection along the anterior surface of the left psoas muscle measures approximately 1.8 x 3.6 cm. Blood cultures: + bacterimia with bacteroides fragilis; urine cultures negative.   She was treated with IV antibiotics with improvement in her symptoms. She was discharged on 09/05/2015 on Augmentin.   She was seen by Dr. Leonides Schanz and complained of vaginal leakage. 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 Subsequently she underwent a double tied tests with oral Pyridium as well as backfilling the bladder with blue. 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  Fluid in her vaginal vault was also appreciated. She was referred to Dr. [name], Urology on 08/14/2016. No obvious fistula or leak appreciated on CT urogram, although there is no complete delayed imaging to assess the right-sided ureter.   CT urogram 08/13/2016 IMPRESSION: 1. Stable size of an abscess at the vaginal cuff with superior extension along the left pelvic sidewall to the posterior margin of the mid sigmoid colon. 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 2. No hydronephrosis. Normal caliber ureters. No evidence of contrast extravasation from the ureters or bladder, with limitations as described. 3. No evidence of metastatic disease in the abdomen  or pelvis. 4. Aortic atherosclerosis.   On 08/17/3016 she underwent Exam under anesthesia, vaginoscopy, cystoscopy, cystogram with interpretation of fluoroscopy less than 30  minutes, bilateral retrograde pyelogram, and right ureteral stent placement.   Intraoperative findings: No obvious ureterovaginal vesicovaginal fistula appreciated. Mild right hydroureteronephrosis down to level of the distal ureter with slight medial deviation without filling defects. Slight mucopurulent drainage from left apical vaginal cuff, specimen cultured.  Culture revealed bateroides fragilis and she was started on a 2 week course of Flagyl iniated on 08/24/2016.  Since that time she continues to do well. She presents today to discuss pathology findings and treatment plan. She is due to have the stent removed on 09/22/2016.   Pathology DIAGNOSIS:  A. SENTINEL LYMPH NODE, RIGHT MID OBTURATOR; EXCISION:  - NO TUMOR SEEN IN ONE LYMPH NODE (0/1).   B. SENTINEL LYMPH NODE, RIGHT EXTERNAL ILIAC; EXCISION:  - NO TUMOR SEEN IN ONE LYMPH NODE (0/1).   C. SENTINEL LYMPH NODE, LEFT EXTERNAL ILIAC; EXCISION:  - NO TUMOR SEEN IN ONE LYMPH NODE (0/1).   D. UTERUS WITH CERVIX, BILATERAL FALLOPIAN TUBES AND OVARIES;  HYSTERECTOMY AND BILATERAL SALPINGO-OOPHORECTOMY:  - SEROUS CARCINOMA, 4.5 CM(SEE NOTE).  - LYMPHOVASCULAR INVASION PRESENT.  - NO TUMOR SEEN IN BILATERAL OVARIES AND FALLOPIAN TUBES.  ENDOMETRIUM:  Procedure: Hysterectomy with bilateral salpingo-oophorectomy  Histologic Type: Serous carcinoma  Myometrial Invasion: Present       Depth of invasion (millimeters): 13 mm  Myometrial thickness (millimeters): 14 mm       Percentage of myometrial invasion: 93%  Uterine Serosa Involvement: Not identified  Cervical Stromal Involvement: Not identified    BIOMARKER REPORTING TEMPLATE:  p53 Expression: Abnormal strong diffuse overexpression (>90%)  ER/PR negative.  MS stable Her2neu assessment was performed given serous histology and was negative.   CT scan on 09/10/2016 with findings that are very reassuring so stent removed. 1. Interval resolution of abscess involving  the vaginal cuff. 2. The proximal end of the right ureteral stent is coiled in the proximal right ureter. No hydronephrosis. 3. No evidence of metastatic disease or other significant changes. 4. Aortic atherosclerosis.   She was referred to Dr. Janese Banks for adjuvant chemotherapy and completed 3 cycles of carbo/taxol. Notable side effects include neuropathy in hands due to prior chemo in past for Hodgkin's disease and more recent regimen.  Taxol dose was reduced 25% with cycle 3 due to neuropathy.    Whole pelvis radiation with Dr Baruch Gouty completed in August 2018.  CT A/P 08/31/2017 IMPRESSION: No evidence of recurrent or metastatic disease.  10/06/2017 PET  IMPRESSION: NEGATIVE FOR MALIGNANCY 1. No findings of active malignancy in the neck, chest, abdomen, or pelvis. 2. Subacute sacral insufficiency fracture with transverse component at S1-2 and vertical components in both sacral ala. There is also a nondisplaced subacute fracture of the right L5 transverse process. Low-grade associated metabolic activity favors a benign etiology. 3. Other imaging findings of potential clinical significance: Aortic Atherosclerosis (ICD10-I70.0). Coronary atherosclerosis. Mild cardiomegaly. Stable paramediastinal fibrosis. Sigmoid diverticulosis.  She has continued back pain with prior hx of sacral insufficiency fracture and fracture of L5 transverse process. She was evaluated for this and declined intervention  Problem List: Patient Active Problem List   Diagnosis Date Noted  . Right anterior knee pain 07/25/2019  . Educated about COVID-19 virus infection 11/15/2018  . Cramps, muscle, general 04/06/2018  . Chronic kidney disease, stage 2, mildly decreased GFR 10/05/2017  . B12 deficiency anemia 07/24/2017  . Normocytic anemia 07/18/2017  .  Back pain 07/18/2017  . Chemotherapy-induced peripheral neuropathy ([place]) 12/10/2016  . Hyperlipidemia 08/13/2016  . Hx of colonic polyps 08/13/2016  . Benign neoplasm  of sigmoid colon 08/13/2016  . Endometrial cancer ([place]) 08/13/2016  . 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 Lymphedema 07/20/2016  . Chronic venous insufficiency 07/20/2016  . PAD (peripheral artery disease) ([place]) 07/20/2016  . Postmenopausal bleeding 07/14/2016  . Class 1 obesity due to excess calories without serious comorbidity with body mass index (BMI) of 34.0 to 34.9 in adult 05/27/2016  . Leg cramps 05/27/2016  . Bronchiectasis ([place]) 12/19/2015  . Pre-syncope 12/19/2015  . Prolapsed, uterovaginal, incomplete 06/24/2014  . Routine general medical examination at a health care facility 12/30/2012  . Obesity (BMI 30-39.9) 12/30/2012  . Hx of multiple pulmonary nodules 12/28/2012  . Plantar fasciitis of right foot 12/28/2012  . Neuropathy associated with lymphoma ([place]) 09/12/2012  . Hip pain, bilateral 09/12/2012  . History of Hodgkin's lymphoma 09/12/2012  . Coronary artery disease   . Chest pain on exertion 02/29/2012    Past Medical History: Past Medical History:  Diagnosis Date  . Anxiety   . Cervicalgia   . Coronary artery disease    a. 02/2012 Stress echo: severe anterior wall ischemia;  b. 02/2012 Cath/PCI: LAD 95p (3.0 x 15 Xience EX DES), D1 90ost (PTCA - bifurcational dzs), EF 45% with anterior HK;  b. 02/2013 Ex MV: fixed anterior defect w/ minor reversibility, nl EF-->Med Rx.  . Endometrial cancer ([place])    a. 07/2016 s/p robotic hysterectomy, BSO w/ washings, sentinel node inj, mapping, bx, adhesiolysis.  . Essential hypertension, benign   . Fibrocystic breast disease   . GERD (gastroesophageal reflux disease)   . Gestational hypertension   . Heart murmur   . History of anemia   . History of blood transfusion   . Hodgkin's lymphoma ([place]) 2011   a. s/p radiation and chemo therapy  . Osteoarthritis   . Polycystic ovarian disease     Past Surgical History: Past Surgical History:  Procedure Laterality Date  . ABDOMINAL HYSTERECTOMY     . bladder sling    . CARDIAC CATHETERIZATION  02/2012   [facility] 1 stent place  . CERVICAL POLYPECTOMY    . CHOLECYSTECTOMY  1982  . COLONOSCOPY WITH PROPOFOL N/A 02/05/2015   Procedure: COLONOSCOPY WITH PROPOFOL;  Surgeon: Lucilla Lame, MD;  Location: [facility] ENDOSCOPY;  Service: Endoscopy;  Laterality: N/A;  . CORONARY ANGIOPLASTY  02/2012   left/right s/p balloon  . CYSTOGRAM N/A 08/17/2016   Procedure: CYSTOGRAM;  Surgeon: Hollice Espy, MD;  Location: [facility] ORS;  Service: Urology;  Laterality: N/A;  . CYSTOSCOPY N/A 08/17/2016   Procedure: CYSTOSCOPY EXAM UNDER ANESTHESIA;  Surgeon: Hollice Espy, MD;  Location: [facility] ORS;  Service: Urology;  Laterality: N/A;  . CYSTOSCOPY W/ RETROGRADES Bilateral 08/17/2016   Procedure: CYSTOSCOPY WITH RETROGRADE PYELOGRAM;  Surgeon: Hollice Espy, MD;  Location: [facility] ORS;  Service: Urology;  Laterality: Bilateral;  . CYSTOSCOPY WITH STENT PLACEMENT Right 08/17/2016   Procedure: CYSTOSCOPY WITH STENT PLACEMENT;  Surgeon: Hollice Espy, MD;  Location: [facility] ORS;  Service: Urology;  Laterality: Right;  . heart stent'  2013  . kidney stent Right 2018  . LYMPH NODE BIOPSY  2011   diagnosis of hodgkins lymphoma  . PELVIC LYMPH NODE DISSECTION N/A 07/29/2016   Procedure: PELVIC/AORTIC LYMPH NODE SAMPLING;  Surgeon: [name], MD;  Location: [facility] ORS;  Service: Gynecology;  Laterality: N/A;  . PORTA  CATH INSERTION N/A 09/22/2016   Procedure: Glori Luis Cath Insertion;  Surgeon: [name], MD;  Location: [place] [facility];  Service: Cardiovascular;  Laterality: N/A;  . PORTA CATH REMOVAL N/A 11/17/2016   Procedure: Glori Luis Cath Removal;  Surgeon: [name], MD;  Location: [place] [facility];  Service: Cardiovascular;  Laterality: N/A;  . ROBOTIC ASSISTED TOTAL HYSTERECTOMY WITH BILATERAL SALPINGO OOPHERECTOMY N/A 07/29/2016   Procedure: ROBOTIC ASSISTED TOTAL HYSTERECTOMY WITH BILATERAL SALPINGO OOPHORECTOMY;  Surgeon: [name], MD;   Location: [facility] ORS;  Service: Gynecology;  Laterality: N/A;  . SENTINEL NODE BIOPSY N/A 07/29/2016   Procedure: SENTINEL NODE BIOPSY;  Surgeon: [name], MD;  Location: [facility] OR[facility];  Service: Gynecology;  Laterality: N/A;  . transobturator sling N/A 2009   [place]    Family History: Family History  Problem Relation Age of Onset  . ALS Father   . Polymyositis Father   . Diabetes Brother   . Cancer Maternal Aunt        breast  . Breast cancer Maternal Aunt        30's  . Stroke Maternal Grandmother   . Cancer Maternal Grandfather        prostate  . Stroke Maternal Grandfather   . Colon cancer Maternal Aunt   . Non-Hodgkin's lymphoma Cousin     Social History: Works as Dealer Social History   Socioeconomic History  . Marital status: Widowed    Spouse name: Not on file  . Number of children: Not on file  . Years of education: Not on file  . Highest education level: Not on file  Occupational History  . Not on file  Tobacco Use  . Smoking status: Former Smoker    Packs/day: 1.00    Years: 30.00    Pack years: 30.00    Types: Cigarettes    Quit date: 07/24/2002    Years since quitting: 17.0  . Smokeless tobacco: Never Used  . Tobacco comment: quit smoking in 2000  Substance and Sexual Activity  . Alcohol use: Yes    Comment: rarely-liquor  . Drug use: No  . Sexual activity: Never  Other Topics Concern  . 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    Social Determinants of Health   Financial Resource Strain:   . Difficulty of Paying Living Expenses: Not on file  Food Insecurity:   . Worried About Charity fundraiser in the Last Year: Not on file  . Ran Out of Food in the Last Year: Not on file  Transportation Needs:   . Lack of Transportation (Medical): Not on file  . Lack of Transportation (Non-Medical): Not on file  Physical Activity:   . Days of  Exercise per Week: Not on file  . Minutes of Exercise per Session: Not on file  Stress:   . Feeling of Stress : Not on file  Social Connections:   . Frequency of Communication with Friends and Family: Not on file  . Frequency of Social Gatherings with Friends and Family: Not on file  . Attends Religious Services: Not on file  . Active Member of Clubs or Organizations: Not on file  . Attends Archivist Meetings: Not on file  . Marital Status: Not on file  Intimate Partner Violence:   . Fear of Current or Ex-Partner: Not on file  .  Emotionally Abused: Not on file  . Physically Abused: Not on file  . Sexually Abused: Not on file    Allergies: Allergies  Allergen Reactions  . Adhesive [Tape] Other (See Comments)    Burns the skin  . Z-Pak [Azithromycin] Itching  . Antifungal [Miconazole Nitrate] Rash  . Sulfa Antibiotics Nausea And Vomiting and Rash    N/T  . Tolnaftate Rash    Current Medications: Current Outpatient Medications  Medication Sig Dispense Refill  . acetaminophen (TYLENOL) 500 MG tablet Take 1,000 mg by mouth every 6 (six) hours as needed for mild pain.     Marland Kitchen aspirin EC 81 MG tablet Take 81 mg by mouth at bedtime.    Marland Kitchen atorvastatin (LIPITOR) 10 MG tablet Take 1 tablet by mouth once daily 90 tablet 2  . Cholecalciferol (VITAMIN D3) 2000 units TABS Take 2,000 Units by mouth 3 (three) times a week.    . cyanocobalamin (,VITAMIN B-12,) 1000 MCG/ML injection INJECT 1ML IM WEEKLY FOR 4 WEEKS, THEN INJECT MONTHLY THEREAFTER 10 mL 0  . diazepam (VALIUM) 5 MG tablet Take 1 tablet (5 mg total) by mouth every 12 (twelve) hours as needed for anxiety. 30 tablet 0  . DULoxetine (CYMBALTA) 30 MG capsule Take 1 capsule by mouth once daily 90 capsule 0  . metoprolol tartrate (LOPRESSOR) 25 MG tablet TAKE 1 TABLET BY MOUTH TWICE DAILY 180 tablet 3  . nitroGLYCERIN (NITROSTAT) 0.4 MG SL tablet Place 1 tablet (0.4 mg total) under the tongue every 5 (five) minutes as needed  for chest pain. 25 tablet 3  . Syringe/Needle, Disp, (SYRINGE 3CC/25GX1") 25G X 1" 3 ML MISC Use for b12 injections 50 each 0  . Turmeric 500 MG CAPS Take 500 mg by mouth daily.      No current facility-administered medications for this visit.    Review of Systems General: fatigue  HEENT: no complaints  Lungs: no complaints  Cardiac: no complaints  GI: no complaints  GU: no complaints of bladder dysfunction today  Musculoskeletal: leg cramps noted below  Extremities: cramps in legs, chronic  Skin: no complaints  Neuro: peripheral neuropathy, chronic  Endocrine: no complaints  Psych: feeling sad     Objective:  Physical Examination:  BP (!) 120/47 (BP Location: Left Arm, Patient Position: Sitting)   Pulse 83   Temp (!) 97.3 F (36.3 C) (Tympanic)   Resp 17   Wt 166 lb 3.2 oz (75.4 kg)   SpO2 98%   BMI 31.40 kg/m    ECOG Performance Status: 1 - Symptomatic but completely ambulatory  GENERAL: Patient is a well appearing female in no acute distress HEENT:  PERRL, neck supple with midline trachea. Thyroid without masses.  NODES:  No cervical, supraclavicular, axillary, or inguinal lymphadenopathy palpated.  LUNGS:  Clear to auscultation bilaterally.  No wheezes or rhonchi. HEART:  Regular rate and rhythm. No murmur appreciated. ABDOMEN:  Soft, nontender.  Positive, normoactive bowel sounds.  MSK:  No focal spinal tenderness to palpation. Full range of motion bilaterally in the upper extremities. EXTREMITIES:  No peripheral edema.   SKIN:  Clear with no obvious rashes or skin changes. No nail dyscrasia. NEURO:  Nonfocal. Well oriented.  Appropriate affect.  Pelvic: EGBUS: no lesions Cervix: no lesions, nontender, mobile Vagina: no lesions, no discharge or bleeding Uterus: normal size, nontender, mobile Adnexa: no palpable masses Rectovaginal:  deferred    Assessment:  Jaime Hall is a 69 y.o. female diagnosed with stage IB high grade serous  endometrial cancer,  high intermediate risk disease. 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 Complete resolution of vaginal cuff abscess.  Severe pelvic adhesive disease.  Now s/p 3 cycles of carboplatin/taxol and whole pelvic radiation. Imaging and exam  NED.   Multiple complaints, most likely not malignancy related. Could potentially be due to prior therapy.   Medical co-morbidities complicating care: 30 pack year former smoker s/p coronary angioplasty and stents 2013 and takes baby ASA. Body mass index is 31.4 kg/m.  Plan:   Problem List Items Addressed This Visit    None    Visit Diagnoses    History of endometrial cancer    -  Primary      Continue surveillance visit including pelvic exams every 4 months for this 3rd year, then every 6-12 months for 3-5 years and then annually thereafter. She will see Dr. Leonides Schanz in 4 months and return to our clinic in 8 months. She will follow up with Dr. Janese Banks for follow up for lymphoma.   I personally had a face to face interaction and evaluated the patient jointly with the NP, Ms. Beckey Rutter.  I have reviewed her history and available records and have performed the key portions of the physical exam including HEENT, abdominal exam, pelvic exam with my findings confirming those documented above by the APP.  I have discussed the case with the APP and the patient.  I agree with the above documentation, assessment and plan which was fully formulated by me.  Counseling was completed by me.   I personally saw the patient and performed a substantive portion of this encounter in conjunction with the listed APP as documented above.  A total of at least 20 minutes were spent with the patient/family today; >50% was spent in education, counseling and coordination of care for endometrial  cancer.  Lenore Moyano Gaetana Michaelis, MD   CC:  [name], MD [address] [place], [place] [address] [phone]  Larey Days, MD  Hollice Espy, MD

## 2019-08-02 NOTE — Patient Instructions (Signed)
Please call Dr. Guido Sander office to schedule an appointment in 4 months (around 11/2019) and we will plan to see you back in 8 months. If you have any concerning symptoms in the interim, please call the clinic so we can see you sooner.

## 2019-08-03 ENCOUNTER — Other Ambulatory Visit: Payer: Self-pay | Admitting: Cardiovascular Disease

## 2019-08-03 ENCOUNTER — Telehealth: Payer: Self-pay | Admitting: *Deleted

## 2019-08-03 NOTE — Telephone Encounter (Signed)
Called patient. She was exposed on 2/12. She was wearing PPE but did have close contact with covid positive individual. She is asymptomatic and is awaiting her test results. Precautions discussed and I asked her to please notify clinic if her test comes back positive or if she becomes symptomatic.

## 2019-08-04 ENCOUNTER — Telehealth: Payer: Self-pay | Admitting: *Deleted

## 2019-08-04 NOTE — Telephone Encounter (Signed)
Patient called reporting that her COVID test has come back negative

## 2019-08-20 ENCOUNTER — Ambulatory Visit: Payer: PPO | Attending: Internal Medicine

## 2019-08-20 DIAGNOSIS — Z23 Encounter for immunization: Secondary | ICD-10-CM | POA: Insufficient documentation

## 2019-08-20 NOTE — Progress Notes (Signed)
   Covid-19 Vaccination Clinic  Name:  Jaime Hall    MRN: [id number] DOB: 11-Nov-1950  08/20/2019  [name] was observed post Covid-19 immunization for 15 minutes without incident. She was provided with Vaccine Information Sheet and instruction to access the V-Safe system.   [name] was instructed to call 911 with any severe reactions post vaccine: Marland Kitchen Difficulty breathing  . Swelling of face and throat  . A fast heartbeat  . A bad rash all over body  . Dizziness and weakness   Immunizations Administered    Name Date Dose VIS Date Route   Pfizer COVID-19 Vaccine 08/20/2019 12:01 PM 0.3 mL 05/26/2019 Intramuscular   Manufacturer: [place]   Lot: [id number]   [place]: [id number]

## 2019-08-21 DIAGNOSIS — M25561 Pain in right knee: Secondary | ICD-10-CM | POA: Diagnosis not present

## 2019-08-21 DIAGNOSIS — M9904 Segmental and somatic dysfunction of sacral region: Secondary | ICD-10-CM | POA: Diagnosis not present

## 2019-08-21 DIAGNOSIS — M2241 Chondromalacia patellae, right knee: Secondary | ICD-10-CM | POA: Diagnosis not present

## 2019-08-21 DIAGNOSIS — M9906 Segmental and somatic dysfunction of lower extremity: Secondary | ICD-10-CM | POA: Diagnosis not present

## 2019-08-21 DIAGNOSIS — M9903 Segmental and somatic dysfunction of lumbar region: Secondary | ICD-10-CM | POA: Diagnosis not present

## 2019-08-21 DIAGNOSIS — M545 Low back pain: Secondary | ICD-10-CM | POA: Diagnosis not present

## 2019-08-24 DIAGNOSIS — I10 Essential (primary) hypertension: Secondary | ICD-10-CM | POA: Diagnosis not present

## 2019-08-24 DIAGNOSIS — N183 Chronic kidney disease, stage 3 unspecified: Secondary | ICD-10-CM | POA: Diagnosis not present

## 2019-08-24 DIAGNOSIS — N281 Cyst of kidney, acquired: Secondary | ICD-10-CM | POA: Diagnosis not present

## 2019-08-28 DIAGNOSIS — I10 Essential (primary) hypertension: Secondary | ICD-10-CM | POA: Diagnosis not present

## 2019-08-28 DIAGNOSIS — N1832 Chronic kidney disease, stage 3b: Secondary | ICD-10-CM | POA: Diagnosis not present

## 2019-08-28 DIAGNOSIS — I890 Lymphedema, not elsewhere classified: Secondary | ICD-10-CM | POA: Diagnosis not present

## 2019-09-05 MED ORDER — CYANOCOBALAMIN 1000 MCG/ML IJ SOLN: INTRAMUSCULAR | 0 refills | Status: DC

## 2019-09-11 DIAGNOSIS — N1832 Chronic kidney disease, stage 3b: Secondary | ICD-10-CM | POA: Diagnosis not present

## 2019-09-12 ENCOUNTER — Ambulatory Visit: Payer: PPO | Attending: Internal Medicine

## 2019-09-12 DIAGNOSIS — Z23 Encounter for immunization: Secondary | ICD-10-CM

## 2019-09-12 NOTE — Progress Notes (Signed)
   Covid-19 Vaccination Clinic  Name:  Jaime Hall    MRN: [id number] DOB: 1950/12/13  09/12/2019  [name] was observed post Covid-19 immunization for 15 minutes without incident. She was provided with Vaccine Information Sheet and instruction to access the V-Safe system.   [name] was instructed to call 911 with any severe reactions post vaccine: Marland Kitchen Difficulty breathing  . Swelling of face and throat  . A fast heartbeat  . A bad rash all over body  . Dizziness and weakness   Immunizations Administered    Name Date Dose VIS Date Route   Pfizer COVID-19 Vaccine 09/12/2019  3:18 PM 0.3 mL 05/26/2019 Intramuscular   Manufacturer: [place]   Lot: [id number]   [place]: [id number]

## 2019-09-28 ENCOUNTER — Other Ambulatory Visit: Payer: Self-pay

## 2019-10-02 ENCOUNTER — Other Ambulatory Visit: Payer: Self-pay

## 2019-10-02 ENCOUNTER — Ambulatory Visit (INDEPENDENT_AMBULATORY_CARE_PROVIDER_SITE_OTHER): Payer: PPO | Admitting: Internal Medicine

## 2019-10-02 ENCOUNTER — Encounter: Payer: Self-pay | Admitting: Internal Medicine

## 2019-10-02 VITALS — BP 120/60 | HR 78 | Temp 97.4°F | Resp 15 | Ht 61.0 in | Wt 166.0 lb

## 2019-10-02 DIAGNOSIS — R1031 Right lower quadrant pain: Secondary | ICD-10-CM | POA: Diagnosis not present

## 2019-10-02 DIAGNOSIS — R109 Unspecified abdominal pain: Secondary | ICD-10-CM

## 2019-10-02 DIAGNOSIS — B379 Candidiasis, unspecified: Secondary | ICD-10-CM

## 2019-10-02 LAB — POCT URINALYSIS DIPSTICK
Bilirubin, UA: NEGATIVE
Blood, UA: NEGATIVE
Glucose, UA: NEGATIVE
Ketones, UA: NEGATIVE
Leukocytes, UA: NEGATIVE
Nitrite, UA: NEGATIVE
Protein, UA: NEGATIVE
Spec Grav, UA: >=1.03 — AB (ref 1.010–1.025)
Urobilinogen, UA: 0.2 E.U./dL
pH, UA: 6 (ref 5.0–8.0)

## 2019-10-02 MED ORDER — NYSTATIN 100000 UNIT/GM EX POWD: 1.0000 "application " | Freq: Two times a day (BID) | CUTANEOUS | 0 refills | Status: DC

## 2019-10-02 NOTE — Patient Instructions (Signed)
You have a yeast infection in your skin folds.  KEEP IT DRY AND USE THE NYSTATIN POWDER TWICE DAILY UNTIL GONE.   KEEP THE AREA FROM CREATING A MOIST SEAL UNTO ITSELF WITH A SOFT COTTON PAD   THEN USE GOLD BOND MEDICATED POWDER WITH ZINC TO KEEP IT AWAY  I AM ORDERING A CT SCAN OF YOUR ABDOMEN

## 2019-10-02 NOTE — Progress Notes (Signed)
Subjective:  Patient ID: Jaime Hall, female    DOB: 1951/05/02  Age: 69 y.o. MRN: [id number]  CC: The primary encounter diagnosis was Flank pain. Diagnoses of Right lower quadrant abdominal pain and Candidiasis were also pertinent to this visit.  HPI Jaime Hall presents for follow up on lower abdominal pain  Right side.  History of transobturator sling by [place] [employer] History of TAH/BSO 2018  For endometrial CA  Left sided flank Pain started on Wednesday,  Tender to touch,  Acc by urinary frequency lasting 1-2 days.  Pain has since them moved to lower abdomen . Feels like"something has moved inside me." denies hematuria, diarrhea, nausea.   . Patient has received both doses of the available COVID 19 vaccine without complications.  Patient continues to mask when outside of the home except when walking in yard or at safe distances from others .  Patient denies any change in mood or development of unhealthy behaviors resuting from the pandemic's restriction of activities and socialization.    Outpatient Medications Prior to Visit  Medication Sig Dispense Refill  . acetaminophen (TYLENOL) 500 MG tablet Take 1,000 mg by mouth every 6 (six) hours as needed for mild pain.     Marland Kitchen aspirin EC 81 MG tablet Take 81 mg by mouth at bedtime.    Marland Kitchen atorvastatin (LIPITOR) 10 MG tablet Take 1 tablet by mouth once daily 90 tablet 2  . Cholecalciferol (VITAMIN D3) 2000 units TABS Take 2,000 Units by mouth 3 (three) times a week.    . cyanocobalamin (,VITAMIN B-12,) 1000 MCG/ML injection INJECT 1ML IM WEEKLY FOR 4 WEEKS, THEN INJECT MONTHLY THEREAFTER 10 mL 0  . diazepam (VALIUM) 5 MG tablet Take 1 tablet (5 mg total) by mouth every 12 (twelve) hours as needed for anxiety. 30 tablet 0  . DULoxetine (CYMBALTA) 30 MG capsule Take 1 capsule by mouth once daily 90 capsule 0  . metoprolol tartrate (LOPRESSOR) 25 MG tablet Take 1 tablet by mouth twice daily 180 tablet 2  . nitroGLYCERIN (NITROSTAT)  0.4 MG SL tablet Place 1 tablet (0.4 mg total) under the tongue every 5 (five) minutes as needed for chest pain. 25 tablet 3  . Syringe/Needle, Disp, (SYRINGE 3CC/25GX1") 25G X 1" 3 ML MISC Use for b12 injections 50 each 0  . Turmeric 500 MG CAPS Take 500 mg by mouth daily.      No facility-administered medications prior to visit.    Review of Systems;  Patient denies headache, fevers, malaise, unintentional weight loss, skin rash, eye pain, sinus congestion and sinus pain, sore throat, dysphagia,  hemoptysis , cough, dyspnea, wheezing, chest pain, palpitations, orthopnea, edema,  nausea, melena, diarrhea, constipation,, dysuria, hematuria, urinary  Frequency, nocturia, numbness, tingling, seizures,  Focal weakness, Loss of consciousness,  Tremor, insomnia, depression, anxiety, and suicidal ideation.      Objective:  BP 120/60 (BP Location: Left Arm, Patient Position: Sitting, Cuff Size: Normal)   Pulse 78   Temp (!) 97.4 F (36.3 C) (Temporal)   Resp 15   Ht 5\' 1"  (1.549 m)   Wt 166 lb (75.3 kg)   SpO2 97%   BMI 31.37 kg/m   BP Readings from Last 3 Encounters:  10/02/19 120/60  08/02/19 (!) 120/47  04/21/19 110/60    Wt Readings from Last 3 Encounters:  10/02/19 166 lb (75.3 kg)  08/02/19 166 lb 3.2 oz (75.4 kg)  07/25/19 167 lb (75.8 kg)   . General Appearance:  Alert, cooperative, no distress, appears stated age  Head:    Normocephalic, without obvious abnormality, atraumatic  Eyes:    PERRL, conjunctiva/corneas clear, EOM's intact, fundi    benign, both eyes  Ears:    Normal TM's and external ear canals, both ears  Nose:   Nares normal, septum midline, mucosa normal, no drainage    or sinus tenderness  Throat:   Lips, mucosa, and tongue normal; teeth and gums normal  Neck:   Supple, symmetrical, trachea midline, no adenopathy;    thyroid:  no enlargement/tenderness/nodules; no carotid   bruit or JVD  Back:     Symmetric, no curvature, ROM normal, no CVA  tenderness  Lungs:     Clear to auscultation bilaterally, respirations unlabored  Chest Wall:    No tenderness or deformity   Heart:    Regular rate and rhythm, S1 and S2 normal, no murmur, rub   or gallop  Breast Exam:    No tenderness, masses, or nipple abnormality  Abdomen:     Soft, non-tender, bowel sounds active all four quadrants,    no masses, no organomegaly,  Erythematous rash in folds of pannus   Genitalia:    Pelvic: external genitalia normal, no adnexal masses or tenderness, cervix and uterus surgically absent , rectovaginal septum normal, and vagina normal without discharge  Extremities:   Extremities normal, atraumatic, no cyanosis or edema  Pulses:   2+ and symmetric all extremities  Skin:   Skin color, texture, turgor normal, no rashes or lesions  Lymph nodes:   Cervical, supraclavicular, and axillary nodes normal  Neurologic:   CNII-XII intact, normal strength, sensation and reflexes    throughout    Lab Results  Component Value Date   HGBA1C 6.0 07/27/2019   HGBA1C 14.1 08/08/2018   HGBA1C 5.8 05/26/2016    Lab Results  Component Value Date   CREATININE 1.33 (H) 10/02/2019   CREATININE 1.13 07/27/2019   CREATININE 1.2 (A) 02/15/2019    Lab Results  Component Value Date   WBC 10.3 10/02/2019   HGB 12.1 10/02/2019   HCT 36.3 10/02/2019   PLT 244.0 10/02/2019   GLUCOSE 80 10/02/2019   CHOL 121 12/27/2018   TRIG 71 12/27/2018   HDL 61 12/27/2018   LDLCALC 46 12/27/2018   ALT 10 10/02/2019   AST 19 10/02/2019   NA 140 10/02/2019   K 4.3 10/02/2019   CL 105 10/02/2019   CREATININE 1.33 (H) 10/02/2019   BUN 31 (H) 10/02/2019   CO2 28 10/02/2019   TSH 4.20 04/05/2018   INR 1.02 08/05/2016   HGBA1C 6.0 07/27/2019   MICROALBUR 2.6 (H) 10/04/2017    US Venous Img Lower Unilateral Right (DVT)  Result Date: 07/24/2019 CLINICAL DATA:  History of endometrial cancer and obesity. New onset RIGHT knee pain and swelling. Evaluate for DVT or cysts. EXAM: RIGHT  LOWER EXTREMITY VENOUS DOPPLER ULTRASOUND TECHNIQUE: Gray-scale sonography with compression, as well as color and duplex ultrasound, were performed to evaluate the deep venous system(s) from the level of the common femoral vein through the popliteal and proximal calf veins. COMPARISON:  None. FINDINGS: VENOUS Normal compressibility of the common femoral, superficial femoral, and popliteal veins, as well as the visualized calf veins. Visualized portions of profunda femoral vein and great saphenous vein unremarkable. No filling defects to suggest DVT on grayscale or color Doppler imaging. Doppler waveforms show normal direction of venous flow, normal respiratory phasicity and response to augmentation. Limited views of the contralateral common  femoral vein are unremarkable. OTHER None. Limitations: none IMPRESSION: No femoropopliteal DVT nor evidence of DVT within the visualized calf veins. If clinical symptoms are inconsistent or if there are persistent or worsening symptoms, further imaging (possibly involving the iliac veins) may be warranted. Electronically Signed   By: Nolon Nations M.D.   On: 07/24/2019 17:26    Assessment & Plan:   Problem List Items Addressed This Visit      Unprioritized   Flank pain - Primary    No evidence of mass on exam but exam limited by significant pannus  Pelvic  exam normal; no hematuria or signs of infection on UA/culture .given her history of endometrial  CA  CT scan ordered        Relevant Orders   POCT Urinalysis Dipstick (Completed)   Urine Culture (Completed)   Urine Microscopic Only (Completed)   Comprehensive metabolic panel (Completed)   CBC with Differential/Platelet (Completed)   CT Abdomen Pelvis W Contrast   Right lower quadrant abdominal pain   Relevant Orders   CT Abdomen Pelvis W Contrast   Candidiasis    Nystatin powder bid to affected skin folds.       Relevant Medications   nystatin (MYCOSTATIN/NYSTOP) powder      I am having  Jaime Hall start on nystatin. I am also having her maintain her nitroGLYCERIN, aspirin EC, Turmeric, Vitamin D3, acetaminophen, diazepam, atorvastatin, SYRINGE 3CC/25GX1", DULoxetine, metoprolol tartrate, and cyanocobalamin.  Meds ordered this encounter  Medications  . nystatin (MYCOSTATIN/NYSTOP) powder    Sig: Apply 1 application topically 2 (two) times daily. To rash until resolved.    Dispense:  30 g    Refill:  0    There are no discontinued medications.  Follow-up: No follow-ups on file.   [name], MD

## 2019-10-03 ENCOUNTER — Ambulatory Visit: Payer: PPO | Admitting: Nurse Practitioner

## 2019-10-03 LAB — CBC WITH DIFFERENTIAL/PLATELET
Basophils Absolute: 0.1 10*3/uL (ref 0.0–0.1)
Basophils Relative: 1.2 % (ref 0.0–3.0)
Eosinophils Absolute: 0.2 10*3/uL (ref 0.0–0.7)
Eosinophils Relative: 1.5 % (ref 0.0–5.0)
HCT: 36.3 % (ref 36.0–46.0)
Hemoglobin: 12.1 g/dL (ref 12.0–15.0)
Lymphocytes Relative: 9.2 % — ABNORMAL LOW (ref 12.0–46.0)
Lymphs Abs: 0.9 10*3/uL (ref 0.7–4.0)
MCHC: 33.5 g/dL (ref 30.0–36.0)
MCV: 88.5 fl (ref 78.0–100.0)
Monocytes Absolute: 0.6 10*3/uL (ref 0.1–1.0)
Monocytes Relative: 6.1 % (ref 3.0–12.0)
Neutro Abs: 8.4 10*3/uL — ABNORMAL HIGH (ref 1.4–7.7)
Neutrophils Relative %: 82 % — ABNORMAL HIGH (ref 43.0–77.0)
Platelets: 244 10*3/uL (ref 150.0–400.0)
RBC: 4.1 Mil/uL (ref 3.87–5.11)
RDW: 14 % (ref 11.5–15.5)
WBC: 10.3 10*3/uL (ref 4.0–10.5)

## 2019-10-03 LAB — COMPREHENSIVE METABOLIC PANEL
ALT: 10 U/L (ref 0–35)
AST: 19 U/L (ref 0–37)
Albumin: 4 g/dL (ref 3.5–5.2)
Alkaline Phosphatase: 87 U/L (ref 39–117)
BUN: 31 mg/dL — ABNORMAL HIGH (ref 6–23)
CO2: 28 mEq/L (ref 19–32)
Calcium: 9.5 mg/dL (ref 8.4–10.5)
Chloride: 105 mEq/L (ref 96–112)
Creatinine, Ser: 1.33 mg/dL — ABNORMAL HIGH (ref 0.40–1.20)
GFR: 39.44 mL/min — ABNORMAL LOW (ref >60.00)
Glucose, Bld: 80 mg/dL (ref 70–99)
Potassium: 4.3 mEq/L (ref 3.5–5.1)
Sodium: 140 mEq/L (ref 135–145)
Total Bilirubin: 0.6 mg/dL (ref 0.2–1.2)
Total Protein: 6.5 g/dL (ref 6.0–8.3)

## 2019-10-03 LAB — URINE CULTURE
MICRO NUMBER:: 10379042
SPECIMEN QUALITY:: ADEQUATE

## 2019-10-03 LAB — URINALYSIS, MICROSCOPIC ONLY: RBC / HPF: NONE SEEN (ref 0–?)

## 2019-10-04 DIAGNOSIS — R1031 Right lower quadrant pain: Secondary | ICD-10-CM | POA: Insufficient documentation

## 2019-10-04 DIAGNOSIS — R109 Unspecified abdominal pain: Secondary | ICD-10-CM | POA: Insufficient documentation

## 2019-10-04 DIAGNOSIS — B379 Candidiasis, unspecified: Secondary | ICD-10-CM | POA: Insufficient documentation

## 2019-10-04 NOTE — Assessment & Plan Note (Signed)
Nystatin powder bid to affected skin folds.

## 2019-10-04 NOTE — Assessment & Plan Note (Signed)
No evidence of mass on exam but exam limited by significant pannus  Pelvic  exam normal; no hematuria or signs of infection on UA/culture .given her history of endometrial  CA  CT scan ordered

## 2019-10-16 ENCOUNTER — Encounter: Payer: Self-pay | Admitting: Internal Medicine

## 2019-10-16 ENCOUNTER — Telehealth: Payer: Self-pay | Admitting: Internal Medicine

## 2019-10-16 ENCOUNTER — Ambulatory Visit
Admission: RE | Admit: 2019-10-16 | Discharge: 2019-10-16 | Disposition: A | Discharge status: Home / Self Care | Payer: PPO | Source: Ambulatory Visit | Attending: Internal Medicine | Admitting: Internal Medicine

## 2019-10-16 ENCOUNTER — Telehealth: Payer: Self-pay

## 2019-10-16 ENCOUNTER — Other Ambulatory Visit: Payer: Self-pay

## 2019-10-16 ENCOUNTER — Other Ambulatory Visit: Payer: Self-pay | Admitting: Oncology

## 2019-10-16 ENCOUNTER — Other Ambulatory Visit: Payer: Self-pay | Admitting: *Deleted

## 2019-10-16 DIAGNOSIS — R109 Unspecified abdominal pain: Secondary | ICD-10-CM | POA: Diagnosis not present

## 2019-10-16 DIAGNOSIS — R1031 Right lower quadrant pain: Secondary | ICD-10-CM | POA: Diagnosis not present

## 2019-10-16 DIAGNOSIS — C786 Secondary malignant neoplasm of retroperitoneum and peritoneum: Secondary | ICD-10-CM

## 2019-10-16 DIAGNOSIS — C541 Malignant neoplasm of endometrium: Secondary | ICD-10-CM

## 2019-10-16 DIAGNOSIS — R935 Abnormal findings on diagnostic imaging of other abdominal regions, including retroperitoneum: Secondary | ICD-10-CM

## 2019-10-16 MED ORDER — IOHEXOL 300 MG/ML  SOLN
75.0000 mL | Freq: Once | INTRAMUSCULAR | Status: AC | PRN
  Administered 2019-10-16: 75 mL via INTRAVENOUS

## 2019-10-16 NOTE — Telephone Encounter (Signed)
Tracey with [facility] radiology called to give CT results. Please call back @ [phone]

## 2019-10-16 NOTE — Telephone Encounter (Signed)
[name] seen the results and already spoken with the pt in regards to them.

## 2019-10-16 NOTE — Telephone Encounter (Signed)
Called and spoke with Jaime Hall regarding results of CT that was performed by [name]. She has received the results from [name]. We have scheduled a PET scan for 5-5 at 1230 with [facility]. Instructed not to eat or drink anything for 6 hours prior except for water. No gum/candy. Dr. Janese Banks will see her for results 5-6 at 0830. She will come at 0800 for labs. Gyn onc will see her 5-12 at 1000.

## 2019-10-17 ENCOUNTER — Other Ambulatory Visit: Payer: Self-pay | Admitting: Oncology

## 2019-10-17 ENCOUNTER — Other Ambulatory Visit: Payer: Self-pay | Admitting: *Deleted

## 2019-10-18 ENCOUNTER — Encounter
Admission: RE | Admit: 2019-10-18 | Discharge: 2019-10-18 | Disposition: A | Discharge status: Home / Self Care | Payer: PPO | Source: Ambulatory Visit | Attending: Oncology | Admitting: Oncology

## 2019-10-18 ENCOUNTER — Other Ambulatory Visit: Payer: Self-pay

## 2019-10-18 DIAGNOSIS — C786 Secondary malignant neoplasm of retroperitoneum and peritoneum: Secondary | ICD-10-CM | POA: Diagnosis not present

## 2019-10-18 DIAGNOSIS — C541 Malignant neoplasm of endometrium: Secondary | ICD-10-CM

## 2019-10-18 LAB — GLUCOSE, CAPILLARY: Glucose-Capillary: 65 mg/dL — ABNORMAL LOW (ref 70–99)

## 2019-10-18 MED ORDER — FLUDEOXYGLUCOSE F - 18 (FDG) INJECTION
8.6000 | Freq: Once | INTRAVENOUS | Status: AC | PRN
  Administered 2019-10-18: 8.44 via INTRAVENOUS

## 2019-10-19 ENCOUNTER — Other Ambulatory Visit: Payer: Self-pay | Admitting: *Deleted

## 2019-10-19 ENCOUNTER — Inpatient Hospital Stay: Payer: PPO

## 2019-10-19 ENCOUNTER — Inpatient Hospital Stay: Payer: PPO | Attending: Oncology | Admitting: Oncology

## 2019-10-19 VITALS — BP 113/48 | HR 68 | Temp 95.1°F | Resp 16 | Wt 162.0 lb

## 2019-10-19 DIAGNOSIS — Z820 Family history of epilepsy and other diseases of the nervous system: Secondary | ICD-10-CM | POA: Insufficient documentation

## 2019-10-19 DIAGNOSIS — Z803 Family history of malignant neoplasm of breast: Secondary | ICD-10-CM | POA: Insufficient documentation

## 2019-10-19 DIAGNOSIS — Z79899 Other long term (current) drug therapy: Secondary | ICD-10-CM | POA: Diagnosis not present

## 2019-10-19 DIAGNOSIS — Z923 Personal history of irradiation: Secondary | ICD-10-CM | POA: Diagnosis not present

## 2019-10-19 DIAGNOSIS — Z9049 Acquired absence of other specified parts of digestive tract: Secondary | ICD-10-CM | POA: Diagnosis not present

## 2019-10-19 DIAGNOSIS — C541 Malignant neoplasm of endometrium: Secondary | ICD-10-CM | POA: Diagnosis not present

## 2019-10-19 DIAGNOSIS — Z800 Family history of malignant neoplasm of digestive organs: Secondary | ICD-10-CM | POA: Insufficient documentation

## 2019-10-19 DIAGNOSIS — I6522 Occlusion and stenosis of left carotid artery: Secondary | ICD-10-CM | POA: Insufficient documentation

## 2019-10-19 DIAGNOSIS — Z90722 Acquired absence of ovaries, bilateral: Secondary | ICD-10-CM | POA: Diagnosis not present

## 2019-10-19 DIAGNOSIS — Z808 Family history of malignant neoplasm of other organs or systems: Secondary | ICD-10-CM | POA: Insufficient documentation

## 2019-10-19 DIAGNOSIS — Z882 Allergy status to sulfonamides status: Secondary | ICD-10-CM | POA: Diagnosis not present

## 2019-10-19 DIAGNOSIS — Z87891 Personal history of nicotine dependence: Secondary | ICD-10-CM | POA: Diagnosis not present

## 2019-10-19 DIAGNOSIS — Z90710 Acquired absence of both cervix and uterus: Secondary | ICD-10-CM | POA: Insufficient documentation

## 2019-10-19 DIAGNOSIS — Z833 Family history of diabetes mellitus: Secondary | ICD-10-CM | POA: Diagnosis not present

## 2019-10-19 DIAGNOSIS — N281 Cyst of kidney, acquired: Secondary | ICD-10-CM | POA: Diagnosis not present

## 2019-10-19 DIAGNOSIS — C801 Malignant (primary) neoplasm, unspecified: Secondary | ICD-10-CM

## 2019-10-19 DIAGNOSIS — T451X5A Adverse effect of antineoplastic and immunosuppressive drugs, initial encounter: Secondary | ICD-10-CM | POA: Diagnosis not present

## 2019-10-19 DIAGNOSIS — Z8042 Family history of malignant neoplasm of prostate: Secondary | ICD-10-CM | POA: Insufficient documentation

## 2019-10-19 DIAGNOSIS — R1903 Right lower quadrant abdominal swelling, mass and lump: Secondary | ICD-10-CM | POA: Diagnosis not present

## 2019-10-19 DIAGNOSIS — I700 Atherosclerosis of aorta: Secondary | ICD-10-CM | POA: Diagnosis not present

## 2019-10-19 DIAGNOSIS — Z7189 Other specified counseling: Secondary | ICD-10-CM | POA: Diagnosis not present

## 2019-10-19 DIAGNOSIS — Z881 Allergy status to other antibiotic agents status: Secondary | ICD-10-CM | POA: Insufficient documentation

## 2019-10-19 DIAGNOSIS — G620 Drug-induced polyneuropathy: Secondary | ICD-10-CM | POA: Diagnosis not present

## 2019-10-19 DIAGNOSIS — Z8571 Personal history of Hodgkin lymphoma: Secondary | ICD-10-CM | POA: Diagnosis not present

## 2019-10-19 DIAGNOSIS — K5730 Diverticulosis of large intestine without perforation or abscess without bleeding: Secondary | ICD-10-CM | POA: Insufficient documentation

## 2019-10-19 DIAGNOSIS — I129 Hypertensive chronic kidney disease with stage 1 through stage 4 chronic kidney disease, or unspecified chronic kidney disease: Secondary | ICD-10-CM | POA: Insufficient documentation

## 2019-10-19 DIAGNOSIS — Z888 Allergy status to other drugs, medicaments and biological substances status: Secondary | ICD-10-CM | POA: Insufficient documentation

## 2019-10-19 DIAGNOSIS — R160 Hepatomegaly, not elsewhere classified: Secondary | ICD-10-CM | POA: Insufficient documentation

## 2019-10-19 DIAGNOSIS — C786 Secondary malignant neoplasm of retroperitoneum and peritoneum: Secondary | ICD-10-CM | POA: Insufficient documentation

## 2019-10-19 DIAGNOSIS — Z823 Family history of stroke: Secondary | ICD-10-CM | POA: Insufficient documentation

## 2019-10-19 DIAGNOSIS — N182 Chronic kidney disease, stage 2 (mild): Secondary | ICD-10-CM | POA: Insufficient documentation

## 2019-10-19 NOTE — Progress Notes (Signed)
Patient here for oncology follow-up appointment, expresses no complaints or concerns at this time.    

## 2019-10-20 DIAGNOSIS — C786 Secondary malignant neoplasm of retroperitoneum and peritoneum: Secondary | ICD-10-CM | POA: Insufficient documentation

## 2019-10-20 DIAGNOSIS — Z7189 Other specified counseling: Secondary | ICD-10-CM | POA: Insufficient documentation

## 2019-10-20 NOTE — Progress Notes (Signed)
Hematology/Oncology Consult note [facility]  [phone] [phone]  Patient Care Team: [name], MD as PCP - General (Internal Medicine) [name], MD as Consulting Physician (General Surgery) Mellody Drown, MD as Referring Physician (Obstetrics and Gynecology) [name], MD as Referring Physician (Obstetrics and Gynecology) Hollice Espy, MD as Consulting Physician (Urology) [name], RN as Registered Nurse Sindy Guadeloupe, MD as Consulting Physician (Oncology)   Name of the patient: Jaime Hall  [id number]  1950-06-29   Date of visit: 10/20/19  Diagnosis-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  She underwent robotic hysterectomy with bilateral salpingo-oophorectomy with washings and sentinel lymph node injection mapping and biopsy in February 2018.  She had multiple postoperative complications and completed 3 cycles of carbotaxol chemotherapy in May 2018.  Treatment was complicated by chemo-induced peripheral neuropathy for which Taxol dose was reduced by 25%.  She completed 5 weeks of external beam whole pelvic radiation on August 2018.  Her original tumor in 2018 was ER +1%.  PR negative.  MSI stable and HER-2 negative  She was under clinical surveillance and recently presented to [name] with symptoms of right lower quadrant abdominal pain which led to a CT abdomen which showed new peritoneal metastatic disease throughout the right abdomen and pelvis with index masses measuring 5.7 x 4.7 cm.  Head CT scan also showed evidence of hypermetabolic abdominal pelvic peritoneal metastases but  no evidence of other sites of metastases.  Interval history-patient continues to be active and is working full-time as well.  She still continues to have peripheral neuropathy in her hands and feet which started after chemotherapy for lymphoma but did worsen with carbotaxol chemo in 2018.She is currently taking 30 mg of Cymbalta as she could not tolerate 60 mg dose.  Reports her abdominal pain comes and goes but for the most part has Improved significantly  ECOG PS- 1 Pain scale- 0   Review of systems- Review of Systems  Constitutional: Negative for chills, fever, malaise/fatigue and weight loss.  HENT: Negative for congestion, ear discharge and nosebleeds.   Eyes: Negative for blurred vision.  Respiratory: Negative for cough, hemoptysis, sputum production, shortness of breath and wheezing.   Cardiovascular: Negative for chest pain, palpitations, orthopnea and claudication.  Gastrointestinal: Negative for abdominal pain, blood in stool, constipation, diarrhea, heartburn, melena, nausea and vomiting.  Genitourinary: Negative for dysuria, flank pain, frequency, hematuria and urgency.  Musculoskeletal: Negative for back pain, joint pain and myalgias.  Skin: Negative for rash.  Neurological: Positive for sensory change (Peripheral neuropathy). Negative for dizziness, tingling, focal weakness, seizures, weakness and headaches.  Endo/Heme/Allergies: Does not bruise/bleed easily.  Psychiatric/Behavioral: Negative for depression and suicidal ideas. The patient does not have insomnia.       Allergies  Allergen Reactions  . Adhesive [Tape] Other (See Comments)    Burns the skin  . Z-Pak [Azithromycin] Itching  . Antifungal [Miconazole Nitrate] Rash  . Sulfa Antibiotics Nausea And Vomiting and Rash    N/T  . Tolnaftate Rash     Past Medical History:  Diagnosis Date  . Anxiety   . Cervicalgia   . Coronary artery disease    a. 02/2012 Stress echo:  severe anterior wall ischemia;  b. 02/2012  Cath/PCI: LAD 95p (3.0 x 15 Xience EX DES), D1 90ost (PTCA - bifurcational dzs), EF 45% with anterior HK;  b. 02/2013 Ex MV: fixed anterior defect w/ minor reversibility, nl EF-->Med Rx.  . Endometrial cancer ([place])    a. 07/2016 s/p robotic hysterectomy, BSO w/ washings, sentinel node inj, mapping, bx, adhesiolysis.  . Essential hypertension, benign   . Fibrocystic breast disease   . GERD (gastroesophageal reflux disease)   . Gestational hypertension   . Heart murmur   . History of anemia   . History of blood transfusion   . Hodgkin's lymphoma ([place]) 2011   a. s/p radiation and chemo therapy  . Osteoarthritis   . Polycystic ovarian disease      Past Surgical History:  Procedure Laterality Date  . ABDOMINAL HYSTERECTOMY    . bladder sling    . CARDIAC CATHETERIZATION  02/2012   [facility] 1 stent place  . CERVICAL POLYPECTOMY    . CHOLECYSTECTOMY  1982  . COLONOSCOPY WITH PROPOFOL N/A 02/05/2015   Procedure: COLONOSCOPY WITH PROPOFOL;  Surgeon: Lucilla Lame, MD;  Location: [facility];  Service: Endoscopy;  Laterality: N/A;  . CORONARY ANGIOPLASTY  02/2012   left/right s/p balloon  . CYSTOGRAM N/A 08/17/2016   Procedure: CYSTOGRAM;  Surgeon: Hollice Espy, MD;  Location: [facility];  Service: Urology;  Laterality: N/A;  . CYSTOSCOPY N/A 08/17/2016   Procedure: CYSTOSCOPY EXAM UNDER ANESTHESIA;  Surgeon: Hollice Espy, MD;  Location: [facility];  Service: Urology;  Laterality: N/A;  . CYSTOSCOPY W/ RETROGRADES Bilateral 08/17/2016   Procedure: CYSTOSCOPY WITH RETROGRADE PYELOGRAM;  Surgeon: Hollice Espy, MD;  Location: [facility];  Service: Urology;  Laterality: Bilateral;  . CYSTOSCOPY WITH STENT PLACEMENT Right 08/17/2016   Procedure: CYSTOSCOPY WITH STENT PLACEMENT;  Surgeon: Hollice Espy, MD;  Location: [facility];  Service: Urology;  Laterality: Right;  . heart stent'  2013  . kidney stent Right 2018  . LYMPH NODE BIOPSY  2011   diagnosis of hodgkins lymphoma  . PELVIC LYMPH NODE DISSECTION  N/A 07/29/2016   Procedure: PELVIC/AORTIC LYMPH NODE SAMPLING;  Surgeon: [name], MD;  Location: [facility] OR[facility];  Service: Gynecology;  Laterality: N/A;  . PORTA CATH INSERTION N/A 09/22/2016   Procedure: Glori Luis Cath Insertion;  Surgeon: [name], MD;  Location: [place] [facility];  Service: Cardiovascular;  Laterality: N/A;  . PORTA CATH REMOVAL N/A 11/17/2016   Procedure: Glori Luis Cath Removal;  Surgeon: [name], MD;  Location: [place] [facility];  Service: Cardiovascular;  Laterality: N/A;  . ROBOTIC ASSISTED TOTAL HYSTERECTOMY WITH BILATERAL SALPINGO OOPHERECTOMY N/A 07/29/2016   Procedure: ROBOTIC ASSISTED TOTAL HYSTERECTOMY WITH BILATERAL SALPINGO OOPHORECTOMY;  Surgeon: [name], MD;  Location: [facility];  Service: Gynecology;  Laterality: N/A;  . SENTINEL NODE BIOPSY N/A 07/29/2016   Procedure: SENTINEL NODE BIOPSY;  Surgeon: [name], MD;  Location: [facility];  Service: Gynecology;  Laterality: N/A;  . transobturator sling N/A 2009   [place]    Social History   Socioeconomic History  . Marital status: Widowed    Spouse name: Not on file  . Number of children: Not on file  . Years of education: Not on file  . Highest education level: Not on file  Occupational History  . Not on file  Tobacco Use  . Smoking status: Former Smoker    Packs/day: 1.00    Years: 30.00    Pack years: 30.00  Types: Cigarettes    Quit date: 07/24/2002    Years since quitting: 17.2  . Smokeless tobacco: Never Used  . Tobacco comment: quit smoking in 2000  Substance and Sexual Activity  . Alcohol use: Yes    Comment: rarely-liquor  . Drug use: No  . Sexual activity: Never  Other Topics Concern  . 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    Social Determinants of Health   Financial Resource Strain:   . Difficulty of Paying Living Expenses:   Food  Insecurity:   . Worried About Charity fundraiser in the Last Year:   . Arboriculturist in the Last Year:   Transportation Needs:   . Film/video editor (Medical):   Marland Kitchen Lack of Transportation (Non-Medical):   Physical Activity:   . Days of Exercise per Week:   . Minutes of Exercise per Session:   Stress:   . Feeling of Stress :   Social Connections:   . Frequency of Communication with Friends and Family:   . Frequency of Social Gatherings with Friends and Family:   . Attends Religious Services:   . Active Member of Clubs or Organizations:   . Attends Archivist Meetings:   Marland Kitchen Marital Status:   Intimate Partner Violence:   . Fear of Current or Ex-Partner:   . Emotionally Abused:   Marland Kitchen Physically Abused:   . Sexually Abused:     Family History  Problem Relation Age of Onset  . ALS Father   . Polymyositis Father   . Diabetes Brother   . Cancer Maternal Aunt        breast  . Breast cancer Maternal Aunt        30's  . Stroke Maternal Grandmother   . Cancer Maternal Grandfather        prostate  . Stroke Maternal Grandfather   . Colon cancer Maternal Aunt   . Non-Hodgkin's lymphoma Cousin      Current Outpatient Medications:  .  acetaminophen (TYLENOL) 500 MG tablet, Take 1,000 mg by mouth every 6 (six) hours as needed for mild pain. , Disp: , Rfl:  .  aspirin EC 81 MG tablet, Take 81 mg by mouth at bedtime., Disp: , Rfl:  .  atorvastatin (LIPITOR) 10 MG tablet, Take 1 tablet by mouth once daily, Disp: 90 tablet, Rfl: 2 .  Cholecalciferol (VITAMIN D3) 2000 units TABS, Take 2,000 Units by mouth 3 (three) times a week., Disp: , Rfl:  .  cyanocobalamin (,VITAMIN B-12,) 1000 MCG/ML injection, INJECT 1ML IM WEEKLY FOR 4 WEEKS, THEN INJECT MONTHLY THEREAFTER, Disp: 10 mL, Rfl: 0 .  DULoxetine (CYMBALTA) 30 MG capsule, Take 1 capsule by mouth once daily, Disp: 90 capsule, Rfl: 0 .  metoprolol tartrate (LOPRESSOR) 25 MG tablet, Take 1 tablet by mouth twice daily, Disp:  180 tablet, Rfl: 2 .  nystatin (MYCOSTATIN/NYSTOP) powder, Apply 1 application topically 2 (two) times daily. To rash until resolved., Disp: 30 g, Rfl: 0 .  Syringe/Needle, Disp, (SYRINGE 3CC/25GX1") 25G X 1" 3 ML MISC, Use for b12 injections, Disp: 50 each, Rfl: 0 .  Turmeric 500 MG CAPS, Take 500 mg by mouth daily. , Disp: , Rfl:  .  diazepam (VALIUM) 5 MG tablet, Take 1 tablet (5 mg total) by mouth every 12 (twelve) hours as needed for anxiety. (Patient not taking: Reported  on 10/19/2019), Disp: 30 tablet, Rfl: 0 .  nitroGLYCERIN (NITROSTAT) 0.4 MG SL tablet, Place 1 tablet (0.4 mg total) under the tongue every 5 (five) minutes as needed for chest pain. (Patient not taking: Reported on 10/19/2019), Disp: 25 tablet, Rfl: 3  Physical exam:  Vitals:   10/19/19 0850  BP: (!) 113/48  Pulse: 68  Resp: 16  Temp: (!) 95.1 F (35.1 C)  TempSrc: Tympanic  SpO2: 100%  Weight: 162 lb (73.5 kg)   Physical Exam Constitutional:      General: She is not in acute distress. Cardiovascular:     Rate and Rhythm: Normal rate and regular rhythm.     Heart sounds: Normal heart sounds.  Pulmonary:     Effort: Pulmonary effort is normal.     Breath sounds: Normal breath sounds.  Abdominal:     General: Bowel sounds are normal. There is no distension.     Palpations: Abdomen is soft.  Skin:    General: Skin is warm and dry.  Neurological:     Mental Status: She is alert and oriented to person, place, and time.      CMP Latest Ref Rng & Units 10/02/2019  Glucose 70 - 99 mg/dL 80  BUN 6 - 23 mg/dL 31(H)  Creatinine 0.40 - 1.20 mg/dL 1.33(H)  Sodium 135 - 145 mEq/L 140  Potassium 3.5 - 5.1 mEq/L 4.3  Chloride 96 - 112 mEq/L 105  CO2 19 - 32 mEq/L 28  Calcium 8.4 - 10.5 mg/dL 9.5  Total Protein 6.0 - 8.3 g/dL 6.5  Total Bilirubin 0.2 - 1.2 mg/dL 0.6  Alkaline Phos 39 - 117 U/L 87  AST 0 - 37 U/L 19  ALT 0 - 35 U/L 10   CBC Latest Ref Rng & Units 10/02/2019  WBC 4.0 - 10.5 K/uL 10.3  Hemoglobin  12.0 - 15.0 g/dL 12.1  Hematocrit 36.0 - 46.0 % 36.3  Platelets 150.0 - 400.0 K/uL 244.0    No images are attached to the encounter.  CT Abdomen Pelvis W Contrast  Result Date: 10/16/2019 CLINICAL DATA:  Right lower quadrant pain and paraumbilical pain for 2 weeks. Personal history of endometrial carcinoma and Hodgkin's lymphoma. EXAM: CT ABDOMEN AND PELVIS WITH CONTRAST TECHNIQUE: Multidetector CT imaging of the abdomen and pelvis was performed using the standard protocol following bolus administration of intravenous contrast. CONTRAST:  57m OMNIPAQUE IOHEXOL 300 MG/ML  SOLN COMPARISON:  08/30/2017 FINDINGS: Lower Chest: No acute findings. Hepatobiliary: Multiple small hypovascular masses are seen along the capsular surface of the liver, mostly along the posterior right hepatic lobe. Largest index lesion measures 2.7 x 1.4 cm on image 20/2. No definite intrahepatic masses identified. Prior cholecystectomy. No evidence of biliary obstruction. Pancreas:  No mass or inflammatory changes. Spleen: Within normal limits in size and appearance. Adrenals/Urinary Tract: No masses identified. Several small right renal cyst noted. No evidence of ureteral calculi or hydronephrosis. Stomach/Bowel: No evidence of obstruction, inflammatory process or abnormal fluid collections. Diverticulosis is seen mainly involving the sigmoid colon, however there is no evidence of diverticulitis. Vascular/Lymphatic: No pathologically enlarged lymph nodes. No abdominal aortic aneurysm. Aortic atherosclerosis incidentally noted. Reproductive: Prior hysterectomy. Mild peritoneal soft tissue thickening seen in the right adnexa. Other: Multiple peritoneal soft tissue masses and nodules are seen, mainly in the right abdomen and pelvis. These are new since previous study. Largest index mass is located in the anterior right abdomen just deep to the abdominal wall muscles, measuring 5.7 x 4.7 cm on  image 51/2. Another index mass in the right  lower quadrant mesentery measures 5.0 x 3.5 cm Musculoskeletal: No suspicious bone lesions identified. Old bilateral sacral insufficiency fractures again noted. IMPRESSION: New peritoneal metastatic disease throughout the right abdomen and pelvis, as described above. These results will be called to the ordering clinician or representative by the Radiologist Assistant, and communication documented in the PACS or [employer]. Electronically Signed   By: Marlaine Hind M.D.   On: 10/16/2019 11:47   NM PET Image Restag (PS) Skull Base To Thigh  Result Date: 10/18/2019 CLINICAL DATA:  Subsequent treatment strategy for endometrial cancer with peritoneal metastasis. Left upper extremity COVID-19 vaccine 09/12/2019. Prior history of Hodgkin's lymphoma. EXAM: NUCLEAR MEDICINE PET SKULL BASE TO THIGH TECHNIQUE: 8.4 mCi F-18 FDG was injected intravenously. Full-ring PET imaging was performed from the skull base to thigh after the radiotracer. CT data was obtained and used for attenuation correction and anatomic localization. Fasting blood glucose: 65 mg/dl COMPARISON:  10/16/2019 abdominopelvic CT. Most recent PET 10/06/2017. FINDINGS: Mediastinal blood pool activity: SUV max 2.9 Liver activity: SUV max NA NECK: No areas of abnormal hypermetabolism. Incidental CT findings: Left carotid atherosclerosis. No cervical adenopathy. CHEST: Low-level hypermetabolism corresponding to chronic paramediastinal radiation induced fibrosis. No focal pulmonary parenchymal or thoracic nodal hypermetabolism. Incidental CT findings: Aortic atherosclerosis. Proximal LAD stent. 4 mm right middle lobe pulmonary nodule on 90/3 is similar on the 2019 PET and can be presumed benign. ABDOMEN/PELVIS: Extensive peritoneal disease along the hepatic capsule. Lesion along the falciform ligament measures 1.4 cm and a S.U.V. max of 20.9 on 122/3. Posterior right hepatic lobe capsular disease measures a S.U.V. max of 14.1 on 122/3. Right abdominopelvic  peritoneal and omental metastasis, as on CT. Index upper pelvic implant measures 5.4 x 4.5 cm and a S.U.V. max of 19.3 on 171/3. A focus of hypermetabolism along the vaginal cuff is slightly eccentric left, likely corresponding to subtle hyperenhancement on image 75/2 of the 10/16/2019 CT. Example at a S.U.V. max of 12.6 on approximately image 214/3 today. Incidental CT findings: Deferred to CT of 2 days prior. SKELETON: Mildly heterogeneous marrow activity within the thoracic spine, most likely related to prior radiation. No convincing evidence of hypermetabolic osseous metastasis. Incidental CT findings: Sacral insufficiency fractures are chronic. IMPRESSION: 1. Hypermetabolic abdominopelvic peritoneal metastasis, as detailed above. 2. No evidence of hypermetabolic supradiaphragmatic disease. Electronically Signed   By: Abigail Miyamoto M.D.   On: 10/18/2019 16:04     Assessment and plan- Patient is a 69 y.o. female with serous endometrial cancer stage I in 2018 s/p surgery followed by 3 cycles of adjuvant carbotaxol chemotherapy and whole pelvic radiation now with peritoneal carcinomatosis  1.  I reviewed PET/CT scan images independently and discussed findings with the patient.  She has evidence of peritoneal metastases from her prior endometrial primary.  PET scan shows no other evidence of distant metastatic disease.  Since it has been 3 years since her diagnosis I will repeat a biopsy and also send tissue for NGS testing.  Her baseline Ca1 25 is not elevated  2.  Discussed that she has had recurrence of cancer 3 years since her prior chemotherapy.  She therefore has platinum sensitive disease.  I would therefore favor using carboplatin based treatment at this time over lenvatinib Keytruda combination.  3.  Patient did have significant Taxol-induced neuropathy warranting dose reduction despite only giving her 3 cycles of treatment.  At this time patient has stage IV disease and chemotherapy is  typically  given until progression or toxicity.  I am concerned she may not be able to tolerate Taxol for a prolonged period of time without significant worsening of her neuropathy and quality of life.  I would therefore like to choose a non-Taxol regimen for her while retaining carboplatin.  I therefore recommend weekly carboplatin AUC 2 along with gemcitabine 800 mg per metered square day 1 and day 8 every 3 weeks.  At her age I am concerned about cytopenias I therefore wish to avoid day 1 day 8 and day 15 treatment.  If her counts hold up I will consider giving her 3 weeks on 1 week of treatment.  Discussed risks and benefits of chemotherapy including all but not limited to fatigue, nausea, vomiting, low blood counts, risk of infections and hospitalization.  There is some chance of neuropathy associated with carboplatin as well.  Treatment will be given with a palliative intent.  She will need port placement again at this time.  4.  Patient will be seeing Dr. Fransisca Connors next week and get his opinion as well.  I will discussed this with Dr. Fransisca Connors as well and come up with a treatment plan for her based on that.  5.  Patient does not have significant abdominal pain at this time and I will therefore hold off on starting any opioids at this time   Visit Diagnosis 1. Endometrial cancer ([place])   2. Peritoneal carcinomatosis ([place])   3. Chemotherapy-induced peripheral neuropathy ([place])   4. Goals of care, counseling/discussion      Dr. Randa Evens, MD, MPH [facility] [phone] 10/20/2019 12:41 PM

## 2019-10-20 NOTE — Progress Notes (Signed)
Patient on schedule for Peritoneal biopsy 10/24/2019, spoke with patient with pre procedure instructions given, made aware to be here @ 0930, NPO after 0200 as well as have driver post procedure after discharge. Stated understanding.

## 2019-10-22 ENCOUNTER — Other Ambulatory Visit: Payer: Self-pay | Admitting: Cardiovascular Disease

## 2019-10-22 ENCOUNTER — Other Ambulatory Visit: Payer: Self-pay | Admitting: Oncology

## 2019-10-23 ENCOUNTER — Telehealth (INDEPENDENT_AMBULATORY_CARE_PROVIDER_SITE_OTHER): Payer: Self-pay

## 2019-10-23 ENCOUNTER — Other Ambulatory Visit: Payer: Self-pay | Admitting: Radiology

## 2019-10-23 NOTE — Telephone Encounter (Signed)
Spoke with the patient and she is now scheduled with Dr. Delana Meyer for a porta cath insertion on 10/27/19 with a 6:45 am arrival to the [facility]. Patient will do covid testing on 10/25/19 between 8-1 pm at the [place]. Pre-procedure instructions were discussed and will be mailed.

## 2019-10-24 ENCOUNTER — Ambulatory Visit
Admission: RE | Admit: 2019-10-24 | Discharge: 2019-10-24 | Disposition: A | Discharge status: Home / Self Care | Payer: PPO | Source: Ambulatory Visit | Attending: Oncology | Admitting: Oncology

## 2019-10-24 ENCOUNTER — Other Ambulatory Visit: Payer: Self-pay

## 2019-10-24 ENCOUNTER — Telehealth: Payer: Self-pay

## 2019-10-24 DIAGNOSIS — I2510 Atherosclerotic heart disease of native coronary artery without angina pectoris: Secondary | ICD-10-CM | POA: Diagnosis not present

## 2019-10-24 DIAGNOSIS — Z8042 Family history of malignant neoplasm of prostate: Secondary | ICD-10-CM | POA: Diagnosis not present

## 2019-10-24 DIAGNOSIS — Z820 Family history of epilepsy and other diseases of the nervous system: Secondary | ICD-10-CM | POA: Diagnosis not present

## 2019-10-24 DIAGNOSIS — F419 Anxiety disorder, unspecified: Secondary | ICD-10-CM | POA: Insufficient documentation

## 2019-10-24 DIAGNOSIS — Z923 Personal history of irradiation: Secondary | ICD-10-CM | POA: Diagnosis not present

## 2019-10-24 DIAGNOSIS — Z8542 Personal history of malignant neoplasm of other parts of uterus: Secondary | ICD-10-CM | POA: Diagnosis not present

## 2019-10-24 DIAGNOSIS — K668 Other specified disorders of peritoneum: Secondary | ICD-10-CM | POA: Diagnosis not present

## 2019-10-24 DIAGNOSIS — Z9221 Personal history of antineoplastic chemotherapy: Secondary | ICD-10-CM | POA: Insufficient documentation

## 2019-10-24 DIAGNOSIS — Z955 Presence of coronary angioplasty implant and graft: Secondary | ICD-10-CM | POA: Insufficient documentation

## 2019-10-24 DIAGNOSIS — R935 Abnormal findings on diagnostic imaging of other abdominal regions, including retroperitoneum: Secondary | ICD-10-CM | POA: Diagnosis not present

## 2019-10-24 DIAGNOSIS — Z881 Allergy status to other antibiotic agents status: Secondary | ICD-10-CM | POA: Diagnosis not present

## 2019-10-24 DIAGNOSIS — Z79899 Other long term (current) drug therapy: Secondary | ICD-10-CM | POA: Diagnosis not present

## 2019-10-24 DIAGNOSIS — K219 Gastro-esophageal reflux disease without esophagitis: Secondary | ICD-10-CM | POA: Insufficient documentation

## 2019-10-24 DIAGNOSIS — Z87891 Personal history of nicotine dependence: Secondary | ICD-10-CM | POA: Insufficient documentation

## 2019-10-24 DIAGNOSIS — Z888 Allergy status to other drugs, medicaments and biological substances status: Secondary | ICD-10-CM | POA: Diagnosis not present

## 2019-10-24 DIAGNOSIS — Z8571 Personal history of Hodgkin lymphoma: Secondary | ICD-10-CM | POA: Insufficient documentation

## 2019-10-24 DIAGNOSIS — Z7982 Long term (current) use of aspirin: Secondary | ICD-10-CM | POA: Insufficient documentation

## 2019-10-24 DIAGNOSIS — Z803 Family history of malignant neoplasm of breast: Secondary | ICD-10-CM | POA: Insufficient documentation

## 2019-10-24 DIAGNOSIS — Z882 Allergy status to sulfonamides status: Secondary | ICD-10-CM | POA: Diagnosis not present

## 2019-10-24 DIAGNOSIS — Z807 Family history of other malignant neoplasms of lymphoid, hematopoietic and related tissues: Secondary | ICD-10-CM | POA: Insufficient documentation

## 2019-10-24 DIAGNOSIS — Z800 Family history of malignant neoplasm of digestive organs: Secondary | ICD-10-CM | POA: Insufficient documentation

## 2019-10-24 DIAGNOSIS — C541 Malignant neoplasm of endometrium: Secondary | ICD-10-CM | POA: Diagnosis not present

## 2019-10-24 DIAGNOSIS — C786 Secondary malignant neoplasm of retroperitoneum and peritoneum: Secondary | ICD-10-CM | POA: Diagnosis not present

## 2019-10-24 DIAGNOSIS — Z833 Family history of diabetes mellitus: Secondary | ICD-10-CM | POA: Diagnosis not present

## 2019-10-24 LAB — CBC WITH DIFFERENTIAL/PLATELET
Abs Immature Granulocytes: 0.05 10*3/uL (ref 0.00–0.07)
Basophils Absolute: 0.1 10*3/uL (ref 0.0–0.1)
Basophils Relative: 1 %
Eosinophils Absolute: 0.1 10*3/uL (ref 0.0–0.5)
Eosinophils Relative: 1 %
HCT: 37 % (ref 36.0–46.0)
Hemoglobin: 12.1 g/dL (ref 12.0–15.0)
Immature Granulocytes: 0 %
Lymphocytes Relative: 7 %
Lymphs Abs: 0.8 10*3/uL (ref 0.7–4.0)
MCH: 28.5 pg (ref 26.0–34.0)
MCHC: 32.7 g/dL (ref 30.0–36.0)
MCV: 87.3 fL (ref 80.0–100.0)
Monocytes Absolute: 0.5 10*3/uL (ref 0.1–1.0)
Monocytes Relative: 5 %
Neutro Abs: 10 10*3/uL — ABNORMAL HIGH (ref 1.7–7.7)
Neutrophils Relative %: 86 %
Platelets: 241 10*3/uL (ref 150–400)
RBC: 4.24 MIL/uL (ref 3.87–5.11)
RDW: 13.6 % (ref 11.5–15.5)
WBC: 11.5 10*3/uL — ABNORMAL HIGH (ref 4.0–10.5)
nRBC: 0 % (ref 0.0–0.2)

## 2019-10-24 LAB — BASIC METABOLIC PANEL
Anion gap: 8 (ref 5–15)
BUN: 33 mg/dL — ABNORMAL HIGH (ref 8–23)
CO2: 25 mmol/L (ref 22–32)
Calcium: 9.1 mg/dL (ref 8.9–10.3)
Chloride: 106 mmol/L (ref 98–111)
Creatinine, Ser: 1.36 mg/dL — ABNORMAL HIGH (ref 0.44–1.00)
GFR calc Af Amer: 46 mL/min — ABNORMAL LOW (ref >60)
GFR calc non Af Amer: 39 mL/min — ABNORMAL LOW (ref >60)
Glucose, Bld: 94 mg/dL (ref 70–99)
Potassium: 5.1 mmol/L (ref 3.5–5.1)
Sodium: 139 mmol/L (ref 135–145)

## 2019-10-24 LAB — PROTIME-INR
INR: 1 (ref 0.8–1.2)
Prothrombin Time: 12.6 seconds (ref 11.4–15.2)

## 2019-10-24 MED ORDER — SODIUM CHLORIDE 0.9 % IV SOLN: INTRAVENOUS | Status: DC

## 2019-10-24 MED ORDER — MIDAZOLAM HCL 5 MG/5ML IJ SOLN
INTRAMUSCULAR | Status: AC
  Filled 2019-10-24: qty 5

## 2019-10-24 MED ORDER — MIDAZOLAM HCL 2 MG/2ML IJ SOLN
INTRAMUSCULAR | Status: AC | PRN
  Administered 2019-10-24: 1 mg via INTRAVENOUS

## 2019-10-24 MED ORDER — FENTANYL CITRATE (PF) 100 MCG/2ML IJ SOLN
INTRAMUSCULAR | Status: AC
  Filled 2019-10-24: qty 2

## 2019-10-24 MED ORDER — FENTANYL CITRATE (PF) 100 MCG/2ML IJ SOLN
INTRAMUSCULAR | Status: AC | PRN
  Administered 2019-10-24: 50 ug via INTRAVENOUS

## 2019-10-24 NOTE — Progress Notes (Signed)
Patient clinically stable post Peritoneal biopsy per Dr Kathlene Cote, tolerated well with bandade dressing dry and intact. Denies complaints at this time. Received Versed 1mg  along with Fentanyl 93mcg IV per procedure. Report given to [facility] with questions answered.

## 2019-10-24 NOTE — Telephone Encounter (Signed)
Requisition form for [facility] completed and copy faxed to pathology. Request made for specimen collected, 10/24/19, right peritoneal mass. Accession [id number].

## 2019-10-24 NOTE — Consult Note (Signed)
Chief Complaint: Patient was seen in consultation today for image guided biopsy of peritoneal mass  Referring Physician(s): [facility]  Supervising Physician: Aletta Edouard  Patient Status: [facility] - Out-pt  History of Present Illness: Jaime Hall is a 69 y.o. female with history of endometrial carcinoma 2018, status post surgery and chemoradiation as well as Hodgkin's lymphoma diagnosed in 2011 with prior radiation and chemotherapy.  Recent PET scan has revealed hypermetabolic abdominopelvic peritoneal metastases and she presents today for image guided biopsy peritoneal mass biopsy  for further evaluation.  Additional medical history as below.  Past Medical History:  Diagnosis Date  . Anxiety   . Cervicalgia   . Coronary artery disease    a. 02/2012 Stress echo: severe anterior wall ischemia;  b. 02/2012 Cath/PCI: LAD 95p (3.0 x 15 Xience EX DES), D1 90ost (PTCA - bifurcational dzs), EF 45% with anterior HK;  b. 02/2013 Ex MV: fixed anterior defect w/ minor reversibility, nl EF-->Med Rx.  . Endometrial cancer ([place])    a. 07/2016 s/p robotic hysterectomy, BSO w/ washings, sentinel node inj, mapping, bx, adhesiolysis.  . Essential hypertension, benign   . Fibrocystic breast disease   . GERD (gastroesophageal reflux disease)   . Gestational hypertension   . Heart murmur   . History of anemia   . History of blood transfusion   . Hodgkin's lymphoma ([place]) 2011   a. s/p radiation and chemo therapy  . Osteoarthritis   . Polycystic ovarian disease     Past Surgical History:  Procedure Laterality Date  . ABDOMINAL HYSTERECTOMY    . bladder sling    . CARDIAC CATHETERIZATION  02/2012   [facility] 1 stent place  . CERVICAL POLYPECTOMY    . CHOLECYSTECTOMY  1982  . COLONOSCOPY WITH PROPOFOL N/A 02/05/2015   Procedure: COLONOSCOPY WITH PROPOFOL;  Surgeon: Lucilla Lame, MD;  Location: [facility] ENDOSCOPY;  Service: Endoscopy;  Laterality: N/A;  . CORONARY ANGIOPLASTY  02/2012   left/right  s/p balloon  . CYSTOGRAM N/A 08/17/2016   Procedure: CYSTOGRAM;  Surgeon: Hollice Espy, MD;  Location: [facility] ORS;  Service: Urology;  Laterality: N/A;  . CYSTOSCOPY N/A 08/17/2016   Procedure: CYSTOSCOPY EXAM UNDER ANESTHESIA;  Surgeon: Hollice Espy, MD;  Location: [facility] ORS;  Service: Urology;  Laterality: N/A;  . CYSTOSCOPY W/ RETROGRADES Bilateral 08/17/2016   Procedure: CYSTOSCOPY WITH RETROGRADE PYELOGRAM;  Surgeon: Hollice Espy, MD;  Location: [facility] ORS;  Service: Urology;  Laterality: Bilateral;  . CYSTOSCOPY WITH STENT PLACEMENT Right 08/17/2016   Procedure: CYSTOSCOPY WITH STENT PLACEMENT;  Surgeon: Hollice Espy, MD;  Location: [facility] ORS;  Service: Urology;  Laterality: Right;  . heart stent'  2013  . kidney stent Right 2018  . LYMPH NODE BIOPSY  2011   diagnosis of hodgkins lymphoma  . PELVIC LYMPH NODE DISSECTION N/A 07/29/2016   Procedure: PELVIC/AORTIC LYMPH NODE SAMPLING;  Surgeon: [name], MD;  Location: [facility] ORS;  Service: Gynecology;  Laterality: N/A;  . PORTA CATH INSERTION N/A 09/22/2016   Procedure: Glori Luis Cath Insertion;  Surgeon: [name], MD;  Location: [place] [facility];  Service: Cardiovascular;  Laterality: N/A;  . PORTA CATH REMOVAL N/A 11/17/2016   Procedure: Glori Luis Cath Removal;  Surgeon: [name], MD;  Location: [place] [facility];  Service: Cardiovascular;  Laterality: N/A;  . ROBOTIC ASSISTED TOTAL HYSTERECTOMY WITH BILATERAL SALPINGO OOPHERECTOMY N/A 07/29/2016   Procedure: ROBOTIC ASSISTED TOTAL HYSTERECTOMY WITH BILATERAL SALPINGO OOPHORECTOMY;  Surgeon: [name], MD;  Location:  [facility] ORS;  Service: Gynecology;  Laterality: N/A;  . SENTINEL NODE BIOPSY N/A 07/29/2016   Procedure: SENTINEL NODE BIOPSY;  Surgeon: [name], MD;  Location: [facility] ORS;  Service: Gynecology;  Laterality: N/A;  . transobturator sling N/A 2009   [place]    Allergies: Adhesive [tape], Z-pak [azithromycin], Antifungal  [miconazole nitrate], Sulfa antibiotics, and Tolnaftate  Medications: Prior to Admission medications   Medication Sig Start Date End Date Taking? Authorizing Provider  aspirin EC 81 MG tablet Take 81 mg by mouth at bedtime.    [provider]  atorvastatin (LIPITOR) 10 MG tablet Take 1 tablet by mouth once daily Patient taking differently: Take 10 mg by mouth every evening.  10/23/19   Wellington Hampshire, MD  cholecalciferol (VITAMIN D3) 25 MCG (1000 UNIT) tablet Take 1,000 Units by mouth in the morning and at bedtime.    [provider]  cyanocobalamin (,VITAMIN B-12,) 1000 MCG/ML injection INJECT 1ML IM WEEKLY FOR 4 WEEKS, THEN INJECT MONTHLY THEREAFTER Patient taking differently: Inject 1,000 mcg into the skin every 30 (thirty) days.  09/05/19   [name], MD  diazepam (VALIUM) 5 MG tablet Take 1 tablet (5 mg total) by mouth every 12 (twelve) hours as needed for anxiety. Patient taking differently: Take 2.5 mg by mouth every 12 (twelve) hours as needed for anxiety.  10/15/17   [name], MD  DM-APAP-CPM (CORICIDIN HBP PO) Take 1 tablet by mouth daily as needed (allergies).    [provider]  DULoxetine (CYMBALTA) 30 MG capsule Take 1 capsule by mouth once daily Patient taking differently: Take 30 mg by mouth daily.  10/22/19   Sindy Guadeloupe, MD  esomeprazole (NEXIUM) 20 MG capsule Take 20 mg by mouth every other day.    [provider]  metoprolol tartrate (LOPRESSOR) 25 MG tablet Take 1 tablet by mouth twice daily Patient taking differently: Take 25 mg by mouth 2 (two) times daily.  08/04/19   Wellington Hampshire, MD  nitroGLYCERIN (NITROSTAT) 0.4 MG SL tablet Place 1 tablet (0.4 mg total) under the tongue every 5 (five) minutes as needed for chest pain. 02/22/13   Minna Merritts, MD  nystatin (MYCOSTATIN/NYSTOP) powder Apply 1 application topically 2 (two) times daily. To rash until resolved. Patient not taking: Reported on 10/23/2019 10/02/19    [name], MD  Syringe/Needle, Disp, (SYRINGE 3CC/25GX1") 25G X 1" 3 ML MISC Use for b12 injections 03/08/19   [name], MD  Turmeric 500 MG CAPS Take 500 mg by mouth daily.     [provider]     Family History  Problem Relation Age of Onset  . ALS Father   . Polymyositis Father   . Diabetes Brother   . Cancer Maternal Aunt        breast  . Breast cancer Maternal Aunt        30's  . Stroke Maternal Grandmother   . Cancer Maternal Grandfather        prostate  . Stroke Maternal Grandfather   . Colon cancer Maternal Aunt   . Non-Hodgkin's lymphoma Cousin     Social History   Socioeconomic History  . Marital status: Widowed    Spouse name: Not on file  . Number of children: Not on file  . Years of education: Not on file  . Highest education level: Not on file  Occupational History  . Not on file  Tobacco Use  . Smoking status: Former  Smoker    Packs/day: 1.00    Years: 30.00    Pack years: 30.00    Types: Cigarettes    Quit date: 07/24/2002    Years since quitting: 17.2  . Smokeless tobacco: Never Used  . Tobacco comment: quit smoking in 2000  Substance and Sexual Activity  . Alcohol use: Yes    Comment: rarely-liquor  . Drug use: No  . Sexual activity: Never  Other Topics Concern  . 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    Social Determinants of Health   Financial Resource Strain:   . Difficulty of Paying Living Expenses:   Food Insecurity:   . Worried About Charity fundraiser in the Last Year:   . Arboriculturist in the Last Year:   Transportation Needs:   . Film/video editor (Medical):   Marland Kitchen Lack of Transportation (Non-Medical):   Physical Activity:   . Days of Exercise per Week:   . Minutes of Exercise per Session:   Stress:   . Feeling of Stress :   Social Connections:   . Frequency of Communication with Friends and Family:   .  Frequency of Social Gatherings with Friends and Family:   . Attends Religious Services:   . Active Member of Clubs or Organizations:   . Attends Archivist Meetings:   Marland Kitchen Marital Status:       Review of Systems denies fever, headache, chest pain, dyspnea, cough, nausea, vomiting or bleeding.  She does have some intermittent abdominal/pelvic and back discomfort  Vital Signs: Vitals:   10/24/19 1009  BP: (!) 117/54  Pulse: 72  Resp: 19  Temp: 97.8 F (36.6 C)  SpO2: 99%      Physical Exam awake, alert.  Chest clear to auscultation bilaterally.  Heart with regular rate and rhythm.  Abdomen soft, positive bowel sounds, currently nontender.  Extremities with full range of motion.  Imaging: CT Abdomen Pelvis W Contrast  Result Date: 10/16/2019 CLINICAL DATA:  Right lower quadrant pain and paraumbilical pain for 2 weeks. Personal history of endometrial carcinoma and Hodgkin's lymphoma. EXAM: CT ABDOMEN AND PELVIS WITH CONTRAST TECHNIQUE: Multidetector CT imaging of the abdomen and pelvis was performed using the standard protocol following bolus administration of intravenous contrast. CONTRAST:  70mL OMNIPAQUE IOHEXOL 300 MG/ML  SOLN COMPARISON:  08/30/2017 FINDINGS: Lower Chest: No acute findings. Hepatobiliary: Multiple small hypovascular masses are seen along the capsular surface of the liver, mostly along the posterior right hepatic lobe. Largest index lesion measures 2.7 x 1.4 cm on image 20/2. No definite intrahepatic masses identified. Prior cholecystectomy. No evidence of biliary obstruction. Pancreas:  No mass or inflammatory changes. Spleen: Within normal limits in size and appearance. Adrenals/Urinary Tract: No masses identified. Several small right renal cyst noted. No evidence of ureteral calculi or hydronephrosis. Stomach/Bowel: No evidence of obstruction, inflammatory process or abnormal fluid collections. Diverticulosis is seen mainly involving the sigmoid colon, however  there is no evidence of diverticulitis. Vascular/Lymphatic: No pathologically enlarged lymph nodes. No abdominal aortic aneurysm. Aortic atherosclerosis incidentally noted. Reproductive: Prior hysterectomy. Mild peritoneal soft tissue thickening seen in the right adnexa. Other: Multiple peritoneal soft tissue masses and nodules are seen, mainly in the right abdomen and pelvis. These are new since previous study. Largest index mass is located in the anterior right abdomen just deep to the abdominal wall muscles, measuring  5.7 x 4.7 cm on image 51/2. Another index mass in the right lower quadrant mesentery measures 5.0 x 3.5 cm Musculoskeletal: No suspicious bone lesions identified. Old bilateral sacral insufficiency fractures again noted. IMPRESSION: New peritoneal metastatic disease throughout the right abdomen and pelvis, as described above. These results will be called to the ordering clinician or representative by the Radiologist Assistant, and communication documented in the PACS or [employer]. Electronically Signed   By: Marlaine Hind M.D.   On: 10/16/2019 11:47   NM PET Image Restag (PS) Skull Base To Thigh  Result Date: 10/18/2019 CLINICAL DATA:  Subsequent treatment strategy for endometrial cancer with peritoneal metastasis. Left upper extremity COVID-19 vaccine 09/12/2019. Prior history of Hodgkin's lymphoma. EXAM: NUCLEAR MEDICINE PET SKULL BASE TO THIGH TECHNIQUE: 8.4 mCi F-18 FDG was injected intravenously. Full-ring PET imaging was performed from the skull base to thigh after the radiotracer. CT data was obtained and used for attenuation correction and anatomic localization. Fasting blood glucose: 65 mg/dl COMPARISON:  10/16/2019 abdominopelvic CT. Most recent PET 10/06/2017. FINDINGS: Mediastinal blood pool activity: SUV max 2.9 Liver activity: SUV max NA NECK: No areas of abnormal hypermetabolism. Incidental CT findings: Left carotid atherosclerosis. No cervical adenopathy. CHEST: Low-level  hypermetabolism corresponding to chronic paramediastinal radiation induced fibrosis. No focal pulmonary parenchymal or thoracic nodal hypermetabolism. Incidental CT findings: Aortic atherosclerosis. Proximal LAD stent. 4 mm right middle lobe pulmonary nodule on 90/3 is similar on the 2019 PET and can be presumed benign. ABDOMEN/PELVIS: Extensive peritoneal disease along the hepatic capsule. Lesion along the falciform ligament measures 1.4 cm and a S.U.V. max of 20.9 on 122/3. Posterior right hepatic lobe capsular disease measures a S.U.V. max of 14.1 on 122/3. Right abdominopelvic peritoneal and omental metastasis, as on CT. Index upper pelvic implant measures 5.4 x 4.5 cm and a S.U.V. max of 19.3 on 171/3. A focus of hypermetabolism along the vaginal cuff is slightly eccentric left, likely corresponding to subtle hyperenhancement on image 75/2 of the 10/16/2019 CT. Example at a S.U.V. max of 12.6 on approximately image 214/3 today. Incidental CT findings: Deferred to CT of 2 days prior. SKELETON: Mildly heterogeneous marrow activity within the thoracic spine, most likely related to prior radiation. No convincing evidence of hypermetabolic osseous metastasis. Incidental CT findings: Sacral insufficiency fractures are chronic. IMPRESSION: 1. Hypermetabolic abdominopelvic peritoneal metastasis, as detailed above. 2. No evidence of hypermetabolic supradiaphragmatic disease. Electronically Signed   By: Abigail Miyamoto M.D.   On: 10/18/2019 16:04    Labs:  CBC: Recent Labs    12/27/18 1423 02/15/19 0000 07/27/19 1429 10/02/19 1650  WBC 4.6 4.4 6.9 10.3  HGB 12.2 12.4 12.2 12.1  HCT 37.4 38 37.6 36.3  PLT 195 197 216.0 244.0    COAGS: No results for input(s): INR, APTT in the last 8760 hours.  BMP: Recent Labs    12/27/18 1423 02/15/19 0000 07/27/19 1429 10/02/19 1650  NA 140 140 143 140  K 4.6 4.2 4.3 4.3  CL 107  --  106 105  CO2 25  --  29 28  GLUCOSE 85  --  79 80  BUN 22 19 21  31*    CALCIUM 9.2  --  9.7 9.5  CREATININE 1.11* 1.2* 1.13 1.33*  GFRNONAA 51*  --   --   --   GFRAA 59*  --   --   --     LIVER FUNCTION TESTS: Recent Labs    12/27/18 1423 07/27/19 1429 10/02/19 1650  BILITOT 1.5*  1.1 0.6  AST 22 19 19   ALT 16 15 10   ALKPHOS 61 66 87  PROT 7.0 6.4 6.5  ALBUMIN 4.0 3.9 4.0    TUMOR MARKERS: No results for input(s): AFPTM, CEA, CA199, CHROMGRNA in the last 8760 hours.  Assessment and Plan: 69 y.o. female with history of endometrial carcinoma 2018, status post surgery and chemoradiation as well as Hodgkin's lymphoma diagnosed in 2011 with prior radiation and chemotherapy.  Recent PET scan has revealed hypermetabolic abdominopelvic peritoneal metastases and she presents today for image guided biopsy peritoneal mass biopsy  for further evaluation.Risks and benefits of procedure was discussed with the patient  including, but not limited to bleeding, infection, damage to adjacent structures or low yield requiring additional tests.  All of the questions were answered and there is agreement to proceed.  Consent signed and in chart.  LABS PENDING   Thank you for this interesting consult.  I greatly enjoyed meeting Jaime Hall and look forward to participating in their care.  A copy of this report was sent to the requesting provider on this date.  Electronically Signed: D. Rowe Robert, PA-C 10/24/2019, 9:41 AM   I spent a total of  25 minutes   in face to face in clinical consultation, greater than 50% of which was counseling/coordinating care for image guided peritoneal mass biopsy

## 2019-10-24 NOTE — Discharge Instructions (Signed)
Needle Biopsy, Care After This sheet gives you information about how to care for yourself after your procedure. Your health care provider may also give you more specific instructions. If you have problems or questions, contact your health care provider. What can I expect after the procedure? After the procedure, it is common to have soreness, bruising, or mild pain at the puncture site. This should go away in a few days. Follow these instructions at home: Needle insertion site care   Wash your hands with soap and water before you change your bandage (dressing). If you cannot use soap and water, use hand sanitizer.  Follow instructions from your health care provider about how to take care of your puncture site. This includes: ? When and how to change your dressing. ? When to remove your dressing.  Check your puncture site every day for signs of infection. Check for: ? Redness, swelling, or pain. ? Fluid or blood. ? Pus or a bad smell. ? Warmth. General instructions  Return to your normal activities as told by your health care provider. Ask your health care provider what activities are safe for you.  Do not take baths, swim, or use a hot tub until your health care provider approves. Ask your health care provider if you may take showers. You may only be allowed to take sponge baths.  Take over-the-counter and prescription medicines only as told by your health care provider.  Keep all follow-up visits as told by your health care provider. This is important. Contact a health care provider if:  You have a fever.  You have redness, swelling, or pain at the puncture site that lasts longer than a few days.  You have fluid, blood, or pus coming from your puncture site.  Your puncture site feels warm to the touch. Get help right away if:  You have severe bleeding from the puncture site. Summary  After the procedure, it is common to have soreness, bruising, or mild pain at the puncture  site. This should go away in a few days.  Check your puncture site every day for signs of infection, such as redness, swelling, or pain.  Get help right away if you have severe bleeding from your puncture site. This information is not intended to replace advice given to you by your health care provider. Make sure you discuss any questions you have with your health care provider. Document Revised: 08/13/2017 Document Reviewed: 06/14/2017 Elsevier Patient Education  2020 [employer]  

## 2019-10-24 NOTE — Procedures (Signed)
Interventional Radiology Procedure Note  Procedure: CT Guided Biopsy of Peritoneal Mass  Complications: None  Estimated Blood Loss: < 10 mL  Findings: 18 G core biopsy of right peritoneal mass performed under CT guidance.  Three core samples obtained and sent to Pathology.  Venetia Night. Kathlene Cote, M.D Pager:  [phone]

## 2019-10-25 ENCOUNTER — Other Ambulatory Visit: Payer: Self-pay | Admitting: Oncology

## 2019-10-25 ENCOUNTER — Other Ambulatory Visit: Payer: Self-pay

## 2019-10-25 ENCOUNTER — Other Ambulatory Visit
Admission: RE | Admit: 2019-10-25 | Discharge: 2019-10-25 | Disposition: A | Discharge status: Home / Self Care | Payer: PPO | Source: Ambulatory Visit | Attending: Vascular Surgery | Admitting: Vascular Surgery

## 2019-10-25 ENCOUNTER — Inpatient Hospital Stay (HOSPITAL_BASED_OUTPATIENT_CLINIC_OR_DEPARTMENT_OTHER): Payer: PPO | Admitting: Obstetrics and Gynecology

## 2019-10-25 ENCOUNTER — Encounter: Payer: Self-pay | Admitting: *Deleted

## 2019-10-25 VITALS — BP 106/49 | HR 72 | Temp 96.2°F | Resp 16 | Ht 62.0 in | Wt 163.9 lb

## 2019-10-25 DIAGNOSIS — Z01812 Encounter for preprocedural laboratory examination: Secondary | ICD-10-CM | POA: Diagnosis not present

## 2019-10-25 DIAGNOSIS — C541 Malignant neoplasm of endometrium: Secondary | ICD-10-CM

## 2019-10-25 DIAGNOSIS — C786 Secondary malignant neoplasm of retroperitoneum and peritoneum: Secondary | ICD-10-CM

## 2019-10-25 DIAGNOSIS — Z20822 Contact with and (suspected) exposure to covid-19: Secondary | ICD-10-CM | POA: Insufficient documentation

## 2019-10-25 LAB — SARS CORONAVIRUS 2 (TAT 6-24 HRS): SARS Coronavirus 2: NEGATIVE

## 2019-10-25 MED ORDER — PROCHLORPERAZINE MALEATE 10 MG PO TABS: 10.0000 mg | ORAL_TABLET | Freq: Four times a day (QID) | ORAL | 1 refills | Status: DC | PRN

## 2019-10-25 MED ORDER — ONDANSETRON HCL 8 MG PO TABS: 8.0000 mg | ORAL_TABLET | Freq: Two times a day (BID) | ORAL | 1 refills | Status: DC | PRN

## 2019-10-25 MED ORDER — LIDOCAINE-PRILOCAINE 2.5-2.5 % EX CREA: TOPICAL_CREAM | CUTANEOUS | 3 refills | Status: DC

## 2019-10-25 MED ORDER — LORAZEPAM 0.5 MG PO TABS: 0.5000 mg | ORAL_TABLET | Freq: Four times a day (QID) | ORAL | 0 refills | Status: DC | PRN

## 2019-10-25 NOTE — Progress Notes (Signed)
DISCONTINUE ON PATHWAY REGIMEN - Uterine     A cycle is every 21 days:     Paclitaxel      Carboplatin   **Always confirm dose/schedule in your pharmacy ordering system**  REASON: Disease Progression PRIOR TREATMENT: UTOS50: Carboplatin/Paclitaxel (6/175) q21 Days x 6 Cycles TREATMENT RESPONSE: Progressive Disease (PD)  START OFF PATHWAY REGIMEN - Uterine   [id number]:Liposomal Doxorubicin 50 mg/m2 q28d:   A cycle is every 28 days:     Liposomal doxorubicin   **Always confirm dose/schedule in your pharmacy ordering system**  Patient Characteristics: Serous Carcinoma, Recurrent/Progressive Disease, Second Line, Relapse ? 6 Months From Prior Therapy, HER2 Negative/Unknown Histology: Serous Carcinoma Therapeutic Status: Recurrent or Progressive Disease Line of Therapy: Second Line Time to Recurrence: Relapse ? 6 Months From Prior Therapy HER2 Status: Negative Intent of Therapy: Non-Curative / Palliative Intent, Discussed with Patient

## 2019-10-25 NOTE — Progress Notes (Signed)
Gynecologic Oncology Interval Visit   Referring Provider: Dr. 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 [place] female, initially seen in consultation from [name] and Garwin Brothers for endometrial cancer, s/p RA- hysterectomy, BSO, SN mapping and biopsy, pelvic adhesiolysis 2/18 and 3 cycles carbo-taxol and WPRT,     She developed right lower quadrant pain and CT scan and PET that showed carcinomatosis.  No disease seen outside abdomen.  Biopsy done yesterday. She has significant residual neuropathy from prior chemo for this cancer and for lymphoma.   Gynecologic Oncology History Jaime Hall is a pleasant G37P2 female who was seen in consultation from [name] and Garwin Brothers for grade 1 endometrial cancer.   Endometrial biopsy done 07/15/16 showed grade 1 endometrial cancer. Surgical management was recommended.   History is also significant for h/o Hodgkin's Lymphoma 2011 treated successfully at [facility] with chemo and RT.   Bladder sling for SUI 2009.  Benign cervical polyp removed 5/16. Has some leg edema due to chronic venous insufficiency.  Had angioplasty and cardiac stents in 2013 and was on Plavix for three years, now just ASA.  On 07/29/2016 she underwent robotic hysterectomy, and bilateral salpingo-oophorectomy with washings as well as sentinel node injection, mapping, and biopsy; pelvic adhesiolysis including enterolysis and ovariolysis > 45 minutes. She was noted to have severe pelvic adhesive disease involving gynecologic organs and the bowel. The cul de sac was obliterated and adherent to the posterior cervix. A bubble test was performed and was negative.   Postop she presented with fevers on 08/05/2016 and was admitted. CT scan 08/05/1016 revealed small pockets of air in the posterior pelvic floor likely postsurgical. There is a 1.8 x 3.8 cm complex fluid collection containing pockets of  air with somewhat organized walls within the pelvis at the hysterectomy bed most consistent with developing abscess. There is superior extension of loculated fluid along the left lateral pelvic wall anterior to the left psoas muscle. The component of the loculated fluid along the left lateral pelvic wall measures 2.0 x 4.0 cm and fluid collection along the anterior surface of the left psoas muscle measures approximately 1.8 x 3.6 cm. Blood cultures: + bacterimia with bacteroides fragilis; urine cultures negative.   She was treated with IV antibiotics with improvement in her symptoms. She was discharged on 09/05/2015 on Augmentin.   She was seen by Dr. Leonides Schanz and complained of vaginal leakage. 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 Subsequently she underwent a double tied tests with oral Pyridium as well as backfilling the bladder with blue. 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  Fluid in her vaginal vault was also appreciated. She was referred to Dr. [name], Urology on 08/14/2016. No obvious fistula or leak appreciated on CT urogram, although there is no complete delayed imaging to assess the right-sided ureter.   CT urogram 08/13/2016 IMPRESSION: 1. Stable size of an abscess at the vaginal cuff with superior extension along the left pelvic sidewall to the posterior margin of the mid sigmoid colon. 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 2. No hydronephrosis. Normal caliber ureters. No evidence of contrast extravasation from the ureters or bladder, with limitations as described. 3. No evidence of metastatic disease in the abdomen or pelvis. 4. Aortic atherosclerosis.  On 08/17/3016 she underwent Exam under anesthesia, vaginoscopy, cystoscopy, cystogram with interpretation of fluoroscopy less  than 30 minutes, bilateral retrograde pyelogram, and right ureteral stent placement.   Intraoperative  findings: No obvious ureterovaginal vesicovaginal fistula appreciated. Mild right hydroureteronephrosis down to level of the distal ureter with slight medial deviation without filling defects. Slight mucopurulent drainage from left apical vaginal cuff, specimen cultured.  Culture revealed bateroides fragilis and she was started on a 2 week course of Flagyl iniated on 08/24/2016.  Stent removed on 09/22/2016.   Pathology DIAGNOSIS:  A. SENTINEL LYMPH NODE, RIGHT MID OBTURATOR; EXCISION:  - NO TUMOR SEEN IN ONE LYMPH NODE (0/1).   B. SENTINEL LYMPH NODE, RIGHT EXTERNAL ILIAC; EXCISION:  - NO TUMOR SEEN IN ONE LYMPH NODE (0/1).   C. SENTINEL LYMPH NODE, LEFT EXTERNAL ILIAC; EXCISION:  - NO TUMOR SEEN IN ONE LYMPH NODE (0/1).   D. UTERUS WITH CERVIX, BILATERAL FALLOPIAN TUBES AND OVARIES;  HYSTERECTOMY AND BILATERAL SALPINGO-OOPHORECTOMY:  - SEROUS CARCINOMA, 4.5 CM(SEE NOTE).  - LYMPHOVASCULAR INVASION PRESENT.  - NO TUMOR SEEN IN BILATERAL OVARIES AND FALLOPIAN TUBES.  ENDOMETRIUM:  Procedure: Hysterectomy with bilateral salpingo-oophorectomy  Histologic Type: Serous carcinoma  Myometrial Invasion: Present       Depth of invasion (millimeters): 13 mm  Myometrial thickness (millimeters): 14 mm       Percentage of myometrial invasion: 93%  Uterine Serosa Involvement: Not identified  Cervical Stromal Involvement: Not identified    BIOMARKER REPORTING TEMPLATE:  p53 Expression: Abnormal strong diffuse overexpression (>90%)  ER/PR negative.  MS stable Her2neu assessment was performed given serous histology and was negative.   CT scan on 09/10/2016 with findings that are very reassuring so stent removed. 1. Interval resolution of abscess involving the vaginal cuff. 2. The proximal end of the right ureteral stent is coiled in the proximal right ureter. No hydronephrosis. 3. No evidence of metastatic disease or other significant changes. 4. Aortic atherosclerosis.    She was referred to Dr. Janese Banks for adjuvant chemotherapy and completed 3 cycles of carbo/taxol. Notable side effects include neuropathy in hands due to prior chemo in past for Hodgkin's disease and more recent regimen.  Taxol dose was reduced 25% with cycle 3 due to neuropathy.    Whole pelvis radiation with Dr Baruch Gouty completed in August 2018.  CT A/P 08/31/2017 IMPRESSION: No evidence of recurrent or metastatic disease.  10/06/2017 PET  IMPRESSION: NEGATIVE FOR MALIGNANCY 1. No findings of active malignancy in the neck, chest, abdomen, or pelvis. 2. Subacute sacral insufficiency fracture with transverse component at S1-2 and vertical components in both sacral ala. There is also a nondisplaced subacute fracture of the right L5 transverse process. Low-grade associated metabolic activity favors a benign etiology. 3. Other imaging findings of potential clinical significance: Aortic Atherosclerosis (ICD10-I70.0). Coronary atherosclerosis. Mild cardiomegaly. Stable paramediastinal fibrosis. Sigmoid diverticulosis.  She has continued back pain with prior hx of sacral insufficiency fracture and fracture of L5 transverse process. She was evaluated for this and declined intervention  Problem List: Patient Active Problem List   Diagnosis Date Noted  . Goals of care, counseling/discussion 10/20/2019  . Peritoneal carcinomatosis ([place]) 10/20/2019  . Peritoneal metastases ([place]) 10/16/2019  . Flank pain 10/04/2019  . Right lower quadrant abdominal pain 10/04/2019  . Candidiasis 10/04/2019  . Right anterior knee pain 07/25/2019  . Educated about COVID-19 virus infection 11/15/2018  . Cramps, muscle, general 04/06/2018  . Chronic kidney disease, stage 2, mildly decreased GFR 10/05/2017  . B12 deficiency anemia 07/24/2017  . Normocytic anemia 07/18/2017  . Back pain 07/18/2017  . Chemotherapy-induced peripheral neuropathy (  [place]) 12/10/2016  . Hyperlipidemia 08/13/2016  . Hx of colonic polyps 08/13/2016   . Benign neoplasm of sigmoid colon 08/13/2016  . Endometrial cancer ([place]) 08/13/2016  . 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 Lymphedema 07/20/2016  . Chronic venous insufficiency 07/20/2016  . PAD (peripheral artery disease) ([place]) 07/20/2016  . Postmenopausal bleeding 07/14/2016  . Class 1 obesity due to excess calories without serious comorbidity with body mass index (BMI) of 34.0 to 34.9 in adult 05/27/2016  . Leg cramps 05/27/2016  . Bronchiectasis ([place]) 12/19/2015  . Pre-syncope 12/19/2015  . Prolapsed, uterovaginal, incomplete 06/24/2014  . Routine general medical examination at a health care facility 12/30/2012  . Obesity (BMI 30-39.9) 12/30/2012  . Hx of multiple pulmonary nodules 12/28/2012  . Plantar fasciitis of right foot 12/28/2012  . Neuropathy associated with lymphoma ([place]) 09/12/2012  . Hip pain, bilateral 09/12/2012  . History of Hodgkin's lymphoma 09/12/2012  . Coronary artery disease   . Chest pain on exertion 02/29/2012    Past Medical History: Past Medical History:  Diagnosis Date  . Anxiety   . Cervicalgia   . Coronary artery disease    a. 02/2012 Stress echo: severe anterior wall ischemia;  b. 02/2012 Cath/PCI: LAD 95p (3.0 x 15 Xience EX DES), D1 90ost (PTCA - bifurcational dzs), EF 45% with anterior HK;  b. 02/2013 Ex MV: fixed anterior defect w/ minor reversibility, nl EF-->Med Rx.  . Endometrial cancer ([place])    a. 07/2016 s/p robotic hysterectomy, BSO w/ washings, sentinel node inj, mapping, bx, adhesiolysis.  . Essential hypertension, benign   . Fibrocystic breast disease   . GERD (gastroesophageal reflux disease)   . Gestational hypertension   . Heart murmur   . History of anemia   . History of blood transfusion   . Hodgkin's lymphoma ([place]) 2011   a. s/p radiation and chemo therapy  . Osteoarthritis   . Polycystic ovarian disease     Past Surgical History: Past Surgical History:  Procedure Laterality Date  .  ABDOMINAL HYSTERECTOMY    . bladder sling    . CARDIAC CATHETERIZATION  02/2012   [facility] 1 stent place  . CERVICAL POLYPECTOMY    . CHOLECYSTECTOMY  1982  . COLONOSCOPY WITH PROPOFOL N/A 02/05/2015   Procedure: COLONOSCOPY WITH PROPOFOL;  Surgeon: Lucilla Lame, MD;  Location: [facility] ENDOSCOPY;  Service: Endoscopy;  Laterality: N/A;  . CORONARY ANGIOPLASTY  02/2012   left/right s/p balloon  . CYSTOGRAM N/A 08/17/2016   Procedure: CYSTOGRAM;  Surgeon: Hollice Espy, MD;  Location: [facility] ORS;  Service: Urology;  Laterality: N/A;  . CYSTOSCOPY N/A 08/17/2016   Procedure: CYSTOSCOPY EXAM UNDER ANESTHESIA;  Surgeon: Hollice Espy, MD;  Location: [facility] ORS;  Service: Urology;  Laterality: N/A;  . CYSTOSCOPY W/ RETROGRADES Bilateral 08/17/2016   Procedure: CYSTOSCOPY WITH RETROGRADE PYELOGRAM;  Surgeon: Hollice Espy, MD;  Location: [facility] ORS;  Service: Urology;  Laterality: Bilateral;  . CYSTOSCOPY WITH STENT PLACEMENT Right 08/17/2016   Procedure: CYSTOSCOPY WITH STENT PLACEMENT;  Surgeon: Hollice Espy, MD;  Location: [facility] ORS;  Service: Urology;  Laterality: Right;  . heart stent'  2013  . kidney stent Right 2018  . LYMPH NODE BIOPSY  2011   diagnosis of hodgkins lymphoma  . PELVIC LYMPH NODE DISSECTION N/A 07/29/2016   Procedure: PELVIC/AORTIC LYMPH NODE SAMPLING;  Surgeon: [name], MD;  Location: [facility] ORS;  Service: Gynecology;  Laterality: N/A;  . PORTA CATH INSERTION N/A 09/22/2016   Procedure: Glori Luis  Cath Insertion;  Surgeon: [name], MD;  Location: [place] [facility];  Service: Cardiovascular;  Laterality: N/A;  . PORTA CATH REMOVAL N/A 11/17/2016   Procedure: Glori Luis Cath Removal;  Surgeon: [name], MD;  Location: [place] [facility];  Service: Cardiovascular;  Laterality: N/A;  . ROBOTIC ASSISTED TOTAL HYSTERECTOMY WITH BILATERAL SALPINGO OOPHERECTOMY N/A 07/29/2016   Procedure: ROBOTIC ASSISTED TOTAL HYSTERECTOMY WITH BILATERAL SALPINGO OOPHORECTOMY;  Surgeon:  [name], MD;  Location: [facility] ORS;  Service: Gynecology;  Laterality: N/A;  . SENTINEL NODE BIOPSY N/A 07/29/2016   Procedure: SENTINEL NODE BIOPSY;  Surgeon: [name], MD;  Location: [facility] ORS;  Service: Gynecology;  Laterality: N/A;  . transobturator sling N/A 2009   [place]    Family History: Family History  Problem Relation Age of Onset  . ALS Father   . Polymyositis Father   . Diabetes Brother   . Cancer Maternal Aunt        breast  . Breast cancer Maternal Aunt        30's  . Stroke Maternal Grandmother   . Cancer Maternal Grandfather        prostate  . Stroke Maternal Grandfather   . Colon cancer Maternal Aunt   . Non-Hodgkin's lymphoma Cousin     Social History: Works as Dealer Social History   Socioeconomic History  . Marital status: Widowed    Spouse name: Not on file  . Number of children: Not on file  . Years of education: Not on file  . Highest education level: Not on file  Occupational History  . Not on file  Tobacco Use  . Smoking status: Former Smoker    Packs/day: 1.00    Years: 30.00    Pack years: 30.00    Types: Cigarettes    Quit date: 07/24/2002    Years since quitting: 17.2  . Smokeless tobacco: Never Used  . Tobacco comment: quit smoking in 2000  Substance and Sexual Activity  . Alcohol use: Yes    Comment: rarely-liquor  . Drug use: No  . Sexual activity: Never  Other Topics Concern  . 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    Social Determinants of Health   Financial Resource Strain:   . Difficulty of Paying Living Expenses:   Food Insecurity:   . Worried About Charity fundraiser in the Last Year:   . Arboriculturist in the Last Year:   Transportation Needs:   . Film/video editor (Medical):   Marland Kitchen Lack of Transportation (Non-Medical):   Physical Activity:   . Days of Exercise per Week:   .  Minutes of Exercise per Session:   Stress:   . Feeling of Stress :   Social Connections:   . Frequency of Communication with Friends and Family:   . Frequency of Social Gatherings with Friends and Family:   . Attends Religious Services:   . Active Member of Clubs or Organizations:   . Attends Archivist Meetings:   Marland Kitchen Marital Status:   Intimate Partner Violence:   . Fear of Current or Ex-Partner:   . Emotionally Abused:   Marland Kitchen Physically Abused:   . Sexually Abused:     Allergies: Allergies  Allergen Reactions  . Adhesive [Tape] Other (See Comments)    Burns the skin  . Z-Pak [Azithromycin]  Itching  . Antifungal [Miconazole Nitrate] Rash  . Sulfa Antibiotics Nausea And Vomiting and Rash    N/T    Current Medications: Current Outpatient Medications  Medication Sig Dispense Refill  . aspirin EC 81 MG tablet Take 81 mg by mouth at bedtime.    Marland Kitchen atorvastatin (LIPITOR) 10 MG tablet Take 1 tablet by mouth once daily (Patient taking differently: Take 10 mg by mouth every evening. ) 90 tablet 2  . cholecalciferol (VITAMIN D3) 25 MCG (1000 UNIT) tablet Take 1,000 Units by mouth in the morning and at bedtime.    . cyanocobalamin (,VITAMIN B-12,) 1000 MCG/ML injection INJECT 1ML IM WEEKLY FOR 4 WEEKS, THEN INJECT MONTHLY THEREAFTER (Patient taking differently: Inject 1,000 mcg into the skin every 30 (thirty) days. ) 10 mL 0  . diazepam (VALIUM) 5 MG tablet Take 1 tablet (5 mg total) by mouth every 12 (twelve) hours as needed for anxiety. (Patient taking differently: Take 2.5 mg by mouth every 12 (twelve) hours as needed for anxiety. ) 30 tablet 0  . DM-APAP-CPM (CORICIDIN HBP PO) Take 1 tablet by mouth daily as needed (allergies).    . DULoxetine (CYMBALTA) 30 MG capsule Take 1 capsule by mouth once daily (Patient taking differently: Take 30 mg by mouth daily. ) 90 capsule 0  . esomeprazole (NEXIUM) 20 MG capsule Take 20 mg by mouth every other day.    . metoprolol tartrate  (LOPRESSOR) 25 MG tablet Take 1 tablet by mouth twice daily (Patient taking differently: Take 25 mg by mouth 2 (two) times daily. ) 180 tablet 2  . nitroGLYCERIN (NITROSTAT) 0.4 MG SL tablet Place 1 tablet (0.4 mg total) under the tongue every 5 (five) minutes as needed for chest pain. 25 tablet 3  . nystatin (MYCOSTATIN/NYSTOP) powder Apply 1 application topically 2 (two) times daily. To rash until resolved. 30 g 0  . Syringe/Needle, Disp, (SYRINGE 3CC/25GX1") 25G X 1" 3 ML MISC Use for b12 injections 50 each 0  . Turmeric 500 MG CAPS Take 500 mg by mouth daily.      No current facility-administered medications for this visit.    Review of Systems General: fatigue  HEENT: no complaints  Lungs: no complaints  Cardiac: no complaints  GI: no complaints  GU: no complaints of bladder dysfunction today  Musculoskeletal: leg cramps noted below  Extremities: cramps in legs, chronic  Skin: no complaints  Neuro: peripheral neuropathy, chronic  Endocrine: no complaints  Psych: feeling sad     Objective:  Physical Examination:  BP (!) 106/49   Pulse 72   Temp (!) 96.2 F (35.7 C) (Tympanic)   Resp 16   Ht '5\' 2"'  (1.575 m)   Wt 163 lb 14.4 oz (74.3 kg)   BMI 29.98 kg/m    ECOG Performance Status: 1 - Symptomatic but completely ambulatory  GENERAL: Patient is a well appearing female in no acute distress HEENT:  PERRL, neck supple with midline trachea. Thyroid without masses.  NODES:  No cervical, supraclavicular, axillary, or inguinal lymphadenopathy palpated.  LUNGS:  Clear to auscultation bilaterally.  No wheezes or rhonchi. HEART:  Regular rate and rhythm. No murmur appreciated. ABDOMEN:  Soft, nontender.  Positive, normoactive bowel sounds.  MSK:  No focal spinal tenderness to palpation. Full range of motion bilaterally in the upper extremities. EXTREMITIES:  No peripheral edema.   SKIN:  Clear with no obvious rashes or skin changes. No nail dyscrasia. NEURO:  Nonfocal. Well  oriented.  Appropriate affect.  Pelvic:  EGBUS: no lesions Cervix: no lesions, nontender, mobile Vagina: no lesions, no discharge or bleeding Bimanual/RV: hard mass above vaginal cuff  Radiology CT scan 10/16/19 Multiple peritoneal soft tissue masses and nodules are seen, mainly in the right abdomen and pelvis. These are new since previous study. Largest index mass is located in the anterior right abdomen just deep to the abdominal wall muscles, measuring 5.7 x 4.7 cm on image 51/2. Another index mass in the right lower quadrant mesentery measures 5.0 x 3.5 cm    Assessment:  Jaime Hall is a 69 y.o. female diagnosed with stage IB high grade serous endometrial cancer, high intermediate risk disease s/p RA- hysterectomy, BSO, SN mapping and biopsy, pelvic adhesiolysis 2/18 and 3 cycles carbo-taxol and WPRT.  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 Complete resolution of vaginal cuff abscess.  Severe pelvic adhesive disease.    Cancer is Microsatellite Stable. HER2 negative, ER/PR negative.   Developed abdominal pain and CT/PET scan in 5/21 showed recurrent disease with carcinomatosis and biopsy is pending.  Dr. Janese Banks has suggested carboplatin/gemcitabine chemotherapy in view of significant neuropathy in feet from prior treatment for this cancer and for lymphoma.     Medical co-morbidities complicating care: 30 pack year former smoker s/p coronary angioplasty and stents 2013 and takes baby ASA. Prior chemo and RT for lymphoma.    Plan:   Problem List Items Addressed This Visit      Genitourinary   Endometrial cancer ([place]) - Primary     She will start chemotherapy with Dr. Janese Banks next week.  Suggest carboplatin and doxil, as this will likely be well tolerated.  CT scan after 3 cycles to  assess response.  Would also get baseline CA125, as it may be elevated now with recurrence.  She will have an IV port placed tomorrow.    If she does not respond well to carbo/doxil would consider Keytruda/Lenvima therapy.  There is a study open at [facility] with a similar regimen of Nivolumab/Lucitinib.   A total of at least 25 minutes were spent with the patient/family today; >50% was spent in education, counseling and coordination of care for endometrial cancer.   Mellody Drown, MD  CC:  [name], MD [address] [place], [place] [address] [phone]  Larey Days, MD  Hollice Espy, MD

## 2019-10-25 NOTE — Progress Notes (Signed)
Pt has some soreness and pain in right pelvic from radiation.

## 2019-10-26 ENCOUNTER — Other Ambulatory Visit (INDEPENDENT_AMBULATORY_CARE_PROVIDER_SITE_OTHER): Payer: Self-pay | Admitting: Nurse Practitioner

## 2019-10-26 ENCOUNTER — Telehealth: Payer: Self-pay | Admitting: Cardiovascular Disease

## 2019-10-26 NOTE — Telephone Encounter (Signed)
[place] nurse for Dr Janese Banks calling States that patient's cancer has returned and they are wanting to start chemo 11/06/19 Patient would need to be evaluated by cardio before starting  Patient has an ECHO on 11/02/19 and would like for patient to be seen by our office on 11/03/19 but nothing is currently available We scheduled patient to see C Walker on 05/20 at 2:30pm - would the results of the ECHO be available at this time in order to determine if she is cleared for chemo Please advise or contact Judeen Hammans at [phone]

## 2019-10-26 NOTE — Telephone Encounter (Signed)
Dr. Garen Lah is the echo reader that day. I will send him a message to ask him to expedite the patients echo results so that they can be available for her 2:30pm appt.

## 2019-10-27 ENCOUNTER — Encounter
Admission: RE | Disposition: A | Discharge status: Home / Self Care | Payer: Self-pay | Source: Home / Self Care | Attending: Vascular Surgery

## 2019-10-27 ENCOUNTER — Ambulatory Visit
Admission: RE | Admit: 2019-10-27 | Discharge: 2019-10-27 | Disposition: A | Discharge status: Home / Self Care | Payer: PPO | Attending: Vascular Surgery | Admitting: Vascular Surgery

## 2019-10-27 ENCOUNTER — Other Ambulatory Visit: Payer: Self-pay

## 2019-10-27 ENCOUNTER — Encounter: Payer: Self-pay | Admitting: Vascular Surgery

## 2019-10-27 DIAGNOSIS — K219 Gastro-esophageal reflux disease without esophagitis: Secondary | ICD-10-CM | POA: Diagnosis not present

## 2019-10-27 DIAGNOSIS — I739 Peripheral vascular disease, unspecified: Secondary | ICD-10-CM | POA: Insufficient documentation

## 2019-10-27 DIAGNOSIS — Z79899 Other long term (current) drug therapy: Secondary | ICD-10-CM | POA: Diagnosis not present

## 2019-10-27 DIAGNOSIS — Z8572 Personal history of non-Hodgkin lymphomas: Secondary | ICD-10-CM | POA: Insufficient documentation

## 2019-10-27 DIAGNOSIS — N182 Chronic kidney disease, stage 2 (mild): Secondary | ICD-10-CM | POA: Diagnosis not present

## 2019-10-27 DIAGNOSIS — Z6829 Body mass index (BMI) 29.0-29.9, adult: Secondary | ICD-10-CM | POA: Insufficient documentation

## 2019-10-27 DIAGNOSIS — C55 Malignant neoplasm of uterus, part unspecified: Secondary | ICD-10-CM | POA: Diagnosis not present

## 2019-10-27 DIAGNOSIS — Z955 Presence of coronary angioplasty implant and graft: Secondary | ICD-10-CM | POA: Diagnosis not present

## 2019-10-27 DIAGNOSIS — Z87891 Personal history of nicotine dependence: Secondary | ICD-10-CM | POA: Insufficient documentation

## 2019-10-27 DIAGNOSIS — D509 Iron deficiency anemia, unspecified: Secondary | ICD-10-CM | POA: Insufficient documentation

## 2019-10-27 DIAGNOSIS — Z7982 Long term (current) use of aspirin: Secondary | ICD-10-CM | POA: Diagnosis not present

## 2019-10-27 DIAGNOSIS — I129 Hypertensive chronic kidney disease with stage 1 through stage 4 chronic kidney disease, or unspecified chronic kidney disease: Secondary | ICD-10-CM | POA: Diagnosis not present

## 2019-10-27 DIAGNOSIS — I890 Lymphedema, not elsewhere classified: Secondary | ICD-10-CM | POA: Insufficient documentation

## 2019-10-27 DIAGNOSIS — E669 Obesity, unspecified: Secondary | ICD-10-CM | POA: Diagnosis not present

## 2019-10-27 DIAGNOSIS — I2510 Atherosclerotic heart disease of native coronary artery without angina pectoris: Secondary | ICD-10-CM | POA: Diagnosis not present

## 2019-10-27 DIAGNOSIS — E785 Hyperlipidemia, unspecified: Secondary | ICD-10-CM | POA: Insufficient documentation

## 2019-10-27 DIAGNOSIS — C541 Malignant neoplasm of endometrium: Secondary | ICD-10-CM

## 2019-10-27 HISTORY — PX: PORTA CATH INSERTION: CATH118285

## 2019-10-27 LAB — SURGICAL PATHOLOGY

## 2019-10-27 SURGERY — PORTA CATH INSERTION
Anesthesia: Moderate Sedation

## 2019-10-27 MED ORDER — HYDROMORPHONE HCL 1 MG/ML IJ SOLN: 1.0000 mg | Freq: Once | INTRAMUSCULAR | Status: DC | PRN

## 2019-10-27 MED ORDER — MIDAZOLAM HCL 5 MG/5ML IJ SOLN
INTRAMUSCULAR | Status: AC
  Filled 2019-10-27: qty 5

## 2019-10-27 MED ORDER — CEFAZOLIN SODIUM-DEXTROSE 2-4 GM/100ML-% IV SOLN
INTRAVENOUS | Status: AC
  Administered 2019-10-27: 2 g via INTRAVENOUS
  Filled 2019-10-27: qty 100

## 2019-10-27 MED ORDER — MIDAZOLAM HCL 2 MG/ML PO SYRP: 8.0000 mg | ORAL_SOLUTION | Freq: Once | ORAL | Status: DC | PRN

## 2019-10-27 MED ORDER — METHYLPREDNISOLONE SODIUM SUCC 125 MG IJ SOLR: 125.0000 mg | Freq: Once | INTRAMUSCULAR | Status: DC | PRN

## 2019-10-27 MED ORDER — DIPHENHYDRAMINE HCL 50 MG/ML IJ SOLN: 50.0000 mg | Freq: Once | INTRAMUSCULAR | Status: DC | PRN

## 2019-10-27 MED ORDER — SODIUM CHLORIDE 0.9 % IV BOLUS
INTRAVENOUS | Status: AC | PRN
  Administered 2019-10-27: 500 mL via INTRAVENOUS

## 2019-10-27 MED ORDER — CEFAZOLIN SODIUM-DEXTROSE 2-4 GM/100ML-% IV SOLN: 2.0000 g | Freq: Once | INTRAVENOUS | Status: AC

## 2019-10-27 MED ORDER — MIDAZOLAM HCL 2 MG/2ML IJ SOLN
INTRAMUSCULAR | Status: DC | PRN
  Administered 2019-10-27: 0.5 mg via INTRAVENOUS
  Administered 2019-10-27: 1 mg via INTRAVENOUS
  Administered 2019-10-27: 2 mg via INTRAVENOUS

## 2019-10-27 MED ORDER — FENTANYL CITRATE (PF) 100 MCG/2ML IJ SOLN
INTRAMUSCULAR | Status: DC | PRN
  Administered 2019-10-27: 25 ug via INTRAVENOUS
  Administered 2019-10-27 (×2): 50 ug via INTRAVENOUS

## 2019-10-27 MED ORDER — FAMOTIDINE 20 MG PO TABS: 40.0000 mg | ORAL_TABLET | Freq: Once | ORAL | Status: DC | PRN

## 2019-10-27 MED ORDER — SODIUM CHLORIDE 0.9 % IV SOLN: INTRAVENOUS | Status: DC

## 2019-10-27 MED ORDER — FENTANYL CITRATE (PF) 100 MCG/2ML IJ SOLN
INTRAMUSCULAR | Status: AC
  Filled 2019-10-27: qty 2

## 2019-10-27 MED ORDER — ONDANSETRON HCL 4 MG/2ML IJ SOLN: 4.0000 mg | Freq: Four times a day (QID) | INTRAMUSCULAR | Status: DC | PRN

## 2019-10-27 SURGICAL SUPPLY — 13 items
DERMABOND ADVANCED (GAUZE/BANDAGES/DRESSINGS) ×1
DERMABOND ADVANCED .7 DNX12 (GAUZE/BANDAGES/DRESSINGS) ×1 IMPLANT
DRAPE INCISE IOBAN 66X45 STRL (DRAPES) ×2 IMPLANT
GLIDEWIRE STIFF .35X180X3 HYDR (WIRE) ×2 IMPLANT
GUIDEWIRE AMPLATZ SHORT (WIRE) ×2 IMPLANT
KIT PORT POWER 8FR ISP CVUE (Port) ×2 IMPLANT
NEEDLE ENTRY 21GA 7CM ECHOTIP (NEEDLE) ×2 IMPLANT
PACK ANGIOGRAPHY (CUSTOM PROCEDURE TRAY) ×2 IMPLANT
SET INTRO CAPELLA COAXIAL (SET/KITS/TRAYS/PACK) ×2 IMPLANT
SUT MNCRL AB 4-0 PS2 18 (SUTURE) ×2 IMPLANT
SUT PROLENE 0 CT 1 30 (SUTURE) ×2 IMPLANT
SUT VIC AB 3-0 CT1 27 (SUTURE) ×1
SUT VIC AB 3-0 CT1 TAPERPNT 27 (SUTURE) ×1 IMPLANT

## 2019-10-27 NOTE — Op Note (Signed)
OPERATIVE NOTE   PROCEDURE: 1. Placement of a right IJ Infuse-a-Port  PRE-OPERATIVE DIAGNOSIS: Uterine carcinoma  POST-OPERATIVE DIAGNOSIS: Uterine carcinoma  SURGEON: [name] M.D.  ANESTHESIA: Conscious sedation was administered under my direct supervision by the interventional radiology RN. IV Versed plus fentanyl were utilized. Continuous ECG, pulse oximetry and blood pressure was monitored throughout the entire procedure. Conscious sedation was for a total of 40 minutes.  ESTIMATED BLOOD LOSS: Minimal   FINDING(S): 1.  Patent vein  SPECIMEN(S): None  INDICATIONS:   Jaime Hall is a 69 y.o. female who presents with uterine carcinoma who requires parenteral chemotherapy and therefore appropriate parenteral access.  Placement of an Infuse-a-Port has been reviewed and the patient wishes to proceed.  Risks and benefits were discussed..  DESCRIPTION: After obtaining full informed written consent, the patient was brought back to the special procedure suite and placed in the supine position. The patient's right neck and chest wall are prepped and draped in sterile fashion. Appropriate timeout was called.  Ultrasound is placed in a sterile sleeve, ultrasound is utilized to avoid vascular injury as well as secondary to lack of appropriate landmarks. The right internal jugular vein is identified. It is echolucent and homogeneous as well as easily compressible indicating patency. An image is recorded for the permanent record.  Access to the vein with a micropuncture needle is done under direct ultrasound visualization.  1% lidocaine is infiltrated into the soft tissue at the base of the neck as well as on the chest wall.  Under direct ultrasound visualization a micro-needle is inserted into the vein followed by the micro-wire. Micro-sheath was then advanced and a J wire is inserted without difficulty under fluoroscopic guidance. A small counterincision was created at the wire  insertion site. A transverse incision is created 2 fingerbreadths below the scapula and a pocket is fashioned using both blunt and sharp dissection. The pocket is tested for appropriate size with the hub of the Infuse-a-Port. The tunneling device is then used to pull the intravascular portion of the catheter from the pocket to the neck counterincision.  Dilator and peel-away sheath were then inserted over the wire and the wire is removed. Catheter is then advanced into the venous system without difficulty. Peel-away sheath was then removed.  Catheter is then positioned under fluoroscopic guidance at the atrial caval junction. It is then transected connected to the hub and the hope is slipped into the subcutaneous pocket on the chest wall. The hub was then accessed percutaneously and aspirates easily and flushes well and is flushed with 30 cc of heparinized saline. The pocket incision is then closed in layers using interrupted 3-0 Vicryl for the subcutaneous tissues and 4-0 Monocryl subcuticular for skin closure. Dermabond is applied. The neck counterincision was closed with 4-0 Monocryl subcuticular and Dermabond as well.  The patient tolerated the procedure well and there were no immediate complications.  COMPLICATIONS: None  CONDITION: Unchanged  [name] M.D. [facility] Office: [phone]   10/27/2019, 10:03 AM

## 2019-10-27 NOTE — H&P (Signed)
 [facility] History & Physical Update  The patient was interviewed and re-examined.  The patient's previous History and Physical has been reviewed and is unchanged.  Patient has uterine carcinoma and is requiring chemotherapy for treatment.  Infuse-a-Port is required for appropriate parenteral access.  This has been discussed with the patient.  There is no change in the plan of care. We plan to proceed with the scheduled procedure.  Hortencia Pilar, MD  10/27/2019, 10:01 AM

## 2019-10-30 ENCOUNTER — Inpatient Hospital Stay: Payer: PPO | Admitting: Oncology

## 2019-10-30 ENCOUNTER — Inpatient Hospital Stay: Payer: PPO

## 2019-10-30 NOTE — Progress Notes (Signed)
Pharmacist Chemotherapy Monitoring - Follow Up Assessment    I verify that I have reviewed each item in the below checklist:  . Regimen for the patient is scheduled for the appropriate day and plan matches scheduled date. Marland Kitchen Appropriate non-routine labs are ordered dependent on drug ordered. . If applicable, additional medications reviewed and ordered per protocol based on lifetime cumulative doses and/or treatment regimen.   Plan for follow-up and/or issues identified: Yes . I-vent associated with next due treatment: No . MD and/or nursing notified: Yes  Carwile,Matthew K 10/30/2019 8:10 AM

## 2019-10-30 NOTE — Telephone Encounter (Signed)
Thanks for the heads up.   Blong Busk S Cellie Dardis, NP  

## 2019-10-31 ENCOUNTER — Telehealth: Payer: Self-pay | Admitting: *Deleted

## 2019-10-31 ENCOUNTER — Telehealth: Payer: Self-pay

## 2019-10-31 NOTE — Telephone Encounter (Signed)
Spoke with pathology. [facility] 10/30/19.

## 2019-10-31 NOTE — Telephone Encounter (Signed)
Called pt to let her know that dr. Janese Banks wanted pt to see cardiologist after echo and let them determine if she is ok to take doxil or not based on echo results. Pt agreeable to the plan

## 2019-11-02 ENCOUNTER — Other Ambulatory Visit: Payer: Self-pay

## 2019-11-02 ENCOUNTER — Other Ambulatory Visit: Payer: Self-pay | Admitting: Oncology

## 2019-11-02 ENCOUNTER — Ambulatory Visit (INDEPENDENT_AMBULATORY_CARE_PROVIDER_SITE_OTHER): Payer: PPO | Admitting: Family

## 2019-11-02 ENCOUNTER — Encounter: Payer: Self-pay | Admitting: Family

## 2019-11-02 ENCOUNTER — Ambulatory Visit
Admission: RE | Admit: 2019-11-02 | Discharge: 2019-11-02 | Disposition: A | Discharge status: Home / Self Care | Payer: PPO | Source: Ambulatory Visit | Attending: Oncology | Admitting: Oncology

## 2019-11-02 ENCOUNTER — Telehealth: Payer: Self-pay | Admitting: *Deleted

## 2019-11-02 ENCOUNTER — Encounter: Payer: Self-pay | Admitting: Oncology

## 2019-11-02 VITALS — BP 110/82 | HR 85 | Ht 61.75 in | Wt 163.1 lb

## 2019-11-02 DIAGNOSIS — Z87891 Personal history of nicotine dependence: Secondary | ICD-10-CM | POA: Diagnosis not present

## 2019-11-02 DIAGNOSIS — I2510 Atherosclerotic heart disease of native coronary artery without angina pectoris: Secondary | ICD-10-CM | POA: Insufficient documentation

## 2019-11-02 DIAGNOSIS — I10 Essential (primary) hypertension: Secondary | ICD-10-CM | POA: Diagnosis not present

## 2019-11-02 DIAGNOSIS — C786 Secondary malignant neoplasm of retroperitoneum and peritoneum: Secondary | ICD-10-CM | POA: Diagnosis not present

## 2019-11-02 DIAGNOSIS — I083 Combined rheumatic disorders of mitral, aortic and tricuspid valves: Secondary | ICD-10-CM | POA: Diagnosis not present

## 2019-11-02 DIAGNOSIS — C801 Malignant (primary) neoplasm, unspecified: Secondary | ICD-10-CM

## 2019-11-02 DIAGNOSIS — E785 Hyperlipidemia, unspecified: Secondary | ICD-10-CM | POA: Diagnosis not present

## 2019-11-02 DIAGNOSIS — Z01818 Encounter for other preprocedural examination: Secondary | ICD-10-CM | POA: Insufficient documentation

## 2019-11-02 DIAGNOSIS — I5042 Chronic combined systolic (congestive) and diastolic (congestive) heart failure: Secondary | ICD-10-CM

## 2019-11-02 NOTE — Telephone Encounter (Signed)
Called pt to let her know that due to ECHO EF % we will not be able to give her doxil. Seh will now get [place] and gemzar. She said the go the message after she got out office and she is fine with that. See her next week

## 2019-11-02 NOTE — Progress Notes (Signed)
*  PRELIMINARY RESULTS* Echocardiogram 2D Echocardiogram has been performed.  Jaime Hall 11/02/2019, 10:27 AM

## 2019-11-02 NOTE — Progress Notes (Signed)
Office Visit    Patient Name: Jaime Hall Date of Encounter: 11/02/2019  Primary Care Provider:  [name], MD Primary Cardiologist:  Jaime Sacramento, MD Electrophysiologist:  None   Chief Complaint    Jaime Hall is a 69 y.o. female with a hx of CAD, Hodgkin's lymphoma, uterine cancer presents today for follow-up after echo and cardiovascular clearance  Past Medical History    Past Medical History:  Diagnosis Date  . Anxiety   . Cervicalgia   . Coronary artery disease    a. 02/2012 Stress echo: severe anterior wall ischemia;  b. 02/2012 Cath/PCI: LAD 95p (3.0 x 15 Xience EX DES), D1 90ost (PTCA - bifurcational dzs), EF 45% with anterior HK;  b. 02/2013 Ex MV: fixed anterior defect w/ minor reversibility, nl EF-->Med Rx.  . Endometrial cancer ([place])    a. 07/2016 s/p robotic hysterectomy, BSO w/ washings, sentinel node inj, mapping, bx, adhesiolysis.  . Essential hypertension, benign   . Fibrocystic breast disease   . GERD (gastroesophageal reflux disease)   . Gestational hypertension   . Heart murmur   . History of anemia   . History of blood transfusion   . Hodgkin's lymphoma ([place]) 2011   a. s/p radiation and chemo therapy  . Osteoarthritis   . Polycystic ovarian disease    Past Surgical History:  Procedure Laterality Date  . ABDOMINAL HYSTERECTOMY    . bladder sling    . CARDIAC CATHETERIZATION  02/2012   [facility] 1 stent place  . CERVICAL POLYPECTOMY    . CHOLECYSTECTOMY  1982  . COLONOSCOPY WITH PROPOFOL N/A 02/05/2015   Procedure: COLONOSCOPY WITH PROPOFOL;  Surgeon: Jaime Lame, MD;  Location: [facility] ENDOSCOPY;  Service: Endoscopy;  Laterality: N/A;  . CORONARY ANGIOPLASTY  02/2012   left/right s/p balloon  . CYSTOGRAM N/A 08/17/2016   Procedure: CYSTOGRAM;  Surgeon: Jaime Espy, MD;  Location: [facility] ORS;  Service: Urology;  Laterality: N/A;  . CYSTOSCOPY N/A 08/17/2016   Procedure: CYSTOSCOPY EXAM UNDER ANESTHESIA;  Surgeon: Jaime Espy, MD;   Location: [facility] ORS;  Service: Urology;  Laterality: N/A;  . CYSTOSCOPY W/ RETROGRADES Bilateral 08/17/2016   Procedure: CYSTOSCOPY WITH RETROGRADE PYELOGRAM;  Surgeon: Jaime Espy, MD;  Location: [facility] ORS;  Service: Urology;  Laterality: Bilateral;  . CYSTOSCOPY WITH STENT PLACEMENT Right 08/17/2016   Procedure: CYSTOSCOPY WITH STENT PLACEMENT;  Surgeon: Jaime Espy, MD;  Location: [facility] ORS;  Service: Urology;  Laterality: Right;  . heart stent'  2013  . kidney stent Right 2018  . LYMPH NODE BIOPSY  2011   diagnosis of hodgkins lymphoma  . PELVIC LYMPH NODE DISSECTION N/A 07/29/2016   Procedure: PELVIC/AORTIC LYMPH NODE SAMPLING;  Surgeon: Jaime Ends, MD;  Location: [facility] ORS;  Service: Gynecology;  Laterality: N/A;  . PORTA CATH INSERTION N/A 09/22/2016   Procedure: Glori Luis Cath Insertion;  Surgeon: Jaime Cabal, MD;  Location: [facility];  Service: Cardiovascular;  Laterality: N/A;  . PORTA CATH INSERTION N/A 10/27/2019   Procedure: PORTA CATH INSERTION;  Surgeon: Jaime Cabal, MD;  Location: [place] [facility];  Service: Cardiovascular;  Laterality: N/A;  . PORTA CATH REMOVAL N/A 11/17/2016   Procedure: Glori Luis Cath Removal;  Surgeon: Jaime Cabal, MD;  Location: [place] [facility];  Service: Cardiovascular;  Laterality: N/A;  . ROBOTIC ASSISTED TOTAL HYSTERECTOMY WITH BILATERAL SALPINGO OOPHERECTOMY N/A 07/29/2016   Procedure: ROBOTIC ASSISTED TOTAL HYSTERECTOMY WITH BILATERAL SALPINGO OOPHORECTOMY;  Surgeon: Jaime Ends,  MD;  Location: [facility];  Service: Gynecology;  Laterality: N/A;  . SENTINEL NODE BIOPSY N/A 07/29/2016   Procedure: SENTINEL NODE BIOPSY;  Surgeon: Jaime Ends, MD;  Location: [facility];  Service: Gynecology;  Laterality: N/A;  . transobturator sling N/A 2009   [place]    Allergies  Allergies  Allergen Reactions  . Adhesive [Tape] Other (See Comments)    Burns the skin  . Z-Pak [Azithromycin] Itching  .  Antifungal [Miconazole Nitrate] Rash  . Sulfa Antibiotics Nausea And Vomiting and Rash    N/T    History of Present Illness    Jaime Hall is a 69 y.o. female with a hx of CAD, Hodgkin's lymphoma, uterine cancer. She was last seen by Jaime Hall in November 2020.  History of Hodgkin's lymphoma treated with radiation and chemotherapy to the chest area previously in remission since 2011.  Uterine cancer in 2018 treated with surgery and chemotherapy.  She is a former smoker.  Her CAD is s/p angioplasty and DES to proximal LAD with balloon angioplasty to the diagonal September 2013.  Treadmill nuclear stress test September 2014 fixed anterior wall defect with minor reversibility and normal LVEF.   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 She had portacath placed 10/27/19.   Works as a [employer] manager. Works six hour shifts five days per week. Notes some LE edema at end of shift, but resolves by morning. This only occurs in her left leg where lymph nodes were previously removed.   When asked about her CAD hx shares that she had routine stress test after her first round of chemo back in 2013 and was taken off the treadmill due to changes. She was asymptomatic at the time.   Previous chemotherapy with 8 rounds of Adriamycin with her Hodgkin's lymphoma ten years ago.   Has had inner ear virus in past and tells me she will have an "occasional spin" or dizzy sensation but not often. Will occasionally get lightheaded which she attributes to her BP dropping with Cymbalta and Metoprolol. Does try to make position changes carefully.   She has some fused cells in her chest and she will occasionally get a burning sensation which has been happening since her first radiation. No chest pain with exertion.   No shortness of breath at rest. She does note dyspnea on exertion if she does an activity like walk around the  block or get in a hurry or if she is anxious. She does use diazepam as needed, but does not have to use frequently. No chest tightness associated with her shortness of breath. She has never had to use her nitroglycerin.   We reviewed her echocardiogram performed this morning. Showed new onset systolic and diastolic heart failure with LVEF 30-35%, gr1DD, RV normal size and function, mildly elevated PASP, mild to moderate MR/AI.   She received a notice at the end of our visit from her oncologist Dr. Janese Banks that they would be switching chemotherapy agents due to her newly reduced EF.  EKGs/Labs/Other Studies Reviewed:   The following studies were reviewed today: Echo 11/02/19 IMPRESSIONS     1. Left ventricular ejection fraction, by estimation, is 30 to 35%. The  left ventricle has moderately decreased function. The left ventricle  demonstrates global hypokinesis. Left ventricular diastolic parameters are  consistent with Grade I diastolic  dysfunction (impaired relaxation).   2. Right ventricular systolic function is normal.  The right ventricular  size is normal. There is mildly elevated pulmonary artery systolic  pressure. The estimated right ventricular systolic pressure is [id number] mmHg.   3. Mild to moderate mitral valve regurgitation.   4. Aortic valve regurgitation is mild to moderate.   EKG:  EKG is ordered today.  The ekg ordered today demonstrates NSR 85 bpm with incomplete LBBB and no acute ST/T wave changes.   Recent Labs: 10/02/2019: ALT 10 10/24/2019: BUN 33; Creatinine, Ser 1.36; Hemoglobin 12.1; Platelets 241; Potassium 5.1; Sodium 139  Recent Lipid Panel    Component Value Date/Time   CHOL 121 12/27/2018 1436   CHOL 110 04/11/2012 0835   TRIG 71 12/27/2018 1436   HDL 61 12/27/2018 1436   HDL 56 04/11/2012 0835   CHOLHDL 2.0 12/27/2018 1436   VLDL 14 12/27/2018 1436   LDLCALC 46 12/27/2018 1436   LDLCALC 41 04/11/2012 0835    Home Medications   Current Meds  Medication  Sig  . aspirin EC 81 MG tablet Take 81 mg by mouth at bedtime.  Marland Kitchen atorvastatin (LIPITOR) 10 MG tablet Take 1 tablet by mouth once daily (Patient taking differently: Take 10 mg by mouth every evening. )  . cholecalciferol (VITAMIN D3) 25 MCG (1000 UNIT) tablet Take 1,000 Units by mouth in the morning and at bedtime.  . cyanocobalamin (,VITAMIN B-12,) 1000 MCG/ML injection INJECT 1ML IM WEEKLY FOR 4 WEEKS, THEN INJECT MONTHLY THEREAFTER (Patient taking differently: Inject 1,000 mcg into the skin every 30 (thirty) days. )  . diazepam (VALIUM) 5 MG tablet Take 1 tablet (5 mg total) by mouth every 12 (twelve) hours as needed for anxiety. (Patient taking differently: Take 2.5 mg by mouth every 12 (twelve) hours as needed for anxiety. )  . DM-APAP-CPM (CORICIDIN HBP PO) Take 1 tablet by mouth daily as needed (allergies).  . DULoxetine (CYMBALTA) 30 MG capsule Take 1 capsule by mouth once daily (Patient taking differently: Take 30 mg by mouth daily. )  . esomeprazole (NEXIUM) 20 MG capsule Take 20 mg by mouth every other day.  . lidocaine-prilocaine (EMLA) cream Apply to affected area once (Patient taking differently: Apply to affected area during port access)  . metoprolol tartrate (LOPRESSOR) 25 MG tablet Take 1 tablet by mouth twice daily (Patient taking differently: Take 25 mg by mouth 2 (two) times daily. )  . nitroGLYCERIN (NITROSTAT) 0.4 MG SL tablet Place 1 tablet (0.4 mg total) under the tongue every 5 (five) minutes as needed for chest pain.  Marland Kitchen nystatin (MYCOSTATIN/NYSTOP) powder Apply 1 application topically 2 (two) times daily. To rash until resolved.  . Syringe/Needle, Disp, (SYRINGE 3CC/25GX1") 25G X 1" 3 ML MISC Use for b12 injections  . Turmeric 500 MG CAPS Take 500 mg by mouth daily.       Review of Systems       Review of Systems  Constitution: Negative for chills, fever and malaise/fatigue.  Cardiovascular: Positive for dyspnea on exertion (with more than usual activity) and leg  swelling (left leg only post lymph node removal). Negative for chest pain, near-syncope, orthopnea, palpitations and syncope.  Respiratory: Negative for cough, shortness of breath and wheezing.   Gastrointestinal: Negative for nausea and vomiting.  Neurological: Positive for light-headedness. Negative for dizziness and weakness.   All other systems reviewed and are otherwise negative except as noted above.  Physical Exam    VS:  BP 110/82 (BP Location: Left Arm, Patient Position: Sitting, Cuff Size: Normal)   Pulse 85  Ht 5' 1.75" (1.568 m)   Wt 163 lb 2 oz (74 kg)   SpO2 98%   BMI 30.08 kg/m  , BMI Body mass index is 30.08 kg/m. GEN: Well nourished, well developed, in no acute distress. HEENT: normal. Neck: Supple, no JVD, carotid bruits, or masses. Cardiac: RRR, no murmurs, rubs, or gallops. No clubbing, cyanosis, edema.  Radials/DP/PT 2+ and equal bilaterally.  Respiratory:  Respirations regular and unlabored, clear to auscultation bilaterally. GI: Soft, nontender, nondistended, BS + x 4. MS: No deformity or atrophy. Skin: Warm and dry, no rash. Neuro:  Strength and sensation are intact. Psych: Normal affect.  Assessment & Plan    1. Cardiac clearance for chemotherapy - Echo today with newly noted LVEF 30-35%. CAD with no symptoms concerning for angina. Plan for Lexiscan to rule out ischemia as origin for reduced EF though suspect it is from previous Adriamycin usage. This does not need to be performed prior to her chemotherapy. She is clear to begin chemotherapy Monday. Recommend avoidance of cardiotoxic agents. Her oncologist Dr. Janese Banks is aware of echo results and has opted to change her chemo regimen.  2. CAD - Remote stent LAD 2013 with low risk myoview 2014. No symptoms concerning for angina. Reports only mild DOE with more than usual activity such as walking around the block. EKG today no acute changes. In setting of newly reduced EF, plan for Lexiscan Myoview to rule out  ischemia as cause. This does not need to be performed prior to starting chemotherapy. This plan of care was discussed with Jaime Hall in the office today. Continue GDMT including aspirin, beta blocker, statin.   3. HLD, HLD goal <70 - Continue Atorvastatin 10mg  daily.   4. HTN - BP low normal today. Reports intermittently lightheadedness. Recommend making position changes carefully and compression stockings. Continue Metoprolol 25mg  BID.    5. HFrEF - New diagnosis reviewed in depth today. Echo as part of clearance for chemo revealed LVEF 30-35% and gr1DD. NYHA I-II with only mild DOE. No volume overload on exam. Addition of HF therapies limited by low BP and lightheadedness. Continue Toprol. Educated on low sodium diet and limiting to 2L fluid. Plan for Lexiscan Myoview to rule out ischemia as origin. Anticipate previous Adriamycin use is cause.   6. Valvular heart disease - Mild to moderate MR/AI by echo. Continue optimal BP and volume control. Anticipate monitoring with periodic echo.   Disposition: Follow up in 4 week(s) with Jaime Hall.    Loel Dubonnet, NP 11/02/2019, 10:27 PM

## 2019-11-02 NOTE — Patient Instructions (Signed)
Medication Instructions:  No medication changes today.   *If you need a refill on your cardiac medications before your next appointment, please call your pharmacy*   Lab Work: No lab work today.   Testing/Procedures: Your EKG today showed normal sinus rhythm.   Your echocardiogram showed reduced pumping function of the heart called systolic heart failure. It also showed that your mitral and aortic valves were mildly to moderately leaky.  Your physician has requested that you have a lexiscan myoview. For further information please visit [email or web address]. Please follow instruction sheet, as given.   Follow-Up: At [facility], you and your health needs are our priority.  As part of our continuing mission to provide you with exceptional heart care, we have created designated Provider Care Teams.  These Care Teams include your primary Cardiologist (physician) and Advanced Practice Providers (APPs -  Physician Assistants and Nurse Practitioners) who all work together to provide you with the care you need, when you need it.  We recommend signing up for the patient portal called "MyChart".  Sign up information is provided on this After Visit Summary.  MyChart is used to connect with patients for Virtual Visits (Telemedicine).  Patients are able to view lab/test results, encounter notes, upcoming appointments, etc.  Non-urgent messages can be sent to your provider as well.   To learn more about what you can do with MyChart, go to [email or web address].    Your next appointment:   1 month(s)  The format for your next appointment:   In Person  Provider:   You may see [name], MD or one of the following Advanced Practice Providers on your designated Care Team:    Murray Hodgkins, NP  Christell Faith, PA-C  Marrianne Mood, PA-C  Other Instructions  [place]  Your caregiver has ordered a Stress Test with nuclear imaging. The purpose of this test is to evaluate the blood  supply to your heart muscle. This procedure is referred to as a "Non-Invasive Stress Test." This is because other than having an IV started in your vein, nothing is inserted or "invades" your body. Cardiac stress tests are done to find areas of poor blood flow to the heart by determining the extent of coronary artery disease (CAD). Some patients exercise on a treadmill, which naturally increases the blood flow to your heart, while others who are  unable to walk on a treadmill due to physical limitations have a pharmacologic/chemical stress agent called Lexiscan . This medicine will mimic walking on a treadmill by temporarily increasing your coronary blood flow.   Please note: these test may take anywhere between 2-4 hours to complete  PLEASE REPORT TO [place] AT THE FIRST DESK WILL DIRECT YOU WHERE TO GO  Date of Procedure:_____________________________________  Arrival Time for Procedure:______________________________  Instructions regarding medication:   Hold betablocker(s) - your Metoprolol - the night before procedure and morning of procedure Take the rest of your medications with a small amount of water.   PLEASE NOTIFY THE OFFICE AT LEAST 47 HOURS IN ADVANCE IF YOU ARE UNABLE TO KEEP YOUR APPOINTMENT.  [phone] AND  PLEASE NOTIFY NUCLEAR MEDICINE AT [facility] AT LEAST 24 HOURS IN ADVANCE IF YOU ARE UNABLE TO KEEP YOUR APPOINTMENT. [phone]  How to prepare for your Myoview test:  1. Do not eat or drink after midnight 2. No caffeine for 24 hours prior to test 3. No smoking 24 hours prior to test. 4. Your medication may be taken with  water.  If your doctor stopped a medication because of this test, do not take that medication. 5. Ladies, please do not wear dresses.  Skirts or pants are appropriate. Please wear a short sleeve shirt. 6. No perfume, cologne or lotion. 7. Wear comfortable walking shoes. No heels!   Heart Failure, Diagnosis  Heart  failure is a condition in which the heart has trouble pumping blood because it has become weak or stiff. This means that the heart does not pump blood well enough for the body to stay healthy. For some people with heart failure, fluid may back up into the lungs. There may also be swelling (edema) in the lower legs. Heart failure is usually a long-term (chronic) condition. It is important for you to take good care of yourself and follow the treatment plan from your health care provider. What are the causes? This condition may be caused by:  High blood pressure (hypertension). Hypertension causes the heart muscle to work harder than normal. This makes the heart stiff or weak.  Coronary artery disease, or CAD. CAD is the buildup of cholesterol and fat (plaque) in the arteries of the heart.  Heart attack, also called myocardial infarction. This injures the heart muscle, making it hard for the heart to pump blood.  Abnormal heart valves. The valves do not open and close properly, forcing the heart to pump harder to keep the blood flowing.  Heart muscle disease (cardiomyopathy or myocarditis). This is damage to the heart muscle. It can increase the risk of heart failure.  Lung disease. The heart works harder when the lungs are not healthy.  Abnormal heart rhythms. These can lead to heart failure. What increases the risk? The risk of heart failure increases as a person ages. This condition is also more likely to develop in people who:  Are overweight.  Are female.  Smoke or chew tobacco.  Abuse alcohol or illegal drugs.  Have taken medicines that can damage the heart, such as chemotherapy drugs.  Have diabetes.  Have abnormal heart rhythms.  Have thyroid problems.  Have low blood counts (anemia). What are the signs or symptoms? Symptoms of this condition include:  Shortness of breath with activity, such as when climbing stairs.  A cough that does not go away.  Swelling of the feet,  ankles, legs, or abdomen.  Losing weight for no reason.  Trouble breathing when lying flat (orthopnea).  Waking from sleep because of the need to sit up and get more air.  Rapid heartbeat.  Tiredness (fatigue) and loss of energy.  Feeling light-headed, dizzy, or close to fainting.  Loss of appetite.  Nausea.  Waking up more often during the night to urinate (nocturia).  Confusion. How is this diagnosed? This condition is diagnosed based on:  Your medical history, symptoms, and a physical exam.  Diagnostic tests, which may include: ? Echocardiogram. ? Electrocardiogram (ECG). ? Chest X-ray. ? Blood tests. ? Exercise stress test. ? Radionuclide scans. ? Cardiac catheterization and angiogram. How is this treated? Treatment for this condition is aimed at managing the symptoms of heart failure. Medicines Treatment may include medicines that:  Help lower blood pressure by relaxing (dilating) the blood vessels. These medicines are called ACE inhibitors (angiotensin-converting enzyme) and ARBs (angiotensin receptor blockers).  Cause the kidneys to remove salt and water from the blood through urination (diuretics).  Improve heart muscle strength and prevent the heart from beating too fast (beta blockers).  Increase the force of the heartbeat (digoxin). Healthy behavior  changes     Treatment may also include making healthy lifestyle changes, such as:  Reaching and staying at a healthy weight.  Quitting smoking or chewing tobacco.  Eating heart-healthy foods.  Limiting or avoiding alcohol.  Stopping the use of illegal drugs.  Being physically active.  Other treatments Other treatments may include:  Procedures to open blocked arteries or repair damaged valves.  Placing a pacemaker to improve heart function (cardiac resynchronization therapy).  Placing a device to treat serious abnormal heart rhythms (implantable cardioverter defibrillator, or  ICD).  Placing a device to improve the pumping ability of the heart (left ventricular assist device, or LVAD).  Receiving a healthy heart from a donor (heart transplant). This is done when other treatments have not helped. Follow these instructions at home:  Manage other health conditions as told by your health care provider. These may include hypertension, diabetes, thyroid disease, or abnormal heart rhythms.  Get ongoing education and support as needed. Learn as much as you can about heart failure.  Keep all follow-up visits as told by your health care provider. This is important. Summary  Heart failure is a condition in which the heart has trouble pumping blood because it has become weak or stiff.  This condition is caused by high blood pressure and other diseases of the heart and lungs.  Symptoms of this condition include shortness of breath, tiredness (fatigue), nausea, and swelling of the feet, ankles, legs, or abdomen.  Treatments for this condition may include medicines, lifestyle changes, and surgery.  Manage other health conditions as told by your health care provider. This information is not intended to replace advice given to you by your health care provider. Make sure you discuss any questions you have with your health care provider. Document Revised: 08/19/2018 Document Reviewed: 08/19/2018 Elsevier Patient Education  [place].

## 2019-11-05 ENCOUNTER — Other Ambulatory Visit: Payer: Self-pay | Admitting: *Deleted

## 2019-11-05 DIAGNOSIS — C541 Malignant neoplasm of endometrium: Secondary | ICD-10-CM

## 2019-11-05 DIAGNOSIS — C786 Secondary malignant neoplasm of retroperitoneum and peritoneum: Secondary | ICD-10-CM

## 2019-11-06 ENCOUNTER — Inpatient Hospital Stay: Payer: PPO

## 2019-11-06 ENCOUNTER — Other Ambulatory Visit: Payer: PPO

## 2019-11-06 ENCOUNTER — Inpatient Hospital Stay (HOSPITAL_BASED_OUTPATIENT_CLINIC_OR_DEPARTMENT_OTHER): Payer: PPO | Admitting: Oncology

## 2019-11-06 ENCOUNTER — Encounter: Payer: Self-pay | Admitting: Oncology

## 2019-11-06 ENCOUNTER — Other Ambulatory Visit: Payer: Self-pay

## 2019-11-06 VITALS — BP 112/68 | HR 70 | Temp 98.1°F | Resp 16 | Ht 61.75 in | Wt 164.9 lb

## 2019-11-06 DIAGNOSIS — C541 Malignant neoplasm of endometrium: Secondary | ICD-10-CM | POA: Diagnosis not present

## 2019-11-06 DIAGNOSIS — G63 Polyneuropathy in diseases classified elsewhere: Secondary | ICD-10-CM | POA: Diagnosis not present

## 2019-11-06 DIAGNOSIS — C786 Secondary malignant neoplasm of retroperitoneum and peritoneum: Secondary | ICD-10-CM

## 2019-11-06 DIAGNOSIS — C8590 Non-Hodgkin lymphoma, unspecified, unspecified site: Secondary | ICD-10-CM | POA: Diagnosis not present

## 2019-11-06 DIAGNOSIS — Z5111 Encounter for antineoplastic chemotherapy: Secondary | ICD-10-CM

## 2019-11-06 DIAGNOSIS — Z7189 Other specified counseling: Secondary | ICD-10-CM | POA: Diagnosis not present

## 2019-11-06 DIAGNOSIS — Z95828 Presence of other vascular implants and grafts: Secondary | ICD-10-CM

## 2019-11-06 LAB — COMPREHENSIVE METABOLIC PANEL
ALT: 12 U/L (ref 0–44)
AST: 23 U/L (ref 15–41)
Albumin: 3.5 g/dL (ref 3.5–5.0)
Alkaline Phosphatase: 100 U/L (ref 38–126)
Anion gap: 8 (ref 5–15)
BUN: 21 mg/dL (ref 8–23)
CO2: 26 mmol/L (ref 22–32)
Calcium: 9 mg/dL (ref 8.9–10.3)
Chloride: 105 mmol/L (ref 98–111)
Creatinine, Ser: 1.35 mg/dL — ABNORMAL HIGH (ref 0.44–1.00)
GFR calc Af Amer: 46 mL/min — ABNORMAL LOW (ref >60)
GFR calc non Af Amer: 40 mL/min — ABNORMAL LOW (ref >60)
Glucose, Bld: 131 mg/dL — ABNORMAL HIGH (ref 70–99)
Potassium: 4.1 mmol/L (ref 3.5–5.1)
Sodium: 139 mmol/L (ref 135–145)
Total Bilirubin: 1 mg/dL (ref 0.3–1.2)
Total Protein: 6.6 g/dL (ref 6.5–8.1)

## 2019-11-06 LAB — CBC WITH DIFFERENTIAL/PLATELET
Abs Immature Granulocytes: 0.05 10*3/uL (ref 0.00–0.07)
Basophils Absolute: 0.1 10*3/uL (ref 0.0–0.1)
Basophils Relative: 0 %
Eosinophils Absolute: 0.1 10*3/uL (ref 0.0–0.5)
Eosinophils Relative: 1 %
HCT: 34.4 % — ABNORMAL LOW (ref 36.0–46.0)
Hemoglobin: 11.2 g/dL — ABNORMAL LOW (ref 12.0–15.0)
Immature Granulocytes: 0 %
Lymphocytes Relative: 6 %
Lymphs Abs: 0.7 10*3/uL (ref 0.7–4.0)
MCH: 28.7 pg (ref 26.0–34.0)
MCHC: 32.6 g/dL (ref 30.0–36.0)
MCV: 88.2 fL (ref 80.0–100.0)
Monocytes Absolute: 0.6 10*3/uL (ref 0.1–1.0)
Monocytes Relative: 5 %
Neutro Abs: 9.8 10*3/uL — ABNORMAL HIGH (ref 1.7–7.7)
Neutrophils Relative %: 88 %
Platelets: 257 10*3/uL (ref 150–400)
RBC: 3.9 MIL/uL (ref 3.87–5.11)
RDW: 13.6 % (ref 11.5–15.5)
WBC: 11.4 10*3/uL — ABNORMAL HIGH (ref 4.0–10.5)
nRBC: 0 % (ref 0.0–0.2)

## 2019-11-06 MED ORDER — SODIUM CHLORIDE 0.9 % IV SOLN
10.0000 mg | Freq: Once | INTRAVENOUS | Status: AC
  Administered 2019-11-06: 10 mg via INTRAVENOUS
  Filled 2019-11-06: qty 10

## 2019-11-06 MED ORDER — SODIUM CHLORIDE 0.9% FLUSH
10.0000 mL | INTRAVENOUS | Status: DC | PRN
  Administered 2019-11-06: 10 mL via INTRAVENOUS
  Filled 2019-11-06: qty 10

## 2019-11-06 MED ORDER — SODIUM CHLORIDE 0.9 % IV SOLN
1400.0000 mg | Freq: Once | INTRAVENOUS | Status: AC
  Administered 2019-11-06: 1400 mg via INTRAVENOUS
  Filled 2019-11-06: qty 26.3

## 2019-11-06 MED ORDER — SODIUM CHLORIDE 0.9 % IV SOLN
INTRAVENOUS | Status: DC
  Filled 2019-11-06: qty 250

## 2019-11-06 MED ORDER — SODIUM CHLORIDE 0.9 % IV SOLN
141.0000 mg | Freq: Once | INTRAVENOUS | Status: AC
  Administered 2019-11-06: 140 mg via INTRAVENOUS
  Filled 2019-11-06: qty 14

## 2019-11-06 MED ORDER — HEPARIN SOD (PORK) LOCK FLUSH 100 UNIT/ML IV SOLN
500.0000 [IU] | Freq: Once | INTRAVENOUS | Status: AC | PRN
  Administered 2019-11-06: 500 [IU]
  Filled 2019-11-06: qty 5

## 2019-11-06 NOTE — Progress Notes (Signed)
Pharmacist Chemotherapy Monitoring - Follow Up Assessment    I verify that I have reviewed each item in the below checklist:  . Regimen for the patient is scheduled for the appropriate day and plan matches scheduled date. Marland Kitchen Appropriate non-routine labs are ordered dependent on drug ordered. . If applicable, additional medications reviewed and ordered per protocol based on lifetime cumulative doses and/or treatment regimen.   Plan for follow-up and/or issues identified: No . I-vent associated with next due treatment: No . MD and/or nursing notified: No  Jerrilynn Mikowski K 11/06/2019 9:13 AM

## 2019-11-06 NOTE — Progress Notes (Signed)
Pt here for 1st tx for cancer. Port is sore.

## 2019-11-07 LAB — CA 125: Cancer Antigen (CA) 125: 127 U/mL — ABNORMAL HIGH (ref 0.0–38.1)

## 2019-11-08 ENCOUNTER — Telehealth: Payer: Self-pay | Admitting: *Deleted

## 2019-11-08 ENCOUNTER — Encounter: Payer: Self-pay | Admitting: *Deleted

## 2019-11-08 NOTE — Progress Notes (Signed)
Hematology/Oncology Consult note [facility]  Telephone:([phone] [phone]  Patient Care Team: [name], MD as PCP - General (Internal Medicine) Wellington Hampshire, MD as PCP - Cardiology (Cardiology) [name], MD as Consulting Physician (General Surgery) Mellody Drown, MD as Referring Physician (Obstetrics and Gynecology) [name], MD as Referring Physician (Obstetrics and Gynecology) Hollice Espy, MD as Consulting Physician (Urology) [name], RN as Registered Nurse Sindy Guadeloupe, MD as Consulting Physician (Oncology)   Name of the patient: Jaime Hall  [id number]  [birth year]   Date of visit: 11/08/19  Diagnosis- 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  She underwent robotic hysterectomy with bilateral salpingo-oophorectomy with washings and sentinel lymph node injection mapping and biopsy in February 2018.  She had multiple postoperative complications and completed 3 cycles of carbotaxol chemotherapy in May 2018.  Treatment was complicated by chemo-induced peripheral neuropathy for which Taxol dose was reduced by 25%.  She completed 5 weeks of external beam whole pelvic radiation on August 2018.  Her original tumor in 2018 was ER +1%.  PR negative.  MSI stable and HER-2 negative  She was under clinical surveillance and recently presented to [name] with symptoms of right lower quadrant abdominal pain which led to a CT abdomen which showed new peritoneal metastatic disease throughout the right abdomen and pelvis with index masses measuring 5.7 x 4.7 cm.  Head  CT scan also showed evidence of hypermetabolic abdominal pelvic peritoneal metastases but no evidence of other sites of metastases.  Repeat omental biopsy confirms high grade serous carcinoma  Interval history- Patient denies any significant abdominal pain at this time. Bowel movements are regular  ECOG PS- 1 Pain scale- 0  Review of systems- Review of Systems  Constitutional: Negative for chills, fever, malaise/fatigue and weight loss.  HENT: Negative for congestion, ear discharge and nosebleeds.   Eyes: Negative for blurred vision.  Respiratory: Negative for cough, hemoptysis, sputum production, shortness of breath and wheezing.   Cardiovascular: Negative for chest pain, palpitations, orthopnea and claudication.  Gastrointestinal: Negative for abdominal pain, blood in stool, constipation, diarrhea, heartburn, melena, nausea and vomiting.  Genitourinary: Negative for dysuria, flank pain, frequency, hematuria and urgency.  Musculoskeletal: Negative for back pain, joint pain and myalgias.  Skin: Negative for rash.  Neurological: Negative for dizziness, tingling, focal weakness, seizures, weakness and headaches.  Endo/Heme/Allergies: Does not bruise/bleed easily.  Psychiatric/Behavioral: Negative for depression and suicidal ideas. The patient does not have insomnia.       Allergies  Allergen Reactions  . Adhesive [Tape] Other (See Comments)    Burns the skin  . Z-Pak [Azithromycin] Itching  . Antifungal [Miconazole Nitrate] Rash  . Sulfa Antibiotics Nausea And Vomiting and Rash    N/T     Past Medical History:  Diagnosis Date  . Anxiety   . Cervicalgia   . Coronary artery disease    a. 02/2012 Stress echo: severe anterior wall ischemia;  b. 02/2012 Cath/PCI: LAD 95p (3.0 x 15 Xience EX DES), D1 90ost (PTCA - bifurcational dzs), EF 45% with anterior HK;  b. 02/2013 Ex MV: fixed anterior defect w/ minor reversibility, nl EF-->Med Rx.  . Endometrial  cancer ([place])    a. 07/2016 s/p  robotic hysterectomy, BSO w/ washings, sentinel node inj, mapping, bx, adhesiolysis.  . Essential hypertension, benign   . Fibrocystic breast disease   . GERD (gastroesophageal reflux disease)   . Gestational hypertension   . Heart murmur   . History of anemia   . History of blood transfusion   . Hodgkin's lymphoma ([place]) 2011   a. s/p radiation and chemo therapy  . Osteoarthritis   . Polycystic ovarian disease      Past Surgical History:  Procedure Laterality Date  . ABDOMINAL HYSTERECTOMY    . bladder sling    . CARDIAC CATHETERIZATION  02/2012   [facility] 1 stent place  . CERVICAL POLYPECTOMY    . CHOLECYSTECTOMY  1982  . COLONOSCOPY WITH PROPOFOL N/A 02/05/2015   Procedure: COLONOSCOPY WITH PROPOFOL;  Surgeon: Lucilla Lame, MD;  Location: [facility] ENDOSCOPY;  Service: Endoscopy;  Laterality: N/A;  . CORONARY ANGIOPLASTY  02/2012   left/right s/p balloon  . CYSTOGRAM N/A 08/17/2016   Procedure: CYSTOGRAM;  Surgeon: Hollice Espy, MD;  Location: [facility] ORS;  Service: Urology;  Laterality: N/A;  . CYSTOSCOPY N/A 08/17/2016   Procedure: CYSTOSCOPY EXAM UNDER ANESTHESIA;  Surgeon: Hollice Espy, MD;  Location: [facility] ORS;  Service: Urology;  Laterality: N/A;  . CYSTOSCOPY W/ RETROGRADES Bilateral 08/17/2016   Procedure: CYSTOSCOPY WITH RETROGRADE PYELOGRAM;  Surgeon: Hollice Espy, MD;  Location: [facility] ORS;  Service: Urology;  Laterality: Bilateral;  . CYSTOSCOPY WITH STENT PLACEMENT Right 08/17/2016   Procedure: CYSTOSCOPY WITH STENT PLACEMENT;  Surgeon: Hollice Espy, MD;  Location: [facility] ORS;  Service: Urology;  Laterality: Right;  . heart stent'  2013  . kidney stent Right 2018  . LYMPH NODE BIOPSY  2011   diagnosis of hodgkins lymphoma  . PELVIC LYMPH NODE DISSECTION N/A 07/29/2016   Procedure: PELVIC/AORTIC LYMPH NODE SAMPLING;  Surgeon: [name], MD;  Location: [facility] ORS;  Service: Gynecology;  Laterality: N/A;  . PORTA CATH INSERTION N/A 09/22/2016   Procedure: Glori Luis Cath  Insertion;  Surgeon: [name], MD;  Location: [place] [facility];  Service: Cardiovascular;  Laterality: N/A;  . PORTA CATH INSERTION N/A 10/27/2019   Procedure: PORTA CATH INSERTION;  Surgeon: [name], MD;  Location: [place] [facility];  Service: Cardiovascular;  Laterality: N/A;  . PORTA CATH REMOVAL N/A 11/17/2016   Procedure: Glori Luis Cath Removal;  Surgeon: [name], MD;  Location: [place] [facility];  Service: Cardiovascular;  Laterality: N/A;  . ROBOTIC ASSISTED TOTAL HYSTERECTOMY WITH BILATERAL SALPINGO OOPHERECTOMY N/A 07/29/2016   Procedure: ROBOTIC ASSISTED TOTAL HYSTERECTOMY WITH BILATERAL SALPINGO OOPHORECTOMY;  Surgeon: [name], MD;  Location: [facility] OR[facility];  Service: Gynecology;  Laterality: N/A;  . SENTINEL NODE BIOPSY N/A 07/29/2016   Procedure: SENTINEL NODE BIOPSY;  Surgeon: [name], MD;  Location: [facility] OR[facility];  Service: Gynecology;  Laterality: N/A;  . transobturator sling N/A 2009   [place]    Social History   Socioeconomic History  . Marital status: Widowed    Spouse name: Not on file  . Number of children: Not on file  . Years of education: Not on file  . Highest education level: Not on file  Occupational History  . Not on file  Tobacco Use  . Smoking status: Former Smoker    Packs/day: 1.00    Years: 30.00    Pack years: 30.00    Types: Cigarettes    Quit date: 07/24/2002  Years since quitting: 17.3  . Smokeless tobacco: Never Used  . Tobacco comment: quit smoking in 2000  Substance and Sexual Activity  . Alcohol use: Not Currently  . Drug use: No  . Sexual activity: Never  Other Topics Concern  . 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    Social Determinants of Health   Financial Resource Strain:   . Difficulty of Paying Living Expenses:   Food Insecurity:   . Worried About Charity fundraiser in the Last  Year:   . Arboriculturist in the Last Year:   Transportation Needs:   . Film/video editor (Medical):   Marland Kitchen Lack of Transportation (Non-Medical):   Physical Activity:   . Days of Exercise per Week:   . Minutes of Exercise per Session:   Stress:   . Feeling of Stress :   Social Connections:   . Frequency of Communication with Friends and Family:   . Frequency of Social Gatherings with Friends and Family:   . Attends Religious Services:   . Active Member of Clubs or Organizations:   . Attends Archivist Meetings:   Marland Kitchen Marital Status:   Intimate Partner Violence:   . Fear of Current or Ex-Partner:   . Emotionally Abused:   Marland Kitchen Physically Abused:   . Sexually Abused:     Family History  Problem Relation Age of Onset  . ALS Father   . Polymyositis Father   . Diabetes Brother   . Cancer Maternal Aunt        breast  . Breast cancer Maternal Aunt        30's  . Stroke Maternal Grandmother   . Cancer Maternal Grandfather        prostate  . Stroke Maternal Grandfather   . Colon cancer Maternal Aunt   . Non-Hodgkin's lymphoma Cousin      Current Outpatient Medications:  .  aspirin EC 81 MG tablet, Take 81 mg by mouth at bedtime., Disp: , Rfl:  .  atorvastatin (LIPITOR) 10 MG tablet, Take 1 tablet by mouth once daily (Patient taking differently: Take 10 mg by mouth every evening. ), Disp: 90 tablet, Rfl: 2 .  cholecalciferol (VITAMIN D3) 25 MCG (1000 UNIT) tablet, Take 1,000 Units by mouth in the morning and at bedtime., Disp: , Rfl:  .  cyanocobalamin (,VITAMIN B-12,) 1000 MCG/ML injection, INJECT 1ML IM WEEKLY FOR 4 WEEKS, THEN INJECT MONTHLY THEREAFTER (Patient taking differently: Inject 1,000 mcg into the skin every 30 (thirty) days. ), Disp: 10 mL, Rfl: 0 .  diazepam (VALIUM) 5 MG tablet, Take 1 tablet (5 mg total) by mouth every 12 (twelve) hours as needed for anxiety. (Patient taking differently: Take 2.5 mg by mouth every 12 (twelve) hours as needed for anxiety. ),  Disp: 30 tablet, Rfl: 0 .  DM-APAP-CPM (CORICIDIN HBP PO), Take 1 tablet by mouth daily as needed (allergies)., Disp: , Rfl:  .  DULoxetine (CYMBALTA) 30 MG capsule, Take 1 capsule by mouth once daily (Patient taking differently: Take 30 mg by mouth daily. ), Disp: 90 capsule, Rfl: 0 .  lidocaine-prilocaine (EMLA) cream, Apply to affected area once (Patient taking differently: Apply to affected area during port access), Disp: 30 g, Rfl: 3 .  metoprolol tartrate (LOPRESSOR) 25 MG tablet, Take 1 tablet by mouth twice daily (Patient taking differently: Take 25 mg  by mouth 2 (two) times daily. ), Disp: 180 tablet, Rfl: 2 .  nitroGLYCERIN (NITROSTAT) 0.4 MG SL tablet, Place 1 tablet (0.4 mg total) under the tongue every 5 (five) minutes as needed for chest pain., Disp: 25 tablet, Rfl: 3 .  nystatin (MYCOSTATIN/NYSTOP) powder, Apply 1 application topically 2 (two) times daily. To rash until resolved., Disp: 30 g, Rfl: 0 .  Syringe/Needle, Disp, (SYRINGE 3CC/25GX1") 25G X 1" 3 ML MISC, Use for b12 injections, Disp: 50 each, Rfl: 0 .  esomeprazole (NEXIUM) 20 MG capsule, Take 20 mg by mouth every other day., Disp: , Rfl:  .  LORazepam (ATIVAN) 0.5 MG tablet, Take 1 tablet (0.5 mg total) by mouth every 6 (six) hours as needed (Nausea or vomiting). (Patient not taking: Reported on 11/02/2019), Disp: 30 tablet, Rfl: 0 .  ondansetron (ZOFRAN) 8 MG tablet, Take 1 tablet (8 mg total) by mouth 2 (two) times daily as needed (Nausea or vomiting). (Patient not taking: Reported on 11/02/2019), Disp: 30 tablet, Rfl: 1 .  prochlorperazine (COMPAZINE) 10 MG tablet, Take 1 tablet (10 mg total) by mouth every 6 (six) hours as needed (Nausea or vomiting). (Patient not taking: Reported on 11/02/2019), Disp: 30 tablet, Rfl: 1 .  Turmeric 500 MG CAPS, Take 500 mg by mouth daily. , Disp: , Rfl:   Physical exam:  Vitals:   11/06/19 0842  BP: 112/68  Pulse: 70  Resp: 16  Temp: 98.1 F (36.7 C)  TempSrc: Tympanic  Weight: 164  lb 14.4 oz (74.8 kg)  Height: 5' 1.75" (1.568 m)   Physical Exam Constitutional:      General: She is not in acute distress. Cardiovascular:     Rate and Rhythm: Normal rate and regular rhythm.     Heart sounds: Normal heart sounds.  Pulmonary:     Effort: Pulmonary effort is normal.     Breath sounds: Normal breath sounds.  Abdominal:     General: Bowel sounds are normal.     Palpations: Abdomen is soft.  Musculoskeletal:     Cervical back: Normal range of motion.  Skin:    General: Skin is warm and dry.  Neurological:     Mental Status: She is alert and oriented to person, place, and time.      CMP Latest Ref Rng & Units 11/06/2019  Glucose 70 - 99 mg/dL 131(H)  BUN 8 - 23 mg/dL 21  Creatinine 0.44 - 1.00 mg/dL 1.35(H)  Sodium 135 - 145 mmol/L 139  Potassium 3.5 - 5.1 mmol/L 4.1  Chloride 98 - 111 mmol/L 105  CO2 22 - 32 mmol/L 26  Calcium 8.9 - 10.3 mg/dL 9.0  Total Protein 6.5 - 8.1 g/dL 6.6  Total Bilirubin 0.3 - 1.2 mg/dL 1.0  Alkaline Phos 38 - 126 U/L 100  AST 15 - 41 U/L 23  ALT 0 - 44 U/L 12   CBC Latest Ref Rng & Units 11/06/2019  WBC 4.0 - 10.5 K/uL 11.4(H)  Hemoglobin 12.0 - 15.0 g/dL 11.2(L)  Hematocrit 36.0 - 46.0 % 34.4(L)  Platelets 150 - 400 K/uL 257    No images are attached to the encounter.  CT Abdomen Pelvis W Contrast  Result Date: 10/16/2019 CLINICAL DATA:  Right lower quadrant pain and paraumbilical pain for 2 weeks. Personal history of endometrial carcinoma and Hodgkin's lymphoma. EXAM: CT ABDOMEN AND PELVIS WITH CONTRAST TECHNIQUE: Multidetector CT imaging of the abdomen and pelvis was performed using the standard protocol following bolus administration of intravenous contrast.  CONTRAST:  49m OMNIPAQUE IOHEXOL 300 MG/ML  SOLN COMPARISON:  08/30/2017 FINDINGS: Lower Chest: No acute findings. Hepatobiliary: Multiple small hypovascular masses are seen along the capsular surface of the liver, mostly along the posterior right hepatic lobe. Largest  index lesion measures 2.7 x 1.4 cm on image 20/2. No definite intrahepatic masses identified. Prior cholecystectomy. No evidence of biliary obstruction. Pancreas:  No mass or inflammatory changes. Spleen: Within normal limits in size and appearance. Adrenals/Urinary Tract: No masses identified. Several small right renal cyst noted. No evidence of ureteral calculi or hydronephrosis. Stomach/Bowel: No evidence of obstruction, inflammatory process or abnormal fluid collections. Diverticulosis is seen mainly involving the sigmoid colon, however there is no evidence of diverticulitis. Vascular/Lymphatic: No pathologically enlarged lymph nodes. No abdominal aortic aneurysm. Aortic atherosclerosis incidentally noted. Reproductive: Prior hysterectomy. Mild peritoneal soft tissue thickening seen in the right adnexa. Other: Multiple peritoneal soft tissue masses and nodules are seen, mainly in the right abdomen and pelvis. These are new since previous study. Largest index mass is located in the anterior right abdomen just deep to the abdominal wall muscles, measuring 5.7 x 4.7 cm on image 51/2. Another index mass in the right lower quadrant mesentery measures 5.0 x 3.5 cm Musculoskeletal: No suspicious bone lesions identified. Old bilateral sacral insufficiency fractures again noted. IMPRESSION: New peritoneal metastatic disease throughout the right abdomen and pelvis, as described above. These results will be called to the ordering clinician or representative by the Radiologist Assistant, and communication documented in the PACS or [employer] Electronically Signed   By: JMarlaine HindM.D.   On: 10/16/2019 11:47   PERIPHERAL VASCULAR CATHETERIZATION  Result Date: 10/27/2019 See op note  NM PET Image Restag (PS) Skull Base To Thigh  Result Date: 10/18/2019 CLINICAL DATA:  Subsequent treatment strategy for endometrial cancer with peritoneal metastasis. Left upper extremity COVID-19 vaccine 09/12/2019. Prior history  of Hodgkin's lymphoma. EXAM: NUCLEAR MEDICINE PET SKULL BASE TO THIGH TECHNIQUE: 8.4 mCi F-18 FDG was injected intravenously. Full-ring PET imaging was performed from the skull base to thigh after the radiotracer. CT data was obtained and used for attenuation correction and anatomic localization. Fasting blood glucose: 65 mg/dl COMPARISON:  10/16/2019 abdominopelvic CT. Most recent PET 10/06/2017. FINDINGS: Mediastinal blood pool activity: SUV max 2.9 Liver activity: SUV max NA NECK: No areas of abnormal hypermetabolism. Incidental CT findings: Left carotid atherosclerosis. No cervical adenopathy. CHEST: Low-level hypermetabolism corresponding to chronic paramediastinal radiation induced fibrosis. No focal pulmonary parenchymal or thoracic nodal hypermetabolism. Incidental CT findings: Aortic atherosclerosis. Proximal LAD stent. 4 mm right middle lobe pulmonary nodule on 90/3 is similar on the 2019 PET and can be presumed benign. ABDOMEN/PELVIS: Extensive peritoneal disease along the hepatic capsule. Lesion along the falciform ligament measures 1.4 cm and a S.U.V. max of 20.9 on 122/3. Posterior right hepatic lobe capsular disease measures a S.U.V. max of 14.1 on 122/3. Right abdominopelvic peritoneal and omental metastasis, as on CT. Index upper pelvic implant measures 5.4 x 4.5 cm and a S.U.V. max of 19.3 on 171/3. A focus of hypermetabolism along the vaginal cuff is slightly eccentric left, likely corresponding to subtle hyperenhancement on image 75/2 of the 10/16/2019 CT. Example at a S.U.V. max of 12.6 on approximately image 214/3 today. Incidental CT findings: Deferred to CT of 2 days prior. SKELETON: Mildly heterogeneous marrow activity within the thoracic spine, most likely related to prior radiation. No convincing evidence of hypermetabolic osseous metastasis. Incidental CT findings: Sacral insufficiency fractures are chronic. IMPRESSION: 1. Hypermetabolic abdominopelvic  peritoneal metastasis, as detailed  above. 2. No evidence of hypermetabolic supradiaphragmatic disease. Electronically Signed   By: Abigail Miyamoto M.D.   On: 10/18/2019 16:04   CT Biopsy  Result Date: 10/24/2019 CLINICAL DATA:  History of endometrial carcinoma development multiple peritoneal masses suspicious for recurrent metastatic disease. EXAM: CT GUIDED CORE BIOPSY OF PERITONEAL MASS ANESTHESIA/SEDATION: 1.0 mg IV Versed; 50 mcg IV Fentanyl Total Moderate Sedation Time: 14 minutes. The patient's level of consciousness and physiologic status were continuously monitored during the procedure by Radiology nursing. PROCEDURE: The procedure risks, benefits, and alternatives were explained to the patient. Questions regarding the procedure were encouraged and answered. The patient understands and consents to the procedure. A time-out was performed prior to initiating the procedure. The right abdominal wall was prepped with chlorhexidine in a sterile fashion, and a sterile drape was applied covering the operative field. A sterile gown and sterile gloves were used for the procedure. Local anesthesia was provided with 1% Lidocaine. CT was performed in a supine position through the abdomen. Under CT guidance, 17 gauge trocar needle was advanced into the right anterior peritoneal cavity to the level of a peritoneal mass. After confirming needle tip position, coaxial 18 gauge core biopsy samples were obtained. Core biopsy samples were submitted in formalin. Additional CT was performed after needle removal. COMPLICATIONS: None FINDINGS: The largest peritoneal mass in the right anterior lower peritoneal cavity was targeted measuring roughly 5.8 cm in greatest diameter. Solid tissue was obtained. IMPRESSION: CT-guided core biopsy performed of a peritoneal mass in the right peritoneal cavity. Electronically Signed   By: Aletta Edouard M.D.   On: 10/24/2019 12:55   ECHOCARDIOGRAM COMPLETE  Result Date: 11/02/2019    ECHOCARDIOGRAM REPORT   Patient Name:    HUNTLEIGH DOOLEN Date of Exam: 11/02/2019 Medical Rec #:  [id number]         Height:       62.0 in Accession #:    [id number]        Weight:       163.8 lb Date of Birth:  1950/11/01         BSA:          1.756 m Patient Age:    3 years          BP:           112/50 mmHg Patient Gender: F                 HR:           87 bpm. Exam Location:  [facility] Procedure: 2D Echo, Color Doppler and Cardiac Doppler Indications:     v58.11 Chemotherapy evaluation  History:         Patient has no prior history of Echocardiogram examinations.                  CAD; Risk Factors:Hypertension, Dyslipidemia and Former Smoker.  Sonographer:     Charmayne Sheer RDCS (AE) Referring Phys:  [id number] Weston Anna Katricia Prehn Diagnosing Phys: Ida Rogue MD IMPRESSIONS  1. Left ventricular ejection fraction, by estimation, is 30 to 35%. The left ventricle has moderately decreased function. The left ventricle demonstrates global hypokinesis. Left ventricular diastolic parameters are consistent with Grade I diastolic dysfunction (impaired relaxation).  2. Right ventricular systolic function is normal. The right ventricular size is normal. There is mildly elevated pulmonary artery systolic pressure. The estimated right ventricular systolic pressure is 81.4 mmHg.  3. Mild to moderate mitral valve  regurgitation.  4. Aortic valve regurgitation is mild to moderate. FINDINGS  Left Ventricle: Left ventricular ejection fraction, by estimation, is 30 to 35%. The left ventricle has moderately decreased function. The left ventricle demonstrates global hypokinesis. The left ventricular internal cavity size was normal in size. There is no left ventricular hypertrophy. Left ventricular diastolic parameters are consistent with Grade I diastolic dysfunction (impaired relaxation). Right Ventricle: The right ventricular size is normal. No increase in right ventricular wall thickness. Right ventricular systolic function is normal. There is mildly elevated pulmonary artery  systolic pressure. The tricuspid regurgitant velocity is 2.57  m/s, and with an assumed right atrial pressure of 10 mmHg, the estimated right ventricular systolic pressure is 37.8 mmHg. Left Atrium: Left atrial size was normal in size. Right [facility]: Right atrial size was normal in size. Pericardium: There is no evidence of pericardial effusion. Mitral Valve: The mitral valve is normal in structure. Normal mobility of the mitral valve leaflets. Mild to moderate mitral valve regurgitation. No evidence of mitral valve stenosis. MV peak gradient, 6.6 mmHg. The mean mitral valve gradient is 4.0 mmHg. Tricuspid Valve: The tricuspid valve is normal in structure. Tricuspid valve regurgitation is mild . No evidence of tricuspid stenosis. Aortic Valve: The aortic valve was not well visualized. Aortic valve regurgitation is mild to moderate. Aortic regurgitation PHT measures 369 msec. No aortic stenosis is present. Aortic valve mean gradient measures 8.0 mmHg. Aortic valve peak gradient measures 14.7 mmHg. Aortic valve area, by VTI measures 1.55 cm. Pulmonic Valve: The pulmonic valve was normal in structure. Pulmonic valve regurgitation is not visualized. No evidence of pulmonic stenosis. Aorta: The aortic root is normal in size and structure. Venous: The inferior vena cava is normal in size with greater than 50% respiratory variability, suggesting right atrial pressure of 3 mmHg. IAS/Shunts: No atrial level shunt detected by color flow Doppler.  LEFT VENTRICLE PLAX 2D LVIDd:         4.67 cm      Diastology LVIDs:         4.14 cm      LV e' lateral:   7.94 cm/s LV PW:         1.04 cm      LV E/e' lateral: 13.4 LV IVS:        0.84 cm      LV e' medial:    4.90 cm/s LVOT diam:     1.70 cm      LV E/e' medial:  21.6 LV SV:         58 LV SV Index:   33 LVOT Area:     2.27 cm  LV Volumes (MOD) LV vol d, MOD A2C: 120.0 ml LV vol d, MOD A4C: 118.0 ml LV vol s, MOD A2C: 59.8 ml LV vol s, MOD A4C: 60.1 ml LV SV MOD A2C:     60.2 ml  LV SV MOD A4C:     118.0 ml LV SV MOD BP:      61.0 ml RIGHT VENTRICLE RV Basal diam:  3.07 cm TAPSE (M-mode): 2.4 cm LEFT ATRIUM             Index       RIGHT ATRIUM          Index LA diam:        3.40 cm 1.94 cm/m  RA Area:     9.19 cm LA Vol (A2C):   56.8 ml 32.34 ml/m RA Volume:   17.50 ml  9.96 ml/m LA Vol (A4C):   33.8 ml 19.25 ml/m LA Biplane Vol: 46.2 ml 26.31 ml/m  AORTIC VALVE                    PULMONIC VALVE AV Area (Vmax):    1.42 cm     PV Vmax:       0.71 m/s AV Area (Vmean):   1.47 cm     PV Vmean:      50.500 cm/s AV Area (VTI):     1.55 cm     PV VTI:        0.131 m AV Vmax:           192.00 cm/s  PV Peak grad:  2.0 mmHg AV Vmean:          130.000 cm/s PV Mean grad:  1.0 mmHg AV VTI:            0.372 m AV Peak Grad:      14.7 mmHg AV Mean Grad:      8.0 mmHg LVOT Vmax:         120.00 cm/s LVOT Vmean:        84.000 cm/s LVOT VTI:          0.254 m LVOT/AV VTI ratio: 0.68 AI PHT:            369 msec  AORTA Ao Root diam: 2.40 cm MITRAL VALVE                TRICUSPID VALVE MV Area (PHT): 5.34 cm     TR Peak grad:   26.4 mmHg MV Peak grad:  6.6 mmHg     TR Vmax:        257.00 cm/s MV Mean grad:  4.0 mmHg MV Vmax:       1.28 m/s     SHUNTS MV Vmean:      102.0 cm/s   Systemic VTI:  0.25 m MV Decel Time: 142 msec     Systemic Diam: 1.70 cm MV E velocity: 106.00 cm/s MV A velocity: 116.00 cm/s MV E/A ratio:  0.91 Ida Rogue MD Electronically signed by Ida Rogue MD Signature Date/Time: 11/02/2019/1:55:04 PM    Final      Assessment and plan- Patient is a 69 y.o. 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  She has platinum sensitive disease and is here for on treatment assessment prior to cycle 1 of carboplatin and gemcitabine  Patient received carbotaxol chemotherapy.  2019 and had good response to treatment.  She now has biopsy-proven recurrent disease and is platinum sensitive.  However she has pre-existing neuropathy  which worsened after getting Taxol and therefore she is unable to receive more Taxol at this time.  Patient was seen by GYN oncology who recommended carboplatin and Doxil.  However patient has had prior history of diffuse large B-cell lymphoma and on looking at her prior records she has received close to 350 mg per metered squared cumulative dose of doxorubicin.  Also her baseline echocardiogram shows an EF of 30 to 35%.  I am therefore unable to give her any further anthracycline at this time.  She is undergoing further work-up for her low EF with cardiology.  However cardiology is okay with me proceeding with nonanthracycline chemotherapy.  Patient does not have any significant cardiac symptoms such as shortness of breath chest pain or leg swelling.  Counts are otherwise okay to proceed with  cycle 1 of carboplatin and gemcitabine today.  She will proceed with cycle 1 day 8 next week and will receive growth factor support.  I plan to give her 2 weeks on 1 week off treatment.  I will see her back in 3 weeks time for cycle 2-day 1 of carboplatin and gemcitabine.  Patient understands the treatment is being given with a palliative intent and until progression or toxicity   Visit Diagnosis 1. Encounter for antineoplastic chemotherapy   2. 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 Goals of care, counseling/discussion   4. Peritoneal metastases ([place])   5. Endometrial cancer [facility])      Dr. Randa Evens, MD, MPH [facility] [phone] 11/08/2019 9:28 AM

## 2019-11-08 NOTE — Telephone Encounter (Signed)
I called the pt and she said tooth was broke in the back of her mouth. She knows she will need a letter from dentist to get work done while on chemo and the fax # [phone]. The pt said she thought she may need atb because of having portacath. I told her that it was done a long time ago but not now but I would check. Dr. Janese Banks says to do letter for dentist to fix tooth, and if dentist wants atb from her standpoint then she can order it. I have faxed the letter to her dentist [employer]. Pt aware of this

## 2019-11-08 NOTE — Telephone Encounter (Signed)
Patient called reporting that she broke her tooth and that she needs dental clearance in writing so that she can get it repaired

## 2019-11-09 ENCOUNTER — Telehealth: Payer: Self-pay | Admitting: *Deleted

## 2019-11-09 NOTE — Telephone Encounter (Signed)
Ok to take ibuprofen for few days. I dont need to give her antibiotics from my side. However, if the dentist wants to give it to her, that would be their call

## 2019-11-09 NOTE — Telephone Encounter (Signed)
Call returned to patient and advised that per Dr Janese Banks ok to take ibuprofen for a few days. I asked if the dentist put her on antibiotics and she said no and I told her that Dr Janese Banks said she did not need them from her aspect and that it was up to dentist to decide if she needed them. She thanked me for calling her back

## 2019-11-09 NOTE — Telephone Encounter (Signed)
Patient called reporting that she had her tooth pulled yesterday and that it was abscessed. She states her dentist gave her prescription for ibuprofen 800 mg and is asking if it is alright to take this. She did not say if antibiotics were ordered for the infection or not. Please advise

## 2019-11-10 DIAGNOSIS — C541 Malignant neoplasm of endometrium: Secondary | ICD-10-CM | POA: Diagnosis not present

## 2019-11-14 ENCOUNTER — Other Ambulatory Visit: Payer: Self-pay | Admitting: Oncology

## 2019-11-14 ENCOUNTER — Other Ambulatory Visit: Payer: Self-pay

## 2019-11-14 ENCOUNTER — Inpatient Hospital Stay: Payer: PPO | Attending: Oncology

## 2019-11-14 ENCOUNTER — Inpatient Hospital Stay: Payer: PPO

## 2019-11-14 ENCOUNTER — Encounter: Payer: Self-pay | Admitting: Oncology

## 2019-11-14 VITALS — BP 123/49 | HR 67 | Temp 97.7°F | Resp 17 | Wt 161.4 lb

## 2019-11-14 DIAGNOSIS — Z79899 Other long term (current) drug therapy: Secondary | ICD-10-CM | POA: Diagnosis not present

## 2019-11-14 DIAGNOSIS — M1990 Unspecified osteoarthritis, unspecified site: Secondary | ICD-10-CM | POA: Insufficient documentation

## 2019-11-14 DIAGNOSIS — Z5189 Encounter for other specified aftercare: Secondary | ICD-10-CM | POA: Insufficient documentation

## 2019-11-14 DIAGNOSIS — G620 Drug-induced polyneuropathy: Secondary | ICD-10-CM | POA: Diagnosis not present

## 2019-11-14 DIAGNOSIS — C541 Malignant neoplasm of endometrium: Secondary | ICD-10-CM

## 2019-11-14 DIAGNOSIS — Z823 Family history of stroke: Secondary | ICD-10-CM | POA: Insufficient documentation

## 2019-11-14 DIAGNOSIS — C786 Secondary malignant neoplasm of retroperitoneum and peritoneum: Secondary | ICD-10-CM

## 2019-11-14 DIAGNOSIS — Z881 Allergy status to other antibiotic agents status: Secondary | ICD-10-CM | POA: Insufficient documentation

## 2019-11-14 DIAGNOSIS — M25561 Pain in right knee: Secondary | ICD-10-CM | POA: Diagnosis not present

## 2019-11-14 DIAGNOSIS — I2510 Atherosclerotic heart disease of native coronary artery without angina pectoris: Secondary | ICD-10-CM | POA: Diagnosis not present

## 2019-11-14 DIAGNOSIS — I700 Atherosclerosis of aorta: Secondary | ICD-10-CM | POA: Diagnosis not present

## 2019-11-14 DIAGNOSIS — R5383 Other fatigue: Secondary | ICD-10-CM | POA: Insufficient documentation

## 2019-11-14 DIAGNOSIS — Z833 Family history of diabetes mellitus: Secondary | ICD-10-CM | POA: Diagnosis not present

## 2019-11-14 DIAGNOSIS — Z808 Family history of malignant neoplasm of other organs or systems: Secondary | ICD-10-CM | POA: Insufficient documentation

## 2019-11-14 DIAGNOSIS — Z8571 Personal history of Hodgkin lymphoma: Secondary | ICD-10-CM | POA: Insufficient documentation

## 2019-11-14 DIAGNOSIS — T451X5A Adverse effect of antineoplastic and immunosuppressive drugs, initial encounter: Secondary | ICD-10-CM | POA: Diagnosis not present

## 2019-11-14 DIAGNOSIS — Z803 Family history of malignant neoplasm of breast: Secondary | ICD-10-CM | POA: Insufficient documentation

## 2019-11-14 DIAGNOSIS — Z888 Allergy status to other drugs, medicaments and biological substances status: Secondary | ICD-10-CM | POA: Insufficient documentation

## 2019-11-14 DIAGNOSIS — R1031 Right lower quadrant pain: Secondary | ICD-10-CM | POA: Diagnosis not present

## 2019-11-14 DIAGNOSIS — Z820 Family history of epilepsy and other diseases of the nervous system: Secondary | ICD-10-CM | POA: Diagnosis not present

## 2019-11-14 DIAGNOSIS — Z5111 Encounter for antineoplastic chemotherapy: Secondary | ICD-10-CM | POA: Insufficient documentation

## 2019-11-14 DIAGNOSIS — Z90710 Acquired absence of both cervix and uterus: Secondary | ICD-10-CM | POA: Insufficient documentation

## 2019-11-14 DIAGNOSIS — Z882 Allergy status to sulfonamides status: Secondary | ICD-10-CM | POA: Diagnosis not present

## 2019-11-14 DIAGNOSIS — D6481 Anemia due to antineoplastic chemotherapy: Secondary | ICD-10-CM | POA: Diagnosis not present

## 2019-11-14 DIAGNOSIS — E7800 Pure hypercholesterolemia, unspecified: Secondary | ICD-10-CM

## 2019-11-14 DIAGNOSIS — Z87891 Personal history of nicotine dependence: Secondary | ICD-10-CM | POA: Insufficient documentation

## 2019-11-14 DIAGNOSIS — Z90722 Acquired absence of ovaries, bilateral: Secondary | ICD-10-CM | POA: Diagnosis not present

## 2019-11-14 DIAGNOSIS — Z800 Family history of malignant neoplasm of digestive organs: Secondary | ICD-10-CM | POA: Insufficient documentation

## 2019-11-14 DIAGNOSIS — Z8042 Family history of malignant neoplasm of prostate: Secondary | ICD-10-CM | POA: Insufficient documentation

## 2019-11-14 DIAGNOSIS — Z95828 Presence of other vascular implants and grafts: Secondary | ICD-10-CM

## 2019-11-14 LAB — COMPREHENSIVE METABOLIC PANEL
ALT: 58 U/L — ABNORMAL HIGH (ref 0–44)
AST: 66 U/L — ABNORMAL HIGH (ref 15–41)
Albumin: 3.6 g/dL (ref 3.5–5.0)
Alkaline Phosphatase: 115 U/L (ref 38–126)
Anion gap: 8 (ref 5–15)
BUN: 18 mg/dL (ref 8–23)
CO2: 24 mmol/L (ref 22–32)
Calcium: 8.7 mg/dL — ABNORMAL LOW (ref 8.9–10.3)
Chloride: 106 mmol/L (ref 98–111)
Creatinine, Ser: 1.28 mg/dL — ABNORMAL HIGH (ref 0.44–1.00)
GFR calc Af Amer: 49 mL/min — ABNORMAL LOW (ref >60)
GFR calc non Af Amer: 42 mL/min — ABNORMAL LOW (ref >60)
Glucose, Bld: 96 mg/dL (ref 70–99)
Potassium: 4.6 mmol/L (ref 3.5–5.1)
Sodium: 138 mmol/L (ref 135–145)
Total Bilirubin: 0.8 mg/dL (ref 0.3–1.2)
Total Protein: 6.7 g/dL (ref 6.5–8.1)

## 2019-11-14 LAB — CBC WITH DIFFERENTIAL/PLATELET
Abs Immature Granulocytes: 0.03 10*3/uL (ref 0.00–0.07)
Basophils Absolute: 0 10*3/uL (ref 0.0–0.1)
Basophils Relative: 1 %
Eosinophils Absolute: 0.2 10*3/uL (ref 0.0–0.5)
Eosinophils Relative: 4 %
HCT: 31.9 % — ABNORMAL LOW (ref 36.0–46.0)
Hemoglobin: 10.5 g/dL — ABNORMAL LOW (ref 12.0–15.0)
Immature Granulocytes: 1 %
Lymphocytes Relative: 14 %
Lymphs Abs: 0.8 10*3/uL (ref 0.7–4.0)
MCH: 28.8 pg (ref 26.0–34.0)
MCHC: 32.9 g/dL (ref 30.0–36.0)
MCV: 87.4 fL (ref 80.0–100.0)
Monocytes Absolute: 0.2 10*3/uL (ref 0.1–1.0)
Monocytes Relative: 4 %
Neutro Abs: 4.5 10*3/uL (ref 1.7–7.7)
Neutrophils Relative %: 76 %
Platelets: 164 10*3/uL (ref 150–400)
RBC: 3.65 MIL/uL — ABNORMAL LOW (ref 3.87–5.11)
RDW: 13.2 % (ref 11.5–15.5)
WBC: 5.9 10*3/uL (ref 4.0–10.5)
nRBC: 0 % (ref 0.0–0.2)

## 2019-11-14 LAB — LIPID PANEL
Cholesterol: 110 mg/dL (ref 0–200)
HDL: 40 mg/dL — ABNORMAL LOW (ref >40)
LDL Cholesterol: 50 mg/dL (ref 0–99)
Total CHOL/HDL Ratio: 2.8 RATIO
Triglycerides: 101 mg/dL (ref <150)
VLDL: 20 mg/dL (ref 0–40)

## 2019-11-14 MED ORDER — SODIUM CHLORIDE 0.9 % IV SOLN
146.0000 mg | Freq: Once | INTRAVENOUS | Status: AC
  Administered 2019-11-14: 150 mg via INTRAVENOUS
  Filled 2019-11-14: qty 15

## 2019-11-14 MED ORDER — SODIUM CHLORIDE 0.9% FLUSH
10.0000 mL | Freq: Once | INTRAVENOUS | Status: AC
  Administered 2019-11-14: 10 mL via INTRAVENOUS
  Filled 2019-11-14: qty 10

## 2019-11-14 MED ORDER — PEGFILGRASTIM 6 MG/0.6ML ~~LOC~~ PSKT
6.0000 mg | PREFILLED_SYRINGE | Freq: Once | SUBCUTANEOUS | Status: AC
  Administered 2019-11-14: 6 mg via SUBCUTANEOUS
  Filled 2019-11-14: qty 0.6

## 2019-11-14 MED ORDER — HEPARIN SOD (PORK) LOCK FLUSH 100 UNIT/ML IV SOLN
500.0000 [IU] | Freq: Once | INTRAVENOUS | Status: AC | PRN
  Administered 2019-11-14: 500 [IU]
  Filled 2019-11-14: qty 5

## 2019-11-14 MED ORDER — SODIUM CHLORIDE 0.9 % IV SOLN
1400.0000 mg | Freq: Once | INTRAVENOUS | Status: AC
  Administered 2019-11-14: 1400 mg via INTRAVENOUS
  Filled 2019-11-14: qty 26.3

## 2019-11-14 MED ORDER — PALONOSETRON HCL INJECTION 0.25 MG/5ML
0.2500 mg | Freq: Once | INTRAVENOUS | Status: AC
  Administered 2019-11-14: 0.25 mg via INTRAVENOUS
  Filled 2019-11-14: qty 5

## 2019-11-14 MED ORDER — SODIUM CHLORIDE 0.9 % IV SOLN
10.0000 mg | Freq: Once | INTRAVENOUS | Status: AC
  Administered 2019-11-14: 10 mg via INTRAVENOUS
  Filled 2019-11-14: qty 10

## 2019-11-14 MED ORDER — SODIUM CHLORIDE 0.9 % IV SOLN
Freq: Once | INTRAVENOUS | Status: AC
  Filled 2019-11-14: qty 250

## 2019-11-14 NOTE — Progress Notes (Signed)
1130: Dr. Janese Banks aware of ALT and AST levels. No new orders at this time per Dr. Janese Banks.   [id number]: Written and Verbal Neulasta On Pro instructions given. Pt verbalizes understanding. Pt stable at discharge.

## 2019-11-15 ENCOUNTER — Ambulatory Visit: Payer: PPO

## 2019-11-15 ENCOUNTER — Encounter: Payer: Self-pay | Admitting: Oncology

## 2019-11-20 NOTE — Progress Notes (Signed)
Pharmacist Chemotherapy Monitoring - Follow Up Assessment    I verify that I have reviewed each item in the below checklist:  . Regimen for the patient is scheduled for the appropriate day and plan matches scheduled date. Marland Kitchen Appropriate non-routine labs are ordered dependent on drug ordered. . If applicable, additional medications reviewed and ordered per protocol based on lifetime cumulative doses and/or treatment regimen.   Plan for follow-up and/or issues identified: No . I-vent associated with next due treatment: No . MD and/or nursing notified: No  Jorie Zee K 11/20/2019 8:28 AM

## 2019-11-24 ENCOUNTER — Other Ambulatory Visit: Payer: Self-pay

## 2019-11-24 ENCOUNTER — Encounter
Admission: RE | Admit: 2019-11-24 | Discharge: 2019-11-24 | Disposition: A | Discharge status: Home / Self Care | Payer: PPO | Source: Ambulatory Visit | Attending: Family | Admitting: Family

## 2019-11-24 DIAGNOSIS — I5042 Chronic combined systolic (congestive) and diastolic (congestive) heart failure: Secondary | ICD-10-CM | POA: Diagnosis not present

## 2019-11-24 DIAGNOSIS — I2510 Atherosclerotic heart disease of native coronary artery without angina pectoris: Secondary | ICD-10-CM

## 2019-11-24 LAB — NM MYOCAR MULTI W/SPECT W/WALL MOTION / EF
LV dias vol: 137 mL (ref 46–106)
LV sys vol: 84 mL
Peak HR: 112 {beats}/min
Percent HR: 74 %
Rest HR: 72 {beats}/min
SDS: 15
SRS: 4
SSS: 21
TID: 0.95

## 2019-11-24 MED ORDER — TECHNETIUM TC 99M TETROFOSMIN IV KIT
32.3800 | PACK | Freq: Once | INTRAVENOUS | Status: AC | PRN
  Administered 2019-11-24: 32.38 via INTRAVENOUS

## 2019-11-24 MED ORDER — REGADENOSON 0.4 MG/5ML IV SOLN
0.4000 mg | Freq: Once | INTRAVENOUS | Status: AC
  Administered 2019-11-24: 0.4 mg via INTRAVENOUS

## 2019-11-24 MED ORDER — TECHNETIUM TC 99M TETROFOSMIN IV KIT
10.0000 | PACK | Freq: Once | INTRAVENOUS | Status: AC | PRN
  Administered 2019-11-24: 10.56 via INTRAVENOUS

## 2019-11-27 ENCOUNTER — Encounter: Payer: Self-pay | Admitting: Oncology

## 2019-11-27 ENCOUNTER — Inpatient Hospital Stay: Payer: PPO

## 2019-11-27 ENCOUNTER — Telehealth: Payer: Self-pay | Admitting: Family

## 2019-11-27 ENCOUNTER — Other Ambulatory Visit: Payer: Self-pay

## 2019-11-27 ENCOUNTER — Inpatient Hospital Stay (HOSPITAL_BASED_OUTPATIENT_CLINIC_OR_DEPARTMENT_OTHER): Payer: PPO | Admitting: Oncology

## 2019-11-27 VITALS — BP 109/51 | HR 65 | Temp 94.8°F | Resp 16 | Wt 163.1 lb

## 2019-11-27 DIAGNOSIS — Z5111 Encounter for antineoplastic chemotherapy: Secondary | ICD-10-CM | POA: Diagnosis not present

## 2019-11-27 DIAGNOSIS — G620 Drug-induced polyneuropathy: Secondary | ICD-10-CM | POA: Diagnosis not present

## 2019-11-27 DIAGNOSIS — I2510 Atherosclerotic heart disease of native coronary artery without angina pectoris: Secondary | ICD-10-CM

## 2019-11-27 DIAGNOSIS — D6959 Other secondary thrombocytopenia: Secondary | ICD-10-CM

## 2019-11-27 DIAGNOSIS — D6481 Anemia due to antineoplastic chemotherapy: Secondary | ICD-10-CM

## 2019-11-27 DIAGNOSIS — C541 Malignant neoplasm of endometrium: Secondary | ICD-10-CM | POA: Diagnosis not present

## 2019-11-27 DIAGNOSIS — C801 Malignant (primary) neoplasm, unspecified: Secondary | ICD-10-CM | POA: Diagnosis not present

## 2019-11-27 DIAGNOSIS — Z95828 Presence of other vascular implants and grafts: Secondary | ICD-10-CM | POA: Diagnosis not present

## 2019-11-27 DIAGNOSIS — T451X5A Adverse effect of antineoplastic and immunosuppressive drugs, initial encounter: Secondary | ICD-10-CM | POA: Diagnosis not present

## 2019-11-27 DIAGNOSIS — C786 Secondary malignant neoplasm of retroperitoneum and peritoneum: Secondary | ICD-10-CM

## 2019-11-27 DIAGNOSIS — M25561 Pain in right knee: Secondary | ICD-10-CM

## 2019-11-27 DIAGNOSIS — Z01812 Encounter for preprocedural laboratory examination: Secondary | ICD-10-CM

## 2019-11-27 LAB — CBC WITH DIFFERENTIAL/PLATELET
Abs Immature Granulocytes: 3.42 10*3/uL — ABNORMAL HIGH (ref 0.00–0.07)
Basophils Absolute: 0 10*3/uL (ref 0.0–0.1)
Basophils Relative: 0 %
Eosinophils Absolute: 0 10*3/uL (ref 0.0–0.5)
Eosinophils Relative: 0 %
HCT: 27.1 % — ABNORMAL LOW (ref 36.0–46.0)
Hemoglobin: 9 g/dL — ABNORMAL LOW (ref 12.0–15.0)
Immature Granulocytes: 18 %
Lymphocytes Relative: 8 %
Lymphs Abs: 1.5 10*3/uL (ref 0.7–4.0)
MCH: 28.9 pg (ref 26.0–34.0)
MCHC: 33.2 g/dL (ref 30.0–36.0)
MCV: 87.1 fL (ref 80.0–100.0)
Monocytes Absolute: 1.9 10*3/uL — ABNORMAL HIGH (ref 0.1–1.0)
Monocytes Relative: 10 %
Neutro Abs: 12.7 10*3/uL — ABNORMAL HIGH (ref 1.7–7.7)
Neutrophils Relative %: 64 %
Platelets: 107 10*3/uL — ABNORMAL LOW (ref 150–400)
RBC: 3.11 MIL/uL — ABNORMAL LOW (ref 3.87–5.11)
RDW: 13.4 % (ref 11.5–15.5)
Smear Review: NORMAL
WBC: 19.6 10*3/uL — ABNORMAL HIGH (ref 4.0–10.5)
nRBC: 0 % (ref 0.0–0.2)

## 2019-11-27 LAB — COMPREHENSIVE METABOLIC PANEL
ALT: 59 U/L — ABNORMAL HIGH (ref 0–44)
AST: 41 U/L (ref 15–41)
Albumin: 3.5 g/dL (ref 3.5–5.0)
Alkaline Phosphatase: 123 U/L (ref 38–126)
Anion gap: 8 (ref 5–15)
BUN: 21 mg/dL (ref 8–23)
CO2: 27 mmol/L (ref 22–32)
Calcium: 8.5 mg/dL — ABNORMAL LOW (ref 8.9–10.3)
Chloride: 104 mmol/L (ref 98–111)
Creatinine, Ser: 1.7 mg/dL — ABNORMAL HIGH (ref 0.44–1.00)
GFR calc Af Amer: 35 mL/min — ABNORMAL LOW (ref >60)
GFR calc non Af Amer: 30 mL/min — ABNORMAL LOW (ref >60)
Glucose, Bld: 96 mg/dL (ref 70–99)
Potassium: 4.4 mmol/L (ref 3.5–5.1)
Sodium: 139 mmol/L (ref 135–145)
Total Bilirubin: 0.7 mg/dL (ref 0.3–1.2)
Total Protein: 6 g/dL — ABNORMAL LOW (ref 6.5–8.1)

## 2019-11-27 MED ORDER — HEPARIN SOD (PORK) LOCK FLUSH 100 UNIT/ML IV SOLN
500.0000 [IU] | Freq: Once | INTRAVENOUS | Status: AC
  Administered 2019-11-27: 500 [IU] via INTRAVENOUS
  Filled 2019-11-27: qty 5

## 2019-11-27 NOTE — Telephone Encounter (Addendum)
Reviewed high risk stress test and need for cardiac catheterization. Discussed risks and benefits of cardiac catheterization. Will plan for [facility] in setting of reduced LVEF and high risk myoview. The patient understands that risks include but are not limited to stroke (1 in 1000), death (1 in 32), kidney failure [usually temporary] (1 in 500), bleeding (1 in 200), allergic reaction [possibly serious] (1 in 200), and agrees to proceed. She was offered clinic appointment to discuss, but she verbalized comfort proceeding with procedure based off of her previous experience and phone conversation.   Scheduling somewhat difficult due to undergoing chemotherapy. Has infusions scheduled 12/04/19, 12/11/19, 12/18/19. Infusion cancelled 11/27/19 due to low platelets. Discussed with Dr. Janese Banks her oncologist, agreeable to reschedule her infusion 12/04/19 for later in the week.   Labs 11/27/19: Creatinine 1.71, GFR 30, BUN 21, Hb 9, Platelets 107 Labs 11/14/19: Creatinine 1.28, GFR 42, BUN 18, Hb 10.5, Platelet 164  Creatinine range over the last year 1.11-1.7 with approximate average of 1.3.  GFR range over the last year 30-51 with average 40.    Loel Dubonnet, NP

## 2019-11-28 NOTE — Addendum Note (Signed)
Addended by: Alvis Lemmings C on: 11/28/2019 03:09 PM   Modules accepted: Orders

## 2019-11-28 NOTE — Telephone Encounter (Signed)
I spoke with the patient regarding her cardiac catheterization instructions as listed below. The patient voiced understanding of these instructions. She is aware that her arrival time may change based on her BMP results and possible need for pre-hydration. A copy has also been sent through her MyChart as well.    You are scheduled for a Cardiac Catheterization on Monday, June 21 with Dr. [name].  1. Please arrive at the [place] [facility] 9:30 AM (This time is one hour before your procedure to ensure your preparation). Free valet parking service is available.   Special note: Every effort is made to have your procedure done on time. Please understand that emergencies sometimes delay scheduled procedures.  2. Diet: Do not eat solid foods after midnight.  You may have clear liquids until 5am upon the day of the procedure.  3. Labs:   Pre-Procedure lab work: Thursday 11/30/19 (7:30 am- 12:30 pm) - come to the [place] entrance at [facility], 1st desk on the right to check in (past the screening table).   Pre- Procedure COVID swab: Thursday 11/30/19 (8:00 am- 1:00 pm) - come to the [facility] entrance - this is a drive up test only (staff will come out to the car to swab you)  4. Medication instructions in preparation for your procedure:  Contrast Allergy: NO   On the morning of your procedure, take your Aspirin and any morning medicines NOT listed above.  You may use sips of water.  5. Plan for one night stay--bring personal belongings. 6. Bring a current list of your medications and current insurance cards. 7. You MUST have a responsible person to drive you home. 8. Someone MUST be with you the first 24 hours after you arrive home or your discharge will be delayed. 9. Please wear clothes that are easy to get on and off and wear slip-on shoes.  Thank you for allowing [place] to care for you!   -- [place] [facility]

## 2019-11-28 NOTE — Progress Notes (Signed)
   Hematology/Oncology Consult note [facility]  [phone] [phone]  Patient Care Team: Tullo, Teresa L, MD as PCP - General (Internal Medicine) Arida, Muhammad A, MD as PCP - Cardiology (Cardiology) Sankar, Seeplaputhur G, MD as Consulting Physician (General Surgery) Berchuck, Andrew, MD as Referring Physician (Obstetrics and Gynecology) Secord, Angeles Alvarez, MD as Referring Physician (Obstetrics and Gynecology) Brandon, Ashley, MD as Consulting Physician (Urology) Stanton, Kristi D, RN as Registered Nurse Rao, Archana C, MD as Consulting Physician (Oncology)   Name of the patient: Jaime Hall  [id number]  08/28/1950   Date of visit: 11/28/19  Diagnosis- 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 She underwent robotic hysterectomy with bilateral salpingo-oophorectomy with washings and sentinel lymph node injection mapping and biopsy in February 2018. She had multiple postoperative complications and completed 3 cycles of carbotaxol chemotherapy in May 2018. Treatment was complicated by chemo-induced peripheral neuropathy for which Taxol dose was reduced by 25%. She completed 5 weeks of external beam whole pelvic radiation on August 2018.Her original tumor in 2018 was ER +1%. PR negative. MSI stable and HER-2 negative  She was under clinical surveillance and recently presented to Dr. Tullo with symptoms of right lower quadrant abdominal pain which led to aCT abdomen which showed new peritoneal metastatic disease throughout the right abdomen and pelvis with index masses measuring 5.7 x 4.7 cm. Head CT  scan also showed evidence of hypermetabolic abdominal pelvic peritoneal metastases but no evidence of other sites of metastases.  Repeat omental biopsy confirms high grade serous carcinoma. Foundation 1 testing showed following alteration/biomarkers.  Microsatellite status stable.  EPH for be amplification, ESR 1 amplification, HNF1 A G 292 FS 25, follow-up 1 amplification, with 3 ER 1 loss, PPP 2R1 AAS 256F, PTEN loss, T p53.  However none of these mutations have actionable targets.   Interval history-patient feels well and denies any complaints at this time.  Denies any chest pain or shortness of breath.  She underwent a Nuclear stress test which showed a large region of ischemia in the anterior and anterolateral region.  Septal wall hypokinesis.  EF 41%.  No EKG changes concerning for ischemia at peak stress or in recovery.  High risk scan  Reports that her appetite and weight are stable.  Denies any abdominal pain or changes in her bowel habits.  Denies any nausea or vomiting  ECOG PS- 1 Pain scale- 0   Review of systems- Review of Systems  Constitutional: Positive for malaise/fatigue. Negative for chills, fever and weight loss.  HENT: Negative for congestion, ear discharge and nosebleeds.   Eyes: Negative for blurred vision.  Respiratory: Negative for cough, hemoptysis, sputum production, shortness of breath and wheezing.   Cardiovascular: Negative for chest pain, palpitations, orthopnea and claudication.  Gastrointestinal: Negative for abdominal pain, blood in stool, constipation, diarrhea, heartburn, melena, nausea and vomiting.  Genitourinary: Negative for dysuria, flank pain, frequency, hematuria and urgency.  Musculoskeletal: Negative for back pain, joint pain and myalgias.  Skin: Negative for rash.  Neurological: Negative for dizziness, tingling, focal weakness, seizures, weakness and headaches.  Endo/Heme/Allergies: Does not bruise/bleed easily.  Psychiatric/Behavioral:  Negative for depression and suicidal ideas. The patient does not have insomnia.         Allergies  Allergen Reactions  . Adhesive [Tape] Other (See Comments)    Burns the skin  . Z-Pak [Azithromycin] Itching  . Antifungal [Miconazole Nitrate] Rash  . Sulfa Antibiotics Nausea And Vomiting and Rash    N/T     Past Medical History:  Diagnosis Date  . Anxiety   . Cervicalgia   . Coronary artery disease    a. 02/2012 Stress echo: severe anterior wall ischemia;  b. 02/2012 Cath/PCI: LAD 95p (3.0 x 15 Xience EX DES), D1 90ost (PTCA - bifurcational dzs), EF 45% with anterior HK;  b. 02/2013 Ex MV: fixed anterior defect w/ minor reversibility, nl EF-->Med Rx.  . Endometrial cancer ([place])    a. 07/2016 s/p robotic hysterectomy, BSO w/ washings, sentinel node inj, mapping, bx, adhesiolysis.  . Essential hypertension, benign   . Fibrocystic breast disease   . GERD (gastroesophageal reflux disease)   . Gestational hypertension   . Heart murmur   . History of anemia   . History of blood transfusion   . Hodgkin's lymphoma ([place]) 2011   a. s/p radiation and chemo therapy  . Osteoarthritis   . Polycystic ovarian disease      Past Surgical History:  Procedure Laterality Date  . ABDOMINAL HYSTERECTOMY    . bladder sling    . CARDIAC CATHETERIZATION  02/2012   [facility] 1 stent place  . CERVICAL POLYPECTOMY    . CHOLECYSTECTOMY  1982  . COLONOSCOPY WITH PROPOFOL N/A 02/05/2015   Procedure: COLONOSCOPY WITH PROPOFOL;  Surgeon: Lucilla Lame, MD;  Location: [facility] ENDOSCOPY;  Service: Endoscopy;  Laterality: N/A;  . CORONARY ANGIOPLASTY  02/2012   left/right s/p balloon  . CYSTOGRAM N/A 08/17/2016   Procedure: CYSTOGRAM;  Surgeon: Hollice Espy, MD;  Location: [facility] ORS;  Service: Urology;  Laterality: N/A;  . CYSTOSCOPY N/A 08/17/2016   Procedure: CYSTOSCOPY EXAM UNDER ANESTHESIA;  Surgeon: Hollice Espy, MD;  Location: [facility] ORS;  Service: Urology;  Laterality: N/A;  . CYSTOSCOPY W/ RETROGRADES Bilateral  08/17/2016   Procedure: CYSTOSCOPY WITH RETROGRADE PYELOGRAM;  Surgeon: Hollice Espy, MD;  Location: [facility] ORS;  Service: Urology;  Laterality: Bilateral;  . CYSTOSCOPY WITH STENT PLACEMENT Right 08/17/2016   Procedure: CYSTOSCOPY WITH STENT PLACEMENT;  Surgeon: Hollice Espy, MD;  Location: [facility] ORS;  Service: Urology;  Laterality: Right;  . heart stent'  2013  . kidney stent Right 2018  . LYMPH NODE BIOPSY  2011   diagnosis of hodgkins lymphoma  . PELVIC LYMPH NODE DISSECTION N/A 07/29/2016   Procedure: PELVIC/AORTIC LYMPH NODE SAMPLING;  Surgeon: [name], MD;  Location: [facility] ORS;  Service: Gynecology;  Laterality: N/A;  . PORTA CATH INSERTION N/A 09/22/2016   Procedure: Glori Luis Cath Insertion;  Surgeon: [name], MD;  Location: [place] [facility];  Service: Cardiovascular;  Laterality: N/A;  . PORTA CATH INSERTION N/A 10/27/2019   Procedure: PORTA CATH INSERTION;  Surgeon: [name], MD;  Location: [place] [facility];  Service: Cardiovascular;  Laterality: N/A;  . PORTA CATH REMOVAL N/A 11/17/2016   Procedure: Glori Luis Cath Removal;  Surgeon: [name], MD;  Location: [place] [facility];  Service: Cardiovascular;  Laterality: N/A;  . ROBOTIC ASSISTED TOTAL HYSTERECTOMY WITH BILATERAL SALPINGO OOPHERECTOMY N/A 07/29/2016   Procedure: ROBOTIC ASSISTED TOTAL HYSTERECTOMY WITH BILATERAL SALPINGO OOPHORECTOMY;  Surgeon: [name], MD;  Location: [facility] ORS;  Service: Gynecology;  Laterality: N/A;  . SENTINEL NODE BIOPSY N/A 07/29/2016   Procedure: SENTINEL NODE BIOPSY;  Surgeon: Damita Dunnings  Alvarez Secord, MD;  Location: [facility] OR[facility];  Service: Gynecology;  Laterality: N/A;  . transobturator sling N/A 2009   [place]    Social History   Socioeconomic History  . Marital status: Widowed    Spouse name: Not on file  . Number of children: Not on file  . Years of education: Not on file  . Highest education level: Not on file  Occupational History  .  Not on file  Tobacco Use  . Smoking status: Former Smoker    Packs/day: 1.00    Years: 30.00    Pack years: 30.00    Types: Cigarettes    Quit date: 07/24/2002    Years since quitting: 17.3  . Smokeless tobacco: Never Used  . Tobacco comment: quit smoking in 2000  Vaping Use  . Vaping Use: Never used  Substance and Sexual Activity  . Alcohol use: Not Currently  . Drug use: No  . Sexual activity: Never  Other Topics Concern  . Not on file  Social History Narrative   She works in the cafeteria at school, bowls one night a week, and push mows the lawn.    Social Determinants of Health   Financial Resource Strain:   . Difficulty of Paying Living Expenses:   Food Insecurity:   . Worried About Running Out of Food in the Last Year:   . Ran Out of Food in the Last Year:   Transportation Needs:   . Lack of Transportation (Medical):   . Lack of Transportation (Non-Medical):   Physical Activity:   . Days of Exercise per Week:   . Minutes of Exercise per Session:   Stress:   . Feeling of Stress :   Social Connections:   . Frequency of Communication with Friends and Family:   . Frequency of Social Gatherings with Friends and Family:   . Attends Religious Services:   . Active Member of Clubs or Organizations:   . Attends Club or Organization Meetings:   . Marital Status:   Intimate Partner Violence:   . Fear of Current or Ex-Partner:   . Emotionally Abused:   . Physically Abused:   . Sexually Abused:     Family History  Problem Relation Age of Onset  . ALS Father   . Polymyositis Father   . Diabetes Brother   . Cancer Maternal Aunt        breast  . Breast cancer Maternal Aunt        30's  . Stroke Maternal Grandmother   . Cancer Maternal Grandfather        prostate  . Stroke Maternal Grandfather   . Colon cancer Maternal Aunt   . Non-Hodgkin's lymphoma Cousin      Current Outpatient Medications:  .  aspirin EC 81 MG tablet, Take 81 mg by mouth at bedtime., Disp:  , Rfl:  .  atorvastatin (LIPITOR) 10 MG tablet, Take 1 tablet by mouth once daily (Patient taking differently: Take 10 mg by mouth every evening. ), Disp: 90 tablet, Rfl: 2 .  cholecalciferol (VITAMIN D3) 25 MCG (1000 UNIT) tablet, Take 1,000 Units by mouth in the morning and at bedtime., Disp: , Rfl:  .  cyanocobalamin (,VITAMIN B-12,) 1000 MCG/ML injection, INJECT 1ML IM WEEKLY FOR 4 WEEKS, THEN INJECT MONTHLY THEREAFTER (Patient taking differently: Inject 1,000 mcg into the skin every 30 (thirty) days. ), Disp: 10 mL, Rfl: 0 .  diazepam (VALIUM) 5 MG tablet, Take 1 tablet (5 mg total) by mouth   every 12 (twelve) hours as needed for anxiety. (Patient taking differently: Take 2.5 mg by mouth every 12 (twelve) hours as needed for anxiety. ), Disp: 30 tablet, Rfl: 0 .  DM-APAP-CPM (CORICIDIN HBP PO), Take 1 tablet by mouth daily as needed (allergies)., Disp: , Rfl:  .  DULoxetine (CYMBALTA) 30 MG capsule, Take 1 capsule by mouth once daily (Patient taking differently: Take 30 mg by mouth daily. ), Disp: 90 capsule, Rfl: 0 .  lidocaine-prilocaine (EMLA) cream, Apply to affected area once (Patient taking differently: Apply to affected area during port access), Disp: 30 g, Rfl: 3 .  metoprolol tartrate (LOPRESSOR) 25 MG tablet, Take 1 tablet by mouth twice daily (Patient taking differently: Take 25 mg by mouth 2 (two) times daily. ), Disp: 180 tablet, Rfl: 2 .  nitroGLYCERIN (NITROSTAT) 0.4 MG SL tablet, Place 1 tablet (0.4 mg total) under the tongue every 5 (five) minutes as needed for chest pain., Disp: 25 tablet, Rfl: 3 .  nystatin (MYCOSTATIN/NYSTOP) powder, Apply 1 application topically 2 (two) times daily. To rash until resolved., Disp: 30 g, Rfl: 0 .  Syringe/Needle, Disp, (SYRINGE 3CC/25GX1") 25G X 1" 3 ML MISC, Use for b12 injections, Disp: 50 each, Rfl: 0 .  Turmeric 500 MG CAPS, Take 500 mg by mouth daily. , Disp: , Rfl:  .  esomeprazole (NEXIUM) 20 MG capsule, Take 20 mg by mouth every other day.  (Patient not taking: Reported on 11/27/2019), Disp: , Rfl:  .  LORazepam (ATIVAN) 0.5 MG tablet, Take 1 tablet (0.5 mg total) by mouth every 6 (six) hours as needed (Nausea or vomiting). (Patient not taking: Reported on 11/02/2019), Disp: 30 tablet, Rfl: 0 .  ondansetron (ZOFRAN) 8 MG tablet, Take 1 tablet (8 mg total) by mouth 2 (two) times daily as needed (Nausea or vomiting). (Patient not taking: Reported on 11/02/2019), Disp: 30 tablet, Rfl: 1 .  prochlorperazine (COMPAZINE) 10 MG tablet, Take 1 tablet (10 mg total) by mouth every 6 (six) hours as needed (Nausea or vomiting). (Patient not taking: Reported on 11/02/2019), Disp: 30 tablet, Rfl: 1  Physical exam:  Vitals:   11/27/19 0926  BP: (!) 109/51  Pulse: 65  Resp: 16  Temp: (!) 94.8 F (34.9 C)  TempSrc: Tympanic  SpO2: 100%  Weight: 163 lb 1.6 oz (74 kg)   Physical Exam Constitutional:      General: She is not in acute distress. Cardiovascular:     Rate and Rhythm: Normal rate and regular rhythm.     Heart sounds: Normal heart sounds.  Pulmonary:     Effort: Pulmonary effort is normal.     Breath sounds: Normal breath sounds.  Abdominal:     General: Bowel sounds are normal.     Palpations: Abdomen is soft.  Skin:    General: Skin is warm and dry.  Neurological:     Mental Status: She is alert and oriented to person, place, and time.      CMP Latest Ref Rng & Units 11/27/2019  Glucose 70 - 99 mg/dL 96  BUN 8 - 23 mg/dL 21  Creatinine 0.44 - 1.00 mg/dL 1.70(H)  Sodium 135 - 145 mmol/L 139  Potassium 3.5 - 5.1 mmol/L 4.4  Chloride 98 - 111 mmol/L 104  CO2 22 - 32 mmol/L 27  Calcium 8.9 - 10.3 mg/dL 8.5(L)  Total Protein 6.5 - 8.1 g/dL 6.0(L)  Total Bilirubin 0.3 - 1.2 mg/dL 0.7  Alkaline Phos 38 - 126 U/L 123  AST 15 -   41 U/L 41  ALT 0 - 44 U/L 59(H)   CBC Latest Ref Rng & Units 11/27/2019  WBC 4.0 - 10.5 K/uL 19.6(H)  Hemoglobin 12.0 - 15.0 g/dL 9.0(L)  Hematocrit 36 - 46 % 27.1(L)  Platelets 150 - 400 K/uL  107(L)    No images are attached to the encounter.  NM Myocar Multi W/Spect W/Wall Motion / EF  Addendum Date: 11/24/2019   Pharmacological myocardial perfusion imaging study with large region of ischemia in the anterior and anterolateral region Septal wall hypokinesis, EF estimated at 41% (depressed ejection fraction possibly secondary to significant GI uptake artifact) No EKG changes concerning for ischemia at peak stress or in recovery. CT attenuation correction image with mild aortic atherosclerosis, mild coronary calcification High risk scan Signed, Tim Gollan, MD, Ph.D [facility]   Result Date: 11/24/2019 Pharmacological myocardial perfusion imaging study with large region of ischemia in the anterior and anterolateral region Septal wall hypokinesis, EF estimated at 41% (depressed ejection fraction possibly secondary to significant GI uptake artifact) No EKG changes concerning for ischemia at peak stress or in recovery. CT attenuation correction image with mild aortic atherosclerosis, mild coronary calcification Low risk scan Signed, Tim Gollan, MD, Ph.D [facility]   ECHOCARDIOGRAM COMPLETE  Result Date: 11/02/2019    ECHOCARDIOGRAM REPORT   Patient Name:   Ileanna S Shreeve Date of Exam: 11/02/2019 Medical Rec #:  [id number]         Height:       62.0 in Accession #:    [id number]        Weight:       163.8 lb Date of Birth:  12/01/1950         BSA:          1.756 m Patient Age:    69 years          BP:           112/50 mmHg Patient Gender: F                 HR:           87 bpm. Exam Location:  [facility] Procedure: 2D Echo, Color Doppler and Cardiac Doppler Indications:     v58.11 Chemotherapy evaluation  History:         Patient has no prior history of Echocardiogram examinations.                  CAD; Risk Factors:Hypertension, Dyslipidemia and Former Smoker.  Sonographer:     Joan Heiss RDCS (AE) Referring Phys:  [id number] ARCHANA C RAO Diagnosing Phys: Timothy Gollan MD IMPRESSIONS  1. Left  ventricular ejection fraction, by estimation, is 30 to 35%. The left ventricle has moderately decreased function. The left ventricle demonstrates global hypokinesis. Left ventricular diastolic parameters are consistent with Grade I diastolic dysfunction (impaired relaxation).  2. Right ventricular systolic function is normal. The right ventricular size is normal. There is mildly elevated pulmonary artery systolic pressure. The estimated right ventricular systolic pressure is 36.4 mmHg.  3. Mild to moderate mitral valve regurgitation.  4. Aortic valve regurgitation is mild to moderate. FINDINGS  Left Ventricle: Left ventricular ejection fraction, by estimation, is 30 to 35%. The left ventricle has moderately decreased function. The left ventricle demonstrates global hypokinesis. The left ventricular internal cavity size was normal in size. There is no left ventricular hypertrophy. Left ventricular diastolic parameters are consistent with Grade I diastolic dysfunction (impaired relaxation). Right Ventricle: The right ventricular size   is normal. No increase in right ventricular wall thickness. Right ventricular systolic function is normal. There is mildly elevated pulmonary artery systolic pressure. The tricuspid regurgitant velocity is 2.57  m/s, and with an assumed right atrial pressure of 10 mmHg, the estimated right ventricular systolic pressure is 36.4 mmHg. Left Atrium: Left atrial size was normal in size. Right Atrium: Right atrial size was normal in size. Pericardium: There is no evidence of pericardial effusion. Mitral Valve: The mitral valve is normal in structure. Normal mobility of the mitral valve leaflets. Mild to moderate mitral valve regurgitation. No evidence of mitral valve stenosis. MV peak gradient, 6.6 mmHg. The mean mitral valve gradient is 4.0 mmHg. Tricuspid Valve: The tricuspid valve is normal in structure. Tricuspid valve regurgitation is mild . No evidence of tricuspid stenosis. Aortic Valve:  The aortic valve was not well visualized. Aortic valve regurgitation is mild to moderate. Aortic regurgitation PHT measures 369 msec. No aortic stenosis is present. Aortic valve mean gradient measures 8.0 mmHg. Aortic valve peak gradient measures 14.7 mmHg. Aortic valve area, by VTI measures 1.55 cm. Pulmonic Valve: The pulmonic valve was normal in structure. Pulmonic valve regurgitation is not visualized. No evidence of pulmonic stenosis. Aorta: The aortic root is normal in size and structure. Venous: The inferior vena cava is normal in size with greater than 50% respiratory variability, suggesting right atrial pressure of 3 mmHg. IAS/Shunts: No atrial level shunt detected by color flow Doppler.  LEFT VENTRICLE PLAX 2D LVIDd:         4.67 cm      Diastology LVIDs:         4.14 cm      LV e' lateral:   7.94 cm/s LV PW:         1.04 cm      LV E/e' lateral: 13.4 LV IVS:        0.84 cm      LV e' medial:    4.90 cm/s LVOT diam:     1.70 cm      LV E/e' medial:  21.6 LV SV:         58 LV SV Index:   33 LVOT Area:     2.27 cm  LV Volumes (MOD) LV vol d, MOD A2C: 120.0 ml LV vol d, MOD A4C: 118.0 ml LV vol s, MOD A2C: 59.8 ml LV vol s, MOD A4C: 60.1 ml LV SV MOD A2C:     60.2 ml LV SV MOD A4C:     118.0 ml LV SV MOD BP:      61.0 ml RIGHT VENTRICLE RV Basal diam:  3.07 cm TAPSE (M-mode): 2.4 cm LEFT ATRIUM             Index       RIGHT ATRIUM          Index LA diam:        3.40 cm 1.94 cm/m  RA Area:     9.19 cm LA Vol (A2C):   56.8 ml 32.34 ml/m RA Volume:   17.50 ml 9.96 ml/m LA Vol (A4C):   33.8 ml 19.25 ml/m LA Biplane Vol: 46.2 ml 26.31 ml/m  AORTIC VALVE                    PULMONIC VALVE AV Area (Vmax):    1.42 cm     PV Vmax:       0.71 m/s AV Area (Vmean):   1.47 cm       PV Vmean:      50.500 cm/s AV Area (VTI):     1.55 cm     PV VTI:        0.131 m AV Vmax:           192.00 cm/s  PV Peak grad:  2.0 mmHg AV Vmean:          130.000 cm/s PV Mean grad:  1.0 mmHg AV VTI:            0.372 m AV Peak Grad:       14.7 mmHg AV Mean Grad:      8.0 mmHg LVOT Vmax:         120.00 cm/s LVOT Vmean:        84.000 cm/s LVOT VTI:          0.254 m LVOT/AV VTI ratio: 0.68 AI PHT:            369 msec  AORTA Ao Root diam: 2.40 cm MITRAL VALVE                TRICUSPID VALVE MV Area (PHT): 5.34 cm     TR Peak grad:   26.4 mmHg MV Peak grad:  6.6 mmHg     TR Vmax:        257.00 cm/s MV Mean grad:  4.0 mmHg MV Vmax:       1.28 m/s     SHUNTS MV Vmean:      102.0 cm/s   Systemic VTI:  0.25 m MV Decel Time: 142 msec     Systemic Diam: 1.70 cm MV E velocity: 106.00 cm/s MV A velocity: 116.00 cm/s MV E/A ratio:  0.91 Ida Rogue MD Electronically signed by Ida Rogue MD Signature Date/Time: 11/02/2019/1:55:04 PM    Final      Assessment and plan- Patient is a 69 y.o. 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    She is here for on treatment assessment prior to cycle 2-day 1 of carboplatin and gemcitabine chemotherapy  Patient's white count is elevated at 19.6 today likely secondary to the Neulasta she received.  She is more anemic today with a hemoglobin of 9 as compared to a hemoglobin of 11.2 on cycle 1 day 1.  Her platelet counts were 257 at the start of cycle 1 and today they are down to 107.  I will therefore hold her chemotherapy today and plan to reschedule it by 1 week.  Patient had a high risk nuclear stress test and will be undergoing cardiac catheterization on 12/04/2019.  Okay per cardiology to do chemotherapy after the cardiac cath.    She will proceed with cycle 2-day 1 of gemcitabine chemotherapy next week and I will see her back in 2 weeks for cycle 2-day 8  Normocytic anemia: Likely secondary to chemotherapy.  We will add iron studies B12 and folate and reticulocyte count to her next set of labs   Visit Diagnosis 1. Endometrial cancer ([place])   2. Right anterior knee pain   3. Port-A-Cath in place   4. Encounter for antineoplastic  chemotherapy   5. Chemotherapy-induced peripheral neuropathy ([place])   6. Peritoneal carcinomatosis ([place])   7. Chemotherapy-induced thrombocytopenia   8. Antineoplastic chemotherapy induced anemia      Dr. Randa Evens, MD, MPH [facility] at [facility] [phone] 11/28/2019 12:20 PM

## 2019-11-30 ENCOUNTER — Telehealth: Payer: Self-pay | Admitting: Family

## 2019-11-30 ENCOUNTER — Other Ambulatory Visit
Admission: RE | Admit: 2019-11-30 | Discharge: 2019-11-30 | Disposition: A | Discharge status: Home / Self Care | Payer: PPO | Source: Ambulatory Visit | Attending: Cardiovascular Disease | Admitting: Cardiovascular Disease

## 2019-11-30 ENCOUNTER — Other Ambulatory Visit: Payer: Self-pay

## 2019-11-30 ENCOUNTER — Telehealth: Payer: Self-pay | Admitting: *Deleted

## 2019-11-30 ENCOUNTER — Other Ambulatory Visit
Admission: RE | Admit: 2019-11-30 | Discharge: 2019-11-30 | Disposition: A | Discharge status: Home / Self Care | Payer: PPO | Source: Home / Self Care | Attending: Family | Admitting: Family

## 2019-11-30 DIAGNOSIS — Z01812 Encounter for preprocedural laboratory examination: Secondary | ICD-10-CM | POA: Diagnosis not present

## 2019-11-30 DIAGNOSIS — I2510 Atherosclerotic heart disease of native coronary artery without angina pectoris: Secondary | ICD-10-CM | POA: Insufficient documentation

## 2019-11-30 DIAGNOSIS — Z20822 Contact with and (suspected) exposure to covid-19: Secondary | ICD-10-CM | POA: Diagnosis not present

## 2019-11-30 LAB — CBC WITH DIFFERENTIAL/PLATELET
Abs Immature Granulocytes: 1.82 10*3/uL — ABNORMAL HIGH (ref 0.00–0.07)
Basophils Absolute: 0.1 10*3/uL (ref 0.0–0.1)
Basophils Relative: 0 %
Eosinophils Absolute: 0 10*3/uL (ref 0.0–0.5)
Eosinophils Relative: 0 %
HCT: 28.1 % — ABNORMAL LOW (ref 36.0–46.0)
Hemoglobin: 9.1 g/dL — ABNORMAL LOW (ref 12.0–15.0)
Immature Granulocytes: 14 %
Lymphocytes Relative: 7 %
Lymphs Abs: 0.9 10*3/uL (ref 0.7–4.0)
MCH: 29 pg (ref 26.0–34.0)
MCHC: 32.4 g/dL (ref 30.0–36.0)
MCV: 89.5 fL (ref 80.0–100.0)
Monocytes Absolute: 0.9 10*3/uL (ref 0.1–1.0)
Monocytes Relative: 7 %
Neutro Abs: 9.5 10*3/uL — ABNORMAL HIGH (ref 1.7–7.7)
Neutrophils Relative %: 72 %
Platelets: 195 10*3/uL (ref 150–400)
RBC: 3.14 MIL/uL — ABNORMAL LOW (ref 3.87–5.11)
RDW: 14.4 % (ref 11.5–15.5)
Smear Review: NORMAL
WBC: 13.2 10*3/uL — ABNORMAL HIGH (ref 4.0–10.5)
nRBC: 0 % (ref 0.0–0.2)

## 2019-11-30 LAB — BASIC METABOLIC PANEL
Anion gap: 8 (ref 5–15)
BUN: 13 mg/dL (ref 8–23)
CO2: 26 mmol/L (ref 22–32)
Calcium: 8.4 mg/dL — ABNORMAL LOW (ref 8.9–10.3)
Chloride: 107 mmol/L (ref 98–111)
Creatinine, Ser: 1.12 mg/dL — ABNORMAL HIGH (ref 0.44–1.00)
GFR calc Af Amer: 58 mL/min — ABNORMAL LOW (ref >60)
GFR calc non Af Amer: 50 mL/min — ABNORMAL LOW (ref >60)
Glucose, Bld: 119 mg/dL — ABNORMAL HIGH (ref 70–99)
Potassium: 4.1 mmol/L (ref 3.5–5.1)
Sodium: 141 mmol/L (ref 135–145)

## 2019-11-30 LAB — SARS CORONAVIRUS 2 (TAT 6-24 HRS): SARS Coronavirus 2: NEGATIVE

## 2019-11-30 NOTE — Telephone Encounter (Signed)
Pt c/o swelling: STAT is pt has developed SOB within 24 hours  1) How much weight have you gained and in what time span? Pt does not weigh herself every day   2) If swelling, where is the swelling located? feet and ankles  3) Are you currently taking a fluid pill? no  4) Are you currently SOB? no  5) Do you have a log of your daily weights (if so, list)? no  6) Have you gained 3 pounds in a day or 5 pounds in a week?   7) Have you traveled recently? No   The patient is worried about the swelling in her feet and ankles with her upcoming heart cath procedure

## 2019-11-30 NOTE — Telephone Encounter (Signed)
I would wait for her to go through cardiac cath before deciding any further intervention

## 2019-11-30 NOTE — Telephone Encounter (Signed)
Call returned to patient and advised of Dr Lambert Keto response. I also advised tht she make cardiology aware of the edema and she has agreed to call them

## 2019-11-30 NOTE — Telephone Encounter (Signed)
Please review note below.  This got sent to the [facility] pool.   Thank you!

## 2019-11-30 NOTE — Telephone Encounter (Signed)
Patient called reporting that she has developed bilateral swelling in her lower extremities from just above the ankles down. She states that she is to have a Heart Cath on Monday and that she has never had this swelling before. She states that she is keeping them elevated and they have gone down some, but not much and that this started yesterday. She is asking what she needs to do about it. Please advise

## 2019-12-01 ENCOUNTER — Other Ambulatory Visit: Payer: Self-pay | Admitting: Family

## 2019-12-01 DIAGNOSIS — R9439 Abnormal result of other cardiovascular function study: Secondary | ICD-10-CM

## 2019-12-01 DIAGNOSIS — I5042 Chronic combined systolic (congestive) and diastolic (congestive) heart failure: Secondary | ICD-10-CM

## 2019-12-01 NOTE — Telephone Encounter (Signed)
Call to patient to discuss recent bilateral swelling in ankles and feet. Does not have current weight.  She reports that swelling is better after rest and elevation.   She denies any SOB or nocturnal dyspnea. Denies inc of salt in diet.  She had been working on her feet when she noticed swelling.   Advised patient to purchase compression hose to wear hen she is working on her feet for prolonged time.   Pt reports drinking electrolyte water. I encouraged her to read the label to ensure it does not have high salt content.   Pt verbalized understanding and is agreeable to POC.

## 2019-12-01 NOTE — Telephone Encounter (Signed)
Orders for cardiac cath placed.  Loel Dubonnet, NP

## 2019-12-04 ENCOUNTER — Other Ambulatory Visit: Payer: Self-pay

## 2019-12-04 ENCOUNTER — Other Ambulatory Visit: Payer: PPO

## 2019-12-04 ENCOUNTER — Ambulatory Visit
Admission: RE | Admit: 2019-12-04 | Discharge: 2019-12-04 | Disposition: A | Discharge status: Home / Self Care | Payer: PPO | Attending: Cardiovascular Disease | Admitting: Cardiovascular Disease

## 2019-12-04 ENCOUNTER — Inpatient Hospital Stay: Payer: PPO

## 2019-12-04 ENCOUNTER — Ambulatory Visit: Payer: PPO

## 2019-12-04 ENCOUNTER — Ambulatory Visit: Payer: PPO | Admitting: Oncology

## 2019-12-04 ENCOUNTER — Encounter: Payer: Self-pay | Admitting: Cardiovascular Disease

## 2019-12-04 ENCOUNTER — Encounter
Admission: RE | Disposition: A | Discharge status: Home / Self Care | Payer: Self-pay | Source: Home / Self Care | Attending: Cardiovascular Disease

## 2019-12-04 DIAGNOSIS — R0609 Other forms of dyspnea: Secondary | ICD-10-CM | POA: Diagnosis not present

## 2019-12-04 DIAGNOSIS — Z87891 Personal history of nicotine dependence: Secondary | ICD-10-CM | POA: Diagnosis not present

## 2019-12-04 DIAGNOSIS — E282 Polycystic ovarian syndrome: Secondary | ICD-10-CM | POA: Diagnosis not present

## 2019-12-04 DIAGNOSIS — I5042 Chronic combined systolic (congestive) and diastolic (congestive) heart failure: Secondary | ICD-10-CM

## 2019-12-04 DIAGNOSIS — Z955 Presence of coronary angioplasty implant and graft: Secondary | ICD-10-CM | POA: Insufficient documentation

## 2019-12-04 DIAGNOSIS — I080 Rheumatic disorders of both mitral and aortic valves: Secondary | ICD-10-CM | POA: Insufficient documentation

## 2019-12-04 DIAGNOSIS — Z8571 Personal history of Hodgkin lymphoma: Secondary | ICD-10-CM | POA: Insufficient documentation

## 2019-12-04 DIAGNOSIS — Z881 Allergy status to other antibiotic agents status: Secondary | ICD-10-CM | POA: Insufficient documentation

## 2019-12-04 DIAGNOSIS — R9439 Abnormal result of other cardiovascular function study: Secondary | ICD-10-CM | POA: Diagnosis not present

## 2019-12-04 DIAGNOSIS — R0600 Dyspnea, unspecified: Secondary | ICD-10-CM

## 2019-12-04 DIAGNOSIS — Z7982 Long term (current) use of aspirin: Secondary | ICD-10-CM | POA: Diagnosis not present

## 2019-12-04 DIAGNOSIS — I5043 Acute on chronic combined systolic (congestive) and diastolic (congestive) heart failure: Secondary | ICD-10-CM

## 2019-12-04 DIAGNOSIS — Z923 Personal history of irradiation: Secondary | ICD-10-CM | POA: Diagnosis not present

## 2019-12-04 DIAGNOSIS — I2510 Atherosclerotic heart disease of native coronary artery without angina pectoris: Secondary | ICD-10-CM | POA: Insufficient documentation

## 2019-12-04 DIAGNOSIS — I509 Heart failure, unspecified: Secondary | ICD-10-CM | POA: Insufficient documentation

## 2019-12-04 DIAGNOSIS — F419 Anxiety disorder, unspecified: Secondary | ICD-10-CM | POA: Insufficient documentation

## 2019-12-04 DIAGNOSIS — E785 Hyperlipidemia, unspecified: Secondary | ICD-10-CM | POA: Diagnosis not present

## 2019-12-04 DIAGNOSIS — Z79899 Other long term (current) drug therapy: Secondary | ICD-10-CM | POA: Insufficient documentation

## 2019-12-04 DIAGNOSIS — K219 Gastro-esophageal reflux disease without esophagitis: Secondary | ICD-10-CM | POA: Insufficient documentation

## 2019-12-04 DIAGNOSIS — C541 Malignant neoplasm of endometrium: Secondary | ICD-10-CM | POA: Insufficient documentation

## 2019-12-04 DIAGNOSIS — Z882 Allergy status to sulfonamides status: Secondary | ICD-10-CM | POA: Diagnosis not present

## 2019-12-04 DIAGNOSIS — I110 Hypertensive heart disease with heart failure: Secondary | ICD-10-CM | POA: Diagnosis not present

## 2019-12-04 HISTORY — PX: RIGHT/LEFT HEART CATH AND CORONARY ANGIOGRAPHY: CATH118266

## 2019-12-04 LAB — CARDIAC CATHETERIZATION: Cath EF Quantitative: 30 %

## 2019-12-04 SURGERY — RIGHT/LEFT HEART CATH AND CORONARY ANGIOGRAPHY
Anesthesia: Moderate Sedation

## 2019-12-04 MED ORDER — MIDAZOLAM HCL 2 MG/2ML IJ SOLN
INTRAMUSCULAR | Status: AC
  Filled 2019-12-04: qty 2

## 2019-12-04 MED ORDER — SODIUM CHLORIDE 0.9 % IV SOLN: INTRAVENOUS | Status: DC

## 2019-12-04 MED ORDER — ASPIRIN 81 MG PO CHEW: 81.0000 mg | CHEWABLE_TABLET | ORAL | Status: DC

## 2019-12-04 MED ORDER — HEPARIN SODIUM (PORCINE) 1000 UNIT/ML IJ SOLN
INTRAMUSCULAR | Status: DC | PRN
  Administered 2019-12-04: 2000 [IU] via INTRAVENOUS

## 2019-12-04 MED ORDER — HEPARIN (PORCINE) IN NACL 1000-0.9 UT/500ML-% IV SOLN
INTRAVENOUS | Status: AC
  Filled 2019-12-04: qty 1000

## 2019-12-04 MED ORDER — FENTANYL CITRATE (PF) 100 MCG/2ML IJ SOLN
INTRAMUSCULAR | Status: AC
  Filled 2019-12-04: qty 2

## 2019-12-04 MED ORDER — HEPARIN SODIUM (PORCINE) 1000 UNIT/ML IJ SOLN
INTRAMUSCULAR | Status: AC
  Filled 2019-12-04: qty 1

## 2019-12-04 MED ORDER — ONDANSETRON HCL 4 MG/2ML IJ SOLN: 4.0000 mg | Freq: Four times a day (QID) | INTRAMUSCULAR | Status: DC | PRN

## 2019-12-04 MED ORDER — LIDOCAINE HCL (PF) 1 % IJ SOLN
INTRAMUSCULAR | Status: AC
  Filled 2019-12-04: qty 30

## 2019-12-04 MED ORDER — SODIUM CHLORIDE 0.9% FLUSH: 3.0000 mL | INTRAVENOUS | Status: DC | PRN

## 2019-12-04 MED ORDER — FENTANYL CITRATE (PF) 100 MCG/2ML IJ SOLN
INTRAMUSCULAR | Status: DC | PRN
  Administered 2019-12-04: 25 ug via INTRAVENOUS

## 2019-12-04 MED ORDER — ACETAMINOPHEN 325 MG PO TABS: 650.0000 mg | ORAL_TABLET | ORAL | Status: DC | PRN

## 2019-12-04 MED ORDER — SODIUM CHLORIDE 0.9 % IV SOLN: 250.0000 mL | INTRAVENOUS | Status: DC | PRN

## 2019-12-04 MED ORDER — SODIUM CHLORIDE 0.9% FLUSH: 3.0000 mL | Freq: Two times a day (BID) | INTRAVENOUS | Status: DC

## 2019-12-04 MED ORDER — IOHEXOL 300 MG/ML  SOLN
INTRAMUSCULAR | Status: DC | PRN
  Administered 2019-12-04: 70 mL

## 2019-12-04 MED ORDER — FUROSEMIDE 20 MG PO TABS
20.0000 mg | ORAL_TABLET | Freq: Every day | ORAL | 3 refills | Status: DC
Start: 2019-12-04 — End: 2019-12-13

## 2019-12-04 MED ORDER — VERAPAMIL HCL 2.5 MG/ML IV SOLN
INTRAVENOUS | Status: DC | PRN
  Administered 2019-12-04: 2.5 mg via INTRA_ARTERIAL

## 2019-12-04 MED ORDER — HEPARIN (PORCINE) IN NACL 1000-0.9 UT/500ML-% IV SOLN
INTRAVENOUS | Status: DC | PRN
  Administered 2019-12-04: 500 mL

## 2019-12-04 MED ORDER — VERAPAMIL HCL 2.5 MG/ML IV SOLN
INTRAVENOUS | Status: AC
  Filled 2019-12-04: qty 2

## 2019-12-04 MED ORDER — MIDAZOLAM HCL 2 MG/2ML IJ SOLN
INTRAMUSCULAR | Status: DC | PRN
  Administered 2019-12-04: 1 mg via INTRAVENOUS

## 2019-12-04 SURGICAL SUPPLY — 11 items
CATH BALLN WEDGE 5F 110CM (CATHETERS) ×2 IMPLANT
CATH INFINITI 5 FR JL3.5 (CATHETERS) ×2 IMPLANT
CATH INFINITI JR4 5F (CATHETERS) ×2 IMPLANT
DEVICE RAD TR BAND REGULAR (VASCULAR PRODUCTS) ×2 IMPLANT
GLIDESHEATH SLEND SS 6F .021 (SHEATH) ×2 IMPLANT
GUIDEWIRE .025 260CM (WIRE) ×2 IMPLANT
GUIDEWIRE INQWIRE 1.5J.035X260 (WIRE) ×1 IMPLANT
INQWIRE 1.5J .035X260CM (WIRE) ×2
KIT MANI 3VAL PERCEP (MISCELLANEOUS) ×2 IMPLANT
PACK CARDIAC CATH (CUSTOM PROCEDURE TRAY) ×2 IMPLANT
SHEATH GLIDE SLENDER 4/5FR (SHEATH) ×2 IMPLANT

## 2019-12-04 NOTE — H&P (Signed)
Office Visit    Patient Name: Jaime Hall Date of Encounter: 11/02/2019  Primary Care Provider:  [name], MD Primary Cardiologist:  Jaime Sacramento, MD Electrophysiologist:  None   Chief Complaint    Jaime Hall is a 69 y.o. female with a hx of CAD, Hodgkin's lymphoma, uterine cancer presents today for follow-up after echo and cardiovascular clearance  Past Medical History        Past Medical History:  Diagnosis Date  . Anxiety   . Cervicalgia   . Coronary artery disease    a. 02/2012 Stress echo: severe anterior wall ischemia;  b. 02/2012 Cath/PCI: LAD 95p (3.0 x 15 Xience EX DES), D1 90ost (PTCA - bifurcational dzs), EF 45% with anterior HK;  b. 02/2013 Ex MV: fixed anterior defect w/ minor reversibility, nl EF-->Med Rx.  . Endometrial cancer ([place])    a. 07/2016 s/p robotic hysterectomy, BSO w/ washings, sentinel node inj, mapping, bx, adhesiolysis.  . Essential hypertension, benign   . Fibrocystic breast disease   . GERD (gastroesophageal reflux disease)   . Gestational hypertension   . Heart murmur   . History of anemia   . History of blood transfusion   . Hodgkin's lymphoma ([place]) 2011   a. s/p radiation and chemo therapy  . Osteoarthritis   . Polycystic ovarian disease         Past Surgical History:  Procedure Laterality Date  . ABDOMINAL HYSTERECTOMY    . bladder sling    . CARDIAC CATHETERIZATION  02/2012   [facility] 1 stent place  . CERVICAL POLYPECTOMY    . CHOLECYSTECTOMY  1982  . COLONOSCOPY WITH PROPOFOL N/A 02/05/2015   Procedure: COLONOSCOPY WITH PROPOFOL;  Surgeon: Jaime Lame, MD;  Location: [facility];  Service: Endoscopy;  Laterality: N/A;  . CORONARY ANGIOPLASTY  02/2012   left/right s/p balloon  . CYSTOGRAM N/A 08/17/2016   Procedure: CYSTOGRAM;  Surgeon: Jaime Espy, MD;  Location: [facility];  Service: Urology;  Laterality: N/A;  . CYSTOSCOPY N/A 08/17/2016   Procedure: CYSTOSCOPY EXAM UNDER  ANESTHESIA;  Surgeon: Jaime Espy, MD;  Location: [facility];  Service: Urology;  Laterality: N/A;  . CYSTOSCOPY W/ RETROGRADES Bilateral 08/17/2016   Procedure: CYSTOSCOPY WITH RETROGRADE PYELOGRAM;  Surgeon: Jaime Espy, MD;  Location: [facility];  Service: Urology;  Laterality: Bilateral;  . CYSTOSCOPY WITH STENT PLACEMENT Right 08/17/2016   Procedure: CYSTOSCOPY WITH STENT PLACEMENT;  Surgeon: Jaime Espy, MD;  Location: [facility];  Service: Urology;  Laterality: Right;  . heart stent'  2013  . kidney stent Right 2018  . LYMPH NODE BIOPSY  2011   diagnosis of hodgkins lymphoma  . PELVIC LYMPH NODE DISSECTION N/A 07/29/2016   Procedure: PELVIC/AORTIC LYMPH NODE SAMPLING;  Surgeon: Jaime Ends, MD;  Location: [facility];  Service: Gynecology;  Laterality: N/A;  . PORTA CATH INSERTION N/A 09/22/2016   Procedure: Glori Luis Cath Insertion;  Surgeon: Jaime Cabal, MD;  Location: [place] [facility];  Service: Cardiovascular;  Laterality: N/A;  . PORTA CATH INSERTION N/A 10/27/2019   Procedure: PORTA CATH INSERTION;  Surgeon: Jaime Cabal, MD;  Location: [place] [facility];  Service: Cardiovascular;  Laterality: N/A;  . PORTA CATH REMOVAL N/A 11/17/2016   Procedure: Glori Luis Cath Removal;  Surgeon: Jaime Cabal, MD;  Location: [place] [facility];  Service: Cardiovascular;  Laterality: N/A;  . ROBOTIC ASSISTED TOTAL HYSTERECTOMY WITH BILATERAL SALPINGO OOPHERECTOMY N/A 07/29/2016   Procedure: ROBOTIC ASSISTED TOTAL HYSTERECTOMY WITH  BILATERAL SALPINGO OOPHORECTOMY;  Surgeon: Jaime Ends, MD;  Location: [facility];  Service: Gynecology;  Laterality: N/A;  . SENTINEL NODE BIOPSY N/A 07/29/2016   Procedure: SENTINEL NODE BIOPSY;  Surgeon: Jaime Ends, MD;  Location: [facility];  Service: Gynecology;  Laterality: N/A;  . transobturator sling N/A 2009   [place]    Allergies       Allergies  Allergen Reactions  . Adhesive [Tape] Other (See  Comments)    Burns the skin  . Z-Pak [Azithromycin] Itching  . Antifungal [Miconazole Nitrate] Rash  . Sulfa Antibiotics Nausea And Vomiting and Rash    N/T    History of Present Illness    Jaime Hall is a 69 y.o. female with a hx of CAD, Hodgkin's lymphoma, uterine cancer. She was last seen by Jaime Hall in November 2020.  History of Hodgkin's lymphoma treated with radiation and chemotherapy to the chest area previously in remission since 2011.  Uterine cancer in 2018 treated with surgery and chemotherapy.  She is a former smoker.  Her CAD is s/p angioplasty and DES to proximal LAD with balloon angioplasty to the diagonal September 2013.  Treadmill nuclear stress test September 2014 fixed anterior wall defect with minor reversibility and normal LVEF.   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 She had portacath placed 10/27/19.   Works as a [employer] manager. Works six hour shifts five days per week. Notes some LE edema at end of shift, but resolves by morning. This only occurs in her left leg where lymph nodes were previously removed.   When asked about her CAD hx shares that she had routine stress test after her first round of chemo back in 2013 and was taken off the treadmill due to changes. She was asymptomatic at the time.   Previous chemotherapy with 8 rounds of Adriamycin with her Hodgkin's lymphoma ten years ago.   Has had inner ear virus in past and tells me she will have an "occasional spin" or dizzy sensation but not often. Will occasionally get lightheaded which she attributes to her BP dropping with Cymbalta and Metoprolol. Does try to make position changes carefully.   She has some fused cells in her chest and she will occasionally get a burning sensation which has been happening since her first radiation. No chest pain with exertion.   No shortness of breath at rest.  She does note dyspnea on exertion if she does an activity like walk around the block or get in a hurry or if she is anxious. She does use diazepam as needed, but does not have to use frequently. No chest tightness associated with her shortness of breath. She has never had to use her nitroglycerin.   We reviewed her echocardiogram performed this morning. Showed new onset systolic and diastolic heart failure with LVEF 30-35%, gr1DD, RV normal size and function, mildly elevated PASP, mild to moderate MR/AI.   She received a notice at the end of our visit from her oncologist Dr. Janese Banks that they would be switching chemotherapy agents due to her newly reduced EF.  EKGs/Labs/Other Studies Reviewed:   The following studies were reviewed today: Echo 11/02/19 IMPRESSIONS    1. Left ventricular ejection fraction, by estimation, is 30 to 35%. The  left ventricle has moderately decreased function. The left ventricle  demonstrates global hypokinesis. Left ventricular diastolic parameters are  consistent with Grade I diastolic  dysfunction (impaired relaxation).  2. Right ventricular systolic function is normal. The right ventricular  size is normal. There is mildly elevated pulmonary artery systolic  pressure. The estimated right ventricular systolic pressure is 66.0 mmHg.  3. Mild to moderate mitral valve regurgitation.  4. Aortic valve regurgitation is mild to moderate.   EKG:  EKG is ordered today.  The ekg ordered today demonstrates NSR 85 bpm with incomplete LBBB and no acute ST/T wave changes.   Recent Labs: 10/02/2019: ALT 10 10/24/2019: BUN 33; Creatinine, Ser 1.36; Hemoglobin 12.1; Platelets 241; Potassium 5.1; Sodium 139  Recent Lipid Panel Labs (Brief)          Component Value Date/Time   CHOL 121 12/27/2018 1436   CHOL 110 04/11/2012 0835   TRIG 71 12/27/2018 1436   HDL 61 12/27/2018 1436   HDL 56 04/11/2012 0835   CHOLHDL 2.0 12/27/2018 1436   VLDL 14 12/27/2018 1436    LDLCALC 46 12/27/2018 1436   LDLCALC 41 04/11/2012 0835      Home Medications   Active Medications      Current Meds  Medication Sig  . aspirin EC 81 MG tablet Take 81 mg by mouth at bedtime.  Marland Kitchen atorvastatin (LIPITOR) 10 MG tablet Take 1 tablet by mouth once daily (Patient taking differently: Take 10 mg by mouth every evening. )  . cholecalciferol (VITAMIN D3) 25 MCG (1000 UNIT) tablet Take 1,000 Units by mouth in the morning and at bedtime.  . cyanocobalamin (,VITAMIN B-12,) 1000 MCG/ML injection INJECT 1ML IM WEEKLY FOR 4 WEEKS, THEN INJECT MONTHLY THEREAFTER (Patient taking differently: Inject 1,000 mcg into the skin every 30 (thirty) days. )  . diazepam (VALIUM) 5 MG tablet Take 1 tablet (5 mg total) by mouth every 12 (twelve) hours as needed for anxiety. (Patient taking differently: Take 2.5 mg by mouth every 12 (twelve) hours as needed for anxiety. )  . DM-APAP-CPM (CORICIDIN HBP PO) Take 1 tablet by mouth daily as needed (allergies).  . DULoxetine (CYMBALTA) 30 MG capsule Take 1 capsule by mouth once daily (Patient taking differently: Take 30 mg by mouth daily. )  . esomeprazole (NEXIUM) 20 MG capsule Take 20 mg by mouth every other day.  . lidocaine-prilocaine (EMLA) cream Apply to affected area once (Patient taking differently: Apply to affected area during port access)  . metoprolol tartrate (LOPRESSOR) 25 MG tablet Take 1 tablet by mouth twice daily (Patient taking differently: Take 25 mg by mouth 2 (two) times daily. )  . nitroGLYCERIN (NITROSTAT) 0.4 MG SL tablet Place 1 tablet (0.4 mg total) under the tongue every 5 (five) minutes as needed for chest pain.  Marland Kitchen nystatin (MYCOSTATIN/NYSTOP) powder Apply 1 application topically 2 (two) times daily. To rash until resolved.  . Syringe/Needle, Disp, (SYRINGE 3CC/25GX1") 25G X 1" 3 ML MISC Use for b12 injections  . Turmeric 500 MG CAPS Take 500 mg by mouth daily.         Review of Systems       Review of Systems   Constitution: Negative for chills, fever and malaise/fatigue.  Cardiovascular: Positive for dyspnea on exertion (with more than usual activity) and leg swelling (left leg only post lymph node removal). Negative for chest pain, near-syncope, orthopnea, palpitations and syncope.  Respiratory: Negative for cough, shortness of breath and wheezing.   Gastrointestinal: Negative for nausea and vomiting.  Neurological: Positive for light-headedness. Negative for dizziness and weakness.   All other systems reviewed and are otherwise negative except as  noted above.  Physical Exam    VS:  BP 110/82 (BP Location: Left Arm, Patient Position: Sitting, Cuff Size: Normal)   Pulse 85   Ht 5' 1.75" (1.568 m)   Wt 163 lb 2 oz (74 kg)   SpO2 98%   BMI 30.08 kg/m  , BMI Body mass index is 30.08 kg/m. GEN: Well nourished, well developed, in no acute distress. HEENT: normal. Neck: Supple, no JVD, carotid bruits, or masses. Cardiac: RRR, no murmurs, rubs, or gallops. No clubbing, cyanosis, edema.  Radials/DP/PT 2+ and equal bilaterally.  Respiratory:  Respirations regular and unlabored, clear to auscultation bilaterally. GI: Soft, nontender, nondistended, BS + x 4. MS: No deformity or atrophy. Skin: Warm and dry, no rash. Neuro:  Strength and sensation are intact. Psych: Normal affect.  Assessment & Plan    1. Cardiac clearance for chemotherapy - Echo today with newly noted LVEF 30-35%. CAD with no symptoms concerning for angina. Plan for Lexiscan to rule out ischemia as origin for reduced EF though suspect it is from previous Adriamycin usage. This does not need to be performed prior to her chemotherapy. She is clear to begin chemotherapy Monday. Recommend avoidance of cardiotoxic agents. Her oncologist Dr. Janese Banks is aware of echo results and has opted to change her chemo regimen.  2. CAD - Remote stent LAD 2013 with low risk myoview 2014. No symptoms concerning for angina. Reports only mild DOE with  more than usual activity such as walking around the block. EKG today no acute changes. In setting of newly reduced EF, plan for Lexiscan Myoview to rule out ischemia as cause. This does not need to be performed prior to starting chemotherapy. This plan of care was discussed with Jaime Hall in the office today. Continue GDMT including aspirin, beta blocker, statin.   3. HLD, HLD goal <70 - Continue Atorvastatin 10mg  daily.   4. HTN - BP low normal today. Reports intermittently lightheadedness. Recommend making position changes carefully and compression stockings. Continue Metoprolol 25mg  BID.    5. HFrEF - New diagnosis reviewed in depth today. Echo as part of clearance for chemo revealed LVEF 30-35% and gr1DD. NYHA I-II with only mild DOE. No volume overload on exam. Addition of HF therapies limited by low BP and lightheadedness. Continue Toprol. Educated on low sodium diet and limiting to 2L fluid. Plan for Lexiscan Myoview to rule out ischemia as origin. Anticipate previous Adriamycin use is cause.   6. Valvular heart disease - Mild to moderate MR/AI by echo. Continue optimal BP and volume control. Anticipate monitoring with periodic echo.   Disposition: Follow up in 4 week(s) with Jaime Hall.    Loel Dubonnet, NP 11/02/2019, 10:27 PM   Addendum on December 04, 2019.  Nuclear stress test showed large anterior wall ischemia and high risk stress test.  Thus, the patient is scheduled for a right and left cardiac catheterization. No significant change in physical exam and the patient has no questions about the procedure.

## 2019-12-04 NOTE — Progress Notes (Signed)
Attempted to access port twice with no success. Patient even guided to where they normally stick with success and was unable to access. Per patient the port has been swollen, painful, and not wanting to heal but they have used it twice for her chemo. Informed patient to share her concerns with her oncologist and inform them that I could not access the port.   Vaughan Sine, RN SPR 12/04/2019

## 2019-12-04 NOTE — Discharge Instructions (Signed)
Furosemide Oral Tablets What is this medicine? FUROSEMIDE (fyoor [place] se mide) is a diuretic. It helps you make more urine and to lose salt and excess water from your body. It treats swelling from heart, kidney, or liver disease. It also treats high blood pressure. This medicine may be used for other purposes; ask your health care provider or pharmacist if you have questions. COMMON BRAND NAME(S): Active-Medicated Specimen Kit, Delone, Diuscreen, Lasix, RX Specimen Collection Kit, Specimen Collection Kit, URINX Medicated Specimen Collection What should I tell my health care provider before I take this medicine? They need to know if you have any of these conditions:  abnormal blood electrolytes  diarrhea or vomiting  gout  heart disease  kidney disease, small amounts of urine, or difficulty passing urine  liver disease  thyroid disease  an unusual or allergic reaction to furosemide, sulfa drugs, other medicines, foods, dyes, or preservatives  pregnant or trying to get pregnant  breast-feeding How should I use this medicine? Take this drug by mouth. Take it as directed on the prescription label at the same time every day. You can take it with or without food. If it upsets your stomach, take it with food. Keep taking it unless your health care provider tells you to stop. Talk to your health care provider about the use of this drug in children. Special care may be needed. Overdosage: If you think you have taken too much of this medicine contact a poison control center or emergency room at once. NOTE: This medicine is only for you. Do not share this medicine with others. What if I miss a dose? If you miss a dose, take it as soon as you can. If it is almost time for your next dose, take only that dose. Do not take double or extra doses. What may interact with this medicine?  aspirin and aspirin-like medicines  certain antibiotics  chloral  hydrate  cisplatin  cyclosporine  digoxin  diuretics  laxatives  lithium  medicines for blood pressure  medicines that relax muscles for surgery  methotrexate  NSAIDs, medicines for pain and inflammation like ibuprofen, naproxen, or indomethacin  phenytoin  steroid medicines like prednisone or cortisone  sucralfate  thyroid hormones This list may not describe all possible interactions. Give your health care provider a list of all the medicines, herbs, non-prescription drugs, or dietary supplements you use. Also tell them if you smoke, drink alcohol, or use illegal drugs. Some items may interact with your medicine. What should I watch for while using this medicine? Visit your doctor or health care provider for regular checks on your progress. Check your blood pressure regularly. Ask your doctor or health care provider what your blood pressure should be, and when you should contact him or her. If you are a diabetic, check your blood sugar as directed. This medicine may cause serious skin reactions. They can happen weeks to months after starting the medicine. Contact your health care provider right away if you notice fevers or flu-like symptoms with a rash. The rash may be red or purple and then turn into blisters or peeling of the skin. Or, you might notice a red rash with swelling of the face, lips or lymph nodes in your neck or under your arms. You may need to be on a special diet while taking this medicine. Check with your doctor. Also, ask how many glasses of fluid you need to drink a day. You must not get dehydrated. You may get drowsy or  dizzy. Do not drive, use machinery, or do anything that needs mental alertness until you know how this drug affects you. Do not stand or sit up quickly, especially if you are an older patient. This reduces the risk of dizzy or fainting spells. Alcohol can make you more drowsy and dizzy. Avoid alcoholic drinks. This medicine can make you more  sensitive to the sun. Keep out of the sun. If you cannot avoid being in the sun, wear protective clothing and use sunscreen. Do not use sun lamps or tanning beds/booths. What side effects may I notice from receiving this medicine? Side effects that you should report to your doctor or health care professional as soon as possible:  blood in urine or stools  dry mouth  fever or chills  hearing loss or ringing in the ears  irregular heartbeat  muscle pain or weakness, cramps  rash, fever, and swollen lymph nodes  redness, blistering, peeling or loosening of the skin, including inside the mouth  skin rash  stomach upset, pain, or nausea  tingling or numbness in the hands or feet  unusually weak or tired  vomiting or diarrhea  yellowing of the eyes or skin Side effects that usually do not require medical attention (report to your doctor or health care professional if they continue or are bothersome):  headache  loss of appetite  unusual bleeding or bruising This list may not describe all possible side effects. Call your doctor for medical advice about side effects. You may report side effects to FDA at 1-800-FDA-1088. Where should I keep my medicine? Keep out of the reach of children and pets. Store at room temperature between 20 and 25 degrees C (68 and 77 degrees F). Protect from light and moisture. Keep the container tightly closed. Throw away any unused drug after the expiration date. NOTE: This sheet is a summary. It may not cover all possible information. If you have questions about this medicine, talk to your doctor, pharmacist, or health care provider.  2020 Elsevier/Gold Standard (2019-01-17 18:01:32)    Angiogram, Care After This sheet gives you information about how to care for yourself after your procedure. Your doctor may also give you more specific instructions. If you have problems or questions, contact your doctor. Follow these instructions at  home: Insertion site care  Follow instructions from your doctor about how to take care of your long, thin tube (catheter) insertion area. Make sure you: ? Wash your hands with soap and water before you change your bandage (dressing). If you cannot use soap and water, use hand sanitizer. ? Change your bandage as told by your doctor. ? Leave stitches (sutures), skin glue, or skin tape (adhesive) strips in place. They may need to stay in place for 2 weeks or longer. If tape strips get loose and curl up, you may trim the loose edges. Do not remove tape strips completely unless your doctor says it is okay.  Do not take baths, swim, or use a hot tub until your doctor says it is okay.  You may shower 24-48 hours after the procedure or as told by your doctor. ? Gently wash the area with plain soap and water. ? Pat the area dry with a clean towel. ? Do not rub the area. This may cause bleeding.  Do not apply powder or lotion to the area. Keep the area clean and dry.  Check your insertion area every day for signs of infection. Check for: ? More redness, swelling, or pain. ?  Fluid or blood. ? Warmth. ? Pus or a bad smell. Activity  Rest as told by your doctor, usually for 1-2 days.  Do not lift anything that is heavier than 10 lbs. (4.5 kg) or as told by your doctor.  Do not drive for 24 hours if you were given a medicine to help you relax (sedative).  Do not drive or use heavy machinery while taking prescription pain medicine. General instructions   Go back to your normal activities as told by your doctor, usually in about a week. Ask your doctor what activities are safe for you.  If the insertion area starts to bleed, lie flat and put pressure on the area. If the bleeding does not stop, get help right away. This is an emergency.  Drink enough fluid to keep your pee (urine) clear or pale yellow.  Take over-the-counter and prescription medicines only as told by your doctor.  Keep all  follow-up visits as told by your doctor. This is important. Contact a doctor if:  You have a fever.  You have chills.  You have more redness, swelling, or pain around your insertion area.  You have fluid or blood coming from your insertion area.  The insertion area feels warm to the touch.  You have pus or a bad smell coming from your insertion area.  You have more bruising around the insertion area.  Blood collects in the tissue around the insertion area (hematoma) that may be painful to the touch. Get help right away if:  You have a lot of pain in the insertion area.  The insertion area swells very fast.  The insertion area is bleeding, and the bleeding does not stop after holding steady pressure on the area.  The area near or just beyond the insertion area becomes pale, cool, tingly, or numb. These symptoms may be an emergency. Do not wait to see if the symptoms will go away. Get medical help right away. Call your local emergency services (911 in the [place] Do not drive yourself to the hospital. Summary  After the procedure, it is common to have bruising and tenderness at the long, thin tube insertion area.  After the procedure, it is important to rest and drink plenty of fluids.  Do not take baths, swim, or use a hot tub until your doctor says it is okay to do so. You may shower 24-48 hours after the procedure or as told by your doctor.  If the insertion area starts to bleed, lie flat and put pressure on the area. If the bleeding does not stop, get help right away. This is an emergency. This information is not intended to replace advice given to you by your health care provider. Make sure you discuss any questions you have with your health care provider. Document Revised: 05/14/2017 Document Reviewed: 05/26/2016 Elsevier Patient Education  2020 [place]  This sheet gives you information about how to care for yourself after your procedure. Your  health care provider may also give you more specific instructions. If you have problems or questions, contact your health care provider. What can I expect after the procedure? After the procedure, it is common to have:  Bruising and tenderness at the catheter insertion area. Follow these instructions at home: Medicines  Take over-the-counter and prescription medicines only as told by your health care provider. Insertion site care  Follow instructions from your health care provider about how to take care of your insertion site.  Make sure you: ? Wash your hands with soap and water before you change your bandage (dressing). If soap and water are not available, use hand sanitizer. ? Change your dressing as told by your health care provider. ? Leave stitches (sutures), skin glue, or adhesive strips in place. These skin closures may need to stay in place for 2 weeks or longer. If adhesive strip edges start to loosen and curl up, you may trim the loose edges. Do not remove adhesive strips completely unless your health care provider tells you to do that.  Check your insertion site every day for signs of infection. Check for: ? Redness, swelling, or pain. ? Fluid or blood. ? Pus or a bad smell. ? Warmth.  Do not take baths, swim, or use a hot tub until your health care provider approves.  You may shower 24-48 hours after the procedure, or as directed by your health care provider. ? Remove the dressing and gently wash the site with plain soap and water. ? Pat the area dry with a clean towel. ? Do not rub the site. That could cause bleeding.  Do not apply powder or lotion to the site. Activity   For 24 hours after the procedure, or as directed by your health care provider: ? Do not flex or bend the affected arm. ? Do not push or pull heavy objects with the affected arm. ? Do not drive yourself home from the hospital or clinic. You may drive 24 hours after the procedure unless your health  care provider tells you not to. ? Do not operate machinery or power tools.  Do not lift anything that is heavier than 10 lb (4.5 kg), or the limit that you are told, until your health care provider says that it is safe.  Ask your health care provider when it is okay to: ? Return to work or school. ? Resume usual physical activities or sports. ? Resume sexual activity. General instructions  If the catheter site starts to bleed, raise your arm and put firm pressure on the site. If the bleeding does not stop, get help right away. This is a medical emergency.  If you went home on the same day as your procedure, a responsible adult should be with you for the first 24 hours after you arrive home.  Keep all follow-up visits as told by your health care provider. This is important. Contact a health care provider if:  You have a fever.  You have redness, swelling, or yellow drainage around your insertion site. Get help right away if:  You have unusual pain at the radial site.  The catheter insertion area swells very fast.  The insertion area is bleeding, and the bleeding does not stop when you hold steady pressure on the area.  Your arm or hand becomes pale, cool, tingly, or numb. These symptoms may represent a serious problem that is an emergency. Do not wait to see if the symptoms will go away. Get medical help right away. Call your local emergency services (911 in the [place] Do not drive yourself to the hospital. Summary  After the procedure, it is common to have bruising and tenderness at the site.  Follow instructions from your health care provider about how to take care of your radial site wound. Check the wound every day for signs of infection.  Do not lift anything that is heavier than 10 lb (4.5 kg), or the limit that you are told, until your health care provider  says that it is safe. This information is not intended to replace advice given to you by your health care provider.  Make sure you discuss any questions you have with your health care provider. Document Revised: 07/07/2017 Document Reviewed: 07/07/2017 Elsevier Patient Education  [place].   Moderate Conscious Sedation, Adult, Care After These instructions provide you with information about caring for yourself after your procedure. Your health care provider may also give you more specific instructions. Your treatment has been planned according to current medical practices, but problems sometimes occur. Call your health care provider if you have any problems or questions after your procedure. What can I expect after the procedure? After your procedure, it is common:  To feel sleepy for several hours.  To feel clumsy and have poor balance for several hours.  To have poor judgment for several hours.  To vomit if you eat too soon. Follow these instructions at home: For at least 24 hours after the procedure:   Do not: ? Participate in activities where you could fall or become injured. ? Drive. ? Use heavy machinery. ? Drink alcohol. ? Take sleeping pills or medicines that cause drowsiness. ? Make important decisions or sign legal documents. ? Take care of children on your own.  Rest. Eating and drinking  Follow the diet recommended by your health care provider.  If you vomit: ? Drink water, juice, or soup when you can drink without vomiting. ? Make sure you have little or no nausea before eating solid foods. General instructions  Have a responsible adult stay with you until you are awake and alert.  Take over-the-counter and prescription medicines only as told by your health care provider.  If you smoke, do not smoke without supervision.  Keep all follow-up visits as told by your health care provider. This is important. Contact a health care provider if:  You keep feeling nauseous or you keep vomiting.  You feel light-headed.  You develop a rash.  You have a fever. Get  help right away if:  You have trouble breathing. This information is not intended to replace advice given to you by your health care provider. Make sure you discuss any questions you have with your health care provider. Document Revised: 05/14/2017 Document Reviewed: 09/21/2015 Elsevier Patient Education  2020 [employer].

## 2019-12-07 ENCOUNTER — Inpatient Hospital Stay: Payer: PPO

## 2019-12-07 ENCOUNTER — Other Ambulatory Visit: Payer: Self-pay

## 2019-12-07 VITALS — BP 121/53 | HR 71 | Temp 96.8°F | Wt 160.2 lb

## 2019-12-07 DIAGNOSIS — C786 Secondary malignant neoplasm of retroperitoneum and peritoneum: Secondary | ICD-10-CM

## 2019-12-07 DIAGNOSIS — Z5111 Encounter for antineoplastic chemotherapy: Secondary | ICD-10-CM | POA: Diagnosis not present

## 2019-12-07 DIAGNOSIS — C541 Malignant neoplasm of endometrium: Secondary | ICD-10-CM

## 2019-12-07 DIAGNOSIS — D6959 Other secondary thrombocytopenia: Secondary | ICD-10-CM

## 2019-12-07 DIAGNOSIS — T451X5A Adverse effect of antineoplastic and immunosuppressive drugs, initial encounter: Secondary | ICD-10-CM

## 2019-12-07 DIAGNOSIS — Z95828 Presence of other vascular implants and grafts: Secondary | ICD-10-CM

## 2019-12-07 LAB — CBC WITH DIFFERENTIAL/PLATELET
Abs Immature Granulocytes: 0.07 10*3/uL (ref 0.00–0.07)
Basophils Absolute: 0.1 10*3/uL (ref 0.0–0.1)
Basophils Relative: 1 %
Eosinophils Absolute: 0 10*3/uL (ref 0.0–0.5)
Eosinophils Relative: 0 %
HCT: 32.6 % — ABNORMAL LOW (ref 36.0–46.0)
Hemoglobin: 10.5 g/dL — ABNORMAL LOW (ref 12.0–15.0)
Immature Granulocytes: 1 %
Lymphocytes Relative: 11 %
Lymphs Abs: 0.7 10*3/uL (ref 0.7–4.0)
MCH: 28.8 pg (ref 26.0–34.0)
MCHC: 32.2 g/dL (ref 30.0–36.0)
MCV: 89.6 fL (ref 80.0–100.0)
Monocytes Absolute: 0.8 10*3/uL (ref 0.1–1.0)
Monocytes Relative: 11 %
Neutro Abs: 5 10*3/uL (ref 1.7–7.7)
Neutrophils Relative %: 76 %
Platelets: 392 10*3/uL (ref 150–400)
RBC: 3.64 MIL/uL — ABNORMAL LOW (ref 3.87–5.11)
RDW: 16.8 % — ABNORMAL HIGH (ref 11.5–15.5)
WBC: 6.7 10*3/uL (ref 4.0–10.5)
nRBC: 0 % (ref 0.0–0.2)

## 2019-12-07 LAB — IRON AND TIBC
Iron: 57 ug/dL (ref 28–170)
Saturation Ratios: 16 % (ref 10.4–31.8)
TIBC: 364 ug/dL (ref 250–450)
UIBC: 307 ug/dL

## 2019-12-07 LAB — COMPREHENSIVE METABOLIC PANEL
ALT: 31 U/L (ref 0–44)
AST: 36 U/L (ref 15–41)
Albumin: 3.7 g/dL (ref 3.5–5.0)
Alkaline Phosphatase: 99 U/L (ref 38–126)
Anion gap: 10 (ref 5–15)
BUN: 18 mg/dL (ref 8–23)
CO2: 27 mmol/L (ref 22–32)
Calcium: 8.8 mg/dL — ABNORMAL LOW (ref 8.9–10.3)
Chloride: 103 mmol/L (ref 98–111)
Creatinine, Ser: 1.22 mg/dL — ABNORMAL HIGH (ref 0.44–1.00)
GFR calc Af Amer: 52 mL/min — ABNORMAL LOW (ref >60)
GFR calc non Af Amer: 45 mL/min — ABNORMAL LOW (ref >60)
Glucose, Bld: 106 mg/dL — ABNORMAL HIGH (ref 70–99)
Potassium: 4.1 mmol/L (ref 3.5–5.1)
Sodium: 140 mmol/L (ref 135–145)
Total Bilirubin: 1 mg/dL (ref 0.3–1.2)
Total Protein: 6.8 g/dL (ref 6.5–8.1)

## 2019-12-07 LAB — VITAMIN B12: Vitamin B-12: 6613 pg/mL — ABNORMAL HIGH (ref 180–914)

## 2019-12-07 LAB — FOLATE: Folate: 15.1 ng/mL (ref >5.9)

## 2019-12-07 LAB — RETICULOCYTES
Immature Retic Fract: 18.3 % — ABNORMAL HIGH (ref 2.3–15.9)
RBC.: 3.56 MIL/uL — ABNORMAL LOW (ref 3.87–5.11)
Retic Count, Absolute: 145.2 10*3/uL (ref 19.0–186.0)
Retic Ct Pct: 4.1 % — ABNORMAL HIGH (ref 0.4–3.1)

## 2019-12-07 LAB — FERRITIN: Ferritin: 154 ng/mL (ref 11–307)

## 2019-12-07 MED ORDER — SODIUM CHLORIDE 0.9 % IV SOLN
112.9500 mg | Freq: Once | INTRAVENOUS | Status: AC
  Administered 2019-12-07: 110 mg via INTRAVENOUS
  Filled 2019-12-07: qty 11

## 2019-12-07 MED ORDER — HEPARIN SOD (PORK) LOCK FLUSH 100 UNIT/ML IV SOLN
INTRAVENOUS | Status: AC
  Filled 2019-12-07: qty 5

## 2019-12-07 MED ORDER — SODIUM CHLORIDE 0.9 % IV SOLN
INTRAVENOUS | Status: DC
  Filled 2019-12-07: qty 250

## 2019-12-07 MED ORDER — SODIUM CHLORIDE 0.9 % IV SOLN
1400.0000 mg | Freq: Once | INTRAVENOUS | Status: AC
  Administered 2019-12-07: 1400 mg via INTRAVENOUS
  Filled 2019-12-07: qty 26.3

## 2019-12-07 MED ORDER — PALONOSETRON HCL INJECTION 0.25 MG/5ML
0.2500 mg | Freq: Once | INTRAVENOUS | Status: AC
  Administered 2019-12-07: 0.25 mg via INTRAVENOUS
  Filled 2019-12-07: qty 5

## 2019-12-07 MED ORDER — HEPARIN SOD (PORK) LOCK FLUSH 100 UNIT/ML IV SOLN
500.0000 [IU] | Freq: Once | INTRAVENOUS | Status: AC | PRN
  Administered 2019-12-07: 500 [IU]
  Filled 2019-12-07: qty 5

## 2019-12-07 MED ORDER — SODIUM CHLORIDE 0.9% FLUSH
10.0000 mL | INTRAVENOUS | Status: AC | PRN
  Administered 2019-12-07: 10 mL via INTRAVENOUS
  Filled 2019-12-07: qty 10

## 2019-12-07 MED ORDER — SODIUM CHLORIDE 0.9 % IV SOLN
10.0000 mg | Freq: Once | INTRAVENOUS | Status: AC
  Administered 2019-12-07: 10 mg via INTRAVENOUS
  Filled 2019-12-07: qty 10

## 2019-12-08 ENCOUNTER — Encounter: Payer: Self-pay | Admitting: Family

## 2019-12-11 ENCOUNTER — Ambulatory Visit: Payer: PPO | Admitting: Oncology

## 2019-12-11 ENCOUNTER — Ambulatory Visit: Payer: PPO

## 2019-12-11 ENCOUNTER — Other Ambulatory Visit: Payer: PPO

## 2019-12-13 ENCOUNTER — Ambulatory Visit: Payer: PPO | Admitting: Family

## 2019-12-13 ENCOUNTER — Encounter: Payer: Self-pay | Admitting: Family

## 2019-12-13 ENCOUNTER — Other Ambulatory Visit: Payer: Self-pay

## 2019-12-13 VITALS — BP 100/64 | HR 72 | Ht 61.0 in | Wt 159.5 lb

## 2019-12-13 DIAGNOSIS — I2510 Atherosclerotic heart disease of native coronary artery without angina pectoris: Secondary | ICD-10-CM | POA: Diagnosis not present

## 2019-12-13 DIAGNOSIS — I10 Essential (primary) hypertension: Secondary | ICD-10-CM | POA: Diagnosis not present

## 2019-12-13 DIAGNOSIS — E785 Hyperlipidemia, unspecified: Secondary | ICD-10-CM | POA: Diagnosis not present

## 2019-12-13 DIAGNOSIS — I5042 Chronic combined systolic (congestive) and diastolic (congestive) heart failure: Secondary | ICD-10-CM

## 2019-12-13 MED ORDER — FUROSEMIDE 20 MG PO TABS
ORAL_TABLET | ORAL | Status: DC
Start: 2019-12-13 — End: 2019-12-20

## 2019-12-13 NOTE — Progress Notes (Signed)
Office Visit    Patient Name: Jaime Hall Date of Encounter: 12/13/2019  Primary Care Provider:  [name], MD Primary Cardiologist:  Jaime Sacramento, MD Electrophysiologist:  None   Chief Complaint    Jaime Hall is a 69 y.o. female with a hx of CAD, Hodgkin's lymphoma, uterine cancer presents today for follow-up after cardiac catheterization  Past Medical History    Past Medical History:  Diagnosis Date  . Anxiety   . Cervicalgia   . Coronary artery disease    a. 02/2012 Stress echo: severe anterior wall ischemia;  b. 02/2012 Cath/PCI: LAD 95p (3.0 x 15 Xience EX DES), D1 90ost (PTCA - bifurcational dzs), EF 45% with anterior HK;  b. 02/2013 Ex MV: fixed anterior defect w/ minor reversibility, nl EF-->Med Rx.  . Endometrial cancer ([place])    a. 07/2016 s/p robotic hysterectomy, BSO w/ washings, sentinel node inj, mapping, bx, adhesiolysis.  . Essential hypertension, benign   . Fibrocystic breast disease   . GERD (gastroesophageal reflux disease)   . Gestational hypertension   . Heart murmur   . History of anemia   . History of blood transfusion   . Hodgkin's lymphoma ([place]) 2011   a. s/p radiation and chemo therapy  . Osteoarthritis   . Polycystic ovarian disease    Past Surgical History:  Procedure Laterality Date  . ABDOMINAL HYSTERECTOMY    . bladder sling    . CARDIAC CATHETERIZATION  02/2012   [facility] 1 stent place  . CERVICAL POLYPECTOMY    . CHOLECYSTECTOMY  1982  . COLONOSCOPY WITH PROPOFOL N/A 02/05/2015   Procedure: COLONOSCOPY WITH PROPOFOL;  Surgeon: Jaime Lame, MD;  Location: [facility] ENDOSCOPY;  Service: Endoscopy;  Laterality: N/A;  . CORONARY ANGIOPLASTY  02/2012   left/right s/p balloon  . CYSTOGRAM N/A 08/17/2016   Procedure: CYSTOGRAM;  Surgeon: Jaime Espy, MD;  Location: [facility] ORS;  Service: Urology;  Laterality: N/A;  . CYSTOSCOPY N/A 08/17/2016   Procedure: CYSTOSCOPY EXAM UNDER ANESTHESIA;  Surgeon: Jaime Espy, MD;  Location: [facility]  ORS;  Service: Urology;  Laterality: N/A;  . CYSTOSCOPY W/ RETROGRADES Bilateral 08/17/2016   Procedure: CYSTOSCOPY WITH RETROGRADE PYELOGRAM;  Surgeon: Jaime Espy, MD;  Location: [facility] ORS;  Service: Urology;  Laterality: Bilateral;  . CYSTOSCOPY WITH STENT PLACEMENT Right 08/17/2016   Procedure: CYSTOSCOPY WITH STENT PLACEMENT;  Surgeon: Jaime Espy, MD;  Location: [facility] ORS;  Service: Urology;  Laterality: Right;  . heart stent'  2013  . kidney stent Right 2018  . LYMPH NODE BIOPSY  2011   diagnosis of hodgkins lymphoma  . PELVIC LYMPH NODE DISSECTION N/A 07/29/2016   Procedure: PELVIC/AORTIC LYMPH NODE SAMPLING;  Surgeon: Jaime Ends, MD;  Location: [facility] ORS;  Service: Gynecology;  Laterality: N/A;  . PORTA CATH INSERTION N/A 09/22/2016   Procedure: Jaime Hall Cath Insertion;  Surgeon: Jaime Cabal, MD;  Location: [place] [facility];  Service: Cardiovascular;  Laterality: N/A;  . PORTA CATH INSERTION N/A 10/27/2019   Procedure: PORTA CATH INSERTION;  Surgeon: Jaime Cabal, MD;  Location: [place] [facility];  Service: Cardiovascular;  Laterality: N/A;  . PORTA CATH REMOVAL N/A 11/17/2016   Procedure: Jaime Hall Cath Removal;  Surgeon: Jaime Cabal, MD;  Location: [place] [facility];  Service: Cardiovascular;  Laterality: N/A;  . RIGHT/LEFT HEART CATH AND CORONARY ANGIOGRAPHY N/A 12/04/2019   Procedure: RIGHT/LEFT HEART CATH AND CORONARY ANGIOGRAPHY;  Surgeon: Jaime Hampshire, MD;  Location: [place] [facility];  Service: Cardiovascular;  Laterality: N/A;  . ROBOTIC ASSISTED TOTAL HYSTERECTOMY WITH BILATERAL SALPINGO OOPHERECTOMY N/A 07/29/2016   Procedure: ROBOTIC ASSISTED TOTAL HYSTERECTOMY WITH BILATERAL SALPINGO OOPHORECTOMY;  Surgeon: Jaime Ends, MD;  Location: [facility] ORS;  Service: Gynecology;  Laterality: N/A;  . SENTINEL NODE BIOPSY N/A 07/29/2016   Procedure: SENTINEL NODE BIOPSY;  Surgeon: Jaime Ends, MD;  Location: [facility] ORS;  Service:  Gynecology;  Laterality: N/A;  . transobturator sling N/A 2009   [place]    Allergies  Allergies  Allergen Reactions  . Adhesive [Tape] Other (See Comments)    Burns the skin  . Z-Pak [Azithromycin] Itching  . Antifungal [Miconazole Nitrate] Rash  . Sulfa Antibiotics Nausea And Vomiting and Rash    N/T    History of Present Illness    [name] is a 69 y.o. female with a hx of CAD (prior stent to LAD), Hodgkin's lymphoma, uterine cancer, endometrial cancer, HFrEF. She was last seen 11/02/19.  History of Hodgkin's lymphoma treated with radiation and chemotherapy to the chest area previously in remission since 2011.  Uterine cancer in 2018 treated with surgery and chemotherapy.  She is a former smoker.  Her CAD is s/p angioplasty and DES to proximal LAD with balloon angioplasty to the diagonal September 2013.  Treadmill nuclear stress test September 2014 fixed anterior wall defect with minor reversibility and normal LVEF.   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 Seen in clinic 11/02/19 for clearance for chemotherapy. Echo 11/02/19 showed LVEF 30-35%, gr1DD, RV normal size and function, mildly elevated PASP, mild-moderate MR/AI. 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  Jaime Hall, her oncologist, did change her chemotherapy agent due to the newly diagnosed heart failure. Subsequent stress test to rule out ischemic cardiomyopathy was high risk. R/LHC performed 12/04/19 with LVEF 30-35%, LVEDP moderately elevated, previous LAD stent patent, ost-prox LAD 40%. She was started on Lasix 20mg  daily.  Works in [employer]. Works six hour shifts five days per week typically. She has taken the summer off, but is hopeful to return to work in the fall. Notes some LE edema at end of shift, but resolves by morning and only occurs in her  left leg where lymph nodes were previously removed.   Reviewed all cardiac testing in depth. She was pleased with result of cardiac catheterization. Had chemotherapy Thursday and reported it made her nauseous and fatigued for a few days. Notices being more lightheaded since starting Lasix, particularly with position changes. She has had a tendency towards orthostasis in the past and her cymbalta has previously been reduced as a result.   Reports her catheterization site healed well with only minor bruising.  EKGs/Labs/Other Studies Reviewed:   The following studies were reviewed today: Echo 11/02/19 IMPRESSIONS    1. Left ventricular ejection fraction, by estimation, is 30 to 35%. The  left ventricle has moderately decreased function. The left ventricle  demonstrates global hypokinesis. Left ventricular diastolic parameters are  consistent with Grade I diastolic  dysfunction (impaired relaxation).   2. Right ventricular systolic function is normal. The right ventricular  size is normal. There is mildly elevated pulmonary artery systolic  pressure. The estimated right ventricular systolic pressure is 02.4 mmHg.   3. Mild to moderate mitral valve regurgitation.   4. Aortic valve regurgitation is mild to moderate.   EKG:  EKG  is ordered today.  The ekg ordered today demonstrates NSR 72 bpm with incomplete LBBB and no acute ST/T wave changes.   Recent Labs: 12/07/2019: ALT 31; BUN 18; Creatinine, Ser 1.22; Hemoglobin 10.5; Platelets 392; Potassium 4.1; Sodium 140  Recent Lipid Panel    Component Value Date/Time   CHOL 110 11/14/2019 1015   CHOL 110 04/11/2012 0835   TRIG 101 11/14/2019 1015   HDL 40 (L) 11/14/2019 1015   HDL 56 04/11/2012 0835   CHOLHDL 2.8 11/14/2019 1015   VLDL 20 11/14/2019 1015   LDLCALC 50 11/14/2019 1015   LDLCALC 41 04/11/2012 0835    Home Medications   Current Meds  Medication Sig  . aspirin EC 81 MG tablet Take 81 mg by mouth at bedtime.  Marland Kitchen  atorvastatin (LIPITOR) 10 MG tablet Take 1 tablet by mouth once daily (Patient taking differently: Take 10 mg by mouth every evening. )  . cholecalciferol (VITAMIN D3) 25 MCG (1000 UNIT) tablet Take 1,000 Units by mouth in the morning and at bedtime.  . cyanocobalamin (,VITAMIN B-12,) 1000 MCG/ML injection INJECT 1ML IM WEEKLY FOR 4 WEEKS, THEN INJECT MONTHLY THEREAFTER (Patient taking differently: Inject 1,000 mcg into the skin every 30 (thirty) days. )  . diazepam (VALIUM) 5 MG tablet Take 1 tablet (5 mg total) by mouth every 12 (twelve) hours as needed for anxiety. (Patient taking differently: Take 2.5 mg by mouth every 12 (twelve) hours as needed for anxiety. )  . DULoxetine (CYMBALTA) 30 MG capsule Take 1 capsule by mouth once daily (Patient taking differently: Take 30 mg by mouth daily. )  . furosemide (LASIX) 20 MG tablet Take 1 tablet (20 mg total) by mouth daily.  Marland Kitchen lidocaine-prilocaine (EMLA) cream Apply to affected area once (Patient taking differently: Apply 1 application topically daily as needed (Apply to affected area during port access). )  . LORazepam (ATIVAN) 0.5 MG tablet Take 1 tablet (0.5 mg total) by mouth every 6 (six) hours as needed (Nausea or vomiting). (Patient taking differently: Take 0.5 mg by mouth daily as needed (Nausea or vomiting). )  . metoprolol tartrate (LOPRESSOR) 25 MG tablet Take 1 tablet by mouth twice daily (Patient taking differently: Take 25 mg by mouth 2 (two) times daily. )  . nitroGLYCERIN (NITROSTAT) 0.4 MG SL tablet Place 1 tablet (0.4 mg total) under the tongue every 5 (five) minutes as needed for chest pain.  Marland Kitchen nystatin (MYCOSTATIN/NYSTOP) powder Apply 1 application topically 2 (two) times daily. To rash until resolved. (Patient taking differently: Apply 1 application topically daily as needed (rash). )  . ondansetron (ZOFRAN) 8 MG tablet Take 1 tablet (8 mg total) by mouth 2 (two) times daily as needed (Nausea or vomiting). (Patient taking differently:  Take 8 mg by mouth 2 (two) times daily as needed for nausea or vomiting. )  . OVER THE COUNTER MEDICATION Take 1 tablet by mouth daily as needed (reflux). crystallized ginger  . prochlorperazine (COMPAZINE) 10 MG tablet Take 1 tablet (10 mg total) by mouth every 6 (six) hours as needed (Nausea or vomiting). (Patient taking differently: Take 10 mg by mouth daily as needed for nausea or vomiting. )  . Syringe/Needle, Disp, (SYRINGE 3CC/25GX1") 25G X 1" 3 ML MISC Use for b12 injections      Review of Systems     Review of Systems  Constitutional: Negative for chills, fever and malaise/fatigue.  Cardiovascular: Positive for dyspnea on exertion (with more than usual activity) and leg swelling (left leg only  post lymph node removal). Negative for chest pain, near-syncope, orthopnea, palpitations and syncope.  Respiratory: Negative for cough, shortness of breath and wheezing.   Gastrointestinal: Negative for nausea and vomiting.  Neurological: Positive for light-headedness. Negative for dizziness and weakness.   All other systems reviewed and are otherwise negative except as noted above.  Physical Exam    VS:  BP 100/64 (BP Location: Left Arm, Patient Position: Sitting, Cuff Size: Normal)   Pulse 72   Ht 5\' 1"  (1.549 m)   Wt 159 lb 8 oz (72.3 kg)   SpO2 98%   BMI 30.14 kg/m  , BMI Body mass index is 30.14 kg/m. GEN: Well nourished, well developed, in no acute distress. HEENT: normal. Neck: Supple, no JVD, carotid bruits, or masses. Cardiac: RRR, no murmurs, rubs, or gallops. No clubbing, cyanosis, edema.  Radials/DP/PT 2+ and equal bilaterally.  Respiratory:  Respirations regular and unlabored, clear to auscultation bilaterally. GI: Soft, nontender, nondistended, BS + x 4. MS: No deformity or atrophy. Skin: Warm and dry, no rash. Neuro:  Strength and sensation are intact. Psych: Normal affect.  Assessment & Plan     1. CAD - Remote stent LAD 2013. Cath 12/04/19 with patent stent  and ost-lad 40% stenosed. R radial cath site healed with no ecchymosis, hematoma, erythema. Continue GDMT including aspirin, beta blocker, statin.   2. HLD, HLD goal <70 - Continue Atorvastatin 10mg  daily.   3. HTN - BP low normal today. Reports intermittently lightheadedness. Recommend making position changes carefully and compression stockings. Reduce Lasix to 20mg  QOD. Likely exacerbated by poor appetite a few days after chemo treatments. Will reassess in a few weeks via MyChart message and consider reducing dose of Metoprolol to 12.5mg .   4. HFrEF - Echo 11/02/19 as part of clearance for chemo revealed LVEF 30-35% and gr1DD. Subsequent R/LHC with no ischemia. 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 NYHA I-II with only mild DOE. No volume overload on exam. Addition of HF therapies limited by low BP and lightheadedness. Continue Toprol. Due to orthostasis, reduce Lasix to 20mg  QOD. Educated on low sodium diet and limiting to 2L fluid.   5. Valvular heart disease - Mild to moderate MR/AI by echo. Continue optimal BP and volume control. Anticipate monitoring with periodic echo.   Disposition: Follow up in 2 month(s) with Dr. [name].    Loel Dubonnet, NP 12/13/2019, 9:12 AM

## 2019-12-13 NOTE — Patient Instructions (Addendum)
Medication Instructions:  Your physician has recommended you make the following change in your medication:   CHANGE Lasix to 20mg  (one tablet) every other day  If you still notice lightheadedness and weakness we will consider reducing your dose of Metoprolol  *If you need a refill on your cardiac medications before your next appointment, please call your pharmacy*  Lab Work: None lab work today.   Testing/Procedures: Your EKG today was stable.  Your cardiac catheterization showed that your stent was patent, this was a good result.  Follow-Up: At [facility], you and your health needs are our priority.  As part of our continuing mission to provide you with exceptional heart care, we have created designated Provider Care Teams.  These Care Teams include your primary Cardiologist (physician) and Advanced Practice Providers (APPs -  Physician Assistants and Nurse Practitioners) who all work together to provide you with the care you need, when you need it.  We recommend signing up for the patient portal called "MyChart".  Sign up information is provided on this After Visit Summary.  MyChart is used to connect with patients for Virtual Visits (Telemedicine).  Patients are able to view lab/test results, encounter notes, upcoming appointments, etc.  Non-urgent messages can be sent to your provider as well.   To learn more about what you can do with MyChart, go to [email or web address].    Your next appointment:   2-3 month(s)  The format for your next appointment:   In Person  Provider:   You may see [name], MD or one of the following Advanced Practice Providers on your designated Care Team:    Murray Hodgkins, NP  Christell Faith, PA-C  Marrianne Mood, PA-C  Laurann Montana, NP  Other Instructions:   Orthostatic Hypotension Blood pressure is a measurement of how strongly, or weakly, your blood is pressing against the walls of your arteries. Orthostatic hypotension is a  sudden drop in blood pressure that happens when you quickly change positions, such as when you get up from sitting or lying down. Arteries are blood vessels that carry blood from your heart throughout your body. When blood pressure is too low, you may not get enough blood to your brain or to the rest of your organs. This can cause weakness, light-headedness, rapid heartbeat, and fainting. This can last for just a few seconds or for up to a few minutes. Orthostatic hypotension is usually not a serious problem. However, if it happens frequently or gets worse, it may be a sign of something more serious. What are the causes? This condition may be caused by:  Sudden changes in posture, such as standing up quickly after you have been sitting or lying down.  Blood loss.  Loss of body fluids (dehydration).  Heart problems.  Hormone (endocrine) problems.  Pregnancy.  Severe infection.  Lack of certain nutrients.  Severe allergic reactions (anaphylaxis).  Certain medicines, such as blood pressure medicine or medicines that make the body lose excess fluids (diuretics). Sometimes, this condition can be caused by not taking medicine as directed, such as taking too much of a certain medicine. What increases the risk? The following factors may make you more likely to develop this condition:  Age. Risk increases as you get older.  Conditions that affect the heart or the central nervous system.  Taking certain medicines, such as blood pressure medicine or diuretics.  Being pregnant. What are the signs or symptoms? Symptoms of this condition may include:  Weakness.  Light-headedness.  Dizziness.  Blurred vision.  Fatigue.  Rapid heartbeat.  Fainting, in severe cases. How is this diagnosed? This condition is diagnosed based on:  Your medical history.  Your symptoms.  Your blood pressure measurement. Your health care provider will check your blood pressure when you are: ? Lying  down. ? Sitting. ? Standing. A blood pressure reading is recorded as two numbers, such as "120 over 80" (or 120/80). The first ("top") number is called the systolic pressure. It is a measure of the pressure in your arteries as your heart beats. The second ("bottom") number is called the diastolic pressure. It is a measure of the pressure in your arteries when your heart relaxes between beats. Blood pressure is measured in a unit called mm Hg. Healthy blood pressure for most adults is 120/80. If your blood pressure is below 90/60, you may be diagnosed with hypotension. Other information or tests that may be used to diagnose orthostatic hypotension include:  Your other vital signs, such as your heart rate and temperature.  Blood tests.  Tilt table test. For this test, you will be safely secured to a table that moves you from a lying position to an upright position. Your heart rhythm and blood pressure will be monitored during the test. How is this treated? This condition may be treated by:  Changing your diet. This may involve eating more salt (sodium) or drinking more water.  Taking medicines to raise your blood pressure.  Changing the dosage of certain medicines you are taking that might be lowering your blood pressure.  Wearing compression stockings. These stockings help to prevent blood clots and reduce swelling in your legs. In some cases, you may need to go to the hospital for:  Fluid replacement. This means you will receive fluids through an IV.  Blood replacement. This means you will receive donated blood through an IV (transfusion).  Treating an infection or heart problems, if this applies.  Monitoring. You may need to be monitored while medicines that you are taking wear off. Follow these instructions at home: Eating and drinking   Drink enough fluid to keep your urine pale yellow.  Eat a healthy diet, and follow instructions from your health care provider about eating or  drinking restrictions. A healthy diet includes: ? Fresh fruits and vegetables. ? Whole grains. ? Lean meats. ? Low-fat dairy products.  Eat extra salt only as directed. Do not add extra salt to your diet unless your health care provider told you to do that.  Eat frequent, small meals.  Avoid standing up suddenly after eating. Medicines  Take over-the-counter and prescription medicines only as told by your health care provider. ? Follow instructions from your health care provider about changing the dosage of your current medicines, if this applies. ? Do not stop or adjust any of your medicines on your own. General instructions   Wear compression stockings as told by your health care provider.  Get up slowly from lying down or sitting positions. This gives your blood pressure a chance to adjust.  Avoid hot showers and excessive heat as directed by your health care provider.  Return to your normal activities as told by your health care provider. Ask your health care provider what activities are safe for you.  Do not use any products that contain nicotine or tobacco, such as cigarettes, e-cigarettes, and chewing tobacco. If you need help quitting, ask your health care provider.  Keep all follow-up visits as told by your health care provider. This is  important. Contact a health care provider if you:  Vomit.  Have diarrhea.  Have a fever for more than 2-3 days.  Feel more thirsty than usual.  Feel weak and tired. Get help right away if you:  Have chest pain.  Have a fast or irregular heartbeat.  Develop numbness in any part of your body.  Cannot move your arms or your legs.  Have trouble speaking.  Become sweaty or feel light-headed.  Faint.  Feel short of breath.  Have trouble staying awake.  Feel confused. Summary  Orthostatic hypotension is a sudden drop in blood pressure that happens when you quickly change positions.  Orthostatic hypotension is usually  not a serious problem.  It is diagnosed by having your blood pressure taken lying down, sitting, and then standing.  It may be treated by changing your diet or adjusting your medicines. This information is not intended to replace advice given to you by your health care provider. Make sure you discuss any questions you have with your health care provider. Document Revised: 11/25/2017 Document Reviewed: 11/25/2017 Elsevier Patient Education  [employer]

## 2019-12-14 ENCOUNTER — Encounter: Payer: Self-pay | Admitting: Oncology

## 2019-12-14 ENCOUNTER — Inpatient Hospital Stay: Payer: PPO

## 2019-12-14 ENCOUNTER — Inpatient Hospital Stay (HOSPITAL_BASED_OUTPATIENT_CLINIC_OR_DEPARTMENT_OTHER): Payer: PPO | Admitting: Oncology

## 2019-12-14 ENCOUNTER — Inpatient Hospital Stay: Payer: PPO | Attending: Oncology

## 2019-12-14 VITALS — BP 111/60 | HR 75 | Temp 98.0°F | Wt 160.6 lb

## 2019-12-14 DIAGNOSIS — Z79899 Other long term (current) drug therapy: Secondary | ICD-10-CM | POA: Insufficient documentation

## 2019-12-14 DIAGNOSIS — Z5111 Encounter for antineoplastic chemotherapy: Secondary | ICD-10-CM | POA: Diagnosis not present

## 2019-12-14 DIAGNOSIS — Z5112 Encounter for antineoplastic immunotherapy: Secondary | ICD-10-CM | POA: Diagnosis not present

## 2019-12-14 DIAGNOSIS — I509 Heart failure, unspecified: Secondary | ICD-10-CM | POA: Diagnosis not present

## 2019-12-14 DIAGNOSIS — Z833 Family history of diabetes mellitus: Secondary | ICD-10-CM | POA: Insufficient documentation

## 2019-12-14 DIAGNOSIS — I428 Other cardiomyopathies: Secondary | ICD-10-CM | POA: Diagnosis not present

## 2019-12-14 DIAGNOSIS — C541 Malignant neoplasm of endometrium: Secondary | ICD-10-CM | POA: Diagnosis not present

## 2019-12-14 DIAGNOSIS — D696 Thrombocytopenia, unspecified: Secondary | ICD-10-CM | POA: Insufficient documentation

## 2019-12-14 DIAGNOSIS — Z5189 Encounter for other specified aftercare: Secondary | ICD-10-CM | POA: Insufficient documentation

## 2019-12-14 DIAGNOSIS — I959 Hypotension, unspecified: Secondary | ICD-10-CM | POA: Diagnosis not present

## 2019-12-14 DIAGNOSIS — T451X5A Adverse effect of antineoplastic and immunosuppressive drugs, initial encounter: Secondary | ICD-10-CM

## 2019-12-14 DIAGNOSIS — Z8759 Personal history of other complications of pregnancy, childbirth and the puerperium: Secondary | ICD-10-CM | POA: Diagnosis not present

## 2019-12-14 DIAGNOSIS — D6481 Anemia due to antineoplastic chemotherapy: Secondary | ICD-10-CM | POA: Diagnosis not present

## 2019-12-14 DIAGNOSIS — Z823 Family history of stroke: Secondary | ICD-10-CM | POA: Insufficient documentation

## 2019-12-14 DIAGNOSIS — Z8571 Personal history of Hodgkin lymphoma: Secondary | ICD-10-CM | POA: Insufficient documentation

## 2019-12-14 DIAGNOSIS — R5383 Other fatigue: Secondary | ICD-10-CM | POA: Insufficient documentation

## 2019-12-14 DIAGNOSIS — Z8042 Family history of malignant neoplasm of prostate: Secondary | ICD-10-CM | POA: Insufficient documentation

## 2019-12-14 DIAGNOSIS — I2510 Atherosclerotic heart disease of native coronary artery without angina pectoris: Secondary | ICD-10-CM | POA: Diagnosis not present

## 2019-12-14 DIAGNOSIS — Z882 Allergy status to sulfonamides status: Secondary | ICD-10-CM | POA: Insufficient documentation

## 2019-12-14 DIAGNOSIS — J189 Pneumonia, unspecified organism: Secondary | ICD-10-CM | POA: Diagnosis not present

## 2019-12-14 DIAGNOSIS — Z808 Family history of malignant neoplasm of other organs or systems: Secondary | ICD-10-CM | POA: Insufficient documentation

## 2019-12-14 DIAGNOSIS — Z90722 Acquired absence of ovaries, bilateral: Secondary | ICD-10-CM | POA: Insufficient documentation

## 2019-12-14 DIAGNOSIS — I700 Atherosclerosis of aorta: Secondary | ICD-10-CM | POA: Diagnosis not present

## 2019-12-14 DIAGNOSIS — Z87891 Personal history of nicotine dependence: Secondary | ICD-10-CM | POA: Insufficient documentation

## 2019-12-14 DIAGNOSIS — C786 Secondary malignant neoplasm of retroperitoneum and peritoneum: Secondary | ICD-10-CM | POA: Diagnosis not present

## 2019-12-14 DIAGNOSIS — Z881 Allergy status to other antibiotic agents status: Secondary | ICD-10-CM | POA: Insufficient documentation

## 2019-12-14 DIAGNOSIS — N182 Chronic kidney disease, stage 2 (mild): Secondary | ICD-10-CM | POA: Diagnosis not present

## 2019-12-14 DIAGNOSIS — Z90710 Acquired absence of both cervix and uterus: Secondary | ICD-10-CM | POA: Insufficient documentation

## 2019-12-14 DIAGNOSIS — Z888 Allergy status to other drugs, medicaments and biological substances status: Secondary | ICD-10-CM | POA: Insufficient documentation

## 2019-12-14 DIAGNOSIS — G620 Drug-induced polyneuropathy: Secondary | ICD-10-CM | POA: Diagnosis not present

## 2019-12-14 DIAGNOSIS — Z800 Family history of malignant neoplasm of digestive organs: Secondary | ICD-10-CM | POA: Insufficient documentation

## 2019-12-14 DIAGNOSIS — Z803 Family history of malignant neoplasm of breast: Secondary | ICD-10-CM | POA: Insufficient documentation

## 2019-12-14 DIAGNOSIS — R1031 Right lower quadrant pain: Secondary | ICD-10-CM | POA: Diagnosis not present

## 2019-12-14 DIAGNOSIS — Z9049 Acquired absence of other specified parts of digestive tract: Secondary | ICD-10-CM | POA: Insufficient documentation

## 2019-12-14 DIAGNOSIS — I340 Nonrheumatic mitral (valve) insufficiency: Secondary | ICD-10-CM | POA: Diagnosis not present

## 2019-12-14 LAB — CBC WITH DIFFERENTIAL/PLATELET
Abs Immature Granulocytes: 0.04 10*3/uL (ref 0.00–0.07)
Basophils Absolute: 0.1 10*3/uL (ref 0.0–0.1)
Basophils Relative: 1 %
Eosinophils Absolute: 0.1 10*3/uL (ref 0.0–0.5)
Eosinophils Relative: 2 %
HCT: 29.6 % — ABNORMAL LOW (ref 36.0–46.0)
Hemoglobin: 9.6 g/dL — ABNORMAL LOW (ref 12.0–15.0)
Immature Granulocytes: 1 %
Lymphocytes Relative: 11 %
Lymphs Abs: 0.6 10*3/uL — ABNORMAL LOW (ref 0.7–4.0)
MCH: 29.1 pg (ref 26.0–34.0)
MCHC: 32.4 g/dL (ref 30.0–36.0)
MCV: 89.7 fL (ref 80.0–100.0)
Monocytes Absolute: 0.3 10*3/uL (ref 0.1–1.0)
Monocytes Relative: 5 %
Neutro Abs: 4.3 10*3/uL (ref 1.7–7.7)
Neutrophils Relative %: 80 %
Platelets: 277 10*3/uL (ref 150–400)
RBC: 3.3 MIL/uL — ABNORMAL LOW (ref 3.87–5.11)
RDW: 16.4 % — ABNORMAL HIGH (ref 11.5–15.5)
WBC: 5.3 10*3/uL (ref 4.0–10.5)
nRBC: 0 % (ref 0.0–0.2)

## 2019-12-14 LAB — COMPREHENSIVE METABOLIC PANEL
ALT: 32 U/L (ref 0–44)
AST: 31 U/L (ref 15–41)
Albumin: 3.6 g/dL (ref 3.5–5.0)
Alkaline Phosphatase: 88 U/L (ref 38–126)
Anion gap: 8 (ref 5–15)
BUN: 26 mg/dL — ABNORMAL HIGH (ref 8–23)
CO2: 28 mmol/L (ref 22–32)
Calcium: 8.6 mg/dL — ABNORMAL LOW (ref 8.9–10.3)
Chloride: 102 mmol/L (ref 98–111)
Creatinine, Ser: 1.2 mg/dL — ABNORMAL HIGH (ref 0.44–1.00)
GFR calc Af Amer: 53 mL/min — ABNORMAL LOW (ref >60)
GFR calc non Af Amer: 46 mL/min — ABNORMAL LOW (ref >60)
Glucose, Bld: 102 mg/dL — ABNORMAL HIGH (ref 70–99)
Potassium: 4.1 mmol/L (ref 3.5–5.1)
Sodium: 138 mmol/L (ref 135–145)
Total Bilirubin: 0.8 mg/dL (ref 0.3–1.2)
Total Protein: 6.6 g/dL (ref 6.5–8.1)

## 2019-12-14 MED ORDER — SODIUM CHLORIDE 0.9 % IV SOLN
114.3000 mg | Freq: Once | INTRAVENOUS | Status: AC
  Administered 2019-12-14: 110 mg via INTRAVENOUS
  Filled 2019-12-14: qty 11

## 2019-12-14 MED ORDER — HEPARIN SOD (PORK) LOCK FLUSH 100 UNIT/ML IV SOLN
500.0000 [IU] | Freq: Once | INTRAVENOUS | Status: DC | PRN
  Filled 2019-12-14: qty 5

## 2019-12-14 MED ORDER — PALONOSETRON HCL INJECTION 0.25 MG/5ML
0.2500 mg | Freq: Once | INTRAVENOUS | Status: AC
  Administered 2019-12-14: 0.25 mg via INTRAVENOUS
  Filled 2019-12-14: qty 5

## 2019-12-14 MED ORDER — SODIUM CHLORIDE 0.9 % IV SOLN
10.0000 mg | Freq: Once | INTRAVENOUS | Status: AC
  Administered 2019-12-14: 10 mg via INTRAVENOUS
  Filled 2019-12-14: qty 10

## 2019-12-14 MED ORDER — HEPARIN SOD (PORK) LOCK FLUSH 100 UNIT/ML IV SOLN
500.0000 [IU] | Freq: Once | INTRAVENOUS | Status: AC
  Administered 2019-12-14: 500 [IU] via INTRAVENOUS
  Filled 2019-12-14: qty 5

## 2019-12-14 MED ORDER — PEGFILGRASTIM 6 MG/0.6ML ~~LOC~~ PSKT
6.0000 mg | PREFILLED_SYRINGE | Freq: Once | SUBCUTANEOUS | Status: AC
  Administered 2019-12-14: 6 mg via SUBCUTANEOUS
  Filled 2019-12-14: qty 0.6

## 2019-12-14 MED ORDER — HEPARIN SOD (PORK) LOCK FLUSH 100 UNIT/ML IV SOLN
INTRAVENOUS | Status: AC
  Filled 2019-12-14: qty 5

## 2019-12-14 MED ORDER — SODIUM CHLORIDE 0.9 % IV SOLN
1400.0000 mg | Freq: Once | INTRAVENOUS | Status: AC
  Administered 2019-12-14: 1400 mg via INTRAVENOUS
  Filled 2019-12-14: qty 26.3

## 2019-12-14 MED ORDER — SODIUM CHLORIDE 0.9% FLUSH
10.0000 mL | INTRAVENOUS | Status: DC | PRN
  Administered 2019-12-14: 10 mL via INTRAVENOUS
  Filled 2019-12-14: qty 10

## 2019-12-14 MED ORDER — SODIUM CHLORIDE 0.9 % IV SOLN
INTRAVENOUS | Status: DC
  Filled 2019-12-14: qty 250

## 2019-12-14 NOTE — Progress Notes (Signed)
Hematology/Oncology Consult note [facility]  [phone] [phone]  Patient Care Team: [name], MD as PCP - General (Internal Medicine) Wellington Hampshire, MD as PCP - Cardiology (Cardiology) [name], MD as Consulting Physician (General Surgery) Mellody Drown, MD as Referring Physician (Obstetrics and Gynecology) [name], MD as Referring Physician (Obstetrics and Gynecology) Hollice Espy, MD as Consulting Physician (Urology) [name], RN as Registered Nurse Sindy Guadeloupe, MD as Consulting Physician (Oncology)   Name of the patient: Jaime Hall  [id number]  [birth year]   Date of visit: 12/14/19  Diagnosis- 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 She underwent robotic hysterectomy with bilateral salpingo-oophorectomy with washings and sentinel lymph node injection mapping and biopsy in February 2018. She had multiple postoperative complications and completed 3 cycles of carbotaxol chemotherapy in May 2018. Treatment was complicated by chemo-induced peripheral neuropathy for which Taxol dose was reduced by 25%. She completed 5 weeks of external beam whole pelvic radiation on August 2018.Her original tumor in 2018 was ER +1%. PR negative. MSI stable and HER-2 negative  She was under clinical surveillance and recently presented to [name] with symptoms of right lower quadrant abdominal pain which led to aCT abdomen which showed new peritoneal metastatic disease throughout the right abdomen and pelvis with index masses measuring 5.7 x 4.7 cm. Head  CT scan also showed evidence of hypermetabolic abdominal pelvic peritoneal metastases but no evidence of other sites of metastases.  Repeat omental biopsy confirms high grade serous carcinoma.Foundation 1 testing showed following alteration/biomarkers. Microsatellite status stable. EPH for be amplification, ESR 1 amplification, HNF1 A G 292 FS 25, follow-up 1 amplification, with 3 ER 1 loss, PPP 2R1 AAS 256F, PTEN loss, T p53. However none of these mutations have actionable targets.  Repeat echocardiogram showed EF of 30 to 35%.  Cardiac cath unremarkable consistent with nonischemic cardiomyopathy.Therefore Doxil not chosen and she would be getting carboplatin and gemcitabine   Interval history-leg swelling has improved after taking Lasix.  She has cut down on the dose of her Lasix given hypotension.Cardiology is also thinking about cutting down on her dose of metoprolol.  Reports some ongoing fatigue but denies other complaints  ECOG PS- 1 Pain scale- 0   Review of systems- Review of Systems  Constitutional: Negative for chills, fever, malaise/fatigue and weight loss.  HENT: Negative for congestion, ear discharge and nosebleeds.   Eyes: Negative for blurred vision.  Respiratory: Negative for cough, hemoptysis, sputum production, shortness of breath and wheezing.   Cardiovascular: Negative for chest pain, palpitations, orthopnea and claudication.  Gastrointestinal: Negative for abdominal pain, blood in stool, constipation, diarrhea, heartburn, melena, nausea and vomiting.  Genitourinary: Negative for dysuria, flank pain, frequency, hematuria and urgency.  Musculoskeletal: Negative for back pain, joint pain and myalgias.  Skin: Negative for rash.  Neurological: Negative for dizziness, tingling, focal weakness, seizures, weakness and headaches.  Endo/Heme/Allergies: Does not bruise/bleed easily.  Psychiatric/Behavioral: Negative for depression and suicidal ideas. The patient does not  have insomnia.        Allergies  Allergen Reactions  . Adhesive [Tape] Other (See Comments)    Burns the skin  . Z-Pak [  Azithromycin] Itching  . Antifungal [Miconazole Nitrate] Rash  . Sulfa Antibiotics Nausea And Vomiting and Rash    N/T     Past Medical History:  Diagnosis Date  . Anxiety   . Cervicalgia   . Coronary artery disease    a. 02/2012 Stress echo: severe anterior wall ischemia;  b. 02/2012 Cath/PCI: LAD 95p (3.0 x 15 Xience EX DES), D1 90ost (PTCA - bifurcational dzs), EF 45% with anterior HK;  b. 02/2013 Ex MV: fixed anterior defect w/ minor reversibility, nl EF-->Med Rx.  . Endometrial cancer ([place])    a. 07/2016 s/p robotic hysterectomy, BSO w/ washings, sentinel node inj, mapping, bx, adhesiolysis.  . Essential hypertension, benign   . Fibrocystic breast disease   . GERD (gastroesophageal reflux disease)   . Gestational hypertension   . Heart murmur   . History of anemia   . History of blood transfusion   . Hodgkin's lymphoma ([place]) 2011   a. s/p radiation and chemo therapy  . Osteoarthritis   . Polycystic ovarian disease      Past Surgical History:  Procedure Laterality Date  . ABDOMINAL HYSTERECTOMY    . bladder sling    . CARDIAC CATHETERIZATION  02/2012   [facility] 1 stent place  . CERVICAL POLYPECTOMY    . CHOLECYSTECTOMY  1982  . COLONOSCOPY WITH PROPOFOL N/A 02/05/2015   Procedure: COLONOSCOPY WITH PROPOFOL;  Surgeon: Lucilla Lame, MD;  Location: [facility] END[facility]COPY;  Service: Endoscopy;  Laterality: N/A;  . CORONARY ANGIOPLASTY  02/2012   left/right s/p balloon  . CYSTOGRAM N/A 08/17/2016   Procedure: CYSTOGRAM;  Surgeon: Hollice Espy, MD;  Location: [facility] ORS;  Service: Urology;  Laterality: N/A;  . CYSTOSCOPY N/A 08/17/2016   Procedure: CYSTOSCOPY EXAM UNDER ANESTHESIA;  Surgeon: Hollice Espy, MD;  Location: [facility] ORS;  Service: Urology;  Laterality: N/A;  . CYSTOSCOPY W/ RETROGRADES Bilateral 08/17/2016   Procedure: CYSTOSCOPY WITH RETROGRADE PYELOGRAM;   Surgeon: Hollice Espy, MD;  Location: [facility] ORS;  Service: Urology;  Laterality: Bilateral;  . CYSTOSCOPY WITH STENT PLACEMENT Right 08/17/2016   Procedure: CYSTOSCOPY WITH STENT PLACEMENT;  Surgeon: Hollice Espy, MD;  Location: [facility] ORS;  Service: Urology;  Laterality: Right;  . heart stent'  2013  . kidney stent Right 2018  . LYMPH NODE BIOPSY  2011   diagnosis of hodgkins lymphoma  . PELVIC LYMPH NODE DISSECTION N/A 07/29/2016   Procedure: PELVIC/AORTIC LYMPH NODE SAMPLING;  Surgeon: [name], MD;  Location: [facility] ORS;  Service: Gynecology;  Laterality: N/A;  . PORTA CATH INSERTION N/A 09/22/2016   Procedure: Glori Luis Cath Insertion;  Surgeon: [name], MD;  Location: [place] [facility];  Service: Cardiovascular;  Laterality: N/A;  . PORTA CATH INSERTION N/A 10/27/2019   Procedure: PORTA CATH INSERTION;  Surgeon: [name], MD;  Location: [employer] [facility];  Service: Cardiovascular;  Laterality: N/A;  . PORTA CATH REMOVAL N/A 11/17/2016   Procedure: Glori Luis Cath Removal;  Surgeon: [name], MD;  Location: [place] [facility];  Service: Cardiovascular;  Laterality: N/A;  . RIGHT/LEFT HEART CATH AND CORONARY ANGIOGRAPHY N/A 12/04/2019   Procedure: RIGHT/LEFT HEART CATH AND CORONARY ANGIOGRAPHY;  Surgeon: Wellington Hampshire, MD;  Location: [place] [facility];  Service: Cardiovascular;  Laterality: N/A;  . ROBOTIC ASSISTED TOTAL HYSTERECTOMY WITH BILATERAL SALPINGO OOPHERECTOMY N/A 07/29/2016   Procedure: ROBOTIC ASSISTED TOTAL HYSTERECTOMY WITH BILATERAL SALPINGO OOPHORECTOMY;  Surgeon: [name], MD;  Location: [facility];  Service: Gynecology;  Laterality:  N/A;  . SENTINEL NODE BIOPSY N/A 07/29/2016   Procedure: SENTINEL NODE BIOPSY;  Surgeon: [name], MD;  Location: [facility];  Service: Gynecology;  Laterality: N/A;  . transobturator sling N/A 2009   [place]    Social History   Socioeconomic History  . Marital status:  Widowed    Spouse name: Not on file  . Number of children: Not on file  . Years of education: Not on file  . Highest education level: Not on file  Occupational History  . Not on file  Tobacco Use  . Smoking status: Former Smoker    Packs/day: 1.00    Years: 30.00    Pack years: 30.00    Types: Cigarettes    Quit date: 07/24/2002    Years since quitting: 17.4  . Smokeless tobacco: Never Used  . Tobacco comment: quit smoking in 2000  Vaping Use  . Vaping Use: Never used  Substance and Sexual Activity  . Alcohol use: Not Currently  . Drug use: No  . Sexual activity: Never  Other Topics Concern  . 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    Social Determinants of Health   Financial Resource Strain:   . Difficulty of Paying Living Expenses:   Food Insecurity:   . Worried About Charity fundraiser in the Last Year:   . Arboriculturist in the Last Year:   Transportation Needs:   . Film/video editor (Medical):   Marland Kitchen Lack of Transportation (Non-Medical):   Physical Activity:   . Days of Exercise per Week:   . Minutes of Exercise per Session:   Stress:   . Feeling of Stress :   Social Connections:   . Frequency of Communication with Friends and Family:   . Frequency of Social Gatherings with Friends and Family:   . Attends Religious Services:   . Active Member of Clubs or Organizations:   . Attends Archivist Meetings:   Marland Kitchen Marital Status:   Intimate Partner Violence:   . Fear of Current or Ex-Partner:   . Emotionally Abused:   Marland Kitchen Physically Abused:   . Sexually Abused:     Family History  Problem Relation Age of Onset  . ALS Father   . Polymyositis Father   . Diabetes Brother   . Cancer Maternal Aunt        breast  . Breast cancer Maternal Aunt        30's  . Stroke Maternal Grandmother   . Cancer Maternal Grandfather        prostate  . Stroke Maternal Grandfather    . Colon cancer Maternal Aunt   . Non-Hodgkin's lymphoma Cousin      Current Outpatient Medications:  .  aspirin EC 81 MG tablet, Take 81 mg by mouth at bedtime., Disp: , Rfl:  .  atorvastatin (LIPITOR) 10 MG tablet, Take 1 tablet by mouth once daily (Patient taking differently: Take 10 mg by mouth every evening. ), Disp: 90 tablet, Rfl: 2 .  cholecalciferol (VITAMIN D3) 25 MCG (1000 UNIT) tablet, Take 1,000 Units by mouth in the morning and at bedtime., Disp: , Rfl:  .  cyanocobalamin (,VITAMIN B-12,) 1000 MCG/ML injection, INJECT 1ML IM WEEKLY FOR 4 WEEKS, THEN INJECT MONTHLY THEREAFTER (Patient taking differently: Inject 1,000 mcg into the skin every 30 (thirty) days. ), Disp: 10 mL,  Rfl: 0 .  diazepam (VALIUM) 5 MG tablet, Take 1 tablet (5 mg total) by mouth every 12 (twelve) hours as needed for anxiety. (Patient taking differently: Take 2.5 mg by mouth every 12 (twelve) hours as needed for anxiety. ), Disp: 30 tablet, Rfl: 0 .  DULoxetine (CYMBALTA) 30 MG capsule, Take 1 capsule by mouth once daily (Patient taking differently: Take 30 mg by mouth daily. ), Disp: 90 capsule, Rfl: 0 .  furosemide (LASIX) 20 MG tablet, Take 1 tablet (20 mg) by mouth once every other day, Disp: , Rfl:  .  lidocaine-prilocaine (EMLA) cream, Apply to affected area once (Patient taking differently: Apply 1 application topically daily as needed (Apply to affected area during port access). ), Disp: 30 g, Rfl: 3 .  LORazepam (ATIVAN) 0.5 MG tablet, Take 1 tablet (0.5 mg total) by mouth every 6 (six) hours as needed (Nausea or vomiting). (Patient taking differently: Take 0.5 mg by mouth daily as needed (Nausea or vomiting). ), Disp: 30 tablet, Rfl: 0 .  metoprolol tartrate (LOPRESSOR) 25 MG tablet, Take 1 tablet by mouth twice daily (Patient taking differently: Take 25 mg by mouth 2 (two) times daily. ), Disp: 180 tablet, Rfl: 2 .  nitroGLYCERIN (NITROSTAT) 0.4 MG SL tablet, Place 1 tablet (0.4 mg total) under the tongue  every 5 (five) minutes as needed for chest pain., Disp: 25 tablet, Rfl: 3 .  nystatin (MYCOSTATIN/NYSTOP) powder, Apply 1 application topically 2 (two) times daily. To rash until resolved. (Patient taking differently: Apply 1 application topically daily as needed (rash). ), Disp: 30 g, Rfl: 0 .  ondansetron (ZOFRAN) 8 MG tablet, Take 1 tablet (8 mg total) by mouth 2 (two) times daily as needed (Nausea or vomiting). (Patient taking differently: Take 8 mg by mouth 2 (two) times daily as needed for nausea or vomiting. ), Disp: 30 tablet, Rfl: 1 .  OVER THE COUNTER MEDICATION, Take 1 tablet by mouth daily as needed (reflux). crystallized ginger, Disp: , Rfl:  .  prochlorperazine (COMPAZINE) 10 MG tablet, Take 1 tablet (10 mg total) by mouth every 6 (six) hours as needed (Nausea or vomiting). (Patient taking differently: Take 10 mg by mouth daily as needed for nausea or vomiting. ), Disp: 30 tablet, Rfl: 1 .  Syringe/Needle, Disp, (SYRINGE 3CC/25GX1") 25G X 1" 3 ML MISC, Use for b12 injections, Disp: 50 each, Rfl: 0 No current facility-administered medications for this visit.  Facility-Administered Medications Ordered in Other Visits:  .  heparin lock flush 100 unit/mL, 500 Units, Intravenous, Once, Randa Evens C, MD .  sodium chloride flush (NS) 0.9 % injection 10 mL, 10 mL, Intravenous, PRN, Sindy Guadeloupe, MD, 10 mL at 12/07/19 0914 .  sodium chloride flush (NS) 0.9 % injection 10 mL, 10 mL, Intravenous, PRN, Sindy Guadeloupe, MD, 10 mL at 12/14/19 0821  Physical exam:  Vitals:   12/14/19 0829  BP: 111/60  Pulse: 75  Temp: 98 F (36.7 C)  TempSrc: Tympanic  Weight: 160 lb 9 oz (72.8 kg)   Physical Exam Cardiovascular:     Rate and Rhythm: Normal rate and regular rhythm.     Heart sounds: Normal heart sounds.  Pulmonary:     Effort: Pulmonary effort is normal.  Skin:    General: Skin is warm and dry.  Neurological:     Mental Status: She is alert and oriented to person, place, and time.        CMP Latest Ref Rng & Units 12/07/2019  Glucose 70 - 99 mg/dL 106(H)  BUN 8 - 23 mg/dL 18  Creatinine 0.44 - 1.00 mg/dL 1.22(H)  Sodium 135 - 145 mmol/L 140  Potassium 3.5 - 5.1 mmol/L 4.1  Chloride 98 - 111 mmol/L 103  CO2 22 - 32 mmol/L 27  Calcium 8.9 - 10.3 mg/dL 8.8(L)  Total Protein 6.5 - 8.1 g/dL 6.8  Total Bilirubin 0.3 - 1.2 mg/dL 1.0  Alkaline Phos 38 - 126 U/L 99  AST 15 - 41 U/L 36  ALT 0 - 44 U/L 31   CBC Latest Ref Rng & Units 12/14/2019  WBC 4.0 - 10.5 K/uL 5.3  Hemoglobin 12.0 - 15.0 g/dL 9.6(L)  Hematocrit 36 - 46 % 29.6(L)  Platelets 150 - 400 K/uL 277    No images are attached to the encounter.  CARDIAC CATHETERIZATION  Result Date: 12/04/2019  There is moderate to severe left ventricular systolic dysfunction.  LV end diastolic pressure is moderately elevated.  Previously placed Prox LAD stent (unknown type) is widely patent.  Ost LAD to Prox LAD lesion is 40% stenosed.  1.  Patent LAD stent with no significant restenosis.  There is moderate stenosis in ostial LAD before the stent.  No evidence of obstructive disease overall. 2.  Moderately to severely reduced LV systolic function with an EF of 30 to 35%. 3.  Right heart catheterization showed mildly elevated filling pressures with pulmonary capillary wedge pressure of 18 mmHg, mild to moderate pulmonary hypertension at 44/19 with a mean of 33 mmHg, and mildly reduced cardiac output at 3.89 with a cardiac index of 2.24.  Pulmonary vascular resistance is 3.8 [place] units Recommendations: Continue medical therapy for coronary artery disease. 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  She is mildly volume overloaded and I added small dose furosemide. Recommend medical therapy for cardiomyopathy.   NM Myocar Multi W/Spect W/Wall Motion / EF  Addendum Date: 11/24/2019   Pharmacological myocardial perfusion imaging study with large region of ischemia in the anterior and  anterolateral region Septal wall hypokinesis, EF estimated at 41% (depressed ejection fraction possibly secondary to significant GI uptake artifact) No EKG changes concerning for ischemia at peak stress or in recovery. CT attenuation correction image with mild aortic atherosclerosis, mild coronary calcification High risk scan Signed, Esmond Plants, MD, Ph.D [facility]   Result Date: 11/24/2019 Pharmacological myocardial perfusion imaging study with large region of ischemia in the anterior and anterolateral region Septal wall hypokinesis, EF estimated at 41% (depressed ejection fraction possibly secondary to significant GI uptake artifact) No EKG changes concerning for ischemia at peak stress or in recovery. CT attenuation correction image with mild aortic atherosclerosis, mild coronary calcification Low risk scan Signed, Esmond Plants, MD, Ph.D [facility]     Assessment and plan- Patient is a 69 y.o. 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  She is here for on treatment assessment prior to cycle 2-day 8 of carboplatin and gemcitabine chemotherapy  From a heart failure standpoint patient is not overtly symptomatic.  Medical management for nonischemic cardiomyopathy per cardiology.  Counts are otherwise okay to proceed with cycle 2-day 8 of gemcitabine and carboplatin today.  She will also receive on pro Neulasta support.  I will see her back in 2 weeks with CBC with differential and CMP and CA-125 for cycle 3-day 1 of chemotherapy.  Chemo-induced anemia: Iron studies B12 and folate are normal.  Hemoglobin was 12 prior to starting chemotherapy and  has drifted down to 9.6.  Continue to monitor  Chemo-induced peripheral neuropathy: Currently stable on Cymbalta   Visit Diagnosis 1. Encounter for antineoplastic chemotherapy   2. Chemotherapy-induced peripheral neuropathy ([place])   3. Endometrial cancer ([place])   4. Non-ischemic  cardiomyopathy ([place])   5. Anemia due to antineoplastic chemotherapy      Dr. Randa Evens, MD, MPH [facility] [phone] 12/14/2019 8:49 AM

## 2019-12-20 ENCOUNTER — Other Ambulatory Visit: Payer: Self-pay | Admitting: Internal Medicine

## 2019-12-20 ENCOUNTER — Encounter: Payer: Self-pay | Admitting: Family

## 2019-12-20 ENCOUNTER — Inpatient Hospital Stay (HOSPITAL_BASED_OUTPATIENT_CLINIC_OR_DEPARTMENT_OTHER): Payer: PPO | Admitting: Oncology

## 2019-12-20 ENCOUNTER — Telehealth: Payer: Self-pay | Admitting: *Deleted

## 2019-12-20 ENCOUNTER — Other Ambulatory Visit: Payer: Self-pay

## 2019-12-20 DIAGNOSIS — R1112 Projectile vomiting: Secondary | ICD-10-CM

## 2019-12-20 DIAGNOSIS — C541 Malignant neoplasm of endometrium: Secondary | ICD-10-CM | POA: Diagnosis not present

## 2019-12-20 DIAGNOSIS — R1084 Generalized abdominal pain: Secondary | ICD-10-CM | POA: Diagnosis not present

## 2019-12-20 DIAGNOSIS — Z1231 Encounter for screening mammogram for malignant neoplasm of breast: Secondary | ICD-10-CM

## 2019-12-20 MED ORDER — FUROSEMIDE 20 MG PO TABS: 20.0000 mg | ORAL_TABLET | ORAL | 2 refills | Status: DC | PRN

## 2019-12-20 MED ORDER — PANTOPRAZOLE SODIUM 20 MG PO TBEC: 20.0000 mg | DELAYED_RELEASE_TABLET | Freq: Every day | ORAL | 1 refills | Status: DC

## 2019-12-20 NOTE — Telephone Encounter (Signed)
I added an 1130 phone visit to your schedule

## 2019-12-20 NOTE — Telephone Encounter (Signed)
Thanks so much. 

## 2019-12-20 NOTE — Telephone Encounter (Signed)
Patient called reporting that she has been vomiting the past 2 days, but not this morning yet, she states it happens when she eats and then she takes her medicine afterwards. She also reports that her "gut hurts" which is causing nausea. She states that she is not on a PPI. I advised that she should take her nausea medicine before she eats and not after vomiting. She is asking if there is anything she can eat or do to make this better, she is already eating saltines. Please advise

## 2019-12-20 NOTE — Progress Notes (Signed)
Virtual Visit via Telephone Note  I connected with Jaime Hall on 12/20/19 at 11:30 AM EDT by telephone and verified that I am speaking with the correct person using two identifiers.  Location: Patient: Home Provider: Clinic    I discussed the limitations, risks, security and privacy concerns of performing an evaluation and management service by telephone and the availability of in person appointments. I also discussed with the patient that there may be a patient responsible charge related to this service. The patient expressed understanding and agreed to proceed.   History of Present Illness: Jaime Hall is a 69 year old female with past medical history significant for CAD, chronic venous insufficiency, PAD, heart failure, CKD stage II, hyperlipidemia, history of Hodgkin's lymphoma who was recently diagnosed with stage I high-grade serous endometrial cancer who initially presented with postmenopausal bleeding back in February 2018.  She underwent robotic hysterectomy with bilateral salpingo-oophorectomy.  She had multiple postoperative complications and completed 3 cycles of carbotaxol in May 2018.  Had several side effects from chemo including peripheral neuropathy.  Completed 5 weeks of external beam whole pelvic radiation August 2018.  She was on active surveillance.  She recently presented to [name] with symptoms of right lower quadrant abdominal pain which led to imaging revealing metastatic disease throughout the right abdomen and pelvis.  PET scan showed hypermetabolic activity in her abdomen and pelvic region with no distant metastasis.  Omental biopsy confirmed high-grade serous carcinoma.  She is followed by Dr. Janese Banks and has completed 2 cycles of carbo/Gemzar last given on 12/14/2019.  Today, she calls clinic with concerns of nausea and vomiting over the past 2 days (beginning Sunday).  Symptoms exacerbated with food and medication administration.  Associated symptoms include  abdominal pain and discomfort that is generalized and vomting X 2 occasions after eating.  States she feels better today and has not had any episodes of nausea or vomiting.  Her abdominal pain is intermittent and feels like "gas pains" with burning.  Chronically constipated with "hard rabbit like stools".  Currently not on bowel regimen. Last BM was yesterday. Not on narcotics. Has self stopped her lasix d/t feeling dizziness when she stands. She is eating and drinking as best she can.  Nonrelated concerns include a sore, dry mouth without mouth sores and a dry nose and scabbing.     Observations/Objective: Review of Systems  Constitutional: Positive for malaise/fatigue. Negative for chills, fever and weight loss.  HENT: Negative for congestion, ear pain and tinnitus.        Dry nose, sore mouth   Eyes: Negative.  Negative for blurred vision and double vision.  Respiratory: Negative.  Negative for cough, sputum production and shortness of breath.   Cardiovascular: Negative.  Negative for chest pain, palpitations and leg swelling.  Gastrointestinal: Positive for abdominal pain, constipation, nausea and vomiting. Negative for diarrhea.  Genitourinary: Negative for dysuria, frequency and urgency.  Musculoskeletal: Negative for back pain and falls.  Skin: Negative.  Negative for rash.  Neurological: Positive for weakness. Negative for headaches.  Endo/Heme/Allergies: Negative.  Does not bruise/bleed easily.  Psychiatric/Behavioral: Negative.  Negative for depression. The patient is not nervous/anxious and does not have insomnia.    Assessment and Plan:  Abdominal pain/bloating -unclear etiology.likely secondary to chemotherapy and chronic constipation.  Pain is intermittent last a few minutes up to an hour and is relieved on its own.  Rates pain 5 out of 10.  She is currently not taking any narcotics or medication for pain.  Recommend trialing MiraLAX for couple days and adding a PPI given  administration of IV steroids.  If symptoms do not improve would recommend additional imaging given malignancy and concerns for possible bowel obstruction.  Sore mouth-likely related to chemotherapy.  Rx Magic mouthwash.  Constipation-recommend OTC MiraLAX.  Drink plenty of fluids.  Would recommend IV fluids if symptoms have not improved in about 24 hours.  Dry nose- can try OTC saline spray  Hypotension-patient self stopped Lasix.  She has been in contact with cardiology.  Nausea/vomiting-continue Ativan, Zofran and Compazine as needed.   Can trial Miralax OTC.  RX Protonix 20 mg daily.  Can trial nasal spray for dry nose.  RX Magic mouthwash sent to pharmacy.  Follow Up Instructions: RTC if symptoms do not improve or worsen. RTC on 12/28/2019.  I discussed the assessment and treatment plan with the patient. The patient was provided an opportunity to ask questions and all were answered. The patient agreed with the plan and demonstrated an understanding of the instructions.   The patient was advised to call back or seek an in-person evaluation if the symptoms worsen or if the condition fails to improve as anticipated.  I provided 25 minutes of non-face-to-face time during this encounter.   Jacquelin Hawking, NP   CC: Dr. Janese Banks

## 2019-12-20 NOTE — Telephone Encounter (Signed)
Would she like to come in or have a telephone visit? I am happy to chat with her.   Faythe Casa, NP 12/20/2019 9:53 AM

## 2019-12-20 NOTE — Telephone Encounter (Signed)
She said either way is fine and is agreeable to a telephone call

## 2019-12-25 ENCOUNTER — Telehealth: Payer: Self-pay | Admitting: *Deleted

## 2019-12-25 NOTE — Telephone Encounter (Signed)
I am not sure what she means by pain in her side. Unless we see her we cant help her. I tried calling her and she did not pick up

## 2019-12-25 NOTE — Telephone Encounter (Signed)
Patient called reporting that she has a pain in her side and to ask if she needs to keep her appointment with the other doctor since she was seen in our office. Please advise

## 2019-12-25 NOTE — Telephone Encounter (Signed)
Call returned to patient and advised of Dr Elroy Channel response, offered appointment with [facility] and she refused and stated that she will wait and that she will be checking into other options. I told her that if she changes her mind to call back and I would get her in with [facility] and she angrily replied "you ain't got to do anything, I'm done and if this thing ruptures somebody is going to answer for it, goodbye" She sounded tearful as she said goodbye

## 2019-12-25 NOTE — Telephone Encounter (Signed)
I see her in 3 days if she can wait. If she cant, she will need to be seen in [facility]

## 2019-12-26 ENCOUNTER — Encounter: Payer: Self-pay | Admitting: Family

## 2019-12-26 NOTE — Telephone Encounter (Signed)
I know she did a video visit with Sonia Baller and there was mention of pain at that time

## 2019-12-28 ENCOUNTER — Inpatient Hospital Stay: Payer: PPO

## 2019-12-28 ENCOUNTER — Other Ambulatory Visit: Payer: Self-pay | Admitting: *Deleted

## 2019-12-28 ENCOUNTER — Other Ambulatory Visit: Payer: Self-pay

## 2019-12-28 ENCOUNTER — Encounter: Payer: Self-pay | Admitting: Oncology

## 2019-12-28 ENCOUNTER — Inpatient Hospital Stay (HOSPITAL_BASED_OUTPATIENT_CLINIC_OR_DEPARTMENT_OTHER): Payer: PPO | Admitting: Oncology

## 2019-12-28 VITALS — BP 100/52 | HR 79 | Temp 98.3°F | Resp 16 | Ht 61.0 in | Wt 162.0 lb

## 2019-12-28 VITALS — BP 108/71 | HR 81 | Resp 16

## 2019-12-28 DIAGNOSIS — C786 Secondary malignant neoplasm of retroperitoneum and peritoneum: Secondary | ICD-10-CM | POA: Diagnosis not present

## 2019-12-28 DIAGNOSIS — G63 Polyneuropathy in diseases classified elsewhere: Secondary | ICD-10-CM

## 2019-12-28 DIAGNOSIS — C541 Malignant neoplasm of endometrium: Secondary | ICD-10-CM

## 2019-12-28 DIAGNOSIS — D6481 Anemia due to antineoplastic chemotherapy: Secondary | ICD-10-CM | POA: Diagnosis not present

## 2019-12-28 DIAGNOSIS — Z5111 Encounter for antineoplastic chemotherapy: Secondary | ICD-10-CM | POA: Diagnosis not present

## 2019-12-28 DIAGNOSIS — D6959 Other secondary thrombocytopenia: Secondary | ICD-10-CM

## 2019-12-28 DIAGNOSIS — Z5112 Encounter for antineoplastic immunotherapy: Secondary | ICD-10-CM | POA: Diagnosis not present

## 2019-12-28 DIAGNOSIS — C8590 Non-Hodgkin lymphoma, unspecified, unspecified site: Secondary | ICD-10-CM

## 2019-12-28 DIAGNOSIS — T451X5A Adverse effect of antineoplastic and immunosuppressive drugs, initial encounter: Secondary | ICD-10-CM | POA: Diagnosis not present

## 2019-12-28 DIAGNOSIS — Z95828 Presence of other vascular implants and grafts: Secondary | ICD-10-CM

## 2019-12-28 LAB — COMPREHENSIVE METABOLIC PANEL
ALT: 25 U/L (ref 0–44)
AST: 26 U/L (ref 15–41)
Albumin: 3.3 g/dL — ABNORMAL LOW (ref 3.5–5.0)
Alkaline Phosphatase: 113 U/L (ref 38–126)
Anion gap: 8 (ref 5–15)
BUN: 14 mg/dL (ref 8–23)
CO2: 27 mmol/L (ref 22–32)
Calcium: 8.3 mg/dL — ABNORMAL LOW (ref 8.9–10.3)
Chloride: 106 mmol/L (ref 98–111)
Creatinine, Ser: 1.36 mg/dL — ABNORMAL HIGH (ref 0.44–1.00)
GFR calc Af Amer: 46 mL/min — ABNORMAL LOW (ref >60)
GFR calc non Af Amer: 39 mL/min — ABNORMAL LOW (ref >60)
Glucose, Bld: 100 mg/dL — ABNORMAL HIGH (ref 70–99)
Potassium: 3.9 mmol/L (ref 3.5–5.1)
Sodium: 141 mmol/L (ref 135–145)
Total Bilirubin: 0.7 mg/dL (ref 0.3–1.2)
Total Protein: 5.9 g/dL — ABNORMAL LOW (ref 6.5–8.1)

## 2019-12-28 LAB — CBC WITH DIFFERENTIAL/PLATELET
Abs Immature Granulocytes: 0.63 10*3/uL — ABNORMAL HIGH (ref 0.00–0.07)
Basophils Absolute: 0 10*3/uL (ref 0.0–0.1)
Basophils Relative: 0 %
Eosinophils Absolute: 0.2 10*3/uL (ref 0.0–0.5)
Eosinophils Relative: 2 %
HCT: 24.9 % — ABNORMAL LOW (ref 36.0–46.0)
Hemoglobin: 8.1 g/dL — ABNORMAL LOW (ref 12.0–15.0)
Immature Granulocytes: 7 %
Lymphocytes Relative: 8 %
Lymphs Abs: 0.8 10*3/uL (ref 0.7–4.0)
MCH: 30.1 pg (ref 26.0–34.0)
MCHC: 32.5 g/dL (ref 30.0–36.0)
MCV: 92.6 fL (ref 80.0–100.0)
Monocytes Absolute: 0.9 10*3/uL (ref 0.1–1.0)
Monocytes Relative: 10 %
Neutro Abs: 6.9 10*3/uL (ref 1.7–7.7)
Neutrophils Relative %: 73 %
Platelets: 146 10*3/uL — ABNORMAL LOW (ref 150–400)
RBC: 2.69 MIL/uL — ABNORMAL LOW (ref 3.87–5.11)
RDW: 19.3 % — ABNORMAL HIGH (ref 11.5–15.5)
WBC: 9.4 10*3/uL (ref 4.0–10.5)
nRBC: 0 % (ref 0.0–0.2)

## 2019-12-28 MED ORDER — OXYCODONE HCL 5 MG PO TABS: 5.0000 mg | ORAL_TABLET | Freq: Four times a day (QID) | ORAL | 0 refills | Status: DC | PRN

## 2019-12-28 MED ORDER — SODIUM CHLORIDE 0.9 % IV SOLN
10.0000 mg | Freq: Once | INTRAVENOUS | Status: AC
  Administered 2019-12-28: 10 mg via INTRAVENOUS
  Filled 2019-12-28: qty 10

## 2019-12-28 MED ORDER — SODIUM CHLORIDE 0.9 % IV SOLN
105.1500 mg | Freq: Once | INTRAVENOUS | Status: AC
  Administered 2019-12-28: 110 mg via INTRAVENOUS
  Filled 2019-12-28: qty 11

## 2019-12-28 MED ORDER — SODIUM CHLORIDE 0.9% FLUSH
10.0000 mL | INTRAVENOUS | Status: DC | PRN
  Administered 2019-12-28: 10 mL via INTRAVENOUS
  Filled 2019-12-28: qty 10

## 2019-12-28 MED ORDER — HEPARIN SOD (PORK) LOCK FLUSH 100 UNIT/ML IV SOLN
500.0000 [IU] | Freq: Once | INTRAVENOUS | Status: AC | PRN
  Administered 2019-12-28: 500 [IU]
  Filled 2019-12-28: qty 5

## 2019-12-28 MED ORDER — PALONOSETRON HCL INJECTION 0.25 MG/5ML
0.2500 mg | Freq: Once | INTRAVENOUS | Status: AC
  Administered 2019-12-28: 0.25 mg via INTRAVENOUS
  Filled 2019-12-28: qty 5

## 2019-12-28 MED ORDER — SODIUM CHLORIDE 0.9 % IV SOLN
1400.0000 mg | Freq: Once | INTRAVENOUS | Status: AC
  Administered 2019-12-28: 1400 mg via INTRAVENOUS
  Filled 2019-12-28: qty 26.3

## 2019-12-28 MED ORDER — SODIUM CHLORIDE 0.9 % IV SOLN
Freq: Once | INTRAVENOUS | Status: AC
  Filled 2019-12-28: qty 250

## 2019-12-28 MED ORDER — HEPARIN SOD (PORK) LOCK FLUSH 100 UNIT/ML IV SOLN
INTRAVENOUS | Status: AC
  Filled 2019-12-28: qty 5

## 2019-12-28 NOTE — Progress Notes (Signed)
Pt having issues with bowels. She took miralax every day for 1 week and she would have small little balls of stool each day but had pain. Eating is not good week of chemo and then eating is good again

## 2019-12-28 NOTE — Addendum Note (Signed)
Addended by: Luella Cook on: 12/28/2019 11:58 AM   Modules accepted: Orders

## 2019-12-28 NOTE — Progress Notes (Signed)
BP 100/52 and HGB 8.1. Per Dr. Janese Banks, okay to proceed with treatment.

## 2019-12-28 NOTE — Addendum Note (Signed)
Addended by: Luella Cook on: 12/28/2019 10:46 AM   Modules accepted: Orders

## 2019-12-28 NOTE — Progress Notes (Signed)
Hematology/Oncology Consult note [facility]  [phone] [phone]  Patient Care Team: [name], MD as PCP - General (Internal Medicine) Wellington Hampshire, MD as PCP - Cardiology (Cardiology) [name], MD as Consulting Physician (General Surgery) Mellody Drown, MD as Referring Physician (Obstetrics and Gynecology) [name], MD as Referring Physician (Obstetrics and Gynecology) Hollice Espy, MD as Consulting Physician (Urology) [name], RN as Registered Nurse Sindy Guadeloupe, MD as Consulting Physician (Oncology)   Name of the patient: Jaime Hall  [id number]  1950-08-18   Date of visit: 12/28/19  Diagnosis- 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 She underwent robotic hysterectomy with bilateral salpingo-oophorectomy with washings and sentinel lymph node injection mapping and biopsy in February 2018. She had multiple postoperative complications and completed 3 cycles of carbotaxol chemotherapy in May 2018. Treatment was complicated by chemo-induced peripheral neuropathy for which Taxol dose was reduced by 25%. She completed 5 weeks of external beam whole pelvic radiation on August 2018.Her original tumor in 2018 was ER +1%. PR negative. MSI stable and HER-2 negative  She was under clinical surveillance and recently presented to [name] with symptoms of right lower quadrant abdominal pain which led to aCT abdomen which showed new peritoneal metastatic disease throughout the right abdomen and pelvis with index masses measuring 5.7 x 4.7 cm. Head CT  scan also showed evidence of hypermetabolic abdominal pelvic peritoneal metastases but no evidence of other sites of metastases.  Repeat omental biopsy confirms high grade serous carcinoma.Foundation 1 testing showed following alteration/biomarkers. Microsatellite status stable. EPH for be amplification, ESR 1 amplification, HNF1 A G 292 FS 25, follow-up 1 amplification, with 3 ER 1 loss, PPP 2R1 AAS 256F, PTEN loss, T p53. However none of these mutations have actionable targets.  Repeat echocardiogram showed EF of 30 to 35%.  Cardiac cath unremarkable consistent with nonischemic cardiomyopathy.Therefore Doxil not chosen and she would be getting carboplatin and gemcitabine    Interval history-reports she had throbbing left lower quadrant abdominal pain about 4 days ago.  Got worse up until 2 days back.  Today she reports no pain.  During this time she has been constipated on and off and has been using MiraLAX.  Today she had a normal bowel movement.  No blood in stool.  No fever.    ECOG PS- 1 Pain scale- 0 Opioid associated constipation- no  Review of systems- Review of Systems  Constitutional: Negative for chills, fever, malaise/fatigue and weight loss.  HENT: Negative for congestion, ear discharge and nosebleeds.   Eyes: Negative for blurred vision.  Respiratory: Negative for cough, hemoptysis, sputum production, shortness of breath and wheezing.   Cardiovascular: Negative for chest pain, palpitations, orthopnea and claudication.  Gastrointestinal: Positive for abdominal pain. Negative for blood in stool, constipation, diarrhea, heartburn, melena, nausea and vomiting.  Genitourinary: Negative for dysuria, flank pain, frequency, hematuria and urgency.  Musculoskeletal: Negative for back pain, joint pain and myalgias.  Skin: Negative for rash.  Neurological: Negative for dizziness, tingling, focal weakness, seizures, weakness and headaches.  Endo/Heme/Allergies: Does not  bruise/bleed easily.  Psychiatric/Behavioral: Negative for depression and suicidal ideas. The patient does not have insomnia.  Allergies  Allergen Reactions  . Adhesive [Tape] Other (See Comments)    Burns the skin  . Z-Pak [Azithromycin] Itching  . Antifungal [Miconazole Nitrate] Rash  . Sulfa Antibiotics Nausea And Vomiting and Rash    N/T     Past Medical History:  Diagnosis Date  . Anxiety   . Cervicalgia   . Coronary artery disease    a. 02/2012 Stress echo: severe anterior wall ischemia;  b. 02/2012 Cath/PCI: LAD 95p (3.0 x 15 Xience EX DES), D1 90ost (PTCA - bifurcational dzs), EF 45% with anterior HK;  b. 02/2013 Ex MV: fixed anterior defect w/ minor reversibility, nl EF-->Med Rx.  . Endometrial cancer ([place])    a. 07/2016 s/p robotic hysterectomy, BSO w/ washings, sentinel node inj, mapping, bx, adhesiolysis.  . Essential hypertension, benign   . Fibrocystic breast disease   . GERD (gastroesophageal reflux disease)   . Gestational hypertension   . Heart murmur   . History of anemia   . History of blood transfusion   . Hodgkin's lymphoma ([place]) 2011   a. s/p radiation and chemo therapy  . Osteoarthritis   . Polycystic ovarian disease      Past Surgical History:  Procedure Laterality Date  . ABDOMINAL HYSTERECTOMY    . bladder sling    . CARDIAC CATHETERIZATION  02/2012   [facility] 1 stent place  . CERVICAL POLYPECTOMY    . CHOLECYSTECTOMY  1982  . COLONOSCOPY WITH PROPOFOL N/A 02/05/2015   Procedure: COLONOSCOPY WITH PROPOFOL;  Surgeon: Lucilla Lame, MD;  Location: [facility] ENDOSCOPY;  Service: Endoscopy;  Laterality: N/A;  . CORONARY ANGIOPLASTY  02/2012   left/right s/p balloon  . CYSTOGRAM N/A 08/17/2016   Procedure: CYSTOGRAM;  Surgeon: Hollice Espy, MD;  Location: [facility] ORS;  Service: Urology;  Laterality: N/A;  . CYSTOSCOPY N/A 08/17/2016   Procedure: CYSTOSCOPY EXAM UNDER ANESTHESIA;  Surgeon: Hollice Espy, MD;  Location: [facility] ORS;  Service: Urology;  Laterality:  N/A;  . CYSTOSCOPY W/ RETROGRADES Bilateral 08/17/2016   Procedure: CYSTOSCOPY WITH RETROGRADE PYELOGRAM;  Surgeon: Hollice Espy, MD;  Location: [facility] ORS;  Service: Urology;  Laterality: Bilateral;  . CYSTOSCOPY WITH STENT PLACEMENT Right 08/17/2016   Procedure: CYSTOSCOPY WITH STENT PLACEMENT;  Surgeon: Hollice Espy, MD;  Location: [facility] ORS;  Service: Urology;  Laterality: Right;  . heart stent'  2013  . kidney stent Right 2018  . LYMPH NODE BIOPSY  2011   diagnosis of hodgkins lymphoma  . PELVIC LYMPH NODE DISSECTION N/A 07/29/2016   Procedure: PELVIC/AORTIC LYMPH NODE SAMPLING;  Surgeon: [name], MD;  Location: [facility] ORS;  Service: Gynecology;  Laterality: N/A;  . PORTA CATH INSERTION N/A 09/22/2016   Procedure: Glori Luis Cath Insertion;  Surgeon: [name], MD;  Location: [place] [facility];  Service: Cardiovascular;  Laterality: N/A;  . PORTA CATH INSERTION N/A 10/27/2019   Procedure: PORTA CATH INSERTION;  Surgeon: [name], MD;  Location: [place] [facility];  Service: Cardiovascular;  Laterality: N/A;  . PORTA CATH REMOVAL N/A 11/17/2016   Procedure: Glori Luis Cath Removal;  Surgeon: [name], MD;  Location: [place] [facility];  Service: Cardiovascular;  Laterality: N/A;  . RIGHT/LEFT HEART CATH AND CORONARY ANGIOGRAPHY N/A 12/04/2019   Procedure: RIGHT/LEFT HEART CATH AND CORONARY ANGIOGRAPHY;  Surgeon: Wellington Hampshire, MD;  Location: [place] [facility];  Service: Cardiovascular;  Laterality: N/A;  . ROBOTIC ASSISTED TOTAL HYSTERECTOMY WITH BILATERAL SALPINGO OOPHERECTOMY N/A 07/29/2016   Procedure: ROBOTIC ASSISTED TOTAL  HYSTERECTOMY WITH BILATERAL SALPINGO OOPHORECTOMY;  Surgeon: [name], MD;  Location: [facility] ORS;  Service: Gynecology;  Laterality: N/A;  . SENTINEL NODE BIOPSY N/A 07/29/2016   Procedure: SENTINEL NODE BIOPSY;  Surgeon: [name], MD;  Location: [facility] ORS;  Service: Gynecology;  Laterality: N/A;  .  transobturator sling N/A 2009   [place]    Social History   Socioeconomic History  . Marital status: Widowed    Spouse name: Not on file  . Number of children: Not on file  . Years of education: Not on file  . Highest education level: Not on file  Occupational History  . Not on file  Tobacco Use  . Smoking status: Former Smoker    Packs/day: 1.00    Years: 30.00    Pack years: 30.00    Types: Cigarettes    Quit date: 07/24/2002    Years since quitting: 17.4  . Smokeless tobacco: Never Used  . Tobacco comment: quit smoking in 2000  Vaping Use  . Vaping Use: Never used  Substance and Sexual Activity  . Alcohol use: Not Currently  . Drug use: No  . Sexual activity: Never  Other Topics Concern  . 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    Social Determinants of Health   Financial Resource Strain:   . Difficulty of Paying Living Expenses:   Food Insecurity:   . Worried About Charity fundraiser in the Last Year:   . Arboriculturist in the Last Year:   Transportation Needs:   . Film/video editor (Medical):   Marland Kitchen Lack of Transportation (Non-Medical):   Physical Activity:   . Days of Exercise per Week:   . Minutes of Exercise per Session:   Stress:   . Feeling of Stress :   Social Connections:   . Frequency of Communication with Friends and Family:   . Frequency of Social Gatherings with Friends and Family:   . Attends Religious Services:   . Active Member of Clubs or Organizations:   . Attends Archivist Meetings:   Marland Kitchen Marital Status:   Intimate Partner Violence:   . Fear of Current or Ex-Partner:   . Emotionally Abused:   Marland Kitchen Physically Abused:   . Sexually Abused:     Family History  Problem Relation Age of Onset  . ALS Father   . Polymyositis Father   . Diabetes Brother   . Cancer Maternal Aunt        breast  . Breast cancer Maternal Aunt        30's  .  Stroke Maternal Grandmother   . Cancer Maternal Grandfather        prostate  . Stroke Maternal Grandfather   . Colon cancer Maternal Aunt   . Non-Hodgkin's lymphoma Cousin      Current Outpatient Medications:  .  aspirin EC 81 MG tablet, Take 81 mg by mouth at bedtime., Disp: , Rfl:  .  atorvastatin (LIPITOR) 10 MG tablet, Take 1 tablet by mouth once daily (Patient taking differently: Take 10 mg by mouth every evening. ), Disp: 90 tablet, Rfl: 2 .  cholecalciferol (VITAMIN D3) 25 MCG (1000 UNIT) tablet, Take 1,000 Units by mouth in the morning and at bedtime., Disp: , Rfl:  .  cyanocobalamin (,VITAMIN B-12,) 1000 MCG/ML injection, INJECT 1ML IM WEEKLY FOR 4 WEEKS, THEN  INJECT MONTHLY THEREAFTER (Patient taking differently: Inject 1,000 mcg into the skin every 30 (thirty) days. ), Disp: 10 mL, Rfl: 0 .  diazepam (VALIUM) 5 MG tablet, Take 1 tablet (5 mg total) by mouth every 12 (twelve) hours as needed for anxiety. (Patient taking differently: Take 2.5 mg by mouth every 12 (twelve) hours as needed for anxiety. ), Disp: 30 tablet, Rfl: 0 .  DULoxetine (CYMBALTA) 30 MG capsule, Take 1 capsule by mouth once daily (Patient taking differently: Take 30 mg by mouth daily. ), Disp: 90 capsule, Rfl: 0 .  furosemide (LASIX) 20 MG tablet, Take 1 tablet (20 mg total) by mouth as needed for fluid or edema (Take one tablet daily as needed for lower extremity edema or for weight gain of 2 lbs overnight or 5 lbs in one week.)., Disp: 30 tablet, Rfl: 2 .  lidocaine-prilocaine (EMLA) cream, Apply to affected area once (Patient taking differently: Apply 1 application topically daily as needed (Apply to affected area during port access). ), Disp: 30 g, Rfl: 3 .  LORazepam (ATIVAN) 0.5 MG tablet, Take 1 tablet (0.5 mg total) by mouth every 6 (six) hours as needed (Nausea or vomiting). (Patient taking differently: Take 0.5 mg by mouth daily as needed (Nausea or vomiting). ), Disp: 30 tablet, Rfl: 0 .  metoprolol  tartrate (LOPRESSOR) 25 MG tablet, Take 1 tablet by mouth twice daily (Patient taking differently: Take 25 mg by mouth 2 (two) times daily. ), Disp: 180 tablet, Rfl: 2 .  nitroGLYCERIN (NITROSTAT) 0.4 MG SL tablet, Place 1 tablet (0.4 mg total) under the tongue every 5 (five) minutes as needed for chest pain., Disp: 25 tablet, Rfl: 3 .  nystatin (MYCOSTATIN/NYSTOP) powder, Apply 1 application topically 2 (two) times daily. To rash until resolved. (Patient taking differently: Apply 1 application topically daily as needed (rash). ), Disp: 30 g, Rfl: 0 .  ondansetron (ZOFRAN) 8 MG tablet, Take 1 tablet (8 mg total) by mouth 2 (two) times daily as needed (Nausea or vomiting). (Patient taking differently: Take 8 mg by mouth 2 (two) times daily as needed for nausea or vomiting. ), Disp: 30 tablet, Rfl: 1 .  OVER THE COUNTER MEDICATION, Take 1 tablet by mouth daily as needed (reflux). crystallized ginger, Disp: , Rfl:  .  pantoprazole (PROTONIX) 20 MG tablet, Take 1 tablet (20 mg total) by mouth daily., Disp: 60 tablet, Rfl: 1 .  prochlorperazine (COMPAZINE) 10 MG tablet, Take 1 tablet (10 mg total) by mouth every 6 (six) hours as needed (Nausea or vomiting). (Patient taking differently: Take 10 mg by mouth daily as needed for nausea or vomiting. ), Disp: 30 tablet, Rfl: 1 .  Syringe/Needle, Disp, (SYRINGE 3CC/25GX1") 25G X 1" 3 ML MISC, Use for b12 injections, Disp: 50 each, Rfl: 0 No current facility-administered medications for this visit.  Facility-Administered Medications Ordered in Other Visits:  .  sodium chloride flush (NS) 0.9 % injection 10 mL, 10 mL, Intravenous, PRN, Sindy Guadeloupe, MD, 10 mL at 12/07/19 0914 .  sodium chloride flush (NS) 0.9 % injection 10 mL, 10 mL, Intravenous, PRN, Sindy Guadeloupe, MD, 10 mL at 12/28/19 0758  Physical exam:  Vitals:   12/28/19 0838  BP: (!) 100/52  Pulse: 79  Resp: 16  Temp: 98.3 F (36.8 C)  TempSrc: Oral  Weight: 162 lb (73.5 kg)  Height: _0   (1.549 m)   Physical Exam HENT:     Head: Normocephalic and atraumatic.  Eyes:  Pupils: Pupils are equal, round, and reactive to light.  Cardiovascular:     Rate and Rhythm: Normal rate and regular rhythm.     Heart sounds: Normal heart sounds.  Pulmonary:     Effort: Pulmonary effort is normal.     Breath sounds: Normal breath sounds.  Abdominal:     General: Bowel sounds are normal.     Palpations: Abdomen is soft.     Comments: There is mild tenderness to palpation over the left iliac bone but not in the left lower quadrant per se  Musculoskeletal:     Cervical back: Normal range of motion.  Skin:    General: Skin is warm and dry.  Neurological:     Mental Status: She is alert and oriented to person, place, and time.      CMP Latest Ref Rng & Units 12/28/2019  Glucose 70 - 99 mg/dL 100(H)  BUN 8 - 23 mg/dL 14  Creatinine 0.44 - 1.00 mg/dL 1.36(H)  Sodium 135 - 145 mmol/L 141  Potassium 3.5 - 5.1 mmol/L 3.9  Chloride 98 - 111 mmol/L 106  CO2 22 - 32 mmol/L 27  Calcium 8.9 - 10.3 mg/dL 8.3(L)  Total Protein 6.5 - 8.1 g/dL 5.9(L)  Total Bilirubin 0.3 - 1.2 mg/dL 0.7  Alkaline Phos 38 - 126 U/L 113  AST 15 - 41 U/L 26  ALT 0 - 44 U/L 25   CBC Latest Ref Rng & Units 12/28/2019  WBC 4.0 - 10.5 K/uL 9.4  Hemoglobin 12.0 - 15.0 g/dL 8.1(L)  Hematocrit 36 - 46 % 24.9(L)  Platelets 150 - 400 K/uL 146(L)    No images are attached to the encounter.  CARDIAC CATHETERIZATION  Result Date: 12/04/2019  There is moderate to severe left ventricular systolic dysfunction.  LV end diastolic pressure is moderately elevated.  Previously placed Prox LAD stent (unknown type) is widely patent.  Ost LAD to Prox LAD lesion is 40% stenosed.  1.  Patent LAD stent with no significant restenosis.  There is moderate stenosis in ostial LAD before the stent.  No evidence of obstructive disease overall. 2.  Moderately to severely reduced LV systolic function with an EF of 30 to 35%. 3.  Right  heart catheterization showed mildly elevated filling pressures with pulmonary capillary wedge pressure of 18 mmHg, mild to moderate pulmonary hypertension at 44/19 with a mean of 33 mmHg, and mildly reduced cardiac output at 3.89 with a cardiac index of 2.24.  Pulmonary vascular resistance is 3.8 [place] units Recommendations: Continue medical therapy for coronary artery disease. 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  She is mildly volume overloaded and I added small dose furosemide. Recommend medical therapy for cardiomyopathy.     Assessment and plan- Patient is a 69 y.o. 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 She is here for on treatment assessment prior to cycle 3-day 1 of carboplatin and gemcitabine chemotherapy  Patient is getting anemic with every chemo cycle and her hemoglobin is down to 8.1 today.  Iron studies B12 and folate recently checked were normal.  I will proceed with cycle 3 chemotherapy at this time but if she continues to have cytopenias I will have a low threshold to stop chemotherapy and switch her to lenvatinib Keytruda.  Mild thrombocytopenia which we will also monitor  She will proceed with cycle 3-day 1 of carboplatin and gemcitabine today and cycle 3-day 8 next week.  We will  also arrange for hold tube for possible transfusion next week   She will be seen by covering MD in 3 weeks for cycle 4-day 1 if counts permit.  Plan to repeat scans after 4 cycles.  She is getting carboplatin at reduced dose of AUC 1.5  I will see her back in 6 weeks for cycle 5-day 1 of carboplatin and gemcitabine  Abdominal pain: She does not have any tenderness in the left lower quadrant at this time.  She is not tachycardic or febrile.  I will hold off on getting CT scans at this time.  However if her pain recurs we will have a low threshold to get them done.  I have also given her  oxycodone 5 mg every 6 as needed if she has abdominal pain at home.  She will call us if her symptoms do not improve.  She also knows she needs to go to the ER over the weekend if her abdominal pain is worse   Visit Diagnosis 1. Encounter for antineoplastic chemotherapy   2. Antineoplastic chemotherapy induced anemia   3. Chemotherapy-induced thrombocytopenia   4. 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 Endometrial cancer [facility])      Dr. Randa Evens, MD, MPH [facility] at [facility] [phone] 12/28/2019 9:21 AM

## 2019-12-29 ENCOUNTER — Observation Stay (HOSPITAL_BASED_OUTPATIENT_CLINIC_OR_DEPARTMENT_OTHER)
Admit: 2019-12-29 | Discharge: 2019-12-29 | Disposition: A | Discharge status: Home / Self Care | Payer: PPO | Attending: Cardiovascular Disease | Admitting: Cardiovascular Disease

## 2019-12-29 ENCOUNTER — Emergency Department: Payer: PPO

## 2019-12-29 ENCOUNTER — Other Ambulatory Visit: Payer: Self-pay

## 2019-12-29 ENCOUNTER — Observation Stay
Admission: EM | Admit: 2019-12-29 | Discharge: 2019-12-30 | Disposition: A | Discharge status: Home / Self Care | Payer: PPO | Attending: Internal Medicine | Admitting: Internal Medicine

## 2019-12-29 ENCOUNTER — Encounter: Payer: Self-pay | Admitting: Emergency Medicine

## 2019-12-29 DIAGNOSIS — I130 Hypertensive heart and chronic kidney disease with heart failure and stage 1 through stage 4 chronic kidney disease, or unspecified chronic kidney disease: Secondary | ICD-10-CM | POA: Diagnosis not present

## 2019-12-29 DIAGNOSIS — I248 Other forms of acute ischemic heart disease: Secondary | ICD-10-CM

## 2019-12-29 DIAGNOSIS — I5021 Acute systolic (congestive) heart failure: Secondary | ICD-10-CM

## 2019-12-29 DIAGNOSIS — I2510 Atherosclerotic heart disease of native coronary artery without angina pectoris: Secondary | ICD-10-CM | POA: Diagnosis present

## 2019-12-29 DIAGNOSIS — I110 Hypertensive heart disease with heart failure: Secondary | ICD-10-CM | POA: Diagnosis not present

## 2019-12-29 DIAGNOSIS — R0789 Other chest pain: Secondary | ICD-10-CM | POA: Diagnosis not present

## 2019-12-29 DIAGNOSIS — D72829 Elevated white blood cell count, unspecified: Secondary | ICD-10-CM | POA: Diagnosis not present

## 2019-12-29 DIAGNOSIS — R2689 Other abnormalities of gait and mobility: Secondary | ICD-10-CM | POA: Insufficient documentation

## 2019-12-29 DIAGNOSIS — I5043 Acute on chronic combined systolic (congestive) and diastolic (congestive) heart failure: Secondary | ICD-10-CM | POA: Diagnosis not present

## 2019-12-29 DIAGNOSIS — Z7982 Long term (current) use of aspirin: Secondary | ICD-10-CM | POA: Diagnosis not present

## 2019-12-29 DIAGNOSIS — N1832 Chronic kidney disease, stage 3b: Secondary | ICD-10-CM | POA: Diagnosis present

## 2019-12-29 DIAGNOSIS — Z79899 Other long term (current) drug therapy: Secondary | ICD-10-CM | POA: Diagnosis not present

## 2019-12-29 DIAGNOSIS — I25118 Atherosclerotic heart disease of native coronary artery with other forms of angina pectoris: Secondary | ICD-10-CM | POA: Diagnosis not present

## 2019-12-29 DIAGNOSIS — C541 Malignant neoplasm of endometrium: Secondary | ICD-10-CM

## 2019-12-29 DIAGNOSIS — J189 Pneumonia, unspecified organism: Secondary | ICD-10-CM | POA: Diagnosis not present

## 2019-12-29 DIAGNOSIS — R778 Other specified abnormalities of plasma proteins: Secondary | ICD-10-CM | POA: Diagnosis not present

## 2019-12-29 DIAGNOSIS — E785 Hyperlipidemia, unspecified: Secondary | ICD-10-CM

## 2019-12-29 DIAGNOSIS — R042 Hemoptysis: Secondary | ICD-10-CM

## 2019-12-29 DIAGNOSIS — R079 Chest pain, unspecified: Secondary | ICD-10-CM

## 2019-12-29 DIAGNOSIS — I5042 Chronic combined systolic (congestive) and diastolic (congestive) heart failure: Secondary | ICD-10-CM | POA: Diagnosis not present

## 2019-12-29 DIAGNOSIS — Z8542 Personal history of malignant neoplasm of other parts of uterus: Secondary | ICD-10-CM | POA: Insufficient documentation

## 2019-12-29 DIAGNOSIS — R7889 Finding of other specified substances, not normally found in blood: Secondary | ICD-10-CM | POA: Diagnosis not present

## 2019-12-29 DIAGNOSIS — N1831 Chronic kidney disease, stage 3a: Secondary | ICD-10-CM

## 2019-12-29 DIAGNOSIS — I10 Essential (primary) hypertension: Secondary | ICD-10-CM | POA: Diagnosis present

## 2019-12-29 DIAGNOSIS — Z20822 Contact with and (suspected) exposure to covid-19: Secondary | ICD-10-CM | POA: Diagnosis not present

## 2019-12-29 DIAGNOSIS — Z79891 Long term (current) use of opiate analgesic: Secondary | ICD-10-CM | POA: Diagnosis not present

## 2019-12-29 DIAGNOSIS — I420 Dilated cardiomyopathy: Secondary | ICD-10-CM

## 2019-12-29 DIAGNOSIS — F418 Other specified anxiety disorders: Secondary | ICD-10-CM | POA: Diagnosis present

## 2019-12-29 DIAGNOSIS — R110 Nausea: Secondary | ICD-10-CM | POA: Diagnosis not present

## 2019-12-29 DIAGNOSIS — I509 Heart failure, unspecified: Secondary | ICD-10-CM | POA: Diagnosis not present

## 2019-12-29 DIAGNOSIS — R0602 Shortness of breath: Secondary | ICD-10-CM | POA: Diagnosis not present

## 2019-12-29 LAB — CBC
HCT: 27.1 % — ABNORMAL LOW (ref 36.0–46.0)
Hemoglobin: 8.7 g/dL — ABNORMAL LOW (ref 12.0–15.0)
MCH: 30.1 pg (ref 26.0–34.0)
MCHC: 32.1 g/dL (ref 30.0–36.0)
MCV: 93.8 fL (ref 80.0–100.0)
Platelets: 218 10*3/uL (ref 150–400)
RBC: 2.89 MIL/uL — ABNORMAL LOW (ref 3.87–5.11)
RDW: 19.9 % — ABNORMAL HIGH (ref 11.5–15.5)
WBC: 17.4 10*3/uL — ABNORMAL HIGH (ref 4.0–10.5)
nRBC: 0 % (ref 0.0–0.2)

## 2019-12-29 LAB — ECHOCARDIOGRAM COMPLETE
AR max vel: 1.66 cm2
AV Area VTI: 2.3 cm2
AV Area mean vel: 1.96 cm2
AV Mean grad: 6 mmHg
AV Peak grad: 12.8 mmHg
Ao pk vel: 1.79 m/s
Area-P 1/2: 4.49 cm2
Calc EF: 46.9 %
P 1/2 time: 505 msec
S' Lateral: 4.59 cm
Single Plane A2C EF: 49.7 %
Single Plane A4C EF: 44.5 %

## 2019-12-29 LAB — BASIC METABOLIC PANEL
Anion gap: 9 (ref 5–15)
BUN: 18 mg/dL (ref 8–23)
CO2: 26 mmol/L (ref 22–32)
Calcium: 8.5 mg/dL — ABNORMAL LOW (ref 8.9–10.3)
Chloride: 105 mmol/L (ref 98–111)
Creatinine, Ser: 1.26 mg/dL — ABNORMAL HIGH (ref 0.44–1.00)
GFR calc Af Amer: 50 mL/min — ABNORMAL LOW (ref >60)
GFR calc non Af Amer: 43 mL/min — ABNORMAL LOW (ref >60)
Glucose, Bld: 103 mg/dL — ABNORMAL HIGH (ref 70–99)
Potassium: 4.4 mmol/L (ref 3.5–5.1)
Sodium: 140 mmol/L (ref 135–145)

## 2019-12-29 LAB — TROPONIN I (HIGH SENSITIVITY)
Troponin I (High Sensitivity): 31 ng/L — ABNORMAL HIGH (ref <18)
Troponin I (High Sensitivity): 250 ng/L (ref <18)
Troponin I (High Sensitivity): 593 ng/L (ref <18)
Troponin I (High Sensitivity): 1211 ng/L (ref <18)
Troponin I (High Sensitivity): 1993 ng/L (ref <18)

## 2019-12-29 LAB — BRAIN NATRIURETIC PEPTIDE: B Natriuretic Peptide: 898.1 pg/mL — ABNORMAL HIGH (ref 0.0–100.0)

## 2019-12-29 LAB — TYPE AND SCREEN
ABO/RH(D): O POS
Antibody Screen: NEGATIVE

## 2019-12-29 LAB — SARS CORONAVIRUS 2 BY RT PCR (HOSPITAL ORDER, PERFORMED IN ~~LOC~~ HOSPITAL LAB): SARS Coronavirus 2: NEGATIVE

## 2019-12-29 LAB — CA 125: Cancer Antigen (CA) 125: 19.7 U/mL (ref 0.0–38.1)

## 2019-12-29 MED ORDER — OXYCODONE HCL 5 MG PO TABS
5.0000 mg | ORAL_TABLET | Freq: Four times a day (QID) | ORAL | Status: DC | PRN
  Administered 2019-12-29 – 2019-12-30 (×2): 5 mg via ORAL
  Filled 2019-12-29 (×3): qty 1

## 2019-12-29 MED ORDER — ONDANSETRON HCL 4 MG/2ML IJ SOLN: 4.0000 mg | Freq: Three times a day (TID) | INTRAMUSCULAR | Status: DC | PRN

## 2019-12-29 MED ORDER — NITROGLYCERIN 0.4 MG SL SUBL: 0.4000 mg | SUBLINGUAL_TABLET | SUBLINGUAL | Status: DC | PRN

## 2019-12-29 MED ORDER — VITAMIN D 25 MCG (1000 UNIT) PO TABS
1000.0000 [IU] | ORAL_TABLET | Freq: Every day | ORAL | Status: DC
  Administered 2019-12-30: 1000 [IU] via ORAL
  Filled 2019-12-29 (×2): qty 1

## 2019-12-29 MED ORDER — ATORVASTATIN CALCIUM 10 MG PO TABS
10.0000 mg | ORAL_TABLET | Freq: Every evening | ORAL | Status: DC
  Administered 2019-12-29 – 2019-12-30 (×2): 10 mg via ORAL
  Filled 2019-12-29 (×2): qty 1

## 2019-12-29 MED ORDER — FUROSEMIDE 10 MG/ML IJ SOLN: 20.0000 mg | Freq: Two times a day (BID) | INTRAMUSCULAR | Status: DC

## 2019-12-29 MED ORDER — LORAZEPAM 0.5 MG PO TABS: 0.5000 mg | ORAL_TABLET | Freq: Every day | ORAL | Status: DC | PRN

## 2019-12-29 MED ORDER — PERFLUTREN LIPID MICROSPHERE
1.0000 mL | INTRAVENOUS | Status: AC | PRN
  Administered 2019-12-29: 2 mL via INTRAVENOUS
  Filled 2019-12-29: qty 10

## 2019-12-29 MED ORDER — IOHEXOL 350 MG/ML SOLN
60.0000 mL | Freq: Once | INTRAVENOUS | Status: AC | PRN
  Administered 2019-12-29: 60 mL via INTRAVENOUS

## 2019-12-29 MED ORDER — ASPIRIN EC 81 MG PO TBEC: 81.0000 mg | DELAYED_RELEASE_TABLET | Freq: Every day | ORAL | Status: DC

## 2019-12-29 MED ORDER — ENOXAPARIN SODIUM 40 MG/0.4ML ~~LOC~~ SOLN: 40.0000 mg | SUBCUTANEOUS | Status: DC

## 2019-12-29 MED ORDER — ACETAMINOPHEN 325 MG PO TABS: 650.0000 mg | ORAL_TABLET | Freq: Four times a day (QID) | ORAL | Status: DC | PRN

## 2019-12-29 MED ORDER — ASPIRIN 81 MG PO CHEW
324.0000 mg | CHEWABLE_TABLET | Freq: Once | ORAL | Status: AC
  Administered 2019-12-29: 324 mg via ORAL
  Filled 2019-12-29: qty 4

## 2019-12-29 MED ORDER — CHLORHEXIDINE GLUCONATE CLOTH 2 % EX PADS: 6.0000 | MEDICATED_PAD | Freq: Every day | CUTANEOUS | Status: DC

## 2019-12-29 MED ORDER — DOXYCYCLINE HYCLATE 100 MG PO TABS
100.0000 mg | ORAL_TABLET | Freq: Two times a day (BID) | ORAL | Status: DC
  Administered 2019-12-29 (×2): 100 mg via ORAL
  Filled 2019-12-29 (×3): qty 1

## 2019-12-29 MED ORDER — FUROSEMIDE 10 MG/ML IJ SOLN
20.0000 mg | Freq: Once | INTRAMUSCULAR | Status: AC
  Administered 2019-12-29: 20 mg via INTRAVENOUS
  Filled 2019-12-29: qty 4

## 2019-12-29 MED ORDER — HYDRALAZINE HCL 20 MG/ML IJ SOLN: 5.0000 mg | INTRAMUSCULAR | Status: DC | PRN

## 2019-12-29 MED ORDER — DULOXETINE HCL 30 MG PO CPEP
30.0000 mg | ORAL_CAPSULE | Freq: Every day | ORAL | Status: DC
  Filled 2019-12-29 (×2): qty 1

## 2019-12-29 MED ORDER — PANTOPRAZOLE SODIUM 20 MG PO TBEC
20.0000 mg | DELAYED_RELEASE_TABLET | Freq: Every day | ORAL | Status: DC
  Filled 2019-12-29: qty 1

## 2019-12-29 MED ORDER — SODIUM CHLORIDE 0.9% FLUSH: 10.0000 mL | Freq: Two times a day (BID) | INTRAVENOUS | Status: DC

## 2019-12-29 MED ORDER — DM-GUAIFENESIN ER 30-600 MG PO TB12: 1.0000 | ORAL_TABLET | Freq: Two times a day (BID) | ORAL | Status: DC | PRN

## 2019-12-29 MED ORDER — SODIUM CHLORIDE 0.9% FLUSH: 10.0000 mL | INTRAVENOUS | Status: DC | PRN

## 2019-12-29 MED ORDER — FUROSEMIDE 10 MG/ML IJ SOLN
20.0000 mg | Freq: Every day | INTRAMUSCULAR | Status: DC
  Filled 2019-12-29: qty 2

## 2019-12-29 MED ORDER — LIDOCAINE-PRILOCAINE 2.5-2.5 % EX CREA
1.0000 "application " | TOPICAL_CREAM | Freq: Every day | CUTANEOUS | Status: DC | PRN
  Filled 2019-12-29: qty 5

## 2019-12-29 MED ORDER — MORPHINE SULFATE (PF) 2 MG/ML IV SOLN: 1.0000 mg | INTRAVENOUS | Status: DC | PRN

## 2019-12-29 MED ORDER — ALBUTEROL SULFATE (2.5 MG/3ML) 0.083% IN NEBU: 2.5000 mg | INHALATION_SOLUTION | RESPIRATORY_TRACT | Status: DC | PRN

## 2019-12-29 NOTE — Consult Note (Signed)
Cardiology Consultation:   Patient ID: Jaime Hall MRN: [id number]; DOB: 1950/12/04  Admit date: 12/29/2019 Date of Consult: 12/29/2019  Primary Care Provider: [name], MD Primary Cardiologist: Jaime Sacramento, MD  Primary Electrophysiologist:  None    Patient Profile:   Jaime Hall is a 69 y.o. 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  History of Present Illness:   Jaime Hall is a 69 year old female with PMH as above.  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  She is taking the summer off but hopes to return to work in the fall.  She typically notes some lower extremity edema towards the end of her shift, resolving by morning and only occurring in her left leg where lymph nodes were previously reduced.  She is a former smoker.    She has a history of Hodgkin's lymphoma treated with radiation and chemotherapy to the chest area previously in remission since 2011.    She has CAD s/p 02/2012 angioplasty and DES to the pLAD with balloon angioplasty to the diagonal.  Treadmill nuclear stress test 02/2013 showed fixed anterior wall defect with minor reversibility and normal LVEF.  She had uterine cancer 2018 treated with surgery and chemotherapy.    She was diagnosed with stage Ib high-grade serous endometrial cancer with Port-A-Cath placed 10/27/2019.     Echo 11/02/2019 LVEF 30 to 35%, G1DD, mildly elevated PASP, mild to moderate MR/AI.  It was presumed she had chemotherapy-induced heart failure, given she received 8 rounds of Adriamycin during her treatment for Hodgkin's lymphoma 10 years earlier.  Her oncologist changed her  chemotherapy agent after this time and due to newly diagnosed heart failure.     Lexiscan was then performed to rule out ischemic cardiomyopathy and ruled high risk.  R/LHC 12/04/2019 with LVEF 30 to 35%, LVEDP moderately elevated, previous P LAD stent and patent, ostial/proximal LAD 40%.  She was started on Lasix 20 mg daily.  She was last seen in clinic 12/13/2019, at which time she was still undergoing chemotherapy with last chemotherapy treatment the previous Thursday.  Chemotherapy caused her to feel nauseous and fatigued for a few days.  She felt more lightheaded since starting Lasix, especially with position changes.  It was noted she had a tendency towards orthostasis in the past. Clinic BP low normal. Compression stockings and slow position changes were made. Lasix was reduced to 20mg  QOD.   Via [employer], and due to continued orthostasis, her furosemide was changed to Lasix PRN for LEE or if gaining 2 pounds overnight or 5 pounds in a week.  She was continued on metoprolol 25 mg twice daily.  Over the last few days, she noted LEE and took her lasix for three days (7/12-7/15) in a row due to this LEE. She is unsure if she noted any benefit from it. She has continued her BB. She notes ongoing "stars" in her vision.   Last chemotherapy treatment occurred yesterday 12/28/19.   She reports that this AM (7/16) she had chest pain that started at rest and radiated from the center to the right side of her chest. Associated sx included SOB and coughing. These sx  were constant and lasted a few hours and until she arrived in the [facility] ED. With the coughing, she had an episode of hemoptysis, which concerned her. She also reports seeing stars as above. No presyncope or syncope. No falls. No pnd, orthopnea, weight gain, or early satiety.   In the [facility] ED, renal function and electrolytes stable. BNP 898.1. HS Tn 31  250 and cycling. Also noted was anemia with consideration of hemoptysis and Hgb 8.7 with  Hct 27.1. BP soft to hypotensive with SBP 91-121 and DBP 50-60s. CXR without acute dz. HR 70-90s. EKG showed NSR with LBBB and previous EKGs significant for incomplete LBBB. CTA showed mild ground glass opacity in the lower lungs - pneumonitis from atypical infection or chemotherapy were considerations - and was negative for pulmonary embolism.    Heart Pathway Score:     Past Medical History:  Diagnosis Date  . Anxiety   . Cervicalgia   . Coronary artery disease    a. 02/2012 Stress echo: severe anterior wall ischemia;  b. 02/2012 Cath/PCI: LAD 95p (3.0 x 15 Xience EX DES), D1 90ost (PTCA - bifurcational dzs), EF 45% with anterior HK;  b. 02/2013 Ex MV: fixed anterior defect w/ minor reversibility, nl EF-->Med Rx.  . Endometrial cancer ([place])    a. 07/2016 s/p robotic hysterectomy, BSO w/ washings, sentinel node inj, mapping, bx, adhesiolysis.  . Essential hypertension, benign   . Fibrocystic breast disease   . GERD (gastroesophageal reflux disease)   . Gestational hypertension   . Heart murmur   . History of anemia   . History of blood transfusion   . Hodgkin's lymphoma ([place]) 2011   a. s/p radiation and chemo therapy  . Osteoarthritis   . Polycystic ovarian disease     Past Surgical History:  Procedure Laterality Date  . ABDOMINAL HYSTERECTOMY    . bladder sling    . CARDIAC CATHETERIZATION  02/2012   [facility] 1 stent place  . CERVICAL POLYPECTOMY    . CHOLECYSTECTOMY  1982  . COLONOSCOPY WITH PROPOFOL N/A 02/05/2015   Procedure: COLONOSCOPY WITH PROPOFOL;  Surgeon: Lucilla Lame, MD;  Location: [facility] ENDOSCOPY;  Service: Endoscopy;  Laterality: N/A;  . CORONARY ANGIOPLASTY  02/2012   left/right s/p balloon  . CYSTOGRAM N/A 08/17/2016   Procedure: CYSTOGRAM;  Surgeon: Hollice Espy, MD;  Location: [facility] ORS;  Service: Urology;  Laterality: N/A;  . CYSTOSCOPY N/A 08/17/2016   Procedure: CYSTOSCOPY EXAM UNDER ANESTHESIA;  Surgeon: Hollice Espy, MD;  Location: [facility] ORS;  Service: Urology;   Laterality: N/A;  . CYSTOSCOPY W/ RETROGRADES Bilateral 08/17/2016   Procedure: CYSTOSCOPY WITH RETROGRADE PYELOGRAM;  Surgeon: Hollice Espy, MD;  Location: [facility] ORS;  Service: Urology;  Laterality: Bilateral;  . CYSTOSCOPY WITH STENT PLACEMENT Right 08/17/2016   Procedure: CYSTOSCOPY WITH STENT PLACEMENT;  Surgeon: Hollice Espy, MD;  Location: [facility] ORS;  Service: Urology;  Laterality: Right;  . heart stent'  2013  . kidney stent Right 2018  . LYMPH NODE BIOPSY  2011   diagnosis of hodgkins lymphoma  . PELVIC LYMPH NODE DISSECTION N/A 07/29/2016   Procedure: PELVIC/AORTIC LYMPH NODE SAMPLING;  Surgeon: [name], MD;  Location: [facility] ORS;  Service: Gynecology;  Laterality: N/A;  . PORTA CATH INSERTION N/A 09/22/2016   Procedure: Glori Luis Cath Insertion;  Surgeon: [name], MD;  Location: [place] [facility];  Service: Cardiovascular;  Laterality: N/A;  . PORTA CATH INSERTION N/A 10/27/2019   Procedure: PORTA CATH INSERTION;  Surgeon: Delana Meyer,  Dolores Lory, MD;  Location: [place] [facility];  Service: Cardiovascular;  Laterality: N/A;  . PORTA CATH REMOVAL N/A 11/17/2016   Procedure: Glori Luis Cath Removal;  Surgeon: [name], MD;  Location: [place] [facility];  Service: Cardiovascular;  Laterality: N/A;  . RIGHT/LEFT HEART CATH AND CORONARY ANGIOGRAPHY N/A 12/04/2019   Procedure: RIGHT/LEFT HEART CATH AND CORONARY ANGIOGRAPHY;  Surgeon: Wellington Hampshire, MD;  Location: [place] [facility];  Service: Cardiovascular;  Laterality: N/A;  . ROBOTIC ASSISTED TOTAL HYSTERECTOMY WITH BILATERAL SALPINGO OOPHERECTOMY N/A 07/29/2016   Procedure: ROBOTIC ASSISTED TOTAL HYSTERECTOMY WITH BILATERAL SALPINGO OOPHORECTOMY;  Surgeon: [name], MD;  Location: [facility] ORS;  Service: Gynecology;  Laterality: N/A;  . SENTINEL NODE BIOPSY N/A 07/29/2016   Procedure: SENTINEL NODE BIOPSY;  Surgeon: [name], MD;  Location: [facility] ORS;  Service: Gynecology;  Laterality: N/A;    . transobturator sling N/A 2009   [place]     Home Medications:  Prior to Admission medications   Medication Sig Start Date End Date Taking? Authorizing Provider  aspirin EC 81 MG tablet Take 81 mg by mouth at bedtime.   Yes [provider]  atorvastatin (LIPITOR) 10 MG tablet Take 1 tablet by mouth once daily Patient taking differently: Take 10 mg by mouth every evening.  10/23/19  Yes Wellington Hampshire, MD  cholecalciferol (VITAMIN D3) 25 MCG (1000 UNIT) tablet Take 1,000 Units by mouth in the morning and at bedtime.   Yes [provider]  cyanocobalamin (,VITAMIN B-12,) 1000 MCG/ML injection INJECT 1ML IM WEEKLY FOR 4 WEEKS, THEN INJECT MONTHLY THEREAFTER Patient taking differently: Inject 1,000 mcg into the skin every 30 (thirty) days.  09/05/19  Yes Jaime Mc, MD  DULoxetine (CYMBALTA) 30 MG capsule Take 1 capsule by mouth once daily Patient taking differently: Take 30 mg by mouth daily.  10/22/19  Yes Sindy Guadeloupe, MD  furosemide (LASIX) 20 MG tablet Take 1 tablet (20 mg total) by mouth as needed for fluid or edema (Take one tablet daily as needed for lower extremity edema or for weight gain of 2 lbs overnight or 5 lbs in one week.). 12/20/19  Yes Loel Dubonnet, NP  lidocaine-prilocaine (EMLA) cream Apply to affected area once Patient taking differently: Apply 1 application topically daily as needed (Apply to affected area during port access).  10/25/19  Yes Sindy Guadeloupe, MD  LORazepam (ATIVAN) 0.5 MG tablet Take 1 tablet (0.5 mg total) by mouth every 6 (six) hours as needed (Nausea or vomiting). Patient taking differently: Take 0.5 mg by mouth daily as needed (Nausea or vomiting).  10/25/19  Yes Sindy Guadeloupe, MD  metoprolol tartrate (LOPRESSOR) 25 MG tablet Take 1 tablet by mouth twice daily Patient taking differently: Take 25 mg by mouth 2 (two) times daily.  08/04/19  Yes Wellington Hampshire, MD  ondansetron (ZOFRAN) 8 MG tablet Take 1 tablet (8 mg total) by  mouth 2 (two) times daily as needed (Nausea or vomiting). Patient taking differently: Take 8 mg by mouth 2 (two) times daily as needed for nausea or vomiting.  10/25/19  Yes Sindy Guadeloupe, MD  oxyCODONE (OXY IR/ROXICODONE) 5 MG immediate release tablet Take 1 tablet (5 mg total) by mouth every 6 (six) hours as needed for severe pain. 12/28/19  Yes Sindy Guadeloupe, MD  pantoprazole (PROTONIX) 20 MG tablet Take 1 tablet (20 mg total) by mouth daily. 12/20/19  Yes Jacquelin Hawking, NP  prochlorperazine (  COMPAZINE) 10 MG tablet Take 1 tablet (10 mg total) by mouth every 6 (six) hours as needed (Nausea or vomiting). Patient taking differently: Take 10 mg by mouth daily as needed for nausea or vomiting.  10/25/19  Yes Sindy Guadeloupe, MD    Inpatient Medications: Scheduled Meds: . aspirin  324 mg Oral Once  . furosemide  20 mg Intravenous Once   Continuous Infusions:  PRN Meds:   Allergies:    Allergies  Allergen Reactions  . Adhesive [Tape] Other (See Comments)    Burns the skin  . Z-Pak [Azithromycin] Itching  . Antifungal [Miconazole Nitrate] Rash  . Sulfa Antibiotics Nausea And Vomiting and Rash    N/T    Social History:   Social History   Socioeconomic History  . Marital status: Widowed    Spouse name: Not on file  . Number of children: Not on file  . Years of education: Not on file  . Highest education level: Not on file  Occupational History  . Not on file  Tobacco Use  . Smoking status: Former Smoker    Packs/day: 1.00    Years: 30.00    Pack years: 30.00    Types: Cigarettes    Quit date: 07/24/2002    Years since quitting: 17.4  . Smokeless tobacco: Never Used  . Tobacco comment: quit smoking in 2000  Vaping Use  . Vaping Use: Never used  Substance and Sexual Activity  . Alcohol use: Not Currently  . Drug use: No  . Sexual activity: Never  Other Topics Concern  . 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    Social Determinants of Health   Financial Resource Strain:   . Difficulty of Paying Living Expenses:   Food Insecurity:   . Worried About Charity fundraiser in the Last Year:   . Arboriculturist in the Last Year:   Transportation Needs:   . Film/video editor (Medical):   Marland Kitchen Lack of Transportation (Non-Medical):   Physical Activity:   . Days of Exercise per Week:   . Minutes of Exercise per Session:   Stress:   . Feeling of Stress :   Social Connections:   . Frequency of Communication with Friends and Family:   . Frequency of Social Gatherings with Friends and Family:   . Attends Religious Services:   . Active Member of Clubs or Organizations:   . Attends Archivist Meetings:   Marland Kitchen Marital Status:   Intimate Partner Violence:   . Fear of Current or Ex-Partner:   . Emotionally Abused:   Marland Kitchen Physically Abused:   . Sexually Abused:     Family History:    Family History  Problem Relation Age of Onset  . ALS Father   . Polymyositis Father   . Diabetes Brother   . Cancer Maternal Aunt        breast  . Breast cancer Maternal Aunt        30's  . Stroke Maternal Grandmother   . Cancer Maternal Grandfather        prostate  . Stroke Maternal Grandfather   . Colon cancer Maternal Aunt   . Non-Hodgkin's lymphoma Cousin      ROS:  Please see the history of present illness.  Review of Systems  Constitutional: Positive for malaise/fatigue.  Eyes:  Seeing "stars"  Respiratory: Positive for cough, hemoptysis, sputum production and shortness of breath.   Cardiovascular: Positive for chest pain and leg swelling. Negative for palpitations, orthopnea and PND.  Gastrointestinal: Positive for nausea.  Musculoskeletal: Negative for falls.  Neurological: Positive for weakness. Negative for dizziness and loss of consciousness.  All other systems reviewed and are negative.   All other ROS reviewed and negative.     Physical  Exam/Data:   Vitals:   12/29/19 0810 12/29/19 0830 12/29/19 0918 12/29/19 1007  BP: 120/69 (!) 118/57  (!) 121/56  Pulse:  75  72  Resp: (!) 39 (!) 24  (!) 23  Temp:   98 F (36.7 C)   TempSrc:   Oral   SpO2:  98%  100%   No intake or output data in the 24 hours ending 12/29/19 1100 Last 3 Weights 12/28/2019 12/14/2019 12/13/2019  Weight (lbs) 162 lb 160 lb 9 oz 159 lb 8 oz  Weight (kg) 73.483 kg 72.831 kg 72.349 kg     There is no height or weight on file to calculate BMI.  General:  Well nourished, well developed, in no acute distress. Joined by son. HEENT: normal Neck: no JVD Vascular: No carotid bruits; radial pulses 2+ bilaterally Cardiac:  normal S1, S2; RRR; 1/6 systolic murmur Lungs: slightly reduced breath sounds bilaterally, no wheezing, rhonchi or rales  Abd: soft, nontender, no hepatomegaly  Ext: mild nonpitting bilateral edema Musculoskeletal:  No deformities, BUE and BLE strength normal and equal Skin: warm and dry - RUSB with portacath Neuro:  No focal abnormalities noted Psych:  Normal affect   EKG:  The EKG was personally reviewed and demonstrates:  NSR, 90bpm, LBBB (QRS widened from previous incomplete LBBB), no acute ST/T changes noted, artifact and baseline wander noted.  Telemetry:  Telemetry was personally reviewed and demonstrates:  NSR  Relevant CV Studies:  12/04/2019 R/LHC  There is moderate to severe left ventricular systolic dysfunction.  LV end diastolic pressure is moderately elevated.  Previously placed Prox LAD stent (unknown type) is widely patent.  Ost LAD to Prox LAD lesion is 40% stenosed. 1.  Patent LAD stent with no significant restenosis.  There is moderate stenosis in ostial LAD before the stent.  No evidence of obstructive disease overall. 2.  Moderately to severely reduced LV systolic function with an EF of 30 to 35%. 3.  Right heart catheterization showed mildly elevated filling pressures with pulmonary capillary wedge pressure of  18 mmHg, mild to moderate pulmonary hypertension at 44/19 with a mean of 33 mmHg, and mildly reduced cardiac output at 3.89 with a cardiac index of 2.24.  Pulmonary vascular resistance is 3.8 Woods units Recommendations: Continue medical therapy for coronary artery disease. 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  She is mildly volume overloaded and I added small dose furosemide. Recommend medical therapy for cardiomyopathy.  Echo 11/02/2019 1. Left ventricular ejection fraction, by estimation, is 30 to 35%. The  left ventricle has moderately decreased function. The left ventricle  demonstrates global hypokinesis. Left ventricular diastolic parameters are  consistent with Grade I diastolic  dysfunction (impaired relaxation).  2. Right ventricular systolic function is normal. The right ventricular  size is normal. There is mildly elevated pulmonary artery systolic  pressure. The estimated right ventricular systolic pressure is 02.7 mmHg.  3. Mild to moderate mitral valve regurgitation.  4. Aortic valve regurgitation is mild to moderate.    Laboratory Data:  High Sensitivity Troponin:  Recent Labs  Lab 12/29/19 0625 12/29/19 0924  TROPONINIHS 31* 250*     Cardiac EnzymesNo results for input(s): TROPONINI in the last 168 hours. No results for input(s): TROPIPOC in the last 168 hours.  Chemistry Recent Labs  Lab 12/28/19 0751 12/29/19 0625  NA 141 140  K 3.9 4.4  CL 106 105  CO2 27 26  GLUCOSE 100* 103*  BUN 14 18  CREATININE 1.36* 1.26*  CALCIUM 8.3* 8.5*  GFRNONAA 39* 43*  GFRAA 46* 50*  ANIONGAP 8 9    Recent Labs  Lab 12/28/19 0751  PROT 5.9*  ALBUMIN 3.3*  AST 26  ALT 25  ALKPHOS 113  BILITOT 0.7   Hematology Recent Labs  Lab 12/28/19 0751 12/29/19 0625  WBC 9.4 17.4*  RBC 2.69* 2.89*  HGB 8.1* 8.7*  HCT 24.9* 27.1*  MCV 92.6 93.8  MCH 30.1 30.1  MCHC 32.5 32.1  RDW 19.3* 19.9*  PLT 146* 218    BNP Recent Labs  Lab 12/29/19 0625  BNP 898.1*    DDimer No results for input(s): DDIMER in the last 168 hours.   Radiology/Studies:  CT Angio Chest PE W and/or Wo Contrast  Result Date: 12/29/2019 CLINICAL DATA:  Shortness of breath EXAM: CT ANGIOGRAPHY CHEST WITH CONTRAST TECHNIQUE: Multidetector CT imaging of the chest was performed using the standard protocol during bolus administration of intravenous contrast. Multiplanar CT image reconstructions and MIPs were obtained to evaluate the vascular anatomy. CONTRAST:  39mL OMNIPAQUE IOHEXOL 350 MG/ML SOLN COMPARISON:  Head CT 10/18/2019 FINDINGS: Cardiovascular: Normal heart size. No pericardial effusion. Coronary stenting. No pulmonary artery filling defect. Atherosclerotic calcification of the aorta. Mediastinum/Nodes: Paramediastinal fibrosis. There is history of Hodgkin's lymphoma. No enlarged lymph nodes currently. Lungs/Pleura: Perihilar radiation fibrosis. Somewhat centrilobular faint ground-glass density in the lower lungs not seen on comparison PET-CT. Small nodule at the right minor fissure, stable. No consolidation, effusion, or pneumothorax. Tiny fatty Bochdalek's hernia on the left Upper Abdomen: Cholecystectomy. Musculoskeletal: No acute or aggressive finding Review of the MIP images confirms the above findings. IMPRESSION: Mild ground-glass opacity in the lower lungs which is nonspecific. Pneumonitis from atypical infection or patient's chemotherapy are considerations. Negative for pulmonary embolism. Electronically Signed   By: Monte Fantasia M.D.   On: 12/29/2019 08:39   DG Chest Portable 1 View  Result Date: 12/29/2019 CLINICAL DATA:  69 year old female with history of chest pain. EXAM: PORTABLE CHEST 1 VIEW COMPARISON:  Chest x-ray 08/05/2016. FINDINGS: There is a right-sided internal jugular central venous catheter with tip terminating in the distal superior vena cava. Lung volumes are normal. Paramediastinal radiation  fibrosis again noted. No consolidative airspace disease. No pleural effusions. No pneumothorax. No pulmonary nodule or mass noted. Pulmonary vasculature and the cardiomediastinal silhouette are within normal limits. Atherosclerosis in the thoracic aorta. IMPRESSION: 1. Support apparatus, as above. 2. No radiographic evidence of acute cardiopulmonary disease. 3. Postradiation changes in the chest, similar to the prior study. 4. Aortic atherosclerosis. Electronically Signed   By: Vinnie Langton M.D.   On: 12/29/2019 06:51    Assessment and Plan:   Chest pain with Elevated HS Tn CAD s/p stenting and angioplasty --No current CP. Earlier CP, SOB, and cough. Hemoptysis x1.   --LHC 11/2019 with patent stent and 40% stenosis prior to stent. Reduced LVSF/CO and elevated LVEDP and PCWP.  --Consider CP as multifactorial in the setting of her comorbid conditions, including pulmonary HTN, low output HF with known reduced CO, valular dz. Known elevated  LVEDP with diuresis difficulty given orthostasis and chemotherapy tx. Also considered is anemia with hemoptysis / anemia, as well as imaging suspicious for lung changes 2/2 atypical infection versus chemotherapy.  --HS Tn still cycling. 31  250. Cycle until peaked, down-trending. EKG shows progression of LBBB from incomplete to complete but otherwise no acute changes. Suspect supply demand ischemia.  --At this time, no indication for emergent ischemic workup or heparin.  --Continue current medications as BP and renal function allow.   HFrEF --Resolution of SOB with resolution of CP and cough. Recently took lasix for 3 days in a row due to LEE. PTA PRN lasix given orthostasis sx.  --Diuresis has been complicated by orthostasis/BP and chemotherapy. --Euvolemic and well compensated on exam. --Monitor I/Os, daily weights. --Daily BMET. Current renal function stable. --Continue current medications as BP and renal function allow.   Hemoptysis --Occurred after  start of CP. Anemic on presentation. Consider as contributing to sx. Further workup of anemia / possible pneumonitis per IM.  Hypotension History of HTN --BP soft to hypotensive. Likely contributing to her orthostasis sx.  --Caution with lasix. Continue to hold BB for now.  --Avoid pre-renal AKI.   Valvular heart dz:  Mild to moderate MR/AR --Most recent echo as above. Could contribute to her symptoms of orthostasis. Continue to monitor with periodic outpatient echos.   Anemia --Daily CBC. As above, could contribute to her CP, LEE, and SOB. Consider also her hemoptysis with further workup per IM.  Hypoalbuminemia --Likely contributing to sx. Monitor. Per IM.   HLD --Continue statin.   For questions or updates, please contact [place] Please consult [email or web address] for contact info under     Signed, Arvil Chaco, PA-C  12/29/2019 11:00 AM

## 2019-12-29 NOTE — ED Notes (Signed)
Attempted to call report- RN busy with transfer per unit- extension provided for call-back.

## 2019-12-29 NOTE — ED Triage Notes (Signed)
Pt arrived via EMS from home where she woke at 0400 with central chest pain and coffee ground sputum and SOB. Pt currently receiving chemotherapy routinely, last treatment on 7/15. No fever reported. Last blood work revealed low hemoglobin of 8.1.

## 2019-12-29 NOTE — H&P (Addendum)
History and Physical    Jaime Hall ENI:[id number] DOB: [birth year] DOA: 12/29/2019  Referring MD/NP/PA:   PCP: [name], MD   Patient coming from:  The patient is coming from home.  At baseline, pt is independent for most of ADL.        Chief Complaint: chest pain and SOB  HPI: Jaime Hall is a 69 y.o. female with medical history significant of hypertension, hyperlipidemia, GERD, depression, Hodgkin lymphoma, endometrial cancer on chemotherapy (last dose was on 12/28/19), CAD, DES stent placement, PVD, CKD stage III, CHF with EF 30-35%, bronchiectasis, who presents with chest pain and SOB  Patient states that her chest pain started about 4 AM which woke him up.  Patient also has shortness breath and cough. She states she began coughing up small amounts of clear mucus, but then coffee-ground mucus. The chest pain is located in the front chest, sharp, moderate, pleuritic, nonradiating, initially 7 out of 10 in severity, currently chest pain-free. Patient does not have fever or chills.  She has nausea, no vomiting, diarrhea or abdominal pain no symptoms of UTI or unilateral weakness. She had bilateral leg swelling which she attributes to congestive heart failure which was diagnosed due to her chemotherapy. She stated that she did not take her Lasix due to lightheadedness.  ED Course: pt was found to have trop 31 -->250, WBC 17.4, BNP 898, pending COVID-19 PCR, stable renal function, temperature normal, blood pressure 121/56, heart rate 72, tachypnea, oxygen saturation 96% on room air.  Chest x-ray negative.  Patient is placed on progressive plan for obs. Dr. Rockey Situ of cardiology is consulted.  CTA of chest: 1. Mild ground-glass opacity in the lower lungs which is nonspecific. Pneumonitis from atypical infection or patient's chemotherapy are Considerations. 2. Negative for pulmonary embolism.  Review of Systems:   General: no fevers, chills, no body weight gain,  has  fatigue HEENT: no blurry vision, hearing changes or sore throat Respiratory: has dyspnea, coughing, no wheezing CV: has chest pain, no palpitations GI: has nausea, no vomiting, abdominal pain, diarrhea, constipation GU: no dysuria, burning on urination, increased urinary frequency, hematuria  Ext: has leg edema Neuro: no unilateral weakness, numbness, or tingling, no vision change or hearing loss Skin: no rash, no skin tear. MSK: No muscle spasm, no deformity, no limitation of range of movement in spin Heme: No easy bruising.  Travel history: No recent long distant travel.  Allergy:  Allergies  Allergen Reactions  . Adhesive [Tape] Other (See Comments)    Burns the skin  . Z-Pak [Azithromycin] Itching  . Antifungal [Miconazole Nitrate] Rash  . Sulfa Antibiotics Nausea And Vomiting and Rash    N/T    Past Medical History:  Diagnosis Date  . Anxiety   . Cervicalgia   . Coronary artery disease    a. 02/2012 Stress echo: severe anterior wall ischemia;  b. 02/2012 Cath/PCI: LAD 95p (3.0 x 15 Xience EX DES), D1 90ost (PTCA - bifurcational dzs), EF 45% with anterior HK;  b. 02/2013 Ex MV: fixed anterior defect w/ minor reversibility, nl EF-->Med Rx.  . Endometrial cancer ([place])    a. 07/2016 s/p robotic hysterectomy, BSO w/ washings, sentinel node inj, mapping, bx, adhesiolysis.  . Essential hypertension, benign   . Fibrocystic breast disease   . GERD (gastroesophageal reflux disease)   . Gestational hypertension   . Heart murmur   . History of anemia   . History of blood transfusion   . Hodgkin's lymphoma ([place])  2011   a. s/p radiation and chemo therapy  . Osteoarthritis   . Polycystic ovarian disease     Past Surgical History:  Procedure Laterality Date  . ABDOMINAL HYSTERECTOMY    . bladder sling    . CARDIAC CATHETERIZATION  02/2012   [facility] 1 stent place  . CERVICAL POLYPECTOMY    . CHOLECYSTECTOMY  1982  . COLONOSCOPY WITH PROPOFOL N/A 02/05/2015   Procedure: COLONOSCOPY  WITH PROPOFOL;  Surgeon: Lucilla Lame, MD;  Location: [facility];  Service: Endoscopy;  Laterality: N/A;  . CORONARY ANGIOPLASTY  02/2012   left/right s/p balloon  . CYSTOGRAM N/A 08/17/2016   Procedure: CYSTOGRAM;  Surgeon: Hollice Espy, MD;  Location: [facility];  Service: Urology;  Laterality: N/A;  . CYSTOSCOPY N/A 08/17/2016   Procedure: CYSTOSCOPY EXAM UNDER ANESTHESIA;  Surgeon: Hollice Espy, MD;  Location: [facility];  Service: Urology;  Laterality: N/A;  . CYSTOSCOPY W/ RETROGRADES Bilateral 08/17/2016   Procedure: CYSTOSCOPY WITH RETROGRADE PYELOGRAM;  Surgeon: Hollice Espy, MD;  Location: [facility];  Service: Urology;  Laterality: Bilateral;  . CYSTOSCOPY WITH STENT PLACEMENT Right 08/17/2016   Procedure: CYSTOSCOPY WITH STENT PLACEMENT;  Surgeon: Hollice Espy, MD;  Location: [facility];  Service: Urology;  Laterality: Right;  . heart stent'  2013  . kidney stent Right 2018  . LYMPH NODE BIOPSY  2011   diagnosis of hodgkins lymphoma  . PELVIC LYMPH NODE DISSECTION N/A 07/29/2016   Procedure: PELVIC/AORTIC LYMPH NODE SAMPLING;  Surgeon: [name], MD;  Location: [facility];  Service: Gynecology;  Laterality: N/A;  . PORTA CATH INSERTION N/A 09/22/2016   Procedure: Glori Luis Cath Insertion;  Surgeon: [name], MD;  Location: [place] [facility];  Service: Cardiovascular;  Laterality: N/A;  . PORTA CATH INSERTION N/A 10/27/2019   Procedure: PORTA CATH INSERTION;  Surgeon: [name], MD;  Location: [place] [facility];  Service: Cardiovascular;  Laterality: N/A;  . PORTA CATH REMOVAL N/A 11/17/2016   Procedure: Glori Luis Cath Removal;  Surgeon: [name], MD;  Location: [place] [facility];  Service: Cardiovascular;  Laterality: N/A;  . RIGHT/LEFT HEART CATH AND CORONARY ANGIOGRAPHY N/A 12/04/2019   Procedure: RIGHT/LEFT HEART CATH AND CORONARY ANGIOGRAPHY;  Surgeon: Wellington Hampshire, MD;  Location: [place] [facility];  Service: Cardiovascular;  Laterality:  N/A;  . ROBOTIC ASSISTED TOTAL HYSTERECTOMY WITH BILATERAL SALPINGO OOPHERECTOMY N/A 07/29/2016   Procedure: ROBOTIC ASSISTED TOTAL HYSTERECTOMY WITH BILATERAL SALPINGO OOPHORECTOMY;  Surgeon: [name], MD;  Location: [facility];  Service: Gynecology;  Laterality: N/A;  . SENTINEL NODE BIOPSY N/A 07/29/2016   Procedure: SENTINEL NODE BIOPSY;  Surgeon: [name], MD;  Location: [facility];  Service: Gynecology;  Laterality: N/A;  . transobturator sling N/A 2009   [place]    Social History:  reports that she quit smoking about 17 years ago. Her smoking use included cigarettes. She has a 30.00 pack-year smoking history. She has never used smokeless tobacco. She reports previous alcohol use. She reports that she does not use drugs.  Family History:  Family History  Problem Relation Age of Onset  . ALS Father   . Polymyositis Father   . Diabetes Brother   . Cancer Maternal Aunt        breast  . Breast cancer Maternal Aunt        30's  . Stroke Maternal Grandmother   . Cancer Maternal Grandfather        prostate  . Stroke  Maternal Grandfather   . Colon cancer Maternal Aunt   . Non-Hodgkin's lymphoma Cousin      Prior to Admission medications   Medication Sig Start Date End Date Taking? Authorizing Provider  aspirin EC 81 MG tablet Take 81 mg by mouth at bedtime.    [provider]  atorvastatin (LIPITOR) 10 MG tablet Take 1 tablet by mouth once daily Patient taking differently: Take 10 mg by mouth every evening.  10/23/19   Wellington Hampshire, MD  cholecalciferol (VITAMIN D3) 25 MCG (1000 UNIT) tablet Take 1,000 Units by mouth in the morning and at bedtime.    [provider]  cyanocobalamin (,VITAMIN B-12,) 1000 MCG/ML injection INJECT 1ML IM WEEKLY FOR 4 WEEKS, THEN INJECT MONTHLY THEREAFTER Patient taking differently: Inject 1,000 mcg into the skin every 30 (thirty) days.  09/05/19   [name], MD  diazepam (VALIUM) 5 MG tablet Take 1  tablet (5 mg total) by mouth every 12 (twelve) hours as needed for anxiety. Patient not taking: Reported on 12/28/2019 10/15/17   [name], MD  DULoxetine (CYMBALTA) 30 MG capsule Take 1 capsule by mouth once daily Patient taking differently: Take 30 mg by mouth daily.  10/22/19   Sindy Guadeloupe, MD  furosemide (LASIX) 20 MG tablet Take 1 tablet (20 mg total) by mouth as needed for fluid or edema (Take one tablet daily as needed for lower extremity edema or for weight gain of 2 lbs overnight or 5 lbs in one week.). 12/20/19   Loel Dubonnet, NP  lidocaine-prilocaine (EMLA) cream Apply to affected area once Patient taking differently: Apply 1 application topically daily as needed (Apply to affected area during port access).  10/25/19   Sindy Guadeloupe, MD  LORazepam (ATIVAN) 0.5 MG tablet Take 1 tablet (0.5 mg total) by mouth every 6 (six) hours as needed (Nausea or vomiting). Patient taking differently: Take 0.5 mg by mouth daily as needed (Nausea or vomiting).  10/25/19   Sindy Guadeloupe, MD  metoprolol tartrate (LOPRESSOR) 25 MG tablet Take 1 tablet by mouth twice daily Patient taking differently: Take 25 mg by mouth 2 (two) times daily.  08/04/19   Wellington Hampshire, MD  nitroGLYCERIN (NITROSTAT) 0.4 MG SL tablet Place 1 tablet (0.4 mg total) under the tongue every 5 (five) minutes as needed for chest pain. Patient not taking: Reported on 12/28/2019 02/22/13   Minna Merritts, MD  nystatin (MYCOSTATIN/NYSTOP) powder Apply 1 application topically 2 (two) times daily. To rash until resolved. Patient taking differently: Apply 1 application topically daily as needed (rash).  10/02/19   [name], MD  ondansetron (ZOFRAN) 8 MG tablet Take 1 tablet (8 mg total) by mouth 2 (two) times daily as needed (Nausea or vomiting). Patient taking differently: Take 8 mg by mouth 2 (two) times daily as needed for nausea or vomiting.  10/25/19   Sindy Guadeloupe, MD  OVER THE COUNTER MEDICATION Take 1 tablet by  mouth daily as needed (reflux). crystallized ginger    [provider]  oxyCODONE (OXY IR/ROXICODONE) 5 MG immediate release tablet Take 1 tablet (5 mg total) by mouth every 6 (six) hours as needed for severe pain. 12/28/19   Sindy Guadeloupe, MD  pantoprazole (PROTONIX) 20 MG tablet Take 1 tablet (20 mg total) by mouth daily. 12/20/19   Jacquelin Hawking, NP  prochlorperazine (COMPAZINE) 10 MG tablet Take 1 tablet (10 mg total) by mouth every 6 (six) hours as needed (  Nausea or vomiting). Patient taking differently: Take 10 mg by mouth daily as needed for nausea or vomiting.  10/25/19   Sindy Guadeloupe, MD  Syringe/Needle, Disp, (SYRINGE 3CC/25GX1") 25G X 1" 3 ML MISC Use for b12 injections 03/08/19   [name], MD    Physical Exam: Vitals:   12/29/19 0918 12/29/19 1007 12/29/19 1100 12/29/19 1130  BP:  (!) 121/56 (!) 91/59 (!) 110/56  Pulse:  72 78 73  Resp:  (!) 23 16 20   Temp: 98 F (36.7 C)     TempSrc: Oral     SpO2:  100% 100% 99%   General: Not in acute distress HEENT:       Eyes: PERRL, EOMI, no scleral icterus.       ENT: No discharge from the ears and nose, no pharynx injection, no tonsillar enlargement.        Neck: positive JVD, no bruit, no mass felt. Heme: No neck lymph node enlargement. Cardiac: S1/S2, RRR, soft systolic murmur, No gallops or rubs. Respiratory: No rales, wheezing, rhonchi or rubs. GI: Soft, nondistended, nontender, no rebound pain, no organomegaly, BS present. GU: No hematuria Ext: 1+ pitting leg edema bilaterally. 1+DP/PT pulse bilaterally. Musculoskeletal: No joint deformities, No joint redness or warmth, no limitation of ROM in spin. Skin: No rashes.  Neuro: Alert, oriented X3, cranial nerves II-XII grossly intact, moves all extremities normally. Psych: Patient is not psychotic, no suicidal or hemocidal ideation.  Labs on Admission: I have personally reviewed following labs and imaging studies  CBC: Recent Labs  Lab 12/28/19 0751  12/29/19 0625  WBC 9.4 17.4*  NEUTROABS 6.9  --   HGB 8.1* 8.7*  HCT 24.9* 27.1*  MCV 92.6 93.8  PLT 146* 270   Basic Metabolic Panel: Recent Labs  Lab 12/28/19 0751 12/29/19 0625  NA 141 140  K 3.9 4.4  CL 106 105  CO2 27 26  GLUCOSE 100* 103*  BUN 14 18  CREATININE 1.36* 1.26*  CALCIUM 8.3* 8.5*   GFR: Estimated Creatinine Clearance: 38.1 mL/min (A) (by C-G formula based on SCr of 1.26 mg/dL (H)). Liver Function Tests: Recent Labs  Lab 12/28/19 0751  AST 26  ALT 25  ALKPHOS 113  BILITOT 0.7  PROT 5.9*  ALBUMIN 3.3*   No results for input(s): LIPASE, AMYLASE in the last 168 hours. No results for input(s): AMMONIA in the last 168 hours. Coagulation Profile: No results for input(s): INR, PROTIME in the last 168 hours. Cardiac Enzymes: No results for input(s): CKTOTAL, CKMB, CKMBINDEX, TROPONINI in the last 168 hours. BNP (last 3 results) No results for input(s): PROBNP in the last 8760 hours. HbA1C: No results for input(s): HGBA1C in the last 72 hours. CBG: No results for input(s): GLUCAP in the last 168 hours. Lipid Profile: No results for input(s): CHOL, HDL, LDLCALC, TRIG, CHOLHDL, LDLDIRECT in the last 72 hours. Thyroid Function Tests: No results for input(s): TSH, T4TOTAL, FREET4, T3FREE, THYROIDAB in the last 72 hours. Anemia Panel: No results for input(s): VITAMINB12, FOLATE, FERRITIN, TIBC, IRON, RETICCTPCT in the last 72 hours. Urine analysis:    Component Value Date/Time   COLORURINE YELLOW (A) 08/05/2016 1950   APPEARANCEUR Clear 09/24/2016 0945   LABSPEC 1.018 08/05/2016 1950   PHURINE 6.0 08/05/2016 1950   GLUCOSEU Negative 09/24/2016 0945   GLUCOSEU NEGATIVE 07/07/2016 1418   HGBUR MODERATE (A) 08/05/2016 1950   BILIRUBINUR negative 10/02/2019 1627   BILIRUBINUR Negative 09/24/2016 0945   KETONESUR 5 (A) 08/05/2016 1950  PROTEINUR Negative 10/02/2019 1627   PROTEINUR Negative 09/24/2016 0945   PROTEINUR 100 (A) 08/05/2016 1950    UROBILINOGEN 0.2 10/02/2019 1627   UROBILINOGEN 0.2 07/07/2016 1418   NITRITE negatvie 10/02/2019 1627   NITRITE Negative 09/24/2016 0945   NITRITE NEGATIVE 08/05/2016 1950   LEUKOCYTESUR Negative 10/02/2019 1627   LEUKOCYTESUR Negative 09/24/2016 0945   Sepsis Labs: @LABRCNTIP (procalcitonin:4,lacticidven:4) )No results found for this or any previous visit (from the past 240 hour(s)).   Radiological Exams on Admission: CT Angio Chest PE W and/or Wo Contrast  Result Date: 12/29/2019 CLINICAL DATA:  Shortness of breath EXAM: CT ANGIOGRAPHY CHEST WITH CONTRAST TECHNIQUE: Multidetector CT imaging of the chest was performed using the standard protocol during bolus administration of intravenous contrast. Multiplanar CT image reconstructions and MIPs were obtained to evaluate the vascular anatomy. CONTRAST:  73mL OMNIPAQUE IOHEXOL 350 MG/ML SOLN COMPARISON:  Head CT 10/18/2019 FINDINGS: Cardiovascular: Normal heart size. No pericardial effusion. Coronary stenting. No pulmonary artery filling defect. Atherosclerotic calcification of the aorta. Mediastinum/Nodes: Paramediastinal fibrosis. There is history of Hodgkin's lymphoma. No enlarged lymph nodes currently. Lungs/Pleura: Perihilar radiation fibrosis. Somewhat centrilobular faint ground-glass density in the lower lungs not seen on comparison PET-CT. Small nodule at the right minor fissure, stable. No consolidation, effusion, or pneumothorax. Tiny fatty Bochdalek's hernia on the left Upper Abdomen: Cholecystectomy. Musculoskeletal: No acute or aggressive finding Review of the MIP images confirms the above findings. IMPRESSION: Mild ground-glass opacity in the lower lungs which is nonspecific. Pneumonitis from atypical infection or patient's chemotherapy are considerations. Negative for pulmonary embolism. Electronically Signed   By: Monte Fantasia M.D.   On: 12/29/2019 08:39   DG Chest Portable 1 View  Result Date: 12/29/2019 CLINICAL DATA:   69 year old female with history of chest pain. EXAM: PORTABLE CHEST 1 VIEW COMPARISON:  Chest x-ray 08/05/2016. FINDINGS: There is a right-sided internal jugular central venous catheter with tip terminating in the distal superior vena cava. Lung volumes are normal. Paramediastinal radiation fibrosis again noted. No consolidative airspace disease. No pleural effusions. No pneumothorax. No pulmonary nodule or mass noted. Pulmonary vasculature and the cardiomediastinal silhouette are within normal limits. Atherosclerosis in the thoracic aorta. IMPRESSION: 1. Support apparatus, as above. 2. No radiographic evidence of acute cardiopulmonary disease. 3. Postradiation changes in the chest, similar to the prior study. 4. Aortic atherosclerosis. Electronically Signed   By: Vinnie Langton M.D.   On: 12/29/2019 06:51     EKG: Independently reviewed.  Sinus rhythm, QTC 486, poor quality of EKG strip, mild ST depression in V4-V6  Assessment/Plan Principal Problem:   Chest pain Active Problems:   Coronary artery disease   Endometrial cancer ([facility])   Acute on chronic combined systolic and diastolic CHF (congestive heart failure) ([facility])   Leukocytosis   HTN (hypertension)   HLD (hyperlipidemia)   Elevated troponin   CKD (chronic kidney disease), stage IIIa   Depression with anxiety   Chest pain and elevated trop and hx of CAD: s/p of DES stent. Trop 31 -->250. Currently chest pain free.  Patient may have demand ischemia secondary to CHF exacerbation.  Dr. Rockey Situ of cardiology is consulted.  - place to progressive unit for observation - Trend Trop - Repeat EKG in the am  - prn Nitroglycerin, and aspirin, lipitor, metoprolol - Risk factor stratification: will check FLP and A1C  - 2d echo  Acute on chronic combined systolic and diastolic CHF (congestive heart failure) [facility]): Patient has 1+ leg edema, positive JVD, elevated BNP 898, consistent with  CHF exacerbation.  2D echo on 11/02/2019 showed EF of  30-35%. -Lasix 20 mg daily by IV -2d echo -Daily weights -strict I/O's -Low salt diet -Fluid restriction -Obtain REDs Vest reading  Endometrial cancer [facility]): s/p of hysterectomy.  Currently on chemotherapy, last dose was yesterday. -f/u with oncology  Leukocytosis: WBC 17.4, no fever.  Cannot completely rule out atypical infection given groundglass appearance on CTA of chest.  Patient is immunosuppressed due to chemotherapy.  Will start empiric antibiotics doxycycline. -Doxycycline 100 mg twice daily -Follow-up blood culture and sputum culture  HTN (hypertension): bp is soft. -hold Metoprolol -IV hydralazine as needed  HLD (hyperlipidemia) -lipitor  CKD (chronic kidney disease), stage IIIa: stable -f/u by BMP  Depression and anxiety: Stable, no suicidal or homicidal ideations. -Continue home medications     DVT ppx: SCD Code Status: Full code Family Communication:  Yes, patient's son   at bed side Disposition Plan:  Anticipate discharge back to previous environment Consults called:  Dr. Rockey Situ of card Admission status:  progressive unit for obs     Status is: Observation  The patient remains OBS appropriate and will d/c before 2 midnights.  Dispo: The patient is from: Home              Anticipated d/c is to: Home              Anticipated d/c date is: 1 day              Patient currently is not medically stable to d/c.           Date of Service 12/29/2019    Ivor Costa [facility]   If 7PM-7AM, please contact night-coverage [email or web address] 12/29/2019, 12:55 PM

## 2019-12-29 NOTE — ED Notes (Signed)
ED tech to transport pt.

## 2019-12-29 NOTE — Progress Notes (Signed)
PT Cancellation Note  Patient Details Name: Jaime Hall MRN: [id number] DOB: 03/24/1951   Cancelled Treatment:    Reason Eval/Treat Not Completed: Medical issues which prohibited therapy;Other (comment) (Patient consult received and reviewed. Patient troponin increasingly trending above therapeutic value for activity (last reading level at 14:32 is 1,211). Will attempt again at later time/date as appropriate/available.)  Janna Arch, PT, DPT   12/29/2019, 4:09 PM

## 2019-12-29 NOTE — Progress Notes (Signed)
*  PRELIMINARY RESULTS* Echocardiogram 2D Echocardiogram has been performed.  Jaime Hall 12/29/2019, 6:32 PM

## 2019-12-29 NOTE — ED Notes (Signed)
Ultrasound technician in room

## 2019-12-29 NOTE — ED Provider Notes (Signed)
[facility] Emergency Department Provider Note  ____________________________________________   First MD Initiated Contact with Patient 12/29/19 [phone]     (approximate)  I have reviewed the triage vital signs and the nursing notes.   HISTORY  Chief Complaint Chest Pain    HPI Jaime Hall is a 69 y.o. female with extensive past medical history as below including metastatic endometrial cancer here with cough and shortness of breath.  The patient states that she received chemotherapy yesterday.  She was feeling well until this morning.  She woke up with a sharp, somewhat pleuritic chest pain and associated cough.  She then became more short of breath at rest.  She felt like she could not catch her breath.  She states she then began coughing up small amounts of largely clear but then bloody sputum.  She had a small clot in this.  She states her chest pain and shortness of breath worsen so she presents for evaluation.  Her chest pain is improving, but she continues to feel some mild chest pressure.  Denies any history of similar symptoms.  No history of DVT/PE.  She did not receive any new medications yesterday during her chemotherapy session.  She does not believe she is had any fever.  She had some mild bilateral leg swelling which she attributes to congestive heart failure which was diagnosed due to her chemotherapy.  No specific alleviating factors.        Past Medical History:  Diagnosis Date  . Anxiety   . Cervicalgia   . Coronary artery disease    a. 02/2012 Stress echo: severe anterior wall ischemia;  b. 02/2012 Cath/PCI: LAD 95p (3.0 x 15 Xience EX DES), D1 90ost (PTCA - bifurcational dzs), EF 45% with anterior HK;  b. 02/2013 Ex MV: fixed anterior defect w/ minor reversibility, nl EF-->Med Rx.  . Endometrial cancer ([place])    a. 07/2016 s/p robotic hysterectomy, BSO w/ washings, sentinel node inj, mapping, bx, adhesiolysis.  . Essential hypertension, benign    . Fibrocystic breast disease   . GERD (gastroesophageal reflux disease)   . Gestational hypertension   . Heart murmur   . History of anemia   . History of blood transfusion   . Hodgkin's lymphoma ([place]) 2011   a. s/p radiation and chemo therapy  . Osteoarthritis   . Polycystic ovarian disease     Patient Active Problem List   Diagnosis Date Noted  . Chest pain 12/29/2019  . Leukocytosis 12/29/2019  . HTN (hypertension) 12/29/2019  . HLD (hyperlipidemia) 12/29/2019  . Elevated troponin 12/29/2019  . CKD (chronic kidney disease), stage IIIa 12/29/2019  . Depression with anxiety 12/29/2019  . Abnormal cardiovascular stress test   . Acute on chronic combined systolic and diastolic CHF (congestive heart failure) ([place])   . Dyspnea on exertion   . Goals of care, counseling/discussion 10/20/2019  . 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 Peritoneal metastases ([place]) 10/16/2019  . Flank pain 10/04/2019  . Right lower quadrant abdominal pain 10/04/2019  . Candidiasis 10/04/2019  . Right anterior knee pain 07/25/2019  . Educated about COVID-19 virus infection 11/15/2018  . Cramps, muscle, general 04/06/2018  . Chronic kidney disease, stage 2, mildly decreased GFR 10/05/2017  . B12 deficiency anemia 07/24/2017  . Normocytic anemia 07/18/2017  . Back pain 07/18/2017  . Chemotherapy-induced peripheral neuropathy ([place]) 12/10/2016  . Hyperlipidemia 08/13/2016  . Hx of colonic polyps 08/13/2016  . Benign neoplasm of sigmoid colon 08/13/2016  . Endometrial  cancer ([place]) 08/13/2016  . 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 Lymphedema 07/20/2016  . Chronic venous insufficiency 07/20/2016  . PAD (peripheral artery disease) ([place]) 07/20/2016  . Postmenopausal bleeding 07/14/2016  . Class 1 obesity due to excess calories without serious comorbidity with body mass index (BMI) of 34.0 to 34.9 in adult 05/27/2016  . Leg cramps 05/27/2016  . Bronchiectasis  ([place]) 12/19/2015  . Pre-syncope 12/19/2015  . Prolapsed, uterovaginal, incomplete 06/24/2014  . Routine general medical examination at a health care facility 12/30/2012  . Obesity (BMI 30-39.9) 12/30/2012  . Hx of multiple pulmonary nodules 12/28/2012  . Plantar fasciitis of right foot 12/28/2012  . Neuropathy associated with lymphoma ([place]) 09/12/2012  . Hip pain, bilateral 09/12/2012  . History of Hodgkin's lymphoma 09/12/2012  . Coronary artery disease   . Chest pain on exertion 02/29/2012    Past Surgical History:  Procedure Laterality Date  . ABDOMINAL HYSTERECTOMY    . bladder sling    . CARDIAC CATHETERIZATION  02/2012   [facility] 1 stent place  . CERVICAL POLYPECTOMY    . CHOLECYSTECTOMY  1982  . COLONOSCOPY WITH PROPOFOL N/A 02/05/2015   Procedure: COLONOSCOPY WITH PROPOFOL;  Surgeon: Lucilla Lame, MD;  Location: [facility] ENDOSCOPY;  Service: Endoscopy;  Laterality: N/A;  . CORONARY ANGIOPLASTY  02/2012   left/right s/p balloon  . CYSTOGRAM N/A 08/17/2016   Procedure: CYSTOGRAM;  Surgeon: Hollice Espy, MD;  Location: [facility] ORS;  Service: Urology;  Laterality: N/A;  . CYSTOSCOPY N/A 08/17/2016   Procedure: CYSTOSCOPY EXAM UNDER ANESTHESIA;  Surgeon: Hollice Espy, MD;  Location: [facility] ORS;  Service: Urology;  Laterality: N/A;  . CYSTOSCOPY W/ RETROGRADES Bilateral 08/17/2016   Procedure: CYSTOSCOPY WITH RETROGRADE PYELOGRAM;  Surgeon: Hollice Espy, MD;  Location: [facility] ORS;  Service: Urology;  Laterality: Bilateral;  . CYSTOSCOPY WITH STENT PLACEMENT Right 08/17/2016   Procedure: CYSTOSCOPY WITH STENT PLACEMENT;  Surgeon: Hollice Espy, MD;  Location: [facility] ORS;  Service: Urology;  Laterality: Right;  . heart stent'  2013  . kidney stent Right 2018  . LYMPH NODE BIOPSY  2011   diagnosis of hodgkins lymphoma  . PELVIC LYMPH NODE DISSECTION N/A 07/29/2016   Procedure: PELVIC/AORTIC LYMPH NODE SAMPLING;  Surgeon: [name], MD;  Location: [facility] ORS;  Service: Gynecology;   Laterality: N/A;  . PORTA CATH INSERTION N/A 09/22/2016   Procedure: Glori Luis Cath Insertion;  Surgeon: [name], MD;  Location: [place] [facility];  Service: Cardiovascular;  Laterality: N/A;  . PORTA CATH INSERTION N/A 10/27/2019   Procedure: PORTA CATH INSERTION;  Surgeon: [name], MD;  Location: [place] [facility];  Service: Cardiovascular;  Laterality: N/A;  . PORTA CATH REMOVAL N/A 11/17/2016   Procedure: Glori Luis Cath Removal;  Surgeon: [name], MD;  Location: [place] [facility];  Service: Cardiovascular;  Laterality: N/A;  . RIGHT/LEFT HEART CATH AND CORONARY ANGIOGRAPHY N/A 12/04/2019   Procedure: RIGHT/LEFT HEART CATH AND CORONARY ANGIOGRAPHY;  Surgeon: Wellington Hampshire, MD;  Location: [place] [facility];  Service: Cardiovascular;  Laterality: N/A;  . ROBOTIC ASSISTED TOTAL HYSTERECTOMY WITH BILATERAL SALPINGO OOPHERECTOMY N/A 07/29/2016   Procedure: ROBOTIC ASSISTED TOTAL HYSTERECTOMY WITH BILATERAL SALPINGO OOPHORECTOMY;  Surgeon: [name], MD;  Location: [facility] ORS;  Service: Gynecology;  Laterality: N/A;  . SENTINEL NODE BIOPSY N/A 07/29/2016   Procedure: SENTINEL NODE BIOPSY;  Surgeon: [name], MD;  Location: [facility] ORS;  Service: Gynecology;  Laterality: N/A;  .  transobturator sling N/A 2009   [place]    Prior to Admission medications   Medication Sig Start Date End Date Taking? Authorizing Provider  aspirin EC 81 MG tablet Take 81 mg by mouth at bedtime.   Yes [provider]  atorvastatin (LIPITOR) 10 MG tablet Take 1 tablet by mouth once daily Patient taking differently: Take 10 mg by mouth every evening.  10/23/19  Yes Wellington Hampshire, MD  cholecalciferol (VITAMIN D3) 25 MCG (1000 UNIT) tablet Take 1,000 Units by mouth in the morning and at bedtime.   Yes [provider]  cyanocobalamin (,VITAMIN B-12,) 1000 MCG/ML injection INJECT 1ML IM WEEKLY FOR 4 WEEKS, THEN INJECT MONTHLY THEREAFTER Patient  taking differently: Inject 1,000 mcg into the skin every 30 (thirty) days.  09/05/19  Yes [name], MD  DULoxetine (CYMBALTA) 30 MG capsule Take 1 capsule by mouth once daily Patient taking differently: Take 30 mg by mouth daily.  10/22/19  Yes Sindy Guadeloupe, MD  furosemide (LASIX) 20 MG tablet Take 1 tablet (20 mg total) by mouth as needed for fluid or edema (Take one tablet daily as needed for lower extremity edema or for weight gain of 2 lbs overnight or 5 lbs in one week.). 12/20/19  Yes Loel Dubonnet, NP  lidocaine-prilocaine (EMLA) cream Apply to affected area once Patient taking differently: Apply 1 application topically daily as needed (Apply to affected area during port access).  10/25/19  Yes Sindy Guadeloupe, MD  LORazepam (ATIVAN) 0.5 MG tablet Take 1 tablet (0.5 mg total) by mouth every 6 (six) hours as needed (Nausea or vomiting). Patient taking differently: Take 0.5 mg by mouth daily as needed (Nausea or vomiting).  10/25/19  Yes Sindy Guadeloupe, MD  metoprolol tartrate (LOPRESSOR) 25 MG tablet Take 1 tablet by mouth twice daily Patient taking differently: Take 25 mg by mouth 2 (two) times daily.  08/04/19  Yes Wellington Hampshire, MD  ondansetron (ZOFRAN) 8 MG tablet Take 1 tablet (8 mg total) by mouth 2 (two) times daily as needed (Nausea or vomiting). Patient taking differently: Take 8 mg by mouth 2 (two) times daily as needed for nausea or vomiting.  10/25/19  Yes Sindy Guadeloupe, MD  oxyCODONE (OXY IR/ROXICODONE) 5 MG immediate release tablet Take 1 tablet (5 mg total) by mouth every 6 (six) hours as needed for severe pain. 12/28/19  Yes Sindy Guadeloupe, MD  pantoprazole (PROTONIX) 20 MG tablet Take 1 tablet (20 mg total) by mouth daily. 12/20/19  Yes Jacquelin Hawking, NP  prochlorperazine (COMPAZINE) 10 MG tablet Take 1 tablet (10 mg total) by mouth every 6 (six) hours as needed (Nausea or vomiting). Patient taking differently: Take 10 mg by mouth daily as needed for nausea or vomiting.   10/25/19  Yes Sindy Guadeloupe, MD    Allergies Adhesive [tape], Z-pak [azithromycin], Antifungal [miconazole nitrate], and Sulfa antibiotics  Family History  Problem Relation Age of Onset  . ALS Father   . Polymyositis Father   . Diabetes Brother   . Cancer Maternal Aunt        breast  . Breast cancer Maternal Aunt        30's  . Stroke Maternal Grandmother   . Cancer Maternal Grandfather        prostate  . Stroke Maternal Grandfather   . Colon cancer Maternal Aunt   . Non-Hodgkin's lymphoma Cousin     Social History Social History   Tobacco Use  .  Smoking status: Former Smoker    Packs/day: 1.00    Years: 30.00    Pack years: 30.00    Types: Cigarettes    Quit date: 07/24/2002    Years since quitting: 17.4  . Smokeless tobacco: Never Used  . Tobacco comment: quit smoking in 2000  Vaping Use  . Vaping Use: Never used  Substance Use Topics  . Alcohol use: Not Currently  . Drug use: No    Review of Systems  Review of Systems  Constitutional: Positive for fatigue. Negative for fever.  HENT: Negative for congestion and sore throat.   Eyes: Negative for visual disturbance.  Respiratory: Positive for cough and shortness of breath.   Cardiovascular: Positive for chest pain.  Gastrointestinal: Negative for abdominal pain, diarrhea, nausea and vomiting.  Genitourinary: Negative for flank pain.  Musculoskeletal: Negative for back pain and neck pain.  Skin: Negative for rash and wound.  Neurological: Negative for weakness.  All other systems reviewed and are negative.    ____________________________________________  PHYSICAL EXAM:      VITAL SIGNS: ED Triage Vitals  Enc Vitals Group     BP 12/29/19 0643 115/62     Pulse Rate 12/29/19 0624 94     Resp 12/29/19 0624 (!) 21     Temp --      Temp src --      SpO2 12/29/19 0624 98 %     Weight --      Height --      Head Circumference --      Peak Flow --      Pain Score --      Pain Loc --      Pain Edu? --       Excl. in [place]? --      Physical Exam Vitals and nursing note reviewed.  Constitutional:      General: She is not in acute distress.    Appearance: She is well-developed.  HENT:     Head: Normocephalic and atraumatic.  Eyes:     Conjunctiva/sclera: Conjunctivae normal.  Cardiovascular:     Rate and Rhythm: Normal rate and regular rhythm.     Heart sounds: Normal heart sounds. No murmur heard.  No friction rub.  Pulmonary:     Effort: Pulmonary effort is normal. No respiratory distress.     Breath sounds: Rales present. No wheezing.  Abdominal:     General: There is no distension.     Palpations: Abdomen is soft.     Tenderness: There is no abdominal tenderness.  Musculoskeletal:     Cervical back: Neck supple.  Skin:    General: Skin is warm.     Capillary Refill: Capillary refill takes less than 2 seconds.  Neurological:     Mental Status: She is alert and oriented to person, place, and time.     Motor: No abnormal muscle tone.       ____________________________________________   LABS (all labs ordered are listed, but only abnormal results are displayed)  Labs Reviewed  BASIC METABOLIC PANEL - Abnormal; Notable for the following components:      Result Value   Glucose, Bld 103 (*)    Creatinine, Ser 1.26 (*)    Calcium 8.5 (*)    GFR calc non Af Amer 43 (*)    GFR calc Af Amer 50 (*)    All other components within normal limits  CBC - Abnormal; Notable for the following components:   WBC 17.4 (*)  RBC 2.89 (*)    Hemoglobin 8.7 (*)    HCT 27.1 (*)    RDW 19.9 (*)    All other components within normal limits  BRAIN NATRIURETIC PEPTIDE - Abnormal; Notable for the following components:   B Natriuretic Peptide 898.1 (*)    All other components within normal limits  TROPONIN I (HIGH SENSITIVITY) - Abnormal; Notable for the following components:   Troponin I (High Sensitivity) 31 (*)    All other components within normal limits  TROPONIN I (HIGH  SENSITIVITY) - Abnormal; Notable for the following components:   Troponin I (High Sensitivity) 250 (*)    All other components within normal limits  TROPONIN I (HIGH SENSITIVITY) - Abnormal; Notable for the following components:   Troponin I (High Sensitivity) 593 (*)    All other components within normal limits  TROPONIN I (HIGH SENSITIVITY) - Abnormal; Notable for the following components:   Troponin I (High Sensitivity) 1,211 (*)    All other components within normal limits  TROPONIN I (HIGH SENSITIVITY) - Abnormal; Notable for the following components:   Troponin I (High Sensitivity) 1,993 (*)    All other components within normal limits  SARS CORONAVIRUS 2 BY RT PCR (HOSPITAL ORDER, [place] [facility])  CULTURE, BLOOD (ROUTINE X 2)  CULTURE, BLOOD (ROUTINE X 2)  EXPECTORATED SPUTUM ASSESSMENT W REFEX TO RESP CULTURE  HIV ANTIBODY (ROUTINE TESTING W REFLEX)  CBC  BASIC METABOLIC PANEL  MAGNESIUM  HEMOGLOBIN A1C  LIPID PANEL  TYPE AND SCREEN    ____________________________________________  EKG: Normal sinus rhythm, VR 90. QRS 126, QTc 486. IVCD vs atypical LBBB. No ST elevations or depressions. ________________________________________  RADIOLOGY All imaging, including plain films, CT scans, and ultrasounds, independently reviewed by me, and interpretations confirmed via formal radiology reads.  ED MD interpretation:   CXR: No focal findings CT Angio PE: No PE, pneumonitis, infectious vs chemo related  Official radiology report(s): CT Angio Chest PE W and/or Wo Contrast  Result Date: 12/29/2019 CLINICAL DATA:  Shortness of breath EXAM: CT ANGIOGRAPHY CHEST WITH CONTRAST TECHNIQUE: Multidetector CT imaging of the chest was performed using the standard protocol during bolus administration of intravenous contrast. Multiplanar CT image reconstructions and MIPs were obtained to evaluate the vascular anatomy. CONTRAST:  46mL OMNIPAQUE IOHEXOL 350 MG/ML SOLN  COMPARISON:  Head CT 10/18/2019 FINDINGS: Cardiovascular: Normal heart size. No pericardial effusion. Coronary stenting. No pulmonary artery filling defect. Atherosclerotic calcification of the aorta. Mediastinum/Nodes: Paramediastinal fibrosis. There is history of Hodgkin's lymphoma. No enlarged lymph nodes currently. Lungs/Pleura: Perihilar radiation fibrosis. Somewhat centrilobular faint ground-glass density in the lower lungs not seen on comparison PET-CT. Small nodule at the right minor fissure, stable. No consolidation, effusion, or pneumothorax. Tiny fatty Bochdalek's hernia on the left Upper Abdomen: Cholecystectomy. Musculoskeletal: No acute or aggressive finding Review of the MIP images confirms the above findings. IMPRESSION: Mild ground-glass opacity in the lower lungs which is nonspecific. Pneumonitis from atypical infection or patient's chemotherapy are considerations. Negative for pulmonary embolism. Electronically Signed   By: Monte Fantasia M.D.   On: 12/29/2019 08:39   DG Chest Portable 1 View  Result Date: 12/29/2019 CLINICAL DATA:  69 year old female with history of chest pain. EXAM: PORTABLE CHEST 1 VIEW COMPARISON:  Chest x-ray 08/05/2016. FINDINGS: There is a right-sided internal jugular central venous catheter with tip terminating in the distal superior vena cava. Lung volumes are normal. Paramediastinal radiation fibrosis again noted. No consolidative airspace disease. No pleural effusions. No  pneumothorax. No pulmonary nodule or mass noted. Pulmonary vasculature and the cardiomediastinal silhouette are within normal limits. Atherosclerosis in the thoracic aorta. IMPRESSION: 1. Support apparatus, as above. 2. No radiographic evidence of acute cardiopulmonary disease. 3. Postradiation changes in the chest, similar to the prior study. 4. Aortic atherosclerosis. Electronically Signed   By: Vinnie Langton M.D.   On: 12/29/2019 06:51   ECHOCARDIOGRAM COMPLETE  Result Date: 12/29/2019     ECHOCARDIOGRAM REPORT   [name] Date of Exam: 12/29/2019 Medical Rec #:  [id number]         Height:       61.0 in Accession #:    [id number]        Weight:       162.0 lb Date of Birth:  1950-11-17         BSA:          1.727 m Patient Age:    59 years          BP:           97/52 mmHg Patient Gender: F                 HR:           80 bpm. Exam Location:  [facility] Procedure: 2D Echo, Cardiac Doppler, Color Doppler and Intracardiac            Opacification Agent Indications:     CHF- acute systolic 209.47  History:         Patient has prior history of Echocardiogram examinations, most                  recent 11/02/2019. Signs/Symptoms:Murmur; Risk                  Factors:Hypertension.  Sonographer:     Sherrie Sport RDCS (AE) Referring Phys:  [place] Diagnosing Phys: Ida Rogue MD IMPRESSIONS  1. Left ventricular ejection fraction, by estimation, is 30 to 35%. The left ventricle has moderately decreased function. The left ventricle demonstrates global hypokinesis. Left ventricular diastolic parameters are consistent with Grade I diastolic dysfunction (impaired relaxation).  2. Right ventricular systolic function is normal. The right ventricular size is normal. There is mildly elevated pulmonary artery systolic pressure. The estimated right ventricular systolic pressure is 09.6 mmHg.  3. The mitral valve is normal in structure. Mild mitral valve regurgitation.  4. The aortic valve is normal in structure. Aortic valve regurgitation is mild. FINDINGS  Left Ventricle: Left ventricular ejection fraction, by estimation, is 30 to 35%. The left ventricle has moderately decreased function. The left ventricle demonstrates global hypokinesis. Definity contrast agent was given IV to delineate the left ventricular endocardial borders. The left ventricular internal cavity size was normal in size. There is no left ventricular hypertrophy. Left ventricular diastolic parameters are consistent with  Grade I diastolic dysfunction (impaired relaxation). Right Ventricle: The right ventricular size is normal. No increase in right ventricular wall thickness. Right ventricular systolic function is normal. There is mildly elevated pulmonary artery systolic pressure. The tricuspid regurgitant velocity is 2.82  m/s, and with an assumed right atrial pressure of 5 mmHg, the estimated right ventricular systolic pressure is 28.3 mmHg. Left Atrium: Left atrial size was normal in size. Right Atrium: Right atrial size was normal in size. Pericardium: There is no evidence of pericardial effusion. Mitral Valve: The mitral valve is normal in structure. Normal mobility of the mitral valve leaflets.  Mild mitral valve regurgitation. No evidence of mitral valve stenosis. Tricuspid Valve: The tricuspid valve is normal in structure. Tricuspid valve regurgitation is not demonstrated. No evidence of tricuspid stenosis. Aortic Valve: The aortic valve is normal in structure. Aortic valve regurgitation is mild. Aortic regurgitation PHT measures 505 msec. No aortic stenosis is present. Aortic valve mean gradient measures 6.0 mmHg. Aortic valve peak gradient measures 12.8 mmHg. Aortic valve area, by VTI measures 2.30 cm. Pulmonic Valve: The pulmonic valve was normal in structure. Pulmonic valve regurgitation is not visualized. No evidence of pulmonic stenosis. Aorta: The aortic root is normal in size and structure. Venous: The inferior vena cava is normal in size with greater than 50% respiratory variability, suggesting right atrial pressure of 3 mmHg. IAS/Shunts: No atrial level shunt detected by color flow Doppler.  LEFT VENTRICLE PLAX 2D LVIDd:         5.48 cm      Diastology LVIDs:         4.59 cm      LV e' lateral:   3.59 cm/s LV PW:         0.82 cm      LV E/e' lateral: 22.8 LV IVS:        0.76 cm      LV e' medial:    5.44 cm/s LVOT diam:     2.00 cm      LV E/e' medial:  15.1 LV SV:         66 LV SV Index:   38 LVOT Area:     3.14  cm  LV Volumes (MOD) LV vol d, MOD A2C: 111.0 ml LV vol d, MOD A4C: 129.0 ml LV vol s, MOD A2C: 55.8 ml LV vol s, MOD A4C: 71.6 ml LV SV MOD A2C:     55.2 ml LV SV MOD A4C:     129.0 ml LV SV MOD BP:      56.5 ml RIGHT VENTRICLE RV Basal diam:  2.09 cm RV S prime:     13.60 cm/s TAPSE (M-mode): 2.7 cm LEFT ATRIUM             Index       RIGHT ATRIUM          Index LA diam:        3.70 cm 2.14 cm/m  RA Area:     9.33 cm LA Vol (A2C):   65.4 ml 37.87 ml/m RA Volume:   14.70 ml 8.51 ml/m LA Vol (A4C):   53.5 ml 30.98 ml/m LA Biplane Vol: 64.0 ml 37.06 ml/m  AORTIC VALVE                    PULMONIC VALVE AV Area (Vmax):    1.66 cm     PV Vmax:        0.79 m/s AV Area (Vmean):   1.96 cm     PV Peak grad:   2.5 mmHg AV Area (VTI):     2.30 cm     RVOT Peak grad: 4 mmHg AV Vmax:           179.00 cm/s AV Vmean:          104.000 cm/s AV VTI:            0.287 m AV Peak Grad:      12.8 mmHg AV Mean Grad:      6.0 mmHg LVOT Vmax:  94.60 cm/s LVOT Vmean:        64.900 cm/s LVOT VTI:          0.210 m LVOT/AV VTI ratio: 0.73 AI PHT:            505 msec  AORTA Ao Root diam: 2.30 cm MITRAL VALVE                TRICUSPID VALVE MV Area (PHT): 4.49 cm     TR Peak grad:   31.8 mmHg MV Decel Time: 169 msec     TR Vmax:        282.00 cm/s MV E velocity: 82.00 cm/s MV A velocity: 104.00 cm/s  SHUNTS MV E/A ratio:  0.79         Systemic VTI:  0.21 m                             Systemic Diam: 2.00 cm Ida Rogue MD Electronically signed by Ida Rogue MD Signature Date/Time: 12/29/2019/6:51:10 PM    Final     ____________________________________________  PROCEDURES   Procedure(s) performed (including Critical Care):  .1-3 Lead EKG Interpretation Performed by: Duffy Bruce, MD Authorized by: Duffy Bruce, MD     Interpretation: normal     ECG rate:  80-90   ECG rate assessment: normal     Rhythm: sinus rhythm     Conduction: normal   Comments:     Indication: Chest pain,  SOB    ____________________________________________  INITIAL IMPRESSION / MDM / ASSESSMENT AND PLAN / ED COURSE  As part of my medical decision making, I reviewed the following data within the [place] notes reviewed and incorporated, Old chart reviewed, Notes from prior ED visits, and [place] Controlled Substance Database       *GABY HARNEY was evaluated in Emergency Department on 12/29/2019 for the symptoms described in the history of present illness. She was evaluated in the context of the global COVID-19 pandemic, which necessitated consideration that the patient might be at risk for infection with the SARS-CoV-2 virus that causes COVID-19. Institutional protocols and algorithms that pertain to the evaluation of patients at risk for COVID-19 are in a state of rapid change based on information released by regulatory bodies including the CDC and federal and state organizations. These policies and algorithms were followed during the patient's care in the ED.  Some ED evaluations and interventions may be delayed as a result of limited staffing during the pandemic.*     Medical Decision Making:  69 yo F here with mild CP (now resolved), SOB. Initial labs overall reassuring with exception of BNP elevation. EKG nonischemic. Given her chemo/cancer history and pleuritic nature of pain, CT obtaeind and is largely unremarkable. ? Pneumonitis noted, no large focal findings, fever, or signs to suggest PNA. No PE.   Of note, trop elevated. No CP currently, EKG w/o ischemic changes. Consider CHF, less likely NSTEMI. ASA given, will admit. Dr. Rockey Situ consulted. Holding heparin in absence of CP or ischemic EKG changes.  ____________________________________________  FINAL CLINICAL IMPRESSION(S) / ED DIAGNOSES  Final diagnoses:  Other chest pain  Elevated troponin  Acute systolic congestive heart failure (HCC)     MEDICATIONS GIVEN DURING THIS VISIT:  Medications   nitroGLYCERIN (NITROSTAT) SL tablet 0.4 mg (has no administration in time range)  morphine 2 MG/ML injection 1 mg (has no administration in time range)  ondansetron (ZOFRAN) injection 4  mg (has no administration in time range)  hydrALAZINE (APRESOLINE) injection 5 mg (has no administration in time range)  acetaminophen (TYLENOL) tablet 650 mg (has no administration in time range)  albuterol (PROVENTIL) (2.5 MG/3ML) 0.083% nebulizer solution 2.5 mg (has no administration in time range)  dextromethorphan-guaiFENesin (MUCINEX DM) 30-600 MG per 12 hr tablet 1 tablet (has no administration in time range)  aspirin EC tablet 81 mg (has no administration in time range)  oxyCODONE (Oxy IR/ROXICODONE) immediate release tablet 5 mg (5 mg Oral Given 12/29/19 1901)  atorvastatin (LIPITOR) tablet 10 mg (10 mg Oral Given 12/29/19 1901)  DULoxetine (CYMBALTA) DR capsule 30 mg (has no administration in time range)  LORazepam (ATIVAN) tablet 0.5 mg (has no administration in time range)  pantoprazole (PROTONIX) EC tablet 20 mg (has no administration in time range)  cholecalciferol (VITAMIN D3) tablet 1,000 Units (has no administration in time range)  lidocaine-prilocaine (EMLA) cream 1 application (has no administration in time range)  doxycycline (VIBRA-TABS) tablet 100 mg (100 mg Oral Given 12/29/19 1408)  furosemide (LASIX) injection 20 mg (has no administration in time range)  iohexol (OMNIPAQUE) 350 MG/ML injection 60 mL (60 mLs Intravenous Contrast Given 12/29/19 0802)  aspirin chewable tablet 324 mg (324 mg Oral Given 12/29/19 1131)  furosemide (LASIX) injection 20 mg (20 mg Intravenous Given 12/29/19 1130)     ED Discharge Orders    None       Note:  This document was prepared using Dragon voice recognition software and may include unintentional dictation errors.   Duffy Bruce, MD 12/29/19 2025

## 2019-12-29 NOTE — ED Notes (Signed)
Dr. Ellender Hose informed of critical trop

## 2019-12-29 NOTE — ED Notes (Signed)
Pt ambulated in room with steady gait, O2 96-98% on RA during ambulation

## 2019-12-30 DIAGNOSIS — I5043 Acute on chronic combined systolic (congestive) and diastolic (congestive) heart failure: Secondary | ICD-10-CM

## 2019-12-30 DIAGNOSIS — I214 Non-ST elevation (NSTEMI) myocardial infarction: Secondary | ICD-10-CM

## 2019-12-30 DIAGNOSIS — R079 Chest pain, unspecified: Secondary | ICD-10-CM | POA: Diagnosis not present

## 2019-12-30 DIAGNOSIS — R778 Other specified abnormalities of plasma proteins: Secondary | ICD-10-CM | POA: Diagnosis not present

## 2019-12-30 LAB — CBC
HCT: 23 % — ABNORMAL LOW (ref 36.0–46.0)
Hemoglobin: 7.3 g/dL — ABNORMAL LOW (ref 12.0–15.0)
MCH: 29.7 pg (ref 26.0–34.0)
MCHC: 31.7 g/dL (ref 30.0–36.0)
MCV: 93.5 fL (ref 80.0–100.0)
Platelets: 181 10*3/uL (ref 150–400)
RBC: 2.46 MIL/uL — ABNORMAL LOW (ref 3.87–5.11)
RDW: 20.1 % — ABNORMAL HIGH (ref 11.5–15.5)
WBC: 4.2 10*3/uL (ref 4.0–10.5)
nRBC: 0 % (ref 0.0–0.2)

## 2019-12-30 LAB — LIPID PANEL
Cholesterol: 114 mg/dL (ref 0–200)
HDL: 47 mg/dL (ref >40)
LDL Cholesterol: 41 mg/dL (ref 0–99)
Total CHOL/HDL Ratio: 2.4 RATIO
Triglycerides: 131 mg/dL (ref <150)
VLDL: 26 mg/dL (ref 0–40)

## 2019-12-30 LAB — HEMOGLOBIN A1C
Hgb A1c MFr Bld: 6.1 % — ABNORMAL HIGH (ref 4.8–5.6)
Mean Plasma Glucose: 128.37 mg/dL

## 2019-12-30 LAB — BASIC METABOLIC PANEL
Anion gap: 10 (ref 5–15)
BUN: 23 mg/dL (ref 8–23)
CO2: 28 mmol/L (ref 22–32)
Calcium: 8.4 mg/dL — ABNORMAL LOW (ref 8.9–10.3)
Chloride: 102 mmol/L (ref 98–111)
Creatinine, Ser: 1.26 mg/dL — ABNORMAL HIGH (ref 0.44–1.00)
GFR calc Af Amer: 50 mL/min — ABNORMAL LOW (ref >60)
GFR calc non Af Amer: 43 mL/min — ABNORMAL LOW (ref >60)
Glucose, Bld: 98 mg/dL (ref 70–99)
Potassium: 3.7 mmol/L (ref 3.5–5.1)
Sodium: 140 mmol/L (ref 135–145)

## 2019-12-30 LAB — GLUCOSE, CAPILLARY: Glucose-Capillary: 83 mg/dL (ref 70–99)

## 2019-12-30 LAB — HIV ANTIBODY (ROUTINE TESTING W REFLEX): HIV Screen 4th Generation wRfx: NONREACTIVE

## 2019-12-30 LAB — TROPONIN I (HIGH SENSITIVITY): Troponin I (High Sensitivity): 1130 ng/L (ref <18)

## 2019-12-30 LAB — MAGNESIUM: Magnesium: 1.7 mg/dL (ref 1.7–2.4)

## 2019-12-30 LAB — HEMOGLOBIN AND HEMATOCRIT, BLOOD
HCT: 24 % — ABNORMAL LOW (ref 36.0–46.0)
Hemoglobin: 7.8 g/dL — ABNORMAL LOW (ref 12.0–15.0)

## 2019-12-30 NOTE — Progress Notes (Signed)
PT Cancellation Note  Patient Details Name: Jaime Hall MRN: [id number] DOB: 07/03/1950   Cancelled Treatment:    Reason Eval/Treat Not Completed: PT screened, no needs identified, will sign off;Other (comment) (Per OT, pt is independent with ADLs and mobility at baseline, and currently functioning at baseline level.)   Madilyn Hook 12/30/2019, 12:38 PM

## 2019-12-30 NOTE — Hospital Course (Signed)
Jaime Hall is a 69 y.o. female with medical history significant of hypertension, hyperlipidemia, GERD, depression, Hodgkin lymphoma treated with chemo 10 years ago, endometrial cancer currently on chemotherapy (last dose was on 12/28/19), CAD, DES stent placement, PVD, CKD stage III, CHF with EF 30-35%, bronchiectasis, who presented to the ED on 12/29/19 with chest pain that woke her up from sleep, SOB, cough with small amount of dark hemoptysis.  Chest pain resolved by time of admission.  She had recently been taking her Lasix past 3 days due to leg swelling.  Reports diagnosis of CHF secondary to chemotherapy.     In the ED, afebrile, tachypneic otherwise stable vitals.  Troponin initially 31 >> 250.  Leukocytosis 17.4k.  Renal function stable.  Chest xray negative.  CTA chest was negative for PE, showed mild ground-glass opacities of the lower lungs consistent with pneumonitis due to chemo vs atypical infection.  Admitted to hospitalist service.  Cardiology consulted.  Troponin trended and continued to rise 31 >>250 >>1211 >> 1993 >>1130.  EKG without ischemic changes.  Patient had left heart cath last month that showed patent stent, non-obstruction coronary disease.  Heparin anticoagulation deferred due to worsening anemia and hemoptysis, and patient's chest pain has resolved.

## 2019-12-30 NOTE — Discharge Summary (Signed)
Physician Discharge Summary  Jaime Hall JIR:[id number] DOB: 05-21-1951 DOA: 12/29/2019  PCP: [name], MD  Admit date: 12/29/2019 Discharge date: 12/30/2019  Admitted From: home Disposition:  home  Recommendations for Outpatient Follow-up:  1. Follow up with PCP in 1-2 weeks 2. Please obtain BMP/CBC in one week 3. Please follow up with oncology as previously scheduled 4. Please follow up with cardiology  Home Health: No Equipment/Devices: None   Discharge Condition: Stable  CODE STATUS: Full  Diet recommendation: Heart Healthy    Discharge Diagnoses: Principal Problem:   Chest pain Active Problems:   Coronary artery disease   Endometrial cancer (HCC)   Acute on chronic combined systolic and diastolic CHF (congestive heart failure) (HCC)   Leukocytosis   HTN (hypertension)   HLD (hyperlipidemia)   Elevated troponin   CKD (chronic kidney disease), stage IIIa   Depression with anxiety    Summary of HPI and Hospital Course:  Jaime Hall is a 69 y.o. female with medical history significant of hypertension, hyperlipidemia, GERD, depression, Hodgkin lymphoma treated with chemo 10 years ago, endometrial cancer currently on chemotherapy (last dose was on 12/28/19), CAD, DES stent placement, PVD, CKD stage III, CHF with EF 30-35%, bronchiectasis, who presented to the ED on 12/29/19 with chest pain that woke her up from sleep, SOB, cough with small amount of dark hemoptysis.  Chest pain resolved by time of admission.  She had recently been taking her Lasix past 3 days due to leg swelling.  Reports diagnosis of CHF secondary to chemotherapy.     In the ED, afebrile, tachypneic otherwise stable vitals.  Troponin initially 31 >> 250.  Leukocytosis 17.4k.  Renal function stable.  Chest xray negative.  CTA chest was negative for PE, showed mild ground-glass opacities of the lower lungs consistent with pneumonitis due to chemo vs atypical infection.  Admitted to hospitalist  service.  Cardiology consulted.  Troponin trended and continued to rise 31 >>250 >>1211 >> 1993 >>1130.  EKG without ischemic changes.  Patient had left heart cath last month that showed patent stent, non-obstruction coronary disease.  Heparin anticoagulation deferred due to worsening anemia and hemoptysis, and patient's chest pain has resolved.    Cardiology was consulted.  Chest pain thought to be multifactorial secondary to her comorbidities including pulmonary HTN, systolic CHF with low CO, valvular heart disease, and anemia.  Suspected troponin elevation due to demand ischemia.  Transient coronary vasospasm a possibility as well.  No further ischemic evaluation was recommended.  Echocardiogram with EF 30-35% and unchanged from prior.  Continued on medical therapy.  She remained chest pain free for the duration of admission, stable for discharge home    Discharge Instructions   Discharge Instructions    ([place]) Call MD:  Anytime you have any of the following symptoms: 1) 3 pound weight gain in 24 hours or 5 pounds in 1 week 2) shortness of breath, with or without a dry hacking cough 3) swelling in the hands, feet or stomach 4) if you have to sleep on extra pillows at night in order to breathe.   Complete by: As directed    Call MD for:  extreme fatigue   Complete by: As directed    Call MD for:  persistant dizziness or light-headedness   Complete by: As directed    Call MD for:  severe uncontrolled pain   Complete by: As directed    Call MD for:  temperature >100.4   Complete by:  As directed    Diet - low sodium heart healthy   Complete by: As directed    Increase activity slowly   Complete by: As directed      Allergies as of 12/30/2019      Reactions   Adhesive [tape] Other (See Comments)   Burns the skin   Z-pak [azithromycin] Itching   Antifungal [miconazole Nitrate] Rash   Sulfa Antibiotics Nausea And Vomiting, Rash   N/T      Medication List    TAKE  these medications   aspirin EC 81 MG tablet Take 81 mg by mouth at bedtime.   atorvastatin 10 MG tablet Commonly known as: LIPITOR Take 1 tablet by mouth once daily What changed: when to take this   cholecalciferol 25 MCG (1000 UNIT) tablet Commonly known as: VITAMIN D3 Take 1,000 Units by mouth in the morning and at bedtime.   cyanocobalamin 1000 MCG/ML injection Commonly known as: (VITAMIN B-12) INJECT 1ML IM WEEKLY FOR 4 WEEKS, THEN INJECT MONTHLY THEREAFTER What changed:   how much to take  how to take this  when to take this  additional instructions   DULoxetine 30 MG capsule Commonly known as: CYMBALTA Take 1 capsule by mouth once daily   furosemide 20 MG tablet Commonly known as: Lasix Take 1 tablet (20 mg total) by mouth as needed for fluid or edema (Take one tablet daily as needed for lower extremity edema or for weight gain of 2 lbs overnight or 5 lbs in one week.).   lidocaine-prilocaine cream Commonly known as: EMLA Apply to affected area once What changed:   how much to take  how to take this  when to take this  reasons to take this  additional instructions   LORazepam 0.5 MG tablet Commonly known as: Ativan Take 1 tablet (0.5 mg total) by mouth every 6 (six) hours as needed (Nausea or vomiting). What changed: when to take this   metoprolol tartrate 25 MG tablet Commonly known as: LOPRESSOR Take 1 tablet by mouth twice daily   ondansetron 8 MG tablet Commonly known as: Zofran Take 1 tablet (8 mg total) by mouth 2 (two) times daily as needed (Nausea or vomiting). What changed: reasons to take this   oxyCODONE 5 MG immediate release tablet Commonly known as: Oxy IR/ROXICODONE Take 1 tablet (5 mg total) by mouth every 6 (six) hours as needed for severe pain.   pantoprazole 20 MG tablet Commonly known as: Protonix Take 1 tablet (20 mg total) by mouth daily.   prochlorperazine 10 MG tablet Commonly known as: COMPAZINE Take 1 tablet (10  mg total) by mouth every 6 (six) hours as needed (Nausea or vomiting). What changed:   when to take this  reasons to take this       [facility] Follow up on 02/01/2020.   Specialty: Cardiology Why: at [facility] through the [facility] Contact information: [place] [address]             Allergies  Allergen Reactions  . Adhesive [Tape] Other (See Comments)    Burns the skin  . Z-Pak [Azithromycin] Itching  . Antifungal [Miconazole Nitrate] Rash  . Sulfa Antibiotics Nausea And Vomiting and Rash    N/T    Consultations:  Cardiology    Procedures/Studies: CT Angio Chest PE W and/or Wo Contrast  Result Date: 12/29/2019 CLINICAL DATA:  Shortness of breath EXAM: CT ANGIOGRAPHY CHEST WITH CONTRAST TECHNIQUE: Multidetector CT imaging of the chest was performed using the standard protocol during bolus administration of intravenous contrast. Multiplanar CT image reconstructions and MIPs were obtained to evaluate the vascular anatomy. CONTRAST:  59mL OMNIPAQUE IOHEXOL 350 MG/ML SOLN COMPARISON:  Head CT 10/18/2019 FINDINGS: Cardiovascular: Normal heart size. No pericardial effusion. Coronary stenting. No pulmonary artery filling defect. Atherosclerotic calcification of the aorta. Mediastinum/Nodes: Paramediastinal fibrosis. There is history of Hodgkin's lymphoma. No enlarged lymph nodes currently. Lungs/Pleura: Perihilar radiation fibrosis. Somewhat centrilobular faint ground-glass density in the lower lungs not seen on comparison PET-CT. Small nodule at the right minor fissure, stable. No consolidation, effusion, or pneumothorax. Tiny fatty Bochdalek's hernia on the left Upper Abdomen: Cholecystectomy. Musculoskeletal: No acute or aggressive finding Review of the MIP images confirms the above findings. IMPRESSION: Mild ground-glass opacity in the  lower lungs which is nonspecific. Pneumonitis from atypical infection or patient's chemotherapy are considerations. Negative for pulmonary embolism. Electronically Signed   By: Monte Fantasia M.D.   On: 12/29/2019 08:39   CARDIAC CATHETERIZATION  Result Date: 12/04/2019  There is moderate to severe left ventricular systolic dysfunction.  LV end diastolic pressure is moderately elevated.  Previously placed Prox LAD stent (unknown type) is widely patent.  Ost LAD to Prox LAD lesion is 40% stenosed.  1.  Patent LAD stent with no significant restenosis.  There is moderate stenosis in ostial LAD before the stent.  No evidence of obstructive disease overall. 2.  Moderately to severely reduced LV systolic function with an EF of 30 to 35%. 3.  Right heart catheterization showed mildly elevated filling pressures with pulmonary capillary wedge pressure of 18 mmHg, mild to moderate pulmonary hypertension at 44/19 with a mean of 33 mmHg, and mildly reduced cardiac output at 3.89 with a cardiac index of 2.24.  Pulmonary vascular resistance is 3.8 Woods units Recommendations: Continue medical therapy for coronary artery disease. 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  She is mildly volume overloaded and I added small dose furosemide. Recommend medical therapy for cardiomyopathy.   DG Chest Portable 1 View  Result Date: 12/29/2019 CLINICAL DATA:  69 year old female with history of chest pain. EXAM: PORTABLE CHEST 1 VIEW COMPARISON:  Chest x-ray 08/05/2016. FINDINGS: There is a right-sided internal jugular central venous catheter with tip terminating in the distal superior vena cava. Lung volumes are normal. Paramediastinal radiation fibrosis again noted. No consolidative airspace disease. No pleural effusions. No pneumothorax. No pulmonary nodule or mass noted. Pulmonary vasculature and the cardiomediastinal silhouette are within normal limits. Atherosclerosis in the  thoracic aorta. IMPRESSION: 1. Support apparatus, as above. 2. No radiographic evidence of acute cardiopulmonary disease. 3. Postradiation changes in the chest, similar to the prior study. 4. Aortic atherosclerosis. Electronically Signed   By: Vinnie Langton M.D.   On: 12/29/2019 06:51   ECHOCARDIOGRAM COMPLETE  Result Date: 12/29/2019    ECHOCARDIOGRAM REPORT   [name] Date of Exam: 12/29/2019 Medical Rec #:  [id number]         Height:       61.0 in Accession #:    [id number]        Weight:       162.0 lb Date of Birth:  Jul 01, 1950         BSA:          1.727 m Patient Age:    56 years  BP:           97/52 mmHg Patient Gender: F                 HR:           80 bpm. Exam Location:  [facility] Procedure: 2D Echo, Cardiac Doppler, Color Doppler and Intracardiac            Opacification Agent Indications:     CHF- acute systolic 768.11  History:         Patient has prior history of Echocardiogram examinations, most                  recent 11/02/2019. Signs/Symptoms:Murmur; Risk                  Factors:Hypertension.  Sonographer:     Sherrie Sport RDCS (AE) Referring Phys:  [place] Diagnosing Phys: Ida Rogue MD IMPRESSIONS  1. Left ventricular ejection fraction, by estimation, is 30 to 35%. The left ventricle has moderately decreased function. The left ventricle demonstrates global hypokinesis. Left ventricular diastolic parameters are consistent with Grade I diastolic dysfunction (impaired relaxation).  2. Right ventricular systolic function is normal. The right ventricular size is normal. There is mildly elevated pulmonary artery systolic pressure. The estimated right ventricular systolic pressure is 57.2 mmHg.  3. The mitral valve is normal in structure. Mild mitral valve regurgitation.  4. The aortic valve is normal in structure. Aortic valve regurgitation is mild. FINDINGS  Left Ventricle: Left ventricular ejection fraction, by estimation, is 30 to 35%. The left  ventricle has moderately decreased function. The left ventricle demonstrates global hypokinesis. Definity contrast agent was given IV to delineate the left ventricular endocardial borders. The left ventricular internal cavity size was normal in size. There is no left ventricular hypertrophy. Left ventricular diastolic parameters are consistent with Grade I diastolic dysfunction (impaired relaxation). Right Ventricle: The right ventricular size is normal. No increase in right ventricular wall thickness. Right ventricular systolic function is normal. There is mildly elevated pulmonary artery systolic pressure. The tricuspid regurgitant velocity is 2.82  m/s, and with an assumed right atrial pressure of 5 mmHg, the estimated right ventricular systolic pressure is 62.0 mmHg. Left Atrium: Left atrial size was normal in size. Right Atrium: Right atrial size was normal in size. Pericardium: There is no evidence of pericardial effusion. Mitral Valve: The mitral valve is normal in structure. Normal mobility of the mitral valve leaflets. Mild mitral valve regurgitation. No evidence of mitral valve stenosis. Tricuspid Valve: The tricuspid valve is normal in structure. Tricuspid valve regurgitation is not demonstrated. No evidence of tricuspid stenosis. Aortic Valve: The aortic valve is normal in structure. Aortic valve regurgitation is mild. Aortic regurgitation PHT measures 505 msec. No aortic stenosis is present. Aortic valve mean gradient measures 6.0 mmHg. Aortic valve peak gradient measures 12.8 mmHg. Aortic valve area, by VTI measures 2.30 cm. Pulmonic Valve: The pulmonic valve was normal in structure. Pulmonic valve regurgitation is not visualized. No evidence of pulmonic stenosis. Aorta: The aortic root is normal in size and structure. Venous: The inferior vena cava is normal in size with greater than 50% respiratory variability, suggesting right atrial pressure of 3 mmHg. IAS/Shunts: No atrial level shunt detected by  color flow Doppler.  LEFT VENTRICLE PLAX 2D LVIDd:         5.48 cm      Diastology LVIDs:         4.59 cm  LV e' lateral:   3.59 cm/s LV PW:         0.82 cm      LV E/e' lateral: 22.8 LV IVS:        0.76 cm      LV e' medial:    5.44 cm/s LVOT diam:     2.00 cm      LV E/e' medial:  15.1 LV SV:         66 LV SV Index:   38 LVOT Area:     3.14 cm  LV Volumes (MOD) LV vol d, MOD A2C: 111.0 ml LV vol d, MOD A4C: 129.0 ml LV vol s, MOD A2C: 55.8 ml LV vol s, MOD A4C: 71.6 ml LV SV MOD A2C:     55.2 ml LV SV MOD A4C:     129.0 ml LV SV MOD BP:      56.5 ml RIGHT VENTRICLE RV Basal diam:  2.09 cm RV S prime:     13.60 cm/s TAPSE (M-mode): 2.7 cm LEFT ATRIUM             Index       RIGHT ATRIUM          Index LA diam:        3.70 cm 2.14 cm/m  RA Area:     9.33 cm LA Vol (A2C):   65.4 ml 37.87 ml/m RA Volume:   14.70 ml 8.51 ml/m LA Vol (A4C):   53.5 ml 30.98 ml/m LA Biplane Vol: 64.0 ml 37.06 ml/m  AORTIC VALVE                    PULMONIC VALVE AV Area (Vmax):    1.66 cm     PV Vmax:        0.79 m/s AV Area (Vmean):   1.96 cm     PV Peak grad:   2.5 mmHg AV Area (VTI):     2.30 cm     RVOT Peak grad: 4 mmHg AV Vmax:           179.00 cm/s AV Vmean:          104.000 cm/s AV VTI:            0.287 m AV Peak Grad:      12.8 mmHg AV Mean Grad:      6.0 mmHg LVOT Vmax:         94.60 cm/s LVOT Vmean:        64.900 cm/s LVOT VTI:          0.210 m LVOT/AV VTI ratio: 0.73 AI PHT:            505 msec  AORTA Ao Root diam: 2.30 cm MITRAL VALVE                TRICUSPID VALVE MV Area (PHT): 4.49 cm     TR Peak grad:   31.8 mmHg MV Decel Time: 169 msec     TR Vmax:        282.00 cm/s MV E velocity: 82.00 cm/s MV A velocity: 104.00 cm/s  SHUNTS MV E/A ratio:  0.79         Systemic VTI:  0.21 m                             Systemic Diam: 2.00 cm Ida Rogue MD Electronically signed by Ida Rogue MD  Signature Date/Time: 12/29/2019/6:51:10 PM    Final         Subjective: Patient seen this AM.  Says her chest pain  resolved and has not returned.  Also no shortness of breath or palpitations.  Feels well.  No acute complaints.   Discharge Exam: Vitals:   12/30/19 1146 12/30/19 1224  BP: (!) 104/59 (!) 112/97  Pulse: 94 (!) 117  Resp: 16   Temp: 98.2 F (36.8 C)   SpO2: 97% 97%   Vitals:   12/30/19 0342 12/30/19 1019 12/30/19 1146 12/30/19 1224  BP: (!) 99/51 (!) 90/48 (!) 104/59 (!) 112/97  Pulse: 83 (!) 53 94 (!) 117  Resp:   16   Temp: 98.2 F (36.8 C) 98.4 F (36.9 C) 98.2 F (36.8 C)   TempSrc:  Oral Oral   SpO2: 98% 99% 97% 97%  Weight: 73 kg     Height:        General: Pt is alert, awake, not in acute distress Cardiovascular: RRR, S1/S2 +, no rubs, no gallops Respiratory: CTA bilaterally, no wheezing, no rhonchi Abdominal: Soft, NT, ND, bowel sounds + Extremities: no edema, no cyanosis    The results of significant diagnostics from this hospitalization (including imaging, microbiology, ancillary and laboratory) are listed below for reference.     Microbiology: Recent Results (from the past 240 hour(s))  SARS Coronavirus 2 by RT PCR (hospital order, performed in [facility] hospital lab) Nasopharyngeal Nasopharyngeal Swab     Status: None   Collection Time: 12/29/19 11:33 AM   Specimen: Nasopharyngeal Swab  Result Value Ref Range Status   SARS Coronavirus 2 NEGATIVE NEGATIVE Final    Comment: (NOTE) SARS-CoV-2 target nucleic acids are NOT DETECTED.  The SARS-CoV-2 RNA is generally detectable in upper and lower respiratory specimens during the acute phase of infection. The lowest concentration of SARS-CoV-2 viral copies this assay can detect is 250 copies / mL. A negative result does not preclude SARS-CoV-2 infection and should not be used as the sole basis for treatment or other patient management decisions.  A negative result may occur with improper specimen collection / handling, submission of specimen other than nasopharyngeal swab, presence of viral mutation(s)  within the areas targeted by this assay, and inadequate number of viral copies (<250 copies / mL). A negative result must be combined with clinical observations, patient history, and epidemiological information.  Fact Sheet for Patients:   StrictlyIdeas.no  Fact Sheet for Healthcare Providers: BankingDealers.co.za  This test is not yet approved or  cleared by the [place] FDA and has been authorized for detection and/or diagnosis of SARS-CoV-2 by FDA under an Emergency Use Authorization (EUA).  This EUA will remain in effect (meaning this test can be used) for the duration of the COVID-19 declaration under Section 564(b)(1) of the Act, 21 U.S.C. section 360bbb-3(b)(1), unless the authorization is terminated or revoked sooner.  Performed at [facility], [address] [place], [place] [address]   CULTURE, BLOOD (ROUTINE X 2) w Reflex to ID Panel     Status: None (Preliminary result)   Collection Time: 12/29/19 11:34 AM   Specimen: BLOOD LEFT ARM  Result Value Ref Range Status   Specimen Description BLOOD LEFT ARM  Final   Special Requests   Final    BOTTLES DRAWN AEROBIC AND ANAEROBIC Blood Culture adequate volume   Culture   Final    NO GROWTH < 24 HOURS Performed at [facility], [place],  [place] [address]    Report Status PENDING  Incomplete  CULTURE, BLOOD (ROUTINE X 2) w Reflex to ID Panel     Status: None (Preliminary result)   Collection Time: 12/29/19  2:07 PM   Specimen: BLOOD  Result Value Ref Range Status   Specimen Description BLOOD BLOOD RIGHT ARM  Final   Special Requests   Final    BOTTLES DRAWN AEROBIC AND ANAEROBIC Blood Culture adequate volume   Culture   Final    NO GROWTH < 24 HOURS Performed at [facility], [address] [place],  [address]    Report Status PENDING  Incomplete     Labs: BNP (last 3 results) Recent Labs    12/29/19 0625   BNP 193.7*   Basic Metabolic Panel: Recent Labs  Lab 12/28/19 0751 12/29/19 0625 12/30/19 0457  NA 141 140 140  K 3.9 4.4 3.7  CL 106 105 102  CO2 27 26 28   GLUCOSE 100* 103* 98  BUN 14 18 23   CREATININE 1.36* 1.26* 1.26*  CALCIUM 8.3* 8.5* 8.4*  MG  --   --  1.7   Liver Function Tests: Recent Labs  Lab 12/28/19 0751  AST 26  ALT 25  ALKPHOS 113  BILITOT 0.7  PROT 5.9*  ALBUMIN 3.3*   No results for input(s): LIPASE, AMYLASE in the last 168 hours. No results for input(s): AMMONIA in the last 168 hours. CBC: Recent Labs  Lab 12/28/19 0751 12/29/19 0625 12/30/19 0457 12/30/19 1336  WBC 9.4 17.4* 4.2  --   NEUTROABS 6.9  --   --   --   HGB 8.1* 8.7* 7.3* 7.8*  HCT 24.9* 27.1* 23.0* 24.0*  MCV 92.6 93.8 93.5  --   PLT 146* 218 181  --    Cardiac Enzymes: No results for input(s): CKTOTAL, CKMB, CKMBINDEX, TROPONINI in the last 168 hours. BNP: Invalid input(s): POCBNP CBG: Recent Labs  Lab 12/30/19 1224  GLUCAP 83   D-Dimer No results for input(s): DDIMER in the last 72 hours. Hgb A1c Recent Labs    12/30/19 0457  HGBA1C 6.1*   Lipid Profile Recent Labs    12/30/19 0457  CHOL 114  HDL 47  LDLCALC 41  TRIG 131  CHOLHDL 2.4   Thyroid function studies No results for input(s): TSH, T4TOTAL, T3FREE, THYROIDAB in the last 72 hours.  Invalid input(s): FREET3 Anemia work up No results for input(s): VITAMINB12, FOLATE, FERRITIN, TIBC, IRON, RETICCTPCT in the last 72 hours. Urinalysis    Component Value Date/Time   COLORURINE YELLOW (A) 08/05/2016 1950   APPEARANCEUR Clear 09/24/2016 0945   LABSPEC 1.018 08/05/2016 1950   PHURINE 6.0 08/05/2016 1950   GLUCOSEU Negative 09/24/2016 0945   GLUCOSEU NEGATIVE 07/07/2016 1418   HGBUR MODERATE (A) 08/05/2016 1950   BILIRUBINUR negative 10/02/2019 1627   BILIRUBINUR Negative 09/24/2016 0945   KETONESUR 5 (A) 08/05/2016 1950   PROTEINUR Negative 10/02/2019 1627   PROTEINUR Negative 09/24/2016 0945    PROTEINUR 100 (A) 08/05/2016 1950   UROBILINOGEN 0.2 10/02/2019 1627   UROBILINOGEN 0.2 07/07/2016 1418   NITRITE negatvie 10/02/2019 1627   NITRITE Negative 09/24/2016 0945   NITRITE NEGATIVE 08/05/2016 1950   LEUKOCYTESUR Negative 10/02/2019 1627   LEUKOCYTESUR Negative 09/24/2016 0945   Sepsis Labs Invalid input(s): PROCALCITONIN,  WBC,  LACTICIDVEN Microbiology Recent Results (from the past 240 hour(s))  SARS Coronavirus 2 by RT PCR (hospital order, performed in [facility] hospital lab) Nasopharyngeal Nasopharyngeal Swab  Status: None   Collection Time: 12/29/19 11:33 AM   Specimen: Nasopharyngeal Swab  Result Value Ref Range Status   SARS Coronavirus 2 NEGATIVE NEGATIVE Final    Comment: (NOTE) SARS-CoV-2 target nucleic acids are NOT DETECTED.  The SARS-CoV-2 RNA is generally detectable in upper and lower respiratory specimens during the acute phase of infection. The lowest concentration of SARS-CoV-2 viral copies this assay can detect is 250 copies / mL. A negative result does not preclude SARS-CoV-2 infection and should not be used as the sole basis for treatment or other patient management decisions.  A negative result may occur with improper specimen collection / handling, submission of specimen other than nasopharyngeal swab, presence of viral mutation(s) within the areas targeted by this assay, and inadequate number of viral copies (<250 copies / mL). A negative result must be combined with clinical observations, patient history, and epidemiological information.  Fact Sheet for Patients:   StrictlyIdeas.no  Fact Sheet for Healthcare Providers: BankingDealers.co.za  This test is not yet approved or  cleared by the [place] FDA and has been authorized for detection and/or diagnosis of SARS-CoV-2 by FDA under an Emergency Use Authorization (EUA).  This EUA will remain in effect (meaning this test can be used)  for the duration of the COVID-19 declaration under Section 564(b)(1) of the Act, 21 U.S.C. section 360bbb-3(b)(1), unless the authorization is terminated or revoked sooner.  Performed at [facility], [address] [place], [place] [address]   CULTURE, BLOOD (ROUTINE X 2) w Reflex to ID Panel     Status: None (Preliminary result)   Collection Time: 12/29/19 11:34 AM   Specimen: BLOOD LEFT ARM  Result Value Ref Range Status   Specimen Description BLOOD LEFT ARM  Final   Special Requests   Final    BOTTLES DRAWN AEROBIC AND ANAEROBIC Blood Culture adequate volume   Culture   Final    NO GROWTH < 24 HOURS Performed at [facility], [address], [place], [place] [address]    Report Status PENDING  Incomplete  CULTURE, BLOOD (ROUTINE X 2) w Reflex to ID Panel     Status: None (Preliminary result)   Collection Time: 12/29/19  2:07 PM   Specimen: BLOOD  Result Value Ref Range Status   Specimen Description BLOOD BLOOD RIGHT ARM  Final   Special Requests   Final    BOTTLES DRAWN AEROBIC AND ANAEROBIC Blood Culture adequate volume   Culture   Final    NO GROWTH < 24 HOURS Performed at [facility], [address], [place], [place] [address]    Report Status PENDING  Incomplete     Time coordinating discharge: Over 30 minutes  SIGNED:   Ezekiel Slocumb, DO [facility] 12/30/2019, 2:58 PM   If 7PM-7AM, please contact night-coverage [email or web address]

## 2019-12-30 NOTE — Progress Notes (Signed)
CCMD notifies RN of prolonged QTC>500 RN notified MD. No new orders at this time. RN will proceed with discharge per order. I will continue to assess.

## 2019-12-30 NOTE — Evaluation (Signed)
Occupational Therapy Evaluation Patient Details Name: Jaime Hall MRN: [id number] DOB: 1951-03-29 Today's Date: 12/30/2019    History of Present Illness Jaime Hall is a 69 y.o. female with medical history significant of hypertension, hyperlipidemia, GERD, depression, Hodgkin lymphoma, endometrial cancer on chemotherapy (last dose was on 12/28/19), CAD, DES stent placement, PVD, CKD stage III, CHF with EF 30-35%, bronchiectasis, who presents with chest pain and SOB   Clinical Impression   Jaime Hall was seen for OT evaluation this date. Prior to hospital admission, pt was Independent c I/ADLs. Pt lives alone c her cat and has son/DIL available PRN. Pt presents to acute OT near baseline Independence requiring no AD or assist for LBD, tooth brushing standing sink side, simulated toilet t/f, and ~200 ft mobility. Pt HR noted to increase to 147 during mobility, pt educated in energy conservation strategies including pursed lip breathing, activity pacing, home/routines modifications, work simplification, prioritizing of meaningful occupations, and falls prevention. Pt verbalized understanding. No skilled acute OT needs identified at this time. Will sign off. Please re-consult if new needs arise.       Follow Up Recommendations  No OT follow up    Equipment Recommendations   (grab bars )    Recommendations for Other Services       Precautions / Restrictions Precautions Precautions: None Restrictions Weight Bearing Restrictions: No      Mobility Bed Mobility Overal bed mobility: Modified Independent             General bed mobility comments: HoB elevated  Transfers Overall transfer level: Independent Equipment used: None             General transfer comment: sit<>stand c no AD and no LOBs    Balance Overall balance assessment: No apparent balance deficits (not formally assessed)                                         ADL either performed  or assessed with clinical judgement   ADL Overall ADL's : Independent                                       General ADL Comments: Independent for LBD, tooth brushing standin sink side, simulated toilet t/f, and mobility for household distances.     Vision Baseline Vision/History: Wears glasses Wears Glasses: At all times       Perception     Praxis      Pertinent Vitals/Pain Pain Assessment: No/denies pain     Hand Dominance Right   Extremity/Trunk Assessment Upper Extremity Assessment Upper Extremity Assessment: Overall WFL for tasks assessed   Lower Extremity Assessment Lower Extremity Assessment: Overall WFL for tasks assessed       Communication Communication Communication: No difficulties   Cognition Arousal/Alertness: Awake/alert Behavior During Therapy: WFL for tasks assessed/performed Overall Cognitive Status: Within Functional Limits for tasks assessed                                     General Comments  HR during lap around nurses station 147 - decreased c seated rest    Exercises Exercises: Other exercises Other Exercises Other Exercises: Pt and family educated re: OT role, d/c recs, ECS, falls  prevention, home/routines modifications Other Exercises: LBD, tooth brushing, sup<>sit, sit<>stand, sitting/standing balance/tolerance, ~200 ft mobility,    Shoulder Instructions      Home Living Family/patient expects to be discharged to:: Private residence Living Arrangements: Alone Available Help at Discharge: Family;Available PRN/intermittently Type of Home: House Home Access: Stairs to enter [employer] of Steps: 2   Home Layout: One level     Bathroom Shower/Tub: Tub/shower unit                    Prior Functioning/Environment Level of Independence: Independent        Comments: Pt reports Independent c I/ADLs, limits time outside 2/2 fatigue from chemo         OT Problem List:  Cardiopulmonary status limiting activity      OT Treatment/Interventions:      OT Goals(Current goals can be found in the care plan section) Acute Rehab OT Goals Patient Stated Goal: To return home  OT Goal Formulation: With patient Time For Goal Achievement: 01/13/20 Potential to Achieve Goals: Good  OT Frequency:     Barriers to D/C:            Co-evaluation              AM-PAC OT "6 Clicks" Daily Activity     Outcome Measure Help from another person eating meals?: None Help from another person taking care of personal grooming?: None Help from another person toileting, which includes using toliet, bedpan, or urinal?: None Help from another person bathing (including washing, rinsing, drying)?: A Little Help from another person to put on and taking off regular upper body clothing?: None Help from another person to put on and taking off regular lower body clothing?: None 6 Click Score: 23   End of Session    Activity Tolerance: Patient tolerated treatment well Patient left: in bed;with call bell/phone within reach;with family/visitor present  OT Visit Diagnosis: Other abnormalities of gait and mobility (R26.89)                Time: 9794-8016 OT Time Calculation (min): 15 min Charges:  OT General Charges $OT Visit: 1 Visit OT Evaluation $OT Eval Low Complexity: 1 Low OT Treatments $Self Care/Home Management : 8-22 mins  Dessie Coma, M.S. OTR/L  12/30/19, 12:50 PM

## 2019-12-30 NOTE — Progress Notes (Addendum)
Progress Note  Patient Name: Jaime Hall Date of Encounter: 12/30/2019  Primary Cardiologist: Jaime [place], MD   Subjective   She denies any further chest pain.  She reports that she is still coughing up a minimal amount of blood / small amount hemoptysis as of this morning.  No SOB/DOE.   She reports she has been up and ambulated around the room without any presyncope or syncope.  She denies any further "stars."  She reports IM is currently running some tests for possible transfusion later today, given her severe anemia.  Reviewed her high-sensitivity troponin elevation.  Joined today by her daughter, Jaime Hall.   Inpatient Medications    Scheduled Meds: . aspirin EC  81 mg Oral QHS  . atorvastatin  10 mg Oral QPM  . Chlorhexidine Gluconate Cloth  6 each Topical Daily  . cholecalciferol  1,000 Units Oral Daily  . doxycycline  100 mg Oral Q12H  . DULoxetine  30 mg Oral Daily  . furosemide  20 mg Intravenous Daily  . pantoprazole  20 mg Oral Daily  . sodium chloride flush  10-40 mL Intracatheter Q12H   Continuous Infusions:  PRN Meds: acetaminophen, albuterol, dextromethorphan-guaiFENesin, hydrALAZINE, lidocaine-prilocaine, LORazepam, morphine injection, nitroGLYCERIN, ondansetron (ZOFRAN) IV, oxyCODONE, sodium chloride flush   Vital Signs    Vitals:   12/29/19 1944 12/29/19 2024 12/30/19 0342 12/30/19 1019  BP: (!) 116/40 (!) 106/39 (!) 99/51 (!) 90/48  Pulse: 84 87 83 (!) 53  Resp: 19     Temp: 98.3 F (36.8 C) 98.3 F (36.8 C) 98.2 F (36.8 C) 98.4 F (36.9 C)  TempSrc: Oral Oral  Oral  SpO2:  94% 98% 99%  Weight: 72.8 kg  73 kg   Height: 5\' 1"  (1.549 m)       Intake/Output Summary (Last 24 hours) at 12/30/2019 1050 Last data filed at 12/30/2019 1000 Gross per 24 hour  Intake 120 ml  Output 300 ml  Net -180 ml   Last 3 Weights 12/30/2019 12/29/2019 12/28/2019  Weight (lbs) 161 lb 160 lb 8 oz 162 lb  Weight (kg) 73.029 kg 72.802 kg 73.483 kg        Telemetry     NSR- Personally Reviewed  ECG    No new tracings- Personally Reviewed  Physical Exam   GEN: No acute distress.  Joined by her daughter. Neck: No JVD Cardiac: RRR, 1/6 systolic murmur. No rubs or gallops. RUSB with Port-A-Cath. Respiratory: Clear to auscultation bilaterally. GI: Soft, nontender, non-distended  MS: No edema; No deformity. Neuro:  Nonfocal  Psych: Normal affect   Labs    High Sensitivity Troponin:   Recent Labs  Lab 12/29/19 0924 12/29/19 1134 12/29/19 1430 12/29/19 1856 12/30/19 0457  TROPONINIHS 250* 593* 1,211* 1,993* 1,130*      Chemistry Recent Labs  Lab 12/28/19 0751 12/29/19 0625 12/30/19 0457  NA 141 140 140  K 3.9 4.4 3.7  CL 106 105 102  CO2 27 26 28   GLUCOSE 100* 103* 98  BUN 14 18 23   CREATININE 1.36* 1.26* 1.26*  CALCIUM 8.3* 8.5* 8.4*  PROT 5.9*  --   --   ALBUMIN 3.3*  --   --   AST 26  --   --   ALT 25  --   --   ALKPHOS 113  --   --   BILITOT 0.7  --   --   GFRNONAA 39* 43* 43*  GFRAA 46* 50* 50*  ANIONGAP 8 9 10  Hematology Recent Labs  Lab 12/28/19 0751 12/29/19 0625 12/30/19 0457  WBC 9.4 17.4* 4.2  RBC 2.69* 2.89* 2.46*  HGB 8.1* 8.7* 7.3*  HCT 24.9* 27.1* 23.0*  MCV 92.6 93.8 93.5  MCH 30.1 30.1 29.7  MCHC 32.5 32.1 31.7  RDW 19.3* 19.9* 20.1*  PLT 146* 218 181    BNP Recent Labs  Lab 12/29/19 0625  BNP 898.1*     DDimer No results for input(s): DDIMER in the last 168 hours.   Radiology    CT Angio Chest PE W and/or Wo Contrast  Result Date: 12/29/2019 CLINICAL DATA:  Shortness of breath EXAM: CT ANGIOGRAPHY CHEST WITH CONTRAST TECHNIQUE: Multidetector CT imaging of the chest was performed using the standard protocol during bolus administration of intravenous contrast. Multiplanar CT image reconstructions and MIPs were obtained to evaluate the vascular anatomy. CONTRAST:  38mL OMNIPAQUE IOHEXOL 350 MG/ML SOLN COMPARISON:  Head CT 10/18/2019 FINDINGS: Cardiovascular: Normal  heart size. No pericardial effusion. Coronary stenting. No pulmonary artery filling defect. Atherosclerotic calcification of the aorta. Mediastinum/Nodes: Paramediastinal fibrosis. There is history of Hodgkin's lymphoma. No enlarged lymph nodes currently. Lungs/Pleura: Perihilar radiation fibrosis. Somewhat centrilobular faint ground-glass density in the lower lungs not seen on comparison PET-CT. Small nodule at the right minor fissure, stable. No consolidation, effusion, or pneumothorax. Tiny fatty Bochdalek's hernia on the left Upper Abdomen: Cholecystectomy. Musculoskeletal: No acute or aggressive finding Review of the MIP images confirms the above findings. IMPRESSION: Mild ground-glass opacity in the lower lungs which is nonspecific. Pneumonitis from atypical infection or patient's chemotherapy are considerations. Negative for pulmonary embolism. Electronically Signed   By: Monte Fantasia M.D.   On: 12/29/2019 08:39   DG Chest Portable 1 View  Result Date: 12/29/2019 CLINICAL DATA:  69 year old female with history of chest pain. EXAM: PORTABLE CHEST 1 VIEW COMPARISON:  Chest x-ray 08/05/2016. FINDINGS: There is a right-sided internal jugular central venous catheter with tip terminating in the distal superior vena cava. Lung volumes are normal. Paramediastinal radiation fibrosis again noted. No consolidative airspace disease. No pleural effusions. No pneumothorax. No pulmonary nodule or mass noted. Pulmonary vasculature and the cardiomediastinal silhouette are within normal limits. Atherosclerosis in the thoracic aorta. IMPRESSION: 1. Support apparatus, as above. 2. No radiographic evidence of acute cardiopulmonary disease. 3. Postradiation changes in the chest, similar to the prior study. 4. Aortic atherosclerosis. Electronically Signed   By: Vinnie Langton M.D.   On: 12/29/2019 06:51   ECHOCARDIOGRAM COMPLETE  Result Date: 12/29/2019    ECHOCARDIOGRAM REPORT   [name] Date  of Exam: 12/29/2019 Medical Rec #:  [id number]         Height:       61.0 in Accession #:    [id number]        Weight:       162.0 lb Date of Birth:  1950/12/20         BSA:          1.727 m Patient Age:    65 years          BP:           97/52 mmHg Patient Gender: F                 HR:           80 bpm. Exam Location:  [facility] Procedure: 2D Echo, Cardiac Doppler, Color Doppler and Intracardiac  Opacification Agent Indications:     CHF- acute systolic 798.92  History:         Patient has prior history of Echocardiogram examinations, most                  recent 11/02/2019. Signs/Symptoms:Murmur; Risk                  Factors:Hypertension.  Sonographer:     Sherrie Sport RDCS (AE) Referring Phys:  [place] Diagnosing Phys: Ida Rogue MD IMPRESSIONS  1. Left ventricular ejection fraction, by estimation, is 30 to 35%. The left ventricle has moderately decreased function. The left ventricle demonstrates global hypokinesis. Left ventricular diastolic parameters are consistent with Grade I diastolic dysfunction (impaired relaxation).  2. Right ventricular systolic function is normal. The right ventricular size is normal. There is mildly elevated pulmonary artery systolic pressure. The estimated right ventricular systolic pressure is 11.9 mmHg.  3. The mitral valve is normal in structure. Mild mitral valve regurgitation.  4. The aortic valve is normal in structure. Aortic valve regurgitation is mild. FINDINGS  Left Ventricle: Left ventricular ejection fraction, by estimation, is 30 to 35%. The left ventricle has moderately decreased function. The left ventricle demonstrates global hypokinesis. Definity contrast agent was given IV to delineate the left ventricular endocardial borders. The left ventricular internal cavity size was normal in size. There is no left ventricular hypertrophy. Left ventricular diastolic parameters are consistent with Grade I diastolic dysfunction (impaired relaxation). Right  Ventricle: The right ventricular size is normal. No increase in right ventricular wall thickness. Right ventricular systolic function is normal. There is mildly elevated pulmonary artery systolic pressure. The tricuspid regurgitant velocity is 2.82  m/s, and with an assumed right atrial pressure of 5 mmHg, the estimated right ventricular systolic pressure is 41.7 mmHg. Left Atrium: Left atrial size was normal in size. Right Atrium: Right atrial size was normal in size. Pericardium: There is no evidence of pericardial effusion. Mitral Valve: The mitral valve is normal in structure. Normal mobility of the mitral valve leaflets. Mild mitral valve regurgitation. No evidence of mitral valve stenosis. Tricuspid Valve: The tricuspid valve is normal in structure. Tricuspid valve regurgitation is not demonstrated. No evidence of tricuspid stenosis. Aortic Valve: The aortic valve is normal in structure. Aortic valve regurgitation is mild. Aortic regurgitation PHT measures 505 msec. No aortic stenosis is present. Aortic valve mean gradient measures 6.0 mmHg. Aortic valve peak gradient measures 12.8 mmHg. Aortic valve area, by VTI measures 2.30 cm. Pulmonic Valve: The pulmonic valve was normal in structure. Pulmonic valve regurgitation is not visualized. No evidence of pulmonic stenosis. Aorta: The aortic root is normal in size and structure. Venous: The inferior vena cava is normal in size with greater than 50% respiratory variability, suggesting right atrial pressure of 3 mmHg. IAS/Shunts: No atrial level shunt detected by color flow Doppler.  LEFT VENTRICLE PLAX 2D LVIDd:         5.48 cm      Diastology LVIDs:         4.59 cm      LV e' lateral:   3.59 cm/s LV PW:         0.82 cm      LV E/e' lateral: 22.8 LV IVS:        0.76 cm      LV e' medial:    5.44 cm/s LVOT diam:     2.00 cm      LV E/e' medial:  15.1 LV SV:         66 LV SV Index:   38 LVOT Area:     3.14 cm  LV Volumes (MOD) LV vol d, MOD A2C: 111.0 ml LV vol d,  MOD A4C: 129.0 ml LV vol s, MOD A2C: 55.8 ml LV vol s, MOD A4C: 71.6 ml LV SV MOD A2C:     55.2 ml LV SV MOD A4C:     129.0 ml LV SV MOD BP:      56.5 ml RIGHT VENTRICLE RV Basal diam:  2.09 cm RV S prime:     13.60 cm/s TAPSE (M-mode): 2.7 cm LEFT ATRIUM             Index       RIGHT ATRIUM          Index LA diam:        3.70 cm 2.14 cm/m  RA Area:     9.33 cm LA Vol (A2C):   65.4 ml 37.87 ml/m RA Volume:   14.70 ml 8.51 ml/m LA Vol (A4C):   53.5 ml 30.98 ml/m LA Biplane Vol: 64.0 ml 37.06 ml/m  AORTIC VALVE                    PULMONIC VALVE AV Area (Vmax):    1.66 cm     PV Vmax:        0.79 m/s AV Area (Vmean):   1.96 cm     PV Peak grad:   2.5 mmHg AV Area (VTI):     2.30 cm     RVOT Peak grad: 4 mmHg AV Vmax:           179.00 cm/s AV Vmean:          104.000 cm/s AV VTI:            0.287 m AV Peak Grad:      12.8 mmHg AV Mean Grad:      6.0 mmHg LVOT Vmax:         94.60 cm/s LVOT Vmean:        64.900 cm/s LVOT VTI:          0.210 m LVOT/AV VTI ratio: 0.73 AI PHT:            505 msec  AORTA Ao Root diam: 2.30 cm MITRAL VALVE                TRICUSPID VALVE MV Area (PHT): 4.49 cm     TR Peak grad:   31.8 mmHg MV Decel Time: 169 msec     TR Vmax:        282.00 cm/s MV E velocity: 82.00 cm/s MV A velocity: 104.00 cm/s  SHUNTS MV E/A ratio:  0.79         Systemic VTI:  0.21 m                             Systemic Diam: 2.00 cm Ida Rogue MD Electronically signed by Ida Rogue MD Signature Date/Time: 12/29/2019/6:51:10 PM    Final     Cardiac Studies   12/29/2019 Echo 1. Left ventricular ejection fraction, by estimation, is 30 to 35%. The  left ventricle has moderately decreased function. The left ventricle  demonstrates global hypokinesis. Left ventricular diastolic parameters are  consistent with Grade I diastolic  dysfunction (impaired relaxation).  2. Right ventricular systolic function is normal. The  right ventricular  size is normal. There is mildly elevated pulmonary artery systolic   pressure. The estimated right ventricular systolic pressure is 17.9 mmHg.  3. The mitral valve is normal in structure. Mild mitral valve  regurgitation.  4. The aortic valve is normal in structure. Aortic valve regurgitation is  mild.   12/04/2019 R/LHC  There is moderate to severe left ventricular systolic dysfunction.  LV end diastolic pressure is moderately elevated.  Previously placed Prox LAD stent (unknown type) is widely patent.  Ost LAD to Prox LAD lesion is 40% stenosed. 1. Patent LAD stent with no significant restenosis. There is moderate stenosis in ostial LAD before the stent. No evidence of obstructive disease overall. 2. Moderately to severely reduced LV systolic function with an EF of 30 to 35%. 3. Right heart catheterization showed mildly elevated filling pressures with pulmonary capillary wedge pressure of 18 mmHg, mild to moderate pulmonary hypertension at 44/19 with a mean of 33 mmHg, and mildly reduced cardiac output at 3.89 with a cardiac index of 2.24. Pulmonary vascular resistance is 3.8 [place] units Recommendations: Continue medical therapy for coronary artery disease. 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 She is mildly volume overloaded and I added small dose furosemide. Recommend medical therapy for cardiomyopathy.  Echo 11/02/2019 1. Left ventricular ejection fraction, by estimation, is 30 to 35%. The  left ventricle has moderately decreased function. The left ventricle  demonstrates global hypokinesis. Left ventricular diastolic parameters are  consistent with Grade I diastolic  dysfunction (impaired relaxation).  2. Right ventricular systolic function is normal. The right ventricular  size is normal. There is mildly elevated pulmonary artery systolic  pressure. The estimated right ventricular systolic pressure is 15.0 mmHg.  3. Mild to moderate mitral valve regurgitation.  4. Aortic valve  regurgitation is mild to moderate.    Patient Profile     69 y.o. 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  Assessment & Plan    NSTEMI CAD s/p stenting and angioplasty --No current CP. Earlier CP, SOB, hemoptysis. Still small amount hemoptysis this AM. CP likely multifactorial in the setting of her comorbid conditions, including pulmonary HTN, low output HF with known reduced CO, valular dz. Considered also is anemia with hemoptysis and imaging with lung changes 2/2 atypical infection versus chemotherapy.  --Suspect supply demand ischemia in setting of comorbid conditions with recent LHC as above. HS Tn peaked at 1,999. EKG shows progression of LBBB from incomplete to complete but otherwise no acute changes.  --Echo updated as above with EF 30-35% and unchanged from previous.   --Not an ideal catheterization candidate, given her comorbid conditions as detailed. --At this time, no indication for further emergent ischemic workup.  --Given anemia, do not recommend IV heparin.  --Continue current medications as BP and renal function allow.   HFrEF --Resolution of SOB with resolution of CP and cough.  --Updated echo with EF stable at 30-35%, G1DD, RVSP 36.72mmHg.  --Diuresis has been complicated by orthostasis/BP and chemotherapy.  --Continue PTA lasix PRN dosing. --Euvolemic and well compensated on exam. --Monitor I/Os, daily weights. --Daily BMET. Current renal function stable. --Continue current medications as BP and renal function allow.   Hemoptysis --Occurred after start of CP. Anemic on presentation. Consider as  contributing to sx. Further workup of anemia / possible pneumonitis per IM.    Hypotension History of HTN --BP soft to hypotensive. Likely contributing to her orthostasis sx.  --Caution with lasix. Continue to hold BB for now.  --Avoid pre-renal AKI.   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 Could contribute to her symptoms of orthostasis. Continue to monitor with periodic outpatient echos.   Anemia --Daily CBC. As above, could contribute to her CP, LEE, and SOB. Consider also her hemoptysis with further workup per IM. Possible transfusion today.  Hypoalbuminemia --Likely contributing to sx. Monitor. Per IM.   HLD --Continue statin.  For questions or updates, please contact [place] Please consult [email or web address] for contact info under     Signed, Arvil Chaco, PA-C  12/30/2019, 10:50 AM      Patient seen, examined. Available data reviewed. Agree with findings, assessment, and plan as outlined by Marrianne Mood, PA-C. The patient is independently evaluated. She is alert, oriented, in NAD. Lungs CTA, heart RRR 2/6 systolic murmur at the LLSB, abd: soft, NT, ext: no edema.   Pt now CP-free, no further hemoptysis. Troponin has peaked at 1993 and now has trended down to 1130. As outlined above, conservative medical therapy is most appropriate in the setting of her recent heart catheterization demonstrating stent patency, anemia, and recent hemoptysis.   Sherren Mocha, M.D. 12/30/2019 5:03 PM

## 2020-01-02 ENCOUNTER — Telehealth: Payer: Self-pay | Admitting: Family

## 2020-01-02 ENCOUNTER — Telehealth: Payer: Self-pay

## 2020-01-02 NOTE — Telephone Encounter (Signed)
Spoke to patient who is doing well since discharge from the hospital. She has had some swelling but says that the lasix have taken care of that. She has no problems with her medications, checking her weight daily, staying active and following a low sodium diet. She confirmed her new patient CHF Clinic appointment with [place] for 8/19 at [place], [place]

## 2020-01-02 NOTE — Telephone Encounter (Signed)
Patient is following up with oncology and cardiology regarding last hospital discharge 12/30/19. Notes she is feeling goos now and declines hospital follow up with PMD. Agrees to call the office if anything changes or is needed.

## 2020-01-03 LAB — CULTURE, BLOOD (ROUTINE X 2)
Culture: NO GROWTH
Culture: NO GROWTH
Special Requests: ADEQUATE
Special Requests: ADEQUATE

## 2020-01-04 ENCOUNTER — Inpatient Hospital Stay: Payer: PPO

## 2020-01-04 ENCOUNTER — Telehealth: Payer: Self-pay | Admitting: Pharmacist

## 2020-01-04 ENCOUNTER — Encounter: Payer: Self-pay | Admitting: Oncology

## 2020-01-04 ENCOUNTER — Inpatient Hospital Stay (HOSPITAL_BASED_OUTPATIENT_CLINIC_OR_DEPARTMENT_OTHER): Payer: PPO | Admitting: Oncology

## 2020-01-04 ENCOUNTER — Other Ambulatory Visit: Payer: Self-pay

## 2020-01-04 VITALS — BP 108/55 | HR 81 | Temp 98.0°F | Resp 16 | Ht 61.0 in | Wt 160.5 lb

## 2020-01-04 DIAGNOSIS — D6481 Anemia due to antineoplastic chemotherapy: Secondary | ICD-10-CM | POA: Diagnosis not present

## 2020-01-04 DIAGNOSIS — C541 Malignant neoplasm of endometrium: Secondary | ICD-10-CM

## 2020-01-04 DIAGNOSIS — Z7189 Other specified counseling: Secondary | ICD-10-CM

## 2020-01-04 DIAGNOSIS — T451X5A Adverse effect of antineoplastic and immunosuppressive drugs, initial encounter: Secondary | ICD-10-CM

## 2020-01-04 DIAGNOSIS — Z5112 Encounter for antineoplastic immunotherapy: Secondary | ICD-10-CM | POA: Diagnosis not present

## 2020-01-04 DIAGNOSIS — C786 Secondary malignant neoplasm of retroperitoneum and peritoneum: Secondary | ICD-10-CM | POA: Diagnosis not present

## 2020-01-04 LAB — CBC WITH DIFFERENTIAL/PLATELET
Abs Immature Granulocytes: 0.02 10*3/uL (ref 0.00–0.07)
Basophils Absolute: 0 10*3/uL (ref 0.0–0.1)
Basophils Relative: 1 %
Eosinophils Absolute: 0 10*3/uL (ref 0.0–0.5)
Eosinophils Relative: 0 %
HCT: 23.6 % — ABNORMAL LOW (ref 36.0–46.0)
Hemoglobin: 7.8 g/dL — ABNORMAL LOW (ref 12.0–15.0)
Immature Granulocytes: 1 %
Lymphocytes Relative: 13 %
Lymphs Abs: 0.4 10*3/uL — ABNORMAL LOW (ref 0.7–4.0)
MCH: 30.1 pg (ref 26.0–34.0)
MCHC: 33.1 g/dL (ref 30.0–36.0)
MCV: 91.1 fL (ref 80.0–100.0)
Monocytes Absolute: 0.1 10*3/uL (ref 0.1–1.0)
Monocytes Relative: 5 %
Neutro Abs: 2.3 10*3/uL (ref 1.7–7.7)
Neutrophils Relative %: 80 %
Platelets: 173 10*3/uL (ref 150–400)
RBC: 2.59 MIL/uL — ABNORMAL LOW (ref 3.87–5.11)
RDW: 18.6 % — ABNORMAL HIGH (ref 11.5–15.5)
WBC: 2.9 10*3/uL — ABNORMAL LOW (ref 4.0–10.5)
nRBC: 0 % (ref 0.0–0.2)

## 2020-01-04 LAB — COMPREHENSIVE METABOLIC PANEL
ALT: 65 U/L — ABNORMAL HIGH (ref 0–44)
AST: 51 U/L — ABNORMAL HIGH (ref 15–41)
Albumin: 3.5 g/dL (ref 3.5–5.0)
Alkaline Phosphatase: 87 U/L (ref 38–126)
Anion gap: 7 (ref 5–15)
BUN: 20 mg/dL (ref 8–23)
CO2: 29 mmol/L (ref 22–32)
Calcium: 9.3 mg/dL (ref 8.9–10.3)
Chloride: 103 mmol/L (ref 98–111)
Creatinine, Ser: 1.12 mg/dL — ABNORMAL HIGH (ref 0.44–1.00)
GFR calc Af Amer: 58 mL/min — ABNORMAL LOW (ref >60)
GFR calc non Af Amer: 50 mL/min — ABNORMAL LOW (ref >60)
Glucose, Bld: 104 mg/dL — ABNORMAL HIGH (ref 70–99)
Potassium: 4.2 mmol/L (ref 3.5–5.1)
Sodium: 139 mmol/L (ref 135–145)
Total Bilirubin: 1 mg/dL (ref 0.3–1.2)
Total Protein: 6.5 g/dL (ref 6.5–8.1)

## 2020-01-04 LAB — SAMPLE TO BLOOD BANK

## 2020-01-04 MED ORDER — SODIUM CHLORIDE 0.9% FLUSH
10.0000 mL | INTRAVENOUS | Status: AC | PRN
  Administered 2020-01-04: 10 mL via INTRAVENOUS
  Filled 2020-01-04: qty 10

## 2020-01-04 MED ORDER — HEPARIN SOD (PORK) LOCK FLUSH 100 UNIT/ML IV SOLN
500.0000 [IU] | Freq: Once | INTRAVENOUS | Status: AC
  Filled 2020-01-04: qty 5

## 2020-01-04 NOTE — Telephone Encounter (Signed)
Oral Oncology Pharmacist Encounter  Received new prescription for Lenvima (lenvatinib) for the treatment of stage IV high-grade serous endometrial cancer in conjunction with pembrolizumab, planned duration until disease progression or unacceptable drug toxicity.  CMP from 01/04/20 assessed, no relevant lab abnormalities. UA and TSH ordered. Prescription dose and frequency assessed.   Current medication list in Epic reviewed, no DDIs with lenvatinib identified.  Prescription has been e-scribed to the [facility] for benefits analysis and approval.  Oral Oncology Clinic will continue to follow for insurance authorization, copayment issues, initial counseling and start date.  Darl Pikes, PharmD, BCPS, BCOP, CPP Hematology/Oncology Clinical Pharmacist Practitioner [facility] [phone]  01/04/2020 4:09 PM

## 2020-01-04 NOTE — Progress Notes (Signed)
Hematology/Oncology Consult note [facility]  Telephone:([phone] [phone]  Patient Care Team: [name], MD as PCP - General (Internal Medicine) Wellington Hampshire, MD as PCP - Cardiology (Cardiology) [name], MD as Consulting Physician (General Surgery) Mellody Drown, MD as Referring Physician (Obstetrics and Gynecology) [name], MD as Referring Physician (Obstetrics and Gynecology) Hollice Espy, MD as Consulting Physician (Urology) [name], RN as Registered Nurse Sindy Guadeloupe, MD as Consulting Physician (Oncology)   Name of the patient: Katye Valek  [id number]  [birth year]   Date of visit: 01/04/20  Diagnosis- 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 She underwent robotic hysterectomy with bilateral salpingo-oophorectomy with washings and sentinel lymph node injection mapping and biopsy in February 2018. She had multiple postoperative complications and completed 3 cycles of carbotaxol chemotherapy in May 2018. Treatment was complicated by chemo-induced peripheral neuropathy for which Taxol dose was reduced by 25%. She completed 5 weeks of external beam whole pelvic radiation on August 2018.Her original tumor in 2018 was ER +1%. PR negative. MSI stable and HER-2 negative  She was under clinical surveillance and recently presented to [name] with symptoms of right lower quadrant abdominal pain which led to aCT abdomen which showed new peritoneal metastatic disease throughout the right abdomen and pelvis with index masses measuring 5.7 x 4.7 cm. Head CT  scan also showed evidence of hypermetabolic abdominal pelvic peritoneal metastases but no evidence of other sites of metastases.  Repeat omental biopsy confirms high grade serous carcinoma.Foundation 1 testing showed following alteration/biomarkers. Microsatellite status stable. EPH for be amplification, ESR 1 amplification, HNF1 A G 292 FS 25, follow-up 1 amplification, with 3 ER 1 loss, PPP 2R1 AAS 256F, PTEN loss, T p53. However none of these mutations have actionable targets.  Repeat echocardiogram showed EF of 30 to 35%. Cardiac cath unremarkable consistent with nonischemic cardiomyopathy.Therefore Doxil not chosen and she would be getting carboplatin and gemcitabine.  Chemotherapy complicated by significant anemia causing supply demand cardiac ischemia and hospitalization.    Interval history-after receiving cycle 3-day 1 of carboplatin and gemcitabine patient presented to the hospital the next day with symptoms of worsening shortness of breath and chest pain.  Troponin levels were elevated.  Echocardiogram was overall stable and medical management was recommended.  Currently patient reports feeling significantly fatigued after every chemotherapy.  She does not report any chest pain or shortness of breath today.  ECOG PS- 1 Pain scale- 0  Review of systems- Review of Systems  Constitutional: Positive for malaise/fatigue. Negative for chills, fever and weight loss.  HENT: Negative for congestion, ear discharge and nosebleeds.   Eyes: Negative for blurred vision.  Respiratory: Negative for cough, hemoptysis, sputum production, shortness of breath and wheezing.   Cardiovascular: Negative for chest pain, palpitations, orthopnea and claudication.  Gastrointestinal: Negative for abdominal pain, blood in stool, constipation, diarrhea, heartburn, melena, nausea and vomiting.  Genitourinary: Negative for dysuria, flank pain, frequency, hematuria and urgency.  Musculoskeletal: Negative for  back pain, joint pain and myalgias.  Skin: Negative for rash.  Neurological: Negative for dizziness, tingling, focal weakness, seizures, weakness and headaches.  Endo/Heme/Allergies: Does not bruise/bleed easily.  Psychiatric/Behavioral: Negative  for depression and suicidal ideas. The patient does not have insomnia.      Allergies  Allergen Reactions  . Adhesive [Tape] Other (See Comments)    Burns the skin  . Z-Pak [Azithromycin] Itching  . Antifungal [Miconazole Nitrate] Rash  . Sulfa Antibiotics Nausea And Vomiting and Rash    N/T     Past Medical History:  Diagnosis Date  . Anxiety   . Cervicalgia   . Coronary artery disease    a. 02/2012 Stress echo: severe anterior wall ischemia;  b. 02/2012 Cath/PCI: LAD 95p (3.0 x 15 Xience EX DES), D1 90ost (PTCA - bifurcational dzs), EF 45% with anterior HK;  b. 02/2013 Ex MV: fixed anterior defect w/ minor reversibility, nl EF-->Med Rx.  . Endometrial cancer ([place])    a. 07/2016 s/p robotic hysterectomy, BSO w/ washings, sentinel node inj, mapping, bx, adhesiolysis.  . Essential hypertension, benign   . Fibrocystic breast disease   . GERD (gastroesophageal reflux disease)   . Gestational hypertension   . Heart murmur   . History of anemia   . History of blood transfusion   . Hodgkin's lymphoma ([place]) 2011   a. s/p radiation and chemo therapy  . Osteoarthritis   . Polycystic ovarian disease      Past Surgical History:  Procedure Laterality Date  . ABDOMINAL HYSTERECTOMY    . bladder sling    . CARDIAC CATHETERIZATION  02/2012   [facility] 1 stent place  . CERVICAL POLYPECTOMY    . CHOLECYSTECTOMY  1982  . COLONOSCOPY WITH PROPOFOL N/A 02/05/2015   Procedure: COLONOSCOPY WITH PROPOFOL;  Surgeon: Lucilla Lame, MD;  Location: [facility] ENDOSCOPY;  Service: Endoscopy;  Laterality: N/A;  . CORONARY ANGIOPLASTY  02/2012   left/right s/p balloon  . CYSTOGRAM N/A 08/17/2016   Procedure: CYSTOGRAM;  Surgeon: Hollice Espy, MD;  Location: [facility] ORS;   Service: Urology;  Laterality: N/A;  . CYSTOSCOPY N/A 08/17/2016   Procedure: CYSTOSCOPY EXAM UNDER ANESTHESIA;  Surgeon: Hollice Espy, MD;  Location: [facility] ORS;  Service: Urology;  Laterality: N/A;  . CYSTOSCOPY W/ RETROGRADES Bilateral 08/17/2016   Procedure: CYSTOSCOPY WITH RETROGRADE PYELOGRAM;  Surgeon: Hollice Espy, MD;  Location: [facility] ORS;  Service: Urology;  Laterality: Bilateral;  . CYSTOSCOPY WITH STENT PLACEMENT Right 08/17/2016   Procedure: CYSTOSCOPY WITH STENT PLACEMENT;  Surgeon: Hollice Espy, MD;  Location: [facility] ORS;  Service: Urology;  Laterality: Right;  . heart stent'  2013  . kidney stent Right 2018  . LYMPH NODE BIOPSY  2011   diagnosis of hodgkins lymphoma  . PELVIC LYMPH NODE DISSECTION N/A 07/29/2016   Procedure: PELVIC/AORTIC LYMPH NODE SAMPLING;  Surgeon: [name], MD;  Location: [facility] ORS;  Service: Gynecology;  Laterality: N/A;  . PORTA CATH INSERTION N/A 09/22/2016   Procedure: Glori Luis Cath Insertion;  Surgeon: [name], MD;  Location: [place] [facility];  Service: Cardiovascular;  Laterality: N/A;  . PORTA CATH INSERTION N/A 10/27/2019   Procedure: PORTA CATH INSERTION;  Surgeon: [name], MD;  Location: [place] [facility];  Service: Cardiovascular;  Laterality: N/A;  . PORTA CATH REMOVAL N/A 11/17/2016   Procedure: Glori Luis Cath Removal;  Surgeon: [name], MD;  Location: [place] [facility];  Service: Cardiovascular;  Laterality: N/A;  . RIGHT/LEFT HEART CATH AND CORONARY ANGIOGRAPHY N/A 12/04/2019   Procedure: RIGHT/LEFT HEART CATH AND CORONARY ANGIOGRAPHY;  Surgeon: Wellington Hampshire, MD;  Location: [place] [facility];  Service: Cardiovascular;  Laterality: N/A;  .  ROBOTIC ASSISTED TOTAL HYSTERECTOMY WITH BILATERAL SALPINGO OOPHERECTOMY N/A 07/29/2016   Procedure: ROBOTIC ASSISTED TOTAL HYSTERECTOMY WITH BILATERAL SALPINGO OOPHORECTOMY;  Surgeon: [name], MD;  Location: [facility] OR[facility];  Service: Gynecology;   Laterality: N/A;  . SENTINEL NODE BIOPSY N/A 07/29/2016   Procedure: SENTINEL NODE BIOPSY;  Surgeon: [name], MD;  Location: [facility];  Service: Gynecology;  Laterality: N/A;  . transobturator sling N/A 2009   [place]    Social History   Socioeconomic History  . Marital status: Widowed    Spouse name: Not on file  . Number of children: Not on file  . Years of education: Not on file  . Highest education level: Not on file  Occupational History  . Not on file  Tobacco Use  . Smoking status: Former Smoker    Packs/day: 1.00    Years: 30.00    Pack years: 30.00    Types: Cigarettes    Quit date: 07/24/2002    Years since quitting: 17.4  . Smokeless tobacco: Never Used  . Tobacco comment: quit smoking in 2000  Vaping Use  . Vaping Use: Never used  Substance and Sexual Activity  . Alcohol use: Not Currently  . Drug use: No  . Sexual activity: Never  Other Topics Concern  . 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    Social Determinants of Health   Financial Resource Strain:   . Difficulty of Paying Living Expenses:   Food Insecurity:   . Worried About Charity fundraiser in the Last Year:   . Arboriculturist in the Last Year:   Transportation Needs:   . Film/video editor (Medical):   Marland Kitchen Lack of Transportation (Non-Medical):   Physical Activity:   . Days of Exercise per Week:   . Minutes of Exercise per Session:   Stress:   . Feeling of Stress :   Social Connections:   . Frequency of Communication with Friends and Family:   . Frequency of Social Gatherings with Friends and Family:   . Attends Religious Services:   . Active Member of Clubs or Organizations:   . Attends Archivist Meetings:   Marland Kitchen Marital Status:   Intimate Partner Violence:   . Fear of Current or Ex-Partner:   . Emotionally Abused:   Marland Kitchen Physically Abused:   . Sexually Abused:      Family History  Problem Relation Age of Onset  . ALS Father   . Polymyositis Father   . Diabetes Brother   . Cancer Maternal Aunt        breast  . Breast cancer Maternal Aunt        30's  . Stroke Maternal Grandmother   . Cancer Maternal Grandfather        prostate  . Stroke Maternal Grandfather   . Colon cancer Maternal Aunt   . Non-Hodgkin's lymphoma Cousin      Current Outpatient Medications:  .  aspirin EC 81 MG tablet, Take 81 mg by mouth at bedtime., Disp: , Rfl:  .  atorvastatin (LIPITOR) 10 MG tablet, Take 1 tablet by mouth once daily (Patient taking differently: Take 10 mg by mouth every evening. ), Disp: 90 tablet, Rfl: 2 .  cholecalciferol (VITAMIN D3) 25 MCG (1000 UNIT) tablet, Take 1,000 Units by mouth in the morning and at bedtime., Disp: , Rfl:  .  cyanocobalamin (,VITAMIN B-12,) 1000 MCG/ML injection, INJECT 1ML IM WEEKLY FOR 4 WEEKS, THEN INJECT MONTHLY THEREAFTER (Patient taking differently: Inject 1,000 mcg into the skin every 30 (thirty) days. ), Disp: 10 mL, Rfl: 0 .  DULoxetine (CYMBALTA) 30 MG capsule, Take 1 capsule by mouth once daily (Patient taking differently: Take 30 mg by mouth daily. ), Disp: 90 capsule, Rfl: 0 .  furosemide (LASIX) 20 MG tablet, Take 1 tablet (20 mg total) by mouth as needed for fluid or edema (Take one tablet daily as needed for lower extremity edema or for weight gain of 2 lbs overnight or 5 lbs in one week.)., Disp: 30 tablet, Rfl: 2 .  metoprolol tartrate (LOPRESSOR) 25 MG tablet, Take 1 tablet by mouth twice daily (Patient taking differently: Take 25 mg by mouth 2 (two) times daily. ), Disp: 180 tablet, Rfl: 2 .  pantoprazole (PROTONIX) 20 MG tablet, Take 1 tablet (20 mg total) by mouth daily., Disp: 60 tablet, Rfl: 1 .  oxyCODONE (OXY IR/ROXICODONE) 5 MG immediate release tablet, Take 1 tablet (5 mg total) by mouth every 6 (six) hours as needed for severe pain. (Patient not taking: Reported on 01/04/2020), Disp: 30 tablet, Rfl:  0 No current facility-administered medications for this visit.  Facility-Administered Medications Ordered in Other Visits:  .  heparin lock flush 100 unit/mL, 500 Units, Intravenous, Once, Randa Evens C, MD .  sodium chloride flush (NS) 0.9 % injection 10 mL, 10 mL, Intravenous, PRN, Sindy Guadeloupe, MD, 10 mL at 12/07/19 0914 .  sodium chloride flush (NS) 0.9 % injection 10 mL, 10 mL, Intravenous, PRN, Sindy Guadeloupe, MD, 10 mL at 01/04/20 0850  Physical exam:  Vitals:   01/04/20 0920  BP: (!) 108/55  Pulse: 81  Resp: 16  Temp: 98 F (36.7 C)  TempSrc: Oral  Weight: 160 lb 8 oz (72.8 kg)  Height: _0  (1.549 m)   Physical Exam Constitutional:      General: She is not in acute distress. Cardiovascular:     Rate and Rhythm: Normal rate and regular rhythm.     Heart sounds: Normal heart sounds.  Pulmonary:     Effort: Pulmonary effort is normal.     Breath sounds: Normal breath sounds.  Abdominal:     General: Bowel sounds are normal.     Palpations: Abdomen is soft.  Musculoskeletal:     Right lower leg: No edema.     Left lower leg: No edema.  Skin:    General: Skin is warm and dry.  Neurological:     Mental Status: She is alert and oriented to person, place, and time.      CMP Latest Ref Rng & Units 01/04/2020  Glucose 70 - 99 mg/dL 104(H)  BUN 8 - 23 mg/dL 20  Creatinine 0.44 - 1.00 mg/dL 1.12(H)  Sodium 135 - 145 mmol/L 139  Potassium 3.5 - 5.1 mmol/L 4.2  Chloride 98 - 111 mmol/L 103  CO2 22 - 32 mmol/L 29  Calcium 8.9 - 10.3 mg/dL 9.3  Total Protein 6.5 - 8.1 g/dL 6.5  Total Bilirubin 0.3 - 1.2 mg/dL 1.0  Alkaline Phos 38 - 126 U/L 87  AST 15 - 41 U/L 51(H)  ALT 0 - 44 U/L 65(H)   CBC Latest Ref Rng & Units 01/04/2020  WBC 4.0 - 10.5 K/uL 2.9(L)  Hemoglobin 12.0 - 15.0 g/dL 7.8(L)  Hematocrit 36 - 46 % 23.6(L)  Platelets 150 - 400 K/uL 173  CT Angio Chest PE W and/or Wo Contrast  Result Date: 12/29/2019 CLINICAL DATA:  Shortness of breath  EXAM: CT ANGIOGRAPHY CHEST WITH CONTRAST TECHNIQUE: Multidetector CT imaging of the chest was performed using the standard protocol during bolus administration of intravenous contrast. Multiplanar CT image reconstructions and MIPs were obtained to evaluate the vascular anatomy. CONTRAST:  21m OMNIPAQUE IOHEXOL 350 MG/ML SOLN COMPARISON:  Head CT 10/18/2019 FINDINGS: Cardiovascular: Normal heart size. No pericardial effusion. Coronary stenting. No pulmonary artery filling defect. Atherosclerotic calcification of the aorta. Mediastinum/Nodes: Paramediastinal fibrosis. There is history of Hodgkin's lymphoma. No enlarged lymph nodes currently. Lungs/Pleura: Perihilar radiation fibrosis. Somewhat centrilobular faint ground-glass density in the lower lungs not seen on comparison PET-CT. Small nodule at the right minor fissure, stable. No consolidation, effusion, or pneumothorax. Tiny fatty Bochdalek's hernia on the left Upper Abdomen: Cholecystectomy. Musculoskeletal: No acute or aggressive finding Review of the MIP images confirms the above findings. IMPRESSION: Mild ground-glass opacity in the lower lungs which is nonspecific. Pneumonitis from atypical infection or patient's chemotherapy are considerations. Negative for pulmonary embolism. Electronically Signed   By: JMonte FantasiaM.D.   On: 12/29/2019 08:39   DG Chest Portable 1 View  Result Date: 12/29/2019 CLINICAL DATA:  69year old female with history of chest pain. EXAM: PORTABLE CHEST 1 VIEW COMPARISON:  Chest x-ray 08/05/2016. FINDINGS: There is a right-sided internal jugular central venous catheter with tip terminating in the distal superior vena cava. Lung volumes are normal. Paramediastinal radiation fibrosis again noted. No consolidative airspace disease. No pleural effusions. No pneumothorax. No pulmonary nodule or mass noted. Pulmonary vasculature and the cardiomediastinal silhouette are within normal limits. Atherosclerosis in the thoracic aorta.  IMPRESSION: 1. Support apparatus, as above. 2. No radiographic evidence of acute cardiopulmonary disease. 3. Postradiation changes in the chest, similar to the prior study. 4. Aortic atherosclerosis. Electronically Signed   By: DVinnie LangtonM.D.   On: 12/29/2019 06:51   ECHOCARDIOGRAM COMPLETE  Result Date: 12/29/2019    ECHOCARDIOGRAM REPORT   Patient Name:   DMARCELINA MCLAURINDate of Exam: 12/29/2019 Medical Rec #:  [id number]        Height:       61.0 in Accession #:    [id number]       Weight:       162.0 lb Date of Birth:  [birth year]        BSA:          1.727 m Patient Age:    [age]years          BP:           97/52 mmHg Patient Gender: F                 HR:           80 bpm. Exam Location:  [facility] Procedure: 2D Echo, Cardiac Doppler, Color Doppler and Intracardiac            Opacification Agent Indications:     CHF- acute systolic 4790.24 History:         Patient has prior history of Echocardiogram examinations, most                  recent 11/02/2019. Signs/Symptoms:Murmur; Risk                  Factors:Hypertension.  Sonographer:     JSherrie SportRDCS (AE) Referring Phys:  3Bay CenterDiagnosing Phys: TIda RogueMD  IMPRESSIONS  1. Left ventricular ejection fraction, by estimation, is 30 to 35%. The left ventricle has moderately decreased function. The left ventricle demonstrates global hypokinesis. Left ventricular diastolic parameters are consistent with Grade I diastolic dysfunction (impaired relaxation).  2. Right ventricular systolic function is normal. The right ventricular size is normal. There is mildly elevated pulmonary artery systolic pressure. The estimated right ventricular systolic pressure is 57.2 mmHg.  3. The mitral valve is normal in structure. Mild mitral valve regurgitation.  4. The aortic valve is normal in structure. Aortic valve regurgitation is mild. FINDINGS  Left Ventricle: Left ventricular ejection fraction, by estimation, is 30 to 35%. The left ventricle has  moderately decreased function. The left ventricle demonstrates global hypokinesis. Definity contrast agent was given IV to delineate the left ventricular endocardial borders. The left ventricular internal cavity size was normal in size. There is no left ventricular hypertrophy. Left ventricular diastolic parameters are consistent with Grade I diastolic dysfunction (impaired relaxation). Right Ventricle: The right ventricular size is normal. No increase in right ventricular wall thickness. Right ventricular systolic function is normal. There is mildly elevated pulmonary artery systolic pressure. The tricuspid regurgitant velocity is 2.82  m/s, and with an assumed right atrial pressure of 5 mmHg, the estimated right ventricular systolic pressure is 62.0 mmHg. Left Atrium: Left atrial size was normal in size. Right Atrium: Right atrial size was normal in size. Pericardium: There is no evidence of pericardial effusion. Mitral Valve: The mitral valve is normal in structure. Normal mobility of the mitral valve leaflets. Mild mitral valve regurgitation. No evidence of mitral valve stenosis. Tricuspid Valve: The tricuspid valve is normal in structure. Tricuspid valve regurgitation is not demonstrated. No evidence of tricuspid stenosis. Aortic Valve: The aortic valve is normal in structure. Aortic valve regurgitation is mild. Aortic regurgitation PHT measures 505 msec. No aortic stenosis is present. Aortic valve mean gradient measures 6.0 mmHg. Aortic valve peak gradient measures 12.8 mmHg. Aortic valve area, by VTI measures 2.30 cm. Pulmonic Valve: The pulmonic valve was normal in structure. Pulmonic valve regurgitation is not visualized. No evidence of pulmonic stenosis. Aorta: The aortic root is normal in size and structure. Venous: The inferior vena cava is normal in size with greater than 50% respiratory variability, suggesting right atrial pressure of 3 mmHg. IAS/Shunts: No atrial level shunt detected by color flow  Doppler.  LEFT VENTRICLE PLAX 2D LVIDd:         5.48 cm      Diastology LVIDs:         4.59 cm      LV e' lateral:   3.59 cm/s LV PW:         0.82 cm      LV E/e' lateral: 22.8 LV IVS:        0.76 cm      LV e' medial:    5.44 cm/s LVOT diam:     2.00 cm      LV E/e' medial:  15.1 LV SV:         66 LV SV Index:   38 LVOT Area:     3.14 cm  LV Volumes (MOD) LV vol d, MOD A2C: 111.0 ml LV vol d, MOD A4C: 129.0 ml LV vol s, MOD A2C: 55.8 ml LV vol s, MOD A4C: 71.6 ml LV SV MOD A2C:     55.2 ml LV SV MOD A4C:     129.0 ml LV SV MOD BP:      56.5  ml RIGHT VENTRICLE RV Basal diam:  2.09 cm RV S prime:     13.60 cm/s TAPSE (M-mode): 2.7 cm LEFT ATRIUM             Index       RIGHT ATRIUM          Index LA diam:        3.70 cm 2.14 cm/m  RA Area:     9.33 cm LA Vol (A2C):   65.4 ml 37.87 ml/m RA Volume:   14.70 ml 8.51 ml/m LA Vol (A4C):   53.5 ml 30.98 ml/m LA Biplane Vol: 64.0 ml 37.06 ml/m  AORTIC VALVE                    PULMONIC VALVE AV Area (Vmax):    1.66 cm     PV Vmax:        0.79 m/s AV Area (Vmean):   1.96 cm     PV Peak grad:   2.5 mmHg AV Area (VTI):     2.30 cm     RVOT Peak grad: 4 mmHg AV Vmax:           179.00 cm/s AV Vmean:          104.000 cm/s AV VTI:            0.287 m AV Peak Grad:      12.8 mmHg AV Mean Grad:      6.0 mmHg LVOT Vmax:         94.60 cm/s LVOT Vmean:        64.900 cm/s LVOT VTI:          0.210 m LVOT/AV VTI ratio: 0.73 AI PHT:            505 msec  AORTA Ao Root diam: 2.30 cm MITRAL VALVE                TRICUSPID VALVE MV Area (PHT): 4.49 cm     TR Peak grad:   31.8 mmHg MV Decel Time: 169 msec     TR Vmax:        282.00 cm/s MV E velocity: 82.00 cm/s MV A velocity: 104.00 cm/s  SHUNTS MV E/A ratio:  0.79         Systemic VTI:  0.21 m                             Systemic Diam: 2.00 cm Ida Rogue MD Electronically signed by Ida Rogue MD Signature Date/Time: 12/29/2019/6:51:10 PM    Final      Assessment and plan- Patient is a 69 y.o. 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   She is currently on carboplatin and gemcitabine chemotherapy and is here for further discussion  Patient's hemoglobin prior to starting carboplatin gemcitabine chemotherapy was around 12.  Despite giving chemo at a reduced dose 2 weeks on 1 week off she has become progressively anemic which has led to worsening shortness of breath as well as supply demand ischemia.  It would be difficult to continue chemotherapy with her anemia.  Anemia work-up for reversible causes otherwise been negative.  I will therefore plan to switch her to lenvatinib and Keytruda at this time to give her a break from chemotherapy.  It appears that she was responding to the [place] gemcitabine regimen given that her CA-125 which was  previously elevated at 129 had normalized.  I will plan to start her on lenvatinib 14 mg daily and if she tolerates it well I will consider increasing it to 20 mg.  She will directly proceed for Keytruda next week and I will see her back in 4 weeks for cycle 2 of Keytruda.  Discussed risks and benefits of immunotherapy including all but not limited to autoimmune side effect such as thyroiditis colitis pneumonitis as well as need to monitor kidney and liver functions.  Lenvatinib can be associated with side effects such as fatigue, skin rash, diarrhea, hypertension and leg swelling.  Patient understands and agrees to proceed as planned.  Treatment will be given with a palliative intent.  Chemo-induced anemia: She will no longer be getting chemotherapy and we will continue to monitor her counts   Visit Diagnosis 1. Endometrial cancer ([place])   2. Peritoneal metastases ([place])   3. Goals of care, counseling/discussion   4. Antineoplastic chemotherapy induced anemia      Dr. Randa Evens, MD, MPH [facility] at [facility] [phone] 01/04/2020 12:10 PM

## 2020-01-04 NOTE — Progress Notes (Signed)
DISCONTINUE OFF PATHWAY REGIMEN - Uterine   [id number]:Liposomal Doxorubicin 50 mg/m2 q28d:   A cycle is every 28 days:     Liposomal doxorubicin   **Always confirm dose/schedule in your pharmacy ordering system**  REASON: Toxicities / Adverse Event PRIOR TREATMENT: Off Pathway: Liposomal Doxorubicin 50 mg/m2 q28d TREATMENT RESPONSE: Unable to Evaluate  START OFF PATHWAY REGIMEN - Uterine   [id number]:Lenvatinib 20 mg PO Daily D1-21 + Pembrolizumab 200 mg IV D1 q21 Days:   A cycle is every 21 days:     Lenvatinib      Pembrolizumab   **Always confirm dose/schedule in your pharmacy ordering system**  Patient Characteristics: Serous Carcinoma, Recurrent/Progressive Disease, Second Line, Relapse ? 6 Months From Prior Therapy, HER2 Negative/Unknown Histology: Serous Carcinoma Therapeutic Status: Recurrent or Progressive Disease Line of Therapy: Second Line Time to Recurrence: Relapse ? 6 Months From Prior Therapy HER2 Status: Negative Intent of Therapy: Non-Curative / Palliative Intent, Discussed with Patient

## 2020-01-04 NOTE — Progress Notes (Signed)
Pt had been in hospital for chest pain and swelling with legs, and had dizzy spell. She is now better. She is eating good on the week before coming back for chemo and the week she is on treatment she snacks but appetite dec. By 50%. No pain today . Bowels are working good now.

## 2020-01-05 ENCOUNTER — Telehealth: Payer: Self-pay | Admitting: Pharmacy Technician

## 2020-01-05 ENCOUNTER — Ambulatory Visit
Admission: RE | Admit: 2020-01-05 | Discharge: 2020-01-05 | Disposition: A | Discharge status: Home / Self Care | Payer: PPO | Source: Ambulatory Visit | Attending: Internal Medicine | Admitting: Internal Medicine

## 2020-01-05 DIAGNOSIS — Z1231 Encounter for screening mammogram for malignant neoplasm of breast: Secondary | ICD-10-CM | POA: Insufficient documentation

## 2020-01-05 NOTE — Telephone Encounter (Signed)
Oral Oncology Patient Advocate Encounter  Prior Authorization for Michel Santee has been approved.    PA# [id number] Effective dates: 01/05/20 through 01/04/21  Patients co-pay is $3156.93.  Will speak with patient about applying for manufacturer assistance since grant funding is closed.  Oral Oncology Clinic will continue to follow.   [place] Patient [place] Phone [phone] Fax [phone] 01/05/2020 10:27 AM

## 2020-01-05 NOTE — Telephone Encounter (Signed)
Oral Oncology Patient Advocate Encounter  Received notification from [employer] that prior authorization for Jaime Hall is required.  PA submitted on CoverMyMeds Key [id number] Status is pending  Oral Oncology Clinic will continue to follow.  [place] Patient [place] Phone [phone] Fax [phone] 01/05/2020 9:26 AM

## 2020-01-05 NOTE — Telephone Encounter (Signed)
Oral Oncology Patient Advocate Encounter  Met patient in Lobby to complete application for [employer]  Patient Assistance in an effort to reduce patient's out of pocket expense for Lenvima to $0.    Application completed and faxed to [phone].   [employer] patient assistance phone number for follow up is [phone].   This encounter will be updated until final determination.   [place] Patient [place] Phone [phone] Fax [phone] 01/05/2020 2:24 PM

## 2020-01-08 NOTE — Telephone Encounter (Signed)
Oral Oncology Patient Advocate Encounter  Received notification from [facility] Patient Assistance program that patient has been successfully enrolled into their program to receive Lenvima from the manufacturer at $0 out of pocket until 7/26/20200.    I called and spoke with patient.  She knows we will have to re-apply.   Specialty Pharmacy that will dispense medication is [facility].  Patient knows to call the office with questions or concerns.   Oral Oncology Clinic will continue to follow.  [place] Patient [place] Phone [phone] Fax [phone] 01/08/2020 4:15 PM

## 2020-01-10 NOTE — Telephone Encounter (Signed)
Oral Chemotherapy Pharmacist Encounter   Called Ms. Moomaw and she let me know that her Michel Santee is scheduled to be delivered on Friday 01/12/20. Will meet with Ms. Lizardi in infusion tomorrow 01/11/20 to provider medication education.  Darl Pikes, PharmD, BCPS, BCOP, CPP Hematology/Oncology Clinical Pharmacist [facility] [place] [facility] [phone]  01/10/2020 12:41 PM

## 2020-01-11 ENCOUNTER — Other Ambulatory Visit: Payer: Self-pay

## 2020-01-11 ENCOUNTER — Telehealth: Payer: Self-pay | Admitting: Pharmacist

## 2020-01-11 ENCOUNTER — Inpatient Hospital Stay: Payer: PPO

## 2020-01-11 VITALS — BP 108/39 | HR 78 | Temp 97.0°F | Resp 18 | Wt 161.2 lb

## 2020-01-11 DIAGNOSIS — C541 Malignant neoplasm of endometrium: Secondary | ICD-10-CM

## 2020-01-11 DIAGNOSIS — Z5112 Encounter for antineoplastic immunotherapy: Secondary | ICD-10-CM | POA: Diagnosis not present

## 2020-01-11 DIAGNOSIS — D6481 Anemia due to antineoplastic chemotherapy: Secondary | ICD-10-CM

## 2020-01-11 DIAGNOSIS — C786 Secondary malignant neoplasm of retroperitoneum and peritoneum: Secondary | ICD-10-CM

## 2020-01-11 LAB — CBC WITH DIFFERENTIAL/PLATELET
Abs Immature Granulocytes: 0.01 10*3/uL (ref 0.00–0.07)
Basophils Absolute: 0 10*3/uL (ref 0.0–0.1)
Basophils Relative: 0 %
Eosinophils Absolute: 0 10*3/uL (ref 0.0–0.5)
Eosinophils Relative: 1 %
HCT: 24.4 % — ABNORMAL LOW (ref 36.0–46.0)
Hemoglobin: 7.8 g/dL — ABNORMAL LOW (ref 12.0–15.0)
Immature Granulocytes: 0 %
Lymphocytes Relative: 12 %
Lymphs Abs: 0.5 10*3/uL — ABNORMAL LOW (ref 0.7–4.0)
MCH: 30.8 pg (ref 26.0–34.0)
MCHC: 32 g/dL (ref 30.0–36.0)
MCV: 96.4 fL (ref 80.0–100.0)
Monocytes Absolute: 0.6 10*3/uL (ref 0.1–1.0)
Monocytes Relative: 13 %
Neutro Abs: 3.4 10*3/uL (ref 1.7–7.7)
Neutrophils Relative %: 74 %
Platelets: 145 10*3/uL — ABNORMAL LOW (ref 150–400)
RBC: 2.53 MIL/uL — ABNORMAL LOW (ref 3.87–5.11)
RDW: 21.8 % — ABNORMAL HIGH (ref 11.5–15.5)
WBC: 4.5 10*3/uL (ref 4.0–10.5)
nRBC: 0 % (ref 0.0–0.2)

## 2020-01-11 LAB — TSH: TSH: 15.439 u[IU]/mL — ABNORMAL HIGH (ref 0.350–4.500)

## 2020-01-11 LAB — COMPREHENSIVE METABOLIC PANEL
ALT: 30 U/L (ref 0–44)
AST: 28 U/L (ref 15–41)
Albumin: 3.5 g/dL (ref 3.5–5.0)
Alkaline Phosphatase: 89 U/L (ref 38–126)
Anion gap: 7 (ref 5–15)
BUN: 20 mg/dL (ref 8–23)
CO2: 25 mmol/L (ref 22–32)
Calcium: 8.9 mg/dL (ref 8.9–10.3)
Chloride: 107 mmol/L (ref 98–111)
Creatinine, Ser: 1.26 mg/dL — ABNORMAL HIGH (ref 0.44–1.00)
GFR calc Af Amer: 50 mL/min — ABNORMAL LOW (ref >60)
GFR calc non Af Amer: 43 mL/min — ABNORMAL LOW (ref >60)
Glucose, Bld: 88 mg/dL (ref 70–99)
Potassium: 4.1 mmol/L (ref 3.5–5.1)
Sodium: 139 mmol/L (ref 135–145)
Total Bilirubin: 0.9 mg/dL (ref 0.3–1.2)
Total Protein: 6.1 g/dL — ABNORMAL LOW (ref 6.5–8.1)

## 2020-01-11 MED ORDER — HEPARIN SOD (PORK) LOCK FLUSH 100 UNIT/ML IV SOLN
500.0000 [IU] | Freq: Once | INTRAVENOUS | Status: DC | PRN
  Filled 2020-01-11: qty 5

## 2020-01-11 MED ORDER — SODIUM CHLORIDE 0.9 % IV SOLN
Freq: Once | INTRAVENOUS | Status: AC
  Filled 2020-01-11: qty 250

## 2020-01-11 MED ORDER — SODIUM CHLORIDE 0.9 % IV SOLN
200.0000 mg | Freq: Once | INTRAVENOUS | Status: AC
  Administered 2020-01-11: 200 mg via INTRAVENOUS
  Filled 2020-01-11: qty 8

## 2020-01-11 MED ORDER — SODIUM CHLORIDE 0.9% FLUSH
10.0000 mL | INTRAVENOUS | Status: DC | PRN
  Administered 2020-01-11: 10 mL via INTRAVENOUS
  Filled 2020-01-11: qty 10

## 2020-01-11 MED ORDER — HEPARIN SOD (PORK) LOCK FLUSH 100 UNIT/ML IV SOLN
500.0000 [IU] | Freq: Once | INTRAVENOUS | Status: AC
  Administered 2020-01-11: 500 [IU] via INTRAVENOUS
  Filled 2020-01-11: qty 5

## 2020-01-11 NOTE — Telephone Encounter (Signed)
Patient spoke with Nuala Alpha, Oral Chemo Pharmacist, and informed her that Michel Santee is scheduled to be delivered on 01/12/20.  [place] Patient [place] Phone [phone] Fax [phone] 01/11/2020 10:25 AM

## 2020-01-11 NOTE — Progress Notes (Signed)
Hemoglobin 7.8, per Dr. Rogue Bussing okay to proceed with Keytruda at this time, pt to report to clinic in one week for labs.   1104: Pt tolerated infusion well. No s/s of distress or reaction noted. Pt stable at discharge.

## 2020-01-11 NOTE — Telephone Encounter (Signed)
Oral Chemotherapy Pharmacist Encounter  Patient Education I spoke with patient during her infusion appointment for overview of new oral chemotherapy medication: Lenvima (lenvatinib) for the treatment of stage IV high-grade serous endometrial cancer in conjunction with pembrolizumab, planned duration until disease progression or unacceptable drug toxicity.   Pt is doing well. Counseled patient on administration, dosing, side effects, monitoring, drug-food interactions, safe handling, storage, and disposal. Patient will take 14mg  daily.  Side effects include but not limited to: HTN, diarrhea, fatigue, N/V, edema, hand-foot syndrome.    Reviewed with patient importance of keeping a medication schedule and plan for any missed doses.   Ms. Szumski voiced understanding and appreciation. All questions answered. Medication handout provided.  Provided patient with Oral [place] [facility] phone number. Patient knows to call the office with questions or concerns. Oral Chemotherapy Navigation Clinic will continue to follow.  Darl Pikes, PharmD, BCPS, BCOP, CPP Hematology/Oncology Clinical Pharmacist Practitioner [facility] [place] [facility] [phone]  01/11/2020 4:11 PM

## 2020-01-12 ENCOUNTER — Other Ambulatory Visit: Payer: Self-pay | Admitting: *Deleted

## 2020-01-12 DIAGNOSIS — R7989 Other specified abnormal findings of blood chemistry: Secondary | ICD-10-CM

## 2020-01-12 DIAGNOSIS — C541 Malignant neoplasm of endometrium: Secondary | ICD-10-CM

## 2020-01-13 ENCOUNTER — Encounter: Payer: Self-pay | Admitting: Oncology

## 2020-01-16 ENCOUNTER — Ambulatory Visit
Admission: RE | Admit: 2020-01-16 | Discharge: 2020-01-16 | Disposition: A | Discharge status: Home / Self Care | Payer: PPO | Source: Ambulatory Visit | Attending: Oncology | Admitting: Oncology

## 2020-01-16 ENCOUNTER — Other Ambulatory Visit: Payer: Self-pay

## 2020-01-16 ENCOUNTER — Inpatient Hospital Stay: Payer: PPO | Attending: Oncology

## 2020-01-16 DIAGNOSIS — R946 Abnormal results of thyroid function studies: Secondary | ICD-10-CM | POA: Insufficient documentation

## 2020-01-16 DIAGNOSIS — I700 Atherosclerosis of aorta: Secondary | ICD-10-CM | POA: Diagnosis not present

## 2020-01-16 DIAGNOSIS — Z79899 Other long term (current) drug therapy: Secondary | ICD-10-CM | POA: Diagnosis not present

## 2020-01-16 DIAGNOSIS — D649 Anemia, unspecified: Secondary | ICD-10-CM | POA: Insufficient documentation

## 2020-01-16 DIAGNOSIS — Z833 Family history of diabetes mellitus: Secondary | ICD-10-CM | POA: Insufficient documentation

## 2020-01-16 DIAGNOSIS — C541 Malignant neoplasm of endometrium: Secondary | ICD-10-CM | POA: Diagnosis not present

## 2020-01-16 DIAGNOSIS — T451X5A Adverse effect of antineoplastic and immunosuppressive drugs, initial encounter: Secondary | ICD-10-CM | POA: Diagnosis not present

## 2020-01-16 DIAGNOSIS — K760 Fatty (change of) liver, not elsewhere classified: Secondary | ICD-10-CM | POA: Insufficient documentation

## 2020-01-16 DIAGNOSIS — R079 Chest pain, unspecified: Secondary | ICD-10-CM | POA: Insufficient documentation

## 2020-01-16 DIAGNOSIS — I2510 Atherosclerotic heart disease of native coronary artery without angina pectoris: Secondary | ICD-10-CM | POA: Insufficient documentation

## 2020-01-16 DIAGNOSIS — I130 Hypertensive heart and chronic kidney disease with heart failure and stage 1 through stage 4 chronic kidney disease, or unspecified chronic kidney disease: Secondary | ICD-10-CM | POA: Diagnosis not present

## 2020-01-16 DIAGNOSIS — M1990 Unspecified osteoarthritis, unspecified site: Secondary | ICD-10-CM | POA: Diagnosis not present

## 2020-01-16 DIAGNOSIS — R000 Tachycardia, unspecified: Secondary | ICD-10-CM | POA: Diagnosis not present

## 2020-01-16 DIAGNOSIS — Z9049 Acquired absence of other specified parts of digestive tract: Secondary | ICD-10-CM | POA: Insufficient documentation

## 2020-01-16 DIAGNOSIS — Z882 Allergy status to sulfonamides status: Secondary | ICD-10-CM | POA: Insufficient documentation

## 2020-01-16 DIAGNOSIS — E785 Hyperlipidemia, unspecified: Secondary | ICD-10-CM | POA: Diagnosis not present

## 2020-01-16 DIAGNOSIS — R7989 Other specified abnormal findings of blood chemistry: Secondary | ICD-10-CM | POA: Diagnosis not present

## 2020-01-16 DIAGNOSIS — R0602 Shortness of breath: Secondary | ICD-10-CM | POA: Diagnosis not present

## 2020-01-16 DIAGNOSIS — N179 Acute kidney failure, unspecified: Secondary | ICD-10-CM | POA: Insufficient documentation

## 2020-01-16 DIAGNOSIS — Z5112 Encounter for antineoplastic immunotherapy: Secondary | ICD-10-CM | POA: Diagnosis not present

## 2020-01-16 DIAGNOSIS — Z8571 Personal history of Hodgkin lymphoma: Secondary | ICD-10-CM | POA: Insufficient documentation

## 2020-01-16 DIAGNOSIS — Z87891 Personal history of nicotine dependence: Secondary | ICD-10-CM | POA: Insufficient documentation

## 2020-01-16 DIAGNOSIS — G620 Drug-induced polyneuropathy: Secondary | ICD-10-CM | POA: Diagnosis not present

## 2020-01-16 DIAGNOSIS — K5730 Diverticulosis of large intestine without perforation or abscess without bleeding: Secondary | ICD-10-CM | POA: Diagnosis not present

## 2020-01-16 DIAGNOSIS — I509 Heart failure, unspecified: Secondary | ICD-10-CM | POA: Diagnosis not present

## 2020-01-16 DIAGNOSIS — Z881 Allergy status to other antibiotic agents status: Secondary | ICD-10-CM | POA: Insufficient documentation

## 2020-01-16 DIAGNOSIS — Z8042 Family history of malignant neoplasm of prostate: Secondary | ICD-10-CM | POA: Insufficient documentation

## 2020-01-16 DIAGNOSIS — R0609 Other forms of dyspnea: Secondary | ICD-10-CM | POA: Insufficient documentation

## 2020-01-16 DIAGNOSIS — C8590 Non-Hodgkin lymphoma, unspecified, unspecified site: Secondary | ICD-10-CM

## 2020-01-16 DIAGNOSIS — E063 Autoimmune thyroiditis: Secondary | ICD-10-CM | POA: Diagnosis not present

## 2020-01-16 DIAGNOSIS — Z90710 Acquired absence of both cervix and uterus: Secondary | ICD-10-CM | POA: Insufficient documentation

## 2020-01-16 DIAGNOSIS — J8410 Pulmonary fibrosis, unspecified: Secondary | ICD-10-CM | POA: Diagnosis not present

## 2020-01-16 DIAGNOSIS — D6481 Anemia due to antineoplastic chemotherapy: Secondary | ICD-10-CM

## 2020-01-16 DIAGNOSIS — Z888 Allergy status to other drugs, medicaments and biological substances status: Secondary | ICD-10-CM | POA: Insufficient documentation

## 2020-01-16 DIAGNOSIS — Z820 Family history of epilepsy and other diseases of the nervous system: Secondary | ICD-10-CM | POA: Insufficient documentation

## 2020-01-16 DIAGNOSIS — Z95828 Presence of other vascular implants and grafts: Secondary | ICD-10-CM

## 2020-01-16 DIAGNOSIS — G63 Polyneuropathy in diseases classified elsewhere: Secondary | ICD-10-CM

## 2020-01-16 DIAGNOSIS — Z90722 Acquired absence of ovaries, bilateral: Secondary | ICD-10-CM | POA: Insufficient documentation

## 2020-01-16 DIAGNOSIS — Z923 Personal history of irradiation: Secondary | ICD-10-CM | POA: Insufficient documentation

## 2020-01-16 DIAGNOSIS — N1831 Chronic kidney disease, stage 3a: Secondary | ICD-10-CM | POA: Diagnosis not present

## 2020-01-16 DIAGNOSIS — C786 Secondary malignant neoplasm of retroperitoneum and peritoneum: Secondary | ICD-10-CM | POA: Insufficient documentation

## 2020-01-16 DIAGNOSIS — Z800 Family history of malignant neoplasm of digestive organs: Secondary | ICD-10-CM | POA: Insufficient documentation

## 2020-01-16 DIAGNOSIS — Z823 Family history of stroke: Secondary | ICD-10-CM | POA: Insufficient documentation

## 2020-01-16 DIAGNOSIS — Z803 Family history of malignant neoplasm of breast: Secondary | ICD-10-CM | POA: Insufficient documentation

## 2020-01-16 DIAGNOSIS — Z807 Family history of other malignant neoplasms of lymphoid, hematopoietic and related tissues: Secondary | ICD-10-CM | POA: Insufficient documentation

## 2020-01-16 LAB — CBC
HCT: 29.1 % — ABNORMAL LOW (ref 36.0–46.0)
Hemoglobin: 9.7 g/dL — ABNORMAL LOW (ref 12.0–15.0)
MCH: 30.9 pg (ref 26.0–34.0)
MCHC: 33.3 g/dL (ref 30.0–36.0)
MCV: 92.7 fL (ref 80.0–100.0)
Platelets: 249 10*3/uL (ref 150–400)
RBC: 3.14 MIL/uL — ABNORMAL LOW (ref 3.87–5.11)
RDW: 20.9 % — ABNORMAL HIGH (ref 11.5–15.5)
WBC: 3.2 10*3/uL — ABNORMAL LOW (ref 4.0–10.5)
nRBC: 0 % (ref 0.0–0.2)

## 2020-01-16 LAB — TSH: TSH: 18.348 u[IU]/mL — ABNORMAL HIGH (ref 0.350–4.500)

## 2020-01-16 LAB — T4, FREE: Free T4: 0.89 ng/dL (ref 0.61–1.12)

## 2020-01-16 LAB — SAMPLE TO BLOOD BANK

## 2020-01-16 MED ORDER — SODIUM CHLORIDE 0.9% FLUSH
10.0000 mL | Freq: Once | INTRAVENOUS | Status: AC
  Administered 2020-01-16: 10 mL via INTRAVENOUS
  Filled 2020-01-16: qty 10

## 2020-01-16 MED ORDER — IOHEXOL 300 MG/ML  SOLN
75.0000 mL | Freq: Once | INTRAMUSCULAR | Status: AC | PRN
  Administered 2020-01-16: 75 mL via INTRAVENOUS

## 2020-01-16 MED ORDER — HEPARIN SOD (PORK) LOCK FLUSH 100 UNIT/ML IV SOLN
INTRAVENOUS | Status: AC
  Filled 2020-01-16: qty 5

## 2020-01-16 MED ORDER — HEPARIN SOD (PORK) LOCK FLUSH 100 UNIT/ML IV SOLN
500.0000 [IU] | Freq: Once | INTRAVENOUS | Status: AC
  Administered 2020-01-16: 500 [IU] via INTRAVENOUS
  Filled 2020-01-16: qty 5

## 2020-01-17 ENCOUNTER — Telehealth: Payer: Self-pay | Admitting: Internal Medicine

## 2020-01-17 LAB — T3, FREE: T3, Free: 2.7 pg/mL (ref 2.0–4.4)

## 2020-01-17 MED ORDER — LEVOTHYROXINE SODIUM 50 MCG PO TABS
50.0000 ug | ORAL_TABLET | Freq: Every day | ORAL | 3 refills | Status: DC
Start: 2020-01-17 — End: 2020-02-22

## 2020-01-17 NOTE — Telephone Encounter (Signed)
Spoke to patient regarding results of the high TSH; normal T3-T4-question symptomatic.  Recommend Synthroid 50 mcg [history of CHF]; recommend 1 tablet empty stomach in AM.

## 2020-01-18 ENCOUNTER — Other Ambulatory Visit: Payer: PPO

## 2020-01-18 ENCOUNTER — Ambulatory Visit: Payer: PPO

## 2020-01-18 ENCOUNTER — Ambulatory Visit: Payer: PPO | Admitting: Internal Medicine

## 2020-01-22 ENCOUNTER — Encounter: Payer: Self-pay | Admitting: Pharmacy Technician

## 2020-01-22 ENCOUNTER — Ambulatory Visit: Payer: PPO

## 2020-01-22 NOTE — Progress Notes (Signed)
Patient has been approved for drug assistance by [employer] for [employer]. The enrollment period is from 01/11/20-06/14/20 based on EOOP. First DOS covered is 01/11/20.

## 2020-01-25 ENCOUNTER — Ambulatory Visit: Payer: PPO

## 2020-01-25 ENCOUNTER — Other Ambulatory Visit: Payer: PPO

## 2020-01-26 DIAGNOSIS — E039 Hypothyroidism, unspecified: Secondary | ICD-10-CM | POA: Diagnosis not present

## 2020-01-26 DIAGNOSIS — I5033 Acute on chronic diastolic (congestive) heart failure: Secondary | ICD-10-CM | POA: Diagnosis not present

## 2020-01-26 DIAGNOSIS — Z743 Need for continuous supervision: Secondary | ICD-10-CM | POA: Diagnosis not present

## 2020-01-26 DIAGNOSIS — G459 Transient cerebral ischemic attack, unspecified: Secondary | ICD-10-CM | POA: Diagnosis not present

## 2020-01-26 DIAGNOSIS — I110 Hypertensive heart disease with heart failure: Secondary | ICD-10-CM | POA: Diagnosis not present

## 2020-01-26 DIAGNOSIS — I5023 Acute on chronic systolic (congestive) heart failure: Secondary | ICD-10-CM | POA: Diagnosis not present

## 2020-01-26 DIAGNOSIS — I340 Nonrheumatic mitral (valve) insufficiency: Secondary | ICD-10-CM | POA: Diagnosis not present

## 2020-01-26 DIAGNOSIS — F419 Anxiety disorder, unspecified: Secondary | ICD-10-CM | POA: Diagnosis not present

## 2020-01-26 DIAGNOSIS — T451X5A Adverse effect of antineoplastic and immunosuppressive drugs, initial encounter: Secondary | ICD-10-CM | POA: Diagnosis not present

## 2020-01-26 DIAGNOSIS — R0600 Dyspnea, unspecified: Secondary | ICD-10-CM | POA: Diagnosis not present

## 2020-01-26 DIAGNOSIS — C541 Malignant neoplasm of endometrium: Secondary | ICD-10-CM | POA: Diagnosis not present

## 2020-01-26 DIAGNOSIS — I2510 Atherosclerotic heart disease of native coronary artery without angina pectoris: Secondary | ICD-10-CM | POA: Diagnosis not present

## 2020-01-26 DIAGNOSIS — G620 Drug-induced polyneuropathy: Secondary | ICD-10-CM | POA: Diagnosis not present

## 2020-01-26 DIAGNOSIS — I427 Cardiomyopathy due to drug and external agent: Secondary | ICD-10-CM | POA: Diagnosis not present

## 2020-01-26 DIAGNOSIS — K219 Gastro-esophageal reflux disease without esophagitis: Secondary | ICD-10-CM | POA: Diagnosis not present

## 2020-01-26 DIAGNOSIS — Z20822 Contact with and (suspected) exposure to covid-19: Secondary | ICD-10-CM | POA: Diagnosis not present

## 2020-01-26 DIAGNOSIS — R0602 Shortness of breath: Secondary | ICD-10-CM | POA: Diagnosis not present

## 2020-01-26 DIAGNOSIS — Z955 Presence of coronary angioplasty implant and graft: Secondary | ICD-10-CM | POA: Diagnosis not present

## 2020-01-26 DIAGNOSIS — Z882 Allergy status to sulfonamides status: Secondary | ICD-10-CM | POA: Diagnosis not present

## 2020-01-26 DIAGNOSIS — Z8542 Personal history of malignant neoplasm of other parts of uterus: Secondary | ICD-10-CM | POA: Diagnosis not present

## 2020-01-26 DIAGNOSIS — E785 Hyperlipidemia, unspecified: Secondary | ICD-10-CM | POA: Diagnosis not present

## 2020-01-26 DIAGNOSIS — I509 Heart failure, unspecified: Secondary | ICD-10-CM | POA: Diagnosis not present

## 2020-01-26 DIAGNOSIS — G629 Polyneuropathy, unspecified: Secondary | ICD-10-CM | POA: Diagnosis not present

## 2020-01-28 ENCOUNTER — Encounter: Payer: Self-pay | Admitting: Oncology

## 2020-01-29 ENCOUNTER — Other Ambulatory Visit: Payer: PPO

## 2020-01-29 ENCOUNTER — Ambulatory Visit: Payer: PPO | Admitting: Oncology

## 2020-01-29 ENCOUNTER — Ambulatory Visit: Payer: PPO

## 2020-01-30 ENCOUNTER — Ambulatory Visit: Payer: PPO

## 2020-01-30 ENCOUNTER — Other Ambulatory Visit: Payer: PPO

## 2020-01-30 ENCOUNTER — Ambulatory Visit: Payer: PPO | Admitting: Oncology

## 2020-01-31 ENCOUNTER — Ambulatory Visit: Payer: PPO

## 2020-01-31 NOTE — Progress Notes (Signed)
Patient ID: GOLDA ZAVALZA, female    DOB: [birth year], 69 y.o.   MRN: [id number]  HPI  Ms Goracke is a 69 yr old female with a history of CAD, HTN, CKD, polycystic ovarian disease, anxiety, GERD, Hodgkin's lymphoma, endometrial cancer, previous tobacco use and chronic heart failure.   Echo report from 12/29/19 reviewed and showed an EF of 30-35% along with mild MR.   Catheterization done 12/04/19 showed:  There is moderate to severe left ventricular systolic dysfunction.  LV end diastolic pressure is moderately elevated.  Previously placed Prox LAD stent (unknown type) is widely patent.  Ost LAD to Prox LAD lesion is 40% stenosed.   1.  Patent LAD stent with no significant restenosis.  There is moderate stenosis in ostial LAD before the stent.  No evidence of obstructive disease overall. 2.  Moderately to severely reduced LV systolic function with an EF of 30 to 35%. 3.  Right heart catheterization showed mildly elevated filling pressures with pulmonary capillary wedge pressure of 18 mmHg, mild to moderate pulmonary hypertension at 44/19 with a mean of 33 mmHg, and mildly reduced cardiac output at 3.89 with a cardiac index of 2.24.  Pulmonary vascular resistance is 3.8 [place] units  Admitted 01/26/20 due to fast heart beat and rapid breathing at hospital in [facility]. Per patient, echo, CT and labs done and she was released. Admitted 12/29/19 due to chest pain that woke her from her sleep. Cardiology consult obtained and chest pain had resolved. Elevated troponin thought to be due to demand ischemia or transient coronary vasospasm. Discharged the following day.   She presents today for her initial visit with a chief complaint of moderate fatigue with little exertion. She describes this as chronic in nature having been present for several years although has worsened over the last few months. She has associated cough (worse when lying down), shortness of breath, palpitations,  dizziness (off-balance),  difficulty sleeping (chronic) and low blood pressure along with this. She denies any abdominal distention, pedal edema, chest pain or head congestion.   Recently had overnight admission at hospital in [facility] due to worsening shortness of breath. Says that she had an echo done there but does not have results with her. Continues to undergo chemo treatments for her cancer and is due to have lab work and another infusion today.   Past Medical History:  Diagnosis Date  . Anxiety   . Cervicalgia   . CHF (congestive heart failure) ([place])   . Chronic kidney disease   . Coronary artery disease    a. 02/2012 Stress echo: severe anterior wall ischemia;  b. 02/2012 Cath/PCI: LAD 95p (3.0 x 15 Xience EX DES), D1 90ost (PTCA - bifurcational dzs), EF 45% with anterior HK;  b. 02/2013 Ex MV: fixed anterior defect w/ minor reversibility, nl EF-->Med Rx.  . Endometrial cancer ([place])    a. 07/2016 s/p robotic hysterectomy, BSO w/ washings, sentinel node inj, mapping, bx, adhesiolysis.  . Essential hypertension, benign   . Fibrocystic breast disease   . GERD (gastroesophageal reflux disease)   . Gestational hypertension   . Heart murmur   . History of anemia   . History of blood transfusion   . Hodgkin's lymphoma ([place]) 2011   a. s/p radiation and chemo therapy  . Osteoarthritis   . Polycystic ovarian disease    Past Surgical History:  Procedure Laterality Date  . ABDOMINAL HYSTERECTOMY    . bladder sling    . CARDIAC CATHETERIZATION  02/2012   [place] 1 stent place  . CERVICAL POLYPECTOMY    . CHOLECYSTECTOMY  1982  . COLONOSCOPY WITH PROPOFOL N/A 02/05/2015   Procedure: COLONOSCOPY WITH PROPOFOL;  Surgeon: Lucilla Lame, MD;  Location: [facility] ENDOSCOPY;  Service: Endoscopy;  Laterality: N/A;  . CORONARY ANGIOPLASTY  02/2012   left/right s/p balloon  . CYSTOGRAM N/A 08/17/2016   Procedure: CYSTOGRAM;  Surgeon: Hollice Espy, MD;  Location: [facility] ORS;  Service: Urology;  Laterality: N/A;  . CYSTOSCOPY N/A  08/17/2016   Procedure: CYSTOSCOPY EXAM UNDER ANESTHESIA;  Surgeon: Hollice Espy, MD;  Location: [facility] ORS;  Service: Urology;  Laterality: N/A;  . CYSTOSCOPY W/ RETROGRADES Bilateral 08/17/2016   Procedure: CYSTOSCOPY WITH RETROGRADE PYELOGRAM;  Surgeon: Hollice Espy, MD;  Location: [facility] ORS;  Service: Urology;  Laterality: Bilateral;  . CYSTOSCOPY WITH STENT PLACEMENT Right 08/17/2016   Procedure: CYSTOSCOPY WITH STENT PLACEMENT;  Surgeon: Hollice Espy, MD;  Location: [facility] ORS;  Service: Urology;  Laterality: Right;  . heart stent'  2013  . kidney stent Right 2018  . LYMPH NODE BIOPSY  2011   diagnosis of hodgkins lymphoma  . PELVIC LYMPH NODE DISSECTION N/A 07/29/2016   Procedure: PELVIC/AORTIC LYMPH NODE SAMPLING;  Surgeon: [name], MD;  Location: [facility] ORS;  Service: Gynecology;  Laterality: N/A;  . PORTA CATH INSERTION N/A 09/22/2016   Procedure: Glori Luis Cath Insertion;  Surgeon: [name], MD;  Location: [place] CV LAB;  Service: Cardiovascular;  Laterality: N/A;  . PORTA CATH INSERTION N/A 10/27/2019   Procedure: PORTA CATH INSERTION;  Surgeon: [name], MD;  Location: [place] CV LAB;  Service: Cardiovascular;  Laterality: N/A;  . PORTA CATH REMOVAL N/A 11/17/2016   Procedure: Glori Luis Cath Removal;  Surgeon: [name], MD;  Location: [place] CV LAB;  Service: Cardiovascular;  Laterality: N/A;  . RIGHT/LEFT HEART CATH AND CORONARY ANGIOGRAPHY N/A 12/04/2019   Procedure: RIGHT/LEFT HEART CATH AND CORONARY ANGIOGRAPHY;  Surgeon: Wellington Hampshire, MD;  Location: [place] CV LAB;  Service: Cardiovascular;  Laterality: N/A;  . ROBOTIC ASSISTED TOTAL HYSTERECTOMY WITH BILATERAL SALPINGO OOPHERECTOMY N/A 07/29/2016   Procedure: ROBOTIC ASSISTED TOTAL HYSTERECTOMY WITH BILATERAL SALPINGO OOPHORECTOMY;  Surgeon: [name], MD;  Location: [facility] OR[facility];  Service: Gynecology;  Laterality: N/A;  . SENTINEL NODE BIOPSY N/A 07/29/2016    Procedure: SENTINEL NODE BIOPSY;  Surgeon: [name], MD;  Location: [facility] OR[facility];  Service: Gynecology;  Laterality: N/A;  . transobturator sling N/A 2009   [place]   Family History  Problem Relation Age of Onset  . ALS Father   . Polymyositis Father   . Diabetes Brother   . Cancer Maternal Aunt        breast  . Breast cancer Maternal Aunt        30's  . Stroke Maternal Grandmother   . Cancer Maternal Grandfather        prostate  . Stroke Maternal Grandfather   . Colon cancer Maternal Aunt   . Non-Hodgkin's lymphoma Cousin    Social History   Tobacco Use  . Smoking status: Former Smoker    Packs/day: 1.00    Years: 30.00    Pack years: 30.00    Types: Cigarettes    Quit date: 07/24/2002    Years since quitting: 17.5  . Smokeless tobacco: Never Used  . Tobacco comment: quit smoking in 2000  Substance Use Topics  . Alcohol use: Not Currently   Allergies  Allergen Reactions  .  Adhesive [Tape] Other (See Comments)    Burns the skin  . Z-Pak [Azithromycin] Itching  . Antifungal [Miconazole Nitrate] Rash  . Sulfa Antibiotics Nausea And Vomiting and Rash    N/T   Prior to Admission medications   Medication Sig Start Date End Date Taking? Authorizing Provider  aspirin EC 81 MG tablet Take 81 mg by mouth at bedtime.   Yes [provider]  atorvastatin (LIPITOR) 10 MG tablet Take 1 tablet by mouth once daily Patient taking differently: Take 10 mg by mouth every evening.  10/23/19  Yes Wellington Hampshire, MD  cholecalciferol (VITAMIN D3) 25 MCG (1000 UNIT) tablet Take 1,000 Units by mouth in the morning and at bedtime.   Yes [provider]  cyanocobalamin (,VITAMIN B-12,) 1000 MCG/ML injection INJECT 1ML IM WEEKLY FOR 4 WEEKS, THEN INJECT MONTHLY THEREAFTER Patient taking differently: Inject 1,000 mcg into the skin every 30 (thirty) days.  09/05/19  Yes [name], MD  DULoxetine (CYMBALTA) 30 MG capsule Take 1 capsule by mouth once  daily Patient taking differently: Take 30 mg by mouth daily.  10/22/19  Yes Sindy Guadeloupe, MD  furosemide (LASIX) 20 MG tablet Take 1 tablet (20 mg total) by mouth as needed for fluid or edema (Take one tablet daily as needed for lower extremity edema or for weight gain of 2 lbs overnight or 5 lbs in one week.). Patient taking differently: Take 20 mg by mouth daily.  12/20/19  Yes Loel Dubonnet, NP  lenvatinib 14 mg daily dose (LENVIMA) 10 & 4 MG capsule Take 14 mg by mouth daily. 01/12/20  Yes Sindy Guadeloupe, MD  levothyroxine (SYNTHROID) 50 MCG tablet Take 1 tablet (50 mcg total) by mouth daily before breakfast. 01/17/20  Yes Cammie Sickle, MD  metoprolol tartrate (LOPRESSOR) 25 MG tablet Take 12.5 mg by mouth 2 (two) times daily.   Yes [provider]  oxyCODONE (OXY IR/ROXICODONE) 5 MG immediate release tablet Take 1 tablet (5 mg total) by mouth every 6 (six) hours as needed for severe pain. 12/28/19  Yes Sindy Guadeloupe, MD  pantoprazole (PROTONIX) 20 MG tablet Take 1 tablet (20 mg total) by mouth daily. 12/20/19  Yes Jacquelin Hawking, NP  prochlorperazine (COMPAZINE) 10 MG tablet Take 1 tablet (10 mg total) by mouth every 6 (six) hours as needed (Nausea or vomiting). Patient taking differently: Take 10 mg by mouth daily as needed for nausea or vomiting.  10/25/19 01/04/20  Sindy Guadeloupe, MD    Review of Systems  Constitutional: Positive for fatigue (easily). Negative for appetite change.  HENT: Negative for congestion, postnasal drip and sore throat.   Eyes: Negative.   Respiratory: Positive for cough (when laying down or when short of breath) and shortness of breath (easily). Negative for chest tightness.   Cardiovascular: Positive for palpitations. Negative for chest pain and leg swelling.  Gastrointestinal: Negative for abdominal distention and abdominal pain.  Endocrine: Negative.   Genitourinary: Negative.   Musculoskeletal: Negative for back pain and neck pain.  Skin:  Negative.   Allergic/Immunologic: Negative.   Neurological: Positive for dizziness (at times). Negative for light-headedness.       Off balance  Hematological: Negative for adenopathy. Does not bruise/bleed easily.  Psychiatric/Behavioral: Positive for sleep disturbance (chronic difficulty; sleeping on 1 pillow). Negative for dysphoric mood. The patient is not nervous/anxious.     Vitals:   02/01/20 0855  BP: 114/64  Pulse: 84  Resp: 16  SpO2:  99%  Weight: 154 lb 4 oz (70 kg)  Height: 5\' 1"  (1.549 m)   Wt Readings from Last 3 Encounters:  02/01/20 154 lb 4 oz (70 kg)  01/11/20 161 lb 4 oz (73.1 kg)  01/04/20 160 lb 8 oz (72.8 kg)   Lab Results  Component Value Date   CREATININE 1.26 (H) 01/11/2020   CREATININE 1.12 (H) 01/04/2020   CREATININE 1.26 (H) 12/30/2019     Physical Exam Vitals and nursing note reviewed.  Constitutional:      Appearance: She is well-developed.  HENT:     Head: Normocephalic and atraumatic.  Neck:     Vascular: No JVD.  Cardiovascular:     Rate and Rhythm: Normal rate and regular rhythm.  Pulmonary:     Effort: Pulmonary effort is normal. No respiratory distress.     Breath sounds: No wheezing or rales.  Musculoskeletal:     Cervical back: Neck supple.     Right lower leg: No tenderness. No edema.     Left lower leg: No tenderness. No edema.  Skin:    General: Skin is warm and dry.  Neurological:     General: No focal deficit present.     Mental Status: She is alert and oriented to person, place, and time.  Psychiatric:        Mood and Affect: Mood normal.        Behavior: Behavior normal.     Assessment & Plan:  1: Chronic heart failure with reduced ejection fraction- - NYHA class III - euvolemic today - hasn't been weighing daily and she was instructed to resume weighing daily and calling for an overnight weight gain of >2 pounds or a weekly weight gain of >5 pounds - does add "some" salt but says that it's very little;  doesn't have much of an appetite anyways; reviewed the importance of not adding salt and written dietary information and low sodium cookbook were given to her - saw cardiology Gilford Rile) 12/13/19 - have requested [facility] from [facility] - BNP 12/29/19 was 898.1 - reports receiving both COVID vaccines - doubtful BP could tolerate entresto   2: HTN- - BP looks good although on the low side; home BP readings range from 95-116/ 65-72 - saw PCP Derrel Nip) 10/02/19 & returns October - BMP 01/11/20 reviewed and showed sodium 139, potassium 4.1, creatinine 1.26 and GFR 43  3: CKD- - labs getting done today at cancer center  4: Hodgkin lymphoma with subsequent endometrial cancer- - last chemo infusion was 01/16/20 - saw oncologist Janese Banks) 01/04/20   Patient did not bring her medications nor a list. Each medication was verbally reviewed with the patient and she was encouraged to bring the bottles to every visit to confirm accuracy of list.  Return in 3 months or sooner for any questions/problems before then.

## 2020-02-01 ENCOUNTER — Inpatient Hospital Stay: Payer: PPO | Admitting: Pharmacist

## 2020-02-01 ENCOUNTER — Inpatient Hospital Stay (HOSPITAL_BASED_OUTPATIENT_CLINIC_OR_DEPARTMENT_OTHER): Payer: PPO | Admitting: Oncology

## 2020-02-01 ENCOUNTER — Other Ambulatory Visit: Payer: Self-pay

## 2020-02-01 ENCOUNTER — Other Ambulatory Visit: Payer: Self-pay | Admitting: *Deleted

## 2020-02-01 ENCOUNTER — Encounter: Payer: Self-pay | Admitting: Family

## 2020-02-01 ENCOUNTER — Inpatient Hospital Stay: Payer: PPO

## 2020-02-01 ENCOUNTER — Ambulatory Visit: Payer: PPO | Attending: Family | Admitting: Family

## 2020-02-01 VITALS — BP 117/72 | HR 80 | Temp 96.6°F | Wt 154.5 lb

## 2020-02-01 VITALS — BP 114/64 | HR 84 | Resp 16 | Ht 61.0 in | Wt 154.2 lb

## 2020-02-01 DIAGNOSIS — Z7982 Long term (current) use of aspirin: Secondary | ICD-10-CM | POA: Diagnosis not present

## 2020-02-01 DIAGNOSIS — R0600 Dyspnea, unspecified: Secondary | ICD-10-CM

## 2020-02-01 DIAGNOSIS — E063 Autoimmune thyroiditis: Secondary | ICD-10-CM

## 2020-02-01 DIAGNOSIS — R002 Palpitations: Secondary | ICD-10-CM | POA: Insufficient documentation

## 2020-02-01 DIAGNOSIS — Z7901 Long term (current) use of anticoagulants: Secondary | ICD-10-CM | POA: Insufficient documentation

## 2020-02-01 DIAGNOSIS — C541 Malignant neoplasm of endometrium: Secondary | ICD-10-CM | POA: Diagnosis not present

## 2020-02-01 DIAGNOSIS — R42 Dizziness and giddiness: Secondary | ICD-10-CM | POA: Diagnosis not present

## 2020-02-01 DIAGNOSIS — Z8542 Personal history of malignant neoplasm of other parts of uterus: Secondary | ICD-10-CM | POA: Diagnosis not present

## 2020-02-01 DIAGNOSIS — C786 Secondary malignant neoplasm of retroperitoneum and peritoneum: Secondary | ICD-10-CM

## 2020-02-01 DIAGNOSIS — Z95828 Presence of other vascular implants and grafts: Secondary | ICD-10-CM

## 2020-02-01 DIAGNOSIS — I5022 Chronic systolic (congestive) heart failure: Secondary | ICD-10-CM | POA: Diagnosis not present

## 2020-02-01 DIAGNOSIS — K219 Gastro-esophageal reflux disease without esophagitis: Secondary | ICD-10-CM | POA: Insufficient documentation

## 2020-02-01 DIAGNOSIS — F419 Anxiety disorder, unspecified: Secondary | ICD-10-CM | POA: Insufficient documentation

## 2020-02-01 DIAGNOSIS — I130 Hypertensive heart and chronic kidney disease with heart failure and stage 1 through stage 4 chronic kidney disease, or unspecified chronic kidney disease: Secondary | ICD-10-CM | POA: Diagnosis not present

## 2020-02-01 DIAGNOSIS — N189 Chronic kidney disease, unspecified: Secondary | ICD-10-CM | POA: Insufficient documentation

## 2020-02-01 DIAGNOSIS — Z7189 Other specified counseling: Secondary | ICD-10-CM | POA: Diagnosis not present

## 2020-02-01 DIAGNOSIS — R0609 Other forms of dyspnea: Secondary | ICD-10-CM

## 2020-02-01 DIAGNOSIS — I272 Pulmonary hypertension, unspecified: Secondary | ICD-10-CM | POA: Diagnosis not present

## 2020-02-01 DIAGNOSIS — I10 Essential (primary) hypertension: Secondary | ICD-10-CM

## 2020-02-01 DIAGNOSIS — Z955 Presence of coronary angioplasty implant and graft: Secondary | ICD-10-CM | POA: Insufficient documentation

## 2020-02-01 DIAGNOSIS — R05 Cough: Secondary | ICD-10-CM | POA: Insufficient documentation

## 2020-02-01 DIAGNOSIS — M1990 Unspecified osteoarthritis, unspecified site: Secondary | ICD-10-CM | POA: Diagnosis not present

## 2020-02-01 DIAGNOSIS — Z79899 Other long term (current) drug therapy: Secondary | ICD-10-CM | POA: Diagnosis not present

## 2020-02-01 DIAGNOSIS — Z5112 Encounter for antineoplastic immunotherapy: Secondary | ICD-10-CM

## 2020-02-01 DIAGNOSIS — N1832 Chronic kidney disease, stage 3b: Secondary | ICD-10-CM

## 2020-02-01 DIAGNOSIS — C8190 Hodgkin lymphoma, unspecified, unspecified site: Secondary | ICD-10-CM | POA: Diagnosis not present

## 2020-02-01 DIAGNOSIS — Z87891 Personal history of nicotine dependence: Secondary | ICD-10-CM | POA: Insufficient documentation

## 2020-02-01 DIAGNOSIS — R0602 Shortness of breath: Secondary | ICD-10-CM | POA: Insufficient documentation

## 2020-02-01 DIAGNOSIS — I2510 Atherosclerotic heart disease of native coronary artery without angina pectoris: Secondary | ICD-10-CM | POA: Insufficient documentation

## 2020-02-01 LAB — COMPREHENSIVE METABOLIC PANEL
ALT: 15 U/L (ref 0–44)
AST: 29 U/L (ref 15–41)
Albumin: 3.7 g/dL (ref 3.5–5.0)
Alkaline Phosphatase: 80 U/L (ref 38–126)
Anion gap: 10 (ref 5–15)
BUN: 18 mg/dL (ref 8–23)
CO2: 28 mmol/L (ref 22–32)
Calcium: 8.4 mg/dL — ABNORMAL LOW (ref 8.9–10.3)
Chloride: 103 mmol/L (ref 98–111)
Creatinine, Ser: 1.54 mg/dL — ABNORMAL HIGH (ref 0.44–1.00)
GFR calc Af Amer: 39 mL/min — ABNORMAL LOW (ref >60)
GFR calc non Af Amer: 34 mL/min — ABNORMAL LOW (ref >60)
Glucose, Bld: 127 mg/dL — ABNORMAL HIGH (ref 70–99)
Potassium: 3.4 mmol/L — ABNORMAL LOW (ref 3.5–5.1)
Sodium: 141 mmol/L (ref 135–145)
Total Bilirubin: 1.8 mg/dL — ABNORMAL HIGH (ref 0.3–1.2)
Total Protein: 6.8 g/dL (ref 6.5–8.1)

## 2020-02-01 LAB — CBC WITH DIFFERENTIAL/PLATELET
Abs Immature Granulocytes: 0.02 10*3/uL (ref 0.00–0.07)
Basophils Absolute: 0 10*3/uL (ref 0.0–0.1)
Basophils Relative: 1 %
Eosinophils Absolute: 0.2 10*3/uL (ref 0.0–0.5)
Eosinophils Relative: 4 %
HCT: 35.1 % — ABNORMAL LOW (ref 36.0–46.0)
Hemoglobin: 11.9 g/dL — ABNORMAL LOW (ref 12.0–15.0)
Immature Granulocytes: 0 %
Lymphocytes Relative: 17 %
Lymphs Abs: 0.9 10*3/uL (ref 0.7–4.0)
MCH: 31.8 pg (ref 26.0–34.0)
MCHC: 33.9 g/dL (ref 30.0–36.0)
MCV: 93.9 fL (ref 80.0–100.0)
Monocytes Absolute: 0.4 10*3/uL (ref 0.1–1.0)
Monocytes Relative: 9 %
Neutro Abs: 3.4 10*3/uL (ref 1.7–7.7)
Neutrophils Relative %: 69 %
Platelets: 134 10*3/uL — ABNORMAL LOW (ref 150–400)
RBC: 3.74 MIL/uL — ABNORMAL LOW (ref 3.87–5.11)
RDW: 19.6 % — ABNORMAL HIGH (ref 11.5–15.5)
WBC: 5 10*3/uL (ref 4.0–10.5)
nRBC: 0 % (ref 0.0–0.2)

## 2020-02-01 MED ORDER — HEPARIN SOD (PORK) LOCK FLUSH 100 UNIT/ML IV SOLN
500.0000 [IU] | Freq: Once | INTRAVENOUS | Status: DC | PRN
  Filled 2020-02-01: qty 5

## 2020-02-01 MED ORDER — LENVIMA (10 MG DAILY DOSE) 10 MG PO CPPK: 10.0000 mg | ORAL_CAPSULE | Freq: Every day | ORAL | 1 refills | Status: DC

## 2020-02-01 MED ORDER — SODIUM CHLORIDE 0.9 % IV SOLN
200.0000 mg | Freq: Once | INTRAVENOUS | Status: AC
  Administered 2020-02-01: 200 mg via INTRAVENOUS
  Filled 2020-02-01: qty 8

## 2020-02-01 MED ORDER — SODIUM CHLORIDE 0.9% FLUSH
10.0000 mL | Freq: Once | INTRAVENOUS | Status: AC
  Administered 2020-02-01: 10 mL via INTRAVENOUS
  Filled 2020-02-01: qty 10

## 2020-02-01 MED ORDER — SODIUM CHLORIDE 0.9 % IV SOLN
Freq: Once | INTRAVENOUS | Status: AC
  Filled 2020-02-01: qty 250

## 2020-02-01 MED ORDER — LORAZEPAM 0.5 MG PO TABS: 0.5000 mg | ORAL_TABLET | Freq: Four times a day (QID) | ORAL | 0 refills | Status: AC | PRN

## 2020-02-01 MED ORDER — DULOXETINE HCL 30 MG PO CPEP: 30.0000 mg | ORAL_CAPSULE | Freq: Every day | ORAL | 2 refills | Status: DC

## 2020-02-01 MED ORDER — HEPARIN SOD (PORK) LOCK FLUSH 100 UNIT/ML IV SOLN
500.0000 [IU] | Freq: Once | INTRAVENOUS | Status: AC
  Administered 2020-02-01: 500 [IU] via INTRAVENOUS
  Filled 2020-02-01: qty 5

## 2020-02-01 NOTE — Progress Notes (Signed)
Cr 1.54, Bili 1.8.  Ok to proceed with tx per MD

## 2020-02-01 NOTE — Progress Notes (Signed)
[facility]  [phone] [phone]  Patient Care Team: [name], MD as PCP - General (Internal Medicine) Wellington Hampshire, MD as PCP - Cardiology (Cardiology) [name], MD as Consulting Physician (General Surgery) Mellody Drown, MD as Referring Physician (Obstetrics and Gynecology) [name], MD as Referring Physician (Obstetrics and Gynecology) Hollice Espy, MD as Consulting Physician (Urology) [name], RN as Registered Nurse Sindy Guadeloupe, MD as Consulting Physician (Oncology)   Name of the patient: Jaime Hall  [id number]  05-May-1951   Date of visit: 02/01/20  HPI: Patient is a 69 y.o. female with stage IV high-grade serous endometrial cancer. She was started on treatment with pembrolizumab and Lenvima (lenvatinib). Pembrolizumab started 01/11/20 and lenvatinib started 01/12/20.  Reason for Consult: Oral chemotherapy follow-up for lenvatinib therapy.   PAST MEDICAL HISTORY: Past Medical History:  Diagnosis Date  . Anxiety   . Cervicalgia   . Coronary artery disease    a. 02/2012 Stress echo: severe anterior wall ischemia;  b. 02/2012 Cath/PCI: LAD 95p (3.0 x 15 Xience EX DES), D1 90ost (PTCA - bifurcational dzs), EF 45% with anterior HK;  b. 02/2013 Ex MV: fixed anterior defect w/ minor reversibility, nl EF-->Med Rx.  . Endometrial cancer ([place])    a. 07/2016 s/p robotic hysterectomy, BSO w/ washings, sentinel node inj, mapping, bx, adhesiolysis.  . Essential hypertension, benign   . Fibrocystic breast disease   . GERD (gastroesophageal reflux disease)   . Gestational hypertension   . Heart murmur   . History of anemia   . History of blood transfusion   . Hodgkin's lymphoma ([place]) 2011   a. s/p radiation and chemo therapy  . Osteoarthritis   . Polycystic ovarian disease     PAST SURGICAL HISTORY:  Past Surgical History:  Procedure Laterality Date    . ABDOMINAL HYSTERECTOMY    . bladder sling    . CARDIAC CATHETERIZATION  02/2012   [facility] 1 stent place  . CERVICAL POLYPECTOMY    . CHOLECYSTECTOMY  1982  . COLONOSCOPY WITH PROPOFOL N/A 02/05/2015   Procedure: COLONOSCOPY WITH PROPOFOL;  Surgeon: Lucilla Lame, MD;  Location: [facility] END[facility]COPY;  Service: Endoscopy;  Laterality: N/A;  . CORONARY ANGIOPLASTY  02/2012   left/right s/p balloon  . CYSTOGRAM N/A 08/17/2016   Procedure: CYSTOGRAM;  Surgeon: Hollice Espy, MD;  Location: [facility] ORS;  Service: Urology;  Laterality: N/A;  . CYSTOSCOPY N/A 08/17/2016   Procedure: CYSTOSCOPY EXAM UNDER ANESTHESIA;  Surgeon: Hollice Espy, MD;  Location: [facility] OR[facility];  Service: Urology;  Laterality: N/A;  . CYSTOSCOPY W/ RETROGRADES Bilateral 08/17/2016   Procedure: CYSTOSCOPY WITH RETROGRADE PYELOGRAM;  Surgeon: Hollice Espy, MD;  Location: [facility] OR[facility];  Service: Urology;  Laterality: Bilateral;  . CYSTOSCOPY WITH STENT PLACEMENT Right 08/17/2016   Procedure: CYSTOSCOPY WITH STENT PLACEMENT;  Surgeon: Hollice Espy, MD;  Location: [facility] OR[facility];  Service: Urology;  Laterality: Right;  . heart stent'  2013  . kidney stent Right 2018  . LYMPH NODE BIOPSY  2011   diagnosis of hodgkins lymphoma  . PELVIC LYMPH NODE DISSECTION N/A 07/29/2016   Procedure: PELVIC/AORTIC LYMPH NODE SAMPLING;  Surgeon: [name], MD;  Location: [facility] OR[facility];  Service: Gynecology;  Laterality: N/A;  . PORTA CATH INSERTION N/A 09/22/2016   Procedure: Glori Luis Cath Insertion;  Surgeon: [name], MD;  Location: [place] [facility];  Service: Cardiovascular;  Laterality: N/A;  . PORTA CATH INSERTION N/A 10/27/2019  Procedure: PORTA CATH INSERTION;  Surgeon: [name], MD;  Location: [place] [facility];  Service: Cardiovascular;  Laterality: N/A;  . PORTA CATH REMOVAL N/A 11/17/2016   Procedure: Glori Luis Cath Removal;  Surgeon: [name], MD;  Location: [place] [facility];  Service: Cardiovascular;  Laterality: N/A;  .  RIGHT/LEFT HEART CATH AND CORONARY ANGIOGRAPHY N/A 12/04/2019   Procedure: RIGHT/LEFT HEART CATH AND CORONARY ANGIOGRAPHY;  Surgeon: Wellington Hampshire, MD;  Location: [place] [facility];  Service: Cardiovascular;  Laterality: N/A;  . ROBOTIC ASSISTED TOTAL HYSTERECTOMY WITH BILATERAL SALPINGO OOPHERECTOMY N/A 07/29/2016   Procedure: ROBOTIC ASSISTED TOTAL HYSTERECTOMY WITH BILATERAL SALPINGO OOPHORECTOMY;  Surgeon: [name], MD;  Location: [facility];  Service: Gynecology;  Laterality: N/A;  . SENTINEL NODE BIOPSY N/A 07/29/2016   Procedure: SENTINEL NODE BIOPSY;  Surgeon: [name], MD;  Location: [facility];  Service: Gynecology;  Laterality: N/A;  . transobturator sling N/A 2009   [place]    HEMATOLOGY/ONCOLOGY HISTORY:  Oncology History  Endometrial cancer ([place])  08/13/2016 Initial Diagnosis   Endometrial cancer ([place])   11/06/2019 - 12/28/2019 Chemotherapy   The patient had palonosetron (ALOXI) injection 0.25 mg, 0.25 mg, Intravenous,  Once, 3 of 4 cycles Administration: 0.25 mg (11/14/2019), 0.25 mg (12/07/2019), 0.25 mg (12/14/2019), 0.25 mg (12/28/2019) pegfilgrastim (NEULASTA ONPRO KIT) injection 6 mg, 6 mg, Subcutaneous, Once, 3 of 4 cycles Administration: 6 mg (11/14/2019), 6 mg (12/14/2019) CARBOplatin (PARAPLATIN) 140 mg in sodium chloride 0.9 % 100 mL chemo infusion, 140 mg (100 % of original dose 141 mg), Intravenous,  Once, 3 of 4 cycles Dose modification:   (original dose 141 mg, Cycle 1) Administration: 140 mg (11/06/2019), 110 mg (12/07/2019), 150 mg (11/14/2019), 110 mg (12/14/2019), 110 mg (12/28/2019) gemcitabine (GEMZAR) 1,400 mg in sodium chloride 0.9 % 250 mL chemo infusion, 1,444 mg (100 % of original dose 800 mg/m2), Intravenous,  Once, 3 of 4 cycles Dose modification: 800 mg/m2 (original dose 800 mg/m2, Cycle 1, Reason: Patient Age) Administration: 1,400 mg (11/06/2019), 1,400 mg (11/14/2019), 1,400 mg (12/07/2019), 1,400 mg (12/14/2019), 1,400 mg (12/28/2019)  for  chemotherapy treatment.    01/11/2020 -  Chemotherapy   The patient had lenvatinib 14 mg daily dose (LENVIMA, 14 MG DAILY DOSE,) 10 & 4 MG capsule, 14 mg, Oral, Daily, 1 of 1 cycle, Start date: --, End date: -- pembrolizumab (KEYTRUDA) 200 mg in sodium chloride 0.9 % 50 mL chemo infusion, 200 mg, Intravenous, Once, 1 of 6 cycles Administration: 200 mg (01/11/2020)  for chemotherapy treatment.    Peritoneal metastases ([place])  10/16/2019 Initial Diagnosis   Peritoneal metastases ([place])   11/06/2019 - 12/28/2019 Chemotherapy   The patient had palonosetron (ALOXI) injection 0.25 mg, 0.25 mg, Intravenous,  Once, 3 of 4 cycles Administration: 0.25 mg (11/14/2019), 0.25 mg (12/07/2019), 0.25 mg (12/14/2019), 0.25 mg (12/28/2019) pegfilgrastim (NEULASTA ONPRO KIT) injection 6 mg, 6 mg, Subcutaneous, Once, 3 of 4 cycles Administration: 6 mg (11/14/2019), 6 mg (12/14/2019) CARBOplatin (PARAPLATIN) 140 mg in sodium chloride 0.9 % 100 mL chemo infusion, 140 mg (100 % of original dose 141 mg), Intravenous,  Once, 3 of 4 cycles Dose modification:   (original dose 141 mg, Cycle 1) Administration: 140 mg (11/06/2019), 110 mg (12/07/2019), 150 mg (11/14/2019), 110 mg (12/14/2019), 110 mg (12/28/2019) gemcitabine (GEMZAR) 1,400 mg in sodium chloride 0.9 % 250 mL chemo infusion, 1,444 mg (100 % of original dose 800 mg/m2), Intravenous,  Once, 3 of 4 cycles Dose modification: 800 mg/m2 (  original dose 800 mg/m2, Cycle 1, Reason: Patient Age) Administration: 1,400 mg (11/06/2019), 1,400 mg (11/14/2019), 1,400 mg (12/07/2019), 1,400 mg (12/14/2019), 1,400 mg (12/28/2019)  for chemotherapy treatment.    01/11/2020 -  Chemotherapy   The patient had lenvatinib 14 mg daily dose (LENVIMA, 14 MG DAILY DOSE,) 10 & 4 MG capsule, 14 mg, Oral, Daily, 1 of 1 cycle, Start date: --, End date: -- pembrolizumab (KEYTRUDA) 200 mg in sodium chloride 0.9 % 50 mL chemo infusion, 200 mg, Intravenous, Once, 1 of 6 cycles Administration: 200 mg (01/11/2020)  for  chemotherapy treatment.      ALLERGIES:  is allergic to adhesive [tape], z-pak [azithromycin], antifungal [miconazole nitrate], and sulfa antibiotics.  MEDICATIONS:  Current Outpatient Medications  Medication Sig Dispense Refill  . aspirin EC 81 MG tablet Take 81 mg by mouth at bedtime.    Marland Kitchen atorvastatin (LIPITOR) 10 MG tablet Take 1 tablet by mouth once daily (Patient taking differently: Take 10 mg by mouth every evening. ) 90 tablet 2  . cholecalciferol (VITAMIN D3) 25 MCG (1000 UNIT) tablet Take 1,000 Units by mouth in the morning and at bedtime.    . cyanocobalamin (,VITAMIN B-12,) 1000 MCG/ML injection INJECT 1ML IM WEEKLY FOR 4 WEEKS, THEN INJECT MONTHLY THEREAFTER (Patient taking differently: Inject 1,000 mcg into the skin every 30 (thirty) days. ) 10 mL 0  . DULoxetine (CYMBALTA) 30 MG capsule Take 1 capsule by mouth once daily (Patient taking differently: Take 30 mg by mouth daily. ) 90 capsule 0  . furosemide (LASIX) 20 MG tablet Take 1 tablet (20 mg total) by mouth as needed for fluid or edema (Take one tablet daily as needed for lower extremity edema or for weight gain of 2 lbs overnight or 5 lbs in one week.). (Patient taking differently: Take 20 mg by mouth daily. ) 30 tablet 2  . lenvatinib 14 mg daily dose (LENVIMA) 10 & 4 MG capsule Take 14 mg by mouth daily.    Marland Kitchen levothyroxine (SYNTHROID) 50 MCG tablet Take 1 tablet (50 mcg total) by mouth daily before breakfast. 30 tablet 3  . metoprolol tartrate (LOPRESSOR) 25 MG tablet Take 12.5 mg by mouth 2 (two) times daily.    Marland Kitchen oxyCODONE (OXY IR/ROXICODONE) 5 MG immediate release tablet Take 1 tablet (5 mg total) by mouth every 6 (six) hours as needed for severe pain. 30 tablet 0  . pantoprazole (PROTONIX) 20 MG tablet Take 1 tablet (20 mg total) by mouth daily. 60 tablet 1   No current facility-administered medications for this visit.   Facility-Administered Medications Ordered in Other Visits  Medication Dose Route Frequency  Provider Last Rate Last Admin  . heparin lock flush 100 unit/mL  500 Units Intravenous Once Sindy Guadeloupe, MD      . sodium chloride flush (NS) 0.9 % injection 10 mL  10 mL Intravenous PRN Sindy Guadeloupe, MD   10 mL at 12/07/19 0914  . sodium chloride flush (NS) 0.9 % injection 10 mL  10 mL Intravenous PRN Sindy Guadeloupe, MD   10 mL at 01/04/20 0850    VITAL SIGNS: There were no vitals taken for this visit. There were no vitals filed for this visit.  Estimated body mass index is 29.15 kg/m as calculated from the following:   Height as of an earlier encounter on 02/01/20: '5\' 1"'  (1.549 m).   Weight as of an earlier encounter on 02/01/20: 70 kg (154 lb 4 oz).  LABS: CBC:  Component Value Date/Time   WBC 3.2 (L) 01/16/2020 0902   HGB 9.7 (L) 01/16/2020 0902   HGB 13.4 03/27/2014 1214   HCT 29.1 (L) 01/16/2020 0902   HCT 41.8 03/27/2014 1214   PLT 249 01/16/2020 0902   PLT 220 03/27/2014 1214   MCV 92.7 01/16/2020 0902   MCV 92 03/27/2014 1214   NEUTROABS 3.4 01/11/2020 0823   NEUTROABS 3,274 02/15/2019 0000   NEUTROABS 4.9 03/27/2014 1214   LYMPHSABS 0.5 (L) 01/11/2020 0823   LYMPHSABS 1.4 03/27/2014 1214   MONOABS 0.6 01/11/2020 0823   MONOABS 0.6 03/27/2014 1214   EOSABS 0.0 01/11/2020 0823   EOSABS 0.1 03/27/2014 1214   BASOSABS 0.0 01/11/2020 0823   BASOSABS 0.1 03/27/2014 1214   Comprehensive Metabolic Panel:    Component Value Date/Time   NA 139 01/11/2020 0823   NA 140 02/15/2019 0000   NA 142 03/27/2014 1214   K 4.1 01/11/2020 0823   K 4.6 03/27/2014 1214   CL 107 01/11/2020 0823   CL 107 03/27/2014 1214   CO2 25 01/11/2020 0823   CO2 33 (H) 03/27/2014 1214   BUN 20 01/11/2020 0823   BUN 19 02/15/2019 0000   BUN 14 03/27/2014 1214   CREATININE 1.26 (H) 01/11/2020 0823   CREATININE 1.04 03/27/2014 1214   GLUCOSE 88 01/11/2020 0823   GLUCOSE 77 03/27/2014 1214   CALCIUM 8.9 01/11/2020 0823   CALCIUM 8.7 03/27/2014 1214   AST 28 01/11/2020 0823   AST 17  03/27/2014 1214   ALT 30 01/11/2020 0823   ALT 22 03/27/2014 1214   ALKPHOS 89 01/11/2020 0823   ALKPHOS 81 03/27/2014 1214   BILITOT 0.9 01/11/2020 0823   BILITOT 1.1 (H) 03/27/2014 1214   PROT 6.1 (L) 01/11/2020 0823   PROT 6.9 03/27/2014 1214   ALBUMIN 3.5 01/11/2020 0823   ALBUMIN 3.6 03/27/2014 1214    RADIOGRAPHIC STUDIES: CT Chest W Contrast  Result Date: 01/16/2020 CLINICAL DATA:  Restaging recurrent endometrial cancer, peritoneal carcinomatosis, assess treatment response, chemotherapy, additional remote history of Hodgkin's lymphoma EXAM: CT CHEST, ABDOMEN, AND PELVIS WITH CONTRAST TECHNIQUE: Multidetector CT imaging of the chest, abdomen and pelvis was performed following the standard protocol during bolus administration of intravenous contrast. CONTRAST:  69m OMNIPAQUE IOHEXOL 300 MG/ML SOLN, additional oral enteric contrast COMPARISON:  CT chest angiogram, 12/29/2019, CT abdomen pelvis, 10/16/2019 FINDINGS: CT CHEST FINDINGS Cardiovascular: Right chest port catheter. Aortic atherosclerosis. Normal heart size. LAD stent. No pericardial effusion. Mediastinum/Nodes: No enlarged mediastinal, hilar, or axillary lymph nodes. Thyroid gland, trachea, and esophagus demonstrate no significant findings. 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 No pleural effusion or pneumothorax. Musculoskeletal: No chest wall mass or suspicious bone lesions identified. CT ABDOMEN PELVIS FINDINGS Hepatobiliary: No focal liver abnormality is seen. Hepatic steatosis. Status post cholecystectomy. Postoperative biliary dilatation. Pancreas: Unremarkable. No pancreatic ductal dilatation or surrounding inflammatory changes. Spleen: Normal in size without significant abnormality. Adrenals/Urinary Tract: Adrenal glands are unremarkable. Kidneys are normal, without renal calculi, solid lesion, or hydronephrosis. Bladder is unremarkable. Stomach/Bowel: Stomach is within normal  limits. Appendix is very diminutive. No evidence of bowel wall thickening, distention, or inflammatory changes. Sigmoid diverticulosis. Vascular/Lymphatic: Aortic atherosclerosis. No enlarged abdominal or pelvic lymph nodes. Reproductive: Status post hysterectomy. Previously seen soft tissue thickening at the right aspect of the low right hemipelvis is essentially resolved (series 2, image 98). Other: No abdominal wall hernia or abnormality. Significant interval reduction in size of multiple peritoneal or omental nodules in the  anterior right hemiabdomen, the largest nodule measuring 3.6 x 2.7 cm, previously 5.7 x 4.7 cm when measured similarly (series 2, image 74). There is however a new peritoneal or omental nodule in the anterior left hemiabdomen measuring 1.0 x 0.8 cm (series 2, image 84). Musculoskeletal: No acute or significant osseous findings. Redemonstrated sclerotic bilateral sacral alar insufficiency fractures. IMPRESSION: 1. Significant interval reduction in size of multiple peritoneal or omental nodules in the anterior right hemiabdomen as well as soft tissue in the low right hemipelvis, consistent with treatment response. 2. There is however a new peritoneal or omental nodule in the anterior left hemiabdomen, concerning for mixed response to treatment. 3. Status post hysterectomy. 4. No evidence of metastatic disease in the chest. 5. 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 6. Hepatic steatosis. 7. Coronary artery disease.  Aortic Atherosclerosis (ICD10-I70.0). Electronically Signed   By: Eddie Candle M.D.   On: 01/16/2020 16:01   CT Abdomen Pelvis W Contrast  Result Date: 01/16/2020 CLINICAL DATA:  Restaging recurrent endometrial cancer, peritoneal carcinomatosis, assess treatment response, chemotherapy, additional remote history of Hodgkin's lymphoma EXAM: CT CHEST, ABDOMEN, AND PELVIS  WITH CONTRAST TECHNIQUE: Multidetector CT imaging of the chest, abdomen and pelvis was performed following the standard protocol during bolus administration of intravenous contrast. CONTRAST:  64m OMNIPAQUE IOHEXOL 300 MG/ML SOLN, additional oral enteric contrast COMPARISON:  CT chest angiogram, 12/29/2019, CT abdomen pelvis, 10/16/2019 FINDINGS: CT CHEST FINDINGS Cardiovascular: Right chest port catheter. Aortic atherosclerosis. Normal heart size. LAD stent. No pericardial effusion. Mediastinum/Nodes: No enlarged mediastinal, hilar, or axillary lymph nodes. Thyroid gland, trachea, and esophagus demonstrate no significant findings. 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 No pleural effusion or pneumothorax. Musculoskeletal: No chest wall mass or suspicious bone lesions identified. CT ABDOMEN PELVIS FINDINGS Hepatobiliary: No focal liver abnormality is seen. Hepatic steatosis. Status post cholecystectomy. Postoperative biliary dilatation. Pancreas: Unremarkable. No pancreatic ductal dilatation or surrounding inflammatory changes. Spleen: Normal in size without significant abnormality. Adrenals/Urinary Tract: Adrenal glands are unremarkable. Kidneys are normal, without renal calculi, solid lesion, or hydronephrosis. Bladder is unremarkable. Stomach/Bowel: Stomach is within normal limits. Appendix is very diminutive. No evidence of bowel wall thickening, distention, or inflammatory changes. Sigmoid diverticulosis. Vascular/Lymphatic: Aortic atherosclerosis. No enlarged abdominal or pelvic lymph nodes. Reproductive: Status post hysterectomy. Previously seen soft tissue thickening at the right aspect of the low right hemipelvis is essentially resolved (series 2, image 98). Other: No abdominal wall hernia or abnormality. Significant interval reduction in size of multiple peritoneal or omental nodules in the anterior right hemiabdomen, the largest nodule measuring 3.6 x 2.7 cm,  previously 5.7 x 4.7 cm when measured similarly (series 2, image 74). There is however a new peritoneal or omental nodule in the anterior left hemiabdomen measuring 1.0 x 0.8 cm (series 2, image 84). Musculoskeletal: No acute or significant osseous findings. Redemonstrated sclerotic bilateral sacral alar insufficiency fractures. IMPRESSION: 1. Significant interval reduction in size of multiple peritoneal or omental nodules in the anterior right hemiabdomen as well as soft tissue in the low right hemipelvis, consistent with treatment response. 2. There is however a new peritoneal or omental nodule in the anterior left hemiabdomen, concerning for mixed response to treatment. 3. Status post hysterectomy. 4. No evidence of metastatic disease in the chest. 5. 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 6. Hepatic steatosis. 7. Coronary artery disease.  Aortic Atherosclerosis (ICD10-I70.0). Electronically Signed   By: Eddie Candle M.D.   On: 01/16/2020 16:01   MM 3D SCREEN BREAST BILATERAL  Result Date: 01/08/2020 CLINICAL DATA:  Screening. EXAM: DIGITAL SCREENING BILATERAL MAMMOGRAM WITH TOMO AND CAD COMPARISON:  Previous exam(s). ACR Breast Density Category b: There are scattered areas of fibroglandular density. FINDINGS: There are no findings suspicious for malignancy. Images were processed with CAD. IMPRESSION: No mammographic evidence of malignancy. A result letter of this screening mammogram will be mailed directly to the patient. RECOMMENDATION: Screening mammogram in one year. (Code:[id number]) BI-RADS CATEGORY  1: Negative. Electronically Signed   By: Lillia Mountain M.D.   On: 01/08/2020 11:24    Assessment and Plan-  Plan to dose decrease the lenvatinib to 23m daily. Patient will use the 118mcapsules at of the 1467mose packet she has at home to make the dose change. Prescription for 4m71mse packet  [facility] Side Effect/Intolerance:  -Patient reported fatigue, shortness of breath, and an increased in the heart rate a few days after starting her lenvatinib. She reported a overall decrease in her energy level. She is returning to work tomorrow and is worried the fatigue may affect her ability to work. -HTN: patient keeps a blood pressure log at home and noticed that her blood pressure usually runs low but has been more in the normal range while on the lenvatinib. -Hand-foot syndrome: she is trying to keep her hand and feet moisturized but she has noticed 2 areas of sores on her feet -Nausea: She does not have frequent nausea but she did report 2 events of nausea which she was able to manage with on hand prn medications  -Plan: Dose reduce to help with the above side effects  Oral Chemotherapy Adherence: no missed doses reported or barriers to adherence  Medication Access Issues: Patient [facility] with [facility]) she know to give them a call tomorrow to set-up her medication delivery.  Patient expressed understanding and was in agreement with this plan. She also understands that She can call clinic at any time with any questions, concerns, or complaints.   New medications? Levothyroxine, no DDI with lenvatinib  Thank you for allowing me to participate in the care of this very pleasant patient.   Time Total: 10 mins  Visit consisted of counseling and education on dealing with issues of symptom management in the setting of serious and potentially life-threatening illness.Greater than 50%  of this time was spent counseling and coordinating care related to the above assessment and plan.  Signed by: AlysDarl PikesarmD, BCPS, BCOPSalley SlaughterP Hematology/Oncology Clinical Pharmacist Practitioner [facility]-[phone]/2021 9:22  AM

## 2020-02-01 NOTE — Progress Notes (Signed)
1150: Creatinine 1.54, Bilirubin 1.8. Per Dr. Janese Banks proceed with scheduled [facility] treatment.

## 2020-02-01 NOTE — Patient Instructions (Addendum)
Resume weighing daily and call for an overnight weight gain of > 2 pounds or a weekly weight gain of >5 pounds. 

## 2020-02-01 NOTE — Progress Notes (Addendum)
Hematology/Oncology Consult note [facility]  [phone] [phone]  Patient Care Team: [name], MD as PCP - General (Internal Medicine) Wellington Hampshire, MD as PCP - Cardiology (Cardiology) [name], MD as Consulting Physician (General Surgery) Mellody Drown, MD as Referring Physician (Obstetrics and Gynecology) [name], MD as Referring Physician (Obstetrics and Gynecology) Hollice Espy, MD as Consulting Physician (Urology) [name], RN as Registered Nurse Sindy Guadeloupe, MD as Consulting Physician (Oncology)   Name of the patient: Jaime Hall  [id number]  Nov 29, 1950   Date of visit: 02/01/20  Diagnosis-  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 She underwent robotic hysterectomy with bilateral salpingo-oophorectomy with washings and sentinel lymph node injection mapping and biopsy in February 2018. She had multiple postoperative complications and completed 3 cycles of carbotaxol chemotherapy in May 2018. Treatment was complicated by chemo-induced peripheral neuropathy for which Taxol dose was reduced by 25%. She completed 5 weeks of external beam whole pelvic radiation on August 2018.Her original tumor in 2018 was ER +1%. PR negative. MSI stable and HER-2 negative  She was under clinical surveillance and recently presented to [name] with symptoms of right lower quadrant abdominal pain which led to aCT abdomen which showed new peritoneal metastatic disease throughout the right abdomen and pelvis with index masses measuring 5.7 x 4.7 cm. Head CT scan also showed evidence  of hypermetabolic abdominal pelvic peritoneal metastases but no evidence of other sites of metastases.  Repeat omental biopsy confirms high grade serous carcinoma.Foundation 1 testing showed following alteration/biomarkers. Microsatellite status stable. EPH for be amplification, ESR 1 amplification, HNF1 A G 292 FS 25, follow-up 1 amplification, with 3 ER 1 loss, PPP 2R1 AAS 256F, PTEN loss, T p53. However none of these mutations have actionable targets.  Repeat echocardiogram showed EF of 30 to 35%. Cardiac cath unremarkable consistent with nonischemic cardiomyopathy.Therefore Doxil not chosen and she would be getting carboplatin and gemcitabine.  Chemotherapy complicated by significant anemia causing supply demand cardiac ischemia and hospitalization.  Patient switched to [facility] and lenvatinib starting 01/11/2020   Interval history-patient presented recently to outside hospital at [facility] with symptoms of chest pain and tachycardia.  She underwent echocardiogram as well as CT scans Presently patient reports no significant leg swelling but does report exertional shortness of breath even when she walks from one room to another.  Reports ongoing fatigue.  Denies any abdominal pain  ECOG PS- 2 Pain scale- 0 Opioid associated constipation- no  Review of systems- Review of Systems  Constitutional: Positive for malaise/fatigue. Negative for chills, fever and weight loss.  HENT: Negative for congestion, ear discharge and nosebleeds.   Eyes: Negative for blurred vision.  Respiratory: Positive for shortness of breath. Negative for cough, hemoptysis, sputum production and wheezing.   Cardiovascular: Negative for chest pain, palpitations, orthopnea and claudication.  Gastrointestinal: Negative for abdominal pain, blood in stool, constipation, diarrhea, heartburn, melena, nausea and vomiting.  Genitourinary: Negative for dysuria, flank pain, frequency, hematuria and urgency.    Musculoskeletal: Negative for back pain, joint pain and myalgias.  Skin: Negative for rash.  Neurological: Negative for dizziness, tingling, focal weakness, seizures, weakness and headaches.  Endo/Heme/Allergies: Does  not bruise/bleed easily.  Psychiatric/Behavioral: Negative for depression and suicidal ideas. The patient does not have insomnia.       Allergies  Allergen Reactions  . Adhesive [Tape] Other (See Comments)    Burns the skin  . Z-Pak [Azithromycin] Itching  . Antifungal [Miconazole Nitrate] Rash  . Sulfa Antibiotics Nausea And Vomiting and Rash    N/T     Past Medical History:  Diagnosis Date  . Anxiety   . Cervicalgia   . CHF (congestive heart failure) ([place])   . Chronic kidney disease   . Coronary artery disease    a. 02/2012 Stress echo: severe anterior wall ischemia;  b. 02/2012 Cath/PCI: LAD 95p (3.0 x 15 Xience EX DES), D1 90ost (PTCA - bifurcational dzs), EF 45% with anterior HK;  b. 02/2013 Ex MV: fixed anterior defect w/ minor reversibility, nl EF-->Med Rx.  . Endometrial cancer ([place])    a. 07/2016 s/p robotic hysterectomy, BSO w/ washings, sentinel node inj, mapping, bx, adhesiolysis.  . Essential hypertension, benign   . Fibrocystic breast disease   . GERD (gastroesophageal reflux disease)   . Gestational hypertension   . Heart murmur   . History of anemia   . History of blood transfusion   . Hodgkin's lymphoma ([place]) 2011   a. s/p radiation and chemo therapy  . Osteoarthritis   . Polycystic ovarian disease      Past Surgical History:  Procedure Laterality Date  . ABDOMINAL HYSTERECTOMY    . bladder sling    . CARDIAC CATHETERIZATION  02/2012   [facility] 1 stent place  . CERVICAL POLYPECTOMY    . CHOLECYSTECTOMY  1982  . COLONOSCOPY WITH PROPOFOL N/A 02/05/2015   Procedure: COLONOSCOPY WITH PROPOFOL;  Surgeon: Lucilla Lame, MD;  Location: [facility] ENDOSCOPY;  Service: Endoscopy;  Laterality: N/A;  . CORONARY ANGIOPLASTY  02/2012   left/right s/p balloon   . CYSTOGRAM N/A 08/17/2016   Procedure: CYSTOGRAM;  Surgeon: Hollice Espy, MD;  Location: [facility] ORS;  Service: Urology;  Laterality: N/A;  . CYSTOSCOPY N/A 08/17/2016   Procedure: CYSTOSCOPY EXAM UNDER ANESTHESIA;  Surgeon: Hollice Espy, MD;  Location: [facility] ORS;  Service: Urology;  Laterality: N/A;  . CYSTOSCOPY W/ RETROGRADES Bilateral 08/17/2016   Procedure: CYSTOSCOPY WITH RETROGRADE PYELOGRAM;  Surgeon: Hollice Espy, MD;  Location: [facility] ORS;  Service: Urology;  Laterality: Bilateral;  . CYSTOSCOPY WITH STENT PLACEMENT Right 08/17/2016   Procedure: CYSTOSCOPY WITH STENT PLACEMENT;  Surgeon: Hollice Espy, MD;  Location: [facility] ORS;  Service: Urology;  Laterality: Right;  . heart stent'  2013  . kidney stent Right 2018  . LYMPH NODE BIOPSY  2011   diagnosis of hodgkins lymphoma  . PELVIC LYMPH NODE DISSECTION N/A 07/29/2016   Procedure: PELVIC/AORTIC LYMPH NODE SAMPLING;  Surgeon: [name], MD;  Location: [facility] ORS;  Service: Gynecology;  Laterality: N/A;  . PORTA CATH INSERTION N/A 09/22/2016   Procedure: Glori Luis Cath Insertion;  Surgeon: [name], MD;  Location: [place] [facility];  Service: Cardiovascular;  Laterality: N/A;  . PORTA CATH INSERTION N/A 10/27/2019   Procedure: PORTA CATH INSERTION;  Surgeon: [name], MD;  Location: [place] [facility];  Service: Cardiovascular;  Laterality: N/A;  . PORTA CATH REMOVAL N/A 11/17/2016   Procedure: Glori Luis Cath Removal;  Surgeon: [name], MD;  Location: [place] [facility];  Service: Cardiovascular;  Laterality: N/A;  . RIGHT/LEFT HEART CATH AND CORONARY ANGIOGRAPHY N/A 12/04/2019   Procedure: RIGHT/LEFT HEART CATH AND CORONARY  ANGIOGRAPHY;  Surgeon: Wellington Hampshire, MD;  Location: [place] [facility];  Service: Cardiovascular;  Laterality: N/A;  . ROBOTIC ASSISTED TOTAL HYSTERECTOMY WITH BILATERAL SALPINGO OOPHERECTOMY N/A 07/29/2016   Procedure: ROBOTIC ASSISTED TOTAL HYSTERECTOMY WITH BILATERAL SALPINGO  OOPHORECTOMY;  Surgeon: [name], MD;  Location: [facility] ORS;  Service: Gynecology;  Laterality: N/A;  . SENTINEL NODE BIOPSY N/A 07/29/2016   Procedure: SENTINEL NODE BIOPSY;  Surgeon: [name], MD;  Location: [facility] ORS;  Service: Gynecology;  Laterality: N/A;  . transobturator sling N/A 2009   [place]    Social History   Socioeconomic History  . Marital status: Widowed    Spouse name: Not on file  . Number of children: Not on file  . Years of education: Not on file  . Highest education level: Not on file  Occupational History  . Not on file  Tobacco Use  . Smoking status: Former Smoker    Packs/day: 1.00    Years: 30.00    Pack years: 30.00    Types: Cigarettes    Quit date: 07/24/2002    Years since quitting: 17.5  . Smokeless tobacco: Never Used  . Tobacco comment: quit smoking in 2000  Vaping Use  . Vaping Use: Never used  Substance and Sexual Activity  . Alcohol use: Not Currently  . Drug use: No  . Sexual activity: Never  Other Topics Concern  . 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    Social Determinants of Health   Financial Resource Strain:   . Difficulty of Paying Living Expenses: Not on file  Food Insecurity:   . Worried About Charity fundraiser in the Last Year: Not on file  . Ran Out of Food in the Last Year: Not on file  Transportation Needs:   . Lack of Transportation (Medical): Not on file  . Lack of Transportation (Non-Medical): Not on file  Physical Activity:   . Days of Exercise per Week: Not on file  . Minutes of Exercise per Session: Not on file  Stress:   . Feeling of Stress : Not on file  Social Connections:   . Frequency of Communication with Friends and Family: Not on file  . Frequency of Social Gatherings with Friends and Family: Not on file  . Attends Religious Services: Not on file  . Active Member of Clubs or  Organizations: Not on file  . Attends Archivist Meetings: Not on file  . Marital Status: Not on file  Intimate Partner Violence:   . Fear of Current or Ex-Partner: Not on file  . Emotionally Abused: Not on file  . Physically Abused: Not on file  . Sexually Abused: Not on file    Family History  Problem Relation Age of Onset  . ALS Father   . Polymyositis Father   . Diabetes Brother   . Cancer Maternal Aunt        breast  . Breast cancer Maternal Aunt        30's  . Stroke Maternal Grandmother   . Cancer Maternal Grandfather        prostate  . Stroke Maternal Grandfather   . Colon cancer Maternal Aunt   . Non-Hodgkin's lymphoma Cousin      Current Outpatient Medications:  .  aspirin EC 81 MG tablet, Take 81 mg by mouth at bedtime., Disp: ,  Rfl:  .  atorvastatin (LIPITOR) 10 MG tablet, Take 1 tablet by mouth once daily (Patient taking differently: Take 10 mg by mouth every evening. ), Disp: 90 tablet, Rfl: 2 .  cholecalciferol (VITAMIN D3) 25 MCG (1000 UNIT) tablet, Take 1,000 Units by mouth in the morning and at bedtime., Disp: , Rfl:  .  cyanocobalamin (,VITAMIN B-12,) 1000 MCG/ML injection, INJECT 1ML IM WEEKLY FOR 4 WEEKS, THEN INJECT MONTHLY THEREAFTER (Patient taking differently: Inject 1,000 mcg into the skin every 30 (thirty) days. ), Disp: 10 mL, Rfl: 0 .  furosemide (LASIX) 20 MG tablet, Take 1 tablet (20 mg total) by mouth as needed for fluid or edema (Take one tablet daily as needed for lower extremity edema or for weight gain of 2 lbs overnight or 5 lbs in one week.). (Patient taking differently: Take 20 mg by mouth daily. ), Disp: 30 tablet, Rfl: 2 .  levothyroxine (SYNTHROID) 50 MCG tablet, Take 1 tablet (50 mcg total) by mouth daily before breakfast., Disp: 30 tablet, Rfl: 3 .  metoprolol tartrate (LOPRESSOR) 25 MG tablet, Take 12.5 mg by mouth 2 (two) times daily., Disp: , Rfl:  .  pantoprazole (PROTONIX) 20 MG tablet, Take 1 tablet (20 mg total) by mouth  daily., Disp: 60 tablet, Rfl: 1 .  DULoxetine (CYMBALTA) 30 MG capsule, Take 1 capsule (30 mg total) by mouth daily., Disp: 90 capsule, Rfl: 2 .  lenvatinib 10 mg daily dose (LENVIMA, 10 MG DAILY DOSE,) capsule, Take 1 capsule (10 mg total) by mouth daily., Disp: 30 capsule, Rfl: 1 .  LORazepam (ATIVAN) 0.5 MG tablet, Take 1 tablet (0.5 mg total) by mouth every 6 (six) hours as needed for anxiety (and to help with breathing)., Disp: 120 tablet, Rfl: 0 .  oxyCODONE (OXY IR/ROXICODONE) 5 MG immediate release tablet, Take 1 tablet (5 mg total) by mouth every 6 (six) hours as needed for severe pain. (Patient not taking: Reported on 02/01/2020), Disp: 30 tablet, Rfl: 0 No current facility-administered medications for this visit.  Facility-Administered Medications Ordered in Other Visits:  .  heparin lock flush 100 unit/mL, 500 Units, Intravenous, Once, Randa Evens C, MD .  sodium chloride flush (NS) 0.9 % injection 10 mL, 10 mL, Intravenous, PRN, Sindy Guadeloupe, MD, 10 mL at 12/07/19 0914 .  sodium chloride flush (NS) 0.9 % injection 10 mL, 10 mL, Intravenous, PRN, Sindy Guadeloupe, MD, 10 mL at 01/04/20 0850  Physical exam:  Vitals:   02/01/20 1033  BP: 117/72  Pulse: 80  Temp: (!) 96.6 F (35.9 C)  TempSrc: Tympanic  SpO2: 96%  Weight: 154 lb 8 oz (70.1 kg)   Physical Exam Eyes:     Pupils: Pupils are equal, round, and reactive to light.  Cardiovascular:     Rate and Rhythm: Normal rate and regular rhythm.     Heart sounds: Normal heart sounds.  Pulmonary:     Effort: Pulmonary effort is normal.     Breath sounds: Normal breath sounds.  Abdominal:     General: Bowel sounds are normal.     Palpations: Abdomen is soft.  Musculoskeletal:     Right lower leg: No edema.     Left lower leg: No edema.  Skin:    General: Skin is warm and dry.  Neurological:     Mental Status: She is alert and oriented to person, place, and time.      CMP Latest Ref Rng & Units 02/01/2020  Glucose 70 -  99 mg/dL 127(H)  BUN 8 - 23 mg/dL 18  Creatinine 0.44 - 1.00 mg/dL 1.54(H)  Sodium 135 - 145 mmol/L 141  Potassium 3.5 - 5.1 mmol/L 3.4(L)  Chloride 98 - 111 mmol/L 103  CO2 22 - 32 mmol/L 28  Calcium 8.9 - 10.3 mg/dL 8.4(L)  Total Protein 6.5 - 8.1 g/dL 6.8  Total Bilirubin 0.3 - 1.2 mg/dL 1.8(H)  Alkaline Phos 38 - 126 U/L 80  AST 15 - 41 U/L 29  ALT 0 - 44 U/L 15   CBC Latest Ref Rng & Units 02/01/2020  WBC 4.0 - 10.5 K/uL 5.0  Hemoglobin 12.0 - 15.0 g/dL 11.9(L)  Hematocrit 36 - 46 % 35.1(L)  Platelets 150 - 400 K/uL 134(L)    No images are attached to the encounter.  CT Chest W Contrast  Result Date: 01/16/2020 CLINICAL DATA:  Restaging recurrent endometrial cancer, peritoneal carcinomatosis, assess treatment response, chemotherapy, additional remote history of Hodgkin's lymphoma EXAM: CT CHEST, ABDOMEN, AND PELVIS WITH CONTRAST TECHNIQUE: Multidetector CT imaging of the chest, abdomen and pelvis was performed following the standard protocol during bolus administration of intravenous contrast. CONTRAST:  63m OMNIPAQUE IOHEXOL 300 MG/ML SOLN, additional oral enteric contrast COMPARISON:  CT chest angiogram, 12/29/2019, CT abdomen pelvis, 10/16/2019 FINDINGS: CT CHEST FINDINGS Cardiovascular: Right chest port catheter. Aortic atherosclerosis. Normal heart size. LAD stent. No pericardial effusion. Mediastinum/Nodes: No enlarged mediastinal, hilar, or axillary lymph nodes. Thyroid gland, trachea, and esophagus demonstrate no significant findings. 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 No pleural effusion or pneumothorax. Musculoskeletal: No chest wall mass or suspicious bone lesions identified. CT ABDOMEN PELVIS FINDINGS Hepatobiliary: No focal liver abnormality is seen. Hepatic steatosis. Status post cholecystectomy. Postoperative biliary dilatation. Pancreas: Unremarkable. No pancreatic ductal dilatation or surrounding inflammatory changes.  Spleen: Normal in size without significant abnormality. Adrenals/Urinary Tract: Adrenal glands are unremarkable. Kidneys are normal, without renal calculi, solid lesion, or hydronephrosis. Bladder is unremarkable. Stomach/Bowel: Stomach is within normal limits. Appendix is very diminutive. No evidence of bowel wall thickening, distention, or inflammatory changes. Sigmoid diverticulosis. Vascular/Lymphatic: Aortic atherosclerosis. No enlarged abdominal or pelvic lymph nodes. Reproductive: Status post hysterectomy. Previously seen soft tissue thickening at the right aspect of the low right hemipelvis is essentially resolved (series 2, image 98). Other: No abdominal wall hernia or abnormality. Significant interval reduction in size of multiple peritoneal or omental nodules in the anterior right hemiabdomen, the largest nodule measuring 3.6 x 2.7 cm, previously 5.7 x 4.7 cm when measured similarly (series 2, image 74). There is however a new peritoneal or omental nodule in the anterior left hemiabdomen measuring 1.0 x 0.8 cm (series 2, image 84). Musculoskeletal: No acute or significant osseous findings. Redemonstrated sclerotic bilateral sacral alar insufficiency fractures. IMPRESSION: 1. Significant interval reduction in size of multiple peritoneal or omental nodules in the anterior right hemiabdomen as well as soft tissue in the low right hemipelvis, consistent with treatment response. 2. There is however a new peritoneal or omental nodule in the anterior left hemiabdomen, concerning for mixed response to treatment. 3. Status post hysterectomy. 4. No evidence of metastatic disease in the chest. 5. 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 6. Hepatic steatosis. 7. Coronary artery disease.  Aortic Atherosclerosis (ICD10-I70.0). Electronically Signed   By: AEddie CandleM.D.   On: 01/16/2020 16:01   CT Abdomen  Pelvis W Contrast  Result Date: 01/16/2020 CLINICAL DATA:  Restaging recurrent endometrial cancer,  peritoneal carcinomatosis, assess treatment response, chemotherapy, additional remote history of Hodgkin's lymphoma EXAM: CT CHEST, ABDOMEN, AND PELVIS WITH CONTRAST TECHNIQUE: Multidetector CT imaging of the chest, abdomen and pelvis was performed following the standard protocol during bolus administration of intravenous contrast. CONTRAST:  79m OMNIPAQUE IOHEXOL 300 MG/ML SOLN, additional oral enteric contrast COMPARISON:  CT chest angiogram, 12/29/2019, CT abdomen pelvis, 10/16/2019 FINDINGS: CT CHEST FINDINGS Cardiovascular: Right chest port catheter. Aortic atherosclerosis. Normal heart size. LAD stent. No pericardial effusion. Mediastinum/Nodes: No enlarged mediastinal, hilar, or axillary lymph nodes. Thyroid gland, trachea, and esophagus demonstrate no significant findings. 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 No pleural effusion or pneumothorax. Musculoskeletal: No chest wall mass or suspicious bone lesions identified. CT ABDOMEN PELVIS FINDINGS Hepatobiliary: No focal liver abnormality is seen. Hepatic steatosis. Status post cholecystectomy. Postoperative biliary dilatation. Pancreas: Unremarkable. No pancreatic ductal dilatation or surrounding inflammatory changes. Spleen: Normal in size without significant abnormality. Adrenals/Urinary Tract: Adrenal glands are unremarkable. Kidneys are normal, without renal calculi, solid lesion, or hydronephrosis. Bladder is unremarkable. Stomach/Bowel: Stomach is within normal limits. Appendix is very diminutive. No evidence of bowel wall thickening, distention, or inflammatory changes. Sigmoid diverticulosis. Vascular/Lymphatic: Aortic atherosclerosis. No enlarged abdominal or pelvic lymph nodes. Reproductive: Status post hysterectomy. Previously seen soft tissue thickening at the right aspect of the low right hemipelvis  is essentially resolved (series 2, image 98). Other: No abdominal wall hernia or abnormality. Significant interval reduction in size of multiple peritoneal or omental nodules in the anterior right hemiabdomen, the largest nodule measuring 3.6 x 2.7 cm, previously 5.7 x 4.7 cm when measured similarly (series 2, image 74). There is however a new peritoneal or omental nodule in the anterior left hemiabdomen measuring 1.0 x 0.8 cm (series 2, image 84). Musculoskeletal: No acute or significant osseous findings. Redemonstrated sclerotic bilateral sacral alar insufficiency fractures. IMPRESSION: 1. Significant interval reduction in size of multiple peritoneal or omental nodules in the anterior right hemiabdomen as well as soft tissue in the low right hemipelvis, consistent with treatment response. 2. There is however a new peritoneal or omental nodule in the anterior left hemiabdomen, concerning for mixed response to treatment. 3. Status post hysterectomy. 4. No evidence of metastatic disease in the chest. 5. 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 6. Hepatic steatosis. 7. Coronary artery disease.  Aortic Atherosclerosis (ICD10-I70.0). Electronically Signed   By: AEddie CandleM.D.   On: 01/16/2020 16:01   MM 3D SCREEN BREAST BILATERAL  Result Date: 01/08/2020 CLINICAL DATA:  Screening. EXAM: DIGITAL SCREENING BILATERAL MAMMOGRAM WITH TOMO AND CAD COMPARISON:  Previous exam(s). ACR Breast Density Category b: There are scattered areas of fibroglandular density. FINDINGS: There are no findings suspicious for malignancy. Images were processed with CAD. IMPRESSION: No mammographic evidence of malignancy. A result letter of this screening mammogram will be mailed directly to the patient. RECOMMENDATION: Screening mammogram in one year. (Code:[id number]-B-01Y) BI-RADS CATEGORY  1: Negative. Electronically Signed   By: DLillia MountainM.D.   On: 01/08/2020 11:24     Assessment and plan- Patient is a 69y.o. 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  After stopping chemotherapy her hemoglobin is up to 11.9 today.  She does have some mild AKI with a creatinine of 1.5 and a bilirubin is elevated at 1.8.I suspect there is  a component of cardiorenal syndrome here causing AKI given her low ejection fraction.  She continues to follow-up with cardiology and we will obtain recent results of her outside echocardiogram as well.  2.  Counts are otherwise okay to proceed with cycle 2 of Keytruda today and I will see her back in 3 weeks for cycle 3.  3.  Dyspnea: I will dose reduce her Lenvima from 14 mg to 10 mg as patient reports subjective worsening of shortness of breath after she started Lenvima.  I will also start her on Xanax 0.5 mg every 6 hours to see if it would help her with breathing.  I do think that her underlying heart failure as well as ongoing therapy together contributing towards her subjective shortness of breath.  4.  Patient noted to have an elevated TSH of 15 when she started [name].  I suspect she has some baseline hypothyroidism even prior to starting Keytruda.  She has been started on levothyroxine 50 mcg which she will continue and I will repeat her TSH in 6 weeks time.  5.  Discussed that her overall prognosis remains poor in the setting of recurrent metastatic endometrial cancer as well as underlying heart failure.  Discussed CODE STATUS and patient would like to think about it and get back to me at her next visit.  She is leaning towards being a DNR/DNI.  6.  AKI: I have encouraged her to reestablish follow-up with Dr. Candiss Norse.  Holding off on IV fluids today as patient is on chronic Lasix for heart failure   Visit Diagnosis 1. High risk medication use   2.  Endometrial cancer ([place])   3. Peritoneal metastases ([place])   4. Encounter for monoclonal antibody treatment for malignancy   5. Autoimmune hypothyroidism   6. Goals of care, counseling/discussion   7. Dyspnea on exertion      Dr. Randa Evens, MD, MPH [facility] [phone] 02/01/2020 3:57 PM

## 2020-02-02 LAB — CA 125: Cancer Antigen (CA) 125: 17.8 U/mL (ref 0.0–38.1)

## 2020-02-05 ENCOUNTER — Ambulatory Visit: Payer: PPO | Admitting: Family

## 2020-02-05 ENCOUNTER — Encounter: Payer: Self-pay | Admitting: Family

## 2020-02-05 ENCOUNTER — Telehealth: Payer: Self-pay | Admitting: Family

## 2020-02-05 ENCOUNTER — Ambulatory Visit: Payer: PPO

## 2020-02-05 ENCOUNTER — Other Ambulatory Visit: Payer: PPO

## 2020-02-05 ENCOUNTER — Other Ambulatory Visit: Payer: Self-pay

## 2020-02-05 VITALS — BP 108/80 | HR 81 | Ht 61.0 in | Wt 154.4 lb

## 2020-02-05 DIAGNOSIS — R0600 Dyspnea, unspecified: Secondary | ICD-10-CM

## 2020-02-05 DIAGNOSIS — I38 Endocarditis, valve unspecified: Secondary | ICD-10-CM

## 2020-02-05 DIAGNOSIS — I5022 Chronic systolic (congestive) heart failure: Secondary | ICD-10-CM | POA: Diagnosis not present

## 2020-02-05 DIAGNOSIS — I10 Essential (primary) hypertension: Secondary | ICD-10-CM | POA: Diagnosis not present

## 2020-02-05 DIAGNOSIS — C541 Malignant neoplasm of endometrium: Secondary | ICD-10-CM

## 2020-02-05 DIAGNOSIS — N1832 Chronic kidney disease, stage 3b: Secondary | ICD-10-CM | POA: Diagnosis not present

## 2020-02-05 DIAGNOSIS — E876 Hypokalemia: Secondary | ICD-10-CM

## 2020-02-05 DIAGNOSIS — R0609 Other forms of dyspnea: Secondary | ICD-10-CM

## 2020-02-05 MED ORDER — CARVEDILOL 3.125 MG PO TABS: 3.1250 mg | ORAL_TABLET | Freq: Two times a day (BID) | ORAL | 2 refills | Status: DC

## 2020-02-05 MED ORDER — LOSARTAN POTASSIUM 25 MG PO TABS: 12.5000 mg | ORAL_TABLET | Freq: Every day | ORAL | 2 refills | Status: DC

## 2020-02-05 NOTE — Patient Instructions (Addendum)
Medication Instructions:  No medication changes today.  Jaime Montana, NP will discuss heart failure medication with Dr. [name] and call you with any changes. Our goal is to get you on more medications to help improve your heart pumping function (like your Metoprolol) without getting your blood pressure too low or stressing your kidneys.   Continue your Lasix 20mg  daily.   *If you need a refill on your cardiac medications before your next appointment, please call your pharmacy*  Lab Work: No lab work today.   Your recent lab work showed your potassium was mildly low. Recommend dietary sources of potassium.  Testing/Procedures: Your echocardiogram in [facility] showed a low heart pumping function which we were aware of.  Follow-Up: At [facility], you and your health needs are our priority.  As part of our continuing mission to provide you with exceptional heart care, we have created designated Provider Care Teams.  These Care Teams include your primary Cardiologist (physician) and Advanced Practice Providers (APPs -  Physician Assistants and Nurse Practitioners) who all work together to provide you with the care you need, when you need it.  We recommend signing up for the patient portal called "MyChart".  Sign up information is provided on this After Visit Summary.  MyChart is used to connect with patients for Virtual Visits (Telemedicine).  Patients are able to view lab/test results, encounter notes, upcoming appointments, etc.  Non-urgent messages can be sent to your provider as well.   To learn more about what you can do with MyChart, go to [email or web address].    Your next appointment:  In 2 weeks with Dr. [name] as previously scheduled.  Other Instructions

## 2020-02-05 NOTE — Telephone Encounter (Signed)
Called to review recommendation received from Dr. [name] after her clinic visit this morning.  As discussed with Dr. [name], she will stop metoprolol.  She will start carvedilol 3.125 mg twice daily and start losartan 12.5 mg daily.  If she tolerates this well we could consider transition to [facility] in the future.  I did discuss with her taking her losartan potentially in the evening to prevent hypotension.  Prescriptions were sent to [facility] for 30-day supply per her request.  She verbalized understanding of all instructions.  I will also send a separate message to her via MyChart so the instructions are written down.  Loel Dubonnet, NP

## 2020-02-05 NOTE — Addendum Note (Signed)
Addended by: Loel Dubonnet on: 02/05/2020 04:32 PM   Modules accepted: Orders

## 2020-02-05 NOTE — Progress Notes (Addendum)
Office Visit    Patient Name: Jaime Hall Date of Encounter: 02/05/2020  Primary Care Provider:  [name], MD Primary Cardiologist:  [name], MD Electrophysiologist:  None   Chief Complaint    Jaime Hall is a 69 y.o. female with a hx of CAD, Hodgkin's lymphoma, HFrEF, uterine cancer presents today for follow-up of HF and CAD.  Past Medical History    Past Medical History:  Diagnosis Date  . Anxiety   . Cervicalgia   . CHF (congestive heart failure) ([place])   . Chronic kidney disease   . Coronary artery disease    a. 02/2012 Stress echo: severe anterior wall ischemia;  b. 02/2012 Cath/PCI: LAD 95p (3.0 x 15 Xience EX DES), D1 90ost (PTCA - bifurcational dzs), EF 45% with anterior HK;  b. 02/2013 Ex MV: fixed anterior defect w/ minor reversibility, nl EF-->Med Rx.  . Endometrial cancer ([place])    a. 07/2016 s/p robotic hysterectomy, BSO w/ washings, sentinel node inj, mapping, bx, adhesiolysis.  . Essential hypertension, benign   . Fibrocystic breast disease   . GERD (gastroesophageal reflux disease)   . Gestational hypertension   . Heart murmur   . History of anemia   . History of blood transfusion   . Hodgkin's lymphoma ([place]) 2011   a. s/p radiation and chemo therapy  . Osteoarthritis   . Polycystic ovarian disease    Past Surgical History:  Procedure Laterality Date  . ABDOMINAL HYSTERECTOMY    . bladder sling    . CARDIAC CATHETERIZATION  02/2012   [facility] 1 stent place  . CERVICAL POLYPECTOMY    . CHOLECYSTECTOMY  1982  . COLONOSCOPY WITH PROPOFOL N/A 02/05/2015   Procedure: COLONOSCOPY WITH PROPOFOL;  Surgeon: Lucilla Lame, MD;  Location: [facility] ENDOSCOPY;  Service: Endoscopy;  Laterality: N/A;  . CORONARY ANGIOPLASTY  02/2012   left/right s/p balloon  . CYSTOGRAM N/A 08/17/2016   Procedure: CYSTOGRAM;  Surgeon: Hollice Espy, MD;  Location: [facility] ORS;  Service: Urology;  Laterality: N/A;  . CYSTOSCOPY N/A 08/17/2016   Procedure: CYSTOSCOPY EXAM  UNDER ANESTHESIA;  Surgeon: Hollice Espy, MD;  Location: [facility] ORS;  Service: Urology;  Laterality: N/A;  . CYSTOSCOPY W/ RETROGRADES Bilateral 08/17/2016   Procedure: CYSTOSCOPY WITH RETROGRADE PYELOGRAM;  Surgeon: Hollice Espy, MD;  Location: [facility] ORS;  Service: Urology;  Laterality: Bilateral;  . CYSTOSCOPY WITH STENT PLACEMENT Right 08/17/2016   Procedure: CYSTOSCOPY WITH STENT PLACEMENT;  Surgeon: Hollice Espy, MD;  Location: [facility] ORS;  Service: Urology;  Laterality: Right;  . heart stent'  2013  . kidney stent Right 2018  . LYMPH NODE BIOPSY  2011   diagnosis of hodgkins lymphoma  . PELVIC LYMPH NODE DISSECTION N/A 07/29/2016   Procedure: PELVIC/AORTIC LYMPH NODE SAMPLING;  Surgeon: [name], MD;  Location: [facility] ORS;  Service: Gynecology;  Laterality: N/A;  . PORTA CATH INSERTION N/A 09/22/2016   Procedure: Glori Luis Cath Insertion;  Surgeon: [name], MD;  Location: [place] CV LAB;  Service: Cardiovascular;  Laterality: N/A;  . PORTA CATH INSERTION N/A 10/27/2019   Procedure: PORTA CATH INSERTION;  Surgeon: [name], MD;  Location: [place] [facility];  Service: Cardiovascular;  Laterality: N/A;  . PORTA CATH REMOVAL N/A 11/17/2016   Procedure: Glori Luis Cath Removal;  Surgeon: [name], MD;  Location: [place] [facility];  Service: Cardiovascular;  Laterality: N/A;  . RIGHT/LEFT HEART CATH AND CORONARY ANGIOGRAPHY N/A 12/04/2019   Procedure: RIGHT/LEFT  HEART CATH AND CORONARY ANGIOGRAPHY;  Surgeon: Wellington Hampshire, MD;  Location: [place] [facility];  Service: Cardiovascular;  Laterality: N/A;  . ROBOTIC ASSISTED TOTAL HYSTERECTOMY WITH BILATERAL SALPINGO OOPHERECTOMY N/A 07/29/2016   Procedure: ROBOTIC ASSISTED TOTAL HYSTERECTOMY WITH BILATERAL SALPINGO OOPHORECTOMY;  Surgeon: [name], MD;  Location: [facility] ORS;  Service: Gynecology;  Laterality: N/A;  . SENTINEL NODE BIOPSY N/A 07/29/2016   Procedure: SENTINEL NODE BIOPSY;  Surgeon:  [name], MD;  Location: [facility] ORS;  Service: Gynecology;  Laterality: N/A;  . transobturator sling N/A 2009   [place]   Allergies Allergies  Allergen Reactions  . Adhesive [Tape] Other (See Comments)    Burns the skin  . Z-Pak [Azithromycin] Itching  . Antifungal [Miconazole Nitrate] Rash  . Sulfa Antibiotics Nausea And Vomiting and Rash    N/T    History of Present Illness    Jaime Hall is a 69 y.o. female with a hx of CAD (prior stent to LAD), Hodgkin's lymphoma, uterine cancer, endometrial cancer, HFrEF. She was last seen 02/01/20 by Darylene Price, NP in the [facility] HF clinic.  History of Hodgkin's lymphoma treated with radiation and chemotherapy to the chest area previously in remission since 2011.  Uterine cancer in 2018 treated with surgery and chemotherapy.  She is a former smoker.  Her CAD is s/p angioplasty and DES to proximal LAD with balloon angioplasty to the diagonal September 2013.  Treadmill nuclear stress test September 2014 fixed anterior wall defect with minor reversibility and normal LVEF.   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 Seen in clinic 11/02/19 for clearance for chemotherapy. Echo 11/02/19 showed LVEF 30-35%, gr1DD, RV normal size and function, mildly elevated PASP, mild-moderate MR/AI. 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  Dr. Janese Banks, her oncologist, did change her chemotherapy agent due to the newly diagnosed heart failure. Subsequent stress test to rule out ischemic cardiomyopathy was high risk. R/LHC performed 12/04/19 with LVEF 30-35%, LVEDP moderately elevated, previous LAD stent patent, ost-prox LAD 40%. She was started on Lasix 20mg  daily. This was reduced to PRN due to orthostasis.   Presented to ED in [facility] after 1 week history of SOB and orthopnea she took her PRN Lsix but  had vomitinga nd continues to void - she was treated with IV diuresis. Echo 01/26/20 at outside facility EF 25-30%, severe global hypokinesis, RV normal size/function, moderate MR, mild AI without aortic stenosis. CT chest with no PE, pulmonary edema with small bilateral pleural effusions, soft tissue thickening bronchiectasis likely scar/post radiation changes.   Reports today for follow-up.  Reports since hospital discharge she is taking Lasix 20 mg daily.  She had marginally low potassium 02/01/2020 of 3.4.  We discussed dietary sources of potassium.  Reports intermittent lightheadedness is similar compared to previous but overall not bothersome.  She has been transition from chemotherapy to immunotherapy with oncology.  Tells me she notices more nausea and vomiting.   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  Endorses dyspnea on exertion, fatigue.  She is very interested in any therapies that will help to strengthen her heart.  EKGs/Labs/Other Studies Reviewed:   The following studies were reviewed today: Echo 11/02/19 IMPRESSIONS    1. Left ventricular ejection fraction, by estimation, is 30 to 35%. The  left ventricle has moderately decreased function.  The left ventricle  demonstrates global hypokinesis. Left ventricular diastolic parameters are  consistent with Grade I diastolic  dysfunction (impaired relaxation).   2. Right ventricular systolic function is normal. The right ventricular  size is normal. There is mildly elevated pulmonary artery systolic  pressure. The estimated right ventricular systolic pressure is 70.6 mmHg.   3. Mild to moderate mitral valve regurgitation.   4. Aortic valve regurgitation is mild to moderate.   EKG:  EKG is ordered today.  The ekg ordered today demonstrates 81 bpm with nonspecific intraventricular block and new T wave abnormality and lead II, V5, V6.  Due to recent LHC and no recurrent chest pain, pressure, tightness will  not plan for recurrent ischemic evaluation at this time.  Recent Labs: 12/29/2019: B Natriuretic Peptide 898.1 12/30/2019: Magnesium 1.7 01/16/2020: TSH 18.348 02/01/2020: ALT 15; BUN 18; Creatinine, Ser 1.54; Hemoglobin 11.9; Platelets 134; Potassium 3.4; Sodium 141  Recent Lipid Panel    Component Value Date/Time   CHOL 114 12/30/2019 0457   CHOL 110 04/11/2012 0835   TRIG 131 12/30/2019 0457   HDL 47 12/30/2019 0457   HDL 56 04/11/2012 0835   CHOLHDL 2.4 12/30/2019 0457   VLDL 26 12/30/2019 0457   LDLCALC 41 12/30/2019 0457   LDLCALC 41 04/11/2012 0835    Home Medications   Current Meds  Medication Sig  . aspirin EC 81 MG tablet Take 81 mg by mouth at bedtime.  Marland Kitchen atorvastatin (LIPITOR) 10 MG tablet Take 1 tablet by mouth once daily (Patient taking differently: Take 10 mg by mouth every evening. )  . cholecalciferol (VITAMIN D3) 25 MCG (1000 UNIT) tablet Take 1,000 Units by mouth in the morning and at bedtime.  . cyanocobalamin (,VITAMIN B-12,) 1000 MCG/ML injection INJECT 1ML IM WEEKLY FOR 4 WEEKS, THEN INJECT MONTHLY THEREAFTER (Patient taking differently: Inject 1,000 mcg into the skin every 30 (thirty) days. )  . DULoxetine (CYMBALTA) 30 MG capsule Take 1 capsule (30 mg total) by mouth daily.  . furosemide (LASIX) 20 MG tablet Take 1 tablet (20 mg total) by mouth as needed for fluid or edema (Take one tablet daily as needed for lower extremity edema or for weight gain of 2 lbs overnight or 5 lbs in one week.). (Patient taking differently: Take 20 mg by mouth daily. )  . lenvatinib 10 mg daily dose (LENVIMA, 10 MG DAILY DOSE,) capsule Take 1 capsule (10 mg total) by mouth daily.  Marland Kitchen levothyroxine (SYNTHROID) 50 MCG tablet Take 1 tablet (50 mcg total) by mouth daily before breakfast.  . LORazepam (ATIVAN) 0.5 MG tablet Take 1 tablet (0.5 mg total) by mouth every 6 (six) hours as needed for anxiety (and to help with breathing).  . metoprolol tartrate (LOPRESSOR) 25 MG tablet Take 12.5  mg by mouth 2 (two) times daily.  . pantoprazole (PROTONIX) 20 MG tablet Take 1 tablet (20 mg total) by mouth daily.    Review of Systems    Review of Systems  Constitutional: Positive for malaise/fatigue. Negative for chills and fever.  Cardiovascular: Positive for dyspnea on exertion (with more than usual activity). Negative for chest pain, leg swelling, near-syncope, orthopnea, palpitations and syncope.  Respiratory: Negative for cough, shortness of breath and wheezing.   Gastrointestinal: Positive for nausea and vomiting.  Neurological: Positive for light-headedness. Negative for dizziness and weakness.   All other systems reviewed and are otherwise negative except as noted above.  Physical Exam   VS:  BP 108/80 (BP Location: Left Arm,  Patient Position: Sitting, Cuff Size: Normal)   Pulse 81   Ht 5\' 1"  (1.549 m)   Wt 154 lb 6.4 oz (70 kg)   SpO2 93%   BMI 29.17 kg/m  , BMI Body mass index is 29.17 kg/m. GEN: Well nourished, well developed, in no acute distress. HEENT: normal. Neck: Supple, no JVD, carotid bruits, or masses. Cardiac: RRR, no murmurs, rubs, or gallops. No clubbing, cyanosis, edema.  Radials/DP/PT 2+ and equal bilaterally.  Respiratory:  Respirations regular and unlabored, clear to auscultation bilaterally. GI: Soft, nontender, nondistended, BS + x 4. MS: No deformity or atrophy. Skin: Warm and dry, no rash. Neuro:  Strength and sensation are intact. Psych: Normal affect.  Assessment & Plan   1. CAD - Remote stent LAD 2013. Cath 12/04/19 with patent stent and ost-lad 40% stenosed. Continue GDMT including aspirin, beta blocker, statin.  Reports no chest pain, pressure, tightness. EKG today with T-wave changes in leads II, V5, V6. Due to recent cath and no anginal symptoms, no indication for ischemic evaluation at this time.  2. HLD, HLD goal <70 - Continue Atorvastatin 10mg  daily.   3. HTN - BP low normal today. Reports intermittently lightheadedness which is  overall stable compared to previous.  Recommend making position changes carefully and compression stockings.  Her metoprolol has been previously reduced to 12.5 mg twice daily.   4. HFrEF - Echo 11/02/19 as part of clearance for chemo revealed LVEF 30-35% and gr1DD. Subsequent R/LHC with no ischemia. 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  Recent ED visit in [facility] requiring IV diuresis with CT scan showing small bilateral pleural effusions and echo with EF 25-30%. Unclear whether true decline in EF versus difference in reading physician. NYHA III.  Trace pedal edema on exam today. Addition of HF therapies limited by low BP, lightheadedness, and declining renal function.  She has been recommended to follow-up with nephrology by her oncologist..  She is maintained on metoprolol tartrate 12.5 mg twice daily and Lasix 20 mg daily.   Educated on low sodium diet and limiting to 2L fluid.  Discussed with Dr. [name], he recommended transition to Coreg 3.125mg  twice daily (discontinue Metoprolol) and starting Losartan 12.5mg  daily. Called Miss Schraeder to update her with plan.  5. CKD3b -02/01/2020 creatinine 1.54, GFR 34.  Careful titration of diuretics, antihypertensives, and heart failure therapies.  Continue to follow with nephrology.  6. Hypokalemia -02/01/2019 K3.4. Likely due to diuresis and GI upset with immunotherapy.  Upcoming labs with oncology.  Encouraged to increase intake of high potassium foods such as banana and cantaloupe.  She was agreeable.  We will need to monitor for need for routine potassium supplementation in setting of diuresis.  7. Valvular heart disease - Mild to moderate MR/AI by echo. Continue optimal BP and volume control. Anticipate monitoring with periodic echo.   Disposition: Follow up in 2 week(s) with Dr. [name].    Loel Dubonnet, NP 02/05/2020, 10:50 AM

## 2020-02-07 ENCOUNTER — Other Ambulatory Visit: Payer: Self-pay

## 2020-02-07 ENCOUNTER — Inpatient Hospital Stay (HOSPITAL_BASED_OUTPATIENT_CLINIC_OR_DEPARTMENT_OTHER): Payer: PPO | Admitting: Obstetrics and Gynecology

## 2020-02-07 VITALS — HR 90 | Temp 98.3°F | Resp 18 | Wt 154.6 lb

## 2020-02-07 DIAGNOSIS — C541 Malignant neoplasm of endometrium: Secondary | ICD-10-CM

## 2020-02-07 DIAGNOSIS — Z5112 Encounter for antineoplastic immunotherapy: Secondary | ICD-10-CM | POA: Diagnosis not present

## 2020-02-07 DIAGNOSIS — C786 Secondary malignant neoplasm of retroperitoneum and peritoneum: Secondary | ICD-10-CM

## 2020-02-07 MED ORDER — NYSTATIN 100000 UNIT/GM EX POWD
1.0000 | Freq: Four times a day (QID) | CUTANEOUS | 2 refills | Status: DC
Start: 2020-02-07 — End: 2020-02-20

## 2020-02-07 NOTE — Progress Notes (Signed)
Gynecologic Oncology Interval Visit   Referring Provider: Dr. 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 [place] female, initially seen in consultation from [name] and Garwin Brothers for endometrial cancer, s/p RA- hysterectomy, BSO, SN mapping and biopsy, pelvic adhesiolysis 2/18 and 3 cycles carbo-taxol and WPRT, with recurrent disease confirmed by biopsy. She returns to clinic today for follow-up.   Omental biopsy confirmed recurrent high-grade serous carcinoma.  Foundation 1 testing showed following alterations/biomarkers: Microsatellites status stable. Tumor mutational burden 5Muts/Mb, EPHB4 amplification, ESR1 amplification, HNF1 G233fs*25, PARP1 amplification, PIK3R1 loss, PPP2R1A S256F, PTEN loss, TP53 Y160fs*44.    Repeat echocardiogram showed EF of 30-35%.  Cardiac cath unremarkable consistent with nonischemic cardiomyopathy.  Therefore Doxil was not chosen and she would be getting carboplatin and gemcitabine.  Initiated on 11/06/2019 and received 3 cycles with Neulasta support.  Chemotherapy was complicated by significant anemia causing supply demand cardiac ischemia and hospitalization.    Switched to [name] and lenvatinib starting 01/11/2020.    Hemoglobin has been trending up since stopping carbo-gem.  Some mild AKI with creatinine of 1.5 and bilirubin elevated at 1.8 thought to be secondary to cardiorenal syndrome given low ejection fraction.  She is followed by cardiology.  She complained of dyspnea and her lenvatinib has been dose reduced from 14 mg to 10 mg and started Xanax.  TSH elevated at 15 when she started [facility] and she has started levothyroxine 50 mcg.   CT 01/16/20- CT Chest Abdomen Pelvis w contrast showed significant interval reduction in size of multiple peritoneal and omental nodules in the anterior right hemiabdomen as well as soft tissue of the low right hemipelvis  consistent with treatment response.  However, and new peritoneal and omental nodule in the anterior left hemiabdomen concerning for mixed response.  No evidence of metastatic disease in the chest.  Redemonstrated bilateral paramedian and perihilar radiation fibrosis consistent with history of Hodgkin's lymphoma and prior mantle radiation.  Coronary artery disease and hepatic steatosis.  CA 125 11/06/19  127 12/28/19  19.7 02/01/20  17.8  On her last pelvic exam she had a large mass at the top of the vagina.     Gynecologic Oncology History Jaime Hall is a pleasant G40P2 female who was seen in consultation from [name] and Garwin Brothers for grade 1 endometrial cancer.   Endometrial biopsy done 07/15/16 showed grade 1 endometrial cancer. Surgical management was recommended.   History is also significant for h/o Hodgkin's Lymphoma 2011 treated successfully at [facility] with chemo and RT.   Bladder sling for SUI 2009.  Benign cervical polyp removed 5/16. Has some leg edema due to chronic venous insufficiency.  Had angioplasty and cardiac stents in 2013 and was on Plavix for three years, now just ASA.  On 07/29/2016 she underwent robotic hysterectomy, and bilateral salpingo-oophorectomy with washings as well as sentinel node injection, mapping, and biopsy; pelvic adhesiolysis including enterolysis and ovariolysis > 45 minutes. She was noted to have severe pelvic adhesive disease involving gynecologic organs and the bowel. The cul de sac was obliterated and adherent to the posterior cervix. A bubble test was performed and was negative.   Postop she presented with fevers on 08/05/2016 and was admitted. CT scan 08/05/1016 revealed small pockets of air in the posterior pelvic floor likely postsurgical. There is a 1.8 x 3.8 cm complex fluid collection containing pockets of air with somewhat organized walls within the pelvis  at the hysterectomy bed most consistent with developing abscess. There is superior extension  of loculated fluid along the left lateral pelvic wall anterior to the left psoas muscle. The component of the loculated fluid along the left lateral pelvic wall measures 2.0 x 4.0 cm and fluid collection along the anterior surface of the left psoas muscle measures approximately 1.8 x 3.6 cm. Blood cultures: + bacterimia with bacteroides fragilis; urine cultures negative.   She was treated with IV antibiotics with improvement in her symptoms. She was discharged on 09/05/2015 on Augmentin.   She was seen by Dr. Leonides Schanz and complained of vaginal leakage. 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 Subsequently she underwent a double tied tests with oral Pyridium as well as backfilling the bladder with blue. 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  Fluid in her vaginal vault was also appreciated. She was referred to Dr. [name], Urology on 08/14/2016. No obvious fistula or leak appreciated on CT urogram, although there is no complete delayed imaging to assess the right-sided ureter.   CT urogram 08/13/2016 IMPRESSION: 1. Stable size of an abscess at the vaginal cuff with superior extension along the left pelvic sidewall to the posterior margin of the mid sigmoid colon. 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 2. No hydronephrosis. Normal caliber ureters. No evidence of contrast extravasation from the ureters or bladder, with limitations as described. 3. No evidence of metastatic disease in the abdomen or pelvis. 4. Aortic atherosclerosis.  On 08/17/3016 she underwent Exam under anesthesia, vaginoscopy, cystoscopy, cystogram with interpretation of fluoroscopy less than 30 minutes, bilateral retrograde pyelogram, and right ureteral stent placement.   Intraoperative findings: No obvious ureterovaginal vesicovaginal fistula appreciated. Mild right hydroureteronephrosis down to level of the distal ureter with slight  medial deviation without filling defects. Slight mucopurulent drainage from left apical vaginal cuff, specimen cultured.  Culture revealed bateroides fragilis and she was started on a 2 week course of Flagyl iniated on 08/24/2016.  Stent removed on 09/22/2016.   Pathology DIAGNOSIS:  A. SENTINEL LYMPH NODE, RIGHT MID OBTURATOR; EXCISION:  - NO TUMOR SEEN IN ONE LYMPH NODE (0/1).   B. SENTINEL LYMPH NODE, RIGHT EXTERNAL ILIAC; EXCISION:  - NO TUMOR SEEN IN ONE LYMPH NODE (0/1).   C. SENTINEL LYMPH NODE, LEFT EXTERNAL ILIAC; EXCISION:  - NO TUMOR SEEN IN ONE LYMPH NODE (0/1).   D. UTERUS WITH CERVIX, BILATERAL FALLOPIAN TUBES AND OVARIES;  HYSTERECTOMY AND BILATERAL SALPINGO-OOPHORECTOMY:  - SEROUS CARCINOMA, 4.5 CM(SEE NOTE).  - LYMPHOVASCULAR INVASION PRESENT.  - NO TUMOR SEEN IN BILATERAL OVARIES AND FALLOPIAN TUBES.  ENDOMETRIUM:  Procedure: Hysterectomy with bilateral salpingo-oophorectomy  Histologic Type: Serous carcinoma  Myometrial Invasion: Present       Depth of invasion (millimeters): 13 mm  Myometrial thickness (millimeters): 14 mm       Percentage of myometrial invasion: 93%  Uterine Serosa Involvement: Not identified  Cervical Stromal Involvement: Not identified    BIOMARKER REPORTING TEMPLATE:  p53 Expression: Abnormal strong diffuse overexpression (>90%)  ER/PR negative.  MS stable Her2neu assessment was performed given serous histology and was negative.   CT scan on 09/10/2016 with findings that are very reassuring so stent removed. 1. Interval resolution of abscess involving the vaginal cuff. 2. The proximal end of the right ureteral stent is coiled in the proximal right ureter. No hydronephrosis. 3. No evidence of metastatic disease or other significant changes. 4. Aortic atherosclerosis.   She was  referred to Dr. Janese Banks for adjuvant chemotherapy and completed 3 cycles of carbo/taxol. Notable side effects include neuropathy in hands due to prior  chemo in past for Hodgkin's disease and more recent regimen.  Taxol dose was reduced 25% with cycle 3 due to neuropathy.    Whole pelvis radiation with Dr Baruch Gouty completed in August 2018.  CT A/P 08/31/2017 IMPRESSION: No evidence of recurrent or metastatic disease.  10/06/2017 PET  IMPRESSION: NEGATIVE FOR MALIGNANCY 1. No findings of active malignancy in the neck, chest, abdomen, or pelvis. 2. Subacute sacral insufficiency fracture with transverse component at S1-2 and vertical components in both sacral ala. There is also a nondisplaced subacute fracture of the right L5 transverse process. Low-grade associated metabolic activity favors a benign etiology. 3. Other imaging findings of potential clinical significance: Aortic Atherosclerosis (ICD10-I70.0). Coronary atherosclerosis. Mild cardiomegaly. Stable paramediastinal fibrosis. Sigmoid diverticulosis.  She has continued back pain with prior hx of sacral insufficiency fracture and fracture of L5 transverse process. She was evaluated for this and declined intervention  She developed right lower quadrant pain and CT scan and PET that showed carcinomatosis.  No disease seen outside abdomen.  Biopsy done confirmed recurrent disease. She has significant residual neuropathy from prior chemo for this cancer and for lymphoma.  Tumor Markers:  Foundation 1 testing showed following alterations/biomarkers: Microsatellites status stable. Tumor mutational burden 5Muts/Mb, EPHB4 amplification, ESR1 amplification, HNF1 G245f*25, PARP1 amplification, PIK3R1 loss, PPP2R1A S256F, PTEN loss, TP53 [id number].  Problem List: Patient Active Problem List   Diagnosis Date Noted  . Chest pain 12/29/2019  . Leukocytosis 12/29/2019  . HTN (hypertension) 12/29/2019  . HLD (hyperlipidemia) 12/29/2019  . Elevated troponin 12/29/2019  . CKD (chronic kidney disease), stage IIIa 12/29/2019  . Depression with anxiety 12/29/2019  . Abnormal cardiovascular stress test    . Acute on chronic combined systolic and diastolic CHF (congestive heart failure) (HCH[place]  . Dyspnea on exertion   . Goals of care, counseling/discussion 10/20/2019  . Peritoneal carcinomatosis (HCFrannie05/12/2019  . Peritoneal metastases (HCHidden [place]05/08/2019  . Flank pain 10/04/2019  . Right lower quadrant abdominal pain 10/04/2019  . Candidiasis 10/04/2019  . Right anterior knee pain 07/25/2019  . Educated about COVID-19 virus infection 11/15/2018  . Cramps, muscle, general 04/06/2018  . Chronic kidney disease, stage 2, mildly decreased GFR 10/05/2017  . B12 deficiency anemia 07/24/2017  . Normocytic anemia 07/18/2017  . Back pain 07/18/2017  . Chemotherapy-induced peripheral neuropathy (HCPorter06/28/2018  . Hyperlipidemia 08/13/2016  . Hx of colonic polyps 08/13/2016  . Benign neoplasm of sigmoid colon 08/13/2016  . Endometrial cancer (HCRanchitos East03/06/2016  . 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 Lymphedema 07/20/2016  . Chronic venous insufficiency 07/20/2016  . PAD (peripheral artery disease) (HCPhe[place]02/10/2016  . Postmenopausal bleeding 07/14/2016  . Class 1 obesity due to excess calories without serious comorbidity with body mass index (BMI) of 34.0 to 34.9 in adult 05/27/2016  . Leg cramps 05/27/2016  . Bronchiectasis (HCHol[place]07/11/2015  . Pre-syncope 12/19/2015  . Prolapsed, uterovaginal, incomplete 06/24/2014  . Routine general medical examination at a health care facility 12/30/2012  . Obesity (BMI 30-39.9) 12/30/2012  . Hx of multiple pulmonary nodules 12/28/2012  . Plantar fasciitis of right foot 12/28/2012  . Neuropathy associated with lymphoma (HCTexola03/31/2014  . Hip pain, bilateral 09/12/2012  . History of Hodgkin's lymphoma 09/12/2012  . Coronary artery disease   . Chest pain on exertion 02/29/2012    Past Medical History: Past  Medical History:  Diagnosis Date  . Anxiety   . Cervicalgia   . CHF (congestive heart failure) ([place])   . Chronic  kidney disease   . Coronary artery disease    a. 02/2012 Stress echo: severe anterior wall ischemia;  b. 02/2012 Cath/PCI: LAD 95p (3.0 x 15 Xience EX DES), D1 90ost (PTCA - bifurcational dzs), EF 45% with anterior HK;  b. 02/2013 Ex MV: fixed anterior defect w/ minor reversibility, nl EF-->Med Rx.  . Endometrial cancer ([place])    a. 07/2016 s/p robotic hysterectomy, BSO w/ washings, sentinel node inj, mapping, bx, adhesiolysis.  . Essential hypertension, benign   . Fibrocystic breast disease   . GERD (gastroesophageal reflux disease)   . Gestational hypertension   . Heart murmur   . History of anemia   . History of blood transfusion   . Hodgkin's lymphoma ([place]) 2011   a. s/p radiation and chemo therapy  . Osteoarthritis   . Polycystic ovarian disease     Past Surgical History: Past Surgical History:  Procedure Laterality Date  . ABDOMINAL HYSTERECTOMY    . bladder sling    . CARDIAC CATHETERIZATION  02/2012   [facility] 1 stent place  . CERVICAL POLYPECTOMY    . CHOLECYSTECTOMY  1982  . COLONOSCOPY WITH PROPOFOL N/A 02/05/2015   Procedure: COLONOSCOPY WITH PROPOFOL;  Surgeon: Lucilla Lame, MD;  Location: [facility] ENDOSCOPY;  Service: Endoscopy;  Laterality: N/A;  . CORONARY ANGIOPLASTY  02/2012   left/right s/p balloon  . CYSTOGRAM N/A 08/17/2016   Procedure: CYSTOGRAM;  Surgeon: Hollice Espy, MD;  Location: [facility] ORS;  Service: Urology;  Laterality: N/A;  . CYSTOSCOPY N/A 08/17/2016   Procedure: CYSTOSCOPY EXAM UNDER ANESTHESIA;  Surgeon: Hollice Espy, MD;  Location: [facility] ORS;  Service: Urology;  Laterality: N/A;  . CYSTOSCOPY W/ RETROGRADES Bilateral 08/17/2016   Procedure: CYSTOSCOPY WITH RETROGRADE PYELOGRAM;  Surgeon: Hollice Espy, MD;  Location: [facility] ORS;  Service: Urology;  Laterality: Bilateral;  . CYSTOSCOPY WITH STENT PLACEMENT Right 08/17/2016   Procedure: CYSTOSCOPY WITH STENT PLACEMENT;  Surgeon: Hollice Espy, MD;  Location: [facility] ORS;  Service: Urology;  Laterality: Right;  . heart  stent'  2013  . kidney stent Right 2018  . LYMPH NODE BIOPSY  2011   diagnosis of hodgkins lymphoma  . PELVIC LYMPH NODE DISSECTION N/A 07/29/2016   Procedure: PELVIC/AORTIC LYMPH NODE SAMPLING;  Surgeon: [name], MD;  Location: [facility] ORS;  Service: Gynecology;  Laterality: N/A;  . PORTA CATH INSERTION N/A 09/22/2016   Procedure: Glori Luis Cath Insertion;  Surgeon: [name], MD;  Location: [place] CV LAB;  Service: Cardiovascular;  Laterality: N/A;  . PORTA CATH INSERTION N/A 10/27/2019   Procedure: PORTA CATH INSERTION;  Surgeon: [name], MD;  Location: [place] CV LAB;  Service: Cardiovascular;  Laterality: N/A;  . PORTA CATH REMOVAL N/A 11/17/2016   Procedure: Glori Luis Cath Removal;  Surgeon: [name], MD;  Location: [place] CV LAB;  Service: Cardiovascular;  Laterality: N/A;  . RIGHT/LEFT HEART CATH AND CORONARY ANGIOGRAPHY N/A 12/04/2019   Procedure: RIGHT/LEFT HEART CATH AND CORONARY ANGIOGRAPHY;  Surgeon: Wellington Hampshire, MD;  Location: [place] [facility] LAB;  Service: Cardiovascular;  Laterality: N/A;  . ROBOTIC ASSISTED TOTAL HYSTERECTOMY WITH BILATERAL SALPINGO OOPHERECTOMY N/A 07/29/2016   Procedure: ROBOTIC ASSISTED TOTAL HYSTERECTOMY WITH BILATERAL SALPINGO OOPHORECTOMY;  Surgeon: [name], MD;  Location: [facility] ORS;  Service: Gynecology;  Laterality: N/A;  . SENTINEL NODE BIOPSY N/A 07/29/2016  Procedure: SENTINEL NODE BIOPSY;  Surgeon: [name], MD;  Location: [facility] ORS;  Service: Gynecology;  Laterality: N/A;  . transobturator sling N/A 2009   [place]    Family History: Family History  Problem Relation Age of Onset  . ALS Father   . Polymyositis Father   . Diabetes Brother   . Cancer Maternal Aunt        breast  . Breast cancer Maternal Aunt        30's  . Stroke Maternal Grandmother   . Cancer Maternal Grandfather        prostate  . Stroke Maternal Grandfather   . Colon cancer Maternal  Aunt   . Non-Hodgkin's lymphoma Cousin     Social History: Works as Dealer Social History   Socioeconomic History  . Marital status: Widowed    Spouse name: Not on file  . Number of children: Not on file  . Years of education: Not on file  . Highest education level: Not on file  Occupational History  . Not on file  Tobacco Use  . Smoking status: Former Smoker    Packs/day: 1.00    Years: 30.00    Pack years: 30.00    Types: Cigarettes    Quit date: 07/24/2002    Years since quitting: 17.5  . Smokeless tobacco: Never Used  . Tobacco comment: quit smoking in 2000  Vaping Use  . Vaping Use: Never used  Substance and Sexual Activity  . Alcohol use: Not Currently  . Drug use: No  . Sexual activity: Never  Other Topics Concern  . 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    Social Determinants of Health   Financial Resource Strain:   . Difficulty of Paying Living Expenses: Not on file  Food Insecurity:   . Worried About Charity fundraiser in the Last Year: Not on file  . Ran Out of Food in the Last Year: Not on file  Transportation Needs:   . Lack of Transportation (Medical): Not on file  . Lack of Transportation (Non-Medical): Not on file  Physical Activity:   . Days of Exercise per Week: Not on file  . Minutes of Exercise per Session: Not on file  Stress:   . Feeling of Stress : Not on file  Social Connections:   . Frequency of Communication with Friends and Family: Not on file  . Frequency of Social Gatherings with Friends and Family: Not on file  . Attends Religious Services: Not on file  . Active Member of Clubs or Organizations: Not on file  . Attends Archivist Meetings: Not on file  . Marital Status: Not on file  Intimate Partner Violence:   . Fear of Current or Ex-Partner: Not on file  . Emotionally Abused: Not on file  . Physically Abused: Not on  file  . Sexually Abused: Not on file    Allergies: Allergies  Allergen Reactions  . Adhesive [Tape] Other (See Comments)    Burns the skin  . Z-Pak [Azithromycin] Itching  . Antifungal [Miconazole Nitrate] Rash  . Sulfa Antibiotics Nausea And Vomiting and Rash    N/T    Current Medications: Current Outpatient Medications  Medication Sig Dispense Refill  . aspirin EC 81 MG tablet Take 81 mg by mouth at bedtime.    Marland Kitchen atorvastatin (LIPITOR) 10 MG tablet Take 1  tablet by mouth once daily (Patient taking differently: Take 10 mg by mouth every evening. ) 90 tablet 2  . carvedilol (COREG) 3.125 MG tablet Take 1 tablet (3.125 mg total) by mouth 2 (two) times daily with a meal. 60 tablet 2  . cholecalciferol (VITAMIN D3) 25 MCG (1000 UNIT) tablet Take 1,000 Units by mouth in the morning and at bedtime.    . cyanocobalamin (,VITAMIN B-12,) 1000 MCG/ML injection INJECT 1ML IM WEEKLY FOR 4 WEEKS, THEN INJECT MONTHLY THEREAFTER (Patient taking differently: Inject 1,000 mcg into the skin every 30 (thirty) days. ) 10 mL 0  . DULoxetine (CYMBALTA) 30 MG capsule Take 1 capsule (30 mg total) by mouth daily. 90 capsule 2  . furosemide (LASIX) 20 MG tablet Take 1 tablet (20 mg total) by mouth as needed for fluid or edema (Take one tablet daily as needed for lower extremity edema or for weight gain of 2 lbs overnight or 5 lbs in one week.). (Patient taking differently: Take 20 mg by mouth daily. ) 30 tablet 2  . lenvatinib 10 mg daily dose (LENVIMA, 10 MG DAILY DOSE,) capsule Take 1 capsule (10 mg total) by mouth daily. 30 capsule 1  . levothyroxine (SYNTHROID) 50 MCG tablet Take 1 tablet (50 mcg total) by mouth daily before breakfast. 30 tablet 3  . LORazepam (ATIVAN) 0.5 MG tablet Take 1 tablet (0.5 mg total) by mouth every 6 (six) hours as needed for anxiety (and to help with breathing). 120 tablet 0  . losartan (COZAAR) 25 MG tablet Take 0.5 tablets (12.5 mg total) by mouth daily. 15 tablet 2  .  pantoprazole (PROTONIX) 20 MG tablet Take 1 tablet (20 mg total) by mouth daily. 60 tablet 1   No current facility-administered medications for this visit.   Facility-Administered Medications Ordered in Other Visits  Medication Dose Route Frequency Provider Last Rate Last Admin  . heparin lock flush 100 unit/mL  500 Units Intravenous Once Sindy Guadeloupe, MD      . sodium chloride flush (NS) 0.9 % injection 10 mL  10 mL Intravenous PRN Sindy Guadeloupe, MD   10 mL at 12/07/19 0914  . sodium chloride flush (NS) 0.9 % injection 10 mL  10 mL Intravenous PRN Sindy Guadeloupe, MD   10 mL at 01/04/20 0850   Review of Systems General:  Fatigue, weakness Skin: no complaints Eyes: no complaints HEENT: no complaints Breasts: no complaints Pulmonary: shortness of breath Cardiac: no complaints Gastrointestinal: decreased appetite, nausea, vomiting Genitourinary/Sexual: no complaints Ob/Gyn: no complaints Musculoskeletal: no complaints Hematology: no complaints Neurologic/Psych: no complaints  Objective:  Physical Examination:  Pulse 90   Temp 98.3 F (36.8 C)   Resp 18   Wt 154 lb 9.6 oz (70.1 kg)   SpO2 98%   BMI 29.21 kg/m    ECOG Performance Status: 1 - Symptomatic but completely ambulatory  GENERAL: More frail appearing compared to previous exams HEENT:  Sclera clear. Anicteric NODES:  Negative axillary, supraclavicular, inguinal lymph node survery LUNGS:  Clear to auscultation bilaterally.   HEART:  Regular rate and rhythm.  ABDOMEN:  Soft, nontender.  No hernias, incisions well healed. No masses or ascites EXTREMITIES:  L leg > R (chronic)  SKIN:  Clear with no obvious rashes or skin changes NEURO:  Nonfocal. Well oriented.  Appropriate affect.  Pelvic: Exam chaperoned by nursing. EGBUS: no lesions. Vagina: no lesions, discharge, or bleeding. Cervix, Uterus, Ovaries: surgically absent. Bimanual: reveals a vaginal cuff mass measuring 1.5-2  cm which has decreased since prior exam.  Rectal: confirmatory.   Radiology As per HPI    Assessment:  Jaime Hall is a 69 y.o. female diagnosed with recurrent high grade serous endometrial cancer, high intermediate risk disease s/p RA- hysterectomy, BSO, SN mapping and biopsy, pelvic adhesiolysis 2/18 and 3 cycles carbo-taxol and WPRT.  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 Complete resolution of vaginal cuff abscess.  Severe pelvic adhesive disease.  5/21 Recurrent disease with carcinomatosis  (not a candidate for Doxil due to EF and prior adriamycin); carboplatin/gemcitabine chemotherapy x 3 cycles but discontinued due to hematologic toxicity, cardiac issues and mixed response; currently on lenvatinib and pembrolizumab.    Foundation 1 testing showed following alterations/biomarkers: Microsatellites status stable. Tumor mutational burden 5Muts/Mb, EPHB4 amplification, ESR1 amplification, HNF1 G295f*25, PARP1 amplification, PIK3R1 loss, PPP2R1A S256F, PTEN loss, TP53 [id number].   Medical co-morbidities complicating care: 30 pack year former smoker s/p coronary angioplasty and stents 2013 and takes baby ASA. Prior chemo and RT for lymphoma.    Plan:   Problem List Items Addressed This Visit      Genitourinary   Endometrial cancer ([place]     She will continue to see Dr. RaJanese Banksor lenvatinib and pembrolizumab. If she progresses treatment options include mTOR or PIK3 inhibitor given tumor genomic alterations. However, her options may be limited due to cardiac disease. Fortunately she appears to be responding to therapy.   I personally had a face to face interaction and evaluated the patient jointly with the NP, Ms. LaBeckey Rutter I have reviewed her history and available records and have performed the key portions of the  physical exam including lymph node survey, abdominal exam, pelvic exam with my findings confirming those documented above by the APP.  I have discussed the case with the APP and the patient.  I agree with the above documentation, assessment and plan which was fully formulated by me.  Counseling was completed by me.   A total of at least 25 minutes were spent with the patient/family today; >50% was spent in education, counseling and coordination of care for endometrial cancer.  I personally saw the patient and performed a substantive portion of this encounter in conjunction with the listed APP as documented above.  Adylee Leonardo AlGaetana MichaelisMD

## 2020-02-07 NOTE — Addendum Note (Signed)
Addended by: Verlon Au on: 02/07/2020 12:00 PM   Modules accepted: Orders

## 2020-02-07 NOTE — Progress Notes (Signed)
Pt in for follow up, reports increased fatigue and weakness. Pt also reports nausea and vomiting on Monday and Tuesday.  States it is related to "medication change".

## 2020-02-08 ENCOUNTER — Other Ambulatory Visit: Payer: PPO

## 2020-02-08 ENCOUNTER — Ambulatory Visit: Payer: PPO | Admitting: Oncology

## 2020-02-08 ENCOUNTER — Ambulatory Visit: Payer: PPO

## 2020-02-13 ENCOUNTER — Encounter: Payer: Self-pay | Admitting: Emergency Medicine

## 2020-02-13 ENCOUNTER — Emergency Department: Payer: PPO

## 2020-02-13 ENCOUNTER — Other Ambulatory Visit: Payer: Self-pay

## 2020-02-13 DIAGNOSIS — Z923 Personal history of irradiation: Secondary | ICD-10-CM | POA: Diagnosis not present

## 2020-02-13 DIAGNOSIS — I2699 Other pulmonary embolism without acute cor pulmonale: Secondary | ICD-10-CM | POA: Diagnosis not present

## 2020-02-13 DIAGNOSIS — Z7982 Long term (current) use of aspirin: Secondary | ICD-10-CM

## 2020-02-13 DIAGNOSIS — D649 Anemia, unspecified: Secondary | ICD-10-CM | POA: Diagnosis not present

## 2020-02-13 DIAGNOSIS — Z9221 Personal history of antineoplastic chemotherapy: Secondary | ICD-10-CM | POA: Diagnosis not present

## 2020-02-13 DIAGNOSIS — R0602 Shortness of breath: Secondary | ICD-10-CM | POA: Diagnosis not present

## 2020-02-13 DIAGNOSIS — Z90710 Acquired absence of both cervix and uterus: Secondary | ICD-10-CM

## 2020-02-13 DIAGNOSIS — Z91048 Other nonmedicinal substance allergy status: Secondary | ICD-10-CM

## 2020-02-13 DIAGNOSIS — Z86010 Personal history of colonic polyps: Secondary | ICD-10-CM

## 2020-02-13 DIAGNOSIS — E039 Hypothyroidism, unspecified: Secondary | ICD-10-CM | POA: Diagnosis present

## 2020-02-13 DIAGNOSIS — I130 Hypertensive heart and chronic kidney disease with heart failure and stage 1 through stage 4 chronic kidney disease, or unspecified chronic kidney disease: Secondary | ICD-10-CM | POA: Diagnosis present

## 2020-02-13 DIAGNOSIS — N1831 Chronic kidney disease, stage 3a: Secondary | ICD-10-CM | POA: Diagnosis not present

## 2020-02-13 DIAGNOSIS — D696 Thrombocytopenia, unspecified: Secondary | ICD-10-CM | POA: Diagnosis present

## 2020-02-13 DIAGNOSIS — Z79899 Other long term (current) drug therapy: Secondary | ICD-10-CM | POA: Diagnosis not present

## 2020-02-13 DIAGNOSIS — Z8571 Personal history of Hodgkin lymphoma: Secondary | ICD-10-CM | POA: Diagnosis not present

## 2020-02-13 DIAGNOSIS — I5043 Acute on chronic combined systolic (congestive) and diastolic (congestive) heart failure: Secondary | ICD-10-CM | POA: Diagnosis not present

## 2020-02-13 DIAGNOSIS — Z807 Family history of other malignant neoplasms of lymphoid, hematopoietic and related tissues: Secondary | ICD-10-CM

## 2020-02-13 DIAGNOSIS — Z87891 Personal history of nicotine dependence: Secondary | ICD-10-CM

## 2020-02-13 DIAGNOSIS — Z955 Presence of coronary angioplasty implant and graft: Secondary | ICD-10-CM | POA: Diagnosis not present

## 2020-02-13 DIAGNOSIS — I5022 Chronic systolic (congestive) heart failure: Secondary | ICD-10-CM | POA: Diagnosis not present

## 2020-02-13 DIAGNOSIS — K219 Gastro-esophageal reflux disease without esophagitis: Secondary | ICD-10-CM | POA: Diagnosis present

## 2020-02-13 DIAGNOSIS — Z888 Allergy status to other drugs, medicaments and biological substances status: Secondary | ICD-10-CM

## 2020-02-13 DIAGNOSIS — Z79890 Hormone replacement therapy (postmenopausal): Secondary | ICD-10-CM

## 2020-02-13 DIAGNOSIS — C786 Secondary malignant neoplasm of retroperitoneum and peritoneum: Secondary | ICD-10-CM | POA: Diagnosis not present

## 2020-02-13 DIAGNOSIS — Z20822 Contact with and (suspected) exposure to covid-19: Secondary | ICD-10-CM | POA: Diagnosis not present

## 2020-02-13 DIAGNOSIS — R531 Weakness: Secondary | ICD-10-CM | POA: Diagnosis not present

## 2020-02-13 DIAGNOSIS — M722 Plantar fascial fibromatosis: Secondary | ICD-10-CM | POA: Diagnosis present

## 2020-02-13 DIAGNOSIS — C541 Malignant neoplasm of endometrium: Secondary | ICD-10-CM | POA: Diagnosis not present

## 2020-02-13 DIAGNOSIS — Z9049 Acquired absence of other specified parts of digestive tract: Secondary | ICD-10-CM

## 2020-02-13 DIAGNOSIS — F329 Major depressive disorder, single episode, unspecified: Secondary | ICD-10-CM | POA: Diagnosis present

## 2020-02-13 DIAGNOSIS — I2510 Atherosclerotic heart disease of native coronary artery without angina pectoris: Secondary | ICD-10-CM | POA: Diagnosis present

## 2020-02-13 DIAGNOSIS — F419 Anxiety disorder, unspecified: Secondary | ICD-10-CM | POA: Diagnosis present

## 2020-02-13 DIAGNOSIS — R0902 Hypoxemia: Secondary | ICD-10-CM | POA: Diagnosis not present

## 2020-02-13 DIAGNOSIS — Z882 Allergy status to sulfonamides status: Secondary | ICD-10-CM

## 2020-02-13 DIAGNOSIS — Z7189 Other specified counseling: Secondary | ICD-10-CM | POA: Diagnosis not present

## 2020-02-13 LAB — COMPREHENSIVE METABOLIC PANEL
ALT: 14 U/L (ref 0–44)
AST: 27 U/L (ref 15–41)
Albumin: 3.8 g/dL (ref 3.5–5.0)
Alkaline Phosphatase: 67 U/L (ref 38–126)
Anion gap: 14 (ref 5–15)
BUN: 19 mg/dL (ref 8–23)
CO2: 25 mmol/L (ref 22–32)
Calcium: 8.9 mg/dL (ref 8.9–10.3)
Chloride: 102 mmol/L (ref 98–111)
Creatinine, Ser: 1.27 mg/dL — ABNORMAL HIGH (ref 0.44–1.00)
GFR calc Af Amer: 50 mL/min — ABNORMAL LOW (ref >60)
GFR calc non Af Amer: 43 mL/min — ABNORMAL LOW (ref >60)
Glucose, Bld: 114 mg/dL — ABNORMAL HIGH (ref 70–99)
Potassium: 3.5 mmol/L (ref 3.5–5.1)
Sodium: 141 mmol/L (ref 135–145)
Total Bilirubin: 2.8 mg/dL — ABNORMAL HIGH (ref 0.3–1.2)
Total Protein: 6.8 g/dL (ref 6.5–8.1)

## 2020-02-13 LAB — CBC
HCT: 38.2 % (ref 36.0–46.0)
Hemoglobin: 12.5 g/dL (ref 12.0–15.0)
MCH: 31.3 pg (ref 26.0–34.0)
MCHC: 32.7 g/dL (ref 30.0–36.0)
MCV: 95.5 fL (ref 80.0–100.0)
Platelets: 127 10*3/uL — ABNORMAL LOW (ref 150–400)
RBC: 4 MIL/uL (ref 3.87–5.11)
RDW: 17.8 % — ABNORMAL HIGH (ref 11.5–15.5)
WBC: 6.3 10*3/uL (ref 4.0–10.5)
nRBC: 0 % (ref 0.0–0.2)

## 2020-02-13 LAB — BRAIN NATRIURETIC PEPTIDE: B Natriuretic Peptide: 2108.1 pg/mL — ABNORMAL HIGH (ref 0.0–100.0)

## 2020-02-13 LAB — MAGNESIUM: Magnesium: 1.3 mg/dL — ABNORMAL LOW (ref 1.7–2.4)

## 2020-02-13 NOTE — ED Triage Notes (Signed)
Pt in via [facility] from home, reports ongoing shortness of breath over the last few weeks; was hospitalized recently and advised to stay for further evaluation but refused.    Pt currently receiving chemotherapy due to endometrial cancer.    Vitals WDL, NAD noted at this time.

## 2020-02-13 NOTE — ED Triage Notes (Signed)
Pt in via EMS from home. [place] but she did not  96% RA, 128/73, 70RA

## 2020-02-14 ENCOUNTER — Inpatient Hospital Stay
Admission: EM | Admit: 2020-02-14 | Discharge: 2020-02-16 | DRG: 175 | Disposition: A | Discharge status: Home Health | Payer: PPO | Attending: Internal Medicine | Admitting: Internal Medicine

## 2020-02-14 ENCOUNTER — Emergency Department: Payer: PPO

## 2020-02-14 DIAGNOSIS — Z9049 Acquired absence of other specified parts of digestive tract: Secondary | ICD-10-CM | POA: Diagnosis not present

## 2020-02-14 DIAGNOSIS — C541 Malignant neoplasm of endometrium: Secondary | ICD-10-CM | POA: Diagnosis present

## 2020-02-14 DIAGNOSIS — D649 Anemia, unspecified: Secondary | ICD-10-CM | POA: Diagnosis not present

## 2020-02-14 DIAGNOSIS — Z79890 Hormone replacement therapy (postmenopausal): Secondary | ICD-10-CM | POA: Diagnosis not present

## 2020-02-14 DIAGNOSIS — Z79899 Other long term (current) drug therapy: Secondary | ICD-10-CM | POA: Diagnosis not present

## 2020-02-14 DIAGNOSIS — I130 Hypertensive heart and chronic kidney disease with heart failure and stage 1 through stage 4 chronic kidney disease, or unspecified chronic kidney disease: Secondary | ICD-10-CM | POA: Diagnosis present

## 2020-02-14 DIAGNOSIS — E039 Hypothyroidism, unspecified: Secondary | ICD-10-CM | POA: Diagnosis not present

## 2020-02-14 DIAGNOSIS — I2510 Atherosclerotic heart disease of native coronary artery without angina pectoris: Secondary | ICD-10-CM | POA: Diagnosis present

## 2020-02-14 DIAGNOSIS — N1831 Chronic kidney disease, stage 3a: Secondary | ICD-10-CM | POA: Diagnosis present

## 2020-02-14 DIAGNOSIS — F329 Major depressive disorder, single episode, unspecified: Secondary | ICD-10-CM | POA: Diagnosis present

## 2020-02-14 DIAGNOSIS — I5022 Chronic systolic (congestive) heart failure: Secondary | ICD-10-CM | POA: Diagnosis not present

## 2020-02-14 DIAGNOSIS — M722 Plantar fascial fibromatosis: Secondary | ICD-10-CM | POA: Diagnosis present

## 2020-02-14 DIAGNOSIS — I2699 Other pulmonary embolism without acute cor pulmonale: Secondary | ICD-10-CM

## 2020-02-14 DIAGNOSIS — Z955 Presence of coronary angioplasty implant and graft: Secondary | ICD-10-CM | POA: Diagnosis not present

## 2020-02-14 DIAGNOSIS — D696 Thrombocytopenia, unspecified: Secondary | ICD-10-CM | POA: Diagnosis present

## 2020-02-14 DIAGNOSIS — C786 Secondary malignant neoplasm of retroperitoneum and peritoneum: Secondary | ICD-10-CM | POA: Diagnosis present

## 2020-02-14 DIAGNOSIS — Z20822 Contact with and (suspected) exposure to covid-19: Secondary | ICD-10-CM | POA: Diagnosis present

## 2020-02-14 DIAGNOSIS — Z9221 Personal history of antineoplastic chemotherapy: Secondary | ICD-10-CM | POA: Diagnosis not present

## 2020-02-14 DIAGNOSIS — Z7189 Other specified counseling: Secondary | ICD-10-CM | POA: Diagnosis not present

## 2020-02-14 DIAGNOSIS — I5043 Acute on chronic combined systolic (congestive) and diastolic (congestive) heart failure: Secondary | ICD-10-CM | POA: Diagnosis present

## 2020-02-14 DIAGNOSIS — Z7982 Long term (current) use of aspirin: Secondary | ICD-10-CM | POA: Diagnosis not present

## 2020-02-14 DIAGNOSIS — K219 Gastro-esophageal reflux disease without esophagitis: Secondary | ICD-10-CM | POA: Diagnosis present

## 2020-02-14 DIAGNOSIS — Z91048 Other nonmedicinal substance allergy status: Secondary | ICD-10-CM | POA: Diagnosis not present

## 2020-02-14 DIAGNOSIS — F419 Anxiety disorder, unspecified: Secondary | ICD-10-CM | POA: Diagnosis present

## 2020-02-14 DIAGNOSIS — Z8571 Personal history of Hodgkin lymphoma: Secondary | ICD-10-CM | POA: Diagnosis not present

## 2020-02-14 DIAGNOSIS — I5023 Acute on chronic systolic (congestive) heart failure: Secondary | ICD-10-CM | POA: Diagnosis present

## 2020-02-14 DIAGNOSIS — Z86010 Personal history of colonic polyps: Secondary | ICD-10-CM | POA: Diagnosis not present

## 2020-02-14 DIAGNOSIS — Z923 Personal history of irradiation: Secondary | ICD-10-CM | POA: Diagnosis not present

## 2020-02-14 LAB — CBC
HCT: 33.9 % — ABNORMAL LOW (ref 36.0–46.0)
Hemoglobin: 11.3 g/dL — ABNORMAL LOW (ref 12.0–15.0)
MCH: 31.3 pg (ref 26.0–34.0)
MCHC: 33.3 g/dL (ref 30.0–36.0)
MCV: 93.9 fL (ref 80.0–100.0)
Platelets: 106 10*3/uL — ABNORMAL LOW (ref 150–400)
RBC: 3.61 MIL/uL — ABNORMAL LOW (ref 3.87–5.11)
RDW: 17.8 % — ABNORMAL HIGH (ref 11.5–15.5)
WBC: 4.4 10*3/uL (ref 4.0–10.5)
nRBC: 0 % (ref 0.0–0.2)

## 2020-02-14 LAB — HEPARIN LEVEL (UNFRACTIONATED): Heparin Unfractionated: 1.84 IU/mL — ABNORMAL HIGH (ref 0.30–0.70)

## 2020-02-14 LAB — TROPONIN I (HIGH SENSITIVITY)
Troponin I (High Sensitivity): 25 ng/L — ABNORMAL HIGH (ref <18)
Troponin I (High Sensitivity): 26 ng/L — ABNORMAL HIGH (ref <18)

## 2020-02-14 LAB — PROTIME-INR
INR: 1.1 (ref 0.8–1.2)
Prothrombin Time: 13.4 seconds (ref 11.4–15.2)

## 2020-02-14 LAB — APTT: aPTT: 61 seconds — ABNORMAL HIGH (ref 24–36)

## 2020-02-14 LAB — SARS CORONAVIRUS 2 BY RT PCR (HOSPITAL ORDER, PERFORMED IN ~~LOC~~ HOSPITAL LAB): SARS Coronavirus 2: NEGATIVE

## 2020-02-14 MED ORDER — MAGNESIUM SULFATE 2 GM/50ML IV SOLN
2.0000 g | Freq: Once | INTRAVENOUS | Status: AC
  Administered 2020-02-14: 2 g via INTRAVENOUS
  Filled 2020-02-14: qty 50

## 2020-02-14 MED ORDER — SODIUM CHLORIDE 0.9 % IV SOLN: INTRAVENOUS | Status: DC

## 2020-02-14 MED ORDER — VITAMIN D 25 MCG (1000 UNIT) PO TABS
1000.0000 [IU] | ORAL_TABLET | Freq: Every day | ORAL | Status: DC
  Administered 2020-02-14 – 2020-02-16 (×3): 1000 [IU] via ORAL
  Filled 2020-02-14 (×3): qty 1

## 2020-02-14 MED ORDER — IOHEXOL 350 MG/ML SOLN
60.0000 mL | Freq: Once | INTRAVENOUS | Status: AC | PRN
  Administered 2020-02-14: 60 mL via INTRAVENOUS

## 2020-02-14 MED ORDER — HEPARIN BOLUS VIA INFUSION
5000.0000 [IU] | Freq: Once | INTRAVENOUS | Status: AC
  Administered 2020-02-14: 5000 [IU] via INTRAVENOUS
  Filled 2020-02-14: qty 5000

## 2020-02-14 MED ORDER — ACETAMINOPHEN 650 MG RE SUPP: 650.0000 mg | Freq: Four times a day (QID) | RECTAL | Status: DC | PRN

## 2020-02-14 MED ORDER — HEPARIN (PORCINE) 25000 UT/250ML-% IV SOLN
1050.0000 [IU]/h | INTRAVENOUS | Status: DC
  Administered 2020-02-14: 1050 [IU]/h via INTRAVENOUS
  Filled 2020-02-14: qty 250

## 2020-02-14 MED ORDER — LENVATINIB (10 MG DAILY DOSE) 10 MG PO CPPK
10.0000 mg | ORAL_CAPSULE | Freq: Every day | ORAL | Status: DC
  Filled 2020-02-14: qty 1

## 2020-02-14 MED ORDER — ATORVASTATIN CALCIUM 10 MG PO TABS
10.0000 mg | ORAL_TABLET | Freq: Every evening | ORAL | Status: DC
  Administered 2020-02-14 – 2020-02-15 (×2): 10 mg via ORAL
  Filled 2020-02-14 (×3): qty 1

## 2020-02-14 MED ORDER — ASPIRIN EC 81 MG PO TBEC
81.0000 mg | DELAYED_RELEASE_TABLET | Freq: Every day | ORAL | Status: DC
  Administered 2020-02-15 (×2): 81 mg via ORAL
  Filled 2020-02-14 (×2): qty 1

## 2020-02-14 MED ORDER — PANTOPRAZOLE SODIUM 20 MG PO TBEC
20.0000 mg | DELAYED_RELEASE_TABLET | Freq: Every day | ORAL | Status: DC
  Administered 2020-02-14 – 2020-02-16 (×3): 20 mg via ORAL
  Filled 2020-02-14 (×4): qty 1

## 2020-02-14 MED ORDER — NYSTATIN 100000 UNIT/GM EX POWD
1.0000 "application " | Freq: Four times a day (QID) | CUTANEOUS | Status: DC
  Administered 2020-02-14 – 2020-02-16 (×8): 1 via TOPICAL
  Filled 2020-02-14: qty 15

## 2020-02-14 MED ORDER — LEVOTHYROXINE SODIUM 50 MCG PO TABS
50.0000 ug | ORAL_TABLET | Freq: Every day | ORAL | Status: DC
  Administered 2020-02-15 – 2020-02-16 (×2): 50 ug via ORAL
  Filled 2020-02-14 (×2): qty 1

## 2020-02-14 MED ORDER — ACETAMINOPHEN 325 MG PO TABS: 650.0000 mg | ORAL_TABLET | Freq: Four times a day (QID) | ORAL | Status: DC | PRN

## 2020-02-14 MED ORDER — LORAZEPAM 0.5 MG PO TABS
0.5000 mg | ORAL_TABLET | Freq: Four times a day (QID) | ORAL | Status: DC | PRN
  Administered 2020-02-15 – 2020-02-16 (×2): 0.5 mg via ORAL
  Filled 2020-02-14 (×2): qty 1

## 2020-02-14 MED ORDER — CARVEDILOL 3.125 MG PO TABS
3.1250 mg | ORAL_TABLET | Freq: Two times a day (BID) | ORAL | Status: DC
  Administered 2020-02-14 – 2020-02-16 (×5): 3.125 mg via ORAL
  Filled 2020-02-14 (×6): qty 1

## 2020-02-14 MED ORDER — DULOXETINE HCL 30 MG PO CPEP
30.0000 mg | ORAL_CAPSULE | Freq: Every day | ORAL | Status: DC
  Administered 2020-02-14 – 2020-02-16 (×3): 30 mg via ORAL
  Filled 2020-02-14 (×4): qty 1

## 2020-02-14 MED ORDER — ONDANSETRON HCL 4 MG PO TABS: 4.0000 mg | ORAL_TABLET | Freq: Four times a day (QID) | ORAL | Status: DC | PRN

## 2020-02-14 MED ORDER — ONDANSETRON HCL 4 MG/2ML IJ SOLN: 4.0000 mg | Freq: Four times a day (QID) | INTRAMUSCULAR | Status: DC | PRN

## 2020-02-14 MED ORDER — HEPARIN (PORCINE) 25000 UT/250ML-% IV SOLN
800.0000 [IU]/h | INTRAVENOUS | Status: DC
  Administered 2020-02-14: 800 [IU]/h via INTRAVENOUS

## 2020-02-14 NOTE — Progress Notes (Signed)
ANTICOAGULATION CONSULT NOTE - Initial Consult  Pharmacy Consult for Heparin Indication: pulmonary embolus  Allergies  Allergen Reactions  . Adhesive [Tape] Other (See Comments)    Skin Irritation   . Antifungal [Miconazole Nitrate] Rash  . Sulfa Antibiotics Nausea And Vomiting and Rash  . Z-Pak [Azithromycin] Itching    Patient Measurements: Height: 5\' 1"  (154.9 cm) Weight: 69.9 kg (154 lb) IBW/kg (Calculated) : 47.8 HEPARIN DW (KG): 62.8  Vital Signs: Temp: 98.9 F (37.2 C) (09/01 0340) Temp Source: Oral (09/01 0340) BP: 108/59 (09/01 0340) Pulse Rate: 83 (09/01 0340)  Labs: Recent Labs    02/13/20 1659  HGB 12.5  HCT 38.2  PLT 127*  CREATININE 1.27*   Estimated Creatinine Clearance: 36.8 mL/min (A) (by C-G formula based on SCr of 1.27 mg/dL (H)).  Medical History: Past Medical History:  Diagnosis Date  . Anxiety   . Cervicalgia   . CHF (congestive heart failure) ([place])   . Chronic kidney disease   . Coronary artery disease    a. 02/2012 Stress echo: severe anterior wall ischemia;  b. 02/2012 Cath/PCI: LAD 95p (3.0 x 15 Xience EX DES), D1 90ost (PTCA - bifurcational dzs), EF 45% with anterior HK;  b. 02/2013 Ex MV: fixed anterior defect w/ minor reversibility, nl EF-->Med Rx.  . Endometrial cancer ([place])    a. 07/2016 s/p robotic hysterectomy, BSO w/ washings, sentinel node inj, mapping, bx, adhesiolysis.  . Essential hypertension, benign   . Fibrocystic breast disease   . GERD (gastroesophageal reflux disease)   . Gestational hypertension   . Heart murmur   . History of anemia   . History of blood transfusion   . Hodgkin's lymphoma ([place]) 2011   a. s/p radiation and chemo therapy  . Osteoarthritis   . Polycystic ovarian disease     Medications:  (Not in a hospital admission)   Assessment: Heparin for PE.  Baseline labs ordered.  No anticoagulants PTA.   Goal of Therapy:  Heparin level 0.3-0.7 units/ml Monitor platelets by anticoagulation protocol:  Yes   Plan:  Heparin 5000 units bolus x 1 then infusion at 1050 units/hr Check HL ~ 8 hours after heparin started  Hart Robinsons A 02/14/2020,6:54 AM

## 2020-02-14 NOTE — Consult Note (Signed)
[place] CONSULT NOTE  Patient Care Team: [name], MD as PCP - General (Internal Medicine) Wellington Hampshire, MD as PCP - Cardiology (Cardiology) [name], MD as Consulting Physician (General Surgery) Mellody Drown, MD as Referring Physician (Obstetrics and Gynecology) [name], MD as Referring Physician (Obstetrics and Gynecology) Hollice Espy, MD as Consulting Physician (Urology) [name], RN as Registered Nurse Sindy Guadeloupe, MD as Consulting Physician (Oncology)  CHIEF COMPLAINTS/PURPOSE OF CONSULTATION: Acute pulmonary embolus  HISTORY OF PRESENTING ILLNESS:  Jaime Hall 69 y.o.  female with multiple medical problems including metastatic endometrial cancer; and congestive heart failure is currently admitted to hospital for worsening shortness of breath/and also generalized weakness.  Patient also had occasional chest discomfort in the right anterior chest wall.  Minimal cough no hemoptysis.  No fevers or chills.  CT scan/PE protocol showed acute segmental pulmonary emboli in the right lower lobe; groundglass opacities in the right lower lobe-unlikely ischemic.  Rule out infectious causes.  Patient's Covid test negative.  Of note patient's-BNP 2000;  On admission to hospital-2100; in July 2021- 898.  Hemoglobin 11.3 platelets 106 white count 4.4.  Creatinine 1.27/at baseline.  LFTs normal except for elevated bilirubin of 2.8.  Magnesium 1.3.  With regards to metastatic high-grade serous endometrial cancer patient is currently on lenvatinib plus Keytruda currently s/p 2 cycles.   Review of Systems  Constitutional: Positive for malaise/fatigue. Negative for chills, diaphoresis, fever and weight loss.  HENT: Negative for nosebleeds and sore throat.   Eyes: Negative for double vision.  Respiratory: Positive for cough, shortness of breath and wheezing. Negative for hemoptysis and sputum production.   Cardiovascular:  Positive for chest pain. Negative for palpitations, orthopnea and leg swelling.  Gastrointestinal: Negative for abdominal pain, blood in stool, constipation, diarrhea, heartburn, melena, nausea and vomiting.  Genitourinary: Negative for dysuria, frequency and urgency.  Musculoskeletal: Negative for back pain and joint pain.  Skin: Negative.  Negative for itching and rash.  Neurological: Negative for dizziness, tingling, focal weakness, weakness and headaches.  Endo/Heme/Allergies: Does not bruise/bleed easily.  Psychiatric/Behavioral: Negative for depression. The patient is not nervous/anxious and does not have insomnia.      MEDICAL HISTORY:  Past Medical History:  Diagnosis Date  . Anxiety   . Cervicalgia   . CHF (congestive heart failure) ([place])   . Chronic kidney disease   . Coronary artery disease    a. 02/2012 Stress echo: severe anterior wall ischemia;  b. 02/2012 Cath/PCI: LAD 95p (3.0 x 15 Xience EX DES), D1 90ost (PTCA - bifurcational dzs), EF 45% with anterior HK;  b. 02/2013 Ex MV: fixed anterior defect w/ minor reversibility, nl EF-->Med Rx.  . Endometrial cancer ([place])    a. 07/2016 s/p robotic hysterectomy, BSO w/ washings, sentinel node inj, mapping, bx, adhesiolysis.  . Essential hypertension, benign   . Fibrocystic breast disease   . GERD (gastroesophageal reflux disease)   . Gestational hypertension   . Heart murmur   . History of anemia   . History of blood transfusion   . Hodgkin's lymphoma ([place]) 2011   a. s/p radiation and chemo therapy  . Osteoarthritis   . Polycystic ovarian disease     SURGICAL HISTORY: Past Surgical History:  Procedure Laterality Date  . ABDOMINAL HYSTERECTOMY    . bladder sling    . CARDIAC CATHETERIZATION  02/2012   [facility] 1 stent place  . CERVICAL POLYPECTOMY    . CHOLECYSTECTOMY  1982  .  COLONOSCOPY WITH PROPOFOL N/A 02/05/2015   Procedure: COLONOSCOPY WITH PROPOFOL;  Surgeon: Lucilla Lame, MD;  Location: [facility] ENDOSCOPY;  Service:  Endoscopy;  Laterality: N/A;  . CORONARY ANGIOPLASTY  02/2012   left/right s/p balloon  . CYSTOGRAM N/A 08/17/2016   Procedure: CYSTOGRAM;  Surgeon: Hollice Espy, MD;  Location: [facility] ORS;  Service: Urology;  Laterality: N/A;  . CYSTOSCOPY N/A 08/17/2016   Procedure: CYSTOSCOPY EXAM UNDER ANESTHESIA;  Surgeon: Hollice Espy, MD;  Location: [facility] ORS;  Service: Urology;  Laterality: N/A;  . CYSTOSCOPY W/ RETROGRADES Bilateral 08/17/2016   Procedure: CYSTOSCOPY WITH RETROGRADE PYELOGRAM;  Surgeon: Hollice Espy, MD;  Location: [facility] ORS;  Service: Urology;  Laterality: Bilateral;  . CYSTOSCOPY WITH STENT PLACEMENT Right 08/17/2016   Procedure: CYSTOSCOPY WITH STENT PLACEMENT;  Surgeon: Hollice Espy, MD;  Location: [facility] ORS;  Service: Urology;  Laterality: Right;  . heart stent'  2013  . kidney stent Right 2018  . LYMPH NODE BIOPSY  2011   diagnosis of hodgkins lymphoma  . PELVIC LYMPH NODE DISSECTION N/A 07/29/2016   Procedure: PELVIC/AORTIC LYMPH NODE SAMPLING;  Surgeon: [name], MD;  Location: [facility] ORS;  Service: Gynecology;  Laterality: N/A;  . PORTA CATH INSERTION N/A 09/22/2016   Procedure: Glori Luis Cath Insertion;  Surgeon: [name], MD;  Location: [place] [facility];  Service: Cardiovascular;  Laterality: N/A;  . PORTA CATH INSERTION N/A 10/27/2019   Procedure: PORTA CATH INSERTION;  Surgeon: [name], MD;  Location: [place] [facility];  Service: Cardiovascular;  Laterality: N/A;  . PORTA CATH REMOVAL N/A 11/17/2016   Procedure: Glori Luis Cath Removal;  Surgeon: [name], MD;  Location: [place] [facility];  Service: Cardiovascular;  Laterality: N/A;  . RIGHT/LEFT HEART CATH AND CORONARY ANGIOGRAPHY N/A 12/04/2019   Procedure: RIGHT/LEFT HEART CATH AND CORONARY ANGIOGRAPHY;  Surgeon: Wellington Hampshire, MD;  Location: [place] [facility];  Service: Cardiovascular;  Laterality: N/A;  . ROBOTIC ASSISTED TOTAL HYSTERECTOMY WITH BILATERAL SALPINGO OOPHERECTOMY  N/A 07/29/2016   Procedure: ROBOTIC ASSISTED TOTAL HYSTERECTOMY WITH BILATERAL SALPINGO OOPHORECTOMY;  Surgeon: [name], MD;  Location: [facility] ORS;  Service: Gynecology;  Laterality: N/A;  . SENTINEL NODE BIOPSY N/A 07/29/2016   Procedure: SENTINEL NODE BIOPSY;  Surgeon: [name], MD;  Location: [facility] OR[facility];  Service: Gynecology;  Laterality: N/A;  . transobturator sling N/A 2009   [place]    SOCIAL HISTORY: Social History   Socioeconomic History  . Marital status: Widowed    Spouse name: Not on file  . Number of children: Not on file  . Years of education: Not on file  . Highest education level: Not on file  Occupational History  . Not on file  Tobacco Use  . Smoking status: Former Smoker    Packs/day: 1.00    Years: 30.00    Pack years: 30.00    Types: Cigarettes    Quit date: 07/24/2002    Years since quitting: 17.5  . Smokeless tobacco: Never Used  . Tobacco comment: quit smoking in 2000  Vaping Use  . Vaping Use: Never used  Substance and Sexual Activity  . Alcohol use: Not Currently  . Drug use: No  . Sexual activity: Never  Other Topics Concern  . 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    Social Determinants of Health   Financial Resource Strain:   .  Difficulty of Paying Living Expenses: Not on file  Food Insecurity:   . Worried About Charity fundraiser in the Last Year: Not on file  . Ran Out of Food in the Last Year: Not on file  Transportation Needs:   . Lack of Transportation (Medical): Not on file  . Lack of Transportation (Non-Medical): Not on file  Physical Activity:   . Days of Exercise per Week: Not on file  . Minutes of Exercise per Session: Not on file  Stress:   . Feeling of Stress : Not on file  Social Connections:   . Frequency of Communication with Friends and Family: Not on file  . Frequency of Social Gatherings with Friends and  Family: Not on file  . Attends Religious Services: Not on file  . Active Member of Clubs or Organizations: Not on file  . Attends Archivist Meetings: Not on file  . Marital Status: Not on file  Intimate Partner Violence:   . Fear of Current or Ex-Partner: Not on file  . Emotionally Abused: Not on file  . Physically Abused: Not on file  . Sexually Abused: Not on file    FAMILY HISTORY: Family History  Problem Relation Age of Onset  . ALS Father   . Polymyositis Father   . Diabetes Brother   . Cancer Maternal Aunt        breast  . Breast cancer Maternal Aunt        30's  . Stroke Maternal Grandmother   . Cancer Maternal Grandfather        prostate  . Stroke Maternal Grandfather   . Colon cancer Maternal Aunt   . Non-Hodgkin's lymphoma Cousin     ALLERGIES:  is allergic to adhesive [tape], antifungal [miconazole nitrate], sulfa antibiotics, and z-pak [azithromycin].  MEDICATIONS:  Current Facility-Administered Medications  Medication Dose Route Frequency Provider Last Rate Last Admin  . 0.9 %  sodium chloride infusion   Intravenous Continuous Agbata, Tochukwu, MD 100 mL/hr at 02/14/20 1407 New Bag at 02/14/20 1407  . acetaminophen (TYLENOL) tablet 650 mg  650 mg Oral Q6H PRN Agbata, Tochukwu, MD       Or  . acetaminophen (TYLENOL) suppository 650 mg  650 mg Rectal Q6H PRN Agbata, Tochukwu, MD      . aspirin EC tablet 81 mg  81 mg Oral QHS Agbata, Tochukwu, MD      . atorvastatin (LIPITOR) tablet 10 mg  10 mg Oral QPM Agbata, Tochukwu, MD   10 mg at 02/14/20 1841  . carvedilol (COREG) tablet 3.125 mg  3.125 mg Oral BID WC Agbata, Tochukwu, MD   3.125 mg at 02/14/20 1619  . cholecalciferol (VITAMIN D3) tablet 1,000 Units  1,000 Units Oral Daily Agbata, Tochukwu, MD   1,000 Units at 02/14/20 1201  . DULoxetine (CYMBALTA) DR capsule 30 mg  30 mg Oral Daily Agbata, Tochukwu, MD   30 mg at 02/14/20 1201  . heparin ADULT infusion 100 units/mL (25000 units/276mL sodium  chloride 0.45%)  800 Units/hr Intravenous Continuous Benita Gutter, RPH 8 mL/hr at 02/14/20 1924 800 Units/hr at 02/14/20 1924  . lenvatinib 10 mg daily dose (LENVIMA) capsule 10 mg  10 mg Oral Daily Agbata, Tochukwu, MD      . Derrill Memo ON 02/15/2020] levothyroxine (SYNTHROID) tablet 50 mcg  50 mcg Oral QAC breakfast Agbata, Tochukwu, MD      . LORazepam (ATIVAN) tablet 0.5 mg  0.5 mg Oral Q6H PRN Agbata, Tochukwu, MD      .  nystatin (MYCOSTATIN/NYSTOP) topical powder 1 application  1 application Topical QID Agbata, Tochukwu, MD   1 application at 68/11/57 1842  . ondansetron (ZOFRAN) tablet 4 mg  4 mg Oral Q6H PRN Agbata, Tochukwu, MD       Or  . ondansetron (ZOFRAN) injection 4 mg  4 mg Intravenous Q6H PRN Agbata, Tochukwu, MD      . pantoprazole (PROTONIX) EC tablet 20 mg  20 mg Oral Daily Agbata, Tochukwu, MD   20 mg at 02/14/20 1202   Current Outpatient Medications  Medication Sig Dispense Refill  . aspirin EC 81 MG tablet Take 81 mg by mouth at bedtime.    Marland Kitchen atorvastatin (LIPITOR) 10 MG tablet Take 1 tablet by mouth once daily (Patient taking differently: Take 10 mg by mouth every evening. ) 90 tablet 2  . carvedilol (COREG) 3.125 MG tablet Take 1 tablet (3.125 mg total) by mouth 2 (two) times daily with a meal. 60 tablet 2  . cholecalciferol (VITAMIN D3) 25 MCG (1000 UNIT) tablet Take 1,000 Units by mouth in the morning and at bedtime.    . cyanocobalamin (,VITAMIN B-12,) 1000 MCG/ML injection INJECT 1ML IM WEEKLY FOR 4 WEEKS, THEN INJECT MONTHLY THEREAFTER (Patient taking differently: Inject 1,000 mcg into the skin every 30 (thirty) days. ) 10 mL 0  . DULoxetine (CYMBALTA) 30 MG capsule Take 1 capsule (30 mg total) by mouth daily. 90 capsule 2  . furosemide (LASIX) 20 MG tablet Take 1 tablet (20 mg total) by mouth as needed for fluid or edema (Take one tablet daily as needed for lower extremity edema or for weight gain of 2 lbs overnight or 5 lbs in one week.). (Patient taking differently:  Take 20 mg by mouth daily. ) 30 tablet 2  . lenvatinib 10 mg daily dose (LENVIMA, 10 MG DAILY DOSE,) capsule Take 1 capsule (10 mg total) by mouth daily. (Patient taking differently: Take 10 mg by mouth every evening. ) 30 capsule 1  . levothyroxine (SYNTHROID) 50 MCG tablet Take 1 tablet (50 mcg total) by mouth daily before breakfast. 30 tablet 3  . LORazepam (ATIVAN) 0.5 MG tablet Take 1 tablet (0.5 mg total) by mouth every 6 (six) hours as needed for anxiety (and to help with breathing). 120 tablet 0  . losartan (COZAAR) 25 MG tablet Take 0.5 tablets (12.5 mg total) by mouth daily. 15 tablet 2  . nystatin (MYCOSTATIN/NYSTOP) powder Apply 1 application topically 4 (four) times daily. To affect areas for yeast infection of the skin. 60 g 2  . pantoprazole (PROTONIX) 20 MG tablet Take 1 tablet (20 mg total) by mouth daily. 60 tablet 1   Facility-Administered Medications Ordered in Other Encounters  Medication Dose Route Frequency Provider Last Rate Last Admin  . heparin lock flush 100 unit/mL  500 Units Intravenous Once Sindy Guadeloupe, MD      . sodium chloride flush (NS) 0.9 % injection 10 mL  10 mL Intravenous PRN Sindy Guadeloupe, MD   10 mL at 12/07/19 0914  . sodium chloride flush (NS) 0.9 % injection 10 mL  10 mL Intravenous PRN Sindy Guadeloupe, MD   10 mL at 01/04/20 0850      .  PHYSICAL EXAMINATION:  Vitals:   02/14/20 1930 02/14/20 2000  BP: (!) 104/48 105/63  Pulse: 74 76  Resp: 19   Temp:    SpO2: 95% 98%   Filed Weights   02/13/20 1652  Weight: 154 lb (69.9 kg)  Physical Exam Constitutional:      Comments: Appears weak.  Resting comfortably in the stretcher.  On room air.  HENT:     Head: Normocephalic and atraumatic.     Mouth/Throat:     Pharynx: No oropharyngeal exudate.  Eyes:     Pupils: Pupils are equal, round, and reactive to light.  Cardiovascular:     Rate and Rhythm: Normal rate and regular rhythm.  Pulmonary:     Effort: No respiratory distress.      Breath sounds: No wheezing.     Comments: Mild basilar crackles noted. Abdominal:     General: Bowel sounds are normal. There is no distension.     Palpations: Abdomen is soft. There is no mass.     Tenderness: There is no abdominal tenderness. There is no guarding or rebound.  Musculoskeletal:        General: No tenderness. Normal range of motion.     Cervical back: Normal range of motion and neck supple.  Skin:    General: Skin is warm.  Neurological:     Mental Status: She is alert and oriented to person, place, and time.  Psychiatric:        Mood and Affect: Affect normal.      LABORATORY DATA:  I have reviewed the data as listed Lab Results  Component Value Date   WBC 4.4 02/14/2020   HGB 11.3 (L) 02/14/2020   HCT 33.9 (L) 02/14/2020   MCV 93.9 02/14/2020   PLT 106 (L) 02/14/2020   Recent Labs    01/11/20 0823 02/01/20 1022 02/13/20 1659  NA 139 141 141  K 4.1 3.4* 3.5  CL 107 103 102  CO2 25 28 25   GLUCOSE 88 127* 114*  BUN 20 18 19   CREATININE 1.26* 1.54* 1.27*  CALCIUM 8.9 8.4* 8.9  GFRNONAA 43* 34* 43*  GFRAA 50* 39* 50*  PROT 6.1* 6.8 6.8  ALBUMIN 3.5 3.7 3.8  AST 28 29 27   ALT 30 15 14   ALKPHOS 89 80 67  BILITOT 0.9 1.8* 2.8*    RADIOGRAPHIC STUDIES: I have personally reviewed the radiological images as listed and agreed with the findings in the report. DG Chest 2 View  Result Date: 02/13/2020 CLINICAL DATA:  Shortness of breath. On chemotherapy for endometrial cancer. EXAM: CHEST - 2 VIEW COMPARISON:  12/29/2019 FINDINGS: Midline trachea. Normal heart size. Right sided central line terminates at the caval atrial junction. Paramediastinal radiation fibrosis with architectural distortion, similar. No pleural effusion or pneumothorax. Mild right hemidiaphragm elevation. Basilar predominant interstitial thickening, likely related to COPD/chronic bronchitis. No lobar consolidation. Right upper quadrant surgical clips. IMPRESSION: No acute  cardiopulmonary disease. Electronically Signed   By: Abigail Miyamoto M.D.   On: 02/13/2020 17:29   CT Chest W Contrast  Result Date: 01/16/2020 CLINICAL DATA:  Restaging recurrent endometrial cancer, peritoneal carcinomatosis, assess treatment response, chemotherapy, additional remote history of Hodgkin's lymphoma EXAM: CT CHEST, ABDOMEN, AND PELVIS WITH CONTRAST TECHNIQUE: Multidetector CT imaging of the chest, abdomen and pelvis was performed following the standard protocol during bolus administration of intravenous contrast. CONTRAST:  17mL OMNIPAQUE IOHEXOL 300 MG/ML SOLN, additional oral enteric contrast COMPARISON:  CT chest angiogram, 12/29/2019, CT abdomen pelvis, 10/16/2019 FINDINGS: CT CHEST FINDINGS Cardiovascular: Right chest port catheter. Aortic atherosclerosis. Normal heart size. LAD stent. No pericardial effusion. Mediastinum/Nodes: No enlarged mediastinal, hilar, or axillary lymph nodes. Thyroid gland, trachea, and esophagus demonstrate no significant findings. 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 No  pleural effusion or pneumothorax. Musculoskeletal: No chest wall mass or suspicious bone lesions identified. CT ABDOMEN PELVIS FINDINGS Hepatobiliary: No focal liver abnormality is seen. Hepatic steatosis. Status post cholecystectomy. Postoperative biliary dilatation. Pancreas: Unremarkable. No pancreatic ductal dilatation or surrounding inflammatory changes. Spleen: Normal in size without significant abnormality. Adrenals/Urinary Tract: Adrenal glands are unremarkable. Kidneys are normal, without renal calculi, solid lesion, or hydronephrosis. Bladder is unremarkable. Stomach/Bowel: Stomach is within normal limits. Appendix is very diminutive. No evidence of bowel wall thickening, distention, or inflammatory changes. Sigmoid diverticulosis. Vascular/Lymphatic: Aortic atherosclerosis. No enlarged abdominal or pelvic lymph nodes. Reproductive: Status post  hysterectomy. Previously seen soft tissue thickening at the right aspect of the low right hemipelvis is essentially resolved (series 2, image 98). Other: No abdominal wall hernia or abnormality. Significant interval reduction in size of multiple peritoneal or omental nodules in the anterior right hemiabdomen, the largest nodule measuring 3.6 x 2.7 cm, previously 5.7 x 4.7 cm when measured similarly (series 2, image 74). There is however a new peritoneal or omental nodule in the anterior left hemiabdomen measuring 1.0 x 0.8 cm (series 2, image 84). Musculoskeletal: No acute or significant osseous findings. Redemonstrated sclerotic bilateral sacral alar insufficiency fractures. IMPRESSION: 1. Significant interval reduction in size of multiple peritoneal or omental nodules in the anterior right hemiabdomen as well as soft tissue in the low right hemipelvis, consistent with treatment response. 2. There is however a new peritoneal or omental nodule in the anterior left hemiabdomen, concerning for mixed response to treatment. 3. Status post hysterectomy. 4. No evidence of metastatic disease in the chest. 5. 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 6. Hepatic steatosis. 7. Coronary artery disease.  Aortic Atherosclerosis (ICD10-I70.0). Electronically Signed   By: Eddie Candle M.D.   On: 01/16/2020 16:01   CT Angio Chest PE W and/or Wo Contrast  Result Date: 02/14/2020 CLINICAL DATA:  High probability for pulmonary embolism. Shortness of breath. On chemotherapy for endometrial cancer. EXAM: CT ANGIOGRAPHY CHEST WITH CONTRAST TECHNIQUE: Multidetector CT imaging of the chest was performed using the standard protocol during bolus administration of intravenous contrast. Multiplanar CT image reconstructions and MIPs were obtained to evaluate the vascular anatomy. CONTRAST:  72mL OMNIPAQUE IOHEXOL 350 MG/ML SOLN  COMPARISON:  01/16/2020 chest CT FINDINGS: Cardiovascular: Cardiomegaly, most notably left heart dilatation. Atherosclerotic calcification and LAD stenting. Branching, central pulmonary artery filling defects in lateral and posterior segments of the right lower lobe. Porta catheter on the right. Mediastinum/Nodes: No adenopathy Lungs/Pleura: Bilateral perihilar radiation fibrosis. Mild ground-glass density asymmetric to the right base. Upper Abdomen: No acute finding.  Cholecystectomy. Musculoskeletal: No acute or aggressive finding. Review of the MIP images confirms the above findings. Critical Value/emergent results were called by telephone at the time of interpretation on 02/14/2020 at 6:28 am to provider [facility] , who verbally acknowledged these results. IMPRESSION: 1. Acute segmental emboli to the right lower lobe. 2. Mild ground-glass opacity in the right lower lobe, unlikely to be ischemic given the nonocclusive clot and patchy pattern. Please correlate for infectious symptoms. Electronically Signed   By: Monte Fantasia M.D.   On: 02/14/2020 06:30   CT Abdomen Pelvis W Contrast  Result Date: 01/16/2020 CLINICAL DATA:  Restaging recurrent endometrial cancer, peritoneal carcinomatosis, assess treatment response, chemotherapy, additional remote history of Hodgkin's lymphoma EXAM: CT CHEST, ABDOMEN, AND PELVIS WITH CONTRAST TECHNIQUE: Multidetector CT imaging of the chest, abdomen and pelvis was performed following the  standard protocol during bolus administration of intravenous contrast. CONTRAST:  10mL OMNIPAQUE IOHEXOL 300 MG/ML SOLN, additional oral enteric contrast COMPARISON:  CT chest angiogram, 12/29/2019, CT abdomen pelvis, 10/16/2019 FINDINGS: CT CHEST FINDINGS Cardiovascular: Right chest port catheter. Aortic atherosclerosis. Normal heart size. LAD stent. No pericardial effusion. Mediastinum/Nodes: No enlarged mediastinal, hilar, or axillary lymph nodes. Thyroid gland, trachea, and esophagus  demonstrate no significant findings. 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 No pleural effusion or pneumothorax. Musculoskeletal: No chest wall mass or suspicious bone lesions identified. CT ABDOMEN PELVIS FINDINGS Hepatobiliary: No focal liver abnormality is seen. Hepatic steatosis. Status post cholecystectomy. Postoperative biliary dilatation. Pancreas: Unremarkable. No pancreatic ductal dilatation or surrounding inflammatory changes. Spleen: Normal in size without significant abnormality. Adrenals/Urinary Tract: Adrenal glands are unremarkable. Kidneys are normal, without renal calculi, solid lesion, or hydronephrosis. Bladder is unremarkable. Stomach/Bowel: Stomach is within normal limits. Appendix is very diminutive. No evidence of bowel wall thickening, distention, or inflammatory changes. Sigmoid diverticulosis. Vascular/Lymphatic: Aortic atherosclerosis. No enlarged abdominal or pelvic lymph nodes. Reproductive: Status post hysterectomy. Previously seen soft tissue thickening at the right aspect of the low right hemipelvis is essentially resolved (series 2, image 98). Other: No abdominal wall hernia or abnormality. Significant interval reduction in size of multiple peritoneal or omental nodules in the anterior right hemiabdomen, the largest nodule measuring 3.6 x 2.7 cm, previously 5.7 x 4.7 cm when measured similarly (series 2, image 74). There is however a new peritoneal or omental nodule in the anterior left hemiabdomen measuring 1.0 x 0.8 cm (series 2, image 84). Musculoskeletal: No acute or significant osseous findings. Redemonstrated sclerotic bilateral sacral alar insufficiency fractures. IMPRESSION: 1. Significant interval reduction in size of multiple peritoneal or omental nodules in the anterior right hemiabdomen as well as soft tissue in the low right hemipelvis, consistent with treatment response. 2. There is however a new peritoneal or omental nodule  in the anterior left hemiabdomen, concerning for mixed response to treatment. 3. Status post hysterectomy. 4. No evidence of metastatic disease in the chest. 5. 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 6. Hepatic steatosis. 7. Coronary artery disease.  Aortic Atherosclerosis (ICD10-I70.0). Electronically Signed   By: Eddie Candle M.D.   On: 01/16/2020 16:01    Acute pulmonary embolism [facility]) # 69 year old female patient with multiple medical problems including congestive heart failure/metastatic endometrial cancer currently admitted to hospital for worsening shortness of breath/generalized weakness; CT scan suggestive of acute right segmental pulmonary embolism  #Acute worsening shortness of breath-appears multifactorial-acute right segmental pulmonary embolism/also underlying congestive heart failure-currently on IV heparin.  #Endometrial cancer metastatic/peritoneal carcinomatosis currently on lenvatinib plus Keytruda status post cycle #2-early to assess response.  #Extreme fatigue-likely secondary to underlying malignancy/acute illness.  Do not suspect any endocrinopathy from [facility].  Recommendation:  #Agree with IV heparin; given the relatively low volume PE-I think is reasonable to start patient on Eliquis starting September 2 [Eliquis p.o. 10 mg twice daily for 1 week; then 5 mg twice daily].  Patient will likely need indefinite anticoagulation/given her underlying metastatic malignancy.   #Hold lenvatinib in hospital/with acute issues.  #  Given the extreme fatigue I would recommend checking-thyroid work-up; a.m. cortisol.   Thank you Dr.Agbata for allowing me to participate in the care of your pleasant patient. Please do not hesitate to contact me with questions or concerns in the interim. Dr.Rao to resume care starting 02/15/2020.  As per the patient's request-I  have left a  message for the patient's granddaughter Gerrit Friends an update of patient's clinical status.   All questions were answered. The patient knows to call the clinic with any problems, questions or concerns.    Cammie Sickle, MD 02/14/2020 8:16 PM

## 2020-02-14 NOTE — ED Provider Notes (Signed)
[facility] Emergency Department Provider Note  ____________________________________________   First MD Initiated Contact with Patient 02/14/20 [phone]     (approximate)  I have reviewed the triage vital signs and the nursing notes.   HISTORY  Chief Complaint Shortness of Breath    HPI Jaime Hall is a 69 y.o. female with below list of previous medical conditions including congestive heart failure Hodgkin's lymphoma, endometrial cancer currently receiving no therapy presents to the emergency department secondary to persistent dyspnea, fatigue and generalized weakness over the past few weeks.  Patient was seen here at [facility] and subsequently had a hospital in [facility] secondary to the same.  Patient has had follow-up appointments with cardiology most recently 02/05/2020.  Patient does admit to right chest discomfort.  Patient denies any lower extremity pain or swelling.  Patient denies any weight gain cardiology visit from 02/05/2020 revealed a weight of 70 kg weight today 69 kg.        Past Medical History:  Diagnosis Date  . Anxiety   . Cervicalgia   . CHF (congestive heart failure) ([place])   . Chronic kidney disease   . Coronary artery disease    a. 02/2012 Stress echo: severe anterior wall ischemia;  b. 02/2012 Cath/PCI: LAD 95p (3.0 x 15 Xience EX DES), D1 90ost (PTCA - bifurcational dzs), EF 45% with anterior HK;  b. 02/2013 Ex MV: fixed anterior defect w/ minor reversibility, nl EF-->Med Rx.  . Endometrial cancer ([place])    a. 07/2016 s/p robotic hysterectomy, BSO w/ washings, sentinel node inj, mapping, bx, adhesiolysis.  . Essential hypertension, benign   . Fibrocystic breast disease   . GERD (gastroesophageal reflux disease)   . Gestational hypertension   . Heart murmur   . History of anemia   . History of blood transfusion   . Hodgkin's lymphoma ([place]) 2011   a. s/p radiation and chemo therapy  . Osteoarthritis   . Polycystic  ovarian disease     Patient Active Problem List   Diagnosis Date Noted  . Acute pulmonary embolism ([place]) 02/14/2020  . Chest pain 12/29/2019  . Leukocytosis 12/29/2019  . HTN (hypertension) 12/29/2019  . HLD (hyperlipidemia) 12/29/2019  . Elevated troponin 12/29/2019  . CKD (chronic kidney disease), stage IIIa 12/29/2019  . Depression with anxiety 12/29/2019  . Abnormal cardiovascular stress test   . Acute on chronic combined systolic and diastolic CHF (congestive heart failure) ([place])   . Dyspnea on exertion   . Goals of care, counseling/discussion 10/20/2019  . 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 Peritoneal metastases ([place]) 10/16/2019  . Flank pain 10/04/2019  . Right lower quadrant abdominal pain 10/04/2019  . Candidiasis 10/04/2019  . Right anterior knee pain 07/25/2019  . Educated about COVID-19 virus infection 11/15/2018  . Cramps, muscle, general 04/06/2018  . Chronic kidney disease, stage 2, mildly decreased GFR 10/05/2017  . B12 deficiency anemia 07/24/2017  . Normocytic anemia 07/18/2017  . Back pain 07/18/2017  . Chemotherapy-induced peripheral neuropathy ([place]) 12/10/2016  . Hyperlipidemia 08/13/2016  . Hx of colonic polyps 08/13/2016  . Benign neoplasm of sigmoid colon 08/13/2016  . Endometrial cancer ([place]) 08/13/2016  . 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 Lymphedema 07/20/2016  . Chronic venous insufficiency 07/20/2016  . PAD (peripheral artery disease) ([place]) 07/20/2016  . Postmenopausal bleeding 07/14/2016  . Class 1 obesity due to excess calories without serious comorbidity with body mass index (BMI) of 34.0 to 34.9 in adult  05/27/2016  . Leg cramps 05/27/2016  . Bronchiectasis ([place]) 12/19/2015  . Pre-syncope 12/19/2015  . Prolapsed, uterovaginal, incomplete 06/24/2014  . Routine general medical examination at a health care facility 12/30/2012  . Obesity (BMI 30-39.9) 12/30/2012  . Hx of multiple pulmonary nodules  12/28/2012  . Plantar fasciitis of right foot 12/28/2012  . Neuropathy associated with lymphoma ([place]) 09/12/2012  . Hip pain, bilateral 09/12/2012  . History of Hodgkin's lymphoma 09/12/2012  . Coronary artery disease   . Chest pain on exertion 02/29/2012    Past Surgical History:  Procedure Laterality Date  . ABDOMINAL HYSTERECTOMY    . bladder sling    . CARDIAC CATHETERIZATION  02/2012   [facility] 1 stent place  . CERVICAL POLYPECTOMY    . CHOLECYSTECTOMY  1982  . COLONOSCOPY WITH PROPOFOL N/A 02/05/2015   Procedure: COLONOSCOPY WITH PROPOFOL;  Surgeon: Lucilla Lame, MD;  Location: [facility] ENDOSCOPY;  Service: Endoscopy;  Laterality: N/A;  . CORONARY ANGIOPLASTY  02/2012   left/right s/p balloon  . CYSTOGRAM N/A 08/17/2016   Procedure: CYSTOGRAM;  Surgeon: Hollice Espy, MD;  Location: [facility] ORS;  Service: Urology;  Laterality: N/A;  . CYSTOSCOPY N/A 08/17/2016   Procedure: CYSTOSCOPY EXAM UNDER ANESTHESIA;  Surgeon: Hollice Espy, MD;  Location: [facility] ORS;  Service: Urology;  Laterality: N/A;  . CYSTOSCOPY W/ RETROGRADES Bilateral 08/17/2016   Procedure: CYSTOSCOPY WITH RETROGRADE PYELOGRAM;  Surgeon: Hollice Espy, MD;  Location: [facility] ORS;  Service: Urology;  Laterality: Bilateral;  . CYSTOSCOPY WITH STENT PLACEMENT Right 08/17/2016   Procedure: CYSTOSCOPY WITH STENT PLACEMENT;  Surgeon: Hollice Espy, MD;  Location: [facility] ORS;  Service: Urology;  Laterality: Right;  . heart stent'  2013  . kidney stent Right 2018  . LYMPH NODE BIOPSY  2011   diagnosis of hodgkins lymphoma  . PELVIC LYMPH NODE DISSECTION N/A 07/29/2016   Procedure: PELVIC/AORTIC LYMPH NODE SAMPLING;  Surgeon: [name], MD;  Location: [facility] ORS;  Service: Gynecology;  Laterality: N/A;  . PORTA CATH INSERTION N/A 09/22/2016   Procedure: Glori Luis Cath Insertion;  Surgeon: [name], MD;  Location: [place] [facility];  Service: Cardiovascular;  Laterality: N/A;  . PORTA CATH INSERTION N/A 10/27/2019   Procedure:  PORTA CATH INSERTION;  Surgeon: [name], MD;  Location: [place] [facility];  Service: Cardiovascular;  Laterality: N/A;  . PORTA CATH REMOVAL N/A 11/17/2016   Procedure: Glori Luis Cath Removal;  Surgeon: [name], MD;  Location: [place] [facility];  Service: Cardiovascular;  Laterality: N/A;  . RIGHT/LEFT HEART CATH AND CORONARY ANGIOGRAPHY N/A 12/04/2019   Procedure: RIGHT/LEFT HEART CATH AND CORONARY ANGIOGRAPHY;  Surgeon: Wellington Hampshire, MD;  Location: [place] [facility];  Service: Cardiovascular;  Laterality: N/A;  . ROBOTIC ASSISTED TOTAL HYSTERECTOMY WITH BILATERAL SALPINGO OOPHERECTOMY N/A 07/29/2016   Procedure: ROBOTIC ASSISTED TOTAL HYSTERECTOMY WITH BILATERAL SALPINGO OOPHORECTOMY;  Surgeon: [name], MD;  Location: [facility] ORS;  Service: Gynecology;  Laterality: N/A;  . SENTINEL NODE BIOPSY N/A 07/29/2016   Procedure: SENTINEL NODE BIOPSY;  Surgeon: [name], MD;  Location: [facility] ORS;  Service: Gynecology;  Laterality: N/A;  . transobturator sling N/A 2009   [place]    Prior to Admission medications   Medication Sig Start Date End Date Taking? Authorizing Provider  aspirin EC 81 MG tablet Take 81 mg by mouth at bedtime.    [provider]  atorvastatin (LIPITOR) 10 MG tablet Take 1 tablet by mouth once daily Patient taking  differently: Take 10 mg by mouth every evening.  10/23/19   Wellington Hampshire, MD  carvedilol (COREG) 3.125 MG tablet Take 1 tablet (3.125 mg total) by mouth 2 (two) times daily with a meal. 02/05/20   Loel Dubonnet, NP  cholecalciferol (VITAMIN D3) 25 MCG (1000 UNIT) tablet Take 1,000 Units by mouth in the morning and at bedtime.    [provider]  cyanocobalamin (,VITAMIN B-12,) 1000 MCG/ML injection INJECT 1ML IM WEEKLY FOR 4 WEEKS, THEN INJECT MONTHLY THEREAFTER Patient taking differently: Inject 1,000 mcg into the skin every 30 (thirty) days.  09/05/19   [name], MD  DULoxetine  (CYMBALTA) 30 MG capsule Take 1 capsule (30 mg total) by mouth daily. 02/01/20   Sindy Guadeloupe, MD  furosemide (LASIX) 20 MG tablet Take 1 tablet (20 mg total) by mouth as needed for fluid or edema (Take one tablet daily as needed for lower extremity edema or for weight gain of 2 lbs overnight or 5 lbs in one week.). Patient taking differently: Take 20 mg by mouth daily.  12/20/19   Loel Dubonnet, NP  lenvatinib 10 mg daily dose (LENVIMA, 10 MG DAILY DOSE,) capsule Take 1 capsule (10 mg total) by mouth daily. 02/01/20   Sindy Guadeloupe, MD  levothyroxine (SYNTHROID) 50 MCG tablet Take 1 tablet (50 mcg total) by mouth daily before breakfast. 01/17/20   Cammie Sickle, MD  LORazepam (ATIVAN) 0.5 MG tablet Take 1 tablet (0.5 mg total) by mouth every 6 (six) hours as needed for anxiety (and to help with breathing). 02/01/20   Sindy Guadeloupe, MD  losartan (COZAAR) 25 MG tablet Take 0.5 tablets (12.5 mg total) by mouth daily. 02/05/20 05/05/20  Loel Dubonnet, NP  nystatin (MYCOSTATIN/NYSTOP) powder Apply 1 application topically 4 (four) times daily. To affect areas for yeast infection of the skin. 02/07/20   Verlon Au, NP  pantoprazole (PROTONIX) 20 MG tablet Take 1 tablet (20 mg total) by mouth daily. 12/20/19   Jacquelin Hawking, NP  prochlorperazine (COMPAZINE) 10 MG tablet Take 1 tablet (10 mg total) by mouth every 6 (six) hours as needed (Nausea or vomiting). Patient taking differently: Take 10 mg by mouth daily as needed for nausea or vomiting.  10/25/19 01/04/20  Sindy Guadeloupe, MD    Allergies Adhesive [tape], Antifungal [miconazole nitrate], Sulfa antibiotics, and Z-pak [azithromycin]  Family History  Problem Relation Age of Onset  . ALS Father   . Polymyositis Father   . Diabetes Brother   . Cancer Maternal Aunt        breast  . Breast cancer Maternal Aunt        30's  . Stroke Maternal Grandmother   . Cancer Maternal Grandfather        prostate  . Stroke Maternal Grandfather    . Colon cancer Maternal Aunt   . Non-Hodgkin's lymphoma Cousin     Social History Social History   Tobacco Use  . Smoking status: Former Smoker    Packs/day: 1.00    Years: 30.00    Pack years: 30.00    Types: Cigarettes    Quit date: 07/24/2002    Years since quitting: 17.5  . Smokeless tobacco: Never Used  . Tobacco comment: quit smoking in 2000  Vaping Use  . Vaping Use: Never used  Substance Use Topics  . Alcohol use: Not Currently  . Drug use: No    Review of Systems Constitutional: No fever/chills Eyes:  No visual changes. ENT: No sore throat. Cardiovascular: Positive for chest pain. Respiratory: Positive for shortness of breath. Gastrointestinal: No abdominal pain.  No nausea, no vomiting.  No diarrhea.  No constipation. Genitourinary: Negative for dysuria. Musculoskeletal: Negative for neck pain.  Negative for back pain. Integumentary: Negative for rash. Neurological: Negative for headaches, focal weakness or numbness.   ____________________________________________   PHYSICAL EXAM:  VITAL SIGNS: ED Triage Vitals  Enc Vitals Group     BP 02/13/20 1651 129/63     Pulse Rate 02/13/20 1651 99     Resp 02/13/20 1651 (!) 22     Temp 02/13/20 1651 98 F (36.7 C)     Temp Source 02/13/20 1651 Axillary     SpO2 02/13/20 1651 97 %     Weight 02/13/20 1652 69.9 kg (154 lb)     Height 02/13/20 1652 1.549 m (5\' 1" )     Head Circumference --      Peak Flow --      Pain Score 02/13/20 1652 0     Pain Loc --      Pain Edu? --      Excl. in [place]? --     Constitutional: Alert and oriented.  Eyes: Conjunctivae are normal.  Head: Atraumatic. Mouth/Throat: Patient is wearing a mask. Neck: No stridor.  No meningeal signs.   Cardiovascular: Normal rate, regular rhythm. Good peripheral circulation. Grossly normal heart sounds. Respiratory: Normal respiratory effort.  No retractions. Gastrointestinal: Soft and nontender. No distention.  Musculoskeletal: No lower  extremity tenderness nor edema. No gross deformities of extremities. Neurologic:  Normal speech and language. No gross focal neurologic deficits are appreciated.  Skin:  Skin is warm, dry and intact. Psychiatric: Mood and affect are normal. Speech and behavior are normal.  ____________________________________________   LABS (all labs ordered are listed, but only abnormal results are displayed)  Labs Reviewed  CBC - Abnormal; Notable for the following components:      Result Value   RDW 17.8 (*)    Platelets 127 (*)    All other components within normal limits  BRAIN NATRIURETIC PEPTIDE - Abnormal; Notable for the following components:   B Natriuretic Peptide 2,108.1 (*)    All other components within normal limits  COMPREHENSIVE METABOLIC PANEL - Abnormal; Notable for the following components:   Glucose, Bld 114 (*)    Creatinine, Ser 1.27 (*)    Total Bilirubin 2.8 (*)    GFR calc non Af Amer 43 (*)    GFR calc Af Amer 50 (*)    All other components within normal limits  MAGNESIUM - Abnormal; Notable for the following components:   Magnesium 1.3 (*)    All other components within normal limits  SARS CORONAVIRUS 2 BY RT PCR (HOSPITAL ORDER, [place] [facility])  APTT  PROTIME-INR  HEPARIN LEVEL (UNFRACTIONATED)  TROPONIN I (HIGH SENSITIVITY)   ____________________________________________  EKG  ED ECG REPORT I, Cobalt N Kinzleigh Kandler, the attending physician, personally viewed and interpreted this ECG.   Date: 02/13/2020  EKG Time: 5:31 PM  Rate: 93   Rhythm: Normal sinus rhythm  Axis: Normal  Intervals: Normal  ST&T Change: None   RADIOLOGY I, Washta N Ainsley Deakins, personally viewed and evaluated these images (plain radiographs) as part of my medical decision making, as well as reviewing the written report by the radiologist.  ED MD interpretation: CT chest revealed acute segmental emboli in the right lower lobe mild groundglass opacities in the right  lower lobe per radiologist on CT impression.  Official radiology report(s): DG Chest 2 View  Result Date: 02/13/2020 CLINICAL DATA:  Shortness of breath. On chemotherapy for endometrial cancer. EXAM: CHEST - 2 VIEW COMPARISON:  12/29/2019 FINDINGS: Midline trachea. Normal heart size. Right sided central line terminates at the caval atrial junction. Paramediastinal radiation fibrosis with architectural distortion, similar. No pleural effusion or pneumothorax. Mild right hemidiaphragm elevation. Basilar predominant interstitial thickening, likely related to COPD/chronic bronchitis. No lobar consolidation. Right upper quadrant surgical clips. IMPRESSION: No acute cardiopulmonary disease. Electronically Signed   By: Abigail Miyamoto M.D.   On: 02/13/2020 17:29   CT Angio Chest PE W and/or Wo Contrast  Result Date: 02/14/2020 CLINICAL DATA:  High probability for pulmonary embolism. Shortness of breath. On chemotherapy for endometrial cancer. EXAM: CT ANGIOGRAPHY CHEST WITH CONTRAST TECHNIQUE: Multidetector CT imaging of the chest was performed using the standard protocol during bolus administration of intravenous contrast. Multiplanar CT image reconstructions and MIPs were obtained to evaluate the vascular anatomy. CONTRAST:  48mL OMNIPAQUE IOHEXOL 350 MG/ML SOLN COMPARISON:  01/16/2020 chest CT FINDINGS: Cardiovascular: Cardiomegaly, most notably left heart dilatation. Atherosclerotic calcification and LAD stenting. Branching, central pulmonary artery filling defects in lateral and posterior segments of the right lower lobe. Porta catheter on the right. Mediastinum/Nodes: No adenopathy Lungs/Pleura: Bilateral perihilar radiation fibrosis. Mild ground-glass density asymmetric to the right base. Upper Abdomen: No acute finding.  Cholecystectomy. Musculoskeletal: No acute or aggressive finding. Review of the MIP images confirms the above findings. Critical Value/emergent results were called by telephone at the time of  interpretation on 02/14/2020 at 6:28 am to provider [facility] , who verbally acknowledged these results. IMPRESSION: 1. Acute segmental emboli to the right lower lobe. 2. Mild ground-glass opacity in the right lower lobe, unlikely to be ischemic given the nonocclusive clot and patchy pattern. Please correlate for infectious symptoms. Electronically Signed   By: Monte Fantasia M.D.   On: 02/14/2020 06:30     Procedures   ____________________________________________   INITIAL IMPRESSION / MDM / ASSESSMENT AND PLAN / ED COURSE  As part of my medical decision making, I reviewed the following data within the electronic MEDICAL RECORD NUMBER 69 year old female presenting with above-stated history and physical exam a differential diagnosis including but not limited to pulmonary emboli, pneumonia, less likely CHF exacerbation.  Laboratory data notable for creatinine of 1.27 BNP of 2108.  CT chest did reveal evidence of a right lower lobe segmental pulmonary emboli.  As such heparin protocol initiated.  Patient discussed with Dr. Gayla Medicus for hospital admission further evaluation and management.  __________________________  FINAL CLINICAL IMPRESSION(S) / ED DIAGNOSES  Final diagnoses:  Acute pulmonary embolism without acute cor pulmonale, unspecified pulmonary embolism type ([place])     MEDICATIONS GIVEN DURING THIS VISIT:  Medications  heparin bolus via infusion 5,000 Units (has no administration in time range)    Followed by  heparin ADULT infusion 100 units/mL (25000 units/281mL sodium chloride 0.45%) (has no administration in time range)  iohexol (OMNIPAQUE) 350 MG/ML injection 60 mL (60 mLs Intravenous Contrast Given 02/14/20 0558)     ED Discharge Orders    None      *Please note:  TAMAKA SAWIN was evaluated in Emergency Department on 02/14/2020 for the symptoms described in the history of present illness. She was evaluated in the context of the global COVID-19 pandemic, which  necessitated consideration that the patient might be at risk for infection with the SARS-CoV-2 virus that causes COVID-19.  Institutional protocols and algorithms that pertain to the evaluation of patients at risk for COVID-19 are in a state of rapid change based on information released by regulatory bodies including the CDC and federal and state organizations. These policies and algorithms were followed during the patient's care in the ED.  Some ED evaluations and interventions may be delayed as a result of limited staffing during and after the pandemic.*  Note:  This document was prepared using Dragon voice recognition software and may include unintentional dictation errors.   Gregor Hams, MD 02/14/20 [phone]

## 2020-02-14 NOTE — Progress Notes (Signed)
[place] for Heparin Indication: pulmonary embolus  Allergies  Allergen Reactions  . Adhesive [Tape] Other (See Comments)    Skin Irritation   . Antifungal [Miconazole Nitrate] Rash  . Sulfa Antibiotics Nausea And Vomiting and Rash  . Z-Pak [Azithromycin] Itching    Patient Measurements: Height: 5\' 1"  (154.9 cm) Weight: 69.9 kg (154 lb) IBW/kg (Calculated) : 47.8 HEPARIN DW (KG): 62.8  Vital Signs: BP: 118/63 (09/01 1619) Pulse Rate: 78 (09/01 1619)  Labs: Recent Labs    02/13/20 1659 02/14/20 0452 02/14/20 0729 02/14/20 1200 02/14/20 1439  HGB 12.5  --   --   --  11.3*  HCT 38.2  --   --   --  33.9*  PLT 127*  --   --   --  106*  APTT  --   --  61*  --   --   LABPROT  --   --  13.4  --   --   INR  --   --  1.1  --   --   HEPARINUNFRC  --   --   --   --  1.84*  CREATININE 1.27*  --   --   --   --   TROPONINIHS  --  25*  --  26*  --    Estimated Creatinine Clearance: 36.8 mL/min (A) (by C-G formula based on SCr of 1.27 mg/dL (H)).  Medical History: Past Medical History:  Diagnosis Date  . Anxiety   . Cervicalgia   . CHF (congestive heart failure) ([place])   . Chronic kidney disease   . Coronary artery disease    a. 02/2012 Stress echo: severe anterior wall ischemia;  b. 02/2012 Cath/PCI: LAD 95p (3.0 x 15 Xience EX DES), D1 90ost (PTCA - bifurcational dzs), EF 45% with anterior HK;  b. 02/2013 Ex MV: fixed anterior defect w/ minor reversibility, nl EF-->Med Rx.  . Endometrial cancer ([place])    a. 07/2016 s/p robotic hysterectomy, BSO w/ washings, sentinel node inj, mapping, bx, adhesiolysis.  . Essential hypertension, benign   . Fibrocystic breast disease   . GERD (gastroesophageal reflux disease)   . Gestational hypertension   . Heart murmur   . History of anemia   . History of blood transfusion   . Hodgkin's lymphoma ([place]) 2011   a. s/p radiation and chemo therapy  . Osteoarthritis   . Polycystic ovarian disease      Assessment: Patient is a 69 y/o F with medical history including CHF, history of endometrial cancer on immunotherapy who presented to the ED with shortness of breath. She was subsequently found to have acute PE. No anticoagulation prior to admission per chart review. Pharmacy has been consulted to initiate and manage heparin infusion for treatment of PE.   Baseline aPTT elevated to 61s. Unclear if true baseline as lab was drawn in close proximity to initiation of infusion and could have been drawn after start.   Goal of Therapy:  Heparin level 0.3-0.7 units/ml Monitor platelets by anticoagulation protocol: Yes   Plan:  --0901 at 1439 HL = 1.84, supratherapeutic. Hold infusion x 1 hour and then re-start drip at decreased rate of 800 units/hr --HL 8 hours after re-initiation of infusion --Daily CBC per protocol. H&H, platelets down-trending.   Benita Gutter 02/14/2020,5:26 PM

## 2020-02-14 NOTE — ED Notes (Signed)
Attempted to call report to the floor x3.

## 2020-02-14 NOTE — H&P (Signed)
History and Physical    Jaime Hall:[id number] DOB: Nov 06, 1950 DOA: 02/14/2020  PCP: [name], MD   Patient coming from: Home  I have personally briefly reviewed patient's old medical records in [place]  Chief Complaint: Shortness of breath  HPI: Jaime Hall is a 69 y.o. female with medical history significant for chronic combined systolic and diastolic dysfunction CHF, history of endometrial cancer on immunotherapy, hypertension who presents to the ER for evaluation of worsening exertional shortness of breath over the last 3 weeks associated with fatigue and generalized weakness.  Shortness of breath is associated with occasional chest discomfort over the right anterior chest wall.  She has an occasional cough productive of yellow phlegm but denies having any fever or chills.  She has no sick contacts. She denies having any diaphoresis, no palpitations, no orthopnea, no abdominal pain, no changes in her bowel habits or any urinary symptoms. Patient was seen at [facility] for same as well as a hospital in [place] where she had a recent 2D echocardiogram that showed an LVEF of 25 to 30%. Chest x-ray reviewed by me shows no acute pulmonary disease. CT angiogram of the chest shows acute segmental emboli to the right lower lobe. Mild ground-glass opacity in the right lower lobe, unlikely to be ischemic given the nonocclusive clot and patchy pattern. Please correlate for infectious symptoms. Twelve-lead EKG shows normal sinus rhythm with LVH  ED Course: Patient is a 69 year old Caucasian female with a history of endometrial cancer on immunotherapy who presents to the ER for evaluation of worsening shortness of breath.  CT angiogram shows a right lower lobe acute segmental emboli.  Patient has been started on a heparin drip and will be admitted to the hospital for further evaluation.  Review of Systems: As per HPI otherwise 10 point review of  systems negative.    Past Medical History:  Diagnosis Date  . Anxiety   . Cervicalgia   . CHF (congestive heart failure) ([place])   . Chronic kidney disease   . Coronary artery disease    a. 02/2012 Stress echo: severe anterior wall ischemia;  b. 02/2012 Cath/PCI: LAD 95p (3.0 x 15 Xience EX DES), D1 90ost (PTCA - bifurcational dzs), EF 45% with anterior HK;  b. 02/2013 Ex MV: fixed anterior defect w/ minor reversibility, nl EF-->Med Rx.  . Endometrial cancer ([place])    a. 07/2016 s/p robotic hysterectomy, BSO w/ washings, sentinel node inj, mapping, bx, adhesiolysis.  . Essential hypertension, benign   . Fibrocystic breast disease   . GERD (gastroesophageal reflux disease)   . Gestational hypertension   . Heart murmur   . History of anemia   . History of blood transfusion   . Hodgkin's lymphoma ([place]) 2011   a. s/p radiation and chemo therapy  . Osteoarthritis   . Polycystic ovarian disease     Past Surgical History:  Procedure Laterality Date  . ABDOMINAL HYSTERECTOMY    . bladder sling    . CARDIAC CATHETERIZATION  02/2012   [facility] 1 stent place  . CERVICAL POLYPECTOMY    . CHOLECYSTECTOMY  1982  . COLONOSCOPY WITH PROPOFOL N/A 02/05/2015   Procedure: COLONOSCOPY WITH PROPOFOL;  Surgeon: Lucilla Lame, MD;  Location: [facility] ENDOSCOPY;  Service: Endoscopy;  Laterality: N/A;  . CORONARY ANGIOPLASTY  02/2012   left/right s/p balloon  . CYSTOGRAM N/A 08/17/2016   Procedure: CYSTOGRAM;  Surgeon: Hollice Espy, MD;  Location: [facility] ORS;  Service: Urology;  Laterality: N/A;  . CYSTOSCOPY N/A 08/17/2016   Procedure: CYSTOSCOPY EXAM UNDER ANESTHESIA;  Surgeon: Hollice Espy, MD;  Location: [facility] ORS;  Service: Urology;  Laterality: N/A;  . CYSTOSCOPY W/ RETROGRADES Bilateral 08/17/2016   Procedure: CYSTOSCOPY WITH RETROGRADE PYELOGRAM;  Surgeon: Hollice Espy, MD;  Location: [facility] ORS;  Service: Urology;  Laterality: Bilateral;  . CYSTOSCOPY WITH STENT PLACEMENT Right 08/17/2016   Procedure: CYSTOSCOPY  WITH STENT PLACEMENT;  Surgeon: Hollice Espy, MD;  Location: [facility] ORS;  Service: Urology;  Laterality: Right;  . heart stent'  2013  . kidney stent Right 2018  . LYMPH NODE BIOPSY  2011   diagnosis of hodgkins lymphoma  . PELVIC LYMPH NODE DISSECTION N/A 07/29/2016   Procedure: PELVIC/AORTIC LYMPH NODE SAMPLING;  Surgeon: [name], MD;  Location: [facility] ORS;  Service: Gynecology;  Laterality: N/A;  . PORTA CATH INSERTION N/A 09/22/2016   Procedure: Glori Luis Cath Insertion;  Surgeon: [name], MD;  Location: [place] CV LAB;  Service: Cardiovascular;  Laterality: N/A;  . PORTA CATH INSERTION N/A 10/27/2019   Procedure: PORTA CATH INSERTION;  Surgeon: [name], MD;  Location: [place] [facility] LAB;  Service: Cardiovascular;  Laterality: N/A;  . PORTA CATH REMOVAL N/A 11/17/2016   Procedure: Glori Luis Cath Removal;  Surgeon: [name], MD;  Location: [place] CV LAB;  Service: Cardiovascular;  Laterality: N/A;  . RIGHT/LEFT HEART CATH AND CORONARY ANGIOGRAPHY N/A 12/04/2019   Procedure: RIGHT/LEFT HEART CATH AND CORONARY ANGIOGRAPHY;  Surgeon: Wellington Hampshire, MD;  Location: [place] [facility] LAB;  Service: Cardiovascular;  Laterality: N/A;  . ROBOTIC ASSISTED TOTAL HYSTERECTOMY WITH BILATERAL SALPINGO OOPHERECTOMY N/A 07/29/2016   Procedure: ROBOTIC ASSISTED TOTAL HYSTERECTOMY WITH BILATERAL SALPINGO OOPHORECTOMY;  Surgeon: [name], MD;  Location: [facility] ORS;  Service: Gynecology;  Laterality: N/A;  . SENTINEL NODE BIOPSY N/A 07/29/2016   Procedure: SENTINEL NODE BIOPSY;  Surgeon: [name], MD;  Location: [facility] ORS;  Service: Gynecology;  Laterality: N/A;  . transobturator sling N/A 2009   [place]     reports that she quit smoking about 17 years ago. Her smoking use included cigarettes. She has a 30.00 pack-year smoking history. She has never used smokeless tobacco. She reports previous alcohol use. She reports that she does not  use drugs.  Allergies  Allergen Reactions  . Adhesive [Tape] Other (See Comments)    Skin Irritation   . Antifungal [Miconazole Nitrate] Rash  . Sulfa Antibiotics Nausea And Vomiting and Rash  . Z-Pak [Azithromycin] Itching    Family History  Problem Relation Age of Onset  . ALS Father   . Polymyositis Father   . Diabetes Brother   . Cancer Maternal Aunt        breast  . Breast cancer Maternal Aunt        30's  . Stroke Maternal Grandmother   . Cancer Maternal Grandfather        prostate  . Stroke Maternal Grandfather   . Colon cancer Maternal Aunt   . Non-Hodgkin's lymphoma Cousin      Prior to Admission medications   Medication Sig Start Date End Date Taking? Authorizing Provider  aspirin EC 81 MG tablet Take 81 mg by mouth at bedtime.    [provider]  atorvastatin (LIPITOR) 10 MG tablet Take 1 tablet by mouth once daily Patient taking differently: Take 10 mg by mouth every evening.  10/23/19   Wellington Hampshire, MD  carvedilol (COREG) 3.125 MG  tablet Take 1 tablet (3.125 mg total) by mouth 2 (two) times daily with a meal. 02/05/20   Loel Dubonnet, NP  cholecalciferol (VITAMIN D3) 25 MCG (1000 UNIT) tablet Take 1,000 Units by mouth in the morning and at bedtime.    [provider]  cyanocobalamin (,VITAMIN B-12,) 1000 MCG/ML injection INJECT 1ML IM WEEKLY FOR 4 WEEKS, THEN INJECT MONTHLY THEREAFTER Patient taking differently: Inject 1,000 mcg into the skin every 30 (thirty) days.  09/05/19   [name], MD  DULoxetine (CYMBALTA) 30 MG capsule Take 1 capsule (30 mg total) by mouth daily. 02/01/20   Sindy Guadeloupe, MD  furosemide (LASIX) 20 MG tablet Take 1 tablet (20 mg total) by mouth as needed for fluid or edema (Take one tablet daily as needed for lower extremity edema or for weight gain of 2 lbs overnight or 5 lbs in one week.). Patient taking differently: Take 20 mg by mouth daily.  12/20/19   Loel Dubonnet, NP  lenvatinib 10 mg daily dose  (LENVIMA, 10 MG DAILY DOSE,) capsule Take 1 capsule (10 mg total) by mouth daily. 02/01/20   Sindy Guadeloupe, MD  levothyroxine (SYNTHROID) 50 MCG tablet Take 1 tablet (50 mcg total) by mouth daily before breakfast. 01/17/20   Cammie Sickle, MD  LORazepam (ATIVAN) 0.5 MG tablet Take 1 tablet (0.5 mg total) by mouth every 6 (six) hours as needed for anxiety (and to help with breathing). 02/01/20   Sindy Guadeloupe, MD  losartan (COZAAR) 25 MG tablet Take 0.5 tablets (12.5 mg total) by mouth daily. 02/05/20 05/05/20  Loel Dubonnet, NP  nystatin (MYCOSTATIN/NYSTOP) powder Apply 1 application topically 4 (four) times daily. To affect areas for yeast infection of the skin. 02/07/20   Verlon Au, NP  pantoprazole (PROTONIX) 20 MG tablet Take 1 tablet (20 mg total) by mouth daily. 12/20/19   Jacquelin Hawking, NP  prochlorperazine (COMPAZINE) 10 MG tablet Take 1 tablet (10 mg total) by mouth every 6 (six) hours as needed (Nausea or vomiting). Patient taking differently: Take 10 mg by mouth daily as needed for nausea or vomiting.  10/25/19 01/04/20  Sindy Guadeloupe, MD    Physical Exam: Vitals:   02/13/20 1652 02/14/20 0340 02/14/20 0700 02/14/20 0730  BP:  (!) 108/59 118/61 (!) 110/55  Pulse:  83 78 79  Resp:  16 (!) 32 (!) 34  Temp:  98.9 F (37.2 C)    TempSrc:  Oral    SpO2:  97% 96% 96%  Weight: 69.9 kg     Height: 5\' 1"  (1.549 m)        Vitals:   02/13/20 1652 02/14/20 0340 02/14/20 0700 02/14/20 0730  BP:  (!) 108/59 118/61 (!) 110/55  Pulse:  83 78 79  Resp:  16 (!) 32 (!) 34  Temp:  98.9 F (37.2 C)    TempSrc:  Oral    SpO2:  97% 96% 96%  Weight: 69.9 kg     Height: 5\' 1"  (1.549 m)       Constitutional: NAD, alert and oriented x 3.  Chronically ill-appearing Eyes: PERRL, lids and conjunctivae pallor ENMT: Mucous membranes are moist.  Neck: normal, supple, no masses, no thyromegaly Respiratory: clear to auscultation bilaterally, no wheezing, no crackles. Normal  respiratory effort. No accessory muscle use.  Cardiovascular: Regular rate and rhythm, no murmurs / rubs / gallops. No extremity edema. 2+ pedal pulses. No carotid bruits.  Abdomen: no tenderness, no  masses palpated. No hepatosplenomegaly. Bowel sounds positive.  Central adiposity Musculoskeletal: no clubbing / cyanosis. No joint deformity upper and lower extremities.  Skin: no rashes, lesions, ulcers.  Neurologic: No gross focal neurologic deficit. Psychiatric: Normal mood and affect.   Labs on Admission: I have personally reviewed following labs and imaging studies  CBC: Recent Labs  Lab 02/13/20 1659  WBC 6.3  HGB 12.5  HCT 38.2  MCV 95.5  PLT 767*   Basic Metabolic Panel: Recent Labs  Lab 02/13/20 1659  NA 141  K 3.5  CL 102  CO2 25  GLUCOSE 114*  BUN 19  CREATININE 1.27*  CALCIUM 8.9  MG 1.3*   GFR: Estimated Creatinine Clearance: 36.8 mL/min (A) (by C-G formula based on SCr of 1.27 mg/dL (H)). Liver Function Tests: Recent Labs  Lab 02/13/20 1659  AST 27  ALT 14  ALKPHOS 67  BILITOT 2.8*  PROT 6.8  ALBUMIN 3.8   No results for input(s): LIPASE, AMYLASE in the last 168 hours. No results for input(s): AMMONIA in the last 168 hours. Coagulation Profile: Recent Labs  Lab 02/14/20 0729  INR 1.1   Cardiac Enzymes: No results for input(s): CKTOTAL, CKMB, CKMBINDEX, TROPONINI in the last 168 hours. BNP (last 3 results) No results for input(s): PROBNP in the last 8760 hours. HbA1C: No results for input(s): HGBA1C in the last 72 hours. CBG: No results for input(s): GLUCAP in the last 168 hours. Lipid Profile: No results for input(s): CHOL, HDL, LDLCALC, TRIG, CHOLHDL, LDLDIRECT in the last 72 hours. Thyroid Function Tests: No results for input(s): TSH, T4TOTAL, FREET4, T3FREE, THYROIDAB in the last 72 hours. Anemia Panel: No results for input(s): VITAMINB12, FOLATE, FERRITIN, TIBC, IRON, RETICCTPCT in the last 72 hours. Urine analysis:    Component  Value Date/Time   COLORURINE YELLOW (A) 08/05/2016 1950   APPEARANCEUR Clear 09/24/2016 0945   LABSPEC 1.018 08/05/2016 1950   PHURINE 6.0 08/05/2016 1950   GLUCOSEU Negative 09/24/2016 0945   GLUCOSEU NEGATIVE 07/07/2016 1418   HGBUR MODERATE (A) 08/05/2016 1950   BILIRUBINUR negative 10/02/2019 1627   BILIRUBINUR Negative 09/24/2016 0945   KETONESUR 5 (A) 08/05/2016 1950   PROTEINUR Negative 10/02/2019 1627   PROTEINUR Negative 09/24/2016 0945   PROTEINUR 100 (A) 08/05/2016 1950   UROBILINOGEN 0.2 10/02/2019 1627   UROBILINOGEN 0.2 07/07/2016 1418   NITRITE negatvie 10/02/2019 1627   NITRITE Negative 09/24/2016 0945   NITRITE NEGATIVE 08/05/2016 1950   LEUKOCYTESUR Negative 10/02/2019 1627   LEUKOCYTESUR Negative 09/24/2016 0945    Radiological Exams on Admission: DG Chest 2 View  Result Date: 02/13/2020 CLINICAL DATA:  Shortness of breath. On chemotherapy for endometrial cancer. EXAM: CHEST - 2 VIEW COMPARISON:  12/29/2019 FINDINGS: Midline trachea. Normal heart size. Right sided central line terminates at the caval atrial junction. Paramediastinal radiation fibrosis with architectural distortion, similar. No pleural effusion or pneumothorax. Mild right hemidiaphragm elevation. Basilar predominant interstitial thickening, likely related to COPD/chronic bronchitis. No lobar consolidation. Right upper quadrant surgical clips. IMPRESSION: No acute cardiopulmonary disease. Electronically Signed   By: Abigail Miyamoto M.D.   On: 02/13/2020 17:29   CT Angio Chest PE W and/or Wo Contrast  Result Date: 02/14/2020 CLINICAL DATA:  High probability for pulmonary embolism. Shortness of breath. On chemotherapy for endometrial cancer. EXAM: CT ANGIOGRAPHY CHEST WITH CONTRAST TECHNIQUE: Multidetector CT imaging of the chest was performed using the standard protocol during bolus administration of intravenous contrast. Multiplanar CT image reconstructions and MIPs were obtained to evaluate the  vascular  anatomy. CONTRAST:  45mL OMNIPAQUE IOHEXOL 350 MG/ML SOLN COMPARISON:  01/16/2020 chest CT FINDINGS: Cardiovascular: Cardiomegaly, most notably left heart dilatation. Atherosclerotic calcification and LAD stenting. Branching, central pulmonary artery filling defects in lateral and posterior segments of the right lower lobe. Porta catheter on the right. Mediastinum/Nodes: No adenopathy Lungs/Pleura: Bilateral perihilar radiation fibrosis. Mild ground-glass density asymmetric to the right base. Upper Abdomen: No acute finding.  Cholecystectomy. Musculoskeletal: No acute or aggressive finding. Review of the MIP images confirms the above findings. Critical Value/emergent results were called by telephone at the time of interpretation on 02/14/2020 at 6:28 am to provider [facility] , who verbally acknowledged these results. IMPRESSION: 1. Acute segmental emboli to the right lower lobe. 2. Mild ground-glass opacity in the right lower lobe, unlikely to be ischemic given the nonocclusive clot and patchy pattern. Please correlate for infectious symptoms. Electronically Signed   By: Monte Fantasia M.D.   On: 02/14/2020 06:30    EKG: Independently reviewed.  Normal sinus rhythm LVH  Assessment/Plan Principal Problem:   Acute pulmonary embolism (HCC) Active Problems:   Endometrial cancer (HCC)   Chronic systolic CHF (congestive heart failure) (HCC)   Thrombocytopenia (HCC)   Hypothyroidism      Acute pulmonary embolism Most likely secondary to known endometrial cancer Patient is currently on a heparin drip and will likely be discharged on subcu Lovenox We will request hematology consult Obtain lower extremity venous Dopplers    Endometrial cancer Recurrent Status post hysterectomy Currently on immunotherapy due to inability to tolerate chemotherapy Follow-up with oncology as an outpatient   Chronic systolic heart failure Last known LVEF of 25 to 30% Continue carvedilol Hold furosemide for  now since patient is normotensive   Hypothyroidism Continue Synthroid   Thrombocytopenia Monitor platelet count closely   DVT prophylaxis: Heparin drip Code Status: Full code Family Communication: Greater than 50% of time was spent discussing patient's condition and plan of care with her at the bedside.  All questions and concerns have been addressed.  She verbalizes understanding and agrees with the plan.  She does not have a designated healthcare power of attorney but requests that her sons make decisions regarding her medical treatment if she is unable to Disposition Plan: Back to previous home environment Consults called: Oncology    Collier Bullock MD [facility]     02/14/2020, 8:26 AM

## 2020-02-14 NOTE — ED Notes (Signed)
Pt ambulatory to toilet, NAD noted

## 2020-02-14 NOTE — Assessment & Plan Note (Addendum)
#   69 year old female patient with multiple medical problems including congestive heart failure/metastatic endometrial cancer currently admitted to hospital for worsening shortness of breath/generalized weakness; CT scan suggestive of acute right segmental pulmonary embolism  #Acute worsening shortness of breath-appears multifactorial-acute right segmental pulmonary embolism/also underlying congestive heart failure-currently on IV heparin.  #Endometrial cancer metastatic/peritoneal carcinomatosis currently on lenvatinib plus Keytruda status post cycle #2-early to assess response.  #Extreme fatigue-likely secondary to underlying malignancy/acute illness.  Do not suspect any endocrinopathy from [facility].  Recommendation:  #Agree with IV heparin; given the relatively low volume PE-I think is reasonable to start patient on Eliquis starting September 2 [Eliquis p.o. 10 mg twice daily for 1 week; then 5 mg twice daily].  Patient will likely need indefinite anticoagulation/given her underlying metastatic malignancy.   #Hold lenvatinib in hospital/with acute issues.  #  Given the extreme fatigue I would recommend checking-thyroid work-up; a.m. cortisol.   Thank you Dr.Agbata for allowing me to participate in the care of your pleasant patient. Please do not hesitate to contact me with questions or concerns in the interim. Dr.Rao to resume care starting 02/15/2020.  As per the patient's request-I have left a message for the patient's granddaughter Gerrit Friends an update of patient's clinical status.

## 2020-02-15 ENCOUNTER — Ambulatory Visit: Payer: PPO

## 2020-02-15 ENCOUNTER — Other Ambulatory Visit: Payer: PPO

## 2020-02-15 DIAGNOSIS — D649 Anemia, unspecified: Secondary | ICD-10-CM

## 2020-02-15 DIAGNOSIS — E039 Hypothyroidism, unspecified: Secondary | ICD-10-CM

## 2020-02-15 DIAGNOSIS — Z7189 Other specified counseling: Secondary | ICD-10-CM

## 2020-02-15 DIAGNOSIS — C541 Malignant neoplasm of endometrium: Secondary | ICD-10-CM

## 2020-02-15 DIAGNOSIS — I5043 Acute on chronic combined systolic (congestive) and diastolic (congestive) heart failure: Secondary | ICD-10-CM

## 2020-02-15 DIAGNOSIS — D696 Thrombocytopenia, unspecified: Secondary | ICD-10-CM

## 2020-02-15 DIAGNOSIS — I5022 Chronic systolic (congestive) heart failure: Secondary | ICD-10-CM

## 2020-02-15 LAB — CBC
HCT: 30.9 % — ABNORMAL LOW (ref 36.0–46.0)
Hemoglobin: 10.3 g/dL — ABNORMAL LOW (ref 12.0–15.0)
MCH: 31.5 pg (ref 26.0–34.0)
MCHC: 33.3 g/dL (ref 30.0–36.0)
MCV: 94.5 fL (ref 80.0–100.0)
Platelets: 97 10*3/uL — ABNORMAL LOW (ref 150–400)
RBC: 3.27 MIL/uL — ABNORMAL LOW (ref 3.87–5.11)
RDW: 17.5 % — ABNORMAL HIGH (ref 11.5–15.5)
WBC: 4.9 10*3/uL (ref 4.0–10.5)
nRBC: 0 % (ref 0.0–0.2)

## 2020-02-15 LAB — BASIC METABOLIC PANEL
Anion gap: 9 (ref 5–15)
BUN: 17 mg/dL (ref 8–23)
CO2: 26 mmol/L (ref 22–32)
Calcium: 7.9 mg/dL — ABNORMAL LOW (ref 8.9–10.3)
Chloride: 105 mmol/L (ref 98–111)
Creatinine, Ser: 1.18 mg/dL — ABNORMAL HIGH (ref 0.44–1.00)
GFR calc Af Amer: 54 mL/min — ABNORMAL LOW (ref >60)
GFR calc non Af Amer: 47 mL/min — ABNORMAL LOW (ref >60)
Glucose, Bld: 100 mg/dL — ABNORMAL HIGH (ref 70–99)
Potassium: 3.7 mmol/L (ref 3.5–5.1)
Sodium: 140 mmol/L (ref 135–145)

## 2020-02-15 LAB — TSH: TSH: 21.512 u[IU]/mL — ABNORMAL HIGH (ref 0.350–4.500)

## 2020-02-15 LAB — HEPARIN LEVEL (UNFRACTIONATED): Heparin Unfractionated: 1.28 IU/mL — ABNORMAL HIGH (ref 0.30–0.70)

## 2020-02-15 MED ORDER — APIXABAN 5 MG PO TABS
10.0000 mg | ORAL_TABLET | Freq: Two times a day (BID) | ORAL | 0 refills | Status: DC
Start: 2020-02-16 — End: 2020-02-22

## 2020-02-15 MED ORDER — APIXABAN 5 MG PO TABS
10.0000 mg | ORAL_TABLET | Freq: Two times a day (BID) | ORAL | Status: DC
  Administered 2020-02-15 – 2020-02-16 (×3): 10 mg via ORAL
  Filled 2020-02-15 (×4): qty 2

## 2020-02-15 MED ORDER — APIXABAN 5 MG PO TABS: 5.0000 mg | ORAL_TABLET | Freq: Two times a day (BID) | ORAL | Status: DC

## 2020-02-15 MED ORDER — GUAIFENESIN-DM 100-10 MG/5ML PO SYRP
5.0000 mL | ORAL_SOLUTION | Freq: Four times a day (QID) | ORAL | Status: DC | PRN
  Administered 2020-02-15 – 2020-02-16 (×2): 5 mL via ORAL
  Filled 2020-02-15 (×2): qty 5

## 2020-02-15 MED ORDER — APIXABAN 5 MG PO TABS
5.0000 mg | ORAL_TABLET | Freq: Two times a day (BID) | ORAL | 0 refills | Status: DC
Start: 2020-02-22 — End: 2020-02-20

## 2020-02-15 MED ORDER — HEPARIN (PORCINE) 25000 UT/250ML-% IV SOLN
550.0000 [IU]/h | INTRAVENOUS | Status: DC
  Administered 2020-02-15: 550 [IU]/h via INTRAVENOUS
  Filled 2020-02-15: qty 250

## 2020-02-15 MED ORDER — ENSURE ENLIVE PO LIQD
237.0000 mL | Freq: Two times a day (BID) | ORAL | Status: DC
  Administered 2020-02-15 – 2020-02-16 (×2): 237 mL via ORAL

## 2020-02-15 MED ORDER — CHLORHEXIDINE GLUCONATE CLOTH 2 % EX PADS
6.0000 | MEDICATED_PAD | Freq: Every day | CUTANEOUS | Status: DC
  Administered 2020-02-15: 6 via TOPICAL

## 2020-02-15 NOTE — Progress Notes (Signed)
Ch visited pt. per OR for prayer; when [facility] arrived, pt. sitting up in bed watching TV; she shared that she came to the hospital after experiencing shortness of breath and fatigue, and that she is also battling endometrial cancer that has returned after remission.  Pt.'s son, dtr-in-law, and grandchildren live in town, and she has an additional son and family in [place], and she expressed feeling well supported by her family.  Pt. says it has been difficult to have her cancer return; the immunotherapy she is currently undergoing seems to have caused a blood clot in her lung but she does not want to go back to doing chemotherapy.  [place] had to cut visit short in order to go to meeting, but promised to keep pt. in his prayers.  Pt. is aware of chaplains' availability if needed today; she expects to be discharged today if all goes as planned.

## 2020-02-15 NOTE — Plan of Care (Signed)
  Problem: Phase I Progression Outcomes Goal: Dyspnea controlled at rest (PE) Outcome: Progressing   Problem: Phase I Progression Outcomes Goal: Hemodynamically stable Outcome: Progressing

## 2020-02-15 NOTE — Plan of Care (Signed)
  Problem: Phase I Progression Outcomes Goal: Pain controlled with appropriate interventions Outcome: Progressing  No complaints of pain

## 2020-02-15 NOTE — Progress Notes (Signed)
[facility] for Heparin Indication: pulmonary embolus  Allergies  Allergen Reactions  . Adhesive [Tape] Other (See Comments)    Skin Irritation   . Antifungal [Miconazole Nitrate] Rash  . Sulfa Antibiotics Nausea And Vomiting and Rash  . Z-Pak [Azithromycin] Itching    Patient Measurements: Height: 5\' 1"  (154.9 cm) Weight: 69.9 kg (154 lb) IBW/kg (Calculated) : 47.8 HEPARIN DW (KG): 62.8  Vital Signs: Temp: 98.1 F (36.7 C) (09/02 0006) BP: 126/70 (09/02 0006) Pulse Rate: 76 (09/02 0006)  Labs: Recent Labs    02/13/20 1659 02/13/20 1659 02/14/20 0452 02/14/20 0729 02/14/20 1200 02/14/20 1439 02/15/20 0306  HGB 12.5   < >  --   --   --  11.3* 10.3*  HCT 38.2  --   --   --   --  33.9* 30.9*  PLT 127*  --   --   --   --  106* 97*  APTT  --   --   --  61*  --   --   --   LABPROT  --   --   --  13.4  --   --   --   INR  --   --   --  1.1  --   --   --   HEPARINUNFRC  --   --   --   --   --  1.84* 1.28*  CREATININE 1.27*  --   --   --   --   --  1.18*  TROPONINIHS  --   --  25*  --  26*  --   --    < > = values in this interval not displayed.   Estimated Creatinine Clearance: 39.6 mL/min (A) (by C-G formula based on SCr of 1.18 mg/dL (H)).  Medical History: Past Medical History:  Diagnosis Date  . Anxiety   . Cervicalgia   . CHF (congestive heart failure) ([place])   . Chronic kidney disease   . Coronary artery disease    a. 02/2012 Stress echo: severe anterior wall ischemia;  b. 02/2012 Cath/PCI: LAD 95p (3.0 x 15 Xience EX DES), D1 90ost (PTCA - bifurcational dzs), EF 45% with anterior HK;  b. 02/2013 Ex MV: fixed anterior defect w/ minor reversibility, nl EF-->Med Rx.  . Endometrial cancer ([place])    a. 07/2016 s/p robotic hysterectomy, BSO w/ washings, sentinel node inj, mapping, bx, adhesiolysis.  . Essential hypertension, benign   . Fibrocystic breast disease   . GERD (gastroesophageal reflux disease)   . Gestational  hypertension   . Heart murmur   . History of anemia   . History of blood transfusion   . Hodgkin's lymphoma ([place]) 2011   a. s/p radiation and chemo therapy  . Osteoarthritis   . Polycystic ovarian disease    Assessment: Patient is a 69 y/o F with medical history including CHF, history of endometrial cancer on immunotherapy who presented to the ED with shortness of breath. She was subsequently found to have acute PE. No anticoagulation prior to admission per chart review. Pharmacy has been consulted to initiate and manage heparin infusion for treatment of PE.   Baseline aPTT elevated to 61s. Unclear if true baseline as lab was drawn in close proximity to initiation of infusion and could have been drawn after start.   9/2 @ 0306 HL 1.28, remains SUPRAtherapeutic, CBC worse  Goal of Therapy:  Heparin level 0.3-0.7 units/ml Monitor platelets by anticoagulation protocol: Yes  Plan:   HL remains SUPRAtherapeutic. Will hold infusion x 1 hour and then re-start drip at decreased rate of 550 units/hr  Recheck HL and aPTT 8 hours after re-initiation of infusion  Daily CBC per protocol. H&H, platelets down-trending.   [place] Crane, Boyd Litaker A 02/15/2020,4:20 AM

## 2020-02-15 NOTE — Progress Notes (Signed)
Initial Nutrition Assessment  DOCUMENTATION CODES:   Not applicable  INTERVENTION:  Ensure Enlive po BID, each supplement provides 350 kcal and 20 grams of protein (chocolate)  Encouraged po intake of meals and supplements  NUTRITION DIAGNOSIS:   Inadequate oral intake related to cancer and cancer related treatments, decreased appetite (endometrial cancer on immunotherapy s/p 2 cycles) as evidenced by per patient/family report.  GOAL:   Patient will meet greater than or equal to 90% of their needs    MONITOR:   Labs, Supplement acceptance, PO intake, Weight trends, I & O's  REASON FOR ASSESSMENT:   Malnutrition Screening Tool    ASSESSMENT:  RD working remotely.  69 year old female with history significant for chronic combined CHF, endometrial cancer s/p hysterectomy (2018) on immunotherapy, Hodgkin's lymphoma s/p chemo and radiation therapy (2011) HTN, CAD s/p stent (EF 45%), GERD, anxiety presented with worsening exertional shortness of breath over the last 3 days associated with occasional right anterior wall chest discomfort, fatigue and generalized weakness admitted with acute pulmonary embolism.  Able to speak with pt via phone this morning, reports feeling pretty good today. States she really enjoyed her omelette for breakfast, says it was a large portion and unable to eat it all. Patient reports her appetite has been off and on since the start of immunotherapy in May, currently on lenvatinib plus Keytruda s/p 2 cycles. Recalls usually eating better in the morning and picks throughout the day, states "the more food that is out, the better she picks" RD encouraged continued small frequent meals and snacks throughout the day and recommended daily oral nutrition supplement. Patient amenable to trying chocolate Ensure during admission to aid with meeting needs.   Current wt 155.98 lb, mild pitting BLE edema noted. Per chart weights have trended down ~6 lbs (3.5%) in the last 2  months; insignificant. Patient endorses 8 lb wt loss in the last 2 weeks, contributes some of her weight loss secondary to fluid pill.   Medications reviewed and include: D3, Lenvima, Protonix IV Heparin IVF: NaCl @ 100 ml/hr Labs: Cr 1.18 (H), Hgb 10.3 (L), HCT 30.9 (L)   NUTRITION - FOCUSED PHYSICAL EXAM: Unable to complete at this time, RD working remotely.  Diet Order:   Diet Order            Diet 2 gram sodium Room service appropriate? Yes; Fluid consistency: Thin  Diet effective now                 EDUCATION NEEDS:   Education needs have been addressed  Skin:  Skin Assessment: Reviewed RN Assessment  Last BM:  pta  Height:   Ht Readings from Last 1 Encounters:  02/13/20 5\' 1"  (1.549 m)    Weight:   Wt Readings from Last 1 Encounters:  02/15/20 70.9 kg    Ideal Body Weight:  47.7 kg  BMI:  Body mass index is 29.51 kg/m.  Estimated Nutritional Needs:   Kcal:  1900-2100  Protein:  95-105  Fluid:  >/= 1.9 L/day   Lajuan Lines, RD, LDN Clinical Nutrition After Hours/Weekend Pager # in [place]

## 2020-02-15 NOTE — Progress Notes (Signed)
Hematology/Oncology Consult note [facility]  [phone] [phone]  Patient Care Team: [name], MD as PCP - General (Internal Medicine) Wellington Hampshire, MD as PCP - Cardiology (Cardiology) [name], MD as Consulting Physician (General Surgery) Mellody Drown, MD as Referring Physician (Obstetrics and Gynecology) [name], MD as Referring Physician (Obstetrics and Gynecology) Hollice Espy, MD as Consulting Physician (Urology) [name], RN as Registered Nurse Sindy Guadeloupe, MD as Consulting Physician (Oncology)   Name of the patient: Jaime Hall  [id number]  1951-04-30   Date of visit: 02/16/20  Interval history- reports breathing has improved. Feels fatigued. She is finding it difficult to continue working at this time but has to for financial reasons. Reports ongoing non productive cough.   ECOG PS- 2 Pain scale- 0   Review of systems- Review of Systems  Constitutional: Positive for malaise/fatigue. Negative for chills, fever and weight loss.  HENT: Negative for congestion, ear discharge and nosebleeds.   Eyes: Negative for blurred vision.  Respiratory: Positive for cough and shortness of breath. Negative for hemoptysis, sputum production and wheezing.   Cardiovascular: Negative for chest pain, palpitations, orthopnea and claudication.  Gastrointestinal: Negative for abdominal pain, blood in stool, constipation, diarrhea, heartburn, melena, nausea and vomiting.  Genitourinary: Negative for dysuria, flank pain, frequency, hematuria and urgency.  Musculoskeletal: Negative for back pain, joint pain and myalgias.  Skin: Negative for rash.  Neurological: Negative for dizziness, tingling, focal weakness, seizures, weakness and headaches.  Endo/Heme/Allergies: Does not bruise/bleed easily.  Psychiatric/Behavioral: Negative for depression and suicidal ideas. The patient does not have  insomnia.       Allergies  Allergen Reactions  . Adhesive [Tape] Other (See Comments)    Skin Irritation   . Antifungal [Miconazole Nitrate] Rash  . Sulfa Antibiotics Nausea And Vomiting and Rash  . Z-Pak [Azithromycin] Itching     Past Medical History:  Diagnosis Date  . Anxiety   . Cervicalgia   . CHF (congestive heart failure) ([place])   . Chronic kidney disease   . Coronary artery disease    a. 02/2012 Stress echo: severe anterior wall ischemia;  b. 02/2012 Cath/PCI: LAD 95p (3.0 x 15 Xience EX DES), D1 90ost (PTCA - bifurcational dzs), EF 45% with anterior HK;  b. 02/2013 Ex MV: fixed anterior defect w/ minor reversibility, nl EF-->Med Rx.  . Endometrial cancer ([place])    a. 07/2016 s/p robotic hysterectomy, BSO w/ washings, sentinel node inj, mapping, bx, adhesiolysis.  . Essential hypertension, benign   . Fibrocystic breast disease   . GERD (gastroesophageal reflux disease)   . Gestational hypertension   . Heart murmur   . History of anemia   . History of blood transfusion   . Hodgkin's lymphoma ([place]) 2011   a. s/p radiation and chemo therapy  . Osteoarthritis   . Polycystic ovarian disease      Past Surgical History:  Procedure Laterality Date  . ABDOMINAL HYSTERECTOMY    . bladder sling    . CARDIAC CATHETERIZATION  02/2012   [facility] 1 stent place  . CERVICAL POLYPECTOMY    . CHOLECYSTECTOMY  1982  . COLONOSCOPY WITH PROPOFOL N/A 02/05/2015   Procedure: COLONOSCOPY WITH PROPOFOL;  Surgeon: Lucilla Lame, MD;  Location: [facility] ENDOSCOPY;  Service: Endoscopy;  Laterality: N/A;  . CORONARY ANGIOPLASTY  02/2012   left/right s/p balloon  . CYSTOGRAM N/A 08/17/2016   Procedure: CYSTOGRAM;  Surgeon: Hollice Espy, MD;  Location:  [facility] ORS;  Service: Urology;  Laterality: N/A;  . CYSTOSCOPY N/A 08/17/2016   Procedure: CYSTOSCOPY EXAM UNDER ANESTHESIA;  Surgeon: Hollice Espy, MD;  Location: [facility] ORS;  Service: Urology;  Laterality: N/A;  . CYSTOSCOPY W/ RETROGRADES Bilateral  08/17/2016   Procedure: CYSTOSCOPY WITH RETROGRADE PYELOGRAM;  Surgeon: Hollice Espy, MD;  Location: [facility] ORS;  Service: Urology;  Laterality: Bilateral;  . CYSTOSCOPY WITH STENT PLACEMENT Right 08/17/2016   Procedure: CYSTOSCOPY WITH STENT PLACEMENT;  Surgeon: Hollice Espy, MD;  Location: [facility] ORS;  Service: Urology;  Laterality: Right;  . heart stent'  2013  . kidney stent Right 2018  . LYMPH NODE BIOPSY  2011   diagnosis of hodgkins lymphoma  . PELVIC LYMPH NODE DISSECTION N/A 07/29/2016   Procedure: PELVIC/AORTIC LYMPH NODE SAMPLING;  Surgeon: [name], MD;  Location: [facility] ORS;  Service: Gynecology;  Laterality: N/A;  . PORTA CATH INSERTION N/A 09/22/2016   Procedure: Glori Luis Cath Insertion;  Surgeon: [name], MD;  Location: [place] [facility];  Service: Cardiovascular;  Laterality: N/A;  . PORTA CATH INSERTION N/A 10/27/2019   Procedure: PORTA CATH INSERTION;  Surgeon: [name], MD;  Location: [place] [facility];  Service: Cardiovascular;  Laterality: N/A;  . PORTA CATH REMOVAL N/A 11/17/2016   Procedure: Glori Luis Cath Removal;  Surgeon: [name], MD;  Location: [place] [facility];  Service: Cardiovascular;  Laterality: N/A;  . RIGHT/LEFT HEART CATH AND CORONARY ANGIOGRAPHY N/A 12/04/2019   Procedure: RIGHT/LEFT HEART CATH AND CORONARY ANGIOGRAPHY;  Surgeon: Wellington Hampshire, MD;  Location: [place] [facility];  Service: Cardiovascular;  Laterality: N/A;  . ROBOTIC ASSISTED TOTAL HYSTERECTOMY WITH BILATERAL SALPINGO OOPHERECTOMY N/A 07/29/2016   Procedure: ROBOTIC ASSISTED TOTAL HYSTERECTOMY WITH BILATERAL SALPINGO OOPHORECTOMY;  Surgeon: [name], MD;  Location: [facility] ORS;  Service: Gynecology;  Laterality: N/A;  . SENTINEL NODE BIOPSY N/A 07/29/2016   Procedure: SENTINEL NODE BIOPSY;  Surgeon: [name], MD;  Location: [facility] ORS;  Service: Gynecology;  Laterality: N/A;  . transobturator sling N/A 2009   [place]     Social History   Socioeconomic History  . Marital status: Widowed    Spouse name: Not on file  . Number of children: Not on file  . Years of education: Not on file  . Highest education level: Not on file  Occupational History  . Not on file  Tobacco Use  . Smoking status: Former Smoker    Packs/day: 1.00    Years: 30.00    Pack years: 30.00    Types: Cigarettes    Quit date: 07/24/2002    Years since quitting: 17.5  . Smokeless tobacco: Never Used  . Tobacco comment: quit smoking in 2000  Vaping Use  . Vaping Use: Never used  Substance and Sexual Activity  . Alcohol use: Not Currently  . Drug use: No  . Sexual activity: Never  Other Topics Concern  . 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    Social Determinants of Health   Financial Resource Strain:   . Difficulty of Paying Living Expenses: Not on file  Food Insecurity:   . Worried About Charity fundraiser in the Last Year: Not on file  . Ran Out of Food in the Last Year: Not on file  Transportation Needs:   . Lack of Transportation (Medical): Not on file  .  Lack of Transportation (Non-Medical): Not on file  Physical Activity:   . Days of Exercise per Week: Not on file  . Minutes of Exercise per Session: Not on file  Stress:   . Feeling of Stress : Not on file  Social Connections:   . Frequency of Communication with Friends and Family: Not on file  . Frequency of Social Gatherings with Friends and Family: Not on file  . Attends Religious Services: Not on file  . Active Member of Clubs or Organizations: Not on file  . Attends Archivist Meetings: Not on file  . Marital Status: Not on file  Intimate Partner Violence:   . Fear of Current or Ex-Partner: Not on file  . Emotionally Abused: Not on file  . Physically Abused: Not on file  . Sexually Abused: Not on file    Family History  Problem Relation Age of  Onset  . ALS Father   . Polymyositis Father   . Diabetes Brother   . Cancer Maternal Aunt        breast  . Breast cancer Maternal Aunt        30's  . Stroke Maternal Grandmother   . Cancer Maternal Grandfather        prostate  . Stroke Maternal Grandfather   . Colon cancer Maternal Aunt   . Non-Hodgkin's lymphoma Cousin      Current Facility-Administered Medications:  .  0.9 %  sodium chloride infusion, , Intravenous, Continuous, Agbata, Tochukwu, MD, Last Rate: 100 mL/hr at 02/15/20 1051, New Bag at 02/15/20 1051 .  acetaminophen (TYLENOL) tablet 650 mg, 650 mg, Oral, Q6H PRN **OR** acetaminophen (TYLENOL) suppository 650 mg, 650 mg, Rectal, Q6H PRN, Agbata, Tochukwu, MD .  apixaban (ELIQUIS) tablet 10 mg, 10 mg, Oral, BID, 10 mg at 02/15/20 1255 **FOLLOWED BY** [START ON 02/22/2020] apixaban (ELIQUIS) tablet 5 mg, 5 mg, Oral, BID, Manuella Ghazi, Vipul, MD .  aspirin EC tablet 81 mg, 81 mg, Oral, QHS, Agbata, Tochukwu, MD, 81 mg at 02/15/20 0049 .  atorvastatin (LIPITOR) tablet 10 mg, 10 mg, Oral, QPM, Agbata, Tochukwu, MD, 10 mg at 02/14/20 1841 .  carvedilol (COREG) tablet 3.125 mg, 3.125 mg, Oral, BID WC, Agbata, Tochukwu, MD, 3.125 mg at 02/15/20 0908 .  Chlorhexidine Gluconate Cloth 2 % PADS 6 each, 6 each, Topical, Daily, Agbata, Tochukwu, MD, 6 each at 02/15/20 1112 .  cholecalciferol (VITAMIN D3) tablet 1,000 Units, 1,000 Units, Oral, Daily, Agbata, Tochukwu, MD, 1,000 Units at 02/15/20 1046 .  DULoxetine (CYMBALTA) DR capsule 30 mg, 30 mg, Oral, Daily, Agbata, Tochukwu, MD, 30 mg at 02/15/20 1046 .  feeding supplement (ENSURE ENLIVE) (ENSURE ENLIVE) liquid 237 mL, 237 mL, Oral, BID BM, Manuella Ghazi, Vipul, MD .  lenvatinib 10 mg daily dose (LENVIMA) capsule 10 mg, 10 mg, Oral, Daily, Agbata, Tochukwu, MD .  levothyroxine (SYNTHROID) tablet 50 mcg, 50 mcg, Oral, QAC breakfast, Agbata, Tochukwu, MD, 50 mcg at 02/15/20 9678 .  LORazepam (ATIVAN) tablet 0.5 mg, 0.5 mg, Oral, Q6H PRN, Agbata,  Tochukwu, MD .  nystatin (MYCOSTATIN/NYSTOP) topical powder 1 application, 1 application, Topical, QID, Agbata, Tochukwu, MD, 1 application at 93/81/01 1048 .  ondansetron (ZOFRAN) tablet 4 mg, 4 mg, Oral, Q6H PRN **OR** ondansetron (ZOFRAN) injection 4 mg, 4 mg, Intravenous, Q6H PRN, Agbata, Tochukwu, MD .  pantoprazole (PROTONIX) EC tablet 20 mg, 20 mg, Oral, Daily, Agbata, Tochukwu, MD, 20 mg at 02/14/20 1202  Facility-Administered Medications Ordered in Other Encounters:  .  heparin lock  flush 100 unit/mL, 500 Units, Intravenous, Once, Randa Evens C, MD .  sodium chloride flush (NS) 0.9 % injection 10 mL, 10 mL, Intravenous, PRN, Sindy Guadeloupe, MD, 10 mL at 12/07/19 0914 .  sodium chloride flush (NS) 0.9 % injection 10 mL, 10 mL, Intravenous, PRN, Sindy Guadeloupe, MD, 10 mL at 01/04/20 0850  Physical exam:  Vitals:   02/15/20 0006 02/15/20 0455 02/15/20 0803 02/15/20 1137  BP: 126/70 (!) 109/55 (!) 115/53 109/65  Pulse: 76 72 82 84  Resp: 18 17 20 18   Temp: 98.1 F (36.7 C) 98.4 F (36.9 C) 98.3 F (36.8 C) 97.6 F (36.4 C)  TempSrc:   Oral Oral  SpO2: 96% 95% 97% 95%  Weight:  156 lb 3.2 oz (70.9 kg)    Height:       Physical Exam Constitutional:      Comments: Appears fatigued  Cardiovascular:     Rate and Rhythm: Regular rhythm.     Heart sounds: Normal heart sounds.  Pulmonary:     Effort: Pulmonary effort is normal.     Breath sounds: Normal breath sounds.  Abdominal:     General: Bowel sounds are normal.     Palpations: Abdomen is soft.  Musculoskeletal:     Right lower leg: No edema.     Left lower leg: No edema.  Skin:    General: Skin is warm and dry.  Neurological:     Mental Status: She is alert and oriented to person, place, and time.      CMP Latest Ref Rng & Units 02/15/2020  Glucose 70 - 99 mg/dL 100(H)  BUN 8 - 23 mg/dL 17  Creatinine 0.44 - 1.00 mg/dL 1.18(H)  Sodium 135 - 145 mmol/L 140  Potassium 3.5 - 5.1 mmol/L 3.7  Chloride 98 - 111  mmol/L 105  CO2 22 - 32 mmol/L 26  Calcium 8.9 - 10.3 mg/dL 7.9(L)  Total Protein 6.5 - 8.1 g/dL -  Total Bilirubin 0.3 - 1.2 mg/dL -  Alkaline Phos 38 - 126 U/L -  AST 15 - 41 U/L -  ALT 0 - 44 U/L -   CBC Latest Ref Rng & Units 02/15/2020  WBC 4.0 - 10.5 K/uL 4.9  Hemoglobin 12.0 - 15.0 g/dL 10.3(L)  Hematocrit 36 - 46 % 30.9(L)  Platelets 150 - 400 K/uL 97(L)    @IMAGES @  DG Chest 2 View  Result Date: 02/13/2020 CLINICAL DATA:  Shortness of breath. On chemotherapy for endometrial cancer. EXAM: CHEST - 2 VIEW COMPARISON:  12/29/2019 FINDINGS: Midline trachea. Normal heart size. Right sided central line terminates at the caval atrial junction. Paramediastinal radiation fibrosis with architectural distortion, similar. No pleural effusion or pneumothorax. Mild right hemidiaphragm elevation. Basilar predominant interstitial thickening, likely related to COPD/chronic bronchitis. No lobar consolidation. Right upper quadrant surgical clips. IMPRESSION: No acute cardiopulmonary disease. Electronically Signed   By: Abigail Miyamoto M.D.   On: 02/13/2020 17:29   CT Angio Chest PE W and/or Wo Contrast  Result Date: 02/14/2020 CLINICAL DATA:  High probability for pulmonary embolism. Shortness of breath. On chemotherapy for endometrial cancer. EXAM: CT ANGIOGRAPHY CHEST WITH CONTRAST TECHNIQUE: Multidetector CT imaging of the chest was performed using the standard protocol during bolus administration of intravenous contrast. Multiplanar CT image reconstructions and MIPs were obtained to evaluate the vascular anatomy. CONTRAST:  71mL OMNIPAQUE IOHEXOL 350 MG/ML SOLN COMPARISON:  01/16/2020 chest CT FINDINGS: Cardiovascular: Cardiomegaly, most notably left heart dilatation. Atherosclerotic calcification and LAD stenting.  Branching, central pulmonary artery filling defects in lateral and posterior segments of the right lower lobe. Porta catheter on the right. Mediastinum/Nodes: No adenopathy Lungs/Pleura:  Bilateral perihilar radiation fibrosis. Mild ground-glass density asymmetric to the right base. Upper Abdomen: No acute finding.  Cholecystectomy. Musculoskeletal: No acute or aggressive finding. Review of the MIP images confirms the above findings. Critical Value/emergent results were called by telephone at the time of interpretation on 02/14/2020 at 6:28 am to provider [facility] , who verbally acknowledged these results. IMPRESSION: 1. Acute segmental emboli to the right lower lobe. 2. Mild ground-glass opacity in the right lower lobe, unlikely to be ischemic given the nonocclusive clot and patchy pattern. Please correlate for infectious symptoms. Electronically Signed   By: Monte Fantasia M.D.   On: 02/14/2020 06:30     Assessment and plan- Patient is a 69 y.o. female with metastatic high grade serous endometrial carcinoma admitted for acute hypoxic respiratory failure and also found to have acute segmental pulmonary embolus  1.  Acute pulmonary embolism: Okay to transition to Eliquis from heparin.  She will need to stay on that indefinitely.  She does have 1 months sample as well as subsequent coupons and we will help her out with Eliquis prescriptions as an outpatient.  2.  Thrombocytopenia:Etiology unclear.  Platelet count was 134 on 02/01/2020 and gradually trending down since then.  Keytruda typically does not lead to significant thrombocytopenia.  Continue to monitor and will follow up as an outpatient.  3.  Metastatic endometrial carcinoma: Hold lenvatinib while she is inpatient.  I will see her next week upon discharge and decide about restarting treatment based on counts.  4.  Goals of care: Patient understands that overall her prognosis is poor.  She has congestive heart failure with an EF of 35% in addition to ongoing metastatic cancer.  She lives alone.  Presently she is independent of her ADLs and works about 4 to 6 hours a day.  She is finding it difficult to continue her job however  states that she has no other option if she has to pay her bills.  I will set her up to see palliative care as an outpatient as well.  We have discussed CODE STATUS with her as an outpatient and she has not made any decisions yet.  I will continue to have these conversations with her as an outpatient.  5.  Normocytic anemia: Likely secondary to ongoing malignancy as well as treatment.  Continue to monitor   Visit Diagnosis 1. Acute pulmonary embolism without acute cor pulmonale, unspecified pulmonary embolism type (HCC)      Dr. Randa Evens, MD, MPH [facility] at [facility] [phone] 02/15/2020 3:08 PM

## 2020-02-15 NOTE — Progress Notes (Signed)
1        [facility] NAME: [name]    MR#:  [id number]  DATE OF BIRTH:  September 25, 1950  SUBJECTIVE:  CHIEF COMPLAINT:   Chief Complaint  Patient presents with  . Shortness of Breath  Overall feeling better, some questions and concerns around anticoagulation and side effects which were answered.  She mentioned having her husband on anticoagulation in the past REVIEW OF SYSTEMS:  Review of Systems  Constitutional: Negative for diaphoresis, fever, malaise/fatigue and weight loss.  HENT: Negative for ear discharge, ear pain, hearing loss, nosebleeds, sore throat and tinnitus.   Eyes: Negative for blurred vision and pain.  Respiratory: Positive for shortness of breath. Negative for cough, hemoptysis and wheezing.   Cardiovascular: Negative for chest pain, palpitations, orthopnea and leg swelling.  Gastrointestinal: Negative for abdominal pain, blood in stool, constipation, diarrhea, heartburn, nausea and vomiting.  Genitourinary: Negative for dysuria, frequency and urgency.  Musculoskeletal: Negative for back pain and myalgias.  Skin: Negative for itching and rash.  Neurological: Negative for dizziness, tingling, tremors, focal weakness, seizures, weakness and headaches.  Psychiatric/Behavioral: Negative for depression. The patient is not nervous/anxious.    DRUG ALLERGIES:   Allergies  Allergen Reactions  . Adhesive [Tape] Other (See Comments)    Skin Irritation   . Antifungal [Miconazole Nitrate] Rash  . Sulfa Antibiotics Nausea And Vomiting and Rash  . Z-Pak [Azithromycin] Itching   VITALS:  Blood pressure 115/73, pulse 88, temperature 98 F (36.7 C), temperature source Oral, resp. rate 17, height 5\' 1"  (1.549 m), weight 70.9 kg, SpO2 99 %. PHYSICAL EXAMINATION:  Physical Exam HENT:     Head: Normocephalic and atraumatic.  Eyes:     Conjunctiva/sclera: Conjunctivae normal.     Pupils: Pupils are equal, round, and reactive to light.    Neck:     Thyroid: No thyromegaly.     Trachea: No tracheal deviation.  Cardiovascular:     Rate and Rhythm: Normal rate and regular rhythm.     Heart sounds: Normal heart sounds.  Pulmonary:     Effort: Pulmonary effort is normal. No respiratory distress.     Breath sounds: Normal breath sounds. No wheezing.  Chest:     Chest wall: No tenderness.  Abdominal:     General: Bowel sounds are normal. There is no distension.     Palpations: Abdomen is soft.     Tenderness: There is no abdominal tenderness.  Musculoskeletal:        General: Normal range of motion.     Cervical back: Normal range of motion and neck supple.  Skin:    General: Skin is warm and dry.     Findings: No rash.  Neurological:     Mental Status: She is alert and oriented to person, place, and time.     Cranial Nerves: No cranial nerve deficit.   Has a port on the upper chest wall LABORATORY PANEL:  Female CBC Recent Labs  Lab 02/15/20 0306  WBC 4.9  HGB 10.3*  HCT 30.9*  PLT 97*   ------------------------------------------------------------------------------------------------------------------ Chemistries  Recent Labs  Lab 02/13/20 1659 02/13/20 1659 02/15/20 0306  NA 141   < > 140  K 3.5   < > 3.7  CL 102   < > 105  CO2 25   < > 26  GLUCOSE 114*   < > 100*  BUN 19   < > 17  CREATININE 1.27*   < >  1.18*  CALCIUM 8.9   < > 7.9*  MG 1.3*  --   --   AST 27  --   --   ALT 14  --   --   ALKPHOS 67  --   --   BILITOT 2.8*  --   --    < > = values in this interval not displayed.   RADIOLOGY:  No results found. ASSESSMENT AND PLAN:  69 year old female with a known history of endometrial cancer on immunotherapy, hypertension, chronic systolic and diastolic heart failure is admitted for acute pulmonary embolism  Acute pulmonary embolism Treated with IV heparin on admission -transition to oral Eliquis for possible discharge tomorrow if she can tolerate.  Metastatic endometrial cancer/peritoneal  carcinomatosis On chemo per oncology  Chronic systolic heart failure EF of 25 to 30% Continue Coreg  Weakness/fatigue with history of hypothyroidism Checking TSH and morning cortisol Continue levothyroxine  Anxiety/depression Continue Ativan and Cymbalta  Body mass index is 29.51 kg/m.      Status is: Inpatient  Remains inpatient appropriate because:Inpatient level of care appropriate due to severity of illness   Dispo: The patient is from: Home              Anticipated d/c is to: Home              Anticipated d/c date is: 1 day              Patient currently is not medically stable to d/c.  Need to see if she tolerates oral Eliquis today for possible discharge tomorrow     DVT prophylaxis:             apixaban (ELIQUIS) tablet 10 mg  apixaban (ELIQUIS) tablet 5 mg     Family Communication: "discussed with patient"   All the records are reviewed and case discussed with Care Management/Social Worker. Management plans discussed with the patient, nursing and they are in agreement.  CODE STATUS: Full Code  TOTAL TIME TAKING CARE OF THIS PATIENT: 35 minutes.   More than 50% of the time was spent in counseling/coordination of care: YES  POSSIBLE D/C IN 1 DAYS, DEPENDING ON CLINICAL CONDITION.   Max Sane M.D on 02/15/2020 at 4:33 PM  [facility]   CC: Primary care physician; [name], MD  Note: This dictation was prepared with Dragon dictation along with smaller phrase technology. Any transcriptional errors that result from this process are unintentional.

## 2020-02-15 NOTE — TOC Initial Note (Signed)
Transition of Care [facility]) - Initial/Assessment Note    Patient Details  Name: Jaime Hall MRN: [id number] Date of Birth: 1951-01-06  Transition of Care [facility]) CM/SW Contact:    Shelbie Hutching, RN Phone Number: 02/15/2020, 2:28 PM  Clinical Narrative:                 Patient admitted to the hospital with acute pulmonary embolism, history of chronic CHF.  Patient will be started on Eliquis.  RNCM was able to speak with patient at the bedside and explain role in discharge planning.  Patient reports that she is from home and lives alone. Patient is independent in ADL's and requires no assistive devices at home.  Patient is current with PCP and cardiologist.  Patient reports that one of her medications is very expensive, the [facility], she pays $569 every 3 weeks and wont be able to afford that much longer.  RNCM will see about reaching out to the cancer center social work department to see if they have any resources for this medication.  RNCM will be able to provide patient with a free 30 day coupon for the Eliquis when she is discharged.   Patient agrees to home health services.  Home Health referral given to [facility] with [facility] for RN.  Patient does not appear to have any PT needs at this time. TOC team will cont to follow through discharge and assist with any additional needs.   Expected Discharge Plan: [place] Barriers to Discharge: Continued Medical Work up   Patient Goals and CMS Choice Patient states their goals for this hospitalization and ongoing recovery are:: Agrees to home health services for nursing and would like some help with her expensive Ketruda medication CMS Medicare.gov Compare Post Acute Care list provided to:: Patient Choice offered to / list presented to : Patient  Expected Discharge Plan and Services Expected Discharge Plan: [place]   Discharge Planning Services: CM Consult Post Acute Care Choice: [place]  arrangements for the past 2 months: Single Family Home                           HH Arranged: RN          Prior Living Arrangements/Services Living arrangements for the past 2 months: Single Family Home Lives with:: Self Patient language and need for interpreter reviewed:: Yes Do you feel safe going back to the place where you live?: Yes      Need for Family Participation in Patient Care: Yes (Comment) (pulmonary embolis) Care giver support system in place?: Yes (comment) (daughter in law, son)   Criminal Activity/Legal Involvement Pertinent to Current Situation/Hospitalization: No - Comment as needed  Activities of Daily Living Home Assistive Devices/Equipment: Blood pressure cuff ADL Screening (condition at time of admission) Patient's cognitive ability adequate to safely complete daily activities?: Yes Is the patient deaf or have difficulty hearing?: No Does the patient have difficulty seeing, even when wearing glasses/contacts?: No Does the patient have difficulty concentrating, remembering, or making decisions?: No Patient able to express need for assistance with ADLs?: Yes Does the patient have difficulty dressing or bathing?: No Independently performs ADLs?: Yes (appropriate for developmental age) Does the patient have difficulty walking or climbing stairs?: No Weakness of Legs: None Weakness of Arms/Hands: None  Permission Sought/Granted Permission sought to share information with : Case Manager, Other (comment) Permission granted to share information with : Yes,  Verbal Permission Granted     Permission granted to share info w AGENCY: Home Health agencies        Emotional Assessment Appearance:: Appears stated age Attitude/Demeanor/Rapport: Engaged Affect (typically observed): Accepting Orientation: : Oriented to Self, Oriented to Place, Oriented to  Time, Oriented to Situation Alcohol / Substance Use: Not Applicable Psych Involvement: No  (comment)  Admission diagnosis:  Acute pulmonary embolism (HCC) [I26.99] Acute pulmonary embolism without acute cor pulmonale, unspecified pulmonary embolism type (HCC) [I26.99] Patient Active Problem List   Diagnosis Date Noted  . Acute pulmonary embolism ([place]) 02/14/2020  . Chronic systolic CHF (congestive heart failure) ([place]) 02/14/2020  . Thrombocytopenia ([place]) 02/14/2020  . Hypothyroidism 02/14/2020  . Chest pain 12/29/2019  . Leukocytosis 12/29/2019  . HTN (hypertension) 12/29/2019  . HLD (hyperlipidemia) 12/29/2019  . Elevated troponin 12/29/2019  . CKD (chronic kidney disease), stage IIIa 12/29/2019  . Depression with anxiety 12/29/2019  . Abnormal cardiovascular stress test   . Dyspnea on exertion   . Goals of care, counseling/discussion 10/20/2019  . Peritoneal carcinomatosis ([place]) 10/20/2019  . Peritoneal metastases ([place]) 10/16/2019  . Flank pain 10/04/2019  . Right lower quadrant abdominal pain 10/04/2019  . Candidiasis 10/04/2019  . Right anterior knee pain 07/25/2019  . Educated about COVID-19 virus infection 11/15/2018  . Cramps, muscle, general 04/06/2018  . Chronic kidney disease, stage 2, mildly decreased GFR 10/05/2017  . B12 deficiency anemia 07/24/2017  . Normocytic anemia 07/18/2017  . Back pain 07/18/2017  . Chemotherapy-induced peripheral neuropathy ([place]) 12/10/2016  . Hyperlipidemia 08/13/2016  . Hx of colonic polyps 08/13/2016  . Benign neoplasm of sigmoid colon 08/13/2016  . Endometrial cancer ([place]) 08/13/2016  . 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 Lymphedema 07/20/2016  . Chronic venous insufficiency 07/20/2016  . PAD (peripheral artery disease) ([place]) 07/20/2016  . Postmenopausal bleeding 07/14/2016  . Class 1 obesity due to excess calories without serious comorbidity with body mass index (BMI) of 34.0 to 34.9 in adult 05/27/2016  . Leg cramps 05/27/2016  . Bronchiectasis ([place]) 12/19/2015  . Pre-syncope  12/19/2015  . Prolapsed, uterovaginal, incomplete 06/24/2014  . Routine general medical examination at a health care facility 12/30/2012  . Obesity (BMI 30-39.9) 12/30/2012  . Hx of multiple pulmonary nodules 12/28/2012  . Plantar fasciitis of right foot 12/28/2012  . 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 Hip pain, bilateral 09/12/2012  . History of Hodgkin's lymphoma 09/12/2012  . Coronary artery disease   . Chest pain on exertion 02/29/2012   PCP:  [name], MD Pharmacy:   [facility] [address] [place] [address] [place] [address] Phone: [phone] Fax: [place], [place] - [address] [place] [address] Phone: [phone] Fax: [phone]     Social Determinants of Health (SDOH) Interventions    Readmission Risk Interventions No flowsheet data found.

## 2020-02-15 NOTE — TOC Progression Note (Signed)
Transition of Care [facility]) - Progression Note    Patient Details  Name: Jaime Hall MRN: [id number] Date of Birth: May 17, 1951  Transition of Care [facility]) CM/SW [place], RN Phone Number: 02/15/2020, 3:06 PM  Clinical Narrative:    Provided patient with Eliquis coupon for medication assistance.   Expected Discharge Plan: [place] Barriers to Discharge: Continued Medical Work up  Expected Discharge Plan and Services Expected Discharge Plan: [place]   Discharge Planning Services: CM Consult Post Acute Care Choice: [place] arrangements for the past 2 months: Single Family Home                           HH Arranged: RN           Social Determinants of Health (SDOH) Interventions    Readmission Risk Interventions No flowsheet data found.

## 2020-02-16 ENCOUNTER — Telehealth: Payer: Self-pay | Admitting: Internal Medicine

## 2020-02-16 LAB — CORTISOL-AM, BLOOD: Cortisol - AM: 23.7 ug/dL — ABNORMAL HIGH (ref 6.7–22.6)

## 2020-02-16 LAB — CBC
HCT: 30.8 % — ABNORMAL LOW (ref 36.0–46.0)
Hemoglobin: 10.4 g/dL — ABNORMAL LOW (ref 12.0–15.0)
MCH: 32.2 pg (ref 26.0–34.0)
MCHC: 33.8 g/dL (ref 30.0–36.0)
MCV: 95.4 fL (ref 80.0–100.0)
Platelets: 86 10*3/uL — ABNORMAL LOW (ref 150–400)
RBC: 3.23 MIL/uL — ABNORMAL LOW (ref 3.87–5.11)
RDW: 17.5 % — ABNORMAL HIGH (ref 11.5–15.5)
WBC: 5.9 10*3/uL (ref 4.0–10.5)
nRBC: 0 % (ref 0.0–0.2)

## 2020-02-16 LAB — BASIC METABOLIC PANEL
Anion gap: 10 (ref 5–15)
BUN: 14 mg/dL (ref 8–23)
CO2: 20 mmol/L — ABNORMAL LOW (ref 22–32)
Calcium: 8 mg/dL — ABNORMAL LOW (ref 8.9–10.3)
Chloride: 109 mmol/L (ref 98–111)
Creatinine, Ser: 1.09 mg/dL — ABNORMAL HIGH (ref 0.44–1.00)
GFR calc Af Amer: 60 mL/min — ABNORMAL LOW (ref >60)
GFR calc non Af Amer: 51 mL/min — ABNORMAL LOW (ref >60)
Glucose, Bld: 147 mg/dL — ABNORMAL HIGH (ref 70–99)
Potassium: 3.3 mmol/L — ABNORMAL LOW (ref 3.5–5.1)
Sodium: 139 mmol/L (ref 135–145)

## 2020-02-16 MED ORDER — GUAIFENESIN ER 600 MG PO TB12: 600.0000 mg | ORAL_TABLET | Freq: Two times a day (BID) | ORAL | 0 refills | Status: DC

## 2020-02-16 MED ORDER — HYDROCOD POLST-CPM POLST ER 10-8 MG/5ML PO SUER
5.0000 mL | Freq: Two times a day (BID) | ORAL | 0 refills | Status: DC | PRN
Start: 2020-02-16 — End: 2020-02-28

## 2020-02-16 MED ORDER — PREDNISONE 10 MG (21) PO TBPK: ORAL_TABLET | ORAL | 0 refills | Status: DC

## 2020-02-16 MED ORDER — HEPARIN SOD (PORK) LOCK FLUSH 100 UNIT/ML IV SOLN
500.0000 [IU] | Freq: Once | INTRAVENOUS | Status: AC
  Administered 2020-02-16: 500 [IU] via INTRAVENOUS
  Filled 2020-02-16: qty 5

## 2020-02-16 MED ORDER — FUROSEMIDE 10 MG/ML IJ SOLN
40.0000 mg | INTRAMUSCULAR | Status: AC
  Administered 2020-02-16: 40 mg via INTRAVENOUS
  Filled 2020-02-16: qty 4

## 2020-02-16 NOTE — Care Management Important Message (Signed)
Important Message  Patient Details  Name: Jaime Hall MRN: [id number] Date of Birth: [birth year]   Medicare Important Message Given:  Yes  Initial Medicare IM given by Patient Access Associate on 02/15/2020 at 9:38am.    Dannette Barbara 02/16/2020, 9:28 AM

## 2020-02-16 NOTE — Telephone Encounter (Signed)
[facility] called scheduled pt hospital follow up for 02/23/20 @ 11am with [name].

## 2020-02-16 NOTE — Discharge Instructions (Signed)
Pulmonary Embolism  A pulmonary embolism (PE) is a sudden blockage or decrease of blood flow in one or both lungs. Most blockages come from a blood clot that forms in the vein of a lower leg, thigh, or arm (deep vein thrombosis, DVT) and travels to the lungs. A clot is blood that has thickened into a gel or solid. PE is a dangerous and life-threatening condition that needs to be treated right away. What are the causes? This condition is usually caused by a blood clot that forms in a vein and moves to the lungs. In rare cases, it may be caused by air, fat, part of a tumor, or other tissue that moves through the veins and into the lungs. What increases the risk? The following factors may make you more likely to develop this condition:  Experiencing a traumatic injury, such as breaking a hip or leg.  Having: ? A spinal cord injury. ? Orthopedic surgery, especially hip or knee replacement. ? Any major surgery. ? A stroke. ? DVT. ? Blood clots or blood clotting disease. ? Long-term (chronic) lung or heart disease. ? Cancer treated with chemotherapy. ? A central venous catheter.  Taking medicines that contain estrogen. These include birth control pills and hormone replacement therapy.  Being: ? Pregnant. ? In the period of time after your baby is delivered (postpartum). ? Older than age 60. ? Overweight. ? A smoker, especially if you have other risks. What are the signs or symptoms? Symptoms of this condition usually start suddenly and include:  Shortness of breath during activity or at rest.  Coughing, coughing up blood, or coughing up blood-tinged mucus.  Chest pain that is often worse with deep breaths.  Rapid or irregular heartbeat.  Feeling light-headed or dizzy.  Fainting.  Feeling anxious.  Fever.  Sweating.  Pain and swelling in a leg. This is a symptom of DVT, which can lead to PE. How is this diagnosed? This condition may be diagnosed based on:  Your medical  history.  A physical exam.  Blood tests.  CT pulmonary angiogram. This test checks blood flow in and around your lungs.  Ventilation-perfusion scan, also called a lung VQ scan. This test measures air flow and blood flow to the lungs.  An ultrasound of the legs. How is this treated? Treatment for this condition depends on many factors, such as the cause of your PE, your risk for bleeding or developing more clots, and other medical conditions you have. Treatment aims to remove, dissolve, or stop blood clots from forming or growing larger. Treatment may include:  Medicines, such as: ? Blood thinning medicines (anticoagulants) to stop clots from forming. ? Medicines that dissolve clots (thrombolytics).  Procedures, such as: ? Using a flexible tube to remove a blood clot (embolectomy) or to deliver medicine to destroy it (catheter-directed thrombolysis). ? Inserting a filter into a large vein that carries blood to the heart (inferior vena cava). This filter (vena cava filter) catches blood clots before they reach the lungs. ? Surgery to remove the clot (surgical embolectomy). This is rare. You may need a combination of immediate, long-term (up to 3 months after diagnosis), and extended (more than 3 months after diagnosis) treatments. Your treatment may continue for several months (maintenance therapy). You and your health care provider will work together to choose the treatment program that is best for you. Follow these instructions at home: Medicines  Take over-the-counter and prescription medicines only as told by your health care provider.  If you   are taking an anticoagulant medicine: ? Take the medicine every day at the same time each day. ? Understand what foods and drugs interact with your medicine. ? Understand the side effects of this medicine, including excessive bruising or bleeding. Ask your health care provider or pharmacist about other side effects. General  instructions  Wear a medical alert bracelet or carry a medical alert card that says you have had a PE and lists what medicines you take.  Ask your health care provider when you may return to your normal activities. Avoid sitting or lying for a long time without moving.  Maintain a healthy weight. Ask your health care provider what weight is healthy for you.  Do not use any products that contain nicotine or tobacco, such as cigarettes, e-cigarettes, and chewing tobacco. If you need help quitting, ask your health care provider.  Talk with your health care provider about any travel plans. It is important to make sure that you are still able to take your medicine while on trips.  Keep all follow-up visits as told by your health care provider. This is important. Contact a health care provider if:  You missed a dose of your blood thinner medicine. Get help right away if:  You have: ? New or increased pain, swelling, warmth, or redness in an arm or leg. ? Numbness or tingling in an arm or leg. ? Shortness of breath during activity or at rest. ? A fever. ? Chest pain. ? A rapid or irregular heartbeat. ? A severe headache. ? Vision changes. ? A serious fall or accident, or you hit your head. ? Stomach (abdominal) pain. ? Blood in your vomit, stool, or urine. ? A cut that will not stop bleeding.  You cough up blood.  You feel light-headed or dizzy.  You cannot move your arms or legs.  You are confused or have memory loss. These symptoms may represent a serious problem that is an emergency. Do not wait to see if the symptoms will go away. Get medical help right away. Call your local emergency services (911 in the [place] Do not drive yourself to the hospital. Summary  A pulmonary embolism (PE) is a sudden blockage or decrease of blood flow in one or both lungs. PE is a dangerous and life-threatening condition that needs to be treated right away.  Treatments for this condition usually  include medicines to thin your blood (anticoagulants) or medicines to break apart blood clots (thrombolytics).  If you are given blood thinners, it is important to take the medicine every day at the same time each day.  Understand what foods and drugs interact with any medicines that you are taking.  If you have signs of PE or DVT, call your local emergency services (911 in the [place] This information is not intended to replace advice given to you by your health care provider. Make sure you discuss any questions you have with your health care provider. Document Revised: 03/09/2018 Document Reviewed: 03/09/2018 Elsevier Patient Education  2020 [employer]  

## 2020-02-19 ENCOUNTER — Other Ambulatory Visit: Payer: Self-pay

## 2020-02-19 ENCOUNTER — Inpatient Hospital Stay
Admission: EM | Admit: 2020-02-19 | Discharge: 2020-02-22 | DRG: 291 | Disposition: A | Discharge status: Home Health | Payer: PPO | Attending: Internal Medicine | Admitting: Internal Medicine

## 2020-02-19 ENCOUNTER — Emergency Department: Payer: PPO

## 2020-02-19 DIAGNOSIS — I5043 Acute on chronic combined systolic (congestive) and diastolic (congestive) heart failure: Secondary | ICD-10-CM | POA: Diagnosis not present

## 2020-02-19 DIAGNOSIS — E785 Hyperlipidemia, unspecified: Secondary | ICD-10-CM | POA: Diagnosis not present

## 2020-02-19 DIAGNOSIS — Z8571 Personal history of Hodgkin lymphoma: Secondary | ICD-10-CM

## 2020-02-19 DIAGNOSIS — I2510 Atherosclerotic heart disease of native coronary artery without angina pectoris: Secondary | ICD-10-CM | POA: Diagnosis not present

## 2020-02-19 DIAGNOSIS — Z66 Do not resuscitate: Secondary | ICD-10-CM | POA: Diagnosis present

## 2020-02-19 DIAGNOSIS — I509 Heart failure, unspecified: Secondary | ICD-10-CM

## 2020-02-19 DIAGNOSIS — I10 Essential (primary) hypertension: Secondary | ICD-10-CM | POA: Diagnosis not present

## 2020-02-19 DIAGNOSIS — J449 Chronic obstructive pulmonary disease, unspecified: Secondary | ICD-10-CM | POA: Diagnosis not present

## 2020-02-19 DIAGNOSIS — E876 Hypokalemia: Secondary | ICD-10-CM | POA: Diagnosis not present

## 2020-02-19 DIAGNOSIS — Z9049 Acquired absence of other specified parts of digestive tract: Secondary | ICD-10-CM

## 2020-02-19 DIAGNOSIS — I5023 Acute on chronic systolic (congestive) heart failure: Secondary | ICD-10-CM | POA: Diagnosis not present

## 2020-02-19 DIAGNOSIS — R0603 Acute respiratory distress: Secondary | ICD-10-CM | POA: Diagnosis not present

## 2020-02-19 DIAGNOSIS — I959 Hypotension, unspecified: Secondary | ICD-10-CM | POA: Diagnosis not present

## 2020-02-19 DIAGNOSIS — D630 Anemia in neoplastic disease: Secondary | ICD-10-CM | POA: Diagnosis not present

## 2020-02-19 DIAGNOSIS — Z955 Presence of coronary angioplasty implant and graft: Secondary | ICD-10-CM

## 2020-02-19 DIAGNOSIS — I248 Other forms of acute ischemic heart disease: Secondary | ICD-10-CM | POA: Diagnosis not present

## 2020-02-19 DIAGNOSIS — J9601 Acute respiratory failure with hypoxia: Secondary | ICD-10-CM

## 2020-02-19 DIAGNOSIS — I130 Hypertensive heart and chronic kidney disease with heart failure and stage 1 through stage 4 chronic kidney disease, or unspecified chronic kidney disease: Principal | ICD-10-CM | POA: Diagnosis present

## 2020-02-19 DIAGNOSIS — Z20822 Contact with and (suspected) exposure to covid-19: Secondary | ICD-10-CM | POA: Diagnosis not present

## 2020-02-19 DIAGNOSIS — K219 Gastro-esophageal reflux disease without esophagitis: Secondary | ICD-10-CM | POA: Diagnosis not present

## 2020-02-19 DIAGNOSIS — Z86711 Personal history of pulmonary embolism: Secondary | ICD-10-CM

## 2020-02-19 DIAGNOSIS — Z7982 Long term (current) use of aspirin: Secondary | ICD-10-CM

## 2020-02-19 DIAGNOSIS — I110 Hypertensive heart disease with heart failure: Secondary | ICD-10-CM | POA: Diagnosis not present

## 2020-02-19 DIAGNOSIS — Z823 Family history of stroke: Secondary | ICD-10-CM

## 2020-02-19 DIAGNOSIS — R58 Hemorrhage, not elsewhere classified: Secondary | ICD-10-CM | POA: Diagnosis not present

## 2020-02-19 DIAGNOSIS — I2699 Other pulmonary embolism without acute cor pulmonale: Secondary | ICD-10-CM | POA: Diagnosis not present

## 2020-02-19 DIAGNOSIS — R0600 Dyspnea, unspecified: Secondary | ICD-10-CM | POA: Diagnosis not present

## 2020-02-19 DIAGNOSIS — J9621 Acute and chronic respiratory failure with hypoxia: Secondary | ICD-10-CM | POA: Diagnosis present

## 2020-02-19 DIAGNOSIS — I428 Other cardiomyopathies: Secondary | ICD-10-CM | POA: Diagnosis present

## 2020-02-19 DIAGNOSIS — C786 Secondary malignant neoplasm of retroperitoneum and peritoneum: Secondary | ICD-10-CM | POA: Diagnosis not present

## 2020-02-19 DIAGNOSIS — N179 Acute kidney failure, unspecified: Secondary | ICD-10-CM | POA: Diagnosis not present

## 2020-02-19 DIAGNOSIS — D696 Thrombocytopenia, unspecified: Secondary | ICD-10-CM | POA: Diagnosis present

## 2020-02-19 DIAGNOSIS — I2609 Other pulmonary embolism with acute cor pulmonale: Secondary | ICD-10-CM | POA: Diagnosis present

## 2020-02-19 DIAGNOSIS — Z923 Personal history of irradiation: Secondary | ICD-10-CM

## 2020-02-19 DIAGNOSIS — Z515 Encounter for palliative care: Secondary | ICD-10-CM | POA: Diagnosis not present

## 2020-02-19 DIAGNOSIS — E039 Hypothyroidism, unspecified: Secondary | ICD-10-CM | POA: Diagnosis present

## 2020-02-19 DIAGNOSIS — N1831 Chronic kidney disease, stage 3a: Secondary | ICD-10-CM | POA: Diagnosis not present

## 2020-02-19 DIAGNOSIS — Z803 Family history of malignant neoplasm of breast: Secondary | ICD-10-CM

## 2020-02-19 DIAGNOSIS — T451X5A Adverse effect of antineoplastic and immunosuppressive drugs, initial encounter: Secondary | ICD-10-CM | POA: Diagnosis present

## 2020-02-19 DIAGNOSIS — C541 Malignant neoplasm of endometrium: Secondary | ICD-10-CM | POA: Diagnosis not present

## 2020-02-19 DIAGNOSIS — I252 Old myocardial infarction: Secondary | ICD-10-CM

## 2020-02-19 DIAGNOSIS — R069 Unspecified abnormalities of breathing: Secondary | ICD-10-CM | POA: Diagnosis not present

## 2020-02-19 DIAGNOSIS — E7800 Pure hypercholesterolemia, unspecified: Secondary | ICD-10-CM | POA: Diagnosis not present

## 2020-02-19 DIAGNOSIS — R042 Hemoptysis: Secondary | ICD-10-CM | POA: Diagnosis present

## 2020-02-19 DIAGNOSIS — I447 Left bundle-branch block, unspecified: Secondary | ICD-10-CM | POA: Diagnosis present

## 2020-02-19 DIAGNOSIS — Z79899 Other long term (current) drug therapy: Secondary | ICD-10-CM

## 2020-02-19 DIAGNOSIS — I272 Pulmonary hypertension, unspecified: Secondary | ICD-10-CM | POA: Diagnosis present

## 2020-02-19 DIAGNOSIS — Z8542 Personal history of malignant neoplasm of other parts of uterus: Secondary | ICD-10-CM

## 2020-02-19 DIAGNOSIS — Z9221 Personal history of antineoplastic chemotherapy: Secondary | ICD-10-CM

## 2020-02-19 DIAGNOSIS — Z800 Family history of malignant neoplasm of digestive organs: Secondary | ICD-10-CM

## 2020-02-19 DIAGNOSIS — R0602 Shortness of breath: Secondary | ICD-10-CM | POA: Diagnosis not present

## 2020-02-19 DIAGNOSIS — Z79890 Hormone replacement therapy (postmenopausal): Secondary | ICD-10-CM

## 2020-02-19 DIAGNOSIS — Z807 Family history of other malignant neoplasms of lymphoid, hematopoietic and related tissues: Secondary | ICD-10-CM

## 2020-02-19 DIAGNOSIS — R0902 Hypoxemia: Secondary | ICD-10-CM | POA: Diagnosis not present

## 2020-02-19 DIAGNOSIS — I472 Ventricular tachycardia: Secondary | ICD-10-CM | POA: Diagnosis not present

## 2020-02-19 DIAGNOSIS — F419 Anxiety disorder, unspecified: Secondary | ICD-10-CM | POA: Diagnosis present

## 2020-02-19 DIAGNOSIS — Z90710 Acquired absence of both cervix and uterus: Secondary | ICD-10-CM

## 2020-02-19 DIAGNOSIS — Z833 Family history of diabetes mellitus: Secondary | ICD-10-CM

## 2020-02-19 DIAGNOSIS — Z882 Allergy status to sulfonamides status: Secondary | ICD-10-CM

## 2020-02-19 DIAGNOSIS — Z87891 Personal history of nicotine dependence: Secondary | ICD-10-CM

## 2020-02-19 DIAGNOSIS — Z7901 Long term (current) use of anticoagulants: Secondary | ICD-10-CM

## 2020-02-19 LAB — CBC WITH DIFFERENTIAL/PLATELET
Abs Immature Granulocytes: 0.08 10*3/uL — ABNORMAL HIGH (ref 0.00–0.07)
Basophils Absolute: 0 10*3/uL (ref 0.0–0.1)
Basophils Relative: 0 %
Eosinophils Absolute: 0 10*3/uL (ref 0.0–0.5)
Eosinophils Relative: 0 %
HCT: 30.7 % — ABNORMAL LOW (ref 36.0–46.0)
Hemoglobin: 9.9 g/dL — ABNORMAL LOW (ref 12.0–15.0)
Immature Granulocytes: 1 %
Lymphocytes Relative: 7 %
Lymphs Abs: 1.1 10*3/uL (ref 0.7–4.0)
MCH: 31.6 pg (ref 26.0–34.0)
MCHC: 32.2 g/dL (ref 30.0–36.0)
MCV: 98.1 fL (ref 80.0–100.0)
Monocytes Absolute: 0.8 10*3/uL (ref 0.1–1.0)
Monocytes Relative: 5 %
Neutro Abs: 13.6 10*3/uL — ABNORMAL HIGH (ref 1.7–7.7)
Neutrophils Relative %: 87 %
Platelets: 138 10*3/uL — ABNORMAL LOW (ref 150–400)
RBC: 3.13 MIL/uL — ABNORMAL LOW (ref 3.87–5.11)
RDW: 17.9 % — ABNORMAL HIGH (ref 11.5–15.5)
WBC: 15.6 10*3/uL — ABNORMAL HIGH (ref 4.0–10.5)
nRBC: 0.3 % — ABNORMAL HIGH (ref 0.0–0.2)

## 2020-02-19 LAB — COMPREHENSIVE METABOLIC PANEL
ALT: 18 U/L (ref 0–44)
AST: 31 U/L (ref 15–41)
Albumin: 3.6 g/dL (ref 3.5–5.0)
Alkaline Phosphatase: 63 U/L (ref 38–126)
Anion gap: 13 (ref 5–15)
BUN: 26 mg/dL — ABNORMAL HIGH (ref 8–23)
CO2: 25 mmol/L (ref 22–32)
Calcium: 8.7 mg/dL — ABNORMAL LOW (ref 8.9–10.3)
Chloride: 104 mmol/L (ref 98–111)
Creatinine, Ser: 1.43 mg/dL — ABNORMAL HIGH (ref 0.44–1.00)
GFR calc Af Amer: 43 mL/min — ABNORMAL LOW (ref >60)
GFR calc non Af Amer: 37 mL/min — ABNORMAL LOW (ref >60)
Glucose, Bld: 118 mg/dL — ABNORMAL HIGH (ref 70–99)
Potassium: 3.3 mmol/L — ABNORMAL LOW (ref 3.5–5.1)
Sodium: 142 mmol/L (ref 135–145)
Total Bilirubin: 2.3 mg/dL — ABNORMAL HIGH (ref 0.3–1.2)
Total Protein: 6.4 g/dL — ABNORMAL LOW (ref 6.5–8.1)

## 2020-02-19 LAB — BLOOD GAS, VENOUS
Acid-Base Excess: 2.9 mmol/L — ABNORMAL HIGH (ref 0.0–2.0)
Bicarbonate: 28.9 mmol/L — ABNORMAL HIGH (ref 20.0–28.0)
O2 Saturation: 61.4 %
Patient temperature: 37
pCO2, Ven: 50 mmHg (ref 44.0–60.0)
pH, Ven: 7.37 (ref 7.250–7.430)
pO2, Ven: 33 mmHg (ref 32.0–45.0)

## 2020-02-19 LAB — APTT: aPTT: 32 seconds (ref 24–36)

## 2020-02-19 LAB — TROPONIN I (HIGH SENSITIVITY): Troponin I (High Sensitivity): 24 ng/L — ABNORMAL HIGH (ref <18)

## 2020-02-19 LAB — PROTIME-INR
INR: 2 — ABNORMAL HIGH (ref 0.8–1.2)
Prothrombin Time: 21.8 seconds — ABNORMAL HIGH (ref 11.4–15.2)

## 2020-02-19 LAB — BRAIN NATRIURETIC PEPTIDE: B Natriuretic Peptide: 2481.1 pg/mL — ABNORMAL HIGH (ref 0.0–100.0)

## 2020-02-19 LAB — LACTIC ACID, PLASMA: Lactic Acid, Venous: 3.5 mmol/L (ref 0.5–1.9)

## 2020-02-19 LAB — SARS CORONAVIRUS 2 BY RT PCR (HOSPITAL ORDER, PERFORMED IN ~~LOC~~ HOSPITAL LAB): SARS Coronavirus 2: NEGATIVE

## 2020-02-19 MED ORDER — LOSARTAN POTASSIUM 25 MG PO TABS
12.5000 mg | ORAL_TABLET | Freq: Every day | ORAL | Status: DC
  Administered 2020-02-20 – 2020-02-22 (×3): 12.5 mg via ORAL
  Filled 2020-02-19: qty 1
  Filled 2020-02-19: qty 0.5
  Filled 2020-02-19: qty 1
  Filled 2020-02-19: qty 0.5

## 2020-02-19 MED ORDER — LACTATED RINGERS IV SOLN: INTRAVENOUS | Status: DC

## 2020-02-19 MED ORDER — ASPIRIN EC 81 MG PO TBEC: 81.0000 mg | DELAYED_RELEASE_TABLET | Freq: Every day | ORAL | Status: DC

## 2020-02-19 MED ORDER — VITAMIN D 25 MCG (1000 UNIT) PO TABS
1000.0000 [IU] | ORAL_TABLET | Freq: Every day | ORAL | Status: DC
  Administered 2020-02-20 – 2020-02-22 (×3): 1000 [IU] via ORAL
  Filled 2020-02-19 (×3): qty 1

## 2020-02-19 MED ORDER — PANTOPRAZOLE SODIUM 20 MG PO TBEC
20.0000 mg | DELAYED_RELEASE_TABLET | Freq: Every day | ORAL | Status: DC
  Administered 2020-02-20 – 2020-02-22 (×2): 20 mg via ORAL
  Filled 2020-02-19 (×3): qty 1

## 2020-02-19 MED ORDER — CARVEDILOL 3.125 MG PO TABS
3.1250 mg | ORAL_TABLET | Freq: Two times a day (BID) | ORAL | Status: DC
  Administered 2020-02-20 – 2020-02-22 (×6): 3.125 mg via ORAL
  Filled 2020-02-19 (×6): qty 1

## 2020-02-19 MED ORDER — SODIUM CHLORIDE 0.9% FLUSH
3.0000 mL | Freq: Two times a day (BID) | INTRAVENOUS | Status: DC
  Administered 2020-02-20 – 2020-02-22 (×5): 3 mL via INTRAVENOUS

## 2020-02-19 MED ORDER — FUROSEMIDE 10 MG/ML IJ SOLN
40.0000 mg | Freq: Once | INTRAMUSCULAR | Status: AC
  Administered 2020-02-20: 40 mg via INTRAVENOUS
  Filled 2020-02-19: qty 4

## 2020-02-19 MED ORDER — SODIUM CHLORIDE 0.9 % IV SOLN
2.0000 g | Freq: Once | INTRAVENOUS | Status: AC
  Administered 2020-02-20: 2 g via INTRAVENOUS
  Filled 2020-02-19: qty 2

## 2020-02-19 MED ORDER — VANCOMYCIN HCL IN DEXTROSE 1-5 GM/200ML-% IV SOLN
1000.0000 mg | Freq: Once | INTRAVENOUS | Status: AC
  Administered 2020-02-20: 1000 mg via INTRAVENOUS
  Filled 2020-02-19: qty 200

## 2020-02-19 MED ORDER — LORAZEPAM 0.5 MG PO TABS
0.5000 mg | ORAL_TABLET | Freq: Four times a day (QID) | ORAL | Status: DC | PRN
  Administered 2020-02-20: 0.5 mg via ORAL
  Filled 2020-02-19: qty 1

## 2020-02-19 MED ORDER — LENVATINIB (10 MG DAILY DOSE) 10 MG PO CPPK: 10.0000 mg | ORAL_CAPSULE | Freq: Every evening | ORAL | Status: DC

## 2020-02-19 MED ORDER — NYSTATIN 100000 UNIT/GM EX POWD
1.0000 "application " | Freq: Four times a day (QID) | CUTANEOUS | Status: DC
  Administered 2020-02-22: 1 via TOPICAL
  Filled 2020-02-19 (×2): qty 15

## 2020-02-19 MED ORDER — SODIUM CHLORIDE 0.9 % IV SOLN: 250.0000 mL | INTRAVENOUS | Status: DC | PRN

## 2020-02-19 MED ORDER — SODIUM CHLORIDE 0.9 % IV BOLUS: 500.0000 mL | Freq: Once | INTRAVENOUS | Status: DC

## 2020-02-19 MED ORDER — LEVOTHYROXINE SODIUM 50 MCG PO TABS
50.0000 ug | ORAL_TABLET | Freq: Every day | ORAL | Status: DC
  Administered 2020-02-20: 50 ug via ORAL
  Filled 2020-02-19: qty 1

## 2020-02-19 MED ORDER — ASPIRIN EC 81 MG PO TBEC
81.0000 mg | DELAYED_RELEASE_TABLET | Freq: Every day | ORAL | Status: DC
  Filled 2020-02-19: qty 1

## 2020-02-19 MED ORDER — DULOXETINE HCL 30 MG PO CPEP
30.0000 mg | ORAL_CAPSULE | Freq: Every day | ORAL | Status: DC
  Administered 2020-02-20 – 2020-02-22 (×3): 30 mg via ORAL
  Filled 2020-02-19 (×4): qty 1

## 2020-02-19 MED ORDER — CYANOCOBALAMIN 1000 MCG/ML IJ SOLN: 1000.0000 ug | INTRAMUSCULAR | Status: DC

## 2020-02-19 MED ORDER — FUROSEMIDE 40 MG PO TABS: 20.0000 mg | ORAL_TABLET | Freq: Every day | ORAL | Status: DC

## 2020-02-19 MED ORDER — ZOLPIDEM TARTRATE 5 MG PO TABS: 5.0000 mg | ORAL_TABLET | Freq: Every evening | ORAL | Status: DC | PRN

## 2020-02-19 MED ORDER — ONDANSETRON HCL 4 MG/2ML IJ SOLN: 4.0000 mg | Freq: Four times a day (QID) | INTRAMUSCULAR | Status: DC | PRN

## 2020-02-19 MED ORDER — GUAIFENESIN ER 600 MG PO TB12
600.0000 mg | ORAL_TABLET | Freq: Two times a day (BID) | ORAL | Status: DC
  Administered 2020-02-20 – 2020-02-22 (×6): 600 mg via ORAL
  Filled 2020-02-19 (×6): qty 1

## 2020-02-19 MED ORDER — APIXABAN 5 MG PO TABS: 5.0000 mg | ORAL_TABLET | Freq: Two times a day (BID) | ORAL | Status: DC

## 2020-02-19 MED ORDER — ATORVASTATIN CALCIUM 10 MG PO TABS
10.0000 mg | ORAL_TABLET | Freq: Every evening | ORAL | Status: DC
  Administered 2020-02-20 – 2020-02-22 (×2): 10 mg via ORAL
  Filled 2020-02-19 (×2): qty 1

## 2020-02-19 MED ORDER — SODIUM CHLORIDE 0.9% FLUSH: 3.0000 mL | INTRAVENOUS | Status: DC | PRN

## 2020-02-19 MED ORDER — APIXABAN 5 MG PO TABS
10.0000 mg | ORAL_TABLET | Freq: Two times a day (BID) | ORAL | Status: DC
  Filled 2020-02-19: qty 2

## 2020-02-19 MED ORDER — ALPRAZOLAM 0.25 MG PO TABS: 0.2500 mg | ORAL_TABLET | Freq: Two times a day (BID) | ORAL | Status: DC | PRN

## 2020-02-19 MED ORDER — ACETAMINOPHEN 325 MG PO TABS: 650.0000 mg | ORAL_TABLET | ORAL | Status: DC | PRN

## 2020-02-19 MED ORDER — HYDROCOD POLST-CPM POLST ER 10-8 MG/5ML PO SUER
5.0000 mL | Freq: Two times a day (BID) | ORAL | Status: DC | PRN
  Administered 2020-02-20 (×2): 5 mL via ORAL
  Filled 2020-02-19 (×2): qty 5

## 2020-02-19 NOTE — ED Notes (Signed)
Established IV blew after arrival blood draws. MD made aware, IV team called, port being accessed by staff

## 2020-02-19 NOTE — ED Notes (Signed)
Port accessed by this Best boy. 20ga 0.75 in power loc placed.  Patient tolerated well.

## 2020-02-19 NOTE — ED Provider Notes (Signed)
[facility] Emergency Department Provider Note    First MD Initiated Contact with Patient 02/19/20 2112     (approximate)  I have reviewed the triage vital signs and the nursing notes.   HISTORY  Chief Complaint Shortness of Breath    HPI Jaime Hall is a 69 y.o. female extensive past medical history as listed below presents to the ER for evaluation of worsening malaise cough shortness of breath found to be hypoxic.  Was recently hospitalized diagnosed with subsegmental PE started on Eliquis.  States that he is having frequent productive cough and has had some streaks of blood in her phlegm.  Has been compliant with her Eliquis.  No major temperatures or fevers.  In route EMS found the patient to be hypoxic to the high 70s placed.  Was placed on nasal cannula and was given nebulizer and patient does feel that that is improving her breathing.    Past Medical History:  Diagnosis Date  . Anxiety   . Cervicalgia   . CHF (congestive heart failure) ([place])   . Chronic kidney disease   . Coronary artery disease    a. 02/2012 Stress echo: severe anterior wall ischemia;  b. 02/2012 Cath/PCI: LAD 95p (3.0 x 15 Xience EX DES), D1 90ost (PTCA - bifurcational dzs), EF 45% with anterior HK;  b. 02/2013 Ex MV: fixed anterior defect w/ minor reversibility, nl EF-->Med Rx.  . Endometrial cancer ([place])    a. 07/2016 s/p robotic hysterectomy, BSO w/ washings, sentinel node inj, mapping, bx, adhesiolysis.  . Essential hypertension, benign   . Fibrocystic breast disease   . GERD (gastroesophageal reflux disease)   . Gestational hypertension   . Heart murmur   . History of anemia   . History of blood transfusion   . Hodgkin's lymphoma ([place]) 2011   a. s/p radiation and chemo therapy  . Osteoarthritis   . Polycystic ovarian disease    Family History  Problem Relation Age of Onset  . ALS Father   . Polymyositis Father   . Diabetes Brother   . Cancer Maternal Aunt         breast  . Breast cancer Maternal Aunt        30's  . Stroke Maternal Grandmother   . Cancer Maternal Grandfather        prostate  . Stroke Maternal Grandfather   . Colon cancer Maternal Aunt   . Non-Hodgkin's lymphoma Cousin    Past Surgical History:  Procedure Laterality Date  . ABDOMINAL HYSTERECTOMY    . bladder sling    . CARDIAC CATHETERIZATION  02/2012   [facility] 1 stent place  . CERVICAL POLYPECTOMY    . CHOLECYSTECTOMY  1982  . COLONOSCOPY WITH PROPOFOL N/A 02/05/2015   Procedure: COLONOSCOPY WITH PROPOFOL;  Surgeon: Lucilla Lame, MD;  Location: [facility] ENDOSCOPY;  Service: Endoscopy;  Laterality: N/A;  . CORONARY ANGIOPLASTY  02/2012   left/right s/p balloon  . CYSTOGRAM N/A 08/17/2016   Procedure: CYSTOGRAM;  Surgeon: Hollice Espy, MD;  Location: [facility] ORS;  Service: Urology;  Laterality: N/A;  . CYSTOSCOPY N/A 08/17/2016   Procedure: CYSTOSCOPY EXAM UNDER ANESTHESIA;  Surgeon: Hollice Espy, MD;  Location: [facility] ORS;  Service: Urology;  Laterality: N/A;  . CYSTOSCOPY W/ RETROGRADES Bilateral 08/17/2016   Procedure: CYSTOSCOPY WITH RETROGRADE PYELOGRAM;  Surgeon: Hollice Espy, MD;  Location: [facility] ORS;  Service: Urology;  Laterality: Bilateral;  . CYSTOSCOPY WITH STENT PLACEMENT Right 08/17/2016   Procedure: CYSTOSCOPY WITH STENT  PLACEMENT;  Surgeon: Hollice Espy, MD;  Location: [facility] ORS;  Service: Urology;  Laterality: Right;  . heart stent'  2013  . kidney stent Right 2018  . LYMPH NODE BIOPSY  2011   diagnosis of hodgkins lymphoma  . PELVIC LYMPH NODE DISSECTION N/A 07/29/2016   Procedure: PELVIC/AORTIC LYMPH NODE SAMPLING;  Surgeon: [name], MD;  Location: [facility] ORS;  Service: Gynecology;  Laterality: N/A;  . PORTA CATH INSERTION N/A 09/22/2016   Procedure: Glori Luis Cath Insertion;  Surgeon: [name], MD;  Location: [place] [facility];  Service: Cardiovascular;  Laterality: N/A;  . PORTA CATH INSERTION N/A 10/27/2019   Procedure: PORTA CATH INSERTION;   Surgeon: [name], MD;  Location: [place] [facility];  Service: Cardiovascular;  Laterality: N/A;  . PORTA CATH REMOVAL N/A 11/17/2016   Procedure: Glori Luis Cath Removal;  Surgeon: [name], MD;  Location: [place] [facility];  Service: Cardiovascular;  Laterality: N/A;  . RIGHT/LEFT HEART CATH AND CORONARY ANGIOGRAPHY N/A 12/04/2019   Procedure: RIGHT/LEFT HEART CATH AND CORONARY ANGIOGRAPHY;  Surgeon: Wellington Hampshire, MD;  Location: [place] [facility];  Service: Cardiovascular;  Laterality: N/A;  . ROBOTIC ASSISTED TOTAL HYSTERECTOMY WITH BILATERAL SALPINGO OOPHERECTOMY N/A 07/29/2016   Procedure: ROBOTIC ASSISTED TOTAL HYSTERECTOMY WITH BILATERAL SALPINGO OOPHORECTOMY;  Surgeon: [name], MD;  Location: [facility] ORS;  Service: Gynecology;  Laterality: N/A;  . SENTINEL NODE BIOPSY N/A 07/29/2016   Procedure: SENTINEL NODE BIOPSY;  Surgeon: [name], MD;  Location: [facility] ORS;  Service: Gynecology;  Laterality: N/A;  . transobturator sling N/A 2009   [place]   Patient Active Problem List   Diagnosis Date Noted  . Acute CHF (congestive heart failure) ([place]) 02/19/2020  . Acute pulmonary embolism ([place]) 02/14/2020  . Chronic systolic CHF (congestive heart failure) ([place]) 02/14/2020  . Thrombocytopenia ([place]) 02/14/2020  . Hypothyroidism 02/14/2020  . Chest pain 12/29/2019  . Leukocytosis 12/29/2019  . HTN (hypertension) 12/29/2019  . HLD (hyperlipidemia) 12/29/2019  . Elevated troponin 12/29/2019  . CKD (chronic kidney disease), stage IIIa 12/29/2019  . Depression with anxiety 12/29/2019  . Abnormal cardiovascular stress test   . Dyspnea on exertion   . Goals of care, counseling/discussion 10/20/2019  . 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 Peritoneal metastases ([place]) 10/16/2019  . Flank pain 10/04/2019  . Right lower quadrant abdominal pain 10/04/2019  . Candidiasis 10/04/2019  . Right anterior knee pain 07/25/2019  . Educated about  COVID-19 virus infection 11/15/2018  . Cramps, muscle, general 04/06/2018  . Chronic kidney disease, stage 2, mildly decreased GFR 10/05/2017  . B12 deficiency anemia 07/24/2017  . Normocytic anemia 07/18/2017  . Back pain 07/18/2017  . Chemotherapy-induced peripheral neuropathy ([place]) 12/10/2016  . Hyperlipidemia 08/13/2016  . Hx of colonic polyps 08/13/2016  . Benign neoplasm of sigmoid colon 08/13/2016  . Endometrial cancer ([place]) 08/13/2016  . 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 Lymphedema 07/20/2016  . Chronic venous insufficiency 07/20/2016  . PAD (peripheral artery disease) ([place]) 07/20/2016  . Postmenopausal bleeding 07/14/2016  . Class 1 obesity due to excess calories without serious comorbidity with body mass index (BMI) of 34.0 to 34.9 in adult 05/27/2016  . Leg cramps 05/27/2016  . Bronchiectasis ([place]) 12/19/2015  . Pre-syncope 12/19/2015  . Prolapsed, uterovaginal, incomplete 06/24/2014  . Routine general medical examination at a health care facility 12/30/2012  . Obesity (BMI 30-39.9) 12/30/2012  . Hx of multiple pulmonary nodules 12/28/2012  .  Plantar fasciitis of right foot 12/28/2012  . 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 Hip pain, bilateral 09/12/2012  . History of Hodgkin's lymphoma 09/12/2012  . Coronary artery disease   . Chest pain on exertion 02/29/2012      Prior to Admission medications   Medication Sig Start Date End Date Taking? Authorizing Provider  apixaban (ELIQUIS) 5 MG TABS tablet Take 2 tablets (10 mg total) by mouth 2 (two) times daily. 10 mg po BID for 7 days followed by 5 mg po BID 02/16/20   Max Sane, MD  apixaban (ELIQUIS) 5 MG TABS tablet Take 1 tablet (5 mg total) by mouth 2 (two) times daily. 10 mg po BID for 7 days followed by 5 mg po BID 02/22/20   Max Sane, MD  aspirin EC 81 MG tablet Take 81 mg by mouth at bedtime.    [provider]  atorvastatin (LIPITOR) 10 MG tablet  Take 1 tablet by mouth once daily Patient taking differently: Take 10 mg by mouth every evening.  10/23/19   Wellington Hampshire, MD  carvedilol (COREG) 3.125 MG tablet Take 1 tablet (3.125 mg total) by mouth 2 (two) times daily with a meal. 02/05/20   Loel Dubonnet, NP  chlorpheniramine-HYDROcodone (TUSSIONEX PENNKINETIC ER) 10-8 MG/5ML SUER Take 5 mLs by mouth every 12 (twelve) hours as needed for cough. 02/16/20   Max Sane, MD  cholecalciferol (VITAMIN D3) 25 MCG (1000 UNIT) tablet Take 1,000 Units by mouth in the morning and at bedtime.    [provider]  cyanocobalamin (,VITAMIN B-12,) 1000 MCG/ML injection INJECT 1ML IM WEEKLY FOR 4 WEEKS, THEN INJECT MONTHLY THEREAFTER Patient taking differently: Inject 1,000 mcg into the skin every 30 (thirty) days.  09/05/19   [name], MD  DULoxetine (CYMBALTA) 30 MG capsule Take 1 capsule (30 mg total) by mouth daily. 02/01/20   Sindy Guadeloupe, MD  furosemide (LASIX) 20 MG tablet Take 1 tablet (20 mg total) by mouth as needed for fluid or edema (Take one tablet daily as needed for lower extremity edema or for weight gain of 2 lbs overnight or 5 lbs in one week.). Patient taking differently: Take 20 mg by mouth daily.  12/20/19   Loel Dubonnet, NP  guaiFENesin (MUCINEX) 600 MG 12 hr tablet Take 1 tablet (600 mg total) by mouth 2 (two) times daily for 5 days. 02/16/20 02/21/20  Max Sane, MD  lenvatinib 10 mg daily dose (LENVIMA, 10 MG DAILY DOSE,) capsule Take 1 capsule (10 mg total) by mouth daily. Patient taking differently: Take 10 mg by mouth every evening.  02/01/20   Sindy Guadeloupe, MD  levothyroxine (SYNTHROID) 50 MCG tablet Take 1 tablet (50 mcg total) by mouth daily before breakfast. 01/17/20   Cammie Sickle, MD  LORazepam (ATIVAN) 0.5 MG tablet Take 1 tablet (0.5 mg total) by mouth every 6 (six) hours as needed for anxiety (and to help with breathing). 02/01/20   Sindy Guadeloupe, MD  losartan (COZAAR) 25 MG tablet Take 0.5  tablets (12.5 mg total) by mouth daily. 02/05/20 05/05/20  Loel Dubonnet, NP  nystatin (MYCOSTATIN/NYSTOP) powder Apply 1 application topically 4 (four) times daily. To affect areas for yeast infection of the skin. 02/07/20   Verlon Au, NP  pantoprazole (PROTONIX) 20 MG tablet Take 1 tablet (20 mg total) by mouth daily. 12/20/19   Jacquelin Hawking, NP  predniSONE (STERAPRED UNI-PAK 21 TAB) 10 MG (21) TBPK tablet  Start 60 mg po daily, taper 10 mg daily until done 02/16/20   Max Sane, MD  prochlorperazine (COMPAZINE) 10 MG tablet Take 1 tablet (10 mg total) by mouth every 6 (six) hours as needed (Nausea or vomiting). Patient taking differently: Take 10 mg by mouth daily as needed for nausea or vomiting.  10/25/19 01/04/20  Sindy Guadeloupe, MD    Allergies Adhesive [tape], Antifungal [miconazole nitrate], Sulfa antibiotics, and Z-pak [azithromycin]    Social History Social History   Tobacco Use  . Smoking status: Former Smoker    Packs/day: 1.00    Years: 30.00    Pack years: 30.00    Types: Cigarettes    Quit date: 07/24/2002    Years since quitting: 17.5  . Smokeless tobacco: Never Used  . Tobacco comment: quit smoking in 2000  Vaping Use  . Vaping Use: Never used  Substance Use Topics  . Alcohol use: Not Currently  . Drug use: No    Review of Systems Patient denies headaches, rhinorrhea, blurry vision, numbness, shortness of breath, chest pain, edema, cough, abdominal pain, nausea, vomiting, diarrhea, dysuria, fevers, rashes or hallucinations unless otherwise stated above in HPI. ____________________________________________   PHYSICAL EXAM:  VITAL SIGNS: Vitals:   02/19/20 2119 02/19/20 2211  BP:    Pulse:    Resp:    Temp: 98 F (36.7 C)   SpO2:  95%    Constitutional: Alert and oriented in mild respiratory dstress, protecting her airway  Eyes: Conjunctivae are normal.  Head: Atraumatic. Nose: No congestion/rhinnorhea. Mouth/Throat: Mucous membranes are  moist.   Neck: No stridor. Painless ROM.  Cardiovascular: mildly tachycardic rate, regular rhythm. Grossly normal heart sounds.  Good peripheral circulation. Respiratory: mild tachypnea, diffuse inspiratory crackles and coarse wheeze throughout.  No retractions. Gastrointestinal: Soft and nontender. No distention. No abdominal bruits. No CVA tenderness. Genitourinary:  Musculoskeletal: No lower extremity tenderness, 2+ edema.  No joint effusions. Neurologic:  Normal speech and language. No gross focal neurologic deficits are appreciated. No facial droop Skin:  Skin is warm, dry and intact. No rash noted. Psychiatric: Mood and affect are normal. Speech and behavior are normal.  ____________________________________________   LABS (all labs ordered are listed, but only abnormal results are displayed)  Results for orders placed or performed during the hospital encounter of 02/19/20 (from the past 24 hour(s))  CBC with Differential/Platelet     Status: Abnormal   Collection Time: 02/19/20  9:25 PM  Result Value Ref Range   WBC 15.6 (H) 4.0 - 10.5 K/uL   RBC 3.13 (L) 3.87 - 5.11 MIL/uL   Hemoglobin 9.9 (L) 12.0 - 15.0 g/dL   HCT 30.7 (L) 36 - 46 %   MCV 98.1 80.0 - 100.0 fL   MCH 31.6 26.0 - 34.0 pg   MCHC 32.2 30.0 - 36.0 g/dL   RDW 17.9 (H) 11.5 - 15.5 %   Platelets 138 (L) 150 - 400 K/uL   nRBC 0.3 (H) 0.0 - 0.2 %   Neutrophils Relative % 87 %   Neutro Abs 13.6 (H) 1.7 - 7.7 K/uL   Lymphocytes Relative 7 %   Lymphs Abs 1.1 0.7 - 4.0 K/uL   Monocytes Relative 5 %   Monocytes Absolute 0.8 0 - 1 K/uL   Eosinophils Relative 0 %   Eosinophils Absolute 0.0 0 - 0 K/uL   Basophils Relative 0 %   Basophils Absolute 0.0 0 - 0 K/uL   Immature Granulocytes 1 %   Abs Immature  Granulocytes 0.08 (H) 0.00 - 0.07 K/uL  Comprehensive metabolic panel     Status: Abnormal   Collection Time: 02/19/20  9:25 PM  Result Value Ref Range   Sodium 142 135 - 145 mmol/L   Potassium 3.3 (L) 3.5 - 5.1  mmol/L   Chloride 104 98 - 111 mmol/L   CO2 25 22 - 32 mmol/L   Glucose, Bld 118 (H) 70 - 99 mg/dL   BUN 26 (H) 8 - 23 mg/dL   Creatinine, Ser 1.43 (H) 0.44 - 1.00 mg/dL   Calcium 8.7 (L) 8.9 - 10.3 mg/dL   Total Protein 6.4 (L) 6.5 - 8.1 g/dL   Albumin 3.6 3.5 - 5.0 g/dL   AST 31 15 - 41 U/L   ALT 18 0 - 44 U/L   Alkaline Phosphatase 63 38 - 126 U/L   Total Bilirubin 2.3 (H) 0.3 - 1.2 mg/dL   GFR calc non Af Amer 37 (L) >60 mL/min   GFR calc Af Amer 43 (L) >60 mL/min   Anion gap 13 5 - 15  Brain natriuretic peptide     Status: Abnormal   Collection Time: 02/19/20  9:25 PM  Result Value Ref Range   B Natriuretic Peptide 2,481.1 (H) 0.0 - 100.0 pg/mL  Troponin I (High Sensitivity)     Status: Abnormal   Collection Time: 02/19/20  9:25 PM  Result Value Ref Range   Troponin I (High Sensitivity) 24 (H) <18 ng/L  Blood gas, venous     Status: Abnormal   Collection Time: 02/19/20  9:25 PM  Result Value Ref Range   pH, Ven 7.37 7.25 - 7.43   pCO2, Ven 50 44 - 60 mmHg   pO2, Ven 33.0 32 - 45 mmHg   Bicarbonate 28.9 (H) 20.0 - 28.0 mmol/L   Acid-Base Excess 2.9 (H) 0.0 - 2.0 mmol/L   O2 Saturation 61.4 %   Patient temperature 37.0    Collection site LINE    Sample type VENOUS   Lactic acid, plasma     Status: Abnormal   Collection Time: 02/19/20  9:25 PM  Result Value Ref Range   Lactic Acid, Venous 3.5 (HH) 0.5 - 1.9 mmol/L  SARS Coronavirus 2 by RT PCR (hospital order, performed in [place] hospital lab) Nasopharyngeal Nasopharyngeal Swab     Status: None   Collection Time: 02/19/20  9:25 PM   Specimen: Nasopharyngeal Swab  Result Value Ref Range   SARS Coronavirus 2 NEGATIVE NEGATIVE  Protime-INR     Status: Abnormal   Collection Time: 02/19/20  9:25 PM  Result Value Ref Range   Prothrombin Time 21.8 (H) 11.4 - 15.2 seconds   INR 2.0 (H) 0.8 - 1.2  APTT     Status: None   Collection Time: 02/19/20  9:25 PM  Result Value Ref Range   aPTT 32 24 - 36 seconds    ____________________________________________  EKG My review and personal interpretation at Time: 21:12   Indication: sob  Rate: 105  Rhythm: sinus Axis: right Other: suspect lead reversal, no stemi ____________________________________________  RADIOLOGY  I personally reviewed all radiographic images ordered to evaluate for the above acute complaints and reviewed radiology reports and findings.  These findings were personally discussed with the patient.  Please see medical record for radiology report.  ____________________________________________   PROCEDURES  Procedure(s) performed:  .Critical Care Performed by: Merlyn Lot, MD Authorized by: Merlyn Lot, MD   Critical care provider statement:    Critical care  time (minutes):  35   Critical care time was exclusive of:  Separately billable procedures and treating other patients   Critical care was necessary to treat or prevent imminent or life-threatening deterioration of the following conditions:  Respiratory failure   Critical care was time spent personally by me on the following activities:  Development of treatment plan with patient or surrogate, discussions with consultants, evaluation of patient's response to treatment, examination of patient, obtaining history from patient or surrogate, ordering and performing treatments and interventions, ordering and review of laboratory studies, ordering and review of radiographic studies, pulse oximetry, re-evaluation of patient's condition and review of old charts      Critical Care performed: yes ____________________________________________   INITIAL IMPRESSION / ASSESSMENT AND PLAN / ED COURSE  Pertinent labs & imaging results that were available during my care of the patient were reviewed by me and considered in my medical decision making (see chart for details).   DDX: covid 19, Asthma, copd, CHF, pna, ptx, malignancy, Pe, anemia   ATAVIA POPPE is a 69  y.o. who presents to the ED with evidence of acute respiratory failure with hypoxia.  She is having blood-streaked frothy sputum production but no fevers.  She not complaining of any chest pain.  Is on Eliquis for recent diagnosis of PE.  Patient protecting her airway at this time satting the mid to high 90s on 4 L nasal cannula.  Is improved after nebulizer treatment.  The patient will be placed on continuous pulse oximetry and telemetry for monitoring.  Laboratory evaluation will be sent to evaluate for the above complaints.     Clinical Course as of Feb 18 2329  [facility] Feb 19, 2020  2207 Patient's lactate is elevated. This may be secondary to significant hypoxemia but she does have elevated white count. Given his productive cough for cultures as well as broad-spectrum antibiotics. I will give small bolus of IV fluids but I am concerned for possible pulmonary edema as well therefore will hold off on giving full 30 cc/kg resuscitation as she is currently normotensive. We will continue to reassess.   [PR]  2220 Further assessment patient does appear clinically volume up and did receive quite a bit of IV fluids while in the hospital.  States that she is 5 pounds over her typical weight.  She is not febrile.  I will cover with antibiotics but I am more suspicious this is more likely secondary to CHF and I suspect the lactate is secondary to hypoxia.   [PR]    Clinical Course User Index [PR] Merlyn Lot, MD    The patient was evaluated in Emergency Department today for the symptoms described in the history of present illness. He/she was evaluated in the context of the global COVID-19 pandemic, which necessitated consideration that the patient might be at risk for infection with the SARS-CoV-2 virus that causes COVID-19. Institutional protocols and algorithms that pertain to the evaluation of patients at risk for COVID-19 are in a state of rapid change based on information released by regulatory bodies  including the CDC and federal and state organizations. These policies and algorithms were followed during the patient's care in the ED.  As part of my medical decision making, I reviewed the following data within the [place] notes reviewed and incorporated, Labs reviewed, notes from prior ED visits and [place] Controlled Substance Database   ____________________________________________   FINAL CLINICAL IMPRESSION(S) / ED DIAGNOSES  Final diagnoses:  Acute respiratory  failure with hypoxia (HCC)  Acute on chronic congestive heart failure, unspecified heart failure type ([place])      NEW MEDICATIONS STARTED DURING THIS VISIT:  New Prescriptions   No medications on file     Note:  This document was prepared using Dragon voice recognition software and may include unintentional dictation errors.    Merlyn Lot, MD 02/19/20 2330

## 2020-02-19 NOTE — ED Triage Notes (Signed)
Pt arrives via [facility] from home with complaint of SOB. Pt low 70's% on RA, pt with blue lips. EMS administered 1 duoneb and placed on 8L NRB, spo2 100%.  Pt tachy in the low 100's for HR  Pt a&o x4 on arrival

## 2020-02-19 NOTE — H&P (Addendum)
[place]   PATIENT NAME: Jaime Hall    MR#:  [id number]  DATE OF BIRTH:  01/08/1951  DATE OF ADMISSION:  02/19/2020  PRIMARY CARE PHYSICIAN: [name], MD   REQUESTING/REFERRING PHYSICIAN: Merlyn Lot, MD  CHIEF COMPLAINT:   Chief Complaint  Patient presents with  . Shortness of Breath    HISTORY OF PRESENT ILLNESS:  Jaime Hall  is a 69 y.o. Caucasian female with a known history of CHF, chronic kidney disease, coronary artery disease, GERD and Hodgkin's lymphoma, who presented to the emergency room with acute onset of generalized weakness with worsening dyspnea and associated cough occasionally productive of blood-tinged sputum and two-pillow orthopnea with worsening lower extremity edema.  She was recently admitted here for acute pulmonary embolism for which she was placed on Eliquis.  She was apparently hydrated at that time with IV fluids and her Lasix was held off.  She has gained about 5 pounds recently since her discharge.  She denied any fever or chills.  No nausea or vomiting or abdominal pain.  No dysuria, oliguria or hematuria or flank pain.  She denied chest pain or palpitations.  Upon presentation to the emergency room, blood pressure was 142/85 with a heart rate of 105 and pulse currently 95% on 4 L O2 by nasal cannula.  Respiratory rate was 16 and later 24.  Labs revealed ABG with pH 7.37 and HCO3 of 28.9.  CMP was remarkable for hypokalemia of 3.3 and a BUN of 26 with creatinine 1.43 with total bili of 2.3 and troponin 6.4 with albumin of 3.6.  Urine creatinine are 26 and 1.43 compared to 14/1.09 anion gap was 13.  BNP was 2481.1 and high-sensitivity troponin I was 24.  Lactic acid 3.5 and CBC showed leukocytosis of 13.6 with neutrophilia as well as anemia close to her baseline.  INR was 2 and PT 21.8.  COVID-19 PCR came back negative.  Chest ray showed hazy opacity in both lungs there is change from previous value graft that may be due to  pulmonary edema or multifocal pneumonia.  The patient was given 40 mg of IV Lasix, IV cefepime and vancomycin.  She will be admitted to a progressive unit bed for further evaluation and management. PAST MEDICAL HISTORY:   Past Medical History:  Diagnosis Date  . Anxiety   . Cervicalgia   . CHF (congestive heart failure) ([place])   . Chronic kidney disease   . Coronary artery disease    a. 02/2012 Stress echo: severe anterior wall ischemia;  b. 02/2012 Cath/PCI: LAD 95p (3.0 x 15 Xience EX DES), D1 90ost (PTCA - bifurcational dzs), EF 45% with anterior HK;  b. 02/2013 Ex MV: fixed anterior defect w/ minor reversibility, nl EF-->Med Rx.  . Endometrial cancer ([place])    a. 07/2016 s/p robotic hysterectomy, BSO w/ washings, sentinel node inj, mapping, bx, adhesiolysis.  . Essential hypertension, benign   . Fibrocystic breast disease   . GERD (gastroesophageal reflux disease)   . Gestational hypertension   . Heart murmur   . History of anemia   . History of blood transfusion   . Hodgkin's lymphoma ([place]) 2011   a. s/p radiation and chemo therapy  . Osteoarthritis   . Polycystic ovarian disease     PAST SURGICAL HISTORY:   Past Surgical History:  Procedure Laterality Date  . ABDOMINAL HYSTERECTOMY    . bladder sling    . CARDIAC CATHETERIZATION  02/2012   [facility] 1  stent place  . CERVICAL POLYPECTOMY    . CHOLECYSTECTOMY  1982  . COLONOSCOPY WITH PROPOFOL N/A 02/05/2015   Procedure: COLONOSCOPY WITH PROPOFOL;  Surgeon: Lucilla Lame, MD;  Location: [facility] ENDOSCOPY;  Service: Endoscopy;  Laterality: N/A;  . CORONARY ANGIOPLASTY  02/2012   left/right s/p balloon  . CYSTOGRAM N/A 08/17/2016   Procedure: CYSTOGRAM;  Surgeon: Hollice Espy, MD;  Location: [facility] ORS;  Service: Urology;  Laterality: N/A;  . CYSTOSCOPY N/A 08/17/2016   Procedure: CYSTOSCOPY EXAM UNDER ANESTHESIA;  Surgeon: Hollice Espy, MD;  Location: [facility] ORS;  Service: Urology;  Laterality: N/A;  . CYSTOSCOPY W/ RETROGRADES Bilateral  08/17/2016   Procedure: CYSTOSCOPY WITH RETROGRADE PYELOGRAM;  Surgeon: Hollice Espy, MD;  Location: [facility] ORS;  Service: Urology;  Laterality: Bilateral;  . CYSTOSCOPY WITH STENT PLACEMENT Right 08/17/2016   Procedure: CYSTOSCOPY WITH STENT PLACEMENT;  Surgeon: Hollice Espy, MD;  Location: [facility] ORS;  Service: Urology;  Laterality: Right;  . heart stent'  2013  . kidney stent Right 2018  . LYMPH NODE BIOPSY  2011   diagnosis of hodgkins lymphoma  . PELVIC LYMPH NODE DISSECTION N/A 07/29/2016   Procedure: PELVIC/AORTIC LYMPH NODE SAMPLING;  Surgeon: [name], MD;  Location: [facility] ORS;  Service: Gynecology;  Laterality: N/A;  . PORTA CATH INSERTION N/A 09/22/2016   Procedure: Glori Luis Cath Insertion;  Surgeon: [name], MD;  Location: [place] [facility];  Service: Cardiovascular;  Laterality: N/A;  . PORTA CATH INSERTION N/A 10/27/2019   Procedure: PORTA CATH INSERTION;  Surgeon: [name], MD;  Location: [place] [facility];  Service: Cardiovascular;  Laterality: N/A;  . PORTA CATH REMOVAL N/A 11/17/2016   Procedure: Glori Luis Cath Removal;  Surgeon: [name], MD;  Location: [place] [facility];  Service: Cardiovascular;  Laterality: N/A;  . RIGHT/LEFT HEART CATH AND CORONARY ANGIOGRAPHY N/A 12/04/2019   Procedure: RIGHT/LEFT HEART CATH AND CORONARY ANGIOGRAPHY;  Surgeon: Wellington Hampshire, MD;  Location: [place] [facility];  Service: Cardiovascular;  Laterality: N/A;  . ROBOTIC ASSISTED TOTAL HYSTERECTOMY WITH BILATERAL SALPINGO OOPHERECTOMY N/A 07/29/2016   Procedure: ROBOTIC ASSISTED TOTAL HYSTERECTOMY WITH BILATERAL SALPINGO OOPHORECTOMY;  Surgeon: [name], MD;  Location: [facility];  Service: Gynecology;  Laterality: N/A;  . SENTINEL NODE BIOPSY N/A 07/29/2016   Procedure: SENTINEL NODE BIOPSY;  Surgeon: [name], MD;  Location: [facility];  Service: Gynecology;  Laterality: N/A;  . transobturator sling N/A 2009   [place]     SOCIAL HISTORY:   Social History   Tobacco Use  . Smoking status: Former Smoker    Packs/day: 1.00    Years: 30.00    Pack years: 30.00    Types: Cigarettes    Quit date: 07/24/2002    Years since quitting: 17.5  . Smokeless tobacco: Never Used  . Tobacco comment: quit smoking in 2000  Substance Use Topics  . Alcohol use: Not Currently    FAMILY HISTORY:   Family History  Problem Relation Age of Onset  . ALS Father   . Polymyositis Father   . Diabetes Brother   . Cancer Maternal Aunt        breast  . Breast cancer Maternal Aunt        30's  . Stroke Maternal Grandmother   . Cancer Maternal Grandfather        prostate  . Stroke Maternal Grandfather   . Colon cancer Maternal Aunt   . Non-Hodgkin's lymphoma Cousin  DRUG ALLERGIES:   Allergies  Allergen Reactions  . Adhesive [Tape] Other (See Comments)    Skin Irritation   . Antifungal [Miconazole Nitrate] Rash  . Sulfa Antibiotics Nausea And Vomiting and Rash  . Z-Pak [Azithromycin] Itching    REVIEW OF SYSTEMS:   ROS As per history of present illness. All pertinent systems were reviewed above. Constitutional, HEENT, cardiovascular, respiratory, GI, GU, musculoskeletal, neuro, psychiatric, endocrine, integumentary and hematologic systems were reviewed and are otherwise negative/unremarkable except for positive findings mentioned above in the HPI.   MEDICATIONS AT HOME:   Prior to Admission medications   Medication Sig Start Date End Date Taking? Authorizing Provider  apixaban (ELIQUIS) 5 MG TABS tablet Take 2 tablets (10 mg total) by mouth 2 (two) times daily. 10 mg po BID for 7 days followed by 5 mg po BID 02/16/20   Max Sane, MD  apixaban (ELIQUIS) 5 MG TABS tablet Take 1 tablet (5 mg total) by mouth 2 (two) times daily. 10 mg po BID for 7 days followed by 5 mg po BID 02/22/20   Max Sane, MD  aspirin EC 81 MG tablet Take 81 mg by mouth at bedtime.    [provider]  atorvastatin  (LIPITOR) 10 MG tablet Take 1 tablet by mouth once daily Patient taking differently: Take 10 mg by mouth every evening.  10/23/19   Wellington Hampshire, MD  carvedilol (COREG) 3.125 MG tablet Take 1 tablet (3.125 mg total) by mouth 2 (two) times daily with a meal. 02/05/20   Loel Dubonnet, NP  chlorpheniramine-HYDROcodone (TUSSIONEX PENNKINETIC ER) 10-8 MG/5ML SUER Take 5 mLs by mouth every 12 (twelve) hours as needed for cough. 02/16/20   Max Sane, MD  cholecalciferol (VITAMIN D3) 25 MCG (1000 UNIT) tablet Take 1,000 Units by mouth in the morning and at bedtime.    [provider]  cyanocobalamin (,VITAMIN B-12,) 1000 MCG/ML injection INJECT 1ML IM WEEKLY FOR 4 WEEKS, THEN INJECT MONTHLY THEREAFTER Patient taking differently: Inject 1,000 mcg into the skin every 30 (thirty) days.  09/05/19   [name], MD  DULoxetine (CYMBALTA) 30 MG capsule Take 1 capsule (30 mg total) by mouth daily. 02/01/20   Sindy Guadeloupe, MD  furosemide (LASIX) 20 MG tablet Take 1 tablet (20 mg total) by mouth as needed for fluid or edema (Take one tablet daily as needed for lower extremity edema or for weight gain of 2 lbs overnight or 5 lbs in one week.). Patient taking differently: Take 20 mg by mouth daily.  12/20/19   Loel Dubonnet, NP  guaiFENesin (MUCINEX) 600 MG 12 hr tablet Take 1 tablet (600 mg total) by mouth 2 (two) times daily for 5 days. 02/16/20 02/21/20  Max Sane, MD  lenvatinib 10 mg daily dose (LENVIMA, 10 MG DAILY DOSE,) capsule Take 1 capsule (10 mg total) by mouth daily. Patient taking differently: Take 10 mg by mouth every evening.  02/01/20   Sindy Guadeloupe, MD  levothyroxine (SYNTHROID) 50 MCG tablet Take 1 tablet (50 mcg total) by mouth daily before breakfast. 01/17/20   Cammie Sickle, MD  LORazepam (ATIVAN) 0.5 MG tablet Take 1 tablet (0.5 mg total) by mouth every 6 (six) hours as needed for anxiety (and to help with breathing). 02/01/20   Sindy Guadeloupe, MD  losartan (COZAAR) 25 MG  tablet Take 0.5 tablets (12.5 mg total) by mouth daily. 02/05/20 05/05/20  Loel Dubonnet, NP  nystatin (MYCOSTATIN/NYSTOP) powder Apply 1 application topically  4 (four) times daily. To affect areas for yeast infection of the skin. 02/07/20   Verlon Au, NP  pantoprazole (PROTONIX) 20 MG tablet Take 1 tablet (20 mg total) by mouth daily. 12/20/19   Jacquelin Hawking, NP  predniSONE (STERAPRED UNI-PAK 21 TAB) 10 MG (21) TBPK tablet Start 60 mg po daily, taper 10 mg daily until done 02/16/20   Max Sane, MD  prochlorperazine (COMPAZINE) 10 MG tablet Take 1 tablet (10 mg total) by mouth every 6 (six) hours as needed (Nausea or vomiting). Patient taking differently: Take 10 mg by mouth daily as needed for nausea or vomiting.  10/25/19 01/04/20  Sindy Guadeloupe, MD      VITAL SIGNS:  Blood pressure (!) 142/85, pulse (!) 105, temperature 98 F (36.7 C), temperature source Oral, resp. rate 16, height 5\' 1"  (1.549 m), weight 72 kg, SpO2 95 %.  PHYSICAL EXAMINATION:  Physical Exam  GENERAL:  69 y.o.-year-old Caucasian female patient lying in the bed with mild respiratory distress with conversational dyspnea. EYES: Pupils equal, round, reactive to light and accommodation. No scleral icterus. Extraocular muscles intact.  HEENT: Head atraumatic, normocephalic. Oropharynx and nasopharynx clear.  NECK:  Supple, no jugular venous distention. No thyroid enlargement, no tenderness.  LUNGS: Diminished bibasal breath sounds with bibasal crackles. CARDIOVASCULAR: Regular rate and rhythm, S1, S2 normal. No murmurs, rubs, or gallops.  ABDOMEN: Soft, nondistended, nontender. Bowel sounds present. No organomegaly or mass.  EXTREMITIES: 2+ bilateral lower extremity pitting edema with no cyanosis, or clubbing.  NEUROLOGIC: Cranial nerves II through XII are intact. Muscle strength 5/5 in all extremities. Sensation intact. Gait not checked.  PSYCHIATRIC: The patient is alert and oriented x 3.  Normal affect and good  eye contact. SKIN: No obvious rash, lesion, or ulcer.   LABORATORY PANEL:   CBC Recent Labs  Lab 02/19/20 2125  WBC 15.6*  HGB 9.9*  HCT 30.7*  PLT 138*   ------------------------------------------------------------------------------------------------------------------  Chemistries  Recent Labs  Lab 02/13/20 1659 02/15/20 0306 02/19/20 2125  NA 141   < > 142  K 3.5   < > 3.3*  CL 102   < > 104  CO2 25   < > 25  GLUCOSE 114*   < > 118*  BUN 19   < > 26*  CREATININE 1.27*   < > 1.43*  CALCIUM 8.9   < > 8.7*  MG 1.3*  --   --   AST 27  --  31  ALT 14  --  18  ALKPHOS 67  --  63  BILITOT 2.8*  --  2.3*   < > = values in this interval not displayed.   ------------------------------------------------------------------------------------------------------------------  Cardiac Enzymes No results for input(s): TROPONINI in the last 168 hours. ------------------------------------------------------------------------------------------------------------------  RADIOLOGY:  DG Chest Portable 1 View  Result Date: 02/19/2020 CLINICAL DATA:  Pt arrives via [facility] from home with complaint of SOB. Pt low 70's% on RA, pt with blue lips. EMS administered 1 duoneb and placed on 8L NRB, spo2 100%. Pt tachy in the low 100's for HR EXAM: PORTABLE CHEST 1 VIEW COMPARISON:  02/13/2020 FINDINGS: Hazy opacities are noted in both lungs. Scarring is noted extending from the hila into the medial upper lobes. No convincing pleural effusion and no pneumothorax. Right internal jugular central venous catheter is stable, tip in the lower superior vena cava. Cardiac silhouette is mildly enlarged. No mediastinal or hilar masses. No evidence of adenopathy. Skeletal structures are grossly intact. IMPRESSION:  1. Hazy opacity is noted in lungs, representing a change from the most recent prior chest radiograph. Findings may be due to multifocal pneumonia or pulmonary edema. Electronically Signed   By: Lajean Manes  M.D.   On: 02/19/2020 21:41      IMPRESSION AND PLAN:   1.  Acute on chronic systolic and diastolic CHF with subsequent acute hypoxic respiratory failure.  The patient has associated mild acute kidney injury, likely prerenal secondary to CHF.  Differential diagnosis would include pneumonia that is less likely. -The patient will be admitted to a progressive unit bed. -She will be diuresed with IV Lasix. -We will follow serial troponin I's. -Cardiology consult to be obtained. -I notified Dr. Rockey Situ about the patient.  The patient had a recent 2D echo on 12/29/2019 revealing an EF of 30 to 35% and grade 1 diastolic dysfunction, mild mitral regurgitation and aortic regurgitation. -COVID-19 PCR came back negative. -We will place her for now on IV Levaquin for the possibility of underlying community-acquired pneumonia while monitoring INR.  I do not believe that she acquired infection from recent hospitalization.  Mucolytic therapy will be provided and will follow blood cultures.  She has leukocytosis and tachycardia as well as tachypnea that could be indicating mild associated sepsis.  Lactic acid is 3.5 and this like secondary to her acute CHF.  2.  Recent acute pulmonary embolism.  This could certainly be associated with acute cor pulmonale contributing to #1. -The patient has a therapeutic INR. -We will continue p.o. Eliquis.  3.  Coronary artery disease. -We will continue statin therapy, beta-blocker therapy and aspirin.  4.  Dyslipidemia. -Continue statin therapy.  5.  Hypertension. -We will hold off Cozaar given mild acute kidney injury and continue Coreg.  6.  GERD. -PPI therapy will be resumed.  7.  DVT prophylaxis. -We will continue Eliquis.   All the records are reviewed and case discussed with ED provider. The plan of care was discussed in details with the patient (and family). I answered all questions. The patient agreed to proceed with the above mentioned plan. Further  management will depend upon hospital course.   CODE STATUS: Full code  Status is: Inpatient  Remains inpatient appropriate because:Ongoing diagnostic testing needed not appropriate for outpatient work up, Unsafe d/c plan, IV treatments appropriate due to intensity of illness or inability to take PO and Inpatient level of care appropriate due to severity of illness   Dispo: The patient is from: Home              Anticipated d/c is to: Home              Anticipated d/c date is: 2 days              Patient currently is not medically stable to d/c.    TOTAL TIME TAKING CARE OF THIS PATIENT: 55 minutes.    Christel Mormon M.D on 02/19/2020 at 11:28 PM  [facility]   From 7 PM-7 AM, contact night-coverage [email or web address]  CC: Primary care physician; [name], MD   Note: This dictation was prepared with Dragon dictation along with smaller phrase technology. Any transcriptional typo errors that result from this process are unintentional.

## 2020-02-19 NOTE — ED Notes (Signed)
Pt reports normally taking lasix, but says she was recently in the hospital and says they did not give it to her then. Pt with cancer, recently started new cancer med  Pt reports she was recently diagnosed with PE's

## 2020-02-20 ENCOUNTER — Inpatient Hospital Stay: Payer: PPO

## 2020-02-20 DIAGNOSIS — I509 Heart failure, unspecified: Secondary | ICD-10-CM | POA: Insufficient documentation

## 2020-02-20 DIAGNOSIS — E7800 Pure hypercholesterolemia, unspecified: Secondary | ICD-10-CM

## 2020-02-20 DIAGNOSIS — Z515 Encounter for palliative care: Secondary | ICD-10-CM

## 2020-02-20 DIAGNOSIS — E785 Hyperlipidemia, unspecified: Secondary | ICD-10-CM

## 2020-02-20 DIAGNOSIS — R042 Hemoptysis: Secondary | ICD-10-CM | POA: Diagnosis present

## 2020-02-20 DIAGNOSIS — I5023 Acute on chronic systolic (congestive) heart failure: Secondary | ICD-10-CM

## 2020-02-20 DIAGNOSIS — I2699 Other pulmonary embolism without acute cor pulmonale: Secondary | ICD-10-CM

## 2020-02-20 DIAGNOSIS — R0603 Acute respiratory distress: Secondary | ICD-10-CM

## 2020-02-20 DIAGNOSIS — C541 Malignant neoplasm of endometrium: Secondary | ICD-10-CM

## 2020-02-20 DIAGNOSIS — J9601 Acute respiratory failure with hypoxia: Secondary | ICD-10-CM | POA: Insufficient documentation

## 2020-02-20 DIAGNOSIS — I10 Essential (primary) hypertension: Secondary | ICD-10-CM

## 2020-02-20 DIAGNOSIS — R0600 Dyspnea, unspecified: Secondary | ICD-10-CM

## 2020-02-20 DIAGNOSIS — C786 Secondary malignant neoplasm of retroperitoneum and peritoneum: Secondary | ICD-10-CM

## 2020-02-20 DIAGNOSIS — E039 Hypothyroidism, unspecified: Secondary | ICD-10-CM

## 2020-02-20 LAB — BLOOD CULTURE ID PANEL (REFLEXED) - BCID2

## 2020-02-20 LAB — URINALYSIS, COMPLETE (UACMP) WITH MICROSCOPIC
Bilirubin Urine: NEGATIVE
Glucose, UA: NEGATIVE mg/dL
Hgb urine dipstick: NEGATIVE
Ketones, ur: NEGATIVE mg/dL
Leukocytes,Ua: NEGATIVE
Nitrite: NEGATIVE
Protein, ur: NEGATIVE mg/dL
Specific Gravity, Urine: 1.01 (ref 1.005–1.030)
WBC, UA: NONE SEEN WBC/hpf (ref 0–5)
pH: 6 (ref 5.0–8.0)

## 2020-02-20 LAB — BASIC METABOLIC PANEL
Anion gap: 16 — ABNORMAL HIGH (ref 5–15)
BUN: 25 mg/dL — ABNORMAL HIGH (ref 8–23)
CO2: 25 mmol/L (ref 22–32)
Calcium: 8.4 mg/dL — ABNORMAL LOW (ref 8.9–10.3)
Chloride: 101 mmol/L (ref 98–111)
Creatinine, Ser: 1.26 mg/dL — ABNORMAL HIGH (ref 0.44–1.00)
GFR calc Af Amer: 50 mL/min — ABNORMAL LOW (ref >60)
GFR calc non Af Amer: 43 mL/min — ABNORMAL LOW (ref >60)
Glucose, Bld: 134 mg/dL — ABNORMAL HIGH (ref 70–99)
Potassium: 3.2 mmol/L — ABNORMAL LOW (ref 3.5–5.1)
Sodium: 142 mmol/L (ref 135–145)

## 2020-02-20 LAB — HEMOGLOBIN AND HEMATOCRIT, BLOOD
HCT: 29.4 % — ABNORMAL LOW (ref 36.0–46.0)
Hemoglobin: 9.5 g/dL — ABNORMAL LOW (ref 12.0–15.0)

## 2020-02-20 LAB — CBC WITH DIFFERENTIAL/PLATELET
Abs Immature Granulocytes: 0.12 10*3/uL — ABNORMAL HIGH (ref 0.00–0.07)
Basophils Absolute: 0 10*3/uL (ref 0.0–0.1)
Basophils Relative: 0 %
Eosinophils Absolute: 0 10*3/uL (ref 0.0–0.5)
Eosinophils Relative: 0 %
HCT: 29.9 % — ABNORMAL LOW (ref 36.0–46.0)
Hemoglobin: 9.7 g/dL — ABNORMAL LOW (ref 12.0–15.0)
Immature Granulocytes: 1 %
Lymphocytes Relative: 6 %
Lymphs Abs: 0.8 10*3/uL (ref 0.7–4.0)
MCH: 31.9 pg (ref 26.0–34.0)
MCHC: 32.4 g/dL (ref 30.0–36.0)
MCV: 98.4 fL (ref 80.0–100.0)
Monocytes Absolute: 0.7 10*3/uL (ref 0.1–1.0)
Monocytes Relative: 5 %
Neutro Abs: 11.6 10*3/uL — ABNORMAL HIGH (ref 1.7–7.7)
Neutrophils Relative %: 88 %
Platelets: 127 10*3/uL — ABNORMAL LOW (ref 150–400)
RBC: 3.04 MIL/uL — ABNORMAL LOW (ref 3.87–5.11)
RDW: 17.6 % — ABNORMAL HIGH (ref 11.5–15.5)
WBC: 13.1 10*3/uL — ABNORMAL HIGH (ref 4.0–10.5)
nRBC: 0.5 % — ABNORMAL HIGH (ref 0.0–0.2)

## 2020-02-20 LAB — LACTIC ACID, PLASMA
Lactic Acid, Venous: 2.7 mmol/L (ref 0.5–1.9)
Lactic Acid, Venous: 1.9 mmol/L (ref 0.5–1.9)

## 2020-02-20 LAB — EXPECTORATED SPUTUM ASSESSMENT W GRAM STAIN, RFLX TO RESP C

## 2020-02-20 LAB — TSH: TSH: 20.226 u[IU]/mL — ABNORMAL HIGH (ref 0.350–4.500)

## 2020-02-20 LAB — BRAIN NATRIURETIC PEPTIDE: B Natriuretic Peptide: 3068.3 pg/mL — ABNORMAL HIGH (ref 0.0–100.0)

## 2020-02-20 LAB — PROTIME-INR
INR: 1.9 — ABNORMAL HIGH (ref 0.8–1.2)
Prothrombin Time: 21.3 seconds — ABNORMAL HIGH (ref 11.4–15.2)

## 2020-02-20 LAB — MAGNESIUM: Magnesium: 1.5 mg/dL — ABNORMAL LOW (ref 1.7–2.4)

## 2020-02-20 LAB — TROPONIN I (HIGH SENSITIVITY): Troponin I (High Sensitivity): 30 ng/L — ABNORMAL HIGH (ref <18)

## 2020-02-20 LAB — PROCALCITONIN: Procalcitonin: <0.1 ng/mL

## 2020-02-20 MED ORDER — LEVOTHYROXINE SODIUM 50 MCG PO TABS
62.5000 ug | ORAL_TABLET | Freq: Every day | ORAL | Status: DC
  Administered 2020-02-21 – 2020-02-22 (×2): 62.5 ug via ORAL
  Filled 2020-02-20: qty 1
  Filled 2020-02-20 (×2): qty 0.5

## 2020-02-20 MED ORDER — MORPHINE SULFATE (CONCENTRATE) 10 MG/0.5ML PO SOLN
5.0000 mg | ORAL | Status: DC | PRN
  Administered 2020-02-20: 5 mg via ORAL
  Filled 2020-02-20: qty 0.5

## 2020-02-20 MED ORDER — FUROSEMIDE 10 MG/ML IJ SOLN
40.0000 mg | Freq: Two times a day (BID) | INTRAMUSCULAR | Status: DC
  Administered 2020-02-20 (×2): 40 mg via INTRAVENOUS
  Filled 2020-02-20 (×3): qty 4

## 2020-02-20 MED ORDER — SODIUM CHLORIDE 0.9 % IV SOLN
2.0000 g | INTRAVENOUS | Status: DC
  Administered 2020-02-20: 2 g via INTRAVENOUS
  Filled 2020-02-20: qty 20

## 2020-02-20 MED ORDER — SODIUM CHLORIDE 0.9 % IV SOLN
100.0000 mg | Freq: Two times a day (BID) | INTRAVENOUS | Status: DC
  Administered 2020-02-20 (×2): 100 mg via INTRAVENOUS
  Filled 2020-02-20 (×3): qty 100

## 2020-02-20 MED ORDER — POTASSIUM CHLORIDE 20 MEQ PO PACK
40.0000 meq | PACK | Freq: Two times a day (BID) | ORAL | Status: AC
  Administered 2020-02-20 (×2): 40 meq via ORAL
  Filled 2020-02-20 (×2): qty 2

## 2020-02-20 MED ORDER — VANCOMYCIN HCL 1750 MG/350ML IV SOLN
1750.0000 mg | Freq: Once | INTRAVENOUS | Status: AC
  Administered 2020-02-20: 1750 mg via INTRAVENOUS
  Filled 2020-02-20: qty 350

## 2020-02-20 MED ORDER — FAMOTIDINE 20 MG PO TABS
20.0000 mg | ORAL_TABLET | Freq: Once | ORAL | Status: AC
  Administered 2020-02-20: 20 mg via ORAL
  Filled 2020-02-20: qty 1

## 2020-02-20 MED ORDER — IPRATROPIUM-ALBUTEROL 0.5-2.5 (3) MG/3ML IN SOLN
3.0000 mL | RESPIRATORY_TRACT | Status: DC
  Administered 2020-02-20 – 2020-02-21 (×8): 3 mL via RESPIRATORY_TRACT
  Filled 2020-02-20 (×8): qty 3

## 2020-02-20 MED ORDER — PROTHROMBIN COMPLEX CONC HUMAN 500 UNITS IV KIT
3754.0000 [IU] | PACK | Status: AC
  Administered 2020-02-20: 3754 [IU] via INTRAVENOUS
  Filled 2020-02-20: qty 549

## 2020-02-20 MED ORDER — MAGNESIUM SULFATE 2 GM/50ML IV SOLN
2.0000 g | Freq: Once | INTRAVENOUS | Status: AC
  Administered 2020-02-20: 2 g via INTRAVENOUS
  Filled 2020-02-20: qty 50

## 2020-02-20 MED ORDER — VANCOMYCIN HCL IN DEXTROSE 1-5 GM/200ML-% IV SOLN
1000.0000 mg | INTRAVENOUS | Status: DC
  Filled 2020-02-20: qty 200

## 2020-02-20 MED ORDER — METHYLPREDNISOLONE SODIUM SUCC 40 MG IJ SOLR
40.0000 mg | Freq: Two times a day (BID) | INTRAMUSCULAR | Status: DC
  Administered 2020-02-20 – 2020-02-21 (×4): 40 mg via INTRAVENOUS
  Filled 2020-02-20 (×4): qty 1

## 2020-02-20 NOTE — Consult Note (Addendum)
Pharmacy Brief Note  Pharmacy consulted for Eliquis reversal in a patient "spittin up blood".  Patient takes Eliquis 5 mg BID, per patient she last took Eliquis yesterday prior to coming to the ER (no specific time provided). Therefore, will order KCentra 50 units/kg (used 3754 units x1 dose to use actual vials for compounding).

## 2020-02-20 NOTE — Consult Note (Signed)
Name: AVARIE TAVANO MRN: [id number] DOB: [birth year]     CONSULTATION DATE: 02/19/2020  REFERRING MD :  Eugenie Norrie  CHIEF COMPLAINT:  Shortness of breath and cough  STUDIES:  9/6 SARS COVID-19: Negative  HISTORY OF PRESENT ILLNESS:   Ms. Klutts is a 69 yo woman with a history of CHF, CKD, CAD, GERD and Hodgkin's lymphoma as well as recent admission for acute pulmonary embolism for which she was placed on Eliquis, presenting to the ER for malaise, cough and shortness of breath.   Ms. Bousquet states that after her recent hospitalization for PE she was feeling better after leaving the hospital. Unfortunately, however, on the day of presentation she began developing cough and shortness of breath which prompted her to seek medical care. Upon presentation to the emergency room, she was found to have a SpO2 of 95% on 4L of nasal cannula. She noted coughing blood, but never more than streaks on a tissue. She was given 40mg  of lasix, IV cefepime and vancomycin. Pulmonary was consulted due to hemoptysis.   PAST MEDICAL HISTORY :   has a past medical history of Anxiety, Cervicalgia, CHF (congestive heart failure) ([place]), Chronic kidney disease, Coronary artery disease, Endometrial cancer ([place]), Essential hypertension, benign, Fibrocystic breast disease, GERD (gastroesophageal reflux disease), Gestational hypertension, Heart murmur, History of anemia, History of blood transfusion, Hodgkin's lymphoma ([place]) (2011), Osteoarthritis, and Polycystic ovarian disease.  has a past surgical history that includes bladder sling; Cholecystectomy (1982); transobturator sling (N/A, 2009); heart stent' (2013); Cervical polypectomy; Colonoscopy with propofol (N/A, 02/05/2015); Cardiac catheterization (02/2012); Coronary angioplasty (02/2012); Lymph node biopsy (2011); Abdominal hysterectomy; Robotic assisted total hysterectomy with bilateral salpingo oophorectomy (N/A, 07/29/2016); Sentinel node biopsy (N/A, 07/29/2016);  Pelvic lymph node dissection (N/A, 07/29/2016); Cystoscopy (N/A, 08/17/2016); Cystoscopy w/ retrogrades (Bilateral, 08/17/2016); Cystoscopy with stent placement (Right, 08/17/2016); Cystogram (N/A, 08/17/2016); kidney stent (Right, 2018); PORTA CATH INSERTION (N/A, 09/22/2016); PORTA CATH REMOVAL (N/A, 11/17/2016); PORTA CATH INSERTION (N/A, 10/27/2019); and RIGHT/LEFT HEART CATH AND CORONARY ANGIOGRAPHY (N/A, 12/04/2019). Prior to Admission medications   Medication Sig Start Date End Date Taking? Authorizing Provider  apixaban (ELIQUIS) 5 MG TABS tablet Take 2 tablets (10 mg total) by mouth 2 (two) times daily. 10 mg po BID for 7 days followed by 5 mg po BID 02/16/20  Yes Max Sane, MD  aspirin EC 81 MG tablet Take 81 mg by mouth at bedtime.   Yes [provider]  atorvastatin (LIPITOR) 10 MG tablet Take 1 tablet by mouth once daily Patient taking differently: Take 10 mg by mouth every evening.  10/23/19  Yes Wellington Hampshire, MD  carvedilol (COREG) 3.125 MG tablet Take 1 tablet (3.125 mg total) by mouth 2 (two) times daily with a meal. 02/05/20  Yes Loel Dubonnet, NP  chlorpheniramine-HYDROcodone (TUSSIONEX PENNKINETIC ER) 10-8 MG/5ML SUER Take 5 mLs by mouth every 12 (twelve) hours as needed for cough. 02/16/20  Yes Max Sane, MD  cholecalciferol (VITAMIN D3) 25 MCG (1000 UNIT) tablet Take 1,000 Units by mouth in the morning and at bedtime.   Yes [provider]  cyanocobalamin (,VITAMIN B-12,) 1000 MCG/ML injection INJECT 1ML IM WEEKLY FOR 4 WEEKS, THEN INJECT MONTHLY THEREAFTER Patient taking differently: Inject 1,000 mcg into the skin every 30 (thirty) days.  09/05/19  Yes [name], MD  DULoxetine (CYMBALTA) 30 MG capsule Take 1 capsule (30 mg total) by mouth daily. 02/01/20  Yes Sindy Guadeloupe, MD  furosemide (LASIX) 20 MG tablet Take 1 tablet (  20 mg total) by mouth as needed for fluid or edema (Take one tablet daily as needed for lower extremity edema or for weight gain of 2 lbs  overnight or 5 lbs in one week.). Patient taking differently: Take 20 mg by mouth daily.  12/20/19  Yes Loel Dubonnet, NP  lenvatinib 10 mg daily dose (LENVIMA, 10 MG DAILY DOSE,) capsule Take 1 capsule (10 mg total) by mouth daily. Patient taking differently: Take 10 mg by mouth every evening.  02/01/20  Yes Sindy Guadeloupe, MD  levothyroxine (SYNTHROID) 50 MCG tablet Take 1 tablet (50 mcg total) by mouth daily before breakfast. 01/17/20  Yes Cammie Sickle, MD  LORazepam (ATIVAN) 0.5 MG tablet Take 1 tablet (0.5 mg total) by mouth every 6 (six) hours as needed for anxiety (and to help with breathing). 02/01/20  Yes Sindy Guadeloupe, MD  losartan (COZAAR) 25 MG tablet Take 0.5 tablets (12.5 mg total) by mouth daily. 02/05/20 05/05/20 Yes Loel Dubonnet, NP  pantoprazole (PROTONIX) 20 MG tablet Take 1 tablet (20 mg total) by mouth daily. 12/20/19  Yes Burns, Wandra Feinstein, NP  predniSONE (STERAPRED UNI-PAK 21 TAB) 10 MG (21) TBPK tablet Start 60 mg po daily, taper 10 mg daily until done 02/16/20  Yes Max Sane, MD  guaiFENesin (MUCINEX) 600 MG 12 hr tablet Take 1 tablet (600 mg total) by mouth 2 (two) times daily for 5 days. Patient not taking: Reported on 02/20/2020 02/16/20 02/21/20  Max Sane, MD  prochlorperazine (COMPAZINE) 10 MG tablet Take 1 tablet (10 mg total) by mouth every 6 (six) hours as needed (Nausea or vomiting). Patient taking differently: Take 10 mg by mouth daily as needed for nausea or vomiting.  10/25/19 01/04/20  Sindy Guadeloupe, MD   Allergies  Allergen Reactions  . Adhesive [Tape] Other (See Comments)    Skin Irritation   . Antifungal [Miconazole Nitrate] Rash  . Sulfa Antibiotics Nausea And Vomiting and Rash  . Z-Pak [Azithromycin] Itching    FAMILY HISTORY:  family history includes ALS in her father; Breast cancer in her maternal aunt; Cancer in her maternal aunt and maternal grandfather; Colon cancer in her maternal aunt; Diabetes in her brother; Non-Hodgkin's lymphoma in  her cousin; Polymyositis in her father; Stroke in her maternal grandfather and maternal grandmother. SOCIAL HISTORY:  reports that she quit smoking about 17 years ago. Her smoking use included cigarettes. She has a 30.00 pack-year smoking history. She has never used smokeless tobacco. She reports previous alcohol use. She reports that she does not use drugs.  REVIEW OF SYSTEMS:   Unable to obtain due to critical illness      Estimated body mass index is 29.99 kg/m as calculated from the following:   Height as of this encounter: 5\' 1"  (1.549 m).   Weight as of this encounter: 72 kg.    VITAL SIGNS: Temp:  [98 F (36.7 C)] 98 F (36.7 C) (09/06 2119) Pulse Rate:  [89-109] 90 (09/07 1300) Resp:  [12-29] 24 (09/07 1300) BP: (122-142)/(61-91) 129/77 (09/07 1300) SpO2:  [88 %-95 %] 92 % (09/07 1300) Weight:  [70.8 kg-72 kg] 72 kg (09/06 2301)   I/O last 3 completed shifts: In: 250 [IV Piggyback:250] Out: -  Total I/O In: 97.6 [IV Piggyback:97.6] Out: 700 [Urine:700]   SpO2: 92 % O2 Flow Rate (L/min): 4 L/min   Physical Examination:  GENERAL:Resting in bed, maintains appropriate conversation HEAD: Normocephalic, atraumatic.  EYES: Pupils equal, round, reactive to light.  No scleral icterus.  MOUTH: Moist mucosal membrane. NECK: Supple. No JVD.  PULMONARY: +rhonchi, course breath sounds bilaterally. Moving air well CARDIOVASCULAR: S1 and S2. Regular rate and rhythm. No murmurs, rubs, or gallops.  GASTROINTESTINAL: Soft, nontender, -distended.  Positive bowel sounds.  MUSCULOSKELETAL: No swelling, clubbing, or edema.  NEUROLOGIC: obtunded SKIN:intact,warm,dry  I personally reviewed lab work that was obtained in last 24 hrs. CXR Independently reviewed- Hazy opacities bilaterally  MEDICATIONS: I have reviewed all medications and confirmed regimen as documented   CULTURE RESULTS   Recent Results (from the past 240 hour(s))  SARS Coronavirus 2 by RT PCR (hospital  order, performed in [facility] hospital lab) Nasopharyngeal Nasopharyngeal Swab     Status: None   Collection Time: 02/14/20  4:52 AM   Specimen: Nasopharyngeal Swab  Result Value Ref Range Status   SARS Coronavirus 2 NEGATIVE NEGATIVE Final    Comment: (NOTE) SARS-CoV-2 target nucleic acids are NOT DETECTED.  The SARS-CoV-2 RNA is generally detectable in upper and lower respiratory specimens during the acute phase of infection. The lowest concentration of SARS-CoV-2 viral copies this assay can detect is 250 copies / mL. A negative result does not preclude SARS-CoV-2 infection and should not be used as the sole basis for treatment or other patient management decisions.  A negative result may occur with improper specimen collection / handling, submission of specimen other than nasopharyngeal swab, presence of viral mutation(s) within the areas targeted by this assay, and inadequate number of viral copies (<250 copies / mL). A negative result must be combined with clinical observations, patient history, and epidemiological information.  Fact Sheet for Patients:   StrictlyIdeas.no  Fact Sheet for Healthcare Providers: BankingDealers.co.za  This test is not yet approved or  cleared by the [place] FDA and has been authorized for detection and/or diagnosis of SARS-CoV-2 by FDA under an Emergency Use Authorization (EUA).  This EUA will remain in effect (meaning this test can be used) for the duration of the COVID-19 declaration under Section 564(b)(1) of the Act, 21 U.S.C. section 360bbb-3(b)(1), unless the authorization is terminated or revoked sooner.  Performed at [facility], [address] [place], [place] [address]   SARS Coronavirus 2 by RT PCR (hospital order, performed in [facility] hospital lab) Nasopharyngeal Nasopharyngeal Swab     Status: None   Collection Time: 02/19/20  9:25 PM   Specimen: Nasopharyngeal  Swab  Result Value Ref Range Status   SARS Coronavirus 2 NEGATIVE NEGATIVE Final    Comment: (NOTE) SARS-CoV-2 target nucleic acids are NOT DETECTED.  The SARS-CoV-2 RNA is generally detectable in upper and lower respiratory specimens during the acute phase of infection. The lowest concentration of SARS-CoV-2 viral copies this assay can detect is 250 copies / mL. A negative result does not preclude SARS-CoV-2 infection and should not be used as the sole basis for treatment or other patient management decisions.  A negative result may occur with improper specimen collection / handling, submission of specimen other than nasopharyngeal swab, presence of viral mutation(s) within the areas targeted by this assay, and inadequate number of viral copies (<250 copies / mL). A negative result must be combined with clinical observations, patient history, and epidemiological information.  Fact Sheet for Patients:   StrictlyIdeas.no  Fact Sheet for Healthcare Providers: BankingDealers.co.za  This test is not yet approved or  cleared by the [place] FDA and has been authorized for detection and/or diagnosis of SARS-CoV-2 by FDA under an Emergency Use Authorization (  EUA).  This EUA will remain in effect (meaning this test can be used) for the duration of the COVID-19 declaration under Section 564(b)(1) of the Act, 21 U.S.C. section 360bbb-3(b)(1), unless the authorization is terminated or revoked sooner.  Performed at [facility], [address] [place], [place] [address]   Blood culture (routine single)     Status: None (Preliminary result)   Collection Time: 02/19/20  9:25 PM   Specimen: BLOOD  Result Value Ref Range Status   Specimen Description BLOOD RTAC  Final   Special Requests   Final    BOTTLES DRAWN AEROBIC AND ANAEROBIC Blood Culture adequate volume   Culture   Final    NO GROWTH < 12 HOURS Performed at [facility], [address], [place], [place] [address]    Report Status PENDING  Incomplete  Culture, blood (single)     Status: None (Preliminary result)   Collection Time: 02/20/20 12:25 AM   Specimen: BLOOD  Result Value Ref Range Status   Specimen Description BLOOD RTHAND  Final   Special Requests   Final    BOTTLES DRAWN AEROBIC AND ANAEROBIC Blood Culture adequate volume   Culture   Final    NO GROWTH < 12 HOURS Performed at [facility], [address] [place], [place] [address]    Report Status PENDING  Incomplete  Expectorated sputum assessment w rflx to resp cult     Status: None   Collection Time: 02/20/20  4:11 AM   Specimen: Sputum  Result Value Ref Range Status   Specimen Description SPUTUM  Final   Special Requests NONE  Final   Sputum evaluation   Final    THIS SPECIMEN IS ACCEPTABLE FOR SPUTUM CULTURE Performed at [facility], [address], [place], [place] [address]    Report Status 02/20/2020 FINAL  Final  Culture, respiratory     Status: None (Preliminary result)   Collection Time: 02/20/20  4:11 AM   Specimen: SPU  Result Value Ref Range Status   Specimen Description   Final    SPUTUM Performed at [facility], [address], [place], [place] [address]    Special Requests   Final    NONE Reflexed from [id number] Performed at [facility], [address] [place], [place] [address]    Gram Stain   Final    NO WBC SEEN RARE GRAM POSITIVE COCCI IN PAIRS Performed at [facility], [place] [address], [place], [place] [address]    Culture PENDING  Incomplete   Report Status PENDING  Incomplete          IMAGING    US Venous Img Lower Bilateral (DVT)  Result Date: 02/20/2020 CLINICAL DATA:  PE EXAM: BILATERAL LOWER EXTREMITY VENOUS DOPPLER ULTRASOUND TECHNIQUE: Gray-scale sonography with compression, as well as color and duplex ultrasound, were performed to evaluate the deep venous system(s) from the  level of the common femoral vein through the popliteal and proximal calf veins. COMPARISON:  None. FINDINGS: VENOUS Normal compressibility of the common femoral, superficial femoral, and popliteal veins, as well as the visualized calf veins (peroneal vein not well visualized on the left). Visualized portions of profunda femoral vein and great saphenous vein unremarkable. No filling defects to suggest DVT on grayscale or color Doppler imaging. Doppler waveforms show normal direction of venous flow, normal respiratory plasticity and response to augmentation. OTHER None. Limitations: none IMPRESSION: No evidence of DVT in the lower extremities. Electronically  Signed   By: Macy Mis M.D.   On: 02/20/2020 12:08   DG Chest Portable 1 View  Result Date: 02/19/2020 CLINICAL DATA:  Pt arrives via [facility] from home with complaint of SOB. Pt low 70's% on RA, pt with blue lips. EMS administered 1 duoneb and placed on 8L NRB, spo2 100%. Pt tachy in the low 100's for HR EXAM: PORTABLE CHEST 1 VIEW COMPARISON:  02/13/2020 FINDINGS: Hazy opacities are noted in both lungs. Scarring is noted extending from the hila into the medial upper lobes. No convincing pleural effusion and no pneumothorax. Right internal jugular central venous catheter is stable, tip in the lower superior vena cava. Cardiac silhouette is mildly enlarged. No mediastinal or hilar masses. No evidence of adenopathy. Skeletal structures are grossly intact. IMPRESSION: 1. Hazy opacity is noted in lungs, representing a change from the most recent prior chest radiograph. Findings may be due to multifocal pneumonia or pulmonary edema. Electronically Signed   By: Lajean Manes M.D.   On: 02/19/2020 21:41     Nutrition Status:           Indwelling Urinary Catheter continued, requirement due to   Reason to continue Indwelling Urinary Catheter strict Intake/Output monitoring for hemodynamic instability   Central Line/ continued, requirement due to    Reason to continue [place] of central venous pressure or other hemodynamic parameters and poor IV access   Ventilator continued, requirement due to severe respiratory failure   Ventilator Sedation RASS 0 to -2      ASSESSMENT AND PLAN SYNOPSIS   Hemoptysis in the setting of acute on chronic systolic and diastolic congestive heart failure with subsequent acute hypoxic respiratory failure in the setting of recent pulmonary embolism on anticoagulation -Recommend Kcentra to reverse anticoagulation in the setting of acute hemoptysis -Hold Eliquis until hemoptysis resolved -Continue support with supplemental oxygen -Treat potential underlying etiologies including acute on chronic heart failure and community acquired pneumonia  -Diuresis as tolerated  Jorje Guild, M.D., M.P.H.  Pulmonary & Critical Care Medicine

## 2020-02-20 NOTE — ED Notes (Signed)
Pulmonologist at bedside

## 2020-02-20 NOTE — ED Notes (Signed)
Pt reports oxycodone makes her sleepy, but hydrocodone doesn't. Pt does not like taste of oral morphine. Oxygen trial down to 5L [place] from 6L[place] so pt can hear better over sound of oxygen to laugh with granddaughter and chaplain who are both at bedside at this time.

## 2020-02-20 NOTE — Progress Notes (Signed)
Chaplain provided education related to advanced directives. Jaime Hall is making meaning of accepting that she is declining after making the decision to move to DNR. Jaime Hall has had 11 years since her initial cancer diagnosis and 4 years since her husband died. She is worried about her children, daughter-in-laws, two grandchildren, her cat, and many friends. She is well loved. Family was at bedside and are willing to support Jaime Hall yet need assistance to provide care. Family may benefit from a family meet to discuss future steps. Jaime Hall has verbally shared medical wishes with family and longs for every thing to be fair and evenly divided with her sons.     02/20/20 2000  Clinical Encounter Type  Visited With Patient and family together  Visit Type ED;Psychological support  Spiritual Encounters  Spiritual Needs Grief support;Prayer

## 2020-02-20 NOTE — Progress Notes (Signed)
PHARMACY -  BRIEF ANTIBIOTIC NOTE   Pharmacy has received consult(s) for cefepime and vancomycin from an ED provider.  The patient's profile has been reviewed for ht/wt/allergies/indication/available labs.    One time order(s) placed for vanc 1g, cefepime 2g  Further antibiotics/pharmacy consults should be ordered by admitting physician if indicated.                       Thank you,  Tobie Lords, PharmD, BCPS Clinical Pharmacist 02/20/2020  2:12 AM

## 2020-02-20 NOTE — Consult Note (Signed)
Cardiology Consultation:   Patient ID: Jaime Hall MRN: [id number]; DOB: 19-Feb-1951  Admit date: 02/19/2020 Date of Consult: 02/20/2020  Primary Care Provider: [name], MD Primary Cardiologist: [name], MD  Primary Electrophysiologist:  None    Patient Profile:   Jaime Hall is a 69 y.o. 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 Ellender Hose.  History of Present Illness:   Jaime Hall is a 69 year old female with PMH as above.  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  She is taking the summer off but hopes to return to work in the fall.  She typically notes some lower extremity edema towards the end of her shift, resolving by morning and only occurring in her left leg where lymph nodes were previously reduced.  She is a former smoker.    She has a history of Hodgkin's lymphoma treated with radiation and chemotherapy to the chest area previously in remission since 2011.    She has CAD s/p 02/2012 angioplasty and DES to the pLAD with balloon angioplasty to the diagonal.  Treadmill nuclear stress test 02/2013 showed fixed anterior wall defect with minor reversibility and normal LVEF.  She had uterine cancer 2018 treated with surgery and chemotherapy.    She was diagnosed with stage Ib high-grade serous endometrial cancer with Port-A-Cath placed 10/27/2019.     Echo 11/02/2019 LVEF 30 to 35%, G1DD, mildly elevated PASP, mild to moderate MR/AI.  It was presumed she had chemotherapy-induced heart failure, given she received 8 rounds of Adriamycin during her treatment for Hodgkin's lymphoma 10 years earlier.  Her oncologist changed her  chemotherapy agent after this time and due to newly diagnosed heart failure.     Lexiscan was then performed to rule out ischemic cardiomyopathy and ruled high risk.  R/LHC 12/04/2019 with LVEF 30 to 35%, LVEDP moderately elevated, previous P LAD stent and patent, ostial/proximal LAD 40%.  She was started on Lasix 20 mg daily.  She was seen in clinic 12/13/2019 with ongoing chemotherapy that caused her to feel nauseous and fatigued for a few days.  She felt more lightheaded with position changes since starting Lasix, especially with position changes.  Compression stockings and slow position changes were made. Lasix was reduced to 20mg  QOD.  Via [employer], and due to continued orthostasis, her furosemide was later changed to Lasix PRN for LEE or if gaining 2 pounds overnight or 5 pounds in a week.  She was continued on metoprolol 25 mg twice daily. Shortly thereafter, she noted LEE and took her lasix for three days (7/12-7/15) in a row without real benefit from it and ongoing "stars" in her vision.   She was seen at [facility] for NSTEMI thought likely 2/2 supply demand ischemia back in July 2021. She presented with central and right sided chest pain at rest with SOB, and cough / hemoptysis. She was still seeing stars as above. No presyncope or syncope. No falls. No pnd, orthopnea, weight gain, or early satiety. In the ED, renal function and electrolytes stable. BNP 898.1. HS Tn peaked 1,993. She was anemic with Hgb 8.7 with Hct 27.1. BP soft to hypotensive with SBP 91-121 and DBP 50-60s.  HR 70-90s. EKG showed NSR with progression to LBBB. CTA showed mild ground glass opacity in the lower lungs - pneumonitis from atypical infection or chemotherapy were considerations - and was negative for pulmonary embolism. It was thought her CP was multifactorial in the setting of her PHTN, HFrEF, low CO, anemia, and valvular dz. Echo showed EF 30-35%, G1DD, RVSP 36.8 mmHg.  Diuresis was complicated by orthostasis/BP and  chemotherapy.  It was noted she was not an ideal candidate for cardiac catheterization, given her comorbid conditions as above.  IV heparin was not indicated, given her anemia. She was gently diuresed and discharged home.  When seen at [facility] 8/23, she was noted to have presented to the ED in [facility] after 1 week history of SOB, orthopnea, and vomiting. She was treated with IV lasix.  Echo 01/26/20 showed EF 25-30%, severe global hypokinesis, RV normal size/function, moderate MR, mild AI without aortic stenosis. CT chest with no PE, pulmonary edema with small bilateral pleural effusions, soft tissue thickening bronchiectasis likely scar/post radiation changes. She was taking lasix 30mg  daily. It was noted she had low K 8/19 with sources of dietary K stressed. She was transitioning from chemotherapy to immunotherapy and noticed more n/v. She was still working in childhood nutrition. She reported DOE and fatigue. EKG showed TW changes in II, V5, V6. Ischemic workup deferred. It ws noted that oncology had recommended she see nephrology. Transition to Coreg 3.125mg  and initiation of Losartan 12.5mg  daily.   She was seen again 02/14/20 with fatigue, DOE, weakness, and right sided CP. She had an occasional cough. CXR without active dz. CT showed acute segmental emboli to the right lower lobe and mild ground glass opacity in the right lower lobe. She was admitted and treated with IV heparin with lasix held. She was noted to be supra-therapeutic with infusion held then restarted. She was started on Eliquis 10mg  BID for 1 week followed by 5mg  BID with recommendation for likely indefinite anticoagulation given her malignancy.  Today, she presents to the ED with SOB that started last night 9/6, along with hemoptysis and nausea without emesis. No reported CP. She presented to the ED. Overnight, she felt tachypalpitations in the setting of NSVT at 5AM (~23 beat run) but has not felt recurrence since that time with telemetry  significant for ST. CXR showed hazy opacity progressed from previous images and thought to be 2/2 pna versus edema. Initial labs showed K 3.3, Cr 1.43, BUN 26, Tn 24  30, BNP 2481.1, Hgb 9.9, Hct 30.7, lactic acid 3.5, TSH 20.226, blood cultures pending. EKG showed ST 107bpm, with RAD, TWI in inferior leads II, III, avF, QTc 503, TWI V3-6.    On exam today she denies CP but continues to note SOB and hemoptysis while still on 4L O2.   Heart Pathway Score:     Past Medical History:  Diagnosis Date  . Anxiety   . Cervicalgia   . CHF (congestive heart failure) ([place])   . Chronic kidney disease   . Coronary artery disease    a. 02/2012 Stress echo: severe anterior wall ischemia;  b. 02/2012 Cath/PCI: LAD 95p (3.0 x 15 Xience EX DES), D1 90ost (PTCA - bifurcational dzs), EF 45% with anterior HK;  b. 02/2013 Ex MV: fixed anterior defect w/ minor reversibility, nl EF-->Med Rx.  . Endometrial cancer ([place])    a. 07/2016 s/p robotic hysterectomy, BSO w/ washings, sentinel node inj, mapping, bx, adhesiolysis.  . Essential hypertension, benign   .  Fibrocystic breast disease   . GERD (gastroesophageal reflux disease)   . Gestational hypertension   . Heart murmur   . History of anemia   . History of blood transfusion   . Hodgkin's lymphoma ([place]) 2011   a. s/p radiation and chemo therapy  . Osteoarthritis   . Polycystic ovarian disease     Past Surgical History:  Procedure Laterality Date  . ABDOMINAL HYSTERECTOMY    . bladder sling    . CARDIAC CATHETERIZATION  02/2012   [facility] 1 stent place  . CERVICAL POLYPECTOMY    . CHOLECYSTECTOMY  1982  . COLONOSCOPY WITH PROPOFOL N/A 02/05/2015   Procedure: COLONOSCOPY WITH PROPOFOL;  Surgeon: Lucilla Lame, MD;  Location: [facility] ENDOSCOPY;  Service: Endoscopy;  Laterality: N/A;  . CORONARY ANGIOPLASTY  02/2012   left/right s/p balloon  . CYSTOGRAM N/A 08/17/2016   Procedure: CYSTOGRAM;  Surgeon: Hollice Espy, MD;  Location: [facility] ORS;  Service: Urology;   Laterality: N/A;  . CYSTOSCOPY N/A 08/17/2016   Procedure: CYSTOSCOPY EXAM UNDER ANESTHESIA;  Surgeon: Hollice Espy, MD;  Location: [facility] ORS;  Service: Urology;  Laterality: N/A;  . CYSTOSCOPY W/ RETROGRADES Bilateral 08/17/2016   Procedure: CYSTOSCOPY WITH RETROGRADE PYELOGRAM;  Surgeon: Hollice Espy, MD;  Location: [facility] ORS;  Service: Urology;  Laterality: Bilateral;  . CYSTOSCOPY WITH STENT PLACEMENT Right 08/17/2016   Procedure: CYSTOSCOPY WITH STENT PLACEMENT;  Surgeon: Hollice Espy, MD;  Location: [facility] ORS;  Service: Urology;  Laterality: Right;  . heart stent'  2013  . kidney stent Right 2018  . LYMPH NODE BIOPSY  2011   diagnosis of hodgkins lymphoma  . PELVIC LYMPH NODE DISSECTION N/A 07/29/2016   Procedure: PELVIC/AORTIC LYMPH NODE SAMPLING;  Surgeon: [name], MD;  Location: [facility] ORS;  Service: Gynecology;  Laterality: N/A;  . PORTA CATH INSERTION N/A 09/22/2016   Procedure: Glori Luis Cath Insertion;  Surgeon: [name], MD;  Location: [place] CV LAB;  Service: Cardiovascular;  Laterality: N/A;  . PORTA CATH INSERTION N/A 10/27/2019   Procedure: PORTA CATH INSERTION;  Surgeon: [name], MD;  Location: [place] [facility];  Service: Cardiovascular;  Laterality: N/A;  . PORTA CATH REMOVAL N/A 11/17/2016   Procedure: Glori Luis Cath Removal;  Surgeon: [name], MD;  Location: [place] [facility];  Service: Cardiovascular;  Laterality: N/A;  . RIGHT/LEFT HEART CATH AND CORONARY ANGIOGRAPHY N/A 12/04/2019   Procedure: RIGHT/LEFT HEART CATH AND CORONARY ANGIOGRAPHY;  Surgeon: Wellington Hampshire, MD;  Location: [place] [facility];  Service: Cardiovascular;  Laterality: N/A;  . ROBOTIC ASSISTED TOTAL HYSTERECTOMY WITH BILATERAL SALPINGO OOPHERECTOMY N/A 07/29/2016   Procedure: ROBOTIC ASSISTED TOTAL HYSTERECTOMY WITH BILATERAL SALPINGO OOPHORECTOMY;  Surgeon: [name], MD;  Location: [facility] ORS;  Service: Gynecology;  Laterality: N/A;  .  SENTINEL NODE BIOPSY N/A 07/29/2016   Procedure: SENTINEL NODE BIOPSY;  Surgeon: [name], MD;  Location: [facility] ORS;  Service: Gynecology;  Laterality: N/A;  . transobturator sling N/A 2009   [place]     Home Medications:  Prior to Admission medications   Medication Sig Start Date End Date Taking? Authorizing Provider  aspirin EC 81 MG tablet Take 81 mg by mouth at bedtime.   Yes [provider]  atorvastatin (LIPITOR) 10 MG tablet Take 1 tablet by mouth once daily Patient taking differently: Take 10 mg by mouth every evening.  10/23/19  Yes Wellington Hampshire, MD  cholecalciferol (VITAMIN D3) 25 MCG (1000 UNIT) tablet Take 1,000  Units by mouth in the morning and at bedtime.   Yes [provider]  cyanocobalamin (,VITAMIN B-12,) 1000 MCG/ML injection INJECT 1ML IM WEEKLY FOR 4 WEEKS, THEN INJECT MONTHLY THEREAFTER Patient taking differently: Inject 1,000 mcg into the skin every 30 (thirty) days.  09/05/19  Yes [name], MD  DULoxetine (CYMBALTA) 30 MG capsule Take 1 capsule by mouth once daily Patient taking differently: Take 30 mg by mouth daily.  10/22/19  Yes Sindy Guadeloupe, MD  furosemide (LASIX) 20 MG tablet Take 1 tablet (20 mg total) by mouth as needed for fluid or edema (Take one tablet daily as needed for lower extremity edema or for weight gain of 2 lbs overnight or 5 lbs in one week.). 12/20/19  Yes Loel Dubonnet, NP  lidocaine-prilocaine (EMLA) cream Apply to affected area once Patient taking differently: Apply 1 application topically daily as needed (Apply to affected area during port access).  10/25/19  Yes Sindy Guadeloupe, MD  LORazepam (ATIVAN) 0.5 MG tablet Take 1 tablet (0.5 mg total) by mouth every 6 (six) hours as needed (Nausea or vomiting). Patient taking differently: Take 0.5 mg by mouth daily as needed (Nausea or vomiting).  10/25/19  Yes Sindy Guadeloupe, MD  metoprolol tartrate (LOPRESSOR) 25 MG tablet Take 1 tablet by mouth twice  daily Patient taking differently: Take 25 mg by mouth 2 (two) times daily.  08/04/19  Yes Wellington Hampshire, MD  ondansetron (ZOFRAN) 8 MG tablet Take 1 tablet (8 mg total) by mouth 2 (two) times daily as needed (Nausea or vomiting). Patient taking differently: Take 8 mg by mouth 2 (two) times daily as needed for nausea or vomiting.  10/25/19  Yes Sindy Guadeloupe, MD  oxyCODONE (OXY IR/ROXICODONE) 5 MG immediate release tablet Take 1 tablet (5 mg total) by mouth every 6 (six) hours as needed for severe pain. 12/28/19  Yes Sindy Guadeloupe, MD  pantoprazole (PROTONIX) 20 MG tablet Take 1 tablet (20 mg total) by mouth daily. 12/20/19  Yes Jacquelin Hawking, NP  prochlorperazine (COMPAZINE) 10 MG tablet Take 1 tablet (10 mg total) by mouth every 6 (six) hours as needed (Nausea or vomiting). Patient taking differently: Take 10 mg by mouth daily as needed for nausea or vomiting.  10/25/19  Yes Sindy Guadeloupe, MD    Inpatient Medications: Scheduled Meds: . apixaban  10 mg Oral BID  . [START ON 02/23/2020] apixaban  5 mg Oral BID  . aspirin EC  81 mg Oral QHS  . atorvastatin  10 mg Oral QPM  . carvedilol  3.125 mg Oral BID WC  . cholecalciferol  1,000 Units Oral Daily  . [START ON 02/26/2020] cyanocobalamin  1,000 mcg Subcutaneous Q30 days  . DULoxetine  30 mg Oral Daily  . furosemide  20 mg Oral Daily  . guaiFENesin  600 mg Oral BID  . lenvatinib 10 mg daily dose  10 mg Oral QPM  . levothyroxine  50 mcg Oral QAC breakfast  . losartan  12.5 mg Oral Daily  . nystatin  1 application Topical QID  . pantoprazole  20 mg Oral Daily  . potassium chloride  40 mEq Oral BID  . sodium chloride flush  3 mL Intravenous Q12H   Continuous Infusions: . sodium chloride    . cefTRIAXone (ROCEPHIN)  IV Stopped (02/20/20 0411)  . doxycycline (VIBRAMYCIN) IV Stopped (02/20/20 0631)   PRN Meds:   Allergies:    Allergies  Allergen Reactions  .  Adhesive [Tape] Other (See Comments)    Skin Irritation   . Antifungal  [Miconazole Nitrate] Rash  . Sulfa Antibiotics Nausea And Vomiting and Rash  . Z-Pak [Azithromycin] Itching    Social History:   Social History   Socioeconomic History  . Marital status: Widowed    Spouse name: Not on file  . Number of children: Not on file  . Years of education: Not on file  . Highest education level: Not on file  Occupational History  . Not on file  Tobacco Use  . Smoking status: Former Smoker    Packs/day: 1.00    Years: 30.00    Pack years: 30.00    Types: Cigarettes    Quit date: 07/24/2002    Years since quitting: 17.5  . Smokeless tobacco: Never Used  . Tobacco comment: quit smoking in 2000  Vaping Use  . Vaping Use: Never used  Substance and Sexual Activity  . Alcohol use: Not Currently  . Drug use: No  . Sexual activity: Never  Other Topics Concern  . 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    Social Determinants of Health   Financial Resource Strain:   . Difficulty of Paying Living Expenses: Not on file  Food Insecurity:   . Worried About Charity fundraiser in the Last Year: Not on file  . Ran Out of Food in the Last Year: Not on file  Transportation Needs:   . Lack of Transportation (Medical): Not on file  . Lack of Transportation (Non-Medical): Not on file  Physical Activity:   . Days of Exercise per Week: Not on file  . Minutes of Exercise per Session: Not on file  Stress:   . Feeling of Stress : Not on file  Social Connections:   . Frequency of Communication with Friends and Family: Not on file  . Frequency of Social Gatherings with Friends and Family: Not on file  . Attends Religious Services: Not on file  . Active Member of Clubs or Organizations: Not on file  . Attends Archivist Meetings: Not on file  . Marital Status: Not on file  Intimate Partner Violence:   . Fear of Current or Ex-Partner: Not on file  . Emotionally Abused:  Not on file  . Physically Abused: Not on file  . Sexually Abused: Not on file    Family History:    Family History  Problem Relation Age of Onset  . ALS Father   . Polymyositis Father   . Diabetes Brother   . Cancer Maternal Aunt        breast  . Breast cancer Maternal Aunt        30's  . Stroke Maternal Grandmother   . Cancer Maternal Grandfather        prostate  . Stroke Maternal Grandfather   . Colon cancer Maternal Aunt   . Non-Hodgkin's lymphoma Cousin      ROS:  Please see the history of present illness.  Review of Systems  Constitutional: Positive for malaise/fatigue.  Respiratory: Positive for hemoptysis and shortness of breath. Negative for sputum production.   Cardiovascular: Positive for leg swelling. Negative for chest pain and palpitations.  Gastrointestinal: Positive for nausea. Negative for vomiting.  Musculoskeletal: Negative for falls.  Neurological: Positive for dizziness. Negative for loss of consciousness.  All other systems reviewed and are negative.  All other ROS reviewed and negative.     Physical Exam/Data:   Vitals:   02/20/20 0300 02/20/20 0500 02/20/20 0600 02/20/20 0700  BP: 139/74 131/79 (!) 138/91 124/74  Pulse: (!) 102 89 (!) 104 (!) 102  Resp: (!) 29 (!) 23 (!) 21 19  Temp:      TempSrc:      SpO2: 90% 95% 90%   Weight:      Height:        Intake/Output Summary (Last 24 hours) at 02/20/2020 0756 Last data filed at 02/20/2020 0750 Gross per 24 hour  Intake 250 ml  Output 700 ml  Net -450 ml   Last 3 Weights 02/19/2020 02/19/2020 02/16/2020  Weight (lbs) 158 lb 11.2 oz 156 lb 161 lb 1.6 oz  Weight (kg) 71.986 kg 70.761 kg 73.074 kg     Body mass index is 29.99 kg/m.  General:  Well nourished, well developed, in no acute distress.  HEENT: normal Neck: no JVD Vascular: No carotid bruits; radial pulses 2+ bilaterally Cardiac:  normal S1, S2; tachycardic but regular; 1/6 systolic murmur Lungs: bilateral rhonchi and ralesrales    Abd: soft, nontender, no hepatomegaly  Ext: trace to no bilateral edema Musculoskeletal:  No deformities, BUE and BLE strength normal and equal Skin: warm and dry - RUSB with portacath Neuro:  No focal abnormalities noted Psych:  Normal affect   EKG:  The EKG was personally reviewed and demonstrates:  EKG showed ST 107bpm, with RAD, TWI in inferior leads II, III, avF, QTc 503, TWI V3-6.   Telemetry:  Telemetry was personally reviewed and demonstrates:  NSR, ST, 23 beats NSVT at 5AM (see CV strips).  Relevant CV Studies: 12/29/2019 Echo 1. Left ventricular ejection fraction, by estimation, is 30 to 35%. The  left ventricle has moderately decreased function. The left ventricle  demonstrates global hypokinesis. Left ventricular diastolic parameters are  consistent with Grade I diastolic  dysfunction (impaired relaxation).  2. Right ventricular systolic function is normal. The right ventricular  size is normal. There is mildly elevated pulmonary artery systolic  pressure. The estimated right ventricular systolic pressure is 14.9 mmHg.  3. The mitral valve is normal in structure. Mild mitral valve  regurgitation.  4. The aortic valve is normal in structure. Aortic valve regurgitation is  mild.   12/04/2019 R/LHC  There is moderate to severe left ventricular systolic dysfunction.  LV end diastolic pressure is moderately elevated.  Previously placed Prox LAD stent (unknown type) is widely patent.  Ost LAD to Prox LAD lesion is 40% stenosed. 1. Patent LAD stent with no significant restenosis. There is moderate stenosis in ostial LAD before the stent. No evidence of obstructive disease overall. 2. Moderately to severely reduced LV systolic function with an EF of 30 to 35%. 3. Right heart catheterization showed mildly elevated filling pressures with pulmonary capillary wedge pressure of18 mmHg, mild to moderate pulmonary hypertension at 44/19 with a mean of 33 mmHg, and mildly  reduced cardiac output at 3.89 with a cardiac index of 2.24. Pulmonary vascular resistance is 3.8 Woods units Recommendations: Continue medical therapy for coronary artery disease. 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 She is mildly volume overloaded and I added small dose furosemide. Recommend medical therapy for cardiomyopathy.  11/02/2019 Echo 1. Left ventricular ejection fraction, by estimation, is 30 to 35%. The  left ventricle has moderately decreased function. The left ventricle  demonstrates global hypokinesis. Left ventricular diastolic parameters are  consistent with Grade  I diastolic  dysfunction (impaired relaxation).  2. Right ventricular systolic function is normal. The right ventricular  size is normal. There is mildly elevated pulmonary artery systolic  pressure. The estimated right ventricular systolic pressure is 16.1 mmHg.  3. Mild to moderate mitral valve regurgitation.  4. Aortic valve regurgitation is mild to moderate.     Laboratory Data:  High Sensitivity Troponin:   Recent Labs  Lab 02/14/20 0452 02/14/20 1200 02/19/20 2125 02/20/20 0025  TROPONINIHS 25* 26* 24* 30*     Cardiac EnzymesNo results for input(s): TROPONINI in the last 168 hours. No results for input(s): TROPIPOC in the last 168 hours.  Chemistry Recent Labs  Lab 02/16/20 0439 02/19/20 2125 02/20/20 0411  NA 139 142 142  K 3.3* 3.3* 3.2*  CL 109 104 101  CO2 20* 25 25  GLUCOSE 147* 118* 134*  BUN 14 26* 25*  CREATININE 1.09* 1.43* 1.26*  CALCIUM 8.0* 8.7* 8.4*  GFRNONAA 51* 37* 43*  GFRAA 60* 43* 50*  ANIONGAP 10 13 16*    Recent Labs  Lab 02/13/20 1659 02/19/20 2125  PROT 6.8 6.4*  ALBUMIN 3.8 3.6  AST 27 31  ALT 14 18  ALKPHOS 67 63  BILITOT 2.8* 2.3*   Hematology Recent Labs  Lab 02/16/20 0439 02/19/20 2125 02/20/20 0411  WBC 5.9 15.6* 13.1*  RBC 3.23* 3.13* 3.04*  HGB 10.4* 9.9* 9.7*  HCT 30.8* 30.7* 29.9*   MCV 95.4 98.1 98.4  MCH 32.2 31.6 31.9  MCHC 33.8 32.2 32.4  RDW 17.5* 17.9* 17.6*  PLT 86* 138* 127*   BNP Recent Labs  Lab 02/13/20 1659 02/19/20 2125 02/20/20 0411  BNP 2,108.1* 2,481.1* 3,068.3*    DDimer No results for input(s): DDIMER in the last 168 hours.   Radiology/Studies:  DG Chest Portable 1 View  Result Date: 02/19/2020 CLINICAL DATA:  Pt arrives via [facility] from home with complaint of SOB. Pt low 70's% on RA, pt with blue lips. EMS administered 1 duoneb and placed on 8L NRB, spo2 100%. Pt tachy in the low 100's for HR EXAM: PORTABLE CHEST 1 VIEW COMPARISON:  02/13/2020 FINDINGS: Hazy opacities are noted in both lungs. Scarring is noted extending from the hila into the medial upper lobes. No convincing pleural effusion and no pneumothorax. Right internal jugular central venous catheter is stable, tip in the lower superior vena cava. Cardiac silhouette is mildly enlarged. No mediastinal or hilar masses. No evidence of adenopathy. Skeletal structures are grossly intact. IMPRESSION: 1. Hazy opacity is noted in lungs, representing a change from the most recent prior chest radiograph. Findings may be due to multifocal pneumonia or pulmonary edema. Electronically Signed   By: Lajean Manes M.D.   On: 02/19/2020 21:41    Assessment and Plan:   Elevated high-sensitivity troponin CAD s/p stenting and angioplasty --No CP.  Reports SOB and hemoptysis in the setting of recently diagnosed pulmonary emboli and comorbid conditions as above. Hemoptysis started last night and persisted into this AM.    --HS Tn minimally elevated and flat trending.  --Suspect HS Tn elevated 2/2 supply demand ischemia in the setting of her comorbid conditions, includingcurrent pulmonary embolism, NSVT, AKI,  suspected pna / sepsis, lymphoma with current chemotherapy, anemia pulmonary HTN, low output HFwith known reduced CO,valular dz.   --11/2019 LHC as above. S/p DES to pLAD with R/LHC as above with NICM  thought 2/2 chemotherapy with known Hodgkin's lymphoma and uterine/endometrical chemotherapy. EKG shows TWI in inferior leads - recommend monitor  closely with consideration she is not an ideal cath candidate. Previous echo as above with EF 30-35% and RVSP 36.75mmHg. Per EMR, she also had an echo at [facility] with EF ~25%. Could consider a limited echo to confirm this finding.  --At this time, no indication for further emergent ischemic workup or cardiac cathterization.  --Recommend treatment of underlying pulmonary embolism and infection. IV diuresis as tolerated for volume overload.  Continue anticoagulation with current Eliquis as H&H stable and at baseline.  --Continue rate control with goal HR below 110bpm and titrate as BP and renal function allow.   Acute on chronic HFrEF Pulmonary HTN --Ongoing SOB, hemoptysis.Also volume overloaded on exam after reportedly receiving fluids during her previous admission in the setting of reduced EF and CO.  --Echo with EF stable at 30-35%, G1DD, RVSP 36.79mmHg. As above, previous echo at [facility] shows reduced EF 25% but those records are not available to me. Could consider updating with limited echo to confirm.  --Volume up on exam. --Caution with fluids given low CO. --Continue IV diuresis as BP allows with IV lasix 40mg  q12h. --Monitor I/Os, daily weights. --Daily BMET. Current renal function stable. --Continue current medicationsas BP and renal function allow.  NSVT --Earlier episode of NSVT at [place] currently.  --Consider as 2/2 hypokalemia. Replete with goal 4.0. Mg goal 2.0. --Maintain electrolytes at goal to reduce risk of arrhythmia.  --Continue rate control with Coreg and escalate as BP allows.   Abnormal TSH --Per IM, PCP. Ordered thyroid panel for further workup as this will contribute to elevated rates and arrhythmia.   History of orthostasis Hypertension --Currently room in BP; however, history of soft BP and orthostasis  with diuresis. Continue current Coreg and diuresis and titrate as needed.    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   Anemia --Daily CBC.Current H&H stable. Transfuse below 8.0.   HLD --Continue statin.   For questions or updates, please contact Mark Please consult [email or web address] for contact info under     Signed, Arvil Chaco, PA-C  02/20/2020 7:56 AM

## 2020-02-20 NOTE — Progress Notes (Signed)
Palliative Care Review  Patient identified as having possible palliative care needs. Admitted with multi-focal PNA and acute respiratory failure. 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 She is actively receiving treatment for recurrent metastatic endometrial cancer with new peritoneal nodules but some nodules with treatment response on recent CT-she is managed by Dr. Janese Banks. There is no record of advance care planning documents on file in the EHR. If appropriate consider consultation, will contact oncology palliative NP to support patient.  Lane Hacker, DO Palliative Medicine [phone]

## 2020-02-20 NOTE — ED Notes (Signed)
PT with 23 beat run of vtach. PT mildly symptomatic during episode ( slight trouble catching breath). Stark Klein, NP made aware. No further orders at this time.

## 2020-02-20 NOTE — ED Notes (Signed)
Called RT to place pt on hi-flow [place] d/t pt being on 6 L [place] and staying low 80's. RT stated she would come to ER to place that. Informed admitting MD by secure chat.

## 2020-02-20 NOTE — Progress Notes (Signed)
PROGRESS NOTE    Jaime Hall  QQP:[id number] DOB: February 20, 1951 DOA: 02/19/2020 PCP: [name], MD   Chief Complaint  Patient presents with  . Shortness of Breath    Brief Narrative:  HPI per Dr. Vickey Huger Rothery  is a 69 y.o. Caucasian female with a known history of CHF, chronic kidney disease, coronary artery disease, GERD and Hodgkin's lymphoma, who presented to the emergency room with acute onset of generalized weakness with worsening dyspnea and associated cough occasionally productive of blood-tinged sputum and two-pillow orthopnea with worsening lower extremity edema.  She was recently admitted here for acute pulmonary embolism for which she was placed on Eliquis.  She was apparently hydrated at that time with IV fluids and her Lasix was held off.  She has gained about 5 pounds recently since her discharge.  She denied any fever or chills.  No nausea or vomiting or abdominal pain.  No dysuria, oliguria or hematuria or flank pain.  She denied chest pain or palpitations.  Upon presentation to the emergency room, blood pressure was 142/85 with a heart rate of 105 and pulse currently 95% on 4 L O2 by nasal cannula.  Respiratory rate was 16 and later 24.  Labs revealed ABG with pH 7.37 and HCO3 of 28.9.  CMP was remarkable for hypokalemia of 3.3 and a BUN of 26 with creatinine 1.43 with total bili of 2.3 and troponin 6.4 with albumin of 3.6.  Urine creatinine are 26 and 1.43 compared to 14/1.09 anion gap was 13.  BNP was 2481.1 and high-sensitivity troponin I was 24.  Lactic acid 3.5 and CBC showed leukocytosis of 13.6 with neutrophilia as well as anemia close to her baseline.  INR was 2 and PT 21.8.  COVID-19 PCR came back negative.  Chest ray showed hazy opacity in both lungs there is change from previous value graft that may be due to pulmonary edema or multifocal pneumonia.  The patient was given 40 mg of IV Lasix, IV cefepime and vancomycin.  She will be admitted to a  progressive unit bed for further evaluation and management.  Assessment & Plan:   Principal Problem:   Acute respiratory distress Active Problems:   Hemoptysis   Hyperlipidemia   History of Hodgkin's lymphoma   HTN (hypertension)   HLD (hyperlipidemia)   Hypothyroidism   Acute CHF (congestive heart failure) (HCC)  #1 acute respiratory distress secondary to acute on chronic combined systolic and diastolic heart failure and hemoptysis Patient presented with hemoptysis, lower extremity edema, two-pillow orthopnea, worsening shortness of breath, recently diagnosed with acute PE and placed on Eliquis.  Patient on my examination with significant hemoptysis in the room.  Patient states during recent hospitalization diuretics were held and she was not discharged on them however resumed them herself at home.  Patient with recent 2D echo 12/29/2019 with a EF of 30 to 35% and grade 1 diastolic dysfunction, mild mitral regurgitation, aortic regurgitation.  Patient on Lasix 40 mg IV every 12 hours which we will continue.  Concern for underlying pneumonia and as such patient started on empiric IV antibiotics.  Check a procalcitonin level.  Blood cultures obtained and are pending.  Urine cultures pending.  Sputum Gram stain and culture pending.  Continue Lasix 40 mg IV every 12 hours, Lipitor, Coreg, Cozaar.  Strict I's and O's.  Daily weights.  Cardiology consulted and are following.  See below for hemoptysis.  2.  Hemoptysis Patient with significant hemoptysis on presentation and on evaluation  in the ED.  Patient recently diagnosed with acute PE per CT angiogram of 02/14/2020.  Patient was discharged on Eliquis.  Due to significant hemoptysis and acute respiratory distress will consult with critical care for further evaluation and management.  Discussed with critical care, Dr.Kasa who recommended placing patient on Solu-Medrol 40 mg IV every 12 hours, scheduled duo nebs, reversal of Eliquis via pharmacy consult,  check lower extremity Dopplers for DVT and if positive for DVT will need IVC filter.  Discontinue Eliquis, DC aspirin.  Repeat H&H this afternoon.  Supportive care.  Critical care to assess patient.  3.  Hypertension Continue diuretics, Coreg, Cozaar.  Follow.  4.  Hypothyroidism TSH noted at 20.26.  Patient states that she is compliant with her Synthroid.  Increase Synthroid to 62.5 MCG's daily.  Will need repeat thyroid function studies done in 4 to 6 weeks.  Follow.  5.  Recent acute pulmonary emboli Patient presenting with hemoptysis.  Case discussed with critical care who will assess patient and recommending discontinuation of Eliquis, aspirin.  Also recommended reversal agent for Eliquis which we will order per pharmacy.  Check lower extremity Dopplers and if positive for DVT will likely need evaluation by vascular surgery for IVC filter placement.  Supportive care.  6.  Coronary artery disease Continue statin, beta-blocker.  Aspirin discontinued due to hemoptysis.  Cardiology following.  7.  Hyperlipidemia Statin.  8.  Gastroesophageal reflux disease PPI.  9.  History of recurrent high-grade serous endometrial cancer Outpatient follow-up with oncology.   DVT prophylaxis: SCDs. Code Status: Full Family Communication: Updated patient.  No family at bedside. Disposition:   Status is: Inpatient    Dispo: The patient is from: Home              Anticipated d/c is to: To be determined              Anticipated d/c date is: To be determined              Patient currently in acute respiratory distress secondary to acute CHF exacerbation and also patient with significant hemoptysis.  Not stable for discharge.       Consultants:   Intensivist/critical care pending 02/20/2020  Procedures:   Chest x-ray 02/19/2020  Lower extremity Dopplers pending 02/20/2020  Antimicrobials:   IV Rocephin 02/20/2020>>>>  IV doxycycline 02/20/2020>>>   Subjective: Laying on gurney in the ED  holding the [place] with blood noted in the.  Patient with hemoptysis ongoing.  Patient still with complaints of significant shortness of breath.  Denies any chest pain.  Stated hemoptysis is what prompted her to come to the hospital.  Patient states recently started on anticoagulation for PE from recent hospitalization.  Objective: Vitals:   02/20/20 0800 02/20/20 0900 02/20/20 0901 02/20/20 0930  BP: 126/76 123/61    Pulse: (!) 102 100 96 (!) 103  Resp: (!) 23 12 (!) 25 19  Temp:      TempSrc:      SpO2: 93% (!) 88% 92% 91%  Weight:      Height:        Intake/Output Summary (Last 24 hours) at 02/20/2020 0955 Last data filed at 02/20/2020 0806 Gross per 24 hour  Intake 347.57 ml  Output 700 ml  Net -352.43 ml   Filed Weights   02/19/20 2119 02/19/20 2301  Weight: 70.8 kg 72 kg    Examination:  General exam: Mild use of accessory muscles of respiration.  Hemoptysis. Respiratory system: Diffuse  crackles.  Mild use of accessory muscles of respiration.  No wheezing.  Speaking in full sentences.  Cardiovascular system: S1 & S2 heard, RRR. No JVD, murmurs, rubs, gallops or clicks.  Trace to 1+ bilateral lower extremity edema.  Gastrointestinal system: Abdomen is nondistended, soft and nontender. No organomegaly or masses felt. Normal bowel sounds heard. Central nervous system: Alert and oriented. No focal neurological deficits. Extremities: Symmetric 5 x 5 power. Skin: No rashes, lesions or ulcers Psychiatry: Judgement and insight appear normal. Mood & affect appropriate.     Data Reviewed: I have personally reviewed following labs and imaging studies  CBC: Recent Labs  Lab 02/14/20 1439 02/15/20 0306 02/16/20 0439 02/19/20 2125 02/20/20 0411  WBC 4.4 4.9 5.9 15.6* 13.1*  NEUTROABS  --   --   --  13.6* 11.6*  HGB 11.3* 10.3* 10.4* 9.9* 9.7*  HCT 33.9* 30.9* 30.8* 30.7* 29.9*  MCV 93.9 94.5 95.4 98.1 98.4  PLT 106* 97* 86* 138* 127*    Basic Metabolic Panel: Recent  Labs  Lab 02/13/20 1659 02/15/20 0306 02/16/20 0439 02/19/20 2125 02/20/20 0411  NA 141 140 139 142 142  K 3.5 3.7 3.3* 3.3* 3.2*  CL 102 105 109 104 101  CO2 25 26 20* 25 25  GLUCOSE 114* 100* 147* 118* 134*  BUN 19 17 14  26* 25*  CREATININE 1.27* 1.18* 1.09* 1.43* 1.26*  CALCIUM 8.9 7.9* 8.0* 8.7* 8.4*  MG 1.3*  --   --   --  1.5*    GFR: Estimated Creatinine Clearance: 37.7 mL/min (A) (by C-G formula based on SCr of 1.26 mg/dL (H)).  Liver Function Tests: Recent Labs  Lab 02/13/20 1659 02/19/20 2125  AST 27 31  ALT 14 18  ALKPHOS 67 63  BILITOT 2.8* 2.3*  PROT 6.8 6.4*  ALBUMIN 3.8 3.6    CBG: No results for input(s): GLUCAP in the last 168 hours.   Recent Results (from the past 240 hour(s))  SARS Coronavirus 2 by RT PCR (hospital order, performed in [facility] hospital lab) Nasopharyngeal Nasopharyngeal Swab     Status: None   Collection Time: 02/14/20  4:52 AM   Specimen: Nasopharyngeal Swab  Result Value Ref Range Status   SARS Coronavirus 2 NEGATIVE NEGATIVE Final    Comment: (NOTE) SARS-CoV-2 target nucleic acids are NOT DETECTED.  The SARS-CoV-2 RNA is generally detectable in upper and lower respiratory specimens during the acute phase of infection. The lowest concentration of SARS-CoV-2 viral copies this assay can detect is 250 copies / mL. A negative result does not preclude SARS-CoV-2 infection and should not be used as the sole basis for treatment or other patient management decisions.  A negative result may occur with improper specimen collection / handling, submission of specimen other than nasopharyngeal swab, presence of viral mutation(s) within the areas targeted by this assay, and inadequate number of viral copies (<250 copies / mL). A negative result must be combined with clinical observations, patient history, and epidemiological information.  Fact Sheet for Patients:   StrictlyIdeas.no  Fact Sheet for  Healthcare Providers: BankingDealers.co.za  This test is not yet approved or  cleared by the [place] FDA and has been authorized for detection and/or diagnosis of SARS-CoV-2 by FDA under an Emergency Use Authorization (EUA).  This EUA will remain in effect (meaning this test can be used) for the duration of the COVID-19 declaration under Section 564(b)(1) of the Act, 21 U.S.C. section 360bbb-3(b)(1), unless the authorization is terminated or revoked  sooner.  Performed at [facility], [address] [place], [place] [address]   SARS Coronavirus 2 by RT PCR (hospital order, performed in [facility] hospital lab) Nasopharyngeal Nasopharyngeal Swab     Status: None   Collection Time: 02/19/20  9:25 PM   Specimen: Nasopharyngeal Swab  Result Value Ref Range Status   SARS Coronavirus 2 NEGATIVE NEGATIVE Final    Comment: (NOTE) SARS-CoV-2 target nucleic acids are NOT DETECTED.  The SARS-CoV-2 RNA is generally detectable in upper and lower respiratory specimens during the acute phase of infection. The lowest concentration of SARS-CoV-2 viral copies this assay can detect is 250 copies / mL. A negative result does not preclude SARS-CoV-2 infection and should not be used as the sole basis for treatment or other patient management decisions.  A negative result may occur with improper specimen collection / handling, submission of specimen other than nasopharyngeal swab, presence of viral mutation(s) within the areas targeted by this assay, and inadequate number of viral copies (<250 copies / mL). A negative result must be combined with clinical observations, patient history, and epidemiological information.  Fact Sheet for Patients:   StrictlyIdeas.no  Fact Sheet for Healthcare Providers: BankingDealers.co.za  This test is not yet approved or  cleared by the [place] FDA and has been authorized for  detection and/or diagnosis of SARS-CoV-2 by FDA under an Emergency Use Authorization (EUA).  This EUA will remain in effect (meaning this test can be used) for the duration of the COVID-19 declaration under Section 564(b)(1) of the Act, 21 U.S.C. section 360bbb-3(b)(1), unless the authorization is terminated or revoked sooner.  Performed at [facility], [address] [place], [place] [address]   Expectorated sputum assessment w rflx to resp cult     Status: None   Collection Time: 02/20/20  4:11 AM   Specimen: Sputum  Result Value Ref Range Status   Specimen Description SPUTUM  Final   Special Requests NONE  Final   Sputum evaluation   Final    THIS SPECIMEN IS ACCEPTABLE FOR SPUTUM CULTURE Performed at [facility], [address] [place], [place] [address]    Report Status 02/20/2020 FINAL  Final         Radiology Studies: DG Chest Portable 1 View  Result Date: 02/19/2020 CLINICAL DATA:  Pt arrives via [facility] from home with complaint of SOB. Pt low 70's% on RA, pt with blue lips. EMS administered 1 duoneb and placed on 8L NRB, spo2 100%. Pt tachy in the low 100's for HR EXAM: PORTABLE CHEST 1 VIEW COMPARISON:  02/13/2020 FINDINGS: Hazy opacities are noted in both lungs. Scarring is noted extending from the hila into the medial upper lobes. No convincing pleural effusion and no pneumothorax. Right internal jugular central venous catheter is stable, tip in the lower superior vena cava. Cardiac silhouette is mildly enlarged. No mediastinal or hilar masses. No evidence of adenopathy. Skeletal structures are grossly intact. IMPRESSION: 1. Hazy opacity is noted in lungs, representing a change from the most recent prior chest radiograph. Findings may be due to multifocal pneumonia or pulmonary edema. Electronically Signed   By: Lajean Manes M.D.   On: 02/19/2020 21:41        Scheduled Meds: . apixaban  10 mg Oral BID  . [START ON 02/23/2020] apixaban  5 mg Oral  BID  . aspirin EC  81 mg Oral QHS  . atorvastatin  10 mg Oral QPM  . carvedilol  3.125 mg Oral  BID WC  . cholecalciferol  1,000 Units Oral Daily  . [START ON 02/26/2020] cyanocobalamin  1,000 mcg Subcutaneous Q30 days  . DULoxetine  30 mg Oral Daily  . furosemide  40 mg Intravenous Q12H  . guaiFENesin  600 mg Oral BID  . lenvatinib 10 mg daily dose  10 mg Oral QPM  . levothyroxine  50 mcg Oral QAC breakfast  . losartan  12.5 mg Oral Daily  . nystatin  1 application Topical QID  . pantoprazole  20 mg Oral Daily  . potassium chloride  40 mEq Oral BID  . sodium chloride flush  3 mL Intravenous Q12H   Continuous Infusions: . sodium chloride    . cefTRIAXone (ROCEPHIN)  IV Stopped (02/20/20 0352)  . doxycycline (VIBRAMYCIN) IV Stopped (02/20/20 0631)  . magnesium sulfate bolus IVPB 2 g (02/20/20 0941)     LOS: 1 day    Time spent: 50 minutes    Irine Seal, MD [facility]   To contact the attending provider between 7A-7P or the covering provider during after hours 7P-7A, please log into the web site [email or web address] and access using universal [place] password for that web site. If you do not have the password, please call the hospital operator.  02/20/2020, 9:55 AM

## 2020-02-20 NOTE — Progress Notes (Signed)
CODE SEPSIS - PHARMACY COMMUNICATION  **Broad Spectrum Antibiotics should be administered within 1 hour of Sepsis diagnosis**  Time Code Sepsis Called/Page Received: 2205  Antibiotics Ordered: cefepime/vanc  Time of 1st antibiotic administration: 0020  Additional action taken by pharmacy:   If necessary, Name of Provider/Nurse Contacted:     Tobie Lords ,PharmD Clinical Pharmacist  02/20/2020  2:07 AM

## 2020-02-20 NOTE — ED Notes (Signed)
US at bedside

## 2020-02-20 NOTE — ED Notes (Signed)
Pt still coughing bloody sputum at this time. Producing moderate amounts. MD made aware, meds given

## 2020-02-20 NOTE — Sepsis Progress Note (Signed)
Notified bedside nurse of need to administer antibiotics.  

## 2020-02-20 NOTE — Sepsis Progress Note (Signed)
Notified RN  of need to document reason to not give fluid bolus>>per MD the pt is fluid overload the fluid bolus was cancelled.

## 2020-02-20 NOTE — ED Notes (Signed)
Dr. Janese Banks (oncologist) at bedside.

## 2020-02-20 NOTE — ED Notes (Signed)
Cardiology at bedside.

## 2020-02-20 NOTE — Consult Note (Signed)
Hematology/Oncology Consult note [facility] [phone] [phone]  Patient Care Team: [name], MD as PCP - General (Internal Medicine) Wellington Hampshire, MD as PCP - Cardiology (Cardiology) [name], MD as Consulting Physician (General Surgery) Mellody Drown, MD as Referring Physician (Obstetrics and Gynecology) [name], MD as Referring Physician (Obstetrics and Gynecology) Hollice Espy, MD as Consulting Physician (Urology) [name], RN as Registered Nurse Sindy Guadeloupe, MD as Consulting Physician (Oncology)   Name of the patient: Jaime Hall  [id number]  [birth year]    Reason for consult: Acute hypoxic respiratory failure in the setting of metastatic recurrent endometrial cancer   Requesting physician: Dr. 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 Most recently she has been on Keytruda and lenvatinib her last dose was given on 02/01/2020. She also has baseline congestive heart failure with an EF of 35%. Patient has been admitted to the hospital twice since her last treatment for acute hypoxic respiratory failure. She was recently discharged from the hospital on 02/16/2020. At that time she had a CT chest which showed a acute segmental pulmonary embolus and patient was started on Eliquis. Patient reports after going home she felt better for a day but then her shortness of breath recurred and she had to therefore come back to the hospital. She is currently saturating about 95% on 6 L of oxygen. Repeat chest x-ray showed hazy opacity in her lungs pulmonary edema versus multifocal pneumonia. She was also noted to have mild hemoptysis which is new.  ECOG PS- 3  Pain scale- 0   Review of systems- Review of Systems  Constitutional:  Positive for malaise/fatigue. Negative for chills, fever and weight loss.  HENT: Negative for congestion, ear discharge and nosebleeds.   Eyes: Negative for blurred vision.  Respiratory: Positive for hemoptysis and shortness of breath. Negative for cough, sputum production and wheezing.   Cardiovascular: Negative for chest pain, palpitations, orthopnea and claudication.  Gastrointestinal: Negative for abdominal pain, blood in stool, constipation, diarrhea, heartburn, melena, nausea and vomiting.  Genitourinary: Negative for dysuria, flank pain, frequency, hematuria and urgency.  Musculoskeletal: Negative for back pain, joint pain and myalgias.  Skin: Negative for rash.  Neurological: Negative for dizziness, tingling, focal weakness, seizures, weakness and headaches.  Endo/Heme/Allergies: Does not bruise/bleed easily.  Psychiatric/Behavioral: Negative for depression and suicidal ideas. The patient does not have insomnia.     Allergies  Allergen Reactions  . Adhesive [Tape] Other (See Comments)    Skin Irritation   . Antifungal [Miconazole Nitrate] Rash  . Sulfa Antibiotics Nausea And Vomiting and Rash  . Z-Pak [Azithromycin] Itching    Patient Active Problem List   Diagnosis Date Noted  . Hemoptysis 02/20/2020  . Acute respiratory distress 02/20/2020  . Acute on chronic congestive heart failure ([place])   . Acute respiratory failure with hypoxia ([place])   . Acute CHF (congestive heart failure) ([place]) 02/19/2020  . Acute pulmonary embolism ([place]) 02/14/2020  . Chronic systolic CHF (congestive heart failure) ([place]) 02/14/2020  . Thrombocytopenia ([place]) 02/14/2020  . Hypothyroidism 02/14/2020  . Chest pain 12/29/2019  . Leukocytosis 12/29/2019  . HTN (hypertension) 12/29/2019  . HLD (hyperlipidemia) 12/29/2019  . Elevated troponin 12/29/2019  . CKD (chronic kidney disease), stage IIIa 12/29/2019  . Depression with anxiety 12/29/2019  . Abnormal cardiovascular stress test   .  Dyspnea on  exertion   . Goals of care, counseling/discussion 10/20/2019  . 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 Peritoneal metastases ([place]) 10/16/2019  . Flank pain 10/04/2019  . Right lower quadrant abdominal pain 10/04/2019  . Candidiasis 10/04/2019  . Right anterior knee pain 07/25/2019  . Educated about COVID-19 virus infection 11/15/2018  . Cramps, muscle, general 04/06/2018  . Chronic kidney disease, stage 2, mildly decreased GFR 10/05/2017  . B12 deficiency anemia 07/24/2017  . Normocytic anemia 07/18/2017  . Back pain 07/18/2017  . Chemotherapy-induced peripheral neuropathy ([place]) 12/10/2016  . Hyperlipidemia 08/13/2016  . Hx of colonic polyps 08/13/2016  . Benign neoplasm of sigmoid colon 08/13/2016  . Endometrial cancer ([place]) 08/13/2016  . 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 Lymphedema 07/20/2016  . Chronic venous insufficiency 07/20/2016  . PAD (peripheral artery disease) ([place]) 07/20/2016  . Postmenopausal bleeding 07/14/2016  . Class 1 obesity due to excess calories without serious comorbidity with body mass index (BMI) of 34.0 to 34.9 in adult 05/27/2016  . Leg cramps 05/27/2016  . Bronchiectasis ([place]) 12/19/2015  . Pre-syncope 12/19/2015  . Prolapsed, uterovaginal, incomplete 06/24/2014  . Routine general medical examination at a health care facility 12/30/2012  . Obesity (BMI 30-39.9) 12/30/2012  . Hx of multiple pulmonary nodules 12/28/2012  . Plantar fasciitis of right foot 12/28/2012  . 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 Hip pain, bilateral 09/12/2012  . History of Hodgkin's lymphoma 09/12/2012  . Coronary artery disease   . Chest pain on exertion 02/29/2012     Past Medical History:  Diagnosis Date  . Anxiety   . Cervicalgia   . CHF (congestive heart failure) ([place])   . Chronic kidney disease   . Coronary artery disease    a. 02/2012 Stress echo: severe anterior wall ischemia;  b. 02/2012 Cath/PCI: LAD  95p (3.0 x 15 Xience EX DES), D1 90ost (PTCA - bifurcational dzs), EF 45% with anterior HK;  b. 02/2013 Ex MV: fixed anterior defect w/ minor reversibility, nl EF-->Med Rx.  . Endometrial cancer ([place])    a. 07/2016 s/p robotic hysterectomy, BSO w/ washings, sentinel node inj, mapping, bx, adhesiolysis.  . Essential hypertension, benign   . Fibrocystic breast disease   . GERD (gastroesophageal reflux disease)   . Gestational hypertension   . Heart murmur   . History of anemia   . History of blood transfusion   . Hodgkin's lymphoma ([place]) 2011   a. s/p radiation and chemo therapy  . Osteoarthritis   . Polycystic ovarian disease      Past Surgical History:  Procedure Laterality Date  . ABDOMINAL HYSTERECTOMY    . bladder sling    . CARDIAC CATHETERIZATION  02/2012   [facility] 1 stent place  . CERVICAL POLYPECTOMY    . CHOLECYSTECTOMY  1982  . COLONOSCOPY WITH PROPOFOL N/A 02/05/2015   Procedure: COLONOSCOPY WITH PROPOFOL;  Surgeon: Lucilla Lame, MD;  Location: [facility];  Service: Endoscopy;  Laterality: N/A;  . CORONARY ANGIOPLASTY  02/2012   left/right s/p balloon  . CYSTOGRAM N/A 08/17/2016   Procedure: CYSTOGRAM;  Surgeon: Hollice Espy, MD;  Location: [facility];  Service: Urology;  Laterality: N/A;  . CYSTOSCOPY N/A 08/17/2016   Procedure: CYSTOSCOPY EXAM UNDER ANESTHESIA;  Surgeon: Hollice Espy, MD;  Location: [facility] OR[facility];  Service: Urology;  Laterality: N/A;  . CYSTOSCOPY W/ RETROGRADES Bilateral 08/17/2016   Procedure: CYSTOSCOPY WITH RETROGRADE PYELOGRAM;  Surgeon: Hollice Espy, MD;  Location: [facility];  Service: Urology;  Laterality: Bilateral;  . CYSTOSCOPY WITH STENT PLACEMENT Right 08/17/2016   Procedure: CYSTOSCOPY WITH STENT PLACEMENT;  Surgeon: Hollice Espy, MD;  Location: [facility];  Service: Urology;  Laterality: Right;  . heart stent'  2013  . kidney stent Right 2018  . LYMPH NODE BIOPSY  2011   diagnosis of hodgkins lymphoma  . PELVIC LYMPH NODE DISSECTION N/A 07/29/2016     Procedure: PELVIC/AORTIC LYMPH NODE SAMPLING;  Surgeon: [name], MD;  Location: [facility] OR[facility];  Service: Gynecology;  Laterality: N/A;  . PORTA CATH INSERTION N/A 09/22/2016   Procedure: Glori Luis Cath Insertion;  Surgeon: [name], MD;  Location: [place] [facility];  Service: Cardiovascular;  Laterality: N/A;  . PORTA CATH INSERTION N/A 10/27/2019   Procedure: PORTA CATH INSERTION;  Surgeon: [name], MD;  Location: [place] [facility];  Service: Cardiovascular;  Laterality: N/A;  . PORTA CATH REMOVAL N/A 11/17/2016   Procedure: Glori Luis Cath Removal;  Surgeon: [name], MD;  Location: [place] [facility];  Service: Cardiovascular;  Laterality: N/A;  . RIGHT/LEFT HEART CATH AND CORONARY ANGIOGRAPHY N/A 12/04/2019   Procedure: RIGHT/LEFT HEART CATH AND CORONARY ANGIOGRAPHY;  Surgeon: Wellington Hampshire, MD;  Location: [place] [facility];  Service: Cardiovascular;  Laterality: N/A;  . ROBOTIC ASSISTED TOTAL HYSTERECTOMY WITH BILATERAL SALPINGO OOPHERECTOMY N/A 07/29/2016   Procedure: ROBOTIC ASSISTED TOTAL HYSTERECTOMY WITH BILATERAL SALPINGO OOPHORECTOMY;  Surgeon: [name], MD;  Location: [facility];  Service: Gynecology;  Laterality: N/A;  . SENTINEL NODE BIOPSY N/A 07/29/2016   Procedure: SENTINEL NODE BIOPSY;  Surgeon: [name], MD;  Location: [facility];  Service: Gynecology;  Laterality: N/A;  . transobturator sling N/A 2009   [place]    Social History   Socioeconomic History  . Marital status: Widowed    Spouse name: Not on file  . Number of children: Not on file  . Years of education: Not on file  . Highest education level: Not on file  Occupational History  . Not on file  Tobacco Use  . Smoking status: Former Smoker    Packs/day: 1.00    Years: 30.00    Pack years: 30.00    Types: Cigarettes    Quit date: 07/24/2002    Years since quitting: 17.5  . Smokeless tobacco: Never Used  . Tobacco comment: quit smoking in  2000  Vaping Use  . Vaping Use: Never used  Substance and Sexual Activity  . Alcohol use: Not Currently  . Drug use: No  . Sexual activity: Never  Other Topics Concern  . 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    Social Determinants of Health   Financial Resource Strain:   . Difficulty of Paying Living Expenses: Not on file  Food Insecurity:   . Worried About Charity fundraiser in the Last Year: Not on file  . Ran Out of Food in the Last Year: Not on file  Transportation Needs:   . Lack of Transportation (Medical): Not on file  . Lack of Transportation (Non-Medical): Not on file  Physical Activity:   . Days of Exercise per Week: Not on file  . Minutes of Exercise per Session: Not on file  Stress:   . Feeling of Stress : Not on file  Social Connections:   . Frequency of Communication with Friends and Family:  Not on file  . Frequency of Social Gatherings with Friends and Family: Not on file  . Attends Religious Services: Not on file  . Active Member of Clubs or Organizations: Not on file  . Attends Archivist Meetings: Not on file  . Marital Status: Not on file  Intimate Partner Violence:   . Fear of Current or Ex-Partner: Not on file  . Emotionally Abused: Not on file  . Physically Abused: Not on file  . Sexually Abused: Not on file     Family History  Problem Relation Age of Onset  . ALS Father   . Polymyositis Father   . Diabetes Brother   . Cancer Maternal Aunt        breast  . Breast cancer Maternal Aunt        30's  . Stroke Maternal Grandmother   . Cancer Maternal Grandfather        prostate  . Stroke Maternal Grandfather   . Colon cancer Maternal Aunt   . Non-Hodgkin's lymphoma Cousin      Current Facility-Administered Medications:  .  0.9 %  sodium chloride infusion, 250 mL, Intravenous, PRN, Mansy, Jan A, MD .  acetaminophen (TYLENOL) tablet 650 mg,  650 mg, Oral, Q4H PRN, Mansy, Jan A, MD .  ALPRAZolam Duanne Moron) tablet 0.25 mg, 0.25 mg, Oral, BID PRN, Mansy, Jan A, MD .  atorvastatin (LIPITOR) tablet 10 mg, 10 mg, Oral, QPM, Mansy, Jan A, MD .  carvedilol (COREG) tablet 3.125 mg, 3.125 mg, Oral, BID WC, Mansy, Jan A, MD, 3.125 mg at 02/20/20 0747 .  cefTRIAXone (ROCEPHIN) 2 g in sodium chloride 0.9 % 100 mL IVPB, 2 g, Intravenous, Q24H, Mansy, Arvella Merles, MD, Paused at 02/20/20 0352 .  chlorpheniramine-HYDROcodone (TUSSIONEX) 10-8 MG/5ML suspension 5 mL, 5 mL, Oral, Q12H PRN, Mansy, Jan A, MD, 5 mL at 02/20/20 0203 .  cholecalciferol (VITAMIN D3) tablet 1,000 Units, 1,000 Units, Oral, Daily, Mansy, Arvella Merles, MD, 1,000 Units at 02/20/20 0945 .  [START ON 02/26/2020] cyanocobalamin ((VITAMIN B-12)) injection 1,000 mcg, 1,000 mcg, Subcutaneous, Q30 days, Mansy, Jan A, MD .  doxycycline (VIBRAMYCIN) 100 mg in sodium chloride 0.9 % 250 mL IVPB, 100 mg, Intravenous, Q12H, Mansy, Jan A, MD, Last Rate: 125 mL/hr at 02/20/20 1515, 100 mg at 02/20/20 1515 .  DULoxetine (CYMBALTA) DR capsule 30 mg, 30 mg, Oral, Daily, Mansy, Jan A, MD, 30 mg at 02/20/20 0945 .  furosemide (LASIX) injection 40 mg, 40 mg, Intravenous, Q12H, Eugenie Filler, MD, 40 mg at 02/20/20 0942 .  guaiFENesin (MUCINEX) 12 hr tablet 600 mg, 600 mg, Oral, BID, Mansy, Jan A, MD, 600 mg at 02/20/20 0945 .  ipratropium-albuterol (DUONEB) 0.5-2.5 (3) MG/3ML nebulizer solution 3 mL, 3 mL, Nebulization, Q4H, Eugenie Filler, MD, 3 mL at 02/20/20 1534 .  lenvatinib 10 mg daily dose (LENVIMA) capsule 10 mg, 10 mg, Oral, QPM, Mansy, Jan A, MD .  Derrill Memo ON 02/21/2020] levothyroxine (SYNTHROID) tablet 62.5 mcg, 62.5 mcg, Oral, QAC breakfast, Eugenie Filler, MD .  LORazepam (ATIVAN) tablet 0.5 mg, 0.5 mg, Oral, Q6H PRN, Mansy, Jan A, MD .  losartan (COZAAR) tablet 12.5 mg, 12.5 mg, Oral, Daily, Mansy, Jan A, MD, 12.5 mg at 02/20/20 0945 .  methylPREDNISolone sodium succinate (SOLU-MEDROL) 40 mg/mL  injection 40 mg, 40 mg, Intravenous, Q12H, Eugenie Filler, MD, 40 mg at 02/20/20 1105 .  nystatin (MYCOSTATIN/NYSTOP) topical powder 1 application, 1 application, Topical, QID, Mansy, Arvella Merles, MD .  pantoprazole (PROTONIX) EC tablet 20 mg, 20 mg, Oral, Daily, Mansy, Jan A, MD, 20 mg at 02/20/20 0945 .  potassium chloride (KLOR-CON) packet 40 mEq, 40 mEq, Oral, BID, Lang Snow, NP, 40 mEq at 02/20/20 0631 .  sodium chloride flush (NS) 0.9 % injection 3 mL, 3 mL, Intravenous, Q12H, Mansy, Jan A, MD, 3 mL at 02/20/20 0939 .  sodium chloride flush (NS) 0.9 % injection 3 mL, 3 mL, Intravenous, PRN, Mansy, Arvella Merles, MD  Current Outpatient Medications:  .  apixaban (ELIQUIS) 5 MG TABS tablet, Take 2 tablets (10 mg total) by mouth 2 (two) times daily. 10 mg po BID for 7 days followed by 5 mg po BID, Disp: 60 tablet, Rfl: 0 .  aspirin EC 81 MG tablet, Take 81 mg by mouth at bedtime., Disp: , Rfl:  .  atorvastatin (LIPITOR) 10 MG tablet, Take 1 tablet by mouth once daily (Patient taking differently: Take 10 mg by mouth every evening. ), Disp: 90 tablet, Rfl: 2 .  carvedilol (COREG) 3.125 MG tablet, Take 1 tablet (3.125 mg total) by mouth 2 (two) times daily with a meal., Disp: 60 tablet, Rfl: 2 .  chlorpheniramine-HYDROcodone (TUSSIONEX PENNKINETIC ER) 10-8 MG/5ML SUER, Take 5 mLs by mouth every 12 (twelve) hours as needed for cough., Disp: 140 mL, Rfl: 0 .  cholecalciferol (VITAMIN D3) 25 MCG (1000 UNIT) tablet, Take 1,000 Units by mouth in the morning and at bedtime., Disp: , Rfl:  .  cyanocobalamin (,VITAMIN B-12,) 1000 MCG/ML injection, INJECT 1ML IM WEEKLY FOR 4 WEEKS, THEN INJECT MONTHLY THEREAFTER (Patient taking differently: Inject 1,000 mcg into the skin every 30 (thirty) days. ), Disp: 10 mL, Rfl: 0 .  DULoxetine (CYMBALTA) 30 MG capsule, Take 1 capsule (30 mg total) by mouth daily., Disp: 90 capsule, Rfl: 2 .  furosemide (LASIX) 20 MG tablet, Take 1 tablet (20 mg total) by mouth as  needed for fluid or edema (Take one tablet daily as needed for lower extremity edema or for weight gain of 2 lbs overnight or 5 lbs in one week.). (Patient taking differently: Take 20 mg by mouth daily. ), Disp: 30 tablet, Rfl: 2 .  lenvatinib 10 mg daily dose (LENVIMA, 10 MG DAILY DOSE,) capsule, Take 1 capsule (10 mg total) by mouth daily. (Patient taking differently: Take 10 mg by mouth every evening. ), Disp: 30 capsule, Rfl: 1 .  levothyroxine (SYNTHROID) 50 MCG tablet, Take 1 tablet (50 mcg total) by mouth daily before breakfast., Disp: 30 tablet, Rfl: 3 .  LORazepam (ATIVAN) 0.5 MG tablet, Take 1 tablet (0.5 mg total) by mouth every 6 (six) hours as needed for anxiety (and to help with breathing)., Disp: 120 tablet, Rfl: 0 .  losartan (COZAAR) 25 MG tablet, Take 0.5 tablets (12.5 mg total) by mouth daily., Disp: 15 tablet, Rfl: 2 .  pantoprazole (PROTONIX) 20 MG tablet, Take 1 tablet (20 mg total) by mouth daily., Disp: 60 tablet, Rfl: 1 .  predniSONE (STERAPRED UNI-PAK 21 TAB) 10 MG (21) TBPK tablet, Start 60 mg po daily, taper 10 mg daily until done, Disp: 21 tablet, Rfl: 0 .  guaiFENesin (MUCINEX) 600 MG 12 hr tablet, Take 1 tablet (600 mg total) by mouth 2 (two) times daily for 5 days. (Patient not taking: Reported on 02/20/2020), Disp: 10 tablet, Rfl: 0  Facility-Administered Medications Ordered in Other Encounters:  .  heparin lock flush 100 unit/mL, 500 Units, Intravenous, Once, Sindy Guadeloupe, MD .  sodium chloride flush (  NS) 0.9 % injection 10 mL, 10 mL, Intravenous, PRN, Sindy Guadeloupe, MD, 10 mL at 12/07/19 0914 .  sodium chloride flush (NS) 0.9 % injection 10 mL, 10 mL, Intravenous, PRN, Sindy Guadeloupe, MD, 10 mL at 01/04/20 0850   Physical exam:  Vitals:   02/20/20 1200 02/20/20 1300 02/20/20 1400 02/20/20 1500  BP: 130/81 129/77 116/90 131/76  Pulse: 91 90 100 94  Resp: (!) 23 (!) 24 (!) 23 (!) 24  Temp:      TempSrc:      SpO2: 95% 92% 93% 96%  Weight:      Height:        Physical Exam Constitutional:      Comments: Appears fatigued and short of breath. Unable to complete a sentence.  Cardiovascular:     Rate and Rhythm: Normal rate and regular rhythm.     Heart sounds: Normal heart sounds.  Pulmonary:     Comments: Effort increased. Scattered bilateral rhonchi Abdominal:     General: Bowel sounds are normal.     Palpations: Abdomen is soft.  Skin:    General: Skin is warm and dry.  Neurological:     Mental Status: She is alert and oriented to person, place, and time.        CMP Latest Ref Rng & Units 02/20/2020  Glucose 70 - 99 mg/dL 134(H)  BUN 8 - 23 mg/dL 25(H)  Creatinine 0.44 - 1.00 mg/dL 1.26(H)  Sodium 135 - 145 mmol/L 142  Potassium 3.5 - 5.1 mmol/L 3.2(L)  Chloride 98 - 111 mmol/L 101  CO2 22 - 32 mmol/L 25  Calcium 8.9 - 10.3 mg/dL 8.4(L)  Total Protein 6.5 - 8.1 g/dL -  Total Bilirubin 0.3 - 1.2 mg/dL -  Alkaline Phos 38 - 126 U/L -  AST 15 - 41 U/L -  ALT 0 - 44 U/L -   CBC Latest Ref Rng & Units 02/20/2020  WBC 4.0 - 10.5 K/uL 13.1(H)  Hemoglobin 12.0 - 15.0 g/dL 9.7(L)  Hematocrit 36 - 46 % 29.9(L)  Platelets 150 - 400 K/uL 127(L)    @IMAGES @  DG Chest 2 View  Result Date: 02/13/2020 CLINICAL DATA:  Shortness of breath. On chemotherapy for endometrial cancer. EXAM: CHEST - 2 VIEW COMPARISON:  12/29/2019 FINDINGS: Midline trachea. Normal heart size. Right sided central line terminates at the caval atrial junction. Paramediastinal radiation fibrosis with architectural distortion, similar. No pleural effusion or pneumothorax. Mild right hemidiaphragm elevation. Basilar predominant interstitial thickening, likely related to COPD/chronic bronchitis. No lobar consolidation. Right upper quadrant surgical clips. IMPRESSION: No acute cardiopulmonary disease. Electronically Signed   By: Abigail Miyamoto M.D.   On: 02/13/2020 17:29   CT Angio Chest PE W and/or Wo Contrast  Result Date: 02/14/2020 CLINICAL DATA:  High probability for  pulmonary embolism. Shortness of breath. On chemotherapy for endometrial cancer. EXAM: CT ANGIOGRAPHY CHEST WITH CONTRAST TECHNIQUE: Multidetector CT imaging of the chest was performed using the standard protocol during bolus administration of intravenous contrast. Multiplanar CT image reconstructions and MIPs were obtained to evaluate the vascular anatomy. CONTRAST:  59mL OMNIPAQUE IOHEXOL 350 MG/ML SOLN COMPARISON:  01/16/2020 chest CT FINDINGS: Cardiovascular: Cardiomegaly, most notably left heart dilatation. Atherosclerotic calcification and LAD stenting. Branching, central pulmonary artery filling defects in lateral and posterior segments of the right lower lobe. Porta catheter on the right. Mediastinum/Nodes: No adenopathy Lungs/Pleura: Bilateral perihilar radiation fibrosis. Mild ground-glass density asymmetric to the right base. Upper Abdomen: No acute finding.  Cholecystectomy.  Musculoskeletal: No acute or aggressive finding. Review of the MIP images confirms the above findings. Critical Value/emergent results were called by telephone at the time of interpretation on 02/14/2020 at 6:28 am to provider [facility] , who verbally acknowledged these results. IMPRESSION: 1. Acute segmental emboli to the right lower lobe. 2. Mild ground-glass opacity in the right lower lobe, unlikely to be ischemic given the nonocclusive clot and patchy pattern. Please correlate for infectious symptoms. Electronically Signed   By: Monte Fantasia M.D.   On: 02/14/2020 06:30   US Venous Img Lower Bilateral (DVT)  Result Date: 02/20/2020 CLINICAL DATA:  PE EXAM: BILATERAL LOWER EXTREMITY VENOUS DOPPLER ULTRASOUND TECHNIQUE: Gray-scale sonography with compression, as well as color and duplex ultrasound, were performed to evaluate the deep venous system(s) from the level of the common femoral vein through the popliteal and proximal calf veins. COMPARISON:  None. FINDINGS: VENOUS Normal compressibility of the common femoral,  superficial femoral, and popliteal veins, as well as the visualized calf veins (peroneal vein not well visualized on the left). Visualized portions of profunda femoral vein and great saphenous vein unremarkable. No filling defects to suggest DVT on grayscale or color Doppler imaging. Doppler waveforms show normal direction of venous flow, normal respiratory plasticity and response to augmentation. OTHER None. Limitations: none IMPRESSION: No evidence of DVT in the lower extremities. Electronically Signed   By: Macy Mis M.D.   On: 02/20/2020 12:08   DG Chest Portable 1 View  Result Date: 02/19/2020 CLINICAL DATA:  Pt arrives via [facility] from home with complaint of SOB. Pt low 70's% on RA, pt with blue lips. EMS administered 1 duoneb and placed on 8L NRB, spo2 100%. Pt tachy in the low 100's for HR EXAM: PORTABLE CHEST 1 VIEW COMPARISON:  02/13/2020 FINDINGS: Hazy opacities are noted in both lungs. Scarring is noted extending from the hila into the medial upper lobes. No convincing pleural effusion and no pneumothorax. Right internal jugular central venous catheter is stable, tip in the lower superior vena cava. Cardiac silhouette is mildly enlarged. No mediastinal or hilar masses. No evidence of adenopathy. Skeletal structures are grossly intact. IMPRESSION: 1. Hazy opacity is noted in lungs, representing a change from the most recent prior chest radiograph. Findings may be due to multifocal pneumonia or pulmonary edema. Electronically Signed   By: Lajean Manes M.D.   On: 02/19/2020 21:41    Assessment and plan- Patient is a 69 y.o. female with history of recurrent endometrial serous carcinoma now admitted with acute hypoxic respiratory failure possibly secondary to congestive heart failure  Acute hypoxic respiratory failure: Chest x-ray findings are more suggestive of pulmonary edema than multifocal pneumonia. She also has ongoing hemoptysis which is new. It is unclear as to what is triggering the  hemoptysis- PE versus side effect of Eliquis versus heart failure. Okay to hold Eliquis at this time and assess her respiratory status and ongoing hemoptysis before deciding if trial of heparin drip can be restarted in the setting of acute PE  I had a long discussion with the patient regarding her CODE STATUS and overall prognosis. Most recent scans On 01/16/2020 showed interval response to treatment. However since then patient's performance status is declining mainly due to her underlying shortness of breath. We discussed that her underlying cancer is not curable. Patient understands that her overall prognosis is poor and is agreeable for DNR/DNI. I have also started her on a small dose of morphine 5 mg every 4 hours as needed for  shortness of breath. Patient is also been seen by NP Altha Harm from palliative care. It remains to be seen if her respiratory status can improve to the point that she can be safely discharged. We have discussed possibility of hospice if her condition fails to improve. We will continue those discussions during this hospitalization.     Visit Diagnosis 1. Acute respiratory failure with hypoxia ([place])   2. Acute on chronic congestive heart failure, unspecified heart failure type ([place])   3. Pulmonary embolus (HCC)                                                                                  4. Goals of counseling/discussion  Dr. Randa Evens, MD, MPH [facility] [phone] 02/20/2020 3:58 PM

## 2020-02-20 NOTE — Consult Note (Signed)
[place]  [phone] [phone]   Name: Jaime Hall Date: 02/20/2020 MRN: [id number]  DOB: [birth year]  Patient Care Team: [name], MD as PCP - General (Internal Medicine) Wellington Hampshire, MD as PCP - Cardiology (Cardiology) [name], MD as Consulting Physician (General Surgery) Mellody Drown, MD as Referring Physician (Obstetrics and Gynecology) [name], MD as Referring Physician (Obstetrics and Gynecology) Hollice Espy, MD as Consulting Physician (Urology) [name], RN as Registered Nurse Sindy Guadeloupe, MD as Consulting Physician (Oncology)    REASON FOR CONSULTATION: Jaime Hall is a 69 y.o. 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 Patient was originally diagnosed in 2018 with stage I endometrial cancer and underwent TAH/BSO and adjuvant chemotherapy but unfortunately was found to have disease recurrence in May 2021.  Patient was recently hospitalized 02/14/2020-02/15/2020 with acute hypoxic respiratory failure and found to have acute segmental pulmonary embolus.  She is now readmitted 02/20/2020 with hemoptysis and acute on chronic respiratory failure secondary to CHF exacerbation +/- multifocal PNA.  Palliative care was consulted to help address goals.  SOCIAL HISTORY:     reports that she quit smoking about 17 years ago. Her smoking use included cigarettes. She has a 30.00 pack-year smoking history. She has never used smokeless tobacco. She reports previous alcohol use. She reports that she does not use drugs.  Patient is widowed x3 years.  She lives at home alone with her cat.  She has two sons who live nearby.  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 She still works four hours per day when able.   ADVANCE DIRECTIVES:    Not on file  CODE STATUS: Full code  PAST MEDICAL HISTORY: Past Medical History:  Diagnosis Date  . Anxiety   . Cervicalgia   . CHF (congestive heart failure) ([place])   . Chronic kidney disease   . Coronary artery disease    a. 02/2012 Stress echo: severe anterior wall ischemia;  b. 02/2012 Cath/PCI: LAD 95p (3.0 x 15 Xience EX DES), D1 90ost (PTCA - bifurcational dzs), EF 45% with anterior HK;  b. 02/2013 Ex MV: fixed anterior defect w/ minor reversibility, nl EF-->Med Rx.  . Endometrial cancer ([place])    a. 07/2016 s/p robotic hysterectomy, BSO w/ washings, sentinel node inj, mapping, bx, adhesiolysis.  . Essential hypertension, benign   . Fibrocystic breast disease   . GERD (gastroesophageal reflux disease)   . Gestational hypertension   . Heart murmur   . History of anemia   . History of blood transfusion   . Hodgkin's lymphoma ([place]) 2011   a. s/p radiation and chemo therapy  . Osteoarthritis   . Polycystic ovarian disease     PAST SURGICAL HISTORY:  Past Surgical History:  Procedure Laterality Date  . ABDOMINAL HYSTERECTOMY    . bladder sling    . CARDIAC CATHETERIZATION  02/2012   [facility] 1 stent place  . CERVICAL POLYPECTOMY    . CHOLECYSTECTOMY  1982  . COLONOSCOPY WITH PROPOFOL N/A 02/05/2015   Procedure: COLONOSCOPY WITH PROPOFOL;  Surgeon: Lucilla Lame, MD;  Location: [facility] ENDOSCOPY;  Service: Endoscopy;  Laterality: N/A;  . CORONARY ANGIOPLASTY  02/2012   left/right s/p balloon  . CYSTOGRAM N/A 08/17/2016   Procedure: CYSTOGRAM;  Surgeon: Hollice Espy, MD;  Location: [facility] ORS;  Service: Urology;  Laterality:  N/A;  . CYSTOSCOPY N/A 08/17/2016   Procedure: CYSTOSCOPY EXAM UNDER ANESTHESIA;  Surgeon: Hollice Espy, MD;  Location: [facility] ORS;  Service: Urology;  Laterality: N/A;  . CYSTOSCOPY W/ RETROGRADES Bilateral 08/17/2016   Procedure: CYSTOSCOPY WITH RETROGRADE PYELOGRAM;  Surgeon: Hollice Espy, MD;  Location: [facility] ORS;  Service: Urology;  Laterality: Bilateral;  .  CYSTOSCOPY WITH STENT PLACEMENT Right 08/17/2016   Procedure: CYSTOSCOPY WITH STENT PLACEMENT;  Surgeon: Hollice Espy, MD;  Location: [facility] ORS;  Service: Urology;  Laterality: Right;  . heart stent'  2013  . kidney stent Right 2018  . LYMPH NODE BIOPSY  2011   diagnosis of hodgkins lymphoma  . PELVIC LYMPH NODE DISSECTION N/A 07/29/2016   Procedure: PELVIC/AORTIC LYMPH NODE SAMPLING;  Surgeon: [name], MD;  Location: [facility] ORS;  Service: Gynecology;  Laterality: N/A;  . PORTA CATH INSERTION N/A 09/22/2016   Procedure: Glori Luis Cath Insertion;  Surgeon: [name], MD;  Location: [place] CV LAB;  Service: Cardiovascular;  Laterality: N/A;  . PORTA CATH INSERTION N/A 10/27/2019   Procedure: PORTA CATH INSERTION;  Surgeon: [name], MD;  Location: [place] [facility];  Service: Cardiovascular;  Laterality: N/A;  . PORTA CATH REMOVAL N/A 11/17/2016   Procedure: Glori Luis Cath Removal;  Surgeon: [name], MD;  Location: [place] CV LAB;  Service: Cardiovascular;  Laterality: N/A;  . RIGHT/LEFT HEART CATH AND CORONARY ANGIOGRAPHY N/A 12/04/2019   Procedure: RIGHT/LEFT HEART CATH AND CORONARY ANGIOGRAPHY;  Surgeon: Wellington Hampshire, MD;  Location: [place] CV LAB;  Service: Cardiovascular;  Laterality: N/A;  . ROBOTIC ASSISTED TOTAL HYSTERECTOMY WITH BILATERAL SALPINGO OOPHERECTOMY N/A 07/29/2016   Procedure: ROBOTIC ASSISTED TOTAL HYSTERECTOMY WITH BILATERAL SALPINGO OOPHORECTOMY;  Surgeon: [name], MD;  Location: [facility] ORS;  Service: Gynecology;  Laterality: N/A;  . SENTINEL NODE BIOPSY N/A 07/29/2016   Procedure: SENTINEL NODE BIOPSY;  Surgeon: [name], MD;  Location: [facility] ORS;  Service: Gynecology;  Laterality: N/A;  . transobturator sling N/A 2009   [place]    HEMATOLOGY/ONCOLOGY HISTORY:  Oncology History  Endometrial cancer ([place])  08/13/2016 Initial Diagnosis   Endometrial cancer ([place])   11/06/2019 - 12/28/2019  Chemotherapy   The patient had palonosetron (ALOXI) injection 0.25 mg, 0.25 mg, Intravenous,  Once, 3 of 4 cycles Administration: 0.25 mg (11/14/2019), 0.25 mg (12/07/2019), 0.25 mg (12/14/2019), 0.25 mg (12/28/2019) pegfilgrastim (NEULASTA ONPRO KIT) injection 6 mg, 6 mg, Subcutaneous, Once, 3 of 4 cycles Administration: 6 mg (11/14/2019), 6 mg (12/14/2019) CARBOplatin (PARAPLATIN) 140 mg in sodium chloride 0.9 % 100 mL chemo infusion, 140 mg (100 % of original dose 141 mg), Intravenous,  Once, 3 of 4 cycles Dose modification:   (original dose 141 mg, Cycle 1) Administration: 140 mg (11/06/2019), 110 mg (12/07/2019), 150 mg (11/14/2019), 110 mg (12/14/2019), 110 mg (12/28/2019) gemcitabine (GEMZAR) 1,400 mg in sodium chloride 0.9 % 250 mL chemo infusion, 1,444 mg (100 % of original dose 800 mg/m2), Intravenous,  Once, 3 of 4 cycles Dose modification: 800 mg/m2 (original dose 800 mg/m2, Cycle 1, Reason: Patient Age) Administration: 1,400 mg (11/06/2019), 1,400 mg (11/14/2019), 1,400 mg (12/07/2019), 1,400 mg (12/14/2019), 1,400 mg (12/28/2019)  for chemotherapy treatment.    01/11/2020 -  Chemotherapy   The patient had lenvatinib 14 mg daily dose (LENVIMA, 14 MG DAILY DOSE,) 10 & 4 MG capsule, 14 mg, Oral, Daily, 1 of 1 cycle, Start date: --, End date: -- pembrolizumab (KEYTRUDA) 200 mg in sodium chloride 0.9 %  50 mL chemo infusion, 200 mg, Intravenous, Once, 2 of 6 cycles Administration: 200 mg (01/11/2020), 200 mg (02/01/2020)  for chemotherapy treatment.    Peritoneal metastases ([place])  10/16/2019 Initial Diagnosis   Peritoneal metastases ([place])   11/06/2019 - 12/28/2019 Chemotherapy   The patient had palonosetron (ALOXI) injection 0.25 mg, 0.25 mg, Intravenous,  Once, 3 of 4 cycles Administration: 0.25 mg (11/14/2019), 0.25 mg (12/07/2019), 0.25 mg (12/14/2019), 0.25 mg (12/28/2019) pegfilgrastim (NEULASTA ONPRO KIT) injection 6 mg, 6 mg, Subcutaneous, Once, 3 of 4 cycles Administration: 6 mg (11/14/2019), 6 mg  (12/14/2019) CARBOplatin (PARAPLATIN) 140 mg in sodium chloride 0.9 % 100 mL chemo infusion, 140 mg (100 % of original dose 141 mg), Intravenous,  Once, 3 of 4 cycles Dose modification:   (original dose 141 mg, Cycle 1) Administration: 140 mg (11/06/2019), 110 mg (12/07/2019), 150 mg (11/14/2019), 110 mg (12/14/2019), 110 mg (12/28/2019) gemcitabine (GEMZAR) 1,400 mg in sodium chloride 0.9 % 250 mL chemo infusion, 1,444 mg (100 % of original dose 800 mg/m2), Intravenous,  Once, 3 of 4 cycles Dose modification: 800 mg/m2 (original dose 800 mg/m2, Cycle 1, Reason: Patient Age) Administration: 1,400 mg (11/06/2019), 1,400 mg (11/14/2019), 1,400 mg (12/07/2019), 1,400 mg (12/14/2019), 1,400 mg (12/28/2019)  for chemotherapy treatment.    01/11/2020 -  Chemotherapy   The patient had lenvatinib 14 mg daily dose (LENVIMA, 14 MG DAILY DOSE,) 10 & 4 MG capsule, 14 mg, Oral, Daily, 1 of 1 cycle, Start date: --, End date: -- pembrolizumab (KEYTRUDA) 200 mg in sodium chloride 0.9 % 50 mL chemo infusion, 200 mg, Intravenous, Once, 2 of 6 cycles Administration: 200 mg (01/11/2020), 200 mg (02/01/2020)  for chemotherapy treatment.      ALLERGIES:  is allergic to adhesive [tape], antifungal [miconazole nitrate], sulfa antibiotics, and z-pak [azithromycin].  MEDICATIONS:  Current Facility-Administered Medications  Medication Dose Route Frequency Provider Last Rate Last Admin  . 0.9 %  sodium chloride infusion  250 mL Intravenous PRN Mansy, Jan A, MD      . acetaminophen (TYLENOL) tablet 650 mg  650 mg Oral Q4H PRN Mansy, Jan A, MD      . ALPRAZolam Duanne Moron) tablet 0.25 mg  0.25 mg Oral BID PRN Mansy, Jan A, MD      . atorvastatin (LIPITOR) tablet 10 mg  10 mg Oral QPM Mansy, Jan A, MD      . carvedilol (COREG) tablet 3.125 mg  3.125 mg Oral BID WC Mansy, Jan A, MD   3.125 mg at 02/20/20 0747  . cefTRIAXone (ROCEPHIN) 2 g in sodium chloride 0.9 % 100 mL IVPB  2 g Intravenous Q24H Mansy, Arvella Merles, MD   Paused at 02/20/20 [phone]  .  chlorpheniramine-HYDROcodone (TUSSIONEX) 10-8 MG/5ML suspension 5 mL  5 mL Oral Q12H PRN Mansy, Jan A, MD   5 mL at 02/20/20 0203  . cholecalciferol (VITAMIN D3) tablet 1,000 Units  1,000 Units Oral Daily Mansy, Jan A, MD   1,000 Units at 02/20/20 0945  . [START ON 02/26/2020] cyanocobalamin ((VITAMIN B-12)) injection 1,000 mcg  1,000 mcg Subcutaneous Q30 days Mansy, Jan A, MD      . doxycycline (VIBRAMYCIN) 100 mg in sodium chloride 0.9 % 250 mL IVPB  100 mg Intravenous Q12H Mansy, Jan A, MD 125 mL/hr at 02/20/20 1515 100 mg at 02/20/20 1515  . DULoxetine (CYMBALTA) DR capsule 30 mg  30 mg Oral Daily Mansy, Jan A, MD   30 mg at 02/20/20 0945  . furosemide (LASIX) injection 40  mg  40 mg Intravenous Q12H Eugenie Filler, MD   40 mg at 02/20/20 0942  . guaiFENesin (MUCINEX) 12 hr tablet 600 mg  600 mg Oral BID Mansy, Jan A, MD   600 mg at 02/20/20 0945  . ipratropium-albuterol (DUONEB) 0.5-2.5 (3) MG/3ML nebulizer solution 3 mL  3 mL Nebulization Q4H Eugenie Filler, MD   3 mL at 02/20/20 1534  . lenvatinib 10 mg daily dose (LENVIMA) capsule 10 mg  10 mg Oral QPM Mansy, Jan A, MD      . Derrill Memo ON 02/21/2020] levothyroxine (SYNTHROID) tablet 62.5 mcg  62.5 mcg Oral QAC breakfast Eugenie Filler, MD      . LORazepam (ATIVAN) tablet 0.5 mg  0.5 mg Oral Q6H PRN Mansy, Jan A, MD      . losartan (COZAAR) tablet 12.5 mg  12.5 mg Oral Daily Mansy, Jan A, MD   12.5 mg at 02/20/20 0945  . methylPREDNISolone sodium succinate (SOLU-MEDROL) 40 mg/mL injection 40 mg  40 mg Intravenous Q12H Eugenie Filler, MD   40 mg at 02/20/20 1105  . nystatin (MYCOSTATIN/NYSTOP) topical powder 1 application  1 application Topical QID Mansy, Jan A, MD      . pantoprazole (PROTONIX) EC tablet 20 mg  20 mg Oral Daily Mansy, Jan A, MD   20 mg at 02/20/20 0945  . potassium chloride (KLOR-CON) packet 40 mEq  40 mEq Oral BID Lang Snow, NP   40 mEq at 02/20/20 0631  . sodium chloride flush (NS) 0.9 % injection 3 mL   3 mL Intravenous Q12H Mansy, Jan A, MD   3 mL at 02/20/20 0939  . sodium chloride flush (NS) 0.9 % injection 3 mL  3 mL Intravenous PRN Mansy, Arvella Merles, MD       Current Outpatient Medications  Medication Sig Dispense Refill  . apixaban (ELIQUIS) 5 MG TABS tablet Take 2 tablets (10 mg total) by mouth 2 (two) times daily. 10 mg po BID for 7 days followed by 5 mg po BID 60 tablet 0  . aspirin EC 81 MG tablet Take 81 mg by mouth at bedtime.    Marland Kitchen atorvastatin (LIPITOR) 10 MG tablet Take 1 tablet by mouth once daily (Patient taking differently: Take 10 mg by mouth every evening. ) 90 tablet 2  . carvedilol (COREG) 3.125 MG tablet Take 1 tablet (3.125 mg total) by mouth 2 (two) times daily with a meal. 60 tablet 2  . chlorpheniramine-HYDROcodone (TUSSIONEX PENNKINETIC ER) 10-8 MG/5ML SUER Take 5 mLs by mouth every 12 (twelve) hours as needed for cough. 140 mL 0  . cholecalciferol (VITAMIN D3) 25 MCG (1000 UNIT) tablet Take 1,000 Units by mouth in the morning and at bedtime.    . cyanocobalamin (,VITAMIN B-12,) 1000 MCG/ML injection INJECT 1ML IM WEEKLY FOR 4 WEEKS, THEN INJECT MONTHLY THEREAFTER (Patient taking differently: Inject 1,000 mcg into the skin every 30 (thirty) days. ) 10 mL 0  . DULoxetine (CYMBALTA) 30 MG capsule Take 1 capsule (30 mg total) by mouth daily. 90 capsule 2  . furosemide (LASIX) 20 MG tablet Take 1 tablet (20 mg total) by mouth as needed for fluid or edema (Take one tablet daily as needed for lower extremity edema or for weight gain of 2 lbs overnight or 5 lbs in one week.). (Patient taking differently: Take 20 mg by mouth daily. ) 30 tablet 2  . lenvatinib 10 mg daily dose (LENVIMA, 10 MG DAILY DOSE,) capsule Take 1  capsule (10 mg total) by mouth daily. (Patient taking differently: Take 10 mg by mouth every evening. ) 30 capsule 1  . levothyroxine (SYNTHROID) 50 MCG tablet Take 1 tablet (50 mcg total) by mouth daily before breakfast. 30 tablet 3  . LORazepam (ATIVAN) 0.5 MG tablet  Take 1 tablet (0.5 mg total) by mouth every 6 (six) hours as needed for anxiety (and to help with breathing). 120 tablet 0  . losartan (COZAAR) 25 MG tablet Take 0.5 tablets (12.5 mg total) by mouth daily. 15 tablet 2  . pantoprazole (PROTONIX) 20 MG tablet Take 1 tablet (20 mg total) by mouth daily. 60 tablet 1  . predniSONE (STERAPRED UNI-PAK 21 TAB) 10 MG (21) TBPK tablet Start 60 mg po daily, taper 10 mg daily until done 21 tablet 0  . guaiFENesin (MUCINEX) 600 MG 12 hr tablet Take 1 tablet (600 mg total) by mouth 2 (two) times daily for 5 days. (Patient not taking: Reported on 02/20/2020) 10 tablet 0   Facility-Administered Medications Ordered in Other Encounters  Medication Dose Route Frequency Provider Last Rate Last Admin  . heparin lock flush 100 unit/mL  500 Units Intravenous Once Sindy Guadeloupe, MD      . sodium chloride flush (NS) 0.9 % injection 10 mL  10 mL Intravenous PRN Sindy Guadeloupe, MD   10 mL at 12/07/19 0914  . sodium chloride flush (NS) 0.9 % injection 10 mL  10 mL Intravenous PRN Sindy Guadeloupe, MD   10 mL at 01/04/20 0850    VITAL SIGNS: BP 131/76   Pulse 94   Temp 98 F (36.7 C) (Oral)   Resp (!) 24   Ht '5\' 1"'  (1.549 m)   Wt 158 lb 11.2 oz (72 kg)   SpO2 96%   BMI 29.99 kg/m  [facility] Weights   02/19/20 2119 02/19/20 2301  Weight: 156 lb (70.8 kg) 158 lb 11.2 oz (72 kg)    Estimated body mass index is 29.99 kg/m as calculated from the following:   Height as of this encounter: '5\' 1"'  (1.549 m).   Weight as of this encounter: 158 lb 11.2 oz (72 kg).  LABS: CBC:    Component Value Date/Time   WBC 13.1 (H) 02/20/2020 0411   HGB 9.7 (L) 02/20/2020 0411   HGB 13.4 03/27/2014 1214   HCT 29.9 (L) 02/20/2020 0411   HCT 41.8 03/27/2014 1214   PLT 127 (L) 02/20/2020 0411   PLT 220 03/27/2014 1214   MCV 98.4 02/20/2020 0411   MCV 92 03/27/2014 1214   NEUTROABS 11.6 (H) 02/20/2020 0411   NEUTROABS 3,274 02/15/2019 0000   NEUTROABS 4.9 03/27/2014 1214    LYMPHSABS 0.8 02/20/2020 0411   LYMPHSABS 1.4 03/27/2014 1214   MONOABS 0.7 02/20/2020 0411   MONOABS 0.6 03/27/2014 1214   EOSABS 0.0 02/20/2020 0411   EOSABS 0.1 03/27/2014 1214   BASOSABS 0.0 02/20/2020 0411   BASOSABS 0.1 03/27/2014 1214   Comprehensive Metabolic Panel:    Component Value Date/Time   NA 142 02/20/2020 0411   NA 140 02/15/2019 0000   NA 142 03/27/2014 1214   K 3.2 (L) 02/20/2020 0411   K 4.6 03/27/2014 1214   CL 101 02/20/2020 0411   CL 107 03/27/2014 1214   CO2 25 02/20/2020 0411   CO2 33 (H) 03/27/2014 1214   BUN 25 (H) 02/20/2020 0411   BUN 19 02/15/2019 0000   BUN 14 03/27/2014 1214   CREATININE 1.26 (H) 02/20/2020 0411  CREATININE 1.04 03/27/2014 1214   GLUCOSE 134 (H) 02/20/2020 0411   GLUCOSE 77 03/27/2014 1214   CALCIUM 8.4 (L) 02/20/2020 0411   CALCIUM 8.7 03/27/2014 1214   AST 31 02/19/2020 2125   AST 17 03/27/2014 1214   ALT 18 02/19/2020 2125   ALT 22 03/27/2014 1214   ALKPHOS 63 02/19/2020 2125   ALKPHOS 81 03/27/2014 1214   BILITOT 2.3 (H) 02/19/2020 2125   BILITOT 1.1 (H) 03/27/2014 1214   PROT 6.4 (L) 02/19/2020 2125   PROT 6.9 03/27/2014 1214   ALBUMIN 3.6 02/19/2020 2125   ALBUMIN 3.6 03/27/2014 1214    RADIOGRAPHIC STUDIES: DG Chest 2 View  Result Date: 02/13/2020 CLINICAL DATA:  Shortness of breath. On chemotherapy for endometrial cancer. EXAM: CHEST - 2 VIEW COMPARISON:  12/29/2019 FINDINGS: Midline trachea. Normal heart size. Right sided central line terminates at the caval atrial junction. Paramediastinal radiation fibrosis with architectural distortion, similar. No pleural effusion or pneumothorax. Mild right hemidiaphragm elevation. Basilar predominant interstitial thickening, likely related to COPD/chronic bronchitis. No lobar consolidation. Right upper quadrant surgical clips. IMPRESSION: No acute cardiopulmonary disease. Electronically Signed   By: Abigail Miyamoto M.D.   On: 02/13/2020 17:29   CT Angio Chest PE W and/or Wo  Contrast  Result Date: 02/14/2020 CLINICAL DATA:  High probability for pulmonary embolism. Shortness of breath. On chemotherapy for endometrial cancer. EXAM: CT ANGIOGRAPHY CHEST WITH CONTRAST TECHNIQUE: Multidetector CT imaging of the chest was performed using the standard protocol during bolus administration of intravenous contrast. Multiplanar CT image reconstructions and MIPs were obtained to evaluate the vascular anatomy. CONTRAST:  72m OMNIPAQUE IOHEXOL 350 MG/ML SOLN COMPARISON:  01/16/2020 chest CT FINDINGS: Cardiovascular: Cardiomegaly, most notably left heart dilatation. Atherosclerotic calcification and LAD stenting. Branching, central pulmonary artery filling defects in lateral and posterior segments of the right lower lobe. Porta catheter on the right. Mediastinum/Nodes: No adenopathy Lungs/Pleura: Bilateral perihilar radiation fibrosis. Mild ground-glass density asymmetric to the right base. Upper Abdomen: No acute finding.  Cholecystectomy. Musculoskeletal: No acute or aggressive finding. Review of the MIP images confirms the above findings. Critical Value/emergent results were called by telephone at the time of interpretation on 02/14/2020 at 6:28 am to [facility], who verbally acknowledged these results. IMPRESSION: 1. Acute segmental emboli to the right lower lobe. 2. Mild ground-glass opacity in the right lower lobe, unlikely to be ischemic given the nonocclusive clot and patchy pattern. Please correlate for infectious symptoms. Electronically Signed   By: JMonte FantasiaM.D.   On: 02/14/2020 06:30   UKoreaVenous Img Lower Bilateral (DVT)  Result Date: 02/20/2020 CLINICAL DATA:  PE EXAM: BILATERAL LOWER EXTREMITY VENOUS DOPPLER ULTRASOUND TECHNIQUE: Gray-scale sonography with compression, as well as color and duplex ultrasound, were performed to evaluate the deep venous system(s) from the level of the common femoral vein through the popliteal and proximal calf veins. COMPARISON:   None. FINDINGS: VENOUS Normal compressibility of the common femoral, superficial femoral, and popliteal veins, as well as the visualized calf veins (peroneal vein not well visualized on the left). Visualized portions of profunda femoral vein and great saphenous vein unremarkable. No filling defects to suggest DVT on grayscale or color Doppler imaging. Doppler waveforms show normal direction of venous flow, normal respiratory plasticity and response to augmentation. OTHER None. Limitations: none IMPRESSION: No evidence of DVT in the lower extremities. Electronically Signed   By: PMacy MisM.D.   On: 02/20/2020 12:08   DG Chest Portable 1 View  Result Date:  02/19/2020 CLINICAL DATA:  Pt arrives via [facility] from home with complaint of SOB. Pt low 70's% on RA, pt with blue lips. EMS administered 1 duoneb and placed on 8L NRB, spo2 100%. Pt tachy in the low 100's for HR EXAM: PORTABLE CHEST 1 VIEW COMPARISON:  02/13/2020 FINDINGS: Hazy opacities are noted in both lungs. Scarring is noted extending from the hila into the medial upper lobes. No convincing pleural effusion and no pneumothorax. Right internal jugular central venous catheter is stable, tip in the lower superior vena cava. Cardiac silhouette is mildly enlarged. No mediastinal or hilar masses. No evidence of adenopathy. Skeletal structures are grossly intact. IMPRESSION: 1. Hazy opacity is noted in lungs, representing a change from the most recent prior chest radiograph. Findings may be due to multifocal pneumonia or pulmonary edema. Electronically Signed   By: Lajean Manes M.D.   On: 02/19/2020 21:41    PERFORMANCE STATUS (ECOG) : 3 - Symptomatic, >50% confined to bed  Review of Systems Unless otherwise noted, a complete review of systems is negative.  Physical Exam General: NAD, frail appearing Pulmonary: Unlabored on O2 Extremities: no edema, no joint deformities Skin: no rashes Neurological: Weakness but otherwise  nonfocal  IMPRESSION: I met with patient in the ED.  She says that she feels generally lousy with extreme fatigue and some shortness of breath.  She is being admitted to the hospital for management of CHF/pneumonia.  She is also found to have hemoptysis with recent diagnosis of PE on anticoagulation.  Patient seems to recognize that overall she is doing poorly.  She recently saw Dr. Janese Banks and had a thorough discussion about her overall prognosis and the possible limitation of treatment options.  We discussed the option of hospice today.  However, her current goals are aligned with hospitalization and she would like to continue the current scope of treatment for now.  We discussed CODE STATUS.  Patient says that she thinks she wants to remain a Full Code for now. She would not be interested in long-term ventilatory support.  She would want to defer to her sons for decision-making if necessary. She verbalized understanding that resuscitation would likely ultimately prove futile and she agreed to continue thinking about decision-making in light of her advanced/terminal cancer.  Patient says that she would be interested in completing advance care planning documents if possible while she is hospitalized.  I will consult the chaplain to assist with this.  PLAN: -Continue current scope of treatment -Chaplain to assist with ACP documents -Will follow  Case and plan discussed with Dr. Janese Banks  Patient expressed understanding and was in agreement with this plan. She also understands that She can call the clinic at any time with any questions, concerns, or complaints.     Time Total: 60 minutes  Visit consisted of counseling and education dealing with the complex and emotionally intense issues of symptom management and palliative care in the setting of serious and potentially life-threatening illness.Greater than 50%  of this time was spent counseling and coordinating care related to the above assessment and  plan.  Signed by: Altha Harm, PhD, NP-C

## 2020-02-20 NOTE — ED Notes (Signed)
Admitting MD at bedside.

## 2020-02-21 DIAGNOSIS — I2510 Atherosclerotic heart disease of native coronary artery without angina pectoris: Secondary | ICD-10-CM

## 2020-02-21 DIAGNOSIS — I509 Heart failure, unspecified: Secondary | ICD-10-CM

## 2020-02-21 LAB — THYROID PANEL WITH TSH
Free Thyroxine Index: 2.3 (ref 1.2–4.9)
T3 Uptake Ratio: 27 % (ref 24–39)
T4, Total: 8.5 ug/dL (ref 4.5–12.0)
TSH: 11.2 u[IU]/mL — ABNORMAL HIGH (ref 0.450–4.500)

## 2020-02-21 LAB — CBC WITH DIFFERENTIAL/PLATELET
Abs Immature Granulocytes: 0.05 10*3/uL (ref 0.00–0.07)
Basophils Absolute: 0 10*3/uL (ref 0.0–0.1)
Basophils Relative: 0 %
Eosinophils Absolute: 0 10*3/uL (ref 0.0–0.5)
Eosinophils Relative: 0 %
HCT: 29.1 % — ABNORMAL LOW (ref 36.0–46.0)
Hemoglobin: 9.3 g/dL — ABNORMAL LOW (ref 12.0–15.0)
Immature Granulocytes: 1 %
Lymphocytes Relative: 7 %
Lymphs Abs: 0.5 10*3/uL — ABNORMAL LOW (ref 0.7–4.0)
MCH: 31.6 pg (ref 26.0–34.0)
MCHC: 32 g/dL (ref 30.0–36.0)
MCV: 99 fL (ref 80.0–100.0)
Monocytes Absolute: 0.4 10*3/uL (ref 0.1–1.0)
Monocytes Relative: 6 %
Neutro Abs: 6.6 10*3/uL (ref 1.7–7.7)
Neutrophils Relative %: 86 %
Platelets: 92 10*3/uL — ABNORMAL LOW (ref 150–400)
RBC: 2.94 MIL/uL — ABNORMAL LOW (ref 3.87–5.11)
RDW: 17.6 % — ABNORMAL HIGH (ref 11.5–15.5)
WBC: 7.5 10*3/uL (ref 4.0–10.5)
nRBC: 0.3 % — ABNORMAL HIGH (ref 0.0–0.2)

## 2020-02-21 LAB — BASIC METABOLIC PANEL
Anion gap: 9 (ref 5–15)
BUN: 32 mg/dL — ABNORMAL HIGH (ref 8–23)
CO2: 28 mmol/L (ref 22–32)
Calcium: 8.2 mg/dL — ABNORMAL LOW (ref 8.9–10.3)
Chloride: 101 mmol/L (ref 98–111)
Creatinine, Ser: 1.34 mg/dL — ABNORMAL HIGH (ref 0.44–1.00)
GFR calc Af Amer: 46 mL/min — ABNORMAL LOW (ref >60)
GFR calc non Af Amer: 40 mL/min — ABNORMAL LOW (ref >60)
Glucose, Bld: 142 mg/dL — ABNORMAL HIGH (ref 70–99)
Potassium: 4.1 mmol/L (ref 3.5–5.1)
Sodium: 138 mmol/L (ref 135–145)

## 2020-02-21 LAB — PROTIME-INR
INR: 1.6 — ABNORMAL HIGH (ref 0.8–1.2)
Prothrombin Time: 18.2 seconds — ABNORMAL HIGH (ref 11.4–15.2)

## 2020-02-21 LAB — MAGNESIUM: Magnesium: 2.1 mg/dL (ref 1.7–2.4)

## 2020-02-21 LAB — MRSA PCR SCREENING: MRSA by PCR: NEGATIVE

## 2020-02-21 LAB — PROCALCITONIN: Procalcitonin: <0.1 ng/mL

## 2020-02-21 MED ORDER — FUROSEMIDE 40 MG PO TABS
40.0000 mg | ORAL_TABLET | Freq: Every day | ORAL | Status: DC
  Administered 2020-02-21 – 2020-02-22 (×2): 40 mg via ORAL
  Filled 2020-02-21 (×2): qty 1

## 2020-02-21 MED ORDER — CHLORHEXIDINE GLUCONATE CLOTH 2 % EX PADS: 6.0000 | MEDICATED_PAD | Freq: Every day | CUTANEOUS | Status: DC

## 2020-02-21 NOTE — Discharge Summary (Signed)
[facility] NAME: Jaime Hall    MR#:  [id number]  DATE OF BIRTH:  08-10-1950  DATE OF ADMISSION:  02/14/2020   ADMITTING PHYSICIAN: Collier Bullock, MD  DATE OF DISCHARGE: 02/16/2020  1:17 PM  PRIMARY CARE PHYSICIAN: [name], MD   ADMISSION DIAGNOSIS:  Acute pulmonary embolism (HCC) [I26.99] Acute pulmonary embolism without acute cor pulmonale, unspecified pulmonary embolism type ([place]) [I26.99] DISCHARGE DIAGNOSIS:  Principal Problem:   Acute pulmonary embolism (HCC) Active Problems:   Endometrial cancer ([place])   Acute on chronic HFrEF (heart failure with reduced ejection fraction) (HCC)   Thrombocytopenia (HCC)   Hypothyroidism  SECONDARY DIAGNOSIS:   Past Medical History:  Diagnosis Date  . Anxiety   . Cervicalgia   . CHF (congestive heart failure) ([place])   . Chronic kidney disease   . Coronary artery disease    a. 02/2012 Stress echo: severe anterior wall ischemia;  b. 02/2012 Cath/PCI: LAD 95p (3.0 x 15 Xience EX DES), D1 90ost (PTCA - bifurcational dzs), EF 45% with anterior HK;  b. 02/2013 Ex MV: fixed anterior defect w/ minor reversibility, nl EF-->Med Rx.  . Endometrial cancer ([place])    a. 07/2016 s/p robotic hysterectomy, BSO w/ washings, sentinel node inj, mapping, bx, adhesiolysis.  . Essential hypertension, benign   . Fibrocystic breast disease   . GERD (gastroesophageal reflux disease)   . Gestational hypertension   . Heart murmur   . History of anemia   . History of blood transfusion   . Hodgkin's lymphoma ([place]) 2011   a. s/p radiation and chemo therapy  . Osteoarthritis   . Polycystic ovarian disease    HOSPITAL COURSE:  69 year old female with a known history of endometrial cancer on immunotherapy, hypertension, chronic systolic and diastolic heart failure is admitted for acute pulmonary embolism  Acute pulmonary embolism Treated with IV heparin on admission and-transitioned to oral Eliquis per onco. She  initially tolerated without any immediate complications/bleeding.  Metastatic endometrial cancer/peritoneal carcinomatosis On chemo per oncology  Chronic systolic heart failure EF of 25 to 30% Continue Coreg, she was given 40 mg IV lasix on the day of D/C as she received IVFs during hospitalization and might have iatrogenic fluid overload. She was requested to take Lasix 20 mg daily (even though she used to take as needed basis) at D/C at least for next 3 days.  Weakness/fatigue with history of hypothyroidism Continue levothyroxine, TSH elevated, consider rechecking TSH and increasing the dose of levothyroxine as appropriate.  Anxiety/depression Continue Ativan and Cymbalta  Patient was feeling much better and was discharged home in stable condition with outpt f/up with Onco, Cardio and her PCP.   DISCHARGE CONDITIONS:  stable CONSULTS OBTAINED:  Treatment Team:  Sindy Guadeloupe, MD DRUG ALLERGIES:   Allergies  Allergen Reactions  . Adhesive [Tape] Other (See Comments)    Skin Irritation   . Antifungal [Miconazole Nitrate] Rash  . Sulfa Antibiotics Nausea And Vomiting and Rash  . Z-Pak [Azithromycin] Itching   DISCHARGE MEDICATIONS:   Allergies as of 02/16/2020      Reactions   Adhesive [tape] Other (See Comments)   Skin Irritation   Antifungal [miconazole Nitrate] Rash   Sulfa Antibiotics Nausea And Vomiting, Rash   Z-pak [azithromycin] Itching      Medication List    TAKE these medications   apixaban 5 MG Tabs tablet Commonly known as: ELIQUIS Take 2 tablets (10 mg total) by mouth 2 (two) times daily.  10 mg po BID for 7 days followed by 5 mg po BID   aspirin EC 81 MG tablet Take 81 mg by mouth at bedtime.   atorvastatin 10 MG tablet Commonly known as: LIPITOR Take 1 tablet by mouth once daily What changed: when to take this   carvedilol 3.125 MG tablet Commonly known as: Coreg Take 1 tablet (3.125 mg total) by mouth 2 (two) times daily with a meal.    chlorpheniramine-HYDROcodone 10-8 MG/5ML Suer Commonly known as: Tussionex Pennkinetic ER Take 5 mLs by mouth every 12 (twelve) hours as needed for cough.   cholecalciferol 25 MCG (1000 UNIT) tablet Commonly known as: VITAMIN D3 Take 1,000 Units by mouth in the morning and at bedtime.   cyanocobalamin 1000 MCG/ML injection Commonly known as: (VITAMIN B-12) INJECT 1ML IM WEEKLY FOR 4 WEEKS, THEN INJECT MONTHLY THEREAFTER What changed:  how much to take how to take this when to take this additional instructions   DULoxetine 30 MG capsule Commonly known as: CYMBALTA Take 1 capsule (30 mg total) by mouth daily.   furosemide 20 MG tablet Commonly known as: Lasix Take 1 tablet (20 mg total) by mouth as needed for fluid or edema (Take one tablet daily as needed for lower extremity edema or for weight gain of 2 lbs overnight or 5 lbs in one week.). What changed: when to take this   guaiFENesin 600 MG 12 hr tablet Commonly known as: Mucinex Take 1 tablet (600 mg total) by mouth 2 (two) times daily for 5 days.   Lenvima (10 MG Daily Dose) capsule Generic drug: lenvatinib 10 mg daily dose Take 1 capsule (10 mg total) by mouth daily. What changed: when to take this   levothyroxine 50 MCG tablet Commonly known as: Synthroid Take 1 tablet (50 mcg total) by mouth daily before breakfast.   LORazepam 0.5 MG tablet Commonly known as: ATIVAN Take 1 tablet (0.5 mg total) by mouth every 6 (six) hours as needed for anxiety (and to help with breathing).   losartan 25 MG tablet Commonly known as: COZAAR Take 0.5 tablets (12.5 mg total) by mouth daily.   pantoprazole 20 MG tablet Commonly known as: Protonix Take 1 tablet (20 mg total) by mouth daily.   predniSONE 10 MG (21) Tbpk tablet Commonly known as: STERAPRED UNI-PAK 21 TAB Start 60 mg po daily, taper 10 mg daily until done      DISCHARGE INSTRUCTIONS:   DIET:  Cardiac diet DISCHARGE CONDITION:  Stable ACTIVITY:   Activity as tolerated OXYGEN:  Home Oxygen: No.  Oxygen Delivery: room air DISCHARGE LOCATION:  home   If you experience worsening of your admission symptoms, develop shortness of breath, life threatening emergency, suicidal or homicidal thoughts you must seek medical attention immediately by calling 911 or calling your MD immediately  if symptoms less severe.  You Must read complete instructions/literature along with all the possible adverse reactions/side effects for all the Medicines you take and that have been prescribed to you. Take any new Medicines after you have completely understood and accpet all the possible adverse reactions/side effects.   Please note  You were cared for by a hospitalist during your hospital stay. If you have any questions about your discharge medications or the care you received while you were in the hospital after you are discharged, you can call the unit and asked to speak with the hospitalist on call if the hospitalist that took care of you is not available. Once you are discharged,  your primary care physician will handle any further medical issues. Please note that NO REFILLS for any discharge medications will be authorized once you are discharged, as it is imperative that you return to your primary care physician (or establish a relationship with a primary care physician if you do not have one) for your aftercare needs so that they can reassess your need for medications and monitor your lab values.    On the day of Discharge:  VITAL SIGNS:  Blood pressure 101/60, pulse 88, temperature 97.7 F (36.5 C), temperature source Oral, resp. rate 17, height 5\' 1"  (1.549 m), weight 73.1 kg, SpO2 94 %. PHYSICAL EXAMINATION:  GENERAL:  69 y.o.-year-old patient lying in the bed with no acute distress.  EYES: Pupils equal, round, reactive to light and accommodation. No scleral icterus. Extraocular muscles intact.  HEENT: Head atraumatic, normocephalic. Oropharynx and  nasopharynx clear.  NECK:  Supple, no jugular venous distention. No thyroid enlargement, no tenderness.  LUNGS: Normal breath sounds bilaterally, no wheezing, rales,rhonchi or crepitation. No use of accessory muscles of respiration.  CARDIOVASCULAR: S1, S2 normal. No murmurs, rubs, or gallops.  ABDOMEN: Soft, non-tender, non-distended. Bowel sounds present. No organomegaly or mass.  EXTREMITIES: No pedal edema, cyanosis, or clubbing.  NEUROLOGIC: Cranial nerves II through XII are intact. Muscle strength 5/5 in all extremities. Sensation intact. Gait not checked.  PSYCHIATRIC: The patient is alert and oriented x 3.  SKIN: No obvious rash, lesion, or ulcer.  DATA REVIEW:   CBC Recent Labs  Lab 02/21/20 0427  WBC 7.5  HGB 9.3*  HCT 29.1*  PLT 92*    Chemistries  Recent Labs  Lab 02/19/20 2125 02/20/20 0411 02/21/20 0427  NA 142   < > 138  K 3.3*   < > 4.1  CL 104   < > 101  CO2 25   < > 28  GLUCOSE 118*   < > 142*  BUN 26*   < > 32*  CREATININE 1.43*   < > 1.34*  CALCIUM 8.7*   < > 8.2*  MG  --    < > 2.1  AST 31  --   --   ALT 18  --   --   ALKPHOS 63  --   --   BILITOT 2.3*  --   --    < > = values in this interval not displayed.     Outpatient follow-up  Follow-up Information    [name], MD. Schedule an appointment as soon as possible for a visit on 02/23/2020.   Specialty: Internal Medicine Why: @ 11am Contact information: [address] [place] [address] [phone]        Wellington Hampshire, MD. Go on 02/22/2020.   Specialty: Cardiology Why: @ 4 pm as scheduled Contact information: [place] [address] [phone]        Sindy Guadeloupe, MD. Go on 02/22/2020.   Specialty: Oncology Why: @ noon as scheduled Contact information: [place] [address] [phone]                Management plans discussed with the patient, family and they are in agreement.  CODE  STATUS: DNR   TOTAL TIME TAKING CARE OF THIS PATIENT: 45 minutes.    Max Sane M.D on 02/21/2020 at 8:35 AM  [facility]   CC: Primary care physician; [name], MD   Note: This dictation was  prepared with Dragon dictation along with smaller phrase technology. Any transcriptional errors that result from this process are unintentional.

## 2020-02-21 NOTE — Progress Notes (Signed)
PHARMACY - PHYSICIAN COMMUNICATION CRITICAL VALUE ALERT - BLOOD CULTURE IDENTIFICATION (BCID)  Jaime Hall is an 69 y.o. female who presented to [facility] on 02/19/2020 with a chief complaint of shortness of breath. PMH includes CHF, CAD, Hodgkin's lymphoma, uterine cancer, endometrial cancer, recent PE, and HTN.   Assessment:   2/4 bottles + staph epidermis, MecA/C detected  Name of physician (or Provider) Contacted: NP Rufina Falco  Current antibiotics: ceftriaxone and doxycycline  Changes to prescribed antibiotics recommended:  Discontinue ceftriaxone and doxycycline. Start vancomycin 1750 mg x 1 dose, followed by 1000 mg q24H. Recommendations accepted by provider  Results for orders placed or performed during the hospital encounter of 02/19/20  Blood Culture ID Panel (Reflexed) (Collected: 02/19/2020  9:25 PM)  Result Value Ref Range   Enterococcus faecalis NOT DETECTED NOT DETECTED   Enterococcus Faecium NOT DETECTED NOT DETECTED   Listeria monocytogenes NOT DETECTED NOT DETECTED   Staphylococcus species DETECTED (A) NOT DETECTED   Staphylococcus aureus (BCID) NOT DETECTED NOT DETECTED   Staphylococcus epidermidis DETECTED (A) NOT DETECTED   Staphylococcus lugdunensis NOT DETECTED NOT DETECTED   Streptococcus species NOT DETECTED NOT DETECTED   Streptococcus agalactiae NOT DETECTED NOT DETECTED   Streptococcus pneumoniae NOT DETECTED NOT DETECTED   Streptococcus pyogenes NOT DETECTED NOT DETECTED   A.calcoaceticus-baumannii NOT DETECTED NOT DETECTED   Bacteroides fragilis NOT DETECTED NOT DETECTED   Enterobacterales NOT DETECTED NOT DETECTED   Enterobacter cloacae complex NOT DETECTED NOT DETECTED   Escherichia coli NOT DETECTED NOT DETECTED   Klebsiella aerogenes NOT DETECTED NOT DETECTED   Klebsiella oxytoca NOT DETECTED NOT DETECTED   Klebsiella pneumoniae NOT DETECTED NOT DETECTED   Proteus species NOT DETECTED NOT DETECTED   Salmonella species NOT DETECTED NOT  DETECTED   Serratia marcescens NOT DETECTED NOT DETECTED   Haemophilus influenzae NOT DETECTED NOT DETECTED   Neisseria meningitidis NOT DETECTED NOT DETECTED   Pseudomonas aeruginosa NOT DETECTED NOT DETECTED   Stenotrophomonas maltophilia NOT DETECTED NOT DETECTED   Candida albicans NOT DETECTED NOT DETECTED   Candida auris NOT DETECTED NOT DETECTED   Candida glabrata NOT DETECTED NOT DETECTED   Candida krusei NOT DETECTED NOT DETECTED   Candida parapsilosis NOT DETECTED NOT DETECTED   Candida tropicalis NOT DETECTED NOT DETECTED   Cryptococcus neoformans/gattii NOT DETECTED NOT DETECTED   Methicillin resistance mecA/C DETECTED (A) NOT DETECTED    Benn Moulder, PharmD Pharmacy Resident  02/21/2020 12:51 PM

## 2020-02-21 NOTE — Progress Notes (Signed)
Progress Note  Patient Name: Jaime Hall Date of Encounter: 02/21/2020  Primary Cardiologist: [name], MD   Subjective   Reports improving shortness of breath and overall breathing status.  No chest pain.  No racing heart rate or palpitations since earlier episode of NSVT.  No further hemoptysis.  No further nausea.  Overall, feels improved when compared with yesterday.  Still feels as if she is holding onto volumes.  Reports adequate urine output since initiation of IV diuresis.  Inpatient Medications    Scheduled Meds: . atorvastatin  10 mg Oral QPM  . carvedilol  3.125 mg Oral BID WC  . cholecalciferol  1,000 Units Oral Daily  . [START ON 02/26/2020] cyanocobalamin  1,000 mcg Subcutaneous Q30 days  . DULoxetine  30 mg Oral Daily  . furosemide  40 mg Intravenous Q12H  . guaiFENesin  600 mg Oral BID  . ipratropium-albuterol  3 mL Nebulization Q4H  . lenvatinib 10 mg daily dose  10 mg Oral QPM  . levothyroxine  62.5 mcg Oral QAC breakfast  . losartan  12.5 mg Oral Daily  . methylPREDNISolone (SOLU-MEDROL) injection  40 mg Intravenous Q12H  . nystatin  1 application Topical QID  . pantoprazole  20 mg Oral Daily  . sodium chloride flush  3 mL Intravenous Q12H   Continuous Infusions: . sodium chloride    . vancomycin     PRN Meds: sodium chloride, acetaminophen, ALPRAZolam, chlorpheniramine-HYDROcodone, LORazepam, morphine CONCENTRATE, sodium chloride flush   Vital Signs    Vitals:   02/21/20 0500 02/21/20 0600 02/21/20 0859 02/21/20 0900  BP: 105/60 (!) 128/95 116/70 112/70  Hall: 88 78 81 85  Resp: 20 13  16   Temp:      TempSrc:      SpO2: 100% 100%  100%  Weight:      Height:        Intake/Output Summary (Last 24 hours) at 02/21/2020 0914 Last data filed at 02/21/2020 0909 Gross per 24 hour  Intake 350 ml  Output 1800 ml  Net -1450 ml   Last 3 Weights 02/19/2020 02/19/2020 02/16/2020  Weight (lbs) 158 lb 11.2 oz 156 lb 161 lb 1.6 oz  Weight (kg) 71.986  kg 70.761 kg 73.074 kg      Telemetry    NSR, PVCs- Personally Reviewed  ECG    No new tracings - Personally Reviewed  Physical Exam   GEN: No acute distress.   Neck: No JVD Cardiac: RRR, 1/6 systolic murmur. No rubs, or gallops.  Respiratory: Bilateral crackles though slightly improved from yesterday. GI: Soft, nontender, non-distended  MS: Trace to mild b/l LEE; No deformity. RUSB portacath Neuro:  Nonfocal  Psych: Normal affect   Labs    High Sensitivity Troponin:   Recent Labs  Lab 02/14/20 0452 02/14/20 1200 02/19/20 2125 02/20/20 0025  TROPONINIHS 25* 26* 24* 30*      Chemistry Recent Labs  Lab 02/19/20 2125 02/20/20 0411 02/21/20 0427  NA 142 142 138  K 3.3* 3.2* 4.1  CL 104 101 101  CO2 25 25 28   GLUCOSE 118* 134* 142*  BUN 26* 25* 32*  CREATININE 1.43* 1.26* 1.34*  CALCIUM 8.7* 8.4* 8.2*  PROT 6.4*  --   --   ALBUMIN 3.6  --   --   AST 31  --   --   ALT 18  --   --   ALKPHOS 63  --   --   BILITOT 2.3*  --   --  GFRNONAA 37* 43* 40*  GFRAA 43* 50* 46*  ANIONGAP 13 16* 9     Hematology Recent Labs  Lab 02/19/20 2125 02/19/20 2125 02/20/20 0411 02/20/20 1626 02/21/20 0427  WBC 15.6*  --  13.1*  --  7.5  RBC 3.13*  --  3.04*  --  2.94*  HGB 9.9*   < > 9.7* 9.5* 9.3*  HCT 30.7*   < > 29.9* 29.4* 29.1*  MCV 98.1  --  98.4  --  99.0  MCH 31.6  --  31.9  --  31.6  MCHC 32.2  --  32.4  --  32.0  RDW 17.9*  --  17.6*  --  17.6*  PLT 138*  --  127*  --  92*   < > = values in this interval not displayed.    BNP Recent Labs  Lab 02/19/20 2125 02/20/20 0411  BNP 2,481.1* 7,564.3*     DDimer No results for input(s): DDIMER in the last 168 hours.   Radiology    US Venous Img Lower Bilateral (DVT)  Result Date: 02/20/2020 CLINICAL DATA:  PE EXAM: BILATERAL LOWER EXTREMITY VENOUS DOPPLER ULTRASOUND TECHNIQUE: Gray-scale sonography with compression, as well as color and duplex ultrasound, were performed to evaluate the deep venous  system(s) from the level of the common femoral vein through the popliteal and proximal calf veins. COMPARISON:  None. FINDINGS: VENOUS Normal compressibility of the common femoral, superficial femoral, and popliteal veins, as well as the visualized calf veins (peroneal vein not well visualized on the left). Visualized portions of profunda femoral vein and great saphenous vein unremarkable. No filling defects to suggest DVT on grayscale or color Doppler imaging. Doppler waveforms show normal direction of venous flow, normal respiratory plasticity and response to augmentation. OTHER None. Limitations: none IMPRESSION: No evidence of DVT in the lower extremities. Electronically Signed   By: Macy Mis M.D.   On: 02/20/2020 12:08   DG Chest Portable 1 View  Result Date: 02/19/2020 CLINICAL DATA:  Pt arrives via [facility] from home with complaint of SOB. Pt low 70's% on RA, pt with blue lips. EMS administered 1 duoneb and placed on 8L NRB, spo2 100%. Pt tachy in the low 100's for HR EXAM: PORTABLE CHEST 1 VIEW COMPARISON:  02/13/2020 FINDINGS: Hazy opacities are noted in both lungs. Scarring is noted extending from the hila into the medial upper lobes. No convincing pleural effusion and no pneumothorax. Right internal jugular central venous catheter is stable, tip in the lower superior vena cava. Cardiac silhouette is mildly enlarged. No mediastinal or hilar masses. No evidence of adenopathy. Skeletal structures are grossly intact. IMPRESSION: 1. Hazy opacity is noted in lungs, representing a change from the most recent prior chest radiograph. Findings may be due to multifocal pneumonia or pulmonary edema. Electronically Signed   By: Lajean Manes M.D.   On: 02/19/2020 21:41    Cardiac Studies   12/29/2019 Echo 1. Left ventricular ejection fraction, by estimation, is 30 to 35%. The  left ventricle has moderately decreased function. The left ventricle  demonstrates global hypokinesis. Left ventricular  diastolic parameters are  consistent with Grade I diastolic  dysfunction (impaired relaxation).  2. Right ventricular systolic function is normal. The right ventricular  size is normal. There is mildly elevated pulmonary artery systolic  pressure. The estimated right ventricular systolic pressure is 32.9 mmHg.  3. The mitral valve is normal in structure. Mild mitral valve  regurgitation.  4. The aortic valve is normal in  structure. Aortic valve regurgitation is  mild.   12/04/2019 R/LHC  There is moderate to severe left ventricular systolic dysfunction.  LV end diastolic pressure is moderately elevated.  Previously placed Prox LAD stent (unknown type) is widely patent.  Ost LAD to Prox LAD lesion is 40% stenosed. 1. Patent LAD stent with no significant restenosis. There is moderate stenosis in ostial LAD before the stent. No evidence of obstructive disease overall. 2. Moderately to severely reduced LV systolic function with an EF of 30 to 35%. 3. Right heart catheterization showed mildly elevated filling pressures with pulmonary capillary wedge pressure of18 mmHg, mild to moderate pulmonary hypertension at 44/19 with a mean of 33 mmHg, and mildly reduced cardiac output at 3.89 with a cardiac index of 2.24. Pulmonary vascular resistance is 3.8 [place] units Recommendations: Continue medical therapy for coronary artery disease. 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 She is mildly volume overloaded and I added small dose furosemide. Recommend medical therapy for cardiomyopathy.  11/02/2019 Echo 1. Left ventricular ejection fraction, by estimation, is 30 to 35%. The  left ventricle has moderately decreased function. The left ventricle  demonstrates global hypokinesis. Left ventricular diastolic parameters are  consistent with Grade I diastolic  dysfunction (impaired relaxation).  2. Right ventricular systolic function is normal.  The right ventricular  size is normal. There is mildly elevated pulmonary artery systolic  pressure. The estimated right ventricular systolic pressure is 82.9 mmHg.  3. Mild to moderate mitral valve regurgitation.  4. Aortic valve regurgitation is mild to moderate.   Patient Profile     69 y.o. 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   Assessment & Plan    Elevated high-sensitivity troponin CAD s/p stenting and angioplasty --No CP.  HS Tn minimally elevated and flat trending.  --Suspect HS Tn elevated 2/2 supply demand ischemia in the setting of her comorbid conditions, includingcurrent pulmonary embolism, NSVT, AKI,  suspected pna / sepsis, lymphoma with current chemotherapy, anemia pulmonary HTN, low output HFwith known reduced CO,valular dz. 11/2019 LHC as above. S/p DES to pLAD with R/LHC as above with NICM thought 2/2 chemotherapy Previous echo as above with EF 30-35% and RVSP 36.42mmHg.  --At this time, no indication forfurther ischemic workup with cardiac cathterization.  --Continue medical management, aggressive risk factor modification.  Acute on chronic HFrEF Pulmonary HTN --Improving SOB.  Remains on nasal cannula oxygen. As previously noted, suspect exacerbation of volume status 2/2 receiving fluids during a previous admission with lasix held and reduced EF and CO.  --Echo with EF stable at 30-35%, G1DD, RVSP 36.28mmHg.As above, previous echo at [facility] shows reduced EF 25%.  --Still volume overloaded on exam, though volume status significantly improved since initiation of IV Lasix.  Given overnight slight bump in Cr with corresponding  soft BP and SBP 90s overnight, recommend reduce or hold antihypertensives with further soft pressures to prevent prerenal AKI with ongoing diuresis given still volume up on exam. Continue to monitor Cr closely on IV diuresis with adjustment as needed if further increase in Cr.  --Avoid IVF given low CO/EF as above. --Monitor I/Os, daily weights. --Net negative 1.6L yesterday. Still need weight.  --Daily  BMET.  --Continue current medicationsas BP and renal function allow.  Pulmonary embolism --Seen on previous imagining as CTA showing acute segmental emboli to the right lower lobe. Started on [facility] with Eliquis at that time. Anticoagulation / Eliquis on hold since yesterday due to reported hemoptysis per pulmonology.  Also received Kcentra.  As previously noted, consider that pt is in a  hypercoagulable state. Continue to trend H&H with daily CBC.  Further management per pulmonology.  Reported Hemoptysis --Pt denies further episodes.   --As above, [place] on hold since 9/7 & received Kcentra 9/7. --Management of [place] per pulmonology. --Daily CBC, trend hemoglobin/hematocrit.   NSVT --No further episodes of NSVT on telemetry today.  Earlier episode of NSVT at [facility] on 9/7.  --Consider as 2/2 hypokalemia /electrolyte abnormality.   --Maintain electrolytes at goal to reduce risk of arrhythmia.  --Continue rate control with Coreg and escalate as BP allows.   Abnormal TSH --Per IM, PCP. Ordered thyroid panel for further workup as this will contribute to elevated rates and arrhythmia.   History of orthostasis Hypertension --History of soft BP and orthostasis with diuresis. Continue current  low-dose Coreg and ARB with  diuresis and adjust as needed / outlined above to avoid prerenal AKI.    Valvular heart dz:  Mild to moderate MR/AR --Updatedecho as above with stable MR/AR.Continue to monitorwith periodic outpatient echos.   Anemia --Daily CBC.Current H&H stable. Transfuse below 8.0.    HLD --Continue statin.    For questions or updates, please contact [place] Please consult [email or web address] for contact info under        Signed, Arvil Chaco, PA-C  02/21/2020, 9:14 AM

## 2020-02-21 NOTE — Progress Notes (Signed)
[place]  [phone] [phone]   Name: Jaime Hall Date: 02/21/2020 MRN: [id number]  DOB: 1950-11-25  Patient Care Team: [name], MD as PCP - General (Internal Medicine) Wellington Hampshire, MD as PCP - Cardiology (Cardiology) [name], MD as Consulting Physician (General Surgery) Mellody Drown, MD as Referring Physician (Obstetrics and Gynecology) [name], MD as Referring Physician (Obstetrics and Gynecology) Hollice Espy, MD as Consulting Physician (Urology) [name], RN as Registered Nurse Sindy Guadeloupe, MD as Consulting Physician (Oncology)    REASON FOR CONSULTATION: Jaime Hall is a 69 y.o. 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 Patient was originally diagnosed in 2018 with stage I endometrial cancer and underwent TAH/BSO and adjuvant chemotherapy but unfortunately was found to have disease recurrence in May 2021.  Patient was recently hospitalized 02/14/2020-02/15/2020 with acute hypoxic respiratory failure and found to have acute segmental pulmonary embolus.  She is now readmitted 02/20/2020 with hemoptysis and acute on chronic respiratory failure secondary to CHF exacerbation +/- multifocal PNA.  Palliative care was consulted to help address goals..   CODE STATUS: DNR  PAST MEDICAL HISTORY: Past Medical History:  Diagnosis Date  . Anxiety   . Cervicalgia   . CHF (congestive heart failure) ([place])   . Chronic kidney disease   . Coronary artery disease    a. 02/2012 Stress echo: severe anterior wall ischemia;  b. 02/2012 Cath/PCI: LAD 95p (3.0 x 15 Xience EX DES), D1 90ost (PTCA - bifurcational dzs), EF 45% with anterior HK;  b. 02/2013 Ex MV: fixed anterior defect w/ minor reversibility, nl EF-->Med Rx.  . Endometrial cancer ([place])    a. 07/2016 s/p  robotic hysterectomy, BSO w/ washings, sentinel node inj, mapping, bx, adhesiolysis.  . Essential hypertension, benign   . Fibrocystic breast disease   . GERD (gastroesophageal reflux disease)   . Gestational hypertension   . Heart murmur   . History of anemia   . History of blood transfusion   . Hodgkin's lymphoma ([place]) 2011   a. s/p radiation and chemo therapy  . Osteoarthritis   . Polycystic ovarian disease     PAST SURGICAL HISTORY:  Past Surgical History:  Procedure Laterality Date  . ABDOMINAL HYSTERECTOMY    . bladder sling    . CARDIAC CATHETERIZATION  02/2012   [facility] 1 stent place  . CERVICAL POLYPECTOMY    . CHOLECYSTECTOMY  1982  . COLONOSCOPY WITH PROPOFOL N/A 02/05/2015   Procedure: COLONOSCOPY WITH PROPOFOL;  Surgeon: Lucilla Lame, MD;  Location: [facility] ENDOSCOPY;  Service: Endoscopy;  Laterality: N/A;  . CORONARY ANGIOPLASTY  02/2012   left/right s/p balloon  . CYSTOGRAM N/A 08/17/2016   Procedure: CYSTOGRAM;  Surgeon: Hollice Espy, MD;  Location: [facility] ORS;  Service: Urology;  Laterality: N/A;  . CYSTOSCOPY N/A 08/17/2016   Procedure: CYSTOSCOPY EXAM UNDER ANESTHESIA;  Surgeon: Hollice Espy, MD;  Location: [facility] ORS;  Service: Urology;  Laterality: N/A;  . CYSTOSCOPY W/ RETROGRADES Bilateral 08/17/2016   Procedure: CYSTOSCOPY WITH RETROGRADE PYELOGRAM;  Surgeon: Hollice Espy, MD;  Location: [facility] ORS;  Service: Urology;  Laterality: Bilateral;  . CYSTOSCOPY WITH STENT PLACEMENT Right 08/17/2016   Procedure: CYSTOSCOPY WITH STENT PLACEMENT;  Surgeon: Hollice Espy, MD;  Location: [facility] ORS;  Service: Urology;  Laterality: Right;  . heart stent'  2013  . kidney stent Right 2018  .  LYMPH NODE BIOPSY  2011   diagnosis of hodgkins lymphoma  . PELVIC LYMPH NODE DISSECTION N/A 07/29/2016   Procedure: PELVIC/AORTIC LYMPH NODE SAMPLING;  Surgeon: [name], MD;  Location: [facility] ORS;  Service: Gynecology;  Laterality: N/A;  . PORTA CATH INSERTION N/A 09/22/2016    Procedure: Glori Luis Cath Insertion;  Surgeon: [name], MD;  Location: [place] [facility];  Service: Cardiovascular;  Laterality: N/A;  . PORTA CATH INSERTION N/A 10/27/2019   Procedure: PORTA CATH INSERTION;  Surgeon: [name], MD;  Location: [place] [facility];  Service: Cardiovascular;  Laterality: N/A;  . PORTA CATH REMOVAL N/A 11/17/2016   Procedure: Glori Luis Cath Removal;  Surgeon: [name], MD;  Location: [place] [facility];  Service: Cardiovascular;  Laterality: N/A;  . RIGHT/LEFT HEART CATH AND CORONARY ANGIOGRAPHY N/A 12/04/2019   Procedure: RIGHT/LEFT HEART CATH AND CORONARY ANGIOGRAPHY;  Surgeon: Wellington Hampshire, MD;  Location: [place] [facility];  Service: Cardiovascular;  Laterality: N/A;  . ROBOTIC ASSISTED TOTAL HYSTERECTOMY WITH BILATERAL SALPINGO OOPHERECTOMY N/A 07/29/2016   Procedure: ROBOTIC ASSISTED TOTAL HYSTERECTOMY WITH BILATERAL SALPINGO OOPHORECTOMY;  Surgeon: [name], MD;  Location: [facility];  Service: Gynecology;  Laterality: N/A;  . SENTINEL NODE BIOPSY N/A 07/29/2016   Procedure: SENTINEL NODE BIOPSY;  Surgeon: [name], MD;  Location: [facility];  Service: Gynecology;  Laterality: N/A;  . transobturator sling N/A 2009   [place]    HEMATOLOGY/ONCOLOGY HISTORY:  Oncology History  Endometrial cancer ([place])  08/13/2016 Initial Diagnosis   Endometrial cancer ([place])   11/06/2019 - 12/28/2019 Chemotherapy   The patient had palonosetron (ALOXI) injection 0.25 mg, 0.25 mg, Intravenous,  Once, 3 of 4 cycles Administration: 0.25 mg (11/14/2019), 0.25 mg (12/07/2019), 0.25 mg (12/14/2019), 0.25 mg (12/28/2019) pegfilgrastim (NEULASTA ONPRO KIT) injection 6 mg, 6 mg, Subcutaneous, Once, 3 of 4 cycles Administration: 6 mg (11/14/2019), 6 mg (12/14/2019) CARBOplatin (PARAPLATIN) 140 mg in sodium chloride 0.9 % 100 mL chemo infusion, 140 mg (100 % of original dose 141 mg), Intravenous,  Once, 3 of 4 cycles Dose modification:    (original dose 141 mg, Cycle 1) Administration: 140 mg (11/06/2019), 110 mg (12/07/2019), 150 mg (11/14/2019), 110 mg (12/14/2019), 110 mg (12/28/2019) gemcitabine (GEMZAR) 1,400 mg in sodium chloride 0.9 % 250 mL chemo infusion, 1,444 mg (100 % of original dose 800 mg/m2), Intravenous,  Once, 3 of 4 cycles Dose modification: 800 mg/m2 (original dose 800 mg/m2, Cycle 1, Reason: Patient Age) Administration: 1,400 mg (11/06/2019), 1,400 mg (11/14/2019), 1,400 mg (12/07/2019), 1,400 mg (12/14/2019), 1,400 mg (12/28/2019)  for chemotherapy treatment.    01/11/2020 -  Chemotherapy   The patient had lenvatinib 14 mg daily dose (LENVIMA, 14 MG DAILY DOSE,) 10 & 4 MG capsule, 14 mg, Oral, Daily, 1 of 1 cycle, Start date: --, End date: -- pembrolizumab (KEYTRUDA) 200 mg in sodium chloride 0.9 % 50 mL chemo infusion, 200 mg, Intravenous, Once, 2 of 6 cycles Administration: 200 mg (01/11/2020), 200 mg (02/01/2020)  for chemotherapy treatment.    Peritoneal metastases ([place])  10/16/2019 Initial Diagnosis   Peritoneal metastases ([place])   11/06/2019 - 12/28/2019 Chemotherapy   The patient had palonosetron (ALOXI) injection 0.25 mg, 0.25 mg, Intravenous,  Once, 3 of 4 cycles Administration: 0.25 mg (11/14/2019), 0.25 mg (12/07/2019), 0.25 mg (12/14/2019), 0.25 mg (12/28/2019) pegfilgrastim (NEULASTA ONPRO KIT) injection 6 mg, 6 mg, Subcutaneous, Once, 3 of 4 cycles Administration: 6 mg (11/14/2019), 6 mg (12/14/2019) CARBOplatin (PARAPLATIN) 140  mg in sodium chloride 0.9 % 100 mL chemo infusion, 140 mg (100 % of original dose 141 mg), Intravenous,  Once, 3 of 4 cycles Dose modification:   (original dose 141 mg, Cycle 1) Administration: 140 mg (11/06/2019), 110 mg (12/07/2019), 150 mg (11/14/2019), 110 mg (12/14/2019), 110 mg (12/28/2019) gemcitabine (GEMZAR) 1,400 mg in sodium chloride 0.9 % 250 mL chemo infusion, 1,444 mg (100 % of original dose 800 mg/m2), Intravenous,  Once, 3 of 4 cycles Dose modification: 800 mg/m2 (original dose 800  mg/m2, Cycle 1, Reason: Patient Age) Administration: 1,400 mg (11/06/2019), 1,400 mg (11/14/2019), 1,400 mg (12/07/2019), 1,400 mg (12/14/2019), 1,400 mg (12/28/2019)  for chemotherapy treatment.    01/11/2020 -  Chemotherapy   The patient had lenvatinib 14 mg daily dose (LENVIMA, 14 MG DAILY DOSE,) 10 & 4 MG capsule, 14 mg, Oral, Daily, 1 of 1 cycle, Start date: --, End date: -- pembrolizumab (KEYTRUDA) 200 mg in sodium chloride 0.9 % 50 mL chemo infusion, 200 mg, Intravenous, Once, 2 of 6 cycles Administration: 200 mg (01/11/2020), 200 mg (02/01/2020)  for chemotherapy treatment.      ALLERGIES:  is allergic to adhesive [tape], antifungal [miconazole nitrate], sulfa antibiotics, and z-pak [azithromycin].  MEDICATIONS:  Current Facility-Administered Medications  Medication Dose Route Frequency Provider Last Rate Last Admin  . 0.9 %  sodium chloride infusion  250 mL Intravenous PRN Mansy, Jan A, MD      . acetaminophen (TYLENOL) tablet 650 mg  650 mg Oral Q4H PRN Mansy, Jan A, MD      . ALPRAZolam Duanne Moron) tablet 0.25 mg  0.25 mg Oral BID PRN Mansy, Jan A, MD      . atorvastatin (LIPITOR) tablet 10 mg  10 mg Oral QPM Mansy, Jan A, MD   10 mg at 02/20/20 1747  . carvedilol (COREG) tablet 3.125 mg  3.125 mg Oral BID WC Mansy, Jan A, MD   3.125 mg at 02/21/20 0859  . Chlorhexidine Gluconate Cloth 2 % PADS 6 each  6 each Topical Daily Fritzi Mandes, MD      . chlorpheniramine-HYDROcodone (TUSSIONEX) 10-8 MG/5ML suspension 5 mL  5 mL Oral Q12H PRN Mansy, Jan A, MD   5 mL at 02/20/20 1747  . cholecalciferol (VITAMIN D3) tablet 1,000 Units  1,000 Units Oral Daily Mansy, Arvella Merles, MD   1,000 Units at 02/21/20 0859  . [START ON 02/26/2020] cyanocobalamin ((VITAMIN B-12)) injection 1,000 mcg  1,000 mcg Subcutaneous Q30 days Mansy, Jan A, MD      . DULoxetine (CYMBALTA) DR capsule 30 mg  30 mg Oral Daily Mansy, Jan A, MD   30 mg at 02/20/20 0945  . furosemide (LASIX) tablet 40 mg  40 mg Oral Daily Agbor-Etang, Aaron Edelman,  MD      . guaiFENesin (MUCINEX) 12 hr tablet 600 mg  600 mg Oral BID Mansy, Jan A, MD   600 mg at 02/21/20 0900  . ipratropium-albuterol (DUONEB) 0.5-2.5 (3) MG/3ML nebulizer solution 3 mL  3 mL Nebulization Q4H Eugenie Filler, MD   3 mL at 02/21/20 1245  . lenvatinib 10 mg daily dose (LENVIMA) capsule 10 mg  10 mg Oral QPM Mansy, Jan A, MD      . levothyroxine (SYNTHROID) tablet 62.5 mcg  62.5 mcg Oral QAC breakfast Eugenie Filler, MD   62.5 mcg at 02/21/20 0901  . LORazepam (ATIVAN) tablet 0.5 mg  0.5 mg Oral Q6H PRN Mansy, Jan A, MD   0.5 mg at 02/20/20 2110  .  losartan (COZAAR) tablet 12.5 mg  12.5 mg Oral Daily Mansy, Jan A, MD   12.5 mg at 02/20/20 0945  . methylPREDNISolone sodium succinate (SOLU-MEDROL) 40 mg/mL injection 40 mg  40 mg Intravenous Q12H Eugenie Filler, MD   40 mg at 02/21/20 0220  . morphine CONCENTRATE 10 MG/0.5ML oral solution 5 mg  5 mg Oral Q4H PRN Sindy Guadeloupe, MD   5 mg at 02/20/20 1627  . nystatin (MYCOSTATIN/NYSTOP) topical powder 1 application  1 application Topical QID Mansy, Jan A, MD      . pantoprazole (PROTONIX) EC tablet 20 mg  20 mg Oral Daily Mansy, Jan A, MD   20 mg at 02/20/20 0945  . sodium chloride flush (NS) 0.9 % injection 3 mL  3 mL Intravenous Q12H Mansy, Jan A, MD   3 mL at 02/21/20 1244  . sodium chloride flush (NS) 0.9 % injection 3 mL  3 mL Intravenous PRN Mansy, Jan A, MD      . vancomycin (VANCOCIN) IVPB 1000 mg/200 mL premix  1,000 mg Intravenous Q24H Lang Snow, NP       Facility-Administered Medications Ordered in Other Encounters  Medication Dose Route Frequency Provider Last Rate Last Admin  . heparin lock flush 100 unit/mL  500 Units Intravenous Once Sindy Guadeloupe, MD      . sodium chloride flush (NS) 0.9 % injection 10 mL  10 mL Intravenous PRN Sindy Guadeloupe, MD   10 mL at 12/07/19 0914  . sodium chloride flush (NS) 0.9 % injection 10 mL  10 mL Intravenous PRN Sindy Guadeloupe, MD   10 mL at 01/04/20 0850     VITAL SIGNS: BP 128/70 (BP Location: Left Arm)   Pulse 92   Temp 98 F (36.7 C) (Oral)   Resp 19   Ht '5\' 1"'  (1.549 m)   Wt 158 lb 11.2 oz (72 kg)   SpO2 96%   BMI 29.99 kg/m  [facility] Weights   02/19/20 2119 02/19/20 2301  Weight: 156 lb (70.8 kg) 158 lb 11.2 oz (72 kg)    Estimated body mass index is 29.99 kg/m as calculated from the following:   Height as of this encounter: '5\' 1"'  (1.549 m).   Weight as of this encounter: 158 lb 11.2 oz (72 kg).  LABS: CBC:    Component Value Date/Time   WBC 7.5 02/21/2020 0427   HGB 9.3 (L) 02/21/2020 0427   HGB 13.4 03/27/2014 1214   HCT 29.1 (L) 02/21/2020 0427   HCT 41.8 03/27/2014 1214   PLT 92 (L) 02/21/2020 0427   PLT 220 03/27/2014 1214   MCV 99.0 02/21/2020 0427   MCV 92 03/27/2014 1214   NEUTROABS 6.6 02/21/2020 0427   NEUTROABS 3,274 02/15/2019 0000   NEUTROABS 4.9 03/27/2014 1214   LYMPHSABS 0.5 (L) 02/21/2020 0427   LYMPHSABS 1.4 03/27/2014 1214   MONOABS 0.4 02/21/2020 0427   MONOABS 0.6 03/27/2014 1214   EOSABS 0.0 02/21/2020 0427   EOSABS 0.1 03/27/2014 1214   BASOSABS 0.0 02/21/2020 0427   BASOSABS 0.1 03/27/2014 1214   Comprehensive Metabolic Panel:    Component Value Date/Time   NA 138 02/21/2020 0427   NA 140 02/15/2019 0000   NA 142 03/27/2014 1214   K 4.1 02/21/2020 0427   K 4.6 03/27/2014 1214   CL 101 02/21/2020 0427   CL 107 03/27/2014 1214   CO2 28 02/21/2020 0427   CO2 33 (H) 03/27/2014 1214   BUN  32 (H) 02/21/2020 0427   BUN 19 02/15/2019 0000   BUN 14 03/27/2014 1214   CREATININE 1.34 (H) 02/21/2020 0427   CREATININE 1.04 03/27/2014 1214   GLUCOSE 142 (H) 02/21/2020 0427   GLUCOSE 77 03/27/2014 1214   CALCIUM 8.2 (L) 02/21/2020 0427   CALCIUM 8.7 03/27/2014 1214   AST 31 02/19/2020 2125   AST 17 03/27/2014 1214   ALT 18 02/19/2020 2125   ALT 22 03/27/2014 1214   ALKPHOS 63 02/19/2020 2125   ALKPHOS 81 03/27/2014 1214   BILITOT 2.3 (H) 02/19/2020 2125   BILITOT 1.1 (H) 03/27/2014  1214   PROT 6.4 (L) 02/19/2020 2125   PROT 6.9 03/27/2014 1214   ALBUMIN 3.6 02/19/2020 2125   ALBUMIN 3.6 03/27/2014 1214    RADIOGRAPHIC STUDIES: DG Chest 2 View  Result Date: 02/13/2020 CLINICAL DATA:  Shortness of breath. On chemotherapy for endometrial cancer. EXAM: CHEST - 2 VIEW COMPARISON:  12/29/2019 FINDINGS: Midline trachea. Normal heart size. Right sided central line terminates at the caval atrial junction. Paramediastinal radiation fibrosis with architectural distortion, similar. No pleural effusion or pneumothorax. Mild right hemidiaphragm elevation. Basilar predominant interstitial thickening, likely related to COPD/chronic bronchitis. No lobar consolidation. Right upper quadrant surgical clips. IMPRESSION: No acute cardiopulmonary disease. Electronically Signed   By: Abigail Miyamoto M.D.   On: 02/13/2020 17:29   CT Angio Chest PE W and/or Wo Contrast  Result Date: 02/14/2020 CLINICAL DATA:  High probability for pulmonary embolism. Shortness of breath. On chemotherapy for endometrial cancer. EXAM: CT ANGIOGRAPHY CHEST WITH CONTRAST TECHNIQUE: Multidetector CT imaging of the chest was performed using the standard protocol during bolus administration of intravenous contrast. Multiplanar CT image reconstructions and MIPs were obtained to evaluate the vascular anatomy. CONTRAST:  26m OMNIPAQUE IOHEXOL 350 MG/ML SOLN COMPARISON:  01/16/2020 chest CT FINDINGS: Cardiovascular: Cardiomegaly, most notably left heart dilatation. Atherosclerotic calcification and LAD stenting. Branching, central pulmonary artery filling defects in lateral and posterior segments of the right lower lobe. Porta catheter on the right. Mediastinum/Nodes: No adenopathy Lungs/Pleura: Bilateral perihilar radiation fibrosis. Mild ground-glass density asymmetric to the right base. Upper Abdomen: No acute finding.  Cholecystectomy. Musculoskeletal: No acute or aggressive finding. Review of the MIP images confirms the above  findings. Critical Value/emergent results were called by telephone at the time of interpretation on 02/14/2020 at 6:28 am [facility] verbally acknowledged these results. IMPRESSION: 1. Acute segmental emboli to the right lower lobe. 2. Mild ground-glass opacity in the right lower lobe, unlikely to be ischemic given the nonocclusive clot and patchy pattern. Please correlate for infectious symptoms. Electronically Signed   By: JMonte FantasiaM.D.   On: 02/14/2020 06:30   UKoreaVenous Img Lower Bilateral (DVT)  Result Date: 02/20/2020 CLINICAL DATA:  PE EXAM: BILATERAL LOWER EXTREMITY VENOUS DOPPLER ULTRASOUND TECHNIQUE: Gray-scale sonography with compression, as well as color and duplex ultrasound, were performed to evaluate the deep venous system(s) from the level of the common femoral vein through the popliteal and proximal calf veins. COMPARISON:  None. FINDINGS: VENOUS Normal compressibility of the common femoral, superficial femoral, and popliteal veins, as well as the visualized calf veins (peroneal vein not well visualized on the left). Visualized portions of profunda femoral vein and great saphenous vein unremarkable. No filling defects to suggest DVT on grayscale or color Doppler imaging. Doppler waveforms show normal direction of venous flow, normal respiratory plasticity and response to augmentation. OTHER None. Limitations: none IMPRESSION: No evidence of DVT in the lower  extremities. Electronically Signed   By: Macy Mis M.D.   On: 02/20/2020 12:08   DG Chest Portable 1 View  Result Date: 02/19/2020 CLINICAL DATA:  Pt arrives via [facility] from home with complaint of SOB. Pt low 70's% on RA, pt with blue lips. EMS administered 1 duoneb and placed on 8L NRB, spo2 100%. Pt tachy in the low 100's for HR EXAM: PORTABLE CHEST 1 VIEW COMPARISON:  02/13/2020 FINDINGS: Hazy opacities are noted in both lungs. Scarring is noted extending from the hila into the medial upper lobes. No  convincing pleural effusion and no pneumothorax. Right internal jugular central venous catheter is stable, tip in the lower superior vena cava. Cardiac silhouette is mildly enlarged. No mediastinal or hilar masses. No evidence of adenopathy. Skeletal structures are grossly intact. IMPRESSION: 1. Hazy opacity is noted in lungs, representing a change from the most recent prior chest radiograph. Findings may be due to multifocal pneumonia or pulmonary edema. Electronically Signed   By: Lajean Manes M.D.   On: 02/19/2020 21:41    PERFORMANCE STATUS (ECOG) : 2 - Symptomatic, <50% confined to bed  Review of Systems Unless otherwise noted, a complete review of systems is negative.  Physical Exam General: NAD, thin, frail appearing Pulmonary: unlabored, on O2 Extremities: no edema, no joint deformities Skin: no rashes Neurological: Weakness but otherwise nonfocal  IMPRESSION: Patient remains in the ER.  Overall, patient reports feeling improved today.  She has less shortness of breath and no hemoptysis yet today.  She remains on 6 L O2 but current SPO2 is 100% and discussed weaning O2 as tolerated with nursing staff.  Patient met yesterday with Dr. Janese Banks and change CODE STATUS to DNR/DNI.  Today, patient verbalized feeling like her prognosis was likely weeks to months.  She seems to recognize that options for cancer treatment might be limited by her frailty and comorbidities.  She says someone mentioned the option of a hospice facility and we discussed that option in comparison to hospice at home.  Unfortunately, patient has limited social support at home.  Her goals are aligned with continued treatment in the hospital and it is reasonable to give her time to see how she responds.  PLAN: -Continue current scope of treatment -We will follow  Discussed with Dr. Janese Banks   Time Total: 20 minutes  Visit consisted of counseling and education dealing with the complex and emotionally intense issues of  symptom management and palliative care in the setting of serious and potentially life-threatening illness.Greater than 50%  of this time was spent counseling and coordinating care related to the above assessment and plan.  Signed by: Altha Harm, PhD, NP-C

## 2020-02-21 NOTE — Progress Notes (Signed)
[facility] [place] NAME: Jaime Hall    MR#:  [id number]  DATE OF BIRTH:  1951/04/28  SUBJECTIVE:    REVIEW OF SYSTEMS:   ROS Tolerating Diet: Tolerating PT:   DRUG ALLERGIES:   Allergies  Allergen Reactions  . Adhesive [Tape] Other (See Comments)    Skin Irritation   . Antifungal [Miconazole Nitrate] Rash  . Sulfa Antibiotics Nausea And Vomiting and Rash  . Z-Pak [Azithromycin] Itching    VITALS:  Blood pressure 128/70, pulse 92, temperature 98 F (36.7 C), temperature source Oral, resp. rate 19, height 5\' 2"  (1.575 m), weight 71.9 kg, SpO2 96 %.  PHYSICAL EXAMINATION:   Physical Exam  GENERAL:  69 y.o.-year-old patient lying in the bed with no acute distress.  EYES: Pupils equal, round, reactive to light and accommodation. No scleral icterus.   HEENT: Head atraumatic, normocephalic. Oropharynx and nasopharynx clear.  NECK:  Supple, no jugular venous distention. No thyroid enlargement, no tenderness.  LUNGS: Normal breath sounds bilaterally, no wheezing, rales, rhonchi. No use of accessory muscles of respiration.  CARDIOVASCULAR: S1, S2 normal. No murmurs, rubs, or gallops.  ABDOMEN: Soft, nontender, nondistended. Bowel sounds present. No organomegaly or mass.  EXTREMITIES: No cyanosis, clubbing or edema b/l.    NEUROLOGIC: Cranial nerves II through XII are intact. No focal Motor or sensory deficits b/l.   PSYCHIATRIC:  patient is alert and oriented x 3.  SKIN: No obvious rash, lesion, or ulcer.   LABORATORY PANEL:  CBC Recent Labs  Lab 02/21/20 0427  WBC 7.5  HGB 9.3*  HCT 29.1*  PLT 92*    Chemistries  Recent Labs  Lab 02/19/20 2125 02/20/20 0411 02/21/20 0427  NA 142   < > 138  K 3.3*   < > 4.1  CL 104   < > 101  CO2 25   < > 28  GLUCOSE 118*   < > 142*  BUN 26*   < > 32*  CREATININE 1.43*   < > 1.34*  CALCIUM 8.7*   < > 8.2*  MG  --    < > 2.1  AST 31  --   --   ALT 18  --   --   ALKPHOS  63  --   --   BILITOT 2.3*  --   --    < > = values in this interval not displayed.   Cardiac Enzymes No results for input(s): TROPONINI in the last 168 hours. RADIOLOGY:  US Venous Img Lower Bilateral (DVT)  Result Date: 02/20/2020 CLINICAL DATA:  PE EXAM: BILATERAL LOWER EXTREMITY VENOUS DOPPLER ULTRASOUND TECHNIQUE: Gray-scale sonography with compression, as well as color and duplex ultrasound, were performed to evaluate the deep venous system(s) from the level of the common femoral vein through the popliteal and proximal calf veins. COMPARISON:  None. FINDINGS: VENOUS Normal compressibility of the common femoral, superficial femoral, and popliteal veins, as well as the visualized calf veins (peroneal vein not well visualized on the left). Visualized portions of profunda femoral vein and great saphenous vein unremarkable. No filling defects to suggest DVT on grayscale or color Doppler imaging. Doppler waveforms show normal direction of venous flow, normal respiratory plasticity and response to augmentation. OTHER None. Limitations: none IMPRESSION: No evidence of DVT in the lower extremities. Electronically Signed   By: Macy Mis M.D.   On: 02/20/2020 12:08   DG Chest Portable 1 View  Result Date: 02/19/2020 CLINICAL DATA:  Pt arrives via [facility] from home with complaint of SOB. Pt low 70's% on RA, pt with blue lips. EMS administered 1 duoneb and placed on 8L NRB, spo2 100%. Pt tachy in the low 100's for HR EXAM: PORTABLE CHEST 1 VIEW COMPARISON:  02/13/2020 FINDINGS: Hazy opacities are noted in both lungs. Scarring is noted extending from the hila into the medial upper lobes. No convincing pleural effusion and no pneumothorax. Right internal jugular central venous catheter is stable, tip in the lower superior vena cava. Cardiac silhouette is mildly enlarged. No mediastinal or hilar masses. No evidence of adenopathy. Skeletal structures are grossly intact. IMPRESSION: 1. Hazy opacity is noted in  lungs, representing a change from the most recent prior chest radiograph. Findings may be due to multifocal pneumonia or pulmonary edema. Electronically Signed   By: Lajean Manes M.D.   On: 02/19/2020 21:41   ASSESSMENT AND PLAN:   [name] a69 y.o.Caucasian femalewith a known history of CHF, chronic kidney disease, coronary artery disease, GERD and Hodgkin's lymphoma, who presented to the emergency room with acute onset of generalized weakness with worsening dyspnea and associated cough occasionally productive of blood-tinged sputum and two-pillow orthopnea with worsening lower extremity edema.  #1 acute respiratory distress secondary to acute on chronic combined systolic and diastolic heart failure and hemoptysis -Patient presented with hemoptysis, lower extremity edema, two-pillow orthopnea, worsening shortness of breath, recently diagnosed with acute PE and placed on Eliquis.  -  Patient with recent 2D echo 12/29/2019 with a EF of 30 to 35% and grade 1 diastolic dysfunction, mild mitral regurgitation, aortic regurgitation.  - on Lasix 40 mg IV every 12 hours -- to oral Lasix 40 mg by cardiology  -  continue Lipitor, Coreg, Cozaar.  Strict I's and O's.  Daily weights. -  Cardiology consulted appreciate input -antibiotics discontinued. Does not clinically seem to be pneumonia or looks septic -wean oxygen down. Patient does not use oxygen at home  2.  Hemoptysis -Patient with significant hemoptysis on presentation and on evaluation in the ED.  -Patient recently diagnosed with acute PE per CT angiogram of 02/14/2020.  -Patient was discharged on Eliquis.   -checked lower extremity Dopplers and Negative for DVT  - Discontinue Eliquis, DC aspirin.   Supportive care.   -hemoptysis resolved -will discussed with patient if she is interested in getting IVC filter placed.  3.  Hypertension -Continue diuretics, Coreg, Cozaar.   4.  Hypothyroidism -TSH noted at 20.26.  Patient states  that she is compliant with her Synthroid. - Increase Synthroid to 62.5 MCG's daily.   -Will need repeat thyroid function studies done in 4 to 6 weeks.  5.  Recent acute pulmonary emboli -Patient presenting with hemoptysis.  -will likely need evaluation by vascular surgery for IVC filter placement.  Supportive care.  6.  Coronary artery disease Continue statin, beta-blocker.  Aspirin discontinued due to hemoptysis.  Cardiology following.  7.  Hyperlipidemia Statin.  8.  Gastroesophageal reflux disease PPI.  9.  History of recurrent high-grade serous endometrial cancer Outpatient follow-up with oncology. Hold oral lenvatinib for now per Dr Vicente Serene seen by palliative care. Patient has DNR. Hospice option was discussed with patient. She has not made any decision at present   DVT prophylaxis: SCDs. Code Status: Full Family Communication: Updated patient.  son at bedside. Disposition: home  Status is: Inpatient    Dispo: The patient is from: Home  Anticipated d/c is to: To be determined  Anticipated d/c date is: To be determined  Patient currently in acute respiratory distress secondary to acute CHF exacerbation and also patient with significant hemoptysis.  Not stable for discharge.          TOTAL TIME TAKING CARE OF THIS PATIENT: *25* minutes.  >50% time spent on counselling and coordination of care  Note: This dictation was prepared with Dragon dictation along with smaller phrase technology. Any transcriptional errors that result from this process are unintentional.  Fritzi Mandes M.D    [facility]   CC: Primary care physician; [name], MDPatient ID: Jaime Hall, female   DOB: 28-Jul-1950, 69 y.o.   MRN: [id number]

## 2020-02-21 NOTE — Evaluation (Signed)
Physical Therapy Evaluation Patient Details Name: CORLEY KOHLS MRN: [id number] DOB: 10/31/1950 Today's Date: 02/21/2020   History of Present Illness  Georgean Spainhower is a 69 y/o female admitted 9/6 for acute respiratory distress. Chief complaints include acute onset generalized weakness with worsening dyspnea and associated cough with occasional productive of blood-tinged sputum and worsening leg edema. Pt with recent hospitalization 9/1-9/2 with PE and acute hypoxic respiratory distress. PMH includes CHF, CKD, CAD, GERD, Hodgkin's lymphoma, anxiety, cervicalgia, essential HTN, hs of anemia, hs of blood transfusion, and OA.  Clinical Impression  Pt lying in bed on 3L O2 via nasal cannula. RN Amy present in room upon arrival and reports that she just decreased pt to 3L from 5L and states that pt not on O2 at home. Pt performed bed mobility with supervision for safety with O2 line and required no physical assistance. Pt performed sit <> stand with CGA for steadying as pt initially with posterior lean. Pt had incontinent episode of urine and pt and floor cleaned. Pt ambulated initially with RW and then attempted without AD requiring CGA for steadying. Pt desat to 84% and required verbal cues for pursed lip breathing. Pt able to recover to 91% within 2 minutes. Pt performed 5TSTS in 14.7 sec. Pt presents with generalized weakness, decreased balance, endurance, and functional mobility. Recommend skilled PT to address current deficits and HHPT at discharge to optimize return to PLOF and maximize functional mobility. Pt may benefit from RW however will continue to assess balance to determine necessity.    Follow Up Recommendations Home health PT    Equipment Recommendations  Rolling walker with 5" wheels (possibly RW if pt balance does not improve)    Recommendations for Other Services       Precautions / Restrictions Precautions Precautions: Fall Restrictions Weight Bearing Restrictions: No       Mobility  Bed Mobility Overal bed mobility: Needs Assistance Bed Mobility: Supine to Sit;Sit to Supine     Supine to sit: Supervision Sit to supine: Supervision   General bed mobility comments: Supervision for safety with O2 line management. Required no external phsyical assistance.  Transfers Overall transfer level: Needs assistance Equipment used: None Transfers: Sit to/from Stand Sit to Stand: Min guard         General transfer comment: CGA for sit <> stand transfer for steadying as pt with initially posterior lean.   Ambulation/Gait Ambulation/Gait assistance: Min guard Gait Distance (Feet): 260 Feet Assistive device: Rolling walker (2 wheeled);None Gait Pattern/deviations: Step-through pattern;Drifts right/left;Staggering right;Staggering left Gait velocity: 10' in 8"   General Gait Details: Pt initially ambulating using RW with CGA for steadying as pt noted to drift to L and R. As distance progressed, pt fluidity of gait improved and progressed to SBA for safety. Pt then attempted ambulation without AD and noted to stagger to L and R. Pt desat to 84% on 3L O2 via nasal cannula. Verbal cues for pursed lip breathing.  Stairs            Wheelchair Mobility    Modified Rankin (Stroke Patients Only)       Balance Overall balance assessment: Needs assistance Sitting-balance support: Feet supported Sitting balance-Leahy Scale: Good Sitting balance - Comments: no overt LOB while sitting with unsupported back   Standing balance support: Bilateral upper extremity supported;No upper extremity supported Standing balance-Leahy Scale: Good Standing balance comment: fair to good standing dynamic balance with CGA for steadying  Pertinent Vitals/Pain Pain Assessment: No/denies pain    Home Living Family/patient expects to be discharged to:: Private residence Living Arrangements: Alone Available Help at Discharge:  Family;Available PRN/intermittently Type of Home: House Home Access: Stairs to enter Entrance Stairs-Rails: Right Entrance Stairs-Number of Steps: 2 Home Layout: One level Home Equipment: None      Prior Function Level of Independence: Independent         Comments: Pt reports independence with ambulationa nd transfers. Pt denies fall history in last 6 months, endorses working part time 4-6 hrs/day, and denies home O2 usage.     Hand Dominance        Extremity/Trunk Assessment   Upper Extremity Assessment Upper Extremity Assessment: Generalized weakness (grossly 4- to 4/5 bilaterally)    Lower Extremity Assessment Lower Extremity Assessment: Generalized weakness (grossly 4- to 4/5 bilaterally)       Communication   Communication: No difficulties  Cognition Arousal/Alertness: Awake/alert Behavior During Therapy: WFL for tasks assessed/performed Overall Cognitive Status: Within Functional Limits for tasks assessed                                 General Comments: Pt A&O x 4.      General Comments      Exercises Other Exercises Other Exercises: once pt stood, pt incontinent of urine and urinated a large amount on floor and required pt and floor cleanup Other Exercises: pt performed static standing balance activity for patient care and donning of chuck pad to act as a brief for ambulation Other Exercises: 5x sit <> stand in 14.7 seconds; pt them performed another 5 reps of sit <> stands with CGA for steadying   Assessment/Plan    PT Assessment Patient needs continued PT services  PT Problem List Decreased strength;Decreased activity tolerance;Decreased balance;Decreased mobility;Decreased safety awareness;Cardiopulmonary status limiting activity       PT Treatment Interventions DME instruction;Gait training;Stair training;Functional mobility training;Therapeutic activities;Therapeutic exercise;Balance training;Patient/family education    PT Goals  (Current goals can be found in the Care Plan section)  Acute Rehab PT Goals Patient Stated Goal: to go home PT Goal Formulation: With patient Time For Goal Achievement: 03/06/20 Potential to Achieve Goals: Good Additional Goals Additional Goal #1: Pt will perform sit <> stand transfers from bed/chair level independently, with LRAD. (to return to PLOF)    Frequency Min 2X/week   Barriers to discharge        Co-evaluation               AM-PAC PT "6 Clicks" Mobility  Outcome Measure Help needed turning from your back to your side while in a flat bed without using bedrails?: None Help needed moving from lying on your back to sitting on the side of a flat bed without using bedrails?: A Little Help needed moving to and from a bed to a chair (including a wheelchair)?: A Little Help needed standing up from a chair using your arms (e.g., wheelchair or bedside chair)?: A Little Help needed to walk in hospital room?: A Little Help needed climbing 3-5 steps with a railing? : A Little 6 Click Score: 19    End of Session Equipment Utilized During Treatment: Gait belt;Oxygen;Other (comment) (3L via nasal cannula) Activity Tolerance: Patient limited by fatigue;Patient tolerated treatment well Patient left: in bed;with call bell/phone within reach;with bed alarm set Nurse Communication: Mobility status PT Visit Diagnosis: Unsteadiness on feet (R26.81);Muscle weakness (generalized) (M62.81);Other abnormalities of  gait and mobility (R26.89)    Time: 1003-4961 PT Time Calculation (min) (ACUTE ONLY): 43 min   Charges:              Vale Haven, SPT  Vale Haven 02/21/2020, 4:57 PM

## 2020-02-22 ENCOUNTER — Inpatient Hospital Stay: Payer: PPO

## 2020-02-22 ENCOUNTER — Inpatient Hospital Stay: Payer: PPO | Admitting: Oncology

## 2020-02-22 ENCOUNTER — Ambulatory Visit: Payer: PPO | Admitting: Cardiovascular Disease

## 2020-02-22 ENCOUNTER — Telehealth: Payer: Self-pay | Admitting: Oncology

## 2020-02-22 DIAGNOSIS — R042 Hemoptysis: Secondary | ICD-10-CM

## 2020-02-22 LAB — BASIC METABOLIC PANEL
Anion gap: 12 (ref 5–15)
BUN: 32 mg/dL — ABNORMAL HIGH (ref 8–23)
CO2: 28 mmol/L (ref 22–32)
Calcium: 8.5 mg/dL — ABNORMAL LOW (ref 8.9–10.3)
Chloride: 101 mmol/L (ref 98–111)
Creatinine, Ser: 1.17 mg/dL — ABNORMAL HIGH (ref 0.44–1.00)
GFR calc Af Amer: 55 mL/min — ABNORMAL LOW (ref >60)
GFR calc non Af Amer: 47 mL/min — ABNORMAL LOW (ref >60)
Glucose, Bld: 150 mg/dL — ABNORMAL HIGH (ref 70–99)
Potassium: 3.4 mmol/L — ABNORMAL LOW (ref 3.5–5.1)
Sodium: 141 mmol/L (ref 135–145)

## 2020-02-22 LAB — CBC WITH DIFFERENTIAL/PLATELET
Abs Immature Granulocytes: 0.1 10*3/uL — ABNORMAL HIGH (ref 0.00–0.07)
Basophils Absolute: 0 10*3/uL (ref 0.0–0.1)
Basophils Relative: 0 %
Eosinophils Absolute: 0 10*3/uL (ref 0.0–0.5)
Eosinophils Relative: 0 %
HCT: 27 % — ABNORMAL LOW (ref 36.0–46.0)
Hemoglobin: 8.7 g/dL — ABNORMAL LOW (ref 12.0–15.0)
Immature Granulocytes: 1 %
Lymphocytes Relative: 4 %
Lymphs Abs: 0.4 10*3/uL — ABNORMAL LOW (ref 0.7–4.0)
MCH: 31.4 pg (ref 26.0–34.0)
MCHC: 32.2 g/dL (ref 30.0–36.0)
MCV: 97.5 fL (ref 80.0–100.0)
Monocytes Absolute: 0.3 10*3/uL (ref 0.1–1.0)
Monocytes Relative: 3 %
Neutro Abs: 9.8 10*3/uL — ABNORMAL HIGH (ref 1.7–7.7)
Neutrophils Relative %: 92 %
Platelets: 120 10*3/uL — ABNORMAL LOW (ref 150–400)
RBC: 2.77 MIL/uL — ABNORMAL LOW (ref 3.87–5.11)
RDW: 17.6 % — ABNORMAL HIGH (ref 11.5–15.5)
WBC: 10.6 10*3/uL — ABNORMAL HIGH (ref 4.0–10.5)
nRBC: 0 % (ref 0.0–0.2)

## 2020-02-22 LAB — CULTURE, RESPIRATORY W GRAM STAIN
Culture: NORMAL
Gram Stain: NONE SEEN

## 2020-02-22 LAB — CULTURE, BLOOD (SINGLE): Special Requests: ADEQUATE

## 2020-02-22 LAB — URINE CULTURE: Culture: >=100000 — AB

## 2020-02-22 LAB — PROCALCITONIN: Procalcitonin: <0.1 ng/mL

## 2020-02-22 LAB — PROTIME-INR
INR: 1.4 — ABNORMAL HIGH (ref 0.8–1.2)
Prothrombin Time: 16.3 seconds — ABNORMAL HIGH (ref 11.4–15.2)

## 2020-02-22 MED ORDER — POTASSIUM CHLORIDE CRYS ER 20 MEQ PO TBCR
40.0000 meq | EXTENDED_RELEASE_TABLET | Freq: Once | ORAL | Status: AC
  Administered 2020-02-22: 40 meq via ORAL
  Filled 2020-02-22: qty 2

## 2020-02-22 MED ORDER — IPRATROPIUM-ALBUTEROL 0.5-2.5 (3) MG/3ML IN SOLN
3.0000 mL | Freq: Four times a day (QID) | RESPIRATORY_TRACT | Status: DC
  Administered 2020-02-22: 3 mL via RESPIRATORY_TRACT
  Filled 2020-02-22: qty 3

## 2020-02-22 MED ORDER — LEVOTHYROXINE SODIUM 75 MCG PO TABS: 75.0000 ug | ORAL_TABLET | Freq: Every day | ORAL | 0 refills | Status: DC

## 2020-02-22 MED ORDER — CHLORHEXIDINE GLUCONATE CLOTH 2 % EX PADS
6.0000 | MEDICATED_PAD | Freq: Every day | CUTANEOUS | Status: DC
  Administered 2020-02-22: 6 via TOPICAL

## 2020-02-22 MED ORDER — IPRATROPIUM-ALBUTEROL 0.5-2.5 (3) MG/3ML IN SOLN: 3.0000 mL | Freq: Two times a day (BID) | RESPIRATORY_TRACT | Status: DC

## 2020-02-22 MED ORDER — GUAIFENESIN ER 600 MG PO TB12: 600.0000 mg | ORAL_TABLET | Freq: Two times a day (BID) | ORAL | 0 refills | Status: AC

## 2020-02-22 MED ORDER — HEPARIN SOD (PORK) LOCK FLUSH 100 UNIT/ML IV SOLN
500.0000 [IU] | Freq: Once | INTRAVENOUS | Status: AC
  Administered 2020-02-22: 500 [IU] via INTRAVENOUS
  Filled 2020-02-22: qty 5

## 2020-02-22 NOTE — Progress Notes (Signed)
Jaime Hall to be D/C'd Home with home health per MD order.  Discussed prescriptions and follow up appointments with the patient and son. Prescriptions given to patient, medication list explained in detail. Pt verbalized understanding.  Allergies as of 02/22/2020      Reactions   Adhesive [tape] Other (See Comments)   Skin Irritation   Antifungal [miconazole Nitrate] Rash   Sulfa Antibiotics Nausea And Vomiting, Rash   Z-pak [azithromycin] Itching      Medication List    STOP taking these medications   apixaban 5 MG Tabs tablet Commonly known as: ELIQUIS   aspirin EC 81 MG tablet   Lenvima (10 MG Daily Dose) capsule Generic drug: lenvatinib 10 mg daily dose   predniSONE 10 MG (21) Tbpk tablet Commonly known as: STERAPRED UNI-PAK 21 TAB     TAKE these medications   atorvastatin 10 MG tablet Commonly known as: LIPITOR Take 1 tablet by mouth once daily What changed: when to take this   carvedilol 3.125 MG tablet Commonly known as: Coreg Take 1 tablet (3.125 mg total) by mouth 2 (two) times daily with a meal.   chlorpheniramine-HYDROcodone 10-8 MG/5ML Suer Commonly known as: Tussionex Pennkinetic ER Take 5 mLs by mouth every 12 (twelve) hours as needed for cough.   cholecalciferol 25 MCG (1000 UNIT) tablet Commonly known as: VITAMIN D3 Take 1,000 Units by mouth in the morning and at bedtime.   cyanocobalamin 1000 MCG/ML injection Commonly known as: (VITAMIN B-12) INJECT 1ML IM WEEKLY FOR 4 WEEKS, THEN INJECT MONTHLY THEREAFTER What changed:   how much to take  how to take this  when to take this  additional instructions   DULoxetine 30 MG capsule Commonly known as: CYMBALTA Take 1 capsule (30 mg total) by mouth daily.   furosemide 20 MG tablet Commonly known as: Lasix Take 1 tablet (20 mg total) by mouth as needed for fluid or edema (Take one tablet daily as needed for lower extremity edema or for weight gain of 2 lbs overnight or 5 lbs in one  week.). What changed: when to take this   guaiFENesin 600 MG 12 hr tablet Commonly known as: Mucinex Take 1 tablet (600 mg total) by mouth 2 (two) times daily for 5 days.   levothyroxine 75 MCG tablet Commonly known as: Synthroid Take 1 tablet (75 mcg total) by mouth daily before breakfast. What changed:   medication strength  how much to take   LORazepam 0.5 MG tablet Commonly known as: ATIVAN Take 1 tablet (0.5 mg total) by mouth every 6 (six) hours as needed for anxiety (and to help with breathing).   losartan 25 MG tablet Commonly known as: COZAAR Take 0.5 tablets (12.5 mg total) by mouth daily.   pantoprazole 20 MG tablet Commonly known as: Protonix Take 1 tablet (20 mg total) by mouth daily.            Durable Medical Equipment  (From admission, onward)         Start     Ordered   02/22/20 1534  For home use only DME Walker rolling  Once       Question Answer Comment  Walker: With 5 Inch Wheels   Patient needs a walker to treat with the following condition Acute CHF (congestive heart failure) ([place])      02/22/20 1534   02/22/20 1458  For home use only DME oxygen  Once       Question Answer Comment  Length of  Need 6 Months   Mode or (Route) Nasal cannula   Liters per Minute 2   Frequency Continuous (stationary and portable oxygen unit needed)   Oxygen conserving device Yes   Oxygen delivery system Gas      02/22/20 1458          Vitals:   02/22/20 1121 02/22/20 1557  BP: (!) 106/58 112/60  Pulse: 89 82  Resp: 20 20  Temp: 98.4 F (36.9 C) 98.1 F (36.7 C)  SpO2: 94% 98%    Tele box removed and returned. Skin clean, dry and intact without evidence of skin break down, no evidence of skin tears noted. IV catheter discontinued intact. Site without signs and symptoms of complications. Dressing and pressure applied. Pt denies pain at this time. No complaints noted.  An After Visit Summary was printed and given to the patient. Patient escorted  via [place], and D/C home via private auto.  Rolley Sims

## 2020-02-22 NOTE — TOC Initial Note (Signed)
Transition of Care [facility]) - Initial/Assessment Note    Patient Details  Name: Jaime Hall MRN: [id number] Date of Birth: [birth year]  Transition of Care [facility]) CM/SW Contact:    Eileen Stanford, LCSW Phone Number: 02/22/2020, 3:31 PM  Clinical Narrative:   Pt is agreeable to [facility]. Pt was set up with [facility] prior admission however no order were ever placed. [facility] will service pt again. Orders placed. Pt will need 02 and a rolling walker. Both ordered through [facility].                Expected Discharge Plan: [place] Barriers to Discharge: No Barriers Identified   Patient Goals and CMS Choice     Choice offered to / list presented to : Patient  Expected Discharge Plan and Services Expected Discharge Plan: [place] In-house Referral: Clinical Social Work   Post Acute Care Choice: [place] arrangements for the past 2 months: [place] Expected Discharge Date: 02/22/20               DME Arranged: Danny Lawless DME Agency: [facility] Date DME Agency Contacted: 02/22/20 Time DME Agency Contacted: [phone] Representative spoke with at DME Agency: zach HH Arranged: PT, RN [place] Agency: [place] ([place]) Date [place]: 02/22/20 Time [place]: 59 Representative spoke with at [place]: [place] Arrangements/Services Living arrangements for the past 2 months: [place] with:: Self Patient language and need for interpreter reviewed:: Yes Do you feel safe going back to the place where you live?: Yes      Need for Family Participation in Patient Care: Yes (Comment) Care giver support system in place?: Yes (comment)   Criminal Activity/Legal Involvement Pertinent to Current Situation/Hospitalization: No - Comment as needed  Activities of Daily Living Home Assistive Devices/Equipment: Blood pressure cuff, Eyeglasses ADL Screening (condition at time of  admission) Patient's cognitive ability adequate to safely complete daily activities?: Yes Is the patient deaf or have difficulty hearing?: No Does the patient have difficulty seeing, even when wearing glasses/contacts?: No Does the patient have difficulty concentrating, remembering, or making decisions?: No Patient able to express need for assistance with ADLs?: Yes Does the patient have difficulty dressing or bathing?: No Independently performs ADLs?: Yes (appropriate for developmental age) Does the patient have difficulty walking or climbing stairs?: No Weakness of Legs: None Weakness of Arms/Hands: None  Permission Sought/Granted Permission sought to share information with : Family Supports    Share Information with NAME: Daren  Permission granted to share info w AGENCY: [facility]  Permission granted to share info w Relationship: son     Emotional Assessment Appearance:: Appears stated age Attitude/Demeanor/Rapport: Engaged Affect (typically observed): Accepting, Appropriate Orientation: : Oriented to Self, Oriented to Place, Oriented to  Time, Oriented to Situation Alcohol / Substance Use: Not Applicable Psych Involvement: No (comment)  Admission diagnosis:  Acute respiratory distress [R06.03] Pulmonary embolus (HCC) [I26.99] Acute CHF (congestive heart failure) (HCC) [I50.9] Acute respiratory failure with hypoxia (HCC) [J96.01] Acute on chronic congestive heart failure, unspecified heart failure type [facility]) [I50.9] Patient Active Problem List   Diagnosis Date Noted  . Hemoptysis 02/20/2020  . Acute respiratory distress 02/20/2020  . Acute on chronic congestive heart failure ([place])   . Acute respiratory failure with hypoxia ([place])   . Palliative care encounter   . Acute CHF (congestive heart failure) ([place]) 02/19/2020  . Acute pulmonary embolism ([place]) 02/14/2020  .  Acute on chronic HFrEF (heart failure with reduced ejection fraction) ([place]) 02/14/2020  . Thrombocytopenia  ([place]) 02/14/2020  . Hypothyroidism 02/14/2020  . Chest pain 12/29/2019  . Leukocytosis 12/29/2019  . HTN (hypertension) 12/29/2019  . HLD (hyperlipidemia) 12/29/2019  . Elevated troponin 12/29/2019  . CKD (chronic kidney disease), stage IIIa 12/29/2019  . Depression with anxiety 12/29/2019  . Abnormal cardiovascular stress test   . Dyspnea on exertion   . Goals of care, counseling/discussion 10/20/2019  . 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 Peritoneal metastases ([place]) 10/16/2019  . Flank pain 10/04/2019  . Right lower quadrant abdominal pain 10/04/2019  . Candidiasis 10/04/2019  . Right anterior knee pain 07/25/2019  . Educated about COVID-19 virus infection 11/15/2018  . Cramps, muscle, general 04/06/2018  . Chronic kidney disease, stage 2, mildly decreased GFR 10/05/2017  . B12 deficiency anemia 07/24/2017  . Normocytic anemia 07/18/2017  . Back pain 07/18/2017  . Chemotherapy-induced peripheral neuropathy ([place]) 12/10/2016  . Hyperlipidemia 08/13/2016  . Hx of colonic polyps 08/13/2016  . Benign neoplasm of sigmoid colon 08/13/2016  . Endometrial cancer ([place]) 08/13/2016  . 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 Lymphedema 07/20/2016  . Chronic venous insufficiency 07/20/2016  . PAD (peripheral artery disease) ([place]) 07/20/2016  . Postmenopausal bleeding 07/14/2016  . Class 1 obesity due to excess calories without serious comorbidity with body mass index (BMI) of 34.0 to 34.9 in adult 05/27/2016  . Leg cramps 05/27/2016  . Bronchiectasis ([place]) 12/19/2015  . Pre-syncope 12/19/2015  . Prolapsed, uterovaginal, incomplete 06/24/2014  . Routine general medical examination at a health care facility 12/30/2012  . Obesity (BMI 30-39.9) 12/30/2012  . Hx of multiple pulmonary nodules 12/28/2012  . Plantar fasciitis of right foot 12/28/2012  . 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 Hip pain, bilateral 09/12/2012  . History of  Hodgkin's lymphoma 09/12/2012  . Coronary artery disease   . Chest pain on exertion 02/29/2012   PCP:  [name], MD Pharmacy:   [facility] [address], [place] - [place] [address] [place] [address] Phone: [phone] Fax: [place], [place] - [address] [address] [place] [address] Phone: [phone] Fax: [phone]     Social Determinants of Health (SDOH) Interventions    Readmission Risk Interventions No flowsheet data found.

## 2020-02-22 NOTE — Progress Notes (Signed)
Heart Failure Nurse Navigator Note  Initial Visit with patient.  HFrEF-30-35%  She presented with worsening dyspnea and orthopnea.   Comorbidities:  CKD Coronary artery disease Hypertension Anemia Osteoarthritis    Medications:  Lipitor 10 mg daily Coreg 3.125 BID Lasix 40 mg daily  Labs:  Sodium 141, potassium 3.4, BUN 32, creatinine 1.17  Spoke with patient, she is feeling better and states she is going home. She continues to expectorate bloody sputum.    Offered HF teaching booklet and magnet.  She says she was given those by Otila Kluver NP in heart failure clinic.  She states she weighs herself daily and hope to get back into that routine.    Pricilla Riffle RN, CHFN

## 2020-02-22 NOTE — Care Management Important Message (Signed)
Important Message  Patient Details  Name: Jaime Hall MRN: [id number] Date of Birth: 1951/04/24   Medicare Important Message Given:  Yes     Dannette Barbara 02/22/2020, 2:53 PM

## 2020-02-22 NOTE — Telephone Encounter (Signed)
Current admit 02/22/20.

## 2020-02-22 NOTE — Discharge Instructions (Signed)
Use your oxygen as instructed. °

## 2020-02-22 NOTE — Progress Notes (Signed)
Physical Therapy Treatment Patient Details Name: Jaime Hall MRN: [id number] DOB: 09-19-1950 Today's Date: 02/22/2020    History of Present Illness Jaime Hall is a 69 y/o female admitted 9/6 for acute respiratory distress. Chief complaints include acute onset generalized weakness with worsening dyspnea and associated cough with occasional productive of blood-tinged sputum and worsening leg edema. Pt with recent hospitalization 9/1-9/2 with PE and acute hypoxic respiratory distress. PMH includes CHF, CKD, CAD, GERD, Hodgkin's lymphoma, anxiety, cervicalgia, essential HTN, hs of anemia, hs of blood transfusion, and OA.    PT Comments    OK'ed by RN for session.  Pt is able to get in and OOB without assist.  Ambulated to bathroom to void prior to gait around unit.  She does not use a RW today and overall does quite well.  She is able to maintain sats on 2 lpm >89% throughout session.  Some coughing noted with activity - non-productive.  She has some minor imbalances with gait but does not need any assist to correct.  She does not use a AD at home but agrees one would be beneficial especially for community ambulation.  Son in for session.   Follow Up Recommendations  Home health PT     Equipment Recommendations  Rolling walker with 5" wheels    Recommendations for Other Services       Precautions / Restrictions Precautions Precautions: Fall    Mobility  Bed Mobility Overal bed mobility: Modified Independent                Transfers Overall transfer level: Modified independent Equipment used: None Transfers: Sit to/from Stand Sit to Stand: Modified independent (Device/Increase time)            Ambulation/Gait Ambulation/Gait assistance: Min guard Gait Distance (Feet): 160 Feet Assistive device: None Gait Pattern/deviations: Step-through pattern     General Gait Details: elected not to use RW as at home.  Occasionally unsteady but overall does well and able  to dance a bit as she is walking out of room without LOB   Stairs             Wheelchair Mobility    Modified Rankin (Stroke Patients Only)       Balance Overall balance assessment: Needs assistance   Sitting balance-Leahy Scale: Good     Standing balance support: No upper extremity supported Standing balance-Leahy Scale: Good                              Cognition Arousal/Alertness: Awake/alert Behavior During Therapy: WFL for tasks assessed/performed Overall Cognitive Status: Within Functional Limits for tasks assessed                                 General Comments: Pt A&O x 4.      Exercises Other Exercises Other Exercises: to bathroom to void    General Comments        Pertinent Vitals/Pain Pain Assessment: No/denies pain    Home Living                      Prior Function            PT Goals (current goals can now be found in the care plan section) Progress towards PT goals: Progressing toward goals    Frequency    Min 2X/week  PT Plan Current plan remains appropriate    Co-evaluation              AM-PAC PT "6 Clicks" Mobility   Outcome Measure  Help needed turning from your back to your side while in a flat bed without using bedrails?: None   Help needed moving to and from a bed to a chair (including a wheelchair)?: None Help needed standing up from a chair using your arms (e.g., wheelchair or bedside chair)?: None Help needed to walk in hospital room?: A Little Help needed climbing 3-5 steps with a railing? : A Little 6 Click Score: 18    End of Session Equipment Utilized During Treatment: Gait belt;Oxygen Activity Tolerance: Patient tolerated treatment well Patient left: in bed;with call bell/phone within reach;with bed alarm set;with family/visitor present Nurse Communication: Mobility status       Time: 1245-1300 PT Time Calculation (min) (ACUTE ONLY): 15 min  Charges:   $Gait Training: 8-22 mins                    Chesley Noon, PTA 02/22/20, 1:08 PM

## 2020-02-22 NOTE — Progress Notes (Signed)
SATURATION QUALIFICATIONS: (This note is used to comply with regulatory documentation for home oxygen)  Patient Saturations on Room Air at Rest = 86%  Patient Saturations on Room Air while Ambulating = n/a%  Patient Saturations on 2 a Liters of oxygen while Ambulating = 92%  Please briefly explain why patient needs home oxygen:

## 2020-02-22 NOTE — Discharge Summary (Signed)
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 Anxiety   . Cervicalgia   . CHF (congestive heart failure) ([place])   . Chronic kidney disease   . Coronary artery disease    a. 02/2012 Stress echo: severe anterior wall ischemia;  b. 02/2012 Cath/PCI: LAD 95p (3.0 x 15 Xience EX DES), D1 90ost (PTCA - bifurcational dzs), EF 45% with anterior HK;  b. 02/2013 Ex MV: fixed anterior defect w/ minor reversibility, nl EF-->Med Rx.  . Endometrial cancer ([place])    a. 07/2016 s/p robotic hysterectomy, BSO w/ washings, sentinel node inj, mapping, bx, adhesiolysis.  . Essential hypertension, benign   . Fibrocystic breast disease   . GERD (gastroesophageal reflux disease)   . Gestational hypertension   . Heart murmur   . History of anemia   . History of blood transfusion   . Hodgkin's lymphoma ([place]) 2011   a. s/p radiation and chemo therapy  . Osteoarthritis   . Polycystic ovarian disease     HOSPITAL COURSE:   [name] a69 y.o.Caucasian femalewith a known history of CHF, chronic kidney disease, coronary artery disease, GERD and Hodgkin's lymphoma, who presented to the emergency room with acute onset of  generalized weakness with worsening dyspnea and associated cough occasionally productive of blood-tinged sputum and two-pillow orthopnea with worsening lower extremity edema.  #1 acute respiratory distress secondary to acute on chronic combinedsystolic and diastolic heart failure and hemoptysis -Patient presented with hemoptysis, lower extremity edema, two-pillow orthopnea, worsening shortness of breath, recently diagnosed with acute PE and placed on Eliquis.  - Patient with recent 2D echo 12/29/2019 with a EF of 30 to 35% and grade 1 diastolic dysfunction, mild mitral regurgitation, aortic regurgitation.  - on Lasix 40 mg IV every 12 hours -- to oral Lasix 40 mg by cardiology  -total urine output 4.9 L -patient qualifies for home oxygen - continue Lipitor, Coreg, Cozaar. Strict I's and O's. Daily weights. - Cardiology consulted  Dr. Rockey Situ appreciate input   2. Hemoptysis -Patient with significant hemoptysis on presentation and on evaluation in the ED. -Patient recently diagnosed with acute PE per CT angiogram of 02/14/2020. -Patient was discharged on Eliquis.  - lower extremity Dopplers and Negative for DVT  -Discontinue Eliquis, DC aspirin. Supportive care.  -hemoptysis resolved -Dr. Janese Banks will assess patient at the cancer center and if remains stable likely resume eliquis at half the dose.  3. Hypertension -Continue diuretics, Coreg, Cozaar.   4. Hypothyroidism -TSH noted at 20.26. Patient states that she is compliant with her Synthroid. -Increase Synthroid to 75 MCG's daily.  -Will need repeat thyroid function studies done in 4 to 6 weeks-- defer to PCP  5. Recent acute pulmonary emboli -Patient presenting with hemoptysis.  -Supportive care. -Hemoglobin stable -Dr. Janese Banks will assess  patient and likely resume eliquis at lower dose as outpatient.  6. Coronary artery disease Continue statin, beta-blocker. Aspirin discontinued due to hemoptysis. Cardiology  following.  7. Hyperlipidemia Statin.  8. Gastroesophageal reflux disease PPI.  9. History of recurrent high-grade serous endometrial cancer Outpatient follow-up with oncology. Hold oral lenvatinib for now per Dr Janese Banks seen by palliative care. Patient has DNR.  Overall patient has improved. Will discharge her to home with home health PT and RN.   DVT prophylaxis:SCDs. Code Status:Full Family Communication:Updated patient. son  on the phone Chimene Salo disposition:home    Dispo: The patient is from:Home Anticipated d/c is UT:MLYY with home health and oxygen Anticipated d/c date is: today   CONSULTS OBTAINED:  Treatment Team:  Nelva Bush, MD Sindy Guadeloupe, MD [facility], [facility], MD  DRUG ALLERGIES:   Allergies  Allergen Reactions  . Adhesive [Tape] Other (See Comments)    Skin Irritation   . Antifungal [Miconazole Nitrate] Rash  . Sulfa Antibiotics Nausea And Vomiting and Rash  . Z-Pak [Azithromycin] Itching    DISCHARGE MEDICATIONS:   Allergies as of 02/22/2020      Reactions   Adhesive [tape] Other (See Comments)   Skin Irritation   Antifungal [miconazole Nitrate] Rash   Sulfa Antibiotics Nausea And Vomiting, Rash   Z-pak [azithromycin] Itching      Medication List    STOP taking these medications   apixaban 5 MG Tabs tablet Commonly known as: ELIQUIS   aspirin EC 81 MG tablet   Lenvima (10 MG Daily Dose) capsule Generic drug: lenvatinib 10 mg daily dose   predniSONE 10 MG (21) Tbpk tablet Commonly known as: STERAPRED UNI-PAK 21 TAB     TAKE these medications   atorvastatin 10 MG tablet Commonly known as: LIPITOR Take 1 tablet by mouth once daily What changed: when to take this   carvedilol 3.125 MG tablet Commonly known as: Coreg Take 1 tablet (3.125 mg total) by mouth 2 (two) times daily with a meal.   chlorpheniramine-HYDROcodone 10-8 MG/5ML Suer Commonly known as:  Tussionex Pennkinetic ER Take 5 mLs by mouth every 12 (twelve) hours as needed for cough.   cholecalciferol 25 MCG (1000 UNIT) tablet Commonly known as: VITAMIN D3 Take 1,000 Units by mouth in the morning and at bedtime.   cyanocobalamin 1000 MCG/ML injection Commonly known as: (VITAMIN B-12) INJECT 1ML IM WEEKLY FOR 4 WEEKS, THEN INJECT MONTHLY THEREAFTER What changed:  how much to take how to take this when to take this additional instructions   DULoxetine 30 MG capsule Commonly known as: CYMBALTA Take 1 capsule (30 mg total) by mouth daily.   furosemide 20 MG tablet Commonly known as: Lasix Take 1 tablet (20 mg total) by mouth as needed for fluid or edema (Take one tablet daily as needed for lower extremity edema or for weight gain of 2 lbs overnight or 5 lbs in one week.). What changed: when to take this   guaiFENesin 600 MG 12 hr tablet Commonly known as: Mucinex Take 1 tablet (600 mg total) by mouth 2 (two) times daily for 5 days.   levothyroxine 75 MCG tablet Commonly known as: Synthroid Take 1 tablet (75 mcg total) by mouth daily before breakfast. What changed:  medication strength how much to take   LORazepam 0.5 MG tablet Commonly known as: ATIVAN Take 1 tablet (0.5 mg total) by mouth every 6 (six) hours as needed for anxiety (and to help with breathing).   losartan 25 MG tablet Commonly  known as: COZAAR Take 0.5 tablets (12.5 mg total) by mouth daily.   pantoprazole 20 MG tablet Commonly known as: Protonix Take 1 tablet (20 mg total) by mouth daily.            Durable Medical Equipment  (From admission, onward)         Start     Ordered   02/22/20 1458  For home use only DME oxygen  Once       Question Answer Comment  Length of Need 6 Months   Mode or (Route) Nasal cannula   Liters per Minute 2   Frequency Continuous (stationary and portable oxygen unit needed)   Oxygen conserving device Yes   Oxygen delivery system Gas      02/22/20 1458           If you experience worsening of your admission symptoms, develop shortness of breath, life threatening emergency, suicidal or homicidal thoughts you must seek medical attention immediately by calling 911 or calling your MD immediately  if symptoms less severe.  You Must read complete instructions/literature along with all the possible adverse reactions/side effects for all the Medicines you take and that have been prescribed to you. Take any new Medicines after you have completely understood and accept all the possible adverse reactions/side effects.   Please note  You were cared for by a hospitalist during your hospital stay. If you have any questions about your discharge medications or the care you received while you were in the hospital after you are discharged, you can call the unit and asked to speak with the hospitalist on call if the hospitalist that took care of you is not available. Once you are discharged, your primary care physician will handle any further medical issues. Please note that NO REFILLS for any discharge medications will be authorized once you are discharged, as it is imperative that you return to your primary care physician (or establish a relationship with a primary care physician if you do not have one) for your aftercare needs so that they can reassess your need for medications and monitor your lab values. Today   SUBJECTIVE   After minimal amount of old blood earlier. Breathing much improved. Seen by physical therapy.  VITAL SIGNS:  Blood pressure (!) 106/58, pulse 89, temperature 98.4 F (36.9 C), temperature source Oral, resp. rate 20, height 5\' 2"  (1.575 m), weight 71.9 kg, SpO2 94 %.  I/O:    Intake/Output Summary (Last 24 hours) at 02/22/2020 1528 Last data filed at 02/22/2020 1130 Gross per 24 hour  Intake 480 ml  Output 2450 ml  Net -1970 ml    PHYSICAL EXAMINATION:  GENERAL:  69 y.o.-year-old patient lying in the bed with no acute distress.   EYES: Pupils equal, round, reactive to light and accommodation. No scleral icterus.  HEENT: Head atraumatic, normocephalic. Oropharynx and nasopharynx clear.  NECK:  Supple, no jugular venous distention. No thyroid enlargement, no tenderness.  LUNGS: decreased breath sounds bilaterally, no wheezing, rales,rhonchi or crepitation. No use of accessory muscles of respiration.  CARDIOVASCULAR: S1, S2 normal. No murmurs, rubs, or gallops.  ABDOMEN: Soft, non-tender, non-distended. Bowel sounds present. No organomegaly or mass.  EXTREMITIES: No pedal edema, cyanosis, or clubbing.  NEUROLOGIC: Cranial nerves II through XII are intact. Muscle strength 5/5 in all extremities. Sensation intact. Gait not checked.  PSYCHIATRIC: The patient is alert and oriented x 3.  SKIN: No obvious rash, lesion, or ulcer.   DATA REVIEW:   CBC  Recent Labs  Lab 02/22/20 0519  WBC 10.6*  HGB 8.7*  HCT 27.0*  PLT 120*    Chemistries  Recent Labs  Lab 02/19/20 2125 02/20/20 0411 02/21/20 0427 02/21/20 0427 02/22/20 0519  NA 142   < > 138   < > 141  K 3.3*   < > 4.1   < > 3.4*  CL 104   < > 101   < > 101  CO2 25   < > 28   < > 28  GLUCOSE 118*   < > 142*   < > 150*  BUN 26*   < > 32*   < > 32*  CREATININE 1.43*   < > 1.34*   < > 1.17*  CALCIUM 8.7*   < > 8.2*   < > 8.5*  MG  --    < > 2.1  --   --   AST 31  --   --   --   --   ALT 18  --   --   --   --   ALKPHOS 63  --   --   --   --   BILITOT 2.3*  --   --   --   --    < > = values in this interval not displayed.    Microbiology Results   Recent Results (from the past 240 hour(s))  SARS Coronavirus 2 by RT PCR (hospital order, performed in [facility] hospital lab) Nasopharyngeal Nasopharyngeal Swab     Status: None   Collection Time: 02/14/20  4:52 AM   Specimen: Nasopharyngeal Swab  Result Value Ref Range Status   SARS Coronavirus 2 NEGATIVE NEGATIVE Final    Comment: (NOTE) SARS-CoV-2 target nucleic acids are NOT DETECTED.  The  SARS-CoV-2 RNA is generally detectable in upper and lower respiratory specimens during the acute phase of infection. The lowest concentration of SARS-CoV-2 viral copies this assay can detect is 250 copies / mL. A negative result does not preclude SARS-CoV-2 infection and should not be used as the sole basis for treatment or other patient management decisions.  A negative result may occur with improper specimen collection / handling, submission of specimen other than nasopharyngeal swab, presence of viral mutation(s) within the areas targeted by this assay, and inadequate number of viral copies (<250 copies / mL). A negative result must be combined with clinical observations, patient history, and epidemiological information.  Fact Sheet for Patients:   StrictlyIdeas.no  Fact Sheet for Healthcare Providers: BankingDealers.co.za  This test is not yet approved or  cleared by the [place] FDA and has been authorized for detection and/or diagnosis of SARS-CoV-2 by FDA under an Emergency Use Authorization (EUA).  This EUA will remain in effect (meaning this test can be used) for the duration of the COVID-19 declaration under Section 564(b)(1) of the Act, 21 U.S.C. section 360bbb-3(b)(1), unless the authorization is terminated or revoked sooner.  Performed at [facility], [address] [place], [place] [address]   SARS Coronavirus 2 by RT PCR (hospital order, performed in [facility] hospital lab) Nasopharyngeal Nasopharyngeal Swab     Status: None   Collection Time: 02/19/20  9:25 PM   Specimen: Nasopharyngeal Swab  Result Value Ref Range Status   SARS Coronavirus 2 NEGATIVE NEGATIVE Final    Comment: (NOTE) SARS-CoV-2 target nucleic acids are NOT DETECTED.  The SARS-CoV-2 RNA is generally detectable in upper and lower respiratory specimens during the acute phase of  infection. The lowest concentration of SARS-CoV-2 viral  copies this assay can detect is 250 copies / mL. A negative result does not preclude SARS-CoV-2 infection and should not be used as the sole basis for treatment or other patient management decisions.  A negative result may occur with improper specimen collection / handling, submission of specimen other than nasopharyngeal swab, presence of viral mutation(s) within the areas targeted by this assay, and inadequate number of viral copies (<250 copies / mL). A negative result must be combined with clinical observations, patient history, and epidemiological information.  Fact Sheet for Patients:   StrictlyIdeas.no  Fact Sheet for Healthcare Providers: BankingDealers.co.za  This test is not yet approved or  cleared by the [place] FDA and has been authorized for detection and/or diagnosis of SARS-CoV-2 by FDA under an Emergency Use Authorization (EUA).  This EUA will remain in effect (meaning this test can be used) for the duration of the COVID-19 declaration under Section 564(b)(1) of the Act, 21 U.S.C. section 360bbb-3(b)(1), unless the authorization is terminated or revoked sooner.  Performed at [facility], [address] [place], [place] [address]   Blood culture (routine single)     Status: Abnormal   Collection Time: 02/19/20  9:25 PM   Specimen: BLOOD  Result Value Ref Range Status   Specimen Description   Final    BLOOD RTAC Performed at [facility], [address] [place], [place] [address]    Special Requests   Final    BOTTLES DRAWN AEROBIC AND ANAEROBIC Blood Culture adequate volume Performed at [facility], [address] [place], [place] [address]    Culture  Setup Time   Final    Organism ID to follow [place] AND ANAEROBIC BOTTLES CRITICAL RESULT CALLED TO, READ BACK BY AND VERIFIED WITH: MORGAN COUNNINGHAM 02/20/20 AT 1907 BY AR GRAM STAIN  REVIEWED-AGREE WITH RESULT    Culture (A)  Final    STAPHYLOCOCCUS EPIDERMIDIS THE SIGNIFICANCE OF ISOLATING THIS ORGANISM FROM A SINGLE SET OF BLOOD CULTURES WHEN MULTIPLE SETS ARE DRAWN IS UNCERTAIN. PLEASE NOTIFY THE MICROBIOLOGY DEPARTMENT WITHIN ONE WEEK IF SPECIATION AND SENSITIVITIES ARE REQUIRED. Performed at [facility], [place] [address], [place], [place] [address]    Report Status 02/22/2020 FINAL  Final  Blood Culture ID Panel (Reflexed)     Status: Abnormal   Collection Time: 02/19/20  9:25 PM  Result Value Ref Range Status   Enterococcus faecalis NOT DETECTED NOT DETECTED Final   Enterococcus Faecium NOT DETECTED NOT DETECTED Final   Listeria monocytogenes NOT DETECTED NOT DETECTED Final   Staphylococcus species DETECTED (A) NOT DETECTED Final    Comment: CRITICAL RESULT CALLED TO, READ BACK BY AND VERIFIED WITH: Marilynne Halsted 02/20/20 AT 1907 BY ACR    Staphylococcus aureus (BCID) NOT DETECTED NOT DETECTED Final   Staphylococcus epidermidis DETECTED (A) NOT DETECTED Final    Comment: Methicillin (oxacillin) resistant coagulase negative staphylococcus. Possible blood culture contaminant (unless isolated from more than one blood culture draw or clinical case suggests pathogenicity). No antibiotic treatment is indicated for blood  culture contaminants. CRITICAL RESULT CALLED TO, READ BACK BY AND VERIFIED WITH: Marilynne Halsted 02/20/20 AT 1907 BY AR    Staphylococcus lugdunensis NOT DETECTED NOT DETECTED Final   Streptococcus species NOT DETECTED NOT DETECTED Final   Streptococcus agalactiae NOT DETECTED NOT DETECTED Final   Streptococcus pneumoniae NOT DETECTED NOT DETECTED Final   Streptococcus pyogenes NOT DETECTED NOT  DETECTED Final   A.calcoaceticus-baumannii NOT DETECTED NOT DETECTED Final   Bacteroides fragilis NOT DETECTED NOT DETECTED Final   Enterobacterales NOT DETECTED NOT DETECTED Final   Enterobacter cloacae complex NOT DETECTED NOT DETECTED Final    Escherichia coli NOT DETECTED NOT DETECTED Final   Klebsiella aerogenes NOT DETECTED NOT DETECTED Final   Klebsiella oxytoca NOT DETECTED NOT DETECTED Final   Klebsiella pneumoniae NOT DETECTED NOT DETECTED Final   Proteus species NOT DETECTED NOT DETECTED Final   Salmonella species NOT DETECTED NOT DETECTED Final   Serratia marcescens NOT DETECTED NOT DETECTED Final   Haemophilus influenzae NOT DETECTED NOT DETECTED Final   Neisseria meningitidis NOT DETECTED NOT DETECTED Final   Pseudomonas aeruginosa NOT DETECTED NOT DETECTED Final   Stenotrophomonas maltophilia NOT DETECTED NOT DETECTED Final   Candida albicans NOT DETECTED NOT DETECTED Final   Candida auris NOT DETECTED NOT DETECTED Final   Candida glabrata NOT DETECTED NOT DETECTED Final   Candida krusei NOT DETECTED NOT DETECTED Final   Candida parapsilosis NOT DETECTED NOT DETECTED Final   Candida tropicalis NOT DETECTED NOT DETECTED Final   Cryptococcus neoformans/gattii NOT DETECTED NOT DETECTED Final   Methicillin resistance mecA/C DETECTED (A) NOT DETECTED Final    Comment: CRITICAL RESULT CALLED TO, READ BACK BY AND VERIFIED WITH: Marilynne Halsted 02/20/20 AT 1907 BY AR Performed at [facility], [address] [place], [place] [address]   Culture, blood (single)     Status: None (Preliminary result)   Collection Time: 02/20/20 12:25 AM   Specimen: BLOOD  Result Value Ref Range Status   Specimen Description BLOOD RTHAND  Final   Special Requests   Final    BOTTLES DRAWN AEROBIC AND ANAEROBIC Blood Culture adequate volume   Culture   Final    NO GROWTH 2 DAYS Performed at [facility], [address], [place], [place] [address]    Report Status PENDING  Incomplete  Urine culture     Status: Abnormal   Collection Time: 02/20/20  2:00 AM   Specimen: In/Out Cath Urine  Result Value Ref Range Status   Specimen Description   Final    IN/OUT CATH URINE Performed at [facility], [address] [place], [place] [address]    Special Requests   Final    NONE Performed at [facility], [address] [place], [place] [address]    Culture >=100,000 COLONIES/mL ENTEROCOCCUS FAECALIS (A)  Final   Report Status 02/22/2020 FINAL  Final   Organism ID, Bacteria ENTEROCOCCUS FAECALIS (A)  Final      Susceptibility   Enterococcus faecalis - MIC*    AMPICILLIN <=2 SENSITIVE Sensitive     NITROFURANTOIN <=16 SENSITIVE Sensitive     VANCOMYCIN 2 SENSITIVE Sensitive     * >=100,000 COLONIES/mL ENTEROCOCCUS FAECALIS  Expectorated sputum assessment w rflx to resp cult     Status: None   Collection Time: 02/20/20  4:11 AM   Specimen: Sputum  Result Value Ref Range Status   Specimen Description SPUTUM  Final   Special Requests NONE  Final   Sputum evaluation   Final    THIS SPECIMEN IS ACCEPTABLE FOR SPUTUM CULTURE Performed at [facility], [address], [place], [place] [address]    Report Status 02/20/2020 FINAL  Final  Culture, respiratory     Status: None   Collection Time: 02/20/20  4:11 AM   Specimen: SPU  Result Value Ref Range Status   Specimen  Description   Final    SPUTUM Performed at [facility], [address] [place], [place] [address]    Special Requests   Final    NONE Reflexed from [id number] Performed at [facility], [place], [place] [address]    Gram Stain NO WBC SEEN RARE GRAM POSITIVE COCCI IN PAIRS   Final   Culture   Final    RARE Normal respiratory flora-no Staph aureus or Pseudomonas seen Performed at [facility], [place] [address], [place], [place] [address]    Report Status 02/22/2020 FINAL  Final  MRSA PCR Screening     Status: None   Collection Time: 02/21/20 10:23 PM   Specimen: Nasopharyngeal  Result Value Ref Range Status   MRSA by PCR NEGATIVE NEGATIVE Final    Comment:        The GeneXpert MRSA Assay (FDA approved for NASAL specimens only), is one component of  a comprehensive MRSA colonization surveillance program. It is not intended to diagnose MRSA infection nor to guide or monitor treatment for MRSA infections. Performed at [facility], [address] [place], [place] [address]     RADIOLOGY:  No results found.   CODE STATUS:     Code Status Orders  (From admission, onward)         Start     Ordered   02/20/20 1521  Do not attempt resuscitation (DNR)  Continuous       Question Answer Comment  In the event of cardiac or respiratory ARREST Do not call a "code blue"   In the event of cardiac or respiratory ARREST Do not perform Intubation, CPR, defibrillation or ACLS   In the event of cardiac or respiratory ARREST Use medication by any route, position, wound care, and other measures to relive pain and suffering. May use oxygen, suction and manual treatment of airway obstruction as needed for comfort.      02/20/20 1520        Code Status History    Date Active Date Inactive Code Status Order ID Comments User Context   02/19/2020 2326 02/20/2020 1520 Full Code [id number]  Christel Mormon, MD ED   02/14/2020 0808 02/16/2020 1817 Full Code [id number]  Collier Bullock, MD ED   12/29/2019 1134 12/30/2019 2331 Full Code [id number]  Ivor Costa, MD ED   12/04/2019 1150 12/04/2019 1921 Full Code [id number]  Wellington [place], MD Inpatient   08/05/2016 2218 08/07/2016 2017 Full Code [id number]  Ward, Honor Loh, MD ED   Advance Care Planning Activity       TOTAL TIME TAKING CARE OF THIS PATIENT: *35* minutes.    Fritzi Mandes M.D  [facility]    CC: Primary care physician; [name], MD

## 2020-02-22 NOTE — Progress Notes (Signed)
Per CCMD patient had 12 beats of v-tach. Dr Posey Pronto on the floor and notified. Pt asymptomatic, VSS. Received verbal orders for 40 mEq potassium chloride. Will administer and continue to monitor.

## 2020-02-22 NOTE — Plan of Care (Signed)
  Problem: Health Behavior/Discharge Planning: Goal: Ability to manage health-related needs will improve Outcome: Progressing   Problem: Clinical Measurements: Goal: Respiratory complications will improve Outcome: Progressing   Problem: Pain Managment: Goal: General experience of comfort will improve Outcome: Progressing   Problem: Safety: Goal: Ability to remain free from injury will improve Outcome: Progressing   

## 2020-02-22 NOTE — Progress Notes (Signed)
Hematology/Oncology Consult note [facility]  [phone] [phone]  Patient Care Team: [name], MD as PCP - General (Internal Medicine) Wellington Hampshire, MD as PCP - Cardiology (Cardiology) [name], MD as Consulting Physician (General Surgery) Mellody Drown, MD as Referring Physician (Obstetrics and Gynecology) [name], MD as Referring Physician (Obstetrics and Gynecology) Hollice Espy, MD as Consulting Physician (Urology) [name], RN as Registered Nurse Sindy Guadeloupe, MD as Consulting Physician (Oncology)   Name of the patient: Jaime Hall  [id number]  [birth year]   Date of visit: 02/22/2020  Interval history-she appears more comfortable at rest today and down to 2 L of oxygen.  Did not have any overt hemoptysis today.  ECOG PS- 2-3 Pain scale- 0   Review of systems- Review of Systems  Constitutional: Positive for malaise/fatigue. Negative for chills, fever and weight loss.  HENT: Negative for congestion, ear discharge and nosebleeds.   Eyes: Negative for blurred vision.  Respiratory: Positive for shortness of breath. Negative for cough, hemoptysis, sputum production and wheezing.   Cardiovascular: Negative for chest pain, palpitations, orthopnea and claudication.  Gastrointestinal: Negative for abdominal pain, blood in stool, constipation, diarrhea, heartburn, melena, nausea and vomiting.  Genitourinary: Negative for dysuria, flank pain, frequency, hematuria and urgency.  Musculoskeletal: Negative for back pain, joint pain and myalgias.  Skin: Negative for rash.  Neurological: Negative for dizziness, tingling, focal weakness, seizures, weakness and headaches.  Endo/Heme/Allergies: Does not bruise/bleed easily.  Psychiatric/Behavioral: Negative for depression and suicidal ideas. The patient does not have insomnia.      Allergies  Allergen Reactions  . Adhesive [Tape]  Other (See Comments)    Skin Irritation   . Antifungal [Miconazole Nitrate] Rash  . Sulfa Antibiotics Nausea And Vomiting and Rash  . Z-Pak [Azithromycin] Itching     Past Medical History:  Diagnosis Date  . Anxiety   . Cervicalgia   . CHF (congestive heart failure) ([place])   . Chronic kidney disease   . Coronary artery disease    a. 02/2012 Stress echo: severe anterior wall ischemia;  b. 02/2012 Cath/PCI: LAD 95p (3.0 x 15 Xience EX DES), D1 90ost (PTCA - bifurcational dzs), EF 45% with anterior HK;  b. 02/2013 Ex MV: fixed anterior defect w/ minor reversibility, nl EF-->Med Rx.  . Endometrial cancer ([place])    a. 07/2016 s/p robotic hysterectomy, BSO w/ washings, sentinel node inj, mapping, bx, adhesiolysis.  . Essential hypertension, benign   . Fibrocystic breast disease   . GERD (gastroesophageal reflux disease)   . Gestational hypertension   . Heart murmur   . History of anemia   . History of blood transfusion   . Hodgkin's lymphoma ([place]) 2011   a. s/p radiation and chemo therapy  . Osteoarthritis   . Polycystic ovarian disease      Past Surgical History:  Procedure Laterality Date  . ABDOMINAL HYSTERECTOMY    . bladder sling    . CARDIAC CATHETERIZATION  02/2012   [facility] 1 stent place  . CERVICAL POLYPECTOMY    . CHOLECYSTECTOMY  1982  . COLONOSCOPY WITH PROPOFOL N/A 02/05/2015   Procedure: COLONOSCOPY WITH PROPOFOL;  Surgeon: Lucilla Lame, MD;  Location: [facility];  Service: Endoscopy;  Laterality: N/A;  . CORONARY ANGIOPLASTY  02/2012   left/right s/p balloon  . CYSTOGRAM N/A 08/17/2016   Procedure: CYSTOGRAM;  Surgeon: Hollice Espy, MD;  Location: [facility] OR[facility];  Service: Urology;  Laterality: N/A;  .  CYSTOSCOPY N/A 08/17/2016   Procedure: CYSTOSCOPY EXAM UNDER ANESTHESIA;  Surgeon: Hollice Espy, MD;  Location: [facility] ORS;  Service: Urology;  Laterality: N/A;  . CYSTOSCOPY W/ RETROGRADES Bilateral 08/17/2016   Procedure: CYSTOSCOPY WITH RETROGRADE PYELOGRAM;  Surgeon: Hollice Espy, MD;  Location: [facility] OR[facility];  Service: Urology;  Laterality: Bilateral;  . CYSTOSCOPY WITH STENT PLACEMENT Right 08/17/2016   Procedure: CYSTOSCOPY WITH STENT PLACEMENT;  Surgeon: Hollice Espy, MD;  Location: [facility];  Service: Urology;  Laterality: Right;  . heart stent'  2013  . kidney stent Right 2018  . LYMPH NODE BIOPSY  2011   diagnosis of hodgkins lymphoma  . PELVIC LYMPH NODE DISSECTION N/A 07/29/2016   Procedure: PELVIC/AORTIC LYMPH NODE SAMPLING;  Surgeon: [name], MD;  Location: [facility] OR[facility];  Service: Gynecology;  Laterality: N/A;  . PORTA CATH INSERTION N/A 09/22/2016   Procedure: Glori Luis Cath Insertion;  Surgeon: [name], MD;  Location: [place] [facility];  Service: Cardiovascular;  Laterality: N/A;  . PORTA CATH INSERTION N/A 10/27/2019   Procedure: PORTA CATH INSERTION;  Surgeon: [name], MD;  Location: [place] [facility];  Service: Cardiovascular;  Laterality: N/A;  . PORTA CATH REMOVAL N/A 11/17/2016   Procedure: Glori Luis Cath Removal;  Surgeon: [name], MD;  Location: [place] [facility];  Service: Cardiovascular;  Laterality: N/A;  . RIGHT/LEFT HEART CATH AND CORONARY ANGIOGRAPHY N/A 12/04/2019   Procedure: RIGHT/LEFT HEART CATH AND CORONARY ANGIOGRAPHY;  Surgeon: Wellington Hampshire, MD;  Location: [place] [facility];  Service: Cardiovascular;  Laterality: N/A;  . ROBOTIC ASSISTED TOTAL HYSTERECTOMY WITH BILATERAL SALPINGO OOPHERECTOMY N/A 07/29/2016   Procedure: ROBOTIC ASSISTED TOTAL HYSTERECTOMY WITH BILATERAL SALPINGO OOPHORECTOMY;  Surgeon: [name], MD;  Location: [facility];  Service: Gynecology;  Laterality: N/A;  . SENTINEL NODE BIOPSY N/A 07/29/2016   Procedure: SENTINEL NODE BIOPSY;  Surgeon: [name], MD;  Location: [facility];  Service: Gynecology;  Laterality: N/A;  . transobturator sling N/A 2009   [place]    Social History   Socioeconomic History  . Marital status: Widowed    Spouse  name: Not on file  . Number of children: Not on file  . Years of education: Not on file  . Highest education level: Not on file  Occupational History  . Not on file  Tobacco Use  . Smoking status: Former Smoker    Packs/day: 1.00    Years: 30.00    Pack years: 30.00    Types: Cigarettes    Quit date: 07/24/2002    Years since quitting: 17.5  . Smokeless tobacco: Never Used  . Tobacco comment: quit smoking in 2000  Vaping Use  . Vaping Use: Never used  Substance and Sexual Activity  . Alcohol use: Not Currently  . Drug use: No  . Sexual activity: Never  Other Topics Concern  . 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    Social Determinants of Health   Financial Resource Strain:   . Difficulty of Paying Living Expenses: Not on file  Food Insecurity:   . Worried About Charity fundraiser in the Last Year: Not on file  . Ran Out of Food in the Last Year: Not on file  Transportation Needs:   . Lack of Transportation (Medical): Not on file  . Lack of Transportation (Non-Medical): Not on file  Physical Activity:   . Days of Exercise per Week: Not on file  . Minutes of Exercise per Session: Not on file  Stress:   . Feeling of Stress : Not on file  Social Connections:   . Frequency of Communication with Friends and Family: Not on file  . Frequency of Social Gatherings with Friends and Family: Not on file  . Attends Religious Services: Not on file  . Active Member of Clubs or Organizations: Not on file  . Attends Archivist Meetings: Not on file  . Marital Status: Not on file  Intimate Partner Violence:   . Fear of Current or Ex-Partner: Not on file  . Emotionally Abused: Not on file  . Physically Abused: Not on file  . Sexually Abused: Not on file    Family History  Problem Relation Age of Onset  . ALS Father   . Polymyositis Father   . Diabetes Brother   . Cancer Maternal  Aunt        breast  . Breast cancer Maternal Aunt        30's  . Stroke Maternal Grandmother   . Cancer Maternal Grandfather        prostate  . Stroke Maternal Grandfather   . Colon cancer Maternal Aunt   . Non-Hodgkin's lymphoma Cousin      Current Facility-Administered Medications:  .  0.9 %  sodium chloride infusion, 250 mL, Intravenous, PRN, Mansy, Jan A, MD .  acetaminophen (TYLENOL) tablet 650 mg, 650 mg, Oral, Q4H PRN, Mansy, Jan A, MD .  ALPRAZolam Duanne Moron) tablet 0.25 mg, 0.25 mg, Oral, BID PRN, Mansy, Jan A, MD .  atorvastatin (LIPITOR) tablet 10 mg, 10 mg, Oral, QPM, Mansy, Jan A, MD, 10 mg at 02/20/20 1747 .  carvedilol (COREG) tablet 3.125 mg, 3.125 mg, Oral, BID WC, Mansy, Jan A, MD, 3.125 mg at 02/22/20 0934 .  Chlorhexidine Gluconate Cloth 2 % PADS 6 each, 6 each, Topical, Daily, Fritzi Mandes, MD, 6 each at 02/22/20 1105 .  chlorpheniramine-HYDROcodone (TUSSIONEX) 10-8 MG/5ML suspension 5 mL, 5 mL, Oral, Q12H PRN, Mansy, Jan A, MD, 5 mL at 02/20/20 1747 .  cholecalciferol (VITAMIN D3) tablet 1,000 Units, 1,000 Units, Oral, Daily, Mansy, Arvella Merles, MD, 1,000 Units at 02/22/20 0934 .  [START ON 02/26/2020] cyanocobalamin ((VITAMIN B-12)) injection 1,000 mcg, 1,000 mcg, Subcutaneous, Q30 days, Mansy, Jan A, MD .  DULoxetine (CYMBALTA) DR capsule 30 mg, 30 mg, Oral, Daily, Mansy, Jan A, MD, 30 mg at 02/22/20 0936 .  furosemide (LASIX) tablet 40 mg, 40 mg, Oral, Daily, Agbor-Etang, Aaron Edelman, MD, 40 mg at 02/22/20 0934 .  guaiFENesin (MUCINEX) 12 hr tablet 600 mg, 600 mg, Oral, BID, Mansy, Jan A, MD, 600 mg at 02/22/20 0934 .  ipratropium-albuterol (DUONEB) 0.5-2.5 (3) MG/3ML nebulizer solution 3 mL, 3 mL, Nebulization, BID, Fritzi Mandes, MD .  levothyroxine (SYNTHROID) tablet 62.5 mcg, 62.5 mcg, Oral, QAC breakfast, Eugenie Filler, MD, 62.5 mcg at 02/22/20 0509 .  losartan (COZAAR) tablet 12.5 mg, 12.5 mg, Oral, Daily, Mansy, Jan A, MD, 12.5 mg at 02/22/20 0934 .  morphine  CONCENTRATE 10 MG/0.5ML oral solution 5 mg, 5 mg, Oral, Q4H PRN, Sindy Guadeloupe, MD, 5 mg at 02/20/20 1627 .  nystatin (MYCOSTATIN/NYSTOP) topical powder 1 application, 1 application, Topical, QID, Mansy, Arvella Merles, MD, 1 application at 00/93/81 0939 .  pantoprazole (PROTONIX) EC tablet 20 mg, 20 mg, Oral, Daily, Mansy, Jan A, MD, 20 mg at 02/22/20 0935 .  sodium chloride flush (NS) 0.9 % injection 3 mL, 3 mL, Intravenous, Q12H, Mansy, Jan A, MD, 3 mL at 02/22/20 0939 .  sodium chloride flush (NS) 0.9 % injection 3 mL, 3 mL, Intravenous, PRN, Mansy, Arvella Merles, MD  Facility-Administered Medications Ordered in Other Encounters:  .  heparin lock flush 100 unit/mL, 500 Units, Intravenous, Once, Randa Evens C, MD .  sodium chloride flush (NS) 0.9 % injection 10 mL, 10 mL, Intravenous, PRN, Sindy Guadeloupe, MD, 10 mL at 12/07/19 0914 .  sodium chloride flush (NS) 0.9 % injection 10 mL, 10 mL, Intravenous, PRN, Sindy Guadeloupe, MD, 10 mL at 01/04/20 0850  Physical exam:  Vitals:   02/22/20 0945 02/22/20 1015 02/22/20 1016 02/22/20 1121  BP:    (!) 106/58  Pulse:    89  Resp:    20  Temp:    98.4 F (36.9 C)  TempSrc:    Oral  SpO2: (!) 84% 90% 94% 94%  Weight:      Height:       Physical Exam Constitutional:      General: She is not in acute distress. Cardiovascular:     Rate and Rhythm: Normal rate and regular rhythm.     Heart sounds: Normal heart sounds.  Pulmonary:     Effort: Pulmonary effort is normal.     Comments: Breath sounds decreased over bases Abdominal:     General: Bowel sounds are normal.     Palpations: Abdomen is soft.  Skin:    General: Skin is warm and dry.  Neurological:     Mental Status: She is alert and oriented to person, place, and time.      CMP Latest Ref Rng & Units 02/22/2020  Glucose 70 - 99 mg/dL 150(H)  BUN 8 - 23 mg/dL 32(H)  Creatinine 0.44 - 1.00 mg/dL 1.17(H)  Sodium 135 - 145 mmol/L 141  Potassium 3.5 - 5.1 mmol/L 3.4(L)  Chloride 98 - 111 mmol/L  101  CO2 22 - 32 mmol/L 28  Calcium 8.9 - 10.3 mg/dL 8.5(L)  Total Protein 6.5 - 8.1 g/dL -  Total Bilirubin 0.3 - 1.2 mg/dL -  Alkaline Phos 38 - 126 U/L -  AST 15 - 41 U/L -  ALT 0 - 44 U/L -   CBC Latest Ref Rng & Units 02/22/2020  WBC 4.0 - 10.5 K/uL 10.6(H)  Hemoglobin 12.0 - 15.0 g/dL 8.7(L)  Hematocrit 36 - 46 % 27.0(L)  Platelets 150 - 400 K/uL 120(L)    @IMAGES @  DG Chest 2 View  Result Date: 02/13/2020 CLINICAL DATA:  Shortness of breath. On chemotherapy for endometrial cancer. EXAM: CHEST - 2 VIEW COMPARISON:  12/29/2019 FINDINGS: Midline trachea. Normal heart size. Right sided central line terminates at the caval atrial junction. Paramediastinal radiation fibrosis with architectural distortion, similar. No pleural effusion or pneumothorax. Mild right hemidiaphragm elevation. Basilar predominant interstitial thickening, likely related to COPD/chronic bronchitis. No lobar consolidation. Right upper quadrant surgical clips. IMPRESSION: No acute cardiopulmonary disease. Electronically Signed   By: Abigail Miyamoto M.D.   On: 02/13/2020 17:29   CT Angio Chest PE W and/or Wo Contrast  Result Date: 02/14/2020 CLINICAL DATA:  High probability for pulmonary embolism. Shortness of breath. On chemotherapy for endometrial cancer. EXAM: CT ANGIOGRAPHY CHEST WITH CONTRAST TECHNIQUE: Multidetector CT imaging of the chest was performed using the standard protocol during bolus administration of intravenous contrast. Multiplanar CT image reconstructions and MIPs were obtained to evaluate the vascular anatomy. CONTRAST:  79mL OMNIPAQUE IOHEXOL 350 MG/ML SOLN COMPARISON:  01/16/2020 chest CT FINDINGS: Cardiovascular: Cardiomegaly, most notably left heart dilatation. Atherosclerotic calcification and LAD stenting. Branching, central pulmonary artery filling defects in lateral and posterior segments of the right lower lobe. Porta catheter on the right. Mediastinum/Nodes: No adenopathy Lungs/Pleura: Bilateral  perihilar radiation fibrosis. Mild ground-glass density asymmetric to the right base. Upper Abdomen: No acute finding.  Cholecystectomy. Musculoskeletal: No acute or aggressive finding. Review of the MIP images confirms the above findings. Critical Value/emergent results were called by telephone at the time of interpretation on 02/14/2020 at 6:28 am to provider [facility] , who verbally acknowledged these results. IMPRESSION: 1. Acute segmental emboli to the right lower lobe. 2. Mild ground-glass opacity in the right lower lobe, unlikely to be ischemic given the nonocclusive clot and patchy pattern. Please correlate for infectious symptoms. Electronically Signed   By: Monte Fantasia M.D.   On: 02/14/2020 06:30   US Venous Img Lower Bilateral (DVT)  Result Date: 02/20/2020 CLINICAL DATA:  PE EXAM: BILATERAL LOWER EXTREMITY VENOUS DOPPLER ULTRASOUND TECHNIQUE: Gray-scale sonography with compression, as well as color and duplex ultrasound, were performed to evaluate the deep venous system(s) from the level of the common femoral vein through the popliteal and proximal calf veins. COMPARISON:  None. FINDINGS: VENOUS Normal compressibility of the common femoral, superficial femoral, and popliteal veins, as well as the visualized calf veins (peroneal vein not well visualized on the left). Visualized portions of profunda femoral vein and great saphenous vein unremarkable. No filling defects to suggest DVT on grayscale or color Doppler imaging. Doppler waveforms show normal direction of venous flow, normal respiratory plasticity and response to augmentation. OTHER None. Limitations: none IMPRESSION: No evidence of DVT in the lower extremities. Electronically Signed   By: Macy Mis M.D.   On: 02/20/2020 12:08   DG Chest Portable 1 View  Result Date: 02/19/2020 CLINICAL DATA:  Pt arrives via [facility] from home with complaint of SOB. Pt low 70's% on RA, pt with blue lips. EMS administered 1 duoneb and placed on 8L  NRB, spo2 100%. Pt tachy in the low 100's for HR EXAM: PORTABLE CHEST 1 VIEW COMPARISON:  02/13/2020 FINDINGS: Hazy opacities are noted in both lungs. Scarring is noted extending from the hila into the medial upper lobes. No convincing pleural effusion and no pneumothorax. Right internal jugular central venous catheter is stable, tip in the lower superior vena cava. Cardiac silhouette is mildly enlarged. No mediastinal or hilar masses. No evidence of adenopathy. Skeletal structures are grossly intact. IMPRESSION: 1. Hazy opacity is noted in lungs, representing a change from the most recent prior chest radiograph. Findings may be due to multifocal pneumonia or pulmonary edema. Electronically Signed   By: Lajean Manes M.D.   On: 02/19/2020 21:41     Assessment and plan- Patient is a 69 y.o. 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  She has been admitted for acute hypoxic respiratory failure and hemoptysis  Hemoptysis: Etiology unclear.  Possibly secondary to acute congestive heart failure versus acute PE.  No further hemoptysis at this time.  Okay to hold Eliquis at the time of discharge and I will follow up with her on 02/27/2020.  If she does not have any hemoptysis over the next few days I will consider restarting Eliquis at 2.5 mg twice daily.  Hold off on IVC filter at this time as it would not prevent propagation of existing PE but would  only prevent new PE from an existing DVT which the patient does not have.  Endometrial cancer: Michel Santee will be on hold and she will not take it upon discharge.  I will assess her next week and continue to address further goals of care: Continuing Keytruda versus consideration for hospice.  Anemia/thrombocytopenia: Patient was unable to tolerate chemotherapy well and was therefore switched to [place].  However patient has had 3 hospitalizations since switching treatment.  Her hemoglobin  is 8.7 today and it would be unusual to get profound anemia with both [place] and Keytruda.  Thrombocytopenia is mild and currently stable   Visit Diagnosis 1. Acute respiratory failure with hypoxia ([place])   2. Acute on chronic congestive heart failure, unspecified heart failure type ([place])   3. Pulmonary embolus ([place])   4. Acute respiratory distress      Dr. Randa Evens, MD, MPH [facility] at [facility] [phone] 02/22/2020 3:27 PM

## 2020-02-22 NOTE — Progress Notes (Addendum)
Progress Note  Patient Name: Jaime Hall Date of Encounter: 02/22/2020  Primary Cardiologist: [name], MD   Subjective   Reports improving shortness of breath and that was able to lay flat all night.  Still remains on 2 L nasal cannula oxygen with no previous home oxygen.  She has had 2 episodes of hemoptysis since seen yesterday. One episode occurred last night with a trace amount of blood on tissue but overall clear phlegm.  During our conversation today, however, she coughed up a silver dollar amount of blood, which she reported was larger in amount than her episode last night.  No chest pain, racing heart rate, or palpitations.  Inpatient Medications    Scheduled Meds: . atorvastatin  10 mg Oral QPM  . carvedilol  3.125 mg Oral BID WC  . Chlorhexidine Gluconate Cloth  6 each Topical Daily  . cholecalciferol  1,000 Units Oral Daily  . [START ON 02/26/2020] cyanocobalamin  1,000 mcg Subcutaneous Q30 days  . DULoxetine  30 mg Oral Daily  . furosemide  40 mg Oral Daily  . guaiFENesin  600 mg Oral BID  . ipratropium-albuterol  3 mL Nebulization BID  . levothyroxine  62.5 mcg Oral QAC breakfast  . losartan  12.5 mg Oral Daily  . nystatin  1 application Topical QID  . pantoprazole  20 mg Oral Daily  . sodium chloride flush  3 mL Intravenous Q12H   Continuous Infusions: . sodium chloride     PRN Meds: sodium chloride, acetaminophen, ALPRAZolam, chlorpheniramine-HYDROcodone, LORazepam, morphine CONCENTRATE, sodium chloride flush   Vital Signs    Vitals:   02/21/20 2036 02/21/20 2136 02/22/20 0603 02/22/20 0824  BP: 134/65  139/75   Pulse: 98  (!) 106   Resp: 20  20   Temp: 97.8 F (36.6 C)  97.9 F (36.6 C)   TempSrc: Oral  Oral   SpO2: 99% 97% 99% 99%  Weight:      Height:        Intake/Output Summary (Last 24 hours) at 02/22/2020 0837 Last data filed at 02/22/2020 0511 Gross per 24 hour  Intake 120 ml  Output 2750 ml  Net -2630 ml   Last 3 Weights  02/21/2020 02/19/2020 02/19/2020  Weight (lbs) 158 lb 9.6 oz 158 lb 11.2 oz 156 lb  Weight (kg) 71.94 kg 71.986 kg 70.761 kg      Telemetry    NSR, ST, NSVT, PVCs, current rates 90s-100s - Personally Reviewed  ECG    No new tracings- Personally Reviewed  Physical Exam   GEN: No acute distress.  Pale.  Lying in bed watching television. Neck: No JVD Cardiac: tachycardic but regular, 1/6 systolic murmur.  No, rubs, or gallops.  Respiratory:  Bibasilar crackles, bilateral rhonchi. GI: Soft, nontender, non-distended  MS: No edema; No deformity. Neuro:  Nonfocal  Psych: Normal affect   Labs    High Sensitivity Troponin:   Recent Labs  Lab 02/14/20 0452 02/14/20 1200 02/19/20 2125 02/20/20 0025  TROPONINIHS 25* 26* 24* 30*      Chemistry Recent Labs  Lab 02/19/20 2125 02/19/20 2125 02/20/20 0411 02/21/20 0427 02/22/20 0519  NA 142   < > 142 138 141  K 3.3*   < > 3.2* 4.1 3.4*  CL 104   < > 101 101 101  CO2 25   < > '25 28 28  ' GLUCOSE 118*   < > 134* 142* 150*  BUN 26*   < > 25* 32* 32*  CREATININE 1.43*   < > 1.26* 1.34* 1.17*  CALCIUM 8.7*   < > 8.4* 8.2* 8.5*  PROT 6.4*  --   --   --   --   ALBUMIN 3.6  --   --   --   --   AST 31  --   --   --   --   ALT 18  --   --   --   --   ALKPHOS 63  --   --   --   --   BILITOT 2.3*  --   --   --   --   GFRNONAA 37*   < > 43* 40* 47*  GFRAA 43*   < > 50* 46* 55*  ANIONGAP 13   < > 16* 9 12   < > = values in this interval not displayed.     Hematology Recent Labs  Lab 02/20/20 0411 02/20/20 0411 02/20/20 1626 02/21/20 0427 02/22/20 0519  WBC 13.1*  --   --  7.5 10.6*  RBC 3.04*  --   --  2.94* 2.77*  HGB 9.7*   < > 9.5* 9.3* 8.7*  HCT 29.9*   < > 29.4* 29.1* 27.0*  MCV 98.4  --   --  99.0 97.5  MCH 31.9  --   --  31.6 31.4  MCHC 32.4  --   --  32.0 32.2  RDW 17.6*  --   --  17.6* 17.6*  PLT 127*  --   --  92* 120*   < > = values in this interval not displayed.    BNP Recent Labs  Lab 02/19/20 2125  02/20/20 0411  BNP 2,481.1* 5,573.2*     DDimer No results for input(s): DDIMER in the last 168 hours.   Radiology    US Venous Img Lower Bilateral (DVT)  Result Date: 02/20/2020 CLINICAL DATA:  PE EXAM: BILATERAL LOWER EXTREMITY VENOUS DOPPLER ULTRASOUND TECHNIQUE: Gray-scale sonography with compression, as well as color and duplex ultrasound, were performed to evaluate the deep venous system(s) from the level of the common femoral vein through the popliteal and proximal calf veins. COMPARISON:  None. FINDINGS: VENOUS Normal compressibility of the common femoral, superficial femoral, and popliteal veins, as well as the visualized calf veins (peroneal vein not well visualized on the left). Visualized portions of profunda femoral vein and great saphenous vein unremarkable. No filling defects to suggest DVT on grayscale or color Doppler imaging. Doppler waveforms show normal direction of venous flow, normal respiratory plasticity and response to augmentation. OTHER None. Limitations: none IMPRESSION: No evidence of DVT in the lower extremities. Electronically Signed   By: Macy Mis M.D.   On: 02/20/2020 12:08    Cardiac Studies    12/29/2019 Echo 1. Left ventricular ejection fraction, by estimation, is 30 to 35%. The  left ventricle has moderately decreased function. The left ventricle  demonstrates global hypokinesis. Left ventricular diastolic parameters are  consistent with Grade I diastolic  dysfunction (impaired relaxation).  2. Right ventricular systolic function is normal. The right ventricular  size is normal. There is mildly elevated pulmonary artery systolic  pressure. The estimated right ventricular systolic pressure is 20.2 mmHg.  3. The mitral valve is normal in structure. Mild mitral valve  regurgitation.  4. The aortic valve is normal in structure. Aortic valve regurgitation is  mild.   12/04/2019 R/LHC  There is moderate to severe left ventricular systolic  dysfunction.  LV end diastolic pressure  is moderately elevated.  Previously placed Prox LAD stent (unknown type) is widely patent.  Ost LAD to Prox LAD lesion is 40% stenosed. 1. Patent LAD stent with no significant restenosis. There is moderate stenosis in ostial LAD before the stent. No evidence of obstructive disease overall. 2. Moderately to severely reduced LV systolic function with an EF of 30 to 35%. 3. Right heart catheterization showed mildly elevated filling pressures with pulmonary capillary wedge pressure of18 mmHg, mild to moderate pulmonary hypertension at 44/19 with a mean of 33 mmHg, and mildly reduced cardiac output at 3.89 with a cardiac index of 2.24. Pulmonary vascular resistance is 3.8 Woods units Recommendations: Continue medical therapy for coronary artery disease. 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 She is mildly volume overloaded and I added small dose furosemide. Recommend medical therapy for cardiomyopathy.  11/02/2019 Echo 1. Left ventricular ejection fraction, by estimation, is30 to 35%. The  left ventricle has moderately decreased function. The left ventricle  demonstrates global hypokinesis. Left ventricular diastolic parameters are  consistent with Grade I diastolic dysfunction (impaired relaxation).  2. Right ventricular systolic function is normal. The right ventricular  size is normal. There is mildly elevated pulmonary artery systolic  pressure. The estimated right ventricular systolic pressure PI95.1 mmHg. 3. Mild to moderate mitral valve regurgitation.  4. Aortic valve regurgitation is mild to moderate.   Patient Profile     69 y.o. 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   Assessment & Plan    Acute on chronic HFrEF Pulmonary HTN --Improving shortness of breath; however, remains on 2L  oxygen and does not use home oxygen. She has not yet ambulated. She can lay flat if using O2. SOB multifactorial in the setting of her anemia, bilateral PE, and acute on chronic HFrEF. Exacerbation likely 2/2 receiving IVF at previous admission with lasix held in setting of reduced EF/CO. --Previous echo as above with EF 30 to 35%, G1 DD, mild MR, AR. --Volume up on exam. --Transitioned from IV lasix yesterday to oral lasix at 70m daily after bump in Cr with improvement in Cr overnight. Still volume up on exam though improving volume status with significant output yesterday of net -2.7 L output yesterday.  Weight 72 kg  71.9kg.  --Continue diuresis for net output 2L daily or until euvolemic on exam. If output goals not met, could consider transition back to IV lasix as renal function allows.  --Continue to monitor I's/O's, daily weights.   --Daily BMET. Hypokalemic with K 3.4. Replete with goal 4.0. Monitor Mg with goal 2.0. Renal function stable with Cr 1.34  1.17, BUN 32. --Continue Coreg, Cozaaar with above diuresis. Wean oxygen as tolerated. As above, she does not use O2 at home.   Hypokalemia --Replete with goal 4.0.   Elevated high-sensitivity troponin CAD s/p stenting and angioplasty --No CP.HS Tn minimally elevated and flat trending.  --Suspect HS Tn elevated 2/2supply demand ischemiain the setting of her comorbid conditions, includingcurrentpulmonary embolism, NSVT,AKI,suspected pna / sepsis, lymphoma withcurrent chemotherapy,anemiapulmonary HTN, low output HFwith known reduced CO,valular dz.6/2021LHC as above.S/p DES to pLAD with R/LHC as above with NICM thought 2/2 chemotherapy Previous echo as above with  EF 30-35%and RVSP  36.54mHg. --At this time, no indication forfurther ischemic workupwith cardiac cathterization. --Continue medical management, aggressive risk factor modification.  Pulmonary embolism --Seen on previous imagining as CTA showing acute segmental emboli to the right lower lobe. [facility]. Due to hemoptysis and anemia/thrombocytopenia, OAC and ASA on hold. Continue to hold.   Lower extremity Dopplers negative for DVT.  As below, discussion ongoing with patient regarding IVC filter placement.  Hemoptysis, anemia --Hemoptysis had resolved; however, as of last night 2 additional episodes reported last night and this AM as above. Defer management to IM/pulmonology. Continue to hold Eliquis, ASA. LE doppler negative for DVT.  Per internal medicine, discussion ongoing regarding IVC filter placement.  NSVT --NSVT as seen on telemetry, in addition to ST and NSR with PVCs. --Maintain electrolytes at goal to reduce risk of arrhythmia.Likely multifactorial given her comorbid conditions.  --Continue rate control withCoreg and escalateas BP allows.Consider transition to metoprolol for further rate control given elevated rates.   Hypothyroidism --Per IM, PCP.  Continue Synthroid.  History oforthostasis Hypertension --Current BP stable. History of soft BP and orthostasis with diuresis. Continue current low-dose Coregand ARB with  diuresis and adjust as needed / outlined above to avoid prerenal AKI.  Valvular heart dz:  Mild to moderate MR/AR --Updatedecho as above with stable MR/AR.Continue to monitorwith periodic outpatient echos.   Anemia, thrombocytopenia --Daily CBC.Transfuse below 8.0. Current hemoglobin 8.7 with platelets improved from 92  120.  HLD --Continue statin.    For questions or updates, please contact C[place]tonPlease consult [email or web address] for contact info under        Signed, JArvil Chaco PA-C  02/22/2020, 8:37 AM

## 2020-02-22 NOTE — Telephone Encounter (Signed)
Patient was scheduled for appt on this date; however, patient was admitted to the hospital. Writer was asked to reschedule lab and see MD appts to 02-27-20. Appts rescheduled per request.

## 2020-02-23 ENCOUNTER — Inpatient Hospital Stay: Payer: PPO | Admitting: Internal Medicine

## 2020-02-23 ENCOUNTER — Telehealth: Payer: Self-pay

## 2020-02-23 DIAGNOSIS — I080 Rheumatic disorders of both mitral and aortic valves: Secondary | ICD-10-CM | POA: Diagnosis not present

## 2020-02-23 DIAGNOSIS — I2699 Other pulmonary embolism without acute cor pulmonale: Secondary | ICD-10-CM | POA: Diagnosis not present

## 2020-02-23 DIAGNOSIS — D696 Thrombocytopenia, unspecified: Secondary | ICD-10-CM | POA: Diagnosis not present

## 2020-02-23 DIAGNOSIS — I130 Hypertensive heart and chronic kidney disease with heart failure and stage 1 through stage 4 chronic kidney disease, or unspecified chronic kidney disease: Secondary | ICD-10-CM | POA: Diagnosis not present

## 2020-02-23 DIAGNOSIS — J9601 Acute respiratory failure with hypoxia: Secondary | ICD-10-CM | POA: Diagnosis not present

## 2020-02-23 DIAGNOSIS — C541 Malignant neoplasm of endometrium: Secondary | ICD-10-CM | POA: Diagnosis not present

## 2020-02-23 DIAGNOSIS — E785 Hyperlipidemia, unspecified: Secondary | ICD-10-CM | POA: Diagnosis not present

## 2020-02-23 DIAGNOSIS — D631 Anemia in chronic kidney disease: Secondary | ICD-10-CM | POA: Diagnosis not present

## 2020-02-23 DIAGNOSIS — I2510 Atherosclerotic heart disease of native coronary artery without angina pectoris: Secondary | ICD-10-CM | POA: Diagnosis not present

## 2020-02-23 DIAGNOSIS — N189 Chronic kidney disease, unspecified: Secondary | ICD-10-CM | POA: Diagnosis not present

## 2020-02-23 DIAGNOSIS — E876 Hypokalemia: Secondary | ICD-10-CM | POA: Diagnosis not present

## 2020-02-23 DIAGNOSIS — N179 Acute kidney failure, unspecified: Secondary | ICD-10-CM | POA: Diagnosis not present

## 2020-02-23 DIAGNOSIS — I5043 Acute on chronic combined systolic (congestive) and diastolic (congestive) heart failure: Secondary | ICD-10-CM | POA: Diagnosis not present

## 2020-02-23 NOTE — Telephone Encounter (Signed)
Pt called and scheduled appt for 02/29/20

## 2020-02-23 NOTE — Telephone Encounter (Signed)
Unable to reach patient for transition of care. No answer. Discharged 02/22/20. [facility]fu scheduled 02/23/20. Keep all scheduled appointments.

## 2020-02-25 LAB — CULTURE, BLOOD (SINGLE)
Culture: NO GROWTH
Special Requests: ADEQUATE

## 2020-02-26 ENCOUNTER — Telehealth: Payer: Self-pay | Admitting: *Deleted

## 2020-02-26 ENCOUNTER — Other Ambulatory Visit: Payer: Self-pay | Admitting: *Deleted

## 2020-02-26 ENCOUNTER — Telehealth: Payer: Self-pay | Admitting: Internal Medicine

## 2020-02-26 DIAGNOSIS — C541 Malignant neoplasm of endometrium: Secondary | ICD-10-CM

## 2020-02-26 NOTE — Telephone Encounter (Signed)
[facility] called requesting a verbal order for patient for skill nursing :1 week -1 2 week 3 1 week 1 for  Congestive heart failure Contact number [phone]

## 2020-02-26 NOTE — Telephone Encounter (Signed)
Patient has paperwork for work she is going to leave at the front desk.

## 2020-02-26 NOTE — Telephone Encounter (Signed)
I got it and I will work on her FMLA papers

## 2020-02-26 NOTE — Telephone Encounter (Signed)
Verbal orders given  

## 2020-02-27 ENCOUNTER — Encounter: Payer: Self-pay | Admitting: Oncology

## 2020-02-27 ENCOUNTER — Ambulatory Visit: Payer: PPO | Admitting: Oncology

## 2020-02-27 ENCOUNTER — Other Ambulatory Visit: Payer: Self-pay | Admitting: *Deleted

## 2020-02-27 ENCOUNTER — Other Ambulatory Visit: Payer: PPO

## 2020-02-27 ENCOUNTER — Inpatient Hospital Stay: Payer: PPO | Attending: Oncology

## 2020-02-27 ENCOUNTER — Inpatient Hospital Stay (HOSPITAL_BASED_OUTPATIENT_CLINIC_OR_DEPARTMENT_OTHER): Payer: PPO | Admitting: Oncology

## 2020-02-27 ENCOUNTER — Other Ambulatory Visit: Payer: Self-pay

## 2020-02-27 VITALS — BP 90/51 | HR 87 | Temp 96.3°F | Resp 16 | Wt 152.4 lb

## 2020-02-27 DIAGNOSIS — Z800 Family history of malignant neoplasm of digestive organs: Secondary | ICD-10-CM | POA: Insufficient documentation

## 2020-02-27 DIAGNOSIS — Z923 Personal history of irradiation: Secondary | ICD-10-CM | POA: Diagnosis not present

## 2020-02-27 DIAGNOSIS — N1831 Chronic kidney disease, stage 3a: Secondary | ICD-10-CM | POA: Diagnosis not present

## 2020-02-27 DIAGNOSIS — Z888 Allergy status to other drugs, medicaments and biological substances status: Secondary | ICD-10-CM | POA: Insufficient documentation

## 2020-02-27 DIAGNOSIS — C541 Malignant neoplasm of endometrium: Secondary | ICD-10-CM

## 2020-02-27 DIAGNOSIS — Z820 Family history of epilepsy and other diseases of the nervous system: Secondary | ICD-10-CM | POA: Insufficient documentation

## 2020-02-27 DIAGNOSIS — Z7189 Other specified counseling: Secondary | ICD-10-CM

## 2020-02-27 DIAGNOSIS — I130 Hypertensive heart and chronic kidney disease with heart failure and stage 1 through stage 4 chronic kidney disease, or unspecified chronic kidney disease: Secondary | ICD-10-CM | POA: Insufficient documentation

## 2020-02-27 DIAGNOSIS — Z66 Do not resuscitate: Secondary | ICD-10-CM | POA: Diagnosis not present

## 2020-02-27 DIAGNOSIS — M1990 Unspecified osteoarthritis, unspecified site: Secondary | ICD-10-CM | POA: Insufficient documentation

## 2020-02-27 DIAGNOSIS — Z79899 Other long term (current) drug therapy: Secondary | ICD-10-CM | POA: Diagnosis not present

## 2020-02-27 DIAGNOSIS — Z90710 Acquired absence of both cervix and uterus: Secondary | ICD-10-CM | POA: Insufficient documentation

## 2020-02-27 DIAGNOSIS — F419 Anxiety disorder, unspecified: Secondary | ICD-10-CM | POA: Insufficient documentation

## 2020-02-27 DIAGNOSIS — R0602 Shortness of breath: Secondary | ICD-10-CM | POA: Diagnosis not present

## 2020-02-27 DIAGNOSIS — Z833 Family history of diabetes mellitus: Secondary | ICD-10-CM | POA: Insufficient documentation

## 2020-02-27 DIAGNOSIS — R1031 Right lower quadrant pain: Secondary | ICD-10-CM | POA: Diagnosis not present

## 2020-02-27 DIAGNOSIS — Z7901 Long term (current) use of anticoagulants: Secondary | ICD-10-CM | POA: Insufficient documentation

## 2020-02-27 DIAGNOSIS — Z9049 Acquired absence of other specified parts of digestive tract: Secondary | ICD-10-CM | POA: Insufficient documentation

## 2020-02-27 DIAGNOSIS — J9601 Acute respiratory failure with hypoxia: Secondary | ICD-10-CM | POA: Insufficient documentation

## 2020-02-27 DIAGNOSIS — Z881 Allergy status to other antibiotic agents status: Secondary | ICD-10-CM | POA: Insufficient documentation

## 2020-02-27 DIAGNOSIS — Z807 Family history of other malignant neoplasms of lymphoid, hematopoietic and related tissues: Secondary | ICD-10-CM | POA: Insufficient documentation

## 2020-02-27 DIAGNOSIS — Z8759 Personal history of other complications of pregnancy, childbirth and the puerperium: Secondary | ICD-10-CM | POA: Insufficient documentation

## 2020-02-27 DIAGNOSIS — Z882 Allergy status to sulfonamides status: Secondary | ICD-10-CM | POA: Insufficient documentation

## 2020-02-27 DIAGNOSIS — I509 Heart failure, unspecified: Secondary | ICD-10-CM | POA: Diagnosis not present

## 2020-02-27 DIAGNOSIS — I2699 Other pulmonary embolism without acute cor pulmonale: Secondary | ICD-10-CM | POA: Diagnosis not present

## 2020-02-27 DIAGNOSIS — Z823 Family history of stroke: Secondary | ICD-10-CM | POA: Insufficient documentation

## 2020-02-27 DIAGNOSIS — T451X5A Adverse effect of antineoplastic and immunosuppressive drugs, initial encounter: Secondary | ICD-10-CM | POA: Diagnosis not present

## 2020-02-27 DIAGNOSIS — R000 Tachycardia, unspecified: Secondary | ICD-10-CM | POA: Insufficient documentation

## 2020-02-27 DIAGNOSIS — Z90722 Acquired absence of ovaries, bilateral: Secondary | ICD-10-CM | POA: Insufficient documentation

## 2020-02-27 DIAGNOSIS — Z803 Family history of malignant neoplasm of breast: Secondary | ICD-10-CM | POA: Insufficient documentation

## 2020-02-27 DIAGNOSIS — R5383 Other fatigue: Secondary | ICD-10-CM | POA: Diagnosis not present

## 2020-02-27 DIAGNOSIS — D649 Anemia, unspecified: Secondary | ICD-10-CM | POA: Insufficient documentation

## 2020-02-27 DIAGNOSIS — Z170 Estrogen receptor positive status [ER+]: Secondary | ICD-10-CM | POA: Diagnosis not present

## 2020-02-27 DIAGNOSIS — R918 Other nonspecific abnormal finding of lung field: Secondary | ICD-10-CM | POA: Insufficient documentation

## 2020-02-27 DIAGNOSIS — Z87891 Personal history of nicotine dependence: Secondary | ICD-10-CM | POA: Insufficient documentation

## 2020-02-27 DIAGNOSIS — Z8571 Personal history of Hodgkin lymphoma: Secondary | ICD-10-CM | POA: Diagnosis not present

## 2020-02-27 DIAGNOSIS — I2510 Atherosclerotic heart disease of native coronary artery without angina pectoris: Secondary | ICD-10-CM | POA: Insufficient documentation

## 2020-02-27 DIAGNOSIS — G620 Drug-induced polyneuropathy: Secondary | ICD-10-CM | POA: Diagnosis not present

## 2020-02-27 DIAGNOSIS — C786 Secondary malignant neoplasm of retroperitoneum and peritoneum: Secondary | ICD-10-CM | POA: Diagnosis not present

## 2020-02-27 LAB — CBC WITH DIFFERENTIAL/PLATELET
Abs Immature Granulocytes: 0.01 10*3/uL (ref 0.00–0.07)
Basophils Absolute: 0 10*3/uL (ref 0.0–0.1)
Basophils Relative: 0 %
Eosinophils Absolute: 0.1 10*3/uL (ref 0.0–0.5)
Eosinophils Relative: 2 %
HCT: 25.8 % — ABNORMAL LOW (ref 36.0–46.0)
Hemoglobin: 8.2 g/dL — ABNORMAL LOW (ref 12.0–15.0)
Immature Granulocytes: 0 %
Lymphocytes Relative: 10 %
Lymphs Abs: 0.5 10*3/uL — ABNORMAL LOW (ref 0.7–4.0)
MCH: 31.3 pg (ref 26.0–34.0)
MCHC: 31.8 g/dL (ref 30.0–36.0)
MCV: 98.5 fL (ref 80.0–100.0)
Monocytes Absolute: 0.3 10*3/uL (ref 0.1–1.0)
Monocytes Relative: 6 %
Neutro Abs: 4 10*3/uL (ref 1.7–7.7)
Neutrophils Relative %: 82 %
Platelets: 114 10*3/uL — ABNORMAL LOW (ref 150–400)
RBC: 2.62 MIL/uL — ABNORMAL LOW (ref 3.87–5.11)
RDW: 17.1 % — ABNORMAL HIGH (ref 11.5–15.5)
WBC: 4.9 10*3/uL (ref 4.0–10.5)
nRBC: 0 % (ref 0.0–0.2)

## 2020-02-27 LAB — COMPREHENSIVE METABOLIC PANEL
ALT: 13 U/L (ref 0–44)
AST: 20 U/L (ref 15–41)
Albumin: 2.8 g/dL — ABNORMAL LOW (ref 3.5–5.0)
Alkaline Phosphatase: 52 U/L (ref 38–126)
Anion gap: 10 (ref 5–15)
BUN: 18 mg/dL (ref 8–23)
CO2: 31 mmol/L (ref 22–32)
Calcium: 8 mg/dL — ABNORMAL LOW (ref 8.9–10.3)
Chloride: 101 mmol/L (ref 98–111)
Creatinine, Ser: 1.27 mg/dL — ABNORMAL HIGH (ref 0.44–1.00)
GFR calc Af Amer: 50 mL/min — ABNORMAL LOW (ref >60)
GFR calc non Af Amer: 43 mL/min — ABNORMAL LOW (ref >60)
Glucose, Bld: 122 mg/dL — ABNORMAL HIGH (ref 70–99)
Potassium: 3.3 mmol/L — ABNORMAL LOW (ref 3.5–5.1)
Sodium: 142 mmol/L (ref 135–145)
Total Bilirubin: 2.6 mg/dL — ABNORMAL HIGH (ref 0.3–1.2)
Total Protein: 5.7 g/dL — ABNORMAL LOW (ref 6.5–8.1)

## 2020-02-27 MED ORDER — MORPHINE SULFATE (CONCENTRATE) 10 MG /0.5 ML PO SOLN: 5.0000 mg | Freq: Four times a day (QID) | ORAL | 0 refills | Status: AC | PRN

## 2020-02-27 NOTE — Telephone Encounter (Signed)
Unable to reach patient for transition of care. No answer. Will follow as appropriate.

## 2020-02-28 ENCOUNTER — Ambulatory Visit: Payer: PPO | Admitting: Family

## 2020-02-28 ENCOUNTER — Encounter: Payer: Self-pay | Admitting: Family

## 2020-02-28 ENCOUNTER — Other Ambulatory Visit: Payer: Self-pay

## 2020-02-28 VITALS — BP 94/54 | HR 74 | Ht 62.0 in | Wt 152.2 lb

## 2020-02-28 DIAGNOSIS — E782 Mixed hyperlipidemia: Secondary | ICD-10-CM

## 2020-02-28 DIAGNOSIS — N1832 Chronic kidney disease, stage 3b: Secondary | ICD-10-CM

## 2020-02-28 DIAGNOSIS — R0600 Dyspnea, unspecified: Secondary | ICD-10-CM | POA: Diagnosis not present

## 2020-02-28 DIAGNOSIS — R0609 Other forms of dyspnea: Secondary | ICD-10-CM

## 2020-02-28 DIAGNOSIS — I38 Endocarditis, valve unspecified: Secondary | ICD-10-CM

## 2020-02-28 DIAGNOSIS — I5022 Chronic systolic (congestive) heart failure: Secondary | ICD-10-CM

## 2020-02-28 DIAGNOSIS — E876 Hypokalemia: Secondary | ICD-10-CM | POA: Diagnosis not present

## 2020-02-28 MED ORDER — POTASSIUM CHLORIDE ER 10 MEQ PO TBCR: 10.0000 meq | EXTENDED_RELEASE_TABLET | Freq: Every day | ORAL | 1 refills | Status: DC

## 2020-02-28 NOTE — Progress Notes (Signed)
Office Visit    Patient Name: Jaime Hall Date of Encounter: 02/28/2020  Primary Care Provider:  [name], MD Primary Cardiologist:  [name], MD Electrophysiologist:  None   Chief Complaint    Jaime Hall is a 69 y.o. female with a hx of CAD, Hodgkin's lymphoma, HFrEF, uterine cancer, PE presents today for follow-up after hospitalization.   Past Medical History    Past Medical History:  Diagnosis Date  . Anxiety   . Cervicalgia   . CHF (congestive heart failure) ([place])   . Chronic kidney disease   . Coronary artery disease    a. 02/2012 Stress echo: severe anterior wall ischemia;  b. 02/2012 Cath/PCI: LAD 95p (3.0 x 15 Xience EX DES), D1 90ost (PTCA - bifurcational dzs), EF 45% with anterior HK;  b. 02/2013 Ex MV: fixed anterior defect w/ minor reversibility, nl EF-->Med Rx.  . Endometrial cancer ([place])    a. 07/2016 s/p robotic hysterectomy, BSO w/ washings, sentinel node inj, mapping, bx, adhesiolysis.  . Essential hypertension, benign   . Fibrocystic breast disease   . GERD (gastroesophageal reflux disease)   . Gestational hypertension   . Heart murmur   . History of anemia   . History of blood transfusion   . Hodgkin's lymphoma ([place]) 2011   a. s/p radiation and chemo therapy  . Osteoarthritis   . Polycystic ovarian disease    Past Surgical History:  Procedure Laterality Date  . ABDOMINAL HYSTERECTOMY    . bladder sling    . CARDIAC CATHETERIZATION  02/2012   [facility] 1 stent place  . CERVICAL POLYPECTOMY    . CHOLECYSTECTOMY  1982  . COLONOSCOPY WITH PROPOFOL N/A 02/05/2015   Procedure: COLONOSCOPY WITH PROPOFOL;  Surgeon: Lucilla Lame, MD;  Location: [facility];  Service: Endoscopy;  Laterality: N/A;  . CORONARY ANGIOPLASTY  02/2012   left/right s/p balloon  . CYSTOGRAM N/A 08/17/2016   Procedure: CYSTOGRAM;  Surgeon: Hollice Espy, MD;  Location: [facility];  Service: Urology;  Laterality: N/A;  . CYSTOSCOPY N/A 08/17/2016   Procedure:  CYSTOSCOPY EXAM UNDER ANESTHESIA;  Surgeon: Hollice Espy, MD;  Location: [facility];  Service: Urology;  Laterality: N/A;  . CYSTOSCOPY W/ RETROGRADES Bilateral 08/17/2016   Procedure: CYSTOSCOPY WITH RETROGRADE PYELOGRAM;  Surgeon: Hollice Espy, MD;  Location: [facility];  Service: Urology;  Laterality: Bilateral;  . CYSTOSCOPY WITH STENT PLACEMENT Right 08/17/2016   Procedure: CYSTOSCOPY WITH STENT PLACEMENT;  Surgeon: Hollice Espy, MD;  Location: [facility];  Service: Urology;  Laterality: Right;  . heart stent'  2013  . kidney stent Right 2018  . LYMPH NODE BIOPSY  2011   diagnosis of hodgkins lymphoma  . PELVIC LYMPH NODE DISSECTION N/A 07/29/2016   Procedure: PELVIC/AORTIC LYMPH NODE SAMPLING;  Surgeon: [name], MD;  Location: [facility];  Service: Gynecology;  Laterality: N/A;  . PORTA CATH INSERTION N/A 09/22/2016   Procedure: Glori Luis Cath Insertion;  Surgeon: [name], MD;  Location: [place] [facility];  Service: Cardiovascular;  Laterality: N/A;  . PORTA CATH INSERTION N/A 10/27/2019   Procedure: PORTA CATH INSERTION;  Surgeon: [name], MD;  Location: [place] [facility];  Service: Cardiovascular;  Laterality: N/A;  . PORTA CATH REMOVAL N/A 11/17/2016   Procedure: Glori Luis Cath Removal;  Surgeon: [name], MD;  Location: [place] [facility];  Service: Cardiovascular;  Laterality: N/A;  . RIGHT/LEFT HEART CATH AND CORONARY ANGIOGRAPHY N/A 12/04/2019   Procedure: RIGHT/LEFT HEART  CATH AND CORONARY ANGIOGRAPHY;  Surgeon: Wellington Hampshire, MD;  Location: [place] [facility];  Service: Cardiovascular;  Laterality: N/A;  . ROBOTIC ASSISTED TOTAL HYSTERECTOMY WITH BILATERAL SALPINGO OOPHERECTOMY N/A 07/29/2016   Procedure: ROBOTIC ASSISTED TOTAL HYSTERECTOMY WITH BILATERAL SALPINGO OOPHORECTOMY;  Surgeon: [name], MD;  Location: [facility];  Service: Gynecology;  Laterality: N/A;  . SENTINEL NODE BIOPSY N/A 07/29/2016   Procedure: SENTINEL NODE  BIOPSY;  Surgeon: [name], MD;  Location: [facility];  Service: Gynecology;  Laterality: N/A;  . transobturator sling N/A 2009   [place]   Allergies Allergies  Allergen Reactions  . Adhesive [Tape] Other (See Comments)    Skin Irritation   . Antifungal [Miconazole Nitrate] Rash  . Sulfa Antibiotics Nausea And Vomiting and Rash  . Z-Pak [Azithromycin] Itching    History of Present Illness    Jaime Hall is a 69 y.o. female with a hx of CAD (prior stent to LAD), Hodgkin's lymphoma, PE, uterine cancer, endometrial cancer, HFrEF. She was last seen while hospitalized.  History of Hodgkin's lymphoma treated with radiation and chemotherapy to the chest area previously in remission since 2011.  Uterine cancer in 2018 treated with surgery and chemotherapy.  She is a former smoker.  Her CAD is s/p angioplasty and DES to proximal LAD with balloon angioplasty to the diagonal September 2013.  Treadmill nuclear stress test September 2014 fixed anterior wall defect with minor reversibility and normal LVEF.   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 Seen in clinic 11/02/19 for clearance for chemotherapy. Echo 11/02/19 showed LVEF 30-35%, gr1DD, RV normal size and function, mildly elevated PASP, mild-moderate MR/AI. 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  Dr. Janese Banks, her oncologist, did change her chemotherapy agent due to the newly diagnosed heart failure. Subsequent stress test to rule out ischemic cardiomyopathy was high risk. R/LHC performed 12/04/19 with LVEF 30-35%, LVEDP moderately elevated, previous LAD stent patent, ost-prox LAD 40%. She was started on Lasix 20mg  daily. This was reduced to PRN due to orthostasis.   Presented to ED in [facility] after 1 week history of SOB and orthopnea she took her PRN Lsix but had vomitinga  nd continues to void - she was treated with IV diuresis. Echo 01/26/20 at outside facility EF 25-30%, severe global hypokinesis, RV normal size/function, moderate MR, mild AI without aortic stenosis. CT chest with no PE, pulmonary edema with small bilateral pleural effusions, soft tissue thickening bronchiectasis likely scar/post radiation changes.   Seen in clinic 02/05/20. She was taking Lasix 20 mg daily.  She had marginally low potassium 02/01/2020 of 3.4.  She had transitioned from chemotherapy to immunotherapy with oncology and noted more nausea and vomiting. She was interested in pursuing optimal GDMT for HF and case discussed with Dr. [name]. Her Metoprolol was discontinued and Coreg 3.125mg  BID and Losartan 12.5mg  daily started.   Subsequent admission 02/14/20-02/16/20 for worsening shortness of breath diagnosed with acute segmental PE in RLL treated with IV heparin and transitioned to Eliquis. She was readmitted 02/19/20 - 02/22/20 with acute respiratory distress secondary to acute on chronic HFrEF and hemoptysis. She was transitioned to Lasix 40mg  daily. Her Lipitor, Losartan, Coreg were continued. Due to her hemoptysis her Eliquis was discontinued. Her Levothyroxine dose was increase with recommendation for repeat thyroid function studies in 4-6 weeks. She was discharged with home health and  oxygen as well as Lasix 40mg  daily.   Presents today with her sister-in-law for follow-up.  She has been taking Lasix 20 mg daily since discharge.  Reports no increased lower extremity edema.  Her weight is down 2 pounds compared to clinic visit approximately 3 weeks ago.  Reports no chest pain, pressure, tightness.  Reports no shortness of breath at rest.  Endorses her dyspnea on exertion is stable.  She saw Dr. Janese Banks of oncology yesterday with plan to hold off on reinitiation of anticoagulation for 2 weeks and hold off on her immunotherapy.  Tells me she is feeling much better 1 on the immunotherapy not having nausea or  vomiting.  She is taking a break from work to focus on continuing to feel better  EKGs/Labs/Other Studies Reviewed:   The following studies were reviewed today: Echo 11/02/19 IMPRESSIONS    1. Left ventricular ejection fraction, by estimation, is 30 to 35%. The  left ventricle has moderately decreased function. The left ventricle  demonstrates global hypokinesis. Left ventricular diastolic parameters are  consistent with Grade I diastolic  dysfunction (impaired relaxation).   2. Right ventricular systolic function is normal. The right ventricular  size is normal. There is mildly elevated pulmonary artery systolic  pressure. The estimated right ventricular systolic pressure is 62.2 mmHg.   3. Mild to moderate mitral valve regurgitation.   4. Aortic valve regurgitation is mild to moderate.   EKG:  EKG is ordered today.  The ekg ordered today demonstrates 74 bpm with nonspecific intraventricular block and stalbe T wave abnormality and lead II, V5, V6.    Recent Labs: 02/20/2020: B Natriuretic Peptide 3,068.3; TSH 11.200 02/21/2020: Magnesium 2.1 02/27/2020: ALT 13; BUN 18; Creatinine, Ser 1.27; Hemoglobin 8.2; Platelets 114; Potassium 3.3; Sodium 142  Recent Lipid Panel    Component Value Date/Time   CHOL 114 12/30/2019 0457   CHOL 110 04/11/2012 0835   TRIG 131 12/30/2019 0457   HDL 47 12/30/2019 0457   HDL 56 04/11/2012 0835   CHOLHDL 2.4 12/30/2019 0457   VLDL 26 12/30/2019 0457   LDLCALC 41 12/30/2019 0457   LDLCALC 41 04/11/2012 0835    Home Medications   Current Meds  Medication Sig  . atorvastatin (LIPITOR) 10 MG tablet Take 1 tablet by mouth once daily (Patient taking differently: Take 10 mg by mouth every evening. )  . carvedilol (COREG) 3.125 MG tablet Take 1 tablet (3.125 mg total) by mouth 2 (two) times daily with a meal.  . cholecalciferol (VITAMIN D3) 25 MCG (1000 UNIT) tablet Take 1,000 Units by mouth in the morning and at bedtime.  . cyanocobalamin (,VITAMIN  B-12,) 1000 MCG/ML injection INJECT 1ML IM WEEKLY FOR 4 WEEKS, THEN INJECT MONTHLY THEREAFTER (Patient taking differently: Inject 1,000 mcg into the skin every 30 (thirty) days. )  . DULoxetine (CYMBALTA) 30 MG capsule Take 1 capsule (30 mg total) by mouth daily.  . furosemide (LASIX) 20 MG tablet Take 1 tablet (20 mg total) by mouth as needed for fluid or edema (Take one tablet daily as needed for lower extremity edema or for weight gain of 2 lbs overnight or 5 lbs in one week.). (Patient taking differently: Take 20 mg by mouth daily. )  . levothyroxine (SYNTHROID) 75 MCG tablet Take 1 tablet (75 mcg total) by mouth daily before breakfast.  . LORazepam (ATIVAN) 0.5 MG tablet Take 1 tablet (0.5 mg total) by mouth every 6 (six) hours as needed for anxiety (and to help with breathing).  Marland Kitchen  losartan (COZAAR) 25 MG tablet Take 0.5 tablets (12.5 mg total) by mouth daily.  . Morphine Sulfate (MORPHINE CONCENTRATE) 10 mg / 0.5 ml concentrated solution Take 0.25 mLs (5 mg total) by mouth every 6 (six) hours as needed for severe pain or shortness of breath.  . pantoprazole (PROTONIX) 20 MG tablet Take 1 tablet (20 mg total) by mouth daily.  . [DISCONTINUED] prochlorperazine (COMPAZINE) 10 MG tablet Take 1 tablet (10 mg total) by mouth every 6 (six) hours as needed (Nausea or vomiting). (Patient taking differently: Take 10 mg by mouth daily as needed for nausea or vomiting. )    Review of Systems    Review of Systems  Constitutional: Positive for malaise/fatigue. Negative for chills and fever.  Cardiovascular: Positive for dyspnea on exertion. Negative for chest pain, irregular heartbeat, leg swelling, near-syncope, orthopnea, palpitations and syncope.  Respiratory: Negative for cough, shortness of breath and wheezing.   Gastrointestinal: Negative for melena, nausea and vomiting.  Genitourinary: Negative for hematuria.  Neurological: Negative for dizziness, light-headedness and weakness.   All other systems  reviewed and are otherwise negative except as noted above.  Physical Exam   VS:  BP (!) 94/54 (BP Location: Left Arm, Patient Position: Sitting, Cuff Size: Normal)   Pulse 74   Ht 5\' 2"  (1.575 m)   Wt 152 lb 3.2 oz (69 kg)   SpO2 99% Comment: 2 Liters of Oxygen  BMI 27.84 kg/m  , BMI Body mass index is 27.84 kg/m. GEN: Well nourished, well developed, in no acute distress. HEENT: normal. Neck: Supple, no JVD, carotid bruits, or masses. Cardiac: RRR, no murmurs, rubs, or gallops. No clubbing, cyanosis.  Trace bilateral pedal edema.  Radials/DP/PT 2+ and equal bilaterally.  Respiratory:  Respirations regular and unlabored, clear to auscultation bilaterally. GI: Soft, nontender, nondistended, BS + x 4. MS: No deformity or atrophy. Skin: Warm and dry, no rash. Neuro:  Strength and sensation are intact. Psych: Normal affect.  Assessment & Plan   1. CAD - Remote stent LAD 2013. Cath 12/04/19 with patent stent and ost-lad 40% stenosed. Continue GDMT including beta blocker, statin. No aspirin secondary to hemoptysis.  EKG today with no acute ST/T wave changes.  Due to recent cath and no anginal symptoms, no indication for ischemic evaluation at this time.  2. HLD, HLD goal <70 - Continue Atorvastatin 10mg  daily.   3. HTN - BP low normal today.  Asymptomatic with no lightheadedness, dizziness, near-syncope, syncope.  Continue carvedilol 3.125 mg twice daily, losartan 12.5 mg daily, Lasix 20 mg daily.  4. HFrEF - Echo 11/02/19 as part of clearance for chemo revealed LVEF 30-35% and gr1DD. Subsequent R/LHC with no ischemia. 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  Recent ED visit in [facility] requiring IV diuresis with CT scan showing small bilateral pleural effusions and echo with EF 25-30%. Unclear whether true decline in EF versus difference in reading physician. NYHA II.  Trace pedal edema on exam today, otherwise euvolemic and well compensated..  Present GDMT includes Coreg  3.25 mg twice daily, losartan 12.5 mg daily, Lasix 20 mg daily.  Defer addition of MRA due to relative hypotension.  Continue low sodium diet and limiting to 2L fluid.    5. CKD3b -lab work 02/27/2020 with creatinine 1.27, GFR 43 overall stable compared to previous..  Careful titration of diuretics, antihypertensives, and heart failure therapies.  Continue to follow with nephrology.  6. Hypokalemia -likely due to diuresis.  Potassium 02/27/2019 1K3.3.  Start potassium 10  mEq daily.  She has repeat lab work cancer center in 2 weeks.  7. PE - Diagnosed 02/2020. Off anticoagulation in setting of hemoptysis.  Defer reinitiation to Dr. Janese Banks.  Reports no recurrent hemoptysis.  Encouraged her to discuss with [place] in the future if she needs assistance with patient assistance paperwork or samples.  8. Endometrial cancer/Anemia - Continue to follow with Dr. Janese Banks.  Anticipate her anemia is contributory to her dyspnea and fluid retention.  Presently well maintained on Lasix 20 mg daily, as above.  9. Valvular heart disease - Mild to moderate MR/AI by echo. Continue optimal BP and volume control. Anticipate monitoring with periodic echo.   Disposition: Follow up November as previously scheduled with Dr. [name].    Loel Dubonnet, NP 02/28/2020, 4:33 PM

## 2020-02-28 NOTE — Patient Instructions (Addendum)
Medication Instructions:  Your physician has recommended you make the following change in your medication:   START Potassium 70mEq daily  *If you need a refill on your cardiac medications before your next appointment, please call your pharmacy*  Lab Work: None ordered today.   Testing/Procedures: Your EKG today was stable compared to previous.   Follow-Up: At [facility], you and your health needs are our priority.  As part of our continuing mission to provide you with exceptional heart care, we have created designated Provider Care Teams.  These Care Teams include your primary Cardiologist (physician) and Advanced Practice Providers (APPs -  Physician Assistants and Nurse Practitioners) who all work together to provide you with the care you need, when you need it.  We recommend signing up for the patient portal called "MyChart".  Sign up information is provided on this After Visit Summary.  MyChart is used to connect with patients for Virtual Visits (Telemedicine).  Patients are able to view lab/test results, encounter notes, upcoming appointments, etc.  Non-urgent messages can be sent to your provider as well.   To learn more about what you can do with MyChart, go to [email or web address].    Your next appointment:   In November with Dr. [name] as previously scheduled.  Other Instructions  If Dr. Janese Banks elects to resume your Eliquis, let us know if we can be of assistance with patient assistance paperwork or samples.

## 2020-02-28 NOTE — Telephone Encounter (Signed)
Second failed and final attempt to reach patient for transition of care management. Patient seen today at Cardiology for hfu. Currently scheduled in office 9/16. Keep all scheduled appointments.

## 2020-02-29 ENCOUNTER — Other Ambulatory Visit: Payer: Self-pay

## 2020-02-29 ENCOUNTER — Ambulatory Visit (INDEPENDENT_AMBULATORY_CARE_PROVIDER_SITE_OTHER): Payer: PPO | Admitting: Internal Medicine

## 2020-02-29 ENCOUNTER — Encounter: Payer: Self-pay | Admitting: Internal Medicine

## 2020-02-29 DIAGNOSIS — Z09 Encounter for follow-up examination after completed treatment for conditions other than malignant neoplasm: Secondary | ICD-10-CM | POA: Diagnosis not present

## 2020-02-29 DIAGNOSIS — C786 Secondary malignant neoplasm of retroperitoneum and peritoneum: Secondary | ICD-10-CM

## 2020-02-29 DIAGNOSIS — D649 Anemia, unspecified: Secondary | ICD-10-CM | POA: Diagnosis not present

## 2020-02-29 DIAGNOSIS — N182 Chronic kidney disease, stage 2 (mild): Secondary | ICD-10-CM | POA: Diagnosis not present

## 2020-02-29 DIAGNOSIS — C801 Malignant (primary) neoplasm, unspecified: Secondary | ICD-10-CM

## 2020-02-29 DIAGNOSIS — I2609 Other pulmonary embolism with acute cor pulmonale: Secondary | ICD-10-CM | POA: Diagnosis not present

## 2020-02-29 NOTE — Progress Notes (Signed)
Subjective:  Patient ID: Jaime Hall, female    DOB: 08/18/1950  Age: 69 y.o. MRN: [id number]  CC: Diagnoses of Normocytic anemia, Peritoneal carcinomatosis ([place]), Chronic kidney disease, stage 2, mildly decreased GFR, Other acute pulmonary embolism with acute cor pulmonale ([place]), and Hospital discharge follow-up were pertinent to this visit.  HPI Jaime Hall presents for hospital follow up  This visit occurred during the SARS-CoV-2 public health emergency.  Safety protocols were in place, including screening questions prior to the visit, additional usage of staff PPE, and extensive cleaning of exam room while observing appropriate contact time as indicated for disinfecting solutions.   Patient is a 69 yr old female with history of Hodgkins lymphoma, perihilar radiation fibrosis , active metastatic endometrial cancer , receiving palliative chemotherapy , recent diagnosis of  segmental PE involving right lower lobe on Sept 1 , treated with Eliquis, who was admitted to [facility] on Sept 6 with sudden onset of acute respiratory distress secondary to acute on chronic diastolic heart failure and hemoptysis.   Eliquis was stopped   Chronic anemia:   Hgb has dropped since discharge and is now 8.3 . Has not been transfused   TAKING 20 mg lasix daily  More for weight gain   Recent diagnosis of PE diagnosed with CTA sept 1 2021 treated with Eliquis but stopped due to hemoptysis    Recurrent endometrial CA.  On palliative therapy .   TSH was 20  Synthroid  dose was increased to 75   Had follow up with Janese Banks on Monday.  Considering lowering the dose of eliquis when she resumes it after 2 weeks   Still wearing oxygen at home but takes it off and without it drops into the 80's   Sleeping on one pillow   Outpatient Medications Prior to Visit  Medication Sig Dispense Refill  . atorvastatin (LIPITOR) 10 MG tablet Take 1 tablet by mouth once daily (Patient taking differently: Take 10 mg by mouth  every evening. ) 90 tablet 2  . carvedilol (COREG) 3.125 MG tablet Take 1 tablet (3.125 mg total) by mouth 2 (two) times daily with a meal. 60 tablet 2  . cholecalciferol (VITAMIN D3) 25 MCG (1000 UNIT) tablet Take 1,000 Units by mouth in the morning and at bedtime.    . cyanocobalamin (,VITAMIN B-12,) 1000 MCG/ML injection INJECT 1ML IM WEEKLY FOR 4 WEEKS, THEN INJECT MONTHLY THEREAFTER (Patient taking differently: Inject 1,000 mcg into the skin every 30 (thirty) days. ) 10 mL 0  . DULoxetine (CYMBALTA) 30 MG capsule Take 1 capsule (30 mg total) by mouth daily. 90 capsule 2  . furosemide (LASIX) 20 MG tablet Take 1 tablet (20 mg total) by mouth as needed for fluid or edema (Take one tablet daily as needed for lower extremity edema or for weight gain of 2 lbs overnight or 5 lbs in one week.). (Patient taking differently: Take 20 mg by mouth daily. ) 30 tablet 2  . levothyroxine (SYNTHROID) 75 MCG tablet Take 1 tablet (75 mcg total) by mouth daily before breakfast. 30 tablet 0  . LORazepam (ATIVAN) 0.5 MG tablet Take 1 tablet (0.5 mg total) by mouth every 6 (six) hours as needed for anxiety (and to help with breathing). 120 tablet 0  . losartan (COZAAR) 25 MG tablet Take 0.5 tablets (12.5 mg total) by mouth daily. 15 tablet 2  . Morphine Sulfate (MORPHINE CONCENTRATE) 10 mg / 0.5 ml concentrated solution Take 0.25 mLs (5 mg total) by mouth  every 6 (six) hours as needed for severe pain or shortness of breath. 30 mL 0  . pantoprazole (PROTONIX) 20 MG tablet Take 1 tablet (20 mg total) by mouth daily. 60 tablet 1  . potassium chloride (KLOR-CON) 10 MEQ tablet Take 1 tablet (10 mEq total) by mouth daily. 90 tablet 1   Facility-Administered Medications Prior to Visit  Medication Dose Route Frequency Provider Last Rate Last Admin  . heparin lock flush 100 unit/mL  500 Units Intravenous Once Sindy Guadeloupe, MD      . sodium chloride flush (NS) 0.9 % injection 10 mL  10 mL Intravenous PRN Sindy Guadeloupe, MD    10 mL at 12/07/19 0914  . sodium chloride flush (NS) 0.9 % injection 10 mL  10 mL Intravenous PRN Sindy Guadeloupe, MD   10 mL at 01/04/20 0850    Review of Systems;  Patient denies headache, fevers, malaise, unintentional weight loss, skin rash, eye pain, sinus congestion and sinus pain, sore throat, dysphagia,  hemoptysis , cough, dyspnea, wheezing, chest pain, palpitations, orthopnea, edema, abdominal pain, nausea, melena, diarrhea, constipation, flank pain, dysuria, hematuria, urinary  Frequency, nocturia, numbness, tingling, seizures,  Focal weakness, Loss of consciousness,  Tremor, insomnia, depression, anxiety, and suicidal ideation.      Objective:  BP (!) 92/58 (BP Location: Left Arm, Patient Position: Sitting, Cuff Size: Normal)   Pulse 91   Temp 97.6 F (36.4 C) (Oral)   Resp 15   Ht 5\' 2"  (1.575 m)   Wt 152 lb 12.8 oz (69.3 kg)   SpO2 98% Comment: 2L O2  BMI 27.95 kg/m   BP Readings from Last 3 Encounters:  02/29/20 (!) 92/58  02/28/20 (!) 94/54  02/27/20 (!) 90/51    Wt Readings from Last 3 Encounters:  02/29/20 152 lb 12.8 oz (69.3 kg)  02/28/20 152 lb 3.2 oz (69 kg)  02/27/20 152 lb 6.4 oz (69.1 kg)    General appearance: alert, cooperative and appears stated age Ears: normal TM's and external ear canals both ears Throat: lips, mucosa, and tongue normal; teeth and gums normal Neck: no adenopathy, no carotid bruit, supple, symmetrical, trachea midline and thyroid not enlarged, symmetric, no tenderness/mass/nodules Back: symmetric, no curvature. ROM normal. No CVA tenderness. Lungs: clear to auscultation bilaterally Heart: regular rate and rhythm, S1, S2 normal, no murmur, click, rub or gallop Abdomen: soft, non-tender; bowel sounds normal; no masses,  no organomegaly Pulses: 2+ and symmetric Skin: Skin color, texture, turgor normal. No rashes or lesions Lymph nodes: Cervical, supraclavicular, and axillary nodes normal.  Lab Results  Component Value Date    HGBA1C 6.1 (H) 12/30/2019   HGBA1C 6.0 07/27/2019   HGBA1C 14.1 08/08/2018    Lab Results  Component Value Date   CREATININE 1.27 (H) 02/27/2020   CREATININE 1.17 (H) 02/22/2020   CREATININE 1.34 (H) 02/21/2020    Lab Results  Component Value Date   WBC 4.9 02/27/2020   HGB 8.2 (L) 02/27/2020   HCT 25.8 (L) 02/27/2020   PLT 114 (L) 02/27/2020   GLUCOSE 122 (H) 02/27/2020   CHOL 114 12/30/2019   TRIG 131 12/30/2019   HDL 47 12/30/2019   LDLCALC 41 12/30/2019   ALT 13 02/27/2020   AST 20 02/27/2020   NA 142 02/27/2020   K 3.3 (L) 02/27/2020   CL 101 02/27/2020   CREATININE 1.27 (H) 02/27/2020   BUN 18 02/27/2020   CO2 31 02/27/2020   TSH 11.200 (H) 02/20/2020   INR  1.4 (H) 02/22/2020   HGBA1C 6.1 (H) 12/30/2019   MICROALBUR 2.6 (H) 10/04/2017    US Venous Img Lower Bilateral (DVT)  Result Date: 02/20/2020 CLINICAL DATA:  PE EXAM: BILATERAL LOWER EXTREMITY VENOUS DOPPLER ULTRASOUND TECHNIQUE: Gray-scale sonography with compression, as well as color and duplex ultrasound, were performed to evaluate the deep venous system(s) from the level of the common femoral vein through the popliteal and proximal calf veins. COMPARISON:  None. FINDINGS: VENOUS Normal compressibility of the common femoral, superficial femoral, and popliteal veins, as well as the visualized calf veins (peroneal vein not well visualized on the left). Visualized portions of profunda femoral vein and great saphenous vein unremarkable. No filling defects to suggest DVT on grayscale or color Doppler imaging. Doppler waveforms show normal direction of venous flow, normal respiratory plasticity and response to augmentation. OTHER None. Limitations: none IMPRESSION: No evidence of DVT in the lower extremities. Electronically Signed   By: Macy Mis M.D.   On: 02/20/2020 12:08   DG Chest Portable 1 View  Result Date: 02/19/2020 CLINICAL DATA:  Pt arrives via [facility] from home with complaint of SOB. Pt low 70's% on RA,  pt with blue lips. EMS administered 1 duoneb and placed on 8L NRB, spo2 100%. Pt tachy in the low 100's for HR EXAM: PORTABLE CHEST 1 VIEW COMPARISON:  02/13/2020 FINDINGS: Hazy opacities are noted in both lungs. Scarring is noted extending from the hila into the medial upper lobes. No convincing pleural effusion and no pneumothorax. Right internal jugular central venous catheter is stable, tip in the lower superior vena cava. Cardiac silhouette is mildly enlarged. No mediastinal or hilar masses. No evidence of adenopathy. Skeletal structures are grossly intact. IMPRESSION: 1. Hazy opacity is noted in lungs, representing a change from the most recent prior chest radiograph. Findings may be due to multifocal pneumonia or pulmonary edema. Electronically Signed   By: Lajean Manes M.D.   On: 02/19/2020 21:41    Assessment & Plan:   Problem List Items Addressed This Visit      Unprioritized   Normocytic anemia    Chronic. With b12 deficiency identified during prior workups.  hgb was 9.9 at admission and 8.7 at discharge      Chronic kidney disease, stage 2, mildly decreased GFR    Mild, aggravated in the past by NSAID use  .  Lab Results  Component Value Date   CREATININE 1.27 (H) 02/27/2020         Peritoneal carcinomatosis ([place])    Diagnosed in may 2021 during workup for abdominal discomfort She has been receiving palliative chemotherapy which she has not tolerated. She has close follow up with Dr Janese Banks.       Acute pulmonary embolism ([place])    Secondary to metastatic CA.  Diagnosed on Sept 1,  Treated for several days with Eliquis until it was suspended due to hemoptysis       Hospital discharge follow-up    Patient is stable post discharge and has no new issues or questions about discharge plans at the visit today for hospital follow up. All labs , imaging studies and progress notes from admission were reviewed with patient today           I am having Jaime Hall maintain her  cyanocobalamin, atorvastatin, cholecalciferol, furosemide, pantoprazole, DULoxetine, LORazepam, carvedilol, losartan, levothyroxine, morphine CONCENTRATE, and potassium chloride.  No orders of the defined types were placed in this encounter.   There are no discontinued medications.  Follow-up: No follow-ups on file.   [name], MD

## 2020-03-01 MED ORDER — APIXABAN 2.5 MG PO TABS: 2.5000 mg | ORAL_TABLET | Freq: Two times a day (BID) | ORAL | 1 refills | Status: DC

## 2020-03-01 NOTE — Progress Notes (Signed)
Hematology/Oncology Consult note [facility]  Telephone:([phone] [phone]  Patient Care Team: [name], MD as PCP - General (Internal Medicine) Wellington Hampshire, MD as PCP - Cardiology (Cardiology) [name], MD as Consulting Physician (General Surgery) Mellody Drown, MD as Referring Physician (Obstetrics and Gynecology) [name], MD as Referring Physician (Obstetrics and Gynecology) Hollice Espy, MD as Consulting Physician (Urology) [name], RN as Registered Nurse Sindy Guadeloupe, MD as Consulting Physician (Oncology)   Name of the patient: Jaime Hall  [id number]  Oct 26, 1950   Date of visit: 03/01/20  Diagnosis- 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 She underwent robotic hysterectomy with bilateral salpingo-oophorectomy with washings and sentinel lymph node injection mapping and biopsy in February 2018. She had multiple postoperative complications and completed 3 cycles of carbotaxol chemotherapy in May 2018. Treatment was complicated by chemo-induced peripheral neuropathy for which Taxol dose was reduced by 25%. She completed 5 weeks of external beam whole pelvic radiation on August 2018.Her original tumor in 2018 was ER +1%. PR negative. MSI stable and HER-2 negative  She was under clinical surveillance and recently presented to [name] with symptoms of right lower quadrant abdominal pain which led to aCT abdomen which showed new peritoneal metastatic disease throughout the right abdomen and pelvis with index masses measuring 5.7 x 4.7 cm. Head CT scan also showed evidence of hypermetabolic  abdominal pelvic peritoneal metastases but no evidence of other sites of metastases.  Repeat omental biopsy confirms high grade serous carcinoma.Foundation 1 testing showed following alteration/biomarkers. Microsatellite status stable. EPH for be amplification, ESR 1 amplification, HNF1 A G 292 FS 25, follow-up 1 amplification, with 3 ER 1 loss, PPP 2R1 AAS 256F, PTEN loss, T p53. However none of these mutations have actionable targets.  Repeat echocardiogram showed EF of 30 to 35%. Cardiac cath unremarkable consistent with nonischemic cardiomyopathy.Therefore Doxil not chosen and she would be getting carboplatin and gemcitabine. Chemotherapy complicated by significant anemia causing supply demand cardiac ischemia and hospitalization.  Patient switched to [place] and lenvatinib starting 01/11/2020   Interval history-patient was readmitted to the hospital for worsening shortness of breath and scant hemoptysis.  She was diuresed and eventually discharged on 2 L of oxygen.  She continues to live alone and manage her ADLs.  She is here with her son today  ECOG PS- 2-3 Pain scale- 0 Opioid associated constipation- no  Review of systems- Review of Systems  Constitutional: Positive for malaise/fatigue. Negative for chills, fever and weight loss.  HENT: Negative for congestion, ear discharge and nosebleeds.   Eyes: Negative for blurred vision.  Respiratory: Positive for shortness of breath. Negative for cough, hemoptysis, sputum production and wheezing.   Cardiovascular: Negative for chest pain, palpitations, orthopnea and claudication.  Gastrointestinal: Negative for abdominal pain, blood in stool, constipation, diarrhea, heartburn, melena, nausea and vomiting.  Genitourinary: Negative for dysuria, flank pain, frequency, hematuria and urgency.  Musculoskeletal: Negative for back pain, joint pain and myalgias.  Skin: Negative for rash.  Neurological: Negative for dizziness, tingling, focal  weakness, seizures, weakness and headaches.  Endo/Heme/Allergies: Does not bruise/bleed easily.  Psychiatric/Behavioral: Negative for depression and suicidal ideas. The patient does not have insomnia.  Allergies  Allergen Reactions   Adhesive [Tape] Other (See Comments)    Skin Irritation    Antifungal [Miconazole Nitrate] Rash   Sulfa Antibiotics Nausea And Vomiting and Rash   Z-Pak [Azithromycin] Itching     Past Medical History:  Diagnosis Date   Anxiety    Cervicalgia    CHF (congestive heart failure) (HCC)    Chronic kidney disease    Coronary artery disease    a. 02/2012 Stress echo: severe anterior wall ischemia;  b. 02/2012 Cath/PCI: LAD 95p (3.0 x 15 Xience EX DES), D1 90ost (PTCA - bifurcational dzs), EF 45% with anterior HK;  b. 02/2013 Ex MV: fixed anterior defect w/ minor reversibility, nl EF-->Med Rx.   Endometrial cancer ([place])    a. 07/2016 s/p robotic hysterectomy, BSO w/ washings, sentinel node inj, mapping, bx, adhesiolysis.   Essential hypertension, benign    Fibrocystic breast disease    GERD (gastroesophageal reflux disease)    Gestational hypertension    Heart murmur    History of anemia    History of blood transfusion    Hodgkin's lymphoma ([place]) 2011   a. s/p radiation and chemo therapy   Osteoarthritis    Polycystic ovarian disease      Past Surgical History:  Procedure Laterality Date   ABDOMINAL HYSTERECTOMY     bladder sling     CARDIAC CATHETERIZATION  02/2012   [facility] 1 stent place   CERVICAL POLYPECTOMY     CHOLECYSTECTOMY  1982   COLONOSCOPY WITH PROPOFOL N/A 02/05/2015   Procedure: COLONOSCOPY WITH PROPOFOL;  Surgeon: Lucilla Lame, MD;  Location: [facility] END[facility]COPY;  Service: Endoscopy;  Laterality: N/A;   CORONARY ANGIOPLASTY  02/2012   left/right s/p balloon   CYSTOGRAM N/A 08/17/2016   Procedure: CYSTOGRAM;  Surgeon: Hollice Espy, MD;  Location: [facility] ORS;  Service: Urology;  Laterality: N/A;   CYSTOSCOPY N/A  08/17/2016   Procedure: CYSTOSCOPY EXAM UNDER ANESTHESIA;  Surgeon: Hollice Espy, MD;  Location: [facility] ORS;  Service: Urology;  Laterality: N/A;   CYSTOSCOPY W/ RETROGRADES Bilateral 08/17/2016   Procedure: CYSTOSCOPY WITH RETROGRADE PYELOGRAM;  Surgeon: Hollice Espy, MD;  Location: [facility] ORS;  Service: Urology;  Laterality: Bilateral;   CYSTOSCOPY WITH STENT PLACEMENT Right 08/17/2016   Procedure: CYSTOSCOPY WITH STENT PLACEMENT;  Surgeon: Hollice Espy, MD;  Location: [facility] ORS;  Service: Urology;  Laterality: Right;   heart stent'  2013   kidney stent Right 2018   LYMPH NODE BIOPSY  2011   diagnosis of hodgkins lymphoma   PELVIC LYMPH NODE DISSECTION N/A 07/29/2016   Procedure: PELVIC/AORTIC LYMPH NODE SAMPLING;  Surgeon: [name], MD;  Location: [facility] ORS;  Service: Gynecology;  Laterality: N/A;   PORTA CATH INSERTION N/A 09/22/2016   Procedure: Glori Luis Cath Insertion;  Surgeon: [name], MD;  Location: [place] [facility];  Service: Cardiovascular;  Laterality: N/A;   PORTA CATH INSERTION N/A 10/27/2019   Procedure: PORTA CATH INSERTION;  Surgeon: [name], MD;  Location: [place] [facility];  Service: Cardiovascular;  Laterality: N/A;   PORTA CATH REMOVAL N/A 11/17/2016   Procedure: Glori Luis Cath Removal;  Surgeon: [name], MD;  Location: [place] [facility];  Service: Cardiovascular;  Laterality: N/A;   RIGHT/LEFT HEART CATH AND CORONARY ANGIOGRAPHY N/A 12/04/2019   Procedure: RIGHT/LEFT HEART CATH AND CORONARY ANGIOGRAPHY;  Surgeon: Wellington Hampshire, MD;  Location: [place] [facility];  Service: Cardiovascular;  Laterality: N/A;   ROBOTIC ASSISTED TOTAL HYSTERECTOMY WITH BILATERAL  SALPINGO OOPHERECTOMY N/A 07/29/2016   Procedure: ROBOTIC ASSISTED TOTAL HYSTERECTOMY WITH BILATERAL SALPINGO OOPHORECTOMY;  Surgeon: [name], MD;  Location: [facility];  Service: Gynecology;  Laterality: N/A;   SENTINEL NODE BIOPSY N/A 07/29/2016    Procedure: SENTINEL NODE BIOPSY;  Surgeon: [name], MD;  Location: [facility];  Service: Gynecology;  Laterality: N/A;   transobturator sling N/A 2009   [place]    Social History   Socioeconomic History   Marital status: Widowed    Spouse name: Not on file   Number of children: Not on file   Years of education: Not on file   Highest education level: Not on file  Occupational History   Not on file  Tobacco Use   Smoking status: Former Smoker    Packs/day: 1.00    Years: 30.00    Pack years: 30.00    Types: Cigarettes    Quit date: 07/24/2002    Years since quitting: 17.6   Smokeless tobacco: Never Used   Tobacco comment: quit smoking in 2000  Vaping Use   Vaping Use: Never used  Substance and Sexual Activity   Alcohol use: Not Currently   Drug use: No   Sexual activity: Never  Other Topics Concern   Not on file  Social History Narrative   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    Social Determinants of Health   Financial Resource Strain:    Difficulty of Paying Living Expenses: Not on file  Food Insecurity:    Worried About Charity fundraiser in the Last Year: Not on file   [employer] of Food in the Last Year: Not on file  Transportation Needs:    Lack of Transportation (Medical): Not on file   Lack of Transportation (Non-Medical): Not on file  Physical Activity:    Days of Exercise per Week: Not on file   Minutes of Exercise per Session: Not on file  Stress:    Feeling of Stress : Not on file  Social Connections:    Frequency of Communication with Friends and Family: Not on file   Frequency of Social Gatherings with Friends and Family: Not on file   Attends Religious Services: Not on file   Active Member of Clubs or Organizations: Not on file   Attends Archivist Meetings: Not on file   Marital Status: Not on file  Intimate Partner Violence:    Fear of Current  or Ex-Partner: Not on file   Emotionally Abused: Not on file   Physically Abused: Not on file   Sexually Abused: Not on file    Family History  Problem Relation Age of Onset   ALS Father    Polymyositis Father    Diabetes Brother    Cancer Maternal Aunt        breast   Breast cancer Maternal Aunt        30's   Stroke Maternal Grandmother    Cancer Maternal Grandfather        prostate   Stroke Maternal Grandfather    Colon cancer Maternal Aunt    Non-Hodgkin's lymphoma Cousin      Current Outpatient Medications:    atorvastatin (LIPITOR) 10 MG tablet, Take 1 tablet by mouth once daily (Patient taking differently: Take 10 mg by mouth every evening. ), Disp: 90 tablet, Rfl: 2   carvedilol (COREG) 3.125 MG tablet, Take 1 tablet (3.125 mg total)  by mouth 2 (two) times daily with a meal., Disp: 60 tablet, Rfl: 2   cholecalciferol (VITAMIN D3) 25 MCG (1000 UNIT) tablet, Take 1,000 Units by mouth in the morning and at bedtime., Disp: , Rfl:    cyanocobalamin (,VITAMIN B-12,) 1000 MCG/ML injection, INJECT 1ML IM WEEKLY FOR 4 WEEKS, THEN INJECT MONTHLY THEREAFTER (Patient taking differently: Inject 1,000 mcg into the skin every 30 (thirty) days. ), Disp: 10 mL, Rfl: 0   DULoxetine (CYMBALTA) 30 MG capsule, Take 1 capsule (30 mg total) by mouth daily., Disp: 90 capsule, Rfl: 2   furosemide (LASIX) 20 MG tablet, Take 1 tablet (20 mg total) by mouth as needed for fluid or edema (Take one tablet daily as needed for lower extremity edema or for weight gain of 2 lbs overnight or 5 lbs in one week.). (Patient taking differently: Take 20 mg by mouth daily. ), Disp: 30 tablet, Rfl: 2   levothyroxine (SYNTHROID) 75 MCG tablet, Take 1 tablet (75 mcg total) by mouth daily before breakfast., Disp: 30 tablet, Rfl: 0   LORazepam (ATIVAN) 0.5 MG tablet, Take 1 tablet (0.5 mg total) by mouth every 6 (six) hours as needed for anxiety (and to help with breathing)., Disp: 120 tablet, Rfl: 0    losartan (COZAAR) 25 MG tablet, Take 0.5 tablets (12.5 mg total) by mouth daily., Disp: 15 tablet, Rfl: 2   pantoprazole (PROTONIX) 20 MG tablet, Take 1 tablet (20 mg total) by mouth daily., Disp: 60 tablet, Rfl: 1   apixaban (ELIQUIS) 2.5 MG TABS tablet, Take 1 tablet (2.5 mg total) by mouth 2 (two) times daily., Disp: 60 tablet, Rfl: 1   Morphine Sulfate (MORPHINE CONCENTRATE) 10 mg / 0.5 ml concentrated solution, Take 0.25 mLs (5 mg total) by mouth every 6 (six) hours as needed for severe pain or shortness of breath., Disp: 30 mL, Rfl: 0   potassium chloride (KLOR-CON) 10 MEQ tablet, Take 1 tablet (10 mEq total) by mouth daily., Disp: 90 tablet, Rfl: 1 No current facility-administered medications for this visit.  Facility-Administered Medications Ordered in Other Visits:    heparin lock flush 100 unit/mL, 500 Units, Intravenous, Once, Sindy Guadeloupe, MD   sodium chloride flush (NS) 0.9 % injection 10 mL, 10 mL, Intravenous, PRN, Sindy Guadeloupe, MD, 10 mL at 12/07/19 0914   sodium chloride flush (NS) 0.9 % injection 10 mL, 10 mL, Intravenous, PRN, Sindy Guadeloupe, MD, 10 mL at 01/04/20 0850  Physical exam:  Vitals:   02/27/20 1043  BP: (!) 90/51  Pulse: 87  Resp: 16  Temp: (!) 96.3 F (35.7 C)  TempSrc: Tympanic  SpO2: 100%  Weight: 152 lb 6.4 oz (69.1 kg)   Physical Exam Constitutional:      General: She is not in acute distress.    Comments: On home oxygen  Cardiovascular:     Rate and Rhythm: Normal rate and regular rhythm.     Heart sounds: Normal heart sounds.  Pulmonary:     Effort: Pulmonary effort is normal.     Breath sounds: Normal breath sounds.  Abdominal:     General: Bowel sounds are normal.     Palpations: Abdomen is soft.  Skin:    General: Skin is warm and dry.  Neurological:     Mental Status: She is alert and oriented to person, place, and time.      CMP Latest Ref Rng & Units 02/27/2020  Glucose 70 - 99 mg/dL 122(H)  BUN 8 - 23  mg/dL 18    Creatinine 0.44 - 1.00 mg/dL 1.27(H)  Sodium 135 - 145 mmol/L 142  Potassium 3.5 - 5.1 mmol/L 3.3(L)  Chloride 98 - 111 mmol/L 101  CO2 22 - 32 mmol/L 31  Calcium 8.9 - 10.3 mg/dL 8.0(L)  Total Protein 6.5 - 8.1 g/dL 5.7(L)  Total Bilirubin 0.3 - 1.2 mg/dL 2.6(H)  Alkaline Phos 38 - 126 U/L 52  AST 15 - 41 U/L 20  ALT 0 - 44 U/L 13   CBC Latest Ref Rng & Units 02/27/2020  WBC 4.0 - 10.5 K/uL 4.9  Hemoglobin 12.0 - 15.0 g/dL 8.2(L)  Hematocrit 36 - 46 % 25.8(L)  Platelets 150 - 400 K/uL 114(L)    No images are attached to the encounter.  DG Chest 2 View  Result Date: 02/13/2020 CLINICAL DATA:  Shortness of breath. On chemotherapy for endometrial cancer. EXAM: CHEST - 2 VIEW COMPARISON:  12/29/2019 FINDINGS: Midline trachea. Normal heart size. Right sided central line terminates at the caval atrial junction. Paramediastinal radiation fibrosis with architectural distortion, similar. No pleural effusion or pneumothorax. Mild right hemidiaphragm elevation. Basilar predominant interstitial thickening, likely related to COPD/chronic bronchitis. No lobar consolidation. Right upper quadrant surgical clips. IMPRESSION: No acute cardiopulmonary disease. Electronically Signed   By: Abigail Miyamoto M.D.   On: 02/13/2020 17:29   CT Angio Chest PE W and/or Wo Contrast  Result Date: 02/14/2020 CLINICAL DATA:  High probability for pulmonary embolism. Shortness of breath. On chemotherapy for endometrial cancer. EXAM: CT ANGIOGRAPHY CHEST WITH CONTRAST TECHNIQUE: Multidetector CT imaging of the chest was performed using the standard protocol during bolus administration of intravenous contrast. Multiplanar CT image reconstructions and MIPs were obtained to evaluate the vascular anatomy. CONTRAST:  107m OMNIPAQUE IOHEXOL 350 MG/ML SOLN COMPARISON:  01/16/2020 chest CT FINDINGS: Cardiovascular: Cardiomegaly, most notably left heart dilatation. Atherosclerotic calcification and LAD stenting. Branching, central  pulmonary artery filling defects in lateral and posterior segments of the right lower lobe. Porta catheter on the right. Mediastinum/Nodes: No adenopathy Lungs/Pleura: Bilateral perihilar radiation fibrosis. Mild ground-glass density asymmetric to the right base. Upper Abdomen: No acute finding.  Cholecystectomy. Musculoskeletal: No acute or aggressive finding. Review of the MIP images confirms the above findings. Critical Value/emergent results were called by telephone at the time of interpretation on 02/14/2020 at 6:28 am [facility] who verbally acknowledged these results. IMPRESSION: 1. Acute segmental emboli to the right lower lobe. 2. Mild ground-glass opacity in the right lower lobe, unlikely to be ischemic given the nonocclusive clot and patchy pattern. Please correlate for infectious symptoms. Electronically Signed   By: JMonte FantasiaM.D.   On: 02/14/2020 06:30   [place]Venous Img Lower Bilateral (DVT)  Result Date: 02/20/2020 CLINICAL DATA:  PE EXAM: BILATERAL LOWER EXTREMITY VENOUS DOPPLER ULTRASOUND TECHNIQUE: Gray-scale sonography with compression, as well as color and duplex ultrasound, were performed to evaluate the deep venous system(s) from the level of the common femoral vein through the popliteal and proximal calf veins. COMPARISON:  None. FINDINGS: VENOUS Normal compressibility of the common femoral, superficial femoral, and popliteal veins, as well as the visualized calf veins (peroneal vein not well visualized on the left). Visualized portions of profunda femoral vein and great saphenous vein unremarkable. No filling defects to suggest DVT on grayscale or color Doppler imaging. Doppler waveforms show normal direction of venous flow, normal respiratory plasticity and response to augmentation. OTHER None. Limitations: none IMPRESSION: No evidence of DVT in the lower extremities. Electronically Signed  By: Macy Mis M.D.   On: 02/20/2020 12:08   DG Chest Portable 1  View  Result Date: 02/19/2020 CLINICAL DATA:  Pt arrives via [facility] from home with complaint of SOB. Pt low 70's% on RA, pt with blue lips. EMS administered 1 duoneb and placed on 8L NRB, spo2 100%. Pt tachy in the low 100's for HR EXAM: PORTABLE CHEST 1 VIEW COMPARISON:  02/13/2020 FINDINGS: Hazy opacities are noted in both lungs. Scarring is noted extending from the hila into the medial upper lobes. No convincing pleural effusion and no pneumothorax. Right internal jugular central venous catheter is stable, tip in the lower superior vena cava. Cardiac silhouette is mildly enlarged. No mediastinal or hilar masses. No evidence of adenopathy. Skeletal structures are grossly intact. IMPRESSION: 1. Hazy opacity is noted in lungs, representing a change from the most recent prior chest radiograph. Findings may be due to multifocal pneumonia or pulmonary edema. Electronically Signed   By: Lajean Manes M.D.   On: 02/19/2020 21:41     Assessment and plan- Patient is a 69 y.o. female with recurrent high-grade serous endometrial carcinoma on second line lenvatinib and Keytruda chemotherapy complicated by repeated episodes of acute on chronic congestive heart failure as well as recent segmental PE  Patient's most recent scans in August 2021 showed interval mixed response to treatment with significant reduction of peritoneal and omental nodules however there was a mixed response in the same that there was a new peritoneal nodule seen in the anterior left hemiabdomen.  She was unable to tolerate chemotherapy and was hence switched to [facility] and lenvatinib.  However since the start of this regimen patient has been admitted twice for episodes of heart failure and was also found to have a recent segmental PE.  The performance status is declined since then.  She lives alone and is on home oxygen.  We discussed holding any treatment for the next 2 weeks and see how she does at home.  If her performance status does not  improve hospice would be a reasonable consideration.  I have asked her to continue holding her lenvatinib at this time.  Keytruda single agent does not have significant response in the setting  Segmental PE: Patient was admitted to the hospital for hemoptysis which is unclear if it was secondary to PE itself versus heart failure.  Regardless I will switch her to a lower dose of Eliquis 2.5 mg twice daily and see how she tolerates it.  I will also add low-dose morphine as needed for shortness of breath.  Patient is a DNR/DNI and I spoke to her son as well who hopes to bring her to one of her children's house if she is unable to manage alone.  Patient would like to remain independent as long as possible.   Total face to face encounter time for this patient visit was 30 min.     Visit Diagnosis 1. Endometrial cancer ([place])   2. Goals of care, counseling/discussion   3. Acute respiratory failure with hypoxia (HCC)   4. Other acute pulmonary embolism without acute cor pulmonale ([place])      Dr. Randa Evens, MD, MPH [facility] [phone] 03/01/2020 8:38 AM

## 2020-03-02 DIAGNOSIS — Z09 Encounter for follow-up examination after completed treatment for conditions other than malignant neoplasm: Secondary | ICD-10-CM | POA: Insufficient documentation

## 2020-03-02 NOTE — Assessment & Plan Note (Signed)
Diagnosed in may 2021 during workup for abdominal discomfort She has been receiving palliative chemotherapy which she has not tolerated. She has close follow up with Dr Janese Banks.

## 2020-03-02 NOTE — Assessment & Plan Note (Signed)
Mild, aggravated in the past by NSAID use  .  Lab Results  Component Value Date   CREATININE 1.27 (H) 02/27/2020

## 2020-03-02 NOTE — Assessment & Plan Note (Signed)
Chronic. With b12 deficiency identified during prior workups.  hgb was 9.9 at admission and 8.7 at discharge

## 2020-03-02 NOTE — Assessment & Plan Note (Signed)
Patient is stable post discharge and has no new issues or questions about discharge plans at the visit today for hospital follow up. All labs , imaging studies and progress notes from admission were reviewed with patient today   

## 2020-03-02 NOTE — Assessment & Plan Note (Signed)
Secondary to metastatic CA.  Diagnosed on Sept 1,  Treated for several days with Eliquis until it was suspended due to hemoptysis

## 2020-03-12 ENCOUNTER — Inpatient Hospital Stay (HOSPITAL_BASED_OUTPATIENT_CLINIC_OR_DEPARTMENT_OTHER): Payer: PPO | Admitting: Oncology

## 2020-03-12 ENCOUNTER — Other Ambulatory Visit: Payer: Self-pay

## 2020-03-12 ENCOUNTER — Encounter: Payer: Self-pay | Admitting: Oncology

## 2020-03-12 ENCOUNTER — Inpatient Hospital Stay: Payer: PPO

## 2020-03-12 VITALS — BP 94/50 | HR 84 | Resp 16

## 2020-03-12 VITALS — BP 98/49 | HR 94 | Temp 96.6°F | Resp 16 | Wt 148.7 lb

## 2020-03-12 DIAGNOSIS — C786 Secondary malignant neoplasm of retroperitoneum and peritoneum: Secondary | ICD-10-CM | POA: Diagnosis not present

## 2020-03-12 DIAGNOSIS — Z7189 Other specified counseling: Secondary | ICD-10-CM

## 2020-03-12 DIAGNOSIS — C801 Malignant (primary) neoplasm, unspecified: Secondary | ICD-10-CM

## 2020-03-12 DIAGNOSIS — R112 Nausea with vomiting, unspecified: Secondary | ICD-10-CM

## 2020-03-12 DIAGNOSIS — C541 Malignant neoplasm of endometrium: Secondary | ICD-10-CM

## 2020-03-12 LAB — CBC WITH DIFFERENTIAL/PLATELET
Abs Immature Granulocytes: 0.01 10*3/uL (ref 0.00–0.07)
Basophils Absolute: 0 10*3/uL (ref 0.0–0.1)
Basophils Relative: 1 %
Eosinophils Absolute: 0.1 10*3/uL (ref 0.0–0.5)
Eosinophils Relative: 2 %
HCT: 30.3 % — ABNORMAL LOW (ref 36.0–46.0)
Hemoglobin: 9.5 g/dL — ABNORMAL LOW (ref 12.0–15.0)
Immature Granulocytes: 0 %
Lymphocytes Relative: 22 %
Lymphs Abs: 0.8 10*3/uL (ref 0.7–4.0)
MCH: 30.1 pg (ref 26.0–34.0)
MCHC: 31.4 g/dL (ref 30.0–36.0)
MCV: 95.9 fL (ref 80.0–100.0)
Monocytes Absolute: 0.3 10*3/uL (ref 0.1–1.0)
Monocytes Relative: 9 %
Neutro Abs: 2.6 10*3/uL (ref 1.7–7.7)
Neutrophils Relative %: 66 %
Platelets: 166 10*3/uL (ref 150–400)
RBC: 3.16 MIL/uL — ABNORMAL LOW (ref 3.87–5.11)
RDW: 15.7 % — ABNORMAL HIGH (ref 11.5–15.5)
WBC: 3.9 10*3/uL — ABNORMAL LOW (ref 4.0–10.5)
nRBC: 0 % (ref 0.0–0.2)

## 2020-03-12 LAB — COMPREHENSIVE METABOLIC PANEL
ALT: 13 U/L (ref 0–44)
AST: 23 U/L (ref 15–41)
Albumin: 3.2 g/dL — ABNORMAL LOW (ref 3.5–5.0)
Alkaline Phosphatase: 69 U/L (ref 38–126)
Anion gap: 10 (ref 5–15)
BUN: 19 mg/dL (ref 8–23)
CO2: 30 mmol/L (ref 22–32)
Calcium: 8.7 mg/dL — ABNORMAL LOW (ref 8.9–10.3)
Chloride: 99 mmol/L (ref 98–111)
Creatinine, Ser: 1.77 mg/dL — ABNORMAL HIGH (ref 0.44–1.00)
GFR calc Af Amer: 33 mL/min — ABNORMAL LOW (ref >60)
GFR calc non Af Amer: 29 mL/min — ABNORMAL LOW (ref >60)
Glucose, Bld: 124 mg/dL — ABNORMAL HIGH (ref 70–99)
Potassium: 3.3 mmol/L — ABNORMAL LOW (ref 3.5–5.1)
Sodium: 139 mmol/L (ref 135–145)
Total Bilirubin: 1.4 mg/dL — ABNORMAL HIGH (ref 0.3–1.2)
Total Protein: 6.4 g/dL — ABNORMAL LOW (ref 6.5–8.1)

## 2020-03-12 MED ORDER — SODIUM CHLORIDE 0.9 % IV SOLN
10.0000 mg | Freq: Once | INTRAVENOUS | Status: DC
  Filled 2020-03-12: qty 1

## 2020-03-12 MED ORDER — PROCHLORPERAZINE EDISYLATE 10 MG/2ML IJ SOLN
10.0000 mg | Freq: Once | INTRAMUSCULAR | Status: AC
  Administered 2020-03-12: 10 mg via INTRAVENOUS
  Filled 2020-03-12: qty 2

## 2020-03-12 MED ORDER — HEPARIN SOD (PORK) LOCK FLUSH 100 UNIT/ML IV SOLN
500.0000 [IU] | Freq: Once | INTRAVENOUS | Status: AC
  Administered 2020-03-12: 500 [IU] via INTRAVENOUS
  Filled 2020-03-12: qty 5

## 2020-03-12 MED ORDER — SODIUM CHLORIDE 0.9% FLUSH
10.0000 mL | INTRAVENOUS | Status: DC | PRN
  Administered 2020-03-12: 10 mL via INTRAVENOUS
  Filled 2020-03-12: qty 10

## 2020-03-12 MED ORDER — DEXAMETHASONE SODIUM PHOSPHATE 10 MG/ML IJ SOLN
10.0000 mg | Freq: Once | INTRAMUSCULAR | Status: AC
  Administered 2020-03-12: 10 mg via INTRAVENOUS

## 2020-03-12 MED ORDER — SODIUM CHLORIDE 0.9 % IV SOLN
Freq: Once | INTRAVENOUS | Status: AC
  Filled 2020-03-12: qty 250

## 2020-03-12 NOTE — Progress Notes (Signed)
Patient had vomiting in clinic this am. Added to [facility] for IVF's and supportive meds. Patient stated she felt much better at time of discharge.

## 2020-03-13 ENCOUNTER — Encounter: Payer: Self-pay | Admitting: Oncology

## 2020-03-13 LAB — CA 125: Cancer Antigen (CA) 125: 41.8 U/mL — ABNORMAL HIGH (ref 0.0–38.1)

## 2020-03-15 ENCOUNTER — Telehealth: Payer: Self-pay | Admitting: *Deleted

## 2020-03-15 DIAGNOSIS — D696 Thrombocytopenia, unspecified: Secondary | ICD-10-CM | POA: Diagnosis not present

## 2020-03-15 DIAGNOSIS — J9601 Acute respiratory failure with hypoxia: Secondary | ICD-10-CM | POA: Diagnosis not present

## 2020-03-15 DIAGNOSIS — D631 Anemia in chronic kidney disease: Secondary | ICD-10-CM | POA: Diagnosis not present

## 2020-03-15 DIAGNOSIS — I2510 Atherosclerotic heart disease of native coronary artery without angina pectoris: Secondary | ICD-10-CM | POA: Diagnosis not present

## 2020-03-15 DIAGNOSIS — N189 Chronic kidney disease, unspecified: Secondary | ICD-10-CM | POA: Diagnosis not present

## 2020-03-15 DIAGNOSIS — I5043 Acute on chronic combined systolic (congestive) and diastolic (congestive) heart failure: Secondary | ICD-10-CM | POA: Diagnosis not present

## 2020-03-15 DIAGNOSIS — N179 Acute kidney failure, unspecified: Secondary | ICD-10-CM | POA: Diagnosis not present

## 2020-03-15 DIAGNOSIS — I130 Hypertensive heart and chronic kidney disease with heart failure and stage 1 through stage 4 chronic kidney disease, or unspecified chronic kidney disease: Secondary | ICD-10-CM | POA: Diagnosis not present

## 2020-03-15 DIAGNOSIS — I2699 Other pulmonary embolism without acute cor pulmonale: Secondary | ICD-10-CM | POA: Diagnosis not present

## 2020-03-15 DIAGNOSIS — E785 Hyperlipidemia, unspecified: Secondary | ICD-10-CM | POA: Diagnosis not present

## 2020-03-15 DIAGNOSIS — E876 Hypokalemia: Secondary | ICD-10-CM | POA: Diagnosis not present

## 2020-03-15 DIAGNOSIS — C541 Malignant neoplasm of endometrium: Secondary | ICD-10-CM | POA: Diagnosis not present

## 2020-03-15 DIAGNOSIS — I080 Rheumatic disorders of both mitral and aortic valves: Secondary | ICD-10-CM | POA: Diagnosis not present

## 2020-03-15 NOTE — Telephone Encounter (Signed)
Called the patient today to see if she had decided whether or not she wanted to start [place] or go with other options.  Patient states that she does not want to do the [place] she is just worried about all the side effects again.  She states that she has a palliative care consult and someone supposed to come out and see her next week.  She has been wearing her oxygen most of the time but sometimes when she is sitting down she will take it off.  She can have 1 good day and then the rest I will not so good. Pt thinks that she starts with palliative care and then to hospice and I told her that it differs for each pt. She just does not know what doctor to go too- either oncology or pcp. I told patient that I would let Dr. Janese Banks know her decision and see what we need to do for the future.  Patient agreeable to the plan

## 2020-03-17 NOTE — Progress Notes (Signed)
Hematology/Oncology Consult note [facility]  [phone] [phone]  Patient Care Team: [name], MD as PCP - General (Internal Medicine) Wellington Hampshire, MD as PCP - Cardiology (Cardiology) [name], MD as Consulting Physician (General Surgery) Mellody Drown, MD as Referring Physician (Obstetrics and Gynecology) [name], MD as Referring Physician (Obstetrics and Gynecology) Hollice Espy, MD as Consulting Physician (Urology) [name], RN as Registered Nurse Sindy Guadeloupe, MD as Consulting Physician (Oncology)   Name of the patient: Jaime Hall  [id number]  Aug 28, 1950   Date of visit: 03/17/20  Diagnosis- 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 She underwent robotic hysterectomy with bilateral salpingo-oophorectomy with washings and sentinel lymph node injection mapping and biopsy in February 2018. She had multiple postoperative complications and completed 3 cycles of carbotaxol chemotherapy in May 2018. Treatment was complicated by chemo-induced peripheral neuropathy for which Taxol dose was reduced by 25%. She completed 5 weeks of external beam whole pelvic radiation on August 2018.Her original tumor in 2018 was ER +1%. PR negative. MSI stable and HER-2 negative  She was under clinical surveillance and recently presented to [name] with symptoms of right lower quadrant abdominal pain which led to aCT abdomen which showed new peritoneal metastatic disease throughout the right abdomen and pelvis with index masses measuring 5.7 x 4.7 cm. Head CT scan also showed evidence of  hypermetabolic abdominal pelvic peritoneal metastases but no evidence of other sites of metastases.  Repeat omental biopsy confirms high grade serous carcinoma.Foundation 1 testing showed following alteration/biomarkers. Microsatellite status stable. EPH for be amplification, ESR 1 amplification, HNF1 A G 292 FS 25, follow-up 1 amplification, with 3 ER 1 loss, PPP 2R1 AAS 256F, PTEN loss, T p53. However none of these mutations have actionable targets.  Repeat echocardiogram showed EF of 30 to 35%. Cardiac cath unremarkable consistent with nonischemic cardiomyopathy.Therefore Doxil not chosen and she would be getting carboplatin and gemcitabine. Chemotherapy complicated by significant anemia causing supply demand cardiac ischemia and hospitalization.Patient switched to [facility] and lenvatinib starting 01/11/2020   Interval history- she has not had any further hemoptysis. Feels very nauseous today although she has not had nausea in the preceding days. Bowel movements are regular. Fatigue persists. She is still needing oxygen especially with ambulation  ECOG PS- 2-3 Pain scale- 0   Review of systems- Review of Systems  Constitutional: Positive for malaise/fatigue. Negative for chills, fever and weight loss.  HENT: Negative for congestion, ear discharge and nosebleeds.   Eyes: Negative for blurred vision.  Respiratory: Negative for cough, hemoptysis, sputum production, shortness of breath and wheezing.   Cardiovascular: Negative for chest pain, palpitations, orthopnea and claudication.  Gastrointestinal: Positive for nausea and vomiting. Negative for abdominal pain, blood in stool, constipation, diarrhea, heartburn and melena.  Genitourinary: Negative for dysuria, flank pain, frequency, hematuria and urgency.  Musculoskeletal: Negative for back pain, joint pain and myalgias.  Skin: Negative for rash.  Neurological: Negative for dizziness, tingling, focal weakness, seizures, weakness and  headaches.  Endo/Heme/Allergies: Does not bruise/bleed easily.  Psychiatric/Behavioral: Negative for depression and suicidal ideas. The patient does not have insomnia.        Allergies  Allergen Reactions  . Adhesive [  Tape] Other (See Comments)    Skin Irritation   . Antifungal [Miconazole Nitrate] Rash  . Sulfa Antibiotics Nausea And Vomiting and Rash  . Z-Pak [Azithromycin] Itching     Past Medical History:  Diagnosis Date  . Anxiety   . Cervicalgia   . CHF (congestive heart failure) ([place])   . Chronic kidney disease   . Coronary artery disease    a. 02/2012 Stress echo: severe anterior wall ischemia;  b. 02/2012 Cath/PCI: LAD 95p (3.0 x 15 Xience EX DES), D1 90ost (PTCA - bifurcational dzs), EF 45% with anterior HK;  b. 02/2013 Ex MV: fixed anterior defect w/ minor reversibility, nl EF-->Med Rx.  . Endometrial cancer ([place])    a. 07/2016 s/p robotic hysterectomy, BSO w/ washings, sentinel node inj, mapping, bx, adhesiolysis.  . Essential hypertension, benign   . Fibrocystic breast disease   . GERD (gastroesophageal reflux disease)   . Gestational hypertension   . Heart murmur   . History of anemia   . History of blood transfusion   . Hodgkin's lymphoma ([place]) 2011   a. s/p radiation and chemo therapy  . Osteoarthritis   . Polycystic ovarian disease      Past Surgical History:  Procedure Laterality Date  . ABDOMINAL HYSTERECTOMY    . bladder sling    . CARDIAC CATHETERIZATION  02/2012   [facility] 1 stent place  . CERVICAL POLYPECTOMY    . CHOLECYSTECTOMY  1982  . COLONOSCOPY WITH PROPOFOL N/A 02/05/2015   Procedure: COLONOSCOPY WITH PROPOFOL;  Surgeon: Lucilla Lame, MD;  Location: [facility] ENDOSCOPY;  Service: Endoscopy;  Laterality: N/A;  . CORONARY ANGIOPLASTY  02/2012   left/right s/p balloon  . CYSTOGRAM N/A 08/17/2016   Procedure: CYSTOGRAM;  Surgeon: Hollice Espy, MD;  Location: [facility] ORS;  Service: Urology;  Laterality: N/A;  . CYSTOSCOPY N/A 08/17/2016   Procedure:  CYSTOSCOPY EXAM UNDER ANESTHESIA;  Surgeon: Hollice Espy, MD;  Location: [facility] ORS;  Service: Urology;  Laterality: N/A;  . CYSTOSCOPY W/ RETROGRADES Bilateral 08/17/2016   Procedure: CYSTOSCOPY WITH RETROGRADE PYELOGRAM;  Surgeon: Hollice Espy, MD;  Location: [facility] ORS;  Service: Urology;  Laterality: Bilateral;  . CYSTOSCOPY WITH STENT PLACEMENT Right 08/17/2016   Procedure: CYSTOSCOPY WITH STENT PLACEMENT;  Surgeon: Hollice Espy, MD;  Location: [facility] ORS;  Service: Urology;  Laterality: Right;  . heart stent'  2013  . kidney stent Right 2018  . LYMPH NODE BIOPSY  2011   diagnosis of hodgkins lymphoma  . PELVIC LYMPH NODE DISSECTION N/A 07/29/2016   Procedure: PELVIC/AORTIC LYMPH NODE SAMPLING;  Surgeon: [name], MD;  Location: [facility] ORS;  Service: Gynecology;  Laterality: N/A;  . PORTA CATH INSERTION N/A 09/22/2016   Procedure: Glori Luis Cath Insertion;  Surgeon: [name], MD;  Location: [place] [facility];  Service: Cardiovascular;  Laterality: N/A;  . PORTA CATH INSERTION N/A 10/27/2019   Procedure: PORTA CATH INSERTION;  Surgeon: [name], MD;  Location: [place] [facility];  Service: Cardiovascular;  Laterality: N/A;  . PORTA CATH REMOVAL N/A 11/17/2016   Procedure: Glori Luis Cath Removal;  Surgeon: [name], MD;  Location: [place] [facility];  Service: Cardiovascular;  Laterality: N/A;  . RIGHT/LEFT HEART CATH AND CORONARY ANGIOGRAPHY N/A 12/04/2019   Procedure: RIGHT/LEFT HEART CATH AND CORONARY ANGIOGRAPHY;  Surgeon: Wellington Hampshire, MD;  Location: [place] [facility];  Service: Cardiovascular;  Laterality: N/A;  . ROBOTIC ASSISTED TOTAL HYSTERECTOMY WITH BILATERAL SALPINGO OOPHERECTOMY N/A 07/29/2016   Procedure:  ROBOTIC ASSISTED TOTAL HYSTERECTOMY WITH BILATERAL SALPINGO OOPHORECTOMY;  Surgeon: [name], MD;  Location: [facility] ORS;  Service: Gynecology;  Laterality: N/A;  . SENTINEL NODE BIOPSY N/A 07/29/2016   Procedure: SENTINEL NODE  BIOPSY;  Surgeon: [name], MD;  Location: [facility] ORS;  Service: Gynecology;  Laterality: N/A;  . transobturator sling N/A 2009   [place]    Social History   Socioeconomic History  . Marital status: Widowed    Spouse name: Not on file  . Number of children: Not on file  . Years of education: Not on file  . Highest education level: Not on file  Occupational History  . Not on file  Tobacco Use  . Smoking status: Former Smoker    Packs/day: 1.00    Years: 30.00    Pack years: 30.00    Types: Cigarettes    Quit date: 07/24/2002    Years since quitting: 17.6  . Smokeless tobacco: Never Used  . Tobacco comment: quit smoking in 2000  Vaping Use  . Vaping Use: Never used  Substance and Sexual Activity  . Alcohol use: Not Currently  . Drug use: No  . Sexual activity: Never  Other Topics Concern  . 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    Social Determinants of Health   Financial Resource Strain:   . Difficulty of Paying Living Expenses: Not on file  Food Insecurity:   . Worried About Charity fundraiser in the Last Year: Not on file  . Ran Out of Food in the Last Year: Not on file  Transportation Needs:   . Lack of Transportation (Medical): Not on file  . Lack of Transportation (Non-Medical): Not on file  Physical Activity:   . Days of Exercise per Week: Not on file  . Minutes of Exercise per Session: Not on file  Stress:   . Feeling of Stress : Not on file  Social Connections:   . Frequency of Communication with Friends and Family: Not on file  . Frequency of Social Gatherings with Friends and Family: Not on file  . Attends Religious Services: Not on file  . Active Member of Clubs or Organizations: Not on file  . Attends Archivist Meetings: Not on file  . Marital Status: Not on file  Intimate Partner Violence:   . Fear of Current or Ex-Partner: Not on file   . Emotionally Abused: Not on file  . Physically Abused: Not on file  . Sexually Abused: Not on file    Family History  Problem Relation Age of Onset  . ALS Father   . Polymyositis Father   . Diabetes Brother   . Cancer Maternal Aunt        breast  . Breast cancer Maternal Aunt        30's  . Stroke Maternal Grandmother   . Cancer Maternal Grandfather        prostate  . Stroke Maternal Grandfather   . Colon cancer Maternal Aunt   . Non-Hodgkin's lymphoma Cousin      Current Outpatient Medications:  .  apixaban (ELIQUIS) 2.5 MG TABS tablet, Take 1 tablet (2.5 mg total) by mouth 2 (two) times daily., Disp: 60 tablet, Rfl: 1 .  atorvastatin (LIPITOR) 10 MG tablet, Take 1 tablet by mouth once daily (Patient taking differently: Take 10 mg by mouth every evening. ),  Disp: 90 tablet, Rfl: 2 .  carvedilol (COREG) 3.125 MG tablet, Take 1 tablet (3.125 mg total) by mouth 2 (two) times daily with a meal., Disp: 60 tablet, Rfl: 2 .  cholecalciferol (VITAMIN D3) 25 MCG (1000 UNIT) tablet, Take 1,000 Units by mouth in the morning and at bedtime., Disp: , Rfl:  .  cyanocobalamin (,VITAMIN B-12,) 1000 MCG/ML injection, INJECT 1ML IM WEEKLY FOR 4 WEEKS, THEN INJECT MONTHLY THEREAFTER (Patient taking differently: Inject 1,000 mcg into the skin every 30 (thirty) days. ), Disp: 10 mL, Rfl: 0 .  DULoxetine (CYMBALTA) 30 MG capsule, Take 1 capsule (30 mg total) by mouth daily., Disp: 90 capsule, Rfl: 2 .  furosemide (LASIX) 20 MG tablet, Take 1 tablet (20 mg total) by mouth as needed for fluid or edema (Take one tablet daily as needed for lower extremity edema or for weight gain of 2 lbs overnight or 5 lbs in one week.). (Patient taking differently: Take 20 mg by mouth daily. ), Disp: 30 tablet, Rfl: 2 .  levothyroxine (SYNTHROID) 75 MCG tablet, Take 1 tablet (75 mcg total) by mouth daily before breakfast., Disp: 30 tablet, Rfl: 0 .  LORazepam (ATIVAN) 0.5 MG tablet, Take 1 tablet (0.5 mg total) by mouth  every 6 (six) hours as needed for anxiety (and to help with breathing)., Disp: 120 tablet, Rfl: 0 .  losartan (COZAAR) 25 MG tablet, Take 0.5 tablets (12.5 mg total) by mouth daily., Disp: 15 tablet, Rfl: 2 .  Morphine Sulfate (MORPHINE CONCENTRATE) 10 mg / 0.5 ml concentrated solution, Take 0.25 mLs (5 mg total) by mouth every 6 (six) hours as needed for severe pain or shortness of breath., Disp: 30 mL, Rfl: 0 .  pantoprazole (PROTONIX) 20 MG tablet, Take 1 tablet (20 mg total) by mouth daily., Disp: 60 tablet, Rfl: 1 .  potassium chloride (KLOR-CON) 10 MEQ tablet, Take 1 tablet (10 mEq total) by mouth daily., Disp: 90 tablet, Rfl: 1 No current facility-administered medications for this visit.  Facility-Administered Medications Ordered in Other Visits:  .  heparin lock flush 100 unit/mL, 500 Units, Intravenous, Once, Randa Evens C, MD .  sodium chloride flush (NS) 0.9 % injection 10 mL, 10 mL, Intravenous, PRN, Sindy Guadeloupe, MD, 10 mL at 12/07/19 0914 .  sodium chloride flush (NS) 0.9 % injection 10 mL, 10 mL, Intravenous, PRN, Sindy Guadeloupe, MD, 10 mL at 01/04/20 0850  Physical exam:  Vitals:   03/12/20 0945  BP: (!) 98/49  Pulse: 94  Resp: 16  Temp: (!) 96.6 F (35.9 C)  TempSrc: Tympanic  SpO2: 100%  Weight: 148 lb 11.2 oz (67.4 kg)   Physical Exam Constitutional:      Comments: Appears fatigued and nauseous  HENT:     Head: Normocephalic and atraumatic.  Eyes:     Pupils: Pupils are equal, round, and reactive to light.  Cardiovascular:     Rate and Rhythm: Normal rate and regular rhythm.     Heart sounds: Normal heart sounds.  Pulmonary:     Effort: Pulmonary effort is normal.     Breath sounds: Normal breath sounds.  Abdominal:     General: Bowel sounds are normal.     Palpations: Abdomen is soft.  Musculoskeletal:     Cervical back: Normal range of motion.  Skin:    General: Skin is warm and dry.  Neurological:     Mental Status: She is alert and oriented to  person, place, and time.  CMP Latest Ref Rng & Units 03/12/2020  Glucose 70 - 99 mg/dL 124(H)  BUN 8 - 23 mg/dL 19  Creatinine 0.44 - 1.00 mg/dL 1.77(H)  Sodium 135 - 145 mmol/L 139  Potassium 3.5 - 5.1 mmol/L 3.3(L)  Chloride 98 - 111 mmol/L 99  CO2 22 - 32 mmol/L 30  Calcium 8.9 - 10.3 mg/dL 8.7(L)  Total Protein 6.5 - 8.1 g/dL 6.4(L)  Total Bilirubin 0.3 - 1.2 mg/dL 1.4(H)  Alkaline Phos 38 - 126 U/L 69  AST 15 - 41 U/L 23  ALT 0 - 44 U/L 13   CBC Latest Ref Rng & Units 03/12/2020  WBC 4.0 - 10.5 K/uL 3.9(L)  Hemoglobin 12.0 - 15.0 g/dL 9.5(L)  Hematocrit 36 - 46 % 30.3(L)  Platelets 150 - 400 K/uL 166    No images are attached to the encounter.  US Venous Img Lower Bilateral (DVT)  Result Date: 02/20/2020 CLINICAL DATA:  PE EXAM: BILATERAL LOWER EXTREMITY VENOUS DOPPLER ULTRASOUND TECHNIQUE: Gray-scale sonography with compression, as well as color and duplex ultrasound, were performed to evaluate the deep venous system(s) from the level of the common femoral vein through the popliteal and proximal calf veins. COMPARISON:  None. FINDINGS: VENOUS Normal compressibility of the common femoral, superficial femoral, and popliteal veins, as well as the visualized calf veins (peroneal vein not well visualized on the left). Visualized portions of profunda femoral vein and great saphenous vein unremarkable. No filling defects to suggest DVT on grayscale or color Doppler imaging. Doppler waveforms show normal direction of venous flow, normal respiratory plasticity and response to augmentation. OTHER None. Limitations: none IMPRESSION: No evidence of DVT in the lower extremities. Electronically Signed   By: Macy Mis M.D.   On: 02/20/2020 12:08   DG Chest Portable 1 View  Result Date: 02/19/2020 CLINICAL DATA:  Pt arrives via [facility] from home with complaint of SOB. Pt low 70's% on RA, pt with blue lips. EMS administered 1 duoneb and placed on 8L NRB, spo2 100%. Pt tachy in the low 100's  for HR EXAM: PORTABLE CHEST 1 VIEW COMPARISON:  02/13/2020 FINDINGS: Hazy opacities are noted in both lungs. Scarring is noted extending from the hila into the medial upper lobes. No convincing pleural effusion and no pneumothorax. Right internal jugular central venous catheter is stable, tip in the lower superior vena cava. Cardiac silhouette is mildly enlarged. No mediastinal or hilar masses. No evidence of adenopathy. Skeletal structures are grossly intact. IMPRESSION: 1. Hazy opacity is noted in lungs, representing a change from the most recent prior chest radiograph. Findings may be due to multifocal pneumonia or pulmonary edema. Electronically Signed   By: Lajean Manes M.D.   On: 02/19/2020 21:41     Assessment and plan- Patient is a 69 y.o. female recurrent high-grade serous endometrial carcinoma on second line lenvatinib and Keytruda chemotherapy complicated by repeated episodes of acute on chronic congestive heart failure as well as recent segmental PE. This is a continued follow up visit to discuss goals of care  Patient remains fatigued and deconditioned. She is oxygen dependant. While on treatment she was hospitalized thrice for worsening sob possibly secondary to heart failure. We discussed pursuing home hospice versus attempting single agent keytruda and if she tolerates it consider adding lenvatinib subsequently. CA 125 has risen without any treatment in the last 6 weeeks concerning for disease progression. Patient lives alone and is finding it difficult to make up her mind. She wants to discuss her options with her  children this week.  We will call her next week to follow up on her decision  Nausea- only since this morning. Otherwise reports regular BM. appetiet has been fair. I do nto suspect malignant bowel obstruction clinically. We will give her IVF and IV decadron and compazine  She will continue eliquis 2.5 mg bID for the recent PE.   Visit Diagnosis 1. Endometrial cancer ([place])    2. Peritoneal carcinomatosis ([place])   3. Goals of care, counseling/discussion      Dr. Randa Evens, MD, MPH [facility] [phone] 03/17/2020 7:54 AM

## 2020-03-18 ENCOUNTER — Telehealth: Payer: Self-pay

## 2020-03-18 ENCOUNTER — Other Ambulatory Visit: Payer: Self-pay | Admitting: *Deleted

## 2020-03-18 ENCOUNTER — Encounter: Payer: Self-pay | Admitting: Family

## 2020-03-18 MED ORDER — PANTOPRAZOLE SODIUM 20 MG PO TBEC
20.0000 mg | DELAYED_RELEASE_TABLET | Freq: Every day | ORAL | 1 refills | Status: DC
Start: 2020-03-18 — End: 2020-05-17

## 2020-03-18 NOTE — Telephone Encounter (Signed)
Patient called reporting that she needs a refill of her pantoprazole and to ask about dizziness since starting the blood thinner.. She is asking if it is her b/p or maybe she is not drinking enough.

## 2020-03-18 NOTE — Telephone Encounter (Signed)
I spoke to pt and she is not dizzy today. Her b/p have been ranging from systolic 45'H to 460 then diastolic 49 to 70. She only drink 2 bottles of water yest. I told her that she should drink 4 bottles of water a day and other liq. She has a call in to cardiology-caitlyn about her medicine through their office. She has a video visit with palliative care 10/18 and Dr. [name] 10/19. Today is the last day she has home health from being in hospital. She will check her vitals when she comes today. Pt wants to know if she needs future appt with dr Janese Banks. She does not feel that she is hospice at this time. But she she still does not want to get [name]. I will check with Janese Banks

## 2020-03-18 NOTE — Telephone Encounter (Signed)
Jaime Hall with [facility] called and states that pt was discharged from home health today

## 2020-03-18 NOTE — Telephone Encounter (Signed)
FYI

## 2020-03-18 NOTE — Telephone Encounter (Signed)
What about her dizziness?

## 2020-03-19 ENCOUNTER — Encounter: Payer: Self-pay | Admitting: Oncology

## 2020-03-25 ENCOUNTER — Encounter: Payer: Self-pay | Admitting: *Deleted

## 2020-03-26 ENCOUNTER — Encounter: Payer: Self-pay | Admitting: Oncology

## 2020-03-29 ENCOUNTER — Inpatient Hospital Stay: Payer: PPO | Attending: Oncology

## 2020-03-29 ENCOUNTER — Inpatient Hospital Stay (HOSPITAL_BASED_OUTPATIENT_CLINIC_OR_DEPARTMENT_OTHER): Payer: PPO | Admitting: Oncology

## 2020-03-29 ENCOUNTER — Other Ambulatory Visit: Payer: Self-pay

## 2020-03-29 VITALS — BP 94/54 | HR 92 | Temp 96.5°F | Resp 16 | Wt 151.0 lb

## 2020-03-29 DIAGNOSIS — Z170 Estrogen receptor positive status [ER+]: Secondary | ICD-10-CM | POA: Diagnosis not present

## 2020-03-29 DIAGNOSIS — C786 Secondary malignant neoplasm of retroperitoneum and peritoneum: Secondary | ICD-10-CM | POA: Diagnosis not present

## 2020-03-29 DIAGNOSIS — F419 Anxiety disorder, unspecified: Secondary | ICD-10-CM | POA: Insufficient documentation

## 2020-03-29 DIAGNOSIS — Z881 Allergy status to other antibiotic agents status: Secondary | ICD-10-CM | POA: Diagnosis not present

## 2020-03-29 DIAGNOSIS — Z803 Family history of malignant neoplasm of breast: Secondary | ICD-10-CM | POA: Insufficient documentation

## 2020-03-29 DIAGNOSIS — T451X5A Adverse effect of antineoplastic and immunosuppressive drugs, initial encounter: Secondary | ICD-10-CM | POA: Insufficient documentation

## 2020-03-29 DIAGNOSIS — I2510 Atherosclerotic heart disease of native coronary artery without angina pectoris: Secondary | ICD-10-CM | POA: Diagnosis not present

## 2020-03-29 DIAGNOSIS — N1831 Chronic kidney disease, stage 3a: Secondary | ICD-10-CM | POA: Diagnosis not present

## 2020-03-29 DIAGNOSIS — Z79899 Other long term (current) drug therapy: Secondary | ICD-10-CM | POA: Insufficient documentation

## 2020-03-29 DIAGNOSIS — Z882 Allergy status to sulfonamides status: Secondary | ICD-10-CM | POA: Diagnosis not present

## 2020-03-29 DIAGNOSIS — R110 Nausea: Secondary | ICD-10-CM | POA: Insufficient documentation

## 2020-03-29 DIAGNOSIS — R1031 Right lower quadrant pain: Secondary | ICD-10-CM | POA: Insufficient documentation

## 2020-03-29 DIAGNOSIS — Z7901 Long term (current) use of anticoagulants: Secondary | ICD-10-CM | POA: Diagnosis not present

## 2020-03-29 DIAGNOSIS — D6959 Other secondary thrombocytopenia: Secondary | ICD-10-CM

## 2020-03-29 DIAGNOSIS — Z923 Personal history of irradiation: Secondary | ICD-10-CM | POA: Diagnosis not present

## 2020-03-29 DIAGNOSIS — Z87891 Personal history of nicotine dependence: Secondary | ICD-10-CM | POA: Diagnosis not present

## 2020-03-29 DIAGNOSIS — C541 Malignant neoplasm of endometrium: Secondary | ICD-10-CM

## 2020-03-29 DIAGNOSIS — Z808 Family history of malignant neoplasm of other organs or systems: Secondary | ICD-10-CM | POA: Insufficient documentation

## 2020-03-29 DIAGNOSIS — E038 Other specified hypothyroidism: Secondary | ICD-10-CM | POA: Insufficient documentation

## 2020-03-29 DIAGNOSIS — R5383 Other fatigue: Secondary | ICD-10-CM | POA: Insufficient documentation

## 2020-03-29 DIAGNOSIS — D649 Anemia, unspecified: Secondary | ICD-10-CM | POA: Insufficient documentation

## 2020-03-29 DIAGNOSIS — Z7189 Other specified counseling: Secondary | ICD-10-CM | POA: Diagnosis not present

## 2020-03-29 DIAGNOSIS — Z9049 Acquired absence of other specified parts of digestive tract: Secondary | ICD-10-CM | POA: Insufficient documentation

## 2020-03-29 DIAGNOSIS — Z90722 Acquired absence of ovaries, bilateral: Secondary | ICD-10-CM | POA: Insufficient documentation

## 2020-03-29 DIAGNOSIS — G620 Drug-induced polyneuropathy: Secondary | ICD-10-CM | POA: Diagnosis not present

## 2020-03-29 DIAGNOSIS — Z888 Allergy status to other drugs, medicaments and biological substances status: Secondary | ICD-10-CM | POA: Insufficient documentation

## 2020-03-29 DIAGNOSIS — Z5112 Encounter for antineoplastic immunotherapy: Secondary | ICD-10-CM

## 2020-03-29 DIAGNOSIS — I509 Heart failure, unspecified: Secondary | ICD-10-CM | POA: Diagnosis not present

## 2020-03-29 DIAGNOSIS — Z820 Family history of epilepsy and other diseases of the nervous system: Secondary | ICD-10-CM | POA: Insufficient documentation

## 2020-03-29 DIAGNOSIS — Z833 Family history of diabetes mellitus: Secondary | ICD-10-CM | POA: Insufficient documentation

## 2020-03-29 DIAGNOSIS — Z8571 Personal history of Hodgkin lymphoma: Secondary | ICD-10-CM | POA: Insufficient documentation

## 2020-03-29 DIAGNOSIS — Z800 Family history of malignant neoplasm of digestive organs: Secondary | ICD-10-CM | POA: Insufficient documentation

## 2020-03-29 DIAGNOSIS — I130 Hypertensive heart and chronic kidney disease with heart failure and stage 1 through stage 4 chronic kidney disease, or unspecified chronic kidney disease: Secondary | ICD-10-CM | POA: Insufficient documentation

## 2020-03-29 DIAGNOSIS — Z823 Family history of stroke: Secondary | ICD-10-CM | POA: Insufficient documentation

## 2020-03-29 DIAGNOSIS — Z90710 Acquired absence of both cervix and uterus: Secondary | ICD-10-CM | POA: Insufficient documentation

## 2020-03-29 LAB — COMPREHENSIVE METABOLIC PANEL
ALT: 12 U/L (ref 0–44)
AST: 20 U/L (ref 15–41)
Albumin: 2.9 g/dL — ABNORMAL LOW (ref 3.5–5.0)
Alkaline Phosphatase: 72 U/L (ref 38–126)
Anion gap: 9 (ref 5–15)
BUN: 16 mg/dL (ref 8–23)
CO2: 27 mmol/L (ref 22–32)
Calcium: 8.5 mg/dL — ABNORMAL LOW (ref 8.9–10.3)
Chloride: 102 mmol/L (ref 98–111)
Creatinine, Ser: 1.37 mg/dL — ABNORMAL HIGH (ref 0.44–1.00)
GFR, Estimated: 39 mL/min — ABNORMAL LOW (ref >60)
Glucose, Bld: 140 mg/dL — ABNORMAL HIGH (ref 70–99)
Potassium: 3.8 mmol/L (ref 3.5–5.1)
Sodium: 138 mmol/L (ref 135–145)
Total Bilirubin: 1.4 mg/dL — ABNORMAL HIGH (ref 0.3–1.2)
Total Protein: 5.9 g/dL — ABNORMAL LOW (ref 6.5–8.1)

## 2020-03-29 LAB — CBC WITH DIFFERENTIAL/PLATELET
Abs Immature Granulocytes: 0.03 10*3/uL (ref 0.00–0.07)
Basophils Absolute: 0 10*3/uL (ref 0.0–0.1)
Basophils Relative: 0 %
Eosinophils Absolute: 0.1 10*3/uL (ref 0.0–0.5)
Eosinophils Relative: 2 %
HCT: 27.4 % — ABNORMAL LOW (ref 36.0–46.0)
Hemoglobin: 8.7 g/dL — ABNORMAL LOW (ref 12.0–15.0)
Immature Granulocytes: 1 %
Lymphocytes Relative: 17 %
Lymphs Abs: 0.8 10*3/uL (ref 0.7–4.0)
MCH: 30 pg (ref 26.0–34.0)
MCHC: 31.8 g/dL (ref 30.0–36.0)
MCV: 94.5 fL (ref 80.0–100.0)
Monocytes Absolute: 0.4 10*3/uL (ref 0.1–1.0)
Monocytes Relative: 8 %
Neutro Abs: 3.5 10*3/uL (ref 1.7–7.7)
Neutrophils Relative %: 72 %
Platelets: 157 10*3/uL (ref 150–400)
RBC: 2.9 MIL/uL — ABNORMAL LOW (ref 3.87–5.11)
RDW: 14.8 % (ref 11.5–15.5)
WBC: 4.8 10*3/uL (ref 4.0–10.5)
nRBC: 0 % (ref 0.0–0.2)

## 2020-04-01 ENCOUNTER — Encounter: Payer: Self-pay | Admitting: Hospice and Palliative Medicine

## 2020-04-01 ENCOUNTER — Inpatient Hospital Stay (HOSPITAL_BASED_OUTPATIENT_CLINIC_OR_DEPARTMENT_OTHER): Payer: PPO | Admitting: Hospice and Palliative Medicine

## 2020-04-01 DIAGNOSIS — Z515 Encounter for palliative care: Secondary | ICD-10-CM | POA: Diagnosis not present

## 2020-04-01 DIAGNOSIS — C541 Malignant neoplasm of endometrium: Secondary | ICD-10-CM

## 2020-04-01 NOTE — Progress Notes (Signed)
[place]  [phone] [phone]   Name: Jaime Hall Date: 04/01/2020 MRN: [id number]  DOB: 11-Sep-1950  Patient Care Team: [name], MD as PCP - General (Internal Medicine) Wellington Hampshire, MD as PCP - Cardiology (Cardiology) [name], MD as Consulting Physician (General Surgery) Mellody Drown, MD as Referring Physician (Obstetrics and Gynecology) [name], MD as Referring Physician (Obstetrics and Gynecology) Hollice Espy, MD as Consulting Physician (Urology) [name], RN as Registered Nurse Sindy Guadeloupe, MD as Consulting Physician (Oncology)    REASON FOR CONSULTATION: Jaime Hall is a 69 y.o. 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 Patient was originally diagnosed in 2018 with stage I endometrial cancer and underwent TAH/BSO and adjuvant chemotherapy but unfortunately was found to have disease recurrence in May 2021.  Patient was hospitalized 02/14/2020-02/15/2020 with acute hypoxic respiratory failure and found to have acute segmental pulmonary embolus.  She was readmitted 02/20/2020 - 02/22/2020 with hemoptysis and acute on chronic respiratory failure secondary to CHF exacerbation +/- multifocal PNA.  Palliative care was consulted to help address goals.   SOCIAL HISTORY:     reports that she quit smoking about 17 years ago. Her smoking use included cigarettes. She has a 30.00 pack-year smoking history. She has never used smokeless tobacco. She reports previous alcohol use. She reports that she does not use drugs.  Patient is widowed x3 years.  She lives at home alone with her cat.  She has two sons who live nearby.  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 Until recently, she was still working four hours per day when able.     ADVANCE DIRECTIVES:  Not on file  CODE STATUS: DNR  PAST MEDICAL HISTORY: Past Medical History:  Diagnosis Date   Anxiety    Cervicalgia    CHF (congestive heart failure) ([place])    Chronic kidney disease    Coronary artery disease    a. 02/2012 Stress echo: severe anterior wall ischemia;  b. 02/2012 Cath/PCI: LAD 95p (3.0 x 15 Xience EX DES), D1 90ost (PTCA - bifurcational dzs), EF 45% with anterior HK;  b. 02/2013 Ex MV: fixed anterior defect w/ minor reversibility, nl EF-->Med Rx.   Endometrial cancer ([place])    a. 07/2016 s/p robotic hysterectomy, BSO w/ washings, sentinel node inj, mapping, bx, adhesiolysis.   Essential hypertension, benign    Fibrocystic breast disease    GERD (gastroesophageal reflux disease)    Gestational hypertension    Heart murmur    History of anemia    History of blood transfusion    Hodgkin's lymphoma ([place]) 2011   a. s/p radiation and chemo therapy   Osteoarthritis    Polycystic ovarian disease     PAST SURGICAL HISTORY:  Past Surgical History:  Procedure Laterality Date   ABDOMINAL HYSTERECTOMY     bladder sling     CARDIAC CATHETERIZATION  02/2012   [facility] 1 stent place   CERVICAL POLYPECTOMY     CHOLECYSTECTOMY  1982   COLONOSCOPY WITH PROPOFOL N/A 02/05/2015   Procedure: COLONOSCOPY WITH PROPOFOL;  Surgeon: Lucilla Lame, MD;  Location: [facility];  Service: Endoscopy;  Laterality: N/A;   CORONARY ANGIOPLASTY  02/2012   left/right s/p balloon   CYSTOGRAM N/A 08/17/2016   Procedure: CYSTOGRAM;  Surgeon: Hollice Espy, MD;  Location: [facility];  Service:  Urology;  Laterality: N/A;   CYSTOSCOPY N/A 08/17/2016   Procedure: CYSTOSCOPY EXAM UNDER ANESTHESIA;  Surgeon: Hollice Espy, MD;  Location: [facility];  Service: Urology;  Laterality: N/A;   CYSTOSCOPY W/ RETROGRADES Bilateral 08/17/2016   Procedure: CYSTOSCOPY WITH RETROGRADE PYELOGRAM;  Surgeon: Hollice Espy, MD;  Location: [facility];  Service: Urology;  Laterality:  Bilateral;   CYSTOSCOPY WITH STENT PLACEMENT Right 08/17/2016   Procedure: CYSTOSCOPY WITH STENT PLACEMENT;  Surgeon: Hollice Espy, MD;  Location: [facility];  Service: Urology;  Laterality: Right;   heart stent'  2013   kidney stent Right 2018   LYMPH NODE BIOPSY  2011   diagnosis of hodgkins lymphoma   PELVIC LYMPH NODE DISSECTION N/A 07/29/2016   Procedure: PELVIC/AORTIC LYMPH NODE SAMPLING;  Surgeon: [name], MD;  Location: [facility];  Service: Gynecology;  Laterality: N/A;   PORTA CATH INSERTION N/A 09/22/2016   Procedure: Glori Luis Cath Insertion;  Surgeon: [name], MD;  Location: [place] [facility];  Service: Cardiovascular;  Laterality: N/A;   PORTA CATH INSERTION N/A 10/27/2019   Procedure: PORTA CATH INSERTION;  Surgeon: [name], MD;  Location: [place] [facility];  Service: Cardiovascular;  Laterality: N/A;   PORTA CATH REMOVAL N/A 11/17/2016   Procedure: Glori Luis Cath Removal;  Surgeon: [name], MD;  Location: [place] [facility];  Service: Cardiovascular;  Laterality: N/A;   RIGHT/LEFT HEART CATH AND CORONARY ANGIOGRAPHY N/A 12/04/2019   Procedure: RIGHT/LEFT HEART CATH AND CORONARY ANGIOGRAPHY;  Surgeon: Wellington Hampshire, MD;  Location: [place] [facility];  Service: Cardiovascular;  Laterality: N/A;   ROBOTIC ASSISTED TOTAL HYSTERECTOMY WITH BILATERAL SALPINGO OOPHERECTOMY N/A 07/29/2016   Procedure: ROBOTIC ASSISTED TOTAL HYSTERECTOMY WITH BILATERAL SALPINGO OOPHORECTOMY;  Surgeon: [name], MD;  Location: [facility];  Service: Gynecology;  Laterality: N/A;   SENTINEL NODE BIOPSY N/A 07/29/2016   Procedure: SENTINEL NODE BIOPSY;  Surgeon: [name], MD;  Location: [facility];  Service: Gynecology;  Laterality: N/A;   transobturator sling N/A 2009   [place]    HEMATOLOGY/ONCOLOGY HISTORY:  Oncology History  Endometrial cancer ([place])  08/13/2016 Initial Diagnosis   Endometrial cancer ([place])   11/06/2019 -  12/28/2019 Chemotherapy   The patient had palonosetron (ALOXI) injection 0.25 mg, 0.25 mg, Intravenous,  Once, 3 of 4 cycles Administration: 0.25 mg (11/14/2019), 0.25 mg (12/07/2019), 0.25 mg (12/14/2019), 0.25 mg (12/28/2019) pegfilgrastim (NEULASTA ONPRO KIT) injection 6 mg, 6 mg, Subcutaneous, Once, 3 of 4 cycles Administration: 6 mg (11/14/2019), 6 mg (12/14/2019) CARBOplatin (PARAPLATIN) 140 mg in sodium chloride 0.9 % 100 mL chemo infusion, 140 mg (100 % of original dose 141 mg), Intravenous,  Once, 3 of 4 cycles Dose modification:   (original dose 141 mg, Cycle 1) Administration: 140 mg (11/06/2019), 110 mg (12/07/2019), 150 mg (11/14/2019), 110 mg (12/14/2019), 110 mg (12/28/2019) gemcitabine (GEMZAR) 1,400 mg in sodium chloride 0.9 % 250 mL chemo infusion, 1,444 mg (100 % of original dose 800 mg/m2), Intravenous,  Once, 3 of 4 cycles Dose modification: 800 mg/m2 (original dose 800 mg/m2, Cycle 1, Reason: Patient Age) Administration: 1,400 mg (11/06/2019), 1,400 mg (11/14/2019), 1,400 mg (12/07/2019), 1,400 mg (12/14/2019), 1,400 mg (12/28/2019)  for chemotherapy treatment.    01/11/2020 -  Chemotherapy   The patient had lenvatinib 14 mg daily dose (LENVIMA, 14 MG DAILY DOSE,) 10 & 4 MG capsule, 14 mg, Oral, Daily, 1 of 1 cycle, Start date: --, End date: -- pembrolizumab (KEYTRUDA) 200 mg in sodium  chloride 0.9 % 50 mL chemo infusion, 200 mg, Intravenous, Once, 2 of 6 cycles Administration: 200 mg (01/11/2020), 200 mg (02/01/2020)  for chemotherapy treatment.    Peritoneal metastases ([place])  10/16/2019 Initial Diagnosis   Peritoneal metastases ([place])   11/06/2019 - 12/28/2019 Chemotherapy   The patient had palonosetron (ALOXI) injection 0.25 mg, 0.25 mg, Intravenous,  Once, 3 of 4 cycles Administration: 0.25 mg (11/14/2019), 0.25 mg (12/07/2019), 0.25 mg (12/14/2019), 0.25 mg (12/28/2019) pegfilgrastim (NEULASTA ONPRO KIT) injection 6 mg, 6 mg, Subcutaneous, Once, 3 of 4 cycles Administration: 6 mg (11/14/2019), 6 mg  (12/14/2019) CARBOplatin (PARAPLATIN) 140 mg in sodium chloride 0.9 % 100 mL chemo infusion, 140 mg (100 % of original dose 141 mg), Intravenous,  Once, 3 of 4 cycles Dose modification:   (original dose 141 mg, Cycle 1) Administration: 140 mg (11/06/2019), 110 mg (12/07/2019), 150 mg (11/14/2019), 110 mg (12/14/2019), 110 mg (12/28/2019) gemcitabine (GEMZAR) 1,400 mg in sodium chloride 0.9 % 250 mL chemo infusion, 1,444 mg (100 % of original dose 800 mg/m2), Intravenous,  Once, 3 of 4 cycles Dose modification: 800 mg/m2 (original dose 800 mg/m2, Cycle 1, Reason: Patient Age) Administration: 1,400 mg (11/06/2019), 1,400 mg (11/14/2019), 1,400 mg (12/07/2019), 1,400 mg (12/14/2019), 1,400 mg (12/28/2019)  for chemotherapy treatment.    01/11/2020 -  Chemotherapy   The patient had lenvatinib 14 mg daily dose (LENVIMA, 14 MG DAILY DOSE,) 10 & 4 MG capsule, 14 mg, Oral, Daily, 1 of 1 cycle, Start date: --, End date: -- pembrolizumab (KEYTRUDA) 200 mg in sodium chloride 0.9 % 50 mL chemo infusion, 200 mg, Intravenous, Once, 2 of 6 cycles Administration: 200 mg (01/11/2020), 200 mg (02/01/2020)  for chemotherapy treatment.      ALLERGIES:  is allergic to adhesive [tape], antifungal [miconazole nitrate], sulfa antibiotics, and z-pak [azithromycin].  MEDICATIONS:  Current Outpatient Medications  Medication Sig Dispense Refill   apixaban (ELIQUIS) 2.5 MG TABS tablet Take 1 tablet (2.5 mg total) by mouth 2 (two) times daily. 60 tablet 1   atorvastatin (LIPITOR) 10 MG tablet Take 1 tablet by mouth once daily (Patient taking differently: Take 10 mg by mouth every evening. ) 90 tablet 2   carvedilol (COREG) 3.125 MG tablet Take 1 tablet (3.125 mg total) by mouth 2 (two) times daily with a meal. 60 tablet 2   cholecalciferol (VITAMIN D3) 25 MCG (1000 UNIT) tablet Take 1,000 Units by mouth in the morning and at bedtime.     cyanocobalamin (,VITAMIN B-12,) 1000 MCG/ML injection INJECT 1ML IM WEEKLY FOR 4 WEEKS, THEN  INJECT MONTHLY THEREAFTER (Patient taking differently: Inject 1,000 mcg into the skin every 30 (thirty) days. ) 10 mL 0   DULoxetine (CYMBALTA) 30 MG capsule Take 1 capsule (30 mg total) by mouth daily. 90 capsule 2   furosemide (LASIX) 20 MG tablet Take 1 tablet (20 mg total) by mouth as needed for fluid or edema (Take one tablet daily as needed for lower extremity edema or for weight gain of 2 lbs overnight or 5 lbs in one week.). (Patient taking differently: Take 20 mg by mouth daily. ) 30 tablet 2   levothyroxine (SYNTHROID) 75 MCG tablet Take 1 tablet (75 mcg total) by mouth daily before breakfast. 30 tablet 0   LORazepam (ATIVAN) 0.5 MG tablet Take 1 tablet (0.5 mg total) by mouth every 6 (six) hours as needed for anxiety (and to help with breathing). 120 tablet 0   losartan (COZAAR) 25 MG tablet Take 0.5 tablets (12.5 mg  total) by mouth daily. 15 tablet 2   Morphine Sulfate (MORPHINE CONCENTRATE) 10 mg / 0.5 ml concentrated solution Take 0.25 mLs (5 mg total) by mouth every 6 (six) hours as needed for severe pain or shortness of breath. 30 mL 0   pantoprazole (PROTONIX) 20 MG tablet Take 1 tablet (20 mg total) by mouth daily. 60 tablet 1   potassium chloride (KLOR-CON) 10 MEQ tablet Take 1 tablet (10 mEq total) by mouth daily. 90 tablet 1   No current facility-administered medications for this visit.   Facility-Administered Medications Ordered in Other Visits  Medication Dose Route Frequency Provider Last Rate Last Admin   heparin lock flush 100 unit/mL  500 Units Intravenous Once Sindy Guadeloupe, MD       sodium chloride flush (NS) 0.9 % injection 10 mL  10 mL Intravenous PRN Sindy Guadeloupe, MD   10 mL at 12/07/19 0914   sodium chloride flush (NS) 0.9 % injection 10 mL  10 mL Intravenous PRN Sindy Guadeloupe, MD   10 mL at 01/04/20 0850    VITAL SIGNS: There were no vitals taken for this visit. There were no vitals filed for this visit.  Estimated body mass index is 27.62 kg/m  as calculated from the following:   Height as of 02/29/20: '5\' 2"'  (1.575 m).   Weight as of 03/29/20: 151 lb (68.5 kg).  LABS: CBC:    Component Value Date/Time   WBC 4.8 03/29/2020 1326   HGB 8.7 (L) 03/29/2020 1326   HGB 13.4 03/27/2014 1214   HCT 27.4 (L) 03/29/2020 1326   HCT 41.8 03/27/2014 1214   PLT 157 03/29/2020 1326   PLT 220 03/27/2014 1214   MCV 94.5 03/29/2020 1326   MCV 92 03/27/2014 1214   NEUTROABS 3.5 03/29/2020 1326   NEUTROABS 3,274 02/15/2019 0000   NEUTROABS 4.9 03/27/2014 1214   LYMPHSABS 0.8 03/29/2020 1326   LYMPHSABS 1.4 03/27/2014 1214   MONOABS 0.4 03/29/2020 1326   MONOABS 0.6 03/27/2014 1214   EOSABS 0.1 03/29/2020 1326   EOSABS 0.1 03/27/2014 1214   BASOSABS 0.0 03/29/2020 1326   BASOSABS 0.1 03/27/2014 1214   Comprehensive Metabolic Panel:    Component Value Date/Time   NA 138 03/29/2020 1326   NA 140 02/15/2019 0000   NA 142 03/27/2014 1214   K 3.8 03/29/2020 1326   K 4.6 03/27/2014 1214   CL 102 03/29/2020 1326   CL 107 03/27/2014 1214   CO2 27 03/29/2020 1326   CO2 33 (H) 03/27/2014 1214   BUN 16 03/29/2020 1326   BUN 19 02/15/2019 0000   BUN 14 03/27/2014 1214   CREATININE 1.37 (H) 03/29/2020 1326   CREATININE 1.04 03/27/2014 1214   GLUCOSE 140 (H) 03/29/2020 1326   GLUCOSE 77 03/27/2014 1214   CALCIUM 8.5 (L) 03/29/2020 1326   CALCIUM 8.7 03/27/2014 1214   AST 20 03/29/2020 1326   AST 17 03/27/2014 1214   ALT 12 03/29/2020 1326   ALT 22 03/27/2014 1214   ALKPHOS 72 03/29/2020 1326   ALKPHOS 81 03/27/2014 1214   BILITOT 1.4 (H) 03/29/2020 1326   BILITOT 1.1 (H) 03/27/2014 1214   PROT 5.9 (L) 03/29/2020 1326   PROT 6.9 03/27/2014 1214   ALBUMIN 2.9 (L) 03/29/2020 1326   ALBUMIN 3.6 03/27/2014 1214    RADIOGRAPHIC STUDIES: No results found.  PERFORMANCE STATUS (ECOG) : 3 - Symptomatic, >50% confined to bed  Review of Systems Unless otherwise noted, a complete review of  systems is negative.  Physical Exam General:  NAD Pulmonary: Unlabored Skin: no rashes Neurological: Weakness but otherwise nonfocal  IMPRESSION: I met with patient via [facility] visit.  She reports doing reasonably well at home.  She feels like she is stronger and better able to care for herself at home.  She is ambulating and completing ADLs independently.  No issues with medications reported nor need for refills.  She denies any significant changes or concerns.  She denies symptomatic complaints at present.  She is having no pain or other distressing symptoms.  She saw Dr. Janese Banks last week and has decided to try Keytruda.  PLAN: -Continue current scope of treatment -Follow-up MyChart visit about a month   Patient expressed understanding and was in agreement with this plan. She also understands that She can call the clinic at any time with any questions, concerns, or complaints.     Time Total: 15 minutes  Visit consisted of counseling and education dealing with the complex and emotionally intense issues of symptom management and palliative care in the setting of serious and potentially life-threatening illness.Greater than 50%  of this time was spent counseling and coordinating care related to the above assessment and plan.  Signed by: Altha Harm, PhD, NP-C

## 2020-04-02 ENCOUNTER — Ambulatory Visit (INDEPENDENT_AMBULATORY_CARE_PROVIDER_SITE_OTHER): Payer: PPO | Admitting: Internal Medicine

## 2020-04-02 ENCOUNTER — Encounter: Payer: Self-pay | Admitting: Internal Medicine

## 2020-04-02 ENCOUNTER — Other Ambulatory Visit: Payer: Self-pay

## 2020-04-02 VITALS — BP 96/56 | HR 90 | Temp 97.8°F | Resp 15 | Ht 62.0 in | Wt 151.4 lb

## 2020-04-02 DIAGNOSIS — T451X5A Adverse effect of antineoplastic and immunosuppressive drugs, initial encounter: Secondary | ICD-10-CM | POA: Diagnosis not present

## 2020-04-02 DIAGNOSIS — E039 Hypothyroidism, unspecified: Secondary | ICD-10-CM | POA: Diagnosis not present

## 2020-04-02 DIAGNOSIS — E785 Hyperlipidemia, unspecified: Secondary | ICD-10-CM | POA: Diagnosis not present

## 2020-04-02 DIAGNOSIS — D649 Anemia, unspecified: Secondary | ICD-10-CM | POA: Diagnosis not present

## 2020-04-02 DIAGNOSIS — Z23 Encounter for immunization: Secondary | ICD-10-CM

## 2020-04-02 DIAGNOSIS — C8590 Non-Hodgkin lymphoma, unspecified, unspecified site: Secondary | ICD-10-CM

## 2020-04-02 DIAGNOSIS — N182 Chronic kidney disease, stage 2 (mild): Secondary | ICD-10-CM

## 2020-04-02 DIAGNOSIS — G620 Drug-induced polyneuropathy: Secondary | ICD-10-CM

## 2020-04-02 DIAGNOSIS — G63 Polyneuropathy in diseases classified elsewhere: Secondary | ICD-10-CM | POA: Diagnosis not present

## 2020-04-02 DIAGNOSIS — N1831 Chronic kidney disease, stage 3a: Secondary | ICD-10-CM

## 2020-04-02 MED ORDER — AMITRIPTYLINE HCL 25 MG PO TABS: 25.0000 mg | ORAL_TABLET | Freq: Every day | ORAL | 3 refills | Status: AC

## 2020-04-02 NOTE — Progress Notes (Signed)
Subjective:  Patient ID: Jaime Hall, female    DOB: 1950-08-11  Age: 69 y.o. MRN: [id number]  CC: The primary encounter diagnosis was Acquired hypothyroidism. Diagnoses of Need for 23-polyvalent pneumococcal polysaccharide vaccine, Chronic kidney disease, stage 2, mildly decreased GFR, Stage 3a chronic kidney disease (Jaime Hall), Hyperlipidemia, unspecified hyperlipidemia type, Neuropathy associated with lymphoma (Jaime Hall), Chemotherapy-induced peripheral neuropathy (Jaime Hall), and Normocytic anemia were also pertinent to this visit.  HPI Jaime Hall presents for follow up on hyperlipidemia,  Aortic atherosclerosis and other issues .  Last seen one month ago following recurrent hospitalizations for PE , hemoptysis,  pneumonia and CHF  This visit occurred during the SARS-CoV-2 public health emergency.  Safety protocols were in place, including screening questions prior to the visit, additional usage of staff PPE, and extensive cleaning of exam room while observing appropriate contact time as indicated for disinfecting solutions.  Depression screen:  Reviewed answers.  Many of her positivie symptoms are chronic or related to her cancer. She has chronic insomnia.  Trouble falling asleep . Latency over 2 hours .   sometimes doesn't sleep at all  . Prior [place] trial did not help.  Appetite is poor due to altered sense of taste.  Fatigue is aggravated by anemia   Taking cymbalta for neuropathy but the cost is prohibitive.  No prior trial of elavil. Did  not tolerate gabapentin   Metastatic endometrial CA :  Recurrence of CA May 221.  Has decided to start [place] with Dr Janese Banks as of yesterday's meeting .  Feeling better and stronger since last visit   Anemia; hgb has dropped again .  Stools have not been black. No hemoptysis .  Taking a lower dose of Eliquis , started two weeks ago.   History of hypoxic  failure ; not oxygen dependent it  during the day but using it at night because she is short of breath  with supine position and wakes up with a headache in the morning if she doesn't use it  02 sats ranging from 93 to 98 during the day  CHF:  Weighing daily and taking lasix 20 mg daily    Outpatient Medications Prior to Visit  Medication Sig Dispense Refill  . apixaban (ELIQUIS) 2.5 MG TABS tablet Take 1 tablet (2.5 mg total) by mouth 2 (two) times daily. 60 tablet 1  . atorvastatin (LIPITOR) 10 MG tablet Take 1 tablet by mouth once daily (Patient taking differently: Take 10 mg by mouth every evening. ) 90 tablet 2  . carvedilol (COREG) 3.125 MG tablet Take 1 tablet (3.125 mg total) by mouth 2 (two) times daily with a meal. 60 tablet 2  . cholecalciferol (VITAMIN D3) 25 MCG (1000 UNIT) tablet Take 1,000 Units by mouth in the morning and at bedtime.    . cyanocobalamin (,VITAMIN B-12,) 1000 MCG/ML injection INJECT 1ML IM WEEKLY FOR 4 WEEKS, THEN INJECT MONTHLY THEREAFTER (Patient taking differently: Inject 1,000 mcg into the skin every 30 (thirty) days. ) 10 mL 0  . furosemide (LASIX) 20 MG tablet Take 1 tablet (20 mg total) by mouth as needed for fluid or edema (Take one tablet daily as needed for lower extremity edema or for weight gain of 2 lbs overnight or 5 lbs in one week.). (Patient taking differently: Take 20 mg by mouth daily. ) 30 tablet 2  . levothyroxine (SYNTHROID) 75 MCG tablet Take 1 tablet (75 mcg total) by mouth daily before breakfast. 30 tablet 0  . LORazepam (ATIVAN) 0.5  MG tablet Take 1 tablet (0.5 mg total) by mouth every 6 (six) hours as needed for anxiety (and to help with breathing). 120 tablet 0  . losartan (COZAAR) 25 MG tablet Take 0.5 tablets (12.5 mg total) by mouth daily. 15 tablet 2  . pantoprazole (PROTONIX) 20 MG tablet Take 1 tablet (20 mg total) by mouth daily. 60 tablet 1  . potassium chloride (KLOR-CON) 10 MEQ tablet Take 1 tablet (10 mEq total) by mouth daily. 90 tablet 1  . DULoxetine (CYMBALTA) 30 MG capsule Take 1 capsule (30 mg total) by mouth daily. 90  capsule 2  . Morphine Sulfate (MORPHINE CONCENTRATE) 10 mg / 0.5 ml concentrated solution Take 0.25 mLs (5 mg total) by mouth every 6 (six) hours as needed for severe pain or shortness of breath. (Patient not taking: Reported on 04/02/2020) 30 mL 0   Facility-Administered Medications Prior to Visit  Medication Dose Route Frequency Provider Last Rate Last Admin  . heparin lock flush 100 unit/mL  500 Units Intravenous Once Sindy Guadeloupe, MD      . sodium chloride flush (NS) 0.9 % injection 10 mL  10 mL Intravenous PRN Sindy Guadeloupe, MD   10 mL at 12/07/19 0914  . sodium chloride flush (NS) 0.9 % injection 10 mL  10 mL Intravenous PRN Sindy Guadeloupe, MD   10 mL at 01/04/20 0850    Review of Systems;  Patient denies headache, fevers, malaise, unintentional weight loss, skin rash, eye pain, sinus congestion and sinus pain, sore throat, dysphagia,  hemoptysis , cough, dyspnea, wheezing, chest pain, palpitations, orthopnea, edema, abdominal pain, nausea, melena, diarrhea, constipation, flank pain, dysuria, hematuria, urinary  Frequency, nocturia, numbness, tingling, seizures,  Focal weakness, Loss of consciousness,  Tremor, insomnia, depression, anxiety, and suicidal ideation.      Objective:  BP (!) 96/56 (BP Location: Left Arm, Patient Position: Sitting, Cuff Size: Normal)   Pulse 90   Temp 97.8 F (36.6 C) (Oral)   Resp 15   Ht 5\' 2"  (1.575 m)   Wt 151 lb 6.4 oz (68.7 kg)   SpO2 95%   BMI 27.69 kg/m   BP Readings from Last 3 Encounters:  04/02/20 (!) 96/56  03/29/20 (!) 94/54  03/12/20 (!) 94/50    Wt Readings from Last 3 Encounters:  04/02/20 151 lb 6.4 oz (68.7 kg)  03/29/20 151 lb (68.5 kg)  03/12/20 148 lb 11.2 oz (67.4 kg)    General appearance: alert, cooperative and appears stated age Ears: normal TM's and external ear canals both ears Throat: lips, mucosa, and tongue normal; teeth and gums normal Neck: no adenopathy, no carotid bruit, supple, symmetrical, trachea  midline and thyroid not enlarged, symmetric, no tenderness/mass/nodules Back: symmetric, no curvature. ROM normal. No CVA tenderness. Lungs: clear to auscultation bilaterally Heart: regular rate and rhythm, S1, S2 normal, no murmur, click, rub or gallop Abdomen: soft, non-tender; bowel sounds normal; no masses,  no organomegaly Pulses: 2+ and symmetric Skin: Skin color, texture, turgor normal. No rashes or lesions Lymph nodes: Cervical, supraclavicular, and axillary nodes normal.  Lab Results  Component Value Date   HGBA1C 6.1 (H) 12/30/2019   HGBA1C 6.0 07/27/2019   HGBA1C 14.1 08/08/2018    Lab Results  Component Value Date   CREATININE 1.37 (H) 03/29/2020   CREATININE 1.77 (H) 03/12/2020   CREATININE 1.27 (H) 02/27/2020    Lab Results  Component Value Date   WBC 4.8 03/29/2020   HGB 8.7 (L) 03/29/2020  HCT 27.4 (L) 03/29/2020   PLT 157 03/29/2020   GLUCOSE 140 (H) 03/29/2020   CHOL 114 12/30/2019   TRIG 131 12/30/2019   HDL 47 12/30/2019   LDLCALC 41 12/30/2019   ALT 12 03/29/2020   AST 20 03/29/2020   NA 138 03/29/2020   K 3.8 03/29/2020   CL 102 03/29/2020   CREATININE 1.37 (H) 03/29/2020   BUN 16 03/29/2020   CO2 27 03/29/2020   TSH 11.200 (H) 02/20/2020   INR 1.4 (H) 02/22/2020   HGBA1C 6.1 (H) 12/30/2019   MICROALBUR 2.6 (H) 10/04/2017    US Venous Img Lower Bilateral (DVT)  Result Date: 02/20/2020 CLINICAL DATA:  PE EXAM: BILATERAL LOWER EXTREMITY VENOUS DOPPLER ULTRASOUND TECHNIQUE: Gray-scale sonography with compression, as well as color and duplex ultrasound, were performed to evaluate the deep venous system(s) from the level of the common femoral vein through the popliteal and proximal calf veins. COMPARISON:  None. FINDINGS: VENOUS Normal compressibility of the common femoral, superficial femoral, and popliteal veins, as well as the visualized calf veins (peroneal vein not well visualized on the left). Visualized portions of profunda femoral vein and  great saphenous vein unremarkable. No filling defects to suggest DVT on grayscale or color Doppler imaging. Doppler waveforms show normal direction of venous flow, normal respiratory plasticity and response to augmentation. OTHER None. Limitations: none IMPRESSION: No evidence of DVT in the lower extremities. Electronically Signed   By: Macy Mis M.D.   On: 02/20/2020 12:08   DG Chest Portable 1 View  Result Date: 02/19/2020 CLINICAL DATA:  Pt arrives via [facility] from home with complaint of SOB. Pt low 70's% on RA, pt with blue lips. EMS administered 1 duoneb and placed on 8L NRB, spo2 100%. Pt tachy in the low 100's for HR EXAM: PORTABLE CHEST 1 VIEW COMPARISON:  02/13/2020 FINDINGS: Hazy opacities are noted in both lungs. Scarring is noted extending from the hila into the medial upper lobes. No convincing pleural effusion and no pneumothorax. Right internal jugular central venous catheter is stable, tip in the lower superior vena cava. Cardiac silhouette is mildly enlarged. No mediastinal or hilar masses. No evidence of adenopathy. Skeletal structures are grossly intact. IMPRESSION: 1. Hazy opacity is noted in lungs, representing a change from the most recent prior chest radiograph. Findings may be due to multifocal pneumonia or pulmonary edema. Electronically Signed   By: Lajean Manes M.D.   On: 02/19/2020 21:41    Assessment & Plan:   Problem List Items Addressed This Visit      Unprioritized   Chemotherapy-induced peripheral neuropathy (Jaime Hall)    She is stopping cymbalta due to cost.  Did not tolerate gabapentin..  Trial of elavil given concurrent insomnia       Relevant Medications   amitriptyline (ELAVIL) 25 MG tablet   RESOLVED: Chronic kidney disease, stage 2, mildly decreased GFR    GFR has improved since discharge       CKD (chronic kidney disease), stage IIIa    GFR has improved since discharge       HLD (hyperlipidemia)    Managed with  High potency statin ,  Low dose.   LDL  and triglycerides are at goal on current medications. She  has no side effects and liver enzymes are normal. No changes today  Lab Results  Component Value Date   CHOL 114 12/30/2019   HDL 47 12/30/2019   LDLCALC 41 12/30/2019   TRIG 131 12/30/2019   CHOLHDL 2.4 12/30/2019  Lab Results  Component Value Date   ALT 12 03/29/2020   AST 20 03/29/2020   ALKPHOS 72 03/29/2020   BILITOT 1.4 (H) 03/29/2020               Hypothyroidism - Primary   Relevant Orders   TSH   RESOLVED: Neuropathy associated with lymphoma ([place])    She is planning to stop cymbalta due to cost . Trial of Elavil given concurrent insomnia       Normocytic anemia    Presumed Secondary to bone marrow suppression .  Oncology managing        Other Visit Diagnoses    Need for 23-polyvalent pneumococcal polysaccharide vaccine       Relevant Orders   Pneumococcal polysaccharide vaccine 23-valent greater than or equal to 2yo subcutaneous/IM (Completed)    A total of 40 minutes was spent with patient more than half of which was spent in counseling patient on the above mentioned issues , reviewing and explaining recent labs and imaging studies done, and coordination of care.   I have discontinued Jaime Hall. Fan's DULoxetine. I am also having her start on amitriptyline. Additionally, I am having her maintain her cyanocobalamin, atorvastatin, cholecalciferol, furosemide, LORazepam, carvedilol, losartan, levothyroxine, morphine CONCENTRATE, potassium chloride, apixaban, and pantoprazole.  Meds ordered this encounter  Medications  . amitriptyline (ELAVIL) 25 MG tablet    Sig: Take 1 tablet (25 mg total) by mouth at bedtime. May increase weekly as needed    Dispense:  90 tablet    Refill:  3    Medications Discontinued During This Encounter  Medication Reason  . DULoxetine (CYMBALTA) 30 MG capsule     Follow-up: No follow-ups on file.   [name], MD

## 2020-04-02 NOTE — Patient Instructions (Addendum)
  I recommend trying melatonin for your insomnia.  It is not a sedative,  But must be taken on  a regular basis to help your internal clock.  Take every evening after dinner start with 3 mg dose   Max effective dose is 10 mg  You can also try using " Headspace".  This  is a phone app that teaches you to meditate and empty your mind of thoughts that interfere with space     Reduce cymbalta to every other day for 2 weeks , then stop completely   For neuropathy and insomnia:  Start generic Elavil 1 hour before bedtime.  Start with a 25 mg dose,  Increase each week by 25 mg if needed,  To  A maximum dose of 100 mg.  It will make you sleepy   If your mood worsens once you stop the Cymbalta, let me know and we will start ZOloft

## 2020-04-02 NOTE — Assessment & Plan Note (Signed)
Managed with  High potency statin ,  Low dose.   LDL and triglycerides are at goal on current medications. She  has no side effects and liver enzymes are normal. No changes today  Lab Results  Component Value Date   CHOL 114 12/30/2019   HDL 47 12/30/2019   LDLCALC 41 12/30/2019   TRIG 131 12/30/2019   CHOLHDL 2.4 12/30/2019   Lab Results  Component Value Date   ALT 12 03/29/2020   AST 20 03/29/2020   ALKPHOS 72 03/29/2020   BILITOT 1.4 (H) 03/29/2020

## 2020-04-02 NOTE — Assessment & Plan Note (Signed)
GFR has improved since discharge

## 2020-04-02 NOTE — Assessment & Plan Note (Signed)
Presumed Secondary to bone marrow suppression .  Oncology managing

## 2020-04-02 NOTE — Assessment & Plan Note (Signed)
She is stopping cymbalta due to cost.  Did not tolerate gabapentin..  Trial of elavil given concurrent insomnia

## 2020-04-02 NOTE — Assessment & Plan Note (Signed)
She is planning to stop cymbalta due to cost . Trial of Elavil given concurrent insomnia

## 2020-04-03 ENCOUNTER — Encounter: Payer: Self-pay | Admitting: Oncology

## 2020-04-03 ENCOUNTER — Ambulatory Visit: Payer: PPO

## 2020-04-03 ENCOUNTER — Encounter: Payer: Self-pay | Admitting: Family

## 2020-04-03 DIAGNOSIS — I5022 Chronic systolic (congestive) heart failure: Secondary | ICD-10-CM

## 2020-04-03 NOTE — Progress Notes (Signed)
Hematology/Oncology Consult note [facility]  [phone] [phone]  Patient Care Team: [name], MD as PCP - General (Internal Medicine) Wellington Hampshire, MD as PCP - Cardiology (Cardiology) [name], MD as Consulting Physician (General Surgery) Mellody Drown, MD as Referring Physician (Obstetrics and Gynecology) [name], MD as Referring Physician (Obstetrics and Gynecology) Hollice Espy, MD as Consulting Physician (Urology) [name], RN as Registered Nurse Sindy Guadeloupe, MD as Consulting Physician (Oncology)   Name of the patient: Jaime Hall  [id number]  05-01-1951   Date of visit: 04/03/20  Diagnosis- 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 She underwent robotic hysterectomy with bilateral salpingo-oophorectomy with washings and sentinel lymph node injection mapping and biopsy in February 2018. She had multiple postoperative complications and completed 3 cycles of carbotaxol chemotherapy in May 2018. Treatment was complicated by chemo-induced peripheral neuropathy for which Taxol dose was reduced by 25%. She completed 5 weeks of external beam whole pelvic radiation on August 2018.Her original tumor in 2018 was ER +1%. PR negative. MSI stable and HER-2 negative  She was under clinical surveillance and recently presented to [name] with symptoms of right lower quadrant abdominal pain which led to aCT abdomen which showed new peritoneal metastatic disease throughout the right abdomen and pelvis with index masses measuring 5.7 x 4.7 cm. Head CT scan also showed evidence of  hypermetabolic abdominal pelvic peritoneal metastases but no evidence of other sites of metastases.  Repeat omental biopsy confirms high grade serous carcinoma.Foundation 1 testing showed following alteration/biomarkers. Microsatellite status stable. EPH for be amplification, ESR 1 amplification, HNF1 A G 292 FS 25, follow-up 1 amplification, with 3 ER 1 loss, PPP 2R1 AAS 256F, PTEN loss, T p53. However none of these mutations have actionable targets.  Repeat echocardiogram showed EF of 30 to 35%. Cardiac cath unremarkable consistent with nonischemic cardiomyopathy.Therefore Doxil not chosen and she would be getting carboplatin and gemcitabine. Chemotherapy complicated by significant anemia causing supply demand cardiac ischemia and hospitalization.Patient switched to [facility] and lenvatinib starting 01/11/2020   Interval history-patient reports feeling better today.  She has not had much nausea vomiting over the last 1 week.  She is also weaned herself off oxygen.  She continues to live alone and managing her ADLs.  ECOG PS- 2 Pain scale-0 Opioid associated constipation- no  Review of systems- Review of Systems  Constitutional: Positive for malaise/fatigue. Negative for chills, fever and weight loss.  HENT: Negative for congestion, ear discharge and nosebleeds.   Eyes: Negative for blurred vision.  Respiratory: Negative for cough, hemoptysis, sputum production, shortness of breath and wheezing.   Cardiovascular: Negative for chest pain, palpitations, orthopnea and claudication.  Gastrointestinal: Negative for abdominal pain, blood in stool, constipation, diarrhea, heartburn, melena, nausea and vomiting.  Genitourinary: Negative for dysuria, flank pain, frequency, hematuria and urgency.  Musculoskeletal: Negative for back pain, joint pain and myalgias.  Skin: Negative for rash.  Neurological: Negative for dizziness, tingling, focal weakness, seizures, weakness and headaches.    Endo/Heme/Allergies: Does not bruise/bleed easily.  Psychiatric/Behavioral: Negative for depression and suicidal ideas. The patient does not have insomnia.       Allergies  Allergen Reactions  . Adhesive [Tape] Other (See  Comments)    Skin Irritation   . Antifungal [Miconazole Nitrate] Rash  . Sulfa Antibiotics Nausea And Vomiting and Rash  . Z-Pak [Azithromycin] Itching     Past Medical History:  Diagnosis Date  . Anxiety   . Cervicalgia   . CHF (congestive heart failure) ([place])   . Chronic kidney disease   . Coronary artery disease    a. 02/2012 Stress echo: severe anterior wall ischemia;  b. 02/2012 Cath/PCI: LAD 95p (3.0 x 15 Xience EX DES), D1 90ost (PTCA - bifurcational dzs), EF 45% with anterior HK;  b. 02/2013 Ex MV: fixed anterior defect w/ minor reversibility, nl EF-->Med Rx.  . Endometrial cancer ([place])    a. 07/2016 s/p robotic hysterectomy, BSO w/ washings, sentinel node inj, mapping, bx, adhesiolysis.  . Essential hypertension, benign   . Fibrocystic breast disease   . GERD (gastroesophageal reflux disease)   . Gestational hypertension   . Heart murmur   . History of anemia   . History of blood transfusion   . Hodgkin's lymphoma ([place]) 2011   a. s/p radiation and chemo therapy  . Osteoarthritis   . Polycystic ovarian disease      Past Surgical History:  Procedure Laterality Date  . ABDOMINAL HYSTERECTOMY    . bladder sling    . CARDIAC CATHETERIZATION  02/2012   [facility] 1 stent place  . CERVICAL POLYPECTOMY    . CHOLECYSTECTOMY  1982  . COLONOSCOPY WITH PROPOFOL N/A 02/05/2015   Procedure: COLONOSCOPY WITH PROPOFOL;  Surgeon: Lucilla Lame, MD;  Location: [facility] ENDOSCOPY;  Service: Endoscopy;  Laterality: N/A;  . CORONARY ANGIOPLASTY  02/2012   left/right s/p balloon  . CYSTOGRAM N/A 08/17/2016   Procedure: CYSTOGRAM;  Surgeon: Hollice Espy, MD;  Location: [facility] ORS;  Service: Urology;  Laterality: N/A;  . CYSTOSCOPY N/A 08/17/2016   Procedure: CYSTOSCOPY EXAM  UNDER ANESTHESIA;  Surgeon: Hollice Espy, MD;  Location: [facility] ORS;  Service: Urology;  Laterality: N/A;  . CYSTOSCOPY W/ RETROGRADES Bilateral 08/17/2016   Procedure: CYSTOSCOPY WITH RETROGRADE PYELOGRAM;  Surgeon: Hollice Espy, MD;  Location: [facility] ORS;  Service: Urology;  Laterality: Bilateral;  . CYSTOSCOPY WITH STENT PLACEMENT Right 08/17/2016   Procedure: CYSTOSCOPY WITH STENT PLACEMENT;  Surgeon: Hollice Espy, MD;  Location: [facility] ORS;  Service: Urology;  Laterality: Right;  . heart stent'  2013  . kidney stent Right 2018  . LYMPH NODE BIOPSY  2011   diagnosis of hodgkins lymphoma  . PELVIC LYMPH NODE DISSECTION N/A 07/29/2016   Procedure: PELVIC/AORTIC LYMPH NODE SAMPLING;  Surgeon: [name], MD;  Location: [facility] ORS;  Service: Gynecology;  Laterality: N/A;  . PORTA CATH INSERTION N/A 09/22/2016   Procedure: Glori Luis Cath Insertion;  Surgeon: [name], MD;  Location: [place] [facility];  Service: Cardiovascular;  Laterality: N/A;  . PORTA CATH INSERTION N/A 10/27/2019   Procedure: PORTA CATH INSERTION;  Surgeon: [name], MD;  Location: [place] [facility];  Service: Cardiovascular;  Laterality: N/A;  . PORTA CATH REMOVAL N/A 11/17/2016   Procedure: Glori Luis Cath Removal;  Surgeon: [name], MD;  Location: [place] [facility];  Service: Cardiovascular;  Laterality: N/A;  . RIGHT/LEFT HEART CATH AND CORONARY ANGIOGRAPHY N/A 12/04/2019   Procedure: RIGHT/LEFT HEART CATH AND CORONARY ANGIOGRAPHY;  Surgeon: Wellington Hampshire, MD;  Location: [place] [facility];  Service: Cardiovascular;  Laterality: N/A;  . ROBOTIC ASSISTED TOTAL HYSTERECTOMY WITH BILATERAL SALPINGO OOPHERECTOMY N/A 07/29/2016   Procedure: ROBOTIC ASSISTED TOTAL  HYSTERECTOMY WITH BILATERAL SALPINGO OOPHORECTOMY;  Surgeon: [name], MD;  Location: [facility] ORS;  Service: Gynecology;  Laterality: N/A;  . SENTINEL NODE BIOPSY N/A 07/29/2016   Procedure: SENTINEL NODE BIOPSY;  Surgeon:  [name], MD;  Location: [facility] ORS;  Service: Gynecology;  Laterality: N/A;  . transobturator sling N/A 2009   [place]    Social History   Socioeconomic History  . Marital status: Widowed    Spouse name: Not on file  . Number of children: Not on file  . Years of education: Not on file  . Highest education level: Not on file  Occupational History  . Not on file  Tobacco Use  . Smoking status: Former Smoker    Packs/day: 1.00    Years: 30.00    Pack years: 30.00    Types: Cigarettes    Quit date: 07/24/2002    Years since quitting: 17.7  . Smokeless tobacco: Never Used  . Tobacco comment: quit smoking in 2000  Vaping Use  . Vaping Use: Never used  Substance and Sexual Activity  . Alcohol use: Not Currently  . Drug use: No  . Sexual activity: Never  Other Topics Concern  . 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    Social Determinants of Health   Financial Resource Strain:   . Difficulty of Paying Living Expenses: Not on file  Food Insecurity:   . Worried About Charity fundraiser in the Last Year: Not on file  . Ran Out of Food in the Last Year: Not on file  Transportation Needs:   . Lack of Transportation (Medical): Not on file  . Lack of Transportation (Non-Medical): Not on file  Physical Activity:   . Days of Exercise per Week: Not on file  . Minutes of Exercise per Session: Not on file  Stress:   . Feeling of Stress : Not on file  Social Connections:   . Frequency of Communication with Friends and Family: Not on file  . Frequency of Social Gatherings with Friends and Family: Not on file  . Attends Religious Services: Not on file  . Active Member of Clubs or Organizations: Not on file  . Attends Archivist Meetings: Not on file  . Marital Status: Not on file  Intimate Partner Violence:   . Fear of Current or Ex-Partner: Not on file  . Emotionally  Abused: Not on file  . Physically Abused: Not on file  . Sexually Abused: Not on file    Family History  Problem Relation Age of Onset  . ALS Father   . Polymyositis Father   . Diabetes Brother   . Cancer Maternal Aunt        breast  . Breast cancer Maternal Aunt        30's  . Stroke Maternal Grandmother   . Cancer Maternal Grandfather        prostate  . Stroke Maternal Grandfather   . Colon cancer Maternal Aunt   . Non-Hodgkin's lymphoma Cousin      Current Outpatient Medications:  .  apixaban (ELIQUIS) 2.5 MG TABS tablet, Take 1 tablet (2.5 mg total) by mouth 2 (two) times daily., Disp: 60 tablet, Rfl: 1 .  atorvastatin (LIPITOR) 10 MG tablet, Take 1 tablet by mouth once daily (Patient taking differently: Take 10 mg by mouth every evening. ), Disp: 90 tablet,  Rfl: 2 .  carvedilol (COREG) 3.125 MG tablet, Take 1 tablet (3.125 mg total) by mouth 2 (two) times daily with a meal., Disp: 60 tablet, Rfl: 2 .  cholecalciferol (VITAMIN D3) 25 MCG (1000 UNIT) tablet, Take 1,000 Units by mouth in the morning and at bedtime., Disp: , Rfl:  .  cyanocobalamin (,VITAMIN B-12,) 1000 MCG/ML injection, INJECT 1ML IM WEEKLY FOR 4 WEEKS, THEN INJECT MONTHLY THEREAFTER (Patient taking differently: Inject 1,000 mcg into the skin every 30 (thirty) days. ), Disp: 10 mL, Rfl: 0 .  furosemide (LASIX) 20 MG tablet, Take 1 tablet (20 mg total) by mouth as needed for fluid or edema (Take one tablet daily as needed for lower extremity edema or for weight gain of 2 lbs overnight or 5 lbs in one week.). (Patient taking differently: Take 20 mg by mouth daily. ), Disp: 30 tablet, Rfl: 2 .  levothyroxine (SYNTHROID) 75 MCG tablet, Take 1 tablet (75 mcg total) by mouth daily before breakfast., Disp: 30 tablet, Rfl: 0 .  LORazepam (ATIVAN) 0.5 MG tablet, Take 1 tablet (0.5 mg total) by mouth every 6 (six) hours as needed for anxiety (and to help with breathing)., Disp: 120 tablet, Rfl: 0 .  losartan (COZAAR) 25 MG  tablet, Take 0.5 tablets (12.5 mg total) by mouth daily., Disp: 15 tablet, Rfl: 2 .  Morphine Sulfate (MORPHINE CONCENTRATE) 10 mg / 0.5 ml concentrated solution, Take 0.25 mLs (5 mg total) by mouth every 6 (six) hours as needed for severe pain or shortness of breath. (Patient not taking: Reported on 04/02/2020), Disp: 30 mL, Rfl: 0 .  pantoprazole (PROTONIX) 20 MG tablet, Take 1 tablet (20 mg total) by mouth daily., Disp: 60 tablet, Rfl: 1 .  potassium chloride (KLOR-CON) 10 MEQ tablet, Take 1 tablet (10 mEq total) by mouth daily., Disp: 90 tablet, Rfl: 1 .  amitriptyline (ELAVIL) 25 MG tablet, Take 1 tablet (25 mg total) by mouth at bedtime. May increase weekly as needed, Disp: 90 tablet, Rfl: 3 No current facility-administered medications for this visit.  Facility-Administered Medications Ordered in Other Visits:  .  heparin lock flush 100 unit/mL, 500 Units, Intravenous, Once, Randa Evens C, MD .  sodium chloride flush (NS) 0.9 % injection 10 mL, 10 mL, Intravenous, PRN, Sindy Guadeloupe, MD, 10 mL at 12/07/19 0914 .  sodium chloride flush (NS) 0.9 % injection 10 mL, 10 mL, Intravenous, PRN, Sindy Guadeloupe, MD, 10 mL at 01/04/20 0850  Physical exam:  Vitals:   03/29/20 1358  BP: (!) 94/54  Pulse: 92  Resp: 16  Temp: (!) 96.5 F (35.8 C)  TempSrc: Tympanic  SpO2: 98%  Weight: 151 lb (68.5 kg)   Physical Exam Constitutional:      General: She is not in acute distress. Eyes:     Pupils: Pupils are equal, round, and reactive to light.  Cardiovascular:     Rate and Rhythm: Normal rate and regular rhythm.     Heart sounds: Normal heart sounds.  Pulmonary:     Effort: Pulmonary effort is normal.     Breath sounds: Normal breath sounds.  Abdominal:     General: Bowel sounds are normal.     Palpations: Abdomen is soft.  Skin:    General: Skin is warm and dry.  Neurological:     Mental Status: She is alert and oriented to person, place, and time.      CMP Latest Ref Rng & Units  03/29/2020  Glucose 70 -  99 mg/dL 140(H)  BUN 8 - 23 mg/dL 16  Creatinine 0.44 - 1.00 mg/dL 1.37(H)  Sodium 135 - 145 mmol/L 138  Potassium 3.5 - 5.1 mmol/L 3.8  Chloride 98 - 111 mmol/L 102  CO2 22 - 32 mmol/L 27  Calcium 8.9 - 10.3 mg/dL 8.5(L)  Total Protein 6.5 - 8.1 g/dL 5.9(L)  Total Bilirubin 0.3 - 1.2 mg/dL 1.4(H)  Alkaline Phos 38 - 126 U/L 72  AST 15 - 41 U/L 20  ALT 0 - 44 U/L 12   CBC Latest Ref Rng & Units 03/29/2020  WBC 4.0 - 10.5 K/uL 4.8  Hemoglobin 12.0 - 15.0 g/dL 8.7(L)  Hematocrit 36 - 46 % 27.4(L)  Platelets 150 - 400 K/uL 157    Assessment and plan- Patient is a 69 y.o. female recurrent high-grade serous endometrial carcinoma on second line lenvatinib and Keytruda chemotherapy complicated by repeated episodes of acute on chronic congestive heart failure as well as recent segmental PE.  She is here for routine follow-up  Patient overall feels better over the last 3 weeks.  She has not received any treatment now for over 2 months.  She was admitted to the hospital for recurrent episodes of shortness of breath for possible heart failure.  Currently she has weaned herself off oxygen.  She is not mentally ready to proceed with hospice at this time.  We discussed starting back on single agent Keytruda at this time and if she tolerates it well we could consider adding lenvatinib at lower dose.  She will continue to hold lenvatinib at this time.  She continues to be anemic with a hemoglobin of 8.7 but her white count and platelets are normal.  I suspect anemia secondary to underlying chronic disease.  CA-125 continues to trend up and was 41 last month as she has not received any treatment so far.  Patient understands that if she has a setback after receiving Keytruda we will not be giving her any further treatment at this time.  I will see her next week to start [place] with CBC with differential CMP and TSH   Visit Diagnosis 1. Endometrial cancer ([place])   2. Encounter for  antineoplastic immunotherapy   3. Goals of care, counseling/discussion      Dr. Randa Evens, MD, MPH [facility] [phone] 04/03/2020 12:54 PM

## 2020-04-04 ENCOUNTER — Encounter: Payer: Self-pay | Admitting: Oncology

## 2020-04-04 ENCOUNTER — Other Ambulatory Visit: Payer: Self-pay | Admitting: *Deleted

## 2020-04-04 MED ORDER — LEVOTHYROXINE SODIUM 75 MCG PO TABS: 75.0000 ug | ORAL_TABLET | Freq: Every day | ORAL | 2 refills | Status: DC

## 2020-04-04 MED ORDER — CARVEDILOL 3.125 MG PO TABS: 3.1250 mg | ORAL_TABLET | Freq: Two times a day (BID) | ORAL | 1 refills | Status: DC

## 2020-04-05 ENCOUNTER — Inpatient Hospital Stay: Payer: PPO

## 2020-04-05 ENCOUNTER — Other Ambulatory Visit: Payer: Self-pay

## 2020-04-05 ENCOUNTER — Inpatient Hospital Stay (HOSPITAL_BASED_OUTPATIENT_CLINIC_OR_DEPARTMENT_OTHER): Payer: PPO | Admitting: Oncology

## 2020-04-05 VITALS — BP 103/50 | HR 79

## 2020-04-05 VITALS — BP 78/53 | HR 104 | Temp 97.3°F | Wt 150.3 lb

## 2020-04-05 DIAGNOSIS — Z5112 Encounter for antineoplastic immunotherapy: Secondary | ICD-10-CM | POA: Diagnosis not present

## 2020-04-05 DIAGNOSIS — I959 Hypotension, unspecified: Secondary | ICD-10-CM

## 2020-04-05 DIAGNOSIS — C541 Malignant neoplasm of endometrium: Secondary | ICD-10-CM

## 2020-04-05 DIAGNOSIS — E038 Other specified hypothyroidism: Secondary | ICD-10-CM | POA: Diagnosis not present

## 2020-04-05 DIAGNOSIS — C786 Secondary malignant neoplasm of retroperitoneum and peritoneum: Secondary | ICD-10-CM

## 2020-04-05 MED ORDER — LEVOTHYROXINE SODIUM 75 MCG PO TABS: 75.0000 ug | ORAL_TABLET | Freq: Every day | ORAL | 2 refills | Status: DC

## 2020-04-05 MED ORDER — SODIUM CHLORIDE 0.9 % IV SOLN
Freq: Once | INTRAVENOUS | Status: AC
  Filled 2020-04-05: qty 250

## 2020-04-05 MED ORDER — HEPARIN SOD (PORK) LOCK FLUSH 100 UNIT/ML IV SOLN
500.0000 [IU] | Freq: Once | INTRAVENOUS | Status: AC | PRN
  Administered 2020-04-05: 500 [IU]
  Filled 2020-04-05: qty 5

## 2020-04-05 MED ORDER — HEPARIN SOD (PORK) LOCK FLUSH 100 UNIT/ML IV SOLN
INTRAVENOUS | Status: AC
  Filled 2020-04-05: qty 5

## 2020-04-05 MED ORDER — SODIUM CHLORIDE 0.9 % IV SOLN
200.0000 mg | Freq: Once | INTRAVENOUS | Status: AC
  Administered 2020-04-05: 200 mg via INTRAVENOUS
  Filled 2020-04-05: qty 8

## 2020-04-05 MED ORDER — TRIAMCINOLONE ACETONIDE 0.1 % EX CREA: 1.0000 "application " | TOPICAL_CREAM | Freq: Two times a day (BID) | CUTANEOUS | 0 refills | Status: DC

## 2020-04-05 NOTE — Progress Notes (Signed)
MD made aware of pt.'s vitals. Per MD to continue with [name] today. Pt and treatment team updated and all questions answered at this time. Pt stable.   Jaime Hall [employer]

## 2020-04-07 ENCOUNTER — Encounter: Payer: Self-pay | Admitting: Oncology

## 2020-04-07 NOTE — Progress Notes (Signed)
Hematology/Oncology Consult note [facility]  [phone] [phone]  Patient Care Team: [name], MD as PCP - General (Internal Medicine) Wellington Hampshire, MD as PCP - Cardiology (Cardiology) [name], MD as Consulting Physician (General Surgery) Mellody Drown, MD as Referring Physician (Obstetrics and Gynecology) [name], MD as Referring Physician (Obstetrics and Gynecology) Hollice Espy, MD as Consulting Physician (Urology) [name], RN as Registered Nurse Sindy Guadeloupe, MD as Consulting Physician (Oncology)   Name of the patient: Jaime Hall  [id number]  1950/10/08   Date of visit: 04/07/20  Diagnosis-  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 She underwent robotic hysterectomy with bilateral salpingo-oophorectomy with washings and sentinel lymph node injection mapping and biopsy in February 2018. She had multiple postoperative complications and completed 3 cycles of carbotaxol chemotherapy in May 2018. Treatment was complicated by chemo-induced peripheral neuropathy for which Taxol dose was reduced by 25%. She completed 5 weeks of external beam whole pelvic radiation on August 2018.Her original tumor in 2018 was ER +1%. PR negative. MSI stable and HER-2 negative  She was under clinical surveillance and recently presented to [name] with symptoms of right lower quadrant abdominal pain which led to aCT abdomen which showed new peritoneal metastatic disease throughout the right abdomen and pelvis with index masses measuring 5.7 x 4.7 cm. Head CT scan also  showed evidence of hypermetabolic abdominal pelvic peritoneal metastases but no evidence of other sites of metastases.  Repeat omental biopsy confirms high grade serous carcinoma.Foundation 1 testing showed following alteration/biomarkers. Microsatellite status stable. EPH for be amplification, ESR 1 amplification, HNF1 A G 292 FS 25, follow-up 1 amplification, with 3 ER 1 loss, PPP 2R1 AAS 256F, PTEN loss, T p53. However none of these mutations have actionable targets.  Repeat echocardiogram showed EF of 30 to 35%. Cardiac cath unremarkable consistent with nonischemic cardiomyopathy.Therefore Doxil not chosen and she would be getting carboplatin and gemcitabine. Chemotherapy complicated by significant anemia causing supply demand cardiac ischemia and hospitalization.Patient switched to [facility] and lenvatinib starting 01/11/2020  Patient received two cycles of Keytruda but treatment was held since August 2021 due to repeated hospitalizations for possible heart failure followed by diagnosis of PE.  Interval history-patient has baseline fatigue.  She is now off oxygen and is able to perform her ADLs although with some difficulty.  Reports occasional nausea but overall appetite has been stable.  Bowel movements are regular  ECOG PS- 2 Pain scale- 0  Review of systems- Review of Systems  Constitutional: Positive for malaise/fatigue. Negative for chills, fever and weight loss.  HENT: Negative for congestion, ear discharge and nosebleeds.   Eyes: Negative for blurred vision.  Respiratory: Negative for cough, hemoptysis, sputum production, shortness of breath and wheezing.   Cardiovascular: Negative for chest pain, palpitations, orthopnea and claudication.  Gastrointestinal: Positive for nausea. Negative for abdominal pain, blood in stool, constipation, diarrhea, heartburn, melena and vomiting.  Genitourinary: Negative for dysuria, flank pain, frequency, hematuria and urgency.    Musculoskeletal: Negative for back pain, joint pain and myalgias.  Skin: Negative for rash.  Neurological: Negative for dizziness, tingling, focal weakness, seizures, weakness and headaches.  Endo/Heme/Allergies: Does not bruise/bleed  easily.  Psychiatric/Behavioral: Negative for depression and suicidal ideas. The patient does not have insomnia.       Allergies  Allergen Reactions  . Adhesive [Tape] Other (See Comments)    Skin Irritation   . Antifungal [Miconazole Nitrate] Rash  . Sulfa Antibiotics Nausea And Vomiting and Rash  . Z-Pak [Azithromycin] Itching     Past Medical History:  Diagnosis Date  . Anxiety   . Cervicalgia   . CHF (congestive heart failure) ([place])   . Chronic kidney disease   . Coronary artery disease    a. 02/2012 Stress echo: severe anterior wall ischemia;  b. 02/2012 Cath/PCI: LAD 95p (3.0 x 15 Xience EX DES), D1 90ost (PTCA - bifurcational dzs), EF 45% with anterior HK;  b. 02/2013 Ex MV: fixed anterior defect w/ minor reversibility, nl EF-->Med Rx.  . Endometrial cancer ([place])    a. 07/2016 s/p robotic hysterectomy, BSO w/ washings, sentinel node inj, mapping, bx, adhesiolysis.  . Essential hypertension, benign   . Fibrocystic breast disease   . GERD (gastroesophageal reflux disease)   . Gestational hypertension   . Heart murmur   . History of anemia   . History of blood transfusion   . Hodgkin's lymphoma ([place]) 2011   a. s/p radiation and chemo therapy  . Osteoarthritis   . Polycystic ovarian disease      Past Surgical History:  Procedure Laterality Date  . ABDOMINAL HYSTERECTOMY    . bladder sling    . CARDIAC CATHETERIZATION  02/2012   [facility] 1 stent place  . CERVICAL POLYPECTOMY    . CHOLECYSTECTOMY  1982  . COLONOSCOPY WITH PROPOFOL N/A 02/05/2015   Procedure: COLONOSCOPY WITH PROPOFOL;  Surgeon: Lucilla Lame, MD;  Location: [facility] ENDOSCOPY;  Service: Endoscopy;  Laterality: N/A;  . CORONARY ANGIOPLASTY  02/2012   left/right s/p balloon  .  CYSTOGRAM N/A 08/17/2016   Procedure: CYSTOGRAM;  Surgeon: Hollice Espy, MD;  Location: [facility] ORS;  Service: Urology;  Laterality: N/A;  . CYSTOSCOPY N/A 08/17/2016   Procedure: CYSTOSCOPY EXAM UNDER ANESTHESIA;  Surgeon: Hollice Espy, MD;  Location: [facility] ORS;  Service: Urology;  Laterality: N/A;  . CYSTOSCOPY W/ RETROGRADES Bilateral 08/17/2016   Procedure: CYSTOSCOPY WITH RETROGRADE PYELOGRAM;  Surgeon: Hollice Espy, MD;  Location: [facility] ORS;  Service: Urology;  Laterality: Bilateral;  . CYSTOSCOPY WITH STENT PLACEMENT Right 08/17/2016   Procedure: CYSTOSCOPY WITH STENT PLACEMENT;  Surgeon: Hollice Espy, MD;  Location: [facility] ORS;  Service: Urology;  Laterality: Right;  . heart stent'  2013  . kidney stent Right 2018  . LYMPH NODE BIOPSY  2011   diagnosis of hodgkins lymphoma  . PELVIC LYMPH NODE DISSECTION N/A 07/29/2016   Procedure: PELVIC/AORTIC LYMPH NODE SAMPLING;  Surgeon: [name], MD;  Location: [facility] ORS;  Service: Gynecology;  Laterality: N/A;  . PORTA CATH INSERTION N/A 09/22/2016   Procedure: Glori Luis Cath Insertion;  Surgeon: [name], MD;  Location: [place] [facility];  Service: Cardiovascular;  Laterality: N/A;  . PORTA CATH INSERTION N/A 10/27/2019   Procedure: PORTA CATH INSERTION;  Surgeon: [name], MD;  Location: [place] [facility];  Service: Cardiovascular;  Laterality: N/A;  . PORTA CATH REMOVAL N/A 11/17/2016   Procedure: Glori Luis Cath Removal;  Surgeon: [name], MD;  Location: [place] [facility];  Service: Cardiovascular;  Laterality: N/A;  . RIGHT/LEFT HEART CATH AND CORONARY ANGIOGRAPHY N/A 12/04/2019   Procedure: RIGHT/LEFT HEART CATH AND CORONARY ANGIOGRAPHY;  Surgeon: Wellington Hampshire,  MD;  Location: [place] [facility];  Service: Cardiovascular;  Laterality: N/A;  . ROBOTIC ASSISTED TOTAL HYSTERECTOMY WITH BILATERAL SALPINGO OOPHERECTOMY N/A 07/29/2016   Procedure: ROBOTIC ASSISTED TOTAL HYSTERECTOMY WITH BILATERAL SALPINGO  OOPHORECTOMY;  Surgeon: [name], MD;  Location: [facility] OR[facility];  Service: Gynecology;  Laterality: N/A;  . SENTINEL NODE BIOPSY N/A 07/29/2016   Procedure: SENTINEL NODE BIOPSY;  Surgeon: [name], MD;  Location: [facility];  Service: Gynecology;  Laterality: N/A;  . transobturator sling N/A 2009   [place]    Social History   Socioeconomic History  . Marital status: Widowed    Spouse name: Not on file  . Number of children: Not on file  . Years of education: Not on file  . Highest education level: Not on file  Occupational History  . Not on file  Tobacco Use  . Smoking status: Former Smoker    Packs/day: 1.00    Years: 30.00    Pack years: 30.00    Types: Cigarettes    Quit date: 07/24/2002    Years since quitting: 17.7  . Smokeless tobacco: Never Used  . Tobacco comment: quit smoking in 2000  Vaping Use  . Vaping Use: Never used  Substance and Sexual Activity  . Alcohol use: Not Currently  . Drug use: No  . Sexual activity: Never  Other Topics Concern  . 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    Social Determinants of Health   Financial Resource Strain:   . Difficulty of Paying Living Expenses: Not on file  Food Insecurity:   . Worried About Charity fundraiser in the Last Year: Not on file  . Ran Out of Food in the Last Year: Not on file  Transportation Needs:   . Lack of Transportation (Medical): Not on file  . Lack of Transportation (Non-Medical): Not on file  Physical Activity:   . Days of Exercise per Week: Not on file  . Minutes of Exercise per Session: Not on file  Stress:   . Feeling of Stress : Not on file  Social Connections:   . Frequency of Communication with Friends and Family: Not on file  . Frequency of Social Gatherings with Friends and Family: Not on file  . Attends Religious Services: Not on file  . Active Member of Clubs or  Organizations: Not on file  . Attends Archivist Meetings: Not on file  . Marital Status: Not on file  Intimate Partner Violence:   . Fear of Current or Ex-Partner: Not on file  . Emotionally Abused: Not on file  . Physically Abused: Not on file  . Sexually Abused: Not on file    Family History  Problem Relation Age of Onset  . ALS Father   . Polymyositis Father   . Diabetes Brother   . Cancer Maternal Aunt        breast  . Breast cancer Maternal Aunt        30's  . Stroke Maternal Grandmother   . Cancer Maternal Grandfather        prostate  . Stroke Maternal Grandfather   . Colon cancer Maternal Aunt   . Non-Hodgkin's lymphoma Cousin      Current Outpatient Medications:  .  amitriptyline (ELAVIL) 25 MG tablet, Take 1 tablet (25 mg total) by mouth at bedtime. May increase weekly as needed,  Disp: 90 tablet, Rfl: 3 .  apixaban (ELIQUIS) 2.5 MG TABS tablet, Take 1 tablet (2.5 mg total) by mouth 2 (two) times daily., Disp: 60 tablet, Rfl: 1 .  carvedilol (COREG) 3.125 MG tablet, Take 1 tablet (3.125 mg total) by mouth 2 (two) times daily with a meal., Disp: 90 tablet, Rfl: 1 .  cholecalciferol (VITAMIN D3) 25 MCG (1000 UNIT) tablet, Take 1,000 Units by mouth in the morning and at bedtime., Disp: , Rfl:  .  cyanocobalamin (,VITAMIN B-12,) 1000 MCG/ML injection, INJECT 1ML IM WEEKLY FOR 4 WEEKS, THEN INJECT MONTHLY THEREAFTER (Patient taking differently: Inject 1,000 mcg into the skin every 30 (thirty) days. ), Disp: 10 mL, Rfl: 0 .  furosemide (LASIX) 20 MG tablet, Take 1 tablet (20 mg total) by mouth as needed for fluid or edema (Take one tablet daily as needed for lower extremity edema or for weight gain of 2 lbs overnight or 5 lbs in one week.). (Patient taking differently: Take 20 mg by mouth daily. ), Disp: 30 tablet, Rfl: 2 .  levothyroxine (SYNTHROID) 75 MCG tablet, Take 1 tablet (75 mcg total) by mouth daily before breakfast., Disp: 30 tablet, Rfl: 2 .  LORazepam  (ATIVAN) 0.5 MG tablet, Take 1 tablet (0.5 mg total) by mouth every 6 (six) hours as needed for anxiety (and to help with breathing)., Disp: 120 tablet, Rfl: 0 .  losartan (COZAAR) 25 MG tablet, Take 0.5 tablets (12.5 mg total) by mouth daily., Disp: 15 tablet, Rfl: 2 .  Morphine Sulfate (MORPHINE CONCENTRATE) 10 mg / 0.5 ml concentrated solution, Take 0.25 mLs (5 mg total) by mouth every 6 (six) hours as needed for severe pain or shortness of breath., Disp: 30 mL, Rfl: 0 .  pantoprazole (PROTONIX) 20 MG tablet, Take 1 tablet (20 mg total) by mouth daily., Disp: 60 tablet, Rfl: 1 .  potassium chloride (KLOR-CON) 10 MEQ tablet, Take 1 tablet (10 mEq total) by mouth daily., Disp: 90 tablet, Rfl: 1 .  atorvastatin (LIPITOR) 10 MG tablet, Take 1 tablet by mouth once daily (Patient taking differently: Take 10 mg by mouth every evening. ), Disp: 90 tablet, Rfl: 2 .  triamcinolone cream (KENALOG) 0.1 %, Apply 1 application topically 2 (two) times daily. 28.4 g tube, Disp: 30 g, Rfl: 0 No current facility-administered medications for this visit.  Facility-Administered Medications Ordered in Other Visits:  .  heparin lock flush 100 unit/mL, 500 Units, Intravenous, Once, Randa Evens C, MD .  sodium chloride flush (NS) 0.9 % injection 10 mL, 10 mL, Intravenous, PRN, Sindy Guadeloupe, MD, 10 mL at 12/07/19 0914 .  sodium chloride flush (NS) 0.9 % injection 10 mL, 10 mL, Intravenous, PRN, Sindy Guadeloupe, MD, 10 mL at 01/04/20 0850  Physical exam:  Vitals:   04/05/20 0902 04/05/20 0904 04/05/20 0905  BP: (!) 84/51 (!) 76/47 (!) 78/53  Pulse: (!) 56 (!) 58 (!) 104  Temp: (!) 97.3 F (36.3 C)    TempSrc: Tympanic    SpO2: 100%    Weight: 150 lb 4.8 oz (68.2 kg)     Physical Exam Constitutional:      General: She is not in acute distress.    Comments: Appears fatigued  Cardiovascular:     Rate and Rhythm: Normal rate and regular rhythm.     Heart sounds: Normal heart sounds.  Pulmonary:     Effort:  Pulmonary effort is normal.     Breath sounds: Normal breath sounds.  Abdominal:  General: Bowel sounds are normal. There is no distension.     Palpations: Abdomen is soft.     Tenderness: There is no abdominal tenderness.  Skin:    General: Skin is warm and dry.  Neurological:     Mental Status: She is alert and oriented to person, place, and time.      CMP Latest Ref Rng & Units 03/29/2020  Glucose 70 - 99 mg/dL 140(H)  BUN 8 - 23 mg/dL 16  Creatinine 0.44 - 1.00 mg/dL 1.37(H)  Sodium 135 - 145 mmol/L 138  Potassium 3.5 - 5.1 mmol/L 3.8  Chloride 98 - 111 mmol/L 102  CO2 22 - 32 mmol/L 27  Calcium 8.9 - 10.3 mg/dL 8.5(L)  Total Protein 6.5 - 8.1 g/dL 5.9(L)  Total Bilirubin 0.3 - 1.2 mg/dL 1.4(H)  Alkaline Phos 38 - 126 U/L 72  AST 15 - 41 U/L 20  ALT 0 - 44 U/L 12   CBC Latest Ref Rng & Units 03/29/2020  WBC 4.0 - 10.5 K/uL 4.8  Hemoglobin 12.0 - 15.0 g/dL 8.7(L)  Hematocrit 36 - 46 % 27.4(L)  Platelets 150 - 400 K/uL 157     Assessment and plan- Patient is a 69 y.o. female with recurrent endometrial carcinoma with peritoneal metastases here for on treatment assessment prior to cycle three of palliative Keytruda  Patient's treatment was on hold since August 2021 when she had repeated hospitalizations for possible heart failure.  At that time even discussions regarding home hospice were considered.  Patient was not interested in pursuing with hospice and we gave her more time to see if her performance status improved to the point that we could restart treatment in the future.  We are therefore proceeding with cycle three of palliative Keytruda today.  I have asked her to hold off on taking lenvatinib at this time and see how she tolerates Keytruda first.  If she has further complications and hospitalizations after this treatment patient understands that hospice would be the next step.  I will see her back in 3 weeks with CBC with differential, CMP and CA-125 for cycle  four of Keytruda.I will also see her entire him in 10 days time to assess if she needs any IV fluids and how she is tolerating treatment. Visit Diagnosis 1. Endometrial cancer ([place])   2. Encounter for antineoplastic immunotherapy   3. Other specified hypothyroidism      Dr. Randa Evens, MD, MPH [facility] [phone] 04/07/2020 9:36 AM

## 2020-04-11 ENCOUNTER — Encounter: Payer: Self-pay | Admitting: Oncology

## 2020-04-12 ENCOUNTER — Telehealth: Payer: Self-pay | Admitting: *Deleted

## 2020-04-12 NOTE — Telephone Encounter (Signed)
Called pt and let her know that when she comes on 11/1 I can help with her paper work. She is agreeable

## 2020-04-15 ENCOUNTER — Inpatient Hospital Stay: Payer: PPO | Attending: Oncology

## 2020-04-15 ENCOUNTER — Other Ambulatory Visit: Payer: Self-pay | Admitting: Pharmacist

## 2020-04-15 ENCOUNTER — Inpatient Hospital Stay: Payer: PPO

## 2020-04-15 ENCOUNTER — Encounter: Payer: Self-pay | Admitting: Oncology

## 2020-04-15 ENCOUNTER — Inpatient Hospital Stay (HOSPITAL_BASED_OUTPATIENT_CLINIC_OR_DEPARTMENT_OTHER): Payer: PPO | Admitting: Oncology

## 2020-04-15 ENCOUNTER — Other Ambulatory Visit: Payer: Self-pay

## 2020-04-15 VITALS — BP 95/42 | HR 91 | Temp 97.5°F | Resp 20 | Wt 151.6 lb

## 2020-04-15 DIAGNOSIS — Z881 Allergy status to other antibiotic agents status: Secondary | ICD-10-CM | POA: Diagnosis not present

## 2020-04-15 DIAGNOSIS — Z7901 Long term (current) use of anticoagulants: Secondary | ICD-10-CM | POA: Insufficient documentation

## 2020-04-15 DIAGNOSIS — Z888 Allergy status to other drugs, medicaments and biological substances status: Secondary | ICD-10-CM | POA: Diagnosis not present

## 2020-04-15 DIAGNOSIS — Z90722 Acquired absence of ovaries, bilateral: Secondary | ICD-10-CM | POA: Insufficient documentation

## 2020-04-15 DIAGNOSIS — I130 Hypertensive heart and chronic kidney disease with heart failure and stage 1 through stage 4 chronic kidney disease, or unspecified chronic kidney disease: Secondary | ICD-10-CM | POA: Insufficient documentation

## 2020-04-15 DIAGNOSIS — N1831 Chronic kidney disease, stage 3a: Secondary | ICD-10-CM | POA: Insufficient documentation

## 2020-04-15 DIAGNOSIS — Z8269 Family history of other diseases of the musculoskeletal system and connective tissue: Secondary | ICD-10-CM | POA: Insufficient documentation

## 2020-04-15 DIAGNOSIS — Z803 Family history of malignant neoplasm of breast: Secondary | ICD-10-CM | POA: Insufficient documentation

## 2020-04-15 DIAGNOSIS — C786 Secondary malignant neoplasm of retroperitoneum and peritoneum: Secondary | ICD-10-CM | POA: Insufficient documentation

## 2020-04-15 DIAGNOSIS — Z9049 Acquired absence of other specified parts of digestive tract: Secondary | ICD-10-CM | POA: Insufficient documentation

## 2020-04-15 DIAGNOSIS — Z90710 Acquired absence of both cervix and uterus: Secondary | ICD-10-CM | POA: Insufficient documentation

## 2020-04-15 DIAGNOSIS — Z5112 Encounter for antineoplastic immunotherapy: Secondary | ICD-10-CM | POA: Insufficient documentation

## 2020-04-15 DIAGNOSIS — R5383 Other fatigue: Secondary | ICD-10-CM | POA: Diagnosis not present

## 2020-04-15 DIAGNOSIS — Z800 Family history of malignant neoplasm of digestive organs: Secondary | ICD-10-CM | POA: Insufficient documentation

## 2020-04-15 DIAGNOSIS — Z87891 Personal history of nicotine dependence: Secondary | ICD-10-CM | POA: Insufficient documentation

## 2020-04-15 DIAGNOSIS — D638 Anemia in other chronic diseases classified elsewhere: Secondary | ICD-10-CM

## 2020-04-15 DIAGNOSIS — D6959 Other secondary thrombocytopenia: Secondary | ICD-10-CM

## 2020-04-15 DIAGNOSIS — Z923 Personal history of irradiation: Secondary | ICD-10-CM | POA: Insufficient documentation

## 2020-04-15 DIAGNOSIS — Z8571 Personal history of Hodgkin lymphoma: Secondary | ICD-10-CM | POA: Diagnosis not present

## 2020-04-15 DIAGNOSIS — G620 Drug-induced polyneuropathy: Secondary | ICD-10-CM | POA: Insufficient documentation

## 2020-04-15 DIAGNOSIS — T451X5A Adverse effect of antineoplastic and immunosuppressive drugs, initial encounter: Secondary | ICD-10-CM

## 2020-04-15 DIAGNOSIS — Z833 Family history of diabetes mellitus: Secondary | ICD-10-CM | POA: Insufficient documentation

## 2020-04-15 DIAGNOSIS — Z808 Family history of malignant neoplasm of other organs or systems: Secondary | ICD-10-CM | POA: Diagnosis not present

## 2020-04-15 DIAGNOSIS — I2510 Atherosclerotic heart disease of native coronary artery without angina pectoris: Secondary | ICD-10-CM | POA: Diagnosis not present

## 2020-04-15 DIAGNOSIS — Z95828 Presence of other vascular implants and grafts: Secondary | ICD-10-CM

## 2020-04-15 DIAGNOSIS — Z820 Family history of epilepsy and other diseases of the nervous system: Secondary | ICD-10-CM | POA: Insufficient documentation

## 2020-04-15 DIAGNOSIS — C541 Malignant neoplasm of endometrium: Secondary | ICD-10-CM

## 2020-04-15 DIAGNOSIS — Z882 Allergy status to sulfonamides status: Secondary | ICD-10-CM | POA: Diagnosis not present

## 2020-04-15 DIAGNOSIS — Z79899 Other long term (current) drug therapy: Secondary | ICD-10-CM | POA: Diagnosis not present

## 2020-04-15 DIAGNOSIS — Z8042 Family history of malignant neoplasm of prostate: Secondary | ICD-10-CM | POA: Insufficient documentation

## 2020-04-15 LAB — COMPREHENSIVE METABOLIC PANEL
ALT: 14 U/L (ref 0–44)
AST: 28 U/L (ref 15–41)
Albumin: 3.3 g/dL — ABNORMAL LOW (ref 3.5–5.0)
Alkaline Phosphatase: 74 U/L (ref 38–126)
Anion gap: 9 (ref 5–15)
BUN: 19 mg/dL (ref 8–23)
CO2: 26 mmol/L (ref 22–32)
Calcium: 8.8 mg/dL — ABNORMAL LOW (ref 8.9–10.3)
Chloride: 101 mmol/L (ref 98–111)
Creatinine, Ser: 1.5 mg/dL — ABNORMAL HIGH (ref 0.44–1.00)
GFR, Estimated: 37 mL/min — ABNORMAL LOW (ref >60)
Glucose, Bld: 133 mg/dL — ABNORMAL HIGH (ref 70–99)
Potassium: 3.3 mmol/L — ABNORMAL LOW (ref 3.5–5.1)
Sodium: 136 mmol/L (ref 135–145)
Total Bilirubin: 1.1 mg/dL (ref 0.3–1.2)
Total Protein: 6 g/dL — ABNORMAL LOW (ref 6.5–8.1)

## 2020-04-15 LAB — CBC WITH DIFFERENTIAL/PLATELET
Abs Immature Granulocytes: 0.03 10*3/uL (ref 0.00–0.07)
Basophils Absolute: 0.1 10*3/uL (ref 0.0–0.1)
Basophils Relative: 1 %
Eosinophils Absolute: 0.5 10*3/uL (ref 0.0–0.5)
Eosinophils Relative: 7 %
HCT: 28.4 % — ABNORMAL LOW (ref 36.0–46.0)
Hemoglobin: 9 g/dL — ABNORMAL LOW (ref 12.0–15.0)
Immature Granulocytes: 0 %
Lymphocytes Relative: 13 %
Lymphs Abs: 0.9 10*3/uL (ref 0.7–4.0)
MCH: 29.2 pg (ref 26.0–34.0)
MCHC: 31.7 g/dL (ref 30.0–36.0)
MCV: 92.2 fL (ref 80.0–100.0)
Monocytes Absolute: 0.6 10*3/uL (ref 0.1–1.0)
Monocytes Relative: 8 %
Neutro Abs: 5.2 10*3/uL (ref 1.7–7.7)
Neutrophils Relative %: 71 %
Platelets: 210 10*3/uL (ref 150–400)
RBC: 3.08 MIL/uL — ABNORMAL LOW (ref 3.87–5.11)
RDW: 15.2 % (ref 11.5–15.5)
WBC: 7.3 10*3/uL (ref 4.0–10.5)
nRBC: 0 % (ref 0.0–0.2)

## 2020-04-15 LAB — TSH: TSH: 0.191 u[IU]/mL — ABNORMAL LOW (ref 0.350–4.500)

## 2020-04-15 MED ORDER — LENVIMA (8 MG DAILY DOSE) 2 X 4 MG PO CPPK: 8.0000 mg | ORAL_CAPSULE | Freq: Every day | ORAL | 2 refills | Status: DC

## 2020-04-15 MED ORDER — HEPARIN SOD (PORK) LOCK FLUSH 100 UNIT/ML IV SOLN
INTRAVENOUS | Status: AC
  Filled 2020-04-15: qty 5

## 2020-04-15 MED ORDER — SODIUM CHLORIDE 0.9% FLUSH
10.0000 mL | Freq: Once | INTRAVENOUS | Status: AC
  Administered 2020-04-15: 10 mL via INTRAVENOUS
  Filled 2020-04-15: qty 10

## 2020-04-15 MED ORDER — HEPARIN SOD (PORK) LOCK FLUSH 100 UNIT/ML IV SOLN
500.0000 [IU] | Freq: Once | INTRAVENOUS | Status: AC
  Administered 2020-04-15: 500 [IU] via INTRAVENOUS
  Filled 2020-04-15: qty 5

## 2020-04-15 NOTE — Progress Notes (Signed)
Patient states she had some spotting yesterday. Patient also states rash is still there.

## 2020-04-16 LAB — CA 125: Cancer Antigen (CA) 125: 32 U/mL (ref 0.0–38.1)

## 2020-04-18 NOTE — Progress Notes (Signed)
Hematology/Oncology Consult note [facility]  [phone] [phone]  Patient Care Team: [name], MD as PCP - General (Internal Medicine) Wellington Hampshire, MD as PCP - Cardiology (Cardiology) [name], MD as Consulting Physician (General Surgery) Mellody Drown, MD as Referring Physician (Obstetrics and Gynecology) [name], MD as Referring Physician (Obstetrics and Gynecology) Hollice Espy, MD as Consulting Physician (Urology) [name], RN as Registered Nurse Sindy Guadeloupe, MD as Consulting Physician (Oncology)   Name of the patient: Jaime Hall  [id number]  10-18-1950   Date of visit: 04/18/20  Diagnosis- 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 She underwent robotic hysterectomy with bilateral salpingo-oophorectomy with washings and sentinel lymph node injection mapping and biopsy in February 2018. She had multiple postoperative complications and completed 3 cycles of carbotaxol chemotherapy in May 2018. Treatment was complicated by chemo-induced peripheral neuropathy for which Taxol dose was reduced by 25%. She completed 5 weeks of external beam whole pelvic radiation on August 2018.Her original tumor in 2018 was ER +1%. PR negative. MSI stable and HER-2 negative  She was under clinical surveillance and recently presented to [name] with symptoms of right lower quadrant abdominal pain which led to aCT abdomen which showed new peritoneal metastatic disease throughout the right abdomen and pelvis with index masses measuring 5.7 x 4.7 cm. Head CT scan also  showed evidence of hypermetabolic abdominal pelvic peritoneal metastases but no evidence of other sites of metastases.  Repeat omental biopsy confirms high grade serous carcinoma.Foundation 1 testing showed following alteration/biomarkers. Microsatellite status stable. EPH for be amplification, ESR 1 amplification, HNF1 A G 292 FS 25, follow-up 1 amplification, with 3 ER 1 loss, PPP 2R1 AAS 256F, PTEN loss, T p53. However none of these mutations have actionable targets.  Repeat echocardiogram showed EF of 30 to 35%. Cardiac cath unremarkable consistent with nonischemic cardiomyopathy.Therefore Doxil not chosen and she would be getting carboplatin and gemcitabine. Chemotherapy complicated by significant anemia causing supply demand cardiac ischemia and hospitalization.Patient switched to [facility] and lenvatinib starting 01/11/2020  Patient received two cycles of Keytruda but treatment was held since August 2021 due to repeated hospitalizations for possible heart failure followed by diagnosis of PE.  Interval history-she is able to perform her ADLs.  Has not had any falls.  She is not using her oxygen.  Has baseline fatigue but denies any worsening shortness of breath except on moderate exertion.  ECOG PS- 2 Pain scale- 0 Opioid associated constipation- 0  Review of systems- Review of Systems  Constitutional: Positive for malaise/fatigue. Negative for chills, fever and weight loss.  HENT: Negative for congestion, ear discharge and nosebleeds.   Eyes: Negative for blurred vision.  Respiratory: Negative for cough, hemoptysis, sputum production, shortness of breath and wheezing.   Cardiovascular: Negative for chest pain, palpitations, orthopnea and claudication.  Gastrointestinal: Negative for abdominal pain, blood in stool, constipation, diarrhea, heartburn, melena, nausea and vomiting.  Genitourinary: Negative for dysuria, flank pain, frequency, hematuria and urgency.  Musculoskeletal:  Negative for back pain, joint pain and myalgias.  Skin: Negative for rash.  Neurological: Negative for dizziness, tingling, focal weakness, seizures, weakness and headaches.  Endo/Heme/Allergies: Does not bruise/bleed  easily.  Psychiatric/Behavioral: Negative for depression and suicidal ideas. The patient does not have insomnia.       Allergies  Allergen Reactions  . Adhesive [Tape] Other (See Comments)    Skin Irritation   . Antifungal [Miconazole Nitrate] Rash  . Sulfa Antibiotics Nausea And Vomiting and Rash  . Z-Pak [Azithromycin] Itching     Past Medical History:  Diagnosis Date  . Anxiety   . Cervicalgia   . CHF (congestive heart failure) ([place])   . Chronic kidney disease   . Coronary artery disease    a. 02/2012 Stress echo: severe anterior wall ischemia;  b. 02/2012 Cath/PCI: LAD 95p (3.0 x 15 Xience EX DES), D1 90ost (PTCA - bifurcational dzs), EF 45% with anterior HK;  b. 02/2013 Ex MV: fixed anterior defect w/ minor reversibility, nl EF-->Med Rx.  . Endometrial cancer ([place])    a. 07/2016 s/p robotic hysterectomy, BSO w/ washings, sentinel node inj, mapping, bx, adhesiolysis.  . Essential hypertension, benign   . Fibrocystic breast disease   . GERD (gastroesophageal reflux disease)   . Gestational hypertension   . Heart murmur   . History of anemia   . History of blood transfusion   . Hodgkin's lymphoma ([place]) 2011   a. s/p radiation and chemo therapy  . Osteoarthritis   . Polycystic ovarian disease      Past Surgical History:  Procedure Laterality Date  . ABDOMINAL HYSTERECTOMY    . bladder sling    . CARDIAC CATHETERIZATION  02/2012   [facility] 1 stent place  . CERVICAL POLYPECTOMY    . CHOLECYSTECTOMY  1982  . COLONOSCOPY WITH PROPOFOL N/A 02/05/2015   Procedure: COLONOSCOPY WITH PROPOFOL;  Surgeon: Lucilla Lame, MD;  Location: [facility] ENDOSCOPY;  Service: Endoscopy;  Laterality: N/A;  . CORONARY ANGIOPLASTY  02/2012   left/right s/p balloon  . CYSTOGRAM N/A 08/17/2016    Procedure: CYSTOGRAM;  Surgeon: Hollice Espy, MD;  Location: [facility] ORS;  Service: Urology;  Laterality: N/A;  . CYSTOSCOPY N/A 08/17/2016   Procedure: CYSTOSCOPY EXAM UNDER ANESTHESIA;  Surgeon: Hollice Espy, MD;  Location: [facility] ORS;  Service: Urology;  Laterality: N/A;  . CYSTOSCOPY W/ RETROGRADES Bilateral 08/17/2016   Procedure: CYSTOSCOPY WITH RETROGRADE PYELOGRAM;  Surgeon: Hollice Espy, MD;  Location: [facility] ORS;  Service: Urology;  Laterality: Bilateral;  . CYSTOSCOPY WITH STENT PLACEMENT Right 08/17/2016   Procedure: CYSTOSCOPY WITH STENT PLACEMENT;  Surgeon: Hollice Espy, MD;  Location: [facility] ORS;  Service: Urology;  Laterality: Right;  . heart stent'  2013  . kidney stent Right 2018  . LYMPH NODE BIOPSY  2011   diagnosis of hodgkins lymphoma  . PELVIC LYMPH NODE DISSECTION N/A 07/29/2016   Procedure: PELVIC/AORTIC LYMPH NODE SAMPLING;  Surgeon: [name], MD;  Location: [facility] ORS;  Service: Gynecology;  Laterality: N/A;  . PORTA CATH INSERTION N/A 09/22/2016   Procedure: Glori Luis Cath Insertion;  Surgeon: [name], MD;  Location: [place] [facility];  Service: Cardiovascular;  Laterality: N/A;  . PORTA CATH INSERTION N/A 10/27/2019   Procedure: PORTA CATH INSERTION;  Surgeon: [name], MD;  Location: [place] [facility];  Service: Cardiovascular;  Laterality: N/A;  . PORTA CATH REMOVAL N/A 11/17/2016   Procedure: Glori Luis Cath Removal;  Surgeon: [name], MD;  Location: [place] [facility];  Service: Cardiovascular;  Laterality: N/A;  . RIGHT/LEFT HEART CATH AND CORONARY ANGIOGRAPHY N/A 12/04/2019   Procedure: RIGHT/LEFT HEART CATH AND CORONARY ANGIOGRAPHY;  Surgeon: Wellington Hampshire,  MD;  Location: [facility] [facility];  Service: Cardiovascular;  Laterality: N/A;  . ROBOTIC ASSISTED TOTAL HYSTERECTOMY WITH BILATERAL SALPINGO OOPHERECTOMY N/A 07/29/2016   Procedure: ROBOTIC ASSISTED TOTAL HYSTERECTOMY WITH BILATERAL SALPINGO OOPHORECTOMY;  Surgeon:  [name], MD;  Location: [facility] ORS;  Service: Gynecology;  Laterality: N/A;  . SENTINEL NODE BIOPSY N/A 07/29/2016   Procedure: SENTINEL NODE BIOPSY;  Surgeon: [name], MD;  Location: [facility] ORS;  Service: Gynecology;  Laterality: N/A;  . transobturator sling N/A 2009   [place]    Social History   Socioeconomic History  . Marital status: Widowed    Spouse name: Not on file  . Number of children: Not on file  . Years of education: Not on file  . Highest education level: Not on file  Occupational History  . Not on file  Tobacco Use  . Smoking status: Former Smoker    Packs/day: 1.00    Years: 30.00    Pack years: 30.00    Types: Cigarettes    Quit date: 07/24/2002    Years since quitting: 17.7  . Smokeless tobacco: Never Used  . Tobacco comment: quit smoking in 2000  Vaping Use  . Vaping Use: Never used  Substance and Sexual Activity  . Alcohol use: Not Currently  . Drug use: No  . Sexual activity: Never  Other Topics Concern  . 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    Social Determinants of Health   Financial Resource Strain:   . Difficulty of Paying Living Expenses: Not on file  Food Insecurity:   . Worried About Charity fundraiser in the Last Year: Not on file  . Ran Out of Food in the Last Year: Not on file  Transportation Needs:   . Lack of Transportation (Medical): Not on file  . Lack of Transportation (Non-Medical): Not on file  Physical Activity:   . Days of Exercise per Week: Not on file  . Minutes of Exercise per Session: Not on file  Stress:   . Feeling of Stress : Not on file  Social Connections:   . Frequency of Communication with Friends and Family: Not on file  . Frequency of Social Gatherings with Friends and Family: Not on file  . Attends Religious Services: Not on file  . Active Member of Clubs or Organizations: Not on file  .  Attends Archivist Meetings: Not on file  . Marital Status: Not on file  Intimate Partner Violence:   . Fear of Current or Ex-Partner: Not on file  . Emotionally Abused: Not on file  . Physically Abused: Not on file  . Sexually Abused: Not on file    Family History  Problem Relation Age of Onset  . ALS Father   . Polymyositis Father   . Diabetes Brother   . Cancer Maternal Aunt        breast  . Breast cancer Maternal Aunt        30's  . Stroke Maternal Grandmother   . Cancer Maternal Grandfather        prostate  . Stroke Maternal Grandfather   . Colon cancer Maternal Aunt   . Non-Hodgkin's lymphoma Cousin      Current Outpatient Medications:  .  amitriptyline (ELAVIL) 25 MG tablet, Take 1 tablet (25 mg total) by mouth at bedtime. May increase weekly as needed,  Disp: 90 tablet, Rfl: 3 .  apixaban (ELIQUIS) 2.5 MG TABS tablet, Take 1 tablet (2.5 mg total) by mouth 2 (two) times daily., Disp: 60 tablet, Rfl: 1 .  atorvastatin (LIPITOR) 10 MG tablet, Take 1 tablet by mouth once daily (Patient taking differently: Take 10 mg by mouth every evening. ), Disp: 90 tablet, Rfl: 2 .  carvedilol (COREG) 3.125 MG tablet, Take 1 tablet (3.125 mg total) by mouth 2 (two) times daily with a meal., Disp: 90 tablet, Rfl: 1 .  cholecalciferol (VITAMIN D3) 25 MCG (1000 UNIT) tablet, Take 1,000 Units by mouth in the morning and at bedtime., Disp: , Rfl:  .  cyanocobalamin (,VITAMIN B-12,) 1000 MCG/ML injection, INJECT 1ML IM WEEKLY FOR 4 WEEKS, THEN INJECT MONTHLY THEREAFTER (Patient taking differently: Inject 1,000 mcg into the skin every 30 (thirty) days. ), Disp: 10 mL, Rfl: 0 .  furosemide (LASIX) 20 MG tablet, Take 1 tablet (20 mg total) by mouth as needed for fluid or edema (Take one tablet daily as needed for lower extremity edema or for weight gain of 2 lbs overnight or 5 lbs in one week.). (Patient taking differently: Take 20 mg by mouth daily. ), Disp: 30 tablet, Rfl: 2 .   levothyroxine (SYNTHROID) 75 MCG tablet, Take 1 tablet (75 mcg total) by mouth daily before breakfast., Disp: 30 tablet, Rfl: 2 .  LORazepam (ATIVAN) 0.5 MG tablet, Take 1 tablet (0.5 mg total) by mouth every 6 (six) hours as needed for anxiety (and to help with breathing)., Disp: 120 tablet, Rfl: 0 .  losartan (COZAAR) 25 MG tablet, Take 0.5 tablets (12.5 mg total) by mouth daily., Disp: 15 tablet, Rfl: 2 .  Morphine Sulfate (MORPHINE CONCENTRATE) 10 mg / 0.5 ml concentrated solution, Take 0.25 mLs (5 mg total) by mouth every 6 (six) hours as needed for severe pain or shortness of breath., Disp: 30 mL, Rfl: 0 .  pantoprazole (PROTONIX) 20 MG tablet, Take 1 tablet (20 mg total) by mouth daily., Disp: 60 tablet, Rfl: 1 .  potassium chloride (KLOR-CON) 10 MEQ tablet, Take 1 tablet (10 mEq total) by mouth daily., Disp: 90 tablet, Rfl: 1 .  triamcinolone cream (KENALOG) 0.1 %, Apply 1 application topically 2 (two) times daily. 28.4 g tube, Disp: 30 g, Rfl: 0 .  lenvatinib 8 mg daily dose (LENVIMA, 8 MG DAILY DOSE,) 2 x 4 MG capsule, Take 2 capsules (8 mg total) by mouth daily., Disp: 60 capsule, Rfl: 2 No current facility-administered medications for this visit.  Facility-Administered Medications Ordered in Other Visits:  .  heparin lock flush 100 unit/mL, 500 Units, Intravenous, Once, Randa Evens C, MD .  sodium chloride flush (NS) 0.9 % injection 10 mL, 10 mL, Intravenous, PRN, Sindy Guadeloupe, MD, 10 mL at 12/07/19 0914 .  sodium chloride flush (NS) 0.9 % injection 10 mL, 10 mL, Intravenous, PRN, Sindy Guadeloupe, MD, 10 mL at 01/04/20 0850  Physical exam:  Vitals:   04/15/20 0840  BP: (!) 95/42  Pulse: 91  Resp: 20  Temp: (!) 97.5 F (36.4 C)  SpO2: 100%  Weight: 151 lb 9.6 oz (68.8 kg)   Physical Exam Constitutional:      General: She is not in acute distress. Cardiovascular:     Rate and Rhythm: Normal rate and regular rhythm.     Heart sounds: Normal heart sounds.  Pulmonary:      Effort: Pulmonary effort is normal.     Breath sounds: Normal breath sounds.  Abdominal:     General: Bowel sounds are normal.     Palpations: Abdomen is soft.  Skin:    General: Skin is warm and dry.  Neurological:     Mental Status: She is alert and oriented to person, place, and time.      CMP Latest Ref Rng & Units 04/15/2020  Glucose 70 - 99 mg/dL 133(H)  BUN 8 - 23 mg/dL 19  Creatinine 0.44 - 1.00 mg/dL 1.50(H)  Sodium 135 - 145 mmol/L 136  Potassium 3.5 - 5.1 mmol/L 3.3(L)  Chloride 98 - 111 mmol/L 101  CO2 22 - 32 mmol/L 26  Calcium 8.9 - 10.3 mg/dL 8.8(L)  Total Protein 6.5 - 8.1 g/dL 6.0(L)  Total Bilirubin 0.3 - 1.2 mg/dL 1.1  Alkaline Phos 38 - 126 U/L 74  AST 15 - 41 U/L 28  ALT 0 - 44 U/L 14   CBC Latest Ref Rng & Units 04/15/2020  WBC 4.0 - 10.5 K/uL 7.3  Hemoglobin 12.0 - 15.0 g/dL 9.0(L)  Hematocrit 36 - 46 % 28.4(L)  Platelets 150 - 400 K/uL 210     Assessment and plan- Patient is a 69 y.o. female with recurrent endometrial carcinoma with peritoneal metastases here for toxicity check after cycle 3 of Keytruda  She is currently 2 weeks post Keytruda and tolerated it well without any significant side effects.  CKD is currently stable and she will not receive any IV fluids today.  I will see her back in 10 days time for next cycle of Keytruda on 04/26/2020.    She also has anemia of chronic disease which is presently stable.  If she has any recurrent hospitalizations or side effects with this line of treatment she will make transition to hospice  Chemo-induced peripheral neuropathy: Currently stable  Recent PE: On Eliquis 2.5 mg twice daily   Visit Diagnosis 1. Chemotherapy-induced peripheral neuropathy ([place])   2. Peritoneal metastases ([place])   3. Endometrial cancer ([place])   4. Anemia of chronic disease      Dr. Randa Evens, MD, MPH [facility] [phone] 04/18/2020 2:07 PM

## 2020-04-20 ENCOUNTER — Other Ambulatory Visit: Payer: Self-pay | Admitting: Cardiovascular Disease

## 2020-04-22 ENCOUNTER — Telehealth: Payer: Self-pay | Admitting: *Deleted

## 2020-04-22 ENCOUNTER — Inpatient Hospital Stay (HOSPITAL_BASED_OUTPATIENT_CLINIC_OR_DEPARTMENT_OTHER): Payer: PPO | Admitting: Hospice and Palliative Medicine

## 2020-04-22 DIAGNOSIS — C541 Malignant neoplasm of endometrium: Secondary | ICD-10-CM | POA: Diagnosis not present

## 2020-04-22 DIAGNOSIS — Z515 Encounter for palliative care: Secondary | ICD-10-CM | POA: Diagnosis not present

## 2020-04-22 NOTE — Progress Notes (Signed)
Virtual Visit via Video Note  I connected with Jaime Hall on 04/22/20 at  1:00 PM EST by a video enabled telemedicine application and verified that I am speaking with the correct person using two identifiers.  Location: Patient: home Provider: clinic   I discussed the limitations of evaluation and management by telemedicine and the availability of in person appointments. The patient expressed understanding and agreed to proceed.  History of Present Illness: Jaime Hall a 69 y.o.femalewith multiple medical problems including HFrEF (EF 35%) andmetastatic high-grade serous endometrial carcinomaon lengatinib + Keytruda. Patient was originally diagnosed in 2018 with stage I endometrial cancer and underwent TAH/BSO and adjuvant chemotherapy but unfortunately was found to have disease recurrence in May 2021. Patient was hospitalized 02/14/2020-9/2/2021with acute hypoxic respiratory failure and found to have acute segmental pulmonary embolus.She was readmitted 02/20/2020 - 02/22/2020 with hemoptysis and acute on chronic respiratory failure secondary to CHF exacerbation +/- multifocal PNA.Palliative care was consulted to help address goals.   Observations/Objective: Spoke with patient virtually via [place].  She reports doing reasonably well.  She denies any significant changes or concerns today.  No symptomatic complaints at present.  She continues to endorse fatigue but was able to go to the [employer] over the weekend with her daughter.  She denies shortness of breath.  She says she is not using her oxygen currently as she needs new tubing.  We will reach out to her DME supplier.  Patient does endorse occasional nausea relieved with use of antiemetics.  She says that milk and dairy products are emetogenic.  We will ask that she speaks with dietitian.  Assessment and Plan: Endometrial carcinoma -on systemic treatment with immunotherapy.  Followed by Dr. Janese Banks.  Next clinic visit on  11/12.  Referral to nutritionist.  Follow Up Instructions: Follow-up MyChart visit 1 month   I discussed the assessment and treatment plan with the patient. The patient was provided an opportunity to ask questions and all were answered. The patient agreed with the plan and demonstrated an understanding of the instructions.   The patient was advised to call back or seek an in-person evaluation if the symptoms worsen or if the condition fails to improve as anticipated.  I provided 15 minutes of non-face-to-face time during this encounter.   Irean Hong, NP

## 2020-04-22 NOTE — Telephone Encounter (Signed)
Called pt to ask about which was the provider for the oxygen tubing. She called me back and said that it is through [employer] and that she thinks they have sent it but maybe it is slowed down due to [employer], [employer]. I called Zack at [employer] and he will look into it and call me with update

## 2020-04-23 ENCOUNTER — Other Ambulatory Visit: Payer: Self-pay | Admitting: Cardiovascular Disease

## 2020-04-23 ENCOUNTER — Telehealth: Payer: Self-pay | Admitting: Cardiovascular Disease

## 2020-04-23 NOTE — Telephone Encounter (Signed)
*  STAT* If patient is at the pharmacy, call can be transferred to refill team.   1. Which medications need to be refilled? (please list name of each medication and dose if known)    Furosemide 20 mg po q d   2. Which pharmacy/location (including street and city if local pharmacy) is medication to be sent to? [facility] [address]   3. Do they need a 30 day or 90 day supply? [place]

## 2020-04-23 NOTE — Telephone Encounter (Signed)
Refill sent to pharmacy yesterday.

## 2020-04-25 ENCOUNTER — Encounter: Payer: Self-pay | Admitting: Cardiovascular Disease

## 2020-04-25 ENCOUNTER — Ambulatory Visit: Payer: PPO | Admitting: Cardiovascular Disease

## 2020-04-25 ENCOUNTER — Other Ambulatory Visit: Payer: Self-pay

## 2020-04-25 VITALS — BP 100/70 | HR 102 | Ht 61.0 in | Wt 149.5 lb

## 2020-04-25 DIAGNOSIS — I2510 Atherosclerotic heart disease of native coronary artery without angina pectoris: Secondary | ICD-10-CM

## 2020-04-25 DIAGNOSIS — I10 Essential (primary) hypertension: Secondary | ICD-10-CM

## 2020-04-25 DIAGNOSIS — E785 Hyperlipidemia, unspecified: Secondary | ICD-10-CM | POA: Diagnosis not present

## 2020-04-25 NOTE — Progress Notes (Signed)
Cardiology Office Note   Date:  04/25/2020   ID:  Jaime Hall, DOB 1951/05/04, MRN [id number]  PCP:  [name], MD  Cardiologist:   [name], MD   Chief Complaint  Patient presents with  . OTHER    12 month f/u no complaints today. Meds reviewed verbally with pt.      History of Present Illness: Jaime Hall is a 69 y.o. female who presents for a followup visit regarding coronary artery disease.  She has history of Hodgkin's lymphoma treated with radiation and chemotherapy to the chest area and has been in remission since 2011.  She also had uterine cancer in 2018 treated with surgery and chemotherapy.  She is a former smoker.   She had previous angioplasty and drug-eluting stent placement to the proximal LAD with balloon angioplasty of the diagonal in September 2013.    She was diagnosed with stage P high-grade endometrial cancer this year and received chemotherapy in May.  Echocardiogram showed an EF of 30 to 35%.  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  A right and left cardiac catheterization was done in June which showed patent LAD stent with moderate ostial LAD stenosis and no obstructive disease.  She was volume overloaded and was started on small dose furosemide. She was hospitalized in September with pulmonary embolism and was started on anticoagulation.  Subsequently, she had hemoptysis and thus Eliquis was discontinued.  Eliquis was then resumed at a lower dose of 2.5 mg twice daily and she has been tolerating the medication.  Chemotherapy was recently resumed for endometrial cancer and she is tolerating so far.  She is doing reasonably well with stable exertional dyspnea and leg edema.  No chest pain.  Past Medical History:  Diagnosis Date  . Anxiety   . Cervicalgia   . CHF (congestive heart failure) ([place])   . Chronic kidney disease   . Coronary artery disease    a.  02/2012 Stress echo: severe anterior wall ischemia;  b. 02/2012 Cath/PCI: LAD 95p (3.0 x 15 Xience EX DES), D1 90ost (PTCA - bifurcational dzs), EF 45% with anterior HK;  b. 02/2013 Ex MV: fixed anterior defect w/ minor reversibility, nl EF-->Med Rx.  . Endometrial cancer ([place])    a. 07/2016 s/p robotic hysterectomy, BSO w/ washings, sentinel node inj, mapping, bx, adhesiolysis.  . Essential hypertension, benign   . Fibrocystic breast disease   . GERD (gastroesophageal reflux disease)   . Gestational hypertension   . Heart murmur   . History of anemia   . History of blood transfusion   . Hodgkin's lymphoma ([place]) 2011   a. s/p radiation and chemo therapy  . Osteoarthritis   . Polycystic ovarian disease     Past Surgical History:  Procedure Laterality Date  . ABDOMINAL HYSTERECTOMY    . bladder sling    . CARDIAC CATHETERIZATION  02/2012   [facility] 1 stent place  . CERVICAL POLYPECTOMY    . CHOLECYSTECTOMY  1982  . COLONOSCOPY WITH PROPOFOL N/A 02/05/2015   Procedure: COLONOSCOPY WITH PROPOFOL;  Surgeon: Lucilla Lame, MD;  Location: [facility];  Service: Endoscopy;  Laterality: N/A;  . CORONARY ANGIOPLASTY  02/2012   left/right s/p balloon  . CYSTOGRAM N/A 08/17/2016   Procedure: CYSTOGRAM;  Surgeon: Hollice Espy, MD;  Location: [facility] ORS;  Service: Urology;  Laterality: N/A;  . CYSTOSCOPY N/A 08/17/2016   Procedure: CYSTOSCOPY EXAM UNDER  ANESTHESIA;  Surgeon: Hollice Espy, MD;  Location: [facility] ORS;  Service: Urology;  Laterality: N/A;  . CYSTOSCOPY W/ RETROGRADES Bilateral 08/17/2016   Procedure: CYSTOSCOPY WITH RETROGRADE PYELOGRAM;  Surgeon: Hollice Espy, MD;  Location: [facility] OR[facility];  Service: Urology;  Laterality: Bilateral;  . CYSTOSCOPY WITH STENT PLACEMENT Right 08/17/2016   Procedure: CYSTOSCOPY WITH STENT PLACEMENT;  Surgeon: Hollice Espy, MD;  Location: [facility] OR[facility];  Service: Urology;  Laterality: Right;  . heart stent'  2013  . kidney stent Right 2018  . LYMPH NODE BIOPSY  2011    diagnosis of hodgkins lymphoma  . PELVIC LYMPH NODE DISSECTION N/A 07/29/2016   Procedure: PELVIC/AORTIC LYMPH NODE SAMPLING;  Surgeon: [name], MD;  Location: [facility] OR[facility];  Service: Gynecology;  Laterality: N/A;  . PORTA CATH INSERTION N/A 09/22/2016   Procedure: Glori Luis Cath Insertion;  Surgeon: [name], MD;  Location: [place] [facility];  Service: Cardiovascular;  Laterality: N/A;  . PORTA CATH INSERTION N/A 10/27/2019   Procedure: PORTA CATH INSERTION;  Surgeon: [name], MD;  Location: [place] [facility];  Service: Cardiovascular;  Laterality: N/A;  . PORTA CATH REMOVAL N/A 11/17/2016   Procedure: Glori Luis Cath Removal;  Surgeon: [name], MD;  Location: [place] [facility];  Service: Cardiovascular;  Laterality: N/A;  . RIGHT/LEFT HEART CATH AND CORONARY ANGIOGRAPHY N/A 12/04/2019   Procedure: RIGHT/LEFT HEART CATH AND CORONARY ANGIOGRAPHY;  Surgeon: Wellington Hampshire, MD;  Location: [place] [facility];  Service: Cardiovascular;  Laterality: N/A;  . ROBOTIC ASSISTED TOTAL HYSTERECTOMY WITH BILATERAL SALPINGO OOPHERECTOMY N/A 07/29/2016   Procedure: ROBOTIC ASSISTED TOTAL HYSTERECTOMY WITH BILATERAL SALPINGO OOPHORECTOMY;  Surgeon: [name], MD;  Location: [facility];  Service: Gynecology;  Laterality: N/A;  . SENTINEL NODE BIOPSY N/A 07/29/2016   Procedure: SENTINEL NODE BIOPSY;  Surgeon: [name], MD;  Location: [facility];  Service: Gynecology;  Laterality: N/A;  . transobturator sling N/A 2009   [place]     Current Outpatient Medications  Medication Sig Dispense Refill  . amitriptyline (ELAVIL) 25 MG tablet Take 1 tablet (25 mg total) by mouth at bedtime. May increase weekly as needed 90 tablet 3  . apixaban (ELIQUIS) 2.5 MG TABS tablet Take 1 tablet (2.5 mg total) by mouth 2 (two) times daily. 60 tablet 1  . atorvastatin (LIPITOR) 10 MG tablet Take 1 tablet by mouth once daily 90 tablet 2  . carvedilol (COREG) 3.125 MG  tablet Take 1 tablet (3.125 mg total) by mouth 2 (two) times daily with a meal. 90 tablet 1  . cholecalciferol (VITAMIN D3) 25 MCG (1000 UNIT) tablet Take 1,000 Units by mouth in the morning and at bedtime.    . cyanocobalamin (,VITAMIN B-12,) 1000 MCG/ML injection INJECT 1ML IM WEEKLY FOR 4 WEEKS, THEN INJECT MONTHLY THEREAFTER (Patient taking differently: Inject 1,000 mcg into the skin every 30 (thirty) days. ) 10 mL 0  . furosemide (LASIX) 20 MG tablet Take 1 tablet (20 mg total) by mouth daily. 90 tablet 0  . lenvatinib 8 mg daily dose (LENVIMA, 8 MG DAILY DOSE,) 2 x 4 MG capsule Take 2 capsules (8 mg total) by mouth daily. 60 capsule 2  . levothyroxine (SYNTHROID) 75 MCG tablet Take 1 tablet (75 mcg total) by mouth daily before breakfast. 30 tablet 2  . LORazepam (ATIVAN) 0.5 MG tablet Take 1 tablet (0.5 mg total) by mouth every 6 (six) hours as needed for anxiety (and to help with breathing). 120 tablet  0  . losartan (COZAAR) 25 MG tablet Take 0.5 tablets (12.5 mg total) by mouth daily. 15 tablet 2  . Morphine Sulfate (MORPHINE CONCENTRATE) 10 mg / 0.5 ml concentrated solution Take 0.25 mLs (5 mg total) by mouth every 6 (six) hours as needed for severe pain or shortness of breath. 30 mL 0  . pantoprazole (PROTONIX) 20 MG tablet Take 1 tablet (20 mg total) by mouth daily. 60 tablet 1  . potassium chloride (KLOR-CON) 10 MEQ tablet Take 1 tablet (10 mEq total) by mouth daily. 90 tablet 1  . triamcinolone cream (KENALOG) 0.1 % Apply 1 application topically 2 (two) times daily. 28.4 g tube 30 g 0   No current facility-administered medications for this visit.   Facility-Administered Medications Ordered in Other Visits  Medication Dose Route Frequency Provider Last Rate Last Admin  . heparin lock flush 100 unit/mL  500 Units Intravenous Once Sindy Guadeloupe, MD      . sodium chloride flush (NS) 0.9 % injection 10 mL  10 mL Intravenous PRN Sindy Guadeloupe, MD   10 mL at 12/07/19 0914  . sodium  chloride flush (NS) 0.9 % injection 10 mL  10 mL Intravenous PRN Sindy Guadeloupe, MD   10 mL at 01/04/20 0850    Allergies:   Adhesive [tape], Antifungal [miconazole nitrate], Sulfa antibiotics, and Z-pak [azithromycin]    Social History:  The patient  reports that she quit smoking about 17 years ago. Her smoking use included cigarettes. She has a 30.00 pack-year smoking history. She has never used smokeless tobacco. She reports previous alcohol use. She reports that she does not use drugs.   Family History:  The patient's family history includes ALS in her father; Breast cancer in her maternal aunt; Cancer in her maternal aunt and maternal grandfather; Colon cancer in her maternal aunt; Diabetes in her brother; Non-Hodgkin's lymphoma in her cousin; Polymyositis in her father; Stroke in her maternal grandfather and maternal grandmother.    ROS:  Please see the history of present illness.   Otherwise, review of systems are positive for none.   All other systems are reviewed and negative.    PHYSICAL EXAM: VS:  BP 100/70 (BP Location: Left Arm, Patient Position: Sitting, Cuff Size: Normal)   Pulse (!) 102   Ht 5\' 1"  (1.549 m)   Wt 149 lb 8 oz (67.8 kg)   SpO2 98%   BMI 28.25 kg/m  , BMI Body mass index is 28.25 kg/m. GEN: Well nourished, well developed, in no acute distress  HEENT: normal  Neck: no JVD, carotid bruits, or masses Cardiac: RRR; no murmurs, rubs, or gallops,no edema  Respiratory:  clear to auscultation bilaterally, normal work of breathing GI: soft, nontender, nondistended, + BS MS: no deformity or atrophy  Skin: warm and dry, no rash Neuro:  Strength and sensation are intact Psych: euthymic mood, full affect   EKG:  EKG is ordered today. The ekg ordered today demonstrates sinus tachycardia with a PAC and nonspecific ST and T wave changes.   Recent Labs: 02/20/2020: B Natriuretic Peptide 3,068.3 02/21/2020: Magnesium 2.1 04/15/2020: ALT 14; BUN 19; Creatinine, Ser  1.50; Hemoglobin 9.0; Platelets 210; Potassium 3.3; Sodium 136; TSH 0.191    Lipid Panel    Component Value Date/Time   CHOL 114 12/30/2019 0457   CHOL 110 04/11/2012 0835   TRIG 131 12/30/2019 0457   HDL 47 12/30/2019 0457   HDL 56 04/11/2012 0835   CHOLHDL 2.4 12/30/2019 0457  VLDL 26 12/30/2019 0457   LDLCALC 41 12/30/2019 0457   LDLCALC 41 04/11/2012 0835      Wt Readings from Last 3 Encounters:  04/25/20 149 lb 8 oz (67.8 kg)  04/15/20 151 lb 9.6 oz (68.8 kg)  04/05/20 150 lb 4.8 oz (68.2 kg)        ASSESSMENT AND PLAN:  1.  Coronary artery disease involving native coronary arteries without angina. She is overall doing well with no anginal symptoms. Continue medical therapy.  2.  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  Volume overload improved with a small dose furosemide.  Not able to uptitrate heart failure medications due to low blood pressure.  3. Hyperlipidemia: Continue treatment with low dose atorvastatin. Most recent LDL was 46.  4.  Pulmonary embolism: Currently tolerating small dose Eliquis 2.5 mg twice daily.  5.  Endometrial cancer: Resume chemotherapy recently.  The patient reports that she might elect to stop all forms of chemotherapy if she gets recurrent side effects.   Disposition:   FU with me in 4 months  Signed,  [name], MD  04/25/2020 2:07 PM    [place]

## 2020-04-25 NOTE — Patient Instructions (Signed)
Medication Instructions:  Your physician recommends that you continue on your current medications as directed. Please refer to the Current Medication list given to you today.  *If you need a refill on your cardiac medications before your next appointment, please call your pharmacy*   Lab Work: None ordered  If you have labs (blood work) drawn today and your tests are completely normal, you will receive your results only by: MyChart Message (if you have MyChart) OR A paper copy in the mail If you have any lab test that is abnormal or we need to change your treatment, we will call you to review the results.   Testing/Procedures: None ordered   Follow-Up: At [facility], you and your health needs are our priority.  As part of our continuing mission to provide you with exceptional heart care, we have created designated Provider Care Teams.  These Care Teams include your primary Cardiologist (physician) and Advanced Practice Providers (APPs -  Physician Assistants and Nurse Practitioners) who all work together to provide you with the care you need, when you need it.  We recommend signing up for the patient portal called "MyChart".  Sign up information is provided on this After Visit Summary.  MyChart is used to connect with patients for Virtual Visits (Telemedicine).  Patients are able to view lab/test results, encounter notes, upcoming appointments, etc.  Non-urgent messages can be sent to your provider as well.   To learn more about what you can do with MyChart, go to [email or web address].    Your next appointment:   4 month(s)  The format for your next appointment:   In Person  Provider:   You may see Muhammad Arida, MD or one of the following Advanced Practice Providers on your designated Care Team:   Christopher Berge, NP Ryan Dunn, PA-C Jacquelyn Visser, PA-C Cadence Furth, PA-C   Other Instructions N/A  

## 2020-04-26 ENCOUNTER — Inpatient Hospital Stay: Payer: PPO

## 2020-04-26 ENCOUNTER — Ambulatory Visit: Payer: PPO

## 2020-04-26 ENCOUNTER — Ambulatory Visit: Payer: PPO | Admitting: Oncology

## 2020-04-26 ENCOUNTER — Encounter: Payer: Self-pay | Admitting: Oncology

## 2020-04-26 ENCOUNTER — Inpatient Hospital Stay: Payer: PPO | Admitting: Oncology

## 2020-04-26 ENCOUNTER — Other Ambulatory Visit: Payer: PPO

## 2020-04-26 VITALS — BP 122/58 | HR 99 | Temp 97.2°F | Resp 16 | Wt 149.7 lb

## 2020-04-26 DIAGNOSIS — C541 Malignant neoplasm of endometrium: Secondary | ICD-10-CM | POA: Diagnosis not present

## 2020-04-26 DIAGNOSIS — Z5112 Encounter for antineoplastic immunotherapy: Secondary | ICD-10-CM | POA: Diagnosis not present

## 2020-04-26 DIAGNOSIS — Z79899 Other long term (current) drug therapy: Secondary | ICD-10-CM

## 2020-04-26 DIAGNOSIS — D6959 Other secondary thrombocytopenia: Secondary | ICD-10-CM

## 2020-04-26 DIAGNOSIS — C786 Secondary malignant neoplasm of retroperitoneum and peritoneum: Secondary | ICD-10-CM

## 2020-04-26 LAB — CBC WITH DIFFERENTIAL/PLATELET
Abs Immature Granulocytes: 0.04 10*3/uL (ref 0.00–0.07)
Basophils Absolute: 0.1 10*3/uL (ref 0.0–0.1)
Basophils Relative: 1 %
Eosinophils Absolute: 0.5 10*3/uL (ref 0.0–0.5)
Eosinophils Relative: 6 %
HCT: 28.6 % — ABNORMAL LOW (ref 36.0–46.0)
Hemoglobin: 9.1 g/dL — ABNORMAL LOW (ref 12.0–15.0)
Immature Granulocytes: 1 %
Lymphocytes Relative: 12 %
Lymphs Abs: 1 10*3/uL (ref 0.7–4.0)
MCH: 28.9 pg (ref 26.0–34.0)
MCHC: 31.8 g/dL (ref 30.0–36.0)
MCV: 90.8 fL (ref 80.0–100.0)
Monocytes Absolute: 0.6 10*3/uL (ref 0.1–1.0)
Monocytes Relative: 7 %
Neutro Abs: 5.7 10*3/uL (ref 1.7–7.7)
Neutrophils Relative %: 73 %
Platelets: 289 10*3/uL (ref 150–400)
RBC: 3.15 MIL/uL — ABNORMAL LOW (ref 3.87–5.11)
RDW: 15.7 % — ABNORMAL HIGH (ref 11.5–15.5)
WBC: 7.8 10*3/uL (ref 4.0–10.5)
nRBC: 0 % (ref 0.0–0.2)

## 2020-04-26 LAB — COMPREHENSIVE METABOLIC PANEL
ALT: 12 U/L (ref 0–44)
AST: 23 U/L (ref 15–41)
Albumin: 3.4 g/dL — ABNORMAL LOW (ref 3.5–5.0)
Alkaline Phosphatase: 71 U/L (ref 38–126)
Anion gap: 10 (ref 5–15)
BUN: 21 mg/dL (ref 8–23)
CO2: 27 mmol/L (ref 22–32)
Calcium: 8.8 mg/dL — ABNORMAL LOW (ref 8.9–10.3)
Chloride: 102 mmol/L (ref 98–111)
Creatinine, Ser: 1.82 mg/dL — ABNORMAL HIGH (ref 0.44–1.00)
GFR, Estimated: 30 mL/min — ABNORMAL LOW (ref >60)
Glucose, Bld: 117 mg/dL — ABNORMAL HIGH (ref 70–99)
Potassium: 3.6 mmol/L (ref 3.5–5.1)
Sodium: 139 mmol/L (ref 135–145)
Total Bilirubin: 0.9 mg/dL (ref 0.3–1.2)
Total Protein: 6.4 g/dL — ABNORMAL LOW (ref 6.5–8.1)

## 2020-04-26 MED ORDER — SODIUM CHLORIDE 0.9 % IV SOLN
Freq: Once | INTRAVENOUS | Status: AC
  Filled 2020-04-26: qty 250

## 2020-04-26 MED ORDER — SODIUM CHLORIDE 0.9 % IV SOLN
200.0000 mg | Freq: Once | INTRAVENOUS | Status: AC
  Administered 2020-04-26: 200 mg via INTRAVENOUS
  Filled 2020-04-26: qty 8

## 2020-04-26 MED ORDER — HEPARIN SOD (PORK) LOCK FLUSH 100 UNIT/ML IV SOLN
INTRAVENOUS | Status: AC
  Filled 2020-04-26: qty 5

## 2020-04-26 MED ORDER — SODIUM CHLORIDE 0.9% FLUSH
10.0000 mL | Freq: Once | INTRAVENOUS | Status: AC
  Administered 2020-04-26: 10 mL via INTRAVENOUS
  Filled 2020-04-26: qty 10

## 2020-04-26 MED ORDER — HEPARIN SOD (PORK) LOCK FLUSH 100 UNIT/ML IV SOLN
500.0000 [IU] | Freq: Once | INTRAVENOUS | Status: DC | PRN
  Filled 2020-04-26: qty 5

## 2020-04-26 MED ORDER — HEPARIN SOD (PORK) LOCK FLUSH 100 UNIT/ML IV SOLN
500.0000 [IU] | Freq: Once | INTRAVENOUS | Status: AC
  Administered 2020-04-26: 500 [IU] via INTRAVENOUS
  Filled 2020-04-26: qty 5

## 2020-04-26 NOTE — Progress Notes (Signed)
Per Dr. Janese Banks patient can get Jaime Hall today with a creatinine of 1.82.  Patient tolerated treatment today without any complications.

## 2020-05-01 ENCOUNTER — Other Ambulatory Visit: Payer: Self-pay | Admitting: Oncology

## 2020-05-01 ENCOUNTER — Telehealth: Payer: Self-pay | Admitting: *Deleted

## 2020-05-01 MED ORDER — APIXABAN 2.5 MG PO TABS
2.5000 mg | ORAL_TABLET | Freq: Two times a day (BID) | ORAL | 1 refills | Status: DC
Start: 2020-05-01 — End: 2020-10-07

## 2020-05-01 NOTE — Progress Notes (Signed)
Hematology/Oncology Consult note [facility]  [phone] [phone]  Patient Care Team: [name], MD as PCP - General (Internal Medicine) Wellington Hampshire, MD as PCP - Cardiology (Cardiology) [name], MD as Consulting Physician (General Surgery) Mellody Drown, MD as Referring Physician (Obstetrics and Gynecology) [name], MD as Referring Physician (Obstetrics and Gynecology) Hollice Espy, MD as Consulting Physician (Urology) [name], RN as Registered Nurse Sindy Guadeloupe, MD as Consulting Physician (Oncology)   Name of the patient: Jaime Hall  [id number]  Nov 07, 1950   Date of visit: 05/01/20  Diagnosis- 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 She underwent robotic hysterectomy with bilateral salpingo-oophorectomy with washings and sentinel lymph node injection mapping and biopsy in February 2018. She had multiple postoperative complications and completed 3 cycles of carbotaxol chemotherapy in May 2018. Treatment was complicated by chemo-induced peripheral neuropathy for which Taxol dose was reduced by 25%. She completed 5 weeks of external beam whole pelvic radiation on August 2018.Her original tumor in 2018 was ER +1%. PR negative. MSI stable and HER-2 negative  She was under clinical surveillance and recently presented to [name] with symptoms of right lower quadrant abdominal pain which led to aCT abdomen which showed new peritoneal metastatic disease throughout the right abdomen and pelvis with index masses measuring 5.7 x 4.7 cm. Head CT scan also  showed evidence of hypermetabolic abdominal pelvic peritoneal metastases but no evidence of other sites of metastases.  Repeat omental biopsy confirms high grade serous carcinoma.Foundation 1 testing showed following alteration/biomarkers. Microsatellite status stable. EPH for be amplification, ESR 1 amplification, HNF1 A G 292 FS 25, follow-up 1 amplification, with 3 ER 1 loss, PPP 2R1 AAS 256F, PTEN loss, T p53. However none of these mutations have actionable targets.  Repeat echocardiogram showed EF of 30 to 35%. Cardiac cath unremarkable consistent with nonischemic cardiomyopathy.Therefore Doxil not chosen and she would be getting carboplatin and gemcitabine. Chemotherapy complicated by significant anemia causing supply demand cardiac ischemia and hospitalization.Patient switched to [facility] and lenvatinib starting 01/11/2020  Patient received two cycles of Keytruda but treatment washeldsince August 2021 due to repeated hospitalizations for possible heart failure followed by diagnosis of PE.   Interval history-patient reports doing well presently.  She is off oxygen and lives independently.  Her son does live close by and helps out with IADLs.  Denies any pain presently.  Appetite is stable and bowel movements are   ECOG PS- 1 Pain scale- 0 Opioid associated constipation- no  Review of systems- Review of Systems  Constitutional: Positive for malaise/fatigue. Negative for chills, fever and weight loss.  HENT: Negative for congestion, ear discharge and nosebleeds.   Eyes: Negative for blurred vision.  Respiratory: Negative for cough, hemoptysis, sputum production, shortness of breath and wheezing.   Cardiovascular: Negative for chest pain, palpitations, orthopnea and claudication.  Gastrointestinal: Negative for abdominal pain, blood in stool, constipation, diarrhea, heartburn, melena, nausea and vomiting.  Genitourinary: Negative for dysuria, flank pain, frequency, hematuria and  urgency.  Musculoskeletal: Negative for back pain, joint pain and myalgias.  Skin: Negative for rash.  Neurological: Negative for dizziness, tingling, focal weakness, seizures, weakness and headaches.  Endo/Heme/Allergies: Does  not bruise/bleed easily.  Psychiatric/Behavioral: Negative for depression and suicidal ideas. The patient does not have insomnia.       Allergies  Allergen Reactions  . Adhesive [Tape] Other (See Comments)    Skin Irritation   . Antifungal [Miconazole Nitrate] Rash  . Sulfa Antibiotics Nausea And Vomiting and Rash  . Z-Pak [Azithromycin] Itching     Past Medical History:  Diagnosis Date  . Anxiety   . Cervicalgia   . CHF (congestive heart failure) ([place])   . Chronic kidney disease   . Coronary artery disease    a. 02/2012 Stress echo: severe anterior wall ischemia;  b. 02/2012 Cath/PCI: LAD 95p (3.0 x 15 Xience EX DES), D1 90ost (PTCA - bifurcational dzs), EF 45% with anterior HK;  b. 02/2013 Ex MV: fixed anterior defect w/ minor reversibility, nl EF-->Med Rx.  . Endometrial cancer ([place])    a. 07/2016 s/p robotic hysterectomy, BSO w/ washings, sentinel node inj, mapping, bx, adhesiolysis.  . Essential hypertension, benign   . Fibrocystic breast disease   . GERD (gastroesophageal reflux disease)   . Gestational hypertension   . Heart murmur   . History of anemia   . History of blood transfusion   . Hodgkin's lymphoma ([place]) 2011   a. s/p radiation and chemo therapy  . Osteoarthritis   . Polycystic ovarian disease      Past Surgical History:  Procedure Laterality Date  . ABDOMINAL HYSTERECTOMY    . bladder sling    . CARDIAC CATHETERIZATION  02/2012   [facility] 1 stent place  . CERVICAL POLYPECTOMY    . CHOLECYSTECTOMY  1982  . COLONOSCOPY WITH PROPOFOL N/A 02/05/2015   Procedure: COLONOSCOPY WITH PROPOFOL;  Surgeon: Lucilla Lame, MD;  Location: [facility] ENDOSCOPY;  Service: Endoscopy;  Laterality: N/A;  . CORONARY ANGIOPLASTY  02/2012   left/right s/p  balloon  . CYSTOGRAM N/A 08/17/2016   Procedure: CYSTOGRAM;  Surgeon: Hollice Espy, MD;  Location: [facility] ORS;  Service: Urology;  Laterality: N/A;  . CYSTOSCOPY N/A 08/17/2016   Procedure: CYSTOSCOPY EXAM UNDER ANESTHESIA;  Surgeon: Hollice Espy, MD;  Location: [facility] ORS;  Service: Urology;  Laterality: N/A;  . CYSTOSCOPY W/ RETROGRADES Bilateral 08/17/2016   Procedure: CYSTOSCOPY WITH RETROGRADE PYELOGRAM;  Surgeon: Hollice Espy, MD;  Location: [facility] ORS;  Service: Urology;  Laterality: Bilateral;  . CYSTOSCOPY WITH STENT PLACEMENT Right 08/17/2016   Procedure: CYSTOSCOPY WITH STENT PLACEMENT;  Surgeon: Hollice Espy, MD;  Location: [facility] ORS;  Service: Urology;  Laterality: Right;  . heart stent'  2013  . kidney stent Right 2018  . LYMPH NODE BIOPSY  2011   diagnosis of hodgkins lymphoma  . PELVIC LYMPH NODE DISSECTION N/A 07/29/2016   Procedure: PELVIC/AORTIC LYMPH NODE SAMPLING;  Surgeon: [name], MD;  Location: [facility] ORS;  Service: Gynecology;  Laterality: N/A;  . PORTA CATH INSERTION N/A 09/22/2016   Procedure: Glori Luis Cath Insertion;  Surgeon: [name], MD;  Location: [place] [facility];  Service: Cardiovascular;  Laterality: N/A;  . PORTA CATH INSERTION N/A 10/27/2019   Procedure: PORTA CATH INSERTION;  Surgeon: [name], MD;  Location: [place] [facility];  Service: Cardiovascular;  Laterality: N/A;  . PORTA CATH REMOVAL N/A 11/17/2016   Procedure: Glori Luis Cath Removal;  Surgeon: [name], MD;  Location: [place] [facility];  Service: Cardiovascular;  Laterality: N/A;  . RIGHT/LEFT HEART CATH AND CORONARY ANGIOGRAPHY N/A 12/04/2019   Procedure: RIGHT/LEFT HEART CATH AND CORONARY ANGIOGRAPHY;  Surgeon: [name],  Mertie Clause, MD;  Location: [place] [facility];  Service: Cardiovascular;  Laterality: N/A;  . ROBOTIC ASSISTED TOTAL HYSTERECTOMY WITH BILATERAL SALPINGO OOPHERECTOMY N/A 07/29/2016   Procedure: ROBOTIC ASSISTED TOTAL HYSTERECTOMY WITH BILATERAL  SALPINGO OOPHORECTOMY;  Surgeon: [name], MD;  Location: [facility] ORS;  Service: Gynecology;  Laterality: N/A;  . SENTINEL NODE BIOPSY N/A 07/29/2016   Procedure: SENTINEL NODE BIOPSY;  Surgeon: [name], MD;  Location: [facility] ORS;  Service: Gynecology;  Laterality: N/A;  . transobturator sling N/A 2009   [place]    Social History   Socioeconomic History  . Marital status: Widowed    Spouse name: Not on file  . Number of children: Not on file  . Years of education: Not on file  . Highest education level: Not on file  Occupational History  . Not on file  Tobacco Use  . Smoking status: Former Smoker    Packs/day: 1.00    Years: 30.00    Pack years: 30.00    Types: Cigarettes    Quit date: 07/24/2002    Years since quitting: 17.7  . Smokeless tobacco: Never Used  . Tobacco comment: quit smoking in 2000  Vaping Use  . Vaping Use: Never used  Substance and Sexual Activity  . Alcohol use: Not Currently  . Drug use: No  . Sexual activity: Never  Other Topics Concern  . 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    Social Determinants of Health   Financial Resource Strain:   . Difficulty of Paying Living Expenses: Not on file  Food Insecurity:   . Worried About Charity fundraiser in the Last Year: Not on file  . Ran Out of Food in the Last Year: Not on file  Transportation Needs:   . Lack of Transportation (Medical): Not on file  . Lack of Transportation (Non-Medical): Not on file  Physical Activity:   . Days of Exercise per Week: Not on file  . Minutes of Exercise per Session: Not on file  Stress:   . Feeling of Stress : Not on file  Social Connections:   . Frequency of Communication with Friends and Family: Not on file  . Frequency of Social Gatherings with Friends and Family: Not on file  . Attends Religious Services: Not on file  . Active Member of Clubs or  Organizations: Not on file  . Attends Archivist Meetings: Not on file  . Marital Status: Not on file  Intimate Partner Violence:   . Fear of Current or Ex-Partner: Not on file  . Emotionally Abused: Not on file  . Physically Abused: Not on file  . Sexually Abused: Not on file    Family History  Problem Relation Age of Onset  . ALS Father   . Polymyositis Father   . Diabetes Brother   . Cancer Maternal Aunt        breast  . Breast cancer Maternal Aunt        30's  . Stroke Maternal Grandmother   . Cancer Maternal Grandfather        prostate  . Stroke Maternal Grandfather   . Colon cancer Maternal Aunt   . Non-Hodgkin's lymphoma Cousin      Current Outpatient Medications:  .  amitriptyline (ELAVIL) 25 MG tablet, Take 1 tablet (25 mg total) by mouth at bedtime. May increase weekly  as needed, Disp: 90 tablet, Rfl: 3 .  atorvastatin (LIPITOR) 10 MG tablet, Take 1 tablet by mouth once daily, Disp: 90 tablet, Rfl: 2 .  carvedilol (COREG) 3.125 MG tablet, Take 1 tablet (3.125 mg total) by mouth 2 (two) times daily with a meal., Disp: 90 tablet, Rfl: 1 .  cholecalciferol (VITAMIN D3) 25 MCG (1000 UNIT) tablet, Take 1,000 Units by mouth in the morning and at bedtime., Disp: , Rfl:  .  cyanocobalamin (,VITAMIN B-12,) 1000 MCG/ML injection, INJECT 1ML IM WEEKLY FOR 4 WEEKS, THEN INJECT MONTHLY THEREAFTER (Patient taking differently: Inject 1,000 mcg into the skin every 30 (thirty) days. ), Disp: 10 mL, Rfl: 0 .  furosemide (LASIX) 20 MG tablet, Take 1 tablet (20 mg total) by mouth daily., Disp: 90 tablet, Rfl: 0 .  lenvatinib 8 mg daily dose (LENVIMA, 8 MG DAILY DOSE,) 2 x 4 MG capsule, Take 2 capsules (8 mg total) by mouth daily., Disp: 60 capsule, Rfl: 2 .  levothyroxine (SYNTHROID) 75 MCG tablet, Take 1 tablet (75 mcg total) by mouth daily before breakfast., Disp: 30 tablet, Rfl: 2 .  LORazepam (ATIVAN) 0.5 MG tablet, Take 1 tablet (0.5 mg total) by mouth every 6 (six) hours as  needed for anxiety (and to help with breathing)., Disp: 120 tablet, Rfl: 0 .  losartan (COZAAR) 25 MG tablet, Take 0.5 tablets (12.5 mg total) by mouth daily., Disp: 15 tablet, Rfl: 2 .  Morphine Sulfate (MORPHINE CONCENTRATE) 10 mg / 0.5 ml concentrated solution, Take 0.25 mLs (5 mg total) by mouth every 6 (six) hours as needed for severe pain or shortness of breath., Disp: 30 mL, Rfl: 0 .  pantoprazole (PROTONIX) 20 MG tablet, Take 1 tablet (20 mg total) by mouth daily., Disp: 60 tablet, Rfl: 1 .  triamcinolone cream (KENALOG) 0.1 %, Apply 1 application topically 2 (two) times daily. 28.4 g tube, Disp: 30 g, Rfl: 0 .  apixaban (ELIQUIS) 2.5 MG TABS tablet, Take 1 tablet (2.5 mg total) by mouth 2 (two) times daily., Disp: 60 tablet, Rfl: 1 .  potassium chloride (KLOR-CON) 10 MEQ tablet, Take 1 tablet (10 mEq total) by mouth daily., Disp: 90 tablet, Rfl: 1 No current facility-administered medications for this visit.  Facility-Administered Medications Ordered in Other Visits:  .  heparin lock flush 100 unit/mL, 500 Units, Intravenous, Once, Randa Evens C, MD .  sodium chloride flush (NS) 0.9 % injection 10 mL, 10 mL, Intravenous, PRN, Sindy Guadeloupe, MD, 10 mL at 12/07/19 0914 .  sodium chloride flush (NS) 0.9 % injection 10 mL, 10 mL, Intravenous, PRN, Sindy Guadeloupe, MD, 10 mL at 01/04/20 0850  Physical exam:  Vitals:   04/26/20 1331  BP: (!) 122/58  Pulse: 99  Resp: 16  Temp: (!) 97.2 F (36.2 C)  TempSrc: Tympanic  SpO2: 100%  Weight: 149 lb 11.2 oz (67.9 kg)   Physical Exam Constitutional:      General: She is not in acute distress. Cardiovascular:     Rate and Rhythm: Normal rate and regular rhythm.     Heart sounds: Normal heart sounds.  Pulmonary:     Effort: Pulmonary effort is normal.     Breath sounds: Normal breath sounds.  Abdominal:     General: Bowel sounds are normal.     Palpations: Abdomen is soft.  Skin:    General: Skin is warm and dry.  Neurological:      Mental Status: She is alert and oriented to person, place,  and time.      CMP Latest Ref Rng & Units 04/26/2020  Glucose 70 - 99 mg/dL 117(H)  BUN 8 - 23 mg/dL 21  Creatinine 0.44 - 1.00 mg/dL 1.82(H)  Sodium 135 - 145 mmol/L 139  Potassium 3.5 - 5.1 mmol/L 3.6  Chloride 98 - 111 mmol/L 102  CO2 22 - 32 mmol/L 27  Calcium 8.9 - 10.3 mg/dL 8.8(L)  Total Protein 6.5 - 8.1 g/dL 6.4(L)  Total Bilirubin 0.3 - 1.2 mg/dL 0.9  Alkaline Phos 38 - 126 U/L 71  AST 15 - 41 U/L 23  ALT 0 - 44 U/L 12   CBC Latest Ref Rng & Units 04/26/2020  WBC 4.0 - 10.5 K/uL 7.8  Hemoglobin 12.0 - 15.0 g/dL 9.1(L)  Hematocrit 36 - 46 % 28.6(L)  Platelets 150 - 400 K/uL 289      Assessment and plan- Patient is a 69 y.o. female with recurrent endometrial carcinoma with peritoneal metastases here for on treatment assessment prior to cycle 3 of Keytruda  Counts okay to proceed with cycle 3 of Keytruda today.  Her hemoglobin is remaining stable around 9 and platelet counts and white count is presently normal.She does have some recent evidence of CKD with her creatinine fluctuates between 1.2-1.5.  It is a little higher at 1.8 today.  She has a baseline EF of 35% and has been admitted in the past for heart failure as well.  I will therefore hold off on giving her any IV fluids but have asked to hold off on taking her Lasix for the next 3 to 4 days and following that she will resume her Lasix at 20 mg daily.  We are also restarting her Lenvima at 8 mg daily.    After she stays on [place] for about 2 months I will plan to repeat her scans.  So far she is tolerating her treatment well over the last 6 weeks without any complications or hospitalizations.  Patient understands that if she deteriorates clinically or has significant side effects from present treatment she would be proceeding with hospice   Visit Diagnosis 1. Endometrial cancer ([place])   2. Encounter for antineoplastic immunotherapy   3. High risk  medication use      Dr. Randa Evens, MD, MPH [facility] [phone] 05/01/2020 8:33 AM

## 2020-05-01 NOTE — Telephone Encounter (Addendum)
Patient called reporting that she was to have had a prescription for reduced dose of Eliquis sent to pharmacy and they do not have it. She states that she is completely out of Eliquis and needs this ASAP

## 2020-05-01 NOTE — Telephone Encounter (Signed)
Sent to [facility] pharmacy  ?

## 2020-05-03 ENCOUNTER — Ambulatory Visit: Payer: PPO | Admitting: Family

## 2020-05-09 ENCOUNTER — Emergency Department
Admission: EM | Admit: 2020-05-09 | Discharge: 2020-05-09 | Disposition: A | Discharge status: Home / Self Care | Payer: PPO | Attending: Emergency Medicine | Admitting: Emergency Medicine

## 2020-05-09 ENCOUNTER — Other Ambulatory Visit: Payer: Self-pay

## 2020-05-09 DIAGNOSIS — Z87891 Personal history of nicotine dependence: Secondary | ICD-10-CM | POA: Diagnosis not present

## 2020-05-09 DIAGNOSIS — N1831 Chronic kidney disease, stage 3a: Secondary | ICD-10-CM | POA: Insufficient documentation

## 2020-05-09 DIAGNOSIS — I509 Heart failure, unspecified: Secondary | ICD-10-CM | POA: Insufficient documentation

## 2020-05-09 DIAGNOSIS — Z8542 Personal history of malignant neoplasm of other parts of uterus: Secondary | ICD-10-CM | POA: Insufficient documentation

## 2020-05-09 DIAGNOSIS — Z86711 Personal history of pulmonary embolism: Secondary | ICD-10-CM | POA: Insufficient documentation

## 2020-05-09 DIAGNOSIS — R002 Palpitations: Secondary | ICD-10-CM | POA: Diagnosis not present

## 2020-05-09 DIAGNOSIS — I491 Atrial premature depolarization: Secondary | ICD-10-CM | POA: Diagnosis not present

## 2020-05-09 DIAGNOSIS — I130 Hypertensive heart and chronic kidney disease with heart failure and stage 1 through stage 4 chronic kidney disease, or unspecified chronic kidney disease: Secondary | ICD-10-CM | POA: Diagnosis not present

## 2020-05-09 DIAGNOSIS — R000 Tachycardia, unspecified: Secondary | ICD-10-CM

## 2020-05-09 DIAGNOSIS — R42 Dizziness and giddiness: Secondary | ICD-10-CM | POA: Diagnosis not present

## 2020-05-09 DIAGNOSIS — Z79899 Other long term (current) drug therapy: Secondary | ICD-10-CM | POA: Diagnosis not present

## 2020-05-09 DIAGNOSIS — N390 Urinary tract infection, site not specified: Secondary | ICD-10-CM | POA: Insufficient documentation

## 2020-05-09 DIAGNOSIS — Z7901 Long term (current) use of anticoagulants: Secondary | ICD-10-CM | POA: Diagnosis not present

## 2020-05-09 DIAGNOSIS — I2510 Atherosclerotic heart disease of native coronary artery without angina pectoris: Secondary | ICD-10-CM | POA: Insufficient documentation

## 2020-05-09 DIAGNOSIS — E039 Hypothyroidism, unspecified: Secondary | ICD-10-CM | POA: Diagnosis not present

## 2020-05-09 DIAGNOSIS — I471 Supraventricular tachycardia: Secondary | ICD-10-CM | POA: Diagnosis not present

## 2020-05-09 DIAGNOSIS — R079 Chest pain, unspecified: Secondary | ICD-10-CM | POA: Insufficient documentation

## 2020-05-09 DIAGNOSIS — R0989 Other specified symptoms and signs involving the circulatory and respiratory systems: Secondary | ICD-10-CM | POA: Diagnosis not present

## 2020-05-09 LAB — CBC WITH DIFFERENTIAL/PLATELET
Abs Immature Granulocytes: 0.04 10*3/uL (ref 0.00–0.07)
Basophils Absolute: 0.1 10*3/uL (ref 0.0–0.1)
Basophils Relative: 1 %
Eosinophils Absolute: 0.5 10*3/uL (ref 0.0–0.5)
Eosinophils Relative: 5 %
HCT: 30.8 % — ABNORMAL LOW (ref 36.0–46.0)
Hemoglobin: 9.5 g/dL — ABNORMAL LOW (ref 12.0–15.0)
Immature Granulocytes: 0 %
Lymphocytes Relative: 13 %
Lymphs Abs: 1.5 10*3/uL (ref 0.7–4.0)
MCH: 28.7 pg (ref 26.0–34.0)
MCHC: 30.8 g/dL (ref 30.0–36.0)
MCV: 93.1 fL (ref 80.0–100.0)
Monocytes Absolute: 0.6 10*3/uL (ref 0.1–1.0)
Monocytes Relative: 6 %
Neutro Abs: 8.2 10*3/uL — ABNORMAL HIGH (ref 1.7–7.7)
Neutrophils Relative %: 75 %
Platelets: 247 10*3/uL (ref 150–400)
RBC: 3.31 MIL/uL — ABNORMAL LOW (ref 3.87–5.11)
RDW: 16.1 % — ABNORMAL HIGH (ref 11.5–15.5)
WBC: 10.9 10*3/uL — ABNORMAL HIGH (ref 4.0–10.5)
nRBC: 0 % (ref 0.0–0.2)

## 2020-05-09 LAB — TROPONIN I (HIGH SENSITIVITY)
Troponin I (High Sensitivity): 13 ng/L (ref <18)
Troponin I (High Sensitivity): 19 ng/L — ABNORMAL HIGH (ref <18)

## 2020-05-09 LAB — COMPREHENSIVE METABOLIC PANEL
ALT: 10 U/L (ref 0–44)
AST: 20 U/L (ref 15–41)
Albumin: 3.3 g/dL — ABNORMAL LOW (ref 3.5–5.0)
Alkaline Phosphatase: 84 U/L (ref 38–126)
Anion gap: 12 (ref 5–15)
BUN: 19 mg/dL (ref 8–23)
CO2: 24 mmol/L (ref 22–32)
Calcium: 8.6 mg/dL — ABNORMAL LOW (ref 8.9–10.3)
Chloride: 101 mmol/L (ref 98–111)
Creatinine, Ser: 1.86 mg/dL — ABNORMAL HIGH (ref 0.44–1.00)
GFR, Estimated: 29 mL/min — ABNORMAL LOW (ref >60)
Glucose, Bld: 145 mg/dL — ABNORMAL HIGH (ref 70–99)
Potassium: 3.5 mmol/L (ref 3.5–5.1)
Sodium: 137 mmol/L (ref 135–145)
Total Bilirubin: 1.3 mg/dL — ABNORMAL HIGH (ref 0.3–1.2)
Total Protein: 6.4 g/dL — ABNORMAL LOW (ref 6.5–8.1)

## 2020-05-09 LAB — URINALYSIS, ROUTINE W REFLEX MICROSCOPIC
Bilirubin Urine: NEGATIVE
Glucose, UA: NEGATIVE mg/dL
Ketones, ur: NEGATIVE mg/dL
Nitrite: NEGATIVE
Protein, ur: 30 mg/dL — AB
Specific Gravity, Urine: 1.016 (ref 1.005–1.030)
WBC, UA: >50 WBC/hpf — ABNORMAL HIGH (ref 0–5)
pH: 5 (ref 5.0–8.0)

## 2020-05-09 LAB — MAGNESIUM: Magnesium: 1.5 mg/dL — ABNORMAL LOW (ref 1.7–2.4)

## 2020-05-09 MED ORDER — SODIUM CHLORIDE 0.9 % IV BOLUS
500.0000 mL | Freq: Once | INTRAVENOUS | Status: AC
  Administered 2020-05-09: 500 mL via INTRAVENOUS

## 2020-05-09 MED ORDER — SODIUM CHLORIDE 0.9 % IV SOLN
1.0000 g | INTRAVENOUS | Status: AC
  Administered 2020-05-09: 1 g via INTRAVENOUS
  Filled 2020-05-09: qty 10

## 2020-05-09 MED ORDER — CEPHALEXIN 500 MG PO CAPS: 500.0000 mg | ORAL_CAPSULE | Freq: Two times a day (BID) | ORAL | 0 refills | Status: DC

## 2020-05-09 MED ORDER — MAGNESIUM SULFATE 2 GM/50ML IV SOLN
2.0000 g | Freq: Once | INTRAVENOUS | Status: AC
  Administered 2020-05-09: 2 g via INTRAVENOUS
  Filled 2020-05-09: qty 50

## 2020-05-09 NOTE — ED Notes (Signed)
EDP at bedside to update

## 2020-05-09 NOTE — ED Notes (Signed)
EDP at bedside  

## 2020-05-09 NOTE — ED Triage Notes (Addendum)
From home ED via [facility] due to hypotension, and feeling dizzy since approx 1030PM. 88/50 with EMS, HR 180 SVT. Given 500 cc NS and 6mg  adenosine PTA. 18G to L AC.  HR 110 ST after adenosine given. Pt denies dizziness currently and feels  "almost normal". Pt alert and oriented X4, cooperative, RR even and unlabored, color WNL. Pt in NAD. No hx of SVT.  CBG with EMS 109

## 2020-05-09 NOTE — ED Notes (Signed)
Lab called about status of troponin order; running now.

## 2020-05-09 NOTE — ED Notes (Signed)
Pt alert and oriented X 4, stable for discharge. RR even and unlabored, color WNL. Discussed discharge instructions and follow-up as directed. Discharge medications discussed if provided. Pt had opportunity to ask questions if necessary and RN to provide patient/family eduction. Pt ambulates safely.

## 2020-05-09 NOTE — Discharge Instructions (Signed)
As we discussed, your evaluation was reassuring today.  You have a urinary tract infection which could have led to your heart being a little bit more irritable, and your magnesium level was low which we repleted with IV supplement.  Based on the history described, it sounds like he may have had an episode of what is called supraventricular tachycardia (SVT).  It is not a dangerous heart rhythm, but it can feel very uncomfortable likely you describe, and it is treated with the medication given to you by EMS.  We gave you some IV fluids and watch you for more than 4 hours.  We talked to about admission to the hospital but since you are feeling well and would prefer to go home, and there is no clear reason you need to stay in the hospital, we will discharge you now but encourage you to follow-up at the next available opportunity with Dr. Janese Banks or with your primary care doctor.  If you develop any new or worsening symptoms in the meantime, please return to the emergency department.  Continue to take your regular medications as well as the full course of antibiotics prescribed today for your urinary tract infection.

## 2020-05-09 NOTE — ED Notes (Signed)
Pt calling son for ride home at this time.

## 2020-05-09 NOTE — ED Provider Notes (Signed)
[facility] Emergency Department Provider Note  ____________________________________________   First MD Initiated Contact with Patient 05/09/20 0029     (approximate)  I have reviewed the triage vital signs and the nursing notes.   HISTORY  Chief Complaint Tachycardia and Hypotension    HPI Jaime Hall is a 69 y.o. 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  Dr. Janese Banks is her oncologist.  She presents tonight by EMS  for evaluation of an acute and severe episode where she felt like he was going to pass out and felt some chest pressure and palpitations.  She was also little bit short of breath.  She reports that she was working in the kitchen with her daughter-in-law and felt acute onset of being flushed and almost passing out.  She sat down and quickly felt better.  After that point she went to the bathroom to get ready for bed and she felt some tightness in her chest and palpitations.  She was little bit dizzy as well.  She used her blood pressure cuff to check her vital signs and said that her blood pressure was low and that her heart rate was in the 180s.  She called a family member to come over to be with her and when her heart rate persisted in the 180s with the chest tightness and palpitations and dizziness, they called EMS.  EMS found that her blood pressure was 88/50 with a heart rate in the 180s and what they described as SVT on the monitor.  They gave her 500 mL of normal saline and 6 mg of adenosine at which point she converted to sinus tachycardia with a rate of about 110.  When she presented to the emergency department her heart rate was still about 110 in sinus tachycardia and she said that she felt fine with no additional symptoms.  She reports that she recently started back on some oral chemotherapy agents as per Dr. Janese Banks for her endometrial  cancer.  She has been on the medication before.  She has had an itchy rash on her legs for which Dr. Janese Banks has given her a a steroid cream but it does not seem to help.  She has not had chest pain or pressure before the episode tonight.  She has no history of SVT.  She denies shortness of breath, nausea, and vomiting.  She has had no recent fever or sore throat.  No lesions within her mouth.  Symptom onset tonight was acute and they were severe but they have resolved.  Adenosine and fluids made her better, nothing particular made her worse.        Past Medical History:  Diagnosis Date  . Anxiety   . Cervicalgia   . CHF (congestive heart failure) ([place])   . Chronic kidney disease   . Coronary artery disease    a. 02/2012 Stress echo: severe anterior wall ischemia;  b. 02/2012 Cath/PCI: LAD 95p (3.0 x 15 Xience EX DES), D1 90ost (PTCA - bifurcational dzs), EF 45% with anterior HK;  b. 02/2013 Ex MV: fixed anterior defect w/ minor reversibility, nl EF-->Med Rx.  . Endometrial cancer ([place])    a. 07/2016 s/p robotic hysterectomy, BSO w/ washings, sentinel node inj, mapping, bx, adhesiolysis.  . Essential hypertension, benign   . Fibrocystic breast disease   . GERD (gastroesophageal reflux disease)   . Gestational hypertension   . Heart murmur   .  History of anemia   . History of blood transfusion   . Hodgkin's lymphoma ([place]) 2011   a. s/p radiation and chemo therapy  . Osteoarthritis   . Polycystic ovarian disease     Patient Active Problem List   Diagnosis Date Noted  . Hospital discharge follow-up 03/02/2020  . Hemoptysis 02/20/2020  . Acute on chronic congestive heart failure ([place])   . Palliative care encounter   . Acute pulmonary embolism ([place]) 02/14/2020  . Thrombocytopenia ([place]) 02/14/2020  . Hypothyroidism 02/14/2020  . Chest pain 12/29/2019  . Leukocytosis 12/29/2019  . HTN (hypertension) 12/29/2019  . HLD (hyperlipidemia) 12/29/2019  . Elevated troponin 12/29/2019  . CKD  (chronic kidney disease), stage IIIa 12/29/2019  . Depression with anxiety 12/29/2019  . Abnormal cardiovascular stress test   . Dyspnea on exertion   . Goals of care, counseling/discussion 10/20/2019  . 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 Peritoneal metastases ([place]) 10/16/2019  . Right anterior knee pain 07/25/2019  . Educated about COVID-19 virus infection 11/15/2018  . Cramps, muscle, general 04/06/2018  . B12 deficiency anemia 07/24/2017  . Normocytic anemia 07/18/2017  . Back pain 07/18/2017  . Chemotherapy-induced peripheral neuropathy ([place]) 12/10/2016  . Hyperlipidemia 08/13/2016  . Hx of colonic polyps 08/13/2016  . Benign neoplasm of sigmoid colon 08/13/2016  . Endometrial cancer ([place]) 08/13/2016  . 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 Lymphedema 07/20/2016  . Chronic venous insufficiency 07/20/2016  . PAD (peripheral artery disease) ([place]) 07/20/2016  . Class 1 obesity due to excess calories without serious comorbidity with body mass index (BMI) of 34.0 to 34.9 in adult 05/27/2016  . Leg cramps 05/27/2016  . Bronchiectasis ([place]) 12/19/2015  . Pre-syncope 12/19/2015  . Prolapsed, uterovaginal, incomplete 06/24/2014  . Routine general medical examination at a health care facility 12/30/2012  . Obesity (BMI 30-39.9) 12/30/2012  . Hx of multiple pulmonary nodules 12/28/2012  . Plantar fasciitis of right foot 12/28/2012  . Hip pain, bilateral 09/12/2012  . History of Hodgkin's lymphoma 09/12/2012  . Coronary artery disease   . Chest pain on exertion 02/29/2012    Past Surgical History:  Procedure Laterality Date  . ABDOMINAL HYSTERECTOMY    . bladder sling    . CARDIAC CATHETERIZATION  02/2012   [facility] 1 stent place  . CERVICAL POLYPECTOMY    . CHOLECYSTECTOMY  1982  . COLONOSCOPY WITH PROPOFOL N/A 02/05/2015   Procedure: COLONOSCOPY WITH PROPOFOL;  Surgeon: Lucilla Lame, MD;  Location: [facility] ENDOSCOPY;  Service: Endoscopy;   Laterality: N/A;  . CORONARY ANGIOPLASTY  02/2012   left/right s/p balloon  . CYSTOGRAM N/A 08/17/2016   Procedure: CYSTOGRAM;  Surgeon: Hollice Espy, MD;  Location: [facility] ORS;  Service: Urology;  Laterality: N/A;  . CYSTOSCOPY N/A 08/17/2016   Procedure: CYSTOSCOPY EXAM UNDER ANESTHESIA;  Surgeon: Hollice Espy, MD;  Location: [facility] ORS;  Service: Urology;  Laterality: N/A;  . CYSTOSCOPY W/ RETROGRADES Bilateral 08/17/2016   Procedure: CYSTOSCOPY WITH RETROGRADE PYELOGRAM;  Surgeon: Hollice Espy, MD;  Location: [facility] ORS;  Service: Urology;  Laterality: Bilateral;  . CYSTOSCOPY WITH STENT PLACEMENT Right 08/17/2016   Procedure: CYSTOSCOPY WITH STENT PLACEMENT;  Surgeon: Hollice Espy, MD;  Location: [facility] ORS;  Service: Urology;  Laterality: Right;  . heart stent'  2013  . kidney stent Right 2018  . LYMPH NODE BIOPSY  2011   diagnosis of hodgkins lymphoma  . PELVIC LYMPH NODE DISSECTION N/A 07/29/2016   Procedure: PELVIC/AORTIC LYMPH NODE SAMPLING;  Surgeon:  Angeles Gaetana Michaelis, MD;  Location: [facility] ORS;  Service: Gynecology;  Laterality: N/A;  . PORTA CATH INSERTION N/A 09/22/2016   Procedure: Glori Luis Cath Insertion;  Surgeon: [name], MD;  Location: [place] [facility];  Service: Cardiovascular;  Laterality: N/A;  . PORTA CATH INSERTION N/A 10/27/2019   Procedure: PORTA CATH INSERTION;  Surgeon: [name], MD;  Location: [place] [facility];  Service: Cardiovascular;  Laterality: N/A;  . PORTA CATH REMOVAL N/A 11/17/2016   Procedure: Glori Luis Cath Removal;  Surgeon: [name], MD;  Location: [place] [facility];  Service: Cardiovascular;  Laterality: N/A;  . RIGHT/LEFT HEART CATH AND CORONARY ANGIOGRAPHY N/A 12/04/2019   Procedure: RIGHT/LEFT HEART CATH AND CORONARY ANGIOGRAPHY;  Surgeon: Wellington Hampshire, MD;  Location: [place] [facility];  Service: Cardiovascular;  Laterality: N/A;  . ROBOTIC ASSISTED TOTAL HYSTERECTOMY WITH BILATERAL SALPINGO OOPHERECTOMY N/A  07/29/2016   Procedure: ROBOTIC ASSISTED TOTAL HYSTERECTOMY WITH BILATERAL SALPINGO OOPHORECTOMY;  Surgeon: [name], MD;  Location: [facility] ORS;  Service: Gynecology;  Laterality: N/A;  . SENTINEL NODE BIOPSY N/A 07/29/2016   Procedure: SENTINEL NODE BIOPSY;  Surgeon: [name], MD;  Location: [facility] ORS;  Service: Gynecology;  Laterality: N/A;  . transobturator sling N/A 2009   [place]    Prior to Admission medications   Medication Sig Start Date End Date Taking? Authorizing Provider  amitriptyline (ELAVIL) 25 MG tablet Take 1 tablet (25 mg total) by mouth at bedtime. May increase weekly as needed 04/02/20   [name], MD  apixaban (ELIQUIS) 2.5 MG TABS tablet Take 1 tablet (2.5 mg total) by mouth 2 (two) times daily. 05/01/20   Sindy Guadeloupe, MD  atorvastatin (LIPITOR) 10 MG tablet Take 1 tablet by mouth once daily 10/23/19   Wellington Hampshire, MD  carvedilol (COREG) 3.125 MG tablet Take 1 tablet (3.125 mg total) by mouth 2 (two) times daily with a meal. 04/04/20   Loel Dubonnet, NP  cephALEXin (KEFLEX) 500 MG capsule Take 1 capsule (500 mg total) by mouth 2 (two) times daily. 05/09/20   Hinda Kehr, MD  cholecalciferol (VITAMIN D3) 25 MCG (1000 UNIT) tablet Take 1,000 Units by mouth in the morning and at bedtime.    [provider]  cyanocobalamin (,VITAMIN B-12,) 1000 MCG/ML injection INJECT 1ML IM WEEKLY FOR 4 WEEKS, THEN INJECT MONTHLY THEREAFTER Patient taking differently: Inject 1,000 mcg into the skin every 30 (thirty) days.  09/05/19   [name], MD  furosemide (LASIX) 20 MG tablet Take 1 tablet (20 mg total) by mouth daily. 04/22/20   Wellington Hampshire, MD  lenvatinib 8 mg daily dose (LENVIMA, 8 MG DAILY DOSE,) 2 x 4 MG capsule Take 2 capsules (8 mg total) by mouth daily. 04/15/20   Sindy Guadeloupe, MD  levothyroxine (SYNTHROID) 75 MCG tablet Take 1 tablet (75 mcg total) by mouth daily before breakfast. 04/05/20   Sindy Guadeloupe, MD    LORazepam (ATIVAN) 0.5 MG tablet Take 1 tablet (0.5 mg total) by mouth every 6 (six) hours as needed for anxiety (and to help with breathing). 02/01/20   Sindy Guadeloupe, MD  losartan (COZAAR) 25 MG tablet Take 0.5 tablets (12.5 mg total) by mouth daily. 02/05/20 05/05/20  Loel Dubonnet, NP  Morphine Sulfate (MORPHINE CONCENTRATE) 10 mg / 0.5 ml concentrated solution Take 0.25 mLs (5 mg total) by mouth every 6 (six) hours as needed for severe pain or shortness of breath.  02/27/20   Sindy Guadeloupe, MD  pantoprazole (PROTONIX) 20 MG tablet Take 1 tablet (20 mg total) by mouth daily. 03/18/20   Sindy Guadeloupe, MD  potassium chloride (KLOR-CON) 10 MEQ tablet Take 1 tablet (10 mEq total) by mouth daily. 02/28/20   Loel Dubonnet, NP  triamcinolone cream (KENALOG) 0.1 % Apply 1 application topically 2 (two) times daily. 28.4 g tube 04/05/20   Sindy Guadeloupe, MD  prochlorperazine (COMPAZINE) 10 MG tablet Take 1 tablet (10 mg total) by mouth every 6 (six) hours as needed (Nausea or vomiting). Patient taking differently: Take 10 mg by mouth daily as needed for nausea or vomiting.  10/25/19 02/28/20  Sindy Guadeloupe, MD    Allergies Adhesive [tape], Antifungal [miconazole nitrate], Sulfa antibiotics, and Z-pak [azithromycin]  Family History  Problem Relation Age of Onset  . ALS Father   . Polymyositis Father   . Diabetes Brother   . Cancer Maternal Aunt        breast  . Breast cancer Maternal Aunt        30's  . Stroke Maternal Grandmother   . Cancer Maternal Grandfather        prostate  . Stroke Maternal Grandfather   . Colon cancer Maternal Aunt   . Non-Hodgkin's lymphoma Cousin     Social History Social History   Tobacco Use  . Smoking status: Former Smoker    Packs/day: 1.00    Years: 30.00    Pack years: 30.00    Types: Cigarettes    Quit date: 07/24/2002    Years since quitting: 17.8  . Smokeless tobacco: Never Used  . Tobacco comment: quit smoking in 2000  Vaping Use  . Vaping  Use: Never used  Substance Use Topics  . Alcohol use: Not Currently  . Drug use: No    Review of Systems Constitutional: No fever/chills Eyes: No visual changes. ENT: No sore throat. Cardiovascular: Chest pressure and palpitations. Respiratory: Denies shortness of breath. Gastrointestinal: No abdominal pain.  No nausea, no vomiting.  No diarrhea.  No constipation. Genitourinary: Negative for dysuria. Musculoskeletal: Negative for neck pain.  Negative for back pain. Integumentary: Subacute itching rash on extremities. Neurological: Dizziness.  Negative for headaches, focal weakness or numbness.   ____________________________________________   PHYSICAL EXAM:  VITAL SIGNS: ED Triage Vitals  Enc Vitals Group     BP 05/09/20 0028 105/71     Pulse Rate 05/09/20 0026 (!) 117     Resp 05/09/20 0026 16     Temp 05/09/20 0028 98.5 F (36.9 C)     Temp Source 05/09/20 0028 Oral     SpO2 05/09/20 0026 100 %     Weight 05/09/20 0028 67 kg (147 lb 11.3 oz)     Height 05/09/20 0028 1.549 m (5\' 1" )     Head Circumference --      Peak Flow --      Pain Score 05/09/20 0028 0     Pain Loc --      Pain Edu? --      Excl. in [place]? --     Constitutional: Alert and oriented.  Eyes: Conjunctivae are normal.  Head: Atraumatic. Nose: No congestion/rhinnorhea. Mouth/Throat: Patient is wearing a mask. Neck: No stridor.  No meningeal signs.   Cardiovascular: Mild tachycardia, regular rhythm. Good peripheral circulation. Grossly normal heart sounds. Respiratory: Normal respiratory effort.  No retractions. Gastrointestinal: Soft and nontender. No distention.  Musculoskeletal: No lower extremity tenderness nor edema. No  gross deformities of extremities. Neurologic:  Normal speech and language. No gross focal neurologic deficits are appreciated.  Skin:  Skin is warm, dry and intact. Psychiatric: Mood and affect are normal. Speech and behavior are  normal.  ____________________________________________   LABS (all labs ordered are listed, but only abnormal results are displayed)  Labs Reviewed  CBC WITH DIFFERENTIAL/PLATELET - Abnormal; Notable for the following components:      Result Value   WBC 10.9 (*)    RBC 3.31 (*)    Hemoglobin 9.5 (*)    HCT 30.8 (*)    RDW 16.1 (*)    Neutro Abs 8.2 (*)    All other components within normal limits  MAGNESIUM - Abnormal; Notable for the following components:   Magnesium 1.5 (*)    All other components within normal limits  COMPREHENSIVE METABOLIC PANEL - Abnormal; Notable for the following components:   Glucose, Bld 145 (*)    Creatinine, Ser 1.86 (*)    Calcium 8.6 (*)    Total Protein 6.4 (*)    Albumin 3.3 (*)    Total Bilirubin 1.3 (*)    GFR, Estimated 29 (*)    All other components within normal limits  URINALYSIS, ROUTINE W REFLEX MICROSCOPIC - Abnormal; Notable for the following components:   Color, Urine YELLOW (*)    APPearance CLOUDY (*)    Hgb urine dipstick SMALL (*)    Protein, ur 30 (*)    Leukocytes,Ua LARGE (*)    WBC, UA >50 (*)    Bacteria, UA RARE (*)    Non Squamous Epithelial PRESENT (*)    All other components within normal limits  TROPONIN I (HIGH SENSITIVITY) - Abnormal; Notable for the following components:   Troponin I (High Sensitivity) 19 (*)    All other components within normal limits  URINE CULTURE  TROPONIN I (HIGH SENSITIVITY)   ____________________________________________  EKG  ED ECG REPORT I, Hinda Kehr, the attending physician, personally viewed and interpreted this ECG.  Date: 05/09/2020 EKG Time: 00: 28 Rate: 110 Rhythm: Sinus tachycardia with atrial premature complexes QRS Axis: normal Intervals: Incomplete left bundle branch block ST/T Wave abnormalities: Non-specific ST segment / T-wave changes, but no clear evidence of acute ischemia. Narrative Interpretation: no definitive evidence of acute ischemia; does not meet  STEMI criteria.  There are changes in morphology compared to prior EKGs on record but based on her prior EKGs, there is quite a bit of dynamic variation between EKGs.   ____________________________________________  RADIOLOGY  No indication for emergent imaging  ____________________________________________   PROCEDURES   Procedure(s) performed (including Critical Care):  Procedures   ____________________________________________   INITIAL IMPRESSION / MDM / [place] / ED COURSE  As part of my medical decision making, I reviewed the following data within the [facility] notes reviewed and incorporated, Labs reviewed , EKG interpreted , Old EKG reviewed, Old chart reviewed and Notes from prior ED visits   Differential diagnosis includes, but is not limited to, SVT, A. fib with RVR, other nonspecific cardiac arrhythmia, ACS, PE, electrolyte or metabolic abnormality.  The patient is on the cardiac monitor to evaluate for evidence of arrhythmia and/or significant heart rate changes.  Vital signs are as stable but slightly abnormal with a low but essentially normal blood pressure with a systolic around 081 (after a fluid bolus by EMS) and a mild sinus tachycardia at about 110.  Based on her description I suspect she was in  SVT, particular given the fact that she responded to adenosine.  However she has no history of SVT.  Given her comorbidities I will evaluate with lab work and we will monitor her carefully.  She is currently asymptomatic.  PE is also possible given her cancer diagnosis, but she is on Eliquis and she is not hypoxemic nor having chest pain at this time.  Given her chronic kidney disease I do not believe she would benefit from a CTA at this time.     Clinical Course as of May 09 454  Thu May 09, 2020  0153 Comprehensive metabolic panel is essentially stable from her last lab work from about 2 weeks ago with a creatinine of 1.86 giving  her a GFR of 29.  She would not be a good candidate for CTA chest.  Her urinalysis is grossly positive with greater than 50 white blood cells and large leukocytes.  I ordered a urine culture.  High-sensitivity troponin is pending.  Magnesium level is low at 1.5 and I ordered 2 g of IV magnesium for repletion.  She is receiving a second 500 mL fluid bolus (the first 1 given by EMS) but we will not be likely to give her additional fluids given that she has an ejection fraction of less than 30% as per the patient's oral report and confirmed in her record.  She remains asymptomatic at this time.   [CF]  0154 Ordering ceftriaxone 1 g IV for the +UTI.   [CF]  8099 Very slight elevation but not concerning for NSTEMI at the moment.  Will repeat.  Troponin I (High Sensitivity): 13 [CF]  0249 Patient ambulated down the hall and back steadily and without any difficulty with a systolic blood pressure of about 102 and a heart rate in the 90s.  She says she felt fine throughout and was not short of breath nor feeling any palpitations nor lightheadedness.  Discharged as per plan with strict return precautions.   [CF]  [id number] Patient has been stable for more than 4 hours.  Heart rate is coming down to the low 90s.  She has been resting and asymptomatic.I had a long talk with her about her lab results, symptoms, urinary tract infection, etc.  I offered admission but also explained I do not think it is absolutely necessary that she stay in the hospital given that there is no clear evidence of an acute or emergent condition.  She says she would much rather go home and spend Thanksgiving with her family.  The current plan is for her nurse to help her get up and move around, make sure she can walk and that her vital signs do not change substantially when she is up and around.  If she is stable and feeling well and asymptomatic, I will discharge her with a prescription for antibiotics and close follow-up with her oncologist.  I  also gave my usual and customary return precautions and she understands and agrees with the plan.   [CF]    Clinical Course User Index [CF] Hinda Kehr, MD     ____________________________________________  FINAL CLINICAL IMPRESSION(S) / ED DIAGNOSES  Final diagnoses:  Tachycardia  Urinary tract infection without hematuria, site unspecified     MEDICATIONS GIVEN DURING THIS VISIT:  Medications  sodium chloride 0.9 % bolus 500 mL (0 mLs Intravenous Stopped 05/09/20 0127)  magnesium sulfate IVPB 2 g 50 mL (0 g Intravenous Stopped 05/09/20 0236)  cefTRIAXone (ROCEPHIN) 1 g in sodium chloride 0.9 %  100 mL IVPB (0 g Intravenous Stopped 05/09/20 0335)     ED Discharge Orders         Ordered    cephALEXin (KEFLEX) 500 MG capsule  2 times daily        05/09/20 0449          *Please note:  Jaime Hall was evaluated in Emergency Department on 05/09/2020 for the symptoms described in the history of present illness. She was evaluated in the context of the global COVID-19 pandemic, which necessitated consideration that the patient might be at risk for infection with the SARS-CoV-2 virus that causes COVID-19. Institutional protocols and algorithms that pertain to the evaluation of patients at risk for COVID-19 are in a state of rapid change based on information released by regulatory bodies including the CDC and federal and state organizations. These policies and algorithms were followed during the patient's care in the ED.  Some ED evaluations and interventions may be delayed as a result of limited staffing during and after the pandemic.*  Note:  This document was prepared using Dragon voice recognition software and may include unintentional dictation errors.   Hinda Kehr, MD 05/09/20 [phone]

## 2020-05-09 NOTE — ED Notes (Signed)
Pt ambulated around ER with assistance of RN on monitor. States she feels like, no dizziness. Ambulates safely.

## 2020-05-10 LAB — URINE CULTURE: Special Requests: NORMAL

## 2020-05-13 NOTE — Progress Notes (Signed)
Patient ID: Jaime Hall, female    DOB: 1950/08/14, 69 y.o.   MRN: [id number]  HPI  Jaime Hall is a 69 yr old female with a history of CAD, HTN, CKD, polycystic ovarian disease, anxiety, GERD, Hodgkin's lymphoma, endometrial cancer, previous tobacco use and chronic heart failure.   Echo report from 12/29/19 reviewed and showed an EF of 30-35% along with mild MR.   Catheterization done 12/04/19 showed:  There is moderate to severe left ventricular systolic dysfunction.  LV end diastolic pressure is moderately elevated.  Previously placed Prox LAD stent (unknown type) is widely patent.  Ost LAD to Prox LAD lesion is 40% stenosed.   1.  Patent LAD stent with no significant restenosis.  There is moderate stenosis in ostial LAD before the stent.  No evidence of obstructive disease overall. 2.  Moderately to severely reduced LV systolic function with an EF of 30 to 35%. 3.  Right heart catheterization showed mildly elevated filling pressures with pulmonary capillary wedge pressure of 18 mmHg, mild to moderate pulmonary hypertension at 44/19 with a mean of 33 mmHg, and mildly reduced cardiac output at 3.89 with a cardiac index of 2.24.  Pulmonary vascular resistance is 3.8 [place] units  Was in the ED 05/09/20 due to chest pressure and palpitations along with mild shortness of breath. NS and adenosine given for SVT with HR of 180. HR decreased to 110. IV magnesium given for hypomagnesium. IV antibiotic given for UTI. HR continued to decrease and she was released. Admitted twice September 2021. Admitted 01/26/20 due to fast heart beat and rapid breathing at hospital in [facility]. Per patient, echo, CT and labs done and she was released. Admitted 12/29/19 due to chest pain that woke her from her sleep. Cardiology consult obtained and chest pain had resolved. Elevated troponin thought to be due to demand ischemia or transient coronary vasospasm. Discharged the following day.   She presents today for a  follow-up visit with a chief complaint of minimal fatigue upon moderate exertion. She describes this as chronic in nature having been present for several years although she says that her energy level has improved since she was last here. She has associated cough, shortness of breath, abdominal pain and difficulty sleeping along with this. She denies any dizziness, abdominal distention, palpitations, pedal edema, chest pain or weight gain.   Says that since her recent ED visit with SVT where she was given adenosine, she has felt "much better". Is now taking her furosemide as needed based on weight gain, swelling or shortness of breath and says that she hasn't had to take it recently.   Past Medical History:  Diagnosis Date   Anxiety    Cervicalgia    CHF (congestive heart failure) ([place])    Chronic kidney disease    Coronary artery disease    a. 02/2012 Stress echo: severe anterior wall ischemia;  b. 02/2012 Cath/PCI: LAD 95p (3.0 x 15 Xience EX DES), D1 90ost (PTCA - bifurcational dzs), EF 45% with anterior HK;  b. 02/2013 Ex MV: fixed anterior defect w/ minor reversibility, nl EF-->Med Rx.   Endometrial cancer ([place])    a. 07/2016 s/p robotic hysterectomy, BSO w/ washings, sentinel node inj, mapping, bx, adhesiolysis.   Essential hypertension, benign    Fibrocystic breast disease    GERD (gastroesophageal reflux disease)    Gestational hypertension    Heart murmur    History of anemia    History of blood transfusion    Hodgkin's  lymphoma ([place]) 2011   a. s/p radiation and chemo therapy   Osteoarthritis    Polycystic ovarian disease    Past Surgical History:  Procedure Laterality Date   ABDOMINAL HYSTERECTOMY     bladder sling     CARDIAC CATHETERIZATION  02/2012   [facility] 1 stent place   CERVICAL POLYPECTOMY     CHOLECYSTECTOMY  1982   COLONOSCOPY WITH PROPOFOL N/A 02/05/2015   Procedure: COLONOSCOPY WITH PROPOFOL;  Surgeon: Lucilla Lame, MD;  Location: [facility] ENDOSCOPY;   Service: Endoscopy;  Laterality: N/A;   CORONARY ANGIOPLASTY  02/2012   left/right s/p balloon   CYSTOGRAM N/A 08/17/2016   Procedure: CYSTOGRAM;  Surgeon: Hollice Espy, MD;  Location: [facility] ORS;  Service: Urology;  Laterality: N/A;   CYSTOSCOPY N/A 08/17/2016   Procedure: CYSTOSCOPY EXAM UNDER ANESTHESIA;  Surgeon: Hollice Espy, MD;  Location: [facility];  Service: Urology;  Laterality: N/A;   CYSTOSCOPY W/ RETROGRADES Bilateral 08/17/2016   Procedure: CYSTOSCOPY WITH RETROGRADE PYELOGRAM;  Surgeon: Hollice Espy, MD;  Location: [facility];  Service: Urology;  Laterality: Bilateral;   CYSTOSCOPY WITH STENT PLACEMENT Right 08/17/2016   Procedure: CYSTOSCOPY WITH STENT PLACEMENT;  Surgeon: Hollice Espy, MD;  Location: [facility];  Service: Urology;  Laterality: Right;   heart stent'  2013   kidney stent Right 2018   LYMPH NODE BIOPSY  2011   diagnosis of hodgkins lymphoma   PELVIC LYMPH NODE DISSECTION N/A 07/29/2016   Procedure: PELVIC/AORTIC LYMPH NODE SAMPLING;  Surgeon: [name], MD;  Location: [facility];  Service: Gynecology;  Laterality: N/A;   PORTA CATH INSERTION N/A 09/22/2016   Procedure: Glori Luis Cath Insertion;  Surgeon: [name], MD;  Location: [place] [facility];  Service: Cardiovascular;  Laterality: N/A;   PORTA CATH INSERTION N/A 10/27/2019   Procedure: PORTA CATH INSERTION;  Surgeon: [name], MD;  Location: [place] [facility];  Service: Cardiovascular;  Laterality: N/A;   PORTA CATH REMOVAL N/A 11/17/2016   Procedure: Glori Luis Cath Removal;  Surgeon: [name], MD;  Location: [place] [facility];  Service: Cardiovascular;  Laterality: N/A;   RIGHT/LEFT HEART CATH AND CORONARY ANGIOGRAPHY N/A 12/04/2019   Procedure: RIGHT/LEFT HEART CATH AND CORONARY ANGIOGRAPHY;  Surgeon: Wellington Hampshire, MD;  Location: [place] [facility];  Service: Cardiovascular;  Laterality: N/A;   ROBOTIC ASSISTED TOTAL HYSTERECTOMY WITH BILATERAL SALPINGO  OOPHERECTOMY N/A 07/29/2016   Procedure: ROBOTIC ASSISTED TOTAL HYSTERECTOMY WITH BILATERAL SALPINGO OOPHORECTOMY;  Surgeon: [name], MD;  Location: [facility];  Service: Gynecology;  Laterality: N/A;   SENTINEL NODE BIOPSY N/A 07/29/2016   Procedure: SENTINEL NODE BIOPSY;  Surgeon: [name], MD;  Location: [facility];  Service: Gynecology;  Laterality: N/A;   transobturator sling N/A 2009   [place]   Family History  Problem Relation Age of Onset   ALS Father    Polymyositis Father    Diabetes Brother    Cancer Maternal Aunt        breast   Breast cancer Maternal Aunt        30's   Stroke Maternal Grandmother    Cancer Maternal Grandfather        prostate   Stroke Maternal Grandfather    Colon cancer Maternal Aunt    Non-Hodgkin's lymphoma Cousin    Social History   Tobacco Use   Smoking status: Former Smoker    Packs/day: 1.00    Years: 30.00    Pack years: 30.00  Types: Cigarettes    Quit date: 07/24/2002    Years since quitting: 17.8   Smokeless tobacco: Never Used   Tobacco comment: quit smoking in 2000  Substance Use Topics   Alcohol use: Not Currently   Allergies  Allergen Reactions   Adhesive [Tape] Other (See Comments)    Skin Irritation    Antifungal [Miconazole Nitrate] Rash   Sulfa Antibiotics Nausea And Vomiting and Rash   Z-Pak [Azithromycin] Itching   Prior to Admission medications   Medication Sig Start Date End Date Taking? Authorizing Provider  amitriptyline (ELAVIL) 25 MG tablet Take 1 tablet (25 mg total) by mouth at bedtime. May increase weekly as needed 04/02/20  Yes [name], MD  apixaban (ELIQUIS) 2.5 MG TABS tablet Take 1 tablet (2.5 mg total) by mouth 2 (two) times daily. 05/01/20  Yes Sindy Guadeloupe, MD  atorvastatin (LIPITOR) 10 MG tablet Take 1 tablet by mouth once daily 10/23/19  Yes Wellington Hampshire, MD  carvedilol (COREG) 3.125 MG tablet Take 1 tablet (3.125 mg total) by mouth 2  (two) times daily with a meal. 04/04/20  Yes Loel Dubonnet, NP  cephALEXin (KEFLEX) 500 MG capsule Take 1 capsule (500 mg total) by mouth 2 (two) times daily. 05/09/20  Yes Hinda Kehr, MD  cholecalciferol (VITAMIN D3) 25 MCG (1000 UNIT) tablet Take 1,000 Units by mouth in the morning and at bedtime.   Yes [provider]  cyanocobalamin (,VITAMIN B-12,) 1000 MCG/ML injection INJECT 1ML IM WEEKLY FOR 4 WEEKS, THEN INJECT MONTHLY THEREAFTER Patient taking differently: Inject 1,000 mcg into the skin every 30 (thirty) days.  09/05/19  Yes [name], MD  furosemide (LASIX) 20 MG tablet Take 1 tablet (20 mg total) by mouth daily. Patient taking differently: Take 20 mg by mouth as needed.  04/22/20  Yes Wellington Hampshire, MD  lenvatinib 8 mg daily dose (LENVIMA, 8 MG DAILY DOSE,) 2 x 4 MG capsule Take 2 capsules (8 mg total) by mouth daily. 04/15/20  Yes Sindy Guadeloupe, MD  levothyroxine (SYNTHROID) 75 MCG tablet Take 1 tablet (75 mcg total) by mouth daily before breakfast. 04/05/20  Yes Sindy Guadeloupe, MD  LORazepam (ATIVAN) 0.5 MG tablet Take 1 tablet (0.5 mg total) by mouth every 6 (six) hours as needed for anxiety (and to help with breathing). 02/01/20  Yes Sindy Guadeloupe, MD  Morphine Sulfate (MORPHINE CONCENTRATE) 10 mg / 0.5 ml concentrated solution Take 0.25 mLs (5 mg total) by mouth every 6 (six) hours as needed for severe pain or shortness of breath. 02/27/20  Yes Sindy Guadeloupe, MD  pantoprazole (PROTONIX) 20 MG tablet Take 1 tablet (20 mg total) by mouth daily. 03/18/20  Yes Sindy Guadeloupe, MD  potassium chloride (KLOR-CON) 10 MEQ tablet Take 1 tablet (10 mEq total) by mouth daily. 02/28/20  Yes Loel Dubonnet, NP  triamcinolone cream (KENALOG) 0.1 % Apply 1 application topically 2 (two) times daily. 28.4 g tube 04/05/20  Yes Sindy Guadeloupe, MD  losartan (COZAAR) 25 MG tablet Take 0.5 tablets (12.5 mg total) by mouth daily. 02/05/20 05/05/20  Loel Dubonnet, NP   prochlorperazine (COMPAZINE) 10 MG tablet Take 1 tablet (10 mg total) by mouth every 6 (six) hours as needed (Nausea or vomiting). Patient taking differently: Take 10 mg by mouth daily as needed for nausea or vomiting.  10/25/19 02/28/20  Sindy Guadeloupe, MD    Review of Systems  Constitutional: Positive for fatigue (improving).  Negative for appetite change.  HENT: Negative for congestion, postnasal drip and sore throat.   Eyes: Negative.   Respiratory: Positive for cough (when laying down or when short of breath) and shortness of breath (better). Negative for chest tightness.   Cardiovascular: Negative for chest pain, palpitations and leg swelling.  Gastrointestinal: Positive for abdominal pain (right sided). Negative for abdominal distention.  Endocrine: Negative.   Genitourinary: Negative.   Musculoskeletal: Negative for back pain and neck pain.  Skin: Negative.   Allergic/Immunologic: Negative.   Neurological: Negative for dizziness and light-headedness.       Off balance  Hematological: Negative for adenopathy. Does not bruise/bleed easily.  Psychiatric/Behavioral: Positive for sleep disturbance (chronic difficulty; sleeping on 1 pillow). Negative for dysphoric mood. The patient is not nervous/anxious.     Vitals:   05/14/20 1357  BP: (!) 113/58  Pulse: (!) 108  Resp: 18  SpO2: 94%  Weight: 149 lb 4 oz (67.7 kg)  Height: 5\' 1"  (1.549 m)   Wt Readings from Last 3 Encounters:  05/14/20 149 lb 4 oz (67.7 kg)  05/09/20 147 lb 11.3 oz (67 kg)  04/26/20 149 lb 11.2 oz (67.9 kg)   Lab Results  Component Value Date   CREATININE 1.86 (H) 05/09/2020   CREATININE 1.82 (H) 04/26/2020   CREATININE 1.50 (H) 04/15/2020    Physical Exam Vitals and nursing note reviewed.  Constitutional:      Appearance: She is well-developed.  HENT:     Head: Normocephalic and atraumatic.  Neck:     Vascular: No JVD.  Cardiovascular:     Rate and Rhythm: Normal rate and regular rhythm.   Pulmonary:     Effort: Pulmonary effort is normal. No respiratory distress.     Breath sounds: No wheezing or rales.  Musculoskeletal:     Cervical back: Neck supple.     Right lower leg: No tenderness. No edema.     Left lower leg: No tenderness. No edema.  Skin:    General: Skin is warm and dry.  Neurological:     General: No focal deficit present.     Mental Status: She is alert and oriented to person, place, and time.  Psychiatric:        Mood and Affect: Mood normal.        Behavior: Behavior normal.     Assessment & Plan:  1: Chronic heart failure with reduced ejection fraction- - NYHA class II - euvolemic today - weighing daily; reminded to call for an overnight weight gain of >2 pounds or a weekly weight gain of >5 pounds - weight down 5 pounds from last visit 3 months ago - using furosemide as needed based on above weight gain, swelling or shortness of breath; says that she hasn't taken it recently - does add "some" salt but says that it's very little; doesn't have much of an appetite anyways - saw cardiology [name]) 04/25/20 - BNP 02/20/20 was 3068.3 - reports receiving both COVID vaccines - doubtful BP could tolerate entresto   2: HTN- - BP looks good today - saw PCP Derrel Nip) 04/02/20  3: CKD- - BMP 05/09/20 reviewed and showed sodium 137, potassium 3.5, creatinine 1.86 and GFR 29  4: Hodgkin lymphoma with subsequent endometrial cancer- - saw oncologist Janese Banks) 04/26/20 - had video visit with palliative care ([facility]) 04/22/20   Patient did not bring her medications nor a list. Each medication was verbally reviewed with the patient and she was encouraged to bring the  bottles to every visit to confirm accuracy of list.  Due to HF stability and her numerous oncology appointments, will not make a return appointment for patient at this time. Advised patient that she could call back at anytime to schedule another appointment and she was comfortable with this plan.

## 2020-05-14 ENCOUNTER — Other Ambulatory Visit: Payer: Self-pay

## 2020-05-14 ENCOUNTER — Encounter: Payer: Self-pay | Admitting: Family

## 2020-05-14 ENCOUNTER — Ambulatory Visit: Payer: PPO | Attending: Family | Admitting: Family

## 2020-05-14 VITALS — BP 113/58 | HR 108 | Resp 18 | Ht 61.0 in | Wt 149.2 lb

## 2020-05-14 DIAGNOSIS — I130 Hypertensive heart and chronic kidney disease with heart failure and stage 1 through stage 4 chronic kidney disease, or unspecified chronic kidney disease: Secondary | ICD-10-CM | POA: Insufficient documentation

## 2020-05-14 DIAGNOSIS — N189 Chronic kidney disease, unspecified: Secondary | ICD-10-CM | POA: Insufficient documentation

## 2020-05-14 DIAGNOSIS — C541 Malignant neoplasm of endometrium: Secondary | ICD-10-CM | POA: Diagnosis not present

## 2020-05-14 DIAGNOSIS — Z8759 Personal history of other complications of pregnancy, childbirth and the puerperium: Secondary | ICD-10-CM | POA: Insufficient documentation

## 2020-05-14 DIAGNOSIS — Z8542 Personal history of malignant neoplasm of other parts of uterus: Secondary | ICD-10-CM | POA: Diagnosis not present

## 2020-05-14 DIAGNOSIS — I5022 Chronic systolic (congestive) heart failure: Secondary | ICD-10-CM | POA: Insufficient documentation

## 2020-05-14 DIAGNOSIS — F419 Anxiety disorder, unspecified: Secondary | ICD-10-CM | POA: Insufficient documentation

## 2020-05-14 DIAGNOSIS — R059 Cough, unspecified: Secondary | ICD-10-CM | POA: Insufficient documentation

## 2020-05-14 DIAGNOSIS — R109 Unspecified abdominal pain: Secondary | ICD-10-CM | POA: Insufficient documentation

## 2020-05-14 DIAGNOSIS — I272 Pulmonary hypertension, unspecified: Secondary | ICD-10-CM | POA: Diagnosis not present

## 2020-05-14 DIAGNOSIS — N1832 Chronic kidney disease, stage 3b: Secondary | ICD-10-CM

## 2020-05-14 DIAGNOSIS — I2510 Atherosclerotic heart disease of native coronary artery without angina pectoris: Secondary | ICD-10-CM | POA: Diagnosis not present

## 2020-05-14 DIAGNOSIS — C8190 Hodgkin lymphoma, unspecified, unspecified site: Secondary | ICD-10-CM | POA: Insufficient documentation

## 2020-05-14 DIAGNOSIS — Z7901 Long term (current) use of anticoagulants: Secondary | ICD-10-CM | POA: Diagnosis not present

## 2020-05-14 DIAGNOSIS — I10 Essential (primary) hypertension: Secondary | ICD-10-CM

## 2020-05-14 DIAGNOSIS — I471 Supraventricular tachycardia: Secondary | ICD-10-CM | POA: Diagnosis not present

## 2020-05-14 DIAGNOSIS — R0602 Shortness of breath: Secondary | ICD-10-CM | POA: Insufficient documentation

## 2020-05-14 DIAGNOSIS — Z87891 Personal history of nicotine dependence: Secondary | ICD-10-CM | POA: Insufficient documentation

## 2020-05-14 DIAGNOSIS — K219 Gastro-esophageal reflux disease without esophagitis: Secondary | ICD-10-CM | POA: Diagnosis not present

## 2020-05-14 DIAGNOSIS — Z79899 Other long term (current) drug therapy: Secondary | ICD-10-CM | POA: Insufficient documentation

## 2020-05-14 NOTE — Patient Instructions (Addendum)
Continue weighing daily and call for an overnight weight gain of > 2 pounds or a weekly weight gain of >5 pounds.   Call us in the future if you'd like to schedule another appointment 

## 2020-05-15 ENCOUNTER — Inpatient Hospital Stay: Payer: PPO | Attending: Obstetrics and Gynecology | Admitting: Obstetrics and Gynecology

## 2020-05-15 ENCOUNTER — Telehealth: Payer: Self-pay | Admitting: Obstetrics and Gynecology

## 2020-05-15 VITALS — BP 95/51 | HR 88 | Temp 98.0°F | Resp 20 | Wt 150.7 lb

## 2020-05-15 DIAGNOSIS — I428 Other cardiomyopathies: Secondary | ICD-10-CM | POA: Diagnosis not present

## 2020-05-15 DIAGNOSIS — C786 Secondary malignant neoplasm of retroperitoneum and peritoneum: Secondary | ICD-10-CM | POA: Insufficient documentation

## 2020-05-15 DIAGNOSIS — I130 Hypertensive heart and chronic kidney disease with heart failure and stage 1 through stage 4 chronic kidney disease, or unspecified chronic kidney disease: Secondary | ICD-10-CM | POA: Insufficient documentation

## 2020-05-15 DIAGNOSIS — N189 Chronic kidney disease, unspecified: Secondary | ICD-10-CM

## 2020-05-15 DIAGNOSIS — E039 Hypothyroidism, unspecified: Secondary | ICD-10-CM | POA: Diagnosis not present

## 2020-05-15 DIAGNOSIS — C541 Malignant neoplasm of endometrium: Secondary | ICD-10-CM | POA: Diagnosis not present

## 2020-05-15 DIAGNOSIS — Z955 Presence of coronary angioplasty implant and graft: Secondary | ICD-10-CM | POA: Insufficient documentation

## 2020-05-15 DIAGNOSIS — Z90710 Acquired absence of both cervix and uterus: Secondary | ICD-10-CM | POA: Insufficient documentation

## 2020-05-15 DIAGNOSIS — Z90722 Acquired absence of ovaries, bilateral: Secondary | ICD-10-CM | POA: Insufficient documentation

## 2020-05-15 DIAGNOSIS — I872 Venous insufficiency (chronic) (peripheral): Secondary | ICD-10-CM | POA: Insufficient documentation

## 2020-05-15 DIAGNOSIS — Z8571 Personal history of Hodgkin lymphoma: Secondary | ICD-10-CM | POA: Insufficient documentation

## 2020-05-15 DIAGNOSIS — Z87891 Personal history of nicotine dependence: Secondary | ICD-10-CM | POA: Insufficient documentation

## 2020-05-15 DIAGNOSIS — E8809 Other disorders of plasma-protein metabolism, not elsewhere classified: Secondary | ICD-10-CM | POA: Insufficient documentation

## 2020-05-15 DIAGNOSIS — Z7901 Long term (current) use of anticoagulants: Secondary | ICD-10-CM | POA: Insufficient documentation

## 2020-05-15 DIAGNOSIS — Z79899 Other long term (current) drug therapy: Secondary | ICD-10-CM | POA: Diagnosis not present

## 2020-05-15 DIAGNOSIS — I2510 Atherosclerotic heart disease of native coronary artery without angina pectoris: Secondary | ICD-10-CM | POA: Diagnosis not present

## 2020-05-15 DIAGNOSIS — E785 Hyperlipidemia, unspecified: Secondary | ICD-10-CM | POA: Insufficient documentation

## 2020-05-15 DIAGNOSIS — I739 Peripheral vascular disease, unspecified: Secondary | ICD-10-CM | POA: Insufficient documentation

## 2020-05-15 DIAGNOSIS — I509 Heart failure, unspecified: Secondary | ICD-10-CM | POA: Diagnosis not present

## 2020-05-15 DIAGNOSIS — N898 Other specified noninflammatory disorders of vagina: Secondary | ICD-10-CM

## 2020-05-15 NOTE — Telephone Encounter (Signed)
Called pt to give appointment details. Spoke with patient.

## 2020-05-15 NOTE — Progress Notes (Signed)
Gynecologic Oncology Interval Visit   Referring Provider: Dr. 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 [place] female, initially seen in consultation from [name] and Garwin Brothers for endometrial cancer, s/p RA- hysterectomy, BSO, SN mapping and biopsy, pelvic adhesiolysis 2/18 and 3 cycles carbo-taxol and WPRT, with recurrent disease confirmed by biopsy s/p carboplatin/gemcitabine and currently on pembrolizumab/lenvatinib (currently on levatinib hold).  She returns to clinic today for follow-up pelvic exam. She was recently in the ER on 05/09/2020 and evaluated for tachycardia and hypotension. NS and adenosine given for SVT with HR of 180. HR decreased to 110. IV magnesium given for hypomagnesium. IV antibiotic given for UTI. HR continued to decrease and she was released. Her levatinib was held and she has not restarted.   She is scheduled to see Dr. Janese Banks on 05/17/2020 for her next dose of pembrolizumab.   She complains of new onset head and right ear pain; right lower quadrant pain, vaginal spotting, fatigue, and weakness.     Gynecologic Oncology History Jaime Hall is a pleasant G75P2 female who was seen in consultation from [name] and Garwin Brothers for grade 1 endometrial cancer.   Endometrial biopsy done 07/15/16 showed grade 1 endometrial cancer. Surgical management was recommended.   History is also significant for h/o Hodgkin's Lymphoma 2011 treated successfully at [facility] with chemo and RT.   Bladder sling for SUI 2009.  Benign cervical polyp removed 5/16. Has some leg edema due to chronic venous insufficiency.  Had angioplasty and cardiac stents in 2013 and was on Plavix for three years, now just ASA.  On 07/29/2016 she underwent robotic hysterectomy, and bilateral salpingo-oophorectomy with washings as well as sentinel node injection, mapping, and biopsy; pelvic adhesiolysis including  enterolysis and ovariolysis > 45 minutes. She was noted to have severe pelvic adhesive disease involving gynecologic organs and the bowel. The cul de sac was obliterated and adherent to the posterior cervix. A bubble test was performed and was negative.   Postop she presented with fevers on 08/05/2016 and was admitted. CT scan 08/05/1016 revealed small pockets of air in the posterior pelvic floor likely postsurgical. There is a 1.8 x 3.8 cm complex fluid collection containing pockets of air with somewhat organized walls within the pelvis at the hysterectomy bed most consistent with developing abscess. There is superior extension of loculated fluid along the left lateral pelvic wall anterior to the left psoas muscle. The component of the loculated fluid along the left lateral pelvic wall measures 2.0 x 4.0 cm and fluid collection along the anterior surface of the left psoas muscle measures approximately 1.8 x 3.6 cm. Blood cultures: + bacterimia with bacteroides fragilis; urine cultures negative.   She was treated with IV antibiotics with improvement in her symptoms. She was discharged on 09/05/2015 on Augmentin.   She was seen by Dr. Leonides Schanz and complained of vaginal leakage. 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 Subsequently she underwent a double tied tests with oral Pyridium as well as backfilling the bladder with blue. 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  Fluid in her vaginal vault was also appreciated. She was referred to Dr. [name], Urology on 08/14/2016. No obvious fistula or leak appreciated on CT urogram, although there is no complete delayed imaging to assess the right-sided ureter.   CT urogram 08/13/2016 IMPRESSION: 1. Stable size of an  abscess at the vaginal cuff with superior extension along the left pelvic sidewall to the posterior margin of the mid sigmoid colon. 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 2. No hydronephrosis. Normal caliber ureters. No evidence of contrast extravasation from the ureters or bladder, with limitations as described. 3. No evidence of metastatic disease in the abdomen or pelvis. 4. Aortic atherosclerosis.  On 08/17/3016 she underwent Exam under anesthesia, vaginoscopy, cystoscopy, cystogram with interpretation of fluoroscopy less than 30 minutes, bilateral retrograde pyelogram, and right ureteral stent placement.   Intraoperative findings: No obvious ureterovaginal vesicovaginal fistula appreciated. Mild right hydroureteronephrosis down to level of the distal ureter with slight medial deviation without filling defects. Slight mucopurulent drainage from left apical vaginal cuff, specimen cultured.  Culture revealed bateroides fragilis and she was started on a 2 week course of Flagyl iniated on 08/24/2016.  Stent removed on 09/22/2016.   Pathology DIAGNOSIS:  A. SENTINEL LYMPH NODE, RIGHT MID OBTURATOR; EXCISION:  - NO TUMOR SEEN IN ONE LYMPH NODE (0/1).   B. SENTINEL LYMPH NODE, RIGHT EXTERNAL ILIAC; EXCISION:  - NO TUMOR SEEN IN ONE LYMPH NODE (0/1).   C. SENTINEL LYMPH NODE, LEFT EXTERNAL ILIAC; EXCISION:  - NO TUMOR SEEN IN ONE LYMPH NODE (0/1).   D. UTERUS WITH CERVIX, BILATERAL FALLOPIAN TUBES AND OVARIES;  HYSTERECTOMY AND BILATERAL SALPINGO-OOPHORECTOMY:  - SEROUS CARCINOMA, 4.5 CM(SEE NOTE).  - LYMPHOVASCULAR INVASION PRESENT.  - NO TUMOR SEEN IN BILATERAL OVARIES AND FALLOPIAN TUBES.  ENDOMETRIUM:  Procedure: Hysterectomy with bilateral salpingo-oophorectomy  Histologic Type: Serous carcinoma  Myometrial Invasion: Present       Depth of invasion (millimeters): 13 mm  Myometrial thickness (millimeters): 14 mm       Percentage of myometrial invasion: 93%  Uterine Serosa Involvement: Not identified  Cervical Stromal Involvement: Not identified    BIOMARKER REPORTING TEMPLATE:  p53 Expression: Abnormal strong  diffuse overexpression (>90%)  ER/PR negative.  MS stable Her2neu assessment was performed given serous histology and was negative.   CT scan on 09/10/2016 with findings that are very reassuring so stent removed. 1. Interval resolution of abscess involving the vaginal cuff. 2. The proximal end of the right ureteral stent is coiled in the proximal right ureter. No hydronephrosis. 3. No evidence of metastatic disease or other significant changes. 4. Aortic atherosclerosis.   She was referred to Dr. Janese Banks for adjuvant chemotherapy and completed 3 cycles of carbo/taxol. Notable side effects include neuropathy in hands due to prior chemo in past for Hodgkin's disease and more recent regimen.  Taxol dose was reduced 25% with cycle 3 due to neuropathy.    Whole pelvis radiation with Dr Baruch Gouty completed in August 2018.  CT A/P 08/31/2017 IMPRESSION: No evidence of recurrent or metastatic disease.  10/06/2017 PET  IMPRESSION: NEGATIVE FOR MALIGNANCY 1. No findings of active malignancy in the neck, chest, abdomen, or pelvis. 2. Subacute sacral insufficiency fracture with transverse component at S1-2 and vertical components in both sacral ala. There is also a nondisplaced subacute fracture of the right L5 transverse process. Low-grade associated metabolic activity favors a benign etiology. 3. Other imaging findings of potential clinical significance: Aortic Atherosclerosis (ICD10-I70.0). Coronary atherosclerosis. Mild cardiomegaly. Stable paramediastinal fibrosis. Sigmoid diverticulosis.  She has continued back pain with prior hx of sacral insufficiency fracture and fracture of L5 transverse process. She was evaluated for this and declined intervention  She developed right lower quadrant pain and CT scan and PET that showed carcinomatosis.  No disease seen outside abdomen.  Biopsy done confirmed recurrent disease. She has significant residual neuropathy from prior chemo for this cancer and for  lymphoma.  Omental biopsy confirmed recurrent high-grade serous carcinoma.  Foundation 1 testing showed following alterations/biomarkers: Microsatellites status stable. Tumor mutational burden 5Muts/Mb, EPHB4 amplification, ESR1 amplification, HNF1 G261f*25, PARP1 amplification, PIK3R1 loss, PPP2R1A S256F, PTEN loss, TP53 [id number].   Repeat echocardiogram showed EF of 30-35%.  Cardiac cath unremarkable consistent with nonischemic cardiomyopathy.  Therefore Doxil was not chosen and she would be getting carboplatin and gemcitabine.  Initiated on 11/06/2019 and received 3 cycles with Neulasta support.  Chemotherapy was complicated by significant anemia causing supply demand cardiac ischemia and hospitalization.    01/11/2020. [facility]itched to [facility] lenvatinib starting.lenvatinib has been dose reduced from 14 mg to 10 mg and started Xanax.  TSH elevated at 15 [facility] was startted on levothyroxine 50 mcg.   CT 01/16/20- CT Chest Abdomen Pelvis w contrast showed significant interval reduction in size of multiple peritoneal and omental nodules in the anterior right hemiabdomen as well as soft tissue of the low right hemipelvis consistent with treatment response.  However, and new peritoneal and omental nodule in the anterior left hemiabdomen concerning for mixed response.  No evidence of metastatic disease in the chest.  Redemonstrated bilateral paramedian and perihilar radiation fibrosis consistent with history of Hodgkin's lymphoma and prior mantle radiation.  Coronary artery disease and hepatic steatosis.  CA 125 11/06/19  127 12/28/19  19.7 02/01/20  17.8  02/07/20 Pelvic exam decreased mass - not visible in the vagina. On BME 1.5-2 cm.  Tumor Markers:  Foundation 1 testing showed following alterations/biomarkers: Microsatellites status stable. Tumor mutational burden 5Muts/Mb, EPHB4 amplification, ESR1 amplification, HNF1 G29224f5, PARP1 amplification, PIK3R1 loss, PPP2R1A  S256F, PTEN loss, TP53 Y126f57f.  Problem List: Patient Active Problem List   Diagnosis Date Noted  . Hospital discharge follow-up 03/02/2020  . Hemoptysis 02/20/2020  . Acute on chronic congestive heart failure (HCC)[place]. Palliative care encounter   . Acute pulmonary embolism (HCC)[place]/06/2019  . Thrombocytopenia (HCC)[place]/06/2019  . Hypothyroidism 02/14/2020  . Chest pain 12/29/2019  . Leukocytosis 12/29/2019  . HTN (hypertension) 12/29/2019  . HLD (hyperlipidemia) 12/29/2019  . Elevated troponin 12/29/2019  . CKD (chronic kidney disease), stage IIIa 12/29/2019  . Depression with anxiety 12/29/2019  . Abnormal cardiovascular stress test   . Dyspnea on exertion   . Goals of care, counseling/discussion 10/20/2019  . 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 Peritoneal metastases (HCC)[place]/08/2019  . Right anterior knee pain 07/25/2019  . Educated about COVID-19 virus infection 11/15/2018  . Cramps, muscle, general 04/06/2018  . B12 deficiency anemia 07/24/2017  . Normocytic anemia 07/18/2017  . Back pain 07/18/2017  . Chemotherapy-induced peripheral neuropathy (HCC)[place]/28/2018  . Hyperlipidemia 08/13/2016  . Hx of colonic polyps 08/13/2016  . Benign neoplasm of sigmoid colon 08/13/2016  . Endometrial cancer (HCC)[place]/06/2016  . 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 Lymphedema 07/20/2016  . Chronic venous insufficiency 07/20/2016  . PAD (peripheral artery disease) (HCC)[place]/10/2016  . Class 1 obesity due to excess calories without serious comorbidity with body mass index (BMI) of 34.0 to 34.9 in adult 05/27/2016  . Leg cramps 05/27/2016  . Bronchiectasis (HCC)[place]/11/2015  . Pre-syncope 12/19/2015  . Prolapsed, uterovaginal, incomplete 06/24/2014  . Routine general medical examination at a health care facility 12/30/2012  . Obesity (BMI 30-39.9) 12/30/2012  . Hx of multiple pulmonary nodules 12/28/2012  .  Plantar fasciitis of right foot  12/28/2012  . Hip pain, bilateral 09/12/2012  . History of Hodgkin's lymphoma 09/12/2012  . Coronary artery disease   . Chest pain on exertion 02/29/2012    Past Medical History: Past Medical History:  Diagnosis Date  . Anxiety   . Cervicalgia   . CHF (congestive heart failure) ([place])   . Chronic kidney disease   . Coronary artery disease    a. 02/2012 Stress echo: severe anterior wall ischemia;  b. 02/2012 Cath/PCI: LAD 95p (3.0 x 15 Xience EX DES), D1 90ost (PTCA - bifurcational dzs), EF 45% with anterior HK;  b. 02/2013 Ex MV: fixed anterior defect w/ minor reversibility, nl EF-->Med Rx.  . Endometrial cancer ([place])    a. 07/2016 s/p robotic hysterectomy, BSO w/ washings, sentinel node inj, mapping, bx, adhesiolysis.  . Essential hypertension, benign   . Fibrocystic breast disease   . GERD (gastroesophageal reflux disease)   . Gestational hypertension   . Heart murmur   . History of anemia   . History of blood transfusion   . Hodgkin's lymphoma ([place]) 2011   a. s/p radiation and chemo therapy  . Osteoarthritis   . Polycystic ovarian disease     Past Surgical History: Past Surgical History:  Procedure Laterality Date  . ABDOMINAL HYSTERECTOMY    . bladder sling    . CARDIAC CATHETERIZATION  02/2012   [facility] 1 stent place  . CERVICAL POLYPECTOMY    . CHOLECYSTECTOMY  1982  . COLONOSCOPY WITH PROPOFOL N/A 02/05/2015   Procedure: COLONOSCOPY WITH PROPOFOL;  Surgeon: Lucilla Lame, MD;  Location: [facility] ENDOSCOPY;  Service: Endoscopy;  Laterality: N/A;  . CORONARY ANGIOPLASTY  02/2012   left/right s/p balloon  . CYSTOGRAM N/A 08/17/2016   Procedure: CYSTOGRAM;  Surgeon: Hollice Espy, MD;  Location: [facility] ORS;  Service: Urology;  Laterality: N/A;  . CYSTOSCOPY N/A 08/17/2016   Procedure: CYSTOSCOPY EXAM UNDER ANESTHESIA;  Surgeon: Hollice Espy, MD;  Location: [facility] ORS;  Service: Urology;  Laterality: N/A;  . CYSTOSCOPY W/ RETROGRADES Bilateral 08/17/2016   Procedure: CYSTOSCOPY WITH  RETROGRADE PYELOGRAM;  Surgeon: Hollice Espy, MD;  Location: [facility] ORS;  Service: Urology;  Laterality: Bilateral;  . CYSTOSCOPY WITH STENT PLACEMENT Right 08/17/2016   Procedure: CYSTOSCOPY WITH STENT PLACEMENT;  Surgeon: Hollice Espy, MD;  Location: [facility] ORS;  Service: Urology;  Laterality: Right;  . heart stent'  2013  . kidney stent Right 2018  . LYMPH NODE BIOPSY  2011   diagnosis of hodgkins lymphoma  . PELVIC LYMPH NODE DISSECTION N/A 07/29/2016   Procedure: PELVIC/AORTIC LYMPH NODE SAMPLING;  Surgeon: [name], MD;  Location: [facility] ORS;  Service: Gynecology;  Laterality: N/A;  . PORTA CATH INSERTION N/A 09/22/2016   Procedure: Glori Luis Cath Insertion;  Surgeon: [name], MD;  Location: [place] CV LAB;  Service: Cardiovascular;  Laterality: N/A;  . PORTA CATH INSERTION N/A 10/27/2019   Procedure: PORTA CATH INSERTION;  Surgeon: [name], MD;  Location: [place] [facility];  Service: Cardiovascular;  Laterality: N/A;  . PORTA CATH REMOVAL N/A 11/17/2016   Procedure: Glori Luis Cath Removal;  Surgeon: [name], MD;  Location: [place] [facility];  Service: Cardiovascular;  Laterality: N/A;  . RIGHT/LEFT HEART CATH AND CORONARY ANGIOGRAPHY N/A 12/04/2019   Procedure: RIGHT/LEFT HEART CATH AND CORONARY ANGIOGRAPHY;  Surgeon: Wellington Hampshire, MD;  Location: [place] [facility];  Service: Cardiovascular;  Laterality: N/A;  . ROBOTIC ASSISTED TOTAL HYSTERECTOMY WITH BILATERAL  SALPINGO OOPHERECTOMY N/A 07/29/2016   Procedure: ROBOTIC ASSISTED TOTAL HYSTERECTOMY WITH BILATERAL SALPINGO OOPHORECTOMY;  Surgeon: [name], MD;  Location: [facility] ORS;  Service: Gynecology;  Laterality: N/A;  . SENTINEL NODE BIOPSY N/A 07/29/2016   Procedure: SENTINEL NODE BIOPSY;  Surgeon: [name], MD;  Location: [facility] ORS;  Service: Gynecology;  Laterality: N/A;  . transobturator sling N/A 2009   [place]    Family History: Family History  Problem  Relation Age of Onset  . ALS Father   . Polymyositis Father   . Diabetes Brother   . Cancer Maternal Aunt        breast  . Breast cancer Maternal Aunt        30's  . Stroke Maternal Grandmother   . Cancer Maternal Grandfather        prostate  . Stroke Maternal Grandfather   . Colon cancer Maternal Aunt   . Non-Hodgkin's lymphoma Cousin     Social History: Works as Dealer Social History   Socioeconomic History  . Marital status: Widowed    Spouse name: Not on file  . Number of children: Not on file  . Years of education: Not on file  . Highest education level: Not on file  Occupational History  . Not on file  Tobacco Use  . Smoking status: Former Smoker    Packs/day: 1.00    Years: 30.00    Pack years: 30.00    Types: Cigarettes    Quit date: 07/24/2002    Years since quitting: 17.8  . Smokeless tobacco: Never Used  . Tobacco comment: quit smoking in 2000  Vaping Use  . Vaping Use: Never used  Substance and Sexual Activity  . Alcohol use: Not Currently  . Drug use: No  . Sexual activity: Never  Other Topics Concern  . 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    Social Determinants of Health   Financial Resource Strain:   . Difficulty of Paying Living Expenses: Not on file  Food Insecurity:   . Worried About Charity fundraiser in the Last Year: Not on file  . Ran Out of Food in the Last Year: Not on file  Transportation Needs:   . Lack of Transportation (Medical): Not on file  . Lack of Transportation (Non-Medical): Not on file  Physical Activity:   . Days of Exercise per Week: Not on file  . Minutes of Exercise per Session: Not on file  Stress:   . Feeling of Stress : Not on file  Social Connections:   . Frequency of Communication with Friends and Family: Not on file  . Frequency of Social Gatherings with Friends and Family: Not on file  . Attends Religious  Services: Not on file  . Active Member of Clubs or Organizations: Not on file  . Attends Archivist Meetings: Not on file  . Marital Status: Not on file  Intimate Partner Violence:   . Fear of Current or Ex-Partner: Not on file  . Emotionally Abused: Not on file  . Physically Abused: Not on file  . Sexually Abused: Not on file    Allergies: Allergies  Allergen Reactions  . Adhesive [Tape] Other (See Comments)    Skin Irritation   . Antifungal [Miconazole Nitrate] Rash  . Sulfa Antibiotics Nausea And Vomiting and Rash  . Z-Pak [Azithromycin] Itching  Current Medications: Current Outpatient Medications  Medication Sig Dispense Refill  . amitriptyline (ELAVIL) 25 MG tablet Take 1 tablet (25 mg total) by mouth at bedtime. May increase weekly as needed 90 tablet 3  . apixaban (ELIQUIS) 2.5 MG TABS tablet Take 1 tablet (2.5 mg total) by mouth 2 (two) times daily. 60 tablet 1  . atorvastatin (LIPITOR) 10 MG tablet Take 1 tablet by mouth once daily 90 tablet 2  . carvedilol (COREG) 3.125 MG tablet Take 1 tablet (3.125 mg total) by mouth 2 (two) times daily with a meal. 90 tablet 1  . cephALEXin (KEFLEX) 500 MG capsule Take 1 capsule (500 mg total) by mouth 2 (two) times daily. 14 capsule 0  . cholecalciferol (VITAMIN D3) 25 MCG (1000 UNIT) tablet Take 1,000 Units by mouth in the morning and at bedtime.    . cyanocobalamin (,VITAMIN B-12,) 1000 MCG/ML injection INJECT 1ML IM WEEKLY FOR 4 WEEKS, THEN INJECT MONTHLY THEREAFTER (Patient taking differently: Inject 1,000 mcg into the skin every 30 (thirty) days. ) 10 mL 0  . furosemide (LASIX) 20 MG tablet Take 1 tablet (20 mg total) by mouth daily. (Patient taking differently: Take 20 mg by mouth as needed. ) 90 tablet 0  . lenvatinib 8 mg daily dose (LENVIMA, 8 MG DAILY DOSE,) 2 x 4 MG capsule Take 2 capsules (8 mg total) by mouth daily. 60 capsule 2  . levothyroxine (SYNTHROID) 75 MCG tablet Take 1 tablet (75 mcg total) by mouth  daily before breakfast. 30 tablet 2  . LORazepam (ATIVAN) 0.5 MG tablet Take 1 tablet (0.5 mg total) by mouth every 6 (six) hours as needed for anxiety (and to help with breathing). 120 tablet 0  . losartan (COZAAR) 25 MG tablet Take 0.5 tablets (12.5 mg total) by mouth daily. 15 tablet 2  . Morphine Sulfate (MORPHINE CONCENTRATE) 10 mg / 0.5 ml concentrated solution Take 0.25 mLs (5 mg total) by mouth every 6 (six) hours as needed for severe pain or shortness of breath. 30 mL 0  . pantoprazole (PROTONIX) 20 MG tablet Take 1 tablet (20 mg total) by mouth daily. 60 tablet 1  . potassium chloride (KLOR-CON) 10 MEQ tablet Take 1 tablet (10 mEq total) by mouth daily. 90 tablet 1  . triamcinolone cream (KENALOG) 0.1 % Apply 1 application topically 2 (two) times daily. 28.4 g tube 30 g 0   No current facility-administered medications for this visit.   Facility-Administered Medications Ordered in Other Visits  Medication Dose Route Frequency Provider Last Rate Last Admin  . heparin lock flush 100 unit/mL  500 Units Intravenous Once Sindy Guadeloupe, MD      . sodium chloride flush (NS) 0.9 % injection 10 mL  10 mL Intravenous PRN Sindy Guadeloupe, MD   10 mL at 12/07/19 0914  . sodium chloride flush (NS) 0.9 % injection 10 mL  10 mL Intravenous PRN Sindy Guadeloupe, MD   10 mL at 01/04/20 0850   Review of Systems General:  Fatigue & weakness Skin: no complaints Eyes: no complaints HEENT: no complaints Breasts: no complaints Pulmonary: no complaints Cardiac: no complaints Gastrointestinal: no complaints Genitourinary/Sexual: no complaints Ob/Gyn: vaginal spotting and bleeding Musculoskeletal: no complaints Hematology: no complaints Neurologic/Psych: no complaints   Objective:  Physical Examination:  BP (!) 95/51   Pulse 88   Temp 98 F (36.7 C)   Resp 20   Wt 150 lb 11.2 oz (68.4 kg)   SpO2 99%   BMI 28.47  kg/m    ECOG Performance Status: 2 - Symptomatic, <50% confined to  bed  GENERAL: More frail appearing compared to previous exams HEENT:  Sclera clear. Anicteric NODES:  Negative inguinal lymph node survery ABDOMEN:  Soft, nontender.  No hernias, incisions well healed. No masses or ascites SKIN:  Clear with no obvious rashes or skin changes NEURO:  Nonfocal. Well oriented.  Appropriate affect.  Pelvic: Exam chaperoned by nursing. EGBUS: no lesions. Vagina: 1 cm friable lesion at the left vaginal fornix. Cervix, Uterus, Ovaries: surgically absent. Bimanual: reveals a vaginal cuff mass measuring 2-3 cm which has increased since prior exam. Rectal: confirmatory.   Labs  Lab Results  Component Value Date   WBC 10.9 (H) 05/09/2020   HGB 9.5 (L) 05/09/2020   HCT 30.8 (L) 05/09/2020   MCV 93.1 05/09/2020   PLT 247 05/09/2020     Chemistry      Component Value Date/Time   NA 137 05/09/2020 0031   NA 140 02/15/2019 0000   NA 142 03/27/2014 1214   K 3.5 05/09/2020 0031   K 4.6 03/27/2014 1214   CL 101 05/09/2020 0031   CL 107 03/27/2014 1214   CO2 24 05/09/2020 0031   CO2 33 (H) 03/27/2014 1214   BUN 19 05/09/2020 0031   BUN 19 02/15/2019 0000   BUN 14 03/27/2014 1214   CREATININE 1.86 (H) 05/09/2020 0031   CREATININE 1.04 03/27/2014 1214   GLU 108 02/15/2019 0000      Component Value Date/Time   CALCIUM 8.6 (L) 05/09/2020 0031   CALCIUM 8.7 03/27/2014 1214   ALKPHOS 84 05/09/2020 0031   ALKPHOS 81 03/27/2014 1214   AST 20 05/09/2020 0031   AST 17 03/27/2014 1214   ALT 10 05/09/2020 0031   ALT 22 03/27/2014 1214   BILITOT 1.3 (H) 05/09/2020 0031   BILITOT 1.1 (H) 03/27/2014 1214     Albumin 3.3  Radiology As per HPI  CT head, C/A/P ordered    Assessment:  ILIANI VEJAR is a 69 y.o. female diagnosed with recurrent high grade serous endometrial cancer, high intermediate risk disease s/p RA- hysterectomy, BSO, SN mapping and biopsy, pelvic adhesiolysis 2/18 and 3 cycles carbo-taxol and WPRT.  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 Complete resolution of vaginal cuff abscess.  Severe pelvic adhesive disease.  5/21 Recurrent disease with carcinomatosis  (not a candidate for Doxil due to EF and prior adriamycin); carboplatin/gemcitabine chemotherapy x 3 cycles but discontinued due to hematologic toxicity, cardiac issues and mixed response; currently on lenvatinib and pembrolizumab with concern for progressive disease.    Foundation 1 testing showed following alterations/biomarkers: Microsatellites status stable. Tumor mutational burden 5Muts/Mb, EPHB4 amplification, ESR1 amplification, HNF1 G224f*25, PARP1 amplification, PIK3R1 loss, PPP2R1A S256F, PTEN loss, TP53 [id number].   Elevated creatinine, possible due to dehydration versus other etiologies such as obstruction  Hypoalbuminemia, continue to monitor  Hyperbilirubinemia, continue to follow  Corrected calcium, normal  Medical co-morbidities complicating care: 30 pack year former smoker s/p coronary angioplasty and stents 2013 and takes baby ASA. Prior chemo and RT for lymphoma.    Plan:   Problem List Items Addressed This Visit      Genitourinary   Endometrial cancer ([place]- Primary     She will continue to see Dr. RaJanese Banksor lenvatinib and pembrolizumab. If her CT demonstrates progression  she may be a candidate for other treatment options include mTOR or PIK3 inhibitor given tumor genomic alterations.She may be interested in clinical trials. However, her options may be limited due to cardiac disease.   Recommended follow up with Dr. Janese Banks to closely monitor lab findings.   I personally had a face to face interaction and evaluated the patient, and performed the physical exam. The assessment and plan which was fully formulated by me.  Counseling was completed by me.    Verlon Au, NP scribed the note.   Jaime Overall Gaetana Michaelis, MD

## 2020-05-17 ENCOUNTER — Other Ambulatory Visit: Payer: Self-pay

## 2020-05-17 ENCOUNTER — Inpatient Hospital Stay (HOSPITAL_BASED_OUTPATIENT_CLINIC_OR_DEPARTMENT_OTHER): Payer: PPO | Admitting: Oncology

## 2020-05-17 ENCOUNTER — Inpatient Hospital Stay: Payer: PPO

## 2020-05-17 VITALS — BP 100/57 | HR 91 | Resp 18

## 2020-05-17 VITALS — BP 94/53 | HR 98 | Temp 98.8°F | Resp 16 | Wt 152.1 lb

## 2020-05-17 DIAGNOSIS — N390 Urinary tract infection, site not specified: Secondary | ICD-10-CM | POA: Diagnosis not present

## 2020-05-17 DIAGNOSIS — F418 Other specified anxiety disorders: Secondary | ICD-10-CM | POA: Diagnosis present

## 2020-05-17 DIAGNOSIS — R0689 Other abnormalities of breathing: Secondary | ICD-10-CM | POA: Diagnosis not present

## 2020-05-17 DIAGNOSIS — Z8542 Personal history of malignant neoplasm of other parts of uterus: Secondary | ICD-10-CM | POA: Diagnosis not present

## 2020-05-17 DIAGNOSIS — C8190 Hodgkin lymphoma, unspecified, unspecified site: Secondary | ICD-10-CM | POA: Diagnosis not present

## 2020-05-17 DIAGNOSIS — D72829 Elevated white blood cell count, unspecified: Secondary | ICD-10-CM | POA: Diagnosis not present

## 2020-05-17 DIAGNOSIS — R079 Chest pain, unspecified: Secondary | ICD-10-CM | POA: Diagnosis not present

## 2020-05-17 DIAGNOSIS — Z20822 Contact with and (suspected) exposure to covid-19: Secondary | ICD-10-CM | POA: Diagnosis present

## 2020-05-17 DIAGNOSIS — N179 Acute kidney failure, unspecified: Secondary | ICD-10-CM | POA: Diagnosis not present

## 2020-05-17 DIAGNOSIS — Z5112 Encounter for antineoplastic immunotherapy: Secondary | ICD-10-CM | POA: Diagnosis not present

## 2020-05-17 DIAGNOSIS — Z66 Do not resuscitate: Secondary | ICD-10-CM | POA: Diagnosis not present

## 2020-05-17 DIAGNOSIS — R069 Unspecified abnormalities of breathing: Secondary | ICD-10-CM | POA: Diagnosis not present

## 2020-05-17 DIAGNOSIS — Z87891 Personal history of nicotine dependence: Secondary | ICD-10-CM

## 2020-05-17 DIAGNOSIS — M1990 Unspecified osteoarthritis, unspecified site: Secondary | ICD-10-CM | POA: Diagnosis present

## 2020-05-17 DIAGNOSIS — I959 Hypotension, unspecified: Secondary | ICD-10-CM | POA: Diagnosis present

## 2020-05-17 DIAGNOSIS — F419 Anxiety disorder, unspecified: Secondary | ICD-10-CM | POA: Diagnosis not present

## 2020-05-17 DIAGNOSIS — R042 Hemoptysis: Secondary | ICD-10-CM | POA: Diagnosis present

## 2020-05-17 DIAGNOSIS — I471 Supraventricular tachycardia: Secondary | ICD-10-CM | POA: Diagnosis not present

## 2020-05-17 DIAGNOSIS — K219 Gastro-esophageal reflux disease without esophagitis: Secondary | ICD-10-CM | POA: Diagnosis present

## 2020-05-17 DIAGNOSIS — E039 Hypothyroidism, unspecified: Secondary | ICD-10-CM | POA: Diagnosis present

## 2020-05-17 DIAGNOSIS — Z9049 Acquired absence of other specified parts of digestive tract: Secondary | ICD-10-CM

## 2020-05-17 DIAGNOSIS — Z90710 Acquired absence of both cervix and uterus: Secondary | ICD-10-CM | POA: Diagnosis not present

## 2020-05-17 DIAGNOSIS — I2699 Other pulmonary embolism without acute cor pulmonale: Secondary | ICD-10-CM | POA: Diagnosis not present

## 2020-05-17 DIAGNOSIS — I10 Essential (primary) hypertension: Secondary | ICD-10-CM | POA: Diagnosis not present

## 2020-05-17 DIAGNOSIS — R0602 Shortness of breath: Secondary | ICD-10-CM | POA: Diagnosis not present

## 2020-05-17 DIAGNOSIS — Z79899 Other long term (current) drug therapy: Secondary | ICD-10-CM

## 2020-05-17 DIAGNOSIS — E785 Hyperlipidemia, unspecified: Secondary | ICD-10-CM | POA: Diagnosis present

## 2020-05-17 DIAGNOSIS — Z7901 Long term (current) use of anticoagulants: Secondary | ICD-10-CM

## 2020-05-17 DIAGNOSIS — C786 Secondary malignant neoplasm of retroperitoneum and peritoneum: Secondary | ICD-10-CM | POA: Diagnosis present

## 2020-05-17 DIAGNOSIS — I5023 Acute on chronic systolic (congestive) heart failure: Secondary | ICD-10-CM | POA: Diagnosis not present

## 2020-05-17 DIAGNOSIS — Z882 Allergy status to sulfonamides status: Secondary | ICD-10-CM

## 2020-05-17 DIAGNOSIS — Z803 Family history of malignant neoplasm of breast: Secondary | ICD-10-CM

## 2020-05-17 DIAGNOSIS — N183 Chronic kidney disease, stage 3 unspecified: Secondary | ICD-10-CM | POA: Diagnosis present

## 2020-05-17 DIAGNOSIS — N1831 Chronic kidney disease, stage 3a: Secondary | ICD-10-CM | POA: Diagnosis not present

## 2020-05-17 DIAGNOSIS — Z888 Allergy status to other drugs, medicaments and biological substances status: Secondary | ICD-10-CM | POA: Diagnosis not present

## 2020-05-17 DIAGNOSIS — R7989 Other specified abnormal findings of blood chemistry: Secondary | ICD-10-CM | POA: Diagnosis present

## 2020-05-17 DIAGNOSIS — I358 Other nonrheumatic aortic valve disorders: Secondary | ICD-10-CM | POA: Diagnosis present

## 2020-05-17 DIAGNOSIS — M7989 Other specified soft tissue disorders: Secondary | ICD-10-CM | POA: Diagnosis present

## 2020-05-17 DIAGNOSIS — R55 Syncope and collapse: Secondary | ICD-10-CM | POA: Diagnosis present

## 2020-05-17 DIAGNOSIS — Z86711 Personal history of pulmonary embolism: Secondary | ICD-10-CM

## 2020-05-17 DIAGNOSIS — I25118 Atherosclerotic heart disease of native coronary artery with other forms of angina pectoris: Secondary | ICD-10-CM | POA: Diagnosis not present

## 2020-05-17 DIAGNOSIS — Z923 Personal history of irradiation: Secondary | ICD-10-CM

## 2020-05-17 DIAGNOSIS — Z833 Family history of diabetes mellitus: Secondary | ICD-10-CM

## 2020-05-17 DIAGNOSIS — Z955 Presence of coronary angioplasty implant and graft: Secondary | ICD-10-CM

## 2020-05-17 DIAGNOSIS — I130 Hypertensive heart and chronic kidney disease with heart failure and stage 1 through stage 4 chronic kidney disease, or unspecified chronic kidney disease: Secondary | ICD-10-CM | POA: Diagnosis not present

## 2020-05-17 DIAGNOSIS — C541 Malignant neoplasm of endometrium: Secondary | ICD-10-CM

## 2020-05-17 DIAGNOSIS — Z823 Family history of stroke: Secondary | ICD-10-CM

## 2020-05-17 DIAGNOSIS — Z807 Family history of other malignant neoplasms of lymphoid, hematopoietic and related tissues: Secondary | ICD-10-CM

## 2020-05-17 DIAGNOSIS — I2510 Atherosclerotic heart disease of native coronary artery without angina pectoris: Secondary | ICD-10-CM | POA: Diagnosis present

## 2020-05-17 DIAGNOSIS — I4891 Unspecified atrial fibrillation: Secondary | ICD-10-CM | POA: Diagnosis not present

## 2020-05-17 DIAGNOSIS — I5021 Acute systolic (congestive) heart failure: Secondary | ICD-10-CM | POA: Diagnosis not present

## 2020-05-17 DIAGNOSIS — Z800 Family history of malignant neoplasm of digestive organs: Secondary | ICD-10-CM

## 2020-05-17 DIAGNOSIS — I5043 Acute on chronic combined systolic (congestive) and diastolic (congestive) heart failure: Secondary | ICD-10-CM | POA: Diagnosis present

## 2020-05-17 DIAGNOSIS — Z79890 Hormone replacement therapy (postmenopausal): Secondary | ICD-10-CM

## 2020-05-17 DIAGNOSIS — E282 Polycystic ovarian syndrome: Secondary | ICD-10-CM | POA: Diagnosis not present

## 2020-05-17 DIAGNOSIS — T501X6A Underdosing of loop [high-ceiling] diuretics, initial encounter: Secondary | ICD-10-CM | POA: Diagnosis present

## 2020-05-17 DIAGNOSIS — I110 Hypertensive heart disease with heart failure: Secondary | ICD-10-CM | POA: Diagnosis not present

## 2020-05-17 DIAGNOSIS — D631 Anemia in chronic kidney disease: Secondary | ICD-10-CM | POA: Diagnosis present

## 2020-05-17 DIAGNOSIS — T451X5A Adverse effect of antineoplastic and immunosuppressive drugs, initial encounter: Secondary | ICD-10-CM | POA: Diagnosis present

## 2020-05-17 DIAGNOSIS — I428 Other cardiomyopathies: Secondary | ICD-10-CM | POA: Diagnosis present

## 2020-05-17 DIAGNOSIS — E876 Hypokalemia: Secondary | ICD-10-CM | POA: Diagnosis present

## 2020-05-17 DIAGNOSIS — D649 Anemia, unspecified: Secondary | ICD-10-CM | POA: Diagnosis not present

## 2020-05-17 LAB — COMPREHENSIVE METABOLIC PANEL
ALT: 10 U/L (ref 0–44)
AST: 18 U/L (ref 15–41)
Albumin: 3.2 g/dL — ABNORMAL LOW (ref 3.5–5.0)
Alkaline Phosphatase: 76 U/L (ref 38–126)
Anion gap: 14 (ref 5–15)
BUN: 12 mg/dL (ref 8–23)
CO2: 23 mmol/L (ref 22–32)
Calcium: 8.8 mg/dL — ABNORMAL LOW (ref 8.9–10.3)
Chloride: 101 mmol/L (ref 98–111)
Creatinine, Ser: 1.19 mg/dL — ABNORMAL HIGH (ref 0.44–1.00)
GFR, Estimated: 49 mL/min — ABNORMAL LOW (ref >60)
Glucose, Bld: 125 mg/dL — ABNORMAL HIGH (ref 70–99)
Potassium: 3.4 mmol/L — ABNORMAL LOW (ref 3.5–5.1)
Sodium: 138 mmol/L (ref 135–145)
Total Bilirubin: 0.8 mg/dL (ref 0.3–1.2)
Total Protein: 6.4 g/dL — ABNORMAL LOW (ref 6.5–8.1)

## 2020-05-17 LAB — CBC WITH DIFFERENTIAL/PLATELET
Abs Immature Granulocytes: 0.04 10*3/uL (ref 0.00–0.07)
Basophils Absolute: 0.1 10*3/uL (ref 0.0–0.1)
Basophils Relative: 1 %
Eosinophils Absolute: 0.5 10*3/uL (ref 0.0–0.5)
Eosinophils Relative: 5 %
HCT: 30.4 % — ABNORMAL LOW (ref 36.0–46.0)
Hemoglobin: 9.6 g/dL — ABNORMAL LOW (ref 12.0–15.0)
Immature Granulocytes: 1 %
Lymphocytes Relative: 10 %
Lymphs Abs: 0.9 10*3/uL (ref 0.7–4.0)
MCH: 28.4 pg (ref 26.0–34.0)
MCHC: 31.6 g/dL (ref 30.0–36.0)
MCV: 89.9 fL (ref 80.0–100.0)
Monocytes Absolute: 0.5 10*3/uL (ref 0.1–1.0)
Monocytes Relative: 5 %
Neutro Abs: 6.8 10*3/uL (ref 1.7–7.7)
Neutrophils Relative %: 78 %
Platelets: 255 10*3/uL (ref 150–400)
RBC: 3.38 MIL/uL — ABNORMAL LOW (ref 3.87–5.11)
RDW: 15.8 % — ABNORMAL HIGH (ref 11.5–15.5)
WBC: 8.7 10*3/uL (ref 4.0–10.5)
nRBC: 0 % (ref 0.0–0.2)

## 2020-05-17 LAB — TSH: TSH: 0.637 u[IU]/mL (ref 0.350–4.500)

## 2020-05-17 MED ORDER — HEPARIN SOD (PORK) LOCK FLUSH 100 UNIT/ML IV SOLN
INTRAVENOUS | Status: AC
  Filled 2020-05-17: qty 5

## 2020-05-17 MED ORDER — PANTOPRAZOLE SODIUM 20 MG PO TBEC: 20.0000 mg | DELAYED_RELEASE_TABLET | Freq: Every day | ORAL | 1 refills | Status: DC

## 2020-05-17 MED ORDER — HEPARIN SOD (PORK) LOCK FLUSH 100 UNIT/ML IV SOLN
500.0000 [IU] | Freq: Once | INTRAVENOUS | Status: AC
  Administered 2020-05-17: 500 [IU] via INTRAVENOUS
  Filled 2020-05-17: qty 5

## 2020-05-17 MED ORDER — SODIUM CHLORIDE 0.9 % IV SOLN
200.0000 mg | Freq: Once | INTRAVENOUS | Status: AC
  Administered 2020-05-17: 200 mg via INTRAVENOUS
  Filled 2020-05-17: qty 8

## 2020-05-17 MED ORDER — SODIUM CHLORIDE 0.9% FLUSH
10.0000 mL | Freq: Once | INTRAVENOUS | Status: AC
  Administered 2020-05-17: 10 mL via INTRAVENOUS
  Filled 2020-05-17: qty 10

## 2020-05-17 MED ORDER — SODIUM CHLORIDE 0.9 % IV SOLN
Freq: Once | INTRAVENOUS | Status: AC
  Filled 2020-05-17: qty 250

## 2020-05-17 NOTE — Progress Notes (Signed)
Hematology/Oncology Consult note [facility]  Telephone:([phone] [phone]  Patient Care Team: [name], MD as PCP - General (Internal Medicine) Wellington Hampshire, MD as PCP - Cardiology (Cardiology) [name], MD as Consulting Physician (General Surgery) Mellody Drown, MD as Referring Physician (Obstetrics and Gynecology) [name], MD as Referring Physician (Obstetrics and Gynecology) Hollice Espy, MD as Consulting Physician (Urology) [name], RN as Registered Nurse Sindy Guadeloupe, MD as Consulting Physician (Oncology)   Name of the patient: Jaime Hall  [id number]  [birth year]   Date of visit: 05/17/20  Diagnosis-  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 She underwent robotic hysterectomy with bilateral salpingo-oophorectomy with washings and sentinel lymph node injection mapping and biopsy in February 2018. She had multiple postoperative complications and completed 3 cycles of carbotaxol chemotherapy in May 2018. Treatment was complicated by chemo-induced peripheral neuropathy for which Taxol dose was reduced by 25%. She completed 5 weeks of external beam whole pelvic radiation on August 2018.Her original tumor in 2018 was ER +1%. PR negative. MSI stable and HER-2 negative  She was under clinical surveillance and recently presented to [name] with symptoms of right lower quadrant abdominal pain which led to aCT abdomen which showed new peritoneal metastatic disease throughout the right abdomen and pelvis with index masses measuring 5.7 x 4.7 cm. Head CT scan also  showed evidence of hypermetabolic abdominal pelvic peritoneal metastases but no evidence of other sites of metastases.  Repeat omental biopsy confirms high grade serous carcinoma.Foundation 1 testing showed following alteration/biomarkers. Microsatellite status stable. EPH for be amplification, ESR 1 amplification, HNF1 A G 292 FS 25, follow-up 1 amplification, with 3 ER 1 loss, PPP 2R1 AAS 256F, PTEN loss, T p53. However none of these mutations have actionable targets.  Repeat echocardiogram showed EF of 30 to 35%. Cardiac cath unremarkable consistent with nonischemic cardiomyopathy.Therefore Doxil not chosen and she would be getting carboplatin and gemcitabine. Chemotherapy complicated by significant anemia causing supply demand cardiac ischemia and hospitalization.Patient switched to [facility] and lenvatinib starting 01/11/2020  Patient received two cycles of Keytruda but treatment washeldsince August 2021 due to repeated hospitalizations for possible heart failure followed by diagnosis of PE.   Interval history-patient has not restarted Lenvima yet.  She was seen by GYN oncology and underwent pelvic exam which was concerning for vaginal recurrence.  She has more vaginal bleeding since she has had the exam.  She was also in the ER for palpitations and a heart rate of 180.  She was given medications for that and discharged on the same day.  She has not had those symptoms since then.  Denies any leg swelling.  Has baseline exertional shortness of breath but remains off oxygen  ECOG PS- 2 Pain scale- 0   Review of systems- Review of Systems  Constitutional: Positive for malaise/fatigue. Negative for chills, fever and weight loss.  HENT: Negative for congestion, ear discharge and nosebleeds.   Eyes: Negative for blurred vision.  Respiratory: Positive for shortness of breath. Negative for cough, hemoptysis, sputum production and wheezing.   Cardiovascular: Negative for chest pain,  palpitations, orthopnea and claudication.  Gastrointestinal: Negative for abdominal pain, blood in  stool, constipation, diarrhea, heartburn, melena, nausea and vomiting.  Genitourinary: Negative for dysuria, flank pain, frequency, hematuria and urgency.       Vaginal bleeding  Musculoskeletal: Negative for back pain, joint pain and myalgias.  Skin: Negative for rash.  Neurological: Negative for dizziness, tingling, focal weakness, seizures, weakness and headaches.  Endo/Heme/Allergies: Does not bruise/bleed easily.  Psychiatric/Behavioral: Negative for depression and suicidal ideas. The patient does not have insomnia.       Allergies  Allergen Reactions  . Adhesive [Tape] Other (See Comments)    Skin Irritation   . Antifungal [Miconazole Nitrate] Rash  . Sulfa Antibiotics Nausea And Vomiting and Rash  . Z-Pak [Azithromycin] Itching     Past Medical History:  Diagnosis Date  . Anxiety   . Cervicalgia   . CHF (congestive heart failure) ([place])   . Chronic kidney disease   . Coronary artery disease    a. 02/2012 Stress echo: severe anterior wall ischemia;  b. 02/2012 Cath/PCI: LAD 95p (3.0 x 15 Xience EX DES), D1 90ost (PTCA - bifurcational dzs), EF 45% with anterior HK;  b. 02/2013 Ex MV: fixed anterior defect w/ minor reversibility, nl EF-->Med Rx.  . Endometrial cancer ([place])    a. 07/2016 s/p robotic hysterectomy, BSO w/ washings, sentinel node inj, mapping, bx, adhesiolysis.  . Essential hypertension, benign   . Fibrocystic breast disease   . GERD (gastroesophageal reflux disease)   . Gestational hypertension   . Heart murmur   . History of anemia   . History of blood transfusion   . Hodgkin's lymphoma ([place]) 2011   a. s/p radiation and chemo therapy  . Osteoarthritis   . Polycystic ovarian disease      Past Surgical History:  Procedure Laterality Date  . ABDOMINAL HYSTERECTOMY    . bladder sling    . CARDIAC CATHETERIZATION  02/2012   [facility] 1 stent place  . CERVICAL  POLYPECTOMY    . CHOLECYSTECTOMY  1982  . COLONOSCOPY WITH PROPOFOL N/A 02/05/2015   Procedure: COLONOSCOPY WITH PROPOFOL;  Surgeon: Lucilla Lame, MD;  Location: [facility] ENDOSCOPY;  Service: Endoscopy;  Laterality: N/A;  . CORONARY ANGIOPLASTY  02/2012   left/right s/p balloon  . CYSTOGRAM N/A 08/17/2016   Procedure: CYSTOGRAM;  Surgeon: Hollice Espy, MD;  Location: [facility] ORS;  Service: Urology;  Laterality: N/A;  . CYSTOSCOPY N/A 08/17/2016   Procedure: CYSTOSCOPY EXAM UNDER ANESTHESIA;  Surgeon: Hollice Espy, MD;  Location: [facility] ORS;  Service: Urology;  Laterality: N/A;  . CYSTOSCOPY W/ RETROGRADES Bilateral 08/17/2016   Procedure: CYSTOSCOPY WITH RETROGRADE PYELOGRAM;  Surgeon: Hollice Espy, MD;  Location: [facility] ORS;  Service: Urology;  Laterality: Bilateral;  . CYSTOSCOPY WITH STENT PLACEMENT Right 08/17/2016   Procedure: CYSTOSCOPY WITH STENT PLACEMENT;  Surgeon: Hollice Espy, MD;  Location: [facility] ORS;  Service: Urology;  Laterality: Right;  . heart stent'  2013  . kidney stent Right 2018  . LYMPH NODE BIOPSY  2011   diagnosis of hodgkins lymphoma  . PELVIC LYMPH NODE DISSECTION N/A 07/29/2016   Procedure: PELVIC/AORTIC LYMPH NODE SAMPLING;  Surgeon: [name], MD;  Location: [facility] ORS;  Service: Gynecology;  Laterality: N/A;  . PORTA CATH INSERTION N/A 09/22/2016   Procedure: Glori Luis Cath Insertion;  Surgeon: [name], MD;  Location: [place] [facility];  Service: Cardiovascular;  Laterality: N/A;  . PORTA CATH INSERTION N/A 10/27/2019   Procedure: PORTA CATH INSERTION;  Surgeon: [name], MD;  Location: [place] [facility];  Service:  Cardiovascular;  Laterality: N/A;  . PORTA CATH REMOVAL N/A 11/17/2016   Procedure: Glori Luis Cath Removal;  Surgeon: [name], MD;  Location: [place] [facility];  Service: Cardiovascular;  Laterality: N/A;  . RIGHT/LEFT HEART CATH AND CORONARY ANGIOGRAPHY N/A 12/04/2019   Procedure: RIGHT/LEFT HEART CATH AND CORONARY  ANGIOGRAPHY;  Surgeon: Wellington Hampshire, MD;  Location: [place] [facility];  Service: Cardiovascular;  Laterality: N/A;  . ROBOTIC ASSISTED TOTAL HYSTERECTOMY WITH BILATERAL SALPINGO OOPHERECTOMY N/A 07/29/2016   Procedure: ROBOTIC ASSISTED TOTAL HYSTERECTOMY WITH BILATERAL SALPINGO OOPHORECTOMY;  Surgeon: [name], MD;  Location: [facility] ORS;  Service: Gynecology;  Laterality: N/A;  . SENTINEL NODE BIOPSY N/A 07/29/2016   Procedure: SENTINEL NODE BIOPSY;  Surgeon: [name], MD;  Location: [facility] ORS;  Service: Gynecology;  Laterality: N/A;  . transobturator sling N/A 2009   [place]    Social History   Socioeconomic History  . Marital status: Widowed    Spouse name: Not on file  . Number of children: Not on file  . Years of education: Not on file  . Highest education level: Not on file  Occupational History  . Not on file  Tobacco Use  . Smoking status: Former Smoker    Packs/day: 1.00    Years: 30.00    Pack years: 30.00    Types: Cigarettes    Quit date: 07/24/2002    Years since quitting: 17.8  . Smokeless tobacco: Never Used  . Tobacco comment: quit smoking in 2000  Vaping Use  . Vaping Use: Never used  Substance and Sexual Activity  . Alcohol use: Not Currently  . Drug use: No  . Sexual activity: Never  Other Topics Concern  . 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    Social Determinants of Health   Financial Resource Strain:   . Difficulty of Paying Living Expenses: Not on file  Food Insecurity:   . Worried About Charity fundraiser in the Last Year: Not on file  . Ran Out of Food in the Last Year: Not on file  Transportation Needs:   . Lack of Transportation (Medical): Not on file  . Lack of Transportation (Non-Medical): Not on file  Physical Activity:   . Days of Exercise per Week: Not on file  . Minutes of Exercise per Session: Not on file    Stress:   . Feeling of Stress : Not on file  Social Connections:   . Frequency of Communication with Friends and Family: Not on file  . Frequency of Social Gatherings with Friends and Family: Not on file  . Attends Religious Services: Not on file  . Active Member of Clubs or Organizations: Not on file  . Attends Archivist Meetings: Not on file  . Marital Status: Not on file  Intimate Partner Violence:   . Fear of Current or Ex-Partner: Not on file  . Emotionally Abused: Not on file  . Physically Abused: Not on file  . Sexually Abused: Not on file    Family History  Problem Relation Age of Onset  . ALS Father   . Polymyositis Father   . Diabetes Brother   . Cancer Maternal Aunt        breast  . Breast cancer Maternal Aunt        30's  . Stroke Maternal Grandmother   .  Cancer Maternal Grandfather        prostate  . Stroke Maternal Grandfather   . Colon cancer Maternal Aunt   . Non-Hodgkin's lymphoma Cousin      Current Outpatient Medications:  .  amitriptyline (ELAVIL) 25 MG tablet, Take 1 tablet (25 mg total) by mouth at bedtime. May increase weekly as needed, Disp: 90 tablet, Rfl: 3 .  apixaban (ELIQUIS) 2.5 MG TABS tablet, Take 1 tablet (2.5 mg total) by mouth 2 (two) times daily., Disp: 60 tablet, Rfl: 1 .  atorvastatin (LIPITOR) 10 MG tablet, Take 1 tablet by mouth once daily, Disp: 90 tablet, Rfl: 2 .  carvedilol (COREG) 3.125 MG tablet, Take 1 tablet (3.125 mg total) by mouth 2 (two) times daily with a meal., Disp: 90 tablet, Rfl: 1 .  cephALEXin (KEFLEX) 500 MG capsule, Take 1 capsule (500 mg total) by mouth 2 (two) times daily., Disp: 14 capsule, Rfl: 0 .  cholecalciferol (VITAMIN D3) 25 MCG (1000 UNIT) tablet, Take 1,000 Units by mouth in the morning and at bedtime., Disp: , Rfl:  .  cyanocobalamin (,VITAMIN B-12,) 1000 MCG/ML injection, INJECT 1ML IM WEEKLY FOR 4 WEEKS, THEN INJECT MONTHLY THEREAFTER (Patient taking differently: Inject 1,000 mcg into the  skin every 30 (thirty) days. ), Disp: 10 mL, Rfl: 0 .  furosemide (LASIX) 20 MG tablet, Take 1 tablet (20 mg total) by mouth daily. (Patient taking differently: Take 20 mg by mouth as needed. ), Disp: 90 tablet, Rfl: 0 .  lenvatinib 8 mg daily dose (LENVIMA, 8 MG DAILY DOSE,) 2 x 4 MG capsule, Take 2 capsules (8 mg total) by mouth daily., Disp: 60 capsule, Rfl: 2 .  levothyroxine (SYNTHROID) 75 MCG tablet, Take 1 tablet (75 mcg total) by mouth daily before breakfast., Disp: 30 tablet, Rfl: 2 .  LORazepam (ATIVAN) 0.5 MG tablet, Take 1 tablet (0.5 mg total) by mouth every 6 (six) hours as needed for anxiety (and to help with breathing)., Disp: 120 tablet, Rfl: 0 .  Morphine Sulfate (MORPHINE CONCENTRATE) 10 mg / 0.5 ml concentrated solution, Take 0.25 mLs (5 mg total) by mouth every 6 (six) hours as needed for severe pain or shortness of breath., Disp: 30 mL, Rfl: 0 .  pantoprazole (PROTONIX) 20 MG tablet, Take 1 tablet (20 mg total) by mouth daily., Disp: 60 tablet, Rfl: 1 .  potassium chloride (KLOR-CON) 10 MEQ tablet, Take 1 tablet (10 mEq total) by mouth daily., Disp: 90 tablet, Rfl: 1 .  triamcinolone cream (KENALOG) 0.1 %, Apply 1 application topically 2 (two) times daily. 28.4 g tube, Disp: 30 g, Rfl: 0 .  losartan (COZAAR) 25 MG tablet, Take 0.5 tablets (12.5 mg total) by mouth daily., Disp: 15 tablet, Rfl: 2 No current facility-administered medications for this visit.  Facility-Administered Medications Ordered in Other Visits:  .  heparin lock flush 100 unit/mL, 500 Units, Intravenous, Once, Randa Evens C, MD .  sodium chloride flush (NS) 0.9 % injection 10 mL, 10 mL, Intravenous, PRN, Sindy Guadeloupe, MD, 10 mL at 12/07/19 0914 .  sodium chloride flush (NS) 0.9 % injection 10 mL, 10 mL, Intravenous, PRN, Sindy Guadeloupe, MD, 10 mL at 01/04/20 0850  Physical exam:  Vitals:   05/17/20 0909  BP: (!) 94/53  Pulse: 98  Resp: 16  Temp: 98.8 F (37.1 C)  TempSrc: Tympanic  SpO2: 98%    Weight: 152 lb 1.6 oz (69 kg)   Physical Exam Cardiovascular:     Rate and Rhythm:  Regular rhythm. Tachycardia present.     Heart sounds: Normal heart sounds.  Pulmonary:     Effort: Pulmonary effort is normal.  Abdominal:     General: Bowel sounds are normal.     Palpations: Abdomen is soft.  Skin:    General: Skin is warm and dry.  Neurological:     Mental Status: She is alert and oriented to person, place, and time.      CMP Latest Ref Rng & Units 05/17/2020  Glucose 70 - 99 mg/dL 125(H)  BUN 8 - 23 mg/dL 12  Creatinine 0.44 - 1.00 mg/dL 1.19(H)  Sodium 135 - 145 mmol/L 138  Potassium 3.5 - 5.1 mmol/L 3.4(L)  Chloride 98 - 111 mmol/L 101  CO2 22 - 32 mmol/L 23  Calcium 8.9 - 10.3 mg/dL 8.8(L)  Total Protein 6.5 - 8.1 g/dL 6.4(L)  Total Bilirubin 0.3 - 1.2 mg/dL 0.8  Alkaline Phos 38 - 126 U/L 76  AST 15 - 41 U/L 18  ALT 0 - 44 U/L 10   CBC Latest Ref Rng & Units 05/17/2020  WBC 4.0 - 10.5 K/uL 8.7  Hemoglobin 12.0 - 15.0 g/dL 9.6(L)  Hematocrit 36 - 46 % 30.4(L)  Platelets 150 - 400 K/uL 255     Assessment and plan- Patient is a 69 y.o. female with recurrent endometrial carcinoma with peritoneal metastases here for on treatment assessment prior to cycle 4 of Keytruda  Counts okay to proceed with cycle 4 of Keytruda today.  She will restart Lenvima 8 mg daily from next week onwards.CT chest abdomen pelvis is scheduled for 05/29/2020 and she will be getting this without contrast given her underlying kidney disease.  Patient reports having left-sided headache mainly over her scalp since she had a fall a while ago during radiation treatment.  CT head has been ordered for the same.  I will tentatively see her back in 3 weeks for next cycle of Keytruda and to discuss CT scan results and further management.  If she has disease progression on Keytruda, she will unlikely be able to tolerate any systemic chemotherapy.  We will discuss further options depending on what CT scan  show   Visit Diagnosis 1. Endometrial cancer ([place])   2. Encounter for antineoplastic immunotherapy      Dr. Randa Evens, MD, MPH [facility] [phone] 05/17/2020 12:46 PM

## 2020-05-17 NOTE — Progress Notes (Signed)
1137- Patient tolerated treatment well. Patient and vital signs stable. Patient discharged to home at this time.

## 2020-05-18 LAB — CA 125: Cancer Antigen (CA) 125: 73.9 U/mL — ABNORMAL HIGH (ref 0.0–38.1)

## 2020-05-19 ENCOUNTER — Encounter: Payer: Self-pay | Admitting: Oncology

## 2020-05-19 ENCOUNTER — Emergency Department: Payer: PPO

## 2020-05-19 ENCOUNTER — Other Ambulatory Visit: Payer: Self-pay

## 2020-05-19 ENCOUNTER — Inpatient Hospital Stay
Admission: EM | Admit: 2020-05-19 | Discharge: 2020-05-24 | DRG: 291 | Disposition: A | Discharge status: Home / Self Care | Payer: PPO | Attending: Internal Medicine | Admitting: Internal Medicine

## 2020-05-19 DIAGNOSIS — I5043 Acute on chronic combined systolic (congestive) and diastolic (congestive) heart failure: Secondary | ICD-10-CM | POA: Diagnosis present

## 2020-05-19 DIAGNOSIS — F419 Anxiety disorder, unspecified: Secondary | ICD-10-CM | POA: Diagnosis present

## 2020-05-19 DIAGNOSIS — F418 Other specified anxiety disorders: Secondary | ICD-10-CM

## 2020-05-19 DIAGNOSIS — E785 Hyperlipidemia, unspecified: Secondary | ICD-10-CM

## 2020-05-19 DIAGNOSIS — N183 Chronic kidney disease, stage 3 unspecified: Secondary | ICD-10-CM | POA: Diagnosis present

## 2020-05-19 DIAGNOSIS — N390 Urinary tract infection, site not specified: Secondary | ICD-10-CM | POA: Diagnosis present

## 2020-05-19 DIAGNOSIS — I2510 Atherosclerotic heart disease of native coronary artery without angina pectoris: Secondary | ICD-10-CM | POA: Diagnosis not present

## 2020-05-19 DIAGNOSIS — E039 Hypothyroidism, unspecified: Secondary | ICD-10-CM | POA: Diagnosis present

## 2020-05-19 DIAGNOSIS — C541 Malignant neoplasm of endometrium: Secondary | ICD-10-CM | POA: Diagnosis present

## 2020-05-19 DIAGNOSIS — Z20822 Contact with and (suspected) exposure to covid-19: Secondary | ICD-10-CM | POA: Diagnosis present

## 2020-05-19 DIAGNOSIS — Z955 Presence of coronary angioplasty implant and graft: Secondary | ICD-10-CM | POA: Diagnosis not present

## 2020-05-19 DIAGNOSIS — N179 Acute kidney failure, unspecified: Secondary | ICD-10-CM | POA: Diagnosis present

## 2020-05-19 DIAGNOSIS — I10 Essential (primary) hypertension: Secondary | ICD-10-CM | POA: Diagnosis present

## 2020-05-19 DIAGNOSIS — Z66 Do not resuscitate: Secondary | ICD-10-CM | POA: Diagnosis present

## 2020-05-19 DIAGNOSIS — R0602 Shortness of breath: Secondary | ICD-10-CM

## 2020-05-19 DIAGNOSIS — E282 Polycystic ovarian syndrome: Secondary | ICD-10-CM | POA: Diagnosis present

## 2020-05-19 DIAGNOSIS — M1990 Unspecified osteoarthritis, unspecified site: Secondary | ICD-10-CM | POA: Diagnosis present

## 2020-05-19 DIAGNOSIS — I471 Supraventricular tachycardia: Secondary | ICD-10-CM | POA: Diagnosis present

## 2020-05-19 DIAGNOSIS — Z882 Allergy status to sulfonamides status: Secondary | ICD-10-CM | POA: Diagnosis not present

## 2020-05-19 DIAGNOSIS — I2699 Other pulmonary embolism without acute cor pulmonale: Secondary | ICD-10-CM | POA: Diagnosis not present

## 2020-05-19 DIAGNOSIS — I25118 Atherosclerotic heart disease of native coronary artery with other forms of angina pectoris: Secondary | ICD-10-CM | POA: Diagnosis not present

## 2020-05-19 DIAGNOSIS — D649 Anemia, unspecified: Secondary | ICD-10-CM | POA: Diagnosis present

## 2020-05-19 DIAGNOSIS — N1831 Chronic kidney disease, stage 3a: Secondary | ICD-10-CM | POA: Diagnosis not present

## 2020-05-19 DIAGNOSIS — C8190 Hodgkin lymphoma, unspecified, unspecified site: Secondary | ICD-10-CM | POA: Diagnosis present

## 2020-05-19 DIAGNOSIS — Z888 Allergy status to other drugs, medicaments and biological substances status: Secondary | ICD-10-CM | POA: Diagnosis not present

## 2020-05-19 DIAGNOSIS — R042 Hemoptysis: Secondary | ICD-10-CM | POA: Diagnosis present

## 2020-05-19 DIAGNOSIS — I130 Hypertensive heart and chronic kidney disease with heart failure and stage 1 through stage 4 chronic kidney disease, or unspecified chronic kidney disease: Secondary | ICD-10-CM | POA: Diagnosis present

## 2020-05-19 DIAGNOSIS — K219 Gastro-esophageal reflux disease without esophagitis: Secondary | ICD-10-CM | POA: Diagnosis present

## 2020-05-19 DIAGNOSIS — Z8542 Personal history of malignant neoplasm of other parts of uterus: Secondary | ICD-10-CM | POA: Diagnosis not present

## 2020-05-19 DIAGNOSIS — D72829 Elevated white blood cell count, unspecified: Secondary | ICD-10-CM

## 2020-05-19 DIAGNOSIS — C786 Secondary malignant neoplasm of retroperitoneum and peritoneum: Secondary | ICD-10-CM | POA: Diagnosis present

## 2020-05-19 DIAGNOSIS — N1832 Chronic kidney disease, stage 3b: Secondary | ICD-10-CM | POA: Diagnosis present

## 2020-05-19 DIAGNOSIS — Z923 Personal history of irradiation: Secondary | ICD-10-CM | POA: Diagnosis not present

## 2020-05-19 DIAGNOSIS — Z90710 Acquired absence of both cervix and uterus: Secondary | ICD-10-CM | POA: Diagnosis not present

## 2020-05-19 DIAGNOSIS — I509 Heart failure, unspecified: Secondary | ICD-10-CM

## 2020-05-19 LAB — CBC
HCT: 33.3 % — ABNORMAL LOW (ref 36.0–46.0)
Hemoglobin: 10.7 g/dL — ABNORMAL LOW (ref 12.0–15.0)
MCH: 28.7 pg (ref 26.0–34.0)
MCHC: 32.1 g/dL (ref 30.0–36.0)
MCV: 89.3 fL (ref 80.0–100.0)
Platelets: 291 10*3/uL (ref 150–400)
RBC: 3.73 MIL/uL — ABNORMAL LOW (ref 3.87–5.11)
RDW: 15.4 % (ref 11.5–15.5)
WBC: 10.9 10*3/uL — ABNORMAL HIGH (ref 4.0–10.5)
nRBC: 0 % (ref 0.0–0.2)

## 2020-05-19 LAB — COMPREHENSIVE METABOLIC PANEL
ALT: 9 U/L (ref 0–44)
AST: 22 U/L (ref 15–41)
Albumin: 3.3 g/dL — ABNORMAL LOW (ref 3.5–5.0)
Alkaline Phosphatase: 81 U/L (ref 38–126)
Anion gap: 11 (ref 5–15)
BUN: 11 mg/dL (ref 8–23)
CO2: 25 mmol/L (ref 22–32)
Calcium: 9.1 mg/dL (ref 8.9–10.3)
Chloride: 102 mmol/L (ref 98–111)
Creatinine, Ser: 1.18 mg/dL — ABNORMAL HIGH (ref 0.44–1.00)
GFR, Estimated: 50 mL/min — ABNORMAL LOW (ref >60)
Glucose, Bld: 131 mg/dL — ABNORMAL HIGH (ref 70–99)
Potassium: 4.1 mmol/L (ref 3.5–5.1)
Sodium: 138 mmol/L (ref 135–145)
Total Bilirubin: 1.1 mg/dL (ref 0.3–1.2)
Total Protein: 6.8 g/dL (ref 6.5–8.1)

## 2020-05-19 LAB — RESP PANEL BY RT-PCR (FLU A&B, COVID) ARPGX2
Influenza A by PCR: NEGATIVE
Influenza B by PCR: NEGATIVE
SARS Coronavirus 2 by RT PCR: NEGATIVE

## 2020-05-19 LAB — BRAIN NATRIURETIC PEPTIDE: B Natriuretic Peptide: 1545.5 pg/mL — ABNORMAL HIGH (ref 0.0–100.0)

## 2020-05-19 LAB — TROPONIN I (HIGH SENSITIVITY): Troponin I (High Sensitivity): 14 ng/L (ref <18)

## 2020-05-19 MED ORDER — SODIUM CHLORIDE 0.9% FLUSH
3.0000 mL | Freq: Two times a day (BID) | INTRAVENOUS | Status: DC
  Administered 2020-05-19 – 2020-05-24 (×10): 3 mL via INTRAVENOUS

## 2020-05-19 MED ORDER — SODIUM CHLORIDE 0.9 % IV SOLN: 250.0000 mL | INTRAVENOUS | Status: DC | PRN

## 2020-05-19 MED ORDER — APIXABAN 2.5 MG PO TABS
2.5000 mg | ORAL_TABLET | Freq: Two times a day (BID) | ORAL | Status: DC
  Administered 2020-05-19 – 2020-05-24 (×10): 2.5 mg via ORAL
  Filled 2020-05-19 (×10): qty 1

## 2020-05-19 MED ORDER — FUROSEMIDE 10 MG/ML IJ SOLN
60.0000 mg | Freq: Once | INTRAMUSCULAR | Status: AC
  Administered 2020-05-19: 60 mg via INTRAVENOUS
  Filled 2020-05-19: qty 8

## 2020-05-19 MED ORDER — HYDRALAZINE HCL 20 MG/ML IJ SOLN: 5.0000 mg | INTRAMUSCULAR | Status: DC | PRN

## 2020-05-19 MED ORDER — SODIUM CHLORIDE 0.9% FLUSH: 3.0000 mL | INTRAVENOUS | Status: DC | PRN

## 2020-05-19 MED ORDER — LEVOTHYROXINE SODIUM 75 MCG PO TABS
75.0000 ug | ORAL_TABLET | Freq: Every day | ORAL | Status: DC
  Administered 2020-05-20 – 2020-05-24 (×5): 75 ug via ORAL
  Filled 2020-05-19 (×2): qty 1
  Filled 2020-05-19 (×2): qty 3
  Filled 2020-05-19 (×3): qty 1
  Filled 2020-05-19 (×3): qty 3

## 2020-05-19 MED ORDER — ALBUTEROL SULFATE HFA 108 (90 BASE) MCG/ACT IN AERS
2.0000 | INHALATION_SPRAY | RESPIRATORY_TRACT | Status: DC | PRN
  Filled 2020-05-19: qty 6.7

## 2020-05-19 MED ORDER — DM-GUAIFENESIN ER 30-600 MG PO TB12: 1.0000 | ORAL_TABLET | Freq: Two times a day (BID) | ORAL | Status: DC | PRN

## 2020-05-19 MED ORDER — PANTOPRAZOLE SODIUM 20 MG PO TBEC
20.0000 mg | DELAYED_RELEASE_TABLET | Freq: Every day | ORAL | Status: DC
  Administered 2020-05-20 – 2020-05-24 (×5): 20 mg via ORAL
  Filled 2020-05-19 (×6): qty 1

## 2020-05-19 MED ORDER — FUROSEMIDE 10 MG/ML IJ SOLN
40.0000 mg | Freq: Two times a day (BID) | INTRAMUSCULAR | Status: DC
  Administered 2020-05-19: 40 mg via INTRAVENOUS
  Filled 2020-05-19 (×2): qty 4

## 2020-05-19 MED ORDER — CARVEDILOL 3.125 MG PO TABS
3.1250 mg | ORAL_TABLET | Freq: Two times a day (BID) | ORAL | Status: DC
  Administered 2020-05-20 (×2): 3.125 mg via ORAL
  Filled 2020-05-19 (×3): qty 1

## 2020-05-19 MED ORDER — VITAMIN D 25 MCG (1000 UNIT) PO TABS
1000.0000 [IU] | ORAL_TABLET | Freq: Every day | ORAL | Status: DC
  Administered 2020-05-19 – 2020-05-24 (×6): 1000 [IU] via ORAL
  Filled 2020-05-19 (×7): qty 1

## 2020-05-19 MED ORDER — CEPHALEXIN 500 MG PO CAPS
500.0000 mg | ORAL_CAPSULE | Freq: Two times a day (BID) | ORAL | Status: DC
  Administered 2020-05-19: 500 mg via ORAL
  Filled 2020-05-19 (×2): qty 1

## 2020-05-19 MED ORDER — LORAZEPAM 0.5 MG PO TABS: 0.5000 mg | ORAL_TABLET | Freq: Four times a day (QID) | ORAL | Status: DC | PRN

## 2020-05-19 MED ORDER — ATORVASTATIN CALCIUM 10 MG PO TABS
10.0000 mg | ORAL_TABLET | Freq: Every evening | ORAL | Status: DC
  Administered 2020-05-19 – 2020-05-23 (×5): 10 mg via ORAL
  Filled 2020-05-19 (×5): qty 1

## 2020-05-19 MED ORDER — LOSARTAN POTASSIUM 25 MG PO TABS
12.5000 mg | ORAL_TABLET | Freq: Every day | ORAL | Status: DC | PRN
  Filled 2020-05-19: qty 0.5

## 2020-05-19 MED ORDER — HYDROXYZINE HCL 10 MG PO TABS
10.0000 mg | ORAL_TABLET | Freq: Three times a day (TID) | ORAL | Status: DC | PRN
  Filled 2020-05-19: qty 1

## 2020-05-19 MED ORDER — LEVOTHYROXINE SODIUM 50 MCG PO TABS: 75.0000 ug | ORAL_TABLET | Freq: Every day | ORAL | Status: DC

## 2020-05-19 MED ORDER — AMITRIPTYLINE HCL 25 MG PO TABS
25.0000 mg | ORAL_TABLET | Freq: Every day | ORAL | Status: DC
  Administered 2020-05-19 – 2020-05-23 (×5): 25 mg via ORAL
  Filled 2020-05-19 (×5): qty 1

## 2020-05-19 MED ORDER — ACETAMINOPHEN 325 MG PO TABS: 650.0000 mg | ORAL_TABLET | Freq: Four times a day (QID) | ORAL | Status: DC | PRN

## 2020-05-19 NOTE — ED Notes (Signed)
Secure chat sent to IP nurse

## 2020-05-19 NOTE — H&P (Addendum)
History and Physical    Jaime Hall EHM:[id number] DOB: 02-23-1951 DOA: 05/19/2020  Referring MD/NP/PA:   PCP: [name], MD   Patient coming from:  The patient is coming from home.  At baseline, pt is independent for most of ADL.        Chief Complaint: SOB  HPI: Jaime Hall is a 69 y.o. female with medical history significant of CHF with EF 30-35%, hypertension, hyperlipidemia, GERD, hypothyroidism, depression with anxiety, polycystic ovary disease, Hodgkin's lymphoma (s/p of radiation and chemotherapy), endometrial cancer with peritoneal carcinomatosis, (s/p for hysterectomy, currently on Lenvima), CAD with a stent placement, PE on Eliquis, PAD, who presents with shortness breath.  Patient states she has shortness breath in the past 2 days, which has has been progressively since last night. Patient has chest heaviness and dry cough. No fever or chills. Patient states she has oxygen to be used only as needed, but does not typically use oxygen normally.  She has oxygen saturation 93% on room air, currently on 2 L oxygen with 98% saturation. Patient has a worsening bilateral leg edema. Denies nausea, vomiting, diarrhea, abdominal pain. No unilateral numbness in his extremities. Of note, pt has been taking Keflex since 05/09/20 for UTI.  Denies any symptoms of UTI today.  ED Course: pt was found to have BNP 1545, troponin level 14, negative Covid PCR, renal function stable, blood pressure 121/77, heart rate 125, 109, RR 34, chest x-ray showed possible bilateral pulmonary edema.  Patient is admitted to progressive bed as inpatient.  Review of Systems:   General: no fevers, chills, no body weight gain, has fatigue HEENT: no blurry vision, hearing changes or sore throat Respiratory: has dyspnea, coughing, no wheezing CV: has chest heaviness, no palpitations GI: no nausea, vomiting, abdominal pain, diarrhea, constipation GU: no dysuria, burning on urination, increased urinary  frequency, hematuria  Ext: has leg edema Neuro: no unilateral weakness, numbness, or tingling, no vision change or hearing loss Skin: no rash, no skin tear. MSK: No muscle spasm, no deformity, no limitation of range of movement in spin Heme: No easy bruising.  Travel history: No recent long distant travel.  Allergy:  Allergies  Allergen Reactions  . Adhesive [Tape] Other (See Comments)    Skin Irritation   . Antifungal [Miconazole Nitrate] Rash  . Sulfa Antibiotics Nausea And Vomiting and Rash  . Z-Pak [Azithromycin] Itching    Past Medical History:  Diagnosis Date  . Anxiety   . Cervicalgia   . CHF (congestive heart failure) ([place])   . Chronic kidney disease   . Coronary artery disease    a. 02/2012 Stress echo: severe anterior wall ischemia;  b. 02/2012 Cath/PCI: LAD 95p (3.0 x 15 Xience EX DES), D1 90ost (PTCA - bifurcational dzs), EF 45% with anterior HK;  b. 02/2013 Ex MV: fixed anterior defect w/ minor reversibility, nl EF-->Med Rx.  . Endometrial cancer ([place])    a. 07/2016 s/p robotic hysterectomy, BSO w/ washings, sentinel node inj, mapping, bx, adhesiolysis.  . Essential hypertension, benign   . Fibrocystic breast disease   . GERD (gastroesophageal reflux disease)   . Gestational hypertension   . Heart murmur   . History of anemia   . History of blood transfusion   . Hodgkin's lymphoma ([place]) 2011   a. s/p radiation and chemo therapy  . Osteoarthritis   . Polycystic ovarian disease     Past Surgical History:  Procedure Laterality Date  . ABDOMINAL HYSTERECTOMY    .  bladder sling    . CARDIAC CATHETERIZATION  02/2012   [facility] 1 stent place  . CERVICAL POLYPECTOMY    . CHOLECYSTECTOMY  1982  . COLONOSCOPY WITH PROPOFOL N/A 02/05/2015   Procedure: COLONOSCOPY WITH PROPOFOL;  Surgeon: Lucilla Lame, MD;  Location: [facility] END[facility]COPY;  Service: Endoscopy;  Laterality: N/A;  . CORONARY ANGIOPLASTY  02/2012   left/right s/p balloon  . CYSTOGRAM N/A 08/17/2016   Procedure:  CYSTOGRAM;  Surgeon: Hollice Espy, MD;  Location: [facility] ORS;  Service: Urology;  Laterality: N/A;  . CYSTOSCOPY N/A 08/17/2016   Procedure: CYSTOSCOPY EXAM UNDER ANESTHESIA;  Surgeon: Hollice Espy, MD;  Location: [facility] ORS;  Service: Urology;  Laterality: N/A;  . CYSTOSCOPY W/ RETROGRADES Bilateral 08/17/2016   Procedure: CYSTOSCOPY WITH RETROGRADE PYELOGRAM;  Surgeon: Hollice Espy, MD;  Location: [facility] ORS;  Service: Urology;  Laterality: Bilateral;  . CYSTOSCOPY WITH STENT PLACEMENT Right 08/17/2016   Procedure: CYSTOSCOPY WITH STENT PLACEMENT;  Surgeon: Hollice Espy, MD;  Location: [facility] ORS;  Service: Urology;  Laterality: Right;  . heart stent'  2013  . kidney stent Right 2018  . LYMPH NODE BIOPSY  2011   diagnosis of hodgkins lymphoma  . PELVIC LYMPH NODE DISSECTION N/A 07/29/2016   Procedure: PELVIC/AORTIC LYMPH NODE SAMPLING;  Surgeon: [name], MD;  Location: [facility] ORS;  Service: Gynecology;  Laterality: N/A;  . PORTA CATH INSERTION N/A 09/22/2016   Procedure: Glori Luis Cath Insertion;  Surgeon: [name], MD;  Location: [place] [facility];  Service: Cardiovascular;  Laterality: N/A;  . PORTA CATH INSERTION N/A 10/27/2019   Procedure: PORTA CATH INSERTION;  Surgeon: [name], MD;  Location: [place] [facility];  Service: Cardiovascular;  Laterality: N/A;  . PORTA CATH REMOVAL N/A 11/17/2016   Procedure: Glori Luis Cath Removal;  Surgeon: [name], MD;  Location: [place] [facility];  Service: Cardiovascular;  Laterality: N/A;  . RIGHT/LEFT HEART CATH AND CORONARY ANGIOGRAPHY N/A 12/04/2019   Procedure: RIGHT/LEFT HEART CATH AND CORONARY ANGIOGRAPHY;  Surgeon: Wellington Hampshire, MD;  Location: [place] [facility];  Service: Cardiovascular;  Laterality: N/A;  . ROBOTIC ASSISTED TOTAL HYSTERECTOMY WITH BILATERAL SALPINGO OOPHERECTOMY N/A 07/29/2016   Procedure: ROBOTIC ASSISTED TOTAL HYSTERECTOMY WITH BILATERAL SALPINGO OOPHORECTOMY;  Surgeon: [name], MD;  Location: [facility];  Service: Gynecology;  Laterality: N/A;  . SENTINEL NODE BIOPSY N/A 07/29/2016   Procedure: SENTINEL NODE BIOPSY;  Surgeon: [name], MD;  Location: [facility];  Service: Gynecology;  Laterality: N/A;  . transobturator sling N/A 2009   [place]    Social History:  reports that she quit smoking about 17 years ago. Her smoking use included cigarettes. She has a 30.00 pack-year smoking history. She has never used smokeless tobacco. She reports previous alcohol use. She reports that she does not use drugs.  Family History:  Family History  Problem Relation Age of Onset  . ALS Father   . Polymyositis Father   . Diabetes Brother   . Cancer Maternal Aunt        breast  . Breast cancer Maternal Aunt        30's  . Stroke Maternal Grandmother   . Cancer Maternal Grandfather        prostate  . Stroke Maternal Grandfather   . Colon cancer Maternal Aunt   . Non-Hodgkin's lymphoma Cousin      Prior to Admission medications   Medication Sig Start Date End Date Taking? Authorizing Provider  amitriptyline (  ELAVIL) 25 MG tablet Take 1 tablet (25 mg total) by mouth at bedtime. May increase weekly as needed 04/02/20   [name], MD  apixaban (ELIQUIS) 2.5 MG TABS tablet Take 1 tablet (2.5 mg total) by mouth 2 (two) times daily. 05/01/20   Sindy Guadeloupe, MD  atorvastatin (LIPITOR) 10 MG tablet Take 1 tablet by mouth once daily 10/23/19   Wellington Hampshire, MD  carvedilol (COREG) 3.125 MG tablet Take 1 tablet (3.125 mg total) by mouth 2 (two) times daily with a meal. 04/04/20   Loel Dubonnet, NP  cephALEXin (KEFLEX) 500 MG capsule Take 1 capsule (500 mg total) by mouth 2 (two) times daily. 05/09/20   Hinda Kehr, MD  cholecalciferol (VITAMIN D3) 25 MCG (1000 UNIT) tablet Take 1,000 Units by mouth in the morning and at bedtime.    [provider]  cyanocobalamin (,VITAMIN B-12,) 1000 MCG/ML injection INJECT 1ML IM WEEKLY FOR 4 WEEKS,  THEN INJECT MONTHLY THEREAFTER Patient taking differently: Inject 1,000 mcg into the skin every 30 (thirty) days.  09/05/19   [name], MD  furosemide (LASIX) 20 MG tablet Take 1 tablet (20 mg total) by mouth daily. Patient taking differently: Take 20 mg by mouth as needed.  04/22/20   Wellington Hampshire, MD  lenvatinib 8 mg daily dose (LENVIMA, 8 MG DAILY DOSE,) 2 x 4 MG capsule Take 2 capsules (8 mg total) by mouth daily. 04/15/20   Sindy Guadeloupe, MD  levothyroxine (SYNTHROID) 75 MCG tablet Take 1 tablet (75 mcg total) by mouth daily before breakfast. 04/05/20   Sindy Guadeloupe, MD  LORazepam (ATIVAN) 0.5 MG tablet Take 1 tablet (0.5 mg total) by mouth every 6 (six) hours as needed for anxiety (and to help with breathing). 02/01/20   Sindy Guadeloupe, MD  losartan (COZAAR) 25 MG tablet Take 0.5 tablets (12.5 mg total) by mouth daily. 02/05/20 05/15/20  Loel Dubonnet, NP  Morphine Sulfate (MORPHINE CONCENTRATE) 10 mg / 0.5 ml concentrated solution Take 0.25 mLs (5 mg total) by mouth every 6 (six) hours as needed for severe pain or shortness of breath. 02/27/20   Sindy Guadeloupe, MD  pantoprazole (PROTONIX) 20 MG tablet Take 1 tablet (20 mg total) by mouth daily. 05/17/20   Sindy Guadeloupe, MD  potassium chloride (KLOR-CON) 10 MEQ tablet Take 1 tablet (10 mEq total) by mouth daily. 02/28/20   Loel Dubonnet, NP  triamcinolone cream (KENALOG) 0.1 % Apply 1 application topically 2 (two) times daily. 28.4 g tube 04/05/20   Sindy Guadeloupe, MD  prochlorperazine (COMPAZINE) 10 MG tablet Take 1 tablet (10 mg total) by mouth every 6 (six) hours as needed (Nausea or vomiting). Patient taking differently: Take 10 mg by mouth daily as needed for nausea or vomiting.  10/25/19 05/15/20  Sindy Guadeloupe, MD    Physical Exam: Vitals:   05/19/20 0934 05/19/20 1030 05/19/20 1100 05/19/20 1200  BP: 121/79 109/68 124/78 114/62  Pulse: 60 (!) 112 85 (!) 102  Resp: (!) 45 (!) 32 (!) 34 (!) 25  SpO2: 100% 99% 100% 100%    General: Not in acute distress HEENT:       Eyes: PERRL, EOMI, no scleral icterus.       ENT: No discharge from the ears and nose, no pharynx injection, no tonsillar enlargement.        Neck: Positive JVD, no bruit, no mass felt. Heme: No neck lymph node enlargement. Cardiac: S1/S2,  RRR, No gallops or rubs. Respiratory: Has fine crackles bilaterally GI: Soft, nondistended, nontender, no rebound pain, no organomegaly, BS present. GU: No hematuria Ext: has 1+ pitting leg edema bilaterally. 1+DP/PT pulse bilaterally. Musculoskeletal: No joint deformities, No joint redness or warmth, no limitation of ROM in spin. Skin: No rashes.  Neuro: Alert, oriented X3, cranial nerves II-XII grossly intact, moves all extremities normally. Psych: Patient is not psychotic, no suicidal or hemocidal ideation.  Labs on Admission: I have personally reviewed following labs and imaging studies  CBC: Recent Labs  Lab 05/17/20 0833 05/19/20 0709  WBC 8.7 10.9*  NEUTROABS 6.8  --   HGB 9.6* 10.7*  HCT 30.4* 33.3*  MCV 89.9 89.3  PLT 255 373   Basic Metabolic Panel: Recent Labs  Lab 05/17/20 0833 05/19/20 0709  NA 138 138  K 3.4* 4.1  CL 101 102  CO2 23 25  GLUCOSE 125* 131*  BUN 12 11  CREATININE 1.19* 1.18*  CALCIUM 8.8* 9.1   GFR: Estimated Creatinine Clearance: 39.4 mL/min (A) (by C-G formula based on SCr of 1.18 mg/dL (H)). Liver Function Tests: Recent Labs  Lab 05/17/20 0833 05/19/20 0709  AST 18 22  ALT 10 9  ALKPHOS 76 81  BILITOT 0.8 1.1  PROT 6.4* 6.8  ALBUMIN 3.2* 3.3*   No results for input(s): LIPASE, AMYLASE in the last 168 hours. No results for input(s): AMMONIA in the last 168 hours. Coagulation Profile: No results for input(s): INR, PROTIME in the last 168 hours. Cardiac Enzymes: No results for input(s): CKTOTAL, CKMB, CKMBINDEX, TROPONINI in the last 168 hours. BNP (last 3 results) No results for input(s): PROBNP in the last 8760 hours. HbA1C: No results  for input(s): HGBA1C in the last 72 hours. CBG: No results for input(s): GLUCAP in the last 168 hours. Lipid Profile: No results for input(s): CHOL, HDL, LDLCALC, TRIG, CHOLHDL, LDLDIRECT in the last 72 hours. Thyroid Function Tests: Recent Labs    05/17/20 0833  TSH 0.637   Anemia Panel: No results for input(s): VITAMINB12, FOLATE, FERRITIN, TIBC, IRON, RETICCTPCT in the last 72 hours. Urine analysis:    Component Value Date/Time   COLORURINE YELLOW (A) 05/09/2020 0031   APPEARANCEUR CLOUDY (A) 05/09/2020 0031   APPEARANCEUR Clear 09/24/2016 0945   LABSPEC 1.016 05/09/2020 0031   PHURINE 5.0 05/09/2020 0031   GLUCOSEU NEGATIVE 05/09/2020 0031   GLUCOSEU NEGATIVE 07/07/2016 1418   HGBUR SMALL (A) 05/09/2020 0031   BILIRUBINUR NEGATIVE 05/09/2020 0031   BILIRUBINUR negative 10/02/2019 1627   BILIRUBINUR Negative 09/24/2016 0945   KETONESUR NEGATIVE 05/09/2020 0031   PROTEINUR 30 (A) 05/09/2020 0031   UROBILINOGEN 0.2 10/02/2019 1627   UROBILINOGEN 0.2 07/07/2016 1418   NITRITE NEGATIVE 05/09/2020 0031   LEUKOCYTESUR LARGE (A) 05/09/2020 0031   Sepsis Labs: @LABRCNTIP (procalcitonin:4,lacticidven:4) ) Recent Results (from the past 240 hour(s))  Resp Panel by RT-PCR (Flu A&B, Covid) Nasopharyngeal Swab     Status: None   Collection Time: 05/19/20  7:41 AM   Specimen: Nasopharyngeal Swab; Nasopharyngeal(NP) swabs in vial transport medium  Result Value Ref Range Status   SARS Coronavirus 2 by RT PCR NEGATIVE NEGATIVE Final    Comment: (NOTE) SARS-CoV-2 target nucleic acids are NOT DETECTED.  The SARS-CoV-2 RNA is generally detectable in upper respiratory specimens during the acute phase of infection. The lowest concentration of SARS-CoV-2 viral copies this assay can detect is 138 copies/mL. A negative result does not preclude SARS-Cov-2 infection and should not be used as the sole  basis for treatment or other patient management decisions. A negative result may occur with   improper specimen collection/handling, submission of specimen other than nasopharyngeal swab, presence of viral mutation(s) within the areas targeted by this assay, and inadequate number of viral copies(<138 copies/mL). A negative result must be combined with clinical observations, patient history, and epidemiological information. The expected result is Negative.  Fact Sheet for Patients:  EntrepreneurPulse.com.au  Fact Sheet for Healthcare Providers:  IncredibleEmployment.be  This test is no t yet approved or cleared by the [place] FDA and  has been authorized for detection and/or diagnosis of SARS-CoV-2 by FDA under an Emergency Use Authorization (EUA). This EUA will remain  in effect (meaning this test can be used) for the duration of the COVID-19 declaration under Section 564(b)(1) of the Act, 21 U.S.C.section 360bbb-3(b)(1), unless the authorization is terminated  or revoked sooner.       Influenza A by PCR NEGATIVE NEGATIVE Final   Influenza B by PCR NEGATIVE NEGATIVE Final    Comment: (NOTE) The Xpert Xpress SARS-CoV-2/FLU/RSV plus assay is intended as an aid in the diagnosis of influenza from Nasopharyngeal swab specimens and should not be used as a sole basis for treatment. Nasal washings and aspirates are unacceptable for Xpert Xpress SARS-CoV-2/FLU/RSV testing.  Fact Sheet for Patients: EntrepreneurPulse.com.au  Fact Sheet for Healthcare Providers: IncredibleEmployment.be  This test is not yet approved or cleared by the [place] FDA and has been authorized for detection and/or diagnosis of SARS-CoV-2 by FDA under an Emergency Use Authorization (EUA). This EUA will remain in effect (meaning this test can be used) for the duration of the COVID-19 declaration under Section 564(b)(1) of the Act, 21 U.S.C. section 360bbb-3(b)(1), unless the authorization is terminated  or revoked.  Performed at [facility], [address], [place], [place] [address]      Radiological Exams on Admission: DG Chest Portable 1 View  Result Date: 05/19/2020 CLINICAL DATA:  Shortness of breath, chest heaviness. EXAM: PORTABLE CHEST 1 VIEW COMPARISON:  Chest x-rays dated 02/19/2020 and 02/13/2020. FINDINGS: Heart size and mediastinal contours are stable. RIGHT chest wall Port-A-Cath is stable in position. The hazy opacities within each lung are stable compared to the most recent chest x-ray of 02/19/2020, but new compared to the earlier study of 02/13/2020. No new lung findings. No pleural effusion or pneumothorax is seen. Osseous structures about the chest are unremarkable. IMPRESSION: 1. Persistent hazy opacities within each lung, stable compared to the most recent chest x-ray of 02/19/2020, but new compared to chest x-ray of 02/13/2020. Differential includes persistent/recurrent pulmonary edema, interstitial pneumonia and hypersensitivity pneumonitis. 2. No new lung findings. Electronically Signed   By: Franki Cabot M.D.   On: 05/19/2020 08:32     EKG: I have personally reviewed.  Poor quality of EKG strips, seems to be sinus rhythm, QTC 521, tachycardia, ST depression in inferior leads, V5-V6, low voltage, frequent PAC.    Assessment/Plan Principal Problem:   Acute on chronic combined systolic and diastolic CHF (congestive heart failure) (H[facility]) Active Problems:   Coronary artery disease   Endometrial cancer ([facility])   Normocytic anemia   Leukocytosis   HTN (hypertension)   HLD (hyperlipidemia)   CKD (chronic kidney disease), stage IIIa   Depression with anxiety   Hypothyroidism   PE (pulmonary thromboembolism) (HCC)   GERD (gastroesophageal reflux disease)   UTI (urinary tract infection)   Acute on chronic combined systolic and diastolic CHF (congestive heart failure) [facility]): Patient has 1+ leg  edema, positive JVD, crackles on auscultation, elevated BNP 1545,  chest x-ray showed bilateral pulmonary edema, clinically consistent with CHF exacerbation   -Will admit to progressive unit as inpatient -Lasix 40 mg bid by IV (patient received 60 mg of Lasix in ED) -2d echo -Daily weights -strict I/O's -Low salt diet -Fluid restriction -Obtain REDs Vest reading  Coronary artery disease: No CP -Continue Lipitor and Coreg  Endometrial cancer ([place]): With peritoneal carcinomatosis. Patient was started Lenvima on 11/1, but the patient is currently not taking this medication. -f/u with oncology  Normocytic anemia: Hb stabel 10.7 -f/u with CBC  Leukocytosis and recent UTI: WBC 10.9. pt is on Keflex for UTI since 05/09/20. No symptoms of UTI today -Continue Keflex -get urinalysis and urine culture --> if negative, will d/c Abx -f/u by CBC  HTN (hypertension) -IV hydralazine as needed -Coreg, Cozaar -Patient is on IV Lasix  HLD (hyperlipidemia) -Lipitor  CKD (chronic kidney disease), stage IIIa: stable -Follow-up by B[facility]  Depression with anxiety -Amitriptyline HPI abdomen  Hypothyroidism -Synthroid  PE (pulmonary thromboembolism) (HCC) -Continue Eliquis  GERD (gastroesophageal reflux disease) -Protonix         DVT ppx: on Eliquis Code Status: DNR per patient, which is confirmed by her daughter-in-law by phone Family Communication:  Yes, patient's granddaughter at bed side. I also called her daughter-in-law by phone Disposition Plan:  Anticipate discharge back to previous environment Consults called:  none Admission status:  progressive unit  as inpt      Status is: Inpatient  Remains inpatient appropriate because:Inpatient level of care appropriate due to severity of illness.  Patient has multiple comorbidities, now presents with acute on chronic diastolic and systolic CHF exacerbation.  Given her low EF 30-35% and multiple comorbidities, patient is at high risk of deterioration.  Patient will need to be treated in hospital  for at least 2 days.   Dispo: The patient is from: Home              Anticipated d/c is to: Home              Anticipated d/c date is: 2 days              Patient currently is not medically stable to d/c.          Date of Service 05/19/2020    Ivor Costa [facility]   If 7PM-7AM, please contact night-coverage [email or web address] 05/19/2020, 1:16 PM

## 2020-05-19 NOTE — ED Notes (Signed)
900 ml of straw color urine emptied from suction container

## 2020-05-19 NOTE — ED Triage Notes (Signed)
Pt BIB EMS from home, endorses shortness of breath and chest heaviness since last night after taking chemo medications. Pt uses 2L nasal as need but needed 2L all night without improvement. Also endorses bilateral leg and abdominal swelling. Pt in Afib with rate 107-120 with EMS.

## 2020-05-19 NOTE — ED Provider Notes (Signed)
[facility] Emergency Department Provider Note  Time seen: 7:13 AM  I have reviewed the triage vital signs and the nursing notes.   HISTORY  Chief Complaint Shortness of Breath   HPI Jaime Hall is a 69 y.o. female with a past medical history of anxiety, CHF, CKD, CAD status post stents, endometrial cancer, hypertension, presents to the emergency department for shortness of breath.  According to the patient over the past 2 days or so she has been experiencing progressively worsening shortness of breath that became acutely worse last night around 11 PM.  Patient also endorses chest heaviness mild in severity.  Denies any nausea vomiting fever.  No recent increased cough.  Has been vaccinated against Covid.  Patient states she has oxygen to be used only as needed, but does not typically use oxygen.   Past Medical History:  Diagnosis Date  . Anxiety   . Cervicalgia   . CHF (congestive heart failure) ([place])   . Chronic kidney disease   . Coronary artery disease    a. 02/2012 Stress echo: severe anterior wall ischemia;  b. 02/2012 Cath/PCI: LAD 95p (3.0 x 15 Xience EX DES), D1 90ost (PTCA - bifurcational dzs), EF 45% with anterior HK;  b. 02/2013 Ex MV: fixed anterior defect w/ minor reversibility, nl EF-->Med Rx.  . Endometrial cancer ([place])    a. 07/2016 s/p robotic hysterectomy, BSO w/ washings, sentinel node inj, mapping, bx, adhesiolysis.  . Essential hypertension, benign   . Fibrocystic breast disease   . GERD (gastroesophageal reflux disease)   . Gestational hypertension   . Heart murmur   . History of anemia   . History of blood transfusion   . Hodgkin's lymphoma ([place]) 2011   a. s/p radiation and chemo therapy  . Osteoarthritis   . Polycystic ovarian disease     Patient Active Problem List   Diagnosis Date Noted  . Hospital discharge follow-up 03/02/2020  . Hemoptysis 02/20/2020  . Acute on chronic congestive heart failure ([place])   . Palliative care  encounter   . Acute pulmonary embolism ([place]) 02/14/2020  . Thrombocytopenia ([place]) 02/14/2020  . Hypothyroidism 02/14/2020  . Chest pain 12/29/2019  . Leukocytosis 12/29/2019  . HTN (hypertension) 12/29/2019  . HLD (hyperlipidemia) 12/29/2019  . Elevated troponin 12/29/2019  . CKD (chronic kidney disease), stage IIIa 12/29/2019  . Depression with anxiety 12/29/2019  . Abnormal cardiovascular stress test   . Dyspnea on exertion   . Goals of care, counseling/discussion 10/20/2019  . 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 Peritoneal metastases ([place]) 10/16/2019  . Right anterior knee pain 07/25/2019  . Educated about COVID-19 virus infection 11/15/2018  . Cramps, muscle, general 04/06/2018  . B12 deficiency anemia 07/24/2017  . Normocytic anemia 07/18/2017  . Back pain 07/18/2017  . Chemotherapy-induced peripheral neuropathy ([place]) 12/10/2016  . Hyperlipidemia 08/13/2016  . Hx of colonic polyps 08/13/2016  . Benign neoplasm of sigmoid colon 08/13/2016  . Endometrial cancer ([place]) 08/13/2016  . 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 Lymphedema 07/20/2016  . Chronic venous insufficiency 07/20/2016  . PAD (peripheral artery disease) ([place]) 07/20/2016  . Class 1 obesity due to excess calories without serious comorbidity with body mass index (BMI) of 34.0 to 34.9 in adult 05/27/2016  . Leg cramps 05/27/2016  . Bronchiectasis ([place]) 12/19/2015  . Pre-syncope 12/19/2015  . Prolapsed, uterovaginal, incomplete 06/24/2014  . Routine general medical examination at a health care facility 12/30/2012  . Obesity (BMI 30-39.9)  12/30/2012  . Hx of multiple pulmonary nodules 12/28/2012  . Plantar fasciitis of right foot 12/28/2012  . Hip pain, bilateral 09/12/2012  . History of Hodgkin's lymphoma 09/12/2012  . Coronary artery disease   . Chest pain on exertion 02/29/2012    Past Surgical History:  Procedure Laterality Date  . ABDOMINAL HYSTERECTOMY    .  bladder sling    . CARDIAC CATHETERIZATION  02/2012   [facility] 1 stent place  . CERVICAL POLYPECTOMY    . CHOLECYSTECTOMY  1982  . COLONOSCOPY WITH PROPOFOL N/A 02/05/2015   Procedure: COLONOSCOPY WITH PROPOFOL;  Surgeon: Lucilla Lame, MD;  Location: [facility] ENDOSCOPY;  Service: Endoscopy;  Laterality: N/A;  . CORONARY ANGIOPLASTY  02/2012   left/right s/p balloon  . CYSTOGRAM N/A 08/17/2016   Procedure: CYSTOGRAM;  Surgeon: Hollice Espy, MD;  Location: [facility] ORS;  Service: Urology;  Laterality: N/A;  . CYSTOSCOPY N/A 08/17/2016   Procedure: CYSTOSCOPY EXAM UNDER ANESTHESIA;  Surgeon: Hollice Espy, MD;  Location: [facility] ORS;  Service: Urology;  Laterality: N/A;  . CYSTOSCOPY W/ RETROGRADES Bilateral 08/17/2016   Procedure: CYSTOSCOPY WITH RETROGRADE PYELOGRAM;  Surgeon: Hollice Espy, MD;  Location: [facility] ORS;  Service: Urology;  Laterality: Bilateral;  . CYSTOSCOPY WITH STENT PLACEMENT Right 08/17/2016   Procedure: CYSTOSCOPY WITH STENT PLACEMENT;  Surgeon: Hollice Espy, MD;  Location: [facility] ORS;  Service: Urology;  Laterality: Right;  . heart stent'  2013  . kidney stent Right 2018  . LYMPH NODE BIOPSY  2011   diagnosis of hodgkins lymphoma  . PELVIC LYMPH NODE DISSECTION N/A 07/29/2016   Procedure: PELVIC/AORTIC LYMPH NODE SAMPLING;  Surgeon: [name], MD;  Location: [facility] ORS;  Service: Gynecology;  Laterality: N/A;  . PORTA CATH INSERTION N/A 09/22/2016   Procedure: Glori Luis Cath Insertion;  Surgeon: [name], MD;  Location: [place] [facility];  Service: Cardiovascular;  Laterality: N/A;  . PORTA CATH INSERTION N/A 10/27/2019   Procedure: PORTA CATH INSERTION;  Surgeon: [name], MD;  Location: [place] [facility];  Service: Cardiovascular;  Laterality: N/A;  . PORTA CATH REMOVAL N/A 11/17/2016   Procedure: Glori Luis Cath Removal;  Surgeon: [name], MD;  Location: [place] [facility];  Service: Cardiovascular;  Laterality: N/A;  . RIGHT/LEFT HEART CATH AND CORONARY  ANGIOGRAPHY N/A 12/04/2019   Procedure: RIGHT/LEFT HEART CATH AND CORONARY ANGIOGRAPHY;  Surgeon: Wellington Hampshire, MD;  Location: [place] [facility];  Service: Cardiovascular;  Laterality: N/A;  . ROBOTIC ASSISTED TOTAL HYSTERECTOMY WITH BILATERAL SALPINGO OOPHERECTOMY N/A 07/29/2016   Procedure: ROBOTIC ASSISTED TOTAL HYSTERECTOMY WITH BILATERAL SALPINGO OOPHORECTOMY;  Surgeon: [name], MD;  Location: [facility] ORS;  Service: Gynecology;  Laterality: N/A;  . SENTINEL NODE BIOPSY N/A 07/29/2016   Procedure: SENTINEL NODE BIOPSY;  Surgeon: [name], MD;  Location: [facility] ORS;  Service: Gynecology;  Laterality: N/A;  . transobturator sling N/A 2009   [place]    Prior to Admission medications   Medication Sig Start Date End Date Taking? Authorizing Provider  amitriptyline (ELAVIL) 25 MG tablet Take 1 tablet (25 mg total) by mouth at bedtime. May increase weekly as needed 04/02/20   [name], MD  apixaban (ELIQUIS) 2.5 MG TABS tablet Take 1 tablet (2.5 mg total) by mouth 2 (two) times daily. 05/01/20   Sindy Guadeloupe, MD  atorvastatin (LIPITOR) 10 MG tablet Take 1 tablet by mouth once daily 10/23/19   Wellington Hampshire, MD  carvedilol (COREG) 3.125 MG tablet  Take 1 tablet (3.125 mg total) by mouth 2 (two) times daily with a meal. 04/04/20   Loel Dubonnet, NP  cephALEXin (KEFLEX) 500 MG capsule Take 1 capsule (500 mg total) by mouth 2 (two) times daily. 05/09/20   Hinda Kehr, MD  cholecalciferol (VITAMIN D3) 25 MCG (1000 UNIT) tablet Take 1,000 Units by mouth in the morning and at bedtime.    [provider]  cyanocobalamin (,VITAMIN B-12,) 1000 MCG/ML injection INJECT 1ML IM WEEKLY FOR 4 WEEKS, THEN INJECT MONTHLY THEREAFTER Patient taking differently: Inject 1,000 mcg into the skin every 30 (thirty) days.  09/05/19   [name], MD  furosemide (LASIX) 20 MG tablet Take 1 tablet (20 mg total) by mouth daily. Patient taking differently: Take 20 mg  by mouth as needed.  04/22/20   Wellington Hampshire, MD  lenvatinib 8 mg daily dose (LENVIMA, 8 MG DAILY DOSE,) 2 x 4 MG capsule Take 2 capsules (8 mg total) by mouth daily. 04/15/20   Sindy Guadeloupe, MD  levothyroxine (SYNTHROID) 75 MCG tablet Take 1 tablet (75 mcg total) by mouth daily before breakfast. 04/05/20   Sindy Guadeloupe, MD  LORazepam (ATIVAN) 0.5 MG tablet Take 1 tablet (0.5 mg total) by mouth every 6 (six) hours as needed for anxiety (and to help with breathing). 02/01/20   Sindy Guadeloupe, MD  losartan (COZAAR) 25 MG tablet Take 0.5 tablets (12.5 mg total) by mouth daily. 02/05/20 05/15/20  Loel Dubonnet, NP  Morphine Sulfate (MORPHINE CONCENTRATE) 10 mg / 0.5 ml concentrated solution Take 0.25 mLs (5 mg total) by mouth every 6 (six) hours as needed for severe pain or shortness of breath. 02/27/20   Sindy Guadeloupe, MD  pantoprazole (PROTONIX) 20 MG tablet Take 1 tablet (20 mg total) by mouth daily. 05/17/20   Sindy Guadeloupe, MD  potassium chloride (KLOR-CON) 10 MEQ tablet Take 1 tablet (10 mEq total) by mouth daily. 02/28/20   Loel Dubonnet, NP  triamcinolone cream (KENALOG) 0.1 % Apply 1 application topically 2 (two) times daily. 28.4 g tube 04/05/20   Sindy Guadeloupe, MD  prochlorperazine (COMPAZINE) 10 MG tablet Take 1 tablet (10 mg total) by mouth every 6 (six) hours as needed (Nausea or vomiting). Patient taking differently: Take 10 mg by mouth daily as needed for nausea or vomiting.  10/25/19 05/15/20  Sindy Guadeloupe, MD    Allergies  Allergen Reactions  . Adhesive [Tape] Other (See Comments)    Skin Irritation   . Antifungal [Miconazole Nitrate] Rash  . Sulfa Antibiotics Nausea And Vomiting and Rash  . Z-Pak [Azithromycin] Itching    Family History  Problem Relation Age of Onset  . ALS Father   . Polymyositis Father   . Diabetes Brother   . Cancer Maternal Aunt        breast  . Breast cancer Maternal Aunt        30's  . Stroke Maternal Grandmother   . Cancer Maternal  Grandfather        prostate  . Stroke Maternal Grandfather   . Colon cancer Maternal Aunt   . Non-Hodgkin's lymphoma Cousin     Social History Social History   Tobacco Use  . Smoking status: Former Smoker    Packs/day: 1.00    Years: 30.00    Pack years: 30.00    Types: Cigarettes    Quit date: 07/24/2002    Years since quitting: 17.8  . Smokeless tobacco: Never  Used  . Tobacco comment: quit smoking in 2000  Vaping Use  . Vaping Use: Never used  Substance Use Topics  . Alcohol use: Not Currently  . Drug use: No    Review of Systems Constitutional: Negative for fever. Cardiovascular: Mild chest heaviness since last night Respiratory: Progressively worsening shortness of breath over the past 2 to 3 days acutely worse last night. Gastrointestinal: Negative for abdominal pain, vomiting and diarrhea. Musculoskeletal: Negative for swelling. Neurological: Negative for headache All other ROS negative  ____________________________________________   PHYSICAL EXAM:  VITAL SIGNS: ED Triage Vitals  Enc Vitals Group     BP 05/19/20 0708 (!) 116/99     Pulse Rate 05/19/20 0708 (!) 115     Resp 05/19/20 0708 16     Temp --      Temp src --      SpO2 05/19/20 0708 93 %     Weight --      Height --      Head Circumference --      Peak Flow --      Pain Score 05/19/20 0709 0     Pain Loc --      Pain Edu? --      Excl. in [place]? --    Constitutional: Alert and oriented. Well appearing and in no distress. Eyes: Normal exam ENT      Head: Normocephalic and atraumatic.      Mouth/Throat: Mucous membranes are moist. Cardiovascular: Normal rate, regular rhythm.  Respiratory: Normal respiratory effort without tachypnea nor retractions. Breath sounds are clear Gastrointestinal: Soft and nontender. No distention. Musculoskeletal: Nontender with normal range of motion in all extremities.  Neurologic:  Normal speech and language. No gross focal neurologic deficits  Skin:  Skin is  warm, dry and intact.  Psychiatric: Mood and affect are normal.   ____________________________________________    EKG  EKG viewed and interpreted by myself shows what appears to be sinus tachycardia 115 bpm with a widened QRS, normal axis, slight QTC prolongation otherwise normal intervals with nonspecific ST changes.  ____________________________________________    RADIOLOGY  Chest x-ray shows persistent hazy opacities bilaterally consistent with recurrent edema.  ____________________________________________   INITIAL IMPRESSION / ASSESSMENT AND PLAN / ED COURSE  Pertinent labs & imaging results that were available during my care of the patient were reviewed by me and considered in my medical decision making (see chart for details).   Patient presents emergency department for shortness of breath.  Patient is in mild respiratory distress sitting upright in the bed with moderate tachypnea mild tachycardia.  Is able to speak in 3-4 word sentences.  States mild chest heaviness.  I reviewed the patient's chart appears to be currently on oral chemotherapy.  Has a history of PE, now possibly off anticoagulation due to hemoptysis.  We will check labs, obtain a chest x-ray, Covid swab and continue to closely monitor.  Patient will likely require a CTA of the chest if she is able.  Differential would include CHF exacerbation, pulmonary edema, infectious etiology, PE.  Pulmonary edema.  BNP of 1500.  We will dose IV Lasix.  Patient will be admitted to the hospital service for further work-up and treatment.   Jaime Hall was evaluated in Emergency Department on 05/19/2020 for the symptoms described in the history of present illness. She was evaluated in the context of the global COVID-19 pandemic, which necessitated consideration that the patient might be at risk for infection with the SARS-CoV-2 virus  that causes COVID-19. Institutional protocols and algorithms that pertain to the evaluation  of patients at risk for COVID-19 are in a state of rapid change based on information released by regulatory bodies including the CDC and federal and state organizations. These policies and algorithms were followed during the patient's care in the ED.  ____________________________________________   FINAL CLINICAL IMPRESSION(S) / ED DIAGNOSES  Chest pain Dyspnea CHF   Harvest Dark, MD 05/19/20 1210

## 2020-05-19 NOTE — ED Notes (Signed)
Purewick placed for lasix administration

## 2020-05-19 NOTE — ED Notes (Addendum)
Pt presents to ED with c/o of increasing SOB and chest heaviness that started last night. Pt states she has not been able to sleep "all night" due to SOB and chest discomfort. Pt chronically on O2 PRN but states has been having to keep it on since last night due to SOB, pt usually on 2L/min at home. Pt states was seen here on Thanksgiving and recently DX'ed with Afib, pt states she started a blood thinner. Pt states currently taking chemo medication for endometrial cancer. Pt does have increased work of breathing, using accessory muscles and having difficulty speaking in full sentences without dyspnea. Pt has fine crackles throughout all posterior lobes. Pt is A&Ox4.

## 2020-05-19 NOTE — ED Notes (Signed)
Medication Reconciliation Report  For Home History Technicians  HIGHLIGHTS:  1. The patient WAS personally interviewed 2. If not, what was the main source used: NOT APPLICABLE 3. Does the patient appear to take any anti-coagulation agents (e.g. warfarin, Eliquis or Xarelto): YES 4. Does the patient appear to take any anti-convulsant agents (e.g. divalproex, levetiracetam or phenytoin): NO 5. Does the patient appear to use any insulin products (e.g. Lantus, Novolin or Humalog): NO 6. Does the patient appear to take any "beta-blockers" (e.g. metoprolol, carvedilol or bisoprolol: YES  BARRIERS:  1. Were there any barriers that prevented or complicated the medication reconciliation process: NO 2. If yes, what was the primary barrier encountered: None 3. Does the patient appear compliant with prescribed medications: YES 4. Does the patient express any barriers with compliance: NO 5. What is the primary barrier the patient reports: None   NOTES:[Include any concerns, remarks or complaints the patient expresses regarding medication therapy. Any observations or other information that might be useful to the treatment team can also be included. Immediate needs or concerns should be referred to the RN or appropriate member of the treatment team.]  The patient was interviewed and verbalized a preference to not continue taking [place] as she associates the medication with her current condition. Patient also reports DULOXETINE being discontinued and replaced with AMITRIPTYLINE.   Colen Darling, CPhT [place] at [facility] [place]. [place], [place] [address] [phone]  ** The above is intended solely for informational and/or communicative purposes. It should in no way be considered an endorsement of any specific treatment, therapy or action. **

## 2020-05-19 NOTE — ED Notes (Signed)
Advised nurse that patient has assigned bed

## 2020-05-20 ENCOUNTER — Inpatient Hospital Stay
Admit: 2020-05-20 | Discharge: 2020-05-20 | Disposition: A | Discharge status: Home / Self Care | Payer: PPO | Attending: Internal Medicine | Admitting: Internal Medicine

## 2020-05-20 DIAGNOSIS — I10 Essential (primary) hypertension: Secondary | ICD-10-CM

## 2020-05-20 DIAGNOSIS — I5021 Acute systolic (congestive) heart failure: Secondary | ICD-10-CM | POA: Diagnosis not present

## 2020-05-20 DIAGNOSIS — I2510 Atherosclerotic heart disease of native coronary artery without angina pectoris: Secondary | ICD-10-CM

## 2020-05-20 DIAGNOSIS — I5043 Acute on chronic combined systolic (congestive) and diastolic (congestive) heart failure: Secondary | ICD-10-CM

## 2020-05-20 LAB — BASIC METABOLIC PANEL
Anion gap: 11 (ref 5–15)
BUN: 20 mg/dL (ref 8–23)
CO2: 29 mmol/L (ref 22–32)
Calcium: 8.9 mg/dL (ref 8.9–10.3)
Chloride: 97 mmol/L — ABNORMAL LOW (ref 98–111)
Creatinine, Ser: 1.52 mg/dL — ABNORMAL HIGH (ref 0.44–1.00)
GFR, Estimated: 37 mL/min — ABNORMAL LOW (ref >60)
Glucose, Bld: 109 mg/dL — ABNORMAL HIGH (ref 70–99)
Potassium: 3.6 mmol/L (ref 3.5–5.1)
Sodium: 137 mmol/L (ref 135–145)

## 2020-05-20 LAB — ECHOCARDIOGRAM COMPLETE
AR max vel: 1.27 cm2
AV Area VTI: 1.44 cm2
AV Area mean vel: 1.09 cm2
AV Mean grad: 4.7 mmHg
AV Peak grad: 8.1 mmHg
Ao pk vel: 1.42 m/s
Area-P 1/2: 4.31 cm2
Calc EF: 29.5 %
Height: 61.75 in
P 1/2 time: 562 msec
S' Lateral: 4.98 cm
Single Plane A2C EF: 37.4 %
Single Plane A4C EF: 18.4 %
Weight: 2374.4 oz

## 2020-05-20 LAB — CBC
HCT: 31.4 % — ABNORMAL LOW (ref 36.0–46.0)
Hemoglobin: 10.5 g/dL — ABNORMAL LOW (ref 12.0–15.0)
MCH: 28.5 pg (ref 26.0–34.0)
MCHC: 33.4 g/dL (ref 30.0–36.0)
MCV: 85.1 fL (ref 80.0–100.0)
Platelets: 240 10*3/uL (ref 150–400)
RBC: 3.69 MIL/uL — ABNORMAL LOW (ref 3.87–5.11)
RDW: 15.3 % (ref 11.5–15.5)
WBC: 7.9 10*3/uL (ref 4.0–10.5)
nRBC: 0 % (ref 0.0–0.2)

## 2020-05-20 LAB — MAGNESIUM: Magnesium: 1.7 mg/dL (ref 1.7–2.4)

## 2020-05-20 MED ORDER — IVABRADINE HCL 5 MG PO TABS
5.0000 mg | ORAL_TABLET | Freq: Two times a day (BID) | ORAL | Status: DC
  Administered 2020-05-20 – 2020-05-24 (×7): 5 mg via ORAL
  Filled 2020-05-20 (×9): qty 1

## 2020-05-20 MED ORDER — FUROSEMIDE 10 MG/ML IJ SOLN
20.0000 mg | Freq: Two times a day (BID) | INTRAMUSCULAR | Status: DC
  Administered 2020-05-20 (×2): 20 mg via INTRAVENOUS
  Filled 2020-05-20 (×2): qty 2

## 2020-05-20 NOTE — Progress Notes (Signed)
[facility] [place] [facility] [place] NAME: Jaime Hall    MR#:  [id number]  DATE OF BIRTH:  [birth year]  SUBJECTIVE:  patient was admitted with chest tightness/heaviness and increasing leg swelling with shortness of breath. She has been taking her Lasix as needed as per recommendation by her oncologist since she is getting chemo for her endometrial cancer to preserve renal function. Feels better. Received IV Lasix. Denies chest tightness. Able to complete sentence without difficulty.  REVIEW OF SYSTEMS:   Review of Systems  Constitutional: Negative for chills, fever and weight loss.  HENT: Negative for ear discharge, ear pain and nosebleeds.   Eyes: Negative for blurred vision, pain and discharge.  Respiratory: Positive for shortness of breath. Negative for sputum production, wheezing and stridor.   Cardiovascular: Positive for leg swelling. Negative for palpitations, orthopnea and PND.  Gastrointestinal: Negative for abdominal pain, diarrhea, nausea and vomiting.  Genitourinary: Negative for frequency and urgency.  Musculoskeletal: Negative for back pain and joint pain.  Neurological: Positive for weakness. Negative for sensory change, speech change and focal weakness.  Psychiatric/Behavioral: Negative for depression and hallucinations. The patient is not nervous/anxious.    Tolerating Diet:yes Tolerating PT:   DRUG ALLERGIES:   Allergies  Allergen Reactions  . Adhesive [Tape] Other (See Comments)    Skin Irritation   . Antifungal [Miconazole Nitrate] Rash  . Sulfa Antibiotics Nausea And Vomiting and Rash  . Z-Pak [Azithromycin] Itching    VITALS:  Blood pressure (!) 103/57, pulse 100, temperature 98 F (36.7 C), resp. rate 20, height 5' 1.75" (1.568 m), weight 67.3 kg, SpO2 96 %.  PHYSICAL EXAMINATION:   Physical Exam  GENERAL:  69 y.o.-year-old patient lying in the bed with no acute distress.  HEENT: Head atraumatic, normocephalic.  Oropharynx and nasopharynx clear.  LUNGS: Normal breath sounds bilaterally, no wheezing,few bibasilar rales, rhonchi. No use of accessory muscles of respiration.  CARDIOVASCULAR: S1, S2 normal. No murmurs, rubs, or gallops.  ABDOMEN: Soft, nontender, nondistended. Bowel sounds present. No organomegaly or mass.  EXTREMITIES: No cyanosis, clubbing  ++ edema b/l.    NEUROLOGIC: Cranial nerves II through XII are intact. No focal Motor or sensory deficits b/l.   PSYCHIATRIC:  patient is alert and oriented x 3.  SKIN: No obvious rash, lesion, or ulcer.   LABORATORY PANEL:  CBC Recent Labs  Lab 05/20/20 0608  WBC 7.9  HGB 10.5*  HCT 31.4*  PLT 240    Chemistries  Recent Labs  Lab 05/19/20 0709 05/19/20 0709 05/20/20 0608  NA 138   < > 137  K 4.1   < > 3.6  CL 102   < > 97*  CO2 25   < > 29  GLUCOSE 131*   < > 109*  BUN 11   < > 20  CREATININE 1.18*   < > 1.52*  CALCIUM 9.1   < > 8.9  MG  --   --  1.7  AST 22  --   --   ALT 9  --   --   ALKPHOS 81  --   --   BILITOT 1.1  --   --    < > = values in this interval not displayed.   Cardiac Enzymes No results for input(s): TROPONINI in the last 168 hours. RADIOLOGY:  DG Chest Portable 1 View  Result Date: 05/19/2020 CLINICAL DATA:  Shortness of breath, chest heaviness. EXAM: PORTABLE CHEST 1 VIEW COMPARISON:  Chest x-rays  dated 02/19/2020 and 02/13/2020. FINDINGS: Heart size and mediastinal contours are stable. RIGHT chest wall Port-A-Cath is stable in position. The hazy opacities within each lung are stable compared to the most recent chest x-ray of 02/19/2020, but new compared to the earlier study of 02/13/2020. No new lung findings. No pleural effusion or pneumothorax is seen. Osseous structures about the chest are unremarkable. IMPRESSION: 1. Persistent hazy opacities within each lung, stable compared to the most recent chest x-ray of 02/19/2020, but new compared to chest x-ray of 02/13/2020. Differential includes  persistent/recurrent pulmonary edema, interstitial pneumonia and hypersensitivity pneumonitis. 2. No new lung findings. Electronically Signed   By: Franki Cabot M.D.   On: 05/19/2020 08:32   ECHOCARDIOGRAM COMPLETE  Result Date: 05/20/2020    ECHOCARDIOGRAM REPORT   [name] Date of Exam: 05/20/2020 Medical Rec #:  [id number]         Height:       61.7 in Accession #:    [id number]        Weight:       148.4 lb Date of Birth:  09/17/1950         BSA:          1.679 m Patient Age:    22 years          BP:           98/54 mmHg Patient Gender: F                 HR:           59 bpm. Exam Location:  [facility] Procedure: 2D Echo, Cardiac Doppler and Color Doppler Indications:     CHF- acute systolic 938.18  History:         Patient has prior history of Echocardiogram examinations, most                  recent 12/29/2019. CHF, Signs/Symptoms:Murmur; Risk                  Factors:Hypertension.  Sonographer:     Sherrie Sport RDCS (AE) Referring Phys:  [id number] Soledad Gerlach NIU Diagnosing Phys: Bartholome Bill MD  Sonographer Comments: Global longitudinal strain was attempted. IMPRESSIONS  1. Left ventricular ejection fraction, by estimation, is <20%. The left ventricle has severely decreased function. The left ventricle demonstrates global hypokinesis. The left ventricular internal cavity size was mildly dilated. Left ventricular diastolic parameters were normal.  2. Right ventricular systolic function is normal. The right ventricular size is normal.  3. Left atrial size was mildly dilated.  4. The mitral valve was not well visualized. Mild mitral valve regurgitation.  5. The aortic valve was not well visualized. Aortic valve regurgitation is mild to moderate. Mild to moderate aortic valve sclerosis/calcification is present, without any evidence of aortic stenosis. FINDINGS  Left Ventricle: Left ventricular ejection fraction, by estimation, is <20%. The left ventricle has severely decreased function. The left  ventricle demonstrates global hypokinesis. The left ventricular internal cavity size was mildly dilated. There is no  left ventricular hypertrophy. Left ventricular diastolic parameters were normal. Right Ventricle: The right ventricular size is normal. No increase in right ventricular wall thickness. Right ventricular systolic function is normal. Left Atrium: Left atrial size was mildly dilated. Right Atrium: Right atrial size was normal in size. Pericardium: There is no evidence of pericardial effusion. Mitral Valve: The mitral valve was not well visualized. Mild mitral valve regurgitation. Tricuspid Valve: The tricuspid valve  is not well visualized. Tricuspid valve regurgitation is mild. Aortic Valve: The aortic valve was not well visualized. Aortic valve regurgitation is mild to moderate. Aortic regurgitation PHT measures 562 msec. Mild to moderate aortic valve sclerosis/calcification is present, without any evidence of aortic stenosis.  Aortic valve mean gradient measures 4.7 mmHg. Aortic valve peak gradient measures 8.1 mmHg. Aortic valve area, by VTI measures 1.44 cm. Pulmonic Valve: The pulmonic valve was not well visualized. Pulmonic valve regurgitation is trivial. Aorta: The aortic root is normal in size and structure. IAS/Shunts: The interatrial septum was not assessed.  LEFT VENTRICLE PLAX 2D LVIDd:         5.22 cm      Diastology LVIDs:         4.98 cm      LV e' medial:    3.92 cm/s LV PW:         1.16 cm      LV E/e' medial:  19.9 LV IVS:        0.88 cm      LV e' lateral:   5.33 cm/s LVOT diam:     2.00 cm      LV E/e' lateral: 14.7 LV SV:         33 LV SV Index:   20 LVOT Area:     3.14 cm  LV Volumes (MOD) LV vol d, MOD A2C: 110.0 ml LV vol d, MOD A4C: 91.7 ml LV vol s, MOD A2C: 68.9 ml LV vol s, MOD A4C: 74.8 ml LV SV MOD A2C:     41.1 ml LV SV MOD A4C:     91.7 ml LV SV MOD BP:      30.1 ml RIGHT VENTRICLE RV Basal diam:  2.25 cm RV S prime:     13.10 cm/s TAPSE (M-mode): 2.9 cm LEFT ATRIUM              Index       RIGHT ATRIUM           Index LA diam:        3.80 cm 2.26 cm/m  RA Area:     10.70 cm LA Vol (A2C):   66.0 ml 39.32 ml/m RA Volume:   22.80 ml  13.58 ml/m LA Vol (A4C):   48.2 ml 28.72 ml/m LA Biplane Vol: 57.9 ml 34.49 ml/m  AORTIC VALVE                   PULMONIC VALVE AV Area (Vmax):    1.27 cm    PV Vmax:        0.74 m/s AV Area (Vmean):   1.09 cm    PV Peak grad:   2.2 mmHg AV Area (VTI):     1.44 cm    RVOT Peak grad: 2 mmHg AV Vmax:           142.33 cm/s AV Vmean:          96.833 cm/s AV VTI:            0.231 m AV Peak Grad:      8.1 mmHg AV Mean Grad:      4.7 mmHg LVOT Vmax:         57.60 cm/s LVOT Vmean:        33.700 cm/s LVOT VTI:          0.106 m LVOT/AV VTI ratio: 0.46 AI PHT:  562 msec  AORTA Ao Root diam: 2.70 cm MITRAL VALVE               TRICUSPID VALVE MV Area (PHT): 4.31 cm    TR Peak grad:   17.3 mmHg MV Decel Time: 176 msec    TR Vmax:        208.00 cm/s MV E velocity: 78.10 cm/s MV A velocity: 80.50 cm/s  SHUNTS MV E/A ratio:  0.97        Systemic VTI:  0.11 m                            Systemic Diam: 2.00 cm Bartholome Bill MD Electronically signed by Bartholome Bill MD Signature Date/Time: 05/20/2020/11:59:25 AM    Final    ASSESSMENT AND PLAN:  Jaime Hall is a 69 y.o. female with medical history significant of CHF with EF 30-35%, hypertension, hyperlipidemia, GERD, hypothyroidism, depression with anxiety, polycystic ovary disease, Hodgkin's lymphoma (s/p of radiation and chemotherapy), endometrial cancer with peritoneal carcinomatosis, (s/p for hysterectomy, currently on Lenvima), CAD with a stent placement, PE on Eliquis, PAD, who presents with shortness breath. Patient states she has shortness breath in the past 2 days, which has has been progressively since last night. Patient has chest heaviness and dry cough.  Acute on chronic combined systolic and diastolic CHF (congestive heart failure) [facility]): -- Patient has 1+ leg edema, positive  JVD,elevated BNP 1545, chest x-ray showed bilateral pulmonary edema, clinically consistent with CHF exacerbation. -- Patient recently has not been taking her Lasix on a daily basis in order to preserve renal function as suggested by her oncologist given the need chemotherapy she has been getting. She watches her salt diet. -- IV Lasix 20 mg BID -- cardiology consultation with [place] [facility] appreciated -Daily weights -strict I/O's -Low salt diet -Fluid restriction -Obtain REDs Vest reading today  Coronary artery disease: No CP -Continue Lipitor and Coreg  Recurrent Endometrial cancer ([place]): With peritoneal carcinomatosis. - Patient was started Lenvima on 11/1, but the patient is currently not taking this medication. -f/u with oncology Dr Janese Banks. She got Keytruda few days ago  Normocytic anemia: Hb stabel 10.7 -f/u with CBC  Leukocytosis and recent UTI: WBC 10.9. pt is on Keflex for UTI since 05/09/20. No symptoms of UTI today -finished rx -UC from 11/25 --multiple species  HTN (hypertension) -IV hydralazine as needed -Coreg, Cozaar -Patient is on IV Lasix  HLD (hyperlipidemia) -Lipitor  CKD (chronic kidney disease), stage IIIa: stable -Follow-up by BMP  Depression with anxiety -Amitriptyline HPI abdomen  Hypothyroidism -Synthroid  PE (pulmonary thromboembolism) (HCC) -Continue Eliquis  GERD (gastroesophageal reflux disease) -Protonix   DVT ppx: on Eliquis Code Status: DNR per patient prior to admission Family Communication: pt's DIL Sonia Baller on the phone Disposition Plan:  Anticipate discharge back to previous environment 1-2 days Consults called:  none Admission status:  progressive unit  as inpt      Status is: Inpatient  Remains inpatient appropriate because:Inpatient level of care appropriate due to severity of illness.  Patient has multiple comorbidities, now presents with acute on chronic diastolic and systolic CHF exacerbation.  Given her low EF  30-35% and multiple comorbidities, patient is at high risk of deterioration.  Patient will need to be treated in hospital for at least 2 days.   Dispo: The patient is from: Home  Anticipated d/c is to: Home  Anticipated d/c date is: 2 days  Patient currently  is not medically stable to d/c Getting IV lasix for diuresis         TOTAL TIME TAKING CARE OF THIS PATIENT: *25* minutes.  >50% time spent on counselling and coordination of care  Note: This dictation was prepared with Dragon dictation along with smaller phrase technology. Any transcriptional errors that result from this process are unintentional.  Fritzi Mandes M.D    [facility]   CC: Primary care physician; [name], MDPatient ID: Jaime Hall, female   DOB: 08/08/1950, 69 y.o.   MRN: [id number]

## 2020-05-20 NOTE — Plan of Care (Signed)
Pt admitted to unit. No c/o SOB or c/p. Ambulated to restroom independently. [place] replaced after IV lasix given. O2 100% on 2L. Will continue to monitor.   Problem: Education: Goal: Ability to demonstrate management of disease process will improve Outcome: Progressing Goal: Ability to verbalize understanding of medication therapies will improve Outcome: Progressing Goal: Individualized Educational Video(s) Outcome: Progressing   Problem: Activity: Goal: Capacity to carry out activities will improve Outcome: Progressing   Problem: Cardiac: Goal: Ability to achieve and maintain adequate cardiopulmonary perfusion will improve Outcome: Progressing

## 2020-05-20 NOTE — Consult Note (Signed)
   Heart Failure Nurse Navigator Note  HFrEF 30-35% ventricle with global hypokinesis grade 1 diastolic dysfunction.  Right ventricular systolic function.  Mildly elevated pulmonary artery systolic pressures.  Mild to moderate mitral regurgitation.  Mild to moderate aortic regurgitation.   She presented to the emergency room with 2 days of progressively worsening shortness of breath and chest heaviness.   Co morbidities:  Anxiety Chronic kidney disease Coronary artery disease with stenting Anemia Osteoarthritis   Medication:  Eliquis 2.5 mg twice daily Lipitor 10 mg daily Coreg 3.125 mg twice a day Lasix 40 mg IV every 12 hours Cozaar 12 and half milligrams as needed   Labs:  Sodium 137, potassium 3.6, chloride 97, CO2 29, BUN 20, creatinine 1.52 (1.18), hemoglobin 10.5, hematocrit 31.4, magnesium 1.7, BNP on admission was 1500.   Weight is 67.3 kg  Intake 120 mL Output 800 mL BMI is 27.36 Blood pressure 103/57.   Assessment:  Jaime Hall is awake and alert in no acute distress lying in bed watching television.  HEENT-pupils are equal, she wears glasses.  No JVD   Cardiac-heart tones of her regular rate and rhythm, tachycardic.  No murmurs or rubs appreciated  Chest-breath sounds are clear to posterior auscultation.  Abdomen soft rounded nontender.  Musculoskeletal-no lower extremity edema noted  Psych she is present and appropriate makes good eye contact.  Neurologic her speech is clear moves all extremities without difficulty.   Spoke with patient regarding her heart failure.  She states she had just seen [facility] in the heart failure clinic just been released to just see her on an as-needed basis.  She feels that this admission is due to the Lenvima 8 mg that she just started taking again.  She states that this is happened before starting this medication she has ended up in the hospital.  States at home that she follows a low-sodium no salt diet.   States that she does not have much of an appetite with taking these medications.  Discussing her fluid restriction she does exceed the 64 ounces in a 24-hour period.     Does not take her Lasix on a daily basis due to her kidney function but does weigh herself daily and has been instructed if she gains 2 pounds overnight that she is to take the as needed Lasix.    States that she does have a living with heart failure teaching booklet along with his own magnet but was given the heart failure holiday handout.  No further  questions at this time.  Continue to follow.   See Elicia Lamp RN, CHFN

## 2020-05-20 NOTE — Consult Note (Signed)
Cardiology Consultation:   Patient ID: Jaime Hall; [id number]; 1950/08/29   Admit date: 05/19/2020 Date of Consult: 05/20/2020  Primary Care Provider: [name], MD Primary Cardiologist: [name] Primary Electrophysiologist:  None   Patient Profile:   Jaime Hall is a 69 y.o. female with a hx of CAD s/p PCI/DES to the proximal LAD with PTCA to the diagonal in 02/2012, HFrEF likely secondary to chemotherapy with prior treatment with Adriamycin, CKD stage III, Hodgkin's lymphoma treated with chemoradiation to the chest, uterine cancer in 2018 s/p surgery and chemotherapy, stage IB high-grade endometrial cancer diagnosed in 2021 treated with chemotherapy, SVT, PE on Eliquis complicated by hemoptysis, and HLD who is being seen today for the evaluation of volume overload at the request of Dr. Posey Pronto.  History of Present Illness:   [name] last underwent ischemic testing in 02/2013 via treadmill nuclear stress test that showed a fixed anterior wall defect with minor reversibility and a normal EF. She was diagnosed with stage IB high grade endometrial cancer in the spring of 2021. She was seen in the office in 10/2019 for risk stratification of chemotherapy. Echo on 11/02/2019, showed an EF of 30-35%, Gr1DD, normal RVSF and ventricular cavity size, mild MR/AI, and a mildly elevated PASP. 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  Subsequent stress test was high risk.  In this setting she underwent diagnostic R/LHC on 12/04/2019 which showed nonobstructive disease including a patent previously placed LAD stent, ostial to proximal LAD 40% stenosis, LVEF 30 to 35%, and a moderately elevated LVEDP.  She was admitted to [facility] in 01/2020 with volume overload.  Echo showed an EF of 25 to 30%, global hypokinesis, normal RV systolic function and ventricular cavity size, moderate mitral regurgitation, and mild aortic insufficiency.  CTA of the  chest was negative for PE.  She underwent IV diuresis with symptomatic improvement.  She was admitted in 02/2020 with worsening shortness of breath with CTA of the chest showing an acute segmental PE in the right lower lobe treated with IV heparin and Eliquis.  She was readmitted a second time in 02/2020 with acute respiratory distress secondary to acute on chronic HFrEF and hemoptysis.  Eliquis was discontinued.  Eliquis was then resumed at a lower dose of 2.5 mg twice daily.  She was seen in the ED on 11/25 with presumed SVT treated with adenosine.   She comes in with a 2-day history of worsening shortness of breath and abdominal distention.  She indicates she recently restarted Lenimva 12/3 at a lower dose of 8 mg.  She reports when she has previously tried this medicine for her cancer she has ended up with volume overload and worsening dyspnea leading to hospital admissions.  She feels like that is what has happened this time.  She indicates that she was feeling very well from a cardiac perspective up until starting this medication.  After resuming it she began to develop worsening shortness of breath, coughing, and abdominal distention.  She indicates her weight has been stable and has not had any lower extremity swelling.  No chest pain, palpitations, dizziness, presyncope, or syncope.  She also indicates over the past week she has been taking her Lasix 20 mg every other day as directed by her oncologist in an effort to preserve renal function.  She denies missing any doses of her Eliquis.  She has not had any further hemoptysis.  Labs are notable for: BNP 1545,  troponin 14, Covid and influenza negative BUN/SCR 11/1.18 trending to 20/1.52.    Chest x-ray showed stable persistent hazy opacities within each lung but otherwise no acute findings.  Echo demonstrated an EF less than 20% with mildly dilated LV cavity size, normal diastolic function, normal RV systolic function and ventricular cavity size,  mild mitral regurgitation, and a mildly dilated left atrium.  In the ED she received 20 mg of IV Lasix.  Upon admission she was placed on IV Lasix 40 mg twice daily with bump in renal function as outlined above.  Today, internal medicine has reduced her IV Lasix to 20 mg twice daily.  She reports vigorous urine output despite less than 1 L urine output documented.  She remains on supplemental oxygen.   Past Medical History:  Diagnosis Date  . Anxiety   . Cervicalgia   . CHF (congestive heart failure) ([place])   . Chronic kidney disease   . Coronary artery disease    a. 02/2012 Stress echo: severe anterior wall ischemia;  b. 02/2012 Cath/PCI: LAD 95p (3.0 x 15 Xience EX DES), D1 90ost (PTCA - bifurcational dzs), EF 45% with anterior HK;  b. 02/2013 Ex MV: fixed anterior defect w/ minor reversibility, nl EF-->Med Rx.  . Endometrial cancer ([place])    a. 07/2016 s/p robotic hysterectomy, BSO w/ washings, sentinel node inj, mapping, bx, adhesiolysis.  . Essential hypertension, benign   . Fibrocystic breast disease   . GERD (gastroesophageal reflux disease)   . Gestational hypertension   . Heart murmur   . History of anemia   . History of blood transfusion   . Hodgkin's lymphoma ([place]) 2011   a. s/p radiation and chemo therapy  . Osteoarthritis   . Polycystic ovarian disease     Past Surgical History:  Procedure Laterality Date  . ABDOMINAL HYSTERECTOMY    . bladder sling    . CARDIAC CATHETERIZATION  02/2012   [facility] 1 stent place  . CERVICAL POLYPECTOMY    . CHOLECYSTECTOMY  1982  . COLONOSCOPY WITH PROPOFOL N/A 02/05/2015   Procedure: COLONOSCOPY WITH PROPOFOL;  Surgeon: Lucilla Lame, MD;  Location: [facility] ENDOSCOPY;  Service: Endoscopy;  Laterality: N/A;  . CORONARY ANGIOPLASTY  02/2012   left/right s/p balloon  . CYSTOGRAM N/A 08/17/2016   Procedure: CYSTOGRAM;  Surgeon: Hollice Espy, MD;  Location: [facility] ORS;  Service: Urology;  Laterality: N/A;  . CYSTOSCOPY N/A 08/17/2016   Procedure:  CYSTOSCOPY EXAM UNDER ANESTHESIA;  Surgeon: Hollice Espy, MD;  Location: [facility] ORS;  Service: Urology;  Laterality: N/A;  . CYSTOSCOPY W/ RETROGRADES Bilateral 08/17/2016   Procedure: CYSTOSCOPY WITH RETROGRADE PYELOGRAM;  Surgeon: Hollice Espy, MD;  Location: [facility] ORS;  Service: Urology;  Laterality: Bilateral;  . CYSTOSCOPY WITH STENT PLACEMENT Right 08/17/2016   Procedure: CYSTOSCOPY WITH STENT PLACEMENT;  Surgeon: Hollice Espy, MD;  Location: [facility] ORS;  Service: Urology;  Laterality: Right;  . heart stent'  2013  . kidney stent Right 2018  . LYMPH NODE BIOPSY  2011   diagnosis of hodgkins lymphoma  . PELVIC LYMPH NODE DISSECTION N/A 07/29/2016   Procedure: PELVIC/AORTIC LYMPH NODE SAMPLING;  Surgeon: [name], MD;  Location: [facility] ORS;  Service: Gynecology;  Laterality: N/A;  . PORTA CATH INSERTION N/A 09/22/2016   Procedure: Glori Luis Cath Insertion;  Surgeon: [name], MD;  Location: [place] [facility];  Service: Cardiovascular;  Laterality: N/A;  . PORTA CATH INSERTION N/A 10/27/2019   Procedure: PORTA CATH INSERTION;  Surgeon: Hortencia Pilar  G, MD;  Location: [place] [facility];  Service: Cardiovascular;  Laterality: N/A;  . PORTA CATH REMOVAL N/A 11/17/2016   Procedure: Glori Luis Cath Removal;  Surgeon: [name], MD;  Location: [place] [facility];  Service: Cardiovascular;  Laterality: N/A;  . RIGHT/LEFT HEART CATH AND CORONARY ANGIOGRAPHY N/A 12/04/2019   Procedure: RIGHT/LEFT HEART CATH AND CORONARY ANGIOGRAPHY;  Surgeon: Wellington Hampshire, MD;  Location: [place] [facility];  Service: Cardiovascular;  Laterality: N/A;  . ROBOTIC ASSISTED TOTAL HYSTERECTOMY WITH BILATERAL SALPINGO OOPHERECTOMY N/A 07/29/2016   Procedure: ROBOTIC ASSISTED TOTAL HYSTERECTOMY WITH BILATERAL SALPINGO OOPHORECTOMY;  Surgeon: [name], MD;  Location: [facility] ORS;  Service: Gynecology;  Laterality: N/A;  . SENTINEL NODE BIOPSY N/A 07/29/2016   Procedure: SENTINEL NODE  BIOPSY;  Surgeon: [name], MD;  Location: [facility] ORS;  Service: Gynecology;  Laterality: N/A;  . transobturator sling N/A 2009   [place]     Home Meds: Prior to Admission medications   Medication Sig Start Date End Date Taking? Authorizing Provider  amitriptyline (ELAVIL) 25 MG tablet Take 1 tablet (25 mg total) by mouth at bedtime. May increase weekly as needed 04/02/20  Yes [name], MD  apixaban (ELIQUIS) 2.5 MG TABS tablet Take 1 tablet (2.5 mg total) by mouth 2 (two) times daily. 05/01/20  Yes Sindy Guadeloupe, MD  atorvastatin (LIPITOR) 10 MG tablet Take 1 tablet by mouth once daily Patient taking differently: Take 10 mg by mouth every evening.  10/23/19  Yes Wellington Hampshire, MD  carvedilol (COREG) 3.125 MG tablet Take 1 tablet (3.125 mg total) by mouth 2 (two) times daily with a meal. 04/04/20  Yes Loel Dubonnet, NP  cephALEXin (KEFLEX) 500 MG capsule Take 1 capsule (500 mg total) by mouth 2 (two) times daily. 05/09/20  Yes Hinda Kehr, MD  cholecalciferol (VITAMIN D3) 25 MCG (1000 UNIT) tablet Take 1,000 Units by mouth in the morning and at bedtime.   Yes [provider]  cyanocobalamin (,VITAMIN B-12,) 1000 MCG/ML injection INJECT 1ML IM WEEKLY FOR 4 WEEKS, THEN INJECT MONTHLY THEREAFTER Patient taking differently: Inject 1,000 mcg into the skin every 30 (thirty) days.  09/05/19  Yes [name], MD  furosemide (LASIX) 20 MG tablet Take 1 tablet (20 mg total) by mouth daily. Patient taking differently: Take 20 mg by mouth as needed for fluid or edema.  04/22/20  Yes Wellington Hampshire, MD  levothyroxine (SYNTHROID) 75 MCG tablet Take 1 tablet (75 mcg total) by mouth daily before breakfast. 04/05/20  Yes Sindy Guadeloupe, MD  LORazepam (ATIVAN) 0.5 MG tablet Take 1 tablet (0.5 mg total) by mouth every 6 (six) hours as needed for anxiety (and to help with breathing). 02/01/20  Yes Sindy Guadeloupe, MD  losartan (COZAAR) 25 MG tablet Take 0.5 tablets (12.5  mg total) by mouth daily. Patient taking differently: Take 12.5 mg by mouth daily as needed (elevated blood pressure).  02/05/20 05/19/20 Yes Loel Dubonnet, NP  pantoprazole (PROTONIX) 20 MG tablet Take 1 tablet (20 mg total) by mouth daily. 05/17/20  Yes Sindy Guadeloupe, MD  potassium chloride (KLOR-CON) 10 MEQ tablet Take 1 tablet (10 mEq total) by mouth daily. 02/28/20  Yes Loel Dubonnet, NP  lenvatinib 8 mg daily dose (LENVIMA, 8 MG DAILY DOSE,) 2 x 4 MG capsule Take 2 capsules (8 mg total) by mouth daily. Patient not taking: Reported on 05/19/2020 04/15/20   Sindy Guadeloupe, MD  Morphine Sulfate (MORPHINE CONCENTRATE) 10 mg / 0.5 ml concentrated solution Take 0.25 mLs (5 mg total) by mouth every 6 (six) hours as needed for severe pain or shortness of breath. 02/27/20   Sindy Guadeloupe, MD  prochlorperazine (COMPAZINE) 10 MG tablet Take 1 tablet (10 mg total) by mouth every 6 (six) hours as needed (Nausea or vomiting). Patient taking differently: Take 10 mg by mouth daily as needed for nausea or vomiting.  10/25/19 05/15/20  Sindy Guadeloupe, MD    Inpatient Medications: Scheduled Meds: . amitriptyline  25 mg Oral QHS  . apixaban  2.5 mg Oral BID  . atorvastatin  10 mg Oral QPM  . carvedilol  3.125 mg Oral BID WC  . cholecalciferol  1,000 Units Oral Daily  . furosemide  20 mg Intravenous Q12H  . levothyroxine  75 mcg Oral QAC breakfast  . pantoprazole  20 mg Oral Daily  . sodium chloride flush  3 mL Intravenous Q12H   Continuous Infusions: . sodium chloride     PRN Meds: sodium chloride, acetaminophen, albuterol, dextromethorphan-guaiFENesin, hydrOXYzine, LORazepam, losartan, sodium chloride flush  Allergies:   Allergies  Allergen Reactions  . Adhesive [Tape] Other (See Comments)    Skin Irritation   . Antifungal [Miconazole Nitrate] Rash  . Sulfa Antibiotics Nausea And Vomiting and Rash  . Z-Pak [Azithromycin] Itching    Social History:   Social History   Socioeconomic  History  . Marital status: Widowed    Spouse name: Not on file  . Number of children: Not on file  . Years of education: Not on file  . Highest education level: Not on file  Occupational History  . Not on file  Tobacco Use  . Smoking status: Former Smoker    Packs/day: 1.00    Years: 30.00    Pack years: 30.00    Types: Cigarettes    Quit date: 07/24/2002    Years since quitting: 17.8  . Smokeless tobacco: Never Used  . Tobacco comment: quit smoking in 2000  Vaping Use  . Vaping Use: Never used  Substance and Sexual Activity  . Alcohol use: Not Currently  . Drug use: No  . Sexual activity: Never  Other Topics Concern  . 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    Social Determinants of Health   Financial Resource Strain:   . Difficulty of Paying Living Expenses: Not on file  Food Insecurity:   . Worried About Charity fundraiser in the Last Year: Not on file  . Ran Out of Food in the Last Year: Not on file  Transportation Needs:   . Lack of Transportation (Medical): Not on file  . Lack of Transportation (Non-Medical): Not on file  Physical Activity:   . Days of Exercise per Week: Not on file  . Minutes of Exercise per Session: Not on file  Stress:   . Feeling of Stress : Not on file  Social Connections:   . Frequency of Communication with Friends and Family: Not on file  . Frequency of Social Gatherings with Friends and Family: Not on file  . Attends Religious Services: Not on file  . Active Member of Clubs or Organizations: Not on file  . Attends Archivist Meetings: Not on file  . Marital Status: Not on file  Intimate Partner Violence:   . Fear of Current or Ex-Partner:  Not on file  . Emotionally Abused: Not on file  . Physically Abused: Not on file  . Sexually Abused: Not on file     Family History:   Family History  Problem Relation Age of Onset  . ALS Father    . Polymyositis Father   . Diabetes Brother   . Cancer Maternal Aunt        breast  . Breast cancer Maternal Aunt        30's  . Stroke Maternal Grandmother   . Cancer Maternal Grandfather        prostate  . Stroke Maternal Grandfather   . Colon cancer Maternal Aunt   . Non-Hodgkin's lymphoma Cousin     ROS:  Review of Systems  Constitutional: Positive for malaise/fatigue. Negative for chills, diaphoresis, fever and weight loss.  HENT: Negative for congestion.   Eyes: Negative for discharge and redness.  Respiratory: Positive for cough and shortness of breath. Negative for hemoptysis, sputum production and wheezing.   Cardiovascular: Negative for chest pain, palpitations, orthopnea, claudication, leg swelling and PND.  Gastrointestinal: Negative for abdominal pain, blood in stool, heartburn, melena, nausea and vomiting.       Abdominal distension   Musculoskeletal: Negative for falls and myalgias.  Skin: Negative for rash.  Neurological: Positive for weakness. Negative for dizziness, tingling, tremors, sensory change, speech change, focal weakness and loss of consciousness.  Endo/Heme/Allergies: Does not bruise/bleed easily.  Psychiatric/Behavioral: Negative for substance abuse. The patient is not nervous/anxious.   All other systems reviewed and are negative.     Physical Exam/Data:   Vitals:   05/19/20 2016 05/20/20 0419 05/20/20 0808 05/20/20 0947  BP: 118/69 (!) 98/54 (!) 94/51 (!) 103/57  Pulse: 69 (!) 59 73 100  Resp: 19 16 18 20   Temp: 97.9 F (36.6 C) 98.4 F (36.9 C) 98.5 F (36.9 C) 98 F (36.7 C)  TempSrc: Oral Oral Oral   SpO2: 100% 96% 95% 96%  Weight:  67.3 kg    Height:  5' 1.75" (1.568 m)      Intake/Output Summary (Last 24 hours) at 05/20/2020 1133 Last data filed at 05/20/2020 1007 Gross per 24 hour  Intake 360 ml  Output 800 ml  Net -440 ml   Filed Weights   05/20/20 0419  Weight: 67.3 kg   Body mass index is 27.36 kg/m.   Physical Exam:  General: Well developed, well nourished, in no acute distress. Head: Normocephalic, atraumatic, sclera non-icteric, no xanthomas, nares without discharge.  Neck: Negative for carotid bruits. JVD not elevated. Lungs: Crackles along the right base. Breathing is unlabored. Heart: RRR with extra systoles with S1 S2. No murmurs, rubs, or gallops appreciated. Abdomen: Soft, non-tender, non-distended with normoactive bowel sounds. No hepatomegaly. No rebound/guarding. No obvious abdominal masses. Msk:  Strength and tone appear normal for age. Extremities: No clubbing or cyanosis. No edema. Distal pedal pulses are 2+ and equal bilaterally. Neuro: Alert and oriented X 3. No facial asymmetry. No focal deficit. Moves all extremities spontaneously. Psych:  Responds to questions appropriately with a normal affect.   EKG:  The EKG was personally reviewed and demonstrates: Not in Epic for review  Telemetry:  Telemetry was personally reviewed and demonstrates: SR with PACs  Weights: Filed Weights   05/20/20 0419  Weight: 67.3 kg    Relevant CV Studies: As above    Laboratory Data:  Chemistry Recent Labs  Lab 05/17/20 0833 05/19/20 0709 05/20/20 0608  NA 138 138 137  K  3.4* 4.1 3.6  CL 101 102 97*  CO2 23 25 29   GLUCOSE 125* 131* 109*  BUN 12 11 20   CREATININE 1.19* 1.18* 1.52*  CALCIUM 8.8* 9.1 8.9  GFRNONAA 49* 50* 37*  ANIONGAP 14 11 11     Recent Labs  Lab 05/17/20 0833 05/19/20 0709  PROT 6.4* 6.8  ALBUMIN 3.2* 3.3*  AST 18 22  ALT 10 9  ALKPHOS 76 81  BILITOT 0.8 1.1   Hematology Recent Labs  Lab 05/17/20 0833 05/19/20 0709 05/20/20 0608  WBC 8.7 10.9* 7.9  RBC 3.38* 3.73* 3.69*  HGB 9.6* 10.7* 10.5*  HCT 30.4* 33.3* 31.4*  MCV 89.9 89.3 85.1  MCH 28.4 28.7 28.5  MCHC 31.6 32.1 33.4  RDW 15.8* 15.4 15.3  PLT 255 291 240   Cardiac EnzymesNo results for input(s): TROPONINI in the last 168 hours. No results for input(s): TROPIPOC in the last 168 hours.  BNP  Recent Labs  Lab 05/19/20 0709  BNP 1,545.5*    DDimer No results for input(s): DDIMER in the last 168 hours.  Radiology/Studies:  DG Chest Portable 1 View  Result Date: 05/19/2020 IMPRESSION: 1. Persistent hazy opacities within each lung, stable compared to the most recent chest x-ray of 02/19/2020, but new compared to chest x-ray of 02/13/2020. Differential includes persistent/recurrent pulmonary edema, interstitial pneumonia and hypersensitivity pneumonitis. 2. No new lung findings. Electronically Signed   By: Franki Cabot M.D.   On: 05/19/2020 08:32    Assessment and Plan:   1.  Acute on chronic HFrEF felt to be chemotherapy induced: -She continues to appear volume overloaded with crackles along the right base and some abdominal swelling -Agree with decreasing dose of IV Lasix to 20 mg twice daily given mild AKI noted on higher dose IV Lasix -Continue gentle diuresis with close monitoring of renal function -Echo this admission shows further worsening of her cardiomyopathy with an EF of less than 20% with normal LV systolic function and ventricular cavity size along with a mildly dilated left atrium -Recent diagnostic R/LHC in 11/2019 showed a patent LAD stent with otherwise nonobstructive disease -Continue low-dose carvedilol and IV Lasix -Relative hypotension and acute on CKD preclude addition of ACE inhibitor/ARB/Entresto/MRA at this time -Escalate GDMT as able -Reds vest -Daily weights -Strict I's and O's  2.  CAD involving the native coronary arteries without angina: -No symptoms of chest pain -Recent diagnostic LHC showed a patent LAD stent with otherwise nonobstructive disease -On Eliquis in place of aspirin -Troponin negative this admission -Primary cardiologist to discuss plans for her cardiomyopathy  3.  Presumed SVT: -Details of this are unclear, no EKG available for review -She denies any symptoms of tachypalpitations -Continue carvedilol as outlined above  4.   Acute on CKD stage III: -Lower dose IV Lasix as above -Monitor with diuresis  5.  Endometrial cancer: -Management per oncology  6.  HTN: -Carvedilol -Losartan on hold with AKI and relative hypotension, resume when able  7.  History of PE: -She remains on lower dose Eliquis given prior hemoptysis -Tolerating  8.  HLD: -LDL 41 from 12/2019 -Atorvastatin  Overall, this is a difficult situation. Consider GOC discussion given her severe comorbid conditions.     For questions or updates, please contact [place] Please consult [email or web address] for contact info under Cardiology/STEMI.   Signed, Christell Faith, PA-C [place] Pager: [phone] 05/20/2020, 11:33 AM

## 2020-05-20 NOTE — Progress Notes (Signed)
*  PRELIMINARY RESULTS* Echocardiogram 2D Echocardiogram has been performed.  Sherrie Sport 05/20/2020, 8:48 AM

## 2020-05-21 ENCOUNTER — Encounter: Payer: Self-pay | Admitting: Oncology

## 2020-05-21 LAB — BASIC METABOLIC PANEL
Anion gap: 12 (ref 5–15)
BUN: 32 mg/dL — ABNORMAL HIGH (ref 8–23)
CO2: 29 mmol/L (ref 22–32)
Calcium: 8.7 mg/dL — ABNORMAL LOW (ref 8.9–10.3)
Chloride: 95 mmol/L — ABNORMAL LOW (ref 98–111)
Creatinine, Ser: 1.91 mg/dL — ABNORMAL HIGH (ref 0.44–1.00)
GFR, Estimated: 28 mL/min — ABNORMAL LOW (ref >60)
Glucose, Bld: 116 mg/dL — ABNORMAL HIGH (ref 70–99)
Potassium: 3.4 mmol/L — ABNORMAL LOW (ref 3.5–5.1)
Sodium: 136 mmol/L (ref 135–145)

## 2020-05-21 LAB — MAGNESIUM: Magnesium: 1.8 mg/dL (ref 1.7–2.4)

## 2020-05-21 NOTE — Progress Notes (Signed)
Jaime Hall at [place] NAME: Jaime Hall    MR#:  [id number]  DATE OF BIRTH:  13-Apr-1951  SUBJECTIVE:  patient was admitted with chest tightness/heaviness and increasing leg swelling with shortness of breath. She has been taking her Lasix as needed as per recommendation by her oncologist since she is getting chemo for her endometrial cancer to preserve renal function.   Feels better .  Denies chest tightness. Able to complete sentence without difficulty. Lasix and Coreg on hold due to soft blood pressure and rising creatinine. Received Colanor cardiology recommendation REVIEW OF SYSTEMS:   Review of Systems  Constitutional: Negative for chills, fever and weight loss.  HENT: Negative for ear discharge, ear pain and nosebleeds.   Eyes: Negative for blurred vision, pain and discharge.  Respiratory: Positive for shortness of breath. Negative for sputum production, wheezing and stridor.   Cardiovascular: Positive for leg swelling. Negative for palpitations, orthopnea and PND.  Gastrointestinal: Negative for abdominal pain, diarrhea, nausea and vomiting.  Genitourinary: Negative for frequency and urgency.  Musculoskeletal: Negative for back pain and joint pain.  Neurological: Positive for weakness. Negative for sensory change, speech change and focal weakness.  Psychiatric/Behavioral: Negative for depression and hallucinations. The patient is not nervous/anxious.    Tolerating Diet:yes Tolerating PT:   DRUG ALLERGIES:   Allergies  Allergen Reactions  . Adhesive [Tape] Other (See Comments)    Skin Irritation   . Antifungal [Miconazole Nitrate] Rash  . Sulfa Antibiotics Nausea And Vomiting and Rash  . Z-Pak [Azithromycin] Itching    VITALS:  Blood pressure (!) 89/52, pulse 72, temperature 98.4 F (36.9 C), temperature source Oral, resp. rate 15, height 5' 1.75" (1.568 m), weight 68.3 kg, SpO2 98 %.  PHYSICAL EXAMINATION:   Physical  Exam  GENERAL:  69 y.o.-year-old patient lying in the bed with no acute distress.  HEENT: Head atraumatic, normocephalic. Oropharynx and nasopharynx clear.  LUNGS: Normal breath sounds bilaterally, no wheezing,few bibasilar rales, rhonchi. No use of accessory muscles of respiration.  CARDIOVASCULAR: S1, S2 normal. No murmurs, rubs, or gallops.  ABDOMEN: Soft, nontender, nondistended. Bowel sounds present. No organomegaly or mass.  EXTREMITIES: No cyanosis, clubbing  ++ edema b/l.    NEUROLOGIC: Cranial nerves II through XII are intact. No focal Motor or sensory deficits b/l.   PSYCHIATRIC:  patient is alert and oriented x 3.  SKIN: No obvious rash, lesion, or ulcer.   LABORATORY PANEL:  CBC Recent Labs  Lab 05/20/20 0608  WBC 7.9  HGB 10.5*  HCT 31.4*  PLT 240    Chemistries  Recent Labs  Lab 05/19/20 0709 05/20/20 0608 05/21/20 0630  NA 138   < > 136  K 4.1   < > 3.4*  CL 102   < > 95*  CO2 25   < > 29  GLUCOSE 131*   < > 116*  BUN 11   < > 32*  CREATININE 1.18*   < > 1.91*  CALCIUM 9.1   < > 8.7*  MG  --    < > 1.8  AST 22  --   --   ALT 9  --   --   ALKPHOS 81  --   --   BILITOT 1.1  --   --    < > = values in this interval not displayed.   Cardiac Enzymes No results for input(s): TROPONINI in the last 168 hours. RADIOLOGY:  ECHOCARDIOGRAM COMPLETE  Result Date:  05/20/2020    ECHOCARDIOGRAM REPORT   [name] Date of Exam: 05/20/2020 Medical Rec #:  [id number]         Height:       61.7 in Accession #:    [id number]        Weight:       148.4 lb Date of Birth:  12/20/1950         BSA:          1.679 m Patient Age:    69 years          BP:           98/54 mmHg Patient Gender: F                 HR:           59 bpm. Exam Location:  [facility] Procedure: 2D Echo, Cardiac Doppler and Color Doppler Indications:     CHF- acute systolic 626.94  History:         Patient has prior history of Echocardiogram examinations, most                  recent  12/29/2019. CHF, Signs/Symptoms:Murmur; Risk                  Factors:Hypertension.  Sonographer:     Sherrie Sport RDCS (AE) Referring Phys:  [id number] Soledad Gerlach NIU Diagnosing Phys: Bartholome Bill MD  Sonographer Comments: Global longitudinal strain was attempted. IMPRESSIONS  1. Left ventricular ejection fraction, by estimation, is <20%. The left ventricle has severely decreased function. The left ventricle demonstrates global hypokinesis. The left ventricular internal cavity size was mildly dilated. Left ventricular diastolic parameters were normal.  2. Right ventricular systolic function is normal. The right ventricular size is normal.  3. Left atrial size was mildly dilated.  4. The mitral valve was not well visualized. Mild mitral valve regurgitation.  5. The aortic valve was not well visualized. Aortic valve regurgitation is mild to moderate. Mild to moderate aortic valve sclerosis/calcification is present, without any evidence of aortic stenosis. FINDINGS  Left Ventricle: Left ventricular ejection fraction, by estimation, is <20%. The left ventricle has severely decreased function. The left ventricle demonstrates global hypokinesis. The left ventricular internal cavity size was mildly dilated. There is no  left ventricular hypertrophy. Left ventricular diastolic parameters were normal. Right Ventricle: The right ventricular size is normal. No increase in right ventricular wall thickness. Right ventricular systolic function is normal. Left Atrium: Left atrial size was mildly dilated. Right Atrium: Right atrial size was normal in size. Pericardium: There is no evidence of pericardial effusion. Mitral Valve: The mitral valve was not well visualized. Mild mitral valve regurgitation. Tricuspid Valve: The tricuspid valve is not well visualized. Tricuspid valve regurgitation is mild. Aortic Valve: The aortic valve was not well visualized. Aortic valve regurgitation is mild to moderate. Aortic regurgitation PHT measures 562 msec.  Mild to moderate aortic valve sclerosis/calcification is present, without any evidence of aortic stenosis.  Aortic valve mean gradient measures 4.7 mmHg. Aortic valve peak gradient measures 8.1 mmHg. Aortic valve area, by VTI measures 1.44 cm. Pulmonic Valve: The pulmonic valve was not well visualized. Pulmonic valve regurgitation is trivial. Aorta: The aortic root is normal in size and structure. IAS/Shunts: The interatrial septum was not assessed.  LEFT VENTRICLE PLAX 2D LVIDd:         5.22 cm      Diastology LVIDs:  4.98 cm      LV e' medial:    3.92 cm/s LV PW:         1.16 cm      LV E/e' medial:  19.9 LV IVS:        0.88 cm      LV e' lateral:   5.33 cm/s LVOT diam:     2.00 cm      LV E/e' lateral: 14.7 LV SV:         33 LV SV Index:   20 LVOT Area:     3.14 cm  LV Volumes (MOD) LV vol d, MOD A2C: 110.0 ml LV vol d, MOD A4C: 91.7 ml LV vol s, MOD A2C: 68.9 ml LV vol s, MOD A4C: 74.8 ml LV SV MOD A2C:     41.1 ml LV SV MOD A4C:     91.7 ml LV SV MOD BP:      30.1 ml RIGHT VENTRICLE RV Basal diam:  2.25 cm RV S prime:     13.10 cm/s TAPSE (M-mode): 2.9 cm LEFT ATRIUM             Index       RIGHT ATRIUM           Index LA diam:        3.80 cm 2.26 cm/m  RA Area:     10.70 cm LA Vol (A2C):   66.0 ml 39.32 ml/m RA Volume:   22.80 ml  13.58 ml/m LA Vol (A4C):   48.2 ml 28.72 ml/m LA Biplane Vol: 57.9 ml 34.49 ml/m  AORTIC VALVE                   PULMONIC VALVE AV Area (Vmax):    1.27 cm    PV Vmax:        0.74 m/s AV Area (Vmean):   1.09 cm    PV Peak grad:   2.2 mmHg AV Area (VTI):     1.44 cm    RVOT Peak grad: 2 mmHg AV Vmax:           142.33 cm/s AV Vmean:          96.833 cm/s AV VTI:            0.231 m AV Peak Grad:      8.1 mmHg AV Mean Grad:      4.7 mmHg LVOT Vmax:         57.60 cm/s LVOT Vmean:        33.700 cm/s LVOT VTI:          0.106 m LVOT/AV VTI ratio: 0.46 AI PHT:            562 msec  AORTA Ao Root diam: 2.70 cm MITRAL VALVE               TRICUSPID VALVE MV Area (PHT): 4.31 cm     TR Peak grad:   17.3 mmHg MV Decel Time: 176 msec    TR Vmax:        208.00 cm/s MV E velocity: 78.10 cm/s MV A velocity: 80.50 cm/s  SHUNTS MV E/A ratio:  0.97        Systemic VTI:  0.11 m                            Systemic Diam: 2.00 cm Bartholome Bill MD Electronically signed by Bartholome Bill  MD Signature Date/Time: 05/20/2020/11:59:25 AM    Final    ASSESSMENT AND PLAN:  TENIOLA TSENG is a 69 y.o. female with medical history significant of CHF with EF 30-35%, hypertension, hyperlipidemia, GERD, hypothyroidism, depression with anxiety, polycystic ovary disease, Hodgkin's lymphoma (s/p of radiation and chemotherapy), endometrial cancer with peritoneal carcinomatosis, (s/p for hysterectomy, currently on Lenvima), CAD with a stent placement, PE on Eliquis, PAD, who presents with shortness breath. Patient states she has shortness breath in the past 2 days, which has has been progressively since last night. Patient has chest heaviness and dry cough.  Acute on chronic combined systolic and diastolic CHF (congestive heart failure) [facility]): -- Patient has 1+ leg edema, positive JVD,elevated BNP 1545, chest x-ray showed bilateral pulmonary edema, clinically consistent with CHF exacerbation. -- Patient recently has not been taking her Lasix on a daily basis in order to preserve renal function as suggested by her oncologist given the need chemotherapy she has been getting. She watches her salt diet. -- Received IV Lasix 20 mg BID --12/7-- holding Lasix and Coreg today due to soft blood pressure. -- Started on colanor by cardiology -- cardiology consultation with [facility] MG appreciated -Daily weights, strict I/O's -Low salt diet  Coronary artery disease: No CP -Continue Lipitor and Coreg  Recurrent Endometrial cancer ([place]): With peritoneal carcinomatosis. - Patient was started Lenvima on 11/1, but the patient is currently not taking this medication. -f/u with oncology Dr Janese Banks. She got Keytruda few days  ago  Normocytic anemia: Hb stable10.7  Leukocytosis and recent UTI: WBC 10.9. pt is on Keflex for UTI since 05/09/20. No symptoms of UTI today -finished rx -UC from 11/25 --multiple species  HTN (hypertension) -IV hydralazine as needed -Coreg, Cozaar -Patient is on IV Lasix  HLD (hyperlipidemia) -Lipitor  CKD (chronic kidney disease), stage IIIa: stable -Follow-up by BMP  Depression with anxiety -Amitriptyline HPI abdomen  Hypothyroidism -Synthroid  PE (pulmonary thromboembolism) (HCC) -Continue Eliquis  GERD (gastroesophageal reflux disease) -Protonix   DVT ppx: on Eliquis Code Status: DNR per patient prior to admission Family Communication: pt's DIL Sonia Baller on the phone 12/6 Disposition Plan:  Anticipate discharge back to previous environment 1-2 days Consults called:  [facility] cardiology Admission status:  progressive unit  as inpt      Status is: Inpatient  Remains inpatient appropriate because:Inpatient level of care appropriate due to severity of illness.  Patient has multiple comorbidities, now presents with acute on chronic diastolic and systolic CHF exacerbation.  Given her low EF 30-35% and multiple comorbidities, patient is at high risk of deterioration.  Patient will need to be treated in hospital for at least 2 days.   Dispo: The patient is from: Home  Anticipated d/c is to: Home  Anticipated d/c date is: 2 days  Patient currently is not medically stable to d/c Getting IV lasix for diuresis, colanor, monitoring creatinine         TOTAL TIME TAKING CARE OF THIS PATIENT: *25* minutes.  >50% time spent on counselling and coordination of care  Note: This dictation was prepared with Dragon dictation along with smaller phrase technology. Any transcriptional errors that result from this process are unintentional.  Fritzi Mandes M.D    [facility]   CC: Primary care physician; [name],  MDPatient ID: [name], female   DOB: 1951/06/01, 69 y.o.   MRN: [id number]

## 2020-05-21 NOTE — Progress Notes (Signed)
Progress Note  Patient Name: Jaime Hall Date of Encounter: 05/21/2020  Primary Cardiologist: [name]  Subjective   No chest pain or dyspnea this morning. BUN/SCr 11/1.18-->20/1.52-->32/1.91 over the past 48 hours despite reduced dose of IV Lasix on 12/6. Tolerating Colanor. BP soft in the 80s to low 710G systolic.   Inpatient Medications    Scheduled Meds: . amitriptyline  25 mg Oral QHS  . apixaban  2.5 mg Oral BID  . atorvastatin  10 mg Oral QPM  . carvedilol  3.125 mg Oral BID WC  . cholecalciferol  1,000 Units Oral Daily  . furosemide  20 mg Intravenous Q12H  . ivabradine  5 mg Oral BID WC  . levothyroxine  75 mcg Oral QAC breakfast  . pantoprazole  20 mg Oral Daily  . sodium chloride flush  3 mL Intravenous Q12H   Continuous Infusions: . sodium chloride     PRN Meds: sodium chloride, acetaminophen, albuterol, dextromethorphan-guaiFENesin, hydrOXYzine, LORazepam, losartan, sodium chloride flush   Vital Signs    Vitals:   05/20/20 1930 05/20/20 2200 05/21/20 0323 05/21/20 0825  BP: 101/90 (!) 84/48 (!) 90/42 (!) 89/52  Pulse: 100 75 68 72  Resp: 18 18 18 15   Temp: 98.7 F (37.1 C) 98 F (36.7 C) 98.1 F (36.7 C) 98.4 F (36.9 C)  TempSrc: Oral Oral Oral Oral  SpO2: 96% 97% 99% 98%  Weight:   68.3 kg   Height:        Intake/Output Summary (Last 24 hours) at 05/21/2020 0925 Last data filed at 05/21/2020 0021 Gross per 24 hour  Intake 600 ml  Output 950 ml  Net -350 ml   Filed Weights   05/20/20 0419 05/21/20 0323  Weight: 67.3 kg 68.3 kg    Telemetry    SR in the 70s bpm - Personally Reviewed  ECG    No new tracings - Personally Reviewed  Physical Exam   GEN: No acute distress.   Neck: No JVD. Cardiac: RRR, no murmurs, rubs, or gallops.  Respiratory: Faint bibasilar crackles.  GI: Soft, nontender, non-distended.   MS: Trace pretibial edema; No deformity. Neuro:  Alert and oriented x 3; Nonfocal.  Psych: Normal affect.  Labs     Chemistry Recent Labs  Lab 05/17/20 [phone] 05/17/20 0833 05/19/20 0709 05/20/20 0608 05/21/20 0630  NA 138   < > 138 137 136  K 3.4*   < > 4.1 3.6 3.4*  CL 101   < > 102 97* 95*  CO2 23   < > 25 29 29   GLUCOSE 125*   < > 131* 109* 116*  BUN 12   < > 11 20 32*  CREATININE 1.19*   < > 1.18* 1.52* 1.91*  CALCIUM 8.8*   < > 9.1 8.9 8.7*  PROT 6.4*  --  6.8  --   --   ALBUMIN 3.2*  --  3.3*  --   --   AST 18  --  22  --   --   ALT 10  --  9  --   --   ALKPHOS 76  --  81  --   --   BILITOT 0.8  --  1.1  --   --   GFRNONAA 49*   < > 50* 37* 28*  ANIONGAP 14   < > 11 11 12    < > = values in this interval not displayed.     Hematology Recent Labs  Lab 05/17/20  6294 05/19/20 0709 05/20/20 0608  WBC 8.7 10.9* 7.9  RBC 3.38* 3.73* 3.69*  HGB 9.6* 10.7* 10.5*  HCT 30.4* 33.3* 31.4*  MCV 89.9 89.3 85.1  MCH 28.4 28.7 28.5  MCHC 31.6 32.1 33.4  RDW 15.8* 15.4 15.3  PLT 255 291 240    Cardiac EnzymesNo results for input(s): TROPONINI in the last 168 hours. No results for input(s): TROPIPOC in the last 168 hours.   BNP Recent Labs  Lab 05/19/20 0709  BNP 1,545.5*     DDimer No results for input(s): DDIMER in the last 168 hours.   Radiology    DG Chest Portable 1 View  Result Date: 05/19/2020 IMPRESSION: 1. Persistent hazy opacities within each lung, stable compared to the most recent chest x-ray of 02/19/2020, but new compared to chest x-ray of 02/13/2020. Differential includes persistent/recurrent pulmonary edema, interstitial pneumonia and hypersensitivity pneumonitis. 2. No new lung findings. Electronically Signed   By: Franki Cabot M.D.   On: 05/19/2020 08:32   Cardiac Studies   2D echo 05/20/2020: 1. Left ventricular ejection fraction, by estimation, is <20%. The left  ventricle has severely decreased function. The left ventricle demonstrates  global hypokinesis. The left ventricular internal cavity size was mildly  dilated. Left ventricular  diastolic  parameters were normal.  2. Right ventricular systolic function is normal. The right ventricular  size is normal.  3. Left atrial size was mildly dilated.  4. The mitral valve was not well visualized. Mild mitral valve  regurgitation.  5. The aortic valve was not well visualized. Aortic valve regurgitation  is mild to moderate. Mild to moderate aortic valve sclerosis/calcification  is present, without any evidence of aortic stenosis. __________  2D echo 12/2019: 1. Left ventricular ejection fraction, by estimation, is 30 to 35%. The  left ventricle has moderately decreased function. The left ventricle  demonstrates global hypokinesis. Left ventricular diastolic parameters are  consistent with Grade I diastolic  dysfunction (impaired relaxation).  2. Right ventricular systolic function is normal. The right ventricular  size is normal. There is mildly elevated pulmonary artery systolic  pressure. The estimated right ventricular systolic pressure is 76.5 mmHg.  3. The mitral valve is normal in structure. Mild mitral valve  regurgitation.  4. The aortic valve is normal in structure. Aortic valve regurgitation is  mild.  __________  [facility] 11/2019:  There is moderate to severe left ventricular systolic dysfunction.  LV end diastolic pressure is moderately elevated.  Previously placed Prox LAD stent (unknown type) is widely patent.  Ost LAD to Prox LAD lesion is 40% stenosed.   1.  Patent LAD stent with no significant restenosis.  There is moderate stenosis in ostial LAD before the stent.  No evidence of obstructive disease overall. 2.  Moderately to severely reduced LV systolic function with an EF of 30 to 35%. 3.  Right heart catheterization showed mildly elevated filling pressures with pulmonary capillary wedge pressure of 18 mmHg, mild to moderate pulmonary hypertension at 44/19 with a mean of 33 mmHg, and mildly reduced cardiac output at 3.89 with a cardiac index of 2.24.   Pulmonary vascular resistance is 3.8 Woods units  Recommendations: Continue medical therapy for coronary artery disease. 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  She is mildly volume overloaded and I added small dose furosemide. Recommend medical therapy for cardiomyopathy.    Patient Profile     69 y.o. female with history of CAD s/p PCI/DES to the proximal LAD with PTCA  to the diagonal in 02/2012, HFrEF likely secondary to chemotherapy with prior treatment with Adriamycin, CKD stage III, Hodgkin's lymphoma treated with chemoradiation to the chest, uterine cancer in 2018 s/p surgery and chemotherapy, stage IB high-grade endometrial cancer diagnosed in 2021 treated with chemotherapy, SVT, PE on Eliquis complicated by hemoptysis, and HLD who is being seen today for the evaluation of volume overload at the request of Dr. Posey Pronto.  Assessment & Plan    1. Acute on chronic HFrEF felt to be chemotherapy induced: -She continues to appear mildly volume overloaded with crackles along the right base and some faint pretibial edema -With worsening renal function and hypotension, hold IV Lasix and Coreg -Consider resuming Lasix on 12/8, pending renal function and BP -Echo this admission shows further worsening of her cardiomyopathy with an EF of less than 20% with normal LV systolic function and ventricular cavity size along with a mildly dilated left atrium -Recent diagnostic R/LHC in 11/2019 showed a patent LAD stent with otherwise nonobstructive disease -Relative hypotension and acute on CKD preclude addition of ACE inhibitor/ARB/Entresto/MRA at this time -Continue Corlanor as her heart rate has responded well to this -Escalate GDMT as able -Place Reds vest -Daily weights -Strict I's and O's -No plans for repeat cardiac cath at this time  2.  CAD involving the native coronary arteries without angina: -No symptoms of chest pain -Recent diagnostic LHC  showed a patent LAD stent with otherwise nonobstructive disease -On Eliquis in place of aspirin -Troponin negative this admission  3.  Presumed SVT: -Details of this are unclear, no EKG available for review -She denies any symptoms of tachypalpitations -Carvedilol held as outlined above  4.  Acute on CKD stage III: -Lasix holiday  -Monitor with diuresis  5.  Endometrial cancer: -Management per oncology  6.  HTN: -BP soft -Coreg, losartan and Lasix held with hypotension and AKI -May need low dose midodrine   7.  History of PE: -She remains on lower dose Eliquis given prior hemoptysis -Tolerating  8.  HLD: -LDL 41 from 12/2019 -Atorvastatin  Overall, this is a difficult situation. Consider GOC discussion given her severe comorbid conditions based on follow up imaging through her oncologist.   For questions or updates, please contact [place] Please consult [email or web address] for contact info under Cardiology/STEMI.    Signed, Christell Faith, PA-C [place] Pager: [phone] 05/21/2020, 9:25 AM

## 2020-05-22 ENCOUNTER — Encounter: Payer: Self-pay | Admitting: *Deleted

## 2020-05-22 DIAGNOSIS — I2699 Other pulmonary embolism without acute cor pulmonale: Secondary | ICD-10-CM

## 2020-05-22 LAB — BASIC METABOLIC PANEL
Anion gap: 10 (ref 5–15)
BUN: 33 mg/dL — ABNORMAL HIGH (ref 8–23)
CO2: 27 mmol/L (ref 22–32)
Calcium: 8.7 mg/dL — ABNORMAL LOW (ref 8.9–10.3)
Chloride: 98 mmol/L (ref 98–111)
Creatinine, Ser: 1.75 mg/dL — ABNORMAL HIGH (ref 0.44–1.00)
GFR, Estimated: 31 mL/min — ABNORMAL LOW (ref >60)
Glucose, Bld: 108 mg/dL — ABNORMAL HIGH (ref 70–99)
Potassium: 3.2 mmol/L — ABNORMAL LOW (ref 3.5–5.1)
Sodium: 135 mmol/L (ref 135–145)

## 2020-05-22 LAB — MAGNESIUM: Magnesium: 1.8 mg/dL (ref 1.7–2.4)

## 2020-05-22 MED ORDER — POTASSIUM CHLORIDE CRYS ER 20 MEQ PO TBCR
40.0000 meq | EXTENDED_RELEASE_TABLET | Freq: Once | ORAL | Status: AC
  Administered 2020-05-22: 40 meq via ORAL
  Filled 2020-05-22: qty 2

## 2020-05-22 MED ORDER — POTASSIUM CHLORIDE CRYS ER 20 MEQ PO TBCR
20.0000 meq | EXTENDED_RELEASE_TABLET | Freq: Once | ORAL | Status: AC
  Administered 2020-05-22: 20 meq via ORAL
  Filled 2020-05-22: qty 1

## 2020-05-22 MED ORDER — EMPAGLIFLOZIN 10 MG PO TABS
10.0000 mg | ORAL_TABLET | Freq: Every day | ORAL | Status: DC
  Administered 2020-05-23 – 2020-05-24 (×2): 10 mg via ORAL
  Filled 2020-05-22 (×2): qty 1

## 2020-05-22 NOTE — Progress Notes (Addendum)
PROGRESS NOTE    Jaime Hall  ZOX:[id number] DOB: 1950/09/24 DOA: 05/19/2020 PCP: [name], MD    Brief Narrative:  patient was admitted with chest tightness/heaviness and increasing leg swelling with shortness of breath. She has been taking her Lasix as needed as per recommendation by her oncologist since she is getting chemo for her endometrial cancer to preserve renal function.  12/8-PSVT on tele  Consultants:   cardiology  Procedures:   Antimicrobials:       Subjective: Pt had a run of tachycardia at 180bpm and that time felt dizzy as if going to pass out. No other complaints.  Objective: Vitals:   05/21/20 1640 05/21/20 2003 05/21/20 2343 05/22/20 0300  BP: (!) 94/49 (!) 101/47 (!) 106/56 (!) 91/47  Pulse: 72 69 76 70  Resp: 18 19 18 14   Temp: 97.9 F (36.6 C) 98.9 F (37.2 C) 98.7 F (37.1 C) 98 F (36.7 C)  TempSrc: Oral Oral Oral Oral  SpO2: 97% 96% 96% 95%  Weight:    67.5 kg  Height:        Intake/Output Summary (Last 24 hours) at 05/22/2020 0835 Last data filed at 05/21/2020 1930 Gross per 24 hour  Intake 483 ml  Output 1300 ml  Net -817 ml   Filed Weights   05/20/20 0419 05/21/20 0323 05/22/20 0300  Weight: 67.3 kg 68.3 kg 67.5 kg    Examination:  General exam: Appears calm and comfortable  Respiratory system: Clear to auscultation. Respiratory effort normal. Cardiovascular system: S1 & S2 heard, RRR. No JVD, murmurs, rubs, gallops or clicks.  Gastrointestinal system: Abdomen is nondistended, soft and nontender.  Normal bowel sounds heard. Central nervous system: Alert and oriented. No focal neurological deficits. Extremities: no edema Skin:warm, dry Psychiatry: Judgement and insight appear normal. Mood & affect appropriate.     Data Reviewed: I have personally reviewed following labs and imaging studies  CBC: Recent Labs  Lab 05/17/20 0833 05/19/20 0709 05/20/20 0608  WBC 8.7 10.9* 7.9  NEUTROABS 6.8  --   --   HGB  9.6* 10.7* 10.5*  HCT 30.4* 33.3* 31.4*  MCV 89.9 89.3 85.1  PLT 255 291 811   Basic Metabolic Panel: Recent Labs  Lab 05/17/20 0833 05/19/20 0709 05/20/20 0608 05/21/20 0630 05/22/20 0440  NA 138 138 137 136 135  K 3.4* 4.1 3.6 3.4* 3.2*  CL 101 102 97* 95* 98  CO2 23 25 29 29 27   GLUCOSE 125* 131* 109* 116* 108*  BUN 12 11 20  32* 33*  CREATININE 1.19* 1.18* 1.52* 1.91* 1.75*  CALCIUM 8.8* 9.1 8.9 8.7* 8.7*  MG  --   --  1.7 1.8 1.8   GFR: Estimated Creatinine Clearance: 26.8 mL/min (A) (by C-G formula based on SCr of 1.75 mg/dL (H)). Liver Function Tests: Recent Labs  Lab 05/17/20 0833 05/19/20 0709  AST 18 22  ALT 10 9  ALKPHOS 76 81  BILITOT 0.8 1.1  PROT 6.4* 6.8  ALBUMIN 3.2* 3.3*   No results for input(s): LIPASE, AMYLASE in the last 168 hours. No results for input(s): AMMONIA in the last 168 hours. Coagulation Profile: No results for input(s): INR, PROTIME in the last 168 hours. Cardiac Enzymes: No results for input(s): CKTOTAL, CKMB, CKMBINDEX, TROPONINI in the last 168 hours. BNP (last 3 results) No results for input(s): PROBNP in the last 8760 hours. HbA1C: No results for input(s): HGBA1C in the last 72 hours. CBG: No results for input(s): GLUCAP in the last  168 hours. Lipid Profile: No results for input(s): CHOL, HDL, LDLCALC, TRIG, CHOLHDL, LDLDIRECT in the last 72 hours. Thyroid Function Tests: No results for input(s): TSH, T4TOTAL, FREET4, T3FREE, THYROIDAB in the last 72 hours. Anemia Panel: No results for input(s): VITAMINB12, FOLATE, FERRITIN, TIBC, IRON, RETICCTPCT in the last 72 hours. Sepsis Labs: No results for input(s): PROCALCITON, LATICACIDVEN in the last 168 hours.  Recent Results (from the past 240 hour(s))  Resp Panel by RT-PCR (Flu A&B, Covid) Nasopharyngeal Swab     Status: None   Collection Time: 05/19/20  7:41 AM   Specimen: Nasopharyngeal Swab; Nasopharyngeal(NP) swabs in vial transport medium  Result Value Ref Range  Status   SARS Coronavirus 2 by RT PCR NEGATIVE NEGATIVE Final    Comment: (NOTE) SARS-CoV-2 target nucleic acids are NOT DETECTED.  The SARS-CoV-2 RNA is generally detectable in upper respiratory specimens during the acute phase of infection. The lowest concentration of SARS-CoV-2 viral copies this assay can detect is 138 copies/mL. A negative result does not preclude SARS-Cov-2 infection and should not be used as the sole basis for treatment or other patient management decisions. A negative result may occur with  improper specimen collection/handling, submission of specimen other than nasopharyngeal swab, presence of viral mutation(s) within the areas targeted by this assay, and inadequate number of viral copies(<138 copies/mL). A negative result must be combined with clinical observations, patient history, and epidemiological information. The expected result is Negative.  Fact Sheet for Patients:  EntrepreneurPulse.com.au  Fact Sheet for Healthcare Providers:  IncredibleEmployment.be  This test is no t yet approved or cleared by the [place] FDA and  has been authorized for detection and/or diagnosis of SARS-CoV-2 by FDA under an Emergency Use Authorization (EUA). This EUA will remain  in effect (meaning this test can be used) for the duration of the COVID-19 declaration under Section 564(b)(1) of the Act, 21 U.S.C.section 360bbb-3(b)(1), unless the authorization is terminated  or revoked sooner.       Influenza A by PCR NEGATIVE NEGATIVE Final   Influenza B by PCR NEGATIVE NEGATIVE Final    Comment: (NOTE) The Xpert Xpress SARS-CoV-2/FLU/RSV plus assay is intended as an aid in the diagnosis of influenza from Nasopharyngeal swab specimens and should not be used as a sole basis for treatment. Nasal washings and aspirates are unacceptable for Xpert Xpress SARS-CoV-2/FLU/RSV testing.  Fact Sheet for  Patients: EntrepreneurPulse.com.au  Fact Sheet for Healthcare Providers: IncredibleEmployment.be  This test is not yet approved or cleared by the [place] FDA and has been authorized for detection and/or diagnosis of SARS-CoV-2 by FDA under an Emergency Use Authorization (EUA). This EUA will remain in effect (meaning this test can be used) for the duration of the COVID-19 declaration under Section 564(b)(1) of the Act, 21 U.S.C. section 360bbb-3(b)(1), unless the authorization is terminated or revoked.  Performed at [facility], [address] [place], [place] [address]          Radiology Studies: ECHOCARDIOGRAM COMPLETE  Result Date: 05/20/2020    ECHOCARDIOGRAM REPORT   [name] Date of Exam: 05/20/2020 Medical Rec #:  [id number]         Height:       61.7 in Accession #:    [id number]        Weight:       148.4 lb Date of Birth:  11/08/1950         BSA:  1.679 m Patient Age:    69 years          BP:           98/54 mmHg Patient Gender: F                 HR:           59 bpm. Exam Location:  [facility] Procedure: 2D Echo, Cardiac Doppler and Color Doppler Indications:     CHF- acute systolic 564.33  History:         Patient has prior history of Echocardiogram examinations, most                  recent 12/29/2019. CHF, Signs/Symptoms:Murmur; Risk                  Factors:Hypertension.  Sonographer:     Sherrie Sport RDCS (AE) Referring Phys:  [id number] Soledad Gerlach NIU Diagnosing Phys: Bartholome Bill MD  Sonographer Comments: Global longitudinal strain was attempted. IMPRESSIONS  1. Left ventricular ejection fraction, by estimation, is <20%. The left ventricle has severely decreased function. The left ventricle demonstrates global hypokinesis. The left ventricular internal cavity size was mildly dilated. Left ventricular diastolic parameters were normal.  2. Right ventricular systolic function is normal. The right ventricular size  is normal.  3. Left atrial size was mildly dilated.  4. The mitral valve was not well visualized. Mild mitral valve regurgitation.  5. The aortic valve was not well visualized. Aortic valve regurgitation is mild to moderate. Mild to moderate aortic valve sclerosis/calcification is present, without any evidence of aortic stenosis. FINDINGS  Left Ventricle: Left ventricular ejection fraction, by estimation, is <20%. The left ventricle has severely decreased function. The left ventricle demonstrates global hypokinesis. The left ventricular internal cavity size was mildly dilated. There is no  left ventricular hypertrophy. Left ventricular diastolic parameters were normal. Right Ventricle: The right ventricular size is normal. No increase in right ventricular wall thickness. Right ventricular systolic function is normal. Left Atrium: Left atrial size was mildly dilated. Right Atrium: Right atrial size was normal in size. Pericardium: There is no evidence of pericardial effusion. Mitral Valve: The mitral valve was not well visualized. Mild mitral valve regurgitation. Tricuspid Valve: The tricuspid valve is not well visualized. Tricuspid valve regurgitation is mild. Aortic Valve: The aortic valve was not well visualized. Aortic valve regurgitation is mild to moderate. Aortic regurgitation PHT measures 562 msec. Mild to moderate aortic valve sclerosis/calcification is present, without any evidence of aortic stenosis.  Aortic valve mean gradient measures 4.7 mmHg. Aortic valve peak gradient measures 8.1 mmHg. Aortic valve area, by VTI measures 1.44 cm. Pulmonic Valve: The pulmonic valve was not well visualized. Pulmonic valve regurgitation is trivial. Aorta: The aortic root is normal in size and structure. IAS/Shunts: The interatrial septum was not assessed.  LEFT VENTRICLE PLAX 2D LVIDd:         5.22 cm      Diastology LVIDs:         4.98 cm      LV e' medial:    3.92 cm/s LV PW:         1.16 cm      LV E/e' medial:  19.9  LV IVS:        0.88 cm      LV e' lateral:   5.33 cm/s LVOT diam:     2.00 cm      LV E/e' lateral: 14.7 LV SV:  33 LV SV Index:   20 LVOT Area:     3.14 cm  LV Volumes (MOD) LV vol d, MOD A2C: 110.0 ml LV vol d, MOD A4C: 91.7 ml LV vol s, MOD A2C: 68.9 ml LV vol s, MOD A4C: 74.8 ml LV SV MOD A2C:     41.1 ml LV SV MOD A4C:     91.7 ml LV SV MOD BP:      30.1 ml RIGHT VENTRICLE RV Basal diam:  2.25 cm RV S prime:     13.10 cm/s TAPSE (M-mode): 2.9 cm LEFT ATRIUM             Index       RIGHT ATRIUM           Index LA diam:        3.80 cm 2.26 cm/m  RA Area:     10.70 cm LA Vol (A2C):   66.0 ml 39.32 ml/m RA Volume:   22.80 ml  13.58 ml/m LA Vol (A4C):   48.2 ml 28.72 ml/m LA Biplane Vol: 57.9 ml 34.49 ml/m  AORTIC VALVE                   PULMONIC VALVE AV Area (Vmax):    1.27 cm    PV Vmax:        0.74 m/s AV Area (Vmean):   1.09 cm    PV Peak grad:   2.2 mmHg AV Area (VTI):     1.44 cm    RVOT Peak grad: 2 mmHg AV Vmax:           142.33 cm/s AV Vmean:          96.833 cm/s AV VTI:            0.231 m AV Peak Grad:      8.1 mmHg AV Mean Grad:      4.7 mmHg LVOT Vmax:         57.60 cm/s LVOT Vmean:        33.700 cm/s LVOT VTI:          0.106 m LVOT/AV VTI ratio: 0.46 AI PHT:            562 msec  AORTA Ao Root diam: 2.70 cm MITRAL VALVE               TRICUSPID VALVE MV Area (PHT): 4.31 cm    TR Peak grad:   17.3 mmHg MV Decel Time: 176 msec    TR Vmax:        208.00 cm/s MV E velocity: 78.10 cm/s MV A velocity: 80.50 cm/s  SHUNTS MV E/A ratio:  0.97        Systemic VTI:  0.11 m                            Systemic Diam: 2.00 cm Bartholome Bill MD Electronically signed by Bartholome Bill MD Signature Date/Time: 05/20/2020/11:59:25 AM    Final         Scheduled Meds: . amitriptyline  25 mg Oral QHS  . apixaban  2.5 mg Oral BID  . atorvastatin  10 mg Oral QPM  . cholecalciferol  1,000 Units Oral Daily  . ivabradine  5 mg Oral BID WC  . levothyroxine  75 mcg Oral QAC breakfast  . pantoprazole  20 mg  Oral Daily  . sodium chloride flush  3 mL Intravenous Q12H  Continuous Infusions: . sodium chloride      Assessment & Plan:   Principal Problem:   Acute on chronic combined systolic and diastolic CHF (congestive heart failure) (HCC) Active Problems:   Coronary artery disease   Endometrial cancer (HCC)   Normocytic anemia   Leukocytosis   HTN (hypertension)   HLD (hyperlipidemia)   CKD (chronic kidney disease), stage IIIa   Depression with anxiety   Hypothyroidism   PE (pulmonary thromboembolism) (HCC)   GERD (gastroesophageal reflux disease)   UTI (urinary tract infection)   Phylis Bougie Wagoneris a 69 y.o.femalewith medical history significant ofCHF with EF 30-35%, hypertension, hyperlipidemia, GERD, hypothyroidism, depression with anxiety, polycystic ovary disease, Hodgkin's lymphoma (s/p of radiation and chemotherapy), endometrial cancer with peritoneal carcinomatosis, (s/p for hysterectomy, currently on Lenvima),CAD with a stent placement, PE on Eliquis, PAD, who presents with shortness breath. Patient states she has shortness breath in the past 2 days, which has has been progressively since last night.Patient has chestheaviness and dry cough.  Acute on chronic combined systolic and diastolic CHF (congestive heart failure) ([place]): --Patient has 1+ leg edema, positive JVD,elevated BNP 1545, chest x-ray showed bilateral pulmonary edema,clinically consistent with CHF exacerbation. -- Patient recently has not been taking her Lasix on a daily basis in order to preserve renal function as suggested by her oncologist given the need chemotherapy she has been getting. She watches her salt diet. -- Received IV Lasix 20 mg BID --12/7-- holding Lasix and Coreg today due to soft blood pressure. 12/8-Cardiology following Had run of tachycardia on tele.-was symptomatic. Will d/w cardiology On Ivabradine Appears euvolemic on exam. Low bp prevents additiion of chf meds, will  monitor I's and O's, daily weight   Coronary artery disease/PCI to LAD  Continue statin   Recurrent Endometrial cancer ([place]):With peritoneal carcinomatosis. -Patient was started Lenvima on 11/1, but the patient is currently not taking this medication. 12/8- f/u with oncology Dr. Janese Banks.  She got Keytruda 2 days ago  Hypokalemia-mild K is 3.2 We will replace Monitor levels  Normocytic anemia: Hemoglobin stable, 10.5  Leukocytosisand recent BEE:FEOFHQRF.  WBC 7.9.  Ptison Keflex for UTI since 05/09/20. Nosymptoms of UTI today -finished rx -UC from 11/25 --multiple species  HTN (hypertension) -IV hydralazine as needed -Coreg,Cozaar -Patient is on IV Lasix  HLD (hyperlipidemia) -Lipitor  CKD (chronic kidney disease), stage IIIa:stable -Follow-up byBMP  Depression with anxiety -Amitriptyline HPI abdomen  Hypothyroidism -Synthroid  PE (pulmonary thromboembolism) (HCC) -Continue Eliquis  GERD (gastroesophageal reflux disease) -Protonix   DVT prophylaxis: Eliquis Code Status: DNR Family Communication: None at bedside  Status is: Inpatient  Remains inpatient appropriate because:Inpatient level of care appropriate due to severity of illness   Dispo: The patient is from: Home              Anticipated d/c is to: Home              Anticipated d/c date is: 2 days              Patient currently is not medically stable to d/c.Had tachycardia, was symptomatic. bp low.             LOS: 3 days   Time spent: 35 minutes with more than 50% on Anderson, MD [facility] Pager [phone]  If 7PM-7AM, please contact night-coverage 05/22/2020, 8:35 AM

## 2020-05-22 NOTE — Progress Notes (Addendum)
Progress Note  Patient Name: Jaime Hall Date of Encounter: 05/22/2020  [facility] Cardiologist: [name], MD   Subjective   States breathing is better compared to admission.  Creatinine continues to improve with holding Lasix, net -817 cc over the past 24 hours.  Inpatient Medications    Scheduled Meds: . amitriptyline  25 mg Oral QHS  . apixaban  2.5 mg Oral BID  . atorvastatin  10 mg Oral QPM  . cholecalciferol  1,000 Units Oral Daily  . [START ON 05/23/2020] empagliflozin  10 mg Oral Daily  . ivabradine  5 mg Oral BID WC  . levothyroxine  75 mcg Oral QAC breakfast  . pantoprazole  20 mg Oral Daily  . potassium chloride  20 mEq Oral Once  . sodium chloride flush  3 mL Intravenous Q12H   Continuous Infusions: . sodium chloride     PRN Meds: sodium chloride, acetaminophen, albuterol, dextromethorphan-guaiFENesin, hydrOXYzine, LORazepam, losartan, sodium chloride flush   Vital Signs    Vitals:   05/21/20 2343 05/22/20 0300 05/22/20 0900 05/22/20 1120  BP: (!) 106/56 (!) 91/47 (!) 95/44 (!) 104/53  Pulse: 76 70 70 76  Resp: 18 14 19  (!) 21  Temp: 98.7 F (37.1 C) 98 F (36.7 C) 98.1 F (36.7 C) 98.4 F (36.9 C)  TempSrc: Oral Oral Oral Oral  SpO2: 96% 95% 98% 100%  Weight:  67.5 kg    Height:        Intake/Output Summary (Last 24 hours) at 05/22/2020 1406 Last data filed at 05/22/2020 1037 Gross per 24 hour  Intake 480 ml  Output 1300 ml  Net -820 ml   Last 3 Weights 05/22/2020 05/21/2020 05/20/2020  Weight (lbs) 148 lb 13 oz 150 lb 8 oz 148 lb 6.4 oz  Weight (kg) 67.5 kg 68.266 kg 67.314 kg      Telemetry    Sinus rhythm, parox SVT around 930am- Personally Reviewed  ECG    No new tracing- Personally Reviewed  Physical Exam   GEN: No acute distress.   Neck: No JVD Cardiac: RRR, no murmurs, rubs, or gallops.  Respiratory: Clear anteriorly GI: Soft, nontender, non-distended  MS:  1-2+ lymphedema Neuro:  Nonfocal  Psych: Normal  affect   Labs    High Sensitivity Troponin:   Recent Labs  Lab 05/09/20 0031 05/09/20 0242 05/19/20 0709  TROPONINIHS 13 19* 14      Chemistry Recent Labs  Lab 05/17/20 0833 05/17/20 0833 05/19/20 0709 05/19/20 0709 05/20/20 0608 05/21/20 0630 05/22/20 0440  NA 138   < > 138   < > 137 136 135  K 3.4*   < > 4.1   < > 3.6 3.4* 3.2*  CL 101   < > 102   < > 97* 95* 98  CO2 23   < > 25   < > 29 29 27   GLUCOSE 125*   < > 131*   < > 109* 116* 108*  BUN 12   < > 11   < > 20 32* 33*  CREATININE 1.19*   < > 1.18*   < > 1.52* 1.91* 1.75*  CALCIUM 8.8*   < > 9.1   < > 8.9 8.7* 8.7*  PROT 6.4*  --  6.8  --   --   --   --   ALBUMIN 3.2*  --  3.3*  --   --   --   --   AST 18  --  22  --   --   --   --  ALT 10  --  9  --   --   --   --   ALKPHOS 76  --  81  --   --   --   --   BILITOT 0.8  --  1.1  --   --   --   --   GFRNONAA 49*   < > 50*   < > 37* 28* 31*  ANIONGAP 14   < > 11   < > 11 12 10    < > = values in this interval not displayed.     Hematology Recent Labs  Lab 05/17/20 0833 05/19/20 0709 05/20/20 0608  WBC 8.7 10.9* 7.9  RBC 3.38* 3.73* 3.69*  HGB 9.6* 10.7* 10.5*  HCT 30.4* 33.3* 31.4*  MCV 89.9 89.3 85.1  MCH 28.4 28.7 28.5  MCHC 31.6 32.1 33.4  RDW 15.8* 15.4 15.3  PLT 255 291 240    BNP Recent Labs  Lab 05/19/20 0709  BNP 1,545.5*     DDimer No results for input(s): DDIMER in the last 168 hours.   Radiology    No results found.  Cardiac Studies   echo 05/20/2020: 1. Left ventricular ejection fraction, by estimation, is <20%. The left  ventricle has severely decreased function. The left ventricle demonstrates  global hypokinesis. The left ventricular internal cavity size was mildly  dilated. Left ventricular  diastolic parameters were normal.  2. Right ventricular systolic function is normal. The right ventricular  size is normal.  3. Left atrial size was mildly dilated.  4. The mitral valve was not well visualized. Mild mitral  valve  regurgitation.  5. The aortic valve was not well visualized. Aortic valve regurgitation  is mild to moderate. Mild to moderate aortic valve sclerosis/calcification  is present, without any evidence of aortic stenosis.  Patient Profile     69 y.o. female with history of CAD/PCI DES to P LAD, nonischemic cardiomyopathy, EF less than 20% secondary to chemo, CKD stage III, PE on Eliquis presenting with volume overload.  Assessment & Plan    1.  NICM EF less than 20% -Appears euvolemic on exam -Creatinine improving with holding Lasix -Net -813 cc -Continue to hold Lasix, monitor creatinine, keep patient net even to net negative. -cont Ivabradine 5mg  bid -Low blood pressures preventing addition of CHF meds.  2.  CAD/PCI to pLAD -Eliquis, Lipitor  3.  Multiple malignancies, -Management as per oncology  4.  History of PE -On Eliquis low-dose  5. parox svt on tele -cont to monitor -replete K >4, Mg >2 -low BP prevents addition of BB, amio etc  Total encounter time 35 minutes  Greater than 50% was spent in counseling and coordination of care with the patient     Signed, Kate Sable, MD  05/22/2020, 2:06 PM

## 2020-05-22 NOTE — Progress Notes (Signed)
Made aware by CCMD patients heart rate went up to 180 for a few seconds. Upon entering room patient sitting up in the bad. Patient stated she did feel lightheaded and faint for a short time. Current heart rate 80's. Dr Kurtis Bushman was made aware, no new orders at this time. Will continue to monitor closely

## 2020-05-22 NOTE — Care Management Important Message (Signed)
Important Message  Patient Details  Name: Jaime Hall MRN: [id number] Date of Birth: 15-Jul-1950   Medicare Important Message Given:  Yes     Dannette Barbara 05/22/2020, 11:27 AM

## 2020-05-23 DIAGNOSIS — N1831 Chronic kidney disease, stage 3a: Secondary | ICD-10-CM

## 2020-05-23 DIAGNOSIS — C541 Malignant neoplasm of endometrium: Secondary | ICD-10-CM

## 2020-05-23 DIAGNOSIS — I25118 Atherosclerotic heart disease of native coronary artery with other forms of angina pectoris: Secondary | ICD-10-CM

## 2020-05-23 DIAGNOSIS — R0602 Shortness of breath: Secondary | ICD-10-CM

## 2020-05-23 LAB — BASIC METABOLIC PANEL
Anion gap: 10 (ref 5–15)
BUN: 26 mg/dL — ABNORMAL HIGH (ref 8–23)
CO2: 26 mmol/L (ref 22–32)
Calcium: 9 mg/dL (ref 8.9–10.3)
Chloride: 104 mmol/L (ref 98–111)
Creatinine, Ser: 1.57 mg/dL — ABNORMAL HIGH (ref 0.44–1.00)
GFR, Estimated: 35 mL/min — ABNORMAL LOW (ref >60)
Glucose, Bld: 102 mg/dL — ABNORMAL HIGH (ref 70–99)
Potassium: 4.7 mmol/L (ref 3.5–5.1)
Sodium: 140 mmol/L (ref 135–145)

## 2020-05-23 LAB — MAGNESIUM: Magnesium: 1.9 mg/dL (ref 1.7–2.4)

## 2020-05-23 LAB — BRAIN NATRIURETIC PEPTIDE: B Natriuretic Peptide: 500.2 pg/mL — ABNORMAL HIGH (ref 0.0–100.0)

## 2020-05-23 NOTE — Progress Notes (Signed)
PROGRESS NOTE    Jaime Hall  YDX:[id number] DOB: [birth year] DOA: 05/19/2020 PCP: [name], MD    Brief Narrative:  patient was admitted with chest tightness/heaviness and increasing leg swelling with shortness of breath. She has been taking her Lasix as needed as per recommendation by her oncologist since she is getting chemo for her endometrial cancer to preserve renal function.  12/8-PSVT on tele 12/9-upon walking pt did drop 02 to 87% for brief moment. Tele- sr  Consultants:   cardiology  Procedures:   Antimicrobials:       Subjective: Feeling better, but gets dizzy intermittently  Objective: Vitals:   05/22/20 2002 05/23/20 0510 05/23/20 0548 05/23/20 0805  BP: (!) 104/46 (!) 86/38 (!) 94/42 (!) 100/46  Pulse: 72 64  65  Resp: 17 20  20   Temp: 98.4 F (36.9 C) 98 F (36.7 C)  97.6 F (36.4 C)  TempSrc: Oral Oral  Oral  SpO2: 100% 95%  99%  Weight:   68.9 kg   Height:        Intake/Output Summary (Last 24 hours) at 05/23/2020 0825 Last data filed at 05/22/2020 1540 Gross per 24 hour  Intake 240 ml  Output 350 ml  Net -110 ml   Filed Weights   05/21/20 0323 05/22/20 0300 05/23/20 0548  Weight: 68.3 kg 67.5 kg 68.9 kg    Examination:  Comfortable, NAD Decreased breath sounds at bases, no wheezing Regular S1-S2 no rubs or gallops Soft benign positive bowel sounds No edema Alert oriented x3, grossly intact   Data Reviewed: I have personally reviewed following labs and imaging studies  CBC: Recent Labs  Lab 05/17/20 0833 05/19/20 0709 05/20/20 0608  WBC 8.7 10.9* 7.9  NEUTROABS 6.8  --   --   HGB 9.6* 10.7* 10.5*  HCT 30.4* 33.3* 31.4*  MCV 89.9 89.3 85.1  PLT 255 291 720   Basic Metabolic Panel: Recent Labs  Lab 05/19/20 0709 05/20/20 0608 05/21/20 0630 05/22/20 0440 05/23/20 0435  NA 138 137 136 135 140  K 4.1 3.6 3.4* 3.2* 4.7  CL 102 97* 95* 98 104  CO2 25 29 29 27 26   GLUCOSE 131* 109* 116* 108* 102*  BUN 11 20  32* 33* 26*  CREATININE 1.18* 1.52* 1.91* 1.75* 1.57*  CALCIUM 9.1 8.9 8.7* 8.7* 9.0  MG  --  1.7 1.8 1.8 1.9   GFR: Estimated Creatinine Clearance: 30.2 mL/min (A) (by C-G formula based on SCr of 1.57 mg/dL (H)). Liver Function Tests: Recent Labs  Lab 05/17/20 0833 05/19/20 0709  AST 18 22  ALT 10 9  ALKPHOS 76 81  BILITOT 0.8 1.1  PROT 6.4* 6.8  ALBUMIN 3.2* 3.3*   No results for input(s): LIPASE, AMYLASE in the last 168 hours. No results for input(s): AMMONIA in the last 168 hours. Coagulation Profile: No results for input(s): INR, PROTIME in the last 168 hours. Cardiac Enzymes: No results for input(s): CKTOTAL, CKMB, CKMBINDEX, TROPONINI in the last 168 hours. BNP (last 3 results) No results for input(s): PROBNP in the last 8760 hours. HbA1C: No results for input(s): HGBA1C in the last 72 hours. CBG: No results for input(s): GLUCAP in the last 168 hours. Lipid Profile: No results for input(s): CHOL, HDL, LDLCALC, TRIG, CHOLHDL, LDLDIRECT in the last 72 hours. Thyroid Function Tests: No results for input(s): TSH, T4TOTAL, FREET4, T3FREE, THYROIDAB in the last 72 hours. Anemia Panel: No results for input(s): VITAMINB12, FOLATE, FERRITIN, TIBC, IRON, RETICCTPCT in the last  72 hours. Sepsis Labs: No results for input(s): PROCALCITON, LATICACIDVEN in the last 168 hours.  Recent Results (from the past 240 hour(s))  Resp Panel by RT-PCR (Flu A&B, Covid) Nasopharyngeal Swab     Status: None   Collection Time: 05/19/20  7:41 AM   Specimen: Nasopharyngeal Swab; Nasopharyngeal(NP) swabs in vial transport medium  Result Value Ref Range Status   SARS Coronavirus 2 by RT PCR NEGATIVE NEGATIVE Final    Comment: (NOTE) SARS-CoV-2 target nucleic acids are NOT DETECTED.  The SARS-CoV-2 RNA is generally detectable in upper respiratory specimens during the acute phase of infection. The lowest concentration of SARS-CoV-2 viral copies this assay can detect is 138 copies/mL. A  negative result does not preclude SARS-Cov-2 infection and should not be used as the sole basis for treatment or other patient management decisions. A negative result may occur with  improper specimen collection/handling, submission of specimen other than nasopharyngeal swab, presence of viral mutation(s) within the areas targeted by this assay, and inadequate number of viral copies(<138 copies/mL). A negative result must be combined with clinical observations, patient history, and epidemiological information. The expected result is Negative.  Fact Sheet for Patients:  EntrepreneurPulse.com.au  Fact Sheet for Healthcare Providers:  IncredibleEmployment.be  This test is no t yet approved or cleared by the [place] FDA and  has been authorized for detection and/or diagnosis of SARS-CoV-2 by FDA under an Emergency Use Authorization (EUA). This EUA will remain  in effect (meaning this test can be used) for the duration of the COVID-19 declaration under Section 564(b)(1) of the Act, 21 U.S.C.section 360bbb-3(b)(1), unless the authorization is terminated  or revoked sooner.       Influenza A by PCR NEGATIVE NEGATIVE Final   Influenza B by PCR NEGATIVE NEGATIVE Final    Comment: (NOTE) The Xpert Xpress SARS-CoV-2/FLU/RSV plus assay is intended as an aid in the diagnosis of influenza from Nasopharyngeal swab specimens and should not be used as a sole basis for treatment. Nasal washings and aspirates are unacceptable for Xpert Xpress SARS-CoV-2/FLU/RSV testing.  Fact Sheet for Patients: EntrepreneurPulse.com.au  Fact Sheet for Healthcare Providers: IncredibleEmployment.be  This test is not yet approved or cleared by the [place] FDA and has been authorized for detection and/or diagnosis of SARS-CoV-2 by FDA under an Emergency Use Authorization (EUA). This EUA will remain in effect (meaning this test can  be used) for the duration of the COVID-19 declaration under Section 564(b)(1) of the Act, 21 U.S.C. section 360bbb-3(b)(1), unless the authorization is terminated or revoked.  Performed at [facility], [address] [place], [place] [address]          Radiology Studies: No results found.      Scheduled Meds: . amitriptyline  25 mg Oral QHS  . apixaban  2.5 mg Oral BID  . atorvastatin  10 mg Oral QPM  . cholecalciferol  1,000 Units Oral Daily  . empagliflozin  10 mg Oral Daily  . ivabradine  5 mg Oral BID WC  . levothyroxine  75 mcg Oral QAC breakfast  . pantoprazole  20 mg Oral Daily  . sodium chloride flush  3 mL Intravenous Q12H   Continuous Infusions: . sodium chloride      Assessment & Plan:   Principal Problem:   Acute on chronic combined systolic and diastolic CHF (congestive heart failure) (HCC) Active Problems:   Coronary artery disease   Endometrial cancer (HCC)   Normocytic anemia   Leukocytosis   HTN (hypertension)  HLD (hyperlipidemia)   CKD (chronic kidney disease), stage IIIa   Depression with anxiety   Hypothyroidism   PE (pulmonary thromboembolism) (HCC)   GERD (gastroesophageal reflux disease)   UTI (urinary tract infection)   Phylis Bougie Wagoneris a 69 y.o.femalewith medical history significant ofCHF with EF 30-35%, hypertension, hyperlipidemia, GERD, hypothyroidism, depression with anxiety, polycystic ovary disease, Hodgkin's lymphoma (s/p of radiation and chemotherapy), endometrial cancer with peritoneal carcinomatosis, (s/p for hysterectomy, currently on Lenvima),CAD with a stent placement, PE on Eliquis, PAD, who presents with shortness breath.   Acute on chronic combined systolic and diastolic CHF (congestive heart failure) ([place]): --Patient has 1+ leg edema, positive JVD,elevated BNP 1545, chest x-ray showed bilateral pulmonary edema,clinically consistent with CHF exacerbation. -- Patient recently has not been  taking her Lasix on a daily basis in order to preserve renal function as suggested by her oncologist given the need chemotherapy she has been getting. She watches her salt diet. -- Received IV Lasix 20 mg BID-now held 12/9-continue to hold Lasix and Coreg due to soft blood pressure Cardiology following-added Empagliflozin yesterday, concern it may be contributing to hyotention-low threshold to dc-defer to cardiology Continue ivabradine May need to use lasix prn based on wt gain     Coronary artery disease/PCI to LAD  Continue statins   Recurrent Endometrial cancer [facility]):With peritoneal carcinomatosis. -Patient was started Lenvima on 11/1, but the patient is currently not taking this medication. 12/9 -follow-up with oncology Dr. Janese Banks.  She got Keytruda few days ago   Hypokalemia Supplemented, stable now Continue to monitor    Normocytic anemia: Hemoglobin remained stable, continue to monitor periodically  Leukocytosisand recent TIR:WERXVQMG.  WBC 7.9.  Ptison Keflex for UTI since 05/09/20. Nosymptoms of UTI today -finished rx -UC from 11/25 --multiple species  HTN (hypertension) BP on low side. Continue Cozaar  HLD (hyperlipidemia) -Lipitor  CKD (chronic kidney disease), stage IIIa:stable -Follow-up byBMP  Depression with anxiety -Amitriptyline HPI abdomen  Hypothyroidism -Synthroid  PE (pulmonary thromboembolism) (HCC) -Continue Eliquis  GERD (gastroesophageal reflux disease) -Protonix   DVT prophylaxis: Eliquis Code Status: DNR Family Communication: None at bedside  Status is: Inpatient  Remains inpatient appropriate because:Inpatient level of care appropriate due to severity of illness   Dispo: The patient is from: Home              Anticipated d/c is to: Home              Anticipated d/c date is: 1              Patient currently is not medically stable to d/c. needs one more day monitoring as bp still very low and 02 sat  dropped with ambulation. Monitoring on current meds.            LOS: 4 days   Time spent: 35 minutes with more than 50% on Navarro, MD [facility] Pager [phone]  If 7PM-7AM, please contact night-coverage 05/23/2020, 8:25 AM

## 2020-05-23 NOTE — Progress Notes (Signed)
Mobility Specialist - Progress Note   05/23/20 1400  Mobility  Activity Ambulated in hall;Ambulated to bathroom  Level of Assistance Independent  Assistive Device None  Distance Ambulated (ft) 210 ft  Mobility Response Tolerated well  Mobility performed by Mobility specialist  $Mobility charge 1 Mobility    Pre-mobility: 82 HR, 98% SpO2 During mobility: 87 HR, 94% SpO2 Post-mobility: 81 HR, 100% SpO2   Pt was lying in bed upon arrival utilizing room air. Pt agreed to session. Pt denied any pain, nausea, or fatigue. Pt requested to use restroom prior to activity. Pt independent in peri-care, O2 desat to 88%. Pt denied SOB. Pt was instructed to utilize seated rest break and PLB to conserve energy and increase saturations. O2 sat up to 99% prior to activity. Pt proceeded to ambulation in hallway I'ly without AD. No LOB noted. Pt's O2 > 93% throughout most of ambulation. During the last 40', pt's O2 desat to 87% for a brief moment and immediately sat up to high 90s. Pt continued denying SOB. No heavy breathing noted. Overall, pt tolerated session well. Pt was left in bed with all needs in reach. Nurse notified of performance.   Kathee Delton Mobility Specialist 05/23/20, 3:00 PM

## 2020-05-23 NOTE — Plan of Care (Signed)

## 2020-05-23 NOTE — Progress Notes (Signed)
Progress Note  Patient Name: Jaime Hall Date of Encounter: 05/23/2020  Primary Cardiologist: [name], MD  Subjective   No chest pain or shortness of breath.  Has noted intermittent lightheadedness/presyncope.  This is a little more likely to occur with position changes but can occur just while lying down as well.  Blood pressures have remained soft.  Inpatient Medications    Scheduled Meds: . amitriptyline  25 mg Oral QHS  . apixaban  2.5 mg Oral BID  . atorvastatin  10 mg Oral QPM  . cholecalciferol  1,000 Units Oral Daily  . empagliflozin  10 mg Oral Daily  . ivabradine  5 mg Oral BID WC  . levothyroxine  75 mcg Oral QAC breakfast  . pantoprazole  20 mg Oral Daily  . sodium chloride flush  3 mL Intravenous Q12H   Continuous Infusions: . sodium chloride     PRN Meds: sodium chloride, acetaminophen, albuterol, dextromethorphan-guaiFENesin, hydrOXYzine, LORazepam, losartan, sodium chloride flush   Vital Signs    Vitals:   05/22/20 2002 05/23/20 0510 05/23/20 0548 05/23/20 0805  BP: (!) 104/46 (!) 86/38 (!) 94/42 (!) 100/46  Pulse: 72 64  65  Resp: 17 20  20   Temp: 98.4 F (36.9 C) 98 F (36.7 C)  97.6 F (36.4 C)  TempSrc: Oral Oral  Oral  SpO2: 100% 95%  99%  Weight:   68.9 kg   Height:        Intake/Output Summary (Last 24 hours) at 05/23/2020 1014 Last data filed at 05/22/2020 1540 Gross per 24 hour  Intake 240 ml  Output 350 ml  Net -110 ml   Filed Weights   05/21/20 0323 05/22/20 0300 05/23/20 0548  Weight: 68.3 kg 67.5 kg 68.9 kg    Physical Exam   GEN: Well nourished, well developed, in no acute distress.  HEENT: Grossly normal.  Neck: Supple, no JVD, carotid bruits, or masses. Cardiac: RRR, 2/6 syst murmur @ upper sternal borders, no rubs, or gallops. No clubbing, cyanosis, edema.  DP/PT 2+ and equal bilaterally.  Respiratory:  Respirations regular and unlabored, clear to auscultation bilaterally. GI: Soft, nontender,  nondistended, BS + x 4. MS: no deformity or atrophy. Skin: warm and dry, no rash. Neuro:  Strength and sensation are intact. Psych: AAOx3.  Normal affect.  Labs    Chemistry Recent Labs  Lab 05/17/20 [phone] 05/19/20 0709 05/20/20 0608 05/21/20 0630 05/22/20 0440 05/23/20 0435  NA 138 138   < > 136 135 140  K 3.4* 4.1   < > 3.4* 3.2* 4.7  CL 101 102   < > 95* 98 104  CO2 23 25   < > 29 27 26   GLUCOSE 125* 131*   < > 116* 108* 102*  BUN 12 11   < > 32* 33* 26*  CREATININE 1.19* 1.18*   < > 1.91* 1.75* 1.57*  CALCIUM 8.8* 9.1   < > 8.7* 8.7* 9.0  PROT 6.4* 6.8  --   --   --   --   ALBUMIN 3.2* 3.3*  --   --   --   --   AST 18 22  --   --   --   --   ALT 10 9  --   --   --   --   ALKPHOS 76 81  --   --   --   --   BILITOT 0.8 1.1  --   --   --   --  GFRNONAA 49* 50*   < > 28* 31* 35*  ANIONGAP 14 11   < > 12 10 10    < > = values in this interval not displayed.     Hematology Recent Labs  Lab 05/17/20 0833 05/19/20 0709 05/20/20 0608  WBC 8.7 10.9* 7.9  RBC 3.38* 3.73* 3.69*  HGB 9.6* 10.7* 10.5*  HCT 30.4* 33.3* 31.4*  MCV 89.9 89.3 85.1  MCH 28.4 28.7 28.5  MCHC 31.6 32.1 33.4  RDW 15.8* 15.4 15.3  PLT 255 291 240    Cardiac Enzymes  Recent Labs  Lab 05/09/20 0031 05/09/20 0242 05/19/20 0709  TROPONINIHS 13 19* 14      BNP Recent Labs  Lab 05/19/20 0709 05/23/20 0855  BNP 1,545.5* 500.2*     Lipids  Lab Results  Component Value Date   CHOL 114 12/30/2019   HDL 47 12/30/2019   LDLCALC 41 12/30/2019   TRIG 131 12/30/2019   CHOLHDL 2.4 12/30/2019    HbA1c  Lab Results  Component Value Date   HGBA1C 6.1 (H) 12/30/2019    Radiology    -----------------  Telemetry    RSR - Personally Reviewed  Cardiac Studies   echo 05/20/2020: 1. Left ventricular ejection fraction, by estimation, is <20%. The left  ventricle has severely decreased function. The left ventricle demonstrates  global hypokinesis. The left ventricular internal cavity  size was mildly  dilated. Left ventricular  diastolic parameters were normal.  2. Right ventricular systolic function is normal. The right ventricular  size is normal.  3. Left atrial size was mildly dilated.  4. The mitral valve was not well visualized. Mild mitral valve  regurgitation.  5. The aortic valve was not well visualized. Aortic valve regurgitation  is mild to moderate. Mild to moderate aortic valve sclerosis/calcification  is present, without any evidence of aortic stenosis.  Patient Profile     69 y.o. female with history of CAD/PCI DES to P LAD, nonischemic cardiomyopathy, EF less than 20% secondary to chemo, CKD stage III, PE on Eliquis presenting with volume overload.  Assessment & Plan    1.  NICM/HFrEF:  EF <20%.  No dyspnea/orthopnea.  Notes occas lightheadedness/presyncope - mostly w/ position changes but can occur any time.  Creat improving w/ holding lasix.  Minus 817 yesterday and minus 1.9L since admission.  Wt up sl to 68.9 kg. Euvolemic on exam.  GDMT limited by ongoing relative hypotension.  She is tolerating ivabradine.  Empagliflozin added yesterday, and I am concerned that it may be contributing to hypotension - would have low threshold to d/c.  Was only on lasix 20mg  a few x/wk due to intermittently elevated creat.  Suspect she will only be able to use on a prn basis for wt gain at discharge.  2.  CAD: s/p prior PCI to the LAD.  No c/p.  Cont statin rx.  No asa 2/2 eliquis use.  3.  Multiple malignancies:  Per onc.  4.  H/o PE:  On eliquis.  5.  PSVT:  Quiescent.  K nl this AM.  6.  Hypokalemia:  Nl this AM.  Signed, Murray Hodgkins, NP  05/23/2020, 10:14 AM    For questions or updates, please contact   Please consult [email or web address] for contact info under Cardiology/STEMI.

## 2020-05-24 ENCOUNTER — Telehealth: Payer: Self-pay | Admitting: Cardiovascular Disease

## 2020-05-24 ENCOUNTER — Telehealth: Payer: Self-pay | Admitting: Medical

## 2020-05-24 LAB — BASIC METABOLIC PANEL
Anion gap: 9 (ref 5–15)
BUN: 20 mg/dL (ref 8–23)
CO2: 27 mmol/L (ref 22–32)
Calcium: 8.8 mg/dL — ABNORMAL LOW (ref 8.9–10.3)
Chloride: 102 mmol/L (ref 98–111)
Creatinine, Ser: 1.52 mg/dL — ABNORMAL HIGH (ref 0.44–1.00)
GFR, Estimated: 37 mL/min — ABNORMAL LOW (ref >60)
Glucose, Bld: 106 mg/dL — ABNORMAL HIGH (ref 70–99)
Potassium: 4.5 mmol/L (ref 3.5–5.1)
Sodium: 138 mmol/L (ref 135–145)

## 2020-05-24 LAB — MAGNESIUM: Magnesium: 2 mg/dL (ref 1.7–2.4)

## 2020-05-24 MED ORDER — IVABRADINE HCL 5 MG PO TABS
5.0000 mg | ORAL_TABLET | Freq: Two times a day (BID) | ORAL | 1 refills | Status: DC
Start: 2020-05-24 — End: 2020-05-31

## 2020-05-24 MED ORDER — EMPAGLIFLOZIN 10 MG PO TABS
10.0000 mg | ORAL_TABLET | Freq: Every day | ORAL | 0 refills | Status: DC
Start: 2020-05-25 — End: 2020-05-31

## 2020-05-24 NOTE — Progress Notes (Signed)
Mobility Specialist - Progress Note   05/24/20 1329  Mobility  Activity Ambulated in hall  Level of Assistance Independent  Assistive Device None  Distance Ambulated (ft) 200 ft  Mobility Response Tolerated well  Mobility performed by Mobility specialist  $Mobility charge 1 Mobility    Pre-mobility: 81 HR, 100% SpO2 During mobility: 86 HR, 97% SpO2 Post-mobility: 84 HR, 100% SpO2   Pt was lying in bed upon arrival utilizing room air. Pt agreed to session a second time this date. Still no pain, nausea, or fatigue. Pt ambulated ~200' on 2L [place] O2. Pt denied SOB. No s/s of distress. Denied dizziness or weakness. O2 > 96% throughout activity. Pt was left EOB with physician present. Nurse notified.    Kathee Delton Mobility Specialist 05/24/20, 1:33 PM

## 2020-05-24 NOTE — Telephone Encounter (Signed)
Patient would like an alternative for Jardiance. States she is unable to afford this medicatio. Please call to discuss.

## 2020-05-24 NOTE — Progress Notes (Signed)
Progress Note  Patient Name: Jaime Hall Date of Encounter: 05/24/2020  [facility] Cardiologist: [name], MD   Subjective   Feels well no complaints, shortness of breath dramatically improved Ambulating around the unit on 2 L [employer] better  Inpatient Medications    Scheduled Meds: . amitriptyline  25 mg Oral QHS  . apixaban  2.5 mg Oral BID  . atorvastatin  10 mg Oral QPM  . cholecalciferol  1,000 Units Oral Daily  . empagliflozin  10 mg Oral Daily  . ivabradine  5 mg Oral BID WC  . levothyroxine  75 mcg Oral QAC breakfast  . pantoprazole  20 mg Oral Daily  . sodium chloride flush  3 mL Intravenous Q12H   Continuous Infusions: . sodium chloride     PRN Meds: sodium chloride, acetaminophen, albuterol, dextromethorphan-guaiFENesin, hydrOXYzine, LORazepam, sodium chloride flush   Vital Signs    Vitals:   05/24/20 0400 05/24/20 0435 05/24/20 0828 05/24/20 1155  BP:  (!) 97/46 (!) 95/42 119/62  Pulse:  76 74 77  Resp:   20 18  Temp:  97.9 F (36.6 C) 98 F (36.7 C) 97.6 F (36.4 C)  TempSrc:  Oral Oral Oral  SpO2:  97% 100% 99%  Weight: 68.9 kg     Height:        Intake/Output Summary (Last 24 hours) at 05/24/2020 1410 Last data filed at 05/24/2020 1159 Gross per 24 hour  Intake 240 ml  Output 1700 ml  Net -1460 ml   Last 3 Weights 05/24/2020 05/23/2020 05/22/2020  Weight (lbs) 151 lb 14.4 oz 151 lb 14.4 oz 148 lb 13 oz  Weight (kg) 68.9 kg 68.9 kg 67.5 kg      Telemetry    Normal sinus rhythm- Personally Reviewed  ECG     - Personally Reviewed  Physical Exam   GEN: No acute distress.   Neck: No JVD Cardiac: RRR, no murmurs, rubs, or gallops.  Respiratory: Clear to auscultation bilaterally. GI: Soft, nontender, non-distended  MS: No edema; No deformity. Neuro:  Nonfocal  Psych: Normal affect   Labs    High Sensitivity Troponin:   Recent Labs  Lab 05/09/20 0031 05/09/20 0242 05/19/20 0709  TROPONINIHS 13 19* 14       Chemistry Recent Labs  Lab 05/19/20 0709 05/20/20 0608 05/22/20 0440 05/23/20 0435 05/24/20 0432  NA 138   < > 135 140 138  K 4.1   < > 3.2* 4.7 4.5  CL 102   < > 98 104 102  CO2 25   < > 27 26 27   GLUCOSE 131*   < > 108* 102* 106*  BUN 11   < > 33* 26* 20  CREATININE 1.18*   < > 1.75* 1.57* 1.52*  CALCIUM 9.1   < > 8.7* 9.0 8.8*  PROT 6.8  --   --   --   --   ALBUMIN 3.3*  --   --   --   --   AST 22  --   --   --   --   ALT 9  --   --   --   --   ALKPHOS 81  --   --   --   --   BILITOT 1.1  --   --   --   --   GFRNONAA 50*   < > 31* 35* 37*  ANIONGAP 11   < > 10 10 9    < > =  values in this interval not displayed.     Hematology Recent Labs  Lab 05/19/20 0709 05/20/20 0608  WBC 10.9* 7.9  RBC 3.73* 3.69*  HGB 10.7* 10.5*  HCT 33.3* 31.4*  MCV 89.3 85.1  MCH 28.7 28.5  MCHC 32.1 33.4  RDW 15.4 15.3  PLT 291 240    BNP Recent Labs  Lab 05/19/20 0709 05/23/20 0855  BNP 1,545.5* 500.2*     DDimer No results for input(s): DDIMER in the last 168 hours.   Radiology    No results found.  Cardiac Studies   Echocardiogram 1. Left ventricular ejection fraction, by estimation, is <20%. The left  ventricle has severely decreased function. The left ventricle demonstrates  global hypokinesis. The left ventricular internal cavity size was mildly  dilated. Left ventricular  diastolic parameters were normal.  2. Right ventricular systolic function is normal. The right ventricular  size is normal.  3. Left atrial size was mildly dilated.  4. The mitral valve was not well visualized. Mild mitral valve  regurgitation.  5. The aortic valve was not well visualized. Aortic valve regurgitation  is mild to moderate. Mild to moderate aortic valve sclerosis/calcification  is present, without any evidence of aortic stenosis.   Patient Profile     69 y.o.femalewith history of CAD/PCI DES to P LAD, nonischemic cardiomyopathy, EF less than 20% secondary to  chemo, CKD stage III, PE on Eliquis presenting with volume overload.  Assessment & Plan    A/P:  Nonischemic cardiomyopathy  Acute on chronic systolic and diastolic CHF  Drop in ejection fraction after recent chemotherapy Carvedilol and losartan held secondary to hypotension Tolerating Jardiance, Corlinor,  Okay to restart Lasix 20 daily is taking as outpatient Close outpatient follow-up We will look to restart low-dose carvedilol and losartan in the office   Endometrial cancer  Scheduled to have CT scan as outpatient  per oncology She attributes her cardiomyopathy to her chemo medication which she has had 3 times.  Reports ending up in the hospital after each treatment   Paroxysmal tachycardia  SVT on telemetry on arrival  Received adenosine by EMTs Still off beta-blocker secondary to hypotension   Total encounter time more than 25 minutes  Greater than 50% was spent in counseling and coordination of care with the patient   For questions or updates, please contact [place] Please consult [email or web address] for contact info under        Signed, Ida Rogue, MD  05/24/2020, 2:10 PM

## 2020-05-24 NOTE — Care Management Important Message (Signed)
Important Message  Patient Details  Name: Jaime Hall MRN: [id number] Date of Birth: [birth year]   Medicare Important Message Given:  Yes     Dannette Barbara 05/24/2020, 1:43 PM

## 2020-05-24 NOTE — Discharge Summary (Addendum)
Jaime Hall IHK:[id number] DOB: 1951/02/01 DOA: 05/19/2020  PCP: [name], MD  Admit date: 05/19/2020 Discharge date: 05/24/2020  Admitted From: home Disposition:  home  Recommendations for Outpatient Follow-up:  1. Follow up with PCP in 1 week 2. Please obtain BMP/CBC in one week 3. Follow-up with CHF clinic in 1 week 4. Follow-up with Dr. [name] cardiology in 1 week 5. Follow-up with oncology Dr. Janese Banks in 1 to 2 weeks     Discharge Condition:Stable CODE STATUS: DNR Diet recommendation: Heart Healthy  Brief/Interim Summary: Per VFI:EPPIRJJ Jaime Hall is a 69 y.o. 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  Acute on chronic combined systolic and diastolic CHF (congestive heart failure) [facility]): --Patient had 1+ leg edema, positive JVD,elevated BNP 1545, chest x-ray showed bilateral pulmonary edema,clinically consistent with CHF exacerbation. -- Patient recently has notbeen taking her Lasix on a daily basis in order to preserve renal function as suggested by her oncologist given the need chemotherapy she has been getting. She watches her salt diet. She is admitted to the hospital service, cardiology was consulted She was placed on IV Lasix until she was more euvolemic.  Then it was discontinued. Her Coreg was also discontinued due to soft blood pressures. Cardiology added Empagliflozin and Corlin Will need to f/u with cards for initiation of beta blk and ARBS as outpt. She has 2L [place] she wears at night/prn, pt was walked and requires 2L [place] with ambulation. I did discuss this with her and instructed to wear 2L on ambulation  and she verbalizes an understanding. -cardiology on discharge recommended to continue lasix home dose.    Coronary artery disease/PCI to LAD  Continue statin   Recurrent Endometrial cancer [facility]):With peritoneal carcinomatosis. -Patient was started Lenvima on 11/1, but the patient is currently not taking this medication. Needs to follow-up with oncology Dr. Janese Banks.  She got Keytruda few days ago   Hypokalemia Supplemented. Stable.    Normocytic anemia: Hemoglobin remained stable, continue to monitor periodically  Leukocytosisand recent OAC:[id number].  WBC 7.9.  Ptison Keflex for UTI since 05/09/20. Nosymptoms of UTI today Finished prescription -UC from 11/25 --multiple species  HTN (hypertension) BP on low side. Hold beta blk and ARBS  HLD (hyperlipidemia) -on Lipitor  CKD (chronic kidney disease), stage IIIa:stable   Depression with anxiety -Amitriptyline   Hypothyroidism -on Synthroid  PE (pulmonary thromboembolism) (HCC) -Continue Eliquis  GERD (gastroesophageal reflux disease) -Protonix   Discharge Diagnoses:  Principal Problem:   Acute on chronic combined systolic and diastolic CHF (congestive heart failure) (HCC) Active Problems:   Coronary artery disease   Endometrial cancer (HCC)   Normocytic anemia   Leukocytosis   HTN (hypertension)   HLD (hyperlipidemia)   CKD (chronic kidney disease), stage IIIa   Depression with anxiety   Hypothyroidism   PE (pulmonary thromboembolism) (HCC)   GERD (gastroesophageal reflux disease)   UTI (urinary tract infection)    Discharge Instructions  Discharge Instructions    Call MD for:  difficulty breathing, headache or visual disturbances   Complete by: As directed    Diet - low sodium heart healthy   Complete by: As directed    Increase activity slowly   Complete by: As directed  Allergies as of 05/24/2020      Reactions   Adhesive [tape] Other (See Comments)   Skin  Irritation   Antifungal [miconazole Nitrate] Rash   Sulfa Antibiotics Nausea And Vomiting, Rash   Z-pak [azithromycin] Itching      Medication List    STOP taking these medications   carvedilol 3.125 MG tablet Commonly known as: Coreg   cephALEXin 500 MG capsule Commonly known as: KEFLEX   losartan 25 MG tablet Commonly known as: COZAAR     TAKE these medications   amitriptyline 25 MG tablet Commonly known as: ELAVIL Take 1 tablet (25 mg total) by mouth at bedtime. May increase weekly as needed   apixaban 2.5 MG Tabs tablet Commonly known as: Eliquis Take 1 tablet (2.5 mg total) by mouth 2 (two) times daily.   atorvastatin 10 MG tablet Commonly known as: LIPITOR Take 1 tablet by mouth once daily What changed: when to take this   cholecalciferol 25 MCG (1000 UNIT) tablet Commonly known as: VITAMIN D3 Take 1,000 Units by mouth in the morning and at bedtime.   cyanocobalamin 1000 MCG/ML injection Commonly known as: (VITAMIN B-12) INJECT 1ML IM WEEKLY FOR 4 WEEKS, THEN INJECT MONTHLY THEREAFTER What changed:   how much to take  how to take this  when to take this  additional instructions   empagliflozin 10 MG Tabs tablet Commonly known as: JARDIANCE Take 1 tablet (10 mg total) by mouth daily. Start taking on: May 25, 2020   furosemide 20 MG tablet Commonly known as: LASIX Take 1 tablet (20 mg total) by mouth daily. What changed:   when to take this  reasons to take this   ivabradine 5 MG Tabs tablet Commonly known as: CORLANOR Take 1 tablet (5 mg total) by mouth 2 (two) times daily with a meal.   Lenvima (8 MG Daily Dose) 2 x 4 MG capsule Generic drug: lenvatinib 8 mg daily dose Take 2 capsules (8 mg total) by mouth daily.   levothyroxine 75 MCG tablet Commonly known as: Synthroid Take 1 tablet (75 mcg total) by mouth daily before breakfast.   LORazepam 0.5 MG tablet Commonly known as: ATIVAN Take 1 tablet (0.5 mg total) by mouth every 6  (six) hours as needed for anxiety (and to help with breathing).   morphine CONCENTRATE 10 mg / 0.5 ml concentrated solution Take 0.25 mLs (5 mg total) by mouth every 6 (six) hours as needed for severe pain or shortness of breath.   pantoprazole 20 MG tablet Commonly known as: Protonix Take 1 tablet (20 mg total) by mouth daily.   potassium chloride 10 MEQ tablet Commonly known as: KLOR-CON Take 1 tablet (10 mEq total) by mouth daily.       Follow-up Information    [name], MD On 06/03/2020.   Specialty: Internal Medicine Why: @ 2:30pm Contact information: [address] [place] [address] [phone]        Wellington Hampshire, MD On 05/31/2020.   Specialty: Cardiology Why: f/u with chf hospitalized...@ 10am Contact information: [address] [place] [address] [phone]        [place] On 05/27/2020.   Specialty: Cardiology Why: @ 3pm Contact information: [address] [place] [phone]       Sindy Guadeloupe, MD On 05/28/2020.   Specialty: Oncology Why: @10 :45am Contact information: [place] [address]  [phone]              Allergies  Allergen Reactions  . Adhesive [Tape] Other (See Comments)    Skin Irritation   . Antifungal [Miconazole Nitrate] Rash  . Sulfa Antibiotics Nausea And Vomiting and Rash  . Z-Pak [Azithromycin] Itching    Consultations:  cardiology   Procedures/Studies: DG Chest Portable 1 View  Result Date: 05/19/2020 CLINICAL DATA:  Shortness of breath, chest heaviness. EXAM: PORTABLE CHEST 1 VIEW COMPARISON:  Chest x-rays dated 02/19/2020 and 02/13/2020. FINDINGS: Heart size and mediastinal contours are stable. RIGHT chest wall Port-A-Cath is stable in position. The hazy opacities within each lung are stable compared to the most recent chest x-ray of 02/19/2020,  but new compared to the earlier study of 02/13/2020. No new lung findings. No pleural effusion or pneumothorax is seen. Osseous structures about the chest are unremarkable. IMPRESSION: 1. Persistent hazy opacities within each lung, stable compared to the most recent chest x-ray of 02/19/2020, but new compared to chest x-ray of 02/13/2020. Differential includes persistent/recurrent pulmonary edema, interstitial pneumonia and hypersensitivity pneumonitis. 2. No new lung findings. Electronically Signed   By: Franki Cabot M.D.   On: 05/19/2020 08:32   ECHOCARDIOGRAM COMPLETE  Result Date: 05/20/2020    ECHOCARDIOGRAM REPORT   Patient Name:   GEORJEAN TOYA Date of Exam: 05/20/2020 Medical Rec #:  [id number]         Height:       61.7 in Accession #:    [id number]        Weight:       148.4 lb Date of Birth:  07/25/1950         BSA:          1.679 m Patient Age:    89 years          BP:           98/54 mmHg Patient Gender: F                 HR:           59 bpm. Exam Location:  [facility] Procedure: 2D Echo, Cardiac Doppler and Color Doppler Indications:     CHF- acute systolic 388.82  History:         Patient has prior history of Echocardiogram examinations, most                  recent 12/29/2019. CHF, Signs/Symptoms:Murmur; Risk                  Factors:Hypertension.  Sonographer:     Sherrie Sport RDCS (AE) Referring Phys:  [id number] Soledad Gerlach NIU Diagnosing Phys: Bartholome Bill MD  Sonographer Comments: Global longitudinal strain was attempted. IMPRESSIONS  1. Left ventricular ejection fraction, by estimation, is <20%. The left ventricle has severely decreased function. The left ventricle demonstrates global hypokinesis. The left ventricular internal cavity size was mildly dilated. Left ventricular diastolic parameters were normal.  2. Right ventricular systolic function is normal. The right ventricular size is normal.  3. Left atrial size was mildly dilated.  4. The mitral valve was not well visualized. Mild mitral valve  regurgitation.  5. The aortic valve was not well visualized. Aortic valve regurgitation is mild to moderate. Mild to moderate aortic valve sclerosis/calcification is present, without any evidence of aortic stenosis. FINDINGS  Left Ventricle: Left ventricular ejection fraction, by estimation, is <20%. The left ventricle has severely decreased function. The left ventricle demonstrates global hypokinesis.  The left ventricular internal cavity size was mildly dilated. There is no  left ventricular hypertrophy. Left ventricular diastolic parameters were normal. Right Ventricle: The right ventricular size is normal. No increase in right ventricular wall thickness. Right ventricular systolic function is normal. Left Atrium: Left atrial size was mildly dilated. Right Atrium: Right atrial size was normal in size. Pericardium: There is no evidence of pericardial effusion. Mitral Valve: The mitral valve was not well visualized. Mild mitral valve regurgitation. Tricuspid Valve: The tricuspid valve is not well visualized. Tricuspid valve regurgitation is mild. Aortic Valve: The aortic valve was not well visualized. Aortic valve regurgitation is mild to moderate. Aortic regurgitation PHT measures 562 msec. Mild to moderate aortic valve sclerosis/calcification is present, without any evidence of aortic stenosis.  Aortic valve mean gradient measures 4.7 mmHg. Aortic valve peak gradient measures 8.1 mmHg. Aortic valve area, by VTI measures 1.44 cm. Pulmonic Valve: The pulmonic valve was not well visualized. Pulmonic valve regurgitation is trivial. Aorta: The aortic root is normal in size and structure. IAS/Shunts: The interatrial septum was not assessed.  LEFT VENTRICLE PLAX 2D LVIDd:         5.22 cm      Diastology LVIDs:         4.98 cm      LV e' medial:    3.92 cm/Jaime LV PW:         1.16 cm      LV E/e' medial:  19.9 LV IVS:        0.88 cm      LV e' lateral:   5.33 cm/Jaime LVOT diam:     2.00 cm      LV E/e' lateral: 14.7 LV SV:          33 LV SV Index:   20 LVOT Area:     3.14 cm  LV Volumes (MOD) LV vol d, MOD A2C: 110.0 ml LV vol d, MOD A4C: 91.7 ml LV vol Jaime, MOD A2C: 68.9 ml LV vol Jaime, MOD A4C: 74.8 ml LV SV MOD A2C:     41.1 ml LV SV MOD A4C:     91.7 ml LV SV MOD BP:      30.1 ml RIGHT VENTRICLE RV Basal diam:  2.25 cm RV Jaime prime:     13.10 cm/Jaime TAPSE (M-mode): 2.9 cm LEFT ATRIUM             Index       RIGHT ATRIUM           Index LA diam:        3.80 cm 2.26 cm/m  RA Area:     10.70 cm LA Vol (A2C):   66.0 ml 39.32 ml/m RA Volume:   22.80 ml  13.58 ml/m LA Vol (A4C):   48.2 ml 28.72 ml/m LA Biplane Vol: 57.9 ml 34.49 ml/m  AORTIC VALVE                   PULMONIC VALVE AV Area (Vmax):    1.27 cm    PV Vmax:        0.74 m/Jaime AV Area (Vmean):   1.09 cm    PV Peak grad:   2.2 mmHg AV Area (VTI):     1.44 cm    RVOT Peak grad: 2 mmHg AV Vmax:           142.33 cm/Jaime AV Vmean:          96.833 cm/Jaime AV  VTI:            0.231 m AV Peak Grad:      8.1 mmHg AV Mean Grad:      4.7 mmHg LVOT Vmax:         57.60 cm/Jaime LVOT Vmean:        33.700 cm/Jaime LVOT VTI:          0.106 m LVOT/AV VTI ratio: 0.46 AI PHT:            562 msec  AORTA Ao Root diam: 2.70 cm MITRAL VALVE               TRICUSPID VALVE MV Area (PHT): 4.31 cm    TR Peak grad:   17.3 mmHg MV Decel Time: 176 msec    TR Vmax:        208.00 cm/Jaime MV E velocity: 78.10 cm/Jaime MV A velocity: 80.50 cm/Jaime  SHUNTS MV E/A ratio:  0.97        Systemic VTI:  0.11 m                            Systemic Diam: 2.00 cm Bartholome Bill MD Electronically signed by Bartholome Bill MD Signature Date/Time: 05/20/2020/11:59:25 AM    Final       Subjective: Feels great. No sob or dizziness. No sx with ambulation  Discharge Exam: Vitals:   05/24/20 0828 05/24/20 1155  BP: (!) 95/42 119/62  Pulse: 74 77  Resp: 20 18  Temp: 98 F (36.7 C) 97.6 F (36.4 C)  SpO2: 100% 99%   Vitals:   05/24/20 0400 05/24/20 0435 05/24/20 0828 05/24/20 1155  BP:  (!) 97/46 (!) 95/42 119/62  Pulse:  76 74 77  Resp:   20 18   Temp:  97.9 F (36.6 C) 98 F (36.7 C) 97.6 F (36.4 C)  TempSrc:  Oral Oral Oral  SpO2:  97% 100% 99%  Weight: 68.9 kg     Height:        General: Pt is alert, awake, not in acute distress Cardiovascular: RRR, S1/S2 +, no rubs, no gallops Respiratory: CTA bilaterally, no wheezing, no rhonchi Abdominal: Soft, NT, ND, bowel sounds + Extremities: no edema, no cyanosis    The results of significant diagnostics from this hospitalization (including imaging, microbiology, ancillary and laboratory) are listed below for reference.     Microbiology: Recent Results (from the past 240 hour(Jaime))  Resp Panel by RT-PCR (Flu A&B, Covid) Nasopharyngeal Swab     Status: None   Collection Time: 05/19/20  7:41 AM   Specimen: Nasopharyngeal Swab; Nasopharyngeal(NP) swabs in vial transport medium  Result Value Ref Range Status   SARS Coronavirus 2 by RT PCR NEGATIVE NEGATIVE Final    Comment: (NOTE) SARS-CoV-2 target nucleic acids are NOT DETECTED.  The SARS-CoV-2 RNA is generally detectable in upper respiratory specimens during the acute phase of infection. The lowest concentration of SARS-CoV-2 viral copies this assay can detect is 138 copies/mL. A negative result does not preclude SARS-Cov-2 infection and should not be used as the sole basis for treatment or other patient management decisions. A negative result may occur with  improper specimen collection/handling, submission of specimen other than nasopharyngeal swab, presence of viral mutation(Jaime) within the areas targeted by this assay, and inadequate number of viral copies(<138 copies/mL). A negative result must be combined with clinical observations, patient history, and epidemiological information. The expected result is Negative.  Fact Sheet for Patients:  EntrepreneurPulse.com.au  Fact Sheet for Healthcare Providers:  IncredibleEmployment.be  This test is no t yet approved or cleared by the  [place] FDA and  has been authorized for detection and/or diagnosis of SARS-CoV-2 by FDA under an Emergency Use Authorization (EUA). This EUA will remain  in effect (meaning this test can be used) for the duration of the COVID-19 declaration under Section 564(b)(1) of the Act, 21 U.Jaime.C.section 360bbb-3(b)(1), unless the authorization is terminated  or revoked sooner.       Influenza A by PCR NEGATIVE NEGATIVE Final   Influenza B by PCR NEGATIVE NEGATIVE Final    Comment: (NOTE) The Xpert Xpress SARS-CoV-2/FLU/RSV plus assay is intended as an aid in the diagnosis of influenza from Nasopharyngeal swab specimens and should not be used as a sole basis for treatment. Nasal washings and aspirates are unacceptable for Xpert Xpress SARS-CoV-2/FLU/RSV testing.  Fact Sheet for Patients: EntrepreneurPulse.com.au  Fact Sheet for Healthcare Providers: IncredibleEmployment.be  This test is not yet approved or cleared by the [place] FDA and has been authorized for detection and/or diagnosis of SARS-CoV-2 by FDA under an Emergency Use Authorization (EUA). This EUA will remain in effect (meaning this test can be used) for the duration of the COVID-19 declaration under Section 564(b)(1) of the Act, 21 U.Jaime.C. section 360bbb-3(b)(1), unless the authorization is terminated or revoked.  Performed at [facility], [address] [place], [place] [address]      Labs: BNP (last 3 results) Recent Labs    02/20/20 0411 05/19/20 0709 05/23/20 0855  BNP 3,068.3* 1,545.5* 379.0*   Basic Metabolic Panel: Recent Labs  Lab 05/20/20 0608 05/21/20 0630 05/22/20 0440 05/23/20 0435 05/24/20 0432  NA 137 136 135 140 138  K 3.6 3.4* 3.2* 4.7 4.5  CL 97* 95* 98 104 102  CO2 29 29 27 26 27   GLUCOSE 109* 116* 108* 102* 106*  BUN 20 32* 33* 26* 20  CREATININE 1.52* 1.91* 1.75* 1.57* 1.52*  CALCIUM 8.9 8.7* 8.7* 9.0 8.8*  MG 1.7 1.8 1.8 1.9  2.0   Liver Function Tests: Recent Labs  Lab 05/19/20 0709  AST 22  ALT 9  ALKPHOS 81  BILITOT 1.1  PROT 6.8  ALBUMIN 3.3*   No results for input(Jaime): LIPASE, AMYLASE in the last 168 hours. No results for input(Jaime): AMMONIA in the last 168 hours. CBC: Recent Labs  Lab 05/19/20 0709 05/20/20 0608  WBC 10.9* 7.9  HGB 10.7* 10.5*  HCT 33.3* 31.4*  MCV 89.3 85.1  PLT 291 240   Cardiac Enzymes: No results for input(Jaime): CKTOTAL, CKMB, CKMBINDEX, TROPONINI in the last 168 hours. BNP: Invalid input(Jaime): POCBNP CBG: No results for input(Jaime): GLUCAP in the last 168 hours. D-Dimer No results for input(Jaime): DDIMER in the last 72 hours. Hgb A1c No results for input(Jaime): HGBA1C in the last 72 hours. Lipid Profile No results for input(Jaime): CHOL, HDL, LDLCALC, TRIG, CHOLHDL, LDLDIRECT in the last 72 hours. Thyroid function studies No results for input(Jaime): TSH, T4TOTAL, T3FREE, THYROIDAB in the last 72 hours.  Invalid input(Jaime): FREET3 Anemia work up No results for input(Jaime): VITAMINB12, FOLATE, FERRITIN, TIBC, IRON, RETICCTPCT in the last 72 hours. Urinalysis    Component Value Date/Time   COLORURINE YELLOW (A) 05/09/2020 0031   APPEARANCEUR CLOUDY (A) 05/09/2020 0031   APPEARANCEUR Clear 09/24/2016 0945   LABSPEC 1.016 05/09/2020 0031   PHURINE 5.0 05/09/2020 0031   GLUCOSEU NEGATIVE 05/09/2020 0031   GLUCOSEU NEGATIVE 07/07/2016  [place] (A) 05/09/2020 0031   BILIRUBINUR NEGATIVE 05/09/2020 0031   BILIRUBINUR negative 10/02/2019 1627   BILIRUBINUR Negative 09/24/2016 0945   KETONESUR NEGATIVE 05/09/2020 0031   PROTEINUR 30 (A) 05/09/2020 0031   UROBILINOGEN 0.2 10/02/2019 1627   UROBILINOGEN 0.2 07/07/2016 1418   NITRITE NEGATIVE 05/09/2020 0031   LEUKOCYTESUR LARGE (A) 05/09/2020 0031   Sepsis Labs Invalid input(Jaime): PROCALCITONIN,  WBC,  LACTICIDVEN Microbiology Recent Results (from the past 240 hour(Jaime))  Resp Panel by RT-PCR (Flu A&B, Covid) Nasopharyngeal  Swab     Status: None   Collection Time: 05/19/20  7:41 AM   Specimen: Nasopharyngeal Swab; Nasopharyngeal(NP) swabs in vial transport medium  Result Value Ref Range Status   SARS Coronavirus 2 by RT PCR NEGATIVE NEGATIVE Final    Comment: (NOTE) SARS-CoV-2 target nucleic acids are NOT DETECTED.  The SARS-CoV-2 RNA is generally detectable in upper respiratory specimens during the acute phase of infection. The lowest concentration of SARS-CoV-2 viral copies this assay can detect is 138 copies/mL. A negative result does not preclude SARS-Cov-2 infection and should not be used as the sole basis for treatment or other patient management decisions. A negative result may occur with  improper specimen collection/handling, submission of specimen other than nasopharyngeal swab, presence of viral mutation(Jaime) within the areas targeted by this assay, and inadequate number of viral copies(<138 copies/mL). A negative result must be combined with clinical observations, patient history, and epidemiological information. The expected result is Negative.  Fact Sheet for Patients:  EntrepreneurPulse.com.au  Fact Sheet for Healthcare Providers:  IncredibleEmployment.be  This test is no t yet approved or cleared by the [place] FDA and  has been authorized for detection and/or diagnosis of SARS-CoV-2 by FDA under an Emergency Use Authorization (EUA). This EUA will remain  in effect (meaning this test can be used) for the duration of the COVID-19 declaration under Section 564(b)(1) of the Act, 21 U.Jaime.C.section 360bbb-3(b)(1), unless the authorization is terminated  or revoked sooner.       Influenza A by PCR NEGATIVE NEGATIVE Final   Influenza B by PCR NEGATIVE NEGATIVE Final    Comment: (NOTE) The Xpert Xpress SARS-CoV-2/FLU/RSV plus assay is intended as an aid in the diagnosis of influenza from Nasopharyngeal swab specimens and should not be used as a sole  basis for treatment. Nasal washings and aspirates are unacceptable for Xpert Xpress SARS-CoV-2/FLU/RSV testing.  Fact Sheet for Patients: EntrepreneurPulse.com.au  Fact Sheet for Healthcare Providers: IncredibleEmployment.be  This test is not yet approved or cleared by the [place] FDA and has been authorized for detection and/or diagnosis of SARS-CoV-2 by FDA under an Emergency Use Authorization (EUA). This EUA will remain in effect (meaning this test can be used) for the duration of the COVID-19 declaration under Section 564(b)(1) of the Act, 21 U.Jaime.C. section 360bbb-3(b)(1), unless the authorization is terminated or revoked.  Performed at [facility], [address], [place], [place] [address]      Time coordinating discharge: Over 30 minutes  SIGNED:   Nolberto Hanlon, MD  [facility] 05/24/2020, 2:51 PM Pager   If 7PM-7AM, please contact night-coverage

## 2020-05-24 NOTE — Telephone Encounter (Signed)
DPR on file lmom. Jardiance was not prescdibed by Dr. [name]  And was prescribed by the hospitalist at the pt  05/23/20 hospital discharge.  I will fwd the msg to Dr. [name] to give his recommendation.

## 2020-05-24 NOTE — Progress Notes (Signed)
Mobility Specialist - Progress Note   05/24/20 1200  Mobility  Activity Ambulated in hall;Ambulated to bathroom  Level of Assistance Independent  Assistive Device None  Distance Ambulated (ft) 380 ft  Mobility Response Tolerated well  Mobility performed by Mobility specialist  $Mobility charge 1 Mobility    Pre-mobility: 74 HR, 97% SpO2 During mobility: 83 HR, 79% SpO2 Post-mobility: 81 HR, 97% SpO2   Pt was lying in bed upon arrival utilizing room air. Pt agreed to session. Pt denied any pain, nausea, or fatigue. Pt is independent in all transfers, including ambulation. Prior to activity, pt ambulated to restroom where she is able to provide self peri-care. Pt continued ambulation into hallway. No AD used for session. After ~150', pt's O2 desat to 79%. Pt denied SOB. No heavy breathing noted with great conversation throughout. Pt denied dizziness or fatigue. No s/s of distress. PLB technique was utilized and O2 sat up to high 90s quickly. Overall, pt tolerated session well. Pt was left EOB with all needs in reach.   Kathee Delton Mobility Specialist 05/24/20, 12:16 PM

## 2020-05-24 NOTE — Progress Notes (Signed)
Patient discharged at this time via wheelchair escorted by charge nurse. PIV/Tele removed and discharge instructions verbalized by patient. Patient left with all belongings.

## 2020-05-24 NOTE — Telephone Encounter (Signed)
   Patient was discharged from [facility] today following a hospitalization for acute on chronic combined CHF. She was discharged home on ivabradine, in addition to [place]. Unfortunately when she went to pick up her medications the cost for both was quite high. She was able to get Jardiance approved through her PCP, however ivabradine was $600 for a 1 month supply. I called [place] and was told the medication required a prior authorization.    Prior auth contact info: [phone] with alternative number [phone] ID number: [id number]  Will route to the [facility] office to assist with medication coverage. Patient will monitor blood pressures and volume status closely over the weekend and call if any issues arise.   Abigail Butts, PA-C 05/24/20; 7:58 PM

## 2020-05-25 NOTE — Progress Notes (Deleted)
Patient ID: Jaime Hall, female    DOB: 04/21/1951, 69 y.o.   MRN: [id number]  HPI  Jaime Hall is a 69 yr old female with a history of CAD, HTN, CKD, polycystic ovarian disease, anxiety, GERD, Hodgkin's lymphoma, endometrial cancer, previous tobacco use and chronic heart failure.   Echo report from 05/20/20 reviewed and showed an EF of <20% along with mild MR. Echo report from 12/29/19 reviewed and showed an EF of 30-35% along with mild MR.   Catheterization done 12/04/19 showed:  There is moderate to severe left ventricular systolic dysfunction.  LV end diastolic pressure is moderately elevated.  Previously placed Prox LAD stent (unknown type) is widely patent.  Ost LAD to Prox LAD lesion is 40% stenosed.   1.  Patent LAD stent with no significant restenosis.  There is moderate stenosis in ostial LAD before the stent.  No evidence of obstructive disease overall. 2.  Moderately to severely reduced LV systolic function with an EF of 30 to 35%. 3.  Right heart catheterization showed mildly elevated filling pressures with pulmonary capillary wedge pressure of 18 mmHg, mild to moderate pulmonary hypertension at 44/19 with a mean of 33 mmHg, and mildly reduced cardiac output at 3.89 with a cardiac index of 2.24.  Pulmonary vascular resistance is 3.8 [place] units  Admitted 05/19/20 due to acute on chronic HF. Cardiology consult obtained. Initially given IV lasix with transition to oral diuretics. Carvedilol stopped due to soft BP. Needs oxygen upon ambulation. Discharged after 5 days. Was in the ED 05/09/20 due to chest pressure and palpitations along with mild shortness of breath. NS and adenosine given for SVT with HR of 180. HR decreased to 110. IV magnesium given for hypomagnesium. IV antibiotic given for UTI. HR continued to decrease and she was released. Admitted twice September 2021. Admitted 01/26/20 due to fast heart beat and rapid breathing at hospital in [facility]. Per patient, echo, CT and labs  done and she was released. Admitted 12/29/19 due to chest pain that woke her from her sleep. Cardiology consult obtained and chest pain had resolved. Elevated troponin thought to be due to demand ischemia or transient coronary vasospasm. Discharged the following day.   She presents today for a follow-up visit with a chief complaint of   Past Medical History:  Diagnosis Date  . Anxiety   . Cervicalgia   . CHF (congestive heart failure) ([place])   . Chronic kidney disease   . Coronary artery disease    a. 02/2012 Stress echo: severe anterior wall ischemia;  b. 02/2012 Cath/PCI: LAD 95p (3.0 x 15 Xience EX DES), D1 90ost (PTCA - bifurcational dzs), EF 45% with anterior HK;  b. 02/2013 Ex MV: fixed anterior defect w/ minor reversibility, nl EF-->Med Rx.  . Endometrial cancer ([place])    a. 07/2016 s/p robotic hysterectomy, BSO w/ washings, sentinel node inj, mapping, bx, adhesiolysis.  . Essential hypertension, benign   . Fibrocystic breast disease   . GERD (gastroesophageal reflux disease)   . Gestational hypertension   . Heart murmur   . History of anemia   . History of blood transfusion   . Hodgkin's lymphoma ([place]) 2011   a. s/p radiation and chemo therapy  . Osteoarthritis   . Polycystic ovarian disease    Past Surgical History:  Procedure Laterality Date  . ABDOMINAL HYSTERECTOMY    . bladder sling    . CARDIAC CATHETERIZATION  02/2012   [facility] 1 stent place  . CERVICAL POLYPECTOMY    .  CHOLECYSTECTOMY  1982  . COLONOSCOPY WITH PROPOFOL N/A 02/05/2015   Procedure: COLONOSCOPY WITH PROPOFOL;  Surgeon: Lucilla Lame, MD;  Location: [facility] ENDOSCOPY;  Service: Endoscopy;  Laterality: N/A;  . CORONARY ANGIOPLASTY  02/2012   left/right s/p balloon  . CYSTOGRAM N/A 08/17/2016   Procedure: CYSTOGRAM;  Surgeon: Hollice Espy, MD;  Location: [facility] ORS;  Service: Urology;  Laterality: N/A;  . CYSTOSCOPY N/A 08/17/2016   Procedure: CYSTOSCOPY EXAM UNDER ANESTHESIA;  Surgeon: Hollice Espy, MD;  Location:  [facility] ORS;  Service: Urology;  Laterality: N/A;  . CYSTOSCOPY W/ RETROGRADES Bilateral 08/17/2016   Procedure: CYSTOSCOPY WITH RETROGRADE PYELOGRAM;  Surgeon: Hollice Espy, MD;  Location: [facility] ORS;  Service: Urology;  Laterality: Bilateral;  . CYSTOSCOPY WITH STENT PLACEMENT Right 08/17/2016   Procedure: CYSTOSCOPY WITH STENT PLACEMENT;  Surgeon: Hollice Espy, MD;  Location: [facility] ORS;  Service: Urology;  Laterality: Right;  . heart stent'  2013  . kidney stent Right 2018  . LYMPH NODE BIOPSY  2011   diagnosis of hodgkins lymphoma  . PELVIC LYMPH NODE DISSECTION N/A 07/29/2016   Procedure: PELVIC/AORTIC LYMPH NODE SAMPLING;  Surgeon: [name], MD;  Location: [facility] ORS;  Service: Gynecology;  Laterality: N/A;  . PORTA CATH INSERTION N/A 09/22/2016   Procedure: Glori Luis Cath Insertion;  Surgeon: [name], MD;  Location: [place] CV LAB;  Service: Cardiovascular;  Laterality: N/A;  . PORTA CATH INSERTION N/A 10/27/2019   Procedure: PORTA CATH INSERTION;  Surgeon: [name], MD;  Location: [place] [facility];  Service: Cardiovascular;  Laterality: N/A;  . PORTA CATH REMOVAL N/A 11/17/2016   Procedure: Glori Luis Cath Removal;  Surgeon: [name], MD;  Location: [place] [facility];  Service: Cardiovascular;  Laterality: N/A;  . RIGHT/LEFT HEART CATH AND CORONARY ANGIOGRAPHY N/A 12/04/2019   Procedure: RIGHT/LEFT HEART CATH AND CORONARY ANGIOGRAPHY;  Surgeon: Wellington Hampshire, MD;  Location: [place] [facility];  Service: Cardiovascular;  Laterality: N/A;  . ROBOTIC ASSISTED TOTAL HYSTERECTOMY WITH BILATERAL SALPINGO OOPHERECTOMY N/A 07/29/2016   Procedure: ROBOTIC ASSISTED TOTAL HYSTERECTOMY WITH BILATERAL SALPINGO OOPHORECTOMY;  Surgeon: [name], MD;  Location: [facility] ORS;  Service: Gynecology;  Laterality: N/A;  . SENTINEL NODE BIOPSY N/A 07/29/2016   Procedure: SENTINEL NODE BIOPSY;  Surgeon: [name], MD;  Location: [facility] ORS;  Service:  Gynecology;  Laterality: N/A;  . transobturator sling N/A 2009   [place]   Family History  Problem Relation Age of Onset  . ALS Father   . Polymyositis Father   . Diabetes Brother   . Cancer Maternal Aunt        breast  . Breast cancer Maternal Aunt        30's  . Stroke Maternal Grandmother   . Cancer Maternal Grandfather        prostate  . Stroke Maternal Grandfather   . Colon cancer Maternal Aunt   . Non-Hodgkin's lymphoma Cousin    Social History   Tobacco Use  . Smoking status: Former Smoker    Packs/day: 1.00    Years: 30.00    Pack years: 30.00    Types: Cigarettes    Quit date: 07/24/2002    Years since quitting: 17.8  . Smokeless tobacco: Never Used  . Tobacco comment: quit smoking in 2000  Substance Use Topics  . Alcohol use: Not Currently   Allergies  Allergen Reactions  . Adhesive [Tape] Other (See Comments)    Skin Irritation   .  Antifungal [Miconazole Nitrate] Rash  . Sulfa Antibiotics Nausea And Vomiting and Rash  . Z-Pak [Azithromycin] Itching     Review of Systems  Constitutional: Positive for fatigue (improving). Negative for appetite change.  HENT: Negative for congestion, postnasal drip and sore throat.   Eyes: Negative.   Respiratory: Positive for cough (when laying down or when short of breath) and shortness of breath (better). Negative for chest tightness.   Cardiovascular: Negative for chest pain, palpitations and leg swelling.  Gastrointestinal: Positive for abdominal pain (right sided). Negative for abdominal distention.  Endocrine: Negative.   Genitourinary: Negative.   Musculoskeletal: Negative for back pain and neck pain.  Skin: Negative.   Allergic/Immunologic: Negative.   Neurological: Negative for dizziness and light-headedness.       Off balance  Hematological: Negative for adenopathy. Does not bruise/bleed easily.  Psychiatric/Behavioral: Positive for sleep disturbance (chronic difficulty; sleeping on 1 pillow). Negative  for dysphoric mood. The patient is not nervous/anxious.     Physical Exam Vitals and nursing note reviewed.  Constitutional:      Appearance: She is well-developed.  HENT:     Head: Normocephalic and atraumatic.  Neck:     Vascular: No JVD.  Cardiovascular:     Rate and Rhythm: Normal rate and regular rhythm.  Pulmonary:     Effort: Pulmonary effort is normal. No respiratory distress.     Breath sounds: No wheezing or rales.  Musculoskeletal:     Cervical back: Neck supple.     Right lower leg: No tenderness. No edema.     Left lower leg: No tenderness. No edema.  Skin:    General: Skin is warm and dry.  Neurological:     General: No focal deficit present.     Mental Status: She is alert and oriented to person, place, and time.  Psychiatric:        Mood and Affect: Mood normal.        Behavior: Behavior normal.     Assessment & Plan:  1: Chronic heart failure with reduced ejection fraction- - NYHA class II - euvolemic today - weighing daily; reminded to call for an overnight weight gain of >2 pounds or a weekly weight gain of >5 pounds - weight 149.4 pounds from last visit 2 weeks ago - using furosemide as needed based on above weight gain, swelling or shortness of breath; says that she hasn't taken it recently - does add "some" salt but says that it's very little; doesn't have much of an appetite anyways - saw cardiology [name]) 04/25/20 - BNP 05/23/20 was 500.2 - reports receiving both COVID vaccines - doubtful BP could tolerate entresto   2: HTN- - BP  - saw PCP Derrel Nip) 04/02/20  3: CKD- 8 BMP 05/24/20 reviewed and showed sodium 137, potassium 4.5, creatinine 1.52 and GFR 37  4: Hodgkin lymphoma with subsequent endometrial cancer- - saw oncologist Janese Banks) 05/17/20 - had video visit with palliative care ([facility]) 04/22/20   Patient did not bring her medications nor a list. Each medication was verbally reviewed with the patient and she was encouraged to bring the  bottles to every visit to confirm accuracy of list.

## 2020-05-27 ENCOUNTER — Telehealth: Payer: Self-pay

## 2020-05-27 ENCOUNTER — Ambulatory Visit: Payer: PPO | Admitting: Family

## 2020-05-27 ENCOUNTER — Encounter: Payer: Self-pay | Admitting: Oncology

## 2020-05-27 ENCOUNTER — Inpatient Hospital Stay: Payer: PPO

## 2020-05-27 NOTE — Telephone Encounter (Signed)
Patient was returning call 

## 2020-05-27 NOTE — Telephone Encounter (Signed)
Patient calling back after speaking with insurance to see if she may be able to take Prazosin HCL as an alternative that is covered by insurance  Please advise

## 2020-05-27 NOTE — Telephone Encounter (Signed)
Routing to Pharm[employer] to see if any idea on patient assistance for ivabradine (as this is something I am not familiar with) or if Prazosin is an appropriate alternative.

## 2020-05-27 NOTE — Telephone Encounter (Signed)
Patient calling back in regarding the cost of this medication  Please advise patient on what is best to do

## 2020-05-27 NOTE — Telephone Encounter (Signed)
Transition Care Management Unsuccessful Follow-up Telephone Call  Date of discharge and from where:  05/24/20 from [facility]  Attempts:  1st Attempt  Reason for unsuccessful TCM follow-up call:  Left voice message. Will follow.

## 2020-05-27 NOTE — Telephone Encounter (Signed)
Will follow.

## 2020-05-27 NOTE — Progress Notes (Signed)
Nutrition  Received message that patient had left message cancelling nutrition appointment for today. Did not reschedule at this time.   Orla Estrin B. Zenia Resides, [place], [place] Registered Dietitian [phone] (mobile)

## 2020-05-28 ENCOUNTER — Encounter: Payer: Self-pay | Admitting: Oncology

## 2020-05-28 ENCOUNTER — Inpatient Hospital Stay (HOSPITAL_BASED_OUTPATIENT_CLINIC_OR_DEPARTMENT_OTHER): Payer: PPO | Admitting: Oncology

## 2020-05-28 VITALS — BP 96/73 | HR 109 | Temp 97.1°F | Resp 18 | Wt 150.7 lb

## 2020-05-28 DIAGNOSIS — C786 Secondary malignant neoplasm of retroperitoneum and peritoneum: Secondary | ICD-10-CM

## 2020-05-28 DIAGNOSIS — C541 Malignant neoplasm of endometrium: Secondary | ICD-10-CM

## 2020-05-28 DIAGNOSIS — I509 Heart failure, unspecified: Secondary | ICD-10-CM | POA: Diagnosis not present

## 2020-05-28 DIAGNOSIS — I739 Peripheral vascular disease, unspecified: Secondary | ICD-10-CM | POA: Diagnosis not present

## 2020-05-28 DIAGNOSIS — I872 Venous insufficiency (chronic) (peripheral): Secondary | ICD-10-CM | POA: Diagnosis not present

## 2020-05-28 DIAGNOSIS — Z90722 Acquired absence of ovaries, bilateral: Secondary | ICD-10-CM | POA: Diagnosis not present

## 2020-05-28 DIAGNOSIS — Z7901 Long term (current) use of anticoagulants: Secondary | ICD-10-CM | POA: Diagnosis not present

## 2020-05-28 DIAGNOSIS — Z79899 Other long term (current) drug therapy: Secondary | ICD-10-CM | POA: Diagnosis not present

## 2020-05-28 DIAGNOSIS — I428 Other cardiomyopathies: Secondary | ICD-10-CM | POA: Diagnosis not present

## 2020-05-28 DIAGNOSIS — Z90710 Acquired absence of both cervix and uterus: Secondary | ICD-10-CM | POA: Diagnosis not present

## 2020-05-28 DIAGNOSIS — I2510 Atherosclerotic heart disease of native coronary artery without angina pectoris: Secondary | ICD-10-CM | POA: Diagnosis not present

## 2020-05-28 DIAGNOSIS — Z7189 Other specified counseling: Secondary | ICD-10-CM | POA: Diagnosis not present

## 2020-05-28 DIAGNOSIS — E785 Hyperlipidemia, unspecified: Secondary | ICD-10-CM | POA: Diagnosis not present

## 2020-05-28 DIAGNOSIS — E8809 Other disorders of plasma-protein metabolism, not elsewhere classified: Secondary | ICD-10-CM | POA: Diagnosis not present

## 2020-05-28 DIAGNOSIS — N189 Chronic kidney disease, unspecified: Secondary | ICD-10-CM | POA: Diagnosis not present

## 2020-05-28 DIAGNOSIS — E039 Hypothyroidism, unspecified: Secondary | ICD-10-CM | POA: Diagnosis not present

## 2020-05-28 DIAGNOSIS — Z955 Presence of coronary angioplasty implant and graft: Secondary | ICD-10-CM | POA: Diagnosis not present

## 2020-05-28 DIAGNOSIS — Z87891 Personal history of nicotine dependence: Secondary | ICD-10-CM | POA: Diagnosis not present

## 2020-05-28 DIAGNOSIS — I130 Hypertensive heart and chronic kidney disease with heart failure and stage 1 through stage 4 chronic kidney disease, or unspecified chronic kidney disease: Secondary | ICD-10-CM | POA: Diagnosis not present

## 2020-05-28 DIAGNOSIS — Z8571 Personal history of Hodgkin lymphoma: Secondary | ICD-10-CM | POA: Diagnosis not present

## 2020-05-28 NOTE — Progress Notes (Signed)
Hematology/Oncology Consult note [facility]  [phone] [phone]  Patient Care Team: [name], MD as PCP - General (Internal Medicine) Wellington Hampshire, MD as PCP - Cardiology (Cardiology) [name], MD as Consulting Physician (General Surgery) Mellody Drown, MD as Referring Physician (Obstetrics and Gynecology) [name], MD as Referring Physician (Obstetrics and Gynecology) Hollice Espy, MD as Consulting Physician (Urology) [name], RN as Registered Nurse Sindy Guadeloupe, MD as Consulting Physician (Oncology)   Name of the patient: Jaime Hall  [id number]  07-Jun-1951   Date of visit: 05/28/20  Diagnosis- 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 She underwent robotic hysterectomy with bilateral salpingo-oophorectomy with washings and sentinel lymph node injection mapping and biopsy in February 2018. She had multiple postoperative complications and completed 3 cycles of carbotaxol chemotherapy in May 2018. Treatment was complicated by chemo-induced peripheral neuropathy for which Taxol dose was reduced by 25%. She completed 5 weeks of external beam whole pelvic radiation on August 2018.Her original tumor in 2018 was ER +1%. PR negative. MSI stable and HER-2 negative  She was under clinical surveillance and recently presented to [facility] with symptoms of right lower quadrant abdominal pain which led to aCT abdomen which showed new peritoneal metastatic disease throughout the right abdomen and pelvis with index masses measuring 5.7 x 4.7 cm. Head CT scan also showed evidence of hypermetabolic  abdominal pelvic peritoneal metastases but no evidence of other sites of metastases.  Repeat omental biopsy confirms high grade serous carcinoma.Foundation 1 testing showed following alteration/biomarkers. Microsatellite status stable. EPHB4 amplification, ESR 1 amplification, HNF1 A G 292 FS 25, pik3r1 loss PARP1 amplification 3 ER 1 loss, PPP 2R1 AAS 256F, PTEN loss, T p53. However none of these mutations have actionable targets.  Repeat echocardiogram showed EF of 30 to 35%. Cardiac cath unremarkable consistent with nonischemic cardiomyopathy.Therefore Doxil not chosen and she would be getting carboplatin and gemcitabine. Chemotherapy complicated by significant anemia causing supply demand cardiac ischemia and hospitalization.Patient switched to [facility] and lenvatinib starting 01/11/2020  Patient received two cycles of Keytruda but treatment washeldsince August 2021 due to repeated hospitalizations for possible heart failure followed by diagnosis of PE.   Interval history-patient recently took 1 dose of Lenvima And following that she again felt short of breath and was hospitalized for acute exacerbation of congestive heart failure and was discharged eventually.  She presently feels at her baseline and is not using any oxygen during the day but uses it as needed at night.  Has intermittent vaginal bleeding  ECOG PS- 2 Pain scale- 0   Review of systems- Review of Systems  Constitutional: Positive for malaise/fatigue. Negative for chills, fever and weight loss.  HENT: Negative for congestion, ear discharge and nosebleeds.   Eyes: Negative for blurred vision.  Respiratory: Negative for cough, hemoptysis, sputum production, shortness of breath and wheezing.   Cardiovascular: Negative for chest pain, palpitations, orthopnea and claudication.  Gastrointestinal: Negative for abdominal pain, blood in stool, constipation, diarrhea, heartburn, melena, nausea and vomiting.  Genitourinary:  Negative for dysuria, flank pain, frequency, hematuria and urgency.  Musculoskeletal: Negative for back pain, joint pain and myalgias.  Skin: Negative for rash.  Neurological: Negative for dizziness, tingling, focal  weakness, seizures, weakness and headaches.  Endo/Heme/Allergies: Does not bruise/bleed easily.  Psychiatric/Behavioral: Negative for depression and suicidal ideas. The patient does not have insomnia.       Allergies  Allergen Reactions  . Adhesive [Tape] Other (See Comments)    Skin Irritation   . Antifungal [Miconazole Nitrate] Rash  . Sulfa Antibiotics Nausea And Vomiting and Rash  . Z-Pak [Azithromycin] Itching     Past Medical History:  Diagnosis Date  . Anxiety   . Cervicalgia   . CHF (congestive heart failure) ([place])   . Chronic kidney disease   . Coronary artery disease    a. 02/2012 Stress echo: severe anterior wall ischemia;  b. 02/2012 Cath/PCI: LAD 95p (3.0 x 15 Xience EX DES), D1 90ost (PTCA - bifurcational dzs), EF 45% with anterior HK;  b. 02/2013 Ex MV: fixed anterior defect w/ minor reversibility, nl EF-->Med Rx.  . Endometrial cancer ([place])    a. 07/2016 s/p robotic hysterectomy, BSO w/ washings, sentinel node inj, mapping, bx, adhesiolysis.  . Essential hypertension, benign   . Fibrocystic breast disease   . GERD (gastroesophageal reflux disease)   . Gestational hypertension   . Heart murmur   . History of anemia   . History of blood transfusion   . Hodgkin's lymphoma ([place]) 2011   a. s/p radiation and chemo therapy  . Osteoarthritis   . Polycystic ovarian disease      Past Surgical History:  Procedure Laterality Date  . ABDOMINAL HYSTERECTOMY    . bladder sling    . CARDIAC CATHETERIZATION  02/2012   [facility] 1 stent place  . CERVICAL POLYPECTOMY    . CHOLECYSTECTOMY  1982  . COLONOSCOPY WITH PROPOFOL N/A 02/05/2015   Procedure: COLONOSCOPY WITH PROPOFOL;  Surgeon: Lucilla Lame, MD;  Location: [facility] ENDOSCOPY;  Service: Endoscopy;  Laterality:  N/A;  . CORONARY ANGIOPLASTY  02/2012   left/right s/p balloon  . CYSTOGRAM N/A 08/17/2016   Procedure: CYSTOGRAM;  Surgeon: Hollice Espy, MD;  Location: [facility] ORS;  Service: Urology;  Laterality: N/A;  . CYSTOSCOPY N/A 08/17/2016   Procedure: CYSTOSCOPY EXAM UNDER ANESTHESIA;  Surgeon: Hollice Espy, MD;  Location: [facility] ORS;  Service: Urology;  Laterality: N/A;  . CYSTOSCOPY W/ RETROGRADES Bilateral 08/17/2016   Procedure: CYSTOSCOPY WITH RETROGRADE PYELOGRAM;  Surgeon: Hollice Espy, MD;  Location: [facility] ORS;  Service: Urology;  Laterality: Bilateral;  . CYSTOSCOPY WITH STENT PLACEMENT Right 08/17/2016   Procedure: CYSTOSCOPY WITH STENT PLACEMENT;  Surgeon: Hollice Espy, MD;  Location: [facility] ORS;  Service: Urology;  Laterality: Right;  . heart stent'  2013  . kidney stent Right 2018  . LYMPH NODE BIOPSY  2011   diagnosis of hodgkins lymphoma  . PELVIC LYMPH NODE DISSECTION N/A 07/29/2016   Procedure: PELVIC/AORTIC LYMPH NODE SAMPLING;  Surgeon: [name], MD;  Location: [facility] ORS;  Service: Gynecology;  Laterality: N/A;  . PORTA CATH INSERTION N/A 09/22/2016   Procedure: Glori Luis Cath Insertion;  Surgeon: [name], MD;  Location: [place] CV LAB;  Service: Cardiovascular;  Laterality: N/A;  . PORTA CATH INSERTION N/A 10/27/2019   Procedure: PORTA CATH INSERTION;  Surgeon: [name], MD;  Location: [place] [facility];  Service: Cardiovascular;  Laterality: N/A;  . PORTA CATH REMOVAL N/A 11/17/2016   Procedure: Glori Luis Cath Removal;  Surgeon: [name], MD;  Location: [place] [facility];  Service: Cardiovascular;  Laterality: N/A;  . RIGHT/LEFT HEART CATH AND CORONARY ANGIOGRAPHY N/A 12/04/2019   Procedure: RIGHT/LEFT  HEART CATH AND CORONARY ANGIOGRAPHY;  Surgeon: Wellington Hampshire, MD;  Location: [place] [facility];  Service: Cardiovascular;  Laterality: N/A;  . ROBOTIC ASSISTED TOTAL HYSTERECTOMY WITH BILATERAL SALPINGO OOPHERECTOMY N/A 07/29/2016    Procedure: ROBOTIC ASSISTED TOTAL HYSTERECTOMY WITH BILATERAL SALPINGO OOPHORECTOMY;  Surgeon: [name], MD;  Location: [facility] ORS;  Service: Gynecology;  Laterality: N/A;  . SENTINEL NODE BIOPSY N/A 07/29/2016   Procedure: SENTINEL NODE BIOPSY;  Surgeon: [name], MD;  Location: [facility] ORS;  Service: Gynecology;  Laterality: N/A;  . transobturator sling N/A 2009   [place]    Social History   Socioeconomic History  . Marital status: Widowed    Spouse name: Not on file  . Number of children: Not on file  . Years of education: Not on file  . Highest education level: Not on file  Occupational History  . Not on file  Tobacco Use  . Smoking status: Former Smoker    Packs/day: 1.00    Years: 30.00    Pack years: 30.00    Types: Cigarettes    Quit date: 07/24/2002    Years since quitting: 17.8  . Smokeless tobacco: Never Used  . Tobacco comment: quit smoking in 2000  Vaping Use  . Vaping Use: Never used  Substance and Sexual Activity  . Alcohol use: Not Currently  . Drug use: No  . Sexual activity: Never  Other Topics Concern  . 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    Social Determinants of Health   Financial Resource Strain: Not on file  Food Insecurity: Not on file  Transportation Needs: Not on file  Physical Activity: Not on file  Stress: Not on file  Social Connections: Not on file  Intimate Partner Violence: Not on file    Family History  Problem Relation Age of Onset  . ALS Father   . Polymyositis Father   . Diabetes Brother   . Cancer Maternal Aunt        breast  . Breast cancer Maternal Aunt        30's  . Stroke Maternal Grandmother   . Cancer Maternal Grandfather        prostate  . Stroke Maternal Grandfather   . Colon cancer Maternal Aunt   . Non-Hodgkin's lymphoma Cousin      Current Outpatient Medications:  .  amitriptyline (ELAVIL)  25 MG tablet, Take 1 tablet (25 mg total) by mouth at bedtime. May increase weekly as needed, Disp: 90 tablet, Rfl: 3 .  apixaban (ELIQUIS) 2.5 MG TABS tablet, Take 1 tablet (2.5 mg total) by mouth 2 (two) times daily., Disp: 60 tablet, Rfl: 1 .  atorvastatin (LIPITOR) 10 MG tablet, Take 1 tablet by mouth once daily (Patient taking differently: Take 10 mg by mouth every evening.), Disp: 90 tablet, Rfl: 2 .  cholecalciferol (VITAMIN D3) 25 MCG (1000 UNIT) tablet, Take 1,000 Units by mouth in the morning and at bedtime., Disp: , Rfl:  .  cyanocobalamin (,VITAMIN B-12,) 1000 MCG/ML injection, INJECT 1ML IM WEEKLY FOR 4 WEEKS, THEN INJECT MONTHLY THEREAFTER (Patient taking differently: Inject 1,000 mcg into the skin every 30 (thirty) days.), Disp: 10 mL, Rfl: 0 .  empagliflozin (JARDIANCE) 10 MG TABS tablet, Take 1 tablet (10 mg total) by mouth daily., Disp: 30 tablet, Rfl: 0 .  levothyroxine (SYNTHROID) 75 MCG tablet, Take 1 tablet (  75 mcg total) by mouth daily before breakfast., Disp: 30 tablet, Rfl: 2 .  LORazepam (ATIVAN) 0.5 MG tablet, Take 1 tablet (0.5 mg total) by mouth every 6 (six) hours as needed for anxiety (and to help with breathing)., Disp: 120 tablet, Rfl: 0 .  pantoprazole (PROTONIX) 20 MG tablet, Take 1 tablet (20 mg total) by mouth daily., Disp: 60 tablet, Rfl: 1 .  potassium chloride (KLOR-CON) 10 MEQ tablet, Take 1 tablet (10 mEq total) by mouth daily., Disp: 90 tablet, Rfl: 1 .  furosemide (LASIX) 20 MG tablet, Take 1 tablet (20 mg total) by mouth daily. (Patient taking differently: Take 20 mg by mouth as needed for fluid or edema. ), Disp: 90 tablet, Rfl: 0 .  ivabradine (CORLANOR) 5 MG TABS tablet, Take 1 tablet (5 mg total) by mouth 2 (two) times daily with a meal. (Patient not taking: Reported on 05/28/2020), Disp: 60 tablet, Rfl: 1 .  lenvatinib 8 mg daily dose (LENVIMA, 8 MG DAILY DOSE,) 2 x 4 MG capsule, Take 2 capsules (8 mg total) by mouth daily. (Patient not taking: No sig  reported), Disp: 60 capsule, Rfl: 2 .  Morphine Sulfate (MORPHINE CONCENTRATE) 10 mg / 0.5 ml concentrated solution, Take 0.25 mLs (5 mg total) by mouth every 6 (six) hours as needed for severe pain or shortness of breath. (Patient not taking: Reported on 05/28/2020), Disp: 30 mL, Rfl: 0 No current facility-administered medications for this visit.  Facility-Administered Medications Ordered in Other Visits:  .  heparin lock flush 100 unit/mL, 500 Units, Intravenous, Once, Randa Evens C, MD .  sodium chloride flush (NS) 0.9 % injection 10 mL, 10 mL, Intravenous, PRN, Sindy Guadeloupe, MD, 10 mL at 12/07/19 0914 .  sodium chloride flush (NS) 0.9 % injection 10 mL, 10 mL, Intravenous, PRN, Sindy Guadeloupe, MD, 10 mL at 01/04/20 0850  Physical exam:  Vitals:   05/28/20 1115  BP: 96/73  Pulse: (!) 109  Resp: 18  Temp: (!) 97.1 F (36.2 C)  TempSrc: Tympanic  SpO2: 100%  Weight: 150 lb 11.2 oz (68.4 kg)   Physical Exam Constitutional:      General: She is not in acute distress. Eyes:     Extraocular Movements: EOM normal.  Cardiovascular:     Rate and Rhythm: Normal rate and regular rhythm.     Heart sounds: Normal heart sounds.  Pulmonary:     Effort: Pulmonary effort is normal.     Breath sounds: Normal breath sounds.  Abdominal:     General: Bowel sounds are normal.     Palpations: Abdomen is soft.  Musculoskeletal:     Cervical back: Normal range of motion.  Skin:    General: Skin is warm and dry.  Neurological:     Mental Status: She is alert and oriented to person, place, and time.      CMP Latest Ref Rng & Units 05/24/2020  Glucose 70 - 99 mg/dL 106(H)  BUN 8 - 23 mg/dL 20  Creatinine 0.44 - 1.00 mg/dL 1.52(H)  Sodium 135 - 145 mmol/L 138  Potassium 3.5 - 5.1 mmol/L 4.5  Chloride 98 - 111 mmol/L 102  CO2 22 - 32 mmol/L 27  Calcium 8.9 - 10.3 mg/dL 8.8(L)  Total Protein 6.5 - 8.1 g/dL -  Total Bilirubin 0.3 - 1.2 mg/dL -  Alkaline Phos 38 - 126 U/L -  AST 15 - 41  U/L -  ALT 0 - 44 U/L -   CBC Latest  Ref Rng & Units 05/20/2020  WBC 4.0 - 10.5 K/uL 7.9  Hemoglobin 12.0 - 15.0 g/dL 10.5(L)  Hematocrit 36.0 - 46.0 % 31.4(L)  Platelets 150 - 400 K/uL 240    No images are attached to the encounter.  DG Chest Portable 1 View  Result Date: 05/19/2020 CLINICAL DATA:  Shortness of breath, chest heaviness. EXAM: PORTABLE CHEST 1 VIEW COMPARISON:  Chest x-rays dated 02/19/2020 and 02/13/2020. FINDINGS: Heart size and mediastinal contours are stable. RIGHT chest wall Port-A-Cath is stable in position. The hazy opacities within each lung are stable compared to the most recent chest x-ray of 02/19/2020, but new compared to the earlier study of 02/13/2020. No new lung findings. No pleural effusion or pneumothorax is seen. Osseous structures about the chest are unremarkable. IMPRESSION: 1. Persistent hazy opacities within each lung, stable compared to the most recent chest x-ray of 02/19/2020, but new compared to chest x-ray of 02/13/2020. Differential includes persistent/recurrent pulmonary edema, interstitial pneumonia and hypersensitivity pneumonitis. 2. No new lung findings. Electronically Signed   By: Franki Cabot M.D.   On: 05/19/2020 08:32   ECHOCARDIOGRAM COMPLETE  Result Date: 05/20/2020    ECHOCARDIOGRAM REPORT   Patient Name:   CHARLISE GIOVANETTI Date of Exam: 05/20/2020 Medical Rec #:  [id number]         Height:       61.7 in Accession #:    [id number]        Weight:       148.4 lb Date of Birth:  February 22, 1951         BSA:          1.679 m Patient Age:    89 years          BP:           98/54 mmHg Patient Gender: F                 HR:           59 bpm. Exam Location:  [facility] Procedure: 2D Echo, Cardiac Doppler and Color Doppler Indications:     CHF- acute systolic 017.49  History:         Patient has prior history of Echocardiogram examinations, most                  recent 12/29/2019. CHF, Signs/Symptoms:Murmur; Risk                  Factors:Hypertension.  Sonographer:      Sherrie Sport RDCS (AE) Referring Phys:  [id number] Soledad Gerlach NIU Diagnosing Phys: Bartholome Bill MD  Sonographer Comments: Global longitudinal strain was attempted. IMPRESSIONS  1. Left ventricular ejection fraction, by estimation, is <20%. The left ventricle has severely decreased function. The left ventricle demonstrates global hypokinesis. The left ventricular internal cavity size was mildly dilated. Left ventricular diastolic parameters were normal.  2. Right ventricular systolic function is normal. The right ventricular size is normal.  3. Left atrial size was mildly dilated.  4. The mitral valve was not well visualized. Mild mitral valve regurgitation.  5. The aortic valve was not well visualized. Aortic valve regurgitation is mild to moderate. Mild to moderate aortic valve sclerosis/calcification is present, without any evidence of aortic stenosis. FINDINGS  Left Ventricle: Left ventricular ejection fraction, by estimation, is <20%. The left ventricle has severely decreased function. The left ventricle demonstrates global hypokinesis. The left ventricular internal cavity size was mildly dilated. There is no  left ventricular hypertrophy. Left  ventricular diastolic parameters were normal. Right Ventricle: The right ventricular size is normal. No increase in right ventricular wall thickness. Right ventricular systolic function is normal. Left Atrium: Left atrial size was mildly dilated. Right Atrium: Right atrial size was normal in size. Pericardium: There is no evidence of pericardial effusion. Mitral Valve: The mitral valve was not well visualized. Mild mitral valve regurgitation. Tricuspid Valve: The tricuspid valve is not well visualized. Tricuspid valve regurgitation is mild. Aortic Valve: The aortic valve was not well visualized. Aortic valve regurgitation is mild to moderate. Aortic regurgitation PHT measures 562 msec. Mild to moderate aortic valve sclerosis/calcification is present, without any evidence of aortic  stenosis.  Aortic valve mean gradient measures 4.7 mmHg. Aortic valve peak gradient measures 8.1 mmHg. Aortic valve area, by VTI measures 1.44 cm. Pulmonic Valve: The pulmonic valve was not well visualized. Pulmonic valve regurgitation is trivial. Aorta: The aortic root is normal in size and structure. IAS/Shunts: The interatrial septum was not assessed.  LEFT VENTRICLE PLAX 2D LVIDd:         5.22 cm      Diastology LVIDs:         4.98 cm      LV e' medial:    3.92 cm/s LV PW:         1.16 cm      LV E/e' medial:  19.9 LV IVS:        0.88 cm      LV e' lateral:   5.33 cm/s LVOT diam:     2.00 cm      LV E/e' lateral: 14.7 LV SV:         33 LV SV Index:   20 LVOT Area:     3.14 cm  LV Volumes (MOD) LV vol d, MOD A2C: 110.0 ml LV vol d, MOD A4C: 91.7 ml LV vol s, MOD A2C: 68.9 ml LV vol s, MOD A4C: 74.8 ml LV SV MOD A2C:     41.1 ml LV SV MOD A4C:     91.7 ml LV SV MOD BP:      30.1 ml RIGHT VENTRICLE RV Basal diam:  2.25 cm RV S prime:     13.10 cm/s TAPSE (M-mode): 2.9 cm LEFT ATRIUM             Index       RIGHT ATRIUM           Index LA diam:        3.80 cm 2.26 cm/m  RA Area:     10.70 cm LA Vol (A2C):   66.0 ml 39.32 ml/m RA Volume:   22.80 ml  13.58 ml/m LA Vol (A4C):   48.2 ml 28.72 ml/m LA Biplane Vol: 57.9 ml 34.49 ml/m  AORTIC VALVE                   PULMONIC VALVE AV Area (Vmax):    1.27 cm    PV Vmax:        0.74 m/s AV Area (Vmean):   1.09 cm    PV Peak grad:   2.2 mmHg AV Area (VTI):     1.44 cm    RVOT Peak grad: 2 mmHg AV Vmax:           142.33 cm/s AV Vmean:          96.833 cm/s AV VTI:            0.231 m AV Peak Grad:  8.1 mmHg AV Mean Grad:      4.7 mmHg LVOT Vmax:         57.60 cm/s LVOT Vmean:        33.700 cm/s LVOT VTI:          0.106 m LVOT/AV VTI ratio: 0.46 AI PHT:            562 msec  AORTA Ao Root diam: 2.70 cm MITRAL VALVE               TRICUSPID VALVE MV Area (PHT): 4.31 cm    TR Peak grad:   17.3 mmHg MV Decel Time: 176 msec    TR Vmax:        208.00 cm/s MV E velocity:  78.10 cm/s MV A velocity: 80.50 cm/s  SHUNTS MV E/A ratio:  0.97        Systemic VTI:  0.11 m                            Systemic Diam: 2.00 cm Bartholome Bill MD Electronically signed by Bartholome Bill MD Signature Date/Time: 05/20/2020/11:59:25 AM    Final      Assessment and plan- Patient is a 69 y.o. female with recurrent endometrial carcinoma and peritoneal metastases s/p 4 cycles of Keytruda here for post hospital discharge follow-up  After patient received diuresis for heart failure she is presently back at her baseline and not using oxygen during the day.  She uses oxygen mainly at night.  She continues to live alone.  Her CA 125 is rising and given the findings of vaginal bleeding and pelvic recurrence noted on GYN exam this is concerning for disease progression.  Unfortunately she has not been able to take Lenvima along with Keytruda.  She has not tolerated systemic chemotherapy well and I do not think that would be an option for her.  She was seen by Dr. [name] on 05/15/2020 and given that she had PIK3 mutation, everolimus/temsirolimus could be considered as potential targets.  At this time I will await repeat CT scan results before deciding the next course.  I am concerned that mtor inhibitors would be poorly tolerated in her case and we are probably looking at hospice transition at this time.  She will keep her appointments with me in 2 weeks as scheduled  Visit Diagnosis 1. Goals of care, counseling/discussion   2. Endometrial cancer ([place])   3. Peritoneal carcinomatosis [facility])      Dr. Randa Evens, MD, MPH [facility] [phone] 05/28/2020 2:09 PM

## 2020-05-28 NOTE — Telephone Encounter (Signed)
Called patient x2 to follow up with Transition of care. Patient was unable to complete the visit at this time. Patient notes she will call back and confirm appointment.

## 2020-05-28 NOTE — Telephone Encounter (Signed)
I started to do prior auth, but based off of questions, insurance will not approve. Patient is not on a beta blocker and does not have a CI to beta blockers. If blood pressure (being low) was the issue with being on carvedilol then I would recommend switching patient to metoprolol succinate which has much less affect on blood pressure.  If blood pressure falls below 90/50 (I dont think it would) on metoprolol, then insurance would most likely approve Corlanor.

## 2020-05-29 ENCOUNTER — Other Ambulatory Visit: Payer: Self-pay

## 2020-05-29 ENCOUNTER — Ambulatory Visit
Admission: RE | Admit: 2020-05-29 | Discharge: 2020-05-29 | Disposition: A | Discharge status: Home / Self Care | Payer: PPO | Source: Ambulatory Visit | Attending: Nurse Practitioner | Admitting: Nurse Practitioner

## 2020-05-29 DIAGNOSIS — C541 Malignant neoplasm of endometrium: Secondary | ICD-10-CM | POA: Insufficient documentation

## 2020-05-29 DIAGNOSIS — I700 Atherosclerosis of aorta: Secondary | ICD-10-CM | POA: Insufficient documentation

## 2020-05-29 DIAGNOSIS — K5730 Diverticulosis of large intestine without perforation or abscess without bleeding: Secondary | ICD-10-CM | POA: Insufficient documentation

## 2020-05-29 DIAGNOSIS — C787 Secondary malignant neoplasm of liver and intrahepatic bile duct: Secondary | ICD-10-CM | POA: Diagnosis not present

## 2020-05-29 DIAGNOSIS — R519 Headache, unspecified: Secondary | ICD-10-CM | POA: Insufficient documentation

## 2020-05-29 NOTE — Telephone Encounter (Signed)
[facility],  We can start Toprol XL 25 mg once daily for now to see if she can tolerate this.  In the hospital, her systolic blood pressure was running below 90 even without any antihypertensive medications so I am not entirely sure she will be able to tolerate this but it is worth trying so will have better chance of getting approval from her insurance.  Make sure she has a scheduled follow-up appointment in our office.

## 2020-05-29 NOTE — Telephone Encounter (Signed)
Spoke with patient and she is out of town due to her mother passing. She is not able to pick up any medications at this time but she did confirm appointment this Friday. Requested that she bring her medications in with her and she verbalized understanding with no further questions at this time.

## 2020-05-29 NOTE — Telephone Encounter (Signed)
Patient calling back in to check on status of what is going on

## 2020-05-30 NOTE — Telephone Encounter (Signed)
Prior authorization department from [place] called back regarding patient's Corlanor prescription.  Unlikely to be covered because patient is not on a beta blocker and does not have a contraindication to a beta blocker.  Will be faxing over more information.  Direct line back is [phone], option 3.  Case number [id number]

## 2020-05-30 NOTE — Telephone Encounter (Signed)
Patient has a 05/31/20 appt with Laurann Montana, NP. Closing this encounter.

## 2020-05-30 NOTE — Telephone Encounter (Signed)
Noted  

## 2020-05-31 ENCOUNTER — Other Ambulatory Visit: Payer: Self-pay

## 2020-05-31 ENCOUNTER — Ambulatory Visit (INDEPENDENT_AMBULATORY_CARE_PROVIDER_SITE_OTHER): Payer: PPO | Admitting: Family

## 2020-05-31 ENCOUNTER — Encounter: Payer: Self-pay | Admitting: Family

## 2020-05-31 VITALS — BP 128/78 | HR 118 | Ht 63.0 in | Wt 150.2 lb

## 2020-05-31 DIAGNOSIS — I959 Hypotension, unspecified: Secondary | ICD-10-CM | POA: Diagnosis not present

## 2020-05-31 DIAGNOSIS — I428 Other cardiomyopathies: Secondary | ICD-10-CM | POA: Diagnosis not present

## 2020-05-31 DIAGNOSIS — I471 Supraventricular tachycardia: Secondary | ICD-10-CM | POA: Diagnosis not present

## 2020-05-31 DIAGNOSIS — I5020 Unspecified systolic (congestive) heart failure: Secondary | ICD-10-CM

## 2020-05-31 DIAGNOSIS — E876 Hypokalemia: Secondary | ICD-10-CM

## 2020-05-31 DIAGNOSIS — I25118 Atherosclerotic heart disease of native coronary artery with other forms of angina pectoris: Secondary | ICD-10-CM | POA: Diagnosis not present

## 2020-05-31 MED ORDER — FUROSEMIDE 20 MG PO TABS: 20.0000 mg | ORAL_TABLET | ORAL | 2 refills | Status: DC | PRN

## 2020-05-31 MED ORDER — METOPROLOL SUCCINATE ER 25 MG PO TB24: 25.0000 mg | ORAL_TABLET | Freq: Every day | ORAL | 5 refills | Status: DC

## 2020-05-31 MED ORDER — POTASSIUM CHLORIDE ER 10 MEQ PO TBCR: 10.0000 meq | EXTENDED_RELEASE_TABLET | Freq: Every day | ORAL | 2 refills | Status: DC | PRN

## 2020-05-31 MED ORDER — EMPAGLIFLOZIN 10 MG PO TABS: 10.0000 mg | ORAL_TABLET | Freq: Every day | ORAL | 5 refills | Status: DC

## 2020-05-31 NOTE — Telephone Encounter (Signed)
Discussed with Karren Cobble, RPH prior to office visit with Miss Turri today. Per her insurance Ivabradine will be approved if she is on "max tolerated dose of beta blocker or there is contraindication to beta blocker". She had HR 118 by EKG today though BP improved compared to hospitalization with reading 128/78 in clinic. In clinic  today, we started Metoprolol Succinate 25mg  daily. Carvedilol has previously had to be discontinued due to hypotension.   She has follow up in one month. If she is still tachycardic at that time or with symptomatic heart failure, we will consider addition of Ivabradine. Hopeful insurance would reconsider at that time as she will be on beta blocker. Low suspicion we will be able to further escalate beta blocker therapy due to longstanding history of hypotension.   Discussed with Miss Linck in clinic and she was agreeable with this plan.  Loel Dubonnet, NP

## 2020-05-31 NOTE — Progress Notes (Signed)
Office Visit    Patient Name: Jaime Hall Date of Encounter: 05/31/2020  Primary Care Provider:  [name], MD Primary Cardiologist:  [name], MD Electrophysiologist:  None   Chief Complaint    Jaime Hall is a 69 y.o. female with a hx of CAD, Hodgkin's lymphoma, HFrEF, uterine cancer, PE  presents today for hospital follow up   Past Medical History    Past Medical History:  Diagnosis Date  . Anxiety   . Cervicalgia   . CHF (congestive heart failure) ([place])   . Chronic kidney disease   . Coronary artery disease    a. 02/2012 Stress echo: severe anterior wall ischemia;  b. 02/2012 Cath/PCI: LAD 95p (3.0 x 15 Xience EX DES), D1 90ost (PTCA - bifurcational dzs), EF 45% with anterior HK;  b. 02/2013 Ex MV: fixed anterior defect w/ minor reversibility, nl EF-->Med Rx.  . Endometrial cancer ([place])    a. 07/2016 s/p robotic hysterectomy, BSO w/ washings, sentinel node inj, mapping, bx, adhesiolysis.  . Essential hypertension, benign   . Fibrocystic breast disease   . GERD (gastroesophageal reflux disease)   . Gestational hypertension   . Heart murmur   . History of anemia   . History of blood transfusion   . Hodgkin's lymphoma ([place]) 2011   a. s/p radiation and chemo therapy  . Osteoarthritis   . Polycystic ovarian disease    Past Surgical History:  Procedure Laterality Date  . ABDOMINAL HYSTERECTOMY    . bladder sling    . CARDIAC CATHETERIZATION  02/2012   [facility] 1 stent place  . CERVICAL POLYPECTOMY    . CHOLECYSTECTOMY  1982  . COLONOSCOPY WITH PROPOFOL N/A 02/05/2015   Procedure: COLONOSCOPY WITH PROPOFOL;  Surgeon: Lucilla Lame, MD;  Location: [facility];  Service: Endoscopy;  Laterality: N/A;  . CORONARY ANGIOPLASTY  02/2012   left/right s/p balloon  . CYSTOGRAM N/A 08/17/2016   Procedure: CYSTOGRAM;  Surgeon: Hollice Espy, MD;  Location: [facility];  Service: Urology;  Laterality: N/A;  . CYSTOSCOPY N/A 08/17/2016   Procedure: CYSTOSCOPY EXAM UNDER  ANESTHESIA;  Surgeon: Hollice Espy, MD;  Location: [facility];  Service: Urology;  Laterality: N/A;  . CYSTOSCOPY W/ RETROGRADES Bilateral 08/17/2016   Procedure: CYSTOSCOPY WITH RETROGRADE PYELOGRAM;  Surgeon: Hollice Espy, MD;  Location: [facility];  Service: Urology;  Laterality: Bilateral;  . CYSTOSCOPY WITH STENT PLACEMENT Right 08/17/2016   Procedure: CYSTOSCOPY WITH STENT PLACEMENT;  Surgeon: Hollice Espy, MD;  Location: [facility];  Service: Urology;  Laterality: Right;  . heart stent'  2013  . kidney stent Right 2018  . LYMPH NODE BIOPSY  2011   diagnosis of hodgkins lymphoma  . PELVIC LYMPH NODE DISSECTION N/A 07/29/2016   Procedure: PELVIC/AORTIC LYMPH NODE SAMPLING;  Surgeon: [name], MD;  Location: [facility];  Service: Gynecology;  Laterality: N/A;  . PORTA CATH INSERTION N/A 09/22/2016   Procedure: Glori Luis Cath Insertion;  Surgeon: [name], MD;  Location: [place] [facility];  Service: Cardiovascular;  Laterality: N/A;  . PORTA CATH INSERTION N/A 10/27/2019   Procedure: PORTA CATH INSERTION;  Surgeon: [name], MD;  Location: [place] [facility];  Service: Cardiovascular;  Laterality: N/A;  . PORTA CATH REMOVAL N/A 11/17/2016   Procedure: Glori Luis Cath Removal;  Surgeon: [name], MD;  Location: [place] [facility];  Service: Cardiovascular;  Laterality: N/A;  . RIGHT/LEFT HEART CATH AND CORONARY ANGIOGRAPHY N/A 12/04/2019   Procedure: RIGHT/LEFT  HEART CATH AND CORONARY ANGIOGRAPHY;  Surgeon: Wellington Hampshire, MD;  Location: [place] [facility];  Service: Cardiovascular;  Laterality: N/A;  . ROBOTIC ASSISTED TOTAL HYSTERECTOMY WITH BILATERAL SALPINGO OOPHERECTOMY N/A 07/29/2016   Procedure: ROBOTIC ASSISTED TOTAL HYSTERECTOMY WITH BILATERAL SALPINGO OOPHORECTOMY;  Surgeon: [name], MD;  Location: [facility];  Service: Gynecology;  Laterality: N/A;  . SENTINEL NODE BIOPSY N/A 07/29/2016   Procedure: SENTINEL NODE BIOPSY;  Surgeon:  [name], MD;  Location: [facility];  Service: Gynecology;  Laterality: N/A;  . transobturator sling N/A 2009   [place]    Allergies  Allergies  Allergen Reactions  . Adhesive [Tape] Other (See Comments)    Skin Irritation   . Antifungal [Miconazole Nitrate] Rash  . Sulfa Antibiotics Nausea And Vomiting and Rash  . Z-Pak [Azithromycin] Itching    History of Present Illness    Jaime Hall is a 69 y.o. female with a hx of CAD (prior stent to LAD), Hodgkin's lymphoma, HFrEF, uterine and endometrial cancer, PE last seen while hospitalized.  History of Hodgkin's lymphoma treated with radiation and chemotherapy to the chest area previously in remission since 2011.  Uterine cancer in 2018 treated with surgery and chemotherapy.  She is a former smoker.  Her CAD is s/p angioplasty and DES to proximal LAD with balloon angioplasty to the diagonal September 2013.  Treadmill nuclear stress test September 2014 fixed anterior wall defect with minor reversibility and normal LVEF.    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 Seen in clinic 11/02/19 for clearance for chemotherapy. Echo 11/02/19 showed LVEF 30-35%, gr1DD, RV normal size and function, mildly elevated PASP, mild-moderate MR/AI. 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  Dr. Janese Banks, her oncologist, did change her chemotherapy agent due to the newly diagnosed heart failure. Subsequent stress test to rule out ischemic cardiomyopathy was high risk. R/LHC performed 12/04/19 with LVEF 30-35%, LVEDP moderately elevated, previous LAD stent patent, ost-prox LAD 40%. She was started on Lasix 20mg  daily. This was reduced to PRN due to orthostasis.    Presented to ED in [facility] after 1 week history of SOB and orthopnea she took her PRN Lsix but had vomitinga nd continues to void -  she was treated with IV diuresis. Echo 01/26/20 at outside facility EF 25-30%, severe global hypokinesis, RV normal size/function, moderate MR, mild AI without aortic stenosis. CT chest with no PE, pulmonary edema with small bilateral pleural effusions, soft tissue thickening bronchiectasis likely scar/post radiation changes.   At clinic 02/05/20 her Metoprolol was transitioned to Coreg and Losartan for optimization of GDMT for HR.   Subsequent admission 02/14/20-02/16/20 for worsening shortness of breath diagnosed with acute segmental PE in RLL treated with IV heparin and transitioned to Eliquis. She was readmitted 02/19/20 - 02/22/20 with acute respiratory distress secondary to acute on chronic HFrEF and hemoptysis. She was transitioned to Lasix 40mg  daily. Her Lipitor, Losartan, Coreg were continued. Due to her hemoptysis her Eliquis was discontinued. Her Levothyroxine dose was increase with recommendation for repeat thyroid function studies in 4-6 weeks. She was discharged with home health and oxygen as well as Lasix 40mg  daily.    She was readmitted 05/19/2020 due to shortness of breath in setting of acute on chronic combined systolic and diastolic heart failure.  She was treated with IV diuresis.  Echo 05/20/2020 with LVEF less  than 20%, global hypokinesis of the LV, LV mildly dilated, normal diastolic parameters, RV normal size and function, LA mildly dilated, mild MR, mild to moderate AI, mild to moderate aortic valve sclerosis without stenosis.  Her GDMT was limited due to hypotension.  Carvedilol had to be discontinued due to soft blood pressures. Corlanor was started but denied by insurance and as such, transitioned to Toprol 25mg  daily.   She presents today for hospital follow-up.  Her 54 year old mother passed away last week and I offered my condolences.  We discussed that this was expected due to her mother's age and dementia but still a very hard situation.  They have the funeral upcoming this  Sunday.  She reports feeling overall well since hospital discharge.  Does note that when her heart rate is elevated she feels more fatigued.  Reports no chest pain, pressure, tightness.  Reports her dyspnea on exertion is stable at baseline.  She takes many rests in between activity.  EKGs/Labs/Other Studies Reviewed:   The following studies were reviewed today:  Echo 05/20/20 1. Left ventricular ejection fraction, by estimation, is <20%. The left  ventricle has severely decreased function. The left ventricle demonstrates  global hypokinesis. The left ventricular internal cavity size was mildly  dilated. Left ventricular  diastolic parameters were normal.   2. Right ventricular systolic function is normal. The right ventricular  size is normal.   3. Left atrial size was mildly dilated.   4. The mitral valve was not well visualized. Mild mitral valve  regurgitation.   5. The aortic valve was not well visualized. Aortic valve regurgitation  is mild to moderate. Mild to moderate aortic valve sclerosis/calcification  is present, without any evidence of aortic stenosis.   Echo 11/02/19 IMPRESSIONS    1. Left ventricular ejection fraction, by estimation, is 30 to 35%. The  left ventricle has moderately decreased function. The left ventricle  demonstrates global hypokinesis. Left ventricular diastolic parameters are  consistent with Grade I diastolic  dysfunction (impaired relaxation).   2. Right ventricular systolic function is normal. The right ventricular  size is normal. There is mildly elevated pulmonary artery systolic  pressure. The estimated right ventricular systolic pressure is 77.8 mmHg.   3. Mild to moderate mitral valve regurgitation.   4. Aortic valve regurgitation is mild to moderate.   EKG:  EKG is ordered today.  The ekg ordered today demonstrates Sinus tachycardia 118 bpm with occasional premature supraventricular complexes with nonspecific ST/T wave changes.  Recent  Labs: 05/17/2020: TSH 0.637 05/19/2020: ALT 9 05/20/2020: Hemoglobin 10.5; Platelets 240 05/23/2020: B Natriuretic Peptide 500.2 05/24/2020: BUN 20; Creatinine, Ser 1.52; Magnesium 2.0; Potassium 4.5; Sodium 138  Recent Lipid Panel    Component Value Date/Time   CHOL 114 12/30/2019 0457   CHOL 110 04/11/2012 0835   TRIG 131 12/30/2019 0457   HDL 47 12/30/2019 0457   HDL 56 04/11/2012 0835   CHOLHDL 2.4 12/30/2019 0457   VLDL 26 12/30/2019 0457   LDLCALC 41 12/30/2019 0457   LDLCALC 41 04/11/2012 0835    Home Medications   Current Meds  Medication Sig  . amitriptyline (ELAVIL) 25 MG tablet Take 1 tablet (25 mg total) by mouth at bedtime. May increase weekly as needed  . apixaban (ELIQUIS) 2.5 MG TABS tablet Take 1 tablet (2.5 mg total) by mouth 2 (two) times daily.  Marland Kitchen atorvastatin (LIPITOR) 10 MG tablet Take 1 tablet by mouth once daily  . cholecalciferol (VITAMIN D3) 25 MCG (1000 UNIT) tablet  Take 1,000 Units by mouth in the morning and at bedtime.  . cyanocobalamin (,VITAMIN B-12,) 1000 MCG/ML injection INJECT 1ML IM WEEKLY FOR 4 WEEKS, THEN INJECT MONTHLY THEREAFTER (Patient taking differently: Inject 1,000 mcg into the skin every 30 (thirty) days.)  . levothyroxine (SYNTHROID) 75 MCG tablet Take 1 tablet (75 mcg total) by mouth daily before breakfast.  . LORazepam (ATIVAN) 0.5 MG tablet Take 1 tablet (0.5 mg total) by mouth every 6 (six) hours as needed for anxiety (and to help with breathing).  . Morphine Sulfate (MORPHINE CONCENTRATE) 10 mg / 0.5 ml concentrated solution Take 0.25 mLs (5 mg total) by mouth every 6 (six) hours as needed for severe pain or shortness of breath.  . pantoprazole (PROTONIX) 20 MG tablet Take 1 tablet (20 mg total) by mouth daily.  . [DISCONTINUED] empagliflozin (JARDIANCE) 10 MG TABS tablet Take 1 tablet (10 mg total) by mouth daily.  . [DISCONTINUED] furosemide (LASIX) 20 MG tablet Take 1 tablet (20 mg total) by mouth daily. (Patient taking differently:  Take 20 mg by mouth as needed for fluid or edema.)  . [DISCONTINUED] potassium chloride (KLOR-CON) 10 MEQ tablet Take 1 tablet (10 mEq total) by mouth daily. (Patient taking differently: Take 10 mEq by mouth daily as needed.)    Review of Systems   Review of Systems  Constitutional: Positive for malaise/fatigue. Negative for chills and fever.  Cardiovascular: Positive for dyspnea on exertion. Negative for chest pain, irregular heartbeat, leg swelling, near-syncope, orthopnea, palpitations and syncope.  Respiratory: Negative for cough, shortness of breath and wheezing.   Gastrointestinal: Negative for melena, nausea and vomiting.  Genitourinary: Negative for hematuria.  Neurological: Positive for weakness. Negative for dizziness and light-headedness.   All other systems reviewed and are otherwise negative except as noted above.  Physical Exam    VS:  BP 128/78 (BP Location: Left Arm, Patient Position: Sitting, Cuff Size: Large)   Pulse (!) 118   Ht 5\' 3"  (1.6 m)   Wt 150 lb 4 oz (68.2 kg)   SpO2 99%   BMI 26.62 kg/m  , BMI Body mass index is 26.62 kg/m.  Wt Readings from Last 3 Encounters:  05/31/20 150 lb 4 oz (68.2 kg)  05/28/20 150 lb 11.2 oz (68.4 kg)  05/24/20 151 lb 14.4 oz (68.9 kg)    GEN: Well nourished, well developed, in no acute distress. HEENT: normal. Neck: Supple, no JVD or masses. Cardiac: tachycardic, RRR, no murmurs, rubs, or gallops. No clubbing, cyanosis, edema.  Radials/PT 2+ and equal bilaterally.  Respiratory:  Respirations regular and unlabored, clear to auscultation bilaterally. GI: Soft, nontender, nondistended. MS: No deformity or atrophy. Skin: Warm and dry, no rash. Neuro:  Strength and sensation are intact. Psych: Normal affect.  Assessment & Plan    1. NICM/HFrEF - In setting of cancer and chemotherapy treatments.  Echo 7/62/21 LVEF 30-35%.  Echo 05/20/2020 LVEF less than 20%.  Optimization of heart failure therapies has been limited by  hypotension.  During recent hospitalization her carvedilol, losartan were discontinued due to hypotension with BP routinely 80-90s/40s.  She tolerated ivabradine while hospitalized but her insurance denied it.  As such, start Toprol 25 mg daily.  Low suspicion we will be able to escalate dose. If still with symptomatic heart failure at follow up despite beta blocker, will re-submit prior authorization to insurance for Ivabradine. Additional GDMT includes Jardiance 10mg  daily, refill and 4 weeks of samples provided in clinic. She will continue Lasix/Potassium as needed for weight  gain or fluid retention and is euvolemic on exam today while taking around 3 times per week.   2. CAD s/p prior PCI to LAD -no symptoms concerning for worsening angina.  Present GDMT includes statin with addition of beta-blocker, as above.  No aspirin secondary to chronic anticoagulation.  EKG today with no acute ST/T wave changes.  3. Multiple malignancies -follows with Dr. Janese Banks of oncology.  Difficulty with chemotherapy and immunotherapy agents due to heart failure.  CT abdomen 12/13/1 shows interval increase in size and number of omental implants in right lower abdomen, new bilateral subdiaphragmatic peritoneal implants, new hepatic metastatic disease, no evidence of metastatic disease in the chest. She has a good outlook regarding possible transition to palliative care and tells me she plans to "live one day at a time". Upcoming follow up with Dr. Janese Banks.  4. History of PE - Continue Eliquis 2.5 mg twice daily.  Denies bleeding complications.  5. PSVT -EKG today sinus tachycardia 118 bpm with occasional premature supraventricular complex.  She notes fatigue when her heart rate is elevated.  Previously intolerant to carvedilol due to hypotension. She was tolerating Ivabradine during recent admission but insurance denied.  Start Toprol 25 mg daily.  Disposition: Follow up in 1 month(s) with Dr. [name] or APP.   Signed, Loel Dubonnet, NP 05/31/2020, 11:26 AM [place]

## 2020-05-31 NOTE — Patient Instructions (Addendum)
Medication Instructions:  Your physician has recommended you make the following change in your medication:   START Metoprolol Succinate (Toprol) 25mg  daily  CONTINUE  Lasix and Potassium a few times per week  *If you need a refill on your cardiac medications before your next appointment, please call your pharmacy*  Lab Work: No lab work today.   Testing/Procedures: Your EKG today shows sinus tachycardia.  Follow-Up: At [facility], you and your health needs are our priority.  As part of our continuing mission to provide you with exceptional heart care, we have created designated Provider Care Teams.  These Care Teams include your primary Cardiologist (physician) and Advanced Practice Providers (APPs -  Physician Assistants and Nurse Practitioners) who all work together to provide you with the care you need, when you need it.  We recommend signing up for the patient portal called "MyChart".  Sign up information is provided on this After Visit Summary.  MyChart is used to connect with patients for Virtual Visits (Telemedicine).  Patients are able to view lab/test results, encounter notes, upcoming appointments, etc.  Non-urgent messages can be sent to your provider as well.   To learn more about what you can do with MyChart, go to [email or web address].    Your next appointment:   1 month(s)  The format for your next appointment:   In Person  Provider:   You may see [name], MD or one of the following Advanced Practice Providers on your designated Care Team:    Murray Hodgkins, NP  Christell Faith, PA-C  Marrianne Mood, PA-C  Cadence [place], [place]  Laurann [place], NP  Other Instructions  Continue to weigh yourself daily and keep a log. Continue to take your Lasix at least 3 times per week to stay at your dry weight.

## 2020-06-03 ENCOUNTER — Inpatient Hospital Stay: Payer: PPO | Admitting: Internal Medicine

## 2020-06-05 ENCOUNTER — Encounter: Payer: Self-pay | Admitting: *Deleted

## 2020-06-07 ENCOUNTER — Encounter: Payer: Self-pay | Admitting: Family

## 2020-06-09 ENCOUNTER — Encounter: Payer: Self-pay | Admitting: Oncology

## 2020-06-10 ENCOUNTER — Inpatient Hospital Stay: Payer: PPO

## 2020-06-10 ENCOUNTER — Other Ambulatory Visit: Payer: Self-pay | Admitting: Family

## 2020-06-10 ENCOUNTER — Inpatient Hospital Stay (HOSPITAL_BASED_OUTPATIENT_CLINIC_OR_DEPARTMENT_OTHER): Payer: PPO | Admitting: Oncology

## 2020-06-10 ENCOUNTER — Encounter: Payer: Self-pay | Admitting: Oncology

## 2020-06-10 VITALS — BP 109/54 | HR 109 | Temp 97.4°F | Resp 16 | Wt 149.1 lb

## 2020-06-10 DIAGNOSIS — C541 Malignant neoplasm of endometrium: Secondary | ICD-10-CM

## 2020-06-10 DIAGNOSIS — C786 Secondary malignant neoplasm of retroperitoneum and peritoneum: Secondary | ICD-10-CM

## 2020-06-10 DIAGNOSIS — Z7189 Other specified counseling: Secondary | ICD-10-CM | POA: Diagnosis not present

## 2020-06-10 DIAGNOSIS — I5022 Chronic systolic (congestive) heart failure: Secondary | ICD-10-CM

## 2020-06-10 LAB — COMPREHENSIVE METABOLIC PANEL
ALT: 11 U/L (ref 0–44)
AST: 20 U/L (ref 15–41)
Albumin: 3.2 g/dL — ABNORMAL LOW (ref 3.5–5.0)
Alkaline Phosphatase: 96 U/L (ref 38–126)
Anion gap: 14 (ref 5–15)
BUN: 17 mg/dL (ref 8–23)
CO2: 22 mmol/L (ref 22–32)
Calcium: 8.9 mg/dL (ref 8.9–10.3)
Chloride: 101 mmol/L (ref 98–111)
Creatinine, Ser: 1.51 mg/dL — ABNORMAL HIGH (ref 0.44–1.00)
GFR, Estimated: 37 mL/min — ABNORMAL LOW (ref >60)
Glucose, Bld: 99 mg/dL (ref 70–99)
Potassium: 3.3 mmol/L — ABNORMAL LOW (ref 3.5–5.1)
Sodium: 137 mmol/L (ref 135–145)
Total Bilirubin: 0.5 mg/dL (ref 0.3–1.2)
Total Protein: 7 g/dL (ref 6.5–8.1)

## 2020-06-10 LAB — CBC WITH DIFFERENTIAL/PLATELET
Abs Immature Granulocytes: 0.05 10*3/uL (ref 0.00–0.07)
Basophils Absolute: 0.1 10*3/uL (ref 0.0–0.1)
Basophils Relative: 1 %
Eosinophils Absolute: 1 10*3/uL — ABNORMAL HIGH (ref 0.0–0.5)
Eosinophils Relative: 11 %
HCT: 31.1 % — ABNORMAL LOW (ref 36.0–46.0)
Hemoglobin: 9.6 g/dL — ABNORMAL LOW (ref 12.0–15.0)
Immature Granulocytes: 1 %
Lymphocytes Relative: 8 %
Lymphs Abs: 0.8 10*3/uL (ref 0.7–4.0)
MCH: 27 pg (ref 26.0–34.0)
MCHC: 30.9 g/dL (ref 30.0–36.0)
MCV: 87.4 fL (ref 80.0–100.0)
Monocytes Absolute: 0.5 10*3/uL (ref 0.1–1.0)
Monocytes Relative: 5 %
Neutro Abs: 7.4 10*3/uL (ref 1.7–7.7)
Neutrophils Relative %: 74 %
Platelets: 268 10*3/uL (ref 150–400)
RBC: 3.56 MIL/uL — ABNORMAL LOW (ref 3.87–5.11)
RDW: 16 % — ABNORMAL HIGH (ref 11.5–15.5)
WBC: 9.8 10*3/uL (ref 4.0–10.5)
nRBC: 0 % (ref 0.0–0.2)

## 2020-06-10 MED ORDER — LEVOTHYROXINE SODIUM 75 MCG PO TABS
75.0000 ug | ORAL_TABLET | Freq: Every day | ORAL | 2 refills | Status: DC
Start: 2020-06-10 — End: 2020-07-15

## 2020-06-10 NOTE — Progress Notes (Signed)
Hematology/Oncology Consult note [facility]  Telephone:([phone] [phone]  Patient Care Team: [name], MD as PCP - General (Internal Medicine) Wellington Hampshire, MD as PCP - Cardiology (Cardiology) [name], MD as Consulting Physician (General Surgery) Mellody Drown, MD as Referring Physician (Obstetrics and Gynecology) [name], MD as Referring Physician (Obstetrics and Gynecology) Hollice Espy, MD as Consulting Physician (Urology) [name], RN as Registered Nurse Sindy Guadeloupe, MD as Consulting Physician (Oncology)   Name of the patient: Davanee Klinkner  [id number]  10/23/1950   Date of visit: 06/10/20  Diagnosis- 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 She underwent robotic hysterectomy with bilateral salpingo-oophorectomy with washings and sentinel lymph node injection mapping and biopsy in February 2018. She had multiple postoperative complications and completed 3 cycles of carbotaxol chemotherapy in May 2018. Treatment was complicated by chemo-induced peripheral neuropathy for which Taxol dose was reduced by 25%. She completed 5 weeks of external beam whole pelvic radiation on August 2018.Her original tumor in 2018 was ER +1%. PR negative. MSI stable and HER-2 negative  She was under clinical surveillance and recently presented to [name] with symptoms of right lower quadrant abdominal pain which led to aCT abdomen which showed new peritoneal metastatic disease throughout the right abdomen and pelvis with index masses measuring 5.7 x 4.7 cm. Head CT scan also  showed evidence of hypermetabolic abdominal pelvic peritoneal metastases but no evidence of other sites of metastases.  Repeat omental biopsy confirms high grade serous carcinoma.Foundation 1 testing showed following alteration/biomarkers. Microsatellite status stable. EPHB4 amplification, ESR 1 amplification, HNF1 A G 292 FS 25, pik3r1 loss PARP1 amplification 3 ER 1 loss, PPP 2R1 AAS 256F, PTEN loss, T p53. However none of these mutations have actionable targets.  Repeat echocardiogram showed EF of 30 to 35%. Cardiac cath unremarkable consistent with nonischemic cardiomyopathy.Therefore Doxil not chosen and she would be getting carboplatin and gemcitabine. Chemotherapy complicated by significant anemia causing supply demand cardiac ischemia and hospitalization.Patient switched to [facility] and lenvatinib starting 01/11/2020  Patient received two cycles of Keytruda but treatment washeldsince August 2021 due to repeated hospitalizations for possible heart failure followed by diagnosis of PE.   Interval history-patient's mother passed on Jun 09, 2020.  She is at her baseline presently and has not had any hospitalizations in the last 2 to 3 weeks.  There are times when her heart rate goes up to 150s or 180s.  She had 1 such episode which came down on its own.  She uses oxygen as needed at night.  Reports occasional abdominal pain which comes and goes and she has used 1 or 2 doses of oxycodone so far  ECOG PS- 1 Pain scale- 0 Opioid associated constipation- no  Review of systems- Review of Systems  Constitutional: Positive for malaise/fatigue. Negative for chills, fever and weight loss.  HENT: Negative for congestion, ear discharge and nosebleeds.   Eyes: Negative for blurred vision.  Respiratory: Positive for shortness of breath. Negative for cough, hemoptysis, sputum production and wheezing.   Cardiovascular: Negative for chest pain, palpitations, orthopnea and claudication.   Gastrointestinal: Positive for abdominal pain. Negative for blood in stool, constipation, diarrhea, heartburn, melena,  nausea and vomiting.  Genitourinary: Negative for dysuria, flank pain, frequency, hematuria and urgency.  Musculoskeletal: Negative for back pain, joint pain and myalgias.  Skin: Negative for rash.  Neurological: Negative for dizziness, tingling, focal weakness, seizures, weakness and headaches.  Endo/Heme/Allergies: Does not bruise/bleed easily.  Psychiatric/Behavioral: Negative for depression and suicidal ideas. The patient does not have insomnia.        Allergies  Allergen Reactions  . Adhesive [Tape] Other (See Comments)    Skin Irritation   . Antifungal [Miconazole Nitrate] Rash  . Sulfa Antibiotics Nausea And Vomiting and Rash  . Z-Pak [Azithromycin] Itching     Past Medical History:  Diagnosis Date  . Anxiety   . Cervicalgia   . CHF (congestive heart failure) ([place])   . Chronic kidney disease   . Coronary artery disease    a. 02/2012 Stress echo: severe anterior wall ischemia;  b. 02/2012 Cath/PCI: LAD 95p (3.0 x 15 Xience EX DES), D1 90ost (PTCA - bifurcational dzs), EF 45% with anterior HK;  b. 02/2013 Ex MV: fixed anterior defect w/ minor reversibility, nl EF-->Med Rx.  . Endometrial cancer ([place])    a. 07/2016 s/p robotic hysterectomy, BSO w/ washings, sentinel node inj, mapping, bx, adhesiolysis.  . Essential hypertension, benign   . Fibrocystic breast disease   . GERD (gastroesophageal reflux disease)   . Gestational hypertension   . Heart murmur   . History of anemia   . History of blood transfusion   . Hodgkin's lymphoma ([place]) 2011   a. s/p radiation and chemo therapy  . Osteoarthritis   . Polycystic ovarian disease      Past Surgical History:  Procedure Laterality Date  . ABDOMINAL HYSTERECTOMY    . bladder sling    . CARDIAC CATHETERIZATION  02/2012   [facility] 1 stent place  . CERVICAL POLYPECTOMY    . CHOLECYSTECTOMY  1982  . COLONOSCOPY  WITH PROPOFOL N/A 02/05/2015   Procedure: COLONOSCOPY WITH PROPOFOL;  Surgeon: Lucilla Lame, MD;  Location: [facility] ENDOSCOPY;  Service: Endoscopy;  Laterality: N/A;  . CORONARY ANGIOPLASTY  02/2012   left/right s/p balloon  . CYSTOGRAM N/A 08/17/2016   Procedure: CYSTOGRAM;  Surgeon: Hollice Espy, MD;  Location: [facility] ORS;  Service: Urology;  Laterality: N/A;  . CYSTOSCOPY N/A 08/17/2016   Procedure: CYSTOSCOPY EXAM UNDER ANESTHESIA;  Surgeon: Hollice Espy, MD;  Location: [facility] ORS;  Service: Urology;  Laterality: N/A;  . CYSTOSCOPY W/ RETROGRADES Bilateral 08/17/2016   Procedure: CYSTOSCOPY WITH RETROGRADE PYELOGRAM;  Surgeon: Hollice Espy, MD;  Location: [facility] ORS;  Service: Urology;  Laterality: Bilateral;  . CYSTOSCOPY WITH STENT PLACEMENT Right 08/17/2016   Procedure: CYSTOSCOPY WITH STENT PLACEMENT;  Surgeon: Hollice Espy, MD;  Location: [facility] ORS;  Service: Urology;  Laterality: Right;  . heart stent'  2013  . kidney stent Right 2018  . LYMPH NODE BIOPSY  2011   diagnosis of hodgkins lymphoma  . PELVIC LYMPH NODE DISSECTION N/A 07/29/2016   Procedure: PELVIC/AORTIC LYMPH NODE SAMPLING;  Surgeon: [name], MD;  Location: [facility] ORS;  Service: Gynecology;  Laterality: N/A;  . PORTA CATH INSERTION N/A 09/22/2016   Procedure: Glori Luis Cath Insertion;  Surgeon: [name], MD;  Location: [place] [facility];  Service: Cardiovascular;  Laterality: N/A;  . PORTA CATH INSERTION N/A 10/27/2019   Procedure: PORTA CATH INSERTION;  Surgeon: [name], MD;  Location: [place] [facility];  Service: Cardiovascular;  Laterality: N/A;  . PORTA CATH REMOVAL N/A 11/17/2016  Procedure: Porta Cath Removal;  Surgeon: [name], MD;  Location: [place] [facility];  Service: Cardiovascular;  Laterality: N/A;  . RIGHT/LEFT HEART CATH AND CORONARY ANGIOGRAPHY N/A 12/04/2019   Procedure: RIGHT/LEFT HEART CATH AND CORONARY ANGIOGRAPHY;  Surgeon: Wellington Hampshire, MD;  Location: [place] [facility];  Service: Cardiovascular;  Laterality: N/A;  . ROBOTIC ASSISTED TOTAL HYSTERECTOMY WITH BILATERAL SALPINGO OOPHERECTOMY N/A 07/29/2016   Procedure: ROBOTIC ASSISTED TOTAL HYSTERECTOMY WITH BILATERAL SALPINGO OOPHORECTOMY;  Surgeon: [name], MD;  Location: [facility] ORS;  Service: Gynecology;  Laterality: N/A;  . SENTINEL NODE BIOPSY N/A 07/29/2016   Procedure: SENTINEL NODE BIOPSY;  Surgeon: [name], MD;  Location: [facility] OR[facility];  Service: Gynecology;  Laterality: N/A;  . transobturator sling N/A 2009   [place]    Social History   Socioeconomic History  . Marital status: Widowed    Spouse name: Not on file  . Number of children: Not on file  . Years of education: Not on file  . Highest education level: Not on file  Occupational History  . Not on file  Tobacco Use  . Smoking status: Former Smoker    Packs/day: 1.00    Years: 30.00    Pack years: 30.00    Types: Cigarettes    Quit date: 07/24/2002    Years since quitting: 17.8  . Smokeless tobacco: Never Used  . Tobacco comment: quit smoking in 2000  Vaping Use  . Vaping Use: Never used  Substance and Sexual Activity  . Alcohol use: Not Currently  . Drug use: No  . Sexual activity: Never  Other Topics Concern  . 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    Social Determinants of Health   Financial Resource Strain: Not on file  Food Insecurity: Not on file  Transportation Needs: Not on file  Physical Activity: Not on file  Stress: Not on file  Social Connections: Not on file  Intimate Partner Violence: Not on file    Family History  Problem Relation Age of Onset  . ALS Father   . Polymyositis Father   . Diabetes Brother   . Cancer Maternal Aunt        breast  . Breast cancer Maternal Aunt        30's  . Stroke Maternal Grandmother   . Cancer Maternal Grandfather        prostate  . Stroke  Maternal Grandfather   . Colon cancer Maternal Aunt   . Non-Hodgkin's lymphoma Cousin      Current Outpatient Medications:  .  amitriptyline (ELAVIL) 25 MG tablet, Take 1 tablet (25 mg total) by mouth at bedtime. May increase weekly as needed, Disp: 90 tablet, Rfl: 3 .  apixaban (ELIQUIS) 2.5 MG TABS tablet, Take 1 tablet (2.5 mg total) by mouth 2 (two) times daily., Disp: 60 tablet, Rfl: 1 .  atorvastatin (LIPITOR) 10 MG tablet, Take 1 tablet by mouth once daily, Disp: 90 tablet, Rfl: 2 .  cholecalciferol (VITAMIN D3) 25 MCG (1000 UNIT) tablet, Take 1,000 Units by mouth in the morning and at bedtime., Disp: , Rfl:  .  cyanocobalamin (,VITAMIN B-12,) 1000 MCG/ML injection, INJECT 1ML IM WEEKLY FOR 4 WEEKS, THEN INJECT MONTHLY THEREAFTER (Patient taking differently: Inject 1,000 mcg into the skin every 30 (thirty) days.), Disp: 10 mL, Rfl: 0 .  empagliflozin (JARDIANCE) 10 MG  TABS tablet, Take 1 tablet (10 mg total) by mouth daily., Disp: 30 tablet, Rfl: 5 .  furosemide (LASIX) 20 MG tablet, Take 1 tablet (20 mg total) by mouth as needed for fluid or edema., Disp: 30 tablet, Rfl: 2 .  levothyroxine (SYNTHROID) 75 MCG tablet, Take 1 tablet (75 mcg total) by mouth daily before breakfast., Disp: 30 tablet, Rfl: 2 .  LORazepam (ATIVAN) 0.5 MG tablet, Take 1 tablet (0.5 mg total) by mouth every 6 (six) hours as needed for anxiety (and to help with breathing)., Disp: 120 tablet, Rfl: 0 .  metoprolol succinate (TOPROL XL) 25 MG 24 hr tablet, Take 1 tablet (25 mg total) by mouth daily., Disp: 30 tablet, Rfl: 5 .  Morphine Sulfate (MORPHINE CONCENTRATE) 10 mg / 0.5 ml concentrated solution, Take 0.25 mLs (5 mg total) by mouth every 6 (six) hours as needed for severe pain or shortness of breath., Disp: 30 mL, Rfl: 0 .  pantoprazole (PROTONIX) 20 MG tablet, Take 1 tablet (20 mg total) by mouth daily., Disp: 60 tablet, Rfl: 1 .  potassium chloride (KLOR-CON) 10 MEQ tablet, Take 1 tablet (10 mEq total) by mouth  daily as needed (Take on days that you take the Lasix.)., Disp: 30 tablet, Rfl: 2 No current facility-administered medications for this visit.  Facility-Administered Medications Ordered in Other Visits:  .  heparin lock flush 100 unit/mL, 500 Units, Intravenous, Once, Randa Evens C, MD .  sodium chloride flush (NS) 0.9 % injection 10 mL, 10 mL, Intravenous, PRN, Sindy Guadeloupe, MD, 10 mL at 12/07/19 0914 .  sodium chloride flush (NS) 0.9 % injection 10 mL, 10 mL, Intravenous, PRN, Sindy Guadeloupe, MD, 10 mL at 01/04/20 0850  Physical exam:  Vitals:   06/10/20 0837  BP: (!) 109/54  Pulse: (!) 109  Resp: 16  Temp: (!) 97.4 F (36.3 C)  TempSrc: Tympanic  SpO2: 100%  Weight: 149 lb 1.6 oz (67.6 kg)   Physical Exam HENT:     Head: Normocephalic and atraumatic.  Eyes:     Extraocular Movements: EOM normal.     Pupils: Pupils are equal, round, and reactive to light.  Cardiovascular:     Rate and Rhythm: Normal rate and regular rhythm.     Heart sounds: Normal heart sounds.  Pulmonary:     Effort: Pulmonary effort is normal.     Breath sounds: Normal breath sounds.  Abdominal:     General: Bowel sounds are normal.     Palpations: Abdomen is soft.  Musculoskeletal:     Cervical back: Normal range of motion.  Skin:    General: Skin is warm and dry.  Neurological:     Mental Status: She is alert and oriented to person, place, and time.      CMP Latest Ref Rng & Units 05/24/2020  Glucose 70 - 99 mg/dL 106(H)  BUN 8 - 23 mg/dL 20  Creatinine 0.44 - 1.00 mg/dL 1.52(H)  Sodium 135 - 145 mmol/L 138  Potassium 3.5 - 5.1 mmol/L 4.5  Chloride 98 - 111 mmol/L 102  CO2 22 - 32 mmol/L 27  Calcium 8.9 - 10.3 mg/dL 8.8(L)  Total Protein 6.5 - 8.1 g/dL -  Total Bilirubin 0.3 - 1.2 mg/dL -  Alkaline Phos 38 - 126 U/L -  AST 15 - 41 U/L -  ALT 0 - 44 U/L -   CBC Latest Ref Rng & Units 05/20/2020  WBC 4.0 - 10.5 K/uL 7.9  Hemoglobin 12.0 -  15.0 g/dL 10.5(L)  Hematocrit 36.0 - 46.0 %  31.4(L)  Platelets 150 - 400 K/uL 240    No images are attached to the encounter.  CT Head Wo Contrast  Result Date: 05/30/2020 CLINICAL DATA:  Headache EXAM: CT HEAD WITHOUT CONTRAST TECHNIQUE: Contiguous axial images were obtained from the base of the skull through the vertex without intravenous contrast. COMPARISON:  None. FINDINGS: Brain: No acute intracranial abnormality. Specifically, no hemorrhage, hydrocephalus, mass lesion, acute infarction, or significant intracranial injury. Vascular: No hyperdense vessel or unexpected calcification. Skull: No acute calvarial abnormality. Sinuses/Orbits: No acute findings Other: None IMPRESSION: No acute intracranial abnormality. Electronically Signed   By: Rolm Baptise M.D.   On: 05/30/2020 03:56   DG Chest Portable 1 View  Result Date: 05/19/2020 CLINICAL DATA:  Shortness of breath, chest heaviness. EXAM: PORTABLE CHEST 1 VIEW COMPARISON:  Chest x-rays dated 02/19/2020 and 02/13/2020. FINDINGS: Heart size and mediastinal contours are stable. RIGHT chest wall Port-A-Cath is stable in position. The hazy opacities within each lung are stable compared to the most recent chest x-ray of 02/19/2020, but new compared to the earlier study of 02/13/2020. No new lung findings. No pleural effusion or pneumothorax is seen. Osseous structures about the chest are unremarkable. IMPRESSION: 1. Persistent hazy opacities within each lung, stable compared to the most recent chest x-ray of 02/19/2020, but new compared to chest x-ray of 02/13/2020. Differential includes persistent/recurrent pulmonary edema, interstitial pneumonia and hypersensitivity pneumonitis. 2. No new lung findings. Electronically Signed   By: Franki Cabot M.D.   On: 05/19/2020 08:32   ECHOCARDIOGRAM COMPLETE  Result Date: 05/20/2020    ECHOCARDIOGRAM REPORT   Patient Name:   LILEY RAKE Date of Exam: 05/20/2020 Medical Rec #:  [id number]         Height:       61.7 in Accession #:    [id number]         Weight:       148.4 lb Date of Birth:  [birth year]         BSA:          1.679 m Patient Age:    69 years          BP:           98/54 mmHg Patient Gender: F                 HR:           59 bpm. Exam Location:  [facility] Procedure: 2D Echo, Cardiac Doppler and Color Doppler Indications:     CHF- acute systolic 606.30  History:         Patient has prior history of Echocardiogram examinations, most                  recent 12/29/2019. CHF, Signs/Symptoms:Murmur; Risk                  Factors:Hypertension.  Sonographer:     Sherrie Sport RDCS (AE) Referring Phys:  [id number] Soledad Gerlach NIU Diagnosing Phys: Bartholome Bill MD  Sonographer Comments: Global longitudinal strain was attempted. IMPRESSIONS  1. Left ventricular ejection fraction, by estimation, is <20%. The left ventricle has severely decreased function. The left ventricle demonstrates global hypokinesis. The left ventricular internal cavity size was mildly dilated. Left ventricular diastolic parameters were normal.  2. Right ventricular systolic function is normal. The right ventricular size is normal.  3. Left atrial size was mildly dilated.  4. The mitral valve  was not well visualized. Mild mitral valve regurgitation.  5. The aortic valve was not well visualized. Aortic valve regurgitation is mild to moderate. Mild to moderate aortic valve sclerosis/calcification is present, without any evidence of aortic stenosis. FINDINGS  Left Ventricle: Left ventricular ejection fraction, by estimation, is <20%. The left ventricle has severely decreased function. The left ventricle demonstrates global hypokinesis. The left ventricular internal cavity size was mildly dilated. There is no  left ventricular hypertrophy. Left ventricular diastolic parameters were normal. Right Ventricle: The right ventricular size is normal. No increase in right ventricular wall thickness. Right ventricular systolic function is normal. Left Atrium: Left atrial size was mildly dilated. Right Atrium: Right  atrial size was normal in size. Pericardium: There is no evidence of pericardial effusion. Mitral Valve: The mitral valve was not well visualized. Mild mitral valve regurgitation. Tricuspid Valve: The tricuspid valve is not well visualized. Tricuspid valve regurgitation is mild. Aortic Valve: The aortic valve was not well visualized. Aortic valve regurgitation is mild to moderate. Aortic regurgitation PHT measures 562 msec. Mild to moderate aortic valve sclerosis/calcification is present, without any evidence of aortic stenosis.  Aortic valve mean gradient measures 4.7 mmHg. Aortic valve peak gradient measures 8.1 mmHg. Aortic valve area, by VTI measures 1.44 cm. Pulmonic Valve: The pulmonic valve was not well visualized. Pulmonic valve regurgitation is trivial. Aorta: The aortic root is normal in size and structure. IAS/Shunts: The interatrial septum was not assessed.  LEFT VENTRICLE PLAX 2D LVIDd:         5.22 cm      Diastology LVIDs:         4.98 cm      LV e' medial:    3.92 cm/s LV PW:         1.16 cm      LV E/e' medial:  19.9 LV IVS:        0.88 cm      LV e' lateral:   5.33 cm/s LVOT diam:     2.00 cm      LV E/e' lateral: 14.7 LV SV:         33 LV SV Index:   20 LVOT Area:     3.14 cm  LV Volumes (MOD) LV vol d, MOD A2C: 110.0 ml LV vol d, MOD A4C: 91.7 ml LV vol s, MOD A2C: 68.9 ml LV vol s, MOD A4C: 74.8 ml LV SV MOD A2C:     41.1 ml LV SV MOD A4C:     91.7 ml LV SV MOD BP:      30.1 ml RIGHT VENTRICLE RV Basal diam:  2.25 cm RV S prime:     13.10 cm/s TAPSE (M-mode): 2.9 cm LEFT ATRIUM             Index       RIGHT ATRIUM           Index LA diam:        3.80 cm 2.26 cm/m  RA Area:     10.70 cm LA Vol (A2C):   66.0 ml 39.32 ml/m RA Volume:   22.80 ml  13.58 ml/m LA Vol (A4C):   48.2 ml 28.72 ml/m LA Biplane Vol: 57.9 ml 34.49 ml/m  AORTIC VALVE                   PULMONIC VALVE AV Area (Vmax):    1.27 cm    PV Vmax:        0.74  m/s AV Area (Vmean):   1.09 cm    PV Peak grad:   2.2 mmHg AV Area  (VTI):     1.44 cm    RVOT Peak grad: 2 mmHg AV Vmax:           142.33 cm/s AV Vmean:          96.833 cm/s AV VTI:            0.231 m AV Peak Grad:      8.1 mmHg AV Mean Grad:      4.7 mmHg LVOT Vmax:         57.60 cm/s LVOT Vmean:        33.700 cm/s LVOT VTI:          0.106 m LVOT/AV VTI ratio: 0.46 AI PHT:            562 msec  AORTA Ao Root diam: 2.70 cm MITRAL VALVE               TRICUSPID VALVE MV Area (PHT): 4.31 cm    TR Peak grad:   17.3 mmHg MV Decel Time: 176 msec    TR Vmax:        208.00 cm/s MV E velocity: 78.10 cm/s MV A velocity: 80.50 cm/s  SHUNTS MV E/A ratio:  0.97        Systemic VTI:  0.11 m                            Systemic Diam: 2.00 cm Bartholome Bill MD Electronically signed by Bartholome Bill MD Signature Date/Time: 05/20/2020/11:59:25 AM    Final    CT CHEST ABDOMEN PELVIS WO CONTRAST  Result Date: 05/29/2020 CLINICAL DATA:  69 year old female with endometrial cancer restaging. EXAM: CT CHEST, ABDOMEN AND PELVIS WITHOUT CONTRAST TECHNIQUE: Multidetector CT imaging of the chest, abdomen and pelvis was performed following the standard protocol without IV contrast. COMPARISON:  Chest CT dated 02/14/2020 and CT dated 01/16/2020. FINDINGS: Evaluation of this exam is limited in the absence of intravenous contrast. CT CHEST FINDINGS Cardiovascular: Top-normal cardiac size. No pericardial effusion. There is hypoattenuation of the cardiac blood pool suggestive of anemia. Clinical correlation is recommended. Right-sided Port-A-Cath with tip at the cavoatrial junction. There is coronary vascular calcification or stent involving the LAD. Mild atherosclerotic calcification of the aortic arch. No aneurysmal dilatation. The central pulmonary arteries are grossly unremarkable on this noncontrast CT. Mediastinum/Nodes: No hilar or mediastinal adenopathy. The esophagus is grossly unremarkable. No mediastinal fluid collection. Lungs/Pleura: Bilateral paramedian radiation fibrosis as seen previously. No new  consolidative changes. There is no pleural effusion pneumothorax. The central airways are patent. Musculoskeletal: Mild degenerative changes. No acute osseous pathology. CT ABDOMEN PELVIS FINDINGS No intra-abdominal free air or free fluid. Hepatobiliary: There is a 3.6 x 2.6 cm hypodense lesion in the right lobe of the liver, new since the CT of 02/14/2020 consistent with new metastatic disease. Additional 2.5 x 1.7 cm hypodense lesion in the inferior right lobe of the liver (53/2) also new since the prior CT and consistent with metastatic disease. There is a 2.4 x 1.7 cm hypodense lesion along the superior capsule of the left lobe of the liver (37/2 and coronal 32/4) consistent with metastatic implant. Slight thickened and nodular appearance of the right hemidiaphragm over the right lobe of the liver concerning for peritoneal metastasis. No intrahepatic biliary dilatation. Cholecystectomy. No retained calcified stone noted in the central CBD.  Pancreas: Unremarkable. No pancreatic ductal dilatation or surrounding inflammatory changes. Spleen: Normal in size without focal abnormality. Adrenals/Urinary Tract: The adrenal glands unremarkable. There is no hydronephrosis or nephrolithiasis on either side. A 2 cm right renal inferior pole cyst and additional right renal subcentimeter hypodensities which are not characterized. The visualized ureters appear unremarkable. The urinary bladder is collapsed. Stomach/Bowel: There is sigmoid diverticulosis without active inflammatory changes. There is no bowel obstruction or active inflammation. Normal appendix. Vascular/Lymphatic: Advanced aortoiliac atherosclerotic disease. The IVC is unremarkable. No portal venous gas. No retroperitoneal adenopathy. Reproductive: Hysterectomy. No adnexal masses. Other: There has been interval increase in the size and number of omental implants in the right lower abdomen compared to prior CT. The largest implant measures approximately 3.5 x 3.1  cm (previously 3.6 x 2.7 cm. A 12 x 15 mm omental implant in the midline appears new since the prior CT. Several additional new omental implants in the anterior pelvis. There is thickened and nodular appearance of the left hemidiaphragm with a 3.0 x 1.4 cm hypodense lesion (coronal 67/4) consistent with metastatic disease. This is new since the prior CT. Musculoskeletal: Degenerative changes of the spine. Bilateral sacral linear sclerotic changes likely sequela of prior insufficiency fractures and similar to prior CT. No acute osseous pathology. IMPRESSION: 1. Interval increase in the size and number of omental implants in the right lower abdomen compared to the prior CT. Bilateral subdiaphragmatic peritoneal implants, new since the prior CT. 2. New hepatic metastatic disease. 3. No evidence of metastatic disease in the chest. 4. Sigmoid diverticulosis. 5. Aortic Atherosclerosis (ICD10-I70.0). Electronically Signed   By: Anner Crete M.D.   On: 05/29/2020 21:02     Assessment and plan- Patient is a 69 y.o. female with recurrent endometrial cancer with peritoneal carcinomatosis here to discuss further goals of care  Patient has platinum refractory disease and second line Doxil could not be attempted because of baseline ejection fraction of 35%.  She was switched to carboplatin and gemcitabine which she could not tolerate due to significant cytopenias and hospitalization.  Patient was switched to third line lenvatinib plus Keytruda.  Patient has not been able to tolerate lenvatinib for the most part and gets short of breath and hospitalized Even with the lowest doses of Lenvima.    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  She is not a further candidate for systemic chemotherapy due to poor  tolerance in the past.  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  However, I do not feel the patient can tolerate mtor inhibitors such as everolimus without experiencing severe side effects and their overall response rate is modest at best especially given that she has high-grade serous histology.  For these reasons I would recommend proceeding with best supportive care.  I will touch base with NP Altha Harm to see if home palliative care can be an option for her.  She is not quite ready to proceed with hospice yet.  She has ongoing cardiac issues and feels the need to pursue hospitalization if need be.  I will see her back in 1 month's time for routine follow-up.  No labs   Visit Diagnosis 1. Goals of care, counseling/discussion   2. Peritoneal carcinomatosis ([place])   3.  Endometrial adenocarcinoma ([place])      Dr. Randa Evens, MD, MPH [facility] [phone] 06/10/2020 7:22 AM

## 2020-06-11 ENCOUNTER — Telehealth: Payer: Self-pay | Admitting: *Deleted

## 2020-06-11 ENCOUNTER — Telehealth: Payer: Self-pay

## 2020-06-11 ENCOUNTER — Other Ambulatory Visit: Payer: Self-pay | Admitting: *Deleted

## 2020-06-11 NOTE — Telephone Encounter (Signed)
Pt would like for Dr. Darrick Huntsman to call her in some cough medicine or suggestions on what she can take over the counter. She said her cough has no phlegm.

## 2020-06-11 NOTE — Telephone Encounter (Signed)
Patient called reporting that she is coughing a lot and that it is in her throat. She is requesting a cough medicine be sent in for her. She also sounds nasal congested on phone

## 2020-06-12 MED ORDER — HYDROCODONE-HOMATROPINE 5-1.5 MG/5ML PO SYRP: 5.0000 mL | ORAL_SOLUTION | Freq: Four times a day (QID) | ORAL | 0 refills | Status: DC | PRN

## 2020-06-12 NOTE — Telephone Encounter (Signed)
Called pt and let her know that dr Smith Robert has sent in rx for cough med. She will go and pick it up.

## 2020-06-12 NOTE — Telephone Encounter (Signed)
Prescription sent in by Dr Smith Robert. Patient informed

## 2020-06-12 NOTE — Telephone Encounter (Signed)
Spoke with pt and she stated that Dr. Smith Robert sent her in a cough medication.

## 2020-06-17 DIAGNOSIS — J9601 Acute respiratory failure with hypoxia: Secondary | ICD-10-CM | POA: Diagnosis not present

## 2020-06-17 DIAGNOSIS — I509 Heart failure, unspecified: Secondary | ICD-10-CM | POA: Diagnosis not present

## 2020-06-23 DIAGNOSIS — I509 Heart failure, unspecified: Secondary | ICD-10-CM | POA: Diagnosis not present

## 2020-06-23 DIAGNOSIS — J9601 Acute respiratory failure with hypoxia: Secondary | ICD-10-CM | POA: Diagnosis not present

## 2020-06-25 ENCOUNTER — Ambulatory Visit (INDEPENDENT_AMBULATORY_CARE_PROVIDER_SITE_OTHER): Payer: PPO

## 2020-06-25 VITALS — Ht 63.0 in | Wt 149.0 lb

## 2020-06-25 DIAGNOSIS — Z0000 Encounter for general adult medical examination without abnormal findings: Secondary | ICD-10-CM | POA: Diagnosis not present

## 2020-06-25 NOTE — Patient Instructions (Addendum)
Jaime Hall , Thank you for taking time to come for your Medicare Wellness Visit. I appreciate your ongoing commitment to your health goals. Please review the following plan we discussed and let me know if I can assist you in the future.   These are the goals we discussed: Goals      Patient Stated   .  Maintain Healthy Lifestyle (pt-stated)      Stay hydrated Eat a healthy diet       This is a list of the screening recommended for you and due dates:  Health Maintenance  Topic Date Due  . Colon Cancer Screening  02/05/2020  . COVID-19 Vaccine (3 - Pfizer risk 4-dose series) 07/11/2020*  . Flu Shot  09/12/2020*  . Tetanus Vaccine  10/04/2021  . Mammogram  01/04/2022  . DEXA scan (bone density measurement)  Completed  .  Hepatitis C: One time screening is recommended by Center for Disease Control  (CDC) for  adults born from [birth year] through 1966.   Completed  . Pneumonia vaccines  Completed  *Topic was postponed. The date shown is not the original due date.    Immunizations Immunization History  Administered Date(s) Administered  . PFIZER SARS-COV-2 Vaccination 08/20/2019, 09/12/2019  . Pneumococcal Conjugate-13 04/29/2015  . Pneumococcal Polysaccharide-23 04/02/2020  . Tdap 10/05/2011   Advanced directives: on file  Conditions/risks identified: none new  Follow up in one year for your annual wellness visit    Preventive Care 65 Years and Older, Female Preventive care refers to lifestyle choices and visits with your health care provider that can promote health and wellness. What does preventive care include?  A yearly physical exam. This is also called an annual well check.  Dental exams once or twice a year.  Routine eye exams. Ask your health care provider how often you should have your eyes checked.  Personal lifestyle choices, including:  Daily care of your teeth and gums.  Regular physical activity.  Eating a healthy diet.  Avoiding tobacco and drug  use.  Limiting alcohol use.  Practicing safe sex.  Taking low-dose aspirin every day.  Taking vitamin and mineral supplements as recommended by your health care provider. What happens during an annual well check? The services and screenings done by your health care provider during your annual well check will depend on your age, overall health, lifestyle risk factors, and family history of disease. Counseling  Your health care provider may ask you questions about your:  Alcohol use.  Tobacco use.  Drug use.  Emotional well-being.  Home and relationship well-being.  Sexual activity.  Eating habits.  History of falls.  Memory and ability to understand (cognition).  Work and work Statistician.  Reproductive health. Screening  You may have the following tests or measurements:  Height, weight, and BMI.  Blood pressure.  Lipid and cholesterol levels. These may be checked every 5 years, or more frequently if you are over 64 years old.  Skin check.  Lung cancer screening. You may have this screening every year starting at age 35 if you have a 30-pack-year history of smoking and currently smoke or have quit within the past 15 years.  Fecal occult blood test (FOBT) of the stool. You may have this test every year starting at age 16.  Flexible sigmoidoscopy or colonoscopy. You may have a sigmoidoscopy every 5 years or a colonoscopy every 10 years starting at age 75.  Hepatitis C blood test.  Hepatitis B blood test.  Sexually transmitted  disease (STD) testing.  Diabetes screening. This is done by checking your blood sugar (glucose) after you have not eaten for a while (fasting). You may have this done every 1-3 years.  Bone density scan. This is done to screen for osteoporosis. You may have this done starting at age 45.  Mammogram. This may be done every 1-2 years. Talk to your health care provider about how often you should have regular mammograms. Talk with your  health care provider about your test results, treatment options, and if necessary, the need for more tests. Vaccines  Your health care provider may recommend certain vaccines, such as:  Influenza vaccine. This is recommended every year.  Tetanus, diphtheria, and acellular pertussis (Tdap, Td) vaccine. You may need a Td booster every 10 years.  Zoster vaccine. You may need this after age 70.  Pneumococcal 13-valent conjugate (PCV13) vaccine. One dose is recommended after age 25.  Pneumococcal polysaccharide (PPSV23) vaccine. One dose is recommended after age 73. Talk to your health care provider about which screenings and vaccines you need and how often you need them. This information is not intended to replace advice given to you by your health care provider. Make sure you discuss any questions you have with your health care provider. Document Released: 06/28/2015 Document Revised: 02/19/2016 Document Reviewed: 04/02/2015 Elsevier Interactive Patient Education  2017 [place] Prevention in the Home Falls can cause injuries. They can happen to people of all ages. There are many things you can do to make your home safe and to help prevent falls. What can I do on the outside of my home?  Regularly fix the edges of walkways and driveways and fix any cracks.  Remove anything that might make you trip as you walk through a door, such as a raised step or threshold.  Trim any bushes or trees on the path to your home.  Use bright outdoor lighting.  Clear any walking paths of anything that might make someone trip, such as rocks or tools.  Regularly check to see if handrails are loose or broken. Make sure that both sides of any steps have handrails.  Any raised decks and porches should have guardrails on the edges.  Have any leaves, snow, or ice cleared regularly.  Use sand or salt on walking paths during winter.  Clean up any spills in your garage right away. This includes oil  or grease spills. What can I do in the bathroom?  Use night lights.  Install grab bars by the toilet and in the tub and shower. Do not use towel bars as grab bars.  Use non-skid mats or decals in the tub or shower.  If you need to sit down in the shower, use a plastic, non-slip stool.  Keep the floor dry. Clean up any water that spills on the floor as soon as it happens.  Remove soap buildup in the tub or shower regularly.  Attach bath mats securely with double-sided non-slip rug tape.  Do not have throw rugs and other things on the floor that can make you trip. What can I do in the bedroom?  Use night lights.  Make sure that you have a light by your bed that is easy to reach.  Do not use any sheets or blankets that are too big for your bed. They should not hang down onto the floor.  Have a firm chair that has side arms. You can use this for support while you get dressed.  Do not have throw rugs and other things on the floor that can make you trip. What can I do in the kitchen?  Clean up any spills right away.  Avoid walking on wet floors.  Keep items that you use a lot in easy-to-reach places.  If you need to reach something above you, use a strong step stool that has a grab bar.  Keep electrical cords out of the way.  Do not use floor polish or wax that makes floors slippery. If you must use wax, use non-skid floor wax.  Do not have throw rugs and other things on the floor that can make you trip. What can I do with my stairs?  Do not leave any items on the stairs.  Make sure that there are handrails on both sides of the stairs and use them. Fix handrails that are broken or loose. Make sure that handrails are as long as the stairways.  Check any carpeting to make sure that it is firmly attached to the stairs. Fix any carpet that is loose or worn.  Avoid having throw rugs at the top or bottom of the stairs. If you do have throw rugs, attach them to the floor with  carpet tape.  Make sure that you have a light switch at the top of the stairs and the bottom of the stairs. If you do not have them, ask someone to add them for you. What else can I do to help prevent falls?  Wear shoes that:  Do not have high heels.  Have rubber bottoms.  Are comfortable and fit you well.  Are closed at the toe. Do not wear sandals.  If you use a stepladder:  Make sure that it is fully opened. Do not climb a closed stepladder.  Make sure that both sides of the stepladder are locked into place.  Ask someone to hold it for you, if possible.  Clearly mark and make sure that you can see:  Any grab bars or handrails.  First and last steps.  Where the edge of each step is.  Use tools that help you move around (mobility aids) if they are needed. These include:  Canes.  Walkers.  Scooters.  Crutches.  Turn on the lights when you go into a dark area. Replace any light bulbs as soon as they burn out.  Set up your furniture so you have a clear path. Avoid moving your furniture around.  If any of your floors are uneven, fix them.  If there are any pets around you, be aware of where they are.  Review your medicines with your doctor. Some medicines can make you feel dizzy. This can increase your chance of falling. Ask your doctor what other things that you can do to help prevent falls. This information is not intended to replace advice given to you by your health care provider. Make sure you discuss any questions you have with your health care provider. Document Released: 03/28/2009 Document Revised: 11/07/2015 Document Reviewed: 07/06/2014 Elsevier Interactive Patient Education  2017 [employer].

## 2020-06-25 NOTE — Progress Notes (Signed)
Subjective:   JOSSILYN STUBBS is a 70 y.o. female who presents for Medicare Annual (Subsequent) preventive examination.  Review of Systems    No ROS.  Medicare Wellness Virtual Visit.    Cardiac Risk Factors include: advanced age (>38men, >56 women);hypertension     Objective:    Today's Vitals   06/25/20 1131  Weight: 149 lb (67.6 kg)  Height: 5\' 3"  (1.6 m)   Body mass index is 26.39 kg/m.  Advanced Directives 06/25/2020 06/10/2020 05/28/2020 05/28/2020 05/20/2020 05/19/2020 05/15/2020  Does Patient Have a Medical Advance Directive? Yes Yes Yes Yes - No Yes  Type of Paramedic of [place];Living will - - - - Special educational needs teacher of [place];Living will  Does patient want to make changes to medical advance directive? No - Patient declined - - - - Yes (ED - Information included in AVS) Yes (ED - Information included in AVS)  Copy of [place] in Chart? Yes - validated most recent copy scanned in chart (See row information) - - - - No - copy requested -  Would patient like information on creating a medical advance directive? - - - - No - Patient declined - -    Current Medications (verified) Outpatient Encounter Medications as of 06/25/2020  Medication Sig  . amitriptyline (ELAVIL) 25 MG tablet Take 1 tablet (25 mg total) by mouth at bedtime. May increase weekly as needed  . apixaban (ELIQUIS) 2.5 MG TABS tablet Take 1 tablet (2.5 mg total) by mouth 2 (two) times daily.  Marland Kitchen atorvastatin (LIPITOR) 10 MG tablet Take 1 tablet by mouth once daily  . cholecalciferol (VITAMIN D3) 25 MCG (1000 UNIT) tablet Take 1,000 Units by mouth in the morning and at bedtime.  . cyanocobalamin (,VITAMIN B-12,) 1000 MCG/ML injection INJECT 1ML IM WEEKLY FOR 4 WEEKS, THEN INJECT MONTHLY THEREAFTER (Patient taking differently: Inject 1,000 mcg into the skin every 30 (thirty) days.)  . empagliflozin (JARDIANCE) 10 MG TABS tablet Take 1  tablet (10 mg total) by mouth daily.  . furosemide (LASIX) 20 MG tablet Take 1 tablet (20 mg total) by mouth as needed for fluid or edema.  Marland Kitchen HYDROcodone-homatropine (HYCODAN) 5-1.5 MG/5ML syrup Take 5 mLs by mouth every 6 (six) hours as needed for cough.  . levothyroxine (SYNTHROID) 75 MCG tablet Take 1 tablet (75 mcg total) by mouth daily before breakfast.  . LORazepam (ATIVAN) 0.5 MG tablet Take 1 tablet (0.5 mg total) by mouth every 6 (six) hours as needed for anxiety (and to help with breathing).  . metoprolol succinate (TOPROL XL) 25 MG 24 hr tablet Take 1 tablet (25 mg total) by mouth daily.  . Morphine Sulfate (MORPHINE CONCENTRATE) 10 mg / 0.5 ml concentrated solution Take 0.25 mLs (5 mg total) by mouth every 6 (six) hours as needed for severe pain or shortness of breath. (Patient not taking: Reported on 06/10/2020)  . pantoprazole (PROTONIX) 20 MG tablet Take 1 tablet (20 mg total) by mouth daily.  . potassium chloride (KLOR-CON) 10 MEQ tablet Take 1 tablet (10 mEq total) by mouth daily as needed (Take on days that you take the Lasix.).  . [DISCONTINUED] prochlorperazine (COMPAZINE) 10 MG tablet Take 1 tablet (10 mg total) by mouth every 6 (six) hours as needed (Nausea or vomiting). (Patient taking differently: Take 10 mg by mouth daily as needed for nausea or vomiting. )   Facility-Administered Encounter Medications as of 06/25/2020  Medication  . heparin lock flush  100 unit/mL  . sodium chloride flush (NS) 0.9 % injection 10 mL  . sodium chloride flush (NS) 0.9 % injection 10 mL    Allergies (verified) Adhesive [tape], Antifungal [miconazole nitrate], Sulfa antibiotics, and Z-pak [azithromycin]   History: Past Medical History:  Diagnosis Date  . Anxiety   . Cervicalgia   . CHF (congestive heart failure) ([place])   . Chronic kidney disease   . Coronary artery disease    a. 02/2012 Stress echo: severe anterior wall ischemia;  b. 02/2012 Cath/PCI: LAD 95p (3.0 x 15 Xience EX DES), D1  90ost (PTCA - bifurcational dzs), EF 45% with anterior HK;  b. 02/2013 Ex MV: fixed anterior defect w/ minor reversibility, nl EF-->Med Rx.  . Endometrial cancer ([place])    a. 07/2016 s/p robotic hysterectomy, BSO w/ washings, sentinel node inj, mapping, bx, adhesiolysis.  . Essential hypertension, benign   . Fibrocystic breast disease   . GERD (gastroesophageal reflux disease)   . Gestational hypertension   . Heart murmur   . History of anemia   . History of blood transfusion   . Hodgkin's lymphoma ([place]) 2011   a. s/p radiation and chemo therapy  . Osteoarthritis   . Polycystic ovarian disease    Past Surgical History:  Procedure Laterality Date  . ABDOMINAL HYSTERECTOMY    . bladder sling    . CARDIAC CATHETERIZATION  02/2012   [facility] 1 stent place  . CERVICAL POLYPECTOMY    . CHOLECYSTECTOMY  1982  . COLONOSCOPY WITH PROPOFOL N/A 02/05/2015   Procedure: COLONOSCOPY WITH PROPOFOL;  Surgeon: Lucilla Lame, MD;  Location: [facility] ENDOSCOPY;  Service: Endoscopy;  Laterality: N/A;  . CORONARY ANGIOPLASTY  02/2012   left/right s/p balloon  . CYSTOGRAM N/A 08/17/2016   Procedure: CYSTOGRAM;  Surgeon: Hollice Espy, MD;  Location: [facility] ORS;  Service: Urology;  Laterality: N/A;  . CYSTOSCOPY N/A 08/17/2016   Procedure: CYSTOSCOPY EXAM UNDER ANESTHESIA;  Surgeon: Hollice Espy, MD;  Location: [facility] ORS;  Service: Urology;  Laterality: N/A;  . CYSTOSCOPY W/ RETROGRADES Bilateral 08/17/2016   Procedure: CYSTOSCOPY WITH RETROGRADE PYELOGRAM;  Surgeon: Hollice Espy, MD;  Location: [facility] ORS;  Service: Urology;  Laterality: Bilateral;  . CYSTOSCOPY WITH STENT PLACEMENT Right 08/17/2016   Procedure: CYSTOSCOPY WITH STENT PLACEMENT;  Surgeon: Hollice Espy, MD;  Location: [facility] ORS;  Service: Urology;  Laterality: Right;  . heart stent'  2013  . kidney stent Right 2018  . LYMPH NODE BIOPSY  2011   diagnosis of hodgkins lymphoma  . PELVIC LYMPH NODE DISSECTION N/A 07/29/2016   Procedure: PELVIC/AORTIC LYMPH NODE  SAMPLING;  Surgeon: [name], MD;  Location: [facility] ORS;  Service: Gynecology;  Laterality: N/A;  . PORTA CATH INSERTION N/A 09/22/2016   Procedure: Glori Luis Cath Insertion;  Surgeon: [name], MD;  Location: [place] [facility];  Service: Cardiovascular;  Laterality: N/A;  . PORTA CATH INSERTION N/A 10/27/2019   Procedure: PORTA CATH INSERTION;  Surgeon: [name], MD;  Location: [place] [facility];  Service: Cardiovascular;  Laterality: N/A;  . PORTA CATH REMOVAL N/A 11/17/2016   Procedure: Glori Luis Cath Removal;  Surgeon: [name], MD;  Location: [place] [facility];  Service: Cardiovascular;  Laterality: N/A;  . RIGHT/LEFT HEART CATH AND CORONARY ANGIOGRAPHY N/A 12/04/2019   Procedure: RIGHT/LEFT HEART CATH AND CORONARY ANGIOGRAPHY;  Surgeon: Wellington Hampshire, MD;  Location: [place] [facility];  Service: Cardiovascular;  Laterality: N/A;  . ROBOTIC ASSISTED TOTAL HYSTERECTOMY WITH BILATERAL  SALPINGO OOPHERECTOMY N/A 07/29/2016   Procedure: ROBOTIC ASSISTED TOTAL HYSTERECTOMY WITH BILATERAL SALPINGO OOPHORECTOMY;  Surgeon: [name], MD;  Location: [facility];  Service: Gynecology;  Laterality: N/A;  . SENTINEL NODE BIOPSY N/A 07/29/2016   Procedure: SENTINEL NODE BIOPSY;  Surgeon: [name], MD;  Location: [facility];  Service: Gynecology;  Laterality: N/A;  . transobturator sling N/A 2009   [place]   Family History  Problem Relation Age of Onset  . ALS Father   . Polymyositis Father   . Diabetes Brother   . Dementia Mother   . Cancer Maternal Aunt        breast  . Breast cancer Maternal Aunt        30's  . Stroke Maternal Grandmother   . Cancer Maternal Grandfather        prostate  . Stroke Maternal Grandfather   . Colon cancer Maternal Aunt   . Non-Hodgkin's lymphoma Cousin    Social History   Socioeconomic History  . Marital status: Widowed    Spouse name: Not on file  . Number of children: Not on file  . Years  of education: Not on file  . Highest education level: Not on file  Occupational History  . Not on file  Tobacco Use  . Smoking status: Former Smoker    Packs/day: 1.00    Years: 30.00    Pack years: 30.00    Types: Cigarettes    Quit date: 07/24/2002    Years since quitting: 17.9  . Smokeless tobacco: Never Used  . Tobacco comment: quit smoking in 2000  Vaping Use  . Vaping Use: Never used  Substance and Sexual Activity  . Alcohol use: Not Currently  . Drug use: No  . Sexual activity: Never  Other Topics Concern  . 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    Social Determinants of Health   Financial Resource Strain: Low Risk   . Difficulty of Paying Living Expenses: Not hard at all  Food Insecurity: No Food Insecurity  . Worried About Charity fundraiser in the Last Year: Never true  . Ran Out of Food in the Last Year: Never true  Transportation Needs: No Transportation Needs  . Lack of Transportation (Medical): No  . Lack of Transportation (Non-Medical): No  Physical Activity: Unknown  . Days of Exercise per Week: 0 days  . Minutes of Exercise per Session: Not on file  Stress: No Stress Concern Present  . Feeling of Stress : Only a little  Social Connections: Unknown  . Frequency of Communication with Friends and Family: More than three times a week  . Frequency of Social Gatherings with Friends and Family: More than three times a week  . Attends Religious Services: Not on file  . Active Member of Clubs or Organizations: Not on file  . Attends Archivist Meetings: Not on file  . Marital Status: Not on file    Tobacco Counseling Counseling given: Not Answered Comment: quit smoking in 2000   Clinical Intake:  Pre-visit preparation completed: Yes        Diabetes: No  How often do you need to have someone help you when you read instructions, pamphlets, or other  written materials from your doctor or pharmacy?: 1 - Never    Interpreter Needed?: No      Activities of Daily Living  In your present state of health, do you have any difficulty performing the following activities: 06/25/2020 05/20/2020  Hearing? N N  Vision? N Y  Difficulty concentrating or making decisions? N N  Walking or climbing stairs? N N  Dressing or bathing? N N  Doing errands, shopping? N N  Preparing Food and eating ? N -  Using the Toilet? N -  In the past six months, have you accidently leaked urine? N -  Do you have problems with loss of bowel control? N -  Managing your Medications? N -  Managing your Finances? N -  Housekeeping or managing your Housekeeping? N -  Some recent data might be hidden    Patient Care Team: [name], MD as PCP - General (Internal Medicine) Wellington Hampshire, MD as PCP - Cardiology (Cardiology) [name], MD as Consulting Physician (General Surgery) Mellody Drown, MD as Referring Physician (Obstetrics and Gynecology) [name], MD as Referring Physician (Obstetrics and Gynecology) Hollice Espy, MD as Consulting Physician (Urology) [name], RN as Registered Nurse Sindy Guadeloupe, MD as Consulting Physician (Oncology)  Indicate any recent Medical Services you may have received from other than [facility] providers in the past year (date may be approximate).     Assessment:   This is a routine wellness examination for Evianna.  I connected with Daylah today by telephone and verified that I am speaking with the correct person using two identifiers. Location patient: home Location provider: work Persons participating in the virtual visit: patient, Marine scientist.    I discussed the limitations, risks, security and privacy concerns of performing an evaluation and management service by telephone and the availability of in person appointments. The patient expressed understanding and verbally consented to  this telephonic visit.    Interactive audio and video telecommunications were attempted between this provider and patient, however failed, due to patient having technical difficulties OR patient did not have access to video capability.  We continued and completed visit with audio only.  Some vital signs may be absent or patient reported.   Hearing/Vision screen  Hearing Screening   125Hz  250Hz  500Hz  1000Hz  2000Hz  3000Hz  4000Hz  6000Hz  8000Hz   Right ear:           Left ear:           Comments: Patient is able to hear conversational tones without difficulty.  No issues reported.  Vision Screening Comments: Followed by [facility]  Wears corrective lenses  Visual acuity not assessed per patient preference since they have regular follow up with the ophthalmologist  Dietary issues and exercise activities discussed: Current Exercise Habits: The patient does not participate in regular exercise at present  Heart healthy diet Good water intake  Goals      Patient Stated   .  Maintain Healthy Lifestyle (pt-stated)      Stay hydrated Eat a healthy diet      Depression Screen PHQ 2/9 Scores 06/25/2020 04/02/2020 04/02/2020 11/10/2017 08/19/2017 02/22/2017 11/06/2016  PHQ - 2 Score 1 1 1  0 0 0 0  PHQ- 9 Score - 7 - - - - 0    Fall Risk Fall Risk  06/25/2020 04/02/2020 02/29/2020 02/01/2020 10/02/2019  Falls in the past year? 0 0 0 0 0  Comment - - - - -  Number falls in past yr: 0 - - 0 -  Injury with Fall? 0 - - 0 -  Follow up Falls evaluation completed Falls evaluation completed Falls  evaluation completed Falls evaluation completed Falls evaluation completed    FALL RISK PREVENTION PERTAINING TO THE HOME: Handrails in use when climbing stairs? Yes Home free of loose throw rugs in walkways, pet beds, electrical cords, etc? Yes  Adequate lighting in your home to reduce risk of falls? Yes   ASSISTIVE DEVICES UTILIZED TO PREVENT FALLS: Life alert? No   Use of a cane, walker or  w/c? No  Grab bars in the bathroom? No  Shower chair or bench in shower? No  Elevated toilet seat? Yes   TIMED UP AND GO: Was the test performed? No . Virtual visit.  Cognitive Function: MMSE - Mini Mental State Exam 11/10/2017 11/06/2016 11/07/2015  Orientation to time 5 5 5   Orientation to Place 5 5 5   Registration 3 3 3   Attention/ Calculation 5 5 5   Recall 3 3 3   Language- name 2 objects 2 2 2   Language- repeat 1 1 1   Language- follow 3 step command 3 3 3   Language- read & follow direction 1 1 1   Write a sentence 1 1 1   Copy design 1 1 1   Total score 30 30 30      6CIT Screen 06/25/2020 11/14/2018  What Year? 0 points 0 points  What month? 0 points 0 points  What time? 0 points 0 points  Count back from 20 0 points 0 points  Months in reverse 0 points 0 points  Repeat phrase 0 points 0 points  Total Score 0 0    Immunizations Immunization History  Administered Date(s) Administered  . PFIZER SARS-COV-2 Vaccination 08/20/2019, 09/12/2019  . Pneumococcal Conjugate-13 04/29/2015  . Pneumococcal Polysaccharide-23 04/02/2020  . Tdap 10/05/2011   Health Maintenance Health Maintenance  Topic Date Due  . COLONOSCOPY (Pts 45-24yrs Insurance coverage will need to be confirmed)  02/05/2020  . COVID-19 Vaccine (3 - Pfizer risk 4-dose series) 07/11/2020 (Originally 10/10/2019)  . INFLUENZA VACCINE  09/12/2020 (Originally 01/14/2020)  . TETANUS/TDAP  10/04/2021  . MAMMOGRAM  01/04/2022  . DEXA SCAN  Completed  . Hepatitis C Screening  Completed  . PNA vac Low Risk Adult  Completed   Colorectal cancer screening: Type of screening: Colonoscopy. Completed 02/05/15. Repeat every 5 years  Mammogram status: Completed 01/05/20. Repeat every year  Bone Density status: Completed 06/26/15. Results reflect: Bone density results: NORMAL. Repeat every 5 years.   Lung Cancer Screening: (Low Dose CT Chest recommended if Age 47-80 years, 30 pack-year currently smoking OR have quit w/in 15years.)  does not qualify.   Hepatitis C Screening: Completed 10/24/14.   Vision Screening: Recommended annual ophthalmology exams for early detection of glaucoma and other disorders of the eye. Is the patient up to date with their annual eye exam?  Yes  Who is the provider or what is the name of the office in which the patient attends annual eye exams? Dr. Marvel Plan.   Dental Screening: Recommended annual dental exams for proper oral hygiene.  Community Resource Referral / Chronic Care Management: CRR required this visit?  No   CCM required this visit?  No      Plan:   Keep all routine maintenance appointments.   I have personally reviewed and noted the following in the patient's chart:   . Medical and social history . Use of alcohol, tobacco or illicit drugs  . Current medications and supplements . Functional ability and status . Nutritional status . Physical activity . Advanced directives . List of other physicians . Hospitalizations, surgeries, and ER  visits in previous 12 months . Vitals . Screenings to include cognitive, depression, and falls . Referrals and appointments  In addition, I have reviewed and discussed with patient certain preventive protocols, quality metrics, and best practice recommendations. A written personalized care plan for preventive services as well as general preventive health recommendations were provided to patient via mychart.     Varney Biles, LPN   [id number]

## 2020-06-26 ENCOUNTER — Ambulatory Visit: Payer: PPO

## 2020-06-26 ENCOUNTER — Encounter: Payer: Self-pay | Admitting: Family

## 2020-06-26 DIAGNOSIS — I428 Other cardiomyopathies: Secondary | ICD-10-CM

## 2020-06-26 DIAGNOSIS — I5020 Unspecified systolic (congestive) heart failure: Secondary | ICD-10-CM

## 2020-06-26 NOTE — Progress Notes (Signed)
..  The following Assist/Replace Program for [facility] from [employer] has been terminated due to Progression in disease per Dr. Janese Banks no longer candidate for Keytruda.  Last DOS:05/17/2020

## 2020-06-28 MED ORDER — METOPROLOL SUCCINATE ER 25 MG PO TB24: 12.5000 mg | ORAL_TABLET | Freq: Every day | ORAL | 5 refills | Status: DC

## 2020-06-28 NOTE — Telephone Encounter (Signed)
Called and spoke to pt regarding recc and med changes. Pt verbalized understanding.  Pt thinks she is unable to cut her pills in half, so new Rx sent to pt's pharmacy for new dose: Metoprolol Succinate 12.5mg  daily. Pt will monitor HR while on new dose for tachycardia and will contact our office with concerns.  Otherwise, pt will bring log of BP/HR and daily weights to next f/u appt on 07/09/20.

## 2020-06-29 DIAGNOSIS — J9601 Acute respiratory failure with hypoxia: Secondary | ICD-10-CM | POA: Diagnosis not present

## 2020-06-29 DIAGNOSIS — I509 Heart failure, unspecified: Secondary | ICD-10-CM | POA: Diagnosis not present

## 2020-07-02 NOTE — Progress Notes (Signed)
Office Visit    Patient Name: Jaime Hall Date of Encounter: 07/09/2020  Primary Care Provider:  Sherlene Shams, MD Primary Cardiologist:  Jaime Bears, MD Electrophysiologist:  None   Chief Complaint    Jaime Hall is a 70 y.o. female with a hx of CAD, Hodgkin's lymphoma, HFrEF, uterine cancer, PE, PSVT  presents today for follow up of HFrEF and PSVT.  Past Medical History    Past Medical History:  Diagnosis Date  . Anxiety   . Cervicalgia   . CHF (congestive heart failure) (HCC)   . Chronic kidney disease   . Coronary artery disease    a. 02/2012 Stress echo: severe anterior wall ischemia;  b. 02/2012 Cath/PCI: LAD 95p (3.0 x 15 Xience EX DES), D1 90ost (PTCA - bifurcational dzs), EF 45% with anterior HK;  b. 02/2013 Ex MV: fixed anterior defect w/ minor reversibility, nl EF-->Med Rx.  . Endometrial cancer (HCC)    a. 07/2016 s/p robotic hysterectomy, BSO w/ washings, sentinel node inj, mapping, bx, adhesiolysis.  . Essential hypertension, benign   . Fibrocystic breast disease   . GERD (gastroesophageal reflux disease)   . Gestational hypertension   . Heart murmur   . History of anemia   . History of blood transfusion   . Hodgkin's lymphoma (HCC) 2011   a. s/p radiation and chemo therapy  . Osteoarthritis   . Polycystic ovarian disease    Past Surgical History:  Procedure Laterality Date  . ABDOMINAL HYSTERECTOMY    . bladder sling    . CARDIAC CATHETERIZATION  02/2012   [facility] 1 stent place  . CERVICAL POLYPECTOMY    . CHOLECYSTECTOMY  1982  . COLONOSCOPY WITH PROPOFOL N/A 02/05/2015   Procedure: COLONOSCOPY WITH PROPOFOL;  Surgeon: Jaime Minium, MD;  Location: [facility];  Service: Endoscopy;  Laterality: N/A;  . CORONARY ANGIOPLASTY  02/2012   left/right s/p balloon  . CYSTOGRAM N/A 08/17/2016   Procedure: CYSTOGRAM;  Surgeon: Jaime Scotland, MD;  Location: [facility];  Service: Urology;  Laterality: N/A;  . CYSTOSCOPY N/A 08/17/2016   Procedure:  CYSTOSCOPY EXAM UNDER ANESTHESIA;  Surgeon: Jaime Scotland, MD;  Location: [facility];  Service: Urology;  Laterality: N/A;  . CYSTOSCOPY W/ RETROGRADES Bilateral 08/17/2016   Procedure: CYSTOSCOPY WITH RETROGRADE PYELOGRAM;  Surgeon: Jaime Scotland, MD;  Location: [facility];  Service: Urology;  Laterality: Bilateral;  . CYSTOSCOPY WITH STENT PLACEMENT Right 08/17/2016   Procedure: CYSTOSCOPY WITH STENT PLACEMENT;  Surgeon: Jaime Scotland, MD;  Location: [facility];  Service: Urology;  Laterality: Right;  . heart stent'  2013  . kidney stent Right 2018  . LYMPH NODE BIOPSY  2011   diagnosis of hodgkins lymphoma  . PELVIC LYMPH NODE DISSECTION N/A 07/29/2016   Procedure: PELVIC/AORTIC LYMPH NODE SAMPLING;  Surgeon: Jaime Laroche, MD;  Location: [facility];  Service: Gynecology;  Laterality: N/A;  . PORTA CATH Hall N/A 09/22/2016   Procedure: Jaime Hall;  Surgeon: Jaime Dills, MD;  Location: [facility];  Service: Cardiovascular;  Laterality: N/A;  . PORTA CATH Hall N/A 10/27/2019   Procedure: PORTA CATH Hall;  Surgeon: Jaime Dills, MD;  Location: [facility];  Service: Cardiovascular;  Laterality: N/A;  . PORTA CATH REMOVAL N/A 11/17/2016   Procedure: Jaime Pal Cath Removal;  Surgeon: Jaime Dills, MD;  Location: [facility];  Service: Cardiovascular;  Laterality: N/A;  . RIGHT/LEFT HEART CATH AND CORONARY ANGIOGRAPHY N/A 12/04/2019  Procedure: RIGHT/LEFT HEART CATH AND CORONARY ANGIOGRAPHY;  Surgeon: Jaime Hampshire, MD;  Location: [facility];  Service: Cardiovascular;  Laterality: N/A;  . ROBOTIC ASSISTED TOTAL HYSTERECTOMY WITH BILATERAL SALPINGO OOPHERECTOMY N/A 07/29/2016   Procedure: ROBOTIC ASSISTED TOTAL HYSTERECTOMY WITH BILATERAL SALPINGO OOPHORECTOMY;  Surgeon: Jaime Ends, MD;  Location: [facility];  Service: Gynecology;  Laterality: N/A;  . SENTINEL NODE BIOPSY N/A 07/29/2016   Procedure: SENTINEL NODE  BIOPSY;  Surgeon: Jaime Ends, MD;  Location: [facility];  Service: Gynecology;  Laterality: N/A;  . transobturator sling N/A 2009   [place]    Allergies  Allergies  Allergen Reactions  . Metoprolol Other (See Comments)    Fatigue  . Adhesive [Tape] Other (See Comments)    Skin Irritation   . Antifungal [Miconazole Nitrate] Rash  . Sulfa Antibiotics Nausea And Vomiting and Rash  . Z-Pak [Azithromycin] Itching    History of Present Illness    Jaime Hall is a 70 y.o. female with a hx of CAD (prior stent to LAD), Hodgkin's lymphoma, HFrEF, uterine and endometrial cancer, PE last seen 05/31/20.  History of Hodgkin's lymphoma treated with radiation and chemotherapy to the chest area previously in remission since 2011.  Uterine cancer in 2018 treated with surgery and chemotherapy.  She is a former smoker.  Her CAD is s/p angioplasty and DES to proximal LAD with balloon angioplasty to the diagonal September 2013.  Treadmill nuclear stress test September 2014 fixed anterior wall defect with minor reversibility and normal LVEF.    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 Seen in clinic 11/02/19 for clearance for chemotherapy. Echo 11/02/19 showed LVEF 30-35%, gr1DD, RV normal size and function, mildly elevated PASP, mild-moderate MR/AI. 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  Subsequent stress test to rule out ischemic cardiomyopathy was high risk. R/LHC performed 12/04/19 with LVEF 30-35%, LVEDP moderately elevated, previous LAD stent patent, ost-prox LAD 40%. She was started on Lasix 20mg  daily. This was reduced to PRN due to orthostasis.    Hospitalized in [facility] 01/2020 due to volume overload.  Echo 01/26/20 at outside facility EF 25-30%, severe global hypokinesis, RV normal size/function, moderate MR, mild AI  without aortic stenosis. CT chest with no PE, pulmonary edema with small bilateral pleural effusions, soft tissue thickening bronchiectasis likely scar/post radiation changes.   At clinic 02/05/20 her Metoprolol was transitioned to Coreg and Losartan for optimization of GDMT for HR.   Subsequent admission 02/14/20-02/16/20 for worsening shortness of breath diagnosed with acute segmental PE in RLL treated with IV heparin and transitioned to Eliquis. She was readmitted 02/19/20 - 02/22/20 with acute respiratory distress secondary to acute on chronic HFrEF and hemoptysis. She was transitioned to Lasix 40mg  daily. Her Lipitor, Losartan, Coreg were continued. Due to her hemoptysis her Eliquis was discontinued. Her Levothyroxine dose was increased with recommendation for repeat thyroid function studies in 4-6 weeks. She was discharged with home health and oxygen as well as Lasix 40mg  daily.    She was readmitted 05/19/2020 due to shortness of breath in setting of acute on chronic combined systolic and diastolic heart failure.  She was treated with IV diuresis.  Echo 05/20/2020 with LVEF less than 20%, global hypokinesis of the LV, LV mildly dilated, normal diastolic parameters, RV normal size and function, LA mildly dilated, mild MR, mild to moderate AI, mild  to moderate aortic valve sclerosis without stenosis.  Her GDMT was limited due to hypotension.  Carvedilol had to be discontinued due to soft blood pressures. Corlanor was started but denied by insurance and as such, transitioned to Toprol 25mg  daily.   Seen in follow up 05/31/20. Noted elevated heart rate and was started on Toprol 25mg  daily as Ivabradine was denied by insurance. However, on telephone call 06/26/20 she reported intolerance to Toprol 25mg  due to fatigue, weakness.  It was reduced to 12.5 mg daily.  She presents today for follow up. Since last seen they had the funeral service for her mom and tells me it was a very nice service. Tells me she has been  intolerant of the Toprol even a dose of 12.5 mg with fatigue, hypotension.  She reports no chest pain, pressure, tightness.  Reports no shortness of breath at rest and reports stable dyspnea on exertion.  Tells me she gets fatigued very easily and has to take lots of breaks.  She has upcoming appointment with Dr. Janese Banks of oncology. She previously declined hospice services but we had lengthy discussion today that hospice services likely would offer additional support.  She is feeling somewhat overwhelmed with keeping up with her home. Tells me she feels exhausted all the time.  She has had poor appetite.  EKGs/Labs/Other Studies Reviewed:   The following studies were reviewed today:  Echo 05/20/20 1. Left ventricular ejection fraction, by estimation, is <20%. The left  ventricle has severely decreased function. The left ventricle demonstrates  global hypokinesis. The left ventricular internal cavity size was mildly  dilated. Left ventricular  diastolic parameters were normal.   2. Right ventricular systolic function is normal. The right ventricular  size is normal.   3. Left atrial size was mildly dilated.   4. The mitral valve was not well visualized. Mild mitral valve  regurgitation.   5. The aortic valve was not well visualized. Aortic valve regurgitation  is mild to moderate. Mild to moderate aortic valve sclerosis/calcification  is present, without any evidence of aortic stenosis.   Echo 11/02/19 IMPRESSIONS    1. Left ventricular ejection fraction, by estimation, is 30 to 35%. The  left ventricle has moderately decreased function. The left ventricle  demonstrates global hypokinesis. Left ventricular diastolic parameters are  consistent with Grade I diastolic  dysfunction (impaired relaxation).   2. Right ventricular systolic function is normal. The right ventricular  size is normal. There is mildly elevated pulmonary artery systolic  pressure. The estimated right ventricular systolic  pressure is [id number] mmHg.   3. Mild to moderate mitral valve regurgitation.   4. Aortic valve regurgitation is mild to moderate.   EKG:  EKG is ordered today.  The ekg ordered today demonstrates Sinus tachycardia 125 bpm with occasional premature atrial complexes.  None previous T wave inversion in lateral leads.  Likely today related to her tachycardia.  Recent Labs: 05/17/2020: TSH 0.637 05/23/2020: B Natriuretic Peptide 500.2 05/24/2020: Magnesium 2.0 06/10/2020: ALT 11; BUN 17; Creatinine, Ser 1.51; Hemoglobin 9.6; Platelets 268; Potassium 3.3; Sodium 137  Recent Lipid Panel    Component Value Date/Time   CHOL 114 12/30/2019 0457   CHOL 110 04/11/2012 0835   TRIG 131 12/30/2019 0457   HDL 47 12/30/2019 0457   HDL 56 04/11/2012 0835   CHOLHDL 2.4 12/30/2019 0457   VLDL 26 12/30/2019 0457   LDLCALC 41 12/30/2019 0457   LDLCALC 41 04/11/2012 0835   Home Medications   Current Meds  Medication Sig  . amitriptyline (ELAVIL) 25 MG tablet Take 1 tablet (25 mg total) by mouth at bedtime. May increase weekly as needed  . apixaban (ELIQUIS) 2.5 MG TABS tablet Take 1 tablet (2.5 mg total) by mouth 2 (two) times daily.  Marland Kitchen atorvastatin (LIPITOR) 10 MG tablet Take 1 tablet by mouth once daily  . cholecalciferol (VITAMIN D3) 25 MCG (1000 UNIT) tablet Take 1,000 Units by mouth in the morning and at bedtime.  . cyanocobalamin (,VITAMIN B-12,) 1000 MCG/ML injection Inject 1,000 mcg into the muscle every 30 (thirty) days.  . empagliflozin (JARDIANCE) 10 MG TABS tablet Take 1 tablet (10 mg total) by mouth daily.  . furosemide (LASIX) 20 MG tablet Take 1 tablet (20 mg total) by mouth as needed for fluid or edema.  Marland Kitchen HYDROcodone-homatropine (HYCODAN) 5-1.5 MG/5ML syrup Take 5 mLs by mouth every 6 (six) hours as needed for cough.  . ivabradine (CORLANOR) 5 MG TABS tablet Take 1 tablet (5 mg total) by mouth 2 (two) times daily with a meal.  . levothyroxine (SYNTHROID) 75 MCG tablet Take 1 tablet (75 mcg  total) by mouth daily before breakfast.  . LORazepam (ATIVAN) 0.5 MG tablet Take 1 tablet (0.5 mg total) by mouth every 6 (six) hours as needed for anxiety (and to help with breathing).  . Morphine Sulfate (MORPHINE CONCENTRATE) 10 mg / 0.5 ml concentrated solution Take 0.25 mLs (5 mg total) by mouth every 6 (six) hours as needed for severe pain or shortness of breath.  . pantoprazole (PROTONIX) 20 MG tablet Take 1 tablet (20 mg total) by mouth daily.  . potassium chloride (KLOR-CON) 10 MEQ tablet Take 1 tablet (10 mEq total) by mouth daily as needed (Take on days that you take the Lasix.).  . [DISCONTINUED] metoprolol succinate (TOPROL XL) 25 MG 24 hr tablet Take 0.5 tablets (12.5 mg total) by mouth daily.    Review of Systems   Review of Systems  Constitutional: Positive for malaise/fatigue. Negative for chills and fever.  Cardiovascular: Positive for dyspnea on exertion. Negative for chest pain, irregular heartbeat, leg swelling, near-syncope, orthopnea, palpitations and syncope.  Respiratory: Negative for cough, shortness of breath and wheezing.   Gastrointestinal: Negative for melena, nausea and vomiting.  Genitourinary: Negative for hematuria.  Neurological: Positive for weakness. Negative for dizziness and light-headedness.   All other systems reviewed and are otherwise negative except as noted above.  Physical Exam    VS:  BP 98/60 (BP Location: Right Arm, Patient Position: Sitting, Cuff Size: Normal)   Pulse (!) 125   Ht 5\' 1"  (1.549 m)   Wt 140 lb (63.5 kg)   SpO2 98%   BMI 26.45 kg/m  , BMI Body mass index is 26.45 kg/m.  Wt Readings from Last 3 Encounters:  07/09/20 140 lb (63.5 kg)  06/25/20 149 lb (67.6 kg)  06/10/20 149 lb 1.6 oz (67.6 kg)    GEN: Well nourished, well developed, in no acute distress. HEENT: normal. Neck: Supple, no JVD or masses. Cardiac: tachycardic, RRR, no murmurs, rubs, or gallops. No clubbing, cyanosis, edema.  Radials/PT 2+ and equal  bilaterally.  Respiratory:  Respirations regular and unlabored, clear to auscultation bilaterally. GI: Soft, nontender, nondistended. MS: No deformity or atrophy. Skin: Warm and dry, no rash. Neuro:  Strength and sensation are intact. Psych: Normal affect.  Assessment & Plan    1. NICM/HFrEF - In setting of cancer and chemotherapy treatments.  Echo 7/62/21 LVEF 30-35%.  Echo 05/20/2020 LVEF less than 20%.  Optimization of heart failure therapies has been limited by hypotension.  Intolerant of Carvedilol and Losartan due to hypotension.  Intolerant of Metoprolol due to fatigue. Start Ivabradine 5mg  BID, samples provided. This was previously denied by insurance as she had to trial Metoprolol prior to authorization. As she has been unable to tolerate Metoprolol, hopeful insurance will approve. Additional GDMT includes Jardiance 10mg  daily, sample provided in clinic. If she continues to have significant changes in appetite may consider discontinuation. She will continue Lasix/Potassium as needed for weight gain or fluid retention and is euvolemic on exam today while taking around 3 times per week.   2. CAD s/p prior PCI to LAD - No symptoms concerning for worsening angina.  Present GDMT includes statin.  No aspirin secondary to chronic anticoagulation, no beta blocker due to intolerance.   3. Multiple malignancies - Follows with Dr. Janese Banks of oncology.  CT abdomen 12/13/1 shows interval increase in size and number of omental implants in right lower abdomen, new bilateral subdiaphragmatic peritoneal implants, new hepatic metastatic disease, no evidence of metastatic disease in the chest. Has been intolerant of chemotherapy and immunotherapy. Encouraged to discuss hospice with Dr. Janese Banks at upcoming appointment on Thursday as they may be able to offer her assistance with household tasks which have become difficult. She previously declined but we discussed that hospice can be for individuals with up to 6 months of  life expectancy and is not solely for those bed bound.   4. History of PE - Continue Eliquis 2.5 mg twice daily.  Denies bleeding complications. Samples provided in clinic.   5. PSVT-  Previously intolerant to carvedilol due to hypotension. She was tolerating Ivabradine during recent admission but insurance denied. Toprol had to be reduced from 25mg  to 12.5mg  due to fatigue and she was intolerant of even the reduced dose. As such, start Ivabradine 5mg  BID.   Disposition: Phone call in 1 week to check in on fatigue since discontinuation of Metoprolol. Follow up 1-2 months with Dr. [name] or APP.   Signed, Loel Dubonnet, NP 07/09/2020, 4:35 PM [facility]

## 2020-07-05 ENCOUNTER — Encounter: Payer: Self-pay | Admitting: Oncology

## 2020-07-09 ENCOUNTER — Ambulatory Visit: Payer: PPO | Admitting: Family

## 2020-07-09 ENCOUNTER — Encounter: Payer: Self-pay | Admitting: Family

## 2020-07-09 ENCOUNTER — Other Ambulatory Visit: Payer: Self-pay

## 2020-07-09 VITALS — BP 98/60 | HR 125 | Ht 61.0 in | Wt 140.0 lb

## 2020-07-09 DIAGNOSIS — I5022 Chronic systolic (congestive) heart failure: Secondary | ICD-10-CM

## 2020-07-09 DIAGNOSIS — I5020 Unspecified systolic (congestive) heart failure: Secondary | ICD-10-CM | POA: Diagnosis not present

## 2020-07-09 DIAGNOSIS — Z86711 Personal history of pulmonary embolism: Secondary | ICD-10-CM

## 2020-07-09 DIAGNOSIS — I471 Supraventricular tachycardia: Secondary | ICD-10-CM | POA: Diagnosis not present

## 2020-07-09 DIAGNOSIS — I25118 Atherosclerotic heart disease of native coronary artery with other forms of angina pectoris: Secondary | ICD-10-CM

## 2020-07-09 DIAGNOSIS — I428 Other cardiomyopathies: Secondary | ICD-10-CM | POA: Diagnosis not present

## 2020-07-09 MED ORDER — IVABRADINE HCL 5 MG PO TABS: 5.0000 mg | ORAL_TABLET | Freq: Two times a day (BID) | ORAL | 5 refills | Status: DC

## 2020-07-09 NOTE — Patient Instructions (Signed)
Medication Instructions:  Your physician has recommended you make the following change in your medication:   STOP Metoprolol   START Ivabradine 5mg  twice daily  *If you need a refill on your cardiac medications before your next appointment, please call your pharmacy*  Lab Work: None ordered today.   Testing/Procedures: Your EKG today shows sinus tachycardia. The new medication will help to reduce your heart rate.   Follow-Up: At [facility], you and your health needs are our priority.  As part of our continuing mission to provide you with exceptional heart care, we have created designated Provider Care Teams.  These Care Teams include your primary Cardiologist (physician) and Advanced Practice Providers (APPs -  Physician Assistants and Nurse Practitioners) who all work together to provide you with the care you need, when you need it.  We recommend signing up for the patient portal called "MyChart".  Sign up information is provided on this After Visit Summary.  MyChart is used to connect with patients for Virtual Visits (Telemedicine).  Patients are able to view lab/test results, encounter notes, upcoming appointments, etc.  Non-urgent messages can be sent to your provider as well.   To learn more about what you can do with MyChart, go to [email or web address].    Your next appointment:   1-2  month(s)  The format for your next appointment:   In Person or Virtual  Provider:   You may see [name], MD or one of the following Advanced Practice Providers on your designated Care Team:    Murray Hodgkins, NP  Christell Faith, PA-C  Marrianne Mood, PA-C  Cadence Ramos, [place]  Laurann Montana, NP  Other Instructions

## 2020-07-11 ENCOUNTER — Encounter: Payer: Self-pay | Admitting: Oncology

## 2020-07-11 ENCOUNTER — Inpatient Hospital Stay: Payer: PPO | Attending: Oncology | Admitting: Oncology

## 2020-07-11 ENCOUNTER — Other Ambulatory Visit: Payer: Self-pay

## 2020-07-11 VITALS — BP 104/55 | HR 81 | Temp 97.8°F | Resp 16 | Wt 139.9 lb

## 2020-07-11 DIAGNOSIS — Z7901 Long term (current) use of anticoagulants: Secondary | ICD-10-CM | POA: Diagnosis not present

## 2020-07-11 DIAGNOSIS — Z882 Allergy status to sulfonamides status: Secondary | ICD-10-CM | POA: Insufficient documentation

## 2020-07-11 DIAGNOSIS — T451X5A Adverse effect of antineoplastic and immunosuppressive drugs, initial encounter: Secondary | ICD-10-CM | POA: Insufficient documentation

## 2020-07-11 DIAGNOSIS — I2510 Atherosclerotic heart disease of native coronary artery without angina pectoris: Secondary | ICD-10-CM | POA: Insufficient documentation

## 2020-07-11 DIAGNOSIS — R109 Unspecified abdominal pain: Secondary | ICD-10-CM | POA: Diagnosis not present

## 2020-07-11 DIAGNOSIS — Z8042 Family history of malignant neoplasm of prostate: Secondary | ICD-10-CM | POA: Insufficient documentation

## 2020-07-11 DIAGNOSIS — Z800 Family history of malignant neoplasm of digestive organs: Secondary | ICD-10-CM | POA: Insufficient documentation

## 2020-07-11 DIAGNOSIS — R1031 Right lower quadrant pain: Secondary | ICD-10-CM | POA: Insufficient documentation

## 2020-07-11 DIAGNOSIS — Z808 Family history of malignant neoplasm of other organs or systems: Secondary | ICD-10-CM | POA: Insufficient documentation

## 2020-07-11 DIAGNOSIS — C786 Secondary malignant neoplasm of retroperitoneum and peritoneum: Secondary | ICD-10-CM

## 2020-07-11 DIAGNOSIS — K219 Gastro-esophageal reflux disease without esophagitis: Secondary | ICD-10-CM | POA: Diagnosis not present

## 2020-07-11 DIAGNOSIS — C541 Malignant neoplasm of endometrium: Secondary | ICD-10-CM | POA: Diagnosis not present

## 2020-07-11 DIAGNOSIS — Z888 Allergy status to other drugs, medicaments and biological substances status: Secondary | ICD-10-CM | POA: Insufficient documentation

## 2020-07-11 DIAGNOSIS — N1832 Chronic kidney disease, stage 3b: Secondary | ICD-10-CM | POA: Diagnosis not present

## 2020-07-11 DIAGNOSIS — G620 Drug-induced polyneuropathy: Secondary | ICD-10-CM | POA: Diagnosis not present

## 2020-07-11 DIAGNOSIS — Z823 Family history of stroke: Secondary | ICD-10-CM | POA: Insufficient documentation

## 2020-07-11 DIAGNOSIS — I130 Hypertensive heart and chronic kidney disease with heart failure and stage 1 through stage 4 chronic kidney disease, or unspecified chronic kidney disease: Secondary | ICD-10-CM | POA: Diagnosis not present

## 2020-07-11 DIAGNOSIS — D649 Anemia, unspecified: Secondary | ICD-10-CM | POA: Diagnosis not present

## 2020-07-11 DIAGNOSIS — Z820 Family history of epilepsy and other diseases of the nervous system: Secondary | ICD-10-CM | POA: Insufficient documentation

## 2020-07-11 DIAGNOSIS — Z90722 Acquired absence of ovaries, bilateral: Secondary | ICD-10-CM | POA: Diagnosis not present

## 2020-07-11 DIAGNOSIS — R5383 Other fatigue: Secondary | ICD-10-CM | POA: Diagnosis not present

## 2020-07-11 DIAGNOSIS — Z8571 Personal history of Hodgkin lymphoma: Secondary | ICD-10-CM | POA: Insufficient documentation

## 2020-07-11 DIAGNOSIS — Z7189 Other specified counseling: Secondary | ICD-10-CM

## 2020-07-11 DIAGNOSIS — Z79899 Other long term (current) drug therapy: Secondary | ICD-10-CM | POA: Insufficient documentation

## 2020-07-11 DIAGNOSIS — Z9049 Acquired absence of other specified parts of digestive tract: Secondary | ICD-10-CM | POA: Diagnosis not present

## 2020-07-11 DIAGNOSIS — I509 Heart failure, unspecified: Secondary | ICD-10-CM | POA: Insufficient documentation

## 2020-07-11 DIAGNOSIS — Z881 Allergy status to other antibiotic agents status: Secondary | ICD-10-CM | POA: Insufficient documentation

## 2020-07-11 DIAGNOSIS — Z803 Family history of malignant neoplasm of breast: Secondary | ICD-10-CM | POA: Insufficient documentation

## 2020-07-11 DIAGNOSIS — Z90710 Acquired absence of both cervix and uterus: Secondary | ICD-10-CM | POA: Insufficient documentation

## 2020-07-11 DIAGNOSIS — Z87891 Personal history of nicotine dependence: Secondary | ICD-10-CM | POA: Diagnosis not present

## 2020-07-11 DIAGNOSIS — Z923 Personal history of irradiation: Secondary | ICD-10-CM | POA: Insufficient documentation

## 2020-07-11 DIAGNOSIS — Z833 Family history of diabetes mellitus: Secondary | ICD-10-CM | POA: Insufficient documentation

## 2020-07-11 DIAGNOSIS — Z818 Family history of other mental and behavioral disorders: Secondary | ICD-10-CM | POA: Insufficient documentation

## 2020-07-11 NOTE — Progress Notes (Signed)
Drinking good, eating some. The heart med that she was on made her very fatigued and they changed it this week but it is not out of her system yet

## 2020-07-12 NOTE — Progress Notes (Signed)
Hematology/Oncology Consult note [facility]  Telephone:([phone] [phone]  Patient Care Team: [name], MD as PCP - General (Internal Medicine) Wellington Hampshire, MD as PCP - Cardiology (Cardiology) [name], MD as Consulting Physician (General Surgery) Mellody Drown, MD as Referring Physician (Obstetrics and Gynecology) [name], MD as Referring Physician (Obstetrics and Gynecology) Hollice Espy, MD as Consulting Physician (Urology) [name], RN as Registered Nurse Sindy Guadeloupe, MD as Consulting Physician (Oncology)   Name of the patient: Jaime Hall  [id number]  27-Jun-1950   Date of visit: 07/12/20  Diagnosis- 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 She underwent robotic hysterectomy with bilateral salpingo-oophorectomy with washings and sentinel lymph node injection mapping and biopsy in February 2018. She had multiple postoperative complications and completed 3 cycles of carbotaxol chemotherapy in May 2018. Treatment was complicated by chemo-induced peripheral neuropathy for which Taxol dose was reduced by 25%. She completed 5 weeks of external beam whole pelvic radiation on August 2018.Her original tumor in 2018 was ER +1%. PR negative. MSI stable and HER-2 negative  She was under clinical surveillance and recently presented to [name] with symptoms of right lower quadrant abdominal pain which led to aCT abdomen which showed new peritoneal metastatic disease throughout the right abdomen and pelvis with index masses measuring 5.7 x 4.7 cm. Head CT  scan also showed evidence of hypermetabolic abdominal pelvic peritoneal metastases but no evidence of other sites of metastases.  Repeat omental biopsy confirms high grade serous carcinoma.Foundation 1 testing showed following alteration/biomarkers. Microsatellite status stable. EPHB4amplification, ESR 1 amplification, HNF1 A G 292 FS 25, pik3r1 loss PARP1 amplification3 ER 1 loss, PPP 2R1 AAS 256F, PTEN loss, T p53. However none of these mutations have actionable targets.  Repeat echocardiogram showed EF of 30 to 35%. Cardiac cath unremarkable consistent with nonischemic cardiomyopathy.Therefore Doxil not chosen and she would be getting carboplatin and gemcitabine. Chemotherapy complicated by significant anemia causing supply demand cardiac ischemia and hospitalization.Patient switched to [facility] and lenvatinib starting 01/11/2020  Patient received two cycles of Keytruda but treatment washeldsince August 2021 due to repeated hospitalizations for possible heart failure followed by diagnosis of PE.   Interval history-patient has not had any recent hospitalizations.  She continues to follow-up with cardiology to optimize her medications for heart failure.  She continues to live alone and perform her ADLs.  Reports occasional abdominal pain for which she uses as needed oxycodone.  Denies any nausea or vomiting.  Bowel movements have been regular.  ECOG PS- 2 Pain scale- 3 Opioid associated constipation- no  Review of systems- Review of Systems  Constitutional: Positive for malaise/fatigue. Negative for chills, fever and weight loss.  HENT: Negative for congestion, ear discharge and nosebleeds.   Eyes: Negative for blurred vision.  Respiratory: Negative for cough, hemoptysis, sputum production, shortness of breath and wheezing.   Cardiovascular: Negative for chest pain, palpitations, orthopnea and claudication.  Gastrointestinal: Positive for abdominal pain. Negative for blood in  stool, constipation, diarrhea, heartburn, melena, nausea and vomiting.  Genitourinary: Negative for dysuria, flank pain, frequency, hematuria and urgency.  Musculoskeletal: Negative for back pain, joint pain and myalgias.  Skin: Negative for rash.  Neurological: Negative for dizziness, tingling, focal weakness, seizures, weakness and headaches.  Endo/Heme/Allergies: Does not bruise/bleed easily.  Psychiatric/Behavioral: Negative for depression and suicidal ideas. The patient does not have insomnia.       Allergies  Allergen Reactions  . Metoprolol Other (See Comments)    Fatigue  . Adhesive [Tape] Other (See Comments)    Skin Irritation   . Antifungal [Miconazole Nitrate] Rash  . Sulfa Antibiotics Nausea And Vomiting and Rash  . Z-Pak [Azithromycin] Itching     Past Medical History:  Diagnosis Date  . Anxiety   . Cervicalgia   . CHF (congestive heart failure) ([place])   . Chronic kidney disease   . Coronary artery disease    a. 02/2012 Stress echo: severe anterior wall ischemia;  b. 02/2012 Cath/PCI: LAD 95p (3.0 x 15 Xience EX DES), D1 90ost (PTCA - bifurcational dzs), EF 45% with anterior HK;  b. 02/2013 Ex MV: fixed anterior defect w/ minor reversibility, nl EF-->Med Rx.  . Endometrial cancer ([place])    a. 07/2016 s/p robotic hysterectomy, BSO w/ washings, sentinel node inj, mapping, bx, adhesiolysis.  . Essential hypertension, benign   . Fibrocystic breast disease   . GERD (gastroesophageal reflux disease)   . Gestational hypertension   . Heart murmur   . History of anemia   . History of blood transfusion   . Hodgkin's lymphoma ([place]) 2011   a. s/p radiation and chemo therapy  . Osteoarthritis   . Polycystic ovarian disease      Past Surgical History:  Procedure Laterality Date  . ABDOMINAL HYSTERECTOMY    . bladder sling    . CARDIAC CATHETERIZATION  02/2012   [facility] 1 stent place  . CERVICAL POLYPECTOMY    . CHOLECYSTECTOMY  1982  . COLONOSCOPY WITH PROPOFOL N/A  02/05/2015   Procedure: COLONOSCOPY WITH PROPOFOL;  Surgeon: Lucilla Lame, MD;  Location: [facility] ENDOSCOPY;  Service: Endoscopy;  Laterality: N/A;  . CORONARY ANGIOPLASTY  02/2012   left/right s/p balloon  . CYSTOGRAM N/A 08/17/2016   Procedure: CYSTOGRAM;  Surgeon: Hollice Espy, MD;  Location: [facility] ORS;  Service: Urology;  Laterality: N/A;  . CYSTOSCOPY N/A 08/17/2016   Procedure: CYSTOSCOPY EXAM UNDER ANESTHESIA;  Surgeon: Hollice Espy, MD;  Location: [facility] ORS;  Service: Urology;  Laterality: N/A;  . CYSTOSCOPY W/ RETROGRADES Bilateral 08/17/2016   Procedure: CYSTOSCOPY WITH RETROGRADE PYELOGRAM;  Surgeon: Hollice Espy, MD;  Location: [facility] ORS;  Service: Urology;  Laterality: Bilateral;  . CYSTOSCOPY WITH STENT PLACEMENT Right 08/17/2016   Procedure: CYSTOSCOPY WITH STENT PLACEMENT;  Surgeon: Hollice Espy, MD;  Location: [facility] ORS;  Service: Urology;  Laterality: Right;  . heart stent'  2013  . kidney stent Right 2018  . LYMPH NODE BIOPSY  2011   diagnosis of hodgkins lymphoma  . PELVIC LYMPH NODE DISSECTION N/A 07/29/2016   Procedure: PELVIC/AORTIC LYMPH NODE SAMPLING;  Surgeon: [name], MD;  Location: [facility] ORS;  Service: Gynecology;  Laterality: N/A;  . PORTA CATH INSERTION N/A 09/22/2016   Procedure: Glori Luis Cath Insertion;  Surgeon: [name], MD;  Location: [place] [facility];  Service: Cardiovascular;  Laterality: N/A;  . PORTA CATH INSERTION N/A 10/27/2019   Procedure: PORTA CATH INSERTION;  Surgeon: [name], MD;  Location: [place] [facility];  Service: Cardiovascular;  Laterality: N/A;  . PORTA CATH REMOVAL N/A 11/17/2016   Procedure: Glori Luis Cath Removal;  Surgeon: [name], MD;  Location: [place] [facility];  Service: Cardiovascular;  Laterality: N/A;  . RIGHT/LEFT HEART CATH AND CORONARY ANGIOGRAPHY N/A 12/04/2019   Procedure: RIGHT/LEFT HEART CATH AND CORONARY ANGIOGRAPHY;  Surgeon: Wellington Hampshire, MD;  Location: [place] [facility];   Service: Cardiovascular;  Laterality: N/A;  . ROBOTIC ASSISTED TOTAL HYSTERECTOMY WITH BILATERAL SALPINGO OOPHERECTOMY N/A 07/29/2016   Procedure: ROBOTIC ASSISTED TOTAL HYSTERECTOMY WITH BILATERAL SALPINGO OOPHORECTOMY;  Surgeon: [name], MD;  Location: [facility] ORS;  Service: Gynecology;  Laterality: N/A;  . SENTINEL NODE BIOPSY N/A 07/29/2016   Procedure: SENTINEL NODE BIOPSY;  Surgeon: [name], MD;  Location: [facility] ORS;  Service: Gynecology;  Laterality: N/A;  . transobturator sling N/A 2009   [place]    Social History   Socioeconomic History  . Marital status: Widowed    Spouse name: Not on file  . Number of children: Not on file  . Years of education: Not on file  . Highest education level: Not on file  Occupational History  . Not on file  Tobacco Use  . Smoking status: Former Smoker    Packs/day: 1.00    Years: 30.00    Pack years: 30.00    Types: Cigarettes    Quit date: 07/24/2002    Years since quitting: 17.9  . Smokeless tobacco: Never Used  . Tobacco comment: quit smoking in 2000  Vaping Use  . Vaping Use: Never used  Substance and Sexual Activity  . Alcohol use: Not Currently  . Drug use: No  . Sexual activity: Never  Other Topics Concern  . 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    Social Determinants of Health   Financial Resource Strain: Low Risk   . Difficulty of Paying Living Expenses: Not hard at all  Food Insecurity: No Food Insecurity  . Worried About Charity fundraiser in the Last Year: Never true  . Ran Out of Food in the Last Year: Never true  Transportation Needs: No Transportation Needs  . Lack of Transportation (Medical): No  . Lack of Transportation (Non-Medical): No  Physical Activity: Unknown  . Days of Exercise per Week: 0 days  . Minutes of Exercise per Session: Not on file  Stress: No Stress Concern Present  . Feeling of  Stress : Only a little  Social Connections: Unknown  . Frequency of Communication with Friends and Family: More than three times a week  . Frequency of Social Gatherings with Friends and Family: More than three times a week  . Attends Religious Services: Not on file  . Active Member of Clubs or Organizations: Not on file  . Attends Archivist Meetings: Not on file  . Marital Status: Not on file  Intimate Partner Violence: Not At Risk  . Fear of Current or Ex-Partner: No  . Emotionally Abused: No  . Physically Abused: No  . Sexually Abused: No    Family History  Problem Relation Age of Onset  . ALS Father   . Polymyositis Father   . Diabetes Brother   . Dementia Mother   . Cancer Maternal Aunt        breast  . Breast cancer Maternal Aunt        30's  . Stroke Maternal Grandmother   . Cancer Maternal Grandfather        prostate  . Stroke Maternal Grandfather   . Colon cancer Maternal Aunt   .  Non-Hodgkin's lymphoma Cousin      Current Outpatient Medications:  .  amitriptyline (ELAVIL) 25 MG tablet, Take 1 tablet (25 mg total) by mouth at bedtime. May increase weekly as needed, Disp: 90 tablet, Rfl: 3 .  apixaban (ELIQUIS) 2.5 MG TABS tablet, Take 1 tablet (2.5 mg total) by mouth 2 (two) times daily., Disp: 60 tablet, Rfl: 1 .  atorvastatin (LIPITOR) 10 MG tablet, Take 1 tablet by mouth once daily, Disp: 90 tablet, Rfl: 2 .  cholecalciferol (VITAMIN D3) 25 MCG (1000 UNIT) tablet, Take 1,000 Units by mouth in the morning and at bedtime., Disp: , Rfl:  .  cyanocobalamin (,VITAMIN B-12,) 1000 MCG/ML injection, Inject 1,000 mcg into the muscle every 30 (thirty) days., Disp: , Rfl:  .  empagliflozin (JARDIANCE) 10 MG TABS tablet, Take 1 tablet (10 mg total) by mouth daily., Disp: 30 tablet, Rfl: 5 .  furosemide (LASIX) 20 MG tablet, Take 1 tablet (20 mg total) by mouth as needed for fluid or edema., Disp: 30 tablet, Rfl: 2 .  HYDROcodone-homatropine (HYCODAN) 5-1.5 MG/5ML  syrup, Take 5 mLs by mouth every 6 (six) hours as needed for cough., Disp: 120 mL, Rfl: 0 .  ivabradine (CORLANOR) 5 MG TABS tablet, Take 1 tablet (5 mg total) by mouth 2 (two) times daily with a meal., Disp: 60 tablet, Rfl: 5 .  levothyroxine (SYNTHROID) 75 MCG tablet, Take 1 tablet (75 mcg total) by mouth daily before breakfast., Disp: 30 tablet, Rfl: 2 .  LORazepam (ATIVAN) 0.5 MG tablet, Take 1 tablet (0.5 mg total) by mouth every 6 (six) hours as needed for anxiety (and to help with breathing)., Disp: 120 tablet, Rfl: 0 .  Morphine Sulfate (MORPHINE CONCENTRATE) 10 mg / 0.5 ml concentrated solution, Take 0.25 mLs (5 mg total) by mouth every 6 (six) hours as needed for severe pain or shortness of breath., Disp: 30 mL, Rfl: 0 .  pantoprazole (PROTONIX) 20 MG tablet, Take 1 tablet (20 mg total) by mouth daily., Disp: 60 tablet, Rfl: 1 .  potassium chloride (KLOR-CON) 10 MEQ tablet, Take 1 tablet (10 mEq total) by mouth daily as needed (Take on days that you take the Lasix.)., Disp: 30 tablet, Rfl: 2 No current facility-administered medications for this visit.  Facility-Administered Medications Ordered in Other Visits:  .  heparin lock flush 100 unit/mL, 500 Units, Intravenous, Once, Randa Evens C, MD .  sodium chloride flush (NS) 0.9 % injection 10 mL, 10 mL, Intravenous, PRN, Sindy Guadeloupe, MD, 10 mL at 12/07/19 0914 .  sodium chloride flush (NS) 0.9 % injection 10 mL, 10 mL, Intravenous, PRN, Sindy Guadeloupe, MD, 10 mL at 01/04/20 0850  Physical exam:  Vitals:   07/11/20 0848  BP: (!) 104/55  Pulse: 81  Resp: 16  Temp: 97.8 F (36.6 C)  TempSrc: Oral  Weight: 139 lb 14.4 oz (63.5 kg)   Physical Exam Constitutional:      General: She is not in acute distress. Eyes:     Extraocular Movements: EOM normal.  Cardiovascular:     Rate and Rhythm: Normal rate and regular rhythm.     Heart sounds: Normal heart sounds.  Pulmonary:     Effort: Pulmonary effort is normal.     Breath  sounds: Normal breath sounds.  Abdominal:     General: Bowel sounds are normal.     Palpations: Abdomen is soft.  Skin:    General: Skin is warm and dry.  Neurological:  Mental Status: She is alert and oriented to person, place, and time.      CMP Latest Ref Rng & Units 06/10/2020  Glucose 70 - 99 mg/dL 99  BUN 8 - 23 mg/dL 17  Creatinine 0.44 - 1.00 mg/dL 1.51(H)  Sodium 135 - 145 mmol/L 137  Potassium 3.5 - 5.1 mmol/L 3.3(L)  Chloride 98 - 111 mmol/L 101  CO2 22 - 32 mmol/L 22  Calcium 8.9 - 10.3 mg/dL 8.9  Total Protein 6.5 - 8.1 g/dL 7.0  Total Bilirubin 0.3 - 1.2 mg/dL 0.5  Alkaline Phos 38 - 126 U/L 96  AST 15 - 41 U/L 20  ALT 0 - 44 U/L 11   CBC Latest Ref Rng & Units 06/10/2020  WBC 4.0 - 10.5 K/uL 9.8  Hemoglobin 12.0 - 15.0 g/dL 9.6(L)  Hematocrit 36.0 - 46.0 % 31.1(L)  Platelets 150 - 400 K/uL 268      Assessment and plan- Patient is a 70 y.o. female  with recurrent endometrial cancer with peritoneal carcinomatosis.  She is currently off all treatment and is here for routine follow-up  Patient last received Keytruda on 05/17/2020.  Scan subsequently showed disease progression.  She has tolerated chemotherapy poorly in the past with worsening cytopenias and hospitalizations.  Mtor inhibitors could be potential options but given she had high-grade serous histology and potential side effects associated with these medications plan is to monitor her off on treatment.  We have discussed pursuing home hospice in the past as well But patient does not have existing heart failure and wishes to follow-up with cardiology closely.  She is also on multiple medications for heart failure which may not be covered by hospice at this time.  Patient understands that with peritoneal carcinomatosis there is a potential risk of malignant bowel obstruction.  Clinically she does not have the signs and symptoms at this time.  We will continue to monitor her off treatment and I will see her  back in 6 weeks with labs.  If her performance status declines further, transition to home hospice would be appropriate.  Visit Diagnosis 1. Peritoneal carcinomatosis [facility])      Dr. Randa Evens, MD, MPH [facility] [phone] 07/12/2020 9:43 AM

## 2020-07-15 ENCOUNTER — Encounter: Payer: Self-pay | Admitting: Oncology

## 2020-07-15 ENCOUNTER — Other Ambulatory Visit: Payer: Self-pay

## 2020-07-15 MED ORDER — LEVOTHYROXINE SODIUM 75 MCG PO TABS: 75.0000 ug | ORAL_TABLET | Freq: Every day | ORAL | 2 refills | Status: AC

## 2020-07-16 ENCOUNTER — Encounter: Payer: Self-pay | Admitting: Family

## 2020-07-16 ENCOUNTER — Telehealth: Payer: Self-pay | Admitting: Pharmacist

## 2020-07-16 ENCOUNTER — Telehealth: Payer: Self-pay

## 2020-07-16 DIAGNOSIS — I471 Supraventricular tachycardia: Secondary | ICD-10-CM

## 2020-07-16 DIAGNOSIS — I5020 Unspecified systolic (congestive) heart failure: Secondary | ICD-10-CM

## 2020-07-16 DIAGNOSIS — I428 Other cardiomyopathies: Secondary | ICD-10-CM

## 2020-07-16 MED ORDER — EMPAGLIFLOZIN 10 MG PO TABS: 10.0000 mg | ORAL_TABLET | Freq: Every day | ORAL | 5 refills | Status: AC

## 2020-07-16 MED ORDER — IVABRADINE HCL 5 MG PO TABS: 5.0000 mg | ORAL_TABLET | Freq: Two times a day (BID) | ORAL | 5 refills | Status: AC

## 2020-07-16 NOTE — Telephone Encounter (Signed)
Please call to discuss medications most recent prescriptions.

## 2020-07-16 NOTE — Telephone Encounter (Signed)
Spoke with [place] PA>  PA for [employer] was approved.  Patient is aware

## 2020-07-16 NOTE — Telephone Encounter (Signed)
Spoke to pt. States that [facility] told pt they did not receive Rx for Ivabradine. PA was also approved for medication. Our records show that Rx was sent by our office to [facility] via escribe on 07/09/20. Pt asks that we send new Rx for Ivabradine and Jardiance to [facility] on [address]. Pharmacy updated in pt's chart. Rx sent for both medications to [facility] and pt appreciative for assistance. Pt has no further needs at this time.

## 2020-07-17 ENCOUNTER — Telehealth: Payer: Self-pay | Admitting: *Deleted

## 2020-07-17 NOTE — Telephone Encounter (Signed)
-----   Message from [place], LPN sent at 07/17/2977  8:38 AM EST ----- Regarding: FW: Corlanor Hi, This pt sees Dr [name] in our [place] office. Will forward this request to that office. Thanks, Jeani Hawking ----- Message ----- From: Rollen Sox, [facility] Sent: 07/16/2020   4:24 PM EST To: Deliah Boston Via, LPN Subject: Corlanor                                       Hi Jeani Hawking,  Can we start patient assistance paperwork for this lady to get her Corlanor?  Thanks!  ~Chris

## 2020-07-18 ENCOUNTER — Encounter: Payer: Self-pay | Admitting: Oncology

## 2020-07-18 NOTE — Telephone Encounter (Signed)
Corlanor patient assistance paperwork filled out and signed by provider. Office note, Chief Strategy Officer, and insurance card printout included. Left packet at front desk for patient to sign. Patient sent MyChart message with update and to request she sign at her convenience.   Loel Dubonnet, NP

## 2020-07-19 ENCOUNTER — Inpatient Hospital Stay
Admission: EM | Admit: 2020-07-19 | Discharge: 2020-07-22 | DRG: 281 | Disposition: A | Discharge status: Home / Self Care | Payer: PPO | Attending: Internal Medicine | Admitting: Internal Medicine

## 2020-07-19 ENCOUNTER — Other Ambulatory Visit: Payer: Self-pay

## 2020-07-19 ENCOUNTER — Emergency Department: Payer: PPO

## 2020-07-19 DIAGNOSIS — E039 Hypothyroidism, unspecified: Secondary | ICD-10-CM | POA: Diagnosis present

## 2020-07-19 DIAGNOSIS — K529 Noninfective gastroenteritis and colitis, unspecified: Secondary | ICD-10-CM

## 2020-07-19 DIAGNOSIS — Z91048 Other nonmedicinal substance allergy status: Secondary | ICD-10-CM

## 2020-07-19 DIAGNOSIS — T451X5A Adverse effect of antineoplastic and immunosuppressive drugs, initial encounter: Secondary | ICD-10-CM | POA: Diagnosis present

## 2020-07-19 DIAGNOSIS — I2510 Atherosclerotic heart disease of native coronary artery without angina pectoris: Secondary | ICD-10-CM | POA: Diagnosis present

## 2020-07-19 DIAGNOSIS — R079 Chest pain, unspecified: Secondary | ICD-10-CM | POA: Diagnosis not present

## 2020-07-19 DIAGNOSIS — D62 Acute posthemorrhagic anemia: Secondary | ICD-10-CM | POA: Diagnosis present

## 2020-07-19 DIAGNOSIS — Z20822 Contact with and (suspected) exposure to covid-19: Secondary | ICD-10-CM | POA: Diagnosis present

## 2020-07-19 DIAGNOSIS — Z79890 Hormone replacement therapy (postmenopausal): Secondary | ICD-10-CM

## 2020-07-19 DIAGNOSIS — I214 Non-ST elevation (NSTEMI) myocardial infarction: Secondary | ICD-10-CM | POA: Diagnosis not present

## 2020-07-19 DIAGNOSIS — Z7901 Long term (current) use of anticoagulants: Secondary | ICD-10-CM

## 2020-07-19 DIAGNOSIS — I959 Hypotension, unspecified: Secondary | ICD-10-CM

## 2020-07-19 DIAGNOSIS — I427 Cardiomyopathy due to drug and external agent: Secondary | ICD-10-CM | POA: Diagnosis present

## 2020-07-19 DIAGNOSIS — I471 Supraventricular tachycardia, unspecified: Secondary | ICD-10-CM

## 2020-07-19 DIAGNOSIS — Z87891 Personal history of nicotine dependence: Secondary | ICD-10-CM

## 2020-07-19 DIAGNOSIS — R0789 Other chest pain: Secondary | ICD-10-CM | POA: Diagnosis not present

## 2020-07-19 DIAGNOSIS — K219 Gastro-esophageal reflux disease without esophagitis: Secondary | ICD-10-CM | POA: Diagnosis present

## 2020-07-19 DIAGNOSIS — N1832 Chronic kidney disease, stage 3b: Secondary | ICD-10-CM

## 2020-07-19 DIAGNOSIS — I447 Left bundle-branch block, unspecified: Secondary | ICD-10-CM | POA: Diagnosis present

## 2020-07-19 DIAGNOSIS — I272 Pulmonary hypertension, unspecified: Secondary | ICD-10-CM | POA: Diagnosis present

## 2020-07-19 DIAGNOSIS — Z888 Allergy status to other drugs, medicaments and biological substances status: Secondary | ICD-10-CM

## 2020-07-19 DIAGNOSIS — Z807 Family history of other malignant neoplasms of lymphoid, hematopoietic and related tissues: Secondary | ICD-10-CM

## 2020-07-19 DIAGNOSIS — D72829 Elevated white blood cell count, unspecified: Secondary | ICD-10-CM | POA: Diagnosis not present

## 2020-07-19 DIAGNOSIS — C8190 Hodgkin lymphoma, unspecified, unspecified site: Secondary | ICD-10-CM | POA: Diagnosis present

## 2020-07-19 DIAGNOSIS — R42 Dizziness and giddiness: Secondary | ICD-10-CM | POA: Diagnosis not present

## 2020-07-19 DIAGNOSIS — Z955 Presence of coronary angioplasty implant and graft: Secondary | ICD-10-CM

## 2020-07-19 DIAGNOSIS — I429 Cardiomyopathy, unspecified: Secondary | ICD-10-CM

## 2020-07-19 DIAGNOSIS — R0902 Hypoxemia: Secondary | ICD-10-CM | POA: Diagnosis not present

## 2020-07-19 DIAGNOSIS — C541 Malignant neoplasm of endometrium: Secondary | ICD-10-CM | POA: Diagnosis not present

## 2020-07-19 DIAGNOSIS — E785 Hyperlipidemia, unspecified: Secondary | ICD-10-CM | POA: Diagnosis present

## 2020-07-19 DIAGNOSIS — E860 Dehydration: Secondary | ICD-10-CM | POA: Diagnosis present

## 2020-07-19 DIAGNOSIS — Z86711 Personal history of pulmonary embolism: Secondary | ICD-10-CM

## 2020-07-19 DIAGNOSIS — I739 Peripheral vascular disease, unspecified: Secondary | ICD-10-CM | POA: Diagnosis present

## 2020-07-19 DIAGNOSIS — Z79899 Other long term (current) drug therapy: Secondary | ICD-10-CM

## 2020-07-19 DIAGNOSIS — Z881 Allergy status to other antibiotic agents status: Secondary | ICD-10-CM

## 2020-07-19 DIAGNOSIS — I130 Hypertensive heart and chronic kidney disease with heart failure and stage 1 through stage 4 chronic kidney disease, or unspecified chronic kidney disease: Secondary | ICD-10-CM | POA: Diagnosis present

## 2020-07-19 DIAGNOSIS — Z66 Do not resuscitate: Secondary | ICD-10-CM | POA: Diagnosis present

## 2020-07-19 DIAGNOSIS — Z923 Personal history of irradiation: Secondary | ICD-10-CM

## 2020-07-19 DIAGNOSIS — I9589 Other hypotension: Secondary | ICD-10-CM | POA: Diagnosis present

## 2020-07-19 DIAGNOSIS — Z882 Allergy status to sulfonamides status: Secondary | ICD-10-CM

## 2020-07-19 DIAGNOSIS — I5022 Chronic systolic (congestive) heart failure: Secondary | ICD-10-CM

## 2020-07-19 DIAGNOSIS — I428 Other cardiomyopathies: Secondary | ICD-10-CM

## 2020-07-19 DIAGNOSIS — R61 Generalized hyperhidrosis: Secondary | ICD-10-CM | POA: Diagnosis not present

## 2020-07-19 DIAGNOSIS — Z9221 Personal history of antineoplastic chemotherapy: Secondary | ICD-10-CM

## 2020-07-19 HISTORY — DX: Unspecified systolic (congestive) heart failure: I50.20

## 2020-07-19 HISTORY — DX: Other cardiomyopathies: I42.8

## 2020-07-19 MED ORDER — SODIUM CHLORIDE 0.9 % IV BOLUS (SEPSIS)
1000.0000 mL | Freq: Once | INTRAVENOUS | Status: AC
  Administered 2020-07-19: 1000 mL via INTRAVENOUS

## 2020-07-19 MED ORDER — ONDANSETRON HCL 4 MG/2ML IJ SOLN
4.0000 mg | Freq: Once | INTRAMUSCULAR | Status: AC
  Administered 2020-07-19: 4 mg via INTRAVENOUS
  Filled 2020-07-19: qty 2

## 2020-07-19 NOTE — Telephone Encounter (Signed)
Thank you!!  Brigham Cobbins S [place] Leap, NP  

## 2020-07-19 NOTE — ED Triage Notes (Signed)
PT arrived to ED via EMS from home with c/o SVT. Per EMS, pt felt chest pressure and was diaphoretic around 9 PM tonight. Per EMS, gave 6 of adenosine and pt converted to ST on monitor.   Pt A&O at this time.    EMS VS: HR 200, BP 95/49, RR 12, 94% 2L.   Pt currently 100% on 2L

## 2020-07-19 NOTE — ED Notes (Signed)
Pt reports current diagnosis of cancer with less than a few months to live. Pt reports hx of SVT episodes in the past. Pt chronically on 2L [place] at night time at home.

## 2020-07-19 NOTE — Telephone Encounter (Signed)
Pt dropped off completed paperwork this morning. I have faxed to [employer] patient assistance @ [phone]. This will be placed in designated pt assistance file. I have notified pt via MyChart that paperwork has been sent and asked her to let us know when she hears from [facility] regarding approval.

## 2020-07-20 ENCOUNTER — Other Ambulatory Visit: Payer: Self-pay

## 2020-07-20 ENCOUNTER — Inpatient Hospital Stay: Payer: PPO

## 2020-07-20 ENCOUNTER — Encounter: Payer: Self-pay | Admitting: Internal Medicine

## 2020-07-20 DIAGNOSIS — K529 Noninfective gastroenteritis and colitis, unspecified: Secondary | ICD-10-CM

## 2020-07-20 DIAGNOSIS — I959 Hypotension, unspecified: Secondary | ICD-10-CM

## 2020-07-20 DIAGNOSIS — Z888 Allergy status to other drugs, medicaments and biological substances status: Secondary | ICD-10-CM | POA: Diagnosis not present

## 2020-07-20 DIAGNOSIS — C8190 Hodgkin lymphoma, unspecified, unspecified site: Secondary | ICD-10-CM | POA: Diagnosis not present

## 2020-07-20 DIAGNOSIS — I428 Other cardiomyopathies: Secondary | ICD-10-CM

## 2020-07-20 DIAGNOSIS — I214 Non-ST elevation (NSTEMI) myocardial infarction: Secondary | ICD-10-CM | POA: Diagnosis not present

## 2020-07-20 DIAGNOSIS — K661 Hemoperitoneum: Secondary | ICD-10-CM | POA: Diagnosis not present

## 2020-07-20 DIAGNOSIS — D72829 Elevated white blood cell count, unspecified: Secondary | ICD-10-CM | POA: Diagnosis not present

## 2020-07-20 DIAGNOSIS — I2510 Atherosclerotic heart disease of native coronary artery without angina pectoris: Secondary | ICD-10-CM

## 2020-07-20 DIAGNOSIS — Z7901 Long term (current) use of anticoagulants: Secondary | ICD-10-CM | POA: Diagnosis not present

## 2020-07-20 DIAGNOSIS — E860 Dehydration: Secondary | ICD-10-CM | POA: Diagnosis not present

## 2020-07-20 DIAGNOSIS — R1111 Vomiting without nausea: Secondary | ICD-10-CM | POA: Diagnosis not present

## 2020-07-20 DIAGNOSIS — E039 Hypothyroidism, unspecified: Secondary | ICD-10-CM | POA: Diagnosis not present

## 2020-07-20 DIAGNOSIS — N1832 Chronic kidney disease, stage 3b: Secondary | ICD-10-CM | POA: Diagnosis not present

## 2020-07-20 DIAGNOSIS — I471 Supraventricular tachycardia, unspecified: Secondary | ICD-10-CM

## 2020-07-20 DIAGNOSIS — I5022 Chronic systolic (congestive) heart failure: Secondary | ICD-10-CM

## 2020-07-20 DIAGNOSIS — I739 Peripheral vascular disease, unspecified: Secondary | ICD-10-CM | POA: Diagnosis not present

## 2020-07-20 DIAGNOSIS — R0789 Other chest pain: Secondary | ICD-10-CM | POA: Diagnosis not present

## 2020-07-20 DIAGNOSIS — S0990XA Unspecified injury of head, initial encounter: Secondary | ICD-10-CM | POA: Diagnosis not present

## 2020-07-20 DIAGNOSIS — S3210XA Unspecified fracture of sacrum, initial encounter for closed fracture: Secondary | ICD-10-CM | POA: Diagnosis not present

## 2020-07-20 DIAGNOSIS — Z882 Allergy status to sulfonamides status: Secondary | ICD-10-CM | POA: Diagnosis not present

## 2020-07-20 DIAGNOSIS — I429 Cardiomyopathy, unspecified: Secondary | ICD-10-CM

## 2020-07-20 DIAGNOSIS — I130 Hypertensive heart and chronic kidney disease with heart failure and stage 1 through stage 4 chronic kidney disease, or unspecified chronic kidney disease: Secondary | ICD-10-CM | POA: Diagnosis not present

## 2020-07-20 DIAGNOSIS — Z66 Do not resuscitate: Secondary | ICD-10-CM | POA: Diagnosis not present

## 2020-07-20 DIAGNOSIS — D62 Acute posthemorrhagic anemia: Secondary | ICD-10-CM | POA: Diagnosis not present

## 2020-07-20 DIAGNOSIS — C541 Malignant neoplasm of endometrium: Secondary | ICD-10-CM | POA: Diagnosis not present

## 2020-07-20 DIAGNOSIS — Z881 Allergy status to other antibiotic agents status: Secondary | ICD-10-CM | POA: Diagnosis not present

## 2020-07-20 DIAGNOSIS — I248 Other forms of acute ischemic heart disease: Secondary | ICD-10-CM | POA: Diagnosis not present

## 2020-07-20 DIAGNOSIS — I6782 Cerebral ischemia: Secondary | ICD-10-CM | POA: Diagnosis not present

## 2020-07-20 DIAGNOSIS — R55 Syncope and collapse: Secondary | ICD-10-CM | POA: Diagnosis not present

## 2020-07-20 DIAGNOSIS — T451X5A Adverse effect of antineoplastic and immunosuppressive drugs, initial encounter: Secondary | ICD-10-CM | POA: Diagnosis not present

## 2020-07-20 DIAGNOSIS — I9589 Other hypotension: Secondary | ICD-10-CM | POA: Diagnosis not present

## 2020-07-20 DIAGNOSIS — I427 Cardiomyopathy due to drug and external agent: Secondary | ICD-10-CM | POA: Diagnosis not present

## 2020-07-20 DIAGNOSIS — R61 Generalized hyperhidrosis: Secondary | ICD-10-CM | POA: Diagnosis not present

## 2020-07-20 DIAGNOSIS — Z79899 Other long term (current) drug therapy: Secondary | ICD-10-CM | POA: Diagnosis not present

## 2020-07-20 DIAGNOSIS — Z91048 Other nonmedicinal substance allergy status: Secondary | ICD-10-CM | POA: Diagnosis not present

## 2020-07-20 DIAGNOSIS — R9431 Abnormal electrocardiogram [ECG] [EKG]: Secondary | ICD-10-CM | POA: Diagnosis not present

## 2020-07-20 DIAGNOSIS — G319 Degenerative disease of nervous system, unspecified: Secondary | ICD-10-CM | POA: Diagnosis not present

## 2020-07-20 DIAGNOSIS — Z20822 Contact with and (suspected) exposure to covid-19: Secondary | ICD-10-CM | POA: Diagnosis not present

## 2020-07-20 DIAGNOSIS — K7689 Other specified diseases of liver: Secondary | ICD-10-CM | POA: Diagnosis not present

## 2020-07-20 LAB — BASIC METABOLIC PANEL
Anion gap: 12 (ref 5–15)
BUN: 18 mg/dL (ref 8–23)
CO2: 22 mmol/L (ref 22–32)
Calcium: 8.7 mg/dL — ABNORMAL LOW (ref 8.9–10.3)
Chloride: 100 mmol/L (ref 98–111)
Creatinine, Ser: 1.69 mg/dL — ABNORMAL HIGH (ref 0.44–1.00)
GFR, Estimated: 32 mL/min — ABNORMAL LOW (ref >60)
Glucose, Bld: 145 mg/dL — ABNORMAL HIGH (ref 70–99)
Potassium: 3.8 mmol/L (ref 3.5–5.1)
Sodium: 134 mmol/L — ABNORMAL LOW (ref 135–145)

## 2020-07-20 LAB — APTT
aPTT: 40 seconds — ABNORMAL HIGH (ref 24–36)
aPTT: 143 seconds — ABNORMAL HIGH (ref 24–36)

## 2020-07-20 LAB — CBC
HCT: 31.4 % — ABNORMAL LOW (ref 36.0–46.0)
HCT: 25.6 % — ABNORMAL LOW (ref 36.0–46.0)
Hemoglobin: 9.4 g/dL — ABNORMAL LOW (ref 12.0–15.0)
Hemoglobin: 8 g/dL — ABNORMAL LOW (ref 12.0–15.0)
MCH: 25.5 pg — ABNORMAL LOW (ref 26.0–34.0)
MCH: 26.1 pg (ref 26.0–34.0)
MCHC: 29.9 g/dL — ABNORMAL LOW (ref 30.0–36.0)
MCHC: 31.3 g/dL (ref 30.0–36.0)
MCV: 85.3 fL (ref 80.0–100.0)
MCV: 83.7 fL (ref 80.0–100.0)
Platelets: 291 10*3/uL (ref 150–400)
Platelets: 210 10*3/uL (ref 150–400)
RBC: 3.68 MIL/uL — ABNORMAL LOW (ref 3.87–5.11)
RBC: 3.06 MIL/uL — ABNORMAL LOW (ref 3.87–5.11)
RDW: 18.5 % — ABNORMAL HIGH (ref 11.5–15.5)
RDW: 18.4 % — ABNORMAL HIGH (ref 11.5–15.5)
WBC: 22.6 10*3/uL — ABNORMAL HIGH (ref 4.0–10.5)
WBC: 18 10*3/uL — ABNORMAL HIGH (ref 4.0–10.5)
nRBC: 0 % (ref 0.0–0.2)
nRBC: 0 % (ref 0.0–0.2)

## 2020-07-20 LAB — SARS CORONAVIRUS 2 (TAT 6-24 HRS): SARS Coronavirus 2: NEGATIVE

## 2020-07-20 LAB — PHOSPHORUS: Phosphorus: 3.8 mg/dL (ref 2.5–4.6)

## 2020-07-20 LAB — URINALYSIS, ROUTINE W REFLEX MICROSCOPIC
Bacteria, UA: NONE SEEN
Bilirubin Urine: NEGATIVE
Glucose, UA: >=500 mg/dL — AB
Ketones, ur: NEGATIVE mg/dL
Leukocytes,Ua: NEGATIVE
Nitrite: NEGATIVE
Protein, ur: NEGATIVE mg/dL
Specific Gravity, Urine: 1.006 (ref 1.005–1.030)
Squamous Epithelial / HPF: NONE SEEN (ref 0–5)
pH: 5 (ref 5.0–8.0)

## 2020-07-20 LAB — MAGNESIUM: Magnesium: 1.9 mg/dL (ref 1.7–2.4)

## 2020-07-20 LAB — TROPONIN I (HIGH SENSITIVITY)
Troponin I (High Sensitivity): 95 ng/L — ABNORMAL HIGH (ref <18)
Troponin I (High Sensitivity): 587 ng/L (ref <18)

## 2020-07-20 LAB — TSH: TSH: 2.091 u[IU]/mL (ref 0.350–4.500)

## 2020-07-20 LAB — PROTIME-INR
INR: 1.4 — ABNORMAL HIGH (ref 0.8–1.2)
Prothrombin Time: 16.6 seconds — ABNORMAL HIGH (ref 11.4–15.2)

## 2020-07-20 LAB — LACTIC ACID, PLASMA: Lactic Acid, Venous: 1.7 mmol/L (ref 0.5–1.9)

## 2020-07-20 LAB — HEPARIN LEVEL (UNFRACTIONATED)
Heparin Unfractionated: 3.14 IU/mL — ABNORMAL HIGH (ref 0.30–0.70)
Heparin Unfractionated: 2.56 IU/mL — ABNORMAL HIGH (ref 0.30–0.70)

## 2020-07-20 MED ORDER — ONDANSETRON HCL 4 MG/2ML IJ SOLN
4.0000 mg | Freq: Four times a day (QID) | INTRAMUSCULAR | Status: DC | PRN
  Administered 2020-07-21: 4 mg via INTRAVENOUS
  Filled 2020-07-20: qty 2

## 2020-07-20 MED ORDER — SODIUM CHLORIDE 0.9 % IV SOLN: INTRAVENOUS | Status: DC

## 2020-07-20 MED ORDER — VANCOMYCIN HCL IN DEXTROSE 1-5 GM/200ML-% IV SOLN
1000.0000 mg | Freq: Once | INTRAVENOUS | Status: AC
  Administered 2020-07-20: 1000 mg via INTRAVENOUS
  Filled 2020-07-20: qty 200

## 2020-07-20 MED ORDER — ASPIRIN 300 MG RE SUPP: 300.0000 mg | RECTAL | Status: DC

## 2020-07-20 MED ORDER — SODIUM CHLORIDE 0.9 % IV BOLUS
500.0000 mL | Freq: Once | INTRAVENOUS | Status: AC
  Administered 2020-07-20: 500 mL via INTRAVENOUS

## 2020-07-20 MED ORDER — ASPIRIN EC 81 MG PO TBEC
81.0000 mg | DELAYED_RELEASE_TABLET | Freq: Every day | ORAL | Status: DC
  Administered 2020-07-21 – 2020-07-22 (×2): 81 mg via ORAL
  Filled 2020-07-20 (×2): qty 1

## 2020-07-20 MED ORDER — AMIODARONE HCL IN DEXTROSE 360-4.14 MG/200ML-% IV SOLN
30.0000 mg/h | INTRAVENOUS | Status: DC
  Administered 2020-07-20 – 2020-07-21 (×2): 30 mg/h via INTRAVENOUS
  Filled 2020-07-20 (×2): qty 200

## 2020-07-20 MED ORDER — SODIUM CHLORIDE 0.9 % IV SOLN: INTRAVENOUS | Status: AC

## 2020-07-20 MED ORDER — ASPIRIN 81 MG PO CHEW: 324.0000 mg | CHEWABLE_TABLET | ORAL | Status: DC

## 2020-07-20 MED ORDER — SODIUM CHLORIDE 0.9 % IV BOLUS (SEPSIS): 1000.0000 mL | Freq: Once | INTRAVENOUS | Status: DC

## 2020-07-20 MED ORDER — ASPIRIN 81 MG PO CHEW
324.0000 mg | CHEWABLE_TABLET | Freq: Once | ORAL | Status: AC
  Administered 2020-07-20: 324 mg via ORAL
  Filled 2020-07-20: qty 4

## 2020-07-20 MED ORDER — NITROGLYCERIN 0.4 MG SL SUBL: 0.4000 mg | SUBLINGUAL_TABLET | SUBLINGUAL | Status: DC | PRN

## 2020-07-20 MED ORDER — ACETAMINOPHEN 325 MG PO TABS
650.0000 mg | ORAL_TABLET | ORAL | Status: DC | PRN
  Administered 2020-07-20: 650 mg via ORAL
  Filled 2020-07-20: qty 2

## 2020-07-20 MED ORDER — HEPARIN (PORCINE) 25000 UT/250ML-% IV SOLN
800.0000 [IU]/h | INTRAVENOUS | Status: DC
  Administered 2020-07-20: 600 [IU]/h via INTRAVENOUS

## 2020-07-20 MED ORDER — SODIUM CHLORIDE 0.9 % IV SOLN
2.0000 g | Freq: Once | INTRAVENOUS | Status: AC
  Administered 2020-07-20: 2 g via INTRAVENOUS
  Filled 2020-07-20: qty 2

## 2020-07-20 MED ORDER — ATORVASTATIN CALCIUM 10 MG PO TABS
10.0000 mg | ORAL_TABLET | Freq: Every day | ORAL | Status: DC
  Administered 2020-07-21 – 2020-07-22 (×2): 10 mg via ORAL
  Filled 2020-07-20 (×2): qty 1

## 2020-07-20 MED ORDER — HEPARIN (PORCINE) 25000 UT/250ML-% IV SOLN
750.0000 [IU]/h | INTRAVENOUS | Status: DC
  Administered 2020-07-20: 750 [IU]/h via INTRAVENOUS
  Filled 2020-07-20: qty 250

## 2020-07-20 NOTE — ED Notes (Signed)
Spoke with Dr. Dwyane Dee concerning pt's blood pressure. Pt's baseline BP is systolic of 90Z/00P. We will just continue to monitor pt at this time.

## 2020-07-20 NOTE — ED Notes (Signed)
Dr. Dwyane Dee, MD at bedside.

## 2020-07-20 NOTE — Progress Notes (Signed)
ANTICOAGULATION CONSULT NOTE - Initial Consult  Pharmacy Consult for Heparin  Indication: chest pain/ACS  Allergies  Allergen Reactions  . Metoprolol Other (See Comments)    Fatigue  . Adhesive [Tape] Other (See Comments)    Skin Irritation   . Antifungal [Miconazole Nitrate] Rash  . Sulfa Antibiotics Nausea And Vomiting and Rash  . Z-Pak [Azithromycin] Itching    Patient Measurements: Height: 5\' 1"  (154.9 cm) Weight: 63.5 kg (139 lb 15.9 oz) IBW/kg (Calculated) : 47.8 Heparin Dosing Weight:  60.9 kg   Vital Signs: Temp: 98.4 F (36.9 C) (02/05 0105) Temp Source: Rectal (02/05 0105) BP: 93/49 (02/05 0200) Pulse Rate: 84 (02/05 0200)  Labs: Recent Labs    07/19/20 2311 07/20/20 0227  HGB 9.4*  --   HCT 31.4*  --   PLT 291  --   LABPROT 16.6*  --   INR 1.4*  --   CREATININE 1.69*  --   TROPONINIHS 95* 587*    Estimated Creatinine Clearance: 26.5 mL/min (A) (by C-G formula based on SCr of 1.69 mg/dL (H)).   Medical History: Past Medical History:  Diagnosis Date  . Anxiety   . Cervicalgia   . CHF (congestive heart failure) ([place])   . Chronic kidney disease   . Coronary artery disease    a. 02/2012 Stress echo: severe anterior wall ischemia;  b. 02/2012 Cath/PCI: LAD 95p (3.0 x 15 Xience EX DES), D1 90ost (PTCA - bifurcational dzs), EF 45% with anterior HK;  b. 02/2013 Ex MV: fixed anterior defect w/ minor reversibility, nl EF-->Med Rx.  . Endometrial cancer ([place])    a. 07/2016 s/p robotic hysterectomy, BSO w/ washings, sentinel node inj, mapping, bx, adhesiolysis.  . Essential hypertension, benign   . Fibrocystic breast disease   . GERD (gastroesophageal reflux disease)   . Gestational hypertension   . Heart murmur   . History of anemia   . History of blood transfusion   . Hodgkin's lymphoma ([place]) 2011   a. s/p radiation and chemo therapy  . Osteoarthritis   . Polycystic ovarian disease     Medications:  (Not in a hospital  admission)   Assessment: Pharmacy consulted to dose heparin in this 70 year old female admitted with ACS/NSTEMI.  CrCl = 26.5 ml/min Pt was on Eliquis 2.5 mg PO BID , last dose on 2/4 @ 1900.  Goal of Therapy:  APTT = 66 - 102 sec Heparin level 0.3-0.7 units/ml Monitor platelets by anticoagulation protocol: Yes   Plan:  Pt had last dose of Eliquis on 2/4 @ 1900 so will not bolus this pt. Will start heparin drip @ 750 units/hr. Will use aPTT to guide dosing until HL and aPTT until levels correlate.  Will check CBC daily.   Baden Betsch D 07/20/2020,3:58 AM

## 2020-07-20 NOTE — ED Notes (Addendum)
Spoke with Dr. Harrell Gave, MD and Sharolyn Douglas, NP regarding the amiodarone order. Explained that pt has not been in SVT during my shift and that her blood pressure have been soft. Dr. Harrell Gave, MD stated, yes, she is baseline hypotensive and cannot tolerate any other preventative agents for her SVT. We are going to see if she tolerates amiodarone low dose and then transition to oral."

## 2020-07-20 NOTE — ED Notes (Signed)
Pt ambulatory to restroom without difficulty. Pt reconnected to monitor and covered with blankets. Pt given ice chips per request.

## 2020-07-20 NOTE — Care Plan (Signed)
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  She was found to be in SVT when EMS arrived.  She was given adenosine which aborted SVT. Patient remained hypotensive after she arrived in the ED, She was given fluid boluses.  Cardiology was consulted.  Patient cannot get CCA, ACEI or beta-blockers given reduced EF and chronic hypotension.  Cardiology started low-dose amiodarone.  Continue IV heparin for now,  would suspect demand ischemia in the setting of SVT.  Would not pursue additional ischemic work-up in the light of LHC 5 months ago.  Patient was seen and examined in the ED.  She remained asymptomatic.

## 2020-07-20 NOTE — ED Notes (Signed)
Lunch meal tray given at this time.  

## 2020-07-20 NOTE — Consult Note (Signed)
Cardiology Consult    Patient ID: HAIDY KACKLEY MRN: [id number], DOB/AGE: 12/06/1950   Admit date: 07/19/2020 Date of Consult: 07/20/2020  Primary Physician: [name], MD Primary Cardiologist: [name], MD Requesting Provider: Prudy Feeler, MD  Patient Profile    Jaime Hall is a 70 y.o. female with a history of CAD status post PCI undergoing stent placement to the proximal LAD, mixed ischemic and nonischemic cardiomyopathy with a EF of less than 20% by echo in December 2021 (felt to be secondary to chemotherapy/Adriamicin), stage III chronic kidney disease, Hodgkin's lymphoma, pulmonary embolism on Eliquis (02/2020), metastatic endometrial cancer, chronic hypotension, normocytic anemia, and GERD who is being seen today for the evaluation of palpitations, PSVT on arrival, chest pain, and HsTroponin elevation at the request of Dr. Damita Dunnings.  Past Medical History   Past Medical History:  Diagnosis Date  . Anxiety   . Cervicalgia   . Chronic kidney disease   . Coronary artery disease    a. 02/2012 Stress echo: severe anterior wall ischemia;  b. 02/2012 Cath/PCI: LAD 95p (3.0 x 15 Xience EX DES), D1 90ost (PTCA - bifurcational dzs), EF 45% with anterior HK;  c. 02/2013 Ex MV: fixed anterior defect w/ minor reversibility, nl EF-->Med Rx; d. 11/2019 MV: EF 41%, ant/antlat ischemia-->high risk scan; e. 11/2019 Cath: LM nl, LAD 40ost/p, patent LAD stent, LCX nl, RCA nl, RPDA/RPAV nl, EF 30  . Endometrial cancer ([place])    a. 07/2016 s/p robotic hysterectomy, BSO w/ washings, sentinel node inj, mapping, bx, adhesiolysis.  . Essential hypertension, benign   . Fibrocystic breast disease   . GERD (gastroesophageal reflux disease)   . Gestational hypertension   . Heart murmur   . HFrEF (heart failure with reduced ejection fraction) ([place])    a. 05/2020 Echo: EF <20%, glob HK.  Marland Kitchen History of anemia   . History of blood transfusion   . Hodgkin's lymphoma ([place]) 2011   a. s/p radiation and  chemo therapy  . Mixed Ischemic and Nonischemic Cardiomyopathy    a. 05/2020 Echo: EF <20%, glob HK, Nl RV size/fxn, mildly dil LA. Mild MR. Mild to mod Ao sclerosis.  . Osteoarthritis   . Polycystic ovarian disease     Past Surgical History:  Procedure Laterality Date  . ABDOMINAL HYSTERECTOMY    . bladder sling    . CARDIAC CATHETERIZATION  02/2012   [facility] 1 stent place  . CERVICAL POLYPECTOMY    . CHOLECYSTECTOMY  1982  . COLONOSCOPY WITH PROPOFOL N/A 02/05/2015   Procedure: COLONOSCOPY WITH PROPOFOL;  Surgeon: Lucilla Lame, MD;  Location: [facility] ENDOSCOPY;  Service: Endoscopy;  Laterality: N/A;  . CORONARY ANGIOPLASTY  02/2012   left/right s/p balloon  . CYSTOGRAM N/A 08/17/2016   Procedure: CYSTOGRAM;  Surgeon: Hollice Espy, MD;  Location: [facility] ORS;  Service: Urology;  Laterality: N/A;  . CYSTOSCOPY N/A 08/17/2016   Procedure: CYSTOSCOPY EXAM UNDER ANESTHESIA;  Surgeon: Hollice Espy, MD;  Location: [facility] ORS;  Service: Urology;  Laterality: N/A;  . CYSTOSCOPY W/ RETROGRADES Bilateral 08/17/2016   Procedure: CYSTOSCOPY WITH RETROGRADE PYELOGRAM;  Surgeon: Hollice Espy, MD;  Location: [facility] ORS;  Service: Urology;  Laterality: Bilateral;  . CYSTOSCOPY WITH STENT PLACEMENT Right 08/17/2016   Procedure: CYSTOSCOPY WITH STENT PLACEMENT;  Surgeon: Hollice Espy, MD;  Location: [facility] ORS;  Service: Urology;  Laterality: Right;  . heart stent'  2013  . kidney stent Right 2018  . LYMPH NODE BIOPSY  2011   diagnosis of  hodgkins lymphoma  . PELVIC LYMPH NODE DISSECTION N/A 07/29/2016   Procedure: PELVIC/AORTIC LYMPH NODE SAMPLING;  Surgeon: [name], MD;  Location: [facility] ORS;  Service: Gynecology;  Laterality: N/A;  . PORTA CATH INSERTION N/A 09/22/2016   Procedure: Glori Luis Cath Insertion;  Surgeon: [name], MD;  Location: [place] [facility];  Service: Cardiovascular;  Laterality: N/A;  . PORTA CATH INSERTION N/A 10/27/2019   Procedure: PORTA CATH INSERTION;  Surgeon: [name], MD;  Location: [place] [facility];  Service: Cardiovascular;  Laterality: N/A;  . PORTA CATH REMOVAL N/A 11/17/2016   Procedure: Glori Luis Cath Removal;  Surgeon: [name], MD;  Location: [place] [facility];  Service: Cardiovascular;  Laterality: N/A;  . RIGHT/LEFT HEART CATH AND CORONARY ANGIOGRAPHY N/A 12/04/2019   Procedure: RIGHT/LEFT HEART CATH AND CORONARY ANGIOGRAPHY;  Surgeon: Wellington Hampshire, MD;  Location: [place] [facility];  Service: Cardiovascular;  Laterality: N/A;  . ROBOTIC ASSISTED TOTAL HYSTERECTOMY WITH BILATERAL SALPINGO OOPHERECTOMY N/A 07/29/2016   Procedure: ROBOTIC ASSISTED TOTAL HYSTERECTOMY WITH BILATERAL SALPINGO OOPHORECTOMY;  Surgeon: [name], MD;  Location: [facility];  Service: Gynecology;  Laterality: N/A;  . SENTINEL NODE BIOPSY N/A 07/29/2016   Procedure: SENTINEL NODE BIOPSY;  Surgeon: [name], MD;  Location: [facility];  Service: Gynecology;  Laterality: N/A;  . transobturator sling N/A 2009   [place]     Allergies  Allergies  Allergen Reactions  . Metoprolol Other (See Comments)    Fatigue  . Adhesive [Tape] Other (See Comments)    Skin Irritation   . Antifungal [Miconazole Nitrate] Rash  . Sulfa Antibiotics Nausea And Vomiting and Rash  . Z-Pak [Azithromycin] Itching    History of Present Illness    70 year old female with the above complex past medical history including coronary artery disease, mixed ischemic and nonischemic cardiomyopathy, stage III chronic kidney disease, Hodgkin's lymphoma, pulmonary embolism on Eliquis, metastatic endometrial cancer, chronic hypotension, normocytic anemia, and GERD.  In the setting of Hodgkin's lymphoma, she was treated with radiation and chemotherapy to the chest area previously in remission since 2011.  She was diagnosed with coronary artery disease in September 2013 underwent PCI and drug-eluting stent placement to the proximal LAD with PTCA to the diagonal.   She subsequently had negative stress test in September 2014.  In early 2021, she was diagnosed with stage Ib high-grade serous endometrial cancer and started chemotherapy in May 2021.  An echocardiogram was performed on 11/02/2019 showing EF of 30 to 35% with grade 1 diastolic dysfunction.  She was seen in cardiology clinic in mid June and underwent diagnostic catheterization revealing patent LAD stent and otherwise nonobstructive ostial/proximal LAD disease.  She was medically managed for mixed ischemic and nonischemic cardiomyopathy.  She was managed for chest pain and presumed demand ischemia in July 2021 and was medically managed.  In September 2021, she was admitted with acute pulmonary embolism and placed on Eliquis therapy and a week later was readmitted with respiratory failure, volume overload, and hemoptysis.  Eliquis was held and hemoptysis resolved.  Eliquis was subsequently resumed in the outpatient setting by hematology/oncology.  She was most recently admitted in December 2021 with a current volume overload in the setting of not taking her Lasix as prescribed.  She was successfully diuresed.  Medical therapy was limited by soft blood pressures and carvedilol was discontinued.  She was subsequently discharged on December 10 on lasix, ivabradine, and dapagliflozin therapy.  EF during hospitalization  showed an EF of < 20%.  At office follow-up on December 17, pt noted that she could not afford ivabradine. Toprol XL 25 mg daily was added but this resulted in profound fatigue and intermittent presyncope, and thus it was discontinued.  She was provided ivabradine samples and then we were able to obtain insurance approval, so she has been taking.  She lives locally by herself.  She is accustomed to low blood pressures (80's to 90's) but generally has felt well.  Last chemo was in Nov 2021.  She is relatively sedentary but does not typically experience chest pain or dyspnea.  On Fri 1/27 she was prepping a  chicken sandwich when she had sudden presyncope w/o assoc Ss.  She sat on her floor and tried to sit up, but subsequently lost consciousness, striking her head on her kitchen floor.  She believes that she regained consciousness relatively quickly, but can't be certain.  She did not note any scalp bleeding and so did not seek care.  On Wed 2/2, she was at her sister's home in the AM, when after bfast 2 ~ 9am, she had sudden onset of sscp/heaviness assoc w/ nausea.  She then vomited x 4.  Her sister checked her BP and it was in the 90's.  Pulse ox was reported nl, as was HR.  After 3 hrs on n/v, Ss resolved following her 4th episode of emesis, while she was having a BM.  She did not seek care and felt well the remainder of 2/2 and 2/3.  On the evening of 2/4, she just laid down in bed and noted recurrent chest pressure associated w/ tachypalps.  She checked her pulse ox and though O2 sat was in the high 90's. HR was 188.  She took 4 baby ASA and HR rose into the 190's and then low 200's.  She cont to note chest heaviness w/o dyspnea/n/v/diaphoresis, but also developed lightheadedness.  She called EMS.  On arrival, she was found to be in [facility] @ in the 180's (she has chronic LBBB).  She was treated w/ adenosine in the field w/ conversion to sinus rhythm and resolution of c/p.  She was taken to the [facility] ED where ECG showed RSR, 95, LBBB.  Labs notable for WBC of 22.6, HsTrop 95  587.  CXR w/o acute findings.  BPs have been trending 70's to 90's and she is receiving NS infusion. No recurrent arrhythmia noted on tele and she is currently symptom free.  Inpatient Medications    . [START ON 07/21/2020] aspirin EC  81 mg Oral Daily  . atorvastatin  10 mg Oral Daily   Home Medications   Prior to Admission medications   Medication Sig Start Date End Date Taking? Authorizing Provider  amitriptyline (ELAVIL) 25 MG tablet Take 1 tablet (25 mg total) by mouth at bedtime. May increase weekly as needed 04/02/20   [name], MD  apixaban (ELIQUIS) 2.5 MG TABS tablet Take 1 tablet (2.5 mg total) by mouth 2 (two) times daily. 05/01/20   Sindy Guadeloupe, MD  atorvastatin (LIPITOR) 10 MG tablet Take 1 tablet by mouth once daily 10/23/19   Wellington Hampshire, MD  cholecalciferol (VITAMIN D3) 25 MCG (1000 UNIT) tablet Take 1,000 Units by mouth in the morning and at bedtime.    [provider]  cyanocobalamin (,VITAMIN B-12,) 1000 MCG/ML injection Inject 1,000 mcg into the muscle every 30 (thirty) days.    [provider]  empagliflozin (JARDIANCE) 10  MG TABS tablet Take 1 tablet (10 mg total) by mouth daily. 07/16/20   Loel Dubonnet, NP  furosemide (LASIX) 20 MG tablet Take 1 tablet (20 mg total) by mouth as needed for fluid or edema. 05/31/20   Loel Dubonnet, NP  HYDROcodone-homatropine (HYCODAN) 5-1.5 MG/5ML syrup Take 5 mLs by mouth every 6 (six) hours as needed for cough. 06/12/20   Sindy Guadeloupe, MD  ivabradine (CORLANOR) 5 MG TABS tablet Take 1 tablet (5 mg total) by mouth 2 (two) times daily with a meal. 07/16/20   Loel Dubonnet, NP  levothyroxine (SYNTHROID) 75 MCG tablet Take 1 tablet (75 mcg total) by mouth daily before breakfast. 07/15/20   Sindy Guadeloupe, MD  LORazepam (ATIVAN) 0.5 MG tablet Take 1 tablet (0.5 mg total) by mouth every 6 (six) hours as needed for anxiety (and to help with breathing). 02/01/20   Sindy Guadeloupe, MD  Morphine Sulfate (MORPHINE CONCENTRATE) 10 mg / 0.5 ml concentrated solution Take 0.25 mLs (5 mg total) by mouth every 6 (six) hours as needed for severe pain or shortness of breath. 02/27/20   Sindy Guadeloupe, MD  pantoprazole (PROTONIX) 20 MG tablet Take 1 tablet (20 mg total) by mouth daily. 05/17/20   Sindy Guadeloupe, MD  potassium chloride (KLOR-CON) 10 MEQ tablet Take 1 tablet (10 mEq total) by mouth daily as needed (Take on days that you take the Lasix.). 05/31/20   Loel Dubonnet, NP  prochlorperazine (COMPAZINE) 10 MG tablet Take 1 tablet (10 mg total)  by mouth every 6 (six) hours as needed (Nausea or vomiting). Patient taking differently: Take 10 mg by mouth daily as needed for nausea or vomiting.  10/25/19 05/15/20  Sindy Guadeloupe, MD     Family History    Family History  Problem Relation Age of Onset  . ALS Father   . Polymyositis Father   . Diabetes Brother   . Dementia Mother   . Cancer Maternal Aunt        breast  . Breast cancer Maternal Aunt        30's  . Stroke Maternal Grandmother   . Cancer Maternal Grandfather        prostate  . Stroke Maternal Grandfather   . Colon cancer Maternal Aunt   . Non-Hodgkin's lymphoma Cousin    She indicated that her mother is deceased. She indicated that her father is deceased. She indicated that her brother is deceased. She indicated that the status of her maternal grandmother is unknown. She indicated that the status of her maternal grandfather is unknown. She indicated that only one of her two maternal aunts is alive. She indicated that the status of her cousin is unknown.   Social History    Social History   Socioeconomic History  . Marital status: Widowed    Spouse name: Not on file  . Number of children: Not on file  . Years of education: Not on file  . Highest education level: Not on file  Occupational History  . Not on file  Tobacco Use  . Smoking status: Former Smoker    Packs/day: 1.00    Years: 30.00    Pack years: 30.00    Types: Cigarettes    Quit date: 07/24/2002    Years since quitting: 18.0  . Smokeless tobacco: Never Used  . Tobacco comment: quit smoking in 2000  Vaping Use  . Vaping Use: Never used  Substance and Sexual Activity  .  Alcohol use: Not Currently  . Drug use: No  . Sexual activity: Never  Other Topics Concern  . 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    Social Determinants of Health   Financial Resource Strain: Low Risk   . Difficulty of Paying  Living Expenses: Not hard at all  Food Insecurity: No Food Insecurity  . Worried About Charity fundraiser in the Last Year: Never true  . Ran Out of Food in the Last Year: Never true  Transportation Needs: No Transportation Needs  . Lack of Transportation (Medical): No  . Lack of Transportation (Non-Medical): No  Physical Activity: Unknown  . Days of Exercise per Week: 0 days  . Minutes of Exercise per Session: Not on file  Stress: No Stress Concern Present  . Feeling of Stress : Only a little  Social Connections: Unknown  . Frequency of Communication with Friends and Family: More than three times a week  . Frequency of Social Gatherings with Friends and Family: More than three times a week  . Attends Religious Services: Not on file  . Active Member of Clubs or Organizations: Not on file  . Attends Archivist Meetings: Not on file  . Marital Status: Not on file  Intimate Partner Violence: Not At Risk  . Fear of Current or Ex-Partner: No  . Emotionally Abused: No  . Physically Abused: No  . Sexually Abused: No     Review of Systems    General:  No chills, fever, night sweats or weight changes.  Cardiovascular:  +++ chest pain, no dyspnea on exertion, edema, orthopnea, +++ palpitations, paroxysmal nocturnal dyspnea, +++ presyncope w/ syncope 1 wk ago. Dermatological: No rash, lesions/masses Respiratory: No cough, dyspnea Urologic: No hematuria, dysuria Abdominal:   +++ nausea,+++ vomiting - 2/2, no diarrhea, bright red blood per rectum, melena, or hematemesis Neurologic:  No visual changes, wkns, changes in mental status. All other systems reviewed and are otherwise negative except as noted above.  Physical Exam    Blood pressure (!) 77/43, pulse 74, temperature 98.4 F (36.9 C), temperature source Rectal, resp. rate 20, height 5\' 1"  (1.549 m), weight 63.5 kg, SpO2 100 %.  General: Pleasant, NAD Psych: Normal affect. Neuro: Alert and oriented X 3. Moves all  extremities spontaneously. HEENT: Normal  Neck: Supple without bruits or JVD. Lungs:  Resp regular and unlabored, CTA. Heart: RRR no s3, s4, or murmurs. Abdomen: Soft, non-tender, non-distended, BS + x 4.  Extremities: No clubbing, cyanosis or edema. DP/PT 1+, Radials 2+ and equal bilaterally.  Labs    Cardiac Enzymes Recent Labs  Lab 07/19/20 2311 07/20/20 0227  TROPONINIHS 95* 587*      Lab Results  Component Value Date   WBC 18.0 (H) 07/20/2020   HGB 8.0 (L) 07/20/2020   HCT 25.6 (L) 07/20/2020   MCV 83.7 07/20/2020   PLT 210 07/20/2020    Recent Labs  Lab 07/19/20 2311  NA 134*  K 3.8  CL 100  CO2 22  BUN 18  CREATININE 1.69*  CALCIUM 8.7*  GLUCOSE 145*   Lab Results  Component Value Date   CHOL 114 12/30/2019   HDL 47 12/30/2019   LDLCALC 41 12/30/2019   TRIG 131 12/30/2019     Radiology Studies    CT ABDOMEN PELVIS WO CONTRAST  Result Date: 07/20/2020 CLINICAL DATA:  Nausea and vomiting.  EXAM: CT ABDOMEN AND PELVIS WITHOUT CONTRAST TECHNIQUE: Multidetector CT imaging of the abdomen and pelvis was performed following the standard protocol without IV contrast. COMPARISON:  05/29/2020 FINDINGS: Lower chest: Low-density blood pool suggesting anemia. No acute finding. Hepatobiliary: Parenchymal mass at the right upper lobe which has enlarged to 4.4 cm compared to 3.5 cm previously. There are additional liver surface masses along the right liver attributed to patient's peritoneal disease, progressed. New metastatic disease at the central fissures without visible ductal obstruction. Cholecystectomy. Pancreas: Generalized atrophy and fatty infiltration. Spleen: Unremarkable. Adrenals/Urinary Tract: Negative adrenals. No hydronephrosis or stone. Right lower renal cystic densities. Unremarkable collapsed bladder. Stomach/Bowel:  No obstruction. No visible bowel inflammation Vascular/Lymphatic: No acute vascular abnormality. No noted retroperitoneal adenopathy. Aortic  atherosclerosis. Reproductive:Hysterectomy. Other: Peritoneal metastatic disease with multiple nodules and masses especially along the upper abdominal cavity. These have increased in number and size, with increase especially seen around the liver and left upper quadrant. Musculoskeletal: Chronic pathologic sacral fractures with unchanged alignment. Lumbar spine degeneration with L4-5 anterolisthesis. IMPRESSION: Progressive hepatic and peritoneal metastatic disease but no bowel obstruction or visible inflammation to specifically correlate with history of nausea and vomiting. Electronically Signed   By: Monte Fantasia M.D.   On: 07/20/2020 05:52   DG Chest 2 View  Result Date: 07/19/2020 CLINICAL DATA:  SVT, chest pressure, diaphoresis, history of endometrial cancer EXAM: CHEST - 2 VIEW COMPARISON:  05/19/2020, 05/29/2020 FINDINGS: Frontal and lateral views of the chest demonstrate stable right chest wall port. Cardiac silhouette is unremarkable. Post radiation changes are seen along the hilar margins, stable. No airspace disease, effusion, or pneumothorax. No acute bony abnormalities. IMPRESSION: 1. No acute intrathoracic process. Electronically Signed   By: Randa Ngo M.D.   On: 07/19/2020 23:41    ECG & Cardiac Imaging    RSR, 95, LBBB - personally reviewed.  Assessment & Plan    1.  PSVT:  On evening of 2/4, she noted sudden onset of chest tightness assoc w/ tachypalpitations and subsequently presyncope.  Upon EMS arrival, she was noted to be in [facility]  SVT w/ underlying LBBB, which responded/broke w/ 6mg  IV adenosine.  With this, she had resolution of chest tightness.  No recurrent arrhythmia in ED thus far.  Known severe LV dysfxn w/ EF < 20.  Prev did not tolerate low-dose metoprolol 2/2 hypotension, presyncope, and fatigue, resulting in it being discontinued last month.  Difficult situation.  Options for mgmt potentially include oral antiarrhythmic therapy, though options limited to LV  dysfxn/CAD hx, versus EP eval for consideration of catheter ablation.  2.  Demand Ischemia/CAD:  H/o LAD stenting in 2013 w/ cath in 12/2019 showing patent LAD stent and nonobs ost/prox LAD dzs.  Otw nl cors.  She had episodes of chest pressure on 2/2 in the setting of n/v x 3 hrs, and then again on the evening of 2/4, in the setting of SVT.  She does not typically experience c/p or dyspnea w/ usual activities around the house.  HsTrop moderately elevated in the setting of above @ 95  587.  She is currently symptom free.  ECG w/ chronic LBBB.  She is on chronic eliquis and this is on hold pending further enzymatic evaluation.  Agree w/ IV heparin for now.  Provided HsTrops do not rise significantly further, would suspect demand ischemia in the setting of tachycardia, and would not pursue additional ischemic evaluation in light of cath 5 months ago.  Cont asa and statin.  No  blocker in the setting of acute on chronic hypotension.    3.  HFrEF/Mixed ICM/NICM: EF < 20% by echo in 05/2020.  She has been doing well @ home from a volume standpoint and is euvolemic this AM.  She is currently receiving IVF in the setting of pressures in the 70's to 80's.  Will have to be careful w/ excess volume given.  Cont corlanor and empagliflozin.  GDMT otw not feasible in setting of hypotension preventing utilization of  blocker, acei/arb/arni/mra.  4.   Hypotension:  Chronic but worse than usual in ED.  She's accustomed to pressures in the 80's to 90's but has had some pressures in the 70's in the ED.  S/p IVF bolus.  Will need to watch for volume excess.  Trying to avoid pressors as she is asymptomatic.  5.  Presyncope/Syncope:  Pt has a h/o presyncope when on metoprolol therapy previously.  She had a syncopal spell last week that occurred while prepping lunch.  She became acutely lightheaded and after sitting on the floor, she lost consciousness for an undetermined amount of time.  She says that she did strike her head on  the kitchen floor, but did not seek care.  In light of chronic anticoagulation, she should have a head CT to r/o bleed.  Differential for presyncope/syncope is includes acute on chronic hypotension, SVT, or VT - in the setting of low EF.  She believes that she was dehydrated.  Watch tele.  Will likely plan to place zio @ discharge to assess for additional arrhythmias.  6.  CKD III:  Creat stable.    7.  Leukocytosis:  WBC 22.6 on arrival, 18 this AM.  Afebrile.  CXR, abd/pelvic CT w/o acute findings. COVID pending - no resp Ss.  8.  Normocytic anemia:  Stable.  9.  HL:  LDL 41.  Cont statin rx.  Signed, Murray Hodgkins, NP 07/20/2020, 8:07 AM  For questions or updates, please contact   Please consult [email or web address] for contact info under Cardiology/STEMI.

## 2020-07-20 NOTE — H&P (Signed)
History and Physical    Jaime Hall [id number] DOB: Jun 03, 1951 DOA: 07/19/2020  PCP: [name], MD   Patient coming from: home  I have personally briefly reviewed patient's old medical records in [place]  Chief Complaint: chest pressure  HPI: Jaime Hall is a 70 y.o. 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  Chest pressure was of moderate intensity, retrosternal, nonradiating with no aggravating or alleviating factors.  Patient reported having vomiting and frequent soft stool in the 2 days prior to onset.  She denies fever or chills.  Denies abdominal pain.  Her oral intake is decreased but she said this is her baseline due to her endometrial carcinoma.  Denies cough or dysuria.  Vomiting was nonbloody nonbilious and without coffee grounds.  Denied blood in the stool or melena.  On arrival of EMS she was found to be in SVT with a rate of 200, aborted with 6 mg adenosine to sinus tachycardia. ED Course: Upon arrival, patient was in sinus tach at 103, afebrile, initial BP 121/107, O2 sat 100% on 2 L.  While in the ER blood pressure trended down to as low as 82/39, but responded to 1 L fluid bolus with improvement 93/50 by admission.  Blood work significant for troponin 95>587.  Leukocytosis of 22,000, hemoglobin at baseline at 9.4.  TSH WNL.  Creatinine at baseline at 1.69. EKG as reviewed by me : Sinus at 95 with left bundle branch block, unchanged Imaging: Chest x-ray with no acute findings  ED provider spoke with cardiologist Dr. Johnsie Cancel who recommended continuous fluids for hypotension.  No need for pressors.   Hospitalist consulted for admission.  Review of Systems: As per HPI otherwise all other systems on review of systems negative.    Past Medical History:  Diagnosis Date  . Anxiety   . Cervicalgia   . CHF (congestive heart failure) ([place])   . Chronic kidney disease   . Coronary artery disease    a. 02/2012 Stress echo: severe anterior wall ischemia;  b. 02/2012 Cath/PCI: LAD 95p (3.0 x 15 Xience EX DES), D1 90ost (PTCA - bifurcational dzs), EF 45% with anterior HK;  b. 02/2013 Ex MV: fixed anterior defect w/ minor reversibility, nl EF-->Med Rx.  . Endometrial cancer ([place])    a. 07/2016 s/p robotic hysterectomy, BSO w/ washings, sentinel node inj, mapping, bx, adhesiolysis.  . Essential hypertension, benign   . Fibrocystic breast disease   . GERD (gastroesophageal reflux disease)   . Gestational hypertension   . Heart murmur   . History of anemia   . History of blood transfusion   . Hodgkin's lymphoma ([place]) 2011   a. s/p radiation and chemo therapy  . Osteoarthritis   . Polycystic ovarian disease     Past Surgical History:  Procedure Laterality Date  . ABDOMINAL HYSTERECTOMY    . bladder sling    . CARDIAC CATHETERIZATION  02/2012   [facility] 1 stent place  . CERVICAL POLYPECTOMY    . CHOLECYSTECTOMY  1982  . COLONOSCOPY WITH PROPOFOL N/A 02/05/2015   Procedure: COLONOSCOPY WITH PROPOFOL;  Surgeon: Lucilla Lame, MD;  Location: [facility]COPY;  Service: Endoscopy;  Laterality: N/A;  . CORONARY ANGIOPLASTY  02/2012   left/right s/p balloon  . CYSTOGRAM N/A 08/17/2016   Procedure: CYSTOGRAM;  Surgeon: Hollice Espy, MD;  Location: [facility] ORS;  Service: Urology;  Laterality: N/A;  . CYSTOSCOPY N/A 08/17/2016   Procedure: CYSTOSCOPY EXAM UNDER ANESTHESIA;  Surgeon: Hollice Espy, MD;  Location: [facility] ORS;  Service: Urology;  Laterality: N/A;  . CYSTOSCOPY W/ RETROGRADES Bilateral 08/17/2016   Procedure: CYSTOSCOPY WITH RETROGRADE PYELOGRAM;  Surgeon: Hollice Espy, MD;  Location: [facility] OR[facility];  Service:  Urology;  Laterality: Bilateral;  . CYSTOSCOPY WITH STENT PLACEMENT Right 08/17/2016   Procedure: CYSTOSCOPY WITH STENT PLACEMENT;  Surgeon: Hollice Espy, MD;  Location: [facility] OR[facility];  Service: Urology;  Laterality: Right;  . heart stent'  2013  . kidney stent Right 2018  . LYMPH NODE BIOPSY  2011   diagnosis of hodgkins lymphoma  . PELVIC LYMPH NODE DISSECTION N/A 07/29/2016   Procedure: PELVIC/AORTIC LYMPH NODE SAMPLING;  Surgeon: [name], MD;  Location: [facility] ORS;  Service: Gynecology;  Laterality: N/A;  . PORTA CATH INSERTION N/A 09/22/2016   Procedure: Glori Luis Cath Insertion;  Surgeon: [name], MD;  Location: [place] [facility];  Service: Cardiovascular;  Laterality: N/A;  . PORTA CATH INSERTION N/A 10/27/2019   Procedure: PORTA CATH INSERTION;  Surgeon: [name], MD;  Location: [place] [facility];  Service: Cardiovascular;  Laterality: N/A;  . PORTA CATH REMOVAL N/A 11/17/2016   Procedure: Glori Luis Cath Removal;  Surgeon: [name], MD;  Location: [facility] [facility];  Service: Cardiovascular;  Laterality: N/A;  . RIGHT/LEFT HEART CATH AND CORONARY ANGIOGRAPHY N/A 12/04/2019   Procedure: RIGHT/LEFT HEART CATH AND CORONARY ANGIOGRAPHY;  Surgeon: Wellington Hampshire, MD;  Location: [place] [facility];  Service: Cardiovascular;  Laterality: N/A;  . ROBOTIC ASSISTED TOTAL HYSTERECTOMY WITH BILATERAL SALPINGO OOPHERECTOMY N/A 07/29/2016   Procedure: ROBOTIC ASSISTED TOTAL HYSTERECTOMY WITH BILATERAL SALPINGO OOPHORECTOMY;  Surgeon: [name], MD;  Location: [facility];  Service: Gynecology;  Laterality: N/A;  . SENTINEL NODE BIOPSY N/A 07/29/2016   Procedure: SENTINEL NODE BIOPSY;  Surgeon: [name], MD;  Location: [facility];  Service: Gynecology;  Laterality: N/A;  . transobturator sling N/A 2009   [place]     reports that she quit smoking about 18 years ago. Her smoking use included cigarettes. She has a 30.00 pack-year smoking  history. She has never used smokeless tobacco. She reports previous alcohol use. She reports that she does not use drugs.  Allergies  Allergen Reactions  . Metoprolol Other (See Comments)    Fatigue  . Adhesive [Tape] Other (See Comments)    Skin Irritation   . Antifungal [Miconazole Nitrate] Rash  . Sulfa Antibiotics Nausea And Vomiting and Rash  . Z-Pak [Azithromycin] Itching    Family History  Problem Relation Age of Onset  . ALS Father   . Polymyositis Father   . Diabetes Brother   . Dementia Mother   . Cancer Maternal Aunt        breast  . Breast cancer Maternal Aunt        30's  . Stroke Maternal Grandmother   . Cancer Maternal Grandfather        prostate  . Stroke Maternal Grandfather   . Colon cancer Maternal Aunt   . Non-Hodgkin's lymphoma Cousin       Prior to Admission medications   Medication Sig Start Date End Date Taking? Authorizing Provider  amitriptyline (ELAVIL) 25 MG tablet Take 1 tablet (25 mg  total) by mouth at bedtime. May increase weekly as needed 04/02/20   [name], MD  apixaban (ELIQUIS) 2.5 MG TABS tablet Take 1 tablet (2.5 mg total) by mouth 2 (two) times daily. 05/01/20   Sindy Guadeloupe, MD  atorvastatin (LIPITOR) 10 MG tablet Take 1 tablet by mouth once daily 10/23/19   Wellington Hampshire, MD  cholecalciferol (VITAMIN D3) 25 MCG (1000 UNIT) tablet Take 1,000 Units by mouth in the morning and at bedtime.    [provider]  cyanocobalamin (,VITAMIN B-12,) 1000 MCG/ML injection Inject 1,000 mcg into the muscle every 30 (thirty) days.    [provider]  empagliflozin (JARDIANCE) 10 MG TABS tablet Take 1 tablet (10 mg total) by mouth daily. 07/16/20   Loel Dubonnet, NP  furosemide (LASIX) 20 MG tablet Take 1 tablet (20 mg total) by mouth as needed for fluid or edema. 05/31/20   Loel Dubonnet, NP  HYDROcodone-homatropine (HYCODAN) 5-1.5 MG/5ML syrup Take 5 mLs by mouth every 6 (six) hours as needed for cough. 06/12/20    Sindy Guadeloupe, MD  ivabradine (CORLANOR) 5 MG TABS tablet Take 1 tablet (5 mg total) by mouth 2 (two) times daily with a meal. 07/16/20   Loel Dubonnet, NP  levothyroxine (SYNTHROID) 75 MCG tablet Take 1 tablet (75 mcg total) by mouth daily before breakfast. 07/15/20   Sindy Guadeloupe, MD  LORazepam (ATIVAN) 0.5 MG tablet Take 1 tablet (0.5 mg total) by mouth every 6 (six) hours as needed for anxiety (and to help with breathing). 02/01/20   Sindy Guadeloupe, MD  Morphine Sulfate (MORPHINE CONCENTRATE) 10 mg / 0.5 ml concentrated solution Take 0.25 mLs (5 mg total) by mouth every 6 (six) hours as needed for severe pain or shortness of breath. 02/27/20   Sindy Guadeloupe, MD  pantoprazole (PROTONIX) 20 MG tablet Take 1 tablet (20 mg total) by mouth daily. 05/17/20   Sindy Guadeloupe, MD  potassium chloride (KLOR-CON) 10 MEQ tablet Take 1 tablet (10 mEq total) by mouth daily as needed (Take on days that you take the Lasix.). 05/31/20   Loel Dubonnet, NP  prochlorperazine (COMPAZINE) 10 MG tablet Take 1 tablet (10 mg total) by mouth every 6 (six) hours as needed (Nausea or vomiting). Patient taking differently: Take 10 mg by mouth daily as needed for nausea or vomiting.  10/25/19 05/15/20  Sindy Guadeloupe, MD    Physical Exam: Vitals:   07/20/20 0105 07/20/20 0200 07/20/20 0300 07/20/20 0400  BP: (!) 82/39 (!) 93/49 (!) 81/42 (!) 86/35  Pulse: 86 84 81 81  Resp: (!) 21 18 15  (!) 21  Temp: 98.4 F (36.9 C)     TempSrc: Rectal     SpO2: 98% 100% 99% 99%  Weight:      Height:         Vitals:   07/20/20 0105 07/20/20 0200 07/20/20 0300 07/20/20 0400  BP: (!) 82/39 (!) 93/49 (!) 81/42 (!) 86/35  Pulse: 86 84 81 81  Resp: (!) 21 18 15  (!) 21  Temp: 98.4 F (36.9 C)     TempSrc: Rectal     SpO2: 98% 100% 99% 99%  Weight:      Height:          Constitutional: Alert and oriented x 3 . Not in any apparent distress HEENT:      Head: Normocephalic and atraumatic.         Eyes: PERLA, EOMI,  Conjunctivae are normal. Sclera is non-icteric.       Mouth/Throat: Mucous membranes are moist.       Neck: Supple with no signs of meningismus. Cardiovascular: Regular rate and rhythm,. No murmurs, gallops, or rubs. 2+ symmetrical distal pulses are present . No JVD. No LE edema Respiratory: Respiratory effort normal .Lungs sounds clear bilaterally. No wheezes, crackles, or rhonchi.  Gastrointestinal: Soft, non tender, and non distended with positive bowel sounds.  Genitourinary: No CVA tenderness. Musculoskeletal: Nontender with normal range of motion in all extremities. No cyanosis, or erythema of extremities. Neurologic:  Face is symmetric. Moving all extremities. No gross focal neurologic deficits . Skin: Skin is warm, dry.  No rash or ulcers Psychiatric: Mood and affect are normal    Labs on Admission: I have personally reviewed following labs and imaging studies  CBC: Recent Labs  Lab 07/19/20 2311 07/20/20 0400  WBC 22.6* 18.0*  HGB 9.4* 8.0*  HCT 31.4* 25.6*  MCV 85.3 83.7  PLT 291 245   Basic Metabolic Panel: Recent Labs  Lab 07/19/20 2311  NA 134*  K 3.8  CL 100  CO2 22  GLUCOSE 145*  BUN 18  CREATININE 1.69*  CALCIUM 8.7*  MG 1.9  PHOS 3.8   GFR: Estimated Creatinine Clearance: 26.5 mL/min (A) (by C-G formula based on SCr of 1.69 mg/dL (H)). Liver Function Tests: No results for input(s): AST, ALT, ALKPHOS, BILITOT, PROT, ALBUMIN in the last 168 hours. No results for input(s): LIPASE, AMYLASE in the last 168 hours. No results for input(s): AMMONIA in the last 168 hours. Coagulation Profile: Recent Labs  Lab 07/19/20 2311  INR 1.4*   Cardiac Enzymes: No results for input(s): CKTOTAL, CKMB, CKMBINDEX, TROPONINI in the last 168 hours. BNP (last 3 results) No results for input(s): PROBNP in the last 8760 hours. HbA1C: No results for input(s): HGBA1C in the last 72 hours. CBG: No results for input(s): GLUCAP in the last 168 hours. Lipid Profile: No  results for input(s): CHOL, HDL, LDLCALC, TRIG, CHOLHDL, LDLDIRECT in the last 72 hours. Thyroid Function Tests: Recent Labs    07/19/20 2311  TSH 2.091   Anemia Panel: No results for input(s): VITAMINB12, FOLATE, FERRITIN, TIBC, IRON, RETICCTPCT in the last 72 hours. Urine analysis:    Component Value Date/Time   COLORURINE YELLOW (A) 05/09/2020 0031   APPEARANCEUR CLOUDY (A) 05/09/2020 0031   APPEARANCEUR Clear 09/24/2016 0945   LABSPEC 1.016 05/09/2020 0031   PHURINE 5.0 05/09/2020 0031   GLUCOSEU NEGATIVE 05/09/2020 0031   GLUCOSEU NEGATIVE 07/07/2016 1418   HGBUR SMALL (A) 05/09/2020 0031   BILIRUBINUR NEGATIVE 05/09/2020 0031   BILIRUBINUR negative 10/02/2019 1627   BILIRUBINUR Negative 09/24/2016 0945   KETONESUR NEGATIVE 05/09/2020 0031   PROTEINUR 30 (A) 05/09/2020 0031   UROBILINOGEN 0.2 10/02/2019 1627   UROBILINOGEN 0.2 07/07/2016 1418   NITRITE NEGATIVE 05/09/2020 0031   LEUKOCYTESUR LARGE (A) 05/09/2020 0031    Radiological Exams on Admission: DG Chest 2 View  Result Date: 07/19/2020 CLINICAL DATA:  SVT, chest pressure, diaphoresis, history of endometrial cancer EXAM: CHEST - 2 VIEW COMPARISON:  05/19/2020, 05/29/2020 FINDINGS: Frontal and lateral views of the chest demonstrate stable right chest wall port. Cardiac silhouette is unremarkable. Post radiation changes are seen along the hilar margins, stable. No airspace disease, effusion, or pneumothorax. No acute bony abnormalities. IMPRESSION: 1. No acute intrathoracic process. Electronically Signed   By: Randa Ngo M.D.   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  With 2-day history of vomiting and diarrhea.  EMS recorded SVT with a rate of 200, aborted with 6 mg adenosine to sinus tachycardia.    SVT (supraventricular tachycardia) with history of SVT -Patient had history of SVT during her cardiology visit in December 2021 associated with hypotension -Has not been on rate control agents due to baseline hypotension -Episode aborted with adenosine administered by EMS -Continuous cardiac monitoring -Cardiology consult    NSTEMI versus demand ischemia   History of CAD -Patient was symptomatic for chest pressure and shortness of breath, resolving following treatment of SVT -Troponin 95> 587.  EKG with LBBB -Continue to trend troponin to peak -Heparin infusion, statin.  Unable to tolerate beta-blocker due to hypotension -Echocardiogram to evaluate for segmental wall motion abnormality -Cardiology consult     Hypotension -Suspect related to dehydration from recent vomiting and diarrhea superimposed on chronic hypotension -Some suspicion for sepsis given leukocytosis tachycardia post adenosine for SVT -Responded to IV fluid bolus in the emergency room -Continue IV infusion of an additional liter with close monitoring for fluid overload in view of systolic heart failure -Low threshold for deciding on sepsis diagnosis  Vomiting and diarrhea suspect acute gastroenteritis -Patient had 2 days of vomiting and loose stool has leukocytosis of 22,000, lactic acid 1.7 which per patient improved over the last 12 to 24 hours -Follow CT abdomen and pelvis -Was given cefepime and vancomycin in the emergency room -IV hydration and supportive care for now -Antibiotics not continued at this time, given symptom improvement but low threshold for continuation of antibiotics for GI source given meeting sepsis criteria -Follow urinalysis   Chronic systolic CHF (congestive heart failure) (HCC)   NICM (nonischemic cardiomyopathy),  chemotherapy-induced -Appears euvolemic to a bit dry -Chest x-ray clear -Due to hypotension unable to tolerate beta-blocker, ACE/ARB -Last EF in December 2021 less than 20% -Follow repeat echo      Endometrial cancer ([place]) -Follow CT abdomen and pelvis in view of recent vomiting and diarrhea    Stage 3b chronic kidney disease (HCC) -Creatinine at baseline    Hypothyroidism -Continue levothyroxine    DVT prophylaxis:   Code Status: DNR  Family Communication:  none  Disposition Plan: Back to previous home environment Consults called: Cardiology Status:At the time of admission, it appears that the appropriate admission status for this patient is INPATIENT. This is judged to be reasonable and necessary in order to provide the required intensity of service to ensure the patient's safety given the presenting symptoms, physical exam findings, and initial radiographic and laboratory data in the context of their  Comorbid conditions.   Patient requires inpatient status due to high intensity of service, high risk for further deterioration and high frequency of surveillance required.   I certify that at the point of admission it is my clinical judgment that the patient will require inpatient hospital care spanning beyond [place] MD [facility]     07/20/2020, 5:26 AM

## 2020-07-20 NOTE — ED Notes (Signed)
Breakfast meal tray given 

## 2020-07-20 NOTE — Plan of Care (Signed)
Patient profile completed. Skin intact. Patient has shoulder pain from a fall she had last week. Patient is in agreement with plan of care

## 2020-07-20 NOTE — Progress Notes (Signed)
[place] for Heparin  Indication: chest pain/ACS  Allergies  Allergen Reactions  . Metoprolol Other (See Comments)    Fatigue  . Adhesive [Tape] Other (See Comments)    Skin Irritation   . Antifungal [Miconazole Nitrate] Rash  . Sulfa Antibiotics Nausea And Vomiting and Rash  . Z-Pak [Azithromycin] Itching    Patient Measurements: Height: 5\' 1"  (154.9 cm) Weight: 63.5 kg (139 lb 15.9 oz) IBW/kg (Calculated) : 47.8 Heparin Dosing Weight:  60.9 kg   Vital Signs: BP: 102/57 (02/05 1200) Pulse Rate: 78 (02/05 1200)  Labs: Recent Labs    07/19/20 2311 07/20/20 0227 07/20/20 0400 07/20/20 1237  HGB 9.4*  --  8.0*  --   HCT 31.4*  --  25.6*  --   PLT 291  --  210  --   APTT  --   --  40* 143*  LABPROT 16.6*  --   --   --   INR 1.4*  --   --   --   HEPARINUNFRC  --   --  3.14* 2.56*  CREATININE 1.69*  --   --   --   TROPONINIHS 95* 587*  --   --     Estimated Creatinine Clearance: 26.5 mL/min (A) (by C-G formula based on SCr of 1.69 mg/dL (H)).   Medical History: Past Medical History:  Diagnosis Date  . Anxiety   . Cervicalgia   . Chronic kidney disease   . Coronary artery disease    a. 02/2012 Stress echo: severe anterior wall ischemia;  b. 02/2012 Cath/PCI: LAD 95p (3.0 x 15 Xience EX DES), D1 90ost (PTCA - bifurcational dzs), EF 45% with anterior HK;  c. 02/2013 Ex MV: fixed anterior defect w/ minor reversibility, nl EF-->Med Rx; d. 11/2019 MV: EF 41%, ant/antlat ischemia-->high risk scan; e. 11/2019 Cath: LM nl, LAD 40ost/p, patent LAD stent, LCX nl, RCA nl, RPDA/RPAV nl, EF 30  . Endometrial cancer ([place])    a. 07/2016 s/p robotic hysterectomy, BSO w/ washings, sentinel node inj, mapping, bx, adhesiolysis.  . Essential hypertension, benign   . Fibrocystic breast disease   . GERD (gastroesophageal reflux disease)   . Gestational hypertension   . Heart murmur   . HFrEF (heart failure with reduced ejection fraction) ([place])    a.  05/2020 Echo: EF <20%, glob HK.  [place] Kitchen History of anemia   . History of blood transfusion   . Hodgkin's lymphoma ([place]) 2011   a. s/p radiation and chemo therapy  . Mixed Ischemic and Nonischemic Cardiomyopathy    a. 05/2020 Echo: EF <20%, glob HK, Nl RV size/fxn, mildly dil LA. Mild MR. Mild to mod Ao sclerosis.  . Osteoarthritis   . Polycystic ovarian disease     Medications:  (Not in a hospital admission)   Assessment: Pharmacy consulted to dose heparin in this 70 year old female admitted with ACS/NSTEMI.  CrCl = 26.5 ml/min Pt was on Eliquis 2.5 mg PO BID , last dose on 2/4 @ 1900.  2/5 0400 HL 3.14 aPTT 40 (baseline) 2/5 1237 HL 2.56 aPTT 143 - will hold heparin for 1 hours and restart at a lower dose.   Goal of Therapy:  APTT = 66 - 102 sec Heparin level 0.3-0.7 units/ml, once aPTT and HL correlate.  Monitor platelets by anticoagulation protocol: Yes   Plan:  Heparin level is falsely elevated due to apixaban and aPTT is supratherapeutic. Will hold heparin for 1 hour and restart  heparin infusion at 600 units/hr. Recheck aPTT in 8 hours. CBC daily while on heparin.  Will use aPTT to guide dosing until HL and aPTT until levels correlate.     Oswald Hillock 07/20/2020,1:20 PM

## 2020-07-20 NOTE — ED Provider Notes (Signed)
[facility] Emergency Department Provider Note  ____________________________________________   Event Date/Time   First MD Initiated Contact with Patient 07/19/20 2311     (approximate)  I have reviewed the triage vital signs and the nursing notes.   HISTORY  Chief Complaint No chief complaint on file.    HPI Jaime Hall is a 70 y.o. female with history of CAD, chemo induced cardiomyopathy, recurrent endometrial cancer with metastasis who presents to the emergency department with EMS after she started having palpitations, chest tightness, shortness of breath tonight.  EMS found patient in SVT.  Gave 6 mg of adenosine and she has now converted.  She is not sure if she has a previous history of SVT.  States she is followed by Dr. [name] with cardiology.  She is on Corlanor.    States she ended chemotherapy in July 2021.  Reports ended immunotherapy in December 2021.  No recent Neulasta injections.  States she has been told she has 6 months to live.  She denies to me any recent fevers, cough.  Reports to the hospitalist that she recently had nausea, vomiting and diarrhea that has resolved.  No current pain.    Past Medical History:  Diagnosis Date  . Anxiety   . Cervicalgia   . CHF (congestive heart failure) ([place])   . Chronic kidney disease   . Coronary artery disease    a. 02/2012 Stress echo: severe anterior wall ischemia;  b. 02/2012 Cath/PCI: LAD 95p (3.0 x 15 Xience EX DES), D1 90ost (PTCA - bifurcational dzs), EF 45% with anterior HK;  b. 02/2013 Ex MV: fixed anterior defect w/ minor reversibility, nl EF-->Med Rx.  . Endometrial cancer ([place])    a. 07/2016 s/p robotic hysterectomy, BSO w/ washings, sentinel node inj, mapping, bx, adhesiolysis.  . Essential hypertension, benign   . Fibrocystic breast disease   . GERD (gastroesophageal reflux disease)   . Gestational hypertension   . Heart murmur   . History of anemia   . History of blood  transfusion   . Hodgkin's lymphoma ([place]) 2011   a. s/p radiation and chemo therapy  . Osteoarthritis   . Polycystic ovarian disease     Patient Active Problem List   Diagnosis Date Noted  . Chronic systolic CHF (congestive heart failure) ([place]) 07/20/2020  . SVT (supraventricular tachycardia) ([place]) 07/20/2020  . NICM (nonischemic cardiomyopathy) ([place]) 07/20/2020  . Hypotension 07/20/2020  . NSTEMI (non-ST elevated myocardial infarction) ([place]) 07/20/2020  . Acute gastroenteritis 07/20/2020  . Acute on chronic combined systolic and diastolic CHF (congestive heart failure) ([place]) 05/19/2020  . PE (pulmonary thromboembolism) ([place]) 05/19/2020  . GERD (gastroesophageal reflux disease) 05/19/2020  . UTI (urinary tract infection) 05/19/2020  . Hospital discharge follow-up 03/02/2020  . Hemoptysis 02/20/2020  . Acute on chronic congestive heart failure ([place])   . Palliative care encounter   . Acute pulmonary embolism ([place]) 02/14/2020  . Thrombocytopenia ([place]) 02/14/2020  . Hypothyroidism 02/14/2020  . Chest pain 12/29/2019  . Leukocytosis 12/29/2019  . HTN (hypertension) 12/29/2019  . HLD (hyperlipidemia) 12/29/2019  . Elevated troponin 12/29/2019  . Stage 3b chronic kidney disease ([place]) 12/29/2019  . Depression with anxiety 12/29/2019  . Abnormal cardiovascular stress test   . Dyspnea on exertion   . Goals of care, counseling/discussion 10/20/2019  . 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 Peritoneal metastases ([place]) 10/16/2019  . Right anterior knee pain 07/25/2019  . Educated about COVID-19 virus infection 11/15/2018  . Cramps, muscle, general  04/06/2018  . B12 deficiency anemia 07/24/2017  . Normocytic anemia 07/18/2017  . Back pain 07/18/2017  . Chemotherapy-induced peripheral neuropathy ([place]) 12/10/2016  . Hyperlipidemia 08/13/2016  . Hx of colonic polyps 08/13/2016  . Benign neoplasm of sigmoid colon 08/13/2016  . Endometrial cancer ([place]) 08/13/2016  . Pelvic  adhesive disease 08/13/2016  . 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 Lymphedema 07/20/2016  . Chronic venous insufficiency 07/20/2016  . PAD (peripheral artery disease) ([place]) 07/20/2016  . Class 1 obesity due to excess calories without serious comorbidity with body mass index (BMI) of 34.0 to 34.9 in adult 05/27/2016  . Leg cramps 05/27/2016  . Bronchiectasis ([place]) 12/19/2015  . Pre-syncope 12/19/2015  . Prolapsed, uterovaginal, incomplete 06/24/2014  . Routine general medical examination at a health care facility 12/30/2012  . Obesity (BMI 30-39.9) 12/30/2012  . Hx of multiple pulmonary nodules 12/28/2012  . Plantar fasciitis of right foot 12/28/2012  . Hip pain, bilateral 09/12/2012  . History of Hodgkin's lymphoma 09/12/2012  . Coronary artery disease   . Chest pain on exertion 02/29/2012    Past Surgical History:  Procedure Laterality Date  . ABDOMINAL HYSTERECTOMY    . bladder sling    . CARDIAC CATHETERIZATION  02/2012   [facility] 1 stent place  . CERVICAL POLYPECTOMY    . CHOLECYSTECTOMY  1982  . COLONOSCOPY WITH PROPOFOL N/A 02/05/2015   Procedure: COLONOSCOPY WITH PROPOFOL;  Surgeon: Lucilla Lame, MD;  Location: [facility] ENDOSCOPY;  Service: Endoscopy;  Laterality: N/A;  . CORONARY ANGIOPLASTY  02/2012   left/right s/p balloon  . CYSTOGRAM N/A 08/17/2016   Procedure: CYSTOGRAM;  Surgeon: Hollice Espy, MD;  Location: [facility] ORS;  Service: Urology;  Laterality: N/A;  . CYSTOSCOPY N/A 08/17/2016   Procedure: CYSTOSCOPY EXAM UNDER ANESTHESIA;  Surgeon: Hollice Espy, MD;  Location: [facility] ORS;  Service: Urology;  Laterality: N/A;  . CYSTOSCOPY W/ RETROGRADES Bilateral 08/17/2016   Procedure: CYSTOSCOPY WITH RETROGRADE PYELOGRAM;  Surgeon: Hollice Espy, MD;  Location: [facility] ORS;  Service: Urology;  Laterality: Bilateral;  . CYSTOSCOPY WITH STENT PLACEMENT Right 08/17/2016   Procedure: CYSTOSCOPY WITH STENT PLACEMENT;  Surgeon: Hollice Espy, MD;  Location: [facility] ORS;  Service: Urology;  Laterality:  Right;  . heart stent'  2013  . kidney stent Right 2018  . LYMPH NODE BIOPSY  2011   diagnosis of hodgkins lymphoma  . PELVIC LYMPH NODE DISSECTION N/A 07/29/2016   Procedure: PELVIC/AORTIC LYMPH NODE SAMPLING;  Surgeon: [name], MD;  Location: [facility] ORS;  Service: Gynecology;  Laterality: N/A;  . PORTA CATH INSERTION N/A 09/22/2016   Procedure: Glori Luis Cath Insertion;  Surgeon: [name], MD;  Location: [facility];  Service: Cardiovascular;  Laterality: N/A;  . PORTA CATH INSERTION N/A 10/27/2019   Procedure: PORTA CATH INSERTION;  Surgeon: [name], MD;  Location: [facility] [facility];  Service: Cardiovascular;  Laterality: N/A;  . PORTA CATH REMOVAL N/A 11/17/2016   Procedure: Glori Luis Cath Removal;  Surgeon: [name], MD;  Location: [facility];  Service: Cardiovascular;  Laterality: N/A;  . RIGHT/LEFT HEART CATH AND CORONARY ANGIOGRAPHY N/A 12/04/2019   Procedure: RIGHT/LEFT HEART CATH AND CORONARY ANGIOGRAPHY;  Surgeon: Wellington Hampshire, MD;  Location: [facility];  Service: Cardiovascular;  Laterality: N/A;  . ROBOTIC ASSISTED TOTAL HYSTERECTOMY WITH BILATERAL SALPINGO OOPHERECTOMY N/A 07/29/2016   Procedure: ROBOTIC ASSISTED TOTAL HYSTERECTOMY WITH BILATERAL SALPINGO OOPHORECTOMY;  Surgeon: [name], MD;  Location: [facility] ORS;  Service: Gynecology;  Laterality: N/A;  . SENTINEL NODE BIOPSY N/A 07/29/2016   Procedure: SENTINEL NODE BIOPSY;  Surgeon: [name], MD;  Location: [facility] ORS;  Service: Gynecology;  Laterality: N/A;  . transobturator sling N/A 2009   [place]    Prior to Admission medications   Medication Sig Start Date End Date Taking? Authorizing Provider  amitriptyline (ELAVIL) 25 MG tablet Take 1 tablet (25 mg total) by mouth at bedtime. May increase weekly as needed 04/02/20   [name], MD  apixaban (ELIQUIS) 2.5 MG TABS tablet Take 1 tablet (2.5 mg total) by mouth 2 (two) times  daily. 05/01/20   Sindy Guadeloupe, MD  atorvastatin (LIPITOR) 10 MG tablet Take 1 tablet by mouth once daily 10/23/19   Wellington Hampshire, MD  cholecalciferol (VITAMIN D3) 25 MCG (1000 UNIT) tablet Take 1,000 Units by mouth in the morning and at bedtime.    [provider]  cyanocobalamin (,VITAMIN B-12,) 1000 MCG/ML injection Inject 1,000 mcg into the muscle every 30 (thirty) days.    [provider]  empagliflozin (JARDIANCE) 10 MG TABS tablet Take 1 tablet (10 mg total) by mouth daily. 07/16/20   Loel Dubonnet, NP  furosemide (LASIX) 20 MG tablet Take 1 tablet (20 mg total) by mouth as needed for fluid or edema. 05/31/20   Loel Dubonnet, NP  HYDROcodone-homatropine (HYCODAN) 5-1.5 MG/5ML syrup Take 5 mLs by mouth every 6 (six) hours as needed for cough. 06/12/20   Sindy Guadeloupe, MD  ivabradine (CORLANOR) 5 MG TABS tablet Take 1 tablet (5 mg total) by mouth 2 (two) times daily with a meal. 07/16/20   Loel Dubonnet, NP  levothyroxine (SYNTHROID) 75 MCG tablet Take 1 tablet (75 mcg total) by mouth daily before breakfast. 07/15/20   Sindy Guadeloupe, MD  LORazepam (ATIVAN) 0.5 MG tablet Take 1 tablet (0.5 mg total) by mouth every 6 (six) hours as needed for anxiety (and to help with breathing). 02/01/20   Sindy Guadeloupe, MD  Morphine Sulfate (MORPHINE CONCENTRATE) 10 mg / 0.5 ml concentrated solution Take 0.25 mLs (5 mg total) by mouth every 6 (six) hours as needed for severe pain or shortness of breath. 02/27/20   Sindy Guadeloupe, MD  pantoprazole (PROTONIX) 20 MG tablet Take 1 tablet (20 mg total) by mouth daily. 05/17/20   Sindy Guadeloupe, MD  potassium chloride (KLOR-CON) 10 MEQ tablet Take 1 tablet (10 mEq total) by mouth daily as needed (Take on days that you take the Lasix.). 05/31/20   Loel Dubonnet, NP  prochlorperazine (COMPAZINE) 10 MG tablet Take 1 tablet (10 mg total) by mouth every 6 (six) hours as needed (Nausea or vomiting). Patient taking differently: Take 10 mg by  mouth daily as needed for nausea or vomiting.  10/25/19 05/15/20  Sindy Guadeloupe, MD    Allergies Metoprolol, Adhesive [tape], Antifungal [miconazole nitrate], Sulfa antibiotics, and Z-pak [azithromycin]  Family History  Problem Relation Age of Onset  . ALS Father   . Polymyositis Father   . Diabetes Brother   . Dementia Mother   . Cancer Maternal Aunt        breast  . Breast cancer Maternal Aunt        30's  . Stroke Maternal Grandmother   . Cancer Maternal Grandfather        prostate  . Stroke Maternal Grandfather   . Colon cancer Maternal Aunt   . Non-Hodgkin's lymphoma Cousin     Social  History Social History   Tobacco Use  . Smoking status: Former Smoker    Packs/day: 1.00    Years: 30.00    Pack years: 30.00    Types: Cigarettes    Quit date: 07/24/2002    Years since quitting: 18.0  . Smokeless tobacco: Never Used  . Tobacco comment: quit smoking in 2000  Vaping Use  . Vaping Use: Never used  Substance Use Topics  . Alcohol use: Not Currently  . Drug use: No    Review of Systems Constitutional: No fever. Eyes: No visual changes. ENT: No sore throat. Cardiovascular: + chest pain. Respiratory: + shortness of breath. Gastrointestinal: + nausea, vomiting, diarrhea 2 days ago that has resolved Genitourinary: Negative for dysuria. Musculoskeletal: Negative for back pain. Skin: Negative for rash. Neurological: Negative for focal weakness or numbness.  ____________________________________________   PHYSICAL EXAM:  VITAL SIGNS: ED Triage Vitals [07/19/20 2316]  Enc Vitals Group     BP (!) 121/107     Pulse Rate (!) 103     Resp 18     Temp 98.7 F (37.1 C)     Temp Source Oral     SpO2 100 %     Weight 139 lb 15.9 oz (63.5 kg)     Height 5\' 1"  (1.549 m)     Head Circumference      Peak Flow      Pain Score 0     Pain Loc      Pain Edu?      Excl. in [place]?    CONSTITUTIONAL: Alert and oriented and responds appropriately to questions.  Well-appearing; well-nourished, in no distress HEAD: Normocephalic EYES: Conjunctivae clear, pupils appear equal, EOM appear intact ENT: normal nose; moist mucous membranes NECK: Supple, normal ROM CARD: RRR; S1 and S2 appreciated; no murmurs, no clicks, no rubs, no gallops RESP: Normal chest excursion without splinting or tachypnea; breath sounds clear and equal bilaterally; no wheezes, no rhonchi, no rales, no hypoxia 2 L nasal cannula which is her baseline or respiratory distress, speaking full sentences ABD/GI: Normal bowel sounds; non-distended; soft, non-tender, no rebound, no guarding, no peritoneal signs, no hepatosplenomegaly BACK: The back appears normal EXT: Normal ROM in all joints; no deformity noted, no edema; no cyanosis, no calf tenderness or calf swelling SKIN: Normal color for age and race; warm; no rash on exposed skin NEURO: Moves all extremities equally PSYCH: The patient's mood and manner are appropriate.  ____________________________________________   LABS (all labs ordered are listed, but only abnormal results are displayed)  Labs Reviewed  BASIC METABOLIC PANEL - Abnormal; Notable for the following components:      Result Value   Sodium 134 (*)    Glucose, Bld 145 (*)    Creatinine, Ser 1.69 (*)    Calcium 8.7 (*)    GFR, Estimated 32 (*)    All other components within normal limits  CBC - Abnormal; Notable for the following components:   WBC 22.6 (*)    RBC 3.68 (*)    Hemoglobin 9.4 (*)    HCT 31.4 (*)    MCH 25.5 (*)    MCHC 29.9 (*)    RDW 18.5 (*)    All other components within normal limits  PROTIME-INR - Abnormal; Notable for the following components:   Prothrombin Time 16.6 (*)    INR 1.4 (*)    All other components within normal limits  APTT - Abnormal; Notable for the following components:   aPTT  40 (*)    All other components within normal limits  CBC - Abnormal; Notable for the following components:   WBC 18.0 (*)    RBC 3.06 (*)     Hemoglobin 8.0 (*)    HCT 25.6 (*)    RDW 18.4 (*)    All other components within normal limits  TROPONIN I (HIGH SENSITIVITY) - Abnormal; Notable for the following components:   Troponin I (High Sensitivity) 95 (*)    All other components within normal limits  TROPONIN I (HIGH SENSITIVITY) - Abnormal; Notable for the following components:   Troponin I (High Sensitivity) 587 (*)    All other components within normal limits  CULTURE, BLOOD (ROUTINE X 2)  CULTURE, BLOOD (ROUTINE X 2)  URINE CULTURE  SARS CORONAVIRUS 2 (TAT 6-24 HRS)  MAGNESIUM  TSH  PHOSPHORUS  LACTIC ACID, PLASMA  URINALYSIS, ROUTINE W REFLEX MICROSCOPIC  HEPARIN LEVEL (UNFRACTIONATED)  HEPARIN LEVEL (UNFRACTIONATED)  APTT  OCCULT BLOOD X 1 CARD TO LAB, STOOL   ____________________________________________  EKG   EKG Interpretation  Date/Time:  Friday July 19 2020 23:26:48 EST Ventricular Rate:  95 PR Interval:    QRS Duration: 125 QT Interval:  435 QTC Calculation: 547 R Axis:   20 Text Interpretation: Sinus rhythm Atrial premature complex Left bundle branch block No significant change since last tracing Confirmed by Pryor Curia [phone]) on 07/20/2020 1:44:36 AM       ____________________________________________  RADIOLOGY Jessie Foot Octavian Godek, personally viewed and evaluated these images (plain radiographs) as part of my medical decision making, as well as reviewing the written report by the radiologist.  ED MD interpretation: Chest x-ray shows no acute abnormality.  Official radiology report(s): DG Chest 2 View  Result Date: 07/19/2020 CLINICAL DATA:  SVT, chest pressure, diaphoresis, history of endometrial cancer EXAM: CHEST - 2 VIEW COMPARISON:  05/19/2020, 05/29/2020 FINDINGS: Frontal and lateral views of the chest demonstrate stable right chest wall port. Cardiac silhouette is unremarkable. Post radiation changes are seen along the hilar margins, stable. No airspace disease, effusion, or  pneumothorax. No acute bony abnormalities. IMPRESSION: 1. No acute intrathoracic process. Electronically Signed   By: Randa Ngo M.D.   On: 07/19/2020 23:41    ____________________________________________   PROCEDURES  Procedure(s) performed (including Critical Care):  Procedures  CRITICAL CARE Performed by: Cyril Mourning Rafiq Bucklin   Total critical care time: 65 minutes  Critical care time was exclusive of separately billable procedures and treating other patients.  Critical care was necessary to treat or prevent imminent or life-threatening deterioration.  Critical care was time spent personally by me on the following activities: development of treatment plan with patient and/or surrogate as well as nursing, discussions with consultants, evaluation of patient's response to treatment, examination of patient, obtaining history from patient or surrogate, ordering and performing treatments and interventions, ordering and review of laboratory studies, ordering and review of radiographic studies, pulse oximetry and re-evaluation of patient's condition.  ____________________________________________   INITIAL IMPRESSION / ASSESSMENT AND PLAN / ED COURSE  As part of my medical decision making, I reviewed the following data within the [place] notes reviewed and incorporated, Labs reviewed, EKG interpreted NSR, Old EKG reviewed, Old chart reviewed, Radiograph reviewed, Discussed with admitting physician, A consult was requested and obtained from this/these consultant(s) Cardiology and Notes from prior ED visits         Patient here after episode of SVT.  Noted to be slightly hypotensive.  Will give IV fluid  bolus.  Will check electrolytes, anemia, troponin, TSH.  Currently in a sinus rhythm.  Currently asymptomatic.   Patient's initial troponin has come back slightly elevated at 95.  This may be related to demand ischemia from SVT.  She is also noted to have a  leukocytosis of 22,000.  She denies any recent Neulasta injections.  She denies to me any recent infectious symptoms.  Will obtain chest x-ray, urine, cultures, lactic.  Given slightly low blood pressures with leukocytosis I am concerned for possible sepsis, will give broad-spectrum antibiotics.  Patient reports her blood pressures frequently run low and on evaluation of her flowsheet it does appear that she has had blood pressures in the [id number] to 90 systolic previously.  She is very well-appearing here and mentating normally.  Patient's lactate has come back normal.  Second troponin is now 587.  Will give aspirin and start heparin.  EKG shows a left bundle branch block which is old for patient.  She is not having any chest pain or shortness of breath at this time.   Discussed case with Dr. Damita Dunnings with hospitalist service.  She is concerned that this could also be cardiogenic shock recommends consultation with cardiology to determine if they would start patient on dobutamine, milrinone.    4:30 AM  Spoke with Dr. Johnsie Cancel with cardiology.  He recommends starting 100 mL of IV fluid per hour but does not recommend pressors at this time given her poor prognosis due to her cancer.  Patient is a DNR.  5:52 AM Discussed patient's case with hospitalist, Dr. Damita Dunnings.  I have recommended admission and patient (and family if present) agree with this plan. Admitting physician will place admission orders.  She feels given patient is mentating normally and well-appearing without symptoms that patient can be admitted to stepdown and I agree.  Patient has told the hospitalist that she did have recent vomiting and diarrhea 2 days ago that has resolved.  Hypotension may also be secondary to hypovolemia.   I reviewed all nursing notes, vitals, pertinent previous records and reviewed/interpreted all EKGs, lab and urine results, imaging (as available).    ____________________________________________   FINAL CLINICAL  IMPRESSION(S) / ED DIAGNOSES  Final diagnoses:  SVT (supraventricular tachycardia) (HCC)  NSTEMI (non-ST elevated myocardial infarction) (HCC)  Leukocytosis, unspecified type     ED Discharge Orders    None      *Please note:  DWAYNE LIERMAN was evaluated in Emergency Department on 07/20/2020 for the symptoms described in the history of present illness. She was evaluated in the context of the global COVID-19 pandemic, which necessitated consideration that the patient might be at risk for infection with the SARS-CoV-2 virus that causes COVID-19. Institutional protocols and algorithms that pertain to the evaluation of patients at risk for COVID-19 are in a state of rapid change based on information released by regulatory bodies including the CDC and federal and state organizations. These policies and algorithms were followed during the patient's care in the ED.  Some ED evaluations and interventions may be delayed as a result of limited staffing during and the pandemic.*   Note:  This document was prepared using Dragon voice recognition software and may include unintentional dictation errors.   Kipp Shank, Delice Bison, DO 07/20/20 [phone]

## 2020-07-21 DIAGNOSIS — K529 Noninfective gastroenteritis and colitis, unspecified: Secondary | ICD-10-CM

## 2020-07-21 DIAGNOSIS — I248 Other forms of acute ischemic heart disease: Secondary | ICD-10-CM

## 2020-07-21 DIAGNOSIS — I959 Hypotension, unspecified: Secondary | ICD-10-CM

## 2020-07-21 DIAGNOSIS — I214 Non-ST elevation (NSTEMI) myocardial infarction: Secondary | ICD-10-CM | POA: Diagnosis not present

## 2020-07-21 DIAGNOSIS — I471 Supraventricular tachycardia: Principal | ICD-10-CM

## 2020-07-21 DIAGNOSIS — I5022 Chronic systolic (congestive) heart failure: Secondary | ICD-10-CM

## 2020-07-21 LAB — IRON AND TIBC
Iron: 18 ug/dL — ABNORMAL LOW (ref 28–170)
Saturation Ratios: 7 % — ABNORMAL LOW (ref 10.4–31.8)
TIBC: 246 ug/dL — ABNORMAL LOW (ref 250–450)
UIBC: 228 ug/dL

## 2020-07-21 LAB — CBC
HCT: 26.3 % — ABNORMAL LOW (ref 36.0–46.0)
Hemoglobin: 7.9 g/dL — ABNORMAL LOW (ref 12.0–15.0)
MCH: 26.3 pg (ref 26.0–34.0)
MCHC: 30 g/dL (ref 30.0–36.0)
MCV: 87.7 fL (ref 80.0–100.0)
Platelets: 204 10*3/uL (ref 150–400)
RBC: 3 MIL/uL — ABNORMAL LOW (ref 3.87–5.11)
RDW: 18.6 % — ABNORMAL HIGH (ref 11.5–15.5)
WBC: 14.9 10*3/uL — ABNORMAL HIGH (ref 4.0–10.5)
nRBC: 0 % (ref 0.0–0.2)

## 2020-07-21 LAB — APTT
aPTT: 34 seconds (ref 24–36)
aPTT: 56 seconds — ABNORMAL HIGH (ref 24–36)
aPTT: 57 seconds — ABNORMAL HIGH (ref 24–36)

## 2020-07-21 LAB — BLOOD CULTURE ID PANEL (REFLEXED) - BCID2

## 2020-07-21 LAB — BASIC METABOLIC PANEL
Anion gap: 9 (ref 5–15)
BUN: 13 mg/dL (ref 8–23)
CO2: 23 mmol/L (ref 22–32)
Calcium: 8.4 mg/dL — ABNORMAL LOW (ref 8.9–10.3)
Chloride: 106 mmol/L (ref 98–111)
Creatinine, Ser: 1.33 mg/dL — ABNORMAL HIGH (ref 0.44–1.00)
GFR, Estimated: 43 mL/min — ABNORMAL LOW (ref >60)
Glucose, Bld: 80 mg/dL (ref 70–99)
Potassium: 4.1 mmol/L (ref 3.5–5.1)
Sodium: 138 mmol/L (ref 135–145)

## 2020-07-21 LAB — MAGNESIUM: Magnesium: 1.8 mg/dL (ref 1.7–2.4)

## 2020-07-21 LAB — TROPONIN I (HIGH SENSITIVITY): Troponin I (High Sensitivity): 278 ng/L (ref <18)

## 2020-07-21 LAB — VITAMIN B12: Vitamin B-12: 1163 pg/mL — ABNORMAL HIGH (ref 180–914)

## 2020-07-21 LAB — PHOSPHORUS: Phosphorus: 3.6 mg/dL (ref 2.5–4.6)

## 2020-07-21 LAB — HEPARIN LEVEL (UNFRACTIONATED): Heparin Unfractionated: 1.01 IU/mL — ABNORMAL HIGH (ref 0.30–0.70)

## 2020-07-21 MED ORDER — FUROSEMIDE 20 MG PO TABS
20.0000 mg | ORAL_TABLET | Freq: Every day | ORAL | Status: DC
  Administered 2020-07-21 – 2020-07-22 (×2): 20 mg via ORAL
  Filled 2020-07-21 (×2): qty 1

## 2020-07-21 MED ORDER — HEPARIN BOLUS VIA INFUSION
900.0000 [IU] | Freq: Once | INTRAVENOUS | Status: DC
  Filled 2020-07-21: qty 900

## 2020-07-21 MED ORDER — IVABRADINE HCL 5 MG PO TABS
5.0000 mg | ORAL_TABLET | Freq: Two times a day (BID) | ORAL | Status: DC
  Administered 2020-07-21 – 2020-07-22 (×3): 5 mg via ORAL
  Filled 2020-07-21 (×4): qty 1

## 2020-07-21 MED ORDER — AMIODARONE HCL 200 MG PO TABS
200.0000 mg | ORAL_TABLET | Freq: Two times a day (BID) | ORAL | Status: DC
  Administered 2020-07-21 – 2020-07-22 (×3): 200 mg via ORAL
  Filled 2020-07-21 (×3): qty 1

## 2020-07-21 MED ORDER — HEPARIN BOLUS VIA INFUSION
850.0000 [IU] | Freq: Once | INTRAVENOUS | Status: AC
  Administered 2020-07-21: 850 [IU] via INTRAVENOUS
  Filled 2020-07-21: qty 850

## 2020-07-21 MED ORDER — APIXABAN 2.5 MG PO TABS
2.5000 mg | ORAL_TABLET | Freq: Two times a day (BID) | ORAL | Status: DC
  Administered 2020-07-21 – 2020-07-22 (×3): 2.5 mg via ORAL
  Filled 2020-07-21 (×3): qty 1

## 2020-07-21 NOTE — Progress Notes (Signed)
[place] for Heparin  Indication: chest pain/ACS  Allergies  Allergen Reactions  . Metoprolol Other (See Comments)    Fatigue  . Adhesive [Tape] Other (See Comments)    Skin Irritation   . Antifungal [Miconazole Nitrate] Rash  . Sulfa Antibiotics Nausea And Vomiting and Rash  . Z-Pak [Azithromycin] Itching    Patient Measurements: Height: 5\' 1"  (154.9 cm) Weight: 63.5 kg (139 lb 15.9 oz) IBW/kg (Calculated) : 47.8 Heparin Dosing Weight:  60.9 kg   Vital Signs: Temp: 98 F (36.7 C) (02/06 0856) Temp Source: Oral (02/06 0856) BP: 94/54 (02/06 0810) Pulse Rate: 81 (02/06 0810)  Labs: Recent Labs    07/19/20 2311 07/19/20 2311 07/20/20 0227 07/20/20 0400 07/20/20 1237 07/21/20 0005 07/21/20 0455 07/21/20 0913  HGB 9.4*  --   --  8.0*  --   --  7.9*  --   HCT 31.4*  --   --  25.6*  --   --  26.3*  --   PLT 291  --   --  210  --   --  204  --   APTT  --    < >  --  40* 143* 56*  --  57*  LABPROT 16.6*  --   --   --   --   --   --   --   INR 1.4*  --   --   --   --   --   --   --   HEPARINUNFRC  --   --   --  3.14* 2.56*  --   --  1.01*  CREATININE 1.69*  --   --   --   --   --  1.33*  --   TROPONINIHS 95*  --  587*  --   --   --   --  278*   < > = values in this interval not displayed.    Estimated Creatinine Clearance: 33.6 mL/min (A) (by C-G formula based on SCr of 1.33 mg/dL (H)).   Medical History: Past Medical History:  Diagnosis Date  . Anxiety   . Cervicalgia   . Chronic kidney disease   . Coronary artery disease    a. 02/2012 Stress echo: severe anterior wall ischemia;  b. 02/2012 Cath/PCI: LAD 95p (3.0 x 15 Xience EX DES), D1 90ost (PTCA - bifurcational dzs), EF 45% with anterior HK;  c. 02/2013 Ex MV: fixed anterior defect w/ minor reversibility, nl EF-->Med Rx; d. 11/2019 MV: EF 41%, ant/antlat ischemia-->high risk scan; e. 11/2019 Cath: LM nl, LAD 40ost/p, patent LAD stent, LCX nl, RCA nl, RPDA/RPAV nl, EF 30  .  Endometrial cancer ([place])    a. 07/2016 s/p robotic hysterectomy, BSO w/ washings, sentinel node inj, mapping, bx, adhesiolysis.  . Essential hypertension, benign   . Fibrocystic breast disease   . GERD (gastroesophageal reflux disease)   . Gestational hypertension   . Heart murmur   . HFrEF (heart failure with reduced ejection fraction) ([place])    a. 05/2020 Echo: EF <20%, glob HK.  Marland Kitchen History of anemia   . History of blood transfusion   . Hodgkin's lymphoma ([place]) 2011   a. s/p radiation and chemo therapy  . Mixed Ischemic and Nonischemic Cardiomyopathy    a. 05/2020 Echo: EF <20%, glob HK, Nl RV size/fxn, mildly dil LA. Mild MR. Mild to mod Ao sclerosis.  . Osteoarthritis   . Polycystic ovarian disease  Medications:  Medications Prior to Admission  Medication Sig Dispense Refill Last Dose  . amitriptyline (ELAVIL) 25 MG tablet Take 1 tablet (25 mg total) by mouth at bedtime. May increase weekly as needed 90 tablet 3   . apixaban (ELIQUIS) 2.5 MG TABS tablet Take 1 tablet (2.5 mg total) by mouth 2 (two) times daily. 60 tablet 1   . atorvastatin (LIPITOR) 10 MG tablet Take 1 tablet by mouth once daily (Patient taking differently: Take 10 mg by mouth daily.) 90 tablet 2   . cholecalciferol (VITAMIN D3) 25 MCG (1000 UNIT) tablet Take 1,000 Units by mouth in the morning and at bedtime.     . cyanocobalamin (,VITAMIN B-12,) 1000 MCG/ML injection Inject 1,000 mcg into the muscle every 30 (thirty) days.     . empagliflozin (JARDIANCE) 10 MG TABS tablet Take 1 tablet (10 mg total) by mouth daily. 30 tablet 5   . furosemide (LASIX) 20 MG tablet Take 1 tablet (20 mg total) by mouth as needed for fluid or edema. 30 tablet 2   . HYDROcodone-homatropine (HYCODAN) 5-1.5 MG/5ML syrup Take 5 mLs by mouth every 6 (six) hours as needed for cough. 120 mL 0 Unknown at PRN  . ivabradine (CORLANOR) 5 MG TABS tablet Take 1 tablet (5 mg total) by mouth 2 (two) times daily with a meal. 60 tablet 5   .  levothyroxine (SYNTHROID) 75 MCG tablet Take 1 tablet (75 mcg total) by mouth daily before breakfast. 30 tablet 2   . LORazepam (ATIVAN) 0.5 MG tablet Take 1 tablet (0.5 mg total) by mouth every 6 (six) hours as needed for anxiety (and to help with breathing). 120 tablet 0 Unknown at PRN  . Morphine Sulfate (MORPHINE CONCENTRATE) 10 mg / 0.5 ml concentrated solution Take 0.25 mLs (5 mg total) by mouth every 6 (six) hours as needed for severe pain or shortness of breath. 30 mL 0 Unknown at PRN  . pantoprazole (PROTONIX) 20 MG tablet Take 1 tablet (20 mg total) by mouth daily. 60 tablet 1   . potassium chloride (KLOR-CON) 10 MEQ tablet Take 1 tablet (10 mEq total) by mouth daily as needed (Take on days that you take the Lasix.). 30 tablet 2     Assessment: Pharmacy consulted to dose heparin in this 70 year old female admitted with ACS/NSTEMI.  CrCl = 26.5 ml/min Pt was on Eliquis 2.5 mg PO BID , last dose on 2/4 @ 1900.  2/5 0400 HL 3.14 aPTT 40 (baseline) 2/5 1237 HL 2.56 aPTT 143 - will hold heparin for 1 hours and restart at a lower dose.  2/6 0005 aPTT 56, subtherapeutic  2/6 0913 aPTT 57, HL 1.01  increase heparin infusion to 800 units/hr and bolus 900 unit x 1.  Goal of Therapy:  APTT = 66 - 102 sec Heparin level 0.3-0.7 units/ml, once aPTT and HL correlate.  Monitor platelets by anticoagulation protocol: Yes   Plan:  Heparin level is falsely elevated due to apixaban PTA and aPTT is subtherapeutic. Will increase heparin infusion to 800 units/hr and bolus 900 unit x 1. Recheck aPTT in 8 hours. CBC and Heparin level daily while on heparin.  Will use aPTT to guide dosing until HL and aPTT until levels correlate.   Oswald Hillock 07/21/2020,10:09 AM

## 2020-07-21 NOTE — Progress Notes (Signed)
PHARMACY - PHYSICIAN COMMUNICATION CRITICAL VALUE ALERT - BLOOD CULTURE IDENTIFICATION (BCID)  Jaime Hall is an 70 y.o. female who presented to [facility] on 07/19/2020 with a chief complaint of Gastroentiritis, SVT.   Assessment:  Staph species growing in 1 of 4 bottles ,  No resistance detected.  Most likely this is a contaminant.  (include suspected source if known)  Name of physician (or Provider) Contacted: Prudy Feeler   Current antibiotics: none   Changes to prescribed antibiotics recommended:  No abx warranted at this time.   Results for orders placed or performed during the hospital encounter of 07/19/20  Blood Culture ID Panel (Reflexed) (Collected: 07/20/2020  2:28 AM)  Result Value Ref Range   Enterococcus faecalis NOT DETECTED NOT DETECTED   Enterococcus Faecium NOT DETECTED NOT DETECTED   Listeria monocytogenes NOT DETECTED NOT DETECTED   Staphylococcus species DETECTED (A) NOT DETECTED   Staphylococcus aureus (BCID) NOT DETECTED NOT DETECTED   Staphylococcus epidermidis NOT DETECTED NOT DETECTED   Staphylococcus lugdunensis NOT DETECTED NOT DETECTED   Streptococcus species NOT DETECTED NOT DETECTED   Streptococcus agalactiae NOT DETECTED NOT DETECTED   Streptococcus pneumoniae NOT DETECTED NOT DETECTED   Streptococcus pyogenes NOT DETECTED NOT DETECTED   A.calcoaceticus-baumannii NOT DETECTED NOT DETECTED   Bacteroides fragilis NOT DETECTED NOT DETECTED   Enterobacterales NOT DETECTED NOT DETECTED   Enterobacter cloacae complex NOT DETECTED NOT DETECTED   Escherichia coli NOT DETECTED NOT DETECTED   Klebsiella aerogenes NOT DETECTED NOT DETECTED   Klebsiella oxytoca NOT DETECTED NOT DETECTED   Klebsiella pneumoniae NOT DETECTED NOT DETECTED   Proteus species NOT DETECTED NOT DETECTED   Salmonella species NOT DETECTED NOT DETECTED   Serratia marcescens NOT DETECTED NOT DETECTED   Haemophilus influenzae NOT DETECTED NOT DETECTED   Neisseria meningitidis NOT  DETECTED NOT DETECTED   Pseudomonas aeruginosa NOT DETECTED NOT DETECTED   Stenotrophomonas maltophilia NOT DETECTED NOT DETECTED   Candida albicans NOT DETECTED NOT DETECTED   Candida auris NOT DETECTED NOT DETECTED   Candida glabrata NOT DETECTED NOT DETECTED   Candida krusei NOT DETECTED NOT DETECTED   Candida parapsilosis NOT DETECTED NOT DETECTED   Candida tropicalis NOT DETECTED NOT DETECTED   Cryptococcus neoformans/gattii NOT DETECTED NOT DETECTED    Tarun Patchell D 07/21/2020  4:15 AM

## 2020-07-21 NOTE — Progress Notes (Signed)
Progress Note  Patient Name: Jaime Hall Date of Encounter: 07/21/2020  Primary Cardiologist: [name], MD  Subjective   No c/p, sob, or recurrent palpitations/presyncope/syncope.  No events on tele.  Has noted a mild nagging cough since admission.    Inpatient Medications    Scheduled Meds: . aspirin EC  81 mg Oral Daily  . atorvastatin  10 mg Oral Daily  . heparin  900 Units Intravenous Once   Continuous Infusions: . amiodarone 30 mg/hr (07/21/20 0045)  . heparin 700 Units/hr (07/21/20 0048)  . sodium chloride     PRN Meds: acetaminophen, nitroGLYCERIN, ondansetron (ZOFRAN) IV   Vital Signs    Vitals:   07/21/20 0250 07/21/20 0325 07/21/20 0810 07/21/20 0856  BP:  (!) 106/55 (!) 94/54   Pulse: 74 83 81   Resp: (!) 23 (!) 22 (!) 22 20  Temp:  (!) 97.5 F (36.4 C) 97.9 F (36.6 C) 98 F (36.7 C)  TempSrc:  Oral Oral Oral  SpO2: 98% 98% 100%   Weight:      Height:        Intake/Output Summary (Last 24 hours) at 07/21/2020 1016 Last data filed at 07/21/2020 0700 Gross per 24 hour  Intake 935.63 ml  Output -  Net 935.63 ml   Filed Weights   07/19/20 2316  Weight: 63.5 kg    Physical Exam   GEN: somewhat frail, in no acute distress.  HEENT: Grossly normal.  Neck: Supple, mod elev JVP, no carotid bruits, or masses. Cardiac: RRR, no murmurs, rubs, or gallops. No clubbing, cyanosis, trace bilat ankle edema.  Radials 2+, DP/PT 2+ and equal bilaterally.  Respiratory:  Respirations regular and unlabored, diminished breath sounds @ bases w/ crackles. GI: Soft, nontender, nondistended, BS + x 4.   MS: no deformity or atrophy. Skin: warm and dry, no rash. Neuro:  Strength and sensation are intact. Psych: AAOx3.  Normal affect.  Labs    Chemistry Recent Labs  Lab 07/19/20 2311 07/21/20 0455  NA 134* 138  K 3.8 4.1  CL 100 106  CO2 22 23  GLUCOSE 145* 80  BUN 18 13  CREATININE 1.69* 1.33*  CALCIUM 8.7* 8.4*  GFRNONAA 32* 43*  ANIONGAP 12  9     Hematology Recent Labs  Lab 07/19/20 2311 07/20/20 0400 07/21/20 0455  WBC 22.6* 18.0* 14.9*  RBC 3.68* 3.06* 3.00*  HGB 9.4* 8.0* 7.9*  HCT 31.4* 25.6* 26.3*  MCV 85.3 83.7 87.7  MCH 25.5* 26.1 26.3  MCHC 29.9* 31.3 30.0  RDW 18.5* 18.4* 18.6*  PLT 291 210 204    Cardiac Enzymes  Recent Labs  Lab 07/19/20 2311 07/20/20 0227 07/21/20 0913  TROPONINIHS 95* 587* 278*      Lipids  Lab Results  Component Value Date   CHOL 114 12/30/2019   HDL 47 12/30/2019   LDLCALC 41 12/30/2019   TRIG 131 12/30/2019   CHOLHDL 2.4 12/30/2019    HbA1c  Lab Results  Component Value Date   HGBA1C 6.1 (H) 12/30/2019    Radiology    CT ABDOMEN PELVIS WO CONTRAST  Result Date: 07/20/2020 CLINICAL DATA:  Nausea and vomiting. EXAM: CT ABDOMEN AND PELVIS WITHOUT CONTRAST TECHNIQUE: Multidetector CT imaging of the abdomen and pelvis was performed following the standard protocol without IV contrast. COMPARISON:  05/29/2020 FINDINGS: Lower chest: Low-density blood pool suggesting anemia. No acute finding. Hepatobiliary: Parenchymal mass at the right upper lobe which has enlarged to 4.4 cm compared to  3.5 cm previously. There are additional liver surface masses along the right liver attributed to patient's peritoneal disease, progressed. New metastatic disease at the central fissures without visible ductal obstruction. Cholecystectomy. Pancreas: Generalized atrophy and fatty infiltration. Spleen: Unremarkable. Adrenals/Urinary Tract: Negative adrenals. No hydronephrosis or stone. Right lower renal cystic densities. Unremarkable collapsed bladder. Stomach/Bowel:  No obstruction. No visible bowel inflammation Vascular/Lymphatic: No acute vascular abnormality. No noted retroperitoneal adenopathy. Aortic atherosclerosis. Reproductive:Hysterectomy. Other: Peritoneal metastatic disease with multiple nodules and masses especially along the upper abdominal cavity. These have increased in number and  size, with increase especially seen around the liver and left upper quadrant. Musculoskeletal: Chronic pathologic sacral fractures with unchanged alignment. Lumbar spine degeneration with L4-5 anterolisthesis. IMPRESSION: Progressive hepatic and peritoneal metastatic disease but no bowel obstruction or visible inflammation to specifically correlate with history of nausea and vomiting. Electronically Signed   By: Monte Fantasia M.D.   On: 07/20/2020 05:52   DG Chest 2 View  Result Date: 07/19/2020 CLINICAL DATA:  SVT, chest pressure, diaphoresis, history of endometrial cancer EXAM: CHEST - 2 VIEW COMPARISON:  05/19/2020, 05/29/2020 FINDINGS: Frontal and lateral views of the chest demonstrate stable right chest wall port. Cardiac silhouette is unremarkable. Post radiation changes are seen along the hilar margins, stable. No airspace disease, effusion, or pneumothorax. No acute bony abnormalities. IMPRESSION: 1. No acute intrathoracic process. Electronically Signed   By: Randa Ngo M.D.   On: 07/19/2020 23:41   CT HEAD WO CONTRAST  Result Date: 07/20/2020 CLINICAL DATA:  Syncope, blood thinners with head trauma in a 70 year old female. EXAM: CT HEAD WITHOUT CONTRAST TECHNIQUE: Contiguous axial images were obtained from the base of the skull through the vertex without intravenous contrast. COMPARISON:  May 29, 2020 FINDINGS: Brain: No evidence of acute infarction, hemorrhage, hydrocephalus, extra-axial collection or mass lesion/mass effect. Atrophy and chronic microvascular ischemic changes as before. Vascular: No hyperdense vessel or unexpected calcification. Skull: Normal. Negative for fracture or focal lesion. Sinuses/Orbits: Visualized paranasal sinuses and orbits are unremarkable. Other: None. IMPRESSION: 1. No acute intracranial abnormality. 2. Atrophy and chronic microvascular ischemic changes as before. Electronically Signed   By: Zetta Bills M.D.   On: 07/20/2020 13:06    Telemetry     RSR 70's to 80's - Personally Reviewed  Cardiac Studies   Cardiac Catheterization 6.20.2021  Left Main  Vessel is angiographically normal.  Left Anterior Descending  Ost LAD to Prox LAD lesion is 40% stenosed.  Previously placed Prox LAD stent (unknown type) is widely patent.  Left Circumflex  Vessel is angiographically normal.  Right Coronary Artery  Vessel is angiographically normal.  Right Posterior Descending Artery  Vessel is angiographically normal.  Right Posterior Atrioventricular Artery  Vessel is angiographically normal.   EF is 30%  _____________   2D Echocardiogram 12.6.2021  1. Left ventricular ejection fraction, by estimation, is <20%. The left  ventricle has severely decreased function. The left ventricle demonstrates  global hypokinesis. The left ventricular internal cavity size was mildly  dilated. Left ventricular  diastolic parameters were normal.   2. Right ventricular systolic function is normal. The right ventricular  size is normal.   3. Left atrial size was mildly dilated.   4. The mitral valve was not well visualized. Mild mitral valve  regurgitation.   5. The aortic valve was not well visualized. Aortic valve regurgitation  is mild to moderate. Mild to moderate aortic valve sclerosis/calcification  is present, without any evidence of aortic stenosis.  _____________  Patient Profile    70 y.o. female with a history of CAD status post PCI undergoing stent placement to the proximal LAD, mixed ischemic and nonischemic cardiomyopathy with a EF of less than 20% by echo in December 2021 (felt to be secondary to chemotherapy/Adriamicin), stage III chronic kidney disease, Hodgkin's lymphoma, pulmonary embolism on Eliquis (02/2020), metastatic endometrial cancer, chronic hypotension, normocytic anemia, and GERD, who was admitted 2/4 after developing SVT and presyncope @ home.  SVT broke w/ adenosine.  HsTrop elev to 587 in setting of demand  ischemia.  Assessment & Plan    1.  PSVT:  On evening of 2/4, she had sudden onset of chest tightness assoc w/ tachypalps and presyncope.  Found to be in SVT w/ underlying LBBB by EMS and was successfully treated w/ adenosine 6mg  IV x 1.  She is not interested in catheter ablation and thus we started IV amio while in ED in setting of chronic hypotension and intolerance to  blocker in the past 2/2 presyncope/orthostasis. She has tolerated amio well w/o recurrent SVT.  Pressures stable.  Will transition to amio 200 bid today.  2.  Demand Ischemia/CAD:  H/o LAD stenting in 2013 w/ cath in 12/2019 showing patent LAD stent and nonobs ost/prox LAD dzs. Otw nl cors.  She had chest pressure mid-last week in the setting of n/v and again on the evening of admission in the setting of SVT.  HsTrop rose from 95  587  278.  She has had no recurrent arrhythmia and no recurrent c/p.  Suspect demand ischemia in the setting of tachycardia.  Given relatively recent cath, no plan to pursue ischemic evaluation at this time.  Cont asa/statin.  No  blocker 2/2 chronic hypotension. D/c heparin. Ambulate.  3.  HFrEF/Mixed ICM/NICM:  EF <20% by echo 05/2020.  Was doing well @ home on prn lasix.  In the setting of hypotension in ED yesterday, she received 1500 ml.  She has since noted a nagging cough and is currently on O2.  Mod elev JVP w/ diminished breath sounds @ bases and few basilar crackles on exam.  Will give lasix 20 PO this AM.  Wean O2.  Ambulate.  Resume corlanor and empagliflozin (non-formulaly - ok to resume @ discharge). No  blocker, ace/arb/arni/mra in the setting of chronic hypotension.  4.  Chronic Hypotension:  Stable and asymptomatic.  5.  CKD III:  Creat improved w/ hydration yesterday.  6.  Normocytic anemia:  Acutely worse this admission - ? Dilutional in setting of IVF 2/5.  No known bleeding.  Follow.  7.  Leukocytosis:  WBC coming down  14.9. Afebrile.  8.  Presyncope/syncope:  H/o presyncope on  metoprolol therapy previously.  Had an episode ~ 1 wk prior to admission where she briefly lost consciousness while preparing lunch.  She became presyncopal and was able to sit on the kitchen floor but did lose consciousness and fell back from a seated position, striking her head on the floor.  CT head neg 2/5.  No arrhythmias on monitor overnight, though known PSVT on admission (assoc w/ PSVT) and also risk of ventricular arrhythmias in the setting of NICM.  Will arrange for 2 wk Zio as outpt through out office.  9.  HL:  LDL 41.  Cont statin rx.  Signed, Murray Hodgkins, NP  07/21/2020, 10:16 AM    For questions or updates, please contact   Please consult [email or web address] for contact info under Cardiology/STEMI.

## 2020-07-21 NOTE — Progress Notes (Signed)
PROGRESS NOTE    Jaime Hall  TKW:[id number] DOB: 1951/03/01 DOA: 07/19/2020 PCP: [name], MD   Chief complaint.  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  She was found to be in SVT when EMS arrived.  She was given adenosine which aborted SVT. Patient remained hypotensive after she arrived in the ED, She was given fluid boluses.  Cardiology was consulted.  Patient cannot get CCA, ACEI or beta-blockers given reduced EF and chronic hypotension. Patient is placed on IV heparin, IV amiodarone drip.  Assessment & Plan:   Principal Problem:   SVT (supraventricular tachycardia) (HCC) Active Problems:   Coronary artery disease   PAD (peripheral artery disease) (HCC)   Endometrial cancer (HCC)   Stage 3b chronic kidney disease (HCC)   Hypothyroidism   Chronic systolic CHF (congestive heart failure) (HCC)   NICM (nonischemic cardiomyopathy) (HCC)   Hypotension   NSTEMI (non-ST elevated myocardial infarction) ([place])   Acute gastroenteritis  #1.  PSVT. Condition had improved.  2.  Non-STEMI secondary to tachycardia. History of CAD. Patient has been seen by cardiology, troponin has been trending down. Patient has pending echocardiogram.  Restarted oral diuretics per cardiology.  #3.  Hypotension. Patient had recent diarrhea and nausea vomiting, this is secondary to dehydration. Blood pressure has been better.  4.  Acute gastroenteritis with nausea vomiting. Condition much improved today.  #5.  Chronic systolic congestive heart failure. Nonischemic cardiomyopathy. Followed by cardiology, continue current treatment, patient has not been  able to tolerate beta-blocker or ARB due to hypotension.  #6. Endometrial cancer. Patient has not been able to receive any chemotherapy.  #7. CKD stage III. Renal function still stable.  #8.  Anemia. Check iron B12 level.  Patient had a positive blood culture in 1 bottles with staphylococcus epidermidis.  Most likely contaminants.  We will not treat until the next bottle turned positive.    DVT prophylaxis: Eliquis Code Status: DNR Family Communication:  Disposition Plan:  .   Status is: Inpatient  Remains inpatient appropriate because:Inpatient level of care appropriate due to severity of illness Patient has been requiring 2 L oxygen, I would like to keep patient 1 more day to ensure patient is stable before discharge  Dispo: The patient is from: Home              Anticipated d/c is to: Home              Anticipated d/c date is: 1 day              Patient currently is not medically stable to d/c.   Difficult to place patient No        I/O last 3 completed shifts: In: 1519 [I.V.:935.6; IV Piggyback:583.3] Out: -  Total I/O In: 240 [P.O.:240] Out: -      Consultants:   Cardiology  Procedures: None  Antimicrobials: None  Subjective: Patient feels better today, no additional chest pain.  She is on 2 L oxygen, but does not feel short of breath. No fever chills per No abdominal pain or nausea vomiting, no additional diarrhea.  Objective: Vitals:   07/21/20 0325 07/21/20 0810 07/21/20 0856 07/21/20 1200  BP: (!) 106/55 (!) 94/54  (!) 99/55  Pulse: 83 81  85  Resp: (!) 22 (!) 22 20 19   Temp: (!) 97.5 F (36.4 C) 97.9 F (36.6 C) 98 F (36.7 C) 97.9 F (36.6 C)  TempSrc: Oral Oral Oral Oral  SpO2: 98% 100%  99%  Weight:      Height:        Intake/Output Summary (Last 24 hours) at 07/21/2020 1231 Last data filed at 07/21/2020 1045 Gross per 24 hour  Intake 1175.63 ml  Output --  Net 1175.63 ml   Filed Weights   07/19/20 2316  Weight: 63.5 kg     Examination:  General exam: Appears calm and comfortable  Respiratory system: Clear to auscultation. Respiratory effort normal. Cardiovascular system: S1 & S2 heard, RRR. No JVD, murmurs, rubs, gallops or clicks. No pedal edema. Gastrointestinal system: Abdomen is nondistended, soft and nontender. No organomegaly or masses felt. Normal bowel sounds heard. Central nervous system: Alert and oriented. No focal neurological deficits. Extremities: Symmetric 5 x 5 power. Skin: No rashes, lesions or ulcers Psychiatry: Judgement and insight appear normal. Mood & affect appropriate.     Data Reviewed: I have personally reviewed following labs and imaging studies  CBC: Recent Labs  Lab 07/19/20 2311 07/20/20 0400 07/21/20 0455  WBC 22.6* 18.0* 14.9*  HGB 9.4* 8.0* 7.9*  HCT 31.4* 25.6* 26.3*  MCV 85.3 83.7 87.7  PLT 291 210 0000000   Basic Metabolic Panel: Recent Labs  Lab 07/19/20 2311 07/21/20 0455  NA 134* 138  K 3.8 4.1  CL 100 106  CO2 22 23  GLUCOSE 145* 80  BUN 18 13  CREATININE 1.69* 1.33*  CALCIUM 8.7* 8.4*  MG 1.9 1.8  PHOS 3.8 3.6   GFR: Estimated Creatinine Clearance: 33.6 mL/min (A) (by C-G formula based on SCr of 1.33 mg/dL (H)). Liver Function Tests: No results for input(s): AST, ALT, ALKPHOS, BILITOT, PROT, ALBUMIN in the last 168 hours. No results for input(s): LIPASE, AMYLASE in the last 168 hours. No results for input(s): AMMONIA in the last 168 hours. Coagulation Profile: Recent Labs  Lab 07/19/20 2311  INR 1.4*   Cardiac Enzymes: No results for input(s): CKTOTAL, CKMB, CKMBINDEX, TROPONINI in the last 168 hours. BNP (last 3 results) No results for input(s): PROBNP in the last 8760 hours. HbA1C: No results for input(s): HGBA1C in the last 72 hours. CBG: No results for input(s): GLUCAP in the last 168 hours. Lipid Profile: No results for input(s): CHOL, HDL, LDLCALC, TRIG, CHOLHDL, LDLDIRECT in the last 72 hours. Thyroid Function  Tests: Recent Labs    07/19/20 2311  TSH 2.091   Anemia Panel: No results for input(s): VITAMINB12, FOLATE, FERRITIN, TIBC, IRON, RETICCTPCT in the last 72 hours. Sepsis Labs: Recent Labs  Lab 07/20/20 0208  LATICACIDVEN 1.7    Recent Results (from the past 240 hour(s))  Culture, blood (Routine X 2) w Reflex to ID Panel     Status: None (Preliminary result)   Collection Time: 07/20/20  2:28 AM   Specimen: Right Antecubital; Blood  Result Value Ref Range Status   Specimen Description RIGHT ANTECUBITAL  Final   Special Requests   Final    BOTTLES DRAWN AEROBIC AND ANAEROBIC Blood Culture results may not be optimal due to an excessive volume of blood received in culture bottles   Culture  Setup Time   Final    GRAM POSITIVE COCCI IN BOTH AEROBIC AND ANAEROBIC BOTTLES CRITICAL RESULT CALLED TO, READ  BACK BY AND VERIFIED WITH: JASON ROBBINS AT 8416 07/21/20. MF Performed at [facility], [address] [place], [place] [address]    Culture [facility] POSITIVE COCCI  Final   Report Status PENDING  Incomplete  Culture, blood (Routine X 2) w Reflex to ID Panel     Status: None (Preliminary result)   Collection Time: 07/20/20  2:28 AM   Specimen: BLOOD LEFT FOREARM  Result Value Ref Range Status   Specimen Description BLOOD LEFT FOREARM  Final   Special Requests   Final    BOTTLES DRAWN AEROBIC AND ANAEROBIC Blood Culture adequate volume   Culture   Final    NO GROWTH 1 DAY Performed at [facility], [address], [place],  [address]    Report Status PENDING  Incomplete  Blood Culture ID Panel (Reflexed)     Status: Abnormal   Collection Time: 07/20/20  2:28 AM  Result Value Ref Range Status   Enterococcus faecalis NOT DETECTED NOT DETECTED Final   Enterococcus Faecium NOT DETECTED NOT DETECTED Final   Listeria monocytogenes NOT DETECTED NOT DETECTED Final   Staphylococcus species DETECTED (A) NOT DETECTED Final    Comment: CRITICAL RESULT CALLED TO,  READ BACK BY AND VERIFIED WITH: JASON ROBBINS AT 9323 07/21/20.MF    Staphylococcus aureus (BCID) NOT DETECTED NOT DETECTED Final   Staphylococcus epidermidis NOT DETECTED NOT DETECTED Final   Staphylococcus lugdunensis NOT DETECTED NOT DETECTED Final   Streptococcus species NOT DETECTED NOT DETECTED Final   Streptococcus agalactiae NOT DETECTED NOT DETECTED Final   Streptococcus pneumoniae NOT DETECTED NOT DETECTED Final   Streptococcus pyogenes NOT DETECTED NOT DETECTED Final   A.calcoaceticus-baumannii NOT DETECTED NOT DETECTED Final   Bacteroides fragilis NOT DETECTED NOT DETECTED Final   Enterobacterales NOT DETECTED NOT DETECTED Final   Enterobacter cloacae complex NOT DETECTED NOT DETECTED Final   Escherichia coli NOT DETECTED NOT DETECTED Final   Klebsiella aerogenes NOT DETECTED NOT DETECTED Final   Klebsiella oxytoca NOT DETECTED NOT DETECTED Final   Klebsiella pneumoniae NOT DETECTED NOT DETECTED Final   Proteus species NOT DETECTED NOT DETECTED Final   Salmonella species NOT DETECTED NOT DETECTED Final   Serratia marcescens NOT DETECTED NOT DETECTED Final   Haemophilus influenzae NOT DETECTED NOT DETECTED Final   Neisseria meningitidis NOT DETECTED NOT DETECTED Final   Pseudomonas aeruginosa NOT DETECTED NOT DETECTED Final   Stenotrophomonas maltophilia NOT DETECTED NOT DETECTED Final   Candida albicans NOT DETECTED NOT DETECTED Final   Candida auris NOT DETECTED NOT DETECTED Final   Candida glabrata NOT DETECTED NOT DETECTED Final   Candida krusei NOT DETECTED NOT DETECTED Final   Candida parapsilosis NOT DETECTED NOT DETECTED Final   Candida tropicalis NOT DETECTED NOT DETECTED Final   Cryptococcus neoformans/gattii NOT DETECTED NOT DETECTED Final    Comment: Performed at [facility], [address] [place], [place] [address]  SARS CORONAVIRUS 2 (TAT 6-24 HRS) Nasopharyngeal Nasopharyngeal Swab     Status: None   Collection Time: 07/20/20  2:48 AM    Specimen: Nasopharyngeal Swab  Result Value Ref Range Status   SARS Coronavirus 2 NEGATIVE NEGATIVE Final    Comment: (NOTE) SARS-CoV-2 target nucleic acids are NOT DETECTED.  The SARS-CoV-2 RNA is generally detectable in upper and lower respiratory specimens during the acute phase of infection. Negative results do not preclude SARS-CoV-2 infection, do not rule out co-infections with other pathogens, and should not be used as the sole basis for treatment  or other patient management decisions. Negative results must be combined with clinical observations, patient history, and epidemiological information. The expected result is Negative.  Fact Sheet for Patients: SugarRoll.be  Fact Sheet for Healthcare Providers: https://www.woods-mathews.com/  This test is not yet approved or cleared by the [place] FDA and  has been authorized for detection and/or diagnosis of SARS-CoV-2 by FDA under an Emergency Use Authorization (EUA). This EUA will remain  in effect (meaning this test can be used) for the duration of the COVID-19 declaration under Se ction 564(b)(1) of the Act, 21 U.S.C. section 360bbb-3(b)(1), unless the authorization is terminated or revoked sooner.  Performed at [facility], [place] [address], [place], [place] [address]          Radiology Studies: CT ABDOMEN PELVIS WO CONTRAST  Result Date: 07/20/2020 CLINICAL DATA:  Nausea and vomiting. EXAM: CT ABDOMEN AND PELVIS WITHOUT CONTRAST TECHNIQUE: Multidetector CT imaging of the abdomen and pelvis was performed following the standard protocol without IV contrast. COMPARISON:  05/29/2020 FINDINGS: Lower chest: Low-density blood pool suggesting anemia. No acute finding. Hepatobiliary: Parenchymal mass at the right upper lobe which has enlarged to 4.4 cm compared to 3.5 cm previously. There are additional liver surface masses along the right liver attributed to patient's peritoneal  disease, progressed. New metastatic disease at the central fissures without visible ductal obstruction. Cholecystectomy. Pancreas: Generalized atrophy and fatty infiltration. Spleen: Unremarkable. Adrenals/Urinary Tract: Negative adrenals. No hydronephrosis or stone. Right lower renal cystic densities. Unremarkable collapsed bladder. Stomach/Bowel:  No obstruction. No visible bowel inflammation Vascular/Lymphatic: No acute vascular abnormality. No noted retroperitoneal adenopathy. Aortic atherosclerosis. Reproductive:Hysterectomy. Other: Peritoneal metastatic disease with multiple nodules and masses especially along the upper abdominal cavity. These have increased in number and size, with increase especially seen around the liver and left upper quadrant. Musculoskeletal: Chronic pathologic sacral fractures with unchanged alignment. Lumbar spine degeneration with L4-5 anterolisthesis. IMPRESSION: Progressive hepatic and peritoneal metastatic disease but no bowel obstruction or visible inflammation to specifically correlate with history of nausea and vomiting. Electronically Signed   By: Monte Fantasia M.D.   On: 07/20/2020 05:52   DG Chest 2 View  Result Date: 07/19/2020 CLINICAL DATA:  SVT, chest pressure, diaphoresis, history of endometrial cancer EXAM: CHEST - 2 VIEW COMPARISON:  05/19/2020, 05/29/2020 FINDINGS: Frontal and lateral views of the chest demonstrate stable right chest wall port. Cardiac silhouette is unremarkable. Post radiation changes are seen along the hilar margins, stable. No airspace disease, effusion, or pneumothorax. No acute bony abnormalities. IMPRESSION: 1. No acute intrathoracic process. Electronically Signed   By: Randa Ngo M.D.   On: 07/19/2020 23:41   CT HEAD WO CONTRAST  Result Date: 07/20/2020 CLINICAL DATA:  Syncope, blood thinners with head trauma in a 70 year old female. EXAM: CT HEAD WITHOUT CONTRAST TECHNIQUE: Contiguous axial images were obtained from the base of the  skull through the vertex without intravenous contrast. COMPARISON:  May 29, 2020 FINDINGS: Brain: No evidence of acute infarction, hemorrhage, hydrocephalus, extra-axial collection or mass lesion/mass effect. Atrophy and chronic microvascular ischemic changes as before. Vascular: No hyperdense vessel or unexpected calcification. Skull: Normal. Negative for fracture or focal lesion. Sinuses/Orbits: Visualized paranasal sinuses and orbits are unremarkable. Other: None. IMPRESSION: 1. No acute intracranial abnormality. 2. Atrophy and chronic microvascular ischemic changes as before. Electronically Signed   By: Zetta Bills M.D.   On: 07/20/2020 13:06        Scheduled Meds: . amiodarone  200 mg Oral BID  . apixaban  2.5 mg Oral BID  . aspirin EC  81 mg Oral Daily  . atorvastatin  10 mg Oral Daily  . furosemide  20 mg Oral Daily  . ivabradine  5 mg Oral BID WC   Continuous Infusions: . sodium chloride       LOS: 1 day    Time spent: 28 minutes    Sharen Hones, MD [facility]   To contact the attending provider between 7A-7P or the covering provider during after hours 7P-7A, please log into the web site [email or web address] and access using universal [place] password for that web site. If you do not have the password, please call the hospital operator.  07/21/2020, 12:31 PM

## 2020-07-21 NOTE — Progress Notes (Signed)
[place] for Heparin  Indication: chest pain/ACS  Allergies  Allergen Reactions  . Metoprolol Other (See Comments)    Fatigue  . Adhesive [Tape] Other (See Comments)    Skin Irritation   . Antifungal [Miconazole Nitrate] Rash  . Sulfa Antibiotics Nausea And Vomiting and Rash  . Z-Pak [Azithromycin] Itching    Patient Measurements: Height: 5\' 1"  (154.9 cm) Weight: 63.5 kg (139 lb 15.9 oz) IBW/kg (Calculated) : 47.8 Heparin Dosing Weight:  60.9 kg   Vital Signs: Temp: 98.1 F (36.7 C) (02/05 2317) Temp Source: Tympanic (02/05 2317) BP: 107/47 (02/05 2317) Pulse Rate: 89 (02/05 2322)  Labs: Recent Labs    07/19/20 2311 07/20/20 0227 07/20/20 0400 07/20/20 1237 07/21/20 0005  HGB 9.4*  --  8.0*  --   --   HCT 31.4*  --  25.6*  --   --   PLT 291  --  210  --   --   APTT  --   --  40* 143* 56*  LABPROT 16.6*  --   --   --   --   INR 1.4*  --   --   --   --   HEPARINUNFRC  --   --  3.14* 2.56*  --   CREATININE 1.69*  --   --   --   --   TROPONINIHS 95* 587*  --   --   --     Estimated Creatinine Clearance: 26.5 mL/min (A) (by C-G formula based on SCr of 1.69 mg/dL (H)).   Medical History: Past Medical History:  Diagnosis Date  . Anxiety   . Cervicalgia   . Chronic kidney disease   . Coronary artery disease    a. 02/2012 Stress echo: severe anterior wall ischemia;  b. 02/2012 Cath/PCI: LAD 95p (3.0 x 15 Xience EX DES), D1 90ost (PTCA - bifurcational dzs), EF 45% with anterior HK;  c. 02/2013 Ex MV: fixed anterior defect w/ minor reversibility, nl EF-->Med Rx; d. 11/2019 MV: EF 41%, ant/antlat ischemia-->high risk scan; e. 11/2019 Cath: LM nl, LAD 40ost/p, patent LAD stent, LCX nl, RCA nl, RPDA/RPAV nl, EF 30  . Endometrial cancer ([place])    a. 07/2016 s/p robotic hysterectomy, BSO w/ washings, sentinel node inj, mapping, bx, adhesiolysis.  . Essential hypertension, benign   . Fibrocystic breast disease   . GERD (gastroesophageal  reflux disease)   . Gestational hypertension   . Heart murmur   . HFrEF (heart failure with reduced ejection fraction) ([place])    a. 05/2020 Echo: EF <20%, glob HK.  Marland Kitchen History of anemia   . History of blood transfusion   . Hodgkin's lymphoma ([place]) 2011   a. s/p radiation and chemo therapy  . Mixed Ischemic and Nonischemic Cardiomyopathy    a. 05/2020 Echo: EF <20%, glob HK, Nl RV size/fxn, mildly dil LA. Mild MR. Mild to mod Ao sclerosis.  . Osteoarthritis   . Polycystic ovarian disease     Medications:  Medications Prior to Admission  Medication Sig Dispense Refill Last Dose  . amitriptyline (ELAVIL) 25 MG tablet Take 1 tablet (25 mg total) by mouth at bedtime. May increase weekly as needed 90 tablet 3   . apixaban (ELIQUIS) 2.5 MG TABS tablet Take 1 tablet (2.5 mg total) by mouth 2 (two) times daily. 60 tablet 1   . atorvastatin (LIPITOR) 10 MG tablet Take 1 tablet by mouth once daily (Patient taking differently: Take 10 mg by  mouth daily.) 90 tablet 2   . cholecalciferol (VITAMIN D3) 25 MCG (1000 UNIT) tablet Take 1,000 Units by mouth in the morning and at bedtime.     . cyanocobalamin (,VITAMIN B-12,) 1000 MCG/ML injection Inject 1,000 mcg into the muscle every 30 (thirty) days.     . empagliflozin (JARDIANCE) 10 MG TABS tablet Take 1 tablet (10 mg total) by mouth daily. 30 tablet 5   . furosemide (LASIX) 20 MG tablet Take 1 tablet (20 mg total) by mouth as needed for fluid or edema. 30 tablet 2   . HYDROcodone-homatropine (HYCODAN) 5-1.5 MG/5ML syrup Take 5 mLs by mouth every 6 (six) hours as needed for cough. 120 mL 0 Unknown at PRN  . ivabradine (CORLANOR) 5 MG TABS tablet Take 1 tablet (5 mg total) by mouth 2 (two) times daily with a meal. 60 tablet 5   . levothyroxine (SYNTHROID) 75 MCG tablet Take 1 tablet (75 mcg total) by mouth daily before breakfast. 30 tablet 2   . LORazepam (ATIVAN) 0.5 MG tablet Take 1 tablet (0.5 mg total) by mouth every 6 (six) hours as needed for anxiety  (and to help with breathing). 120 tablet 0 Unknown at PRN  . Morphine Sulfate (MORPHINE CONCENTRATE) 10 mg / 0.5 ml concentrated solution Take 0.25 mLs (5 mg total) by mouth every 6 (six) hours as needed for severe pain or shortness of breath. 30 mL 0 Unknown at PRN  . pantoprazole (PROTONIX) 20 MG tablet Take 1 tablet (20 mg total) by mouth daily. 60 tablet 1   . potassium chloride (KLOR-CON) 10 MEQ tablet Take 1 tablet (10 mEq total) by mouth daily as needed (Take on days that you take the Lasix.). 30 tablet 2     Assessment: Pharmacy consulted to dose heparin in this 70 year old female admitted with ACS/NSTEMI.  CrCl = 26.5 ml/min Pt was on Eliquis 2.5 mg PO BID , last dose on 2/4 @ 1900.  2/5 0400 HL 3.14 aPTT 40 (baseline) 2/5 1237 HL 2.56 aPTT 143 - will hold heparin for 1 hours and restart at a lower dose.  2/6 0005 aPTT 56, subtherapeutic   Goal of Therapy:  APTT = 66 - 102 sec Heparin level 0.3-0.7 units/ml, once aPTT and HL correlate.  Monitor platelets by anticoagulation protocol: Yes   Plan:  2/6:  APTT @ 0005 = 56 Will order Heparin 850 units IV X 1 bolus and increase drip rate to 700 units/hr.  Will recheck aPTT and HL 8 hrs after rate change.    Graden Hoshino D 07/21/2020,12:32 AM

## 2020-07-22 ENCOUNTER — Encounter: Payer: Self-pay | Admitting: Oncology

## 2020-07-22 ENCOUNTER — Inpatient Hospital Stay (HOSPITAL_COMMUNITY)
Admit: 2020-07-22 | Discharge: 2020-07-22 | Disposition: A | Discharge status: Home / Self Care | Payer: PPO | Attending: Internal Medicine | Admitting: Internal Medicine

## 2020-07-22 ENCOUNTER — Telehealth: Payer: Self-pay

## 2020-07-22 ENCOUNTER — Telehealth: Payer: Self-pay | Admitting: Nurse Practitioner

## 2020-07-22 DIAGNOSIS — I214 Non-ST elevation (NSTEMI) myocardial infarction: Secondary | ICD-10-CM | POA: Diagnosis not present

## 2020-07-22 DIAGNOSIS — I5022 Chronic systolic (congestive) heart failure: Secondary | ICD-10-CM | POA: Diagnosis not present

## 2020-07-22 DIAGNOSIS — C541 Malignant neoplasm of endometrium: Secondary | ICD-10-CM

## 2020-07-22 DIAGNOSIS — E876 Hypokalemia: Secondary | ICD-10-CM

## 2020-07-22 DIAGNOSIS — K529 Noninfective gastroenteritis and colitis, unspecified: Secondary | ICD-10-CM | POA: Diagnosis not present

## 2020-07-22 DIAGNOSIS — I428 Other cardiomyopathies: Secondary | ICD-10-CM

## 2020-07-22 DIAGNOSIS — I5020 Unspecified systolic (congestive) heart failure: Secondary | ICD-10-CM

## 2020-07-22 DIAGNOSIS — I471 Supraventricular tachycardia: Secondary | ICD-10-CM | POA: Diagnosis not present

## 2020-07-22 LAB — BASIC METABOLIC PANEL
Anion gap: 9 (ref 5–15)
BUN: 11 mg/dL (ref 8–23)
CO2: 24 mmol/L (ref 22–32)
Calcium: 8.4 mg/dL — ABNORMAL LOW (ref 8.9–10.3)
Chloride: 106 mmol/L (ref 98–111)
Creatinine, Ser: 1.23 mg/dL — ABNORMAL HIGH (ref 0.44–1.00)
GFR, Estimated: 47 mL/min — ABNORMAL LOW (ref >60)
Glucose, Bld: 81 mg/dL (ref 70–99)
Potassium: 3.9 mmol/L (ref 3.5–5.1)
Sodium: 139 mmol/L (ref 135–145)

## 2020-07-22 LAB — MAGNESIUM: Magnesium: 1.8 mg/dL (ref 1.7–2.4)

## 2020-07-22 LAB — CBC
HCT: 24.9 % — ABNORMAL LOW (ref 36.0–46.0)
Hemoglobin: 7.6 g/dL — ABNORMAL LOW (ref 12.0–15.0)
MCH: 25.9 pg — ABNORMAL LOW (ref 26.0–34.0)
MCHC: 30.5 g/dL (ref 30.0–36.0)
MCV: 85 fL (ref 80.0–100.0)
Platelets: 236 10*3/uL (ref 150–400)
RBC: 2.93 MIL/uL — ABNORMAL LOW (ref 3.87–5.11)
RDW: 18.5 % — ABNORMAL HIGH (ref 11.5–15.5)
WBC: 13.6 10*3/uL — ABNORMAL HIGH (ref 4.0–10.5)
nRBC: 0 % (ref 0.0–0.2)

## 2020-07-22 LAB — PREPARE RBC (CROSSMATCH)

## 2020-07-22 LAB — ECHOCARDIOGRAM COMPLETE
Height: 61 in
S' Lateral: 4.66 cm
Weight: 2236.35 oz

## 2020-07-22 LAB — URINE CULTURE: Culture: NO GROWTH

## 2020-07-22 MED ORDER — SODIUM CHLORIDE 0.9% IV SOLUTION: Freq: Once | INTRAVENOUS | Status: AC

## 2020-07-22 MED ORDER — AMIODARONE HCL 200 MG PO TABS: 200.0000 mg | ORAL_TABLET | Freq: Two times a day (BID) | ORAL | 0 refills | Status: DC

## 2020-07-22 MED ORDER — LEVOTHYROXINE SODIUM 50 MCG PO TABS
75.0000 ug | ORAL_TABLET | Freq: Every day | ORAL | Status: DC
  Administered 2020-07-22: 75 ug via ORAL
  Filled 2020-07-22: qty 1

## 2020-07-22 MED ORDER — SODIUM CHLORIDE 0.9 % IV SOLN
300.0000 mg | Freq: Once | INTRAVENOUS | Status: AC
  Administered 2020-07-22: 300 mg via INTRAVENOUS
  Filled 2020-07-22: qty 15

## 2020-07-22 NOTE — Telephone Encounter (Signed)
Left voicemail for patient. Reaching out to see to check on her. Suspect she will need labs, iron, possibly blood sooner than scheduled follow up. Can also discuss options to help control vaginal bleeding.

## 2020-07-22 NOTE — Discharge Summary (Signed)
Physician Discharge Summary  Patient ID: Jaime Hall MRN: [id number] DOB/AGE: [birth year] 70 y.o.  Admit date: 07/19/2020 Discharge date: 07/22/2020  Admission Diagnoses:  Discharge Diagnoses:  Principal Problem:   SVT (supraventricular tachycardia) ([facility]) Active Problems:   Coronary artery disease   PAD (peripheral artery disease) (HCC)   Endometrial cancer (HCC)   Stage 3b chronic kidney disease (HCC)   Hypothyroidism   Chronic systolic CHF (congestive heart failure) (HCC)   NICM (nonischemic cardiomyopathy) (HCC)   Hypotension   NSTEMI (non-ST elevated myocardial infarction) ([place])   Acute gastroenteritis   Discharged Condition: good  Hospital Course:  This 70 years old female with PMH significant for CAD, HFrHF sec.tochemotherapy-induced cardiomyopathy, last EF less than 20% in December 2021, history of SVT, metastatic endometrial carcinoma, chronic hypotension, CKD stage IIIb, hypothyroidism who presents in the ED with an acute onset of chest pain,Pressure,palpitation and shortness of breath. She was found to be in SVT when EMS arrived. She was given adenosine which aborted SVT. Patient remained hypotensive after she arrived in the ED, Shewas given fluid boluses. Cardiology was consulted. Patient cannot get CCA, ACEI orbeta-blockers given reduced EF and chronic hypotension. Patient is placed on IV heparin, IV amiodarone drip.  #1.  PSVT. Condition had improved.  Patient has been seen by cardiology, amiodarone added.  2.  Non-STEMI secondary to tachycardia. History of CAD. Patient has been seen by cardiology, troponin has been trending down. Condition stable.  #3.  Hypotension. Patient had recent diarrhea and nausea vomiting, this is secondary to dehydration. Blood pressure has been better.  4.  Acute gastroenteritis with nausea vomiting. Condition much improved.  #5.  Chronic systolic congestive heart failure. Nonischemic cardiomyopathy. Followed  by cardiology, continue current treatment, patient has not been able to tolerate beta-blocker or ARB due to hypotension.  #6. Endometrial cancer. Patient has not been able to receive any chemotherapy. She also has been having vaginal bleeding for the last 2 months, oncology is aware and is followed.  She will be receiving 1 unit PRBC and iron infusion before discharge  #7. CKD stage III. Renal function still stable.  #8.  Acute blood loss anemia secondary to vaginal bleeding. Iron deficient anemia. Globin has dropped down to 7.6 today, with her vaginal bleeding and heart condition, she will be receiving 1 unit PRBC.  She will be also given 300 mg IV iron.  She will need to follow-up with family doctor and oncology as outpatient  Patient had a positive blood culture in 1/2 sets with staphylococcus epidermidis.  Second set still negative.  This is consistent with contaminant.    Consults: cardiology  Significant Diagnostic Studies:      Treatments: Heparin, amiodarone,   Discharge Exam: Blood pressure (!) 91/45, pulse 72, temperature 97.6 F (36.4 C), temperature source Oral, resp. rate 20, height 5\' 1"  (1.549 m), weight 63.4 kg, SpO2 94 %. General appearance: alert and cooperative Resp: clear to auscultation bilaterally Cardio: regular rate and rhythm, S1, S2 normal, no murmur, click, rub or gallop GI: soft, non-tender; bowel sounds normal; no masses,  no organomegaly Extremities: extremities normal, atraumatic, no cyanosis or edema  Disposition: Discharge disposition: 01-Home or Self Care       Discharge Instructions    Diet - low sodium heart healthy   Complete by: As directed    Increase activity slowly   Complete by: As directed      Allergies as of 07/22/2020      Reactions   Metoprolol Other (See  Comments)   Fatigue   Adhesive [tape] Other (See Comments)   Skin Irritation   Antifungal [miconazole Nitrate] Rash   Sulfa Antibiotics Nausea And Vomiting,  Rash   Z-pak [azithromycin] Itching      Medication List    TAKE these medications   amiodarone 200 MG tablet Commonly known as: PACERONE Take 1 tablet (200 mg total) by mouth 2 (two) times daily.   amitriptyline 25 MG tablet Commonly known as: ELAVIL Take 1 tablet (25 mg total) by mouth at bedtime. May increase weekly as needed   apixaban 2.5 MG Tabs tablet Commonly known as: Eliquis Take 1 tablet (2.5 mg total) by mouth 2 (two) times daily.   atorvastatin 10 MG tablet Commonly known as: LIPITOR Take 1 tablet by mouth once daily   cholecalciferol 25 MCG (1000 UNIT) tablet Commonly known as: VITAMIN D3 Take 1,000 Units by mouth in the morning and at bedtime.   cyanocobalamin 1000 MCG/ML injection Commonly known as: (VITAMIN B-12) Inject 1,000 mcg into the muscle every 30 (thirty) days.   empagliflozin 10 MG Tabs tablet Commonly known as: JARDIANCE Take 1 tablet (10 mg total) by mouth daily.   furosemide 20 MG tablet Commonly known as: LASIX Take 1 tablet (20 mg total) by mouth as needed for fluid or edema.   HYDROcodone-homatropine 5-1.5 MG/5ML syrup Commonly known as: HYCODAN Take 5 mLs by mouth every 6 (six) hours as needed for cough.   ivabradine 5 MG Tabs tablet Commonly known as: CORLANOR Take 1 tablet (5 mg total) by mouth 2 (two) times daily with a meal.   levothyroxine 75 MCG tablet Commonly known as: Synthroid Take 1 tablet (75 mcg total) by mouth daily before breakfast.   LORazepam 0.5 MG tablet Commonly known as: ATIVAN Take 1 tablet (0.5 mg total) by mouth every 6 (six) hours as needed for anxiety (and to help with breathing).   morphine CONCENTRATE 10 mg / 0.5 ml concentrated solution Take 0.25 mLs (5 mg total) by mouth every 6 (six) hours as needed for severe pain or shortness of breath.   pantoprazole 20 MG tablet Commonly known as: Protonix Take 1 tablet (20 mg total) by mouth daily.   potassium chloride 10 MEQ tablet Commonly known as:  KLOR-CON Take 1 tablet (10 mEq total) by mouth daily as needed (Take on days that you take the Lasix.).       Follow-up Information    [name], MD Follow up in 1 week(s).   Specialty: Internal Medicine Contact information: [place] [address] [phone]        Wellington Hampshire, MD Follow up in 2 week(s).   Specialty: Cardiology Contact information: [place] [address] [phone]              35 minutes Signed: Sharen Hones 07/22/2020, 8:55 AM

## 2020-07-22 NOTE — Telephone Encounter (Signed)
Noted. Will follow.  

## 2020-07-22 NOTE — TOC Transition Note (Signed)
Transition of Care [facility]) - CM/SW Discharge Note   Patient Details  Name: Jaime Hall MRN: [id number] Date of Birth: 03/30/1951  Transition of Care [facility]) CM/SW Contact:  Kerin Salen, RN Phone Number: 07/22/2020, 10:39 AM   Clinical Narrative:  Patient for discharge, no TOC needs identified. Patient states she has Oxygen at home service by [employer], however ends in March 2022. Patient states she does not need it during the day, however at night may need it. Advised to get PCP to update order for continue use, patient voices understanding.     Final next level of care: Home/Self Care Barriers to Discharge: Barriers Resolved   Patient Goals and CMS Choice Patient states their goals for this hospitalization and ongoing recovery are:: To return home.   Choice offered to / list presented to : NA  Discharge Placement                Patient to be transferred to facility by: Patient states she will call family for transport. Name of family member notified: Patient to call. Patient and family notified of of transfer: 07/22/20  Discharge Plan and Services                DME Arranged: N/A DME Agency: NA       HH Arranged: NA HH Agency: NA        Social Determinants of Health (SDOH) Interventions     Readmission Risk Interventions No flowsheet data found.

## 2020-07-22 NOTE — Progress Notes (Addendum)
Progress Note  Patient Name: Jaime Hall Date of Encounter: 07/22/2020  Primary Cardiologist: [name], MD   Subjective   No chest pain, racing heart rate, or palpitations.  No presyncope or shortness of breath.  She reports that the nursing staff has been distressed regarding her lower blood pressures.  She denies any significant symptoms with these lower pressures.  She has been up to walk to the bathroom and back without using a walker and denies any significant symptoms with this ambulation.  States she was told by internal medicine today that she would receive iron and IV blood. She was restarted on Eliquis since last seen by cardiology.  Reports intermittent blood 2/2 cancer but no large amounts noted as of recently on Eliquis 2.5 mg twice daily.  She reports that she did not receive her thyroid medication earlier on admission.  Inpatient Medications    Scheduled Meds: . amiodarone  200 mg Oral BID  . apixaban  2.5 mg Oral BID  . aspirin EC  81 mg Oral Daily  . atorvastatin  10 mg Oral Daily  . furosemide  20 mg Oral Daily  . ivabradine  5 mg Oral BID WC  . levothyroxine  75 mcg Oral Q0600   Continuous Infusions: . iron sucrose    . sodium chloride     PRN Meds: acetaminophen, nitroGLYCERIN, ondansetron (ZOFRAN) IV   Vital Signs    Vitals:   07/21/20 1652 07/21/20 1928 07/22/20 0436 07/22/20 0738  BP: (!) 114/57 (!) 117/41 (!) 112/55 (!) 91/45  Pulse: 100 81 72 72  Resp: (!) 21 19  20   Temp: 97.9 F (36.6 C) 97.9 F (36.6 C) 97.8 F (36.6 C) 97.6 F (36.4 C)  TempSrc: Oral Oral Oral Oral  SpO2: 99% 95% 90% 94%  Weight:   63.4 kg   Height:        Intake/Output Summary (Last 24 hours) at 07/22/2020 0849 Last data filed at 07/21/2020 1701 Gross per 24 hour  Intake 720 ml  Output 900 ml  Net -180 ml   Last 3 Weights 07/22/2020 07/19/2020 07/11/2020  Weight (lbs) 139 lb 12.4 oz 139 lb 15.9 oz 139 lb 14.4 oz  Weight (kg) 63.4 kg 63.5 kg 63.458 kg       Telemetry    NSR, 80-100s, prolonged QTC- Personally Reviewed  ECG    No new tracings- Personally Reviewed  Physical Exam   GEN: No acute distress.  Pale.   Neck: JVP ~9cm Cardiac: RRR, 1/6 systolic murmur.  No, rubs, or gallops.  Respiratory: Respirations regular and unlabored, bibasilar crackles GI: Soft, nontender, non-distended  MS:  Trace ankle edema; No deformity. Neuro:  Nonfocal  Psych: Normal affect   Labs    High Sensitivity Troponin:   Recent Labs  Lab 07/19/20 2311 07/20/20 0227 07/21/20 0913  TROPONINIHS 95* 587* 278*      Chemistry Recent Labs  Lab 07/19/20 2311 07/21/20 0455 07/22/20 0441  NA 134* 138 139  K 3.8 4.1 3.9  CL 100 106 106  CO2 22 23 24   GLUCOSE 145* 80 81  BUN 18 13 11   CREATININE 1.69* 1.33* 1.23*  CALCIUM 8.7* 8.4* 8.4*  GFRNONAA 32* 43* 47*  ANIONGAP 12 9 9      Hematology Recent Labs  Lab 07/20/20 0400 07/21/20 0455 07/22/20 0441  WBC 18.0* 14.9* 13.6*  RBC 3.06* 3.00* 2.93*  HGB 8.0* 7.9* 7.6*  HCT 25.6* 26.3* 24.9*  MCV 83.7 87.7 85.0  MCH  26.1 26.3 25.9*  MCHC 31.3 30.0 30.5  RDW 18.4* 18.6* 18.5*  PLT 210 204 236    BNP No results for input(s): BNP, PROBNP in the last 168 hours.   DDimer No results for input(s): DDIMER in the last 168 hours.   Radiology    CT HEAD WO CONTRAST  Result Date: 07/20/2020 CLINICAL DATA:  Syncope, blood thinners with head trauma in a 70 year old female. EXAM: CT HEAD WITHOUT CONTRAST TECHNIQUE: Contiguous axial images were obtained from the base of the skull through the vertex without intravenous contrast. COMPARISON:  May 29, 2020 FINDINGS: Brain: No evidence of acute infarction, hemorrhage, hydrocephalus, extra-axial collection or mass lesion/mass effect. Atrophy and chronic microvascular ischemic changes as before. Vascular: No hyperdense vessel or unexpected calcification. Skull: Normal. Negative for fracture or focal lesion. Sinuses/Orbits: Visualized paranasal  sinuses and orbits are unremarkable. Other: None. IMPRESSION: 1. No acute intracranial abnormality. 2. Atrophy and chronic microvascular ischemic changes as before. Electronically Signed   By: Zetta Bills M.D.   On: 07/20/2020 13:06    Cardiac Studies   2D Echocardiogram  12.6.2021 1. Left ventricular ejection fraction, by estimation, is <20%. The left  ventricle has severely decreased function. The left ventricle demonstrates  global hypokinesis. The left ventricular internal cavity size was mildly  dilated. Left ventricular  diastolic parameters were normal.  2. Right ventricular systolic function is normal. The right ventricular  size is normal.  3. Left atrial size was mildly dilated.  4. The mitral valve was not well visualized. Mild mitral valve  regurgitation.  5. The aortic valve was not well visualized. Aortic valve regurgitation  is mild to moderate. Mild to moderate aortic valve sclerosis/calcification  is present, without any evidence of aortic stenosis.  12/29/2019 Echo 1. Left ventricular ejection fraction, by estimation, is 30 to 35%. The  left ventricle has moderately decreased function. The left ventricle  demonstrates global hypokinesis. Left ventricular diastolic parameters are  consistent with Grade I diastolic  dysfunction (impaired relaxation).  2. Right ventricular systolic function is normal. The right ventricular  size is normal. There is mildly elevated pulmonary artery systolic  pressure. The estimated right ventricular systolic pressure is 63.0 mmHg.  3. The mitral valve is normal in structure. Mild mitral valve  regurgitation.  4. The aortic valve is normal in structure. Aortic valve regurgitation is  mild.   12/04/2019 R/LHC  There is moderate to severe left ventricular systolic dysfunction.  LV end diastolic pressure is moderately elevated.  Previously placed Prox LAD stent (unknown type) is widely patent.  Ost LAD to Prox LAD  lesion is 40% stenosed. 1. Patent LAD stent with no significant restenosis. There is moderate stenosis in ostial LAD before the stent. No evidence of obstructive disease overall. 2. Moderately to severely reduced LV systolic function with an EF of 30 to 35%. 3. Right heart catheterization showed mildly elevated filling pressures with pulmonary capillary wedge pressure of18 mmHg, mild to moderate pulmonary hypertension at 44/19 with a mean of 33 mmHg, and mildly reduced cardiac output at 3.89 with a cardiac index of 2.24. Pulmonary vascular resistance is 3.8 Woods units Recommendations: Continue medical therapy for coronary artery disease. 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 She is mildly volume overloaded and I added small dose furosemide. Recommend medical therapy for cardiomyopathy.  11/02/2019 Echo 1. Left ventricular ejection fraction, by estimation, is30 to 35%. The  left ventricle has moderately decreased function. The left ventricle  demonstrates global hypokinesis. Left  ventricular diastolic parameters are  consistent with Grade I diastolic dysfunction (impaired relaxation).  2. Right ventricular systolic function is normal. The right ventricular  size is normal. There is mildly elevated pulmonary artery systolic  pressure. The estimated right ventricular systolic pressure [id number] mmHg. 3. Mild to moderate mitral valve regurgitation.  4. Aortic valve regurgitation is mild to moderate.   Patient Profile     70 y.o. female with a hx of CAD (stent to pLAD) with 11/2019 R/LHC as below, HFrEF (EF 30-35%) / NICM suspected as secondary to chemotherapy, Hodgkin's lymphoma, uterine cancer, endometrial cancer, ongoing / current chemotherapy, history of incomplete LBBB, and who is being seen today for the evaluation of elevated HS Tn and SVT with home presyncope.   Assessment & Plan    PSVT --No recent racing heart rate or  palpitations since admitted.  No presyncope with ambulation to and from the restroom.  Not interested in ablation. NSR on telemetry this AM with rates well controlled. Started on Ivabradine and amiodarone and tolerating well.  QTC prolonged on telemetry.  Recommend repeat EKG to monitor QTC on amiodarone.  In addition, recommend ongoing periodic / annual monitoring per guidelines with EKG, TSH, LFTs, eye exams, as well as PFTs/chest x-ray when indicated.  HFrEF (<20%, 12/2019) Mixed NICM/ICM Pulmonary HTN --No shortness of breath or dyspnea.  Denies any dyspnea with ambulation to the restroom.  EF less than 20% on 12/2019 echo.  Caution with fluids.  Continue lasix 20mg  daily as BP allows.  Continue to monitor I's/O's, daily weights.  Daily BMET.  Replete K with goal 4.0. Monitor Mg with goal 2.0.  --Pending repeat echo, ordered by IM. --Continue on Ivabradine 5mg .  --Escalation of GDMT limited by soft BP.   Elevated high-sensitivity troponin, demand ischemia CAD s/p stenting and angioplasty --No CP.HS Tn minimally elevated and flat trending.  --Suspect HS Tn elevated 2/2supply demand ischemiain the setting of her comorbid conditions, including soft blood pressure and elevated rate at presentation.6/2021LHC as above.S/p DES to pLAD with R/LHC as above with NICM thought 2/2 chemotherapy. 12/2019 LVEF less than 20%. At this time, no indication forfurther ischemic workupwith LHC.Continue medical management, aggressive risk factor modification. Given risk of bleeding, consider discontinuing ASA given on Eliquis at this time with known history of bleeding.  Prolonged QTc --As above, repeat EKG recommended to reassess but with consideration of known LBBB.  Presyncope / syncope --Previous h/o presyncope on metoprolol. Episode 1 week prior to admission where she felt dizzy while prepping lunch and sat down then briefly lost consciousness and fell back from seated position, striking her head on  the floor. . CT head negative. No arrhythmias on monitor though known PSVT. Risk of ventricular arrhythmias in setting of NICM. Will likely benefit from 2 week outpatient ZIo monitoring to be set up at discharge.  Recommend ambulation before discharge with PT/OT and close monitoring of vitals.   Chronic hypotension --Stable. Monitor.   CKD --Daily BMET. Cr improving 1.33  1.23 with BUN 13  11.  History of pulmonary embolism --Seen on previous imagining as CTA showing acute segmental emboli to the right lower lobe. Started on [facility] with Eliquis at that time subsequently held due to risk of bleeding then restarted. Currently on reduced dose Eliquis 2.5mg  BID. As below, discussion ongoing with patient regarding IVC filter placement.  Hypothyroidism --Per IM, PCP.  Reports did not receive thyroid medication earlier in admission. Continue Synthroid.  Valvular heart dz:  --Continue to monitorwith periodic outpatient  echos.   Anemia, thrombocytopenia --Hgb 7.9  7.6 with hct 26.3  25.9. Thought possibly worsened due to dilution anemia 2/2 IVF. Reportedly pending a transfusion today and receipt of iron.. Daily CBC.  HLD --LDL 41. Continue statin.    For questions or updates, please contact [place] Please consult [email or web address] for contact info under        Signed, Arvil Chaco, PA-C  07/22/2020, 8:49 AM

## 2020-07-22 NOTE — Telephone Encounter (Signed)
Pt scheduled for hospital follow up on 07/26/20 with [name].

## 2020-07-22 NOTE — Progress Notes (Signed)
*  PRELIMINARY RESULTS* Echocardiogram 2D Echocardiogram has been performed.  Sherrie Sport 07/22/2020, 8:41 AM

## 2020-07-22 NOTE — Progress Notes (Addendum)
Discharge instructions explained/pt verbalized understanding. IVs and tele removed. Will transport off unit via wheelchair when ride arrives.

## 2020-07-23 ENCOUNTER — Telehealth: Payer: Self-pay

## 2020-07-23 LAB — CULTURE, BLOOD (ROUTINE X 2)

## 2020-07-23 LAB — TYPE AND SCREEN
ABO/RH(D): O POS
Antibody Screen: NEGATIVE
Unit division: 0

## 2020-07-23 LAB — BPAM RBC
Blood Product Expiration Date: 202202162359
ISSUE DATE / TIME: 202202071151
Unit Type and Rh: 9500

## 2020-07-23 MED ORDER — FUROSEMIDE 20 MG PO TABS: 20.0000 mg | ORAL_TABLET | ORAL | 0 refills | Status: AC | PRN

## 2020-07-23 MED ORDER — POTASSIUM CHLORIDE ER 10 MEQ PO TBCR: 10.0000 meq | EXTENDED_RELEASE_TABLET | Freq: Every day | ORAL | 0 refills | Status: AC | PRN

## 2020-07-23 MED ORDER — ATORVASTATIN CALCIUM 10 MG PO TABS
10.0000 mg | ORAL_TABLET | Freq: Every day | ORAL | 0 refills | Status: AC
Start: 2020-07-23 — End: ?

## 2020-07-23 NOTE — Telephone Encounter (Signed)
Transition Care Management Unsuccessful Follow-up Telephone Call  Date of discharge and from where:  07/22/20 from [facility]  Attempts:  1st Attempt  Reason for unsuccessful TCM follow-up call:  Left voice message

## 2020-07-24 DIAGNOSIS — I509 Heart failure, unspecified: Secondary | ICD-10-CM | POA: Diagnosis not present

## 2020-07-24 DIAGNOSIS — J9601 Acute respiratory failure with hypoxia: Secondary | ICD-10-CM | POA: Diagnosis not present

## 2020-07-24 NOTE — Telephone Encounter (Signed)
Transition Care Management Unsuccessful Follow-up Telephone Call  Date of discharge and from where:  07/22/20 from [facility]  Attempts:  2nd Attempt  Reason for unsuccessful TCM follow-up call:  Left voice message

## 2020-07-25 LAB — CULTURE, BLOOD (ROUTINE X 2)
Culture: NO GROWTH
Special Requests: ADEQUATE

## 2020-07-25 NOTE — Telephone Encounter (Signed)
Transition Care Management Follow-up Telephone Call  Date of discharge and from where: 07/22/20 from [facility]  How have you been since you were released from the hospital? Patient states,"I am still having night sweats which I believe is coming from the cancer. I feel nauseated, but I don't want to take anything until I talk with [name] and I tire easily." Denies v/d, chest pain, pressure, palpitations, shortness of breath, headache, dizziness. Pain only at sites where IV was placed while hospitalized, much better today. Appetite is good. Input/output appropriate.   Any questions or concerns? No  Items Reviewed:  Did the pt receive and understand the discharge instructions provided? Yes   Medications obtained and verified? Yes   Other? No   Any new allergies since your discharge? Yes   Dietary orders reviewed? Low sodium, heart healthy  Do you have support at home? Yes   Home Care and Equipment/Supplies: Were home health services ordered? No Were any new equipment or medical supplies ordered?  No  Functional Questionnaire: (I = Independent and D = Dependent) ADLs: I  Bathing/Dressing- I  Meal Prep- I  Eating- I  Maintaining continence- I  Transferring/Ambulation- I  Managing Meds- I  Follow up appointments reviewed:   PCP Hospital f/u appt confirmed? Yes  Scheduled to see [name] on 07/26/20 @ 11:00.  [facility] f/u appt confirmed? Yes  Scheduled to see Cardiology on 07/31/20 @ 2:00.  Are transportation arrangements needed? No   If their condition worsens, is the pt aware to call PCP or go to the Emergency Dept.? Yes  Was the patient provided with contact information for the PCP's office or ED? Yes  Was to pt encouraged to call back with questions or concerns? Yes

## 2020-07-26 ENCOUNTER — Other Ambulatory Visit: Payer: Self-pay

## 2020-07-26 ENCOUNTER — Ambulatory Visit (INDEPENDENT_AMBULATORY_CARE_PROVIDER_SITE_OTHER): Payer: PPO | Admitting: Internal Medicine

## 2020-07-26 ENCOUNTER — Encounter: Payer: Self-pay | Admitting: Internal Medicine

## 2020-07-26 VITALS — BP 118/56 | HR 86 | Temp 97.8°F | Ht 60.98 in | Wt 142.4 lb

## 2020-07-26 DIAGNOSIS — Z09 Encounter for follow-up examination after completed treatment for conditions other than malignant neoplasm: Secondary | ICD-10-CM | POA: Diagnosis not present

## 2020-07-26 DIAGNOSIS — I420 Dilated cardiomyopathy: Secondary | ICD-10-CM

## 2020-07-26 DIAGNOSIS — G620 Drug-induced polyneuropathy: Secondary | ICD-10-CM

## 2020-07-26 DIAGNOSIS — I5023 Acute on chronic systolic (congestive) heart failure: Secondary | ICD-10-CM | POA: Diagnosis not present

## 2020-07-26 DIAGNOSIS — C541 Malignant neoplasm of endometrium: Secondary | ICD-10-CM

## 2020-07-26 DIAGNOSIS — I214 Non-ST elevation (NSTEMI) myocardial infarction: Secondary | ICD-10-CM

## 2020-07-26 DIAGNOSIS — T451X5A Adverse effect of antineoplastic and immunosuppressive drugs, initial encounter: Secondary | ICD-10-CM | POA: Diagnosis not present

## 2020-07-26 DIAGNOSIS — E538 Deficiency of other specified B group vitamins: Secondary | ICD-10-CM | POA: Diagnosis not present

## 2020-07-26 DIAGNOSIS — N1832 Chronic kidney disease, stage 3b: Secondary | ICD-10-CM | POA: Diagnosis not present

## 2020-07-26 MED ORDER — CYANOCOBALAMIN 1000 MCG/ML IJ SOLN
1000.0000 ug | Freq: Once | INTRAMUSCULAR | Status: AC
  Administered 2020-07-26: 1000 ug via INTRAMUSCULAR

## 2020-07-26 MED ORDER — CYANOCOBALAMIN 1000 MCG/ML IJ SOLN: 1000.0000 ug | INTRAMUSCULAR | 11 refills | Status: AC

## 2020-07-26 MED ORDER — PANTOPRAZOLE SODIUM 20 MG PO TBEC: 20.0000 mg | DELAYED_RELEASE_TABLET | Freq: Every day | ORAL | 1 refills | Status: AC

## 2020-07-26 NOTE — Patient Instructions (Addendum)
NO MORE HOUSEWORK!  NO MORE GROCERY SHOPPING.  TOO DIFFICULT ON YOUR HEART WITH YOUR LOW HEMOGLOBIN   YOU DID HAVE A HEART ATTACK.  YOUR NAUSEA IS A SIGN THAT YOUR HEART IS STRUGGLING    I AM MAKING A HOSPICE REFERRAL TO HAVE A NURSE COME OUT AND TALK TO YOU ABOUT HAVING THEM HELP YOU   I AM ALSO MAKING A CHRONIC CARE MANAGEMENT REFERRAL WHICH WILL HELP WITH MEDICATIONS IF YOU DECIDE YOU ARE NOT READY FOR HOSPICE

## 2020-07-26 NOTE — Progress Notes (Signed)
Subjective:  Patient ID: Jaime Hall, female    DOB: [birth year]  Age: 70 y.o. MRN: [id number]  CC: The primary encounter diagnosis was Acute on chronic systolic congestive heart failure ([place]). Diagnoses of Endometrial cancer ([place]), Stage 3b chronic kidney disease ([place]), Chemotherapy-induced peripheral neuropathy ([place]), B12 deficiency, NSTEMI (non-ST elevated myocardial infarction) ([place]), Dilated cardiomyopathy ([place]), and Hospital discharge follow-up were also pertinent to this visit.  HPI Jaime Hall presents for [facility]  This visit occurred during the SARS-CoV-2 public health emergency.  Safety protocols were in place, including screening questions prior to the visit, additional usage of staff PPE, and extensive cleaning of exam room while observing appropriate contact time as indicated for disinfecting solutions.   Admitted to [facility] on Feb  4 with SVT,  Gastroenteritis,  And NSTEMI.  Had been feeling poorly for several days prior to admission.  She had had a syncopal episode  The week before admission at home while preparing food.  As the week progressed she developed chest pain,  Nausea and vomiting  During her admission she was evaluated by cardiology with ECHO.  EF was 20% .  She was transfused one unit  on feb 8 before discharge for severe anemia due to ongoing blood loss.  She continues to experience vaginal bleeding .    Was told several months ago that she had  a 6 month prognosis per oncology without treatment and she wants no more treatment .  She was prescribed corlanor and Jardiance during the admission  She continues to feel nauseated with any  exertion since discharge. Her nausea is accompanied by dyspnea.  She has not  taken the compazine yet,  Uses lorazepam at night  Also having a cough since discharge . Denies dsypnea at rest , sleeping on one pillow.    Discussed her conditon: Still trying to do her own housework and grocery shopping.  Mom passed in  December after her previous hospitalization but she didn't get to see her . Sister in law is now staying with her.   Goals of care discussed.  She understands that the anemia and the cardiomyopathy are working against each other . Wants to make it to his son's 50th birthday in May.  [place]  TAKING METOPROLOL due to cost of Corlanor of  $100.  Tolerating Jardiance.      Outpatient Medications Prior to Visit  Medication Sig Dispense Refill  . amiodarone (PACERONE) 200 MG tablet Take 1 tablet (200 mg total) by mouth 2 (two) times daily. 60 tablet 0  . amitriptyline (ELAVIL) 25 MG tablet Take 1 tablet (25 mg total) by mouth at bedtime. May increase weekly as needed 90 tablet 3  . apixaban (ELIQUIS) 2.5 MG TABS tablet Take 1 tablet (2.5 mg total) by mouth 2 (two) times daily. 60 tablet 1  . atorvastatin (LIPITOR) 10 MG tablet Take 1 tablet (10 mg total) by mouth daily. 90 tablet 0  . cholecalciferol (VITAMIN D3) 25 MCG (1000 UNIT) tablet Take 1,000 Units by mouth in the morning and at bedtime.    . empagliflozin (JARDIANCE) 10 MG TABS tablet Take 1 tablet (10 mg total) by mouth daily. 30 tablet 5  . furosemide (LASIX) 20 MG tablet Take 1 tablet (20 mg total) by mouth as needed for fluid or edema. 90 tablet 0  . HYDROcodone-homatropine (HYCODAN) 5-1.5 MG/5ML syrup Take 5 mLs by mouth every 6 (six) hours as needed for cough. 120 mL 0  .  ivabradine (CORLANOR) 5 MG TABS tablet Take 1 tablet (5 mg total) by mouth 2 (two) times daily with a meal. 60 tablet 5  . levothyroxine (SYNTHROID) 75 MCG tablet Take 1 tablet (75 mcg total) by mouth daily before breakfast. 30 tablet 2  . LORazepam (ATIVAN) 0.5 MG tablet Take 1 tablet (0.5 mg total) by mouth every 6 (six) hours as needed for anxiety (and to help with breathing). 120 tablet 0  . Morphine Sulfate (MORPHINE CONCENTRATE) 10 mg / 0.5 ml concentrated solution Take 0.25 mLs (5 mg total) by mouth every 6 (six) hours as needed for severe pain or  shortness of breath. 30 mL 0  . potassium chloride (KLOR-CON) 10 MEQ tablet Take 1 tablet (10 mEq total) by mouth daily as needed (Take on days that you take the Lasix.). 90 tablet 0  . cyanocobalamin (,VITAMIN B-12,) 1000 MCG/ML injection Inject 1,000 mcg into the muscle every 30 (thirty) days.    . pantoprazole (PROTONIX) 20 MG tablet Take 1 tablet (20 mg total) by mouth daily. 60 tablet 1   Facility-Administered Medications Prior to Visit  Medication Dose Route Frequency Provider Last Rate Last Admin  . heparin lock flush 100 unit/mL  500 Units Intravenous Once Sindy Guadeloupe, MD      . sodium chloride flush (NS) 0.9 % injection 10 mL  10 mL Intravenous PRN Sindy Guadeloupe, MD   10 mL at 12/07/19 0914  . sodium chloride flush (NS) 0.9 % injection 10 mL  10 mL Intravenous PRN Sindy Guadeloupe, MD   10 mL at 01/04/20 0850    Review of Systems;  Patient denies headache, fevers, malaise, unintentional weight loss, skin rash, eye pain, sinus congestion and sinus pain, sore throat, dysphagia,  hemoptysis , cough, dyspnea, wheezing, chest pain, palpitations, orthopnea, edema, abdominal pain, nausea, melena, diarrhea, constipation, flank pain, dysuria, hematuria, urinary  Frequency, nocturia, numbness, tingling, seizures,  Focal weakness, Loss of consciousness,  Tremor, insomnia, depression, anxiety, and suicidal ideation.      Objective:  BP (!) 118/56 (BP Location: Left Arm, Patient Position: Sitting)   Pulse 86   Temp 97.8 F (36.6 C)   Ht 5' 0.98" (1.549 m)   Wt 142 lb 6.4 oz (64.6 kg)   SpO2 99%   BMI 26.92 kg/m   BP Readings from Last 3 Encounters:  07/26/20 (!) 118/56  07/22/20 (!) 108/57  07/11/20 (!) 104/55    Wt Readings from Last 3 Encounters:  07/26/20 142 lb 6.4 oz (64.6 kg)  07/22/20 139 lb 12.4 oz (63.4 kg)  07/11/20 139 lb 14.4 oz (63.5 kg)    General appearance: alert, cooperative and appears stated age Ears: normal TM's and external ear canals both ears Throat:  lips, mucosa, and tongue normal; teeth and gums normal Neck: no adenopathy, no carotid bruit, supple, symmetrical, trachea midline and thyroid not enlarged, symmetric, no tenderness/mass/nodules Back: symmetric, no curvature. ROM normal. No CVA tenderness. Lungs: clear to auscultation bilaterally Heart: regular rate and rhythm, S1, S2 normal, no murmur, click, rub or gallop Abdomen: soft, non-tender; bowel sounds normal; no masses,  no organomegaly Pulses: 2+ and symmetric Skin: Skin color, texture, turgor normal. No rashes or lesions Lymph nodes: Cervical, supraclavicular, and axillary nodes normal.  Lab Results  Component Value Date   HGBA1C 6.1 (H) 12/30/2019   HGBA1C 6.0 07/27/2019   HGBA1C 14.1 08/08/2018    Lab Results  Component Value Date   CREATININE 1.23 (H) 07/22/2020   CREATININE 1.33 (  H) 07/21/2020   CREATININE 1.69 (H) 07/19/2020    Lab Results  Component Value Date   WBC 13.6 (H) 07/22/2020   HGB 7.6 (L) 07/22/2020   HCT 24.9 (L) 07/22/2020   PLT 236 07/22/2020   GLUCOSE 81 07/22/2020   CHOL 114 12/30/2019   TRIG 131 12/30/2019   HDL 47 12/30/2019   LDLCALC 41 12/30/2019   ALT 11 06/10/2020   AST 20 06/10/2020   NA 139 07/22/2020   K 3.9 07/22/2020   CL 106 07/22/2020   CREATININE 1.23 (H) 07/22/2020   BUN 11 07/22/2020   CO2 24 07/22/2020   TSH 2.091 07/19/2020   INR 1.4 (H) 07/19/2020   HGBA1C 6.1 (H) 12/30/2019   MICROALBUR 2.6 (H) 10/04/2017    ECHOCARDIOGRAM COMPLETE  Result Date: 07/22/2020    ECHOCARDIOGRAM REPORT   Patient Name:   LUCENDIA LEARD Date of Exam: 07/22/2020 Medical Rec #:  [id number]         Height:       61.0 in Accession #:    [id number]        Weight:       139.8 lb Date of Birth:  05-13-1951         BSA:          1.622 m Patient Age:    39 years          BP:           91/45 mmHg Patient Gender: F                 HR:           72 bpm. Exam Location:  [facility] Procedure: 2D Echo, Cardiac Doppler and Color Doppler Indications:      NSTEMI I21.4  History:         Patient has prior history of Echocardiogram examinations, most                  recent 05/20/2020. Signs/Symptoms:Murmur; Risk                  Factors:Hypertension. Cancer patient.  Sonographer:     Sherrie Sport RDCS (AE) Referring Phys:  [id number] Athena Masse Diagnosing Phys: [name] MD  Sonographer Comments: No apical window. IMPRESSIONS  1. Left ventricular ejection fraction, by estimation, is <20%. The left ventricle has severely decreased function. The left ventricle demonstrates global hypokinesis. The left ventricular internal cavity size was mildly dilated. Left ventricular diastolic function could not be evaluated.  2. Right ventricular systolic function is normal. The right ventricular size is normal. Tricuspid regurgitation signal is inadequate for assessing PA pressure.  3. Left atrial size was mildly dilated.  4. The mitral valve is normal in structure. No evidence of mitral valve regurgitation. No evidence of mitral stenosis.  5. The aortic valve is normal in structure. Aortic valve regurgitation is not visualized. No aortic stenosis is present. FINDINGS  Left Ventricle: Left ventricular ejection fraction, by estimation, is <20%. The left ventricle has severely decreased function. The left ventricle demonstrates global hypokinesis. The left ventricular internal cavity size was mildly dilated. There is no  left ventricular hypertrophy. Left ventricular diastolic function could not be evaluated. Right Ventricle: The right ventricular size is normal. No increase in right ventricular wall thickness. Right ventricular systolic function is normal. Tricuspid regurgitation signal is inadequate for assessing PA pressure. Left Atrium: Left atrial size was mildly dilated. Right Atrium: Right atrial size was normal in  size. Pericardium: There is no evidence of pericardial effusion. Mitral Valve: The mitral valve is normal in structure. No evidence of mitral valve regurgitation.  No evidence of mitral valve stenosis. Tricuspid Valve: The tricuspid valve is normal in structure. Tricuspid valve regurgitation is not demonstrated. No evidence of tricuspid stenosis. Aortic Valve: The aortic valve is normal in structure. Aortic valve regurgitation is not visualized. No aortic stenosis is present. Pulmonic Valve: The pulmonic valve was normal in structure. Pulmonic valve regurgitation is not visualized. No evidence of pulmonic stenosis. Aorta: The aortic root is normal in size and structure. Venous: The inferior vena cava was not well visualized. IAS/Shunts: No atrial level shunt detected by color flow Doppler.  LEFT VENTRICLE PLAX 2D LVIDd:         5.22 cm LVIDs:         4.66 cm LV PW:         1.20 cm LV IVS:        1.01 cm LVOT diam:     2.00 cm LVOT Area:     3.14 cm  LEFT ATRIUM         Index LA diam:    3.30 cm 2.03 cm/m                        PULMONIC VALVE AORTA                 PV Vmax:        0.81 m/s Ao Root diam: 2.70 cm PV Peak grad:   2.6 mmHg                       RVOT Peak grad: 3 mmHg   SHUNTS Systemic Diam: 2.00 cm [name] MD Electronically signed by [name] MD Signature Date/Time: 07/22/2020/9:03:27 AM    Final     Assessment & Plan:   Problem List Items Addressed This Visit      Unprioritized   Acute on chronic congestive heart failure ([place]) - Primary   Relevant Orders   AMB Referral to [place]   Ambulatory referral to Hospice   Cardiomyopathy [facility])    She now has exertional angina presenting as nausea due to severe cardiomyopthy with EF of 20%.  Advised to conserve her energy and not waste it on housework,  Grocery shopping, etc.  Hospice referral discussed. She is not sure she is ready to forego admission to hospital for future events but understands that her prognosis is less than 6 months Hospice referral made.       Chemotherapy-induced peripheral neuropathy ([place])   Relevant Orders   AMB Referral to [facility]    Endometrial cancer [facility])   Relevant Orders   AMB Referral to [place]   Ambulatory referral to [facility] discharge follow-up    Patient is stable post discharge and has no new issues or questions about discharge plans at the visit today for hospital follow up. All labs , imaging studies and progress notes from admission were reviewed with patient today        NSTEMI (non-ST elevated myocardial infarction) ([place])    Secondary to ischemic demand from profound anemia       Stage 3b chronic kidney disease ([place])   Relevant Orders   AMB Referral to [place]    Other Visit Diagnoses    B12 deficiency  Relevant Medications   cyanocobalamin ((VITAMIN B-12)) injection 1,000 mcg (Completed)      I have changed Phylis Bougie. Franchino's cyanocobalamin. I am also having her maintain her cholecalciferol, LORazepam, morphine CONCENTRATE, amitriptyline, apixaban, HYDROcodone-homatropine, levothyroxine, ivabradine, empagliflozin, amiodarone, furosemide, atorvastatin, potassium chloride, and pantoprazole. We administered cyanocobalamin.  Meds ordered this encounter  Medications  . cyanocobalamin (,VITAMIN B-12,) 1000 MCG/ML injection    Sig: Inject 1 mL (1,000 mcg total) into the muscle every 14 (fourteen) days.    Dispense:  2 mL    Refill:  11  . pantoprazole (PROTONIX) 20 MG tablet    Sig: Take 1 tablet (20 mg total) by mouth daily.    Dispense:  90 tablet    Refill:  1  . cyanocobalamin ((VITAMIN B-12)) injection 1,000 mcg    Medications Discontinued During This Encounter  Medication Reason  . cyanocobalamin (,VITAMIN B-12,) 1000 MCG/ML injection   . pantoprazole (PROTONIX) 20 MG tablet Reorder    Follow-up: No follow-ups on file.   [name], MD

## 2020-07-27 NOTE — Progress Notes (Signed)
Cardiology Office Note    Date:  07/31/2020   ID:  Jaime Hall, DOB 1950-07-07, MRN [id number]  PCP:  [name], MD  Cardiologist:  [name], MD  Electrophysiologist:  None   Chief Complaint: Follow up  History of Present Illness:   Jaime Hall is a 70 y.o. female with history of CAD status post PCI/DES to the proximal LAD with PTCA to the diagonal in 02/2012, HFrEF likely secondary to chemotherapy with prior treatment with Adriamycin, SVT, CKD stage III, Hodgkin's lymphoma treated with chemoradiation to the chest in remission since 2011, uterine cancer diagnosed in 2018 status post surgery and chemotherapy, high-grade endometrial cancer diagnosed in 2021 status post chemotherapy in 10/2019, PE diagnosed in 02/2020 treated with Eliquis complicated by hemoptysis, normocytic anemia in the context of ongoing vaginal bleeding, prior tobacco use, and HLD who presents for hospital follow-up as outlined below.  Ms. Driver last underwent ischemic testing in 02/2013 via treadmill nuclear stress test that showed a fixed anterior wall defect with minor reversibility and a normal EF. She was diagnosed with stage IB high grade endometrial cancer in the spring of 2021. She was seen in the office in 10/2019 for risk stratification of chemotherapy. Echo on 11/02/2019, showed an EF of 30-35%, Gr1DD, normal RVSF and ventricular cavity size, mild MR/AI, and a mildly elevated PASP. 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  Subsequent stress test was high risk.  In this setting she underwent diagnostic R/LHC on 12/04/2019 which showed nonobstructive disease including a patent previously placed LAD stent, ostial to proximal LAD 40% stenosis, LVEF 30 to 35%, and a moderately elevated LVEDP.  She was [facility] to [facility] in 01/2020 with volume overload.  Echo showed an EF of 25 to 30%, global hypokinesis, normal RV systolic function and ventricular cavity  size, moderate mitral regurgitation, and mild aortic insufficiency.  CTA of the chest was negative for PE.  She underwent IV diuresis with symptomatic improvement.  She was admitted in 02/2020 with worsening shortness of breath with CTA of the chest showing an acute segmental PE in the right lower lobe treated with IV heparin and Eliquis.  She was readmitted a second time in 02/2020 with acute respiratory distress secondary to acute on chronic HFrEF and hemoptysis.  Eliquis was discontinued.Eliquis was then resumed at a lower dose of 2.5 mg twice daily.  She was seen in the ED on 11/25 with presumed SVT treated with adenosine.  She was admitted in 05/2020 with volume overload which she attributed to the resumption of Lenimva.  Echo showed an EF less than 20%, mildly dilated LV cavity size, normal RV systolic function and ventricular cavity size, mild mitral regurgitation, and a mildly dilated left atrium.  She was diuresed and started on ivabradine with symptomatic improvement in the hospital though ivabradine was denied by her insurance and she was subsequently transitioned to Toprol.  She subsequently noted intolerance to Toprol and was noted to be significantly tachycardic into the 120s.  In this setting it was again recommended the patient be started on ivabradine when she was seen in hospital follow-up in 06/2020.  She was admitted to the hospital most recently from 2/4 through 2/7 with SVT and presyncope.  Her SVT was successfully treated with adenosine.  High-sensitivity troponin peaked at 587 and was felt to be supply demand ischemia.  Head CT was nonacute.  Repeat echo was obtained and again showed an EF of less than  20%, mildly dilated LV cavity size, normal RV systolic function and ventricular cavity size, mildly dilated left atrium, and no significant valvular abnormalities.  Outpatient Zio patch was recommended.  With regards to her anemia she was transfused 1 unit of packed red blood cell and given  an iron infusion.  She was seen by PCP for hospital follow-up on 07/26/2020 with no indicating she was previously told several months ago by oncology that she had a 73-month prognosis without continued treatment.  Patient preference was no further treatment at that time.  It was noted that she was back on metoprolol as the cost of ivabradine was $100.  She has been referred to hospice care and community care coordination.  She comes in accompanied by her sister-in-law today.  She is doing well from a cardiac perspective without symptoms concerning for angina.  She does continue to note generalized fatigue and malaise, particularly with activity.  She is working on delegating some of her household chores and errand running.  She is tolerating amiodarone without issues.  Appetite is low.   Labs independently reviewed: 07/2020 - magnesium 1.8, potassium 3.9, BUN 11, SCr 1.23, HGB 7.6, PLT 236, TSH normal 05/2020 - albumin 3.2, AST/ALT normal 12/2019 - A1c 6.1, TC 114, TG 131, HDL 47, LDL 41  Past Medical History:  Diagnosis Date  . Anxiety   . Cervicalgia   . Chronic kidney disease   . Coronary artery disease    a. 02/2012 Stress echo: severe anterior wall ischemia;  b. 02/2012 Cath/PCI: LAD 95p (3.0 x 15 Xience EX DES), D1 90ost (PTCA - bifurcational dzs), EF 45% with anterior HK;  c. 02/2013 Ex MV: fixed anterior defect w/ minor reversibility, nl EF-->Med Rx; d. 11/2019 MV: EF 41%, ant/antlat ischemia-->high risk scan; e. 11/2019 Cath: LM nl, LAD 40ost/p, patent LAD stent, LCX nl, RCA nl, RPDA/RPAV nl, EF 30  . Endometrial cancer ([place])    a. 07/2016 s/p robotic hysterectomy, BSO w/ washings, sentinel node inj, mapping, bx, adhesiolysis.  . Essential hypertension, benign   . Fibrocystic breast disease   . GERD (gastroesophageal reflux disease)   . Gestational hypertension   . Heart murmur   . HFrEF (heart failure with reduced ejection fraction) ([place])    a. 05/2020 Echo: EF <20%, glob HK.  Marland Kitchen History  of anemia   . History of blood transfusion   . Hodgkin's lymphoma ([place]) 2011   a. s/p radiation and chemo therapy  . Mixed Ischemic and Nonischemic Cardiomyopathy    a. 05/2020 Echo: EF <20%, glob HK, Nl RV size/fxn, mildly dil LA. Mild MR. Mild to mod Ao sclerosis.  . Osteoarthritis   . Polycystic ovarian disease     Past Surgical History:  Procedure Laterality Date  . ABDOMINAL HYSTERECTOMY    . bladder sling    . CARDIAC CATHETERIZATION  02/2012   [facility] 1 stent place  . CERVICAL POLYPECTOMY    . CHOLECYSTECTOMY  1982  . COLONOSCOPY WITH PROPOFOL N/A 02/05/2015   Procedure: COLONOSCOPY WITH PROPOFOL;  Surgeon: Lucilla Lame, MD;  Location: [facility] ENDOSCOPY;  Service: Endoscopy;  Laterality: N/A;  . CORONARY ANGIOPLASTY  02/2012   left/right s/p balloon  . CYSTOGRAM N/A 08/17/2016   Procedure: CYSTOGRAM;  Surgeon: Hollice Espy, MD;  Location: [facility] ORS;  Service: Urology;  Laterality: N/A;  . CYSTOSCOPY N/A 08/17/2016   Procedure: CYSTOSCOPY EXAM UNDER ANESTHESIA;  Surgeon: Hollice Espy, MD;  Location: [facility] ORS;  Service: Urology;  Laterality: N/A;  . CYSTOSCOPY  W/ RETROGRADES Bilateral 08/17/2016   Procedure: CYSTOSCOPY WITH RETROGRADE PYELOGRAM;  Surgeon: Hollice Espy, MD;  Location: [facility] ORS;  Service: Urology;  Laterality: Bilateral;  . CYSTOSCOPY WITH STENT PLACEMENT Right 08/17/2016   Procedure: CYSTOSCOPY WITH STENT PLACEMENT;  Surgeon: Hollice Espy, MD;  Location: [facility] ORS;  Service: Urology;  Laterality: Right;  . heart stent'  2013  . kidney stent Right 2018  . LYMPH NODE BIOPSY  2011   diagnosis of hodgkins lymphoma  . PELVIC LYMPH NODE DISSECTION N/A 07/29/2016   Procedure: PELVIC/AORTIC LYMPH NODE SAMPLING;  Surgeon: [name], MD;  Location: [facility] ORS;  Service: Gynecology;  Laterality: N/A;  . PORTA CATH INSERTION N/A 09/22/2016   Procedure: Glori Luis Cath Insertion;  Surgeon: [name], MD;  Location: [place] [facility];  Service: Cardiovascular;   Laterality: N/A;  . PORTA CATH INSERTION N/A 10/27/2019   Procedure: PORTA CATH INSERTION;  Surgeon: [name], MD;  Location: [place] [facility];  Service: Cardiovascular;  Laterality: N/A;  . PORTA CATH REMOVAL N/A 11/17/2016   Procedure: Glori Luis Cath Removal;  Surgeon: [name], MD;  Location: [place] [facility];  Service: Cardiovascular;  Laterality: N/A;  . RIGHT/LEFT HEART CATH AND CORONARY ANGIOGRAPHY N/A 12/04/2019   Procedure: RIGHT/LEFT HEART CATH AND CORONARY ANGIOGRAPHY;  Surgeon: Wellington Hampshire, MD;  Location: [place] [facility];  Service: Cardiovascular;  Laterality: N/A;  . ROBOTIC ASSISTED TOTAL HYSTERECTOMY WITH BILATERAL SALPINGO OOPHERECTOMY N/A 07/29/2016   Procedure: ROBOTIC ASSISTED TOTAL HYSTERECTOMY WITH BILATERAL SALPINGO OOPHORECTOMY;  Surgeon: [name], MD;  Location: [facility] OR[facility];  Service: Gynecology;  Laterality: N/A;  . SENTINEL NODE BIOPSY N/A 07/29/2016   Procedure: SENTINEL NODE BIOPSY;  Surgeon: [name], MD;  Location: [facility];  Service: Gynecology;  Laterality: N/A;  . transobturator sling N/A 2009   [place]    Current Medications: Current Meds  Medication Sig  . amitriptyline (ELAVIL) 25 MG tablet Take 1 tablet (25 mg total) by mouth at bedtime. May increase weekly as needed  . apixaban (ELIQUIS) 2.5 MG TABS tablet Take 1 tablet (2.5 mg total) by mouth 2 (two) times daily.  Marland Kitchen atorvastatin (LIPITOR) 10 MG tablet Take 1 tablet (10 mg total) by mouth daily.  . cholecalciferol (VITAMIN D3) 25 MCG (1000 UNIT) tablet Take 1,000 Units by mouth in the morning and at bedtime.  . cyanocobalamin (,VITAMIN B-12,) 1000 MCG/ML injection Inject 1 mL (1,000 mcg total) into the muscle every 14 (fourteen) days.  . empagliflozin (JARDIANCE) 10 MG TABS tablet Take 1 tablet (10 mg total) by mouth daily.  . furosemide (LASIX) 20 MG tablet Take 1 tablet (20 mg total) by mouth as needed for fluid or edema.  Marland Kitchen  HYDROcodone-homatropine (HYCODAN) 5-1.5 MG/5ML syrup Take 5 mLs by mouth every 6 (six) hours as needed for cough.  . ivabradine (CORLANOR) 5 MG TABS tablet Take 1 tablet (5 mg total) by mouth 2 (two) times daily with a meal.  . levothyroxine (SYNTHROID) 75 MCG tablet Take 1 tablet (75 mcg total) by mouth daily before breakfast.  . LORazepam (ATIVAN) 0.5 MG tablet Take 1 tablet (0.5 mg total) by mouth every 6 (six) hours as needed for anxiety (and to help with breathing).  . Morphine Sulfate (MORPHINE CONCENTRATE) 10 mg / 0.5 ml concentrated solution Take 0.25 mLs (5 mg total) by mouth every 6 (six) hours as needed for severe pain or shortness of breath.  . pantoprazole (PROTONIX) 20 MG tablet Take  1 tablet (20 mg total) by mouth daily.  . potassium chloride (KLOR-CON) 10 MEQ tablet Take 1 tablet (10 mEq total) by mouth daily as needed (Take on days that you take the Lasix.).  . [DISCONTINUED] amiodarone (PACERONE) 200 MG tablet Take 1 tablet (200 mg total) by mouth 2 (two) times daily.    Allergies:   Metoprolol, Adhesive [tape], Antifungal [miconazole nitrate], Sulfa antibiotics, and Z-pak [azithromycin]   Social History   Socioeconomic History  . Marital status: Widowed    Spouse name: Not on file  . Number of children: Not on file  . Years of education: Not on file  . Highest education level: Not on file  Occupational History  . Not on file  Tobacco Use  . Smoking status: Former Smoker    Packs/day: 1.00    Years: 30.00    Pack years: 30.00    Types: Cigarettes    Quit date: 07/24/2002    Years since quitting: 18.0  . Smokeless tobacco: Never Used  . Tobacco comment: quit smoking in 2000  Vaping Use  . Vaping Use: Never used  Substance and Sexual Activity  . Alcohol use: Not Currently  . Drug use: No  . Sexual activity: Never  Other Topics Concern  . 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     Social Determinants of Health   Financial Resource Strain: Low Risk   . Difficulty of Paying Living Expenses: Not hard at all  Food Insecurity: No Food Insecurity  . Worried About Charity fundraiser in the Last Year: Never true  . Ran Out of Food in the Last Year: Never true  Transportation Needs: No Transportation Needs  . Lack of Transportation (Medical): No  . Lack of Transportation (Non-Medical): No  Physical Activity: Unknown  . Days of Exercise per Week: 0 days  . Minutes of Exercise per Session: Not on file  Stress: No Stress Concern Present  . Feeling of Stress : Only a little  Social Connections: Unknown  . Frequency of Communication with Friends and Family: More than three times a week  . Frequency of Social Gatherings with Friends and Family: More than three times a week  . Attends Religious Services: Not on file  . Active Member of Clubs or Organizations: Not on file  . Attends Archivist Meetings: Not on file  . Marital Status: Not on file     Family History:  The patient's family history includes ALS in her father; Breast cancer in her maternal aunt; Cancer in her maternal aunt and maternal grandfather; Colon cancer in her maternal aunt; Dementia in her mother; Diabetes in her brother; Non-Hodgkin's lymphoma in her cousin; Polymyositis in her father; Stroke in her maternal grandfather and maternal grandmother.  ROS:   Review of Systems  Constitutional: Positive for malaise/fatigue. Negative for chills, diaphoresis, fever and weight loss.  HENT: Negative for congestion.   Eyes: Negative for discharge and redness.  Respiratory: Negative for cough, sputum production, shortness of breath and wheezing.   Cardiovascular: Negative for chest pain, palpitations, orthopnea, claudication, leg swelling and PND.  Gastrointestinal: Positive for nausea. Negative for abdominal pain, heartburn and vomiting.  Musculoskeletal: Negative for falls and myalgias.  Skin:  Negative for rash.  Neurological: Positive for weakness. Negative for dizziness, tingling, tremors, sensory change, speech change, focal weakness and loss of consciousness.  Endo/Heme/Allergies: Does  not bruise/bleed easily.  Psychiatric/Behavioral: Negative for substance abuse. The patient is not nervous/anxious.   All other systems reviewed and are negative.   EKGs/Labs/Other Studies Reviewed:    Studies reviewed were summarized above. The additional studies were reviewed today:  2D echo 07/22/2020: 1. Left ventricular ejection fraction, by estimation, is <20%. The left  ventricle has severely decreased function. The left ventricle demonstrates  global hypokinesis. The left ventricular internal cavity size was mildly  dilated. Left ventricular  diastolic function could not be evaluated.  2. Right ventricular systolic function is normal. The right ventricular  size is normal. Tricuspid regurgitation signal is inadequate for assessing  PA pressure.  3. Left atrial size was mildly dilated.  4. The mitral valve is normal in structure. No evidence of mitral valve  regurgitation. No evidence of mitral stenosis.  5. The aortic valve is normal in structure. Aortic valve regurgitation is  not visualized. No aortic stenosis is present.  __________  2D echo 05/2020: 1. Left ventricular ejection fraction, by estimation, is <20%. The left  ventricle has severely decreased function. The left ventricle demonstrates  global hypokinesis. The left ventricular internal cavity size was mildly  dilated. Left ventricular  diastolic parameters were normal.  2. Right ventricular systolic function is normal. The right ventricular  size is normal.  3. Left atrial size was mildly dilated.  4. The mitral valve was not well visualized. Mild mitral valve  regurgitation.  5. The aortic valve was not well visualized. Aortic valve regurgitation  is mild to moderate. Mild to moderate aortic valve  sclerosis/calcification  is present, without any evidence of aortic stenosis __________  2D echo 12/2019: 1. Left ventricular ejection fraction, by estimation, is 30 to 35%. The  left ventricle has moderately decreased function. The left ventricle  demonstrates global hypokinesis. Left ventricular diastolic parameters are  consistent with Grade I diastolic  dysfunction (impaired relaxation).  2. Right ventricular systolic function is normal. The right ventricular  size is normal. There is mildly elevated pulmonary artery systolic  pressure. The estimated right ventricular systolic pressure is 16.1 mmHg.  3. The mitral valve is normal in structure. Mild mitral valve  regurgitation.  4. The aortic valve is normal in structure. Aortic valve regurgitation is  mild.  __________  [facility] 11/2019:  There is moderate to severe left ventricular systolic dysfunction.  LV end diastolic pressure is moderately elevated.  Previously placed Prox LAD stent (unknown type) is widely patent.  Ost LAD to Prox LAD lesion is 40% stenosed.   1.  Patent LAD stent with no significant restenosis.  There is moderate stenosis in ostial LAD before the stent.  No evidence of obstructive disease overall. 2.  Moderately to severely reduced LV systolic function with an EF of 30 to 35%. 3.  Right heart catheterization showed mildly elevated filling pressures with pulmonary capillary wedge pressure of 18 mmHg, mild to moderate pulmonary hypertension at 44/19 with a mean of 33 mmHg, and mildly reduced cardiac output at 3.89 with a cardiac index of 2.24.  Pulmonary vascular resistance is 3.8 Woods units  Recommendations: Continue medical therapy for coronary artery disease. 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  She is mildly volume overloaded and I added small dose furosemide. Recommend medical therapy for cardiomyopathy. __________  [facility]  11/2019: Pharmacological myocardial perfusion imaging study with large region of ischemia in the anterior and anterolateral region Septal wall hypokinesis, EF estimated at 41% (depressed ejection fraction possibly secondary to  significant GI uptake artifact) No EKG changes concerning for ischemia at peak stress or in recovery. CT attenuation correction image with mild aortic atherosclerosis, mild coronary calcification High risk scan __________  2D echo 10/2019: 1. Left ventricular ejection fraction, by estimation, is 30 to 35%. The  left ventricle has moderately decreased function. The left ventricle  demonstrates global hypokinesis. Left ventricular diastolic parameters are  consistent with Grade I diastolic  dysfunction (impaired relaxation).  2. Right ventricular systolic function is normal. The right ventricular  size is normal. There is mildly elevated pulmonary artery systolic  pressure. The estimated right ventricular systolic pressure is 53.9 mmHg.  3. Mild to moderate mitral valve regurgitation.  4. Aortic valve regurgitation is mild to moderate.   EKG:  EKG is ordered today.  The EKG ordered today demonstrates NSR, 71 bpm, LBBB (known)  Recent Labs: 05/23/2020: B Natriuretic Peptide 500.2 06/10/2020: ALT 11 07/19/2020: TSH 2.091 07/22/2020: BUN 11; Creatinine, Ser 1.23; Hemoglobin 7.6; Magnesium 1.8; Platelets 236; Potassium 3.9; Sodium 139  Recent Lipid Panel    Component Value Date/Time   CHOL 114 12/30/2019 0457   CHOL 110 04/11/2012 0835   TRIG 131 12/30/2019 0457   HDL 47 12/30/2019 0457   HDL 56 04/11/2012 0835   CHOLHDL 2.4 12/30/2019 0457   VLDL 26 12/30/2019 0457   LDLCALC 41 12/30/2019 0457   LDLCALC 41 04/11/2012 0835    PHYSICAL EXAM:    VS:  BP 114/62   Pulse 71   Ht 5' 0.98" (1.549 m)   Wt 139 lb (63 kg)   BMI 26.28 kg/m   BMI: Body mass index is 26.28 kg/m.  Physical Exam Vitals reviewed.  Constitutional:      Appearance: She is  well-developed and well-nourished.  HENT:     Head: Normocephalic and atraumatic.  Eyes:     General:        Right eye: No discharge.        Left eye: No discharge.  Neck:     Vascular: No JVD.  Cardiovascular:     Rate and Rhythm: Normal rate and regular rhythm.     Pulses: No midsystolic click and no opening snap.          Posterior tibial pulses are 1+ on the right side and 1+ on the left side.     Heart sounds: Normal heart sounds, S1 normal and S2 normal. Heart sounds not distant. No murmur heard. No friction rub.  Pulmonary:     Effort: Pulmonary effort is normal. No respiratory distress.     Breath sounds: Examination of the right-lower field reveals decreased breath sounds. Examination of the left-lower field reveals decreased breath sounds. Decreased breath sounds present. No wheezing or rales.  Chest:     Chest wall: No tenderness.  Abdominal:     General: There is no distension.     Palpations: Abdomen is soft.     Tenderness: There is no abdominal tenderness.  Musculoskeletal:     Cervical back: Normal range of motion.     Left lower leg: Edema present.     Comments: Trace bilateral pretibial edema with the left lower extremity being greater than the right lower extremity which is a longstanding issue dating back to prior lymph node resection for her cancer  Skin:    General: Skin is warm and dry.     Nails: There is no clubbing or cyanosis.  Neurological:     Mental Status: She is alert and  oriented to person, place, and time.  Psychiatric:        Mood and Affect: Mood and affect normal.        Speech: Speech normal.        Behavior: Behavior normal.        Thought Content: Thought content normal.        Judgment: Judgment normal.     Wt Readings from Last 3 Encounters:  07/31/20 139 lb (63 kg)  07/26/20 142 lb 6.4 oz (64.6 kg)  07/22/20 139 lb 12.4 oz (63.4 kg)     ASSESSMENT & PLAN:   1. CAD involving the native coronaries without angina with demand  ischemia: She is doing well without any symptoms concerning for angina.  Recent diagnostic cath in 11/2019 showed a patent LAD stent with no significant restenosis as well as moderate stenosis in the ostial LAD prior to the previously placed stent with no evidence of obstructive disease overall and continued medical therapy.  She remains on low-dose apixaban in place of aspirin given history of PE and in an effort to minimize bleeding risk.  Continue atorvastatin.  Given recent diagnostic cath less than 1 year ago as outlined above and in the context of no angina there are no plans to pursue further ischemic testing at this time.  2. HFrEF secondary to likely mixed ICM and NICM: She appears euvolemic and well compensated with NYHA class IV symptoms.  Her cardiomyopathy has been felt to be predominantly nonischemic and most likely chemotherapy induced.  GDMT has been limited by relative hypotension in the context of low output heart failure therefore she has been unable to be on a beta-blocker, ACE inhibitor/ARB/ARNI/MRA.  Continue empagliflozin and ivabradine.  Given previously documented life expectancy of approximately 6 months for her underlying malignancies and comorbid conditions there is no indication for referral to the advanced heart failure service or EP for advanced therapies.  This was discussed with her in detail, she understands and accepts this.  CHF education.  3. SVT: No further symptoms of tachypalpitations.  Ivabradine for her underlying cardiomyopathy did not prevent recent hospital admission for SVT.  Therefore she was placed on amiodarone in an effort to minimize SVT burden for improved quality of life and minimize potential ER visits/hospitalization.  Decrease amiodarone to 200 mg daily.  Recent thyroid and liver function normal.  Given life expectancy of less than 6 months with her underlying malignancies baseline PFTs/eye exam are deferred at this time.  4. Multiple malignancies:  Followed by oncology.  Notes indicate prognosis of less than 6 months.  She has been referred to hospice and community care coordination.  5. Normocytic anemia: Possibly in the setting of ongoing vaginal bleeding with some dilutional component during her recent admission.  Status post PRBC transfusion and iron infusion.  Followed by hematology/oncology.  6. Presyncope/syncope: Previously noted to have presyncope while on metoprolol.  Work-up during recent admission was largely unrevealing outside of PSVT which was noted upon admission.  We did have a discussion today regarding potential increased risk of ventricular arrhythmias in the setting of her significant cardiomyopathy and discussed the option of outpatient cardiac monitoring.  Ultimately, this was deferred given her comorbid conditions/life expectancy with her unlikely to be a candidate for advanced therapies if indicated.  She agrees with this decision.  7. CKD stage III: Renal function improved on most recent check.  8. 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  9. End-of-life care: Being followed  by hospice.  Current goals are to focus on quality of life.  Disposition: F/u with Dr. [name] or an APP in 3 months.   Medication Adjustments/Labs and Tests Ordered: Current medicines are reviewed at length with the patient today.  Concerns regarding medicines are outlined above. Medication changes, Labs and Tests ordered today are summarized above and listed in the Patient Instructions accessible in Encounters.   Signed, Christell Faith, PA-C 07/31/2020 3:46 PM     [address], [place] [address] [phone]

## 2020-07-28 NOTE — Assessment & Plan Note (Signed)
Secondary to ischemic demand from profound anemia

## 2020-07-28 NOTE — Assessment & Plan Note (Signed)
She now has exertional angina presenting as nausea due to severe cardiomyopthy with EF of 20%.  Advised to conserve her energy and not waste it on housework,  Grocery shopping, etc.  Hospice referral discussed. She is not sure she is ready to forego admission to hospital for future events but understands that her prognosis is less than 6 months Hospice referral made.

## 2020-07-28 NOTE — Assessment & Plan Note (Signed)
Patient is stable post discharge and has no new issues or questions about discharge plans at the visit today for hospital follow up. All labs , imaging studies and progress notes from admission were reviewed with patient today   

## 2020-07-30 DIAGNOSIS — I509 Heart failure, unspecified: Secondary | ICD-10-CM | POA: Diagnosis not present

## 2020-07-30 DIAGNOSIS — J9601 Acute respiratory failure with hypoxia: Secondary | ICD-10-CM | POA: Diagnosis not present

## 2020-07-31 ENCOUNTER — Telehealth: Payer: Self-pay

## 2020-07-31 ENCOUNTER — Ambulatory Visit (INDEPENDENT_AMBULATORY_CARE_PROVIDER_SITE_OTHER): Payer: PPO | Admitting: Physician Assistant

## 2020-07-31 ENCOUNTER — Encounter: Payer: Self-pay | Admitting: Physician Assistant

## 2020-07-31 ENCOUNTER — Other Ambulatory Visit: Payer: Self-pay

## 2020-07-31 VITALS — BP 114/62 | HR 71 | Ht 60.98 in | Wt 139.0 lb

## 2020-07-31 DIAGNOSIS — R55 Syncope and collapse: Secondary | ICD-10-CM

## 2020-07-31 DIAGNOSIS — I471 Supraventricular tachycardia, unspecified: Secondary | ICD-10-CM

## 2020-07-31 DIAGNOSIS — I428 Other cardiomyopathies: Secondary | ICD-10-CM

## 2020-07-31 DIAGNOSIS — Z515 Encounter for palliative care: Secondary | ICD-10-CM

## 2020-07-31 DIAGNOSIS — N1832 Chronic kidney disease, stage 3b: Secondary | ICD-10-CM

## 2020-07-31 DIAGNOSIS — I2510 Atherosclerotic heart disease of native coronary artery without angina pectoris: Secondary | ICD-10-CM

## 2020-07-31 DIAGNOSIS — I5022 Chronic systolic (congestive) heart failure: Secondary | ICD-10-CM

## 2020-07-31 DIAGNOSIS — Z86711 Personal history of pulmonary embolism: Secondary | ICD-10-CM

## 2020-07-31 DIAGNOSIS — R42 Dizziness and giddiness: Secondary | ICD-10-CM

## 2020-07-31 MED ORDER — AMIODARONE HCL 200 MG PO TABS: 200.0000 mg | ORAL_TABLET | Freq: Every day | ORAL | 1 refills | Status: AC

## 2020-07-31 NOTE — Patient Instructions (Signed)
Medication Instructions:  Your physician has recommended you make the following change in your medication:   DECREASE Amiodarone 200 mg daily.  *If you need a refill on your cardiac medications before your next appointment, please call your pharmacy*   Lab Work: None ordered  If you have labs (blood work) drawn today and your tests are completely normal, you will receive your results only by: Marland Kitchen MyChart Message (if you have MyChart) OR . A paper copy in the mail If you have any lab test that is abnormal or we need to change your treatment, we will call you to review the results.   Testing/Procedures: None ordered   Follow-Up: At [facility], you and your health needs are our priority.  As part of our continuing mission to provide you with exceptional heart care, we have created designated Provider Care Teams.  These Care Teams include your primary Cardiologist (physician) and Advanced Practice Providers (APPs -  Physician Assistants and Nurse Practitioners) who all work together to provide you with the care you need, when you need it.  We recommend signing up for the patient portal called "MyChart".  Sign up information is provided on this After Visit Summary.  MyChart is used to connect with patients for Virtual Visits (Telemedicine).  Patients are able to view lab/test results, encounter notes, upcoming appointments, etc.  Non-urgent messages can be sent to your provider as well.   To learn more about what you can do with MyChart, go to [email or web address].    Your next appointment:   3 month(s)  The format for your next appointment:   In Person  Provider:   You may see [name], MD or one of the following Advanced Practice Providers on your designated Care Team:    Murray Hodgkins, NP  Christell Faith, PA-C  Marrianne Mood, PA-C  Cadence [place], [place]  Laurann Montana, NP    Other Instructions N/A

## 2020-07-31 NOTE — Chronic Care Management (AMB) (Signed)
  Chronic Care Management   Outreach Note  07/31/2020 Name: Jaime Hall MRN: [id number] DOB: Apr 02, 1951  Jaime Hall is a 70 y.o. year old female who is a primary care patient of Derrel Nip, Jaime Everts, MD. I reached out to Jaime Hall by phone today in response to a referral sent by Jaime Hall PCP, Jaime Mc, MD     An unsuccessful telephone outreach was attempted today. The patient was referred to the case management team for assistance with care management and care coordination.   Follow Up Plan: A HIPAA compliant phone message was left for the patient providing contact information and requesting a return call.  The care management team will reach out to the patient again over the next 1 days.  If patient returns call to provider office, please advise to call [facility] at [place], [place], [place], [place] [address] Direct Dial: [phone] [email or web address]: [email or web address]

## 2020-08-01 ENCOUNTER — Telehealth: Payer: Self-pay

## 2020-08-01 NOTE — Chronic Care Management (AMB) (Signed)
  Chronic Care Management   Note  08/01/2020 Name: Jaime Hall MRN: [id number] DOB: 1950/12/08  Jaime Hall is a 70 y.o. year old female who is a primary care patient of Derrel Nip, Aris Everts, MD. I reached out to [name] by phone today in response to a referral sent by Jaime Hall PCP,  Dr Derrel Nip     Ms. Shores was given information about Chronic Care Management services today including:  1. CCM service includes personalized support from designated clinical staff supervised by her physician, including individualized plan of care and coordination with other care providers 2. 24/7 contact phone numbers for assistance for urgent and routine care needs. 3. Service will only be billed when office clinical staff spend 20 minutes or more in a month to coordinate care. 4. Only one practitioner may furnish and bill the service in a calendar month. 5. The patient may stop CCM services at any time (effective at the end of the month) by phone call to the office staff. 6. The patient will be responsible for cost sharing (co-pay) of up to 20% of the service fee (after annual deductible is met).  Patient agreed to services and verbal consent obtained.   Follow up plan: Telephone appointment with care management team member scheduled for:  [facility] 08/05/20 ,Pharm D 08/20/20  [facility] Noreene Lar[address], [address] [place], [place], [place] [address] Direct Dial: [phone] [email or web address] Website: [email or web address]

## 2020-08-05 ENCOUNTER — Ambulatory Visit (INDEPENDENT_AMBULATORY_CARE_PROVIDER_SITE_OTHER): Payer: PPO | Admitting: *Deleted

## 2020-08-05 DIAGNOSIS — I5022 Chronic systolic (congestive) heart failure: Secondary | ICD-10-CM

## 2020-08-05 DIAGNOSIS — I420 Dilated cardiomyopathy: Secondary | ICD-10-CM

## 2020-08-05 DIAGNOSIS — C541 Malignant neoplasm of endometrium: Secondary | ICD-10-CM

## 2020-08-05 DIAGNOSIS — I471 Supraventricular tachycardia: Secondary | ICD-10-CM

## 2020-08-05 DIAGNOSIS — I10 Essential (primary) hypertension: Secondary | ICD-10-CM

## 2020-08-05 NOTE — Chronic Care Management (AMB) (Signed)
Chronic Care Management   CCM RN Visit Note  08/05/2020 Name: Jaime Hall MRN: [id number] DOB: 1951-03-18  Subjective: Jaime Hall is a 70 y.o. year old female who is a primary care patient of Derrel Nip, Aris Everts, MD. The care management team was consulted for assistance with disease management and care coordination needs.    Engaged with patient by telephone for initial visit in response to provider referral for case management and/or care coordination services.   Consent to Services:  The patient was given the following information about Chronic Care Management services today, agreed to services, and gave verbal consent: 1. CCM service includes personalized support from designated clinical staff supervised by the primary care provider, including individualized plan of care and coordination with other care providers 2. 24/7 contact phone numbers for assistance for urgent and routine care needs. 3. Service will only be billed when office clinical staff spend 20 minutes or more in a month to coordinate care. 4. Only one practitioner may furnish and bill the service in a calendar month. 5.The patient may stop CCM services at any time (effective at the end of the month) by phone call to the office staff. 6. The patient will be responsible for cost sharing (co-pay) of up to 20% of the service fee (after annual deductible is met). Patient agreed to services and consent obtained.  Patient agreed to services and verbal consent obtained.   Assessment: Review of patient past medical history, allergies, medications, health status, including review of consultants reports, laboratory and other test data, was performed as part of comprehensive evaluation and provision of chronic care management services.   SDOH (Social Determinants of Health) assessments and interventions performed:  SDOH Interventions   Flowsheet Row Most Recent Value  SDOH Interventions   Financial Strain Interventions  Intervention Not Indicated  Intimate Partner Violence Interventions Intervention Not Indicated  Transportation Interventions Intervention Not Indicated       CCM Care Plan  Allergies  Allergen Reactions  . Metoprolol Other (See Comments)    Fatigue  . Adhesive [Tape] Other (See Comments)    Skin Irritation   . Antifungal [Miconazole Nitrate] Rash  . Sulfa Antibiotics Nausea And Vomiting and Rash  . Z-Pak [Azithromycin] Itching    Outpatient Encounter Medications as of 08/05/2020  Medication Sig Note  . amiodarone (PACERONE) 200 MG tablet Take 1 tablet (200 mg total) by mouth daily.   Marland Kitchen amitriptyline (ELAVIL) 25 MG tablet Take 1 tablet (25 mg total) by mouth at bedtime. May increase weekly as needed   . apixaban (ELIQUIS) 2.5 MG TABS tablet Take 1 tablet (2.5 mg total) by mouth 2 (two) times daily.   Marland Kitchen atorvastatin (LIPITOR) 10 MG tablet Take 1 tablet (10 mg total) by mouth daily.   . cholecalciferol (VITAMIN D3) 25 MCG (1000 UNIT) tablet Take 1,000 Units by mouth in the morning and at bedtime.   . cyanocobalamin (,VITAMIN B-12,) 1000 MCG/ML injection Inject 1 mL (1,000 mcg total) into the muscle every 14 (fourteen) days.   . empagliflozin (JARDIANCE) 10 MG TABS tablet Take 1 tablet (10 mg total) by mouth daily.   . furosemide (LASIX) 20 MG tablet Take 1 tablet (20 mg total) by mouth as needed for fluid or edema.   . ivabradine (CORLANOR) 5 MG TABS tablet Take 1 tablet (5 mg total) by mouth 2 (two) times daily with a meal.   . levothyroxine (SYNTHROID) 75 MCG tablet Take 1 tablet (75 mcg total) by mouth daily before  breakfast.   . LORazepam (ATIVAN) 0.5 MG tablet Take 1 tablet (0.5 mg total) by mouth every 6 (six) hours as needed for anxiety (and to help with breathing).   . Morphine Sulfate (MORPHINE CONCENTRATE) 10 mg / 0.5 ml concentrated solution Take 0.25 mLs (5 mg total) by mouth every 6 (six) hours as needed for severe pain or shortness of breath.   . oxyCODONE (OXY  IR/ROXICODONE) 5 MG immediate release tablet Take 5 mg by mouth every 4 (four) hours as needed for severe pain.   . pantoprazole (PROTONIX) 20 MG tablet Take 1 tablet (20 mg total) by mouth daily.   . potassium chloride (KLOR-CON) 10 MEQ tablet Take 1 tablet (10 mEq total) by mouth daily as needed (Take on days that you take the Lasix.).   . HYDROcodone-homatropine (HYCODAN) 5-1.5 MG/5ML syrup Take 5 mLs by mouth every 6 (six) hours as needed for cough. (Patient not taking: Reported on 08/05/2020) 08/05/2020: Reports no longer taking  . [DISCONTINUED] prochlorperazine (COMPAZINE) 10 MG tablet Take 1 tablet (10 mg total) by mouth every 6 (six) hours as needed (Nausea or vomiting). (Patient taking differently: Take 10 mg by mouth daily as needed for nausea or vomiting. )    Facility-Administered Encounter Medications as of 08/05/2020  Medication  . heparin lock flush 100 unit/mL  . sodium chloride flush (NS) 0.9 % injection 10 mL  . sodium chloride flush (NS) 0.9 % injection 10 mL    Patient Active Problem List   Diagnosis Date Noted  . Chronic systolic CHF (congestive heart failure) ([place]) 07/20/2020  . SVT (supraventricular tachycardia) ([place]) 07/20/2020  . Cardiomyopathy ([place]) 07/20/2020  . Hypotension 07/20/2020  . NSTEMI (non-ST elevated myocardial infarction) ([place]) 07/20/2020  . Acute on chronic combined systolic and diastolic CHF (congestive heart failure) ([place]) 05/19/2020  . PE (pulmonary thromboembolism) ([place]) 05/19/2020  . GERD (gastroesophageal reflux disease) 05/19/2020  . UTI (urinary tract infection) 05/19/2020  . Hospital discharge follow-up 03/02/2020  . Hemoptysis 02/20/2020  . Acute on chronic congestive heart failure ([place])   . Palliative care encounter   . Acute pulmonary embolism ([place]) 02/14/2020  . Thrombocytopenia ([place]) 02/14/2020  . Hypothyroidism 02/14/2020  . Chest pain 12/29/2019  . Leukocytosis 12/29/2019  . HTN (hypertension) 12/29/2019  . HLD (hyperlipidemia)  12/29/2019  . Elevated troponin 12/29/2019  . Stage 3b chronic kidney disease ([address]) 12/29/2019  . Depression with anxiety 12/29/2019  . Abnormal cardiovascular stress test   . Dyspnea on exertion   . Goals of care, counseling/discussion 10/20/2019  . 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 Peritoneal metastases ([place]) 10/16/2019  . Right anterior knee pain 07/25/2019  . Educated about COVID-19 virus infection 11/15/2018  . Cramps, muscle, general 04/06/2018  . B12 deficiency anemia 07/24/2017  . Normocytic anemia 07/18/2017  . Back pain 07/18/2017  . Chemotherapy-induced peripheral neuropathy ([place]) 12/10/2016  . Hyperlipidemia 08/13/2016  . Hx of colonic polyps 08/13/2016  . Benign neoplasm of sigmoid colon 08/13/2016  . Endometrial cancer ([place]) 08/13/2016  . 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 Lymphedema 07/20/2016  . Chronic venous insufficiency 07/20/2016  . PAD (peripheral artery disease) ([place]) 07/20/2016  . Class 1 obesity due to excess calories without serious comorbidity with body mass index (BMI) of 34.0 to 34.9 in adult 05/27/2016  . Leg cramps 05/27/2016  . Bronchiectasis ([place]) 12/19/2015  . Pre-syncope 12/19/2015  . Prolapsed, uterovaginal, incomplete 06/24/2014  . Routine general medical examination at a health care facility 12/30/2012  .  Obesity (BMI 30-39.9) 12/30/2012  . Hx of multiple pulmonary nodules 12/28/2012  . Plantar fasciitis of right foot 12/28/2012  . Hip pain, bilateral 09/12/2012  . History of Hodgkin's lymphoma 09/12/2012  . Coronary artery disease   . Chest pain on exertion 02/29/2012    Conditions to be addressed/monitored:CHF, HTN, SVT, and Endometrial Cancer  Care Plan : Endometrial Cancer  Updates made by Leona Singleton, RN since 08/05/2020 12:00 AM  Problem: Restablishing Palliative Care   Priority: Medium  Long-Range Goal: Palliative Care Plan Developed   Start Date: 08/05/2020  Expected End  Date: 12/12/2020  Priority: Medium  Current Barriers:   Ineffective Self Health Maintenance due to weakness related to decrease in cardiac output and periods of SVT related to cancer treatments.  Patient stating recurrence of endometrial cancer and at this point she no longer wants to go through treatments.  Continues to be in good spirits and has family support to help with ADLs, IADLs when needed.  Does report she has been notified by Hospice she is not a candidate due to her wanting treatment for episodes of SVT if experiencing more exacerbations.  Is still interested in Palliative Care.  Reports she has had a decrease in appetite with some stomach pains and nausea at times  Currently UNABLE TO independently self manage needs related to chronic health conditions due to weak heart and getting short of breath with minimal exertion  Knowledge Deficits related to short term plan for care coordination needs and long term plans for chronic disease management needs Clinical Goal(s):  Marland Kitchen Collaboration with [name], MD regarding development and update of comprehensive plan of care as evidenced by provider attestation and co-signature . Inter-disciplinary care team collaboration (see longitudinal plan of care)  Over the next 90 days, patient will work with care management team to address care coordination and chronic disease management needs related to Disease Management and Care Coordination   Interventions:   Evaluation of current treatment plan related to Cancer and Nutrition self-management and patient's adherence to plan as established by provider.  Collaboration with [name], MD regarding development and update of comprehensive plan of care as evidenced by provider attestation       and co-signature  Inter-disciplinary care team collaboration (see longitudinal plan of care)  Discussed plans with patient for ongoing care management follow up and provided patient with direct contact  information for care management team  Contacted Palliative NP to verify if patient could continue with Palliative Care Services  Discussed nutrition and diet and encouraged patient to try and maintain appetite, eating 3-4 small meals daily and treating abdominal pain as indicated  Encouraged patient to seek assistance from family with ADLs and IADLs  Acknowledged patient in good spirits and encouraged her to continue using alternative measures to lift spirits (prayer, meditation, family support)  Compassionately and openly share information about diagnosis and prognosis; provide opportunity for patient and family to share what is important to them now.  Encouraged patient to discuss with family end of life and her now goal of living to see son's 50th birthday in May  Reviewed medications and importance of medication compliance  Encouraged to work with [employer] to provide hands on community assessment provided by [facility] Patient Goals/Self Care Activities:  Over the next 30 days, patient will:  . Avoid foods that might trigger nausea . Avoid strong odors that might trigger nausea . Change position slowly . Keep track of which medicine helps  and which does not  . Develop a personal pain management plan . Learn relaxation techniques . Use medicine only as doctor says  . Do not let pain get too severe before treating . Monitor for signs of low hemoglobin (weakness, pale skin, tiredness) Follow Up Plan: The care management team will reach out to the patient again over the next 20 business days.    Care Plan : Heart Failure/HTN/SVT  Updates made by Leona Singleton, RN since 08/05/2020 12:00 AM  Problem: Disease Progression (Heart Failure)   Priority: High  Long-Range Goal: Comorbidities Identified and Managed related to heart failure, SVT, and hypertension   Start Date: 08/05/2020  Expected End Date: 12/12/2020  Priority: High  Current Barriers:  Marland Kitchen Knowledge deficit  related to basic heart failure pathophysiology and self care management as evidenced by patient not weighing herself daily.  Patient reports only weighing herself when she see fluid build up in her ankles.  Discussed importance of daily weight monitoring and when to call provider based on weight and when to take prn fluid medication.  Discussed how heart failure and SVT can be related as cause and affect.  Denies any increase in lower extremity edema at this time, stating her left leg is normally with some swelling related to lymphedema.  Denies any increase in shortness of breath at this time but does endorse shortness of breath with minimal activity.  States sister in law is there to help her at this time. Case Manager Clinical Goal(s):  . patient will weigh self daily and record . patient will verbalize understanding of Heart Failure Action Plan and when to call doctor . patient will take all Heart Failure mediations as prescribed . patient will weigh daily and record (notifying MD of 3 lb weight gain over night or 5 lb in a week) Interventions:  . Collaboration with [name], MD regarding development and update of comprehensive plan of care as evidenced by provider attestation and co-signature . Inter-disciplinary care team collaboration (see longitudinal plan of care) . Basic overview and discussion of pathophysiology of Heart Failure reviewed  . Assessed need for readable accurate scales in home . Advised patient to weigh each morning after emptying bladder . Discussed importance of daily weight and advised patient to weigh and record daily . Reviewed role of diuretics in prevention of fluid overload and management of heart failure . Discussed importance of blood pressure monitoring and encouraged patient to check blood pressures at least 3 times a week and to monitor for hypotension as patient sometimes runs with low blood pressures . Encouraged patient to monitor for elevated heart  rates and to apply oxygen if heart rates increase while waiting for medical assistance . Discussed cost of medications and encouraged patient to work with CCM Pharmacist for medication assistance and to continue to work with Cardiologist office for medication assistance Patient Goals/Self-Care Activities . Call office if I gain more than 2 pounds in one day or 5 pounds in one week . Watch for swelling in feet, ankles and legs every day . Weigh myself daily and keep track in log . Check blood pressures at least 3 times a week and monitor for low blood pressures . Fall precautions, make movements slowly to prevent falls . Monitor your heart rate for elevations Follow Up Plan: The care management team will reach out to the patient again over the next 20 business days.      Plan:The care management team will reach out to  the patient again over the next 20 business days.  Hubert Azure RN, MSN RN Care Management Coordinator [place] [phone] [email or web address]

## 2020-08-05 NOTE — Patient Instructions (Signed)
Visit Information  PATIENT GOALS:  Goals Addressed              This Visit's Progress   .  (RNCM) Keep Nausea Under Control and Maintain Appetite        Timeframe:  Long-Range Goal Priority:  Medium Start Date:   08/05/20                          Expected End Date:   12/12/20                    Follow Up Date 08/21/20    . Avoid foods that might trigger nausea . Avoid strong odors that might trigger nausea . Change position slowly . Keep track of which medicine helps and which does not    Why is this important?    During cancer treatment, it may be hard to keep your weight and strength up.   Feeling sick a lot can make life tough.   There are some simple things to try that will help you have fewer bad days.   Eating a variety of healthy foods that are high in calories and protein will help.   Keeping away from foods and smells that might make you feel worse can make a big difference.     Notes:     Marland Kitchen  (RNCM) Keep Pain Under Control        Timeframe:  Long-Range Goal Priority:  Medium Start Date:  08/05/20                           Expected End Date:  12/12/20                     Follow Up Date 08/21/20   . Develop a personal pain management plan . Learn relaxation techniques . Use medicine only as doctor says  . Do not let pain get too severe before treating . Monitor for signs of low hemoglobin (weakness, pale skin, tiredness)   Why is this important?    Day-to-day life can be hard when you have pain.   Even a small change in emotion or a physical problem can make pain better or worse.   Coping with pain depends on how the mind and body reacts to pain.   Pain medicine is just one piece of the treatment puzzle.   There are many tools to help manage pain. A combination of them can be used to best meet your needs.     Notes:     Marland Kitchen  (RNCM) Track and Manage Swelling, Heart Rate and Blood Pressure        Timeframe:  Long-Range Goal Priority:  High Start Date:   08/05/20                           Expected End Date:   12/12/20                    Follow Up Date 08/21/2020    . Call office if I gain more than 2 pounds in one day or 5 pounds in one week . Watch for swelling in feet, ankles and legs every day . Weigh myself daily and keep track in log . Check blood pressures at least 3 times a week and monitor for low blood pressures . Fall  precautions, make movements slowly to prevent falls . Monitor your heart rate for elevations   Why is this important?    It is important to check your weight daily and watch how much salt and liquids you have.   It will help you to manage your heart failure.    Notes:      Consent to CCM Services: Ms. Brabender was given information about Chronic Care Management services today including:  1. CCM service includes personalized support from designated clinical staff supervised by her physician, including individualized plan of care and coordination with other care providers 2. 24/7 contact phone numbers for assistance for urgent and routine care needs. 3. Service will only be billed when office clinical staff spend 20 minutes or more in a month to coordinate care. 4. Only one practitioner may furnish and bill the service in a calendar month. 5. The patient may stop CCM services at any time (effective at the end of the month) by phone call to the office staff. 6. The patient will be responsible for cost sharing (co-pay) of up to 20% of the service fee (after annual deductible is met).  Patient agreed to services and verbal consent obtained.   Patient verbalizes understanding of instructions provided today and agrees to view in [place].   The care management team will reach out to the patient again over the next 20 business  days.   Hubert Azure RN, MSN RN Care Management Coordinator [place] [phone] [email or web address]   CLINICAL CARE PLAN: Patient Care Plan:  Endometrial Cancer  Problem Identified: Restablishing Palliative Care   Priority: Medium  Long-Range Goal: Palliative Care Plan Developed   Start Date: 08/05/2020  Expected End Date: 12/12/2020  Priority: Medium  Current Barriers:   Ineffective Self Health Maintenance due to weakness related to decrease in cardiac output and periods of SVT related to cancer treatments.  Patient stating recurrence of endometrial cancer and at this point she no longer wants to go through treatments.  Continues to be in good spirits and has family support to help with ADLs, IADLs when needed.  Does report she has been notified by Hospice she is not a candidate due to her wanting treatment for episodes of SVT if experiencing more exacerbations.  Is still interested in Palliative Care.  Reports she has had a decrease in appetite with some stomach pains and nausea at times  Currently UNABLE TO independently self manage needs related to chronic health conditions due to weak heart and getting short of breath with minimal exertion  Knowledge Deficits related to short term plan for care coordination needs and long term plans for chronic disease management needs Clinical Goal(s):  Marland Kitchen Collaboration with [name], MD regarding development and update of comprehensive plan of care as evidenced by provider attestation and co-signature . Inter-disciplinary care team collaboration (see longitudinal plan of care)  Over the next 90 days, patient will work with care management team to address care coordination and chronic disease management needs related to Disease Management and Care Coordination   Interventions:   Evaluation of current treatment plan related to Cancer and Nutrition self-management and patient's adherence to plan as established by provider.  Collaboration with [name], MD regarding development and update of comprehensive plan of care as evidenced by provider attestation       and  co-signature  Inter-disciplinary care team collaboration (see longitudinal plan of care)  Discussed plans with patient for ongoing care management follow up and provided  patient with direct contact information for care management team  Contacted Palliative NP to verify if patient could continue with Palliative Care Services  Discussed nutrition and diet and encouraged patient to try and maintain appetite, eating 3-4 small meals daily and treating abdominal pain as indicated  Encouraged patient to seek assistance from family with ADLs and IADLs  Acknowledged patient in good spirits and encouraged her to continue using alternative measures to lift spirits (prayer, meditation, family support)  Compassionately and openly share information about diagnosis and prognosis; provide opportunity for patient and family to share what is important to them now.  Encouraged patient to discuss with family end of life and her now goal of living to see son's 50th birthday in May  Reviewed medications and importance of medication compliance  Encouraged to work with [employer] to provide hands on community assessment provided by [facility] Patient Goals/Self Care Activities:  Over the next 30 days, patient will:  . Avoid foods that might trigger nausea . Avoid strong odors that might trigger nausea . Change position slowly . Keep track of which medicine helps and which does not  . Develop a personal pain management plan . Learn relaxation techniques . Use medicine only as doctor says  . Do not let pain get too severe before treating . Monitor for signs of low hemoglobin (weakness, pale skin, tiredness) Follow Up Plan: The care management team will reach out to the patient again over the next 20 business days.    Patient Care Plan: Heart Failure/HTN/SVT  Problem Identified: Disease Progression (Heart Failure)   Priority: High  Long-Range Goal: Comorbidities Identified and Managed related to  heart failure, SVT, and hypertension   Start Date: 08/05/2020  Expected End Date: 12/12/2020  Priority: High  Current Barriers:  Marland Kitchen Knowledge deficit related to basic heart failure pathophysiology and self care management as evidenced by patient not weighing herself daily.  Patient reports only weighing herself when she see fluid build up in her ankles.  Discussed importance of daily weight monitoring and when to call provider based on weight and when to take prn fluid medication.  Discussed how heart failure and SVT can be related as cause and affect.  Denies any increase in lower extremity edema at this time, stating her left leg is normally with some swelling related to lymphedema.  Denies any increase in shortness of breath at this time but does endorse shortness of breath with minimal activity.  States sister in law is there to help her at this time. Case Manager Clinical Goal(s):  . patient will weigh self daily and record . patient will verbalize understanding of Heart Failure Action Plan and when to call doctor . patient will take all Heart Failure mediations as prescribed . patient will weigh daily and record (notifying MD of 3 lb weight gain over night or 5 lb in a week) Interventions:  . Collaboration with [name], MD regarding development and update of comprehensive plan of care as evidenced by provider attestation and co-signature . Inter-disciplinary care team collaboration (see longitudinal plan of care) . Basic overview and discussion of pathophysiology of Heart Failure reviewed  . Assessed need for readable accurate scales in home . Advised patient to weigh each morning after emptying bladder . Discussed importance of daily weight and advised patient to weigh and record daily . Reviewed role of diuretics in prevention of fluid overload and management of heart failure . Discussed importance of blood pressure monitoring and encouraged patient  to check blood pressures at least 3  times a week and to monitor for hypotension as patient sometimes runs with low blood pressures . Encouraged patient to monitor for elevated heart rates and to apply oxygen if heart rates increase while waiting for medical assistance . Discussed cost of medications and encouraged patient to work with CCM Pharmacist for medication assistance and to continue to work with Cardiologist office for medication assistance Patient Goals/Self-Care Activities . Call office if I gain more than 2 pounds in one day or 5 pounds in one week . Watch for swelling in feet, ankles and legs every day . Weigh myself daily and keep track in log . Check blood pressures at least 3 times a week and monitor for low blood pressures . Fall precautions, make movements slowly to prevent falls . Monitor your heart rate for elevations Follow Up Plan: The care management team will reach out to the patient again over the next 20 business days.

## 2020-08-06 ENCOUNTER — Encounter: Payer: Self-pay | Admitting: Pharmacist

## 2020-08-06 ENCOUNTER — Other Ambulatory Visit: Payer: Self-pay | Admitting: Hospice and Palliative Medicine

## 2020-08-06 DIAGNOSIS — C541 Malignant neoplasm of endometrium: Secondary | ICD-10-CM

## 2020-08-06 NOTE — Telephone Encounter (Signed)
Received fax from [employer] confirming Approval for pt assistance for Corlan[employer]. Pt will receive Corlanor completely free of charge from 08/05/20 to 06/14/21.  Case ID: [id number] I have notified pt and she is appreciative of our help.  Placed approval letter in [employer].

## 2020-08-06 NOTE — Telephone Encounter (Signed)
That is wonderful! Thank you so so much for your help with that. I know it makes a huge difference for Miss Leasure. Loel Dubonnet, NP

## 2020-08-06 NOTE — Telephone Encounter (Signed)
This encounter was created in error - please disregard.

## 2020-08-07 ENCOUNTER — Telehealth: Payer: Self-pay | Admitting: Adult Health Nurse Practitioner

## 2020-08-07 NOTE — Telephone Encounter (Signed)
Spoke with patient regarding the Palliative referral and discussed our services and she was in agreement with scheduling visit with NP.  I have scheduled an in-home Consult for 08/12/20 @ 1 PM

## 2020-08-12 ENCOUNTER — Ambulatory Visit
Admission: EM | Admit: 2020-08-12 | Discharge: 2020-08-12 | Disposition: A | Discharge status: Home / Self Care | Payer: PPO | Attending: Family Medicine | Admitting: Family Medicine

## 2020-08-12 ENCOUNTER — Other Ambulatory Visit: Payer: Self-pay | Admitting: Adult Health Nurse Practitioner

## 2020-08-12 ENCOUNTER — Other Ambulatory Visit: Payer: Self-pay

## 2020-08-12 ENCOUNTER — Inpatient Hospital Stay
Admission: EM | Admit: 2020-08-12 | Discharge: 2020-08-15 | DRG: 871 | Disposition: A | Discharge status: Home / Self Care | Payer: PPO | Attending: Internal Medicine | Admitting: Internal Medicine

## 2020-08-12 ENCOUNTER — Encounter: Payer: Self-pay | Admitting: Adult Health Nurse Practitioner

## 2020-08-12 ENCOUNTER — Emergency Department: Payer: PPO

## 2020-08-12 DIAGNOSIS — D125 Benign neoplasm of sigmoid colon: Secondary | ICD-10-CM | POA: Diagnosis present

## 2020-08-12 DIAGNOSIS — Z882 Allergy status to sulfonamides status: Secondary | ICD-10-CM

## 2020-08-12 DIAGNOSIS — I739 Peripheral vascular disease, unspecified: Secondary | ICD-10-CM | POA: Diagnosis not present

## 2020-08-12 DIAGNOSIS — I959 Hypotension, unspecified: Secondary | ICD-10-CM | POA: Insufficient documentation

## 2020-08-12 DIAGNOSIS — E039 Hypothyroidism, unspecified: Secondary | ICD-10-CM | POA: Diagnosis not present

## 2020-08-12 DIAGNOSIS — R509 Fever, unspecified: Secondary | ICD-10-CM | POA: Insufficient documentation

## 2020-08-12 DIAGNOSIS — M549 Dorsalgia, unspecified: Secondary | ICD-10-CM | POA: Diagnosis present

## 2020-08-12 DIAGNOSIS — Z87891 Personal history of nicotine dependence: Secondary | ICD-10-CM

## 2020-08-12 DIAGNOSIS — N170 Acute kidney failure with tubular necrosis: Secondary | ICD-10-CM | POA: Diagnosis not present

## 2020-08-12 DIAGNOSIS — I429 Cardiomyopathy, unspecified: Secondary | ICD-10-CM

## 2020-08-12 DIAGNOSIS — Z79899 Other long term (current) drug therapy: Secondary | ICD-10-CM

## 2020-08-12 DIAGNOSIS — F418 Other specified anxiety disorders: Secondary | ICD-10-CM | POA: Diagnosis not present

## 2020-08-12 DIAGNOSIS — A419 Sepsis, unspecified organism: Secondary | ICD-10-CM | POA: Diagnosis not present

## 2020-08-12 DIAGNOSIS — Z9221 Personal history of antineoplastic chemotherapy: Secondary | ICD-10-CM | POA: Diagnosis not present

## 2020-08-12 DIAGNOSIS — D519 Vitamin B12 deficiency anemia, unspecified: Secondary | ICD-10-CM | POA: Diagnosis not present

## 2020-08-12 DIAGNOSIS — K219 Gastro-esophageal reflux disease without esophagitis: Secondary | ICD-10-CM | POA: Diagnosis not present

## 2020-08-12 DIAGNOSIS — Z86711 Personal history of pulmonary embolism: Secondary | ICD-10-CM

## 2020-08-12 DIAGNOSIS — Z8571 Personal history of Hodgkin lymphoma: Secondary | ICD-10-CM

## 2020-08-12 DIAGNOSIS — E785 Hyperlipidemia, unspecified: Secondary | ICD-10-CM | POA: Diagnosis not present

## 2020-08-12 DIAGNOSIS — I471 Supraventricular tachycardia: Secondary | ICD-10-CM | POA: Diagnosis not present

## 2020-08-12 DIAGNOSIS — J8410 Pulmonary fibrosis, unspecified: Secondary | ICD-10-CM | POA: Diagnosis not present

## 2020-08-12 DIAGNOSIS — N3000 Acute cystitis without hematuria: Secondary | ICD-10-CM | POA: Diagnosis not present

## 2020-08-12 DIAGNOSIS — T451X5A Adverse effect of antineoplastic and immunosuppressive drugs, initial encounter: Secondary | ICD-10-CM | POA: Diagnosis not present

## 2020-08-12 DIAGNOSIS — Z66 Do not resuscitate: Secondary | ICD-10-CM | POA: Diagnosis not present

## 2020-08-12 DIAGNOSIS — Z79890 Hormone replacement therapy (postmenopausal): Secondary | ICD-10-CM

## 2020-08-12 DIAGNOSIS — Z923 Personal history of irradiation: Secondary | ICD-10-CM

## 2020-08-12 DIAGNOSIS — I420 Dilated cardiomyopathy: Secondary | ICD-10-CM | POA: Diagnosis present

## 2020-08-12 DIAGNOSIS — Z515 Encounter for palliative care: Secondary | ICD-10-CM | POA: Diagnosis not present

## 2020-08-12 DIAGNOSIS — I130 Hypertensive heart and chronic kidney disease with heart failure and stage 1 through stage 4 chronic kidney disease, or unspecified chronic kidney disease: Secondary | ICD-10-CM | POA: Diagnosis present

## 2020-08-12 DIAGNOSIS — I252 Old myocardial infarction: Secondary | ICD-10-CM

## 2020-08-12 DIAGNOSIS — Z881 Allergy status to other antibiotic agents status: Secondary | ICD-10-CM

## 2020-08-12 DIAGNOSIS — I255 Ischemic cardiomyopathy: Secondary | ICD-10-CM | POA: Diagnosis present

## 2020-08-12 DIAGNOSIS — G620 Drug-induced polyneuropathy: Secondary | ICD-10-CM | POA: Diagnosis present

## 2020-08-12 DIAGNOSIS — A4151 Sepsis due to Escherichia coli [E. coli]: Principal | ICD-10-CM | POA: Diagnosis present

## 2020-08-12 DIAGNOSIS — R6520 Severe sepsis without septic shock: Secondary | ICD-10-CM | POA: Diagnosis present

## 2020-08-12 DIAGNOSIS — Z888 Allergy status to other drugs, medicaments and biological substances status: Secondary | ICD-10-CM

## 2020-08-12 DIAGNOSIS — I2699 Other pulmonary embolism without acute cor pulmonale: Secondary | ICD-10-CM | POA: Diagnosis present

## 2020-08-12 DIAGNOSIS — C541 Malignant neoplasm of endometrium: Secondary | ICD-10-CM | POA: Diagnosis present

## 2020-08-12 DIAGNOSIS — R310 Gross hematuria: Secondary | ICD-10-CM | POA: Diagnosis not present

## 2020-08-12 DIAGNOSIS — N390 Urinary tract infection, site not specified: Secondary | ICD-10-CM | POA: Diagnosis present

## 2020-08-12 DIAGNOSIS — Z955 Presence of coronary angioplasty implant and graft: Secondary | ICD-10-CM

## 2020-08-12 DIAGNOSIS — Z803 Family history of malignant neoplasm of breast: Secondary | ICD-10-CM

## 2020-08-12 DIAGNOSIS — I10 Essential (primary) hypertension: Secondary | ICD-10-CM | POA: Diagnosis present

## 2020-08-12 DIAGNOSIS — N1832 Chronic kidney disease, stage 3b: Secondary | ICD-10-CM | POA: Diagnosis present

## 2020-08-12 DIAGNOSIS — I2510 Atherosclerotic heart disease of native coronary artery without angina pectoris: Secondary | ICD-10-CM | POA: Diagnosis present

## 2020-08-12 DIAGNOSIS — I5022 Chronic systolic (congestive) heart failure: Secondary | ICD-10-CM | POA: Diagnosis present

## 2020-08-12 DIAGNOSIS — Z7901 Long term (current) use of anticoagulants: Secondary | ICD-10-CM

## 2020-08-12 DIAGNOSIS — D72829 Elevated white blood cell count, unspecified: Secondary | ICD-10-CM | POA: Diagnosis present

## 2020-08-12 DIAGNOSIS — Z20822 Contact with and (suspected) exposure to covid-19: Secondary | ICD-10-CM | POA: Diagnosis not present

## 2020-08-12 LAB — POCT URINALYSIS DIP (MANUAL ENTRY)
Glucose, UA: 500 mg/dL — AB
Nitrite, UA: POSITIVE — AB
Protein Ur, POC: >=300 mg/dL — AB
Spec Grav, UA: 1.01 (ref 1.010–1.025)
Urobilinogen, UA: 4 E.U./dL — AB
pH, UA: 5 (ref 5.0–8.0)

## 2020-08-12 LAB — BASIC METABOLIC PANEL
Anion gap: 13 (ref 5–15)
BUN: 29 mg/dL — ABNORMAL HIGH (ref 8–23)
CO2: 19 mmol/L — ABNORMAL LOW (ref 22–32)
Calcium: 9.3 mg/dL (ref 8.9–10.3)
Chloride: 100 mmol/L (ref 98–111)
Creatinine, Ser: 2.54 mg/dL — ABNORMAL HIGH (ref 0.44–1.00)
GFR, Estimated: 20 mL/min — ABNORMAL LOW (ref >60)
Glucose, Bld: 130 mg/dL — ABNORMAL HIGH (ref 70–99)
Potassium: 4.1 mmol/L (ref 3.5–5.1)
Sodium: 132 mmol/L — ABNORMAL LOW (ref 135–145)

## 2020-08-12 LAB — URINALYSIS, COMPLETE (UACMP) WITH MICROSCOPIC
RBC / HPF: >50 RBC/hpf — ABNORMAL HIGH (ref 0–5)
Specific Gravity, Urine: 1.022 (ref 1.005–1.030)
WBC, UA: >50 WBC/hpf — ABNORMAL HIGH (ref 0–5)

## 2020-08-12 LAB — CBC
HCT: 36.5 % (ref 36.0–46.0)
Hemoglobin: 11.7 g/dL — ABNORMAL LOW (ref 12.0–15.0)
MCH: 27.9 pg (ref 26.0–34.0)
MCHC: 32.1 g/dL (ref 30.0–36.0)
MCV: 86.9 fL (ref 80.0–100.0)
Platelets: 236 10*3/uL (ref 150–400)
RBC: 4.2 MIL/uL (ref 3.87–5.11)
RDW: 19.8 % — ABNORMAL HIGH (ref 11.5–15.5)
WBC: 17.1 10*3/uL — ABNORMAL HIGH (ref 4.0–10.5)
nRBC: 0 % (ref 0.0–0.2)

## 2020-08-12 LAB — LACTIC ACID, PLASMA: Lactic Acid, Venous: 1.9 mmol/L (ref 0.5–1.9)

## 2020-08-12 LAB — BRAIN NATRIURETIC PEPTIDE: B Natriuretic Peptide: 478.5 pg/mL — ABNORMAL HIGH (ref 0.0–100.0)

## 2020-08-12 LAB — MRSA PCR SCREENING: MRSA by PCR: NEGATIVE

## 2020-08-12 LAB — PROCALCITONIN: Procalcitonin: 1.4 ng/mL

## 2020-08-12 MED ORDER — CEPHALEXIN 500 MG PO CAPS: 500.0000 mg | ORAL_CAPSULE | Freq: Two times a day (BID) | ORAL | 0 refills | Status: DC

## 2020-08-12 MED ORDER — AMIODARONE HCL 200 MG PO TABS
200.0000 mg | ORAL_TABLET | Freq: Every day | ORAL | Status: DC
  Administered 2020-08-12 – 2020-08-15 (×4): 200 mg via ORAL
  Filled 2020-08-12 (×4): qty 1

## 2020-08-12 MED ORDER — ACETAMINOPHEN 650 MG RE SUPP: 325.0000 mg | Freq: Four times a day (QID) | RECTAL | Status: DC | PRN

## 2020-08-12 MED ORDER — LORAZEPAM 1 MG PO TABS: 0.5000 mg | ORAL_TABLET | Freq: Four times a day (QID) | ORAL | Status: DC | PRN

## 2020-08-12 MED ORDER — OXYCODONE HCL 5 MG PO TABS: 5.0000 mg | ORAL_TABLET | ORAL | Status: DC | PRN

## 2020-08-12 MED ORDER — SODIUM CHLORIDE 0.9 % IV SOLN
1.0000 g | INTRAVENOUS | Status: DC
  Administered 2020-08-13 – 2020-08-14 (×2): 1 g via INTRAVENOUS
  Filled 2020-08-12 (×3): qty 10

## 2020-08-12 MED ORDER — AMITRIPTYLINE HCL 25 MG PO TABS
25.0000 mg | ORAL_TABLET | Freq: Every day | ORAL | Status: DC
  Administered 2020-08-12 – 2020-08-14 (×3): 25 mg via ORAL
  Filled 2020-08-12 (×5): qty 1

## 2020-08-12 MED ORDER — ONDANSETRON HCL 4 MG/2ML IJ SOLN: 4.0000 mg | Freq: Four times a day (QID) | INTRAMUSCULAR | Status: DC | PRN

## 2020-08-12 MED ORDER — IVABRADINE HCL 5 MG PO TABS
5.0000 mg | ORAL_TABLET | Freq: Two times a day (BID) | ORAL | Status: DC
  Administered 2020-08-12 – 2020-08-15 (×6): 5 mg via ORAL
  Filled 2020-08-12 (×7): qty 1

## 2020-08-12 MED ORDER — ACETAMINOPHEN 325 MG PO TABS
325.0000 mg | ORAL_TABLET | Freq: Four times a day (QID) | ORAL | Status: DC | PRN
  Administered 2020-08-12: 325 mg via ORAL
  Filled 2020-08-12: qty 1

## 2020-08-12 MED ORDER — PANTOPRAZOLE SODIUM 20 MG PO TBEC
20.0000 mg | DELAYED_RELEASE_TABLET | Freq: Every day | ORAL | Status: DC
Start: 2020-08-13 — End: 2020-08-15
  Administered 2020-08-13 – 2020-08-15 (×3): 20 mg via ORAL
  Filled 2020-08-12 (×3): qty 1

## 2020-08-12 MED ORDER — LEVOTHYROXINE SODIUM 50 MCG PO TABS
75.0000 ug | ORAL_TABLET | Freq: Every day | ORAL | Status: DC
  Administered 2020-08-13 – 2020-08-15 (×3): 75 ug via ORAL
  Filled 2020-08-12 (×3): qty 2

## 2020-08-12 MED ORDER — CHLORHEXIDINE GLUCONATE CLOTH 2 % EX PADS
6.0000 | MEDICATED_PAD | Freq: Every day | CUTANEOUS | Status: DC
  Administered 2020-08-13 – 2020-08-14 (×2): 6 via TOPICAL

## 2020-08-12 MED ORDER — MORPHINE SULFATE (CONCENTRATE) 10 MG/0.5ML PO SOLN: 5.0000 mg | Freq: Four times a day (QID) | ORAL | Status: DC | PRN

## 2020-08-12 MED ORDER — LACTATED RINGERS IV BOLUS
2000.0000 mL | Freq: Once | INTRAVENOUS | Status: AC
  Administered 2020-08-12: 2000 mL via INTRAVENOUS

## 2020-08-12 MED ORDER — MIDODRINE HCL 5 MG PO TABS
5.0000 mg | ORAL_TABLET | Freq: Three times a day (TID) | ORAL | Status: DC
  Administered 2020-08-12 – 2020-08-13 (×2): 5 mg via ORAL
  Filled 2020-08-12 (×3): qty 1

## 2020-08-12 MED ORDER — ONDANSETRON HCL 4 MG/2ML IJ SOLN: 4.0000 mg | Freq: Once | INTRAMUSCULAR | Status: DC

## 2020-08-12 MED ORDER — ATORVASTATIN CALCIUM 10 MG PO TABS
10.0000 mg | ORAL_TABLET | Freq: Every day | ORAL | Status: DC
  Administered 2020-08-12 – 2020-08-14 (×3): 10 mg via ORAL
  Filled 2020-08-12 (×3): qty 1

## 2020-08-12 MED ORDER — ONDANSETRON HCL 4 MG PO TABS: 4.0000 mg | ORAL_TABLET | Freq: Four times a day (QID) | ORAL | Status: DC | PRN

## 2020-08-12 MED ORDER — SODIUM CHLORIDE 0.9 % IV SOLN
1.0000 g | INTRAVENOUS | Status: DC
  Administered 2020-08-12: 1 g via INTRAVENOUS
  Filled 2020-08-12: qty 10

## 2020-08-12 MED ORDER — LACTATED RINGERS IV SOLN: INTRAVENOUS | Status: DC

## 2020-08-12 MED ORDER — APIXABAN 2.5 MG PO TABS
2.5000 mg | ORAL_TABLET | Freq: Two times a day (BID) | ORAL | Status: DC
  Administered 2020-08-12 – 2020-08-15 (×6): 2.5 mg via ORAL
  Filled 2020-08-12 (×6): qty 1

## 2020-08-12 MED ORDER — MIDODRINE HCL 5 MG PO TABS
5.0000 mg | ORAL_TABLET | Freq: Three times a day (TID) | ORAL | Status: DC | PRN
  Filled 2020-08-12: qty 1

## 2020-08-12 NOTE — ED Notes (Signed)
MD at bedside. 

## 2020-08-12 NOTE — Progress Notes (Signed)
Notified bedside nurse of need to draw lactic acid, labs drawn @ 1244 have not resulted.

## 2020-08-12 NOTE — ED Notes (Signed)
Called lab to check on blood that was sent in triage and has not yet resulted. Call back number given and they stated they will call back shortly while they investigate.

## 2020-08-12 NOTE — Discharge Instructions (Signed)
Recommend go to the ER for possible  urinary sepsis.

## 2020-08-12 NOTE — Progress Notes (Signed)
Following for code sepsis 

## 2020-08-12 NOTE — H&P (Signed)
History and Physical   Jaime Hall FXT:[id number] DOB: 1950-10-28 DOA: 08/12/2020  PCP: [name], MD  Outpatient Specialists: Dr. Janese Banks, medical oncology Patient coming from: Home  I have personally briefly reviewed patient's old medical records in [place].  Chief Concern: Right back pain and fever  HPI: Jaime Hall is a 70 y.o. female with medical history significant for heart failure with reduced ejection fraction, EF of less than 20%, echo 07/22/2020, endometrial cancer, status post chemoradiation, now currently on palliative care, CKD 3B, chemotherapy-induced peripheral neuropathy, B12 deficiency, NSTEMI, dilated cardiomyopathy, gastroenteritis, history of pulmonary embolism on Eliquis 2.5 mg twice daily, hypothyroid, anxiety, chronic pain, GERD, presented to the emergency department for chief concerns of fever and right flank pain.  She reports the right back pain started on Saturday, 08/10/2020, she describes the pain as dull and achy, 3/10, and now 0/10 after taking azo.  She endorses fever on Sunday 08/11/2020, t max 101, that resolved with two tylenols.  She denies dysuria and hematuria. She denies cough, chest pain, shortness of breath, abdominal pain.   She endorses nausea and vomiting x once this AM. She denies new swelling in her lower extremities.   Social history: lives with cats and her sister in law stays with her. She has two adult children who are her healthcare power of attorney. She was a former tobacco smoker, 1 ppd, quit about 15 years. She infrequently drinks etoh, last drink was glass of wine December 2021.  She denies recreational drug use.  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   Vaccinations: two doses of covid [place] of attorney: Darren and Sonia Baller, who are her children   ROS: Constitutional: no weight change, + fever ENT/Mouth: no sore throat, no rhinorrhea Eyes: no eye pain,  no vision changes Cardiovascular: no chest pain, no dyspnea,  no edema, no palpitations Respiratory: no cough, no sputum, no wheezing Gastrointestinal: no nausea, no vomiting, no diarrhea, no constipation Genitourinary: no urinary incontinence, no dysuria, no hematuria Musculoskeletal: no arthralgias, no myalgias. + back pain Skin: no skin lesions, no pruritus, Neuro: + weakness, no loss of consciousness, no syncope Psych: no anxiety, no depression, no decrease appetite Heme/Lymph: no bruising, no bleeding  ED Course: Discussed with EDP, patient requiring hospitalization due to suspected urosepsis with hypotension. Initial vitals in the ED were temperature of 98.4, respiration rate of 18, heart rate 101, blood pressure of 101/43, SPO2 of 97% on room air.  UA was positive for leukocyte and nitrites, WBC 17.1, hemoglobin 11.7, platelets 236, sodium 132, potassium 4.1, chloride 100, bicarb 19, BUN 29, serum creatinine 2.54, serum glucose 130.  Chest x-ray in the emergency department was read as no acute changes, right chest wall port unchanged.  ED provider initially ordered 2 L bolus, LR 150 cc/h however this is stopped as patient has heart failure with reduced EF of less than 20%, global hypokinesis.  Ceftriaxone 1 g IV once was ordered by EDP.  Assessment/Plan  Principal Problem:   Sepsis associated hypotension (HCC) Active Problems:   Hyperlipidemia   History of Hodgkin's lymphoma   Benign neoplasm of sigmoid colon   PAD (peripheral artery disease) (HCC)   Endometrial cancer (HCC)   Chemotherapy-induced peripheral neuropathy (HCC)   Back pain   B12 deficiency anemia   Leukocytosis   HTN (hypertension)   HLD (hyperlipidemia)   Depression with anxiety   Hypothyroidism   PE (pulmonary  thromboembolism) ([place])   GERD (gastroesophageal reflux disease)   UTI (urinary tract infection)   Chronic systolic CHF (congestive heart failure) (HCC)   Cardiomyopathy ([place])   Hypotension    Sepsis with initial hypotension-responsive to fluids-ordered 2 L of LR bolus per EDP -Urine culture, blood cultures ordered -Ceftriaxone 1 g IV daily -Midodrine 5 mg 3 times daily, 3 doses ordered -Admit to stepdown with telemetry  Heart failure reduced ejection fraction-resumed home amiodarone 200 mg daily, Corlanor 5 mg twice daily, Jardiance has been held as it is not formulary  Anxiety/depression-amitriptyline 25 mg nightly, lorazepam 0.5 mg every 6 hours as needed for anxiety  Hypothyroid-levothyroxine 75 mcg daily before breakfast  History of pulmonary embolism-patient is compliant with apixaban -Apixaban 2.5 mg twice daily  Chart reviewed.   DVT prophylaxis: Apixaban 2.5 mg twice daily Code Status: Full code Diet: Heart healthy Family Communication: Updated sister-in-law at bedside Disposition Plan: Pending clinical course Consults called: None at this time Admission status: Observation progressive cardiac with telemetry  Past Medical History:  Diagnosis Date  . Anxiety   . Cervicalgia   . Chronic kidney disease   . Coronary artery disease    a. 02/2012 Stress echo: severe anterior wall ischemia;  b. 02/2012 Cath/PCI: LAD 95p (3.0 x 15 Xience EX DES), D1 90ost (PTCA - bifurcational dzs), EF 45% with anterior HK;  c. 02/2013 Ex MV: fixed anterior defect w/ minor reversibility, nl EF-->Med Rx; d. 11/2019 MV: EF 41%, ant/antlat ischemia-->high risk scan; e. 11/2019 Cath: LM nl, LAD 40ost/p, patent LAD stent, LCX nl, RCA nl, RPDA/RPAV nl, EF 30  . Endometrial cancer ([place])    a. 07/2016 s/p robotic hysterectomy, BSO w/ washings, sentinel node inj, mapping, bx, adhesiolysis.  . Essential hypertension, benign   . Fibrocystic breast disease   . GERD (gastroesophageal reflux disease)   . Gestational hypertension   . Heart murmur   . HFrEF (heart failure with reduced ejection fraction) ([place])    a. 05/2020 Echo: EF <20%, glob HK.  Marland Kitchen History of anemia   . History of blood transfusion    . Hodgkin's lymphoma ([place]) 2011   a. s/p radiation and chemo therapy  . Mixed Ischemic and Nonischemic Cardiomyopathy    a. 05/2020 Echo: EF <20%, glob HK, Nl RV size/fxn, mildly dil LA. Mild MR. Mild to mod Ao sclerosis.  . Osteoarthritis   . Polycystic ovarian disease    Past Surgical History:  Procedure Laterality Date  . ABDOMINAL HYSTERECTOMY    . bladder sling    . CARDIAC CATHETERIZATION  02/2012   [facility] 1 stent place  . CERVICAL POLYPECTOMY    . CHOLECYSTECTOMY  1982  . COLONOSCOPY WITH PROPOFOL N/A 02/05/2015   Procedure: COLONOSCOPY WITH PROPOFOL;  Surgeon: Lucilla Lame, MD;  Location: [facility] ENDOSCOPY;  Service: Endoscopy;  Laterality: N/A;  . CORONARY ANGIOPLASTY  02/2012   left/right s/p balloon  . CYSTOGRAM N/A 08/17/2016   Procedure: CYSTOGRAM;  Surgeon: Hollice Espy, MD;  Location: [facility] ORS;  Service: Urology;  Laterality: N/A;  . CYSTOSCOPY N/A 08/17/2016   Procedure: CYSTOSCOPY EXAM UNDER ANESTHESIA;  Surgeon: Hollice Espy, MD;  Location: [facility] ORS;  Service: Urology;  Laterality: N/A;  . CYSTOSCOPY W/ RETROGRADES Bilateral 08/17/2016   Procedure: CYSTOSCOPY WITH RETROGRADE PYELOGRAM;  Surgeon: Hollice Espy, MD;  Location: [facility] ORS;  Service: Urology;  Laterality: Bilateral;  . CYSTOSCOPY WITH STENT PLACEMENT Right 08/17/2016   Procedure: CYSTOSCOPY WITH STENT PLACEMENT;  Surgeon: Hollice Espy, MD;  Location: [facility]  ORS;  Service: Urology;  Laterality: Right;  . heart stent'  2013  . kidney stent Right 2018  . LYMPH NODE BIOPSY  2011   diagnosis of hodgkins lymphoma  . PELVIC LYMPH NODE DISSECTION N/A 07/29/2016   Procedure: PELVIC/AORTIC LYMPH NODE SAMPLING;  Surgeon: [name], MD;  Location: [facility] ORS;  Service: Gynecology;  Laterality: N/A;  . PORTA CATH INSERTION N/A 09/22/2016   Procedure: Glori Luis Cath Insertion;  Surgeon: [name], MD;  Location: [place] [facility] LAB;  Service: Cardiovascular;  Laterality: N/A;  . PORTA CATH INSERTION N/A  10/27/2019   Procedure: PORTA CATH INSERTION;  Surgeon: [name], MD;  Location: [place] CV LAB;  Service: Cardiovascular;  Laterality: N/A;  . PORTA CATH REMOVAL N/A 11/17/2016   Procedure: Glori Luis Cath Removal;  Surgeon: [name], MD;  Location: [place] CV LAB;  Service: Cardiovascular;  Laterality: N/A;  . RIGHT/LEFT HEART CATH AND CORONARY ANGIOGRAPHY N/A 12/04/2019   Procedure: RIGHT/LEFT HEART CATH AND CORONARY ANGIOGRAPHY;  Surgeon: Wellington Hampshire, MD;  Location: [place] CV LAB;  Service: Cardiovascular;  Laterality: N/A;  . ROBOTIC ASSISTED TOTAL HYSTERECTOMY WITH BILATERAL SALPINGO OOPHERECTOMY N/A 07/29/2016   Procedure: ROBOTIC ASSISTED TOTAL HYSTERECTOMY WITH BILATERAL SALPINGO OOPHORECTOMY;  Surgeon: [name], MD;  Location: [facility] ORS;  Service: Gynecology;  Laterality: N/A;  . SENTINEL NODE BIOPSY N/A 07/29/2016   Procedure: SENTINEL NODE BIOPSY;  Surgeon: [name], MD;  Location: [facility] ORS;  Service: Gynecology;  Laterality: N/A;  . transobturator sling N/A 2009   [place]   Social History:  reports that she quit smoking about 18 years ago. Her smoking use included cigarettes. She has a 30.00 pack-year smoking history. She has never used smokeless tobacco. She reports previous alcohol use. She reports that she does not use drugs.  Allergies  Allergen Reactions  . Metoprolol Other (See Comments)    Fatigue  . Adhesive [Tape] Other (See Comments)    Skin Irritation   . Antifungal [Miconazole Nitrate] Rash  . Sulfa Antibiotics Nausea And Vomiting and Rash  . Z-Pak [Azithromycin] Itching   Family History  Problem Relation Age of Onset  . ALS Father   . Polymyositis Father   . Diabetes Brother   . Dementia Mother   . Cancer Maternal Aunt        breast  . Breast cancer Maternal Aunt        30's  . Stroke Maternal Grandmother   . Cancer Maternal Grandfather        prostate  . Stroke Maternal Grandfather   .  Colon cancer Maternal Aunt   . Non-Hodgkin's lymphoma Cousin    Family history: Family history reviewed and not pertinent  Prior to Admission medications   Medication Sig Start Date End Date Taking? Authorizing Provider  amiodarone (PACERONE) 200 MG tablet Take 1 tablet (200 mg total) by mouth daily. 07/31/20   Dunn, Areta Haber, PA-C  amitriptyline (ELAVIL) 25 MG tablet Take 1 tablet (25 mg total) by mouth at bedtime. May increase weekly as needed 04/02/20   [name], MD  apixaban (ELIQUIS) 2.5 MG TABS tablet Take 1 tablet (2.5 mg total) by mouth 2 (two) times daily. 05/01/20   Sindy Guadeloupe, MD  atorvastatin (LIPITOR) 10 MG tablet Take 1 tablet (10 mg total) by mouth daily. 07/23/20   Wellington Hampshire, MD  cholecalciferol (VITAMIN D3) 25 MCG (1000 UNIT) tablet Take 1,000 Units by mouth in the  morning and at bedtime.    [provider]  cyanocobalamin (,VITAMIN B-12,) 1000 MCG/ML injection Inject 1 mL (1,000 mcg total) into the muscle every 14 (fourteen) days. 07/26/20   [name], MD  empagliflozin (JARDIANCE) 10 MG TABS tablet Take 1 tablet (10 mg total) by mouth daily. 07/16/20   Loel Dubonnet, NP  furosemide (LASIX) 20 MG tablet Take 1 tablet (20 mg total) by mouth as needed for fluid or edema. 07/23/20   Wellington Hampshire, MD  HYDROcodone-homatropine (HYCODAN) 5-1.5 MG/5ML syrup Take 5 mLs by mouth every 6 (six) hours as needed for cough. Patient not taking: Reported on 08/05/2020 06/12/20   Sindy Guadeloupe, MD  ivabradine (CORLANOR) 5 MG TABS tablet Take 1 tablet (5 mg total) by mouth 2 (two) times daily with a meal. 07/16/20   Loel Dubonnet, NP  levothyroxine (SYNTHROID) 75 MCG tablet Take 1 tablet (75 mcg total) by mouth daily before breakfast. 07/15/20   Sindy Guadeloupe, MD  LORazepam (ATIVAN) 0.5 MG tablet Take 1 tablet (0.5 mg total) by mouth every 6 (six) hours as needed for anxiety (and to help with breathing). 02/01/20   Sindy Guadeloupe, MD  Morphine Sulfate (MORPHINE  CONCENTRATE) 10 mg / 0.5 ml concentrated solution Take 0.25 mLs (5 mg total) by mouth every 6 (six) hours as needed for severe pain or shortness of breath. 02/27/20   Sindy Guadeloupe, MD  oxyCODONE (OXY IR/ROXICODONE) 5 MG immediate release tablet Take 5 mg by mouth every 4 (four) hours as needed for severe pain.    [provider]  pantoprazole (PROTONIX) 20 MG tablet Take 1 tablet (20 mg total) by mouth daily. 07/26/20   [name], MD  potassium chloride (KLOR-CON) 10 MEQ tablet Take 1 tablet (10 mEq total) by mouth daily as needed (Take on days that you take the Lasix.). 07/23/20   Wellington Hampshire, MD  prochlorperazine (COMPAZINE) 10 MG tablet Take 1 tablet (10 mg total) by mouth every 6 (six) hours as needed (Nausea or vomiting). Patient taking differently: Take 10 mg by mouth daily as needed for nausea or vomiting.  10/25/19 05/15/20  Sindy Guadeloupe, MD   Physical Exam: Vitals:   08/12/20 1244 08/12/20 1400 08/12/20 1430 08/12/20 1500  BP:  (!) 97/34 (!) 102/36 (!) 101/43  Pulse:  81 (!) 42   Resp:  18 18 (!) 27  Temp:      TempSrc:      SpO2:  100% 95%   Weight: 63.5 kg     Height: 5\' 1"  (1.549 m)      Constitutional: appears age appropriate, chronically ill, frail, NAD, calm, comfortable Eyes: PERRL, lids and conjunctivae normal ENMT: Mucous membranes are moist. Posterior pharynx clear of any exudate or lesions. Age-appropriate dentition. Hearing appropriate Neck: normal, supple, no masses, no thyromegaly Respiratory: clear to auscultation bilaterally, no wheezing, no crackles. Normal respiratory effort. No accessory muscle use.  Cardiovascular: Regular rate and rhythm, no murmurs / rubs / gallops. No extremity edema. 2+ pedal pulses. No carotid bruits.  Right anterior chest wall port in place Abdomen: obese abdomen, no tenderness, no masses palpated, no hepatosplenomegaly. Bowel sounds positive.  Musculoskeletal: no clubbing / cyanosis. No joint deformity upper and lower  extremities. Good ROM, no contractures, no atrophy. Normal muscle tone.  Skin: no rashes, lesions, ulcers. No induration Neurologic: Sensation intact. Strength 5/5 in all 4.  Psychiatric: Normal judgment and insight. Alert and oriented x 3. Normal  mood.   EKG: independently reviewed, showing sinus rhythm with rate of 82, QTc 539, left bundle branch block  Chest x-ray on Admission: I personally reviewed and I agree with radiologist reading as below.  DG Chest Port 1 View  Result Date: 08/12/2020 CLINICAL DATA:  Possible sepsis EXAM: PORTABLE CHEST 1 VIEW COMPARISON:  07/19/2020 FINDINGS: Heart size within normal limits. No pulmonary vascular congestion. Right chest port unchanged in position. Para-mediastinal pulmonary fibrotic changes better seen on prior CT exam. No focal airspace opacity to indicate pneumonia. IMPRESSION: No acute cardiopulmonary process. Electronically Signed   By: Miachel Roux M.D.   On: 08/12/2020 14:02   Labs on Admission: I have personally reviewed following labs  CBC: Recent Labs  Lab 08/12/20 1244  WBC 17.1*  HGB 11.7*  HCT 36.5  MCV 86.9  PLT 149   Basic Metabolic Panel: Recent Labs  Lab 08/12/20 1244  NA 132*  K 4.1  CL 100  CO2 19*  GLUCOSE 130*  BUN 29*  CREATININE 2.54*  CALCIUM 9.3   GFR: Estimated Creatinine Clearance: 17.6 mL/min (A) (by C-G formula based on SCr of 2.54 mg/dL (H)).  Urine analysis:    Component Value Date/Time   COLORURINE YELLOW (A) 07/20/2020 0547   APPEARANCEUR CLEAR (A) 07/20/2020 0547   APPEARANCEUR Clear 09/24/2016 0945   LABSPEC 1.006 07/20/2020 0547   PHURINE 5.0 07/20/2020 0547   GLUCOSEU >=500 (A) 07/20/2020 0547   GLUCOSEU NEGATIVE 07/07/2016 1418   HGBUR MODERATE (A) 07/20/2020 0547   BILIRUBINUR large (A) 08/12/2020 1040   BILIRUBINUR negative 10/02/2019 1627   BILIRUBINUR Negative 09/24/2016 0945   KETONESUR small (15) (A) 08/12/2020 1040   KETONESUR NEGATIVE 07/20/2020 0547   PROTEINUR >=300 (A)  08/12/2020 1040   PROTEINUR NEGATIVE 07/20/2020 0547   UROBILINOGEN 4.0 (A) 08/12/2020 1040   UROBILINOGEN 0.2 07/07/2016 1418   NITRITE Positive (A) 08/12/2020 1040   NITRITE NEGATIVE 07/20/2020 0547   LEUKOCYTESUR Large (3+) (A) 08/12/2020 1040   LEUKOCYTESUR NEGATIVE 07/20/2020 0547   CRITICAL CARE Performed by: Briant Cedar Cox  Total critical care time: 35 minutes  Critical care time was exclusive of separately billable procedures and treating other patients.  Critical care was necessary to treat or prevent imminent or life-threatening deterioration.  Critical care was time spent personally by me on the following activities: development of treatment plan with patient and/or surrogate as well as nursing, discussions with consultants, evaluation of patient's response to treatment, examination of patient, obtaining history from patient or surrogate, ordering and performing treatments and interventions, ordering and review of laboratory studies, ordering and review of radiographic studies, pulse oximetry and re-evaluation of patient's condition.  Amy N Cox D.O. [facility]  If 7PM-7AM, please contact overnight-coverage provider If 7AM-7PM, please contact day coverage provider [email or web address]  08/12/2020, 3:49 PM

## 2020-08-12 NOTE — ED Triage Notes (Signed)
Pt sent from PCP for hematuria with painful urination for the past 3 days, states she normally has blood in her urine from having CA of the paratonium. States her b/p normally runs on the low side 90/30 sometimes. States she has been running a fever also

## 2020-08-12 NOTE — ED Notes (Signed)
Unable to start fluids at they are not compatible with Rocephin. Will start once the rocephin is complete.

## 2020-08-12 NOTE — ED Notes (Signed)
Pt given water and warm blankets.

## 2020-08-12 NOTE — ED Notes (Signed)
Pt stuck multiple times to gain IV access. IV access gained at this time and 2nd blood cultures sent to lab.

## 2020-08-12 NOTE — Progress Notes (Signed)
Pt BP was 76/3 with a MAP of 46, HR-65, O2-95%, and resp were 19. Pt was alert and oriented, able to speak and ambulate to the bedside commode with no signs of distress. Midodrine was given in the ED around 2116. Sharion Settler, Np and Amy Cox, DO made aware. No new orders given, will continue to monitor.

## 2020-08-12 NOTE — ED Notes (Signed)
Pt spiked a fever. This RN notified Cox, DO and gave pt tylenol 325 PO.

## 2020-08-12 NOTE — ED Triage Notes (Addendum)
Pt c/o dysuria sx onset Thursday with hematuria, urgency, frequency. Fever onset yesterday with Tmax 101 that decreased with tylenol. N/v onset yesterday. C/o right flank/lower back pain onset Saturday.  Reports intermittent dizziness since dysuria symptoms onset. Also c/o left ear pain.  Has been taking AZO. No tylenol in past 6 hours. Has had approx 60 ounces of fluids this morning.   Pt states her BP trends low 2/2 medication for her cancer.

## 2020-08-12 NOTE — ED Provider Notes (Signed)
Pt with possible pyelonephritis. She is hypotensive here today. Concern for developing sepsis.  Sending to the ER for further treatment. Stable.    Orvan July, NP 08/12/20 1059

## 2020-08-12 NOTE — ED Notes (Signed)
Per T. Bast, NP, pt advised to go to ER for eval of probably sepsis, 2/2 pyleonephritis, hypotension, n/v, flank pain, fever. Pt verbalized understanding. Patient is being discharged from the Urgent Care and sent to the Emergency Department via POV . Per T. Rozanna Box, NP patient is in need of higher level of care due to sepsis, pyelonephritis, hypotension, fever. Patient is aware and verbalizes understanding of plan of care.  Vitals:   08/12/20 1047 08/12/20 1053  BP: (!) 93/54 (!) 83/51  Pulse: 97   Resp: 20   Temp: 98.2 F (36.8 C)   SpO2: 98%

## 2020-08-12 NOTE — Progress Notes (Addendum)
[place] Room ED26 Manufacturing engineer [facility]) Hospital Liaison RN note:  This is a pending palliative patient with Manufacturing engineer. Teresita Maxey, TOC made aware. [place] Liaison will follow for disposition.  Thank you.  Zandra Abts, RN [facility] Liaison  [phone]

## 2020-08-12 NOTE — ED Notes (Addendum)
Lab to come and collect missing test samples.  MD informed

## 2020-08-12 NOTE — ED Provider Notes (Addendum)
[facility] Emergency Department Provider Note ____________________________________________   Event Date/Time   First MD Initiated Contact with Patient 08/12/20 1332     (approximate)  I have reviewed the triage vital signs and the nursing notes.  HISTORY  Chief Complaint Urinary Frequency   HPI Jaime Hall is a 70 y.o. femalewho presents to the ED for evaluation of urinary frequency and hypertension.  Chart review indicates history of CAD s/p stenting.  History of remote Hodgkin's lymphoma s/p chemo and radiation.  History of endometrial cancer, medicines recurred.  She reports recurrence of cancer in May 2021, she did a couple rounds of chemotherapy, but reports that she is "done now."  Reports living at home with her sister-in-law, who joins her today and provides additional history, and is currently in palliative care.  She reports that she wants no surgeries or interventions beyond medical management.  She is agreeable to medical admission if necessary.  Patient reports dysuria and foul-smelling urine for the past 3 days.  She reports fevers and flank pain started yesterday.  She went to urgent care today where she had a dirty urine dipstick and hypotensive pressures, so sent to the ED for evaluation.  Here in the ED, patient reports that she feels fine.  Denies pain anywhere.  Reports resolution of nausea after taking home Zofran.   Past Medical History:  Diagnosis Date  . Anxiety   . Cervicalgia   . Chronic kidney disease   . Coronary artery disease    a. 02/2012 Stress echo: severe anterior wall ischemia;  b. 02/2012 Cath/PCI: LAD 95p (3.0 x 15 Xience EX DES), D1 90ost (PTCA - bifurcational dzs), EF 45% with anterior HK;  c. 02/2013 Ex MV: fixed anterior defect w/ minor reversibility, nl EF-->Med Rx; d. 11/2019 MV: EF 41%, ant/antlat ischemia-->high risk scan; e. 11/2019 Cath: LM nl, LAD 40ost/p, patent LAD stent, LCX nl, RCA nl, RPDA/RPAV nl, EF 30   . Endometrial cancer ([place])    a. 07/2016 s/p robotic hysterectomy, BSO w/ washings, sentinel node inj, mapping, bx, adhesiolysis.  . Essential hypertension, benign   . Fibrocystic breast disease   . GERD (gastroesophageal reflux disease)   . Gestational hypertension   . Heart murmur   . HFrEF (heart failure with reduced ejection fraction) ([place])    a. 05/2020 Echo: EF <20%, glob HK.  Marland Kitchen History of anemia   . History of blood transfusion   . Hodgkin's lymphoma ([place]) 2011   a. s/p radiation and chemo therapy  . Mixed Ischemic and Nonischemic Cardiomyopathy    a. 05/2020 Echo: EF <20%, glob HK, Nl RV size/fxn, mildly dil LA. Mild MR. Mild to mod Ao sclerosis.  . Osteoarthritis   . Polycystic ovarian disease     Patient Active Problem List   Diagnosis Date Noted  . Chronic systolic CHF (congestive heart failure) ([place]) 07/20/2020  . SVT (supraventricular tachycardia) ([place]) 07/20/2020  . Cardiomyopathy ([place]) 07/20/2020  . Hypotension 07/20/2020  . NSTEMI (non-ST elevated myocardial infarction) ([place]) 07/20/2020  . Acute on chronic combined systolic and diastolic CHF (congestive heart failure) ([place]) 05/19/2020  . PE (pulmonary thromboembolism) ([place]) 05/19/2020  . GERD (gastroesophageal reflux disease) 05/19/2020  . UTI (urinary tract infection) 05/19/2020  . Hospital discharge follow-up 03/02/2020  . Hemoptysis 02/20/2020  . Acute on chronic congestive heart failure ([place])   . Palliative care encounter   . Acute pulmonary embolism ([place]) 02/14/2020  . Thrombocytopenia ([place]) 02/14/2020  . Hypothyroidism 02/14/2020  .  Chest pain 12/29/2019  . Leukocytosis 12/29/2019  . HTN (hypertension) 12/29/2019  . HLD (hyperlipidemia) 12/29/2019  . Elevated troponin 12/29/2019  . Stage 3b chronic kidney disease ([place]) 12/29/2019  . Depression with anxiety 12/29/2019  . Abnormal cardiovascular stress test   . Dyspnea on exertion   . Goals of care, counseling/discussion 10/20/2019  . 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 Peritoneal metastases ([place]) 10/16/2019  . Right anterior knee pain 07/25/2019  . Educated about COVID-19 virus infection 11/15/2018  . Cramps, muscle, general 04/06/2018  . B12 deficiency anemia 07/24/2017  . Normocytic anemia 07/18/2017  . Back pain 07/18/2017  . Chemotherapy-induced peripheral neuropathy ([place]) 12/10/2016  . Hyperlipidemia 08/13/2016  . Hx of colonic polyps 08/13/2016  . Benign neoplasm of sigmoid colon 08/13/2016  . Endometrial cancer ([place]) 08/13/2016  . 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 Lymphedema 07/20/2016  . Chronic venous insufficiency 07/20/2016  . PAD (peripheral artery disease) ([place]) 07/20/2016  . Class 1 obesity due to excess calories without serious comorbidity with body mass index (BMI) of 34.0 to 34.9 in adult 05/27/2016  . Leg cramps 05/27/2016  . Bronchiectasis ([place]) 12/19/2015  . Pre-syncope 12/19/2015  . Prolapsed, uterovaginal, incomplete 06/24/2014  . Routine general medical examination at a health care facility 12/30/2012  . Obesity (BMI 30-39.9) 12/30/2012  . Hx of multiple pulmonary nodules 12/28/2012  . Plantar fasciitis of right foot 12/28/2012  . Hip pain, bilateral 09/12/2012  . History of Hodgkin's lymphoma 09/12/2012  . Coronary artery disease   . Chest pain on exertion 02/29/2012    Past Surgical History:  Procedure Laterality Date  . ABDOMINAL HYSTERECTOMY    . bladder sling    . CARDIAC CATHETERIZATION  02/2012   [facility] 1 stent place  . CERVICAL POLYPECTOMY    . CHOLECYSTECTOMY  1982  . COLONOSCOPY WITH PROPOFOL N/A 02/05/2015   Procedure: COLONOSCOPY WITH PROPOFOL;  Surgeon: Lucilla Lame, MD;  Location: [facility] ENDOSCOPY;  Service: Endoscopy;  Laterality: N/A;  . CORONARY ANGIOPLASTY  02/2012   left/right s/p balloon  . CYSTOGRAM N/A 08/17/2016   Procedure: CYSTOGRAM;  Surgeon: Hollice Espy, MD;  Location: [facility] ORS;  Service: Urology;  Laterality: N/A;  .  CYSTOSCOPY N/A 08/17/2016   Procedure: CYSTOSCOPY EXAM UNDER ANESTHESIA;  Surgeon: Hollice Espy, MD;  Location: [facility] ORS;  Service: Urology;  Laterality: N/A;  . CYSTOSCOPY W/ RETROGRADES Bilateral 08/17/2016   Procedure: CYSTOSCOPY WITH RETROGRADE PYELOGRAM;  Surgeon: Hollice Espy, MD;  Location: [facility] ORS;  Service: Urology;  Laterality: Bilateral;  . CYSTOSCOPY WITH STENT PLACEMENT Right 08/17/2016   Procedure: CYSTOSCOPY WITH STENT PLACEMENT;  Surgeon: Hollice Espy, MD;  Location: [facility] ORS;  Service: Urology;  Laterality: Right;  . heart stent'  2013  . kidney stent Right 2018  . LYMPH NODE BIOPSY  2011   diagnosis of hodgkins lymphoma  . PELVIC LYMPH NODE DISSECTION N/A 07/29/2016   Procedure: PELVIC/AORTIC LYMPH NODE SAMPLING;  Surgeon: [name], MD;  Location: [facility] ORS;  Service: Gynecology;  Laterality: N/A;  . PORTA CATH INSERTION N/A 09/22/2016   Procedure: Glori Luis Cath Insertion;  Surgeon: [name], MD;  Location: [place] [facility];  Service: Cardiovascular;  Laterality: N/A;  . PORTA CATH INSERTION N/A 10/27/2019   Procedure: PORTA CATH INSERTION;  Surgeon: [name], MD;  Location: [place] [facility];  Service: Cardiovascular;  Laterality: N/A;  . PORTA CATH REMOVAL N/A 11/17/2016   Procedure: Glori Luis Cath Removal;  Surgeon:  Schnier, Dolores Lory, MD;  Location: [place] [facility];  Service: Cardiovascular;  Laterality: N/A;  . RIGHT/LEFT HEART CATH AND CORONARY ANGIOGRAPHY N/A 12/04/2019   Procedure: RIGHT/LEFT HEART CATH AND CORONARY ANGIOGRAPHY;  Surgeon: Wellington Hampshire, MD;  Location: [place] [facility];  Service: Cardiovascular;  Laterality: N/A;  . ROBOTIC ASSISTED TOTAL HYSTERECTOMY WITH BILATERAL SALPINGO OOPHERECTOMY N/A 07/29/2016   Procedure: ROBOTIC ASSISTED TOTAL HYSTERECTOMY WITH BILATERAL SALPINGO OOPHORECTOMY;  Surgeon: [name], MD;  Location: [facility] ORS;  Service: Gynecology;  Laterality: N/A;  . SENTINEL NODE BIOPSY N/A  07/29/2016   Procedure: SENTINEL NODE BIOPSY;  Surgeon: [name], MD;  Location: [facility] ORS;  Service: Gynecology;  Laterality: N/A;  . transobturator sling N/A 2009   [place]    Prior to Admission medications   Medication Sig Start Date End Date Taking? Authorizing Provider  amiodarone (PACERONE) 200 MG tablet Take 1 tablet (200 mg total) by mouth daily. 07/31/20   Dunn, Areta Haber, PA-C  amitriptyline (ELAVIL) 25 MG tablet Take 1 tablet (25 mg total) by mouth at bedtime. May increase weekly as needed 04/02/20   [name], MD  apixaban (ELIQUIS) 2.5 MG TABS tablet Take 1 tablet (2.5 mg total) by mouth 2 (two) times daily. 05/01/20   Sindy Guadeloupe, MD  atorvastatin (LIPITOR) 10 MG tablet Take 1 tablet (10 mg total) by mouth daily. 07/23/20   Wellington Hampshire, MD  cholecalciferol (VITAMIN D3) 25 MCG (1000 UNIT) tablet Take 1,000 Units by mouth in the morning and at bedtime.    [provider]  cyanocobalamin (,VITAMIN B-12,) 1000 MCG/ML injection Inject 1 mL (1,000 mcg total) into the muscle every 14 (fourteen) days. 07/26/20   [name], MD  empagliflozin (JARDIANCE) 10 MG TABS tablet Take 1 tablet (10 mg total) by mouth daily. 07/16/20   Loel Dubonnet, NP  furosemide (LASIX) 20 MG tablet Take 1 tablet (20 mg total) by mouth as needed for fluid or edema. 07/23/20   Wellington Hampshire, MD  HYDROcodone-homatropine (HYCODAN) 5-1.5 MG/5ML syrup Take 5 mLs by mouth every 6 (six) hours as needed for cough. Patient not taking: Reported on 08/05/2020 06/12/20   Sindy Guadeloupe, MD  ivabradine (CORLANOR) 5 MG TABS tablet Take 1 tablet (5 mg total) by mouth 2 (two) times daily with a meal. 07/16/20   Loel Dubonnet, NP  levothyroxine (SYNTHROID) 75 MCG tablet Take 1 tablet (75 mcg total) by mouth daily before breakfast. 07/15/20   Sindy Guadeloupe, MD  LORazepam (ATIVAN) 0.5 MG tablet Take 1 tablet (0.5 mg total) by mouth every 6 (six) hours as needed for anxiety (and to help with  breathing). 02/01/20   Sindy Guadeloupe, MD  Morphine Sulfate (MORPHINE CONCENTRATE) 10 mg / 0.5 ml concentrated solution Take 0.25 mLs (5 mg total) by mouth every 6 (six) hours as needed for severe pain or shortness of breath. 02/27/20   Sindy Guadeloupe, MD  oxyCODONE (OXY IR/ROXICODONE) 5 MG immediate release tablet Take 5 mg by mouth every 4 (four) hours as needed for severe pain.    [provider]  pantoprazole (PROTONIX) 20 MG tablet Take 1 tablet (20 mg total) by mouth daily. 07/26/20   [name], MD  potassium chloride (KLOR-CON) 10 MEQ tablet Take 1 tablet (10 mEq total) by mouth daily as needed (Take on days that you take the Lasix.). 07/23/20   Wellington Hampshire, MD  prochlorperazine (COMPAZINE) 10 MG tablet  Take 1 tablet (10 mg total) by mouth every 6 (six) hours as needed (Nausea or vomiting). Patient taking differently: Take 10 mg by mouth daily as needed for nausea or vomiting.  10/25/19 05/15/20  Sindy Guadeloupe, MD    Allergies Metoprolol, Adhesive [tape], Antifungal [miconazole nitrate], Sulfa antibiotics, and Z-pak [azithromycin]  Family History  Problem Relation Age of Onset  . ALS Father   . Polymyositis Father   . Diabetes Brother   . Dementia Mother   . Cancer Maternal Aunt        breast  . Breast cancer Maternal Aunt        30's  . Stroke Maternal Grandmother   . Cancer Maternal Grandfather        prostate  . Stroke Maternal Grandfather   . Colon cancer Maternal Aunt   . Non-Hodgkin's lymphoma Cousin     Social History Social History   Tobacco Use  . Smoking status: Former Smoker    Packs/day: 1.00    Years: 30.00    Pack years: 30.00    Types: Cigarettes    Quit date: 07/24/2002    Years since quitting: 18.0  . Smokeless tobacco: Never Used  . Tobacco comment: quit smoking in 2000  Vaping Use  . Vaping Use: Never used  Substance Use Topics  . Alcohol use: Not Currently  . Drug use: No    Review of Systems  Constitutional: No  fever/chills Eyes: No visual changes. ENT: No sore throat. Cardiovascular: Denies chest pain. Respiratory: Denies shortness of breath. Gastrointestinal:    No diarrhea.  No constipation.  Positive for suprapubic abdominal discomfort and nausea. Genitourinary: Positive for for dysuria. Musculoskeletal: Negative for back pain. Skin: Negative for rash. Neurological: Negative for headaches, focal weakness or numbness.  ____________________________________________   PHYSICAL EXAM:  VITAL SIGNS: Vitals:   08/12/20 1430 08/12/20 1500  BP: (!) 102/36 (!) 101/43  Pulse: (!) 42   Resp: 18 (!) 27  Temp:    SpO2: 95%      Constitutional: Alert and oriented. Well appearing and in no acute distress. Eyes: Conjunctivae are normal. PERRL. EOMI. Head: Atraumatic. Nose: No congestion/rhinnorhea. Mouth/Throat: Mucous membranes are dry.  Oropharynx non-erythematous. Neck: No stridor. No cervical spine tenderness to palpation. Cardiovascular: Normal rate, regular rhythm. Grossly normal heart sounds.  Good peripheral circulation. Respiratory: Normal respiratory effort.  No retractions. Lungs CTAB. Gastrointestinal: Soft , nondistended.  Suprapubic tenderness to palpation without peritoneal features.  Upper abdomen is benign. Musculoskeletal: No lower extremity tenderness nor edema.  No joint effusions. No signs of acute trauma. Neurologic:  Normal speech and language. No gross focal neurologic deficits are appreciated. No gait instability noted. Skin:  Skin is warm, dry and intact. No rash noted. Psychiatric: Mood and affect are normal. Speech and behavior are normal.  ____________________________________________   LABS (all labs ordered are listed, but only abnormal results are displayed)  Labs Reviewed  BASIC METABOLIC PANEL - Abnormal; Notable for the following components:      Result Value   Sodium 132 (*)    CO2 19 (*)    Glucose, Bld 130 (*)    BUN 29 (*)    Creatinine, Ser 2.54  (*)    GFR, Estimated 20 (*)    All other components within normal limits  CBC - Abnormal; Notable for the following components:   WBC 17.1 (*)    Hemoglobin 11.7 (*)    RDW 19.8 (*)    All other components within  normal limits  CULTURE, BLOOD (ROUTINE X 2)  CULTURE, BLOOD (ROUTINE X 2)  URINE CULTURE  SARS CORONAVIRUS 2 (TAT 6-24 HRS)  URINALYSIS, COMPLETE (UACMP) WITH MICROSCOPIC  LACTIC ACID, PLASMA  LACTIC ACID, PLASMA  PROTIME-INR  APTT   ____________________________________________  12 Lead EKG  Sinus rhythm, rate of 82 bpm.  Left bundle branch block.  Normal intervals.  No evidence of acute ischemia per [place] criteria. ____________________________________________  RADIOLOGY  ED MD interpretation: 1 view CXR reviewed by me without evidence of acute cardiopulmonary pathology.  Official radiology report(s): DG Chest Port 1 View  Result Date: 08/12/2020 CLINICAL DATA:  Possible sepsis EXAM: PORTABLE CHEST 1 VIEW COMPARISON:  07/19/2020 FINDINGS: Heart size within normal limits. No pulmonary vascular congestion. Right chest port unchanged in position. Para-mediastinal pulmonary fibrotic changes better seen on prior CT exam. No focal airspace opacity to indicate pneumonia. IMPRESSION: No acute cardiopulmonary process. Electronically Signed   By: Miachel Roux M.D.   On: 08/12/2020 14:02   ____________________________________________   PROCEDURES and INTERVENTIONS  Procedure(s) performed (including Critical Care):  .1-3 Lead EKG Interpretation Performed by: Vladimir Crofts, MD Authorized by: Vladimir Crofts, MD     Interpretation: normal     ECG rate:  70   ECG rate assessment: normal     Rhythm: sinus rhythm     Ectopy: none     Conduction: normal   .Critical Care Performed by: Vladimir Crofts, MD Authorized by: Vladimir Crofts, MD   Critical care provider statement:    Critical care time (minutes):  45   Critical care was necessary to treat or prevent imminent or  life-threatening deterioration of the following conditions:  Sepsis   Critical care was time spent personally by me on the following activities:  Discussions with consultants, evaluation of patient's response to treatment, examination of patient, ordering and performing treatments and interventions, ordering and review of laboratory studies, ordering and review of radiographic studies, pulse oximetry, re-evaluation of patient's condition, obtaining history from patient or surrogate and review of old charts    Medications  lactated ringers infusion (has no administration in time range)  cefTRIAXone (ROCEPHIN) 1 g in sodium chloride 0.9 % 100 mL IVPB (0 g Intravenous Stopped 08/12/20 1458)  ondansetron (ZOFRAN) injection 4 mg (0 mg Intravenous Hold 08/12/20 1406)  lactated ringers bolus 2,000 mL (2,000 mLs Intravenous New Bag/Given 08/12/20 1458)    ____________________________________________   MDM / ED COURSE   70 year old DNR presents to the ED with evidence of urosepsis requiring medical admission.  Hypotensive pressures initially, resolved with IVF, and vitals otherwise normal on room air.  No evidence of septic shock.  Exam with suprapubic tenderness to palpation without peritoneal features.  She is dry mucous membranes and stigmata of dehydration.  Outpatient urine dipstick reviewed with nitrate positive, large leuks and consistent with infection.  Blood work here with leukocytosis, AKI.  Sepsis protocols were followed and cultures were drawn.  Rocephin was provided.  Awaiting lactic at the time of admission.  Will discuss the case with hospitalist medicine for further work-up and management    Clinical Course as of 08/12/20 1535  Mon Aug 12, 2020  1534 Reassessed.  Continues be normotensive.  Patient denies any respiratory complaints.  I discontinued IV fluids, she has received a total of 1 L. [DS]    Clinical Course User Index [DS] Vladimir Crofts, MD     ____________________________________________   FINAL CLINICAL IMPRESSION(S) / ED DIAGNOSES  Final diagnoses:  Acute cystitis without  hematuria  Sepsis with acute renal failure and tubular necrosis without septic shock, due to unspecified organism [facility])     ED Discharge Orders    None       Dylan Smith   Note:  This document was prepared using Dragon voice recognition software and may include unintentional dictation errors.   Vladimir Crofts, MD 08/12/20 1517    Vladimir Crofts, MD 08/12/20 [phone]

## 2020-08-12 NOTE — Consult Note (Signed)
CODE SEPSIS - PHARMACY COMMUNICATION  **Broad Spectrum Antibiotics should be administered within 1 hour of Sepsis diagnosis**  Time Code Sepsis Called/Page Received: 1337  Antibiotics Ordered: 1345  Time of 1st antibiotic administration: 1417  Additional action taken by pharmacy: N/A  If necessary, Name of Provider/Nurse Contacted: N/A   Lorna Dibble ,PharmD Clinical Pharmacist  08/12/2020  1:43 PM

## 2020-08-13 ENCOUNTER — Other Ambulatory Visit: Payer: Self-pay | Admitting: Internal Medicine

## 2020-08-13 DIAGNOSIS — Z20822 Contact with and (suspected) exposure to covid-19: Secondary | ICD-10-CM | POA: Diagnosis present

## 2020-08-13 DIAGNOSIS — I420 Dilated cardiomyopathy: Secondary | ICD-10-CM | POA: Diagnosis present

## 2020-08-13 DIAGNOSIS — C541 Malignant neoplasm of endometrium: Secondary | ICD-10-CM | POA: Diagnosis present

## 2020-08-13 DIAGNOSIS — N1832 Chronic kidney disease, stage 3b: Secondary | ICD-10-CM | POA: Diagnosis present

## 2020-08-13 DIAGNOSIS — N3000 Acute cystitis without hematuria: Secondary | ICD-10-CM | POA: Diagnosis present

## 2020-08-13 DIAGNOSIS — I739 Peripheral vascular disease, unspecified: Secondary | ICD-10-CM | POA: Diagnosis present

## 2020-08-13 DIAGNOSIS — Z923 Personal history of irradiation: Secondary | ICD-10-CM | POA: Diagnosis not present

## 2020-08-13 DIAGNOSIS — A419 Sepsis, unspecified organism: Secondary | ICD-10-CM | POA: Diagnosis present

## 2020-08-13 DIAGNOSIS — I959 Hypotension, unspecified: Secondary | ICD-10-CM | POA: Diagnosis not present

## 2020-08-13 DIAGNOSIS — K219 Gastro-esophageal reflux disease without esophagitis: Secondary | ICD-10-CM | POA: Diagnosis present

## 2020-08-13 DIAGNOSIS — Z8571 Personal history of Hodgkin lymphoma: Secondary | ICD-10-CM | POA: Diagnosis not present

## 2020-08-13 DIAGNOSIS — I130 Hypertensive heart and chronic kidney disease with heart failure and stage 1 through stage 4 chronic kidney disease, or unspecified chronic kidney disease: Secondary | ICD-10-CM | POA: Diagnosis present

## 2020-08-13 DIAGNOSIS — Z66 Do not resuscitate: Secondary | ICD-10-CM | POA: Diagnosis present

## 2020-08-13 DIAGNOSIS — Z9221 Personal history of antineoplastic chemotherapy: Secondary | ICD-10-CM | POA: Diagnosis not present

## 2020-08-13 DIAGNOSIS — R300 Dysuria: Secondary | ICD-10-CM

## 2020-08-13 DIAGNOSIS — G620 Drug-induced polyneuropathy: Secondary | ICD-10-CM | POA: Diagnosis present

## 2020-08-13 DIAGNOSIS — Z86711 Personal history of pulmonary embolism: Secondary | ICD-10-CM | POA: Diagnosis not present

## 2020-08-13 DIAGNOSIS — I471 Supraventricular tachycardia: Secondary | ICD-10-CM | POA: Diagnosis present

## 2020-08-13 DIAGNOSIS — A4151 Sepsis due to Escherichia coli [E. coli]: Secondary | ICD-10-CM | POA: Diagnosis present

## 2020-08-13 DIAGNOSIS — T451X5A Adverse effect of antineoplastic and immunosuppressive drugs, initial encounter: Secondary | ICD-10-CM | POA: Diagnosis present

## 2020-08-13 DIAGNOSIS — N170 Acute kidney failure with tubular necrosis: Secondary | ICD-10-CM | POA: Diagnosis present

## 2020-08-13 DIAGNOSIS — E785 Hyperlipidemia, unspecified: Secondary | ICD-10-CM | POA: Diagnosis present

## 2020-08-13 DIAGNOSIS — I5022 Chronic systolic (congestive) heart failure: Secondary | ICD-10-CM | POA: Diagnosis present

## 2020-08-13 DIAGNOSIS — D519 Vitamin B12 deficiency anemia, unspecified: Secondary | ICD-10-CM | POA: Diagnosis present

## 2020-08-13 DIAGNOSIS — F418 Other specified anxiety disorders: Secondary | ICD-10-CM | POA: Diagnosis present

## 2020-08-13 DIAGNOSIS — Z515 Encounter for palliative care: Secondary | ICD-10-CM | POA: Diagnosis not present

## 2020-08-13 DIAGNOSIS — E039 Hypothyroidism, unspecified: Secondary | ICD-10-CM | POA: Diagnosis present

## 2020-08-13 LAB — PROTIME-INR
INR: 1.7 — ABNORMAL HIGH (ref 0.8–1.2)
Prothrombin Time: 19.7 seconds — ABNORMAL HIGH (ref 11.4–15.2)

## 2020-08-13 LAB — SARS CORONAVIRUS 2 (TAT 6-24 HRS): SARS Coronavirus 2: NEGATIVE

## 2020-08-13 LAB — APTT: aPTT: 47 seconds — ABNORMAL HIGH (ref 24–36)

## 2020-08-13 LAB — LACTIC ACID, PLASMA: Lactic Acid, Venous: 1.4 mmol/L (ref 0.5–1.9)

## 2020-08-13 LAB — GLUCOSE, CAPILLARY: Glucose-Capillary: 110 mg/dL — ABNORMAL HIGH (ref 70–99)

## 2020-08-13 MED ORDER — MIDODRINE HCL 5 MG PO TABS
10.0000 mg | ORAL_TABLET | Freq: Three times a day (TID) | ORAL | Status: DC
  Administered 2020-08-13 – 2020-08-15 (×6): 10 mg via ORAL
  Filled 2020-08-13 (×7): qty 2

## 2020-08-13 MED ORDER — ENSURE ENLIVE PO LIQD
237.0000 mL | Freq: Two times a day (BID) | ORAL | Status: DC
  Administered 2020-08-13 – 2020-08-14 (×2): 237 mL via ORAL

## 2020-08-13 MED ORDER — CIPROFLOXACIN HCL 250 MG PO TABS
250.0000 mg | ORAL_TABLET | Freq: Two times a day (BID) | ORAL | 0 refills | Status: DC
Start: 2020-08-13 — End: 2020-08-15

## 2020-08-13 MED ORDER — ADULT MULTIVITAMIN W/MINERALS CH
1.0000 | ORAL_TABLET | Freq: Every day | ORAL | Status: DC
  Administered 2020-08-14 – 2020-08-15 (×2): 1 via ORAL
  Filled 2020-08-13 (×2): qty 1

## 2020-08-13 NOTE — Plan of Care (Signed)
Pt overall had good day. Up to chair for several hours, stable on feet. BP low, MAP goal >55 and within range after increase in midodrine. Per pt, BP usually runs low. Tolerating diet well, afebrile, voiding in bedside commode. Visited by friend for most of day.   Problem: Education: Goal: Knowledge of General Education information will improve Description: Including pain rating scale, medication(s)/side effects and non-pharmacologic comfort measures Outcome: Progressing   Problem: Health Behavior/Discharge Planning: Goal: Ability to manage health-related needs will improve Outcome: Progressing   Problem: Clinical Measurements: Goal: Ability to maintain clinical measurements within normal limits will improve Outcome: Progressing Goal: Will remain free from infection Outcome: Progressing Goal: Diagnostic test results will improve Outcome: Progressing Goal: Respiratory complications will improve Outcome: Progressing Goal: Cardiovascular complication will be avoided Outcome: Progressing   Problem: Activity: Goal: Risk for activity intolerance will decrease Outcome: Progressing   Problem: Nutrition: Goal: Adequate nutrition will be maintained Outcome: Progressing   Problem: Coping: Goal: Level of anxiety will decrease Outcome: Progressing   Problem: Elimination: Goal: Will not experience complications related to bowel motility Outcome: Progressing Goal: Will not experience complications related to urinary retention Outcome: Progressing   Problem: Pain Managment: Goal: General experience of comfort will improve Outcome: Progressing   Problem: Safety: Goal: Ability to remain free from injury will improve Outcome: Progressing   Problem: Skin Integrity: Goal: Risk for impaired skin integrity will decrease Outcome: Progressing

## 2020-08-13 NOTE — Telephone Encounter (Signed)
Called patient, she is in the ED.

## 2020-08-13 NOTE — Telephone Encounter (Signed)
Called patient and she is still in the ED.

## 2020-08-13 NOTE — Progress Notes (Signed)
Initial Nutrition Assessment  DOCUMENTATION CODES:   Not applicable  INTERVENTION:   Ensure Enlive po BID, each supplement provides 350 kcal and 20 grams of protein  MVI po daily   Liberalize diet   Pt at high refeed risk; recommend monitor potassium, magnesium and phosphorus labs daily until stable  NUTRITION DIAGNOSIS:   Increased nutrient needs related to cancer and cancer related treatments as evidenced by estimated needs.  GOAL:   Patient will meet greater than or equal to 90% of their needs  MONITOR:   PO intake,Supplement acceptance,Labs,Weight trends,Skin,I & O's  REASON FOR ASSESSMENT:   Malnutrition Screening Tool    ASSESSMENT:   70 y/o female with medical history significant for heart failure with reduced ejection fraction, EF of less than 20%, endometrial cancer status post chemo/radiation, currently on palliative care, CKD 3B, chemotherapy-induced peripheral neuropathy and B12 deficiency, NSTEMI, dilated cardiomyopathy, gastroenteritis, pulmonary embolism, hypothyroid, anxiety/depression, chronic pain, lymphoma and GERD who is admitted with UTI, sepsis and hypothension  RD working remotely.  Unable to reach pt by phone. Per chart review, pt eating eating <50% of meals during her last admit. RD suspects pt with decreased appetite and oral intake at baseline. Per chart, pt is down 25lbs(15%) over the past year with 20lbs(12%) of that weight loss being over the past 6 months which is significant. Pt is followed by palliative care. RD will add supplements and MVI to help pt meet her estimated needs. RD will also liberalize pt's diet. RD will obtain nutrition related history and exam at follow-up.    Medications reviewed and include: synthroid, zofan, protonix, ceftriaxone   Labs reviewed: Na 132(L), BUN 29(H), creat 2.54(H) Wbc- 17.1(H)  NUTRITION - FOCUSED PHYSICAL EXAM: Unable to perform at this time   Diet Order:   Diet Order            Diet 2 gram  sodium Room service appropriate? Yes; Fluid consistency: Thin  Diet effective now                EDUCATION NEEDS:   No education needs have been identified at this time  Skin:  Skin Assessment: Reviewed RN Assessment  Last BM:  pta  Height:   Ht Readings from Last 1 Encounters:  08/12/20 5\' 1"  (1.549 m)    Weight:   Wt Readings from Last 1 Encounters:  08/12/20 63.8 kg    Ideal Body Weight:  47.7 kg  BMI:  Body mass index is 26.58 kg/m.  Estimated Nutritional Needs:   Kcal:  1600-1800kcal/day  Protein:  80-90g/day  Fluid:  1.4-1.6L/day  Koleen Distance MS, RD, LDN Please refer to [facility] for RD and/or RD on-call/weekend/after hours pager

## 2020-08-13 NOTE — Progress Notes (Signed)
PROGRESS NOTE    Jaime Hall  WIO:[id number] DOB: [birth year] DOA: 08/12/2020 PCP: [name], MD   Brief Narrative: Taken from H&P. Jaime Hall is a 70 y.o. female with medical history significant for heart failure with reduced ejection fraction, EF of less than 20%, echo 07/22/2020, endometrial cancer, status post chemoradiation, now currently on palliative care, CKD 3B, chemotherapy-induced peripheral neuropathy, B12 deficiency, NSTEMI, dilated cardiomyopathy, gastroenteritis, history of pulmonary embolism on Eliquis 2.5 mg twice daily, hypothyroid, anxiety, chronic pain, GERD, presented to the emergency department for chief concerns of fever and right flank pain.  On presentation she was febrile, she received 2 L of boluses for hypotension, later started on midodrine, IV fluid discontinued due to low EF.  Levo was ordered but never given. Admitted for sepsis secondary to UTI. Urine cultures with E. Coli-pending susceptibility.  Blood cultures remain negative.  Blood pressure remains softer, otherwise seems improving, increase the dose of midodrine to 10 mg 3 times daily.  Subjective: Patient thinks that she is improving today.  No new complaint.  Lying comfortably in bed.  Sister-in-law at bedside.  Per patient she is done with her cancer treatment and does not want to pursue any more.  When asked about hematuria she said she to get intermittent vaginal bleeding and that can also cause her urine to look like red.  Assessment & Plan:   Principal Problem:   Sepsis associated hypotension (HCC) Active Problems:   Hyperlipidemia   History of Hodgkin's lymphoma   Benign neoplasm of sigmoid colon   PAD (peripheral artery disease) (HCC)   Endometrial cancer (HCC)   Chemotherapy-induced peripheral neuropathy (HCC)   Back pain   B12 deficiency anemia   Leukocytosis   HTN (hypertension)   HLD (hyperlipidemia)   Depression with anxiety   Hypothyroidism   PE (pulmonary  thromboembolism) (HCC)   GERD (gastroesophageal reflux disease)   UTI (urinary tract infection)   Chronic systolic CHF (congestive heart failure) ([place])   Cardiomyopathy ([place])   Hypotension   Sepsis ([place])  Sepsis secondary to UTI.  Patient met sepsis criteria with being febrile, leukocytosis and tachypneic and positive urine cultures.  Patient blood pressure remained little softer this morning.  Never required any Levophed.  Procalcitonin at 1.4, urine cultures with E. coli-pending susceptibility.  Blood cultures remain negative. -Continue with ceftriaxone-we will de-escalate according to sensitivity results. -Increase the dose of midodrine to 10 mg 3 times daily. -Can be transferred to progressive care unit.  HFrEF.  EF of less than 20%.  Patient appears euvolemic. -Continue home dose of amiodarone, Corlanor and Lipitor.  Stage IV endometrial cancer.  Patient is currently under hospice care. She is not a candidate for more chemo or radiation. -Continue home oxycodone as needed for pain  Hypothyroidism. -Continue home dose of Synthroid  History of PE. -Continue home dose of apixaban at 2.5 mg twice daily  History of anxiety/depression.  No acute concern. -Continue home dose of amitriptyline and as needed lorazepam.   Objective: Vitals:   08/13/20 0900 08/13/20 1000 08/13/20 1100 08/13/20 1200  BP: (!) 89/40 (!) 92/44  (!) 94/43  Pulse: 77 77 81 61  Resp: _0 (!) 26  Temp:    97.7 F (36.5 C)  TempSrc:    Axillary  SpO2: 95% 95% 100% 95%  Weight:      Height:        Intake/Output Summary (Last 24 hours) at 08/13/2020 1259 Last data filed at 08/13/2020 [phone]  Gross per 24 hour  Intake --  Output 400 ml  Net -400 ml   Filed Weights   08/12/20 1244 08/12/20 2137  Weight: 63.5 kg 63.8 kg    Examination:  General exam: Frail elderly lady, appears calm and comfortable  Respiratory system: Clear to auscultation. Respiratory effort normal. Cardiovascular system: S1 &  S2 heard, RRR.  Gastrointestinal system: Soft, nontender, nondistended, bowel sounds positive. Central nervous system: Alert and oriented. No focal neurological deficits. Extremities: No edema, no cyanosis, pulses intact and symmetrical. Psychiatry: Judgement and insight appear normal. Mood & affect appropriate.    DVT prophylaxis: Eliquis Code Status: DNR Family Communication: Sister-in-law was updated at bedside Disposition Plan:  Status is: Inpatient  Remains inpatient appropriate because:Inpatient level of care appropriate due to severity of illness   Dispo: The patient is from: Home              Anticipated d/c is to: Home              Patient currently is not medically stable to d/c.   Difficult to place patient No               Level of care: Stepdown  All the records are reviewed and case discussed with Care Management/Social Worker. Management plans discussed with the patient, nursing and they are in agreement.  Consultants:   PCCM  Procedures:  Antimicrobials: Ceftriaxone  Data Reviewed: I have personally reviewed following labs and imaging studies  CBC: Recent Labs  Lab 08/12/20 1244  WBC 17.1*  HGB 11.7*  HCT 36.5  MCV 86.9  PLT 275   Basic Metabolic Panel: Recent Labs  Lab 08/12/20 1244  NA 132*  K 4.1  CL 100  CO2 19*  GLUCOSE 130*  BUN 29*  CREATININE 2.54*  CALCIUM 9.3   GFR: Estimated Creatinine Clearance: 17.6 mL/min (A) (by C-G formula based on SCr of 2.54 mg/dL (H)). Liver Function Tests: No results for input(s): AST, ALT, ALKPHOS, BILITOT, PROT, ALBUMIN in the last 168 hours. No results for input(s): LIPASE, AMYLASE in the last 168 hours. No results for input(s): AMMONIA in the last 168 hours. Coagulation Profile: Recent Labs  Lab 08/13/20 0729  INR 1.7*   Cardiac Enzymes: No results for input(s): CKTOTAL, CKMB, CKMBINDEX, TROPONINI in the last 168 hours. BNP (last 3 results) No results for input(s): PROBNP in the last 8760  hours. HbA1C: No results for input(s): HGBA1C in the last 72 hours. CBG: Recent Labs  Lab 08/12/20 2128  GLUCAP 110*   Lipid Profile: No results for input(s): CHOL, HDL, LDLCALC, TRIG, CHOLHDL, LDLDIRECT in the last 72 hours. Thyroid Function Tests: No results for input(s): TSH, T4TOTAL, FREET4, T3FREE, THYROIDAB in the last 72 hours. Anemia Panel: No results for input(s): VITAMINB12, FOLATE, FERRITIN, TIBC, IRON, RETICCTPCT in the last 72 hours. Sepsis Labs: Recent Labs  Lab 08/12/20 1546 08/12/20 1620 08/13/20 0729  PROCALCITON 1.40  --   --   LATICACIDVEN  --  1.9 1.4    Recent Results (from the past 240 hour(s))  Urine Culture     Status: Abnormal (Preliminary result)   Collection Time: 08/12/20 10:52 AM   Specimen: Urine, Random  Result Value Ref Range Status   Specimen Description URINE, RANDOM  Final   Special Requests NONE  Final   Culture (A)  Final    >=100,000 COLONIES/mL ESCHERICHIA COLI SUSCEPTIBILITIES TO FOLLOW Performed at [facility], [address] [place], [place] [address]  Report Status PENDING  Incomplete  Blood Culture (routine x 2)     Status: None (Preliminary result)   Collection Time: 08/12/20  2:00 PM   Specimen: BLOOD  Result Value Ref Range Status   Specimen Description BLOOD LEFT ANTECUBITAL  Final   Special Requests   Final    BOTTLES DRAWN AEROBIC AND ANAEROBIC Blood Culture results may not be optimal due to an inadequate volume of blood received in culture bottles   Culture   Final    NO GROWTH < 24 HOURS Performed at [facility], [address] [place], [place] [address]    Report Status PENDING  Incomplete  SARS CORONAVIRUS 2 (TAT 6-24 HRS) Nasopharyngeal Nasopharyngeal Swab     Status: None   Collection Time: 08/12/20  3:14 PM   Specimen: Nasopharyngeal Swab  Result Value Ref Range Status   SARS Coronavirus 2 NEGATIVE NEGATIVE Final    Comment: (NOTE) SARS-CoV-2 target nucleic acids are NOT  DETECTED.  The SARS-CoV-2 RNA is generally detectable in upper and lower respiratory specimens during the acute phase of infection. Negative results do not preclude SARS-CoV-2 infection, do not rule out co-infections with other pathogens, and should not be used as the sole basis for treatment or other patient management decisions. Negative results must be combined with clinical observations, patient history, and epidemiological information. The expected result is Negative.  Fact Sheet for Patients: SugarRoll.be  Fact Sheet for Healthcare Providers: https://www.woods-mathews.com/  This test is not yet approved or cleared by the [place] FDA and  has been authorized for detection and/or diagnosis of SARS-CoV-2 by FDA under an Emergency Use Authorization (EUA). This EUA will remain  in effect (meaning this test can be used) for the duration of the COVID-19 declaration under Se ction 564(b)(1) of the Act, 21 U.S.C. section 360bbb-3(b)(1), unless the authorization is terminated or revoked sooner.  Performed at [facility], [place] [address], [place], [place] [address]   Blood Culture (routine x 2)     Status: None (Preliminary result)   Collection Time: 08/12/20  3:33 PM   Specimen: BLOOD  Result Value Ref Range Status   Specimen Description BLOOD BLOOD RIGHT HAND  Final   Special Requests   Final    BOTTLES DRAWN AEROBIC ONLY Blood Culture results may not be optimal due to an inadequate volume of blood received in culture bottles   Culture   Final    NO GROWTH < 24 HOURS Performed at [facility], [address] [place], [place] [address]    Report Status PENDING  Incomplete  MRSA PCR Screening     Status: None   Collection Time: 08/12/20  9:32 PM   Specimen: Nasal Mucosa; Nasopharyngeal  Result Value Ref Range Status   MRSA by PCR NEGATIVE NEGATIVE Final    Comment:        The GeneXpert MRSA Assay (FDA approved  for NASAL specimens only), is one component of a comprehensive MRSA colonization surveillance program. It is not intended to diagnose MRSA infection nor to guide or monitor treatment for MRSA infections. Performed at [facility], [address] [place], [place] [address]      Radiology Studies: [facility] Chest [place]ou 1 View  Result Date: 08/12/2020 CLINICAL DATA:  Possible sepsis EXAM: PORTABLE CHEST 1 VIEW COMPARISON:  07/19/2020 FINDINGS: Heart size within normal limits. No pulmonary vascular congestion. Right chest port unchanged in position. Para-mediastinal pulmonary fibrotic changes better seen on prior CT exam. No  focal airspace opacity to indicate pneumonia. IMPRESSION: No acute cardiopulmonary process. Electronically Signed   By: Miachel Roux M.D.   On: 08/12/2020 14:02    Scheduled Meds: . amiodarone  200 mg Oral Daily  . amitriptyline  25 mg Oral QHS  . apixaban  2.5 mg Oral BID  . atorvastatin  10 mg Oral QHS  . Chlorhexidine Gluconate Cloth  6 each Topical Daily  . ivabradine  5 mg Oral BID WC  . levothyroxine  75 mcg Oral QAC breakfast  . midodrine  10 mg Oral TID WC  . ondansetron (ZOFRAN) IV  4 mg Intravenous Once  . pantoprazole  20 mg Oral Daily   Continuous Infusions: . cefTRIAXone (ROCEPHIN)  IV       LOS: 0 days   Time spent: 40 minutes More than 50% of the time was spent in counseling/coordination of care  Lorella Nimrod, MD [facility]  If 7PM-7AM, please contact night-coverage [email or web address]  08/13/2020, 12:59 PM   This record has been created using Systems analyst. Errors have been sought and corrected,but may not always be located. Such creation errors do not reflect on the standard of care.

## 2020-08-14 ENCOUNTER — Inpatient Hospital Stay: Payer: PPO

## 2020-08-14 DIAGNOSIS — A419 Sepsis, unspecified organism: Secondary | ICD-10-CM | POA: Diagnosis not present

## 2020-08-14 DIAGNOSIS — I5022 Chronic systolic (congestive) heart failure: Secondary | ICD-10-CM

## 2020-08-14 DIAGNOSIS — I959 Hypotension, unspecified: Secondary | ICD-10-CM | POA: Diagnosis not present

## 2020-08-14 LAB — CBC
HCT: 27.5 % — ABNORMAL LOW (ref 36.0–46.0)
Hemoglobin: 8.9 g/dL — ABNORMAL LOW (ref 12.0–15.0)
MCH: 27.8 pg (ref 26.0–34.0)
MCHC: 32.4 g/dL (ref 30.0–36.0)
MCV: 85.9 fL (ref 80.0–100.0)
Platelets: 214 10*3/uL (ref 150–400)
RBC: 3.2 MIL/uL — ABNORMAL LOW (ref 3.87–5.11)
RDW: 19.5 % — ABNORMAL HIGH (ref 11.5–15.5)
WBC: 14.3 10*3/uL — ABNORMAL HIGH (ref 4.0–10.5)
nRBC: 0 % (ref 0.0–0.2)

## 2020-08-14 LAB — BASIC METABOLIC PANEL
Anion gap: 7 (ref 5–15)
BUN: 31 mg/dL — ABNORMAL HIGH (ref 8–23)
CO2: 23 mmol/L (ref 22–32)
Calcium: 8.6 mg/dL — ABNORMAL LOW (ref 8.9–10.3)
Chloride: 104 mmol/L (ref 98–111)
Creatinine, Ser: 1.65 mg/dL — ABNORMAL HIGH (ref 0.44–1.00)
GFR, Estimated: 33 mL/min — ABNORMAL LOW (ref >60)
Glucose, Bld: 90 mg/dL (ref 70–99)
Potassium: 3.7 mmol/L (ref 3.5–5.1)
Sodium: 134 mmol/L — ABNORMAL LOW (ref 135–145)

## 2020-08-14 LAB — URINE CULTURE: Culture: >=100000 — AB

## 2020-08-14 NOTE — Plan of Care (Signed)
Patient continues to improve. BP trending higher today, consistently meeting MAP goal of 55 since this am. Ambulating in hallway, up in chair for meals, fair appetite, afebrile. Reports feeling better. Peripheral IV discontinued as port is accessed, working well and only receiving IV antibiotics q 24 hours. Visited by sister in law for much of day.   Problem: Education: Goal: Knowledge of General Education information will improve Description: Including pain rating scale, medication(s)/side effects and non-pharmacologic comfort measures Outcome: Progressing   Problem: Health Behavior/Discharge Planning: Goal: Ability to manage health-related needs will improve Outcome: Progressing   Problem: Clinical Measurements: Goal: Ability to maintain clinical measurements within normal limits will improve Outcome: Progressing Goal: Will remain free from infection Outcome: Progressing Goal: Diagnostic test results will improve Outcome: Progressing Goal: Respiratory complications will improve Outcome: Progressing Goal: Cardiovascular complication will be avoided Outcome: Progressing   Problem: Activity: Goal: Risk for activity intolerance will decrease Outcome: Progressing   Problem: Nutrition: Goal: Adequate nutrition will be maintained Outcome: Progressing   Problem: Coping: Goal: Level of anxiety will decrease Outcome: Progressing   Problem: Elimination: Goal: Will not experience complications related to bowel motility Outcome: Progressing Goal: Will not experience complications related to urinary retention Outcome: Progressing   Problem: Pain Managment: Goal: General experience of comfort will improve Outcome: Progressing   Problem: Safety: Goal: Ability to remain free from injury will improve Outcome: Progressing   Problem: Skin Integrity: Goal: Risk for impaired skin integrity will decrease Outcome: Progressing

## 2020-08-14 NOTE — Progress Notes (Addendum)
Progress Note    Jaime Hall  VWU:[id number] DOB: 12/31/1950  DOA: 08/12/2020 PCP: [name], MD      Brief Narrative:    Medical records reviewed and are as summarized below:  Jaime Hall is a 70 y.o. female       Assessment/Plan:   Principal Problem:   Sepsis associated hypotension ([place]) Active Problems:   Hyperlipidemia   History of Hodgkin's lymphoma   Benign neoplasm of sigmoid colon   PAD (peripheral artery disease) (HCC)   Endometrial cancer (HCC)   Chemotherapy-induced peripheral neuropathy (HCC)   Back pain   B12 deficiency anemia   Leukocytosis   HTN (hypertension)   HLD (hyperlipidemia)   Depression with anxiety   Hypothyroidism   PE (pulmonary thromboembolism) (HCC)   GERD (gastroesophageal reflux disease)   UTI (urinary tract infection)   Chronic systolic CHF (congestive heart failure) (HCC)   Cardiomyopathy ([place])   Hypotension   Sepsis ([place])   Nutrition Problem: Increased nutrient needs Etiology: cancer and cancer related treatments  Signs/Symptoms: estimated needs   Body mass index is 26.58 kg/m.    Severe sepsis secondary to E. Coli UTI complicated by AKI and hypotension: Urine culture showed E. coli.  Follow-up susceptibility report.  No growth on blood cultures thus far.  Continue empiric IV antibiotics.  AKI on CKD stage IIIa: Creatinine is improving.  Continue to monitor.  Chronic systolic CHF with EF less than 20%: Compensated.  History of PE: Continue Eliquis  Paroxysmal SVT: Continue amiodarone and ivabradine.  Chronic hypotension: Continue midodrine  Anemia of chronic disease: Monitor H&H.  Stage IV endometrial cancer: She said she is no longer on chemotherapy.  She is being followed by palliative care team but she said she is not on hospice.  Other comorbidities include hypothyroidism, anxiety, depression.  Diet Order            Diet 2 gram sodium Room service appropriate? Yes; Fluid  consistency: Thin  Diet effective now                    Consultants:  None  Procedures:  None    Medications:   . amiodarone  200 mg Oral Daily  . amitriptyline  25 mg Oral QHS  . apixaban  2.5 mg Oral BID  . atorvastatin  10 mg Oral QHS  . Chlorhexidine Gluconate Cloth  6 each Topical Daily  . feeding supplement  237 mL Oral BID BM  . ivabradine  5 mg Oral BID WC  . levothyroxine  75 mcg Oral QAC breakfast  . midodrine  10 mg Oral TID WC  . multivitamin with minerals  1 tablet Oral Daily  . ondansetron (ZOFRAN) IV  4 mg Intravenous Once  . pantoprazole  20 mg Oral Daily   Continuous Infusions: . cefTRIAXone (ROCEPHIN)  IV 1 g (08/14/20 1322)     Anti-infectives (From admission, onward)   Start     Dose/Rate Route Frequency Ordered Stop   08/13/20 1400  cefTRIAXone (ROCEPHIN) 1 g in sodium chloride 0.9 % 100 mL IVPB        1 g 200 mL/hr over 30 Minutes Intravenous Every 24 hours 08/12/20 1546 08/17/20 1359   08/12/20 1345  cefTRIAXone (ROCEPHIN) 1 g in sodium chloride 0.9 % 100 mL IVPB  Status:  Discontinued        1 g 200 mL/hr over 30 Minutes Intravenous Every 24 hours 08/12/20 1334 08/12/20 1546  Family Communication/Anticipated D/C date and plan/Code Status   DVT prophylaxis: apixaban (ELIQUIS) tablet 2.5 mg Start: 08/12/20 2200 Place TED hose Start: 08/12/20 1546 apixaban (ELIQUIS) tablet 2.5 mg     Code Status: DNR  Family Communication: Discussed with her sister-in-law, Diane, at the bedside Disposition Plan:    Status is: Inpatient  Remains inpatient appropriate because:IV treatments appropriate due to intensity of illness or inability to take PO   Dispo: The patient is from: Home              Anticipated d/c is to: Home              Patient currently is not medically stable to d/c.   Difficult to place patient No           Subjective:   Interval events noted.  She has no complaints.  She said her blood  pressure normally runs low.  Objective:    Vitals:   08/14/20 1300 08/14/20 1400 08/14/20 1500 08/14/20 1600  BP: (!) 110/51 (!) 113/55 (!) 104/48 (!) 106/46  Pulse:      Resp: (!) 24 (!) 21 15 (!) 23  Temp:    97.6 F (36.4 C)  TempSrc:    Axillary  SpO2:  98%  99%  Weight:      Height:       No data found.   Intake/Output Summary (Last 24 hours) at 08/14/2020 1716 Last data filed at 08/14/2020 1234 Gross per 24 hour  Intake 400 ml  Output --  Net 400 ml   Filed Weights   08/12/20 1244 08/12/20 2137  Weight: 63.5 kg 63.8 kg    Exam:  GEN: NAD SKIN: No rash EYES: EOMI ENT: MMM CV: RRR PULM: CTA B ABD: soft, ND, NT, +BS CNS: AAO x 3, non focal EXT: No edema or tenderness        Data Reviewed:   I have personally reviewed following labs and imaging studies:  Labs: Labs show the following:   Basic Metabolic Panel: Recent Labs  Lab 08/12/20 1244 08/14/20 0525  NA 132* 134*  K 4.1 3.7  CL 100 104  CO2 19* 23  GLUCOSE 130* 90  BUN 29* 31*  CREATININE 2.54* 1.65*  CALCIUM 9.3 8.6*   GFR Estimated Creatinine Clearance: 27.1 mL/min (A) (by C-G formula based on SCr of 1.65 mg/dL (H)). Liver Function Tests: No results for input(s): AST, ALT, ALKPHOS, BILITOT, PROT, ALBUMIN in the last 168 hours. No results for input(s): LIPASE, AMYLASE in the last 168 hours. No results for input(s): AMMONIA in the last 168 hours. Coagulation profile Recent Labs  Lab 08/13/20 0729  INR 1.7*    CBC: Recent Labs  Lab 08/12/20 1244 08/14/20 0525  WBC 17.1* 14.3*  HGB 11.7* 8.9*  HCT 36.5 27.5*  MCV 86.9 85.9  PLT 236 214   Cardiac Enzymes: No results for input(s): CKTOTAL, CKMB, CKMBINDEX, TROPONINI in the last 168 hours. BNP (last 3 results) No results for input(s): PROBNP in the last 8760 hours. CBG: Recent Labs  Lab 08/12/20 2128  GLUCAP 110*   D-Dimer: No results for input(s): DDIMER in the last 72 hours. Hgb A1c: No results for input(s):  HGBA1C in the last 72 hours. Lipid Profile: No results for input(s): CHOL, HDL, LDLCALC, TRIG, CHOLHDL, LDLDIRECT in the last 72 hours. Thyroid function studies: No results for input(s): TSH, T4TOTAL, T3FREE, THYROIDAB in the last 72 hours.  Invalid input(s): FREET3 Anemia work up: No results  for input(s): VITAMINB12, FOLATE, FERRITIN, TIBC, IRON, RETICCTPCT in the last 72 hours. Sepsis Labs: Recent Labs  Lab 08/12/20 1244 08/12/20 1546 08/12/20 1620 08/13/20 0729 08/14/20 0525  PROCALCITON  --  1.40  --   --   --   WBC 17.1*  --   --   --  14.3*  LATICACIDVEN  --   --  1.9 1.4  --     Microbiology Recent Results (from the past 240 hour(s))  Urine Culture     Status: Abnormal   Collection Time: 08/12/20 10:52 AM   Specimen: Urine, Random  Result Value Ref Range Status   Specimen Description URINE, RANDOM  Final   Special Requests   Final    NONE Performed at [facility], [address] [place], [place] [address]    Culture >=100,000 COLONIES/mL ESCHERICHIA COLI (A)  Final   Report Status 08/14/2020 FINAL  Final   Organism ID, Bacteria ESCHERICHIA COLI (A)  Final      Susceptibility   Escherichia coli - MIC*    AMPICILLIN >=32 RESISTANT Resistant     CEFAZOLIN <=4 SENSITIVE Sensitive     CEFEPIME <=0.12 SENSITIVE Sensitive     CEFTRIAXONE <=0.25 SENSITIVE Sensitive     CIPROFLOXACIN <=0.25 SENSITIVE Sensitive     GENTAMICIN <=1 SENSITIVE Sensitive     IMIPENEM <=0.25 SENSITIVE Sensitive     NITROFURANTOIN <=16 SENSITIVE Sensitive     TRIMETH/SULFA <=20 SENSITIVE Sensitive     AMPICILLIN/SULBACTAM 16 INTERMEDIATE Intermediate     PIP/TAZO <=4 SENSITIVE Sensitive     * >=100,000 COLONIES/mL ESCHERICHIA COLI  Blood Culture (routine x 2)     Status: None (Preliminary result)   Collection Time: 08/12/20  2:00 PM   Specimen: BLOOD  Result Value Ref Range Status   Specimen Description BLOOD LEFT ANTECUBITAL  Final   Special Requests   Final    BOTTLES DRAWN  AEROBIC AND ANAEROBIC Blood Culture results may not be optimal due to an inadequate volume of blood received in culture bottles   Culture   Final    NO GROWTH 2 DAYS Performed at [facility], [address] [place], [place] [address]    Report Status PENDING  Incomplete  SARS CORONAVIRUS 2 (TAT 6-24 HRS) Nasopharyngeal Nasopharyngeal Swab     Status: None   Collection Time: 08/12/20  3:14 PM   Specimen: Nasopharyngeal Swab  Result Value Ref Range Status   SARS Coronavirus 2 NEGATIVE NEGATIVE Final    Comment: (NOTE) SARS-CoV-2 target nucleic acids are NOT DETECTED.  The SARS-CoV-2 RNA is generally detectable in upper and lower respiratory specimens during the acute phase of infection. Negative results do not preclude SARS-CoV-2 infection, do not rule out co-infections with other pathogens, and should not be used as the sole basis for treatment or other patient management decisions. Negative results must be combined with clinical observations, patient history, and epidemiological information. The expected result is Negative.  Fact Sheet for Patients: SugarRoll.be  Fact Sheet for Healthcare Providers: https://www.woods-mathews.com/  This test is not yet approved or cleared by the [place] FDA and  has been authorized for detection and/or diagnosis of SARS-CoV-2 by FDA under an Emergency Use Authorization (EUA). This EUA will remain  in effect (meaning this test can be used) for the duration of the COVID-19 declaration under Se ction 564(b)(1) of the Act, 21 U.S.C. section 360bbb-3(b)(1), unless the authorization is terminated or revoked sooner.  Performed at [facility],  [address] [place], [place] [address]   Blood Culture (routine x 2)     Status: None (Preliminary result)   Collection Time: 08/12/20  3:33 PM   Specimen: BLOOD  Result Value Ref Range Status   Specimen Description BLOOD BLOOD RIGHT HAND   Final   Special Requests   Final    BOTTLES DRAWN AEROBIC ONLY Blood Culture results may not be optimal due to an inadequate volume of blood received in culture bottles   Culture   Final    NO GROWTH 2 DAYS Performed at [facility], [address], [place], [place] [address]    Report Status PENDING  Incomplete  Urine culture     Status: Abnormal (Preliminary result)   Collection Time: 08/12/20  4:20 PM   Specimen: Urine, Random  Result Value Ref Range Status   Specimen Description   Final    URINE, RANDOM Performed at [facility], [address], [place], [place] [address]    Special Requests   Final    NONE Performed at [facility], [address], [place], [place] [address]    Culture (A)  Final    >=100,000 COLONIES/mL ESCHERICHIA COLI SUSCEPTIBILITIES TO FOLLOW Performed at [facility], [address], [place], [place] [address]    Report Status PENDING  Incomplete  MRSA PCR Screening     Status: None   Collection Time: 08/12/20  9:32 PM   Specimen: Nasal Mucosa; Nasopharyngeal  Result Value Ref Range Status   MRSA by PCR NEGATIVE NEGATIVE Final    Comment:        The GeneXpert MRSA Assay (FDA approved for NASAL specimens only), is one component of a comprehensive MRSA colonization surveillance program. It is not intended to diagnose MRSA infection nor to guide or monitor treatment for MRSA infections. Performed at [facility], [address] [place],  [address]     Procedures and diagnostic studies:  No results found.             LOS: 1 day   Jeancarlo Leffler  [facility]   Pager on [email or web address]. If 7PM-7AM, please contact night-coverage at [email or web address]     08/14/2020, 5:16 PM

## 2020-08-15 DIAGNOSIS — A4151 Sepsis due to Escherichia coli [E. coli]: Secondary | ICD-10-CM | POA: Diagnosis not present

## 2020-08-15 DIAGNOSIS — N3000 Acute cystitis without hematuria: Secondary | ICD-10-CM

## 2020-08-15 DIAGNOSIS — C541 Malignant neoplasm of endometrium: Secondary | ICD-10-CM

## 2020-08-15 DIAGNOSIS — I5022 Chronic systolic (congestive) heart failure: Secondary | ICD-10-CM | POA: Diagnosis not present

## 2020-08-15 LAB — CBC WITH DIFFERENTIAL/PLATELET
Abs Immature Granulocytes: 0.14 10*3/uL — ABNORMAL HIGH (ref 0.00–0.07)
Basophils Absolute: 0.1 10*3/uL (ref 0.0–0.1)
Basophils Relative: 1 %
Eosinophils Absolute: 0.3 10*3/uL (ref 0.0–0.5)
Eosinophils Relative: 2 %
HCT: 29 % — ABNORMAL LOW (ref 36.0–46.0)
Hemoglobin: 9 g/dL — ABNORMAL LOW (ref 12.0–15.0)
Immature Granulocytes: 1 %
Lymphocytes Relative: 5 %
Lymphs Abs: 0.8 10*3/uL (ref 0.7–4.0)
MCH: 26.9 pg (ref 26.0–34.0)
MCHC: 31 g/dL (ref 30.0–36.0)
MCV: 86.8 fL (ref 80.0–100.0)
Monocytes Absolute: 0.6 10*3/uL (ref 0.1–1.0)
Monocytes Relative: 4 %
Neutro Abs: 15.4 10*3/uL — ABNORMAL HIGH (ref 1.7–7.7)
Neutrophils Relative %: 87 %
Platelets: 212 10*3/uL (ref 150–400)
RBC: 3.34 MIL/uL — ABNORMAL LOW (ref 3.87–5.11)
RDW: 19.7 % — ABNORMAL HIGH (ref 11.5–15.5)
WBC: 17.3 10*3/uL — ABNORMAL HIGH (ref 4.0–10.5)
nRBC: 0 % (ref 0.0–0.2)

## 2020-08-15 LAB — URINE CULTURE: Culture: >=100000 — AB

## 2020-08-15 LAB — BASIC METABOLIC PANEL
Anion gap: 7 (ref 5–15)
BUN: 27 mg/dL — ABNORMAL HIGH (ref 8–23)
CO2: 23 mmol/L (ref 22–32)
Calcium: 8.6 mg/dL — ABNORMAL LOW (ref 8.9–10.3)
Chloride: 105 mmol/L (ref 98–111)
Creatinine, Ser: 1.37 mg/dL — ABNORMAL HIGH (ref 0.44–1.00)
GFR, Estimated: 42 mL/min — ABNORMAL LOW (ref >60)
Glucose, Bld: 92 mg/dL (ref 70–99)
Potassium: 4 mmol/L (ref 3.5–5.1)
Sodium: 135 mmol/L (ref 135–145)

## 2020-08-15 MED ORDER — HEPARIN SOD (PORK) LOCK FLUSH 100 UNIT/ML IV SOLN
500.0000 [IU] | Freq: Once | INTRAVENOUS | Status: AC
  Administered 2020-08-15: 500 [IU] via INTRAVENOUS
  Filled 2020-08-15: qty 5

## 2020-08-15 MED ORDER — CEPHALEXIN 500 MG PO CAPS: 500.0000 mg | ORAL_CAPSULE | Freq: Three times a day (TID) | ORAL | 0 refills | Status: AC

## 2020-08-15 NOTE — Progress Notes (Signed)
Pt continues to show improvement. MAP greater than 55. Pt ambulated in room with stand-by assistance. Pt remains afebrile. No complaints of pain.

## 2020-08-15 NOTE — Progress Notes (Signed)
Right chest port de accessed per policy and procedure.  VSS prior to discharge. Patient denies pain at this time. Discharge instructions discussed and patient verbalized understanding.  Patient taken to medical mall via wheel chair to be taken home by friend via personal vehicle.

## 2020-08-15 NOTE — Discharge Summary (Addendum)
Physician Discharge Summary  Jaime Hall ZOX:[id number] DOB: Jun 17, 1950 DOA: 08/12/2020  PCP: [name], MD  Admit date: 08/12/2020 Discharge date: 08/15/2020  Discharge disposition: Home   Recommendations for Outpatient Follow-Up:   Follow-up with PCP in 1 week   Discharge Diagnosis:   Principal Problem:   Sepsis ([place]) Active Problems:   Hyperlipidemia   History of Hodgkin's lymphoma   Benign neoplasm of sigmoid colon   PAD (peripheral artery disease) ([place])   Endometrial cancer ([place])   Chemotherapy-induced peripheral neuropathy ([facility])   Back pain   B12 deficiency anemia   Leukocytosis   HTN (hypertension)   HLD (hyperlipidemia)   Depression with anxiety   Hypothyroidism   PE (pulmonary thromboembolism) ([facility])   GERD (gastroesophageal reflux disease)   UTI (urinary tract infection)   Chronic systolic CHF (congestive heart failure) ([place])   Cardiomyopathy ([place])   Hypotension    Discharge Condition: Stable.  Diet recommendation:  Diet Order            Diet general           Diet 2 gram sodium Room service appropriate? Yes; Fluid consistency: Thin  Diet effective now                   Code Status: DNR     Hospital Course:   Jaime Hall a 70 y.o.femalewith medical history significant forheart failure with reduced ejection fraction, EF of less than 20%, echo 07/22/2020, endometrial cancer, status post chemoradiation, now on palliative care, CKD 3B, chronic hypertension, chemotherapy-induced peripheral neuropathy, B12 deficiency, NSTEMI, dilated cardiomyopathy, gastroenteritis, history of pulmonary embolism on Eliquis 2.5 mg twice daily, hypothyroid, anxiety, chronic pain, GERD.  She was brought to the hospital because of fever and right flank pain.  She was admitted to the hospital for sepsis secondary to UTI.  She was treated with empiric IV antibiotics and IV fluids.  Initially, there was concern for septic shock because of  persistent hypotension so she was transferred to stepdown unit for close monitoring.  However, she did not require any IV vasopressors.  Instead she was treated with midodrine.  Patient said she has chronic low blood pressure and she does not have any symptoms from her low blood pressure.  Septic shock was ruled out.  Her condition has improved significantly.  She feels better and she has been able to ambulate without any problems.  She is deemed stable for discharge to home today.  Discharge plan discussed with the patient and her sister-in-law, Diane.     Discharge Exam:    Vitals:   08/15/20 0500 08/15/20 0600 08/15/20 0700 08/15/20 0800  BP: (!) 94/48 (!) 92/39 (!) 114/52 (!) 114/56  Pulse:      Resp: 20 (!) 23 15 (!) 21  Temp:    98.6 F (37 C)  TempSrc:    Oral  SpO2: 97%  96% 96%  Weight:      Height:         GEN: NAD SKIN: No rash. Port-a-cath on right upper chest EYES: EOMI ENT: MMM CV: RRR PULM: CTA B ABD: soft, ND, NT, +BS CNS: AAO x 3, non focal EXT: No edema or tenderness   The results of significant diagnostics from this hospitalization (including imaging, microbiology, ancillary and laboratory) are listed below for reference.     Procedures and Diagnostic Studies:   DG Chest Port 1 View  Result Date: 08/12/2020 CLINICAL DATA:  Possible sepsis EXAM: PORTABLE  CHEST 1 VIEW COMPARISON:  07/19/2020 FINDINGS: Heart size within normal limits. No pulmonary vascular congestion. Right chest port unchanged in position. Para-mediastinal pulmonary fibrotic changes better seen on prior CT exam. No focal airspace opacity to indicate pneumonia. IMPRESSION: No acute cardiopulmonary process. Electronically Signed   By: Miachel Roux M.D.   On: 08/12/2020 14:02     Labs:   Basic Metabolic Panel: Recent Labs  Lab 08/12/20 1244 08/14/20 0525 08/15/20 0426  NA 132* 134* 135  K 4.1 3.7 4.0  CL 100 104 105  CO2 19* 23 23  GLUCOSE 130* 90 92  BUN 29* 31* 27*  CREATININE  2.54* 1.65* 1.37*  CALCIUM 9.3 8.6* 8.6*   GFR Estimated Creatinine Clearance: 32.7 mL/min (A) (by C-G formula based on SCr of 1.37 mg/dL (H)). Liver Function Tests: No results for input(s): AST, ALT, ALKPHOS, BILITOT, PROT, ALBUMIN in the last 168 hours. No results for input(s): LIPASE, AMYLASE in the last 168 hours. No results for input(s): AMMONIA in the last 168 hours. Coagulation profile Recent Labs  Lab 08/13/20 0729  INR 1.7*    CBC: Recent Labs  Lab 08/12/20 1244 08/14/20 0525 08/15/20 0426  WBC 17.1* 14.3* 17.3*  NEUTROABS  --   --  15.4*  HGB 11.7* 8.9* 9.0*  HCT 36.5 27.5* 29.0*  MCV 86.9 85.9 86.8  PLT 236 214 212   Cardiac Enzymes: No results for input(s): CKTOTAL, CKMB, CKMBINDEX, TROPONINI in the last 168 hours. BNP: Invalid input(s): POCBNP CBG: Recent Labs  Lab 08/12/20 2128  GLUCAP 110*   D-Dimer No results for input(s): DDIMER in the last 72 hours. Hgb A1c No results for input(s): HGBA1C in the last 72 hours. Lipid Profile No results for input(s): CHOL, HDL, LDLCALC, TRIG, CHOLHDL, LDLDIRECT in the last 72 hours. Thyroid function studies No results for input(s): TSH, T4TOTAL, T3FREE, THYROIDAB in the last 72 hours.  Invalid input(s): FREET3 Anemia work up No results for input(s): VITAMINB12, FOLATE, FERRITIN, TIBC, IRON, RETICCTPCT in the last 72 hours. Microbiology Recent Results (from the past 240 hour(s))  Urine Culture     Status: Abnormal   Collection Time: 08/12/20 10:52 AM   Specimen: Urine, Random  Result Value Ref Range Status   Specimen Description URINE, RANDOM  Final   Special Requests   Final    NONE Performed at [facility], [address] [place], [place] [address]    Culture >=100,000 COLONIES/mL ESCHERICHIA COLI (A)  Final   Report Status 08/14/2020 FINAL  Final   Organism ID, Bacteria ESCHERICHIA COLI (A)  Final      Susceptibility   Escherichia coli - MIC*    AMPICILLIN >=32 RESISTANT Resistant      CEFAZOLIN <=4 SENSITIVE Sensitive     CEFEPIME <=0.12 SENSITIVE Sensitive     CEFTRIAXONE <=0.25 SENSITIVE Sensitive     CIPROFLOXACIN <=0.25 SENSITIVE Sensitive     GENTAMICIN <=1 SENSITIVE Sensitive     IMIPENEM <=0.25 SENSITIVE Sensitive     NITROFURANTOIN <=16 SENSITIVE Sensitive     TRIMETH/SULFA <=20 SENSITIVE Sensitive     AMPICILLIN/SULBACTAM 16 INTERMEDIATE Intermediate     PIP/TAZO <=4 SENSITIVE Sensitive     * >=100,000 COLONIES/mL ESCHERICHIA COLI  Blood Culture (routine x 2)     Status: None (Preliminary result)   Collection Time: 08/12/20  2:00 PM   Specimen: BLOOD  Result Value Ref Range Status   Specimen Description BLOOD LEFT ANTECUBITAL  Final   Special Requests   Final  BOTTLES DRAWN AEROBIC AND ANAEROBIC Blood Culture results may not be optimal due to an inadequate volume of blood received in culture bottles   Culture   Final    NO GROWTH 3 DAYS Performed at [facility], [address] [place], [place] [address]    Report Status PENDING  Incomplete  SARS CORONAVIRUS 2 (TAT 6-24 HRS) Nasopharyngeal Nasopharyngeal Swab     Status: None   Collection Time: 08/12/20  3:14 PM   Specimen: Nasopharyngeal Swab  Result Value Ref Range Status   SARS Coronavirus 2 NEGATIVE NEGATIVE Final    Comment: (NOTE) SARS-CoV-2 target nucleic acids are NOT DETECTED.  The SARS-CoV-2 RNA is generally detectable in upper and lower respiratory specimens during the acute phase of infection. Negative results do not preclude SARS-CoV-2 infection, do not rule out co-infections with other pathogens, and should not be used as the sole basis for treatment or other patient management decisions. Negative results must be combined with clinical observations, patient history, and epidemiological information. The expected result is Negative.  Fact Sheet for Patients: SugarRoll.be  Fact Sheet for Healthcare  Providers: https://www.woods-mathews.com/  This test is not yet approved or cleared by the [place] FDA and  has been authorized for detection and/or diagnosis of SARS-CoV-2 by FDA under an Emergency Use Authorization (EUA). This EUA will remain  in effect (meaning this test can be used) for the duration of the COVID-19 declaration under Se ction 564(b)(1) of the Act, 21 U.S.C. section 360bbb-3(b)(1), unless the authorization is terminated or revoked sooner.  Performed at [facility], [place], [place], [place] [address]   Blood Culture (routine x 2)     Status: None (Preliminary result)   Collection Time: 08/12/20  3:33 PM   Specimen: BLOOD  Result Value Ref Range Status   Specimen Description BLOOD BLOOD RIGHT HAND  Final   Special Requests   Final    BOTTLES DRAWN AEROBIC ONLY Blood Culture results may not be optimal due to an inadequate volume of blood received in culture bottles   Culture   Final    NO GROWTH 3 DAYS Performed at [facility], [address] [place], [place] [address]    Report Status PENDING  Incomplete  Urine culture     Status: Abnormal   Collection Time: 08/12/20  4:20 PM   Specimen: Urine, Random  Result Value Ref Range Status   Specimen Description   Final    URINE, RANDOM Performed at [facility], [address], [place], [place] [address]    Special Requests   Final    NONE Performed at [facility], [address] [place],  [address]    Culture >=100,000 COLONIES/mL ESCHERICHIA COLI (A)  Final   Report Status 08/15/2020 FINAL  Final   Organism ID, Bacteria ESCHERICHIA COLI (A)  Final      Susceptibility   Escherichia coli - MIC*    AMPICILLIN >=32 RESISTANT Resistant     CEFAZOLIN <=4 SENSITIVE Sensitive     CEFEPIME <=0.12 SENSITIVE Sensitive     CEFTRIAXONE <=0.25 SENSITIVE Sensitive     CIPROFLOXACIN <=0.25 SENSITIVE Sensitive     GENTAMICIN <=1 SENSITIVE Sensitive      IMIPENEM <=0.25 SENSITIVE Sensitive     NITROFURANTOIN <=16 SENSITIVE Sensitive     TRIMETH/SULFA <=20 SENSITIVE Sensitive     AMPICILLIN/SULBACTAM 16 INTERMEDIATE Intermediate     PIP/TAZO <=4 SENSITIVE Sensitive     * >=100,000 COLONIES/mL ESCHERICHIA  COLI  MRSA PCR Screening     Status: None   Collection Time: 08/12/20  9:32 PM   Specimen: Nasal Mucosa; Nasopharyngeal  Result Value Ref Range Status   MRSA by PCR NEGATIVE NEGATIVE Final    Comment:        The GeneXpert MRSA Assay (FDA approved for NASAL specimens only), is one component of a comprehensive MRSA colonization surveillance program. It is not intended to diagnose MRSA infection nor to guide or monitor treatment for MRSA infections. Performed at [facility], [address], [place], [place] [address]      Discharge Instructions:   Discharge Instructions    Diet general   Complete by: As directed    Increase activity slowly   Complete by: As directed      Allergies as of 08/15/2020      Reactions   Metoprolol Other (See Comments)   Fatigue   Adhesive [tape] Other (See Comments)   Skin Irritation   Antifungal [miconazole Nitrate] Rash   Sulfa Antibiotics Nausea And Vomiting, Rash   Z-pak [azithromycin] Itching      Medication List    STOP taking these medications   HYDROcodone-homatropine 5-1.5 MG/5ML syrup Commonly known as: HYCODAN     TAKE these medications   amiodarone 200 MG tablet Commonly known as: PACERONE Take 1 tablet (200 mg total) by mouth daily.   amitriptyline 25 MG tablet Commonly known as: ELAVIL Take 1 tablet (25 mg total) by mouth at bedtime. May increase weekly as needed   apixaban 2.5 MG Tabs tablet Commonly known as: Eliquis Take 1 tablet (2.5 mg total) by mouth 2 (two) times daily.   atorvastatin 10 MG tablet Commonly known as: LIPITOR Take 1 tablet (10 mg total) by mouth daily.   cephALEXin 500 MG capsule Commonly known as: KEFLEX Take 1 capsule (500  mg total) by mouth 3 (three) times daily for 4 days.   cholecalciferol 25 MCG (1000 UNIT) tablet Commonly known as: VITAMIN D3 Take 1,000 Units by mouth in the morning and at bedtime.   cyanocobalamin 1000 MCG/ML injection Commonly known as: (VITAMIN B-12) Inject 1 mL (1,000 mcg total) into the muscle every 14 (fourteen) days.   empagliflozin 10 MG Tabs tablet Commonly known as: JARDIANCE Take 1 tablet (10 mg total) by mouth daily.   furosemide 20 MG tablet Commonly known as: LASIX Take 1 tablet (20 mg total) by mouth as needed for fluid or edema.   ivabradine 5 MG Tabs tablet Commonly known as: CORLANOR Take 1 tablet (5 mg total) by mouth 2 (two) times daily with a meal.   levothyroxine 75 MCG tablet Commonly known as: Synthroid Take 1 tablet (75 mcg total) by mouth daily before breakfast.   LORazepam 0.5 MG tablet Commonly known as: ATIVAN Take 1 tablet (0.5 mg total) by mouth every 6 (six) hours as needed for anxiety (and to help with breathing).   morphine CONCENTRATE 10 mg / 0.5 ml concentrated solution Take 0.25 mLs (5 mg total) by mouth every 6 (six) hours as needed for severe pain or shortness of breath.   oxyCODONE 5 MG immediate release tablet Commonly known as: Oxy IR/ROXICODONE Take 5 mg by mouth every 4 (four) hours as needed for severe pain.   pantoprazole 20 MG tablet Commonly known as: Protonix Take 1 tablet (20 mg total) by mouth daily.   potassium chloride 10 MEQ tablet Commonly known as: KLOR-CON Take 1 tablet (10 mEq total) by mouth daily as needed (  Take on days that you take the Lasix.).         Time coordinating discharge: 32 minutes  Signed:  Tenishia Ekman  [facility] 08/15/2020, 4:24 PM   Pager on [email or web address]. If 7PM-7AM, please contact night-coverage at [email or web address]

## 2020-08-17 LAB — CULTURE, BLOOD (ROUTINE X 2)
Culture: NO GROWTH
Culture: NO GROWTH

## 2020-08-19 ENCOUNTER — Telehealth: Payer: Self-pay | Admitting: *Deleted

## 2020-08-19 ENCOUNTER — Encounter: Payer: Self-pay | Admitting: Oncology

## 2020-08-19 NOTE — Telephone Encounter (Signed)
Called and spoke to pt about what was going on. She says she is sore in her abdomen, could not eat much. The food made it hurt worse she said. She had a temp. Of 100 but this am she broke the fever. She has been having night sweats and woke up 3:30 am and her hair, pillow was wet. Still spotting blood and has to use poise to collect bleeding. She states that dr Janese Banks already knew this but it is getting worse. The pain hurts and makes it hard to sleep due to pain. She only has a few pain pills and would like a refill. She had gyn appt3/2 but it was cancelled due to her being in hospital. I told her I will check with lauren in am and kristi to put her back on gyn schedule. She is agreeable to this.

## 2020-08-20 ENCOUNTER — Telehealth: Payer: Self-pay | Admitting: Adult Health Nurse Practitioner

## 2020-08-20 ENCOUNTER — Ambulatory Visit (INDEPENDENT_AMBULATORY_CARE_PROVIDER_SITE_OTHER): Payer: PPO | Admitting: Pharmacist

## 2020-08-20 ENCOUNTER — Telehealth: Payer: Self-pay | Admitting: *Deleted

## 2020-08-20 DIAGNOSIS — G620 Drug-induced polyneuropathy: Secondary | ICD-10-CM

## 2020-08-20 DIAGNOSIS — T451X5A Adverse effect of antineoplastic and immunosuppressive drugs, initial encounter: Secondary | ICD-10-CM

## 2020-08-20 DIAGNOSIS — N1832 Chronic kidney disease, stage 3b: Secondary | ICD-10-CM

## 2020-08-20 DIAGNOSIS — I471 Supraventricular tachycardia: Secondary | ICD-10-CM

## 2020-08-20 DIAGNOSIS — I5022 Chronic systolic (congestive) heart failure: Secondary | ICD-10-CM

## 2020-08-20 DIAGNOSIS — C541 Malignant neoplasm of endometrium: Secondary | ICD-10-CM

## 2020-08-20 NOTE — Telephone Encounter (Signed)
Spoke with patient about rescheduling the Initial Palliative Consult that was scheduled on 08/12/20 (patient was in the hospital at the time of this appointment).  Palliative Consult rescheduled for 08/21/20 @ 9 AM.

## 2020-08-20 NOTE — Chronic Care Management (AMB) (Signed)
Chronic Care Management Pharmacy Note  08/20/2020 Name:  Jaime Hall MRN:  [id number] DOB:  June 15, 1951  Subjective: Jaime Hall is an 70 y.o. year old female who is a primary patient of Derrel Nip, Aris Everts, MD.  The CCM team was consulted for assistance with disease management and care coordination needs.    Engaged with patient by telephone for initial visit in response to provider referral for pharmacy case management and/or care coordination services.   Consent to Services:  The patient was given information about Chronic Care Management services, agreed to services, and gave verbal consent prior to initiation of services.  Please see initial visit note for detailed documentation.   Patient Care Team: [name], MD as PCP - General (Internal Medicine) Wellington Hampshire, MD as PCP - Cardiology (Cardiology) [name], MD as Consulting Physician (General Surgery) Mellody Drown, MD as Referring Physician (Obstetrics and Gynecology) [name], MD as Referring Physician (Obstetrics and Gynecology) Hollice Espy, MD as Consulting Physician (Urology) [name], RN as Registered Nurse Sindy Guadeloupe, MD as Consulting Physician (Oncology) De Hollingshead, RPH-CPP (Pharmacist) Leona Singleton, RN as Case Manager  Recent office visits:  2/21 - RN CM, reconnect w/ palliative care. Discussion of HF management  2/11 - PCP - hospital f/u, Hospice referral made  Recent consult visits:  2/16 - cardiology Christell Faith, PA - dose reduction of amiodarone  Hospital visits:  2/28-3/3 - hospitalized for sepsis d/t UTI; course of cephalexin completed  2/4-2/7 - hospitalized for SVT, NSTEMI related to tachycardia d/t anemia (vaginal bleeding); amiodarone added  Objective:  Lab Results  Component Value Date   CREATININE 1.37 (H) 08/15/2020   BUN 27 (H) 08/15/2020   GFR 39.44 (L) 10/02/2019   GFRNONAA 42 (L) 08/15/2020   GFRAA 33 (L)  03/12/2020   NA 135 08/15/2020   K 4.0 08/15/2020   CALCIUM 8.6 (L) 08/15/2020   CO2 23 08/15/2020    Lab Results  Component Value Date/Time   HGBA1C 6.1 (H) 12/30/2019 04:57 AM   HGBA1C 6.0 07/27/2019 02:29 PM   HGBA1C 14.1 08/08/2018 12:00 AM   GFR 39.44 (L) 10/02/2019 04:50 PM   GFR 47.63 (L) 07/27/2019 02:29 PM   MICROALBUR 2.6 (H) 10/04/2017 11:44 AM   MICROALBUR 0.8 09/23/2012 07:56 AM     Lab Results  Component Value Date   CHOL 114 12/30/2019   HDL 47 12/30/2019   LDLCALC 41 12/30/2019   TRIG 131 12/30/2019   CHOLHDL 2.4 12/30/2019    Hepatic Function Latest Ref Rng & Units 06/10/2020 05/19/2020 05/17/2020  Total Protein 6.5 - 8.1 g/dL 7.0 6.8 6.4(L)  Albumin 3.5 - 5.0 g/dL 3.2(L) 3.3(L) 3.2(L)  AST 15 - 41 U/L '20 22 18  ' ALT 0 - 44 U/L '11 9 10  ' Alk Phosphatase 38 - 126 U/L 96 81 76  Total Bilirubin 0.3 - 1.2 mg/dL 0.5 1.1 0.8  Bilirubin, Direct 0.1 - 0.5 mg/dL - - -    Lab Results  Component Value Date/Time   TSH 2.091 07/19/2020 11:11 PM   TSH 0.637 05/17/2020 08:33 AM   TSH 11.200 (H) 02/20/2020 09:33 AM   TSH 4.20 04/05/2018 04:52 PM   FREET4 0.89 01/16/2020 09:02 AM    CBC Latest Ref Rng & Units 08/15/2020 08/14/2020 08/12/2020  WBC 4.0 - 10.5 K/uL 17.3(H) 14.3(H) 17.1(H)  Hemoglobin 12.0 - 15.0 g/dL 9.0(L) 8.9(L) 11.7(L)  Hematocrit 36.0 - 46.0 % 29.0(L) 27.5(L) 36.5  Platelets 150 - 400 K/uL 212 214 236    Lab Results  Component Value Date/Time   VD25OH 59.00 10/04/2017 11:44 AM   VD25OH 78.88 11/20/2015 11:54 AM    Clinical ASCVD: Yes - PAD/CAD, hx NSTEMI    Depression screen [facility] [place] 2/9 08/05/2020 07/26/2020 06/25/2020  Decreased Interest 0 0 0  Down, Depressed, Hopeless 0 0 1  PHQ - 2 Score 0 0 1  Altered sleeping - - -  Tired, decreased energy - - -  Change in appetite - - -  Feeling bad or failure about yourself  - - -  Trouble concentrating - - -  Moving slowly or fidgety/restless - - -  Suicidal thoughts - - -  PHQ-9 Score - - -   Difficult doing work/chores - - -  Some recent data might be hidden     Social History   Tobacco Use  Smoking Status Former Smoker  . Packs/day: 1.00  . Years: 30.00  . Pack years: 30.00  . Types: Cigarettes  . Quit date: 07/24/2002  . Years since quitting: 18.0  Smokeless Tobacco Never Used  Tobacco Comment   quit smoking in 2000   BP Readings from Last 3 Encounters:  08/15/20 (!) 114/56  08/12/20 (!) 83/51  07/31/20 114/62   Pulse Readings from Last 3 Encounters:  08/15/20 62  08/12/20 97  07/31/20 71   Wt Readings from Last 3 Encounters:  08/12/20 140 lb 10.5 oz (63.8 kg)  07/31/20 139 lb (63 kg)  07/26/20 142 lb 6.4 oz (64.6 kg)    Assessment/Interventions: Review of patient past medical history, allergies, medications, health status, including review of consultants reports, laboratory and other test data, was performed as part of comprehensive evaluation and provision of chronic care management services.   SDOH:  (Social Determinants of Health) assessments and interventions performed: Yes SDOH Interventions   Flowsheet Row Most Recent Value  SDOH Interventions   Financial Strain Interventions Other (Comment)  [manufacturer assistance]      CCM Care Plan  Allergies  Allergen Reactions  . Metoprolol Other (See Comments)    Fatigue  . Adhesive [Tape] Other (See Comments)    Skin Irritation   . Antifungal [Miconazole Nitrate] Rash  . Sulfa Antibiotics Nausea And Vomiting and Rash  . Z-Pak [Azithromycin] Itching    Medications Reviewed Today    Reviewed by De Hollingshead, RPH-CPP (Pharmacist) on 08/20/20 at 0857  Med List Status: <None>  Medication Order Taking? Sig Documenting Provider Last Dose Status Informant  amiodarone (PACERONE) 200 MG tablet [id number] Yes Take 1 tablet (200 mg total) by mouth daily. Rise Mu, PA-C Taking Active Self  amitriptyline (ELAVIL) 25 MG tablet [id number] Yes Take 1 tablet (25 mg total) by mouth at bedtime. May  increase weekly as needed [name], MD Taking Active Self  apixaban (ELIQUIS) 2.5 MG TABS tablet [id number] Yes Take 1 tablet (2.5 mg total) by mouth 2 (two) times daily. Sindy Guadeloupe, MD Taking Active Self  atorvastatin (LIPITOR) 10 MG tablet [id number] Yes Take 1 tablet (10 mg total) by mouth daily. Wellington Hampshire, MD Taking Active Self  cholecalciferol (VITAMIN D3) 25 MCG (1000 UNIT) tablet [id number] Yes Take 1,000 Units by mouth in the morning and at bedtime. [provider] Taking Active Self  cyanocobalamin (,VITAMIN B-12,) 1000 MCG/ML injection [id number] Yes Inject 1 mL (1,000 mcg total) into the muscle every 14 (fourteen) days. [name], MD Taking Active Self  empagliflozin (JARDIANCE) 10  MG TABS tablet [id number] Yes Take 1 tablet (10 mg total) by mouth daily. Loel Dubonnet, NP Taking Active Self  furosemide (LASIX) 20 MG tablet [id number] Yes Take 1 tablet (20 mg total) by mouth as needed for fluid or edema. Wellington Hampshire, MD Taking Active Self  ivabradine (CORLANOR) 5 MG TABS tablet [id number] Yes Take 1 tablet (5 mg total) by mouth 2 (two) times daily with a meal. Loel Dubonnet, NP Taking Active Self  levothyroxine (SYNTHROID) 75 MCG tablet [id number] Yes Take 1 tablet (75 mcg total) by mouth daily before breakfast. Sindy Guadeloupe, MD Taking Active Self  LORazepam (ATIVAN) 0.5 MG tablet [id number] No Take 1 tablet (0.5 mg total) by mouth every 6 (six) hours as needed for anxiety (and to help with breathing).  Patient not taking: Reported on 08/20/2020   Sindy Guadeloupe, MD Not Taking Active Self  Morphine Sulfate (MORPHINE CONCENTRATE) 10 mg / 0.5 ml concentrated solution [id number] No Take 0.25 mLs (5 mg total) by mouth every 6 (six) hours as needed for severe pain or shortness of breath.  Patient not taking: Reported on 08/20/2020   Sindy Guadeloupe, MD Not Taking Active Self           Med Note Autumn Messing Jul 09, 2020  2:25 PM)    oxyCODONE (OXY  IR/ROXICODONE) 5 MG immediate release tablet [id number] No Take 5 mg by mouth every 4 (four) hours as needed for severe pain.  Patient not taking: Reported on 08/20/2020   [provider] Not Taking Active Self  pantoprazole (PROTONIX) 20 MG tablet [id number] Yes Take 1 tablet (20 mg total) by mouth daily. [name], MD Taking Active Self  potassium chloride (KLOR-CON) 10 MEQ tablet [id number] Yes Take 1 tablet (10 mEq total) by mouth daily as needed (Take on days that you take the Lasix.). Wellington Hampshire, MD Taking Active Self       Patient taking differently:      Discontinued 01/04/20 1204   Med List Note Laqueta Due 01/08/20 1616): Lenvima filled through [facility]           Patient Active Problem List   Diagnosis Date Noted  . Sepsis ([place]) 08/13/2020  . Chronic systolic CHF (congestive heart failure) ([place]) 07/20/2020  . SVT (supraventricular tachycardia) ([place]) 07/20/2020  . Cardiomyopathy ([place]) 07/20/2020  . Hypotension 07/20/2020  . NSTEMI (non-ST elevated myocardial infarction) ([place]) 07/20/2020  . Acute on chronic combined systolic and diastolic CHF (congestive heart failure) ([place]) 05/19/2020  . PE (pulmonary thromboembolism) ([place]) 05/19/2020  . GERD (gastroesophageal reflux disease) 05/19/2020  . UTI (urinary tract infection) 05/19/2020  . Hospital discharge follow-up 03/02/2020  . Hemoptysis 02/20/2020  . Acute on chronic congestive heart failure ([place])   . Palliative care encounter   . Acute pulmonary embolism ([place]) 02/14/2020  . Thrombocytopenia ([place]) 02/14/2020  . Hypothyroidism 02/14/2020  . Chest pain 12/29/2019  . Leukocytosis 12/29/2019  . HTN (hypertension) 12/29/2019  . HLD (hyperlipidemia) 12/29/2019  . Elevated troponin 12/29/2019  . Stage 3b chronic kidney disease ([place]) 12/29/2019  . Depression with anxiety 12/29/2019  . Abnormal cardiovascular stress test   . Dyspnea on exertion   . Goals of care, counseling/discussion 10/20/2019  .  Peritoneal carcinomatosis ([place]) 10/20/2019  . Peritoneal metastases ([place]) 10/16/2019  . Right anterior knee pain 07/25/2019  . Educated about COVID-19 virus infection 11/15/2018  . Cramps, muscle, general 04/06/2018  . B12 deficiency anemia 07/24/2017  .  Normocytic anemia 07/18/2017  . Back pain 07/18/2017  . Chemotherapy-induced peripheral neuropathy ([place]) 12/10/2016  . Hyperlipidemia 08/13/2016  . Hx of colonic polyps 08/13/2016  . Benign neoplasm of sigmoid colon 08/13/2016  . Endometrial cancer ([place]) 08/13/2016  . 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 Lymphedema 07/20/2016  . Chronic venous insufficiency 07/20/2016  . PAD (peripheral artery disease) ([place]) 07/20/2016  . Class 1 obesity due to excess calories without serious comorbidity with body mass index (BMI) of 34.0 to 34.9 in adult 05/27/2016  . Leg cramps 05/27/2016  . Bronchiectasis ([place]) 12/19/2015  . Pre-syncope 12/19/2015  . Prolapsed, uterovaginal, incomplete 06/24/2014  . Routine general medical examination at a health care facility 12/30/2012  . Obesity (BMI 30-39.9) 12/30/2012  . Hx of multiple pulmonary nodules 12/28/2012  . Plantar fasciitis of right foot 12/28/2012  . Hip pain, bilateral 09/12/2012  . History of Hodgkin's lymphoma 09/12/2012  . Coronary artery disease   . Chest pain on exertion 02/29/2012    Immunization History  Administered Date(s) Administered  . PFIZER(Purple Top)SARS-COV-2 Vaccination 08/20/2019, 09/12/2019  . Pneumococcal Conjugate-13 04/29/2015  . Pneumococcal Polysaccharide-23 04/02/2020  . Tdap 10/05/2011    Conditions to be addressed/monitored:  Heart Failure, Coronary Artery Disease and metastatic endometrial carcinoma  Care Plan : Medication Management  Updates made by De Hollingshead, RPH-CPP since 08/20/2020 12:00 AM    Problem: CHF, CAD/PAD, metastatic endometrial carcinoma     Long-Range Goal: Disease Management   Start Date:  08/20/2020  This Visit's Progress: On track  Priority: High  Note:   Current Barriers:  . Unable to independently afford treatment regimen . Complex patient with terminal comorbidities  Pharmacist Clinical Goal(s):  Marland Kitchen Over the next 90 days, patient will verbalize ability to afford treatment regimen through collaboration with PharmD and provider.   Interventions: . 1:1 collaboration with [name], MD regarding development and update of comprehensive plan of care as evidenced by provider attestation and co-signature . Inter-disciplinary care team collaboration (see longitudinal plan of care) . Comprehensive medication review performed; medication list updated in electronic medical record  Endometrial Carcinoma: . Progressing; appointment with Dr. Janese Banks later this week. Reports vaginal bleeding. Current symptomatic regimen: oxycodone 5 mg - reports she rarely uses; morphine solution - reports she has never used; lorazepam 0.5 mg PRN - reports she has not used since she started chemotherapy; prochlorperazine 10 mg - reports she hasn't used recently, but still has some at home  . Peripheral neuropathy: amitriptyline 25 mg daily; denies any benefit from this dose on neuropathic pain.  . Recurrent UTIs: patient has started taking cranberry supplements. Plans to discuss this with Dr. Janese Banks at her upcoming appointment, but wanted to make sure there were no drug interactions. None noted.  . She plans to discuss DNR form with Dr. Janese Banks at that appointment . Palliative care referral in place, appt later this month.  . Continue collaboration with oncology, palliative care. Consider dose increase of amitriptyline for better neuropathic pain control  CHF (last EF 20%); SVT: . Uncontrolled; current treatment: Jardiance 10 mg daily, Corlanor 5 mg BID, amiodarone 200 mg daily; has furosemide 20 mg + potassium 10 mEq daily but has not needed much recently; follows w/ Dr. [name];  . Cardiology assisted with  patient assistance for Corlanor. Patient reports supply is set to arrive tomorrow in the mail.  . Discussed status of patient assistance for Jardiance. Darlyne Russian, RN from cardiology mailed patient the application. Given  recent hospitalization, she has not had a chance to return this paperwork yet.  . Collaborated w/ Darlyne Russian. Patient is going to find paperwork, sign, and bring with her to oncology appt on Thursday. Will collaborate w/ Dr. Elroy Channel office to fax/secure email this paperwork back to Darlyne Russian for completion and submission. . Continue collaboration with cardiology along with current regimen at this time  Hyperlipidemia, secondary ASCVD prevention (PAD/CAD): . Controlled; current treatment: atorvastatin 20 mg daily  . Antiplatelet regimen: none at this time due to concurrent anticoagulant therapy . Recommend to continue current regimen at this time  Prevention of VTE: . Appropriately managed; current regimen: Eliquis 2.5 mg BID  . Discussed status of patient assistance for Eliquis. Darlyne Russian, RN from cardiology mailed patient the application. Given recent hospitalization, she has not had a chance to return this paperwork yet.  . Collaborated w/ Darlyne Russian. Patient is going to find paperwork, sign, and bring with her to oncology appt on Thursday. Will collaborate w/ Dr. Elroy Channel office to fax/secure email this paperwork back to Darlyne Russian for completion and submission.  Hypothyroidism: . Controlled per last lab work; current treatment: levothyroxine 75 mcg daily . Confirmed appropriate administration . Recommend to continue current regimen at this time  Acid Reflux: . Managed; current treatment: pantoprazole 20 mg daily . Recommend to continue current regimen at this time  Supplements: Marland Kitchen Vitamin B12 injections every 2 weeks, daily Vitamin D3   Patient Goals/Self-Care Activities . Over the next 90 days, patient will:  - take medications as prescribed collaborate with  provider on medication access solutions  Follow Up Plan: Telephone follow up appointment with care management team member scheduled for: ~ 4 weeks       Medication Assistance: Corlanor obtained through [place] medication assistance program.  Enrollment ends 06/14/21. Collaborating with cardiology for Eliquis and Jardiance assitsance  Patient's preferred pharmacy is:  [facility], [place] [place] [address] Phone: [phone] Fax: [phone]  [id number]: [phone]: [phone] Care Plan and Follow Up Patient Decision:  Patient agrees to Care Plan and Follow-up.  Plan: Telephone follow up appointment with care management team member scheduled for:  ~ 4 weeks  Catie TDarnelle Maffucci PharmD, BPenn Lake Park CPhilippiClinical Pharmacist LOccidental Petroleumat BJohnson & [phone]

## 2020-08-20 NOTE — Telephone Encounter (Signed)
Called pt and got voicemail and left message that I spoke to Ander Purpura and she said that she can see pt in GYN with dr Fransisca Connors or that lauren could see her for Thursday. I see on schedule the pt to see rao on 3/10. I have asked pt to send me a message or call with what she wants to do. She had missed her 3/2 gyn visit because she was in hospital.

## 2020-08-20 NOTE — Patient Instructions (Signed)
Visit Information  PATIENT GOALS:  Goals Addressed   None     Consent to CCM Services: Ms. Tomei was given information about Chronic Care Management services today including:  1. CCM service includes personalized support from designated clinical staff supervised by her physician, including individualized plan of care and coordination with other care providers 2. 24/7 contact phone numbers for assistance for urgent and routine care needs. 3. Service will only be billed when office clinical staff spend 20 minutes or more in a month to coordinate care. 4. Only one practitioner may furnish and bill the service in a calendar month. 5. The patient may stop CCM services at any time (effective at the end of the month) by phone call to the office staff. 6. The patient will be responsible for cost sharing (co-pay) of up to 20% of the service fee (after annual deductible is met).  Patient agreed to services and verbal consent obtained.   The patient verbalized understanding of instructions, educational materials, and care plan provided today and declined offer to receive copy of patient instructions, educational materials, and care plan.   Plan: Telephone follow up appointment with care management team member scheduled for:  ~ 4 weeks  Catie Darnelle Maffucci, PharmD, [place], CPP Clinical Pharmacist [place] at [facility] [place]: Patient Care Plan: Endometrial Cancer    Problem Identified: Restablishing Palliative Care   Priority: Medium    Long-Range Goal: Palliative Care Plan Developed   Start Date: 08/05/2020  Expected End Date: 12/12/2020  Priority: Medium  Note:   Current Barriers:   Ineffective Self Health Maintenance due to weakness related to decrease in cardiac output and periods of SVT related to cancer treatments.  Patient stating recurrence of endometrial cancer and at this point she no longer wants to go through treatments.  Continues to be  in good spirits and has family support to help with ADLs, IADLs when needed.  Does report she has been notified by Hospice she is not a candidate due to her wanting treatment for episodes of SVT if experiencing more exacerbations.  Is still interested in Palliative Care.  Reports she has had a decrease in appetite with some stomach pains and nausea at times  Currently UNABLE TO independently self manage needs related to chronic health conditions due to weak heart and getting short of breath with minimal exertion  Knowledge Deficits related to short term plan for care coordination needs and long term plans for chronic disease management needs Clinical Goal(s):  Marland Kitchen Collaboration with [name], MD regarding development and update of comprehensive plan of care as evidenced by provider attestation and co-signature . Inter-disciplinary care team collaboration (see longitudinal plan of care)  Over the next 90 days, patient will work with care management team to address care coordination and chronic disease management needs related to Disease Management and Care Coordination   Interventions:   Evaluation of current treatment plan related to Cancer and Nutrition self-management and patient's adherence to plan as established by provider.  Collaboration with [name], MD regarding development and update of comprehensive plan of care as evidenced by provider attestation       and co-signature  Inter-disciplinary care team collaboration (see longitudinal plan of care)  Discussed plans with patient for ongoing care management follow up and provided patient with direct contact information for care management team  Contacted Palliative NP to verify if patient could continue with Palliative Care Services  Discussed nutrition and diet  and encouraged patient to try and maintain appetite, eating 3-4 small meals daily and treating abdominal pain as indicated  Encouraged patient to seek assistance  from family with ADLs and IADLs  Acknowledged patient in good spirits and encouraged her to continue using alternative measures to lift spirits (prayer, meditation, family support)  Compassionately and openly share information about diagnosis and prognosis; provide opportunity for patient and family to share what is important to them now.  Encouraged patient to discuss with family end of life and her now goal of living to see son's 50th birthday in May  Reviewed medications and importance of medication compliance  Encouraged to work with [employer] to provide hands on community assessment provided by [facility] Patient Goals/Self Care Activities:  Over the next 30 days, patient will:  . Avoid foods that might trigger nausea . Avoid strong odors that might trigger nausea . Change position slowly . Keep track of which medicine helps and which does not  . Develop a personal pain management plan . Learn relaxation techniques . Use medicine only as doctor says  . Do not let pain get too severe before treating . Monitor for signs of low hemoglobin (weakness, pale skin, tiredness) Follow Up Plan: The care management team will reach out to the patient again over the next 20 business days.       Patient Care Plan: Heart Failure/HTN/SVT    Problem Identified: Disease Progression (Heart Failure)   Priority: High    Long-Range Goal: Comorbidities Identified and Managed related to heart failure, SVT, and hypertension   Start Date: 08/05/2020  Expected End Date: 12/12/2020  Priority: High  Note:   Current Barriers:  Marland Kitchen Knowledge deficit related to basic heart failure pathophysiology and self care management as evidenced by patient not weighing herself daily.  Patient reports only weighing herself when she see fluid build up in her ankles.  Discussed importance of daily weight monitoring and when to call provider based on weight and when to take prn fluid medication.  Discussed how heart  failure and SVT can be related as cause and affect.  Denies any increase in lower extremity edema at this time, stating her left leg is normally with some swelling related to lymphedema.  Denies any increase in shortness of breath at this time but does endorse shortness of breath with minimal activity.  States sister in law is there to help her at this time. Case Manager Clinical Goal(s):  . patient will weigh self daily and record . patient will verbalize understanding of Heart Failure Action Plan and when to call doctor . patient will take all Heart Failure mediations as prescribed . patient will weigh daily and record (notifying MD of 3 lb weight gain over night or 5 lb in a week) Interventions:  . Collaboration with [name], MD regarding development and update of comprehensive plan of care as evidenced by provider attestation and co-signature . Inter-disciplinary care team collaboration (see longitudinal plan of care) . Basic overview and discussion of pathophysiology of Heart Failure reviewed  . Assessed need for readable accurate scales in home . Advised patient to weigh each morning after emptying bladder . Discussed importance of daily weight and advised patient to weigh and record daily . Reviewed role of diuretics in prevention of fluid overload and management of heart failure . Discussed importance of blood pressure monitoring and encouraged patient to check blood pressures at least 3 times a week and to monitor for hypotension as patient  sometimes runs with low blood pressures . Encouraged patient to monitor for elevated heart rates and to apply oxygen if heart rates increase while waiting for medical assistance . Discussed cost of medications and encouraged patient to work with CCM Pharmacist for medication assistance and to continue to work with Cardiologist office for medication assistance Patient Goals/Self-Care Activities . Call office if I gain more than 2 pounds in one  day or 5 pounds in one week . Watch for swelling in feet, ankles and legs every day . Weigh myself daily and keep track in log . Check blood pressures at least 3 times a week and monitor for low blood pressures . Fall precautions, make movements slowly to prevent falls . Monitor your heart rate for elevations Follow Up Plan: The care management team will reach out to the patient again over the next 20 business days.       Patient Care Plan: Medication Management    Problem Identified: CHF, CAD/PAD, metastatic endometrial carcinoma     Long-Range Goal: Disease Management   Start Date: 08/20/2020  This Visit's Progress: On track  Priority: High  Note:   Current Barriers:  . Unable to independently afford treatment regimen . Complex patient with terminal comorbidities  Pharmacist Clinical Goal(s):  Marland Kitchen Over the next 90 days, patient will verbalize ability to afford treatment regimen through collaboration with PharmD and provider.   Interventions: . 1:1 collaboration with [name], MD regarding development and update of comprehensive plan of care as evidenced by provider attestation and co-signature . Inter-disciplinary care team collaboration (see longitudinal plan of care) . Comprehensive medication review performed; medication list updated in electronic medical record  Endometrial Carcinoma: . Progressing; appointment with Dr. Janese Banks later this week. Reports vaginal bleeding. Current symptomatic regimen: oxycodone 5 mg - reports she rarely uses; morphine solution - reports she has never used; lorazepam 0.5 mg PRN - reports she has not used since she started chemotherapy; prochlorperazine 10 mg - reports she hasn't used recently, but still has some at home  . Peripheral neuropathy: amitriptyline 25 mg daily; denies any benefit from this dose on neuropathic pain.  . Recurrent UTIs: patient has started taking cranberry supplements. Plans to discuss this with Dr. Janese Banks at her upcoming  appointment, but wanted to make sure there were no drug interactions. None noted.  . She plans to discuss DNR form with Dr. Janese Banks at that appointment . Palliative care referral in place, appt later this month.  . Continue collaboration with oncology, palliative care. Consider dose increase of amitriptyline for better neuropathic pain control  CHF (last EF 20%); SVT: . Uncontrolled; current treatment: Jardiance 10 mg daily, Corlanor 5 mg BID, amiodarone 200 mg daily; has furosemide 20 mg + potassium 10 mEq daily but has not needed much recently; follows w/ Dr. [name];  . Cardiology assisted with patient assistance for Corlanor. Patient reports supply is set to arrive tomorrow in the mail.  . Discussed status of patient assistance for Jardiance. Darlyne Russian, RN from cardiology mailed patient the application. Given recent hospitalization, she has not had a chance to return this paperwork yet.  . Collaborated w/ Darlyne Russian. Patient is going to find paperwork, sign, and bring with her to oncology appt on Thursday. Will collaborate w/ Dr. Elroy Channel office to fax/secure email this paperwork back to Darlyne Russian for completion and submission. . Continue collaboration with cardiology along with current regimen at this time  Hyperlipidemia, secondary ASCVD prevention (PAD/CAD): . Controlled; current treatment: atorvastatin  20 mg daily  . Antiplatelet regimen: none at this time due to concurrent anticoagulant therapy . Recommend to continue current regimen at this time  Prevention of VTE: . Appropriately managed; current regimen: Eliquis 2.5 mg BID  . Discussed status of patient assistance for Eliquis. Darlyne Russian, RN from cardiology mailed patient the application. Given recent hospitalization, she has not had a chance to return this paperwork yet.  . Collaborated w/ Darlyne Russian. Patient is going to find paperwork, sign, and bring with her to oncology appt on Thursday. Will collaborate w/ Dr. Elroy Channel office to  fax/secure email this paperwork back to Darlyne Russian for completion and submission.  Hypothyroidism: . Controlled per last lab work; current treatment: levothyroxine 75 mcg daily . Confirmed appropriate administration . Recommend to continue current regimen at this time  Acid Reflux: . Managed; current treatment: pantoprazole 20 mg daily . Recommend to continue current regimen at this time  Supplements: Marland Kitchen Vitamin B12 injections every 2 weeks, daily Vitamin D3   Patient Goals/Self-Care Activities . Over the next 90 days, patient will:  - take medications as prescribed collaborate with provider on medication access solutions  Follow Up Plan: Telephone follow up appointment with care management team member scheduled for: ~ 4 weeks

## 2020-08-21 ENCOUNTER — Other Ambulatory Visit: Payer: Self-pay

## 2020-08-21 ENCOUNTER — Ambulatory Visit: Payer: PPO | Admitting: Family

## 2020-08-21 ENCOUNTER — Ambulatory Visit: Payer: PPO | Admitting: *Deleted

## 2020-08-21 ENCOUNTER — Inpatient Hospital Stay: Payer: PPO | Attending: Oncology | Admitting: Obstetrics and Gynecology

## 2020-08-21 ENCOUNTER — Other Ambulatory Visit: Payer: PPO | Admitting: Adult Health Nurse Practitioner

## 2020-08-21 VITALS — BP 113/51 | HR 78 | Temp 98.0°F | Resp 20 | Wt 139.0 lb

## 2020-08-21 DIAGNOSIS — Z7189 Other specified counseling: Secondary | ICD-10-CM

## 2020-08-21 DIAGNOSIS — Z87891 Personal history of nicotine dependence: Secondary | ICD-10-CM | POA: Insufficient documentation

## 2020-08-21 DIAGNOSIS — I509 Heart failure, unspecified: Secondary | ICD-10-CM | POA: Insufficient documentation

## 2020-08-21 DIAGNOSIS — I890 Lymphedema, not elsewhere classified: Secondary | ICD-10-CM

## 2020-08-21 DIAGNOSIS — D649 Anemia, unspecified: Secondary | ICD-10-CM | POA: Diagnosis not present

## 2020-08-21 DIAGNOSIS — C786 Secondary malignant neoplasm of retroperitoneum and peritoneum: Secondary | ICD-10-CM

## 2020-08-21 DIAGNOSIS — Z170 Estrogen receptor positive status [ER+]: Secondary | ICD-10-CM | POA: Insufficient documentation

## 2020-08-21 DIAGNOSIS — Z8571 Personal history of Hodgkin lymphoma: Secondary | ICD-10-CM | POA: Diagnosis not present

## 2020-08-21 DIAGNOSIS — I130 Hypertensive heart and chronic kidney disease with heart failure and stage 1 through stage 4 chronic kidney disease, or unspecified chronic kidney disease: Secondary | ICD-10-CM | POA: Insufficient documentation

## 2020-08-21 DIAGNOSIS — T451X5A Adverse effect of antineoplastic and immunosuppressive drugs, initial encounter: Secondary | ICD-10-CM | POA: Diagnosis not present

## 2020-08-21 DIAGNOSIS — Z9049 Acquired absence of other specified parts of digestive tract: Secondary | ICD-10-CM | POA: Insufficient documentation

## 2020-08-21 DIAGNOSIS — I10 Essential (primary) hypertension: Secondary | ICD-10-CM

## 2020-08-21 DIAGNOSIS — I700 Atherosclerosis of aorta: Secondary | ICD-10-CM | POA: Diagnosis not present

## 2020-08-21 DIAGNOSIS — Z888 Allergy status to other drugs, medicaments and biological substances status: Secondary | ICD-10-CM | POA: Insufficient documentation

## 2020-08-21 DIAGNOSIS — C541 Malignant neoplasm of endometrium: Secondary | ICD-10-CM

## 2020-08-21 DIAGNOSIS — Z515 Encounter for palliative care: Secondary | ICD-10-CM | POA: Diagnosis not present

## 2020-08-21 DIAGNOSIS — I2510 Atherosclerotic heart disease of native coronary artery without angina pectoris: Secondary | ICD-10-CM

## 2020-08-21 DIAGNOSIS — Z90722 Acquired absence of ovaries, bilateral: Secondary | ICD-10-CM | POA: Insufficient documentation

## 2020-08-21 DIAGNOSIS — R5383 Other fatigue: Secondary | ICD-10-CM | POA: Diagnosis not present

## 2020-08-21 DIAGNOSIS — Z8269 Family history of other diseases of the musculoskeletal system and connective tissue: Secondary | ICD-10-CM | POA: Insufficient documentation

## 2020-08-21 DIAGNOSIS — N939 Abnormal uterine and vaginal bleeding, unspecified: Secondary | ICD-10-CM

## 2020-08-21 DIAGNOSIS — Z882 Allergy status to sulfonamides status: Secondary | ICD-10-CM | POA: Insufficient documentation

## 2020-08-21 DIAGNOSIS — E785 Hyperlipidemia, unspecified: Secondary | ICD-10-CM

## 2020-08-21 DIAGNOSIS — I471 Supraventricular tachycardia: Secondary | ICD-10-CM

## 2020-08-21 DIAGNOSIS — I5022 Chronic systolic (congestive) heart failure: Secondary | ICD-10-CM | POA: Diagnosis not present

## 2020-08-21 DIAGNOSIS — Z923 Personal history of irradiation: Secondary | ICD-10-CM | POA: Insufficient documentation

## 2020-08-21 DIAGNOSIS — Z881 Allergy status to other antibiotic agents status: Secondary | ICD-10-CM | POA: Diagnosis not present

## 2020-08-21 DIAGNOSIS — Z90710 Acquired absence of both cervix and uterus: Secondary | ICD-10-CM | POA: Insufficient documentation

## 2020-08-21 DIAGNOSIS — K219 Gastro-esophageal reflux disease without esophagitis: Secondary | ICD-10-CM | POA: Diagnosis not present

## 2020-08-21 DIAGNOSIS — I255 Ischemic cardiomyopathy: Secondary | ICD-10-CM | POA: Insufficient documentation

## 2020-08-21 DIAGNOSIS — Z86711 Personal history of pulmonary embolism: Secondary | ICD-10-CM | POA: Diagnosis not present

## 2020-08-21 DIAGNOSIS — R1031 Right lower quadrant pain: Secondary | ICD-10-CM | POA: Diagnosis not present

## 2020-08-21 DIAGNOSIS — N898 Other specified noninflammatory disorders of vagina: Secondary | ICD-10-CM

## 2020-08-21 DIAGNOSIS — Z823 Family history of stroke: Secondary | ICD-10-CM | POA: Insufficient documentation

## 2020-08-21 DIAGNOSIS — Z7901 Long term (current) use of anticoagulants: Secondary | ICD-10-CM | POA: Insufficient documentation

## 2020-08-21 DIAGNOSIS — Z79899 Other long term (current) drug therapy: Secondary | ICD-10-CM | POA: Diagnosis not present

## 2020-08-21 DIAGNOSIS — Z800 Family history of malignant neoplasm of digestive organs: Secondary | ICD-10-CM | POA: Insufficient documentation

## 2020-08-21 DIAGNOSIS — J9601 Acute respiratory failure with hypoxia: Secondary | ICD-10-CM | POA: Diagnosis not present

## 2020-08-21 DIAGNOSIS — Z803 Family history of malignant neoplasm of breast: Secondary | ICD-10-CM | POA: Insufficient documentation

## 2020-08-21 DIAGNOSIS — N1832 Chronic kidney disease, stage 3b: Secondary | ICD-10-CM | POA: Diagnosis not present

## 2020-08-21 DIAGNOSIS — G620 Drug-induced polyneuropathy: Secondary | ICD-10-CM | POA: Diagnosis not present

## 2020-08-21 DIAGNOSIS — Z818 Family history of other mental and behavioral disorders: Secondary | ICD-10-CM | POA: Insufficient documentation

## 2020-08-21 DIAGNOSIS — Z8042 Family history of malignant neoplasm of prostate: Secondary | ICD-10-CM | POA: Insufficient documentation

## 2020-08-21 DIAGNOSIS — Z833 Family history of diabetes mellitus: Secondary | ICD-10-CM | POA: Insufficient documentation

## 2020-08-21 NOTE — Progress Notes (Signed)
[facility] Palliative Care Consult Note Telephone: [phone]  Fax: [phone]  PATIENT NAME: Jaime Hall DOB: 05-Dec-1950 MRN: [id number]  PRIMARY CARE PROVIDER:   [name], MD  REFERRING PROVIDER:  Billey Chang NP  RESPONSIBLE PARTY:   Self [phone] Jaime Hall, daughter in [place], [phone]  Chief complaint: initial palliative visit for medical decision making   RECOMMENDATIONS and PLAN:  1.  Advanced care planning.  Patient is DNR. Filled out DNR and MOST form today, uploaded to [facility], and left originals with patient.  MOST indicates DNR, limited hospital interventions, antibiotics as indicated, IV fluids for a trial period, and no feeding tube.  2.  CHF./CAD.  Patient has several cardiac issues related to cancer treatments.  EF is 30-35%. She does get edema with more in right leg than left.  She takes lasix 20 mg PRN.  She is on Corlanor 5 mg daily as she is unable to tolerate metoprolol.  She is on eliquis, amiodarone, and atrovastatin as well.  3.  Recurrent endometrial cancer.  Due to intolerance patient is not undergoing treatment.  She has been given prognosis of less than 6 months.  Discussed hospice and she was evaluated for hospice on 08/04/20.  Due to wanting to continue non covered meds and wanting to go to hospital when needed if she is having SOB, she was not deemed eligible for hospice.  We discussed this at length and she feels like she will know when it is time to transition to hospice.  She does have lorazepam and roxanol PRN, which she has not been taking.  Have instructed that the lorazepam and roxanol will also help with SOB and that she can try these for effectiveness if they can help prevent hospital visits.  She expressed understanding.  Palliative will continue to monitor for symptom management/decline and make recommendations as needed.  Follow up visit in 4 weeks.  Encouraged to call with any questions or  concerns  HISTORY OF PRESENT ILLNESS:  Jaime Hall is a 70 y.o. year old female with multiple medical problems including recurrent endometrial cancer, h/o Hodgkin's lymphoma, HTN, HLD, GERD, depression, CAD, PVD, CKD stage III, CHF with EF 30-35%, bronchiectasis. Palliative Care was asked to help address goals of care. Reviewed medical chart including latest imaging and labs.  Patient started chemo for recurrent endometrial cancer in summer of 2021.  She started having cardiac issues related to the chemo.  She had 7 ED visits and hospital stays from July to December of 2021 related to these cardiac issues.  Even after decreased dosage she was unable to tolerate chemo or immunotherapy.  Treatment was stopped in December 2021. She states that she has been given a diagnosis of less than 6 months.  Patient has abdominal pain that she describes as severe soreness, as if she has been "working out for days."  She has good/bad days.  Pain is relieved with PRN oxycodone.  States that the pain was mostly on RLQ and is now through all of lower abdomen.  She denies constipation, N/V/D.  She does have vaginal bleeding and this in ongoing.  She did have hospital stay 2/28-08/15/20 for sepsis related to UTI.  She denies dysuria today but states that she does get burning with urination when she doesn't drink enough water.  Her appetite is poor and she feels full quickly.  She tries to eat small more frequent meals. She weighed 160.6 pounds in July 2021 and currently  weighs 139 pounds.  She is able to independently perform ADLs and IADLs but has to take several rest breaks.  She states having dizziness in the past but denies any lately. She does get SOB that feels like she is smothering at times. But has not had this since stopping chemo but will get DOE. Rest of 10 point ROS asked and negative.  CODE STATUS: DNR  PPS: 70%  HOSPICE ELIGIBILITY/DIAGNOSIS: yes/recurrent endometrial cancer with prognosis of 6 months or  less  PHYSICAL EXAM:  BP 102/56 HR 73 O2 98% on RA General: NAD, frail appearing, thin Eyes: sclera anicteric and noninjected with no discharge noted ENMT:  Moist mucous membranes Cardiovascular: regular rate and rhythm Pulmonary: lung sounds clear; normal respiratory effort Abdomen: soft, + bowel sounds Extremities: right leg nonpitting edema (this is baseline), no joint deformities Skin: no rashes on exposed skin Neurological: Weakness but otherwise nonfocal  Family History  Problem Relation Age of Onset  . ALS Father   . Polymyositis Father   . Diabetes Brother   . Dementia Mother   . Cancer Maternal Aunt        breast  . Breast cancer Maternal Aunt        30's  . Stroke Maternal Grandmother   . Cancer Maternal Grandfather        prostate  . Stroke Maternal Grandfather   . Colon cancer Maternal Aunt   . Non-Hodgkin's lymphoma Cousin       PAST MEDICAL HISTORY:  Past Medical History:  Diagnosis Date  . Anxiety   . Cervicalgia   . Chronic kidney disease   . Coronary artery disease    a. 02/2012 Stress echo: severe anterior wall ischemia;  b. 02/2012 Cath/PCI: LAD 95p (3.0 x 15 Xience EX DES), D1 90ost (PTCA - bifurcational dzs), EF 45% with anterior HK;  c. 02/2013 Ex MV: fixed anterior defect w/ minor reversibility, nl EF-->Med Rx; d. 11/2019 MV: EF 41%, ant/antlat ischemia-->high risk scan; e. 11/2019 Cath: LM nl, LAD 40ost/p, patent LAD stent, LCX nl, RCA nl, RPDA/RPAV nl, EF 30  . Endometrial cancer ([place])    a. 07/2016 s/p robotic hysterectomy, BSO w/ washings, sentinel node inj, mapping, bx, adhesiolysis.  . Essential hypertension, benign   . Fibrocystic breast disease   . GERD (gastroesophageal reflux disease)   . Gestational hypertension   . Heart murmur   . HFrEF (heart failure with reduced ejection fraction) ([place])    a. 05/2020 Echo: EF <20%, glob HK.  Marland Kitchen History of anemia   . History of blood transfusion   . Hodgkin's lymphoma ([place]) 2011   a. s/p  radiation and chemo therapy  . Mixed Ischemic and Nonischemic Cardiomyopathy    a. 05/2020 Echo: EF <20%, glob HK, Nl RV size/fxn, mildly dil LA. Mild MR. Mild to mod Ao sclerosis.  . Osteoarthritis   . Polycystic ovarian disease     SOCIAL HX:  Social History   Tobacco Use  . Smoking status: Former Smoker    Packs/day: 1.00    Years: 30.00    Pack years: 30.00    Types: Cigarettes    Quit date: 07/24/2002    Years since quitting: 18.0  . Smokeless tobacco: Never Used  . Tobacco comment: quit smoking in 2000  Substance Use Topics  . Alcohol use: Not Currently    ALLERGIES:  Allergies  Allergen Reactions  . Metoprolol Other (See Comments)    Fatigue  . Adhesive [Tape] Other (See Comments)  Skin Irritation   . Antifungal [Miconazole Nitrate] Rash  . Sulfa Antibiotics Nausea And Vomiting and Rash  . Z-Pak [Azithromycin] Itching     PERTINENT MEDICATIONS:  Outpatient Encounter Medications as of 08/21/2020  Medication Sig  . amiodarone (PACERONE) 200 MG tablet Take 1 tablet (200 mg total) by mouth daily.  Marland Kitchen amitriptyline (ELAVIL) 25 MG tablet Take 1 tablet (25 mg total) by mouth at bedtime. May increase weekly as needed  . apixaban (ELIQUIS) 2.5 MG TABS tablet Take 1 tablet (2.5 mg total) by mouth 2 (two) times daily.  Marland Kitchen atorvastatin (LIPITOR) 10 MG tablet Take 1 tablet (10 mg total) by mouth daily.  . cholecalciferol (VITAMIN D3) 25 MCG (1000 UNIT) tablet Take 1,000 Units by mouth in the morning and at bedtime.  Marland Kitchen CRANBERRY PO Take 1 tablet by mouth daily.  . cyanocobalamin (,VITAMIN B-12,) 1000 MCG/ML injection Inject 1 mL (1,000 mcg total) into the muscle every 14 (fourteen) days.  . empagliflozin (JARDIANCE) 10 MG TABS tablet Take 1 tablet (10 mg total) by mouth daily.  . furosemide (LASIX) 20 MG tablet Take 1 tablet (20 mg total) by mouth as needed for fluid or edema.  . ivabradine (CORLANOR) 5 MG TABS tablet Take 1 tablet (5 mg total) by mouth 2 (two) times daily with a  meal.  . levothyroxine (SYNTHROID) 75 MCG tablet Take 1 tablet (75 mcg total) by mouth daily before breakfast.  . LORazepam (ATIVAN) 0.5 MG tablet Take 1 tablet (0.5 mg total) by mouth every 6 (six) hours as needed for anxiety (and to help with breathing). (Patient not taking: No sig reported)  . Morphine Sulfate (MORPHINE CONCENTRATE) 10 mg / 0.5 ml concentrated solution Take 0.25 mLs (5 mg total) by mouth every 6 (six) hours as needed for severe pain or shortness of breath. (Patient not taking: No sig reported)  . oxyCODONE (OXY IR/ROXICODONE) 5 MG immediate release tablet Take 5 mg by mouth every 4 (four) hours as needed for severe pain.  . pantoprazole (PROTONIX) 20 MG tablet Take 1 tablet (20 mg total) by mouth daily.  . potassium chloride (KLOR-CON) 10 MEQ tablet Take 1 tablet (10 mEq total) by mouth daily as needed (Take on days that you take the Lasix.).  . [DISCONTINUED] prochlorperazine (COMPAZINE) 10 MG tablet Take 1 tablet (10 mg total) by mouth every 6 (six) hours as needed (Nausea or vomiting). (Patient taking differently: Take 10 mg by mouth daily as needed for nausea or vomiting. )   Facility-Administered Encounter Medications as of 08/21/2020  Medication  . heparin lock flush 100 unit/mL  . sodium chloride flush (NS) 0.9 % injection 10 mL  . sodium chloride flush (NS) 0.9 % injection 10 mL     Diamone Whistler Jenetta Downer, NP

## 2020-08-21 NOTE — Chronic Care Management (AMB) (Signed)
Chronic Care Management   CCM RN Visit Note  08/21/2020 Name: Jaime Hall MRN: [id number] DOB: 1951-04-25  Subjective: Jaime Hall is a 70 y.o. year old female who is a primary care patient of Derrel Nip, Aris Everts, MD. The care management team was consulted for assistance with disease management and care coordination needs.    Engaged with patient by telephone for follow up visit in response to provider referral for case management and/or care coordination services.   Consent to Services:  The patient was given information about Chronic Care Management services, agreed to services, and gave verbal consent prior to initiation of services.  Please see initial visit note for detailed documentation.   Patient agreed to services and verbal consent obtained.   Assessment: Review of patient past medical history, allergies, medications, health status, including review of consultants reports, laboratory and other test data, was performed as part of comprehensive evaluation and provision of chronic care management services.   SDOH (Social Determinants of Health) assessments and interventions performed:    CCM Care Plan  Allergies  Allergen Reactions  . Metoprolol Other (See Comments)    Fatigue  . Adhesive [Tape] Other (See Comments)    Skin Irritation   . Antifungal [Miconazole Nitrate] Rash  . Sulfa Antibiotics Nausea And Vomiting and Rash  . Z-Pak [Azithromycin] Itching    Outpatient Encounter Medications as of 08/21/2020  Medication Sig  . amiodarone (PACERONE) 200 MG tablet Take 1 tablet (200 mg total) by mouth daily.  Marland Kitchen amitriptyline (ELAVIL) 25 MG tablet Take 1 tablet (25 mg total) by mouth at bedtime. May increase weekly as needed  . apixaban (ELIQUIS) 2.5 MG TABS tablet Take 1 tablet (2.5 mg total) by mouth 2 (two) times daily.  Marland Kitchen atorvastatin (LIPITOR) 10 MG tablet Take 1 tablet (10 mg total) by mouth daily.  . cholecalciferol (VITAMIN D3) 25 MCG (1000 UNIT) tablet Take  1,000 Units by mouth in the morning and at bedtime.  Marland Kitchen CRANBERRY PO Take 1 tablet by mouth daily.  . cyanocobalamin (,VITAMIN B-12,) 1000 MCG/ML injection Inject 1 mL (1,000 mcg total) into the muscle every 14 (fourteen) days.  . empagliflozin (JARDIANCE) 10 MG TABS tablet Take 1 tablet (10 mg total) by mouth daily.  . furosemide (LASIX) 20 MG tablet Take 1 tablet (20 mg total) by mouth as needed for fluid or edema.  . ivabradine (CORLANOR) 5 MG TABS tablet Take 1 tablet (5 mg total) by mouth 2 (two) times daily with a meal.  . levothyroxine (SYNTHROID) 75 MCG tablet Take 1 tablet (75 mcg total) by mouth daily before breakfast.  . LORazepam (ATIVAN) 0.5 MG tablet Take 1 tablet (0.5 mg total) by mouth every 6 (six) hours as needed for anxiety (and to help with breathing). (Patient not taking: Reported on 08/20/2020)  . Morphine Sulfate (MORPHINE CONCENTRATE) 10 mg / 0.5 ml concentrated solution Take 0.25 mLs (5 mg total) by mouth every 6 (six) hours as needed for severe pain or shortness of breath. (Patient not taking: Reported on 08/20/2020)  . oxyCODONE (OXY IR/ROXICODONE) 5 MG immediate release tablet Take 5 mg by mouth every 4 (four) hours as needed for severe pain. (Patient not taking: Reported on 08/20/2020)  . pantoprazole (PROTONIX) 20 MG tablet Take 1 tablet (20 mg total) by mouth daily.  . potassium chloride (KLOR-CON) 10 MEQ tablet Take 1 tablet (10 mEq total) by mouth daily as needed (Take on days that you take the Lasix.).  . [DISCONTINUED] prochlorperazine (COMPAZINE)  10 MG tablet Take 1 tablet (10 mg total) by mouth every 6 (six) hours as needed (Nausea or vomiting). (Patient taking differently: Take 10 mg by mouth daily as needed for nausea or vomiting. )   Facility-Administered Encounter Medications as of 08/21/2020  Medication  . heparin lock flush 100 unit/mL  . sodium chloride flush (NS) 0.9 % injection 10 mL  . sodium chloride flush (NS) 0.9 % injection 10 mL    Patient Active  Problem List   Diagnosis Date Noted  . Sepsis ([place]) 08/13/2020  . Chronic systolic CHF (congestive heart failure) ([place]) 07/20/2020  . SVT (supraventricular tachycardia) ([place]) 07/20/2020  . Cardiomyopathy ([place]) 07/20/2020  . Hypotension 07/20/2020  . NSTEMI (non-ST elevated myocardial infarction) ([place]) 07/20/2020  . Acute on chronic combined systolic and diastolic CHF (congestive heart failure) ([place]) 05/19/2020  . PE (pulmonary thromboembolism) ([place]) 05/19/2020  . GERD (gastroesophageal reflux disease) 05/19/2020  . UTI (urinary tract infection) 05/19/2020  . Hospital discharge follow-up 03/02/2020  . Hemoptysis 02/20/2020  . Acute on chronic congestive heart failure ([place])   . Palliative care encounter   . Acute pulmonary embolism ([place]) 02/14/2020  . Thrombocytopenia ([place]) 02/14/2020  . Hypothyroidism 02/14/2020  . Chest pain 12/29/2019  . Leukocytosis 12/29/2019  . HTN (hypertension) 12/29/2019  . HLD (hyperlipidemia) 12/29/2019  . Elevated troponin 12/29/2019  . Stage 3b chronic kidney disease ([place]) 12/29/2019  . Depression with anxiety 12/29/2019  . Abnormal cardiovascular stress test   . Dyspnea on exertion   . Goals of care, counseling/discussion 10/20/2019  . Peritoneal carcinomatosis ([place]) 10/20/2019  . Peritoneal metastases ([place]) 10/16/2019  . Right anterior knee pain 07/25/2019  . Educated about COVID-19 virus infection 11/15/2018  . Cramps, muscle, general 04/06/2018  . B12 deficiency anemia 07/24/2017  . Normocytic anemia 07/18/2017  . Back pain 07/18/2017  . Chemotherapy-induced peripheral neuropathy ([place]) 12/10/2016  . Hyperlipidemia 08/13/2016  . Hx of colonic polyps 08/13/2016  . Benign neoplasm of sigmoid colon 08/13/2016  . Endometrial cancer ([place]) 08/13/2016  . 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 Lymphedema 07/20/2016  . Chronic venous insufficiency 07/20/2016  . PAD (peripheral artery disease) ([place]) 07/20/2016  . Class 1  obesity due to excess calories without serious comorbidity with body mass index (BMI) of 34.0 to 34.9 in adult 05/27/2016  . Leg cramps 05/27/2016  . Bronchiectasis ([place]) 12/19/2015  . Pre-syncope 12/19/2015  . Prolapsed, uterovaginal, incomplete 06/24/2014  . Routine general medical examination at a health care facility 12/30/2012  . Obesity (BMI 30-39.9) 12/30/2012  . Hx of multiple pulmonary nodules 12/28/2012  . Plantar fasciitis of right foot 12/28/2012  . Hip pain, bilateral 09/12/2012  . History of Hodgkin's lymphoma 09/12/2012  . Coronary artery disease   . Chest pain on exertion 02/29/2012    Conditions to be addressed/monitored:CHF, HTN and SVT and Endometrial Cancer  Care Plan : Endometrial Cancer  Updates made by Leona Singleton, RN since 08/21/2020 12:00 AM  Problem: Restablishing Palliative Care   Priority: Medium  Long-Range Goal: Palliative Care Plan Developed   Start Date: 08/05/2020  Expected End Date: 12/12/2020  This Visit's Progress: On track  Priority: Medium  Current Barriers:   Ineffective Self Health Maintenance due to weakness related to decrease in cardiac output and periods of SVT related to cancer treatments.  Reports she is having vaginal bleeding and abdominal bloating she is wondering is related to her cancer.  Has appointment with oncologist today.  Attempting to manage  pain and nausea with eating small meals hoping to prevent abdominal bloating.  Reports she had initial visit with [facility] today and they will follow/work with her.    Currently UNABLE TO independently self manage needs related to chronic health conditions due to weak heart and getting short of breath with minimal exertion  Knowledge Deficits related to short term plan for care coordination needs and long term plans for chronic disease management needs Clinical Goal(s):  Marland Kitchen Collaboration with [name], MD regarding development and update of comprehensive plan of  care as evidenced by provider attestation and co-signature . Inter-disciplinary care team collaboration (see longitudinal plan of care)  Over the next 90 days, patient will work with care management team to address care coordination and chronic disease management needs related to Disease Management and Care Coordination   Interventions:   Evaluation of current treatment plan related to Cancer and Nutrition self-management and patient's adherence to plan as established by provider.  Collaboration with [name], MD regarding development and update of comprehensive plan of care as evidenced by provider attestation       and co-signature  Inter-disciplinary care team collaboration (see longitudinal plan of care)  Discussed plans with patient for ongoing care management follow up and provided patient with direct contact information for care management team  Confirmed with patient she has appointment scheduled with Lantana NP at Oncology office  Also confirmed patient has had initial appointment with community palliative care agency [facility], and encouraged patient to work with them  Discussed nutrition and diet and encouraged patient to try and maintain appetite, eating 3-4 small meals daily and treating abdominal pain as indicated  Acknowledged patient continues to be in good spirits and encouraged her to continue using alternative measures to lift spirits (prayer, meditation, family support)  Provided opportunity for patient to share what is important to them now.  Reviewed medications and importance of medication compliance  Encouraged patient to monitor vaginal bleeding and to discuss with oncology, monitor self for signs and symptoms of low hemoglobin Patient Goals/Self Care Activities:  Over the next 30 days, patient will:  . Develop a personal pain management plan . Learn relaxation techniques . Monitor nausea and eat small meals to prevent abdominal  bloating/pain . Do not let pain get too severe before treating . Avoid foods that might trigger nausea . Avoid strong odors that might trigger nausea . Change position slowly . Keep track of which medicine helps and which does not  . Continue to eat small meals to help with prevent abdominal pain Follow Up Plan: The care management team will reach out to the patient again over the next 45 business days.    Care Plan : Heart Failure/HTN/SVT  Updates made by Leona Singleton, RN since 08/21/2020 12:00 AM  Problem: Disease Progression (Heart Failure)   Priority: High  Long-Range Goal: Comorbidities Identified and Managed related to heart failure, SVT, and hypertension   Start Date: 08/05/2020  Expected End Date: 12/12/2020  This Visit's Progress: On track  Priority: High  Current Barriers:  Marland Kitchen Knowledge deficit related to basic heart failure pathophysiology and self care management as evidenced by patient previously not weighing herself daily.  Patient report weighing more frequently now (almost daily).  Reports weight is increased to 140 pounds this morning (baseline usually 137) and an increase in lower extremity edema (especially in left leg where lymph nodes have been removed).  Does report she did take a dose  of Lasix this morning.  Denies any recent episodes of SVT/feelings of her heart racing.  States her shortness of breath is about the same as usual.  Patient with recent hospitalization related to urinary tract infection and thought of possible sepsis.  States she completed her antibiotics yesterday and feels better from UTI stand point. Case Manager Clinical Goal(s):  . patient will weigh self daily and record . patient will verbalize understanding of Heart Failure Action Plan and when to call doctor . patient will take all Heart Failure mediations as prescribed . patient will weigh daily and record (notifying MD of 3 lb weight gain over night or 5 lb in a week) Interventions:   . Collaboration with [name], MD regarding development and update of comprehensive plan of care as evidenced by provider attestation and co-signature . Inter-disciplinary care team collaboration (see longitudinal plan of care) . Basic overview and discussion of pathophysiology of Heart Failure reviewed  . Congratulated patient on beginning to weigh self more frequently and discussed importance of daily weight monitoring, reviewed when to call provider based on weights . Advised patient to weigh each morning after emptying bladder . Discussed recent urinary tract infection and the use of cranberry tablets to help prevent future occurences . Reviewed role of diuretics in prevention of fluid overload and management of heart failure, reviewed medications and encouraged medication compliance . Discussed importance of blood pressure monitoring and encouraged patient to check blood pressures at least 3 times a week and to monitor for hypotension as patient sometimes runs with low blood pressures . Encouraged patient to monitor for elevated heart rates and to apply oxygen if heart rates increase while waiting for medical assistance . Confirmed with patient she has completed patient medication assistance forms and is working with pharmacy team at cardiology office to submit applications and follow up Patient Goals/Self-Care Activities . Call office if I gain more than 2 pounds in one day or 5 pounds in one week . Watch for swelling in feet, ankles and legs every day . Weigh myself daily and keep track in log (Good job with starting to do this so far, keep up the good work) . Check blood pressures at least 3 times a week and monitor for low blood pressures . Continue to monitor your heart rate for elevations Follow Up Plan: The care management team will reach out to the patient again over the next 45 business days.      Plan:The care management team will reach out to the patient again over the  next 45 business days.  Hubert Azure RN, MSN RN Care Management Coordinator [place] [phone] [email or web address]

## 2020-08-21 NOTE — Progress Notes (Signed)
Gynecologic Oncology Interval Visit   Referring Provider: Dr. 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 [place] female, initially seen in consultation from [name] and Garwin Brothers for endometrial cancer, s/p RA- hysterectomy, BSO, SN mapping and biopsy, pelvic adhesiolysis 2/18 and 3 cycles carbo-taxol and WPRT, with recurrent disease confirmed by biopsy s/p carboplatin/gemcitabine, and pembrolizumab/lenvatinib (required levatinib hold) currently on palliative care.  She returns to clinic today for evaluation of vaginal bleeding. She has required blood transfusion.   Gynecologic Oncology History OTILIA KAREEM is a pleasant G27P2 female who was seen in consultation from [name] and Garwin Brothers for grade 1 endometrial cancer.   Endometrial biopsy done 07/15/16 showed grade 1 endometrial cancer. Surgical management was recommended.   History is also significant for h/o Hodgkin's Lymphoma 2011 treated successfully at [facility] with chemo and RT.   Bladder sling for SUI 2009.  Benign cervical polyp removed 5/16. Has some leg edema due to chronic venous insufficiency.  Had angioplasty and cardiac stents in 2013 and was on Plavix for three years, now just ASA.  On 07/29/2016 she underwent robotic hysterectomy, and bilateral salpingo-oophorectomy with washings as well as sentinel node injection, mapping, and biopsy; pelvic adhesiolysis including enterolysis and ovariolysis > 45 minutes. She was noted to have severe pelvic adhesive disease involving gynecologic organs and the bowel. The cul de sac was obliterated and adherent to the posterior cervix. A bubble test was performed and was negative.   Postop she presented with fevers on 08/05/2016 and was admitted. CT scan 08/05/1016 revealed small pockets of air in the posterior pelvic floor likely postsurgical. There is a 1.8 x 3.8 cm complex fluid collection  containing pockets of air with somewhat organized walls within the pelvis at the hysterectomy bed most consistent with developing abscess. There is superior extension of loculated fluid along the left lateral pelvic wall anterior to the left psoas muscle. The component of the loculated fluid along the left lateral pelvic wall measures 2.0 x 4.0 cm and fluid collection along the anterior surface of the left psoas muscle measures approximately 1.8 x 3.6 cm. Blood cultures: + bacterimia with bacteroides fragilis; urine cultures negative.   She was treated with IV antibiotics with improvement in her symptoms. She was discharged on 09/05/2015 on Augmentin.   She was seen by Dr. Leonides Schanz and complained of vaginal leakage. 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 Subsequently she underwent a double tied tests with oral Pyridium as well as backfilling the bladder with blue. 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  Fluid in her vaginal vault was also appreciated. She was referred to Dr. [name], Urology on 08/14/2016. No obvious fistula or leak appreciated on CT urogram, although there is no complete delayed imaging to assess the right-sided ureter.   CT urogram 08/13/2016 IMPRESSION: 1. Stable size of an abscess at the vaginal cuff with superior extension along the left pelvic sidewall to the posterior margin of the mid sigmoid colon. 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 2. No hydronephrosis. Normal caliber ureters. No evidence of contrast extravasation from the ureters or bladder, with limitations as described. 3. No evidence of metastatic disease in the abdomen or pelvis. 4. Aortic atherosclerosis.  On 08/17/3016 she underwent Exam under anesthesia, vaginoscopy, cystoscopy, cystogram with interpretation of fluoroscopy less than 30 minutes, bilateral retrograde pyelogram, and right ureteral  stent placement.    Intraoperative findings: No obvious ureterovaginal vesicovaginal fistula appreciated. Mild right hydroureteronephrosis down to level of the distal ureter with slight medial deviation without filling defects. Slight mucopurulent drainage from left apical vaginal cuff, specimen cultured.  Culture revealed bateroides fragilis and she was started on a 2 week course of Flagyl iniated on 08/24/2016.  Stent removed on 09/22/2016.   Pathology DIAGNOSIS:  A. SENTINEL LYMPH NODE, RIGHT MID OBTURATOR; EXCISION:  - NO TUMOR SEEN IN ONE LYMPH NODE (0/1).   B. SENTINEL LYMPH NODE, RIGHT EXTERNAL ILIAC; EXCISION:  - NO TUMOR SEEN IN ONE LYMPH NODE (0/1).   C. SENTINEL LYMPH NODE, LEFT EXTERNAL ILIAC; EXCISION:  - NO TUMOR SEEN IN ONE LYMPH NODE (0/1).   D. UTERUS WITH CERVIX, BILATERAL FALLOPIAN TUBES AND OVARIES;  HYSTERECTOMY AND BILATERAL SALPINGO-OOPHORECTOMY:  - SEROUS CARCINOMA, 4.5 CM(SEE NOTE).  - LYMPHOVASCULAR INVASION PRESENT.  - NO TUMOR SEEN IN BILATERAL OVARIES AND FALLOPIAN TUBES.  ENDOMETRIUM:  Procedure: Hysterectomy with bilateral salpingo-oophorectomy  Histologic Type: Serous carcinoma  Myometrial Invasion: Present       Depth of invasion (millimeters): 13 mm  Myometrial thickness (millimeters): 14 mm       Percentage of myometrial invasion: 93%  Uterine Serosa Involvement: Not identified  Cervical Stromal Involvement: Not identified    BIOMARKER REPORTING TEMPLATE:  p53 Expression: Abnormal strong diffuse overexpression (>90%)  ER/PR negative.  MS stable Her2neu assessment was performed given serous histology and was negative.   CT scan on 09/10/2016 with findings that are very reassuring so stent removed. 1. Interval resolution of abscess involving the vaginal cuff. 2. The proximal end of the right ureteral stent is coiled in the proximal right ureter. No hydronephrosis. 3. No evidence of metastatic disease or other significant changes. 4. Aortic  atherosclerosis.   She was referred to Dr. Janese Banks for adjuvant chemotherapy and completed 3 cycles of carbo/taxol. Notable side effects include neuropathy in hands due to prior chemo in past for Hodgkin's disease and more recent regimen.  Taxol dose was reduced 25% with cycle 3 due to neuropathy.    Whole pelvis radiation with Dr Baruch Gouty completed in August 2018.  CT A/P 08/31/2017 IMPRESSION: No evidence of recurrent or metastatic disease.  10/06/2017 PET  IMPRESSION: NEGATIVE FOR MALIGNANCY 1. No findings of active malignancy in the neck, chest, abdomen, or pelvis. 2. Subacute sacral insufficiency fracture with transverse component at S1-2 and vertical components in both sacral ala. There is also a nondisplaced subacute fracture of the right L5 transverse process. Low-grade associated metabolic activity favors a benign etiology. 3. Other imaging findings of potential clinical significance: Aortic Atherosclerosis (ICD10-I70.0). Coronary atherosclerosis. Mild cardiomegaly. Stable paramediastinal fibrosis. Sigmoid diverticulosis.  She has continued back pain with prior hx of sacral insufficiency fracture and fracture of L5 transverse process. She was evaluated for this and declined intervention  She developed right lower quadrant pain and CT scan and PET that showed carcinomatosis.  No disease seen outside abdomen.  Biopsy done confirmed recurrent disease. She has significant residual neuropathy from prior chemo for this cancer and for lymphoma.  Omental biopsy confirmed recurrent high-grade serous carcinoma.  Foundation 1 testing showed following alterations/biomarkers: Microsatellites status stable. Tumor mutational burden 5Muts/Mb, EPHB4 amplification, ESR1 amplification, HNF1 G261fs*25, PARP1 amplification, PIK3R1 loss, PPP2R1A S256F, PTEN loss, TP53 Y170fs*44.   Repeat echocardiogram showed EF of 30-35%.  Cardiac cath unremarkable consistent with nonischemic cardiomyopathy.  Therefore Doxil was  not chosen and she would be getting carboplatin and gemcitabine.  Initiated on 11/06/2019 and received 3 cycles with Neulasta support.  Chemotherapy was complicated by significant anemia causing supply demand cardiac ischemia and hospitalization.    01/11/2020. Switched to [employer] and lenvatinib starting.lenvatinib has been dose reduced from 14 mg to 10 mg and started Xanax.  TSH elevated at 15 when she started [facility] and she was startted on levothyroxine 50 mcg.   CT 01/16/20- CT Chest Abdomen Pelvis w contrast showed significant interval reduction in size of multiple peritoneal and omental nodules in the anterior right hemiabdomen as well as soft tissue of the low right hemipelvis consistent with treatment response.  However, and new peritoneal and omental nodule in the anterior left hemiabdomen concerning for mixed response.  No evidence of metastatic disease in the chest.  Redemonstrated bilateral paramedian and perihilar radiation fibrosis consistent with history of Hodgkin's lymphoma and prior mantle radiation.  Coronary artery disease and hepatic steatosis.  CA 125 11/06/19  127 12/28/19  19.7 02/01/20  17.8  02/07/20 Pelvic exam decreased mass - not visible in the vagina. On BME 1.5-2 cm.  Tumor Markers:  Foundation 1 testing showed following alterations/biomarkers: Microsatellites status stable. Tumor mutational burden 5Muts/Mb, EPHB4 amplification, ESR1 amplification, HNF1 G24f*25, PARP1 amplification, PIK3R1 loss, PPP2R1A S256F, PTEN loss, TP53 [id number].  Problem List: Patient Active Problem List   Diagnosis Date Noted   Sepsis (HCKapp [place]0394/50/3888 Chronic systolic CHF (congestive heart failure) (HCCambridge02/10/2020   SVT (supraventricular tachycardia) (HCYoungsville02/10/2020   Cardiomyopathy (HCShelter Cove02/10/2020   Hypotension 07/20/2020   NSTEMI (non-ST elevated myocardial infarction) (HCMink[place]02/10/2020   Acute on chronic combined systolic and diastolic CHF (congestive heart failure) (HCGlenwood 05/19/2020   PE (pulmonary thromboembolism) (HCHarrisville12/10/2019   GERD (gastroesophageal reflux disease) 05/19/2020   UTI (urinary tract infection) 05/19/2020   Hospital discharge follow-up 03/02/2020   Hemoptysis 02/20/2020   Acute on chronic congestive heart failure (HCGalatia   Palliative care encounter    Acute pulmonary embolism (HCNorton Center09/06/2019   Thrombocytopenia (HCMarysville09/06/2019   Hypothyroidism 02/14/2020   Chest pain 12/29/2019   Leukocytosis 12/29/2019   HTN (hypertension) 12/29/2019   HLD (hyperlipidemia) 12/29/2019   Elevated troponin 12/29/2019   Stage 3b chronic kidney disease (HCForest Ac[place]07/16/2021   Depression with anxiety 12/29/2019   Abnormal cardiovascular stress test    Dyspnea on exertion    Goals of care, counseling/discussion 10/20/2019   Peritoneal carcinomatosis (HCBainbridge05/12/2019   Peritoneal metastases (HCPickens05/08/2019   Right anterior knee pain 07/25/2019   Educated about COVID-19 virus infection 11/15/2018   Cramps, muscle, general 04/06/2018   B12 deficiency anemia 07/24/2017   Normocytic anemia 07/18/2017   Back pain 07/18/2017   Chemotherapy-induced peripheral neuropathy (HCStephens06/28/2018   Hyperlipidemia 08/13/2016   Hx of colonic polyps 08/13/2016   Benign neoplasm of sigmoid colon 08/13/2016   Endometrial cancer (HCRoyal Lakes03/06/2016   Pelvic adhesive disease 08/13/2016   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   Lymphedema 07/20/2016   Chronic venous insufficiency 07/20/2016   PAD (peripheral artery disease) (HCForest City02/10/2016   Class 1 obesity due to excess calories without serious comorbidity with body mass index (BMI) of 34.0 to 34.9 in adult 05/27/2016   Leg cramps 05/27/2016   Bronchiectasis (HCMobile07/11/2015   Pre-syncope 12/19/2015   Prolapsed, uterovaginal, incomplete 06/24/2014   Routine general medical examination at a health care facility 12/30/2012   Obesity (BMI 30-39.9) 12/30/2012   Hx of multiple pulmonary  nodules 12/28/2012   Plantar fasciitis of right foot 12/28/2012   Hip pain,  bilateral 09/12/2012   History of Hodgkin's lymphoma 09/12/2012   Coronary artery disease    Chest pain on exertion 02/29/2012    Past Medical History: Past Medical History:  Diagnosis Date   Anxiety    Cervicalgia    Chronic kidney disease    Coronary artery disease    a. 02/2012 Stress echo: severe anterior wall ischemia;  b. 02/2012 Cath/PCI: LAD 95p (3.0 x 15 Xience EX DES), D1 90ost (PTCA - bifurcational dzs), EF 45% with anterior HK;  c. 02/2013 Ex MV: fixed anterior defect w/ minor reversibility, nl EF-->Med Rx; d. 11/2019 MV: EF 41%, ant/antlat ischemia-->high risk scan; e. 11/2019 Cath: LM nl, LAD 40ost/p, patent LAD stent, LCX nl, RCA nl, RPDA/RPAV nl, EF 30   Endometrial cancer ([place])    a. 07/2016 s/p robotic hysterectomy, BSO w/ washings, sentinel node inj, mapping, bx, adhesiolysis.   Essential hypertension, benign    Fibrocystic breast disease    GERD (gastroesophageal reflux disease)    Gestational hypertension    Heart murmur    HFrEF (heart failure with reduced ejection fraction) ([place])    a. 05/2020 Echo: EF <20%, glob HK.   History of anemia    History of blood transfusion    Hodgkin's lymphoma ([place]) 2011   a. s/p radiation and chemo therapy   Mixed Ischemic and Nonischemic Cardiomyopathy    a. 05/2020 Echo: EF <20%, glob HK, Nl RV size/fxn, mildly dil LA. Mild MR. Mild to mod Ao sclerosis.   Osteoarthritis    Polycystic ovarian disease     Past Surgical History: Past Surgical History:  Procedure Laterality Date   ABDOMINAL HYSTERECTOMY     bladder sling     CARDIAC CATHETERIZATION  02/2012   [facility] 1 stent place   CERVICAL POLYPECTOMY     CHOLECYSTECTOMY  1982   COLONOSCOPY WITH PROPOFOL N/A 02/05/2015   Procedure: COLONOSCOPY WITH PROPOFOL;  Surgeon: Lucilla Lame, MD;  Location: [facility] ENDOSCOPY;  Service: Endoscopy;  Laterality: N/A;   CORONARY ANGIOPLASTY   02/2012   left/right s/p balloon   CYSTOGRAM N/A 08/17/2016   Procedure: CYSTOGRAM;  Surgeon: Hollice Espy, MD;  Location: [facility] ORS;  Service: Urology;  Laterality: N/A;   CYSTOSCOPY N/A 08/17/2016   Procedure: CYSTOSCOPY EXAM UNDER ANESTHESIA;  Surgeon: Hollice Espy, MD;  Location: [facility] ORS;  Service: Urology;  Laterality: N/A;   CYSTOSCOPY W/ RETROGRADES Bilateral 08/17/2016   Procedure: CYSTOSCOPY WITH RETROGRADE PYELOGRAM;  Surgeon: Hollice Espy, MD;  Location: [facility] ORS;  Service: Urology;  Laterality: Bilateral;   CYSTOSCOPY WITH STENT PLACEMENT Right 08/17/2016   Procedure: CYSTOSCOPY WITH STENT PLACEMENT;  Surgeon: Hollice Espy, MD;  Location: [facility] ORS;  Service: Urology;  Laterality: Right;   heart stent'  2013   kidney stent Right 2018   LYMPH NODE BIOPSY  2011   diagnosis of hodgkins lymphoma   PELVIC LYMPH NODE DISSECTION N/A 07/29/2016   Procedure: PELVIC/AORTIC LYMPH NODE SAMPLING;  Surgeon: [name], MD;  Location: [facility] ORS;  Service: Gynecology;  Laterality: N/A;   PORTA CATH INSERTION N/A 09/22/2016   Procedure: Glori Luis Cath Insertion;  Surgeon: [name], MD;  Location: [place] CV LAB;  Service: Cardiovascular;  Laterality: N/A;   PORTA CATH INSERTION N/A 10/27/2019   Procedure: PORTA CATH INSERTION;  Surgeon: [name], MD;  Location: [place] CV LAB;  Service: Cardiovascular;  Laterality: N/A;   PORTA CATH REMOVAL N/A 11/17/2016   Procedure: Glori Luis Cath Removal;  Surgeon: [name],  MD;  Location: [place] CV LAB;  Service: Cardiovascular;  Laterality: N/A;   RIGHT/LEFT HEART CATH AND CORONARY ANGIOGRAPHY N/A 12/04/2019   Procedure: RIGHT/LEFT HEART CATH AND CORONARY ANGIOGRAPHY;  Surgeon: Wellington Hampshire, MD;  Location: [place] CV LAB;  Service: Cardiovascular;  Laterality: N/A;   ROBOTIC ASSISTED TOTAL HYSTERECTOMY WITH BILATERAL SALPINGO OOPHERECTOMY N/A 07/29/2016   Procedure: ROBOTIC ASSISTED TOTAL  HYSTERECTOMY WITH BILATERAL SALPINGO OOPHORECTOMY;  Surgeon: [name], MD;  Location: [facility] ORS;  Service: Gynecology;  Laterality: N/A;   SENTINEL NODE BIOPSY N/A 07/29/2016   Procedure: SENTINEL NODE BIOPSY;  Surgeon: [name], MD;  Location: [facility] ORS;  Service: Gynecology;  Laterality: N/A;   transobturator sling N/A 2009   [place]    Family History: Family History  Problem Relation Age of Onset   ALS Father    Polymyositis Father    Diabetes Brother    Dementia Mother    Cancer Maternal Aunt        breast   Breast cancer Maternal Aunt        30's   Stroke Maternal Grandmother    Cancer Maternal Grandfather        prostate   Stroke Maternal Grandfather    Colon cancer Maternal Aunt    Non-Hodgkin's lymphoma Cousin     Social History: Works as Dealer Social History   Socioeconomic History   Marital status: Widowed    Spouse name: Not on file   Number of children: Not on file   Years of education: Not on file   Highest education level: Not on file  Occupational History   Not on file  Tobacco Use   Smoking status: Former Smoker    Packs/day: 1.00    Years: 30.00    Pack years: 30.00    Types: Cigarettes    Quit date: 07/24/2002    Years since quitting: 18.0   Smokeless tobacco: Never Used   Tobacco comment: quit smoking in 2000  Vaping Use   Vaping Use: Never used  Substance and Sexual Activity   Alcohol use: Not Currently   Drug use: No   Sexual activity: Never  Other Topics Concern   Not on file  Social History Narrative   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    Social Determinants of Health   Financial Resource Strain: Medium Risk   Difficulty of Paying Living Expenses: Somewhat hard  Food Insecurity: No Food Insecurity   Worried About Charity fundraiser in the Last Year: Never true   Ran Out of Food in the Last Year: Never true   Transportation Needs: No Transportation Needs   Lack of Transportation (Medical): No   Lack of Transportation (Non-Medical): No  Physical Activity: Unknown   Days of Exercise per Week: 0 days   Minutes of Exercise per Session: Not on file  Stress: No Stress Concern Present   Feeling of Stress : Only a little  Social Connections: Unknown   Frequency of Communication with Friends and Family: More than three times a week   Frequency of Social Gatherings with Friends and Family: More than three times a week   Attends Religious Services: Not on file   Active Member of Clubs or Organizations: Not on file   Attends Archivist Meetings: Not on file   Marital Status: Not on file  Intimate Partner Violence: Not At Risk   Fear  of Current or Ex-Partner: No   Emotionally Abused: No   Physically Abused: No   Sexually Abused: No    Allergies: Allergies  Allergen Reactions   Metoprolol Other (See Comments)    Fatigue   Adhesive [Tape] Other (See Comments)    Skin Irritation    Antifungal [Miconazole Nitrate] Rash   Sulfa Antibiotics Nausea And Vomiting and Rash   Z-Pak [Azithromycin] Itching    Current Medications: Current Outpatient Medications  Medication Sig Dispense Refill   amiodarone (PACERONE) 200 MG tablet Take 1 tablet (200 mg total) by mouth daily. 30 tablet 1   amitriptyline (ELAVIL) 25 MG tablet Take 1 tablet (25 mg total) by mouth at bedtime. May increase weekly as needed 90 tablet 3   apixaban (ELIQUIS) 2.5 MG TABS tablet Take 1 tablet (2.5 mg total) by mouth 2 (two) times daily. 60 tablet 1   atorvastatin (LIPITOR) 10 MG tablet Take 1 tablet (10 mg total) by mouth daily. 90 tablet 0   cholecalciferol (VITAMIN D3) 25 MCG (1000 UNIT) tablet Take 1,000 Units by mouth in the morning and at bedtime.     CRANBERRY PO Take 1 tablet by mouth daily.     cyanocobalamin (,VITAMIN B-12,) 1000 MCG/ML injection Inject 1 mL (1,000 mcg total) into the  muscle every 14 (fourteen) days. 2 mL 11   empagliflozin (JARDIANCE) 10 MG TABS tablet Take 1 tablet (10 mg total) by mouth daily. 30 tablet 5   furosemide (LASIX) 20 MG tablet Take 1 tablet (20 mg total) by mouth as needed for fluid or edema. 90 tablet 0   ivabradine (CORLANOR) 5 MG TABS tablet Take 1 tablet (5 mg total) by mouth 2 (two) times daily with a meal. 60 tablet 5   levothyroxine (SYNTHROID) 75 MCG tablet Take 1 tablet (75 mcg total) by mouth daily before breakfast. 30 tablet 2   oxyCODONE (OXY IR/ROXICODONE) 5 MG immediate release tablet Take 5 mg by mouth every 4 (four) hours as needed for severe pain.     pantoprazole (PROTONIX) 20 MG tablet Take 1 tablet (20 mg total) by mouth daily. 90 tablet 1   potassium chloride (KLOR-CON) 10 MEQ tablet Take 1 tablet (10 mEq total) by mouth daily as needed (Take on days that you take the Lasix.). 90 tablet 0   LORazepam (ATIVAN) 0.5 MG tablet Take 1 tablet (0.5 mg total) by mouth every 6 (six) hours as needed for anxiety (and to help with breathing). (Patient not taking: No sig reported) 120 tablet 0   Morphine Sulfate (MORPHINE CONCENTRATE) 10 mg / 0.5 ml concentrated solution Take 0.25 mLs (5 mg total) by mouth every 6 (six) hours as needed for severe pain or shortness of breath. (Patient not taking: No sig reported) 30 mL 0   No current facility-administered medications for this visit.   Facility-Administered Medications Ordered in Other Visits  Medication Dose Route Frequency Provider Last Rate Last Admin   heparin lock flush 100 unit/mL  500 Units Intravenous Once Sindy Guadeloupe, MD       sodium chloride flush (NS) 0.9 % injection 10 mL  10 mL Intravenous PRN Sindy Guadeloupe, MD   10 mL at 12/07/19 0914   sodium chloride flush (NS) 0.9 % injection 10 mL  10 mL Intravenous PRN Sindy Guadeloupe, MD   10 mL at 01/04/20 0850   Review of Systems General: fatigue and decreased appetite  HEENT: no complaints  Lungs: no complaints   Cardiac: no complaints  GI: abdominal pain, bloating/distension  GU: vaginal bleeding  Musculoskeletal: no complaints  Extremities: no complaints  Skin: no complaints  Neuro: no complaints  Endocrine: no complaints  Psych: no complaints        Objective:  Physical Examination:  BP (!) 113/51    Pulse 78    Temp 98 F (36.7 C)    Resp 20    Wt 139 lb (63 kg)    SpO2 100%    BMI 26.26 kg/m    ECOG Performance Status: 2 - Symptomatic, <50% confined to bed  GENERAL: Patient is a well appearing female in no acute distress HEENT:  PERRL, neck supple with midline trachea. Thyroid without masses.  ABDOMEN:  Soft, nontender, nondistended. No masses or ascites. Tender LLQ to palpation but no RGR.    EXTREMITIES:  No peripheral edema.   NEURO:  Nonfocal. Well oriented.  Appropriate affect.  Pelvic: EGBUS: no lesions Cervix: surgically absent Vagina: grossly visible 2.5 cm lesion left fornix with small amount of actively bleeding. Monsel's applied to the vaginal lesion.  Uterus: surgically absent BME: there is the palpable visible vaginal lesion and a larger vaginal cuff mass extending into the pelvis that is at least 4 cm and slightly tender to palpation. I did a gentle exam so as not to increase bleeding.  Rectovaginal: deferred.   Labs  Lab Results  Component Value Date   WBC 17.3 (H) 08/15/2020   HGB 9.0 (L) 08/15/2020   HCT 29.0 (L) 08/15/2020   MCV 86.8 08/15/2020   PLT 212 08/15/2020     Chemistry      Component Value Date/Time   NA 135 08/15/2020 0426   NA 140 02/15/2019 0000   NA 142 03/27/2014 1214   K 4.0 08/15/2020 0426   K 4.6 03/27/2014 1214   CL 105 08/15/2020 0426   CL 107 03/27/2014 1214   CO2 23 08/15/2020 0426   CO2 33 (H) 03/27/2014 1214   BUN 27 (H) 08/15/2020 0426   BUN 19 02/15/2019 0000   BUN 14 03/27/2014 1214   CREATININE 1.37 (H) 08/15/2020 0426   CREATININE 1.04 03/27/2014 1214   GLU 108 02/15/2019 0000      Component Value Date/Time    CALCIUM 8.6 (L) 08/15/2020 0426   CALCIUM 8.7 03/27/2014 1214   ALKPHOS 96 06/10/2020 0822   ALKPHOS 81 03/27/2014 1214   AST 20 06/10/2020 0822   AST 17 03/27/2014 1214   ALT 11 06/10/2020 0822   ALT 22 03/27/2014 1214   BILITOT 0.5 06/10/2020 0822   BILITOT 1.1 (H) 03/27/2014 1214     3/2/2 Hb 8.9  Radiology As per HPI      Assessment:  DIAMANTINA EDINGER is a 70 y.o. female diagnosed with recurrent high grade serous endometrial cancer, high intermediate risk disease s/p RA- hysterectomy, BSO, SN mapping and biopsy, pelvic adhesiolysis 2/18 and 3 cycles carbo-taxol and WPRT.  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 Complete resolution of vaginal cuff abscess.  Severe pelvic adhesive disease.  5/21 Recurrent disease with carcinomatosis  (not a candidate for Doxil due to EF and prior adriamycin); carboplatin/gemcitabine chemotherapy x 3 cycles but discontinued due to hematologic toxicity, cardiac issues and mixed response; s/p lenvatinib and pembrolizumab discontinued for progressive disease. Currently on palliative care. Vaginal bleeding with stable Hb/Hct. Treatment with Monsel's to reduce  bleeding.   Leukocytosis, most likely due to lymphoma, asymptomatic  Foundation 1 testing showed following alterations/biomarkers: Microsatellites status stable. Tumor mutational burden 5Muts/Mb, EPHB4 amplification, ESR1 amplification, HNF1 G259f*25, PARP1 amplification, PIK3R1 loss, PPP2R1A S256F, PTEN loss, TP53 [id number].   Medical co-morbidities complicating care: 30 pack year former smoker s/p coronary angioplasty and stents 2013 and takes baby ASA. Prior chemo and RT for lymphoma.    Plan:   Problem List Items Addressed This Visit      [facility]   [facility]   Other  Visit Diagnoses    [place]   -  Primary   Vaginal lesion       Vaginal bleeding       Counseling and coordination of care         We discussed management options. We will check with Dr. ChBaruch Goutyo determine if additional radiation is an option as she has already received WPRT. Another option is embolization.   We discussed stopping anti-coagulation which will reduce the bleeding. Her goal is to attend her son's 50th birthday. Therefore I recommended that she continue anti-coagulation to reduce her risk of recurrent VTE.   She is in a challenging situation. She will contact usKoreaf her symptoms worsen. We reviewed goal of care.    I personally had a face to face interaction and evaluated the patient, and performed the physical exam. The assessment and plan which was fully formulated by me.  Counseling was completed by me.     Taiwana Willison AlGaetana MichaelisMD

## 2020-08-21 NOTE — Patient Instructions (Signed)
Visit Information  PATIENT GOALS: Goals Addressed            This Visit's Progress   . (RNCM) Keep Nausea Under Control and Maintain Appetite   On track    Timeframe:  Long-Range Goal Priority:  Medium Start Date:   08/05/20                          Expected End Date:   12/12/20                    Follow Up Date 09/16/20    . Avoid foods that might trigger nausea . Avoid strong odors that might trigger nausea . Change position slowly . Keep track of which medicine helps and which does not  . Continue to eat small meals to help with prevent abdominal pain   Why is this important?    During cancer treatment, it may be hard to keep your weight and strength up.   Feeling sick a lot can make life tough.   There are some simple things to try that will help you have fewer bad days.   Eating a variety of healthy foods that are high in calories and protein will help.   Keeping away from foods and smells that might make you feel worse can make a big difference.     Notes:     Marland Kitchen (RNCM) Keep Pain Under Control   On track    Timeframe:  Long-Range Goal Priority:  Medium Start Date:  08/05/20                           Expected End Date:  12/12/20                     Follow Up Date 4/422   . Develop a personal pain management plan . Learn relaxation techniques . Monitor nausea and eat small meals to prevent abdominal bloating/pain . Do not let pain get too severe before treating   Why is this important?    Day-to-day life can be hard when you have pain.   Even a small change in emotion or a physical problem can make pain better or worse.   Coping with pain depends on how the mind and body reacts to pain.   Pain medicine is just one piece of the treatment puzzle.   There are many tools to help manage pain. A combination of them can be used to best meet your needs.     Notes:     Marland Kitchen (RNCM) Track and Manage Swelling, Heart Rate and Blood Pressure   On track    Timeframe:   Long-Range Goal Priority:  High Start Date:  08/05/20                           Expected End Date:   12/12/20                    Follow Up Date 09/16/2020    . Call office if I gain more than 2 pounds in one day or 5 pounds in one week . Watch for swelling in feet, ankles and legs every day . Weigh myself daily and keep track in log (Good job with starting to do this so far, keep up the good work) . Check blood pressures at least  3 times a week and monitor for low blood pressures . Continue to monitor your heart rate for elevations   Why is this important?    It is important to check your weight daily and watch how much salt and liquids you have.   It will help you to manage your heart failure.    Notes:        Patient verbalizes understanding of instructions provided today and agrees to view in [place].   The care management team will reach out to the patient again over the next 45 business days.   Hubert Azure RN, MSN RN Care Management Coordinator [place] [phone] [email or web address]

## 2020-08-22 ENCOUNTER — Other Ambulatory Visit: Payer: Self-pay | Admitting: *Deleted

## 2020-08-22 ENCOUNTER — Inpatient Hospital Stay (HOSPITAL_BASED_OUTPATIENT_CLINIC_OR_DEPARTMENT_OTHER): Payer: PPO | Admitting: Oncology

## 2020-08-22 ENCOUNTER — Inpatient Hospital Stay: Payer: PPO

## 2020-08-22 ENCOUNTER — Inpatient Hospital Stay (HOSPITAL_BASED_OUTPATIENT_CLINIC_OR_DEPARTMENT_OTHER): Payer: PPO | Admitting: Hospice and Palliative Medicine

## 2020-08-22 ENCOUNTER — Telehealth: Payer: Self-pay

## 2020-08-22 ENCOUNTER — Encounter: Payer: Self-pay | Admitting: Oncology

## 2020-08-22 VITALS — BP 103/53 | HR 81 | Temp 98.0°F | Resp 18 | Wt 138.7 lb

## 2020-08-22 VITALS — BP 103/53 | HR 81 | Temp 98.0°F | Resp 16 | Wt 138.7 lb

## 2020-08-22 DIAGNOSIS — Z515 Encounter for palliative care: Secondary | ICD-10-CM

## 2020-08-22 DIAGNOSIS — C541 Malignant neoplasm of endometrium: Secondary | ICD-10-CM

## 2020-08-22 DIAGNOSIS — D729 Disorder of white blood cells, unspecified: Secondary | ICD-10-CM

## 2020-08-22 DIAGNOSIS — C786 Secondary malignant neoplasm of retroperitoneum and peritoneum: Secondary | ICD-10-CM

## 2020-08-22 LAB — CBC WITH DIFFERENTIAL/PLATELET
Abs Immature Granulocytes: 0.27 10*3/uL — ABNORMAL HIGH (ref 0.00–0.07)
Basophils Absolute: 0.1 10*3/uL (ref 0.0–0.1)
Basophils Relative: 1 %
Eosinophils Absolute: 0.4 10*3/uL (ref 0.0–0.5)
Eosinophils Relative: 2 %
HCT: 33.2 % — ABNORMAL LOW (ref 36.0–46.0)
Hemoglobin: 10.3 g/dL — ABNORMAL LOW (ref 12.0–15.0)
Immature Granulocytes: 1 %
Lymphocytes Relative: 6 %
Lymphs Abs: 1.2 10*3/uL (ref 0.7–4.0)
MCH: 27.5 pg (ref 26.0–34.0)
MCHC: 31 g/dL (ref 30.0–36.0)
MCV: 88.5 fL (ref 80.0–100.0)
Monocytes Absolute: 0.7 10*3/uL (ref 0.1–1.0)
Monocytes Relative: 3 %
Neutro Abs: 20 10*3/uL — ABNORMAL HIGH (ref 1.7–7.7)
Neutrophils Relative %: 87 %
Platelets: 258 10*3/uL (ref 150–400)
RBC: 3.75 MIL/uL — ABNORMAL LOW (ref 3.87–5.11)
RDW: 20.1 % — ABNORMAL HIGH (ref 11.5–15.5)
WBC: 22.6 10*3/uL — ABNORMAL HIGH (ref 4.0–10.5)
nRBC: 0 % (ref 0.0–0.2)

## 2020-08-22 LAB — COMPREHENSIVE METABOLIC PANEL
ALT: 19 U/L (ref 0–44)
AST: 41 U/L (ref 15–41)
Albumin: 3 g/dL — ABNORMAL LOW (ref 3.5–5.0)
Alkaline Phosphatase: 227 U/L — ABNORMAL HIGH (ref 38–126)
Anion gap: 10 (ref 5–15)
BUN: 23 mg/dL (ref 8–23)
CO2: 23 mmol/L (ref 22–32)
Calcium: 9.4 mg/dL (ref 8.9–10.3)
Chloride: 107 mmol/L (ref 98–111)
Creatinine, Ser: 1.7 mg/dL — ABNORMAL HIGH (ref 0.44–1.00)
GFR, Estimated: 32 mL/min — ABNORMAL LOW (ref >60)
Glucose, Bld: 143 mg/dL — ABNORMAL HIGH (ref 70–99)
Potassium: 5.2 mmol/L — ABNORMAL HIGH (ref 3.5–5.1)
Sodium: 140 mmol/L (ref 135–145)
Total Bilirubin: 0.9 mg/dL (ref 0.3–1.2)
Total Protein: 7 g/dL (ref 6.5–8.1)

## 2020-08-22 MED ORDER — OXYCODONE HCL 5 MG PO TABS: 5.0000 mg | ORAL_TABLET | ORAL | 0 refills | Status: DC | PRN

## 2020-08-22 NOTE — Progress Notes (Signed)
[place]  [phone] [phone]   Name: [name] Date: 08/22/2020 MRN: [id number]  DOB: 1950-10-03  Patient Care Team: [name], MD as PCP - General (Internal Medicine) Wellington Hampshire, MD as PCP - Cardiology (Cardiology) [name], MD as Consulting Physician (General Surgery) Mellody Drown, MD as Referring Physician (Obstetrics and Gynecology) [name], MD as Referring Physician (Obstetrics and Gynecology) Hollice Espy, MD as Consulting Physician (Urology) [name], RN as Registered Nurse Sindy Guadeloupe, MD as Consulting Physician (Oncology) De Hollingshead, RPH-CPP (Pharmacist) Leona Singleton, RN as Case Manager    REASON FOR CONSULTATION: Jaime Hall is a 70 y.o. 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 Patient was originally diagnosed in 2018 with stage I endometrial cancer and underwent TAH/BSO and adjuvant chemotherapy but unfortunately was found to have disease recurrence in May 2021.  Patient has had recurrent hospitalizations for tachycardia and CHF.  She is most recently hospitalized  in February 2022 for UTI.  Patient was referred to hospice but was ultimately not admitted to their services due to high cost medications. Palliative care was consulted to help address goals.  SOCIAL HISTORY:     reports that she quit smoking about 18 years ago. Her smoking use included cigarettes. She has a 30.00 pack-year smoking history. She has never used smokeless tobacco. She reports previous alcohol use. She reports that she does not use drugs.  Patient is widowed x3 years.  She lives at home alone with her cat.  She has two sons who live nearby.  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 Until recently, she was still working four hours per day when able.    ADVANCE DIRECTIVES:  Not on file  CODE STATUS: DNR  PAST MEDICAL HISTORY: Past Medical History:  Diagnosis Date  . Anxiety   . Cervicalgia   . Chronic kidney disease   . Coronary artery disease    a. 02/2012 Stress echo: severe anterior wall ischemia;  b. 02/2012 Cath/PCI: LAD 95p (3.0 x 15 Xience EX DES), D1 90ost (PTCA - bifurcational dzs), EF 45% with anterior HK;  c. 02/2013 Ex MV: fixed anterior defect w/ minor reversibility, nl EF-->Med Rx; d. 11/2019 MV: EF 41%, ant/antlat ischemia-->high risk scan; e. 11/2019 Cath: LM nl, LAD 40ost/p, patent LAD stent, LCX nl, RCA nl, RPDA/RPAV nl, EF 30  . Endometrial cancer ([place])    a. 07/2016 s/p robotic hysterectomy, BSO w/ washings, sentinel node inj, mapping, bx, adhesiolysis.  . Essential hypertension, benign   . Fibrocystic breast disease   . GERD (gastroesophageal reflux disease)   . Gestational hypertension   . Heart murmur   . HFrEF (heart failure with reduced ejection fraction) ([place])    a. 05/2020 Echo: EF <20%, glob HK.  Marland Kitchen History of anemia   . History of blood transfusion   . Hodgkin's lymphoma ([place]) 2011   a. s/p radiation and chemo therapy  . Mixed Ischemic and Nonischemic Cardiomyopathy    a. 05/2020 Echo: EF <20%, glob HK, Nl RV size/fxn, mildly dil LA. Mild MR. Mild to mod Ao sclerosis.  . Osteoarthritis   . Polycystic ovarian disease     PAST SURGICAL HISTORY:  Past Surgical History:  Procedure Laterality Date  . ABDOMINAL HYSTERECTOMY    .  bladder sling    . CARDIAC CATHETERIZATION  02/2012   [facility] 1 stent place  . CERVICAL POLYPECTOMY    . CHOLECYSTECTOMY  1982  . COLONOSCOPY WITH PROPOFOL N/A 02/05/2015   Procedure: COLONOSCOPY WITH PROPOFOL;  Surgeon: Lucilla Lame, MD;  Location: [facility] ENDOSCOPY;  Service: Endoscopy;  Laterality: N/A;  . CORONARY ANGIOPLASTY  02/2012   left/right s/p balloon  . CYSTOGRAM N/A 08/17/2016   Procedure:  CYSTOGRAM;  Surgeon: Hollice Espy, MD;  Location: [facility] ORS;  Service: Urology;  Laterality: N/A;  . CYSTOSCOPY N/A 08/17/2016   Procedure: CYSTOSCOPY EXAM UNDER ANESTHESIA;  Surgeon: Hollice Espy, MD;  Location: [facility] ORS;  Service: Urology;  Laterality: N/A;  . CYSTOSCOPY W/ RETROGRADES Bilateral 08/17/2016   Procedure: CYSTOSCOPY WITH RETROGRADE PYELOGRAM;  Surgeon: Hollice Espy, MD;  Location: [facility] ORS;  Service: Urology;  Laterality: Bilateral;  . CYSTOSCOPY WITH STENT PLACEMENT Right 08/17/2016   Procedure: CYSTOSCOPY WITH STENT PLACEMENT;  Surgeon: Hollice Espy, MD;  Location: [facility] ORS;  Service: Urology;  Laterality: Right;  . heart stent'  2013  . kidney stent Right 2018  . LYMPH NODE BIOPSY  2011   diagnosis of hodgkins lymphoma  . PELVIC LYMPH NODE DISSECTION N/A 07/29/2016   Procedure: PELVIC/AORTIC LYMPH NODE SAMPLING;  Surgeon: [name], MD;  Location: [facility] ORS;  Service: Gynecology;  Laterality: N/A;  . PORTA CATH INSERTION N/A 09/22/2016   Procedure: Glori Luis Cath Insertion;  Surgeon: [name], MD;  Location: [place] CV LAB;  Service: Cardiovascular;  Laterality: N/A;  . PORTA CATH INSERTION N/A 10/27/2019   Procedure: PORTA CATH INSERTION;  Surgeon: [name], MD;  Location: [place] CV LAB;  Service: Cardiovascular;  Laterality: N/A;  . PORTA CATH REMOVAL N/A 11/17/2016   Procedure: Glori Luis Cath Removal;  Surgeon: [name], MD;  Location: [place] [facility];  Service: Cardiovascular;  Laterality: N/A;  . RIGHT/LEFT HEART CATH AND CORONARY ANGIOGRAPHY N/A 12/04/2019   Procedure: RIGHT/LEFT HEART CATH AND CORONARY ANGIOGRAPHY;  Surgeon: Wellington Hampshire, MD;  Location: [place] [facility] LAB;  Service: Cardiovascular;  Laterality: N/A;  . ROBOTIC ASSISTED TOTAL HYSTERECTOMY WITH BILATERAL SALPINGO OOPHERECTOMY N/A 07/29/2016   Procedure: ROBOTIC ASSISTED TOTAL HYSTERECTOMY WITH BILATERAL SALPINGO OOPHORECTOMY;  Surgeon: [name], MD;  Location: [facility] ORS;  Service: Gynecology;  Laterality: N/A;  . SENTINEL NODE BIOPSY N/A 07/29/2016   Procedure: SENTINEL NODE BIOPSY;  Surgeon: [name], MD;  Location: [facility] ORS;  Service: Gynecology;  Laterality: N/A;  . transobturator sling N/A 2009   [place]    HEMATOLOGY/ONCOLOGY HISTORY:  Oncology History  Endometrial cancer ([place])  08/13/2016 Initial Diagnosis   Endometrial cancer ([place])   11/06/2019 - 12/28/2019 Chemotherapy   The patient had palonosetron (ALOXI) injection 0.25 mg, 0.25 mg, Intravenous,  Once, 3 of 4 cycles Administration: 0.25 mg (11/14/2019), 0.25 mg (12/07/2019), 0.25 mg (12/14/2019), 0.25 mg (12/28/2019) pegfilgrastim (NEULASTA ONPRO KIT) injection 6 mg, 6 mg, Subcutaneous, Once, 3 of 4 cycles Administration: 6 mg (11/14/2019), 6 mg (12/14/2019) CARBOplatin (PARAPLATIN) 140 mg in sodium chloride 0.9 % 100 mL chemo infusion, 140 mg (100 % of original dose 141 mg), Intravenous,  Once, 3 of 4 cycles Dose modification:   (original dose 141 mg, Cycle 1) Administration: 140 mg (11/06/2019), 110 mg (12/07/2019), 150 mg (11/14/2019), 110 mg (12/14/2019), 110 mg (12/28/2019) gemcitabine (GEMZAR) 1,400 mg in sodium chloride 0.9 % 250 mL chemo infusion, 1,444 mg (100 % of original dose 800 mg/m2), Intravenous,  Once, 3 of 4 cycles Dose modification: 800 mg/m2 (original dose 800 mg/m2, Cycle 1, Reason: Patient Age) Administration: 1,400 mg (11/06/2019), 1,400 mg (11/14/2019), 1,400 mg (12/07/2019), 1,400 mg (12/14/2019), 1,400 mg (12/28/2019)  for chemotherapy treatment.    01/11/2020 -  Chemotherapy   The patient had lenvatinib 14 mg daily dose (LENVIMA, 14 MG DAILY DOSE,) 10 & 4 MG capsule, 14 mg, Oral, Daily, 1 of 1 cycle, Start date: --, End date: -- pembrolizumab (KEYTRUDA) 200 mg in sodium chloride 0.9 % 50 mL chemo infusion, 200 mg, Intravenous, Once, 5 of 6 cycles Administration: 200 mg (01/11/2020), 200 mg (04/05/2020), 200 mg (04/26/2020), 200 mg (02/01/2020), 200  mg (05/17/2020)  for chemotherapy treatment.    Peritoneal metastases ([place])  10/16/2019 Initial Diagnosis   Peritoneal metastases ([place])   11/06/2019 - 12/28/2019 Chemotherapy   The patient had palonosetron (ALOXI) injection 0.25 mg, 0.25 mg, Intravenous,  Once, 3 of 4 cycles Administration: 0.25 mg (11/14/2019), 0.25 mg (12/07/2019), 0.25 mg (12/14/2019), 0.25 mg (12/28/2019) pegfilgrastim (NEULASTA ONPRO KIT) injection 6 mg, 6 mg, Subcutaneous, Once, 3 of 4 cycles Administration: 6 mg (11/14/2019), 6 mg (12/14/2019) CARBOplatin (PARAPLATIN) 140 mg in sodium chloride 0.9 % 100 mL chemo infusion, 140 mg (100 % of original dose 141 mg), Intravenous,  Once, 3 of 4 cycles Dose modification:   (original dose 141 mg, Cycle 1) Administration: 140 mg (11/06/2019), 110 mg (12/07/2019), 150 mg (11/14/2019), 110 mg (12/14/2019), 110 mg (12/28/2019) gemcitabine (GEMZAR) 1,400 mg in sodium chloride 0.9 % 250 mL chemo infusion, 1,444 mg (100 % of original dose 800 mg/m2), Intravenous,  Once, 3 of 4 cycles Dose modification: 800 mg/m2 (original dose 800 mg/m2, Cycle 1, Reason: Patient Age) Administration: 1,400 mg (11/06/2019), 1,400 mg (11/14/2019), 1,400 mg (12/07/2019), 1,400 mg (12/14/2019), 1,400 mg (12/28/2019)  for chemotherapy treatment.    01/11/2020 -  Chemotherapy   The patient had lenvatinib 14 mg daily dose (LENVIMA, 14 MG DAILY DOSE,) 10 & 4 MG capsule, 14 mg, Oral, Daily, 1 of 1 cycle, Start date: --, End date: -- pembrolizumab (KEYTRUDA) 200 mg in sodium chloride 0.9 % 50 mL chemo infusion, 200 mg, Intravenous, Once, 5 of 6 cycles Administration: 200 mg (01/11/2020), 200 mg (04/05/2020), 200 mg (04/26/2020), 200 mg (02/01/2020), 200 mg (05/17/2020)  for chemotherapy treatment.      ALLERGIES:  is allergic to metoprolol, adhesive [tape], antifungal [miconazole nitrate], sulfa antibiotics, and z-pak [azithromycin].  MEDICATIONS:  Current Outpatient Medications  Medication Sig Dispense Refill  . amiodarone (PACERONE)  200 MG tablet Take 1 tablet (200 mg total) by mouth daily. 30 tablet 1  . amitriptyline (ELAVIL) 25 MG tablet Take 1 tablet (25 mg total) by mouth at bedtime. May increase weekly as needed 90 tablet 3  . apixaban (ELIQUIS) 2.5 MG TABS tablet Take 1 tablet (2.5 mg total) by mouth 2 (two) times daily. 60 tablet 1  . atorvastatin (LIPITOR) 10 MG tablet Take 1 tablet (10 mg total) by mouth daily. 90 tablet 0  . cholecalciferol (VITAMIN D3) 25 MCG (1000 UNIT) tablet Take 1,000 Units by mouth in the morning and at bedtime.    Marland Kitchen CRANBERRY PO Take 1 tablet by mouth daily.    . cyanocobalamin (,VITAMIN B-12,) 1000 MCG/ML injection Inject 1 mL (1,000 mcg total) into the muscle every 14 (fourteen) days. 2 mL 11  . empagliflozin (JARDIANCE) 10 MG TABS tablet Take 1 tablet (10 mg total) by mouth daily. 30 tablet 5  . furosemide (LASIX) 20 MG tablet  Take 1 tablet (20 mg total) by mouth as needed for fluid or edema. 90 tablet 0  . ivabradine (CORLANOR) 5 MG TABS tablet Take 1 tablet (5 mg total) by mouth 2 (two) times daily with a meal. 60 tablet 5  . levothyroxine (SYNTHROID) 75 MCG tablet Take 1 tablet (75 mcg total) by mouth daily before breakfast. 30 tablet 2  . LORazepam (ATIVAN) 0.5 MG tablet Take 1 tablet (0.5 mg total) by mouth every 6 (six) hours as needed for anxiety (and to help with breathing). (Patient not taking: Reported on 08/22/2020) 120 tablet 0  . Morphine Sulfate (MORPHINE CONCENTRATE) 10 mg / 0.5 ml concentrated solution Take 0.25 mLs (5 mg total) by mouth every 6 (six) hours as needed for severe pain or shortness of breath. 30 mL 0  . oxyCODONE (OXY IR/ROXICODONE) 5 MG immediate release tablet Take 5 mg by mouth every 4 (four) hours as needed for severe pain.    . pantoprazole (PROTONIX) 20 MG tablet Take 1 tablet (20 mg total) by mouth daily. 90 tablet 1  . potassium chloride (KLOR-CON) 10 MEQ tablet Take 1 tablet (10 mEq total) by mouth daily as needed (Take on days that you take the Lasix.).  90 tablet 0   No current facility-administered medications for this visit.   Facility-Administered Medications Ordered in Other Visits  Medication Dose Route Frequency Provider Last Rate Last Admin  . heparin lock flush 100 unit/mL  500 Units Intravenous Once Sindy Guadeloupe, MD      . sodium chloride flush (NS) 0.9 % injection 10 mL  10 mL Intravenous PRN Sindy Guadeloupe, MD   10 mL at 12/07/19 0914  . sodium chloride flush (NS) 0.9 % injection 10 mL  10 mL Intravenous PRN Sindy Guadeloupe, MD   10 mL at 01/04/20 0850    VITAL SIGNS: BP (!) 103/53   Pulse 81   Temp 98 F (36.7 C)   Resp 18   Wt 138 lb 11.2 oz (62.9 kg)   BMI 26.21 kg/m  Filed Weights   08/22/20 1120  Weight: 138 lb 11.2 oz (62.9 kg)    Estimated body mass index is 26.21 kg/m as calculated from the following:   Height as of 08/12/20: '5\' 1"'  (1.549 m).   Weight as of this encounter: 138 lb 11.2 oz (62.9 kg).  LABS: CBC:    Component Value Date/Time   WBC 22.6 (H) 08/22/2020 1004   HGB 10.3 (L) 08/22/2020 1004   HGB 13.4 03/27/2014 1214   HCT 33.2 (L) 08/22/2020 1004   HCT 41.8 03/27/2014 1214   PLT 258 08/22/2020 1004   PLT 220 03/27/2014 1214   MCV 88.5 08/22/2020 1004   MCV 92 03/27/2014 1214   NEUTROABS 20.0 (H) 08/22/2020 1004   NEUTROABS 3,274 02/15/2019 0000   NEUTROABS 4.9 03/27/2014 1214   LYMPHSABS 1.2 08/22/2020 1004   LYMPHSABS 1.4 03/27/2014 1214   MONOABS 0.7 08/22/2020 1004   MONOABS 0.6 03/27/2014 1214   EOSABS 0.4 08/22/2020 1004   EOSABS 0.1 03/27/2014 1214   BASOSABS 0.1 08/22/2020 1004   BASOSABS 0.1 03/27/2014 1214   Comprehensive Metabolic Panel:    Component Value Date/Time   NA 140 08/22/2020 1004   NA 140 02/15/2019 0000   NA 142 03/27/2014 1214   K 5.2 (H) 08/22/2020 1004   K 4.6 03/27/2014 1214   CL 107 08/22/2020 1004   CL 107 03/27/2014 1214   CO2 23 08/22/2020 1004  CO2 33 (H) 03/27/2014 1214   BUN 23 08/22/2020 1004   BUN 19 02/15/2019 0000   BUN 14  03/27/2014 1214   CREATININE 1.70 (H) 08/22/2020 1004   CREATININE 1.04 03/27/2014 1214   GLUCOSE 143 (H) 08/22/2020 1004   GLUCOSE 77 03/27/2014 1214   CALCIUM 9.4 08/22/2020 1004   CALCIUM 8.7 03/27/2014 1214   AST 41 08/22/2020 1004   AST 17 03/27/2014 1214   ALT 19 08/22/2020 1004   ALT 22 03/27/2014 1214   ALKPHOS 227 (H) 08/22/2020 1004   ALKPHOS 81 03/27/2014 1214   BILITOT 0.9 08/22/2020 1004   BILITOT 1.1 (H) 03/27/2014 1214   PROT 7.0 08/22/2020 1004   PROT 6.9 03/27/2014 1214   ALBUMIN 3.0 (L) 08/22/2020 1004   ALBUMIN 3.6 03/27/2014 1214    RADIOGRAPHIC STUDIES: DG Chest Port 1 View  Result Date: 08/12/2020 CLINICAL DATA:  Possible sepsis EXAM: PORTABLE CHEST 1 VIEW COMPARISON:  07/19/2020 FINDINGS: Heart size within normal limits. No pulmonary vascular congestion. Right chest port unchanged in position. Para-mediastinal pulmonary fibrotic changes better seen on prior CT exam. No focal airspace opacity to indicate pneumonia. IMPRESSION: No acute cardiopulmonary process. Electronically Signed   By: Miachel Roux M.D.   On: 08/12/2020 14:02    PERFORMANCE STATUS (ECOG) : 3 - Symptomatic, >50% confined to bed  Review of Systems Unless otherwise noted, a complete review of systems is negative.  Physical Exam General: NAD Pulmonary: Unlabored Skin: no rashes Neurological: Weakness but otherwise nonfocal  IMPRESSION: Routine follow-up visit.  Patient was recently referred to hospice but declined services due to several of her cardiac medications not being covered by the hospice formulary.  Is trying to get all of these medications covered by the pharmaceutical manufacturer.  It is possible if she can get a several month supply and she could be enrolled in hospice without worry of medication cost.  For now, she is comfortable not having hospice care and continuing follow-up in the clinic.  Patient saw Dr. [name] yesterday.  She continues to have chronic bleeding.   Patient has not felt to be a candidate for more XRT.  Embolization could be considered as an option.  Patient does not want to stop her Eliquis as her immediate goal is to live into her son's 50th birthday in May.  PLAN: -Best supportive care -Recommend hospice at home at time of clinical decline -Follow-up MyChart visit 2 to 3 weeks  Case and plan discussed with Dr. Janese Banks   Patient expressed understanding and was in agreement with this plan. She also understands that She can call the clinic at any time with any questions, concerns, or complaints.     Time Total: 25 minutes  Visit consisted of counseling and education dealing with the complex and emotionally intense issues of symptom management and palliative care in the setting of serious and potentially life-threatening illness.Greater than 50%  of this time was spent counseling and coordinating care related to the above assessment and plan.  Signed by: Altha Harm, PhD, NP-C

## 2020-08-22 NOTE — Telephone Encounter (Signed)
Spoke with Dr. Baruch Gouty this am regarding further radiation. He does not advise any further radiation with her history of WPRT and vaginal brachy. Ms. Braman was in clinic and updated. She reported she did not want to receive any further radiation. She is aware that embolization may still be an option if bleeding worsens. She will keep [place] updated.

## 2020-08-22 NOTE — Progress Notes (Unsigned)
Pt still has bleeding on and off. She has pain with her stomach- today 7. Sometimes at night if the pain comes she will take oxycodone.

## 2020-08-23 ENCOUNTER — Telehealth: Payer: Self-pay | Admitting: Cardiovascular Disease

## 2020-08-23 NOTE — Telephone Encounter (Signed)
Spoke to pt. Notified that I received both applications for Eliquis and Jardiance and her additional W2 information.  Notified that I have faxed completed applications to: [employer] (Eliquis) to [phone] and [employer] (Jardiance) to [phone]. Pt appreciative and has no further questions at this time.  Applications placed in designated file for pt assistance forms.

## 2020-08-23 NOTE — Telephone Encounter (Signed)
Patient calling to submit income   Information for medication assistance.  Unable to upload via mychart .  Emailed .  Printed forms. Placed in nurse box for review.

## 2020-08-23 NOTE — Progress Notes (Signed)
Hematology/Oncology Consult note [facility]  [phone] [phone]  Patient Care Team: [name], MD as PCP - General (Internal Medicine) Wellington Hampshire, MD as PCP - Cardiology (Cardiology) [name], MD as Consulting Physician (General Surgery) Mellody Drown, MD as Referring Physician (Obstetrics and Gynecology) [name], MD as Referring Physician (Obstetrics and Gynecology) Hollice Espy, MD as Consulting Physician (Urology) [name], RN as Registered Nurse Sindy Guadeloupe, MD as Consulting Physician (Oncology) De Hollingshead, RPH-CPP (Pharmacist) Leona Singleton, RN as Case Manager   Name of the patient: Jaime Hall  [id number]  [birth year]   Date of visit: 08/23/20  Diagnosis- 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 She underwent robotic hysterectomy with bilateral salpingo-oophorectomy with washings and sentinel lymph node injection mapping and biopsy in February 2018. She had multiple postoperative complications and completed 3 cycles of carbotaxol chemotherapy in May 2018. Treatment was complicated by chemo-induced peripheral neuropathy for which Taxol dose was reduced by 25%. She completed 5 weeks of external beam whole pelvic radiation on August 2018.Her original tumor in 2018 was ER +1%. PR negative. MSI stable and HER-2 negative  She was under clinical surveillance and recently presented to [name] with symptoms of right lower quadrant abdominal pain which led to aCT abdomen which showed new peritoneal metastatic disease throughout the right abdomen and pelvis with  index masses measuring 5.7 x 4.7 cm. Head CT scan also showed evidence of hypermetabolic abdominal pelvic peritoneal metastases but no evidence of other sites of metastases.  Repeat omental biopsy confirms high grade serous carcinoma.Foundation 1 testing showed following alteration/biomarkers. Microsatellite status stable. EPHB4amplification, ESR 1 amplification, HNF1 A G 292 FS 25, pik3r1 loss PARP1 amplification3 ER 1 loss, PPP 2R1 AAS 256F, PTEN loss, T p53. However none of these mutations have actionable targets.  Repeat echocardiogram showed EF of 30 to 35%. Cardiac cath unremarkable consistent with nonischemic cardiomyopathy.Therefore Doxil not chosen and she would be getting carboplatin and gemcitabine. Chemotherapy complicated by significant anemia causing supply demand cardiac ischemia and hospitalization.Patient switched to [facility] and lenvatinib starting 01/11/2020  Patient received two cycles of Keytruda but treatment washeldsince August 2021 due to repeated hospitalizations for possible heart failure followed by diagnosis of PE.   Interval history- Patient was recently hospitalized for sepsis possibly secondary to UTI and discharged on 08/15/2020.  She has not been able to enroll in hospice due to her cardiac medications and her desire to get hospitalized in the future should she experience palpitations or worsening shortness of breath.  She is experiencing intermittent vaginal bleeding which she says has not been excessive.  She has been using sanitary pads for the same.  Breathing is at her baseline and she is not currently using any oxygen.  Her sister-in-law lives with her and she continues to be independent of her ADLs.  ECOG PS- 1-2 Pain scale- 0   Review of systems- Review of Systems  Constitutional: Positive for malaise/fatigue. Negative for chills, fever and weight loss.  HENT: Negative for congestion, ear discharge and nosebleeds.   Eyes: Negative for blurred  vision.  Respiratory: Negative for cough, hemoptysis, sputum production, shortness of breath and wheezing.   Cardiovascular: Negative for chest  pain, palpitations, orthopnea and claudication.  Gastrointestinal: Negative for abdominal pain, blood in stool, constipation, diarrhea, heartburn, melena, nausea and vomiting.  Genitourinary: Negative for dysuria, flank pain, frequency, hematuria and urgency.       Vaginal bleeding  Musculoskeletal: Negative for back pain, joint pain and myalgias.  Skin: Negative for rash.  Neurological: Negative for dizziness, tingling, focal weakness, seizures, weakness and headaches.  Endo/Heme/Allergies: Does not bruise/bleed easily.  Psychiatric/Behavioral: Negative for depression and suicidal ideas. The patient does not have insomnia.        Allergies  Allergen Reactions  . Metoprolol Other (See Comments)    Fatigue  . Adhesive [Tape] Other (See Comments)    Skin Irritation   . Antifungal [Miconazole Nitrate] Rash  . Sulfa Antibiotics Nausea And Vomiting and Rash  . Z-Pak [Azithromycin] Itching     Past Medical History:  Diagnosis Date  . Anxiety   . Cervicalgia   . Chronic kidney disease   . Coronary artery disease    a. 02/2012 Stress echo: severe anterior wall ischemia;  b. 02/2012 Cath/PCI: LAD 95p (3.0 x 15 Xience EX DES), D1 90ost (PTCA - bifurcational dzs), EF 45% with anterior HK;  c. 02/2013 Ex MV: fixed anterior defect w/ minor reversibility, nl EF-->Med Rx; d. 11/2019 MV: EF 41%, ant/antlat ischemia-->high risk scan; e. 11/2019 Cath: LM nl, LAD 40ost/p, patent LAD stent, LCX nl, RCA nl, RPDA/RPAV nl, EF 30  . Endometrial cancer ([place])    a. 07/2016 s/p robotic hysterectomy, BSO w/ washings, sentinel node inj, mapping, bx, adhesiolysis.  . Essential hypertension, benign   . Fibrocystic breast disease   . GERD (gastroesophageal reflux disease)   . Gestational hypertension   . Heart murmur   . HFrEF (heart failure with reduced ejection  fraction) ([place])    a. 05/2020 Echo: EF <20%, glob HK.  Marland Kitchen History of anemia   . History of blood transfusion   . Hodgkin's lymphoma ([place]) 2011   a. s/p radiation and chemo therapy  . Mixed Ischemic and Nonischemic Cardiomyopathy    a. 05/2020 Echo: EF <20%, glob HK, Nl RV size/fxn, mildly dil LA. Mild MR. Mild to mod Ao sclerosis.  . Osteoarthritis   . Polycystic ovarian disease      Past Surgical History:  Procedure Laterality Date  . ABDOMINAL HYSTERECTOMY    . bladder sling    . CARDIAC CATHETERIZATION  02/2012   [facility] 1 stent place  . CERVICAL POLYPECTOMY    . CHOLECYSTECTOMY  1982  . COLONOSCOPY WITH PROPOFOL N/A 02/05/2015   Procedure: COLONOSCOPY WITH PROPOFOL;  Surgeon: Lucilla Lame, MD;  Location: [facility] ENDOSCOPY;  Service: Endoscopy;  Laterality: N/A;  . CORONARY ANGIOPLASTY  02/2012   left/right s/p balloon  . CYSTOGRAM N/A 08/17/2016   Procedure: CYSTOGRAM;  Surgeon: Hollice Espy, MD;  Location: [facility] ORS;  Service: Urology;  Laterality: N/A;  . CYSTOSCOPY N/A 08/17/2016   Procedure: CYSTOSCOPY EXAM UNDER ANESTHESIA;  Surgeon: Hollice Espy, MD;  Location: [facility] ORS;  Service: Urology;  Laterality: N/A;  . CYSTOSCOPY W/ RETROGRADES Bilateral 08/17/2016   Procedure: CYSTOSCOPY WITH RETROGRADE PYELOGRAM;  Surgeon: Hollice Espy, MD;  Location: [facility] ORS;  Service: Urology;  Laterality: Bilateral;  . CYSTOSCOPY WITH STENT PLACEMENT Right 08/17/2016   Procedure: CYSTOSCOPY WITH STENT PLACEMENT;  Surgeon: Hollice Espy, MD;  Location: [facility] ORS;  Service: Urology;  Laterality: Right;  . heart stent'  2013  . kidney stent Right 2018  . LYMPH NODE BIOPSY  2011  diagnosis of hodgkins lymphoma  . PELVIC LYMPH NODE DISSECTION N/A 07/29/2016   Procedure: PELVIC/AORTIC LYMPH NODE SAMPLING;  Surgeon: [name], MD;  Location: [facility] ORS;  Service: Gynecology;  Laterality: N/A;  . PORTA CATH INSERTION N/A 09/22/2016   Procedure: Glori Luis Cath Insertion;  Surgeon: [name], MD;   Location: [place] [facility];  Service: Cardiovascular;  Laterality: N/A;  . PORTA CATH INSERTION N/A 10/27/2019   Procedure: PORTA CATH INSERTION;  Surgeon: [name], MD;  Location: [place] [facility];  Service: Cardiovascular;  Laterality: N/A;  . PORTA CATH REMOVAL N/A 11/17/2016   Procedure: Glori Luis Cath Removal;  Surgeon: [name], MD;  Location: [place] [facility];  Service: Cardiovascular;  Laterality: N/A;  . RIGHT/LEFT HEART CATH AND CORONARY ANGIOGRAPHY N/A 12/04/2019   Procedure: RIGHT/LEFT HEART CATH AND CORONARY ANGIOGRAPHY;  Surgeon: Wellington Hampshire, MD;  Location: [place] [facility];  Service: Cardiovascular;  Laterality: N/A;  . ROBOTIC ASSISTED TOTAL HYSTERECTOMY WITH BILATERAL SALPINGO OOPHERECTOMY N/A 07/29/2016   Procedure: ROBOTIC ASSISTED TOTAL HYSTERECTOMY WITH BILATERAL SALPINGO OOPHORECTOMY;  Surgeon: [name], MD;  Location: [facility] ORS;  Service: Gynecology;  Laterality: N/A;  . SENTINEL NODE BIOPSY N/A 07/29/2016   Procedure: SENTINEL NODE BIOPSY;  Surgeon: [name], MD;  Location: [facility] ORS;  Service: Gynecology;  Laterality: N/A;  . transobturator sling N/A 2009   [place]    Social History   Socioeconomic History  . Marital status: Widowed    Spouse name: Not on file  . Number of children: Not on file  . Years of education: Not on file  . Highest education level: Not on file  Occupational History  . Not on file  Tobacco Use  . Smoking status: Former Smoker    Packs/day: 1.00    Years: 30.00    Pack years: 30.00    Types: Cigarettes    Quit date: 07/24/2002    Years since quitting: 18.0  . Smokeless tobacco: Never Used  . Tobacco comment: quit smoking in 2000  Vaping Use  . Vaping Use: Never used  Substance and Sexual Activity  . Alcohol use: Not Currently  . Drug use: No  . Sexual activity: Never  Other Topics Concern  . Not on file  Social History Narrative   She works in [employer] at  school, bowls one night a week, and push mows the lawn.    Social Determinants of Health   Financial Resource Strain: Medium Risk  . Difficulty of Paying Living Expenses: Somewhat hard  Food Insecurity: No Food Insecurity  . Worried About Charity fundraiser in the Last Year: Never true  . Ran Out of Food in the Last Year: Never true  Transportation Needs: No Transportation Needs  . Lack of Transportation (Medical): No  . Lack of Transportation (Non-Medical): No  Physical Activity: Unknown  . Days of Exercise per Week: 0 days  . Minutes of Exercise per Session: Not on file  Stress: No Stress Concern Present  . Feeling of Stress : Only a little  Social Connections: Unknown  . Frequency of Communication with Friends and Family: More than three times a week  . Frequency of Social Gatherings with Friends and Family: More than three times a week  . Attends Religious Services: Not on file  . Active Member of Clubs or Organizations: Not on file  . Attends Archivist Meetings: Not on file  . Marital Status: Not on  file  Intimate Partner Violence: Not At Risk  . Fear of Current or Ex-Partner: No  . Emotionally Abused: No  . Physically Abused: No  . Sexually Abused: No    Family History  Problem Relation Age of Onset  . ALS Father   . Polymyositis Father   . Diabetes Brother   . Dementia Mother   . Cancer Maternal Aunt        breast  . Breast cancer Maternal Aunt        30's  . Stroke Maternal Grandmother   . Cancer Maternal Grandfather        prostate  . Stroke Maternal Grandfather   . Colon cancer Maternal Aunt   . Non-Hodgkin's lymphoma Cousin      Current Outpatient Medications:  .  amiodarone (PACERONE) 200 MG tablet, Take 1 tablet (200 mg total) by mouth daily., Disp: 30 tablet, Rfl: 1 .  amitriptyline (ELAVIL) 25 MG tablet, Take 1 tablet (25 mg total) by mouth at bedtime. May increase weekly as needed, Disp: 90 tablet, Rfl: 3 .  apixaban (ELIQUIS) 2.5 MG  TABS tablet, Take 1 tablet (2.5 mg total) by mouth 2 (two) times daily., Disp: 60 tablet, Rfl: 1 .  atorvastatin (LIPITOR) 10 MG tablet, Take 1 tablet (10 mg total) by mouth daily., Disp: 90 tablet, Rfl: 0 .  cholecalciferol (VITAMIN D3) 25 MCG (1000 UNIT) tablet, Take 1,000 Units by mouth in the morning and at bedtime., Disp: , Rfl:  .  CRANBERRY PO, Take 1 tablet by mouth daily., Disp: , Rfl:  .  cyanocobalamin (,VITAMIN B-12,) 1000 MCG/ML injection, Inject 1 mL (1,000 mcg total) into the muscle every 14 (fourteen) days., Disp: 2 mL, Rfl: 11 .  empagliflozin (JARDIANCE) 10 MG TABS tablet, Take 1 tablet (10 mg total) by mouth daily., Disp: 30 tablet, Rfl: 5 .  furosemide (LASIX) 20 MG tablet, Take 1 tablet (20 mg total) by mouth as needed for fluid or edema., Disp: 90 tablet, Rfl: 0 .  ivabradine (CORLANOR) 5 MG TABS tablet, Take 1 tablet (5 mg total) by mouth 2 (two) times daily with a meal., Disp: 60 tablet, Rfl: 5 .  levothyroxine (SYNTHROID) 75 MCG tablet, Take 1 tablet (75 mcg total) by mouth daily before breakfast., Disp: 30 tablet, Rfl: 2 .  Morphine Sulfate (MORPHINE CONCENTRATE) 10 mg / 0.5 ml concentrated solution, Take 0.25 mLs (5 mg total) by mouth every 6 (six) hours as needed for severe pain or shortness of breath., Disp: 30 mL, Rfl: 0 .  pantoprazole (PROTONIX) 20 MG tablet, Take 1 tablet (20 mg total) by mouth daily., Disp: 90 tablet, Rfl: 1 .  potassium chloride (KLOR-CON) 10 MEQ tablet, Take 1 tablet (10 mEq total) by mouth daily as needed (Take on days that you take the Lasix.)., Disp: 90 tablet, Rfl: 0 .  LORazepam (ATIVAN) 0.5 MG tablet, Take 1 tablet (0.5 mg total) by mouth every 6 (six) hours as needed for anxiety (and to help with breathing). (Patient not taking: Reported on 08/22/2020), Disp: 120 tablet, Rfl: 0 .  oxyCODONE (OXY IR/ROXICODONE) 5 MG immediate release tablet, Take 1 tablet (5 mg total) by mouth every 4 (four) hours as needed for severe pain., Disp: 30 tablet, Rfl:  0 No current facility-administered medications for this visit.  Facility-Administered Medications Ordered in Other Visits:  .  heparin lock flush 100 unit/mL, 500 Units, Intravenous, Once, Randa Evens C, MD .  sodium chloride flush (NS) 0.9 % injection 10 mL, 10  mL, Intravenous, PRN, Sindy Guadeloupe, MD, 10 mL at 12/07/19 0914 .  sodium chloride flush (NS) 0.9 % injection 10 mL, 10 mL, Intravenous, PRN, Sindy Guadeloupe, MD, 10 mL at 01/04/20 0850  Physical exam:  Vitals:   08/22/20 1051 08/22/20 1056  BP: (!) 103/53   Pulse: 81   Resp: 16   Temp: 98 F (36.7 C)   Weight: 138 lb (62.6 kg) 138 lb 11.2 oz (62.9 kg)   Physical Exam Cardiovascular:     Rate and Rhythm: Normal rate and regular rhythm.     Heart sounds: Normal heart sounds.  Pulmonary:     Effort: Pulmonary effort is normal.  Skin:    General: Skin is warm and dry.  Neurological:     Mental Status: She is alert and oriented to person, place, and time.      CMP Latest Ref Rng & Units 08/22/2020  Glucose 70 - 99 mg/dL 143(H)  BUN 8 - 23 mg/dL 23  Creatinine 0.44 - 1.00 mg/dL 1.70(H)  Sodium 135 - 145 mmol/L 140  Potassium 3.5 - 5.1 mmol/L 5.2(H)  Chloride 98 - 111 mmol/L 107  CO2 22 - 32 mmol/L 23  Calcium 8.9 - 10.3 mg/dL 9.4  Total Protein 6.5 - 8.1 g/dL 7.0  Total Bilirubin 0.3 - 1.2 mg/dL 0.9  Alkaline Phos 38 - 126 U/L 227(H)  AST 15 - 41 U/L 41  ALT 0 - 44 U/L 19   CBC Latest Ref Rng & Units 08/22/2020  WBC 4.0 - 10.5 K/uL 22.6(H)  Hemoglobin 12.0 - 15.0 g/dL 10.3(L)  Hematocrit 36.0 - 46.0 % 33.2(L)  Platelets 150 - 400 K/uL 258    No images are attached to the encounter.  DG Chest Port 1 View  Result Date: 08/12/2020 CLINICAL DATA:  Possible sepsis EXAM: PORTABLE CHEST 1 VIEW COMPARISON:  07/19/2020 FINDINGS: Heart size within normal limits. No pulmonary vascular congestion. Right chest port unchanged in position. Para-mediastinal pulmonary fibrotic changes better seen on prior CT exam. No focal  airspace opacity to indicate pneumonia. IMPRESSION: No acute cardiopulmonary process. Electronically Signed   By: Miachel Roux M.D.   On: 08/12/2020 14:02     Assessment and plan- Patient is a 70 y.o. female with recurrent metastatic high-grade endometrial carcinoma with progression on multiple lines of treatment and currently on the best supportive care here for routine follow-up  Patient has been off all treatment now since December 2021.  Her performance status is remained overall stable and she continues to live by herself for the most part and independent of her ADLs.  She has been hospitalized at least once a month for her cardiac issues and more recently for UTI.  With regards to her vaginal bleeding, her hemoglobin is currently stable between 9-10.  She also had a history of PE in September 2021 and I would therefore like her to remain on Eliquis as long as the bleeding is not significant.  Embolization for vaginal bleeding remains an option down the line.  Unfortunately she is unable to enroll under hospice given that she has multiple cardiac meds which will not be covered.  Also patient desires to be hospitalized should she experience worsening shortness of breath or palpitations.  I will continue to see her once a month with labs and she will call us sooner if questions or concerns arise  Based on her recent labs she has worsening neutrophilia and her white cell count is up to 22 today with  an [place] of 20.  She denies any symptoms of worsening cough shortness of breath fever or symptoms of UTI.  She recently completed her course of antibiotics.Clinically she does not appear to have any signs and symptoms of sepsis and she is hemodynamically stable.  I will therefore continue to watch her off antibiotics at this time     Visit Diagnosis 1. Endometrial adenocarcinoma ([place])   2. Peritoneal carcinomatosis [facility])      Dr. Randa Evens, MD, MPH [facility] at [facility] [phone] 08/23/2020 7:51 AM

## 2020-08-27 DIAGNOSIS — J9601 Acute respiratory failure with hypoxia: Secondary | ICD-10-CM | POA: Diagnosis not present

## 2020-08-27 DIAGNOSIS — I509 Heart failure, unspecified: Secondary | ICD-10-CM | POA: Diagnosis not present

## 2020-08-28 ENCOUNTER — Other Ambulatory Visit: Payer: Self-pay | Admitting: *Deleted

## 2020-08-28 ENCOUNTER — Encounter: Payer: Self-pay | Admitting: Oncology

## 2020-08-29 ENCOUNTER — Encounter: Payer: Self-pay | Admitting: *Deleted

## 2020-08-29 ENCOUNTER — Other Ambulatory Visit: Payer: Self-pay | Admitting: *Deleted

## 2020-08-29 MED ORDER — OXYCODONE HCL 5 MG PO TABS: 5.0000 mg | ORAL_TABLET | ORAL | 0 refills | Status: DC | PRN

## 2020-09-03 NOTE — Progress Notes (Signed)
Patient appt was rescheduled

## 2020-09-05 ENCOUNTER — Ambulatory Visit: Payer: PPO | Admitting: Cardiovascular Disease

## 2020-09-09 ENCOUNTER — Other Ambulatory Visit: Payer: Self-pay | Admitting: Adult Health Nurse Practitioner

## 2020-09-10 ENCOUNTER — Encounter: Payer: Self-pay | Admitting: Hospice and Palliative Medicine

## 2020-09-10 ENCOUNTER — Inpatient Hospital Stay (HOSPITAL_BASED_OUTPATIENT_CLINIC_OR_DEPARTMENT_OTHER): Payer: PPO | Admitting: Hospice and Palliative Medicine

## 2020-09-10 DIAGNOSIS — C541 Malignant neoplasm of endometrium: Secondary | ICD-10-CM | POA: Diagnosis not present

## 2020-09-10 NOTE — Progress Notes (Signed)
Virtual Visit via Telephone Note  I connected with Jaime Hall on 09/10/20 at  2:15 PM EDT by telephone and verified that I am speaking with the correct person using two identifiers.  Location: Patient: home Provider: clinic   I discussed the limitations, risks, security and privacy concerns of performing an evaluation and management service by telephone and the availability of in person appointments. I also discussed with the patient that there may be a patient responsible charge related to this service. The patient expressed understanding and agreed to proceed.   History of Present Illness: Jaime Hall a 70 y.o.femalewith multiple medical problems including HFrEF (EF 35%) andmetastatic high-grade serous endometrial carcinoma status post XRT and chemotherapy now on active surveillance. Patient was originally diagnosed in 2018 with stage I endometrial cancer and underwent TAH/BSO and adjuvant chemotherapy but unfortunately was found to have disease recurrence in May 2021. Patient has had recurrent hospitalizations for tachycardia and CHF.  She is most recently hospitalized in February 2022 for UTI.  Patient was referred to hospice but was ultimately not admitted to their services due to high cost medications.Palliative care was consulted to help address goals.   Observations/Objective: I spoke with patient by phone.  She reports that she is doing about the same.  She denies any significant changes or concerns.  She still has bleeding occasionally but it is unchanged in frequency or severity.  She says that her performance status is stable.  Appetite is good.  We again discussed options for increasing home services such as hospice.  She does not think that that is needed at the present time.  Patient is scheduled to see Dr. Janese Banks next month.  Assessment and Plan: Stage IV endometrial carcinoma -on best supportive care.  Symptomatically, she seems to be doing about the same.  Plan  for follow-up in the cancer center next month.  Follow labs.  She will likely benefit from future hospice involvement.  Follow Up Instructions: RTC 1 month   I discussed the assessment and treatment plan with the patient. The patient was provided an opportunity to ask questions and all were answered. The patient agreed with the plan and demonstrated an understanding of the instructions.   The patient was advised to call back or seek an in-person evaluation if the symptoms worsen or if the condition fails to improve as anticipated.  I provided 5 minutes of non-face-to-face time during this encounter.   Irean Hong, NP

## 2020-09-11 ENCOUNTER — Ambulatory Visit: Payer: PPO | Admitting: Pharmacist

## 2020-09-11 DIAGNOSIS — C541 Malignant neoplasm of endometrium: Secondary | ICD-10-CM

## 2020-09-11 DIAGNOSIS — N1832 Chronic kidney disease, stage 3b: Secondary | ICD-10-CM

## 2020-09-11 DIAGNOSIS — E785 Hyperlipidemia, unspecified: Secondary | ICD-10-CM

## 2020-09-11 DIAGNOSIS — I5022 Chronic systolic (congestive) heart failure: Secondary | ICD-10-CM

## 2020-09-11 DIAGNOSIS — I10 Essential (primary) hypertension: Secondary | ICD-10-CM

## 2020-09-11 DIAGNOSIS — I2510 Atherosclerotic heart disease of native coronary artery without angina pectoris: Secondary | ICD-10-CM

## 2020-09-11 NOTE — Patient Instructions (Signed)
Visit Information  PATIENT GOALS: Goals Addressed              This Visit's Progress     Patient Stated   .  COMPLETED: Medication Monitoring (pt-stated)        Patient Goals/Self-Care Activities  Over the next 90 days, patient will:  - take medications as prescribed collaborate with provider on medication access solutions       The patient verbalized understanding of instructions, educational materials, and care plan provided today and declined offer to receive copy of patient instructions, educational materials, and care plan.   Plan: Goals of care met. Closing pharmacy involvement in CCM case at this time  [place], PharmD, [place], [place] [facility] [place]

## 2020-09-11 NOTE — Chronic Care Management (AMB) (Signed)
Chronic Care Management Pharmacy Note  09/11/2020 Name:  Jaime Hall MRN:  [id number] DOB:  June 24, 1950  Subjective: PA TENNANT is an 70 y.o. year old female who is a primary patient of Derrel Nip, Aris Everts, MD.  The CCM team was consulted for assistance with disease management and care coordination needs.    Engaged with patient by telephone for follow up visit in response to provider referral for pharmacy case management and/or care coordination services.   Consent to Services:  The patient was given information about Chronic Care Management services, agreed to services, and gave verbal consent prior to initiation of services.  Please see initial visit note for detailed documentation.   Patient Care Team: [name], MD as PCP - General (Internal Medicine) Wellington Hampshire, MD as PCP - Cardiology (Cardiology) [name], MD as Consulting Physician (General Surgery) Mellody Drown, MD as Referring Physician (Obstetrics and Gynecology) [name], MD as Referring Physician (Obstetrics and Gynecology) Hollice Espy, MD as Consulting Physician (Urology) [name], RN as Registered Nurse Sindy Guadeloupe, MD as Consulting Physician (Oncology) Leona Singleton, RN as Case Manager  Recent office visits: 3/9 - RN CM phone call  Recent consult visits: 3/9 - established with palliative care Hanley Ben, NP; Advanced Care Planning completed. Not a candidate for hospice yet due to continuation of cardiology medications 3/9 - Gyn Onc Dr. [name], consideration of holding anticoagulation. Shared decision to continue anticoagulation 3/10 - Dr. Janese Banks, Billey Chang, NP. Continue current regimen with monthly lab work at this time 3/29 - [place], NP. Continue regimen, follow up in 1 month.  Hospital visits: See last CCM note  Objective:  Lab Results  Component Value Date   CREATININE 1.70 (H) 08/22/2020   CREATININE 1.37 (H) 08/15/2020    CREATININE 1.65 (H) 08/14/2020    Lab Results  Component Value Date   HGBA1C 6.1 (H) 12/30/2019   Last diabetic Eye exam: No results found for: HMDIABEYEEXA  Last diabetic Foot exam: No results found for: HMDIABFOOTEX      Component Value Date/Time   CHOL 114 12/30/2019 0457   CHOL 110 04/11/2012 0835   TRIG 131 12/30/2019 0457   HDL 47 12/30/2019 0457   HDL 56 04/11/2012 0835   CHOLHDL 2.4 12/30/2019 0457   VLDL 26 12/30/2019 0457   LDLCALC 41 12/30/2019 0457   LDLCALC 41 04/11/2012 0835    Hepatic Function Latest Ref Rng & Units 08/22/2020 06/10/2020 05/19/2020  Total Protein 6.5 - 8.1 g/dL 7.0 7.0 6.8  Albumin 3.5 - 5.0 g/dL 3.0(L) 3.2(L) 3.3(L)  AST 15 - 41 U/L 41 20 22  ALT 0 - 44 U/L _0 Alk Phosphatase 38 - 126 U/L 227(H) 96 81  Total Bilirubin 0.3 - 1.2 mg/dL 0.9 0.5 1.1  Bilirubin, Direct 0.1 - 0.5 mg/dL - - -    Lab Results  Component Value Date/Time   TSH 2.091 07/19/2020 11:11 PM   TSH 0.637 05/17/2020 08:33 AM   TSH 11.200 (H) 02/20/2020 09:33 AM   TSH 4.20 04/05/2018 04:52 PM   FREET4 0.89 01/16/2020 09:02 AM    CBC Latest Ref Rng & Units 08/22/2020 08/15/2020 08/14/2020  WBC 4.0 - 10.5 K/uL 22.6(H) 17.3(H) 14.3(H)  Hemoglobin 12.0 - 15.0 g/dL 10.3(L) 9.0(L) 8.9(L)  Hematocrit 36.0 - 46.0 % 33.2(L) 29.0(L) 27.5(L)  Platelets 150 - 400 K/uL 258 212 214    Lab Results  Component Value Date/Time  VD25OH 59.00 10/04/2017 11:44 AM   VD25OH 78.88 11/20/2015 11:54 AM    Clinical ASCVD: Yes  The ASCVD Risk score Mikey Bussing DC Jr., et al., 2013) failed to calculate for the following reasons:   The patient has a prior MI or stroke diagnosis    Social History   Tobacco Use  Smoking Status Former Smoker  . Packs/day: 1.00  . Years: 30.00  . Pack years: 30.00  . Types: Cigarettes  . Quit date: 07/24/2002  . Years since quitting: 18.1  Smokeless Tobacco Never Used  Tobacco Comment   quit smoking in 2000   BP Readings from Last 3 Encounters:  08/22/20  (!) 103/53  08/22/20 (!) 103/53  08/21/20 (!) 113/51   Pulse Readings from Last 3 Encounters:  08/22/20 81  08/22/20 81  08/21/20 78   Wt Readings from Last 3 Encounters:  08/22/20 138 lb 11.2 oz (62.9 kg)  08/22/20 138 lb 11.2 oz (62.9 kg)  08/21/20 139 lb (63 kg)    Assessment: Review of patient past medical history, allergies, medications, health status, including review of consultants reports, laboratory and other test data, was performed as part of comprehensive evaluation and provision of chronic care management services.   SDOH:  (Social Determinants of Health) assessments and interventions performed: none today   CCM Care Plan  Allergies  Allergen Reactions  . Metoprolol Other (See Comments)    Fatigue  . Adhesive [Tape] Other (See Comments)    Skin Irritation   . Antifungal [Miconazole Nitrate] Rash  . Sulfa Antibiotics Nausea And Vomiting and Rash  . Z-Pak [Azithromycin] Itching    Medications Reviewed Today    Reviewed by Manus Rudd, RN (Registered Nurse) on 09/10/20 at 1228  Med List Status: <None>  Medication Order Taking? Sig Documenting Provider Last Dose Status Informant  amiodarone (PACERONE) 200 MG tablet [id number] Yes Take 1 tablet (200 mg total) by mouth daily. Rise Mu, PA-C Taking Active Self  amitriptyline (ELAVIL) 25 MG tablet [id number] Yes Take 1 tablet (25 mg total) by mouth at bedtime. May increase weekly as needed [name], MD Taking Active Self  apixaban (ELIQUIS) 2.5 MG TABS tablet [id number] Yes Take 1 tablet (2.5 mg total) by mouth 2 (two) times daily. Sindy Guadeloupe, MD Taking Active Self  atorvastatin (LIPITOR) 10 MG tablet [id number] Yes Take 1 tablet (10 mg total) by mouth daily. Wellington Hampshire, MD Taking Active Self  cholecalciferol (VITAMIN D3) 25 MCG (1000 UNIT) tablet [id number] Yes Take 1,000 Units by mouth in the morning and at bedtime. [provider] Taking Active Self  CRANBERRY PO [id number] Yes Take 1  tablet by mouth daily. [provider] Taking Active   cyanocobalamin (,VITAMIN B-12,) 1000 MCG/ML injection [id number] Yes Inject 1 mL (1,000 mcg total) into the muscle every 14 (fourteen) days. [name], MD Taking Active Self  empagliflozin (JARDIANCE) 10 MG TABS tablet [id number] Yes Take 1 tablet (10 mg total) by mouth daily. Loel Dubonnet, NP Taking Active Self  furosemide (LASIX) 20 MG tablet [id number] Yes Take 1 tablet (20 mg total) by mouth as needed for fluid or edema. Wellington Hampshire, MD Taking Active Self  heparin lock flush 100 unit/mL [id number]   Sindy Guadeloupe, MD  Active   ivabradine (CORLANOR) 5 MG TABS tablet [id number] Yes Take 1 tablet (5 mg total) by mouth 2 (two) times daily with a meal. Loel Dubonnet, NP Taking Active Self  levothyroxine (SYNTHROID) 75 MCG  tablet [id number] Yes Take 1 tablet (75 mcg total) by mouth daily before breakfast. Sindy Guadeloupe, MD Taking Active Self  LORazepam (ATIVAN) 0.5 MG tablet [id number] Yes Take 1 tablet (0.5 mg total) by mouth every 6 (six) hours as needed for anxiety (and to help with breathing). Sindy Guadeloupe, MD Taking Active   Morphine Sulfate (MORPHINE CONCENTRATE) 10 mg / 0.5 ml concentrated solution [id number] Yes Take 0.25 mLs (5 mg total) by mouth every 6 (six) hours as needed for severe pain or shortness of breath. Sindy Guadeloupe, MD Taking Active            Med Note Autumn Messing Jul 09, 2020  2:25 PM)    oxyCODONE (OXY IR/ROXICODONE) 5 MG immediate release tablet [id number] Yes Take 1 tablet (5 mg total) by mouth every 4 (four) hours as needed for severe pain. Sindy Guadeloupe, MD Taking Active   pantoprazole (PROTONIX) 20 MG tablet [id number] Yes Take 1 tablet (20 mg total) by mouth daily. [name], MD Taking Active Self  potassium chloride (KLOR-CON) 10 MEQ tablet [id number] Yes Take 1 tablet (10 mEq total) by mouth daily as needed (Take on days that you take the Lasix.). Wellington Hampshire, MD  Taking Active Self       Patient taking differently:      Discontinued 01/04/20 1204   sodium chloride flush (NS) 0.9 % injection 10 mL [id number]   Sindy Guadeloupe, MD  Active   sodium chloride flush (NS) 0.9 % injection 10 mL [id number]   Sindy Guadeloupe, MD  Active   Med List Note Sheliah Hatch, CPhT 01/08/20 1616): Lenvima filled through [place]           Patient Active Problem List   Diagnosis Date Noted  . Sepsis ([place]) 08/13/2020  . Chronic systolic CHF (congestive heart failure) ([place]) 07/20/2020  . SVT (supraventricular tachycardia) ([place]) 07/20/2020  . Cardiomyopathy ([place]) 07/20/2020  . Hypotension 07/20/2020  . NSTEMI (non-ST elevated myocardial infarction) ([place]) 07/20/2020  . Acute on chronic combined systolic and diastolic CHF (congestive heart failure) ([place]) 05/19/2020  . PE (pulmonary thromboembolism) ([place]) 05/19/2020  . GERD (gastroesophageal reflux disease) 05/19/2020  . UTI (urinary tract infection) 05/19/2020  . Hospital discharge follow-up 03/02/2020  . Hemoptysis 02/20/2020  . Acute on chronic congestive heart failure ([place])   . Palliative care encounter   . Acute pulmonary embolism ([place]) 02/14/2020  . Thrombocytopenia ([place]) 02/14/2020  . Hypothyroidism 02/14/2020  . Chest pain 12/29/2019  . Leukocytosis 12/29/2019  . HTN (hypertension) 12/29/2019  . HLD (hyperlipidemia) 12/29/2019  . Elevated troponin 12/29/2019  . Stage 3b chronic kidney disease ([place]) 12/29/2019  . Depression with anxiety 12/29/2019  . Abnormal cardiovascular stress test   . Dyspnea on exertion   . Goals of care, counseling/discussion 10/20/2019  . Peritoneal carcinomatosis ([place]) 10/20/2019  . Peritoneal metastases ([place]) 10/16/2019  . Right anterior knee pain 07/25/2019  . Educated about COVID-19 virus infection 11/15/2018  . Cramps, muscle, general 04/06/2018  . B12 deficiency anemia 07/24/2017  . Normocytic anemia 07/18/2017  . Back pain 07/18/2017  . Chemotherapy-induced  peripheral neuropathy ([place]) 12/10/2016  . Hyperlipidemia 08/13/2016  . Hx of colonic polyps 08/13/2016  . Benign neoplasm of sigmoid colon 08/13/2016  . Endometrial cancer ([place]) 08/13/2016  . 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 Lymphedema 07/20/2016  . Chronic venous insufficiency 07/20/2016  . PAD (peripheral artery disease) ([place]) 07/20/2016  .  Class 1 obesity due to excess calories without serious comorbidity with body mass index (BMI) of 34.0 to 34.9 in adult 05/27/2016  . Leg cramps 05/27/2016  . Bronchiectasis ([place]) 12/19/2015  . Pre-syncope 12/19/2015  . Prolapsed, uterovaginal, incomplete 06/24/2014  . Routine general medical examination at a health care facility 12/30/2012  . Obesity (BMI 30-39.9) 12/30/2012  . Hx of multiple pulmonary nodules 12/28/2012  . Plantar fasciitis of right foot 12/28/2012  . Hip pain, bilateral 09/12/2012  . History of Hodgkin's lymphoma 09/12/2012  . Coronary artery disease   . Chest pain on exertion 02/29/2012    Immunization History  Administered Date(s) Administered  . PFIZER(Purple Top)SARS-COV-2 Vaccination 08/20/2019, 09/12/2019  . Pneumococcal Conjugate-13 04/29/2015  . Pneumococcal Polysaccharide-23 04/02/2020  . Tdap 10/05/2011    Conditions to be addressed/monitored: CHF, CAD, HTN, HLD and endometrial carcinoma  Care Plan : Medication Management  Updates made by De Hollingshead, RPH-CPP since 09/11/2020 12:00 AM  Completed 09/11/2020  Problem: CHF, CAD/PAD, metastatic endometrial carcinoma Resolved 09/11/2020    Long-Range Goal: Disease Management Completed 09/11/2020  Start Date: 08/20/2020  This Visit's Progress: On track  Recent Progress: On track  Priority: High  Note:   Current Barriers:  . Unable to independently afford treatment regimen . Complex patient with terminal comorbidities  Pharmacist Clinical Goal(s):  Marland Kitchen Over the next 90 days, patient will verbalize ability to afford  treatment regimen through collaboration with PharmD and provider.   Interventions: . 1:1 collaboration with [name], MD regarding development and update of comprehensive plan of care as evidenced by provider attestation and co-signature . Inter-disciplinary care team collaboration (see longitudinal plan of care) . Comprehensive medication review performed; medication list updated in electronic medical record  Endometrial Carcinoma: . Progressing; Reports vaginal bleeding. Current symptomatic regimen: oxycodone 5 mg - reports she rarely uses; morphine solution - reports she has never used; lorazepam 0.5 mg PRN; prochlorperazine 10 mg PRN . Peripheral neuropathy: amitriptyline 25 mg daily;  . Recurrent UTIs: cranberry . Continue collaboration with oncology, palliative care. Consider dose increase of amitriptyline for better neuropathic pain control  CHF (last EF 20%); SVT: . Uncontrolled; current treatment: Jardiance 10 mg daily, Corlanor 5 mg BID, amiodarone 200 mg daily; has furosemide 20 mg + potassium 10 mEq daily but has not needed much recently; follows w/ Dr. [name];  . Cardiology assisted with patient assistance for Corlanor. Darlyne Russian, RN submitted application for [employer]. Patient has not heard from [facility] yet.  . Continue collaboration with cardiology along with current regimen at this time  Hyperlipidemia, secondary ASCVD prevention (PAD/CAD): . Controlled; current treatment: atorvastatin 20 mg daily  . Antiplatelet regimen: none at this time due to concurrent anticoagulant therapy . Recommend to continue current regimen at this time  Prevention of VTE: . Appropriately managed; current regimen: Eliquis 2.5 mg BID  . Discussion w/ Gyn Onc to hold anticoagulation due to vaginal bleeding, however, decision was made that patient would like to continue anticoagulation to prevent a VTE event.  Darlyne Russian, RN submitted application for Eliquis. Patient has not heard from  [facility] yet.  . Recommend to continue current regimen along with collaboration with cardiology at this time.  Hypothyroidism: . Controlled per last lab work; current treatment: levothyroxine 75 mcg daily . Recommend to continue current regimen at this time  Acid Reflux: . Managed; current treatment: pantoprazole 20 mg daily . Recommend to continue current regimen at this time  Supplements: Marland Kitchen Vitamin B12 injections every  2 weeks, daily Vitamin D3   Patient Goals/Self-Care Activities . Over the next 90 days, patient will:  - take medications as prescribed collaborate with provider on medication access solutions  Follow Up Plan: Goals of care met. Closing case.       Medication Assistance: Corlanor obtained through [place] medication assistance program.  Enrollment ends 06/14/21. Collaborating with cardiology for Eliquis and Jardiance assitsance  Patient's preferred pharmacy is:  [facility], [address] [place] [address] Phone: [phone] Fax: [phone]  [id number]: [phone]: [phone]   Follow Up:  Patient agrees to Care Plan and Follow-up.  Plan: Goals of care met. Closing pharmacy involvement in CCM case at this time  CLyon PharmD, [place]ractor[place]

## 2020-09-12 ENCOUNTER — Telehealth: Payer: Self-pay | Admitting: *Deleted

## 2020-09-12 NOTE — Telephone Encounter (Addendum)
Received response fax from [place] Patient Assistance for pt's Eliquis.  States "Documentation of 3% out-of-pocket prescription expenses, based on household adjusted gross income, not met." Case # PAT - [id number]  I called and spoke to pt, made aware of denial. Pt also verified that both pharmacies, [facility] and [facility] OOP expense reports do not reflect required amount at this time. Pt verbalized understanding. Samples left at front to assist at this time. Pt will follow up in office 5/19. Pt has no further needs at this time.  

## 2020-09-16 ENCOUNTER — Telehealth: Payer: Self-pay | Admitting: Internal Medicine

## 2020-09-16 ENCOUNTER — Ambulatory Visit (INDEPENDENT_AMBULATORY_CARE_PROVIDER_SITE_OTHER): Payer: PPO | Admitting: *Deleted

## 2020-09-16 DIAGNOSIS — I471 Supraventricular tachycardia: Secondary | ICD-10-CM

## 2020-09-16 DIAGNOSIS — C541 Malignant neoplasm of endometrium: Secondary | ICD-10-CM | POA: Diagnosis not present

## 2020-09-16 DIAGNOSIS — C786 Secondary malignant neoplasm of retroperitoneum and peritoneum: Secondary | ICD-10-CM

## 2020-09-16 DIAGNOSIS — I5022 Chronic systolic (congestive) heart failure: Secondary | ICD-10-CM | POA: Diagnosis not present

## 2020-09-16 DIAGNOSIS — I890 Lymphedema, not elsewhere classified: Secondary | ICD-10-CM

## 2020-09-16 NOTE — Chronic Care Management (AMB) (Signed)
Chronic Care Management   CCM RN Visit Note  09/16/2020 Name: Jaime Hall MRN: [id number] DOB: 13-Aug-1950  Subjective: Jaime Hall is a 70 y.o. year old female who is a primary care patient of Derrel Nip, Aris Everts, MD. The care management team was consulted for assistance with disease management and care coordination needs.    Engaged with patient by telephone for follow up visit in response to provider referral for case management and/or care coordination services.   Consent to Services:  The patient was given information about Chronic Care Management services, agreed to services, and gave verbal consent prior to initiation of services.  Please see initial visit note for detailed documentation.   Patient agreed to services and verbal consent obtained.   Assessment: Review of patient past medical history, allergies, medications, health status, including review of consultants reports, laboratory and other test data, was performed as part of comprehensive evaluation and provision of chronic care management services.   SDOH (Social Determinants of Health) assessments and interventions performed:    CCM Care Plan  Allergies  Allergen Reactions  . Metoprolol Other (See Comments)    Fatigue  . Adhesive [Tape] Other (See Comments)    Skin Irritation   . Antifungal [Miconazole Nitrate] Rash  . Sulfa Antibiotics Nausea And Vomiting and Rash  . Z-Pak [Azithromycin] Itching    Outpatient Encounter Medications as of 09/16/2020  Medication Sig  . amiodarone (PACERONE) 200 MG tablet Take 1 tablet (200 mg total) by mouth daily.  Marland Kitchen amitriptyline (ELAVIL) 25 MG tablet Take 1 tablet (25 mg total) by mouth at bedtime. May increase weekly as needed  . apixaban (ELIQUIS) 2.5 MG TABS tablet Take 1 tablet (2.5 mg total) by mouth 2 (two) times daily.  Marland Kitchen atorvastatin (LIPITOR) 10 MG tablet Take 1 tablet (10 mg total) by mouth daily.  . cholecalciferol (VITAMIN D3) 25 MCG (1000 UNIT) tablet Take  1,000 Units by mouth in the morning and at bedtime.  Marland Kitchen CRANBERRY PO Take 1 tablet by mouth daily.  . cyanocobalamin (,VITAMIN B-12,) 1000 MCG/ML injection Inject 1 mL (1,000 mcg total) into the muscle every 14 (fourteen) days.  . empagliflozin (JARDIANCE) 10 MG TABS tablet Take 1 tablet (10 mg total) by mouth daily.  . furosemide (LASIX) 20 MG tablet Take 1 tablet (20 mg total) by mouth as needed for fluid or edema.  . ivabradine (CORLANOR) 5 MG TABS tablet Take 1 tablet (5 mg total) by mouth 2 (two) times daily with a meal.  . levothyroxine (SYNTHROID) 75 MCG tablet Take 1 tablet (75 mcg total) by mouth daily before breakfast.  . LORazepam (ATIVAN) 0.5 MG tablet Take 1 tablet (0.5 mg total) by mouth every 6 (six) hours as needed for anxiety (and to help with breathing).  . Morphine Sulfate (MORPHINE CONCENTRATE) 10 mg / 0.5 ml concentrated solution Take 0.25 mLs (5 mg total) by mouth every 6 (six) hours as needed for severe pain or shortness of breath.  . oxyCODONE (OXY IR/ROXICODONE) 5 MG immediate release tablet Take 1 tablet (5 mg total) by mouth every 4 (four) hours as needed for severe pain.  . pantoprazole (PROTONIX) 20 MG tablet Take 1 tablet (20 mg total) by mouth daily.  . potassium chloride (KLOR-CON) 10 MEQ tablet Take 1 tablet (10 mEq total) by mouth daily as needed (Take on days that you take the Lasix.).  . [DISCONTINUED] prochlorperazine (COMPAZINE) 10 MG tablet Take 1 tablet (10 mg total) by mouth every 6 (six) hours  as needed (Nausea or vomiting). (Patient taking differently: Take 10 mg by mouth daily as needed for nausea or vomiting. )   Facility-Administered Encounter Medications as of 09/16/2020  Medication  . heparin lock flush 100 unit/mL  . sodium chloride flush (NS) 0.9 % injection 10 mL  . sodium chloride flush (NS) 0.9 % injection 10 mL    Patient Active Problem List   Diagnosis Date Noted  . Sepsis ([place]) 08/13/2020  . Chronic systolic CHF (congestive heart failure)  ([place]) 07/20/2020  . SVT (supraventricular tachycardia) ([place]) 07/20/2020  . Cardiomyopathy ([place]) 07/20/2020  . Hypotension 07/20/2020  . NSTEMI (non-ST elevated myocardial infarction) ([place]) 07/20/2020  . Acute on chronic combined systolic and diastolic CHF (congestive heart failure) ([place]) 05/19/2020  . PE (pulmonary thromboembolism) ([place]) 05/19/2020  . GERD (gastroesophageal reflux disease) 05/19/2020  . UTI (urinary tract infection) 05/19/2020  . Hospital discharge follow-up 03/02/2020  . Hemoptysis 02/20/2020  . Acute on chronic congestive heart failure ([place])   . Palliative care encounter   . Acute pulmonary embolism ([place]) 02/14/2020  . Thrombocytopenia ([place]) 02/14/2020  . Hypothyroidism 02/14/2020  . Chest pain 12/29/2019  . Leukocytosis 12/29/2019  . HTN (hypertension) 12/29/2019  . HLD (hyperlipidemia) 12/29/2019  . Elevated troponin 12/29/2019  . Stage 3b chronic kidney disease ([place]) 12/29/2019  . Depression with anxiety 12/29/2019  . Abnormal cardiovascular stress test   . Dyspnea on exertion   . Goals of care, counseling/discussion 10/20/2019  . Peritoneal carcinomatosis ([place]) 10/20/2019  . Peritoneal metastases ([place]) 10/16/2019  . Right anterior knee pain 07/25/2019  . Educated about COVID-19 virus infection 11/15/2018  . Cramps, muscle, general 04/06/2018  . B12 deficiency anemia 07/24/2017  . Normocytic anemia 07/18/2017  . Back pain 07/18/2017  . Chemotherapy-induced peripheral neuropathy ([place]) 12/10/2016  . Hyperlipidemia 08/13/2016  . Hx of colonic polyps 08/13/2016  . Benign neoplasm of sigmoid colon 08/13/2016  . Endometrial cancer ([place]) 08/13/2016  . 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 Lymphedema 07/20/2016  . Chronic venous insufficiency 07/20/2016  . PAD (peripheral artery disease) ([place]) 07/20/2016  . Class 1 obesity due to excess calories without serious comorbidity with body mass index (BMI) of 34.0 to 34.9 in adult  05/27/2016  . Leg cramps 05/27/2016  . Bronchiectasis ([place]) 12/19/2015  . Pre-syncope 12/19/2015  . Prolapsed, uterovaginal, incomplete 06/24/2014  . Routine general medical examination at a health care facility 12/30/2012  . Obesity (BMI 30-39.9) 12/30/2012  . Hx of multiple pulmonary nodules 12/28/2012  . Plantar fasciitis of right foot 12/28/2012  . Hip pain, bilateral 09/12/2012  . History of Hodgkin's lymphoma 09/12/2012  . Coronary artery disease   . Chest pain on exertion 02/29/2012    Conditions to be addressed/monitored:CHF, HTN and SVT and Endometrial Cancer  Care Plan : Endometrial Cancer  Updates made by Leona Singleton, RN since 09/16/2020 12:00 AM  Problem: Restablishing Palliative Care   Priority: Medium  Long-Range Goal: Palliative Care Plan Developed   Start Date: 08/05/2020  Expected End Date: 12/12/2020  This Visit's Progress: On track  Recent Progress: On track  Priority: Medium  Current Barriers:   Ineffective Self Health Maintenance due to weakness related to decrease in cardiac output and periods of SVT related to cancer treatments.  Patient is reporting she is doing well.  Continues to have nausea and abdominal pain, but states they are both manageable at this time.  Continues to be in good spirits and is looking forward to her  son's 44 th birthday in May.  States she feels well supported and is followed by Palliative Care at Oncology office, [facility], [facility], as well and CCM with primary care.  Currently UNABLE TO independently self manage needs related to chronic health conditions due to weak heart and getting short of breath with minimal exertion  Knowledge Deficits related to short term plan for care coordination needs and long term plans for chronic disease management needs Clinical Goal(s):  Marland Kitchen Collaboration with [name], MD regarding development and update of comprehensive plan of care as evidenced by provider attestation and  co-signature . Inter-disciplinary care team collaboration (see longitudinal plan of care)  Over the next 90 days, patient will work with care management team to address care coordination and chronic disease management needs related to Disease Management and Care Coordination   Interventions:   Evaluation of current treatment plan related to Cancer and Nutrition self-management and patient's adherence to plan as established by provider.  Collaboration with [name], MD regarding development and update of comprehensive plan of care as evidenced by provider attestation       and co-signature  Inter-disciplinary care team collaboration (see longitudinal plan of care)  Discussed plans with patient for ongoing care management follow up and provided patient with direct contact information for care management team  Reviewed available resources patient has and encouraged her to reach out to anyone for any questions or concerns (Palliative Care at Oncology office, [facility], [facility], as well and CCM with primary care)  Discussed nutrition and diet and encouraged patient to try and maintain appetite, eating 3-4 small meals daily and treating abdominal pain and nausea as indicated  Acknowledged patient continues to be in good spirits and encouraged her to continue using alternative measures to lift spirits (prayer, meditation, family support)  Provided opportunity for patient to share what is important to her now  Provided emotional support and empathy for patient  Reviewed medications and importance of medication compliance  Encouraged patient to celebrate her birthday coming up and to enjoy times with her family as much as possibile Patient Goals/Self Care Activities:  Over the next 60 days, patient will:  . Avoid foods that might trigger nausea . Avoid strong odors that might trigger nausea . Change position slowly . Keep track of which medicine helps and which does  not  . Continue to eat small meals to help with prevent abdominal pain . Develop a personal pain management plan . Learn relaxation techniques . Monitor nausea and eat small meals to prevent abdominal bloating/pain . Do not let pain get too severe before treating Follow Up Plan: The care management team will reach out to the patient again over the next 45 business days.    Care Plan : Heart Failure/HTN/SVT  Updates made by Leona Singleton, RN since 09/16/2020 12:00 AM  Problem: Disease Progression (Heart Failure)   Priority: High  Long-Range Goal: Comorbidities Identified and Managed related to heart failure, SVT, and hypertension   Start Date: 08/05/2020  Expected End Date: 12/12/2020  This Visit's Progress: On track  Recent Progress: On track  Priority: High  Current Barriers:  Marland Kitchen Knowledge deficit related to basic heart failure pathophysiology and self care management as evidenced by patient previously not weighing herself daily.  Patient reports weighing herself every other day.  Weight this morning was 137 pounds (ranging 137-144).  Does report lower extremity edema and states she takes her fluid medication about every other  day and it helps with lower extremity edema.  Denies any episodes of feeling heart racing. Case Manager Clinical Goal(s):  . patient will weigh self daily and record . patient will verbalize understanding of Heart Failure Action Plan and when to call doctor . patient will take all Heart Failure mediations as prescribed . patient will weigh daily and record (notifying MD of 3 lb weight gain over night or 5 lb in a week) Interventions:  . Collaboration with [name], MD regarding development and update of comprehensive plan of care as evidenced by provider attestation and co-signature . Inter-disciplinary care team collaboration (see longitudinal plan of care) . Basic overview and discussion of pathophysiology of Heart Failure reviewed  . Discussed importance  of daily weight monitoring, reviewed when to call provider based on weights in hopes to prevent hospitalization . Advised patient to weigh each morning after emptying bladder . Discussed recent urinary tract infection and patient endorses no signs and symptoms . Reviewed role of diuretics in prevention of fluid overload and management of heart failure, reviewed medications and encouraged medication compliance . Discussed importance of blood pressure monitoring and encouraged patient to check blood pressures at least 3 times a week and to monitor for hypotension as patient sometimes runs with low blood pressures . Encouraged patient to monitor for elevated heart rates and to apply oxygen if heart rates increase while waiting for medical assistance . Confirmed with patient she is still working with Cardiology for patient assistance and medication samples Patient Goals/Self-Care Activities . Call office if I gain more than 2 pounds in one day or 5 pounds in one week . Watch for swelling in feet, ankles and legs every day . Weigh myself daily and keep track in log  . Take fluid medication for increase in weight or fluid in extremities . Check blood pressures at least 3 times a week and monitor for low blood pressures . Continue to monitor your heart rate for elevations Follow Up Plan: The care management team will reach out to the patient again over the next 60 business days.      Plan:The care management team will reach out to the patient again over the next 60 business days.  Hubert Azure RN, MSN RN Care Management Coordinator [place] [phone] [email or web address]

## 2020-09-16 NOTE — Patient Instructions (Signed)
Visit Information  PATIENT GOALS: Goals Addressed            This Visit's Progress   . (RNCM) Keep Nausea Under Control and Maintain Appetite   On track    Timeframe:  Long-Range Goal Priority:  Medium Start Date:   08/05/20                          Expected End Date:   12/12/20                    Follow Up Date 10/30/20    . Avoid foods that might trigger nausea . Avoid strong odors that might trigger nausea . Change position slowly . Keep track of which medicine helps and which does not  . Continue to eat small meals to help with prevent abdominal pain   Why is this important?    During cancer treatment, it may be hard to keep your weight and strength up.   Feeling sick a lot can make life tough.   There are some simple things to try that will help you have fewer bad days.   Eating a variety of healthy foods that are high in calories and protein will help.   Keeping away from foods and smells that might make you feel worse can make a big difference.     Notes:     Marland Kitchen (RNCM) Keep Pain Under Control   On track    Timeframe:  Long-Range Goal Priority:  Medium Start Date:  08/05/20                           Expected End Date:  12/12/20                     Follow Up Date 10/30/20   . Develop a personal pain management plan . Learn relaxation techniques . Monitor nausea and eat small meals to prevent abdominal bloating/pain . Do not let pain get too severe before treating   Why is this important?    Day-to-day life can be hard when you have pain.   Even a small change in emotion or a physical problem can make pain better or worse.   Coping with pain depends on how the mind and body reacts to pain.   Pain medicine is just one piece of the treatment puzzle.   There are many tools to help manage pain. A combination of them can be used to best meet your needs.     Notes:     Marland Kitchen (RNCM) Track and Manage Swelling, Heart Rate and Blood Pressure   On track    Timeframe:   Long-Range Goal Priority:  High Start Date:  08/05/20                           Expected End Date:   12/12/20                    Follow Up Date 10/30/2020    . Call office if I gain more than 2 pounds in one day or 5 pounds in one week . Watch for swelling in feet, ankles and legs every day . Weigh myself daily and keep track in log  . Take fluid medication for increase in weight or fluid in extremities . Check blood pressures at least 3  times a week and monitor for low blood pressures . Continue to monitor your heart rate for elevations   Why is this important?    It is important to check your weight daily and watch how much salt and liquids you have.   It will help you to manage your heart failure.    Notes:        Patient verbalizes understanding of instructions provided today and agrees to view in [place].   The care management team will reach out to the patient again over the next 60 business days.   Hubert Azure RN, MSN RN Care Management Coordinator [place] [phone] [email or web address]

## 2020-09-16 NOTE — Telephone Encounter (Signed)
Recv'd records from [place] of [facility] forwarded 5 pages to Dr. Deborra Medina 4/4/22fbg

## 2020-09-17 ENCOUNTER — Telehealth: Payer: PPO

## 2020-09-18 ENCOUNTER — Telehealth: Payer: Self-pay | Admitting: Adult Health Nurse Practitioner

## 2020-09-18 NOTE — Telephone Encounter (Signed)
Called patient to remind of appt tomorrow.  Left VM with reason for call and call back info Senon Nixon K. Olena Heckle NP

## 2020-09-19 ENCOUNTER — Other Ambulatory Visit: Payer: PPO | Admitting: Adult Health Nurse Practitioner

## 2020-09-19 ENCOUNTER — Other Ambulatory Visit: Payer: Self-pay

## 2020-09-19 DIAGNOSIS — Z515 Encounter for palliative care: Secondary | ICD-10-CM

## 2020-09-19 DIAGNOSIS — C786 Secondary malignant neoplasm of retroperitoneum and peritoneum: Secondary | ICD-10-CM

## 2020-09-19 NOTE — Progress Notes (Signed)
[facility] Palliative Care Consult Note Telephone: [phone]  Fax: [phone]  PATIENT NAME: Jaime Hall DOB: 1951/03/03 MRN: [id number]  PRIMARY CARE PROVIDER:   [name], MD  REFERRING PROVIDER:  Billey Chang NP  RESPONSIBLE PARTY:   Self [phone] Jaime Hall, daughter in [place], [phone]  Chief complaint: follow up palliative visit for medical decision making   RECOMMENDATIONS and PLAN:  1.  Advanced care planning.  Patient is DNR.   2.  Recurrent endometrial cancer.  Due to intolerance patient is not undergoing treatment.  She has been given prognosis of less than 6 months.  She is having daily vaginal bleeding.  Continues to have regular labs with oncology and wishes to have blood transfusion if needed due to blood loss.  Continue follow up and recommendations by oncology.  Palliative will continue to follow for symptom management/decline and make recommendations as needed.  Follow up in 4 weeks.  Encouraged to call with any questions or concerns.  I spent 40 minutes providing this consultation, including time with patient/family, provider coordination, chart review, documentation. More than 50% of the time in this consultation was spent coordinating communication.   HISTORY OF PRESENT ILLNESS:  Jaime Hall is a 70 y.o. year old female with multiple medical problems including recurrent endometrial cancer, h/o Hodgkin's lymphoma, HTN, HLD, GERD, depression, CAD, PVD, CKD stage III, CHF with EF 30-35%, bronchiectasis. Palliative Care was asked to help address goals of care. Patient has no new concerns today.  Does state having continued vaginal bleeding which is worse some days than other. Denies increased SOB or weakness.  States at times having lower abdominal discomfort/pain related to the endometrial cancer.  She is able to independently perform ADLs and IADLs but has to take several rest breaks. Her appetite has  been stable with no reported weight loss.  Rest of 10 point ROS asked and negative unless stated in HPI.    CODE STATUS: DNR  PPS: 70% HOSPICE ELIGIBILITY/DIAGNOSIS: TBD  PHYSICAL EXAM:  BP 118/52 HR 78 O2 97% on RA General: NAD, frail appearing, thin Eyes: sclera anicteric and noninjected with no discharge noted ENMT:  Moist mucous membranes Cardiovascular: regular rate and rhythm Pulmonary: lung sounds clear; normal respiratory effort Abdomen: soft, + bowel sounds Extremities: right leg nonpitting edema (this is baseline), no joint deformities Skin: no rashes on exposed skin Neurological: Weakness but otherwise nonfocal  PAST MEDICAL HISTORY:  Past Medical History:  Diagnosis Date  . Anxiety   . Cervicalgia   . Chronic kidney disease   . Coronary artery disease    a. 02/2012 Stress echo: severe anterior wall ischemia;  b. 02/2012 Cath/PCI: LAD 95p (3.0 x 15 Xience EX DES), D1 90ost (PTCA - bifurcational dzs), EF 45% with anterior HK;  c. 02/2013 Ex MV: fixed anterior defect w/ minor reversibility, nl EF-->Med Rx; d. 11/2019 MV: EF 41%, ant/antlat ischemia-->high risk scan; e. 11/2019 Cath: LM nl, LAD 40ost/p, patent LAD stent, LCX nl, RCA nl, RPDA/RPAV nl, EF 30  . Endometrial cancer ([place])    a. 07/2016 s/p robotic hysterectomy, BSO w/ washings, sentinel node inj, mapping, bx, adhesiolysis.  . Essential hypertension, benign   . Fibrocystic breast disease   . GERD (gastroesophageal reflux disease)   . Gestational hypertension   . Heart murmur   . HFrEF (heart failure with reduced ejection fraction) ([place])    a. 05/2020 Echo: EF <20%, glob HK.  Marland Kitchen History of anemia   .  History of blood transfusion   . Hodgkin's lymphoma ([place]) 2011   a. s/p radiation and chemo therapy  . Mixed Ischemic and Nonischemic Cardiomyopathy    a. 05/2020 Echo: EF <20%, glob HK, Nl RV size/fxn, mildly dil LA. Mild MR. Mild to mod Ao sclerosis.  . Osteoarthritis   . Polycystic ovarian disease     SOCIAL HX:   Social History   Tobacco Use  . Smoking status: Former Smoker    Packs/day: 1.00    Years: 30.00    Pack years: 30.00    Types: Cigarettes    Quit date: 07/24/2002    Years since quitting: 18.1  . Smokeless tobacco: Never Used  . Tobacco comment: quit smoking in 2000  Substance Use Topics  . Alcohol use: Not Currently    ALLERGIES:  Allergies  Allergen Reactions  . Metoprolol Other (See Comments)    Fatigue  . Adhesive [Tape] Other (See Comments)    Skin Irritation   . Antifungal [Miconazole Nitrate] Rash  . Sulfa Antibiotics Nausea And Vomiting and Rash  . Z-Pak [Azithromycin] Itching     PERTINENT MEDICATIONS:  Outpatient Encounter Medications as of 09/19/2020  Medication Sig  . amiodarone (PACERONE) 200 MG tablet Take 1 tablet (200 mg total) by mouth daily.  Marland Kitchen amitriptyline (ELAVIL) 25 MG tablet Take 1 tablet (25 mg total) by mouth at bedtime. May increase weekly as needed  . apixaban (ELIQUIS) 2.5 MG TABS tablet Take 1 tablet (2.5 mg total) by mouth 2 (two) times daily.  Marland Kitchen atorvastatin (LIPITOR) 10 MG tablet Take 1 tablet (10 mg total) by mouth daily.  . cholecalciferol (VITAMIN D3) 25 MCG (1000 UNIT) tablet Take 1,000 Units by mouth in the morning and at bedtime.  Marland Kitchen CRANBERRY PO Take 1 tablet by mouth daily.  . cyanocobalamin (,VITAMIN B-12,) 1000 MCG/ML injection Inject 1 mL (1,000 mcg total) into the muscle every 14 (fourteen) days.  . empagliflozin (JARDIANCE) 10 MG TABS tablet Take 1 tablet (10 mg total) by mouth daily.  . furosemide (LASIX) 20 MG tablet Take 1 tablet (20 mg total) by mouth as needed for fluid or edema.  . ivabradine (CORLANOR) 5 MG TABS tablet Take 1 tablet (5 mg total) by mouth 2 (two) times daily with a meal.  . levothyroxine (SYNTHROID) 75 MCG tablet Take 1 tablet (75 mcg total) by mouth daily before breakfast.  . LORazepam (ATIVAN) 0.5 MG tablet Take 1 tablet (0.5 mg total) by mouth every 6 (six) hours as needed for anxiety (and to help with  breathing).  . Morphine Sulfate (MORPHINE CONCENTRATE) 10 mg / 0.5 ml concentrated solution Take 0.25 mLs (5 mg total) by mouth every 6 (six) hours as needed for severe pain or shortness of breath.  . oxyCODONE (OXY IR/ROXICODONE) 5 MG immediate release tablet Take 1 tablet (5 mg total) by mouth every 4 (four) hours as needed for severe pain.  . pantoprazole (PROTONIX) 20 MG tablet Take 1 tablet (20 mg total) by mouth daily.  . potassium chloride (KLOR-CON) 10 MEQ tablet Take 1 tablet (10 mEq total) by mouth daily as needed (Take on days that you take the Lasix.).  . [DISCONTINUED] prochlorperazine (COMPAZINE) 10 MG tablet Take 1 tablet (10 mg total) by mouth every 6 (six) hours as needed (Nausea or vomiting). (Patient taking differently: Take 10 mg by mouth daily as needed for nausea or vomiting. )   Facility-Administered Encounter Medications as of 09/19/2020  Medication  . heparin lock flush 100 unit/mL  .  sodium chloride flush (NS) 0.9 % injection 10 mL  . sodium chloride flush (NS) 0.9 % injection 10 mL    Trayden Brandy Jenetta [place]er, NP

## 2020-09-21 DIAGNOSIS — I509 Heart failure, unspecified: Secondary | ICD-10-CM | POA: Diagnosis not present

## 2020-09-21 DIAGNOSIS — J9601 Acute respiratory failure with hypoxia: Secondary | ICD-10-CM | POA: Diagnosis not present

## 2020-09-23 ENCOUNTER — Other Ambulatory Visit: Payer: Self-pay

## 2020-09-23 ENCOUNTER — Telehealth: Payer: Self-pay | Admitting: *Deleted

## 2020-09-23 ENCOUNTER — Inpatient Hospital Stay (HOSPITAL_BASED_OUTPATIENT_CLINIC_OR_DEPARTMENT_OTHER): Payer: PPO | Admitting: Oncology

## 2020-09-23 ENCOUNTER — Inpatient Hospital Stay: Payer: PPO

## 2020-09-23 ENCOUNTER — Inpatient Hospital Stay: Payer: PPO | Attending: Oncology

## 2020-09-23 VITALS — BP 99/54 | HR 76 | Temp 97.3°F | Resp 16 | Wt 132.6 lb

## 2020-09-23 DIAGNOSIS — N1832 Chronic kidney disease, stage 3b: Secondary | ICD-10-CM | POA: Diagnosis not present

## 2020-09-23 DIAGNOSIS — R110 Nausea: Secondary | ICD-10-CM | POA: Insufficient documentation

## 2020-09-23 DIAGNOSIS — R1031 Right lower quadrant pain: Secondary | ICD-10-CM | POA: Insufficient documentation

## 2020-09-23 DIAGNOSIS — I959 Hypotension, unspecified: Secondary | ICD-10-CM

## 2020-09-23 DIAGNOSIS — Z8571 Personal history of Hodgkin lymphoma: Secondary | ICD-10-CM | POA: Insufficient documentation

## 2020-09-23 DIAGNOSIS — R54 Age-related physical debility: Secondary | ICD-10-CM | POA: Diagnosis not present

## 2020-09-23 DIAGNOSIS — R634 Abnormal weight loss: Secondary | ICD-10-CM | POA: Insufficient documentation

## 2020-09-23 DIAGNOSIS — Z7901 Long term (current) use of anticoagulants: Secondary | ICD-10-CM | POA: Diagnosis not present

## 2020-09-23 DIAGNOSIS — D649 Anemia, unspecified: Secondary | ICD-10-CM | POA: Insufficient documentation

## 2020-09-23 DIAGNOSIS — Z881 Allergy status to other antibiotic agents status: Secondary | ICD-10-CM | POA: Diagnosis not present

## 2020-09-23 DIAGNOSIS — Z923 Personal history of irradiation: Secondary | ICD-10-CM | POA: Insufficient documentation

## 2020-09-23 DIAGNOSIS — I130 Hypertensive heart and chronic kidney disease with heart failure and stage 1 through stage 4 chronic kidney disease, or unspecified chronic kidney disease: Secondary | ICD-10-CM | POA: Insufficient documentation

## 2020-09-23 DIAGNOSIS — G620 Drug-induced polyneuropathy: Secondary | ICD-10-CM | POA: Insufficient documentation

## 2020-09-23 DIAGNOSIS — Z170 Estrogen receptor positive status [ER+]: Secondary | ICD-10-CM | POA: Insufficient documentation

## 2020-09-23 DIAGNOSIS — N179 Acute kidney failure, unspecified: Secondary | ICD-10-CM

## 2020-09-23 DIAGNOSIS — Z87891 Personal history of nicotine dependence: Secondary | ICD-10-CM | POA: Insufficient documentation

## 2020-09-23 DIAGNOSIS — Z79899 Other long term (current) drug therapy: Secondary | ICD-10-CM | POA: Diagnosis not present

## 2020-09-23 DIAGNOSIS — C786 Secondary malignant neoplasm of retroperitoneum and peritoneum: Secondary | ICD-10-CM

## 2020-09-23 DIAGNOSIS — R112 Nausea with vomiting, unspecified: Secondary | ICD-10-CM | POA: Diagnosis not present

## 2020-09-23 DIAGNOSIS — R42 Dizziness and giddiness: Secondary | ICD-10-CM | POA: Insufficient documentation

## 2020-09-23 DIAGNOSIS — Z882 Allergy status to sulfonamides status: Secondary | ICD-10-CM | POA: Diagnosis not present

## 2020-09-23 DIAGNOSIS — Z823 Family history of stroke: Secondary | ICD-10-CM | POA: Insufficient documentation

## 2020-09-23 DIAGNOSIS — Z8042 Family history of malignant neoplasm of prostate: Secondary | ICD-10-CM | POA: Insufficient documentation

## 2020-09-23 DIAGNOSIS — Z818 Family history of other mental and behavioral disorders: Secondary | ICD-10-CM | POA: Insufficient documentation

## 2020-09-23 DIAGNOSIS — C541 Malignant neoplasm of endometrium: Secondary | ICD-10-CM

## 2020-09-23 DIAGNOSIS — Z888 Allergy status to other drugs, medicaments and biological substances status: Secondary | ICD-10-CM | POA: Insufficient documentation

## 2020-09-23 DIAGNOSIS — D72829 Elevated white blood cell count, unspecified: Secondary | ICD-10-CM | POA: Insufficient documentation

## 2020-09-23 DIAGNOSIS — Z800 Family history of malignant neoplasm of digestive organs: Secondary | ICD-10-CM | POA: Insufficient documentation

## 2020-09-23 DIAGNOSIS — Z9049 Acquired absence of other specified parts of digestive tract: Secondary | ICD-10-CM | POA: Insufficient documentation

## 2020-09-23 DIAGNOSIS — T451X5A Adverse effect of antineoplastic and immunosuppressive drugs, initial encounter: Secondary | ICD-10-CM | POA: Insufficient documentation

## 2020-09-23 DIAGNOSIS — Z820 Family history of epilepsy and other diseases of the nervous system: Secondary | ICD-10-CM | POA: Insufficient documentation

## 2020-09-23 DIAGNOSIS — Z90710 Acquired absence of both cervix and uterus: Secondary | ICD-10-CM | POA: Insufficient documentation

## 2020-09-23 DIAGNOSIS — Z90722 Acquired absence of ovaries, bilateral: Secondary | ICD-10-CM | POA: Insufficient documentation

## 2020-09-23 DIAGNOSIS — Z833 Family history of diabetes mellitus: Secondary | ICD-10-CM | POA: Insufficient documentation

## 2020-09-23 DIAGNOSIS — I255 Ischemic cardiomyopathy: Secondary | ICD-10-CM | POA: Diagnosis not present

## 2020-09-23 DIAGNOSIS — K219 Gastro-esophageal reflux disease without esophagitis: Secondary | ICD-10-CM | POA: Diagnosis not present

## 2020-09-23 DIAGNOSIS — Z803 Family history of malignant neoplasm of breast: Secondary | ICD-10-CM | POA: Insufficient documentation

## 2020-09-23 LAB — CBC WITH DIFFERENTIAL/PLATELET
Abs Immature Granulocytes: 0.29 10*3/uL — ABNORMAL HIGH (ref 0.00–0.07)
Basophils Absolute: 0.1 10*3/uL (ref 0.0–0.1)
Basophils Relative: 1 %
Eosinophils Absolute: 0.3 10*3/uL (ref 0.0–0.5)
Eosinophils Relative: 1 %
HCT: 35.3 % — ABNORMAL LOW (ref 36.0–46.0)
Hemoglobin: 11 g/dL — ABNORMAL LOW (ref 12.0–15.0)
Immature Granulocytes: 1 %
Lymphocytes Relative: 6 %
Lymphs Abs: 1.6 10*3/uL (ref 0.7–4.0)
MCH: 28.3 pg (ref 26.0–34.0)
MCHC: 31.2 g/dL (ref 30.0–36.0)
MCV: 90.7 fL (ref 80.0–100.0)
Monocytes Absolute: 0.9 10*3/uL (ref 0.1–1.0)
Monocytes Relative: 3 %
Neutro Abs: 23.1 10*3/uL — ABNORMAL HIGH (ref 1.7–7.7)
Neutrophils Relative %: 88 %
Platelets: 239 10*3/uL (ref 150–400)
RBC: 3.89 MIL/uL (ref 3.87–5.11)
RDW: 20.2 % — ABNORMAL HIGH (ref 11.5–15.5)
WBC: 26.3 10*3/uL — ABNORMAL HIGH (ref 4.0–10.5)
nRBC: 0 % (ref 0.0–0.2)

## 2020-09-23 LAB — COMPREHENSIVE METABOLIC PANEL
ALT: 18 U/L (ref 0–44)
AST: 47 U/L — ABNORMAL HIGH (ref 15–41)
Albumin: 3.5 g/dL (ref 3.5–5.0)
Alkaline Phosphatase: 282 U/L — ABNORMAL HIGH (ref 38–126)
Anion gap: 11 (ref 5–15)
BUN: 32 mg/dL — ABNORMAL HIGH (ref 8–23)
CO2: 23 mmol/L (ref 22–32)
Calcium: 9.4 mg/dL (ref 8.9–10.3)
Chloride: 101 mmol/L (ref 98–111)
Creatinine, Ser: 2.31 mg/dL — ABNORMAL HIGH (ref 0.44–1.00)
GFR, Estimated: 22 mL/min — ABNORMAL LOW (ref >60)
Glucose, Bld: 131 mg/dL — ABNORMAL HIGH (ref 70–99)
Potassium: 5 mmol/L (ref 3.5–5.1)
Sodium: 135 mmol/L (ref 135–145)
Total Bilirubin: 0.9 mg/dL (ref 0.3–1.2)
Total Protein: 7.2 g/dL (ref 6.5–8.1)

## 2020-09-23 MED ORDER — SODIUM CHLORIDE 0.9 % IV SOLN
Freq: Once | INTRAVENOUS | Status: AC
  Filled 2020-09-23: qty 250

## 2020-09-23 MED ORDER — HEPARIN SOD (PORK) LOCK FLUSH 100 UNIT/ML IV SOLN
500.0000 [IU] | Freq: Once | INTRAVENOUS | Status: AC
  Administered 2020-09-23: 500 [IU] via INTRAVENOUS
  Filled 2020-09-23: qty 5

## 2020-09-23 MED ORDER — SODIUM CHLORIDE 0.9% FLUSH
10.0000 mL | INTRAVENOUS | Status: DC | PRN
  Administered 2020-09-23: 10 mL via INTRAVENOUS
  Filled 2020-09-23: qty 10

## 2020-09-23 NOTE — Progress Notes (Signed)
Got 500 ml of NS over 1 hour and after that b/p sitting 102/46 and then standing 94/72/ pt felt better. Dr Janese Banks wants me to send message to cardiac about her low b/p

## 2020-09-23 NOTE — Progress Notes (Signed)
Hematology/Oncology Consult note [facility]  [phone] [phone]  Patient Care Team: [name], MD as PCP - General (Internal Medicine) Wellington Hampshire, MD as PCP - Cardiology (Cardiology) [name], MD as Consulting Physician (General Surgery) Mellody Drown, MD as Referring Physician (Obstetrics and Gynecology) [name], MD as Referring Physician (Obstetrics and Gynecology) Hollice Espy, MD as Consulting Physician (Urology) [name], RN as Registered Nurse Sindy Guadeloupe, MD as Consulting Physician (Oncology) Leona Singleton, RN as Case Manager   Name of the patient: Jaime Hall  [id number]  [birth year]   Date of visit: 09/23/20  Diagnosis- 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 She underwent robotic hysterectomy with bilateral salpingo-oophorectomy with washings and sentinel lymph node injection mapping and biopsy in February 2018. She had multiple postoperative complications and completed 3 cycles of carbotaxol chemotherapy in May 2018. Treatment was complicated by chemo-induced peripheral neuropathy for which Taxol dose was reduced by 25%. She completed 5 weeks of external beam whole pelvic radiation on August 2018.Her original tumor in 2018 was ER +1%. PR negative. MSI stable and HER-2 negative  She was under clinical surveillance and recently presented to [name] with symptoms of right lower quadrant abdominal pain which led to aCT abdomen which showed new peritoneal metastatic disease throughout the right abdomen and pelvis with index masses measuring 5.7 x 4.7 cm. Head  CT scan also showed evidence of hypermetabolic abdominal pelvic peritoneal metastases but no evidence of other sites of metastases.  Repeat omental biopsy confirms high grade serous carcinoma.Foundation 1 testing showed following alteration/biomarkers. Microsatellite status stable. EPHB4amplification, ESR 1 amplification, HNF1 A G 292 FS 25, pik3r1 loss PARP1 amplification3 ER 1 loss, PPP 2R1 AAS 256F, PTEN loss, T p53. However none of these mutations have actionable targets.  Repeat echocardiogram showed EF of 30 to 35%. Cardiac cath unremarkable consistent with nonischemic cardiomyopathy.Therefore Doxil not chosen and she would be getting carboplatin and gemcitabine. Chemotherapy complicated by significant anemia causing supply demand cardiac ischemia and hospitalization.Patient switched to [facility] and lenvatinib starting 01/11/2020  Patient received two cycles of Keytruda but treatment washeldsince August 2021 due to repeated hospitalizations for possible heart failure followed by diagnosis of PE.   Interval history-she feels more fatigued today.On an average she gets nausea and vomiting once a week.  She is living at her house with her sister-in-law and remains independent of her ADLs but notices that she is declining slowly.  She has lost 6 pounds in the last 1 month.  Denies any fever or exertional shortness of breath.  Denies any burning micturition.  Reports ongoing vaginal bleeding which is more frequent now.   ECOG PS- 2 Pain scale- 0 Opioid associated constipation- no  Review of systems- Review of Systems  Constitutional: Positive for malaise/fatigue and weight loss. Negative for chills and fever.  HENT: Negative for congestion, ear discharge and nosebleeds.   Eyes: Negative for blurred vision.  Respiratory: Negative for cough, hemoptysis, sputum production, shortness of breath and wheezing.   Cardiovascular: Negative for chest pain, palpitations, orthopnea and  claudication.  Gastrointestinal: Positive for nausea. Negative for abdominal pain, blood in stool, constipation, diarrhea, heartburn, melena and vomiting.  Genitourinary: Negative  for dysuria, flank pain, frequency, hematuria and urgency.  Musculoskeletal: Negative for back pain, joint pain and myalgias.  Skin: Negative for rash.  Neurological: Negative for dizziness, tingling, focal weakness, seizures, weakness and headaches.  Endo/Heme/Allergies: Does not bruise/bleed easily.  Psychiatric/Behavioral: Negative for depression and suicidal ideas. The patient does not have insomnia.       Allergies  Allergen Reactions  . Metoprolol Other (See Comments)    Fatigue  . Adhesive [Tape] Other (See Comments)    Skin Irritation   . Antifungal [Miconazole Nitrate] Rash  . Sulfa Antibiotics Nausea And Vomiting and Rash  . Z-Pak [Azithromycin] Itching     Past Medical History:  Diagnosis Date  . Anxiety   . Cervicalgia   . Chronic kidney disease   . Coronary artery disease    a. 02/2012 Stress echo: severe anterior wall ischemia;  b. 02/2012 Cath/PCI: LAD 95p (3.0 x 15 Xience EX DES), D1 90ost (PTCA - bifurcational dzs), EF 45% with anterior HK;  c. 02/2013 Ex MV: fixed anterior defect w/ minor reversibility, nl EF-->Med Rx; d. 11/2019 MV: EF 41%, ant/antlat ischemia-->high risk scan; e. 11/2019 Cath: LM nl, LAD 40ost/p, patent LAD stent, LCX nl, RCA nl, RPDA/RPAV nl, EF 30  . Endometrial cancer ([place])    a. 07/2016 s/p robotic hysterectomy, BSO w/ washings, sentinel node inj, mapping, bx, adhesiolysis.  . Essential hypertension, benign   . Fibrocystic breast disease   . GERD (gastroesophageal reflux disease)   . Gestational hypertension   . Heart murmur   . HFrEF (heart failure with reduced ejection fraction) ([place])    a. 05/2020 Echo: EF <20%, glob HK.  Marland Kitchen History of anemia   . History of blood transfusion   . Hodgkin's lymphoma ([place]) 2011   a. s/p radiation and chemo therapy  . Mixed  Ischemic and Nonischemic Cardiomyopathy    a. 05/2020 Echo: EF <20%, glob HK, Nl RV size/fxn, mildly dil LA. Mild MR. Mild to mod Ao sclerosis.  . Osteoarthritis   . Polycystic ovarian disease      Past Surgical History:  Procedure Laterality Date  . ABDOMINAL HYSTERECTOMY    . bladder sling    . CARDIAC CATHETERIZATION  02/2012   [facility] 1 stent place  . CERVICAL POLYPECTOMY    . CHOLECYSTECTOMY  1982  . COLONOSCOPY WITH PROPOFOL N/A 02/05/2015   Procedure: COLONOSCOPY WITH PROPOFOL;  Surgeon: Lucilla Lame, MD;  Location: [facility] ENDOSCOPY;  Service: Endoscopy;  Laterality: N/A;  . CORONARY ANGIOPLASTY  02/2012   left/right s/p balloon  . CYSTOGRAM N/A 08/17/2016   Procedure: CYSTOGRAM;  Surgeon: Hollice Espy, MD;  Location: [facility] ORS;  Service: Urology;  Laterality: N/A;  . CYSTOSCOPY N/A 08/17/2016   Procedure: CYSTOSCOPY EXAM UNDER ANESTHESIA;  Surgeon: Hollice Espy, MD;  Location: [facility] ORS;  Service: Urology;  Laterality: N/A;  . CYSTOSCOPY W/ RETROGRADES Bilateral 08/17/2016   Procedure: CYSTOSCOPY WITH RETROGRADE PYELOGRAM;  Surgeon: Hollice Espy, MD;  Location: [facility] ORS;  Service: Urology;  Laterality: Bilateral;  . CYSTOSCOPY WITH STENT PLACEMENT Right 08/17/2016   Procedure: CYSTOSCOPY WITH STENT PLACEMENT;  Surgeon: Hollice Espy, MD;  Location: [facility] ORS;  Service: Urology;  Laterality: Right;  . heart stent'  2013  . kidney stent Right 2018  . LYMPH NODE BIOPSY  2011   diagnosis of hodgkins lymphoma  . PELVIC LYMPH NODE DISSECTION N/A 07/29/2016   Procedure: PELVIC/AORTIC LYMPH NODE SAMPLING;  Surgeon: [name], MD;  Location: [facility] ORS;  Service: Gynecology;  Laterality: N/A;  . PORTA CATH INSERTION N/A 09/22/2016   Procedure: Glori Luis Cath Insertion;  Surgeon: [name], MD;  Location: [place] [facility];  Service: Cardiovascular;  Laterality: N/A;  . PORTA CATH INSERTION N/A 10/27/2019   Procedure: PORTA CATH INSERTION;  Surgeon: [name], MD;   Location: [place] [facility];  Service: Cardiovascular;  Laterality: N/A;  . PORTA CATH REMOVAL N/A 11/17/2016   Procedure: Glori Luis Cath Removal;  Surgeon: [name], MD;  Location: [place] [facility];  Service: Cardiovascular;  Laterality: N/A;  . RIGHT/LEFT HEART CATH AND CORONARY ANGIOGRAPHY N/A 12/04/2019   Procedure: RIGHT/LEFT HEART CATH AND CORONARY ANGIOGRAPHY;  Surgeon: Wellington Hampshire, MD;  Location: [place] [facility];  Service: Cardiovascular;  Laterality: N/A;  . ROBOTIC ASSISTED TOTAL HYSTERECTOMY WITH BILATERAL SALPINGO OOPHERECTOMY N/A 07/29/2016   Procedure: ROBOTIC ASSISTED TOTAL HYSTERECTOMY WITH BILATERAL SALPINGO OOPHORECTOMY;  Surgeon: [name], MD;  Location: [facility] ORS;  Service: Gynecology;  Laterality: N/A;  . SENTINEL NODE BIOPSY N/A 07/29/2016   Procedure: SENTINEL NODE BIOPSY;  Surgeon: [name], MD;  Location: [facility] ORS;  Service: Gynecology;  Laterality: N/A;  . transobturator sling N/A 2009   [place]    Social History   Socioeconomic History  . Marital status: Widowed    Spouse name: Not on file  . Number of children: Not on file  . Years of education: Not on file  . Highest education level: Not on file  Occupational History  . Not on file  Tobacco Use  . Smoking status: Former Smoker    Packs/day: 1.00    Years: 30.00    Pack years: 30.00    Types: Cigarettes    Quit date: 07/24/2002    Years since quitting: 18.1  . Smokeless tobacco: Never Used  . Tobacco comment: quit smoking in 2000  Vaping Use  . Vaping Use: Never used  Substance and Sexual Activity  . Alcohol use: Not Currently  . Drug use: No  . Sexual activity: Never  Other Topics Concern  . 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    Social Determinants of Health   Financial Resource Strain: Medium Risk  . Difficulty of Paying Living Expenses: Somewhat hard   Food Insecurity: No Food Insecurity  . Worried About Charity fundraiser in the Last Year: Never true  . Ran Out of Food in the Last Year: Never true  Transportation Needs: No Transportation Needs  . Lack of Transportation (Medical): No  . Lack of Transportation (Non-Medical): No  Physical Activity: Unknown  . Days of Exercise per Week: 0 days  . Minutes of Exercise per Session: Not on file  Stress: No Stress Concern Present  . Feeling of Stress : Only a little  Social Connections: Unknown  . Frequency of Communication with Friends and Family: More than three times a week  . Frequency of Social Gatherings with Friends and Family: More than three times a week  . Attends Religious Services: Not on file  . Active Member of Clubs or Organizations: Not on file  . Attends Archivist Meetings: Not on file  . Marital Status: Not on file  Intimate Partner Violence: Not At Risk  . Fear of Current or Ex-Partner: No  . Emotionally Abused: No  . Physically Abused: No  . Sexually Abused: No  Family History  Problem Relation Age of Onset  . ALS Father   . Polymyositis Father   . Diabetes Brother   . Dementia Mother   . Cancer Maternal Aunt        breast  . Breast cancer Maternal Aunt        30's  . Stroke Maternal Grandmother   . Cancer Maternal Grandfather        prostate  . Stroke Maternal Grandfather   . Colon cancer Maternal Aunt   . Non-Hodgkin's lymphoma Cousin      Current Outpatient Medications:  .  amiodarone (PACERONE) 200 MG tablet, Take 1 tablet (200 mg total) by mouth daily., Disp: 30 tablet, Rfl: 1 .  amitriptyline (ELAVIL) 25 MG tablet, Take 1 tablet (25 mg total) by mouth at bedtime. May increase weekly as needed, Disp: 90 tablet, Rfl: 3 .  apixaban (ELIQUIS) 2.5 MG TABS tablet, Take 1 tablet (2.5 mg total) by mouth 2 (two) times daily., Disp: 60 tablet, Rfl: 1 .  atorvastatin (LIPITOR) 10 MG tablet, Take 1 tablet (10 mg total) by mouth daily., Disp: 90  tablet, Rfl: 0 .  cholecalciferol (VITAMIN D3) 25 MCG (1000 UNIT) tablet, Take 1,000 Units by mouth in the morning and at bedtime., Disp: , Rfl:  .  CRANBERRY PO, Take 1 tablet by mouth daily., Disp: , Rfl:  .  cyanocobalamin (,VITAMIN B-12,) 1000 MCG/ML injection, Inject 1 mL (1,000 mcg total) into the muscle every 14 (fourteen) days., Disp: 2 mL, Rfl: 11 .  empagliflozin (JARDIANCE) 10 MG TABS tablet, Take 1 tablet (10 mg total) by mouth daily., Disp: 30 tablet, Rfl: 5 .  furosemide (LASIX) 20 MG tablet, Take 1 tablet (20 mg total) by mouth as needed for fluid or edema., Disp: 90 tablet, Rfl: 0 .  ivabradine (CORLANOR) 5 MG TABS tablet, Take 1 tablet (5 mg total) by mouth 2 (two) times daily with a meal., Disp: 60 tablet, Rfl: 5 .  levothyroxine (SYNTHROID) 75 MCG tablet, Take 1 tablet (75 mcg total) by mouth daily before breakfast., Disp: 30 tablet, Rfl: 2 .  LORazepam (ATIVAN) 0.5 MG tablet, Take 1 tablet (0.5 mg total) by mouth every 6 (six) hours as needed for anxiety (and to help with breathing)., Disp: 120 tablet, Rfl: 0 .  Morphine Sulfate (MORPHINE CONCENTRATE) 10 mg / 0.5 ml concentrated solution, Take 0.25 mLs (5 mg total) by mouth every 6 (six) hours as needed for severe pain or shortness of breath., Disp: 30 mL, Rfl: 0 .  oxyCODONE (OXY IR/ROXICODONE) 5 MG immediate release tablet, Take 1 tablet (5 mg total) by mouth every 4 (four) hours as needed for severe pain., Disp: 30 tablet, Rfl: 0 .  pantoprazole (PROTONIX) 20 MG tablet, Take 1 tablet (20 mg total) by mouth daily., Disp: 90 tablet, Rfl: 1 .  potassium chloride (KLOR-CON) 10 MEQ tablet, Take 1 tablet (10 mEq total) by mouth daily as needed (Take on days that you take the Lasix.)., Disp: 90 tablet, Rfl: 0 No current facility-administered medications for this visit.  Facility-Administered Medications Ordered in Other Visits:  .  heparin lock flush 100 unit/mL, 500 Units, Intravenous, Once, Randa Evens C, MD .  sodium chloride  flush (NS) 0.9 % injection 10 mL, 10 mL, Intravenous, PRN, Sindy Guadeloupe, MD, 10 mL at 12/07/19 0914 .  sodium chloride flush (NS) 0.9 % injection 10 mL, 10 mL, Intravenous, PRN, Sindy Guadeloupe, MD, 10 mL at 01/04/20 0850 .  sodium chloride  flush (NS) 0.9 % injection 10 mL, 10 mL, Intravenous, PRN, Sindy Guadeloupe, MD, 10 mL at 09/23/20 1219  Physical exam:  Vitals:   09/23/20 1144  BP: (S) (!) 99/54  Pulse: 76  Resp: 16  Temp: (!) 97.3 F (36.3 C)  TempSrc: Tympanic  Weight: 132 lb 9.6 oz (60.1 kg)   Physical Exam Constitutional:      General: She is not in acute distress. Cardiovascular:     Rate and Rhythm: Normal rate and regular rhythm.     Heart sounds: Normal heart sounds.  Pulmonary:     Effort: Pulmonary effort is normal.     Breath sounds: Normal breath sounds.  Abdominal:     General: Bowel sounds are normal. There is no distension.     Palpations: Abdomen is soft.  Skin:    General: Skin is warm and dry.  Neurological:     Mental Status: She is alert and oriented to person, place, and time.      CMP Latest Ref Rng & Units 09/23/2020  Glucose 70 - 99 mg/dL 131(H)  BUN 8 - 23 mg/dL 32(H)  Creatinine 0.44 - 1.00 mg/dL 2.31(H)  Sodium 135 - 145 mmol/L 135  Potassium 3.5 - 5.1 mmol/L 5.0  Chloride 98 - 111 mmol/L 101  CO2 22 - 32 mmol/L 23  Calcium 8.9 - 10.3 mg/dL 9.4  Total Protein 6.5 - 8.1 g/dL 7.2  Total Bilirubin 0.3 - 1.2 mg/dL 0.9  Alkaline Phos 38 - 126 U/L 282(H)  AST 15 - 41 U/L 47(H)  ALT 0 - 44 U/L 18   CBC Latest Ref Rng & Units 09/23/2020  WBC 4.0 - 10.5 K/uL 26.3(H)  Hemoglobin 12.0 - 15.0 g/dL 11.0(L)  Hematocrit 36.0 - 46.0 % 35.3(L)  Platelets 150 - 400 K/uL 239      Assessment and plan- Patient is a 70 y.o. female with recurrent metastatic high-grade endometrial carcinoma with progression on multiple lines of treatment and currently on the best supportive care.  She is here for routine follow-up  Patient looks more frail today as  compared to a month ago.  She has lost 6 pounds of weight.  She has ongoing intermittent vaginal bleeding.  Also reports nausea and vomiting once a week.  She is still able to keep up with her ADLs.  She is continuing to have decent oral intake.  She lives with her sister-in-law presently and has not had any falls.  No recent hospitalizations.  Patient is on multiple cardiac medications which prevent her enrollment into hospice.  She was hypotensive in our office today with a blood pressure in the 90s And mildly symptomatic with some dizziness.  We did give her 500 cc of IV fluids today we will reach out to her cardiology team and I have asked the patient to call them as well to see if her blood pressure medications could be adjusted.  Patient understands that her overall prognosis is poor likely in weeks.  Her 70st birthday is coming up next week.  We will continue to support her through this.  Leukocytosis: Possibly secondary to malignancy.  No signs and symptoms of infection.  I will hold off on empiric antibiotics at this time.  Check a flow cytometry with her next set of labs.  Differential mainly shows neutrophilia likely suggestive of her reactive cause.   Visit Diagnosis 1. Endometrial adenocarcinoma ([place])   2. Peritoneal carcinomatosis [facility])      Dr. Randa Evens, MD,  [facility] [phone] 09/23/2020 2:02 PM

## 2020-09-23 NOTE — Telephone Encounter (Signed)
Sent in basket to dr [name] about her vs and we gave her 500 mg NS over 1 hour. She felt better after the fluids.

## 2020-09-27 DIAGNOSIS — J9601 Acute respiratory failure with hypoxia: Secondary | ICD-10-CM | POA: Diagnosis not present

## 2020-09-27 DIAGNOSIS — I509 Heart failure, unspecified: Secondary | ICD-10-CM | POA: Diagnosis not present

## 2020-10-07 ENCOUNTER — Other Ambulatory Visit: Payer: Self-pay | Admitting: Oncology

## 2020-10-08 ENCOUNTER — Encounter: Payer: Self-pay | Admitting: Oncology

## 2020-10-09 ENCOUNTER — Other Ambulatory Visit: Payer: Self-pay | Admitting: *Deleted

## 2020-10-09 ENCOUNTER — Encounter: Payer: Self-pay | Admitting: Oncology

## 2020-10-09 MED ORDER — OXYCODONE HCL 5 MG PO TABS: 5.0000 mg | ORAL_TABLET | ORAL | 0 refills | Status: AC | PRN

## 2020-10-10 ENCOUNTER — Encounter: Payer: Self-pay | Admitting: Oncology

## 2020-10-10 ENCOUNTER — Other Ambulatory Visit: Payer: Self-pay

## 2020-10-10 ENCOUNTER — Inpatient Hospital Stay
Admission: EM | Admit: 2020-10-10 | Discharge: 2020-10-13 | DRG: 982 | Disposition: E | Payer: PPO | Attending: Internal Medicine | Admitting: Internal Medicine

## 2020-10-10 DIAGNOSIS — D62 Acute posthemorrhagic anemia: Principal | ICD-10-CM | POA: Diagnosis present

## 2020-10-10 DIAGNOSIS — N179 Acute kidney failure, unspecified: Secondary | ICD-10-CM | POA: Diagnosis present

## 2020-10-10 DIAGNOSIS — N1832 Chronic kidney disease, stage 3b: Secondary | ICD-10-CM | POA: Diagnosis not present

## 2020-10-10 DIAGNOSIS — Z800 Family history of malignant neoplasm of digestive organs: Secondary | ICD-10-CM

## 2020-10-10 DIAGNOSIS — Z7901 Long term (current) use of anticoagulants: Secondary | ICD-10-CM

## 2020-10-10 DIAGNOSIS — C786 Secondary malignant neoplasm of retroperitoneum and peritoneum: Secondary | ICD-10-CM | POA: Diagnosis present

## 2020-10-10 DIAGNOSIS — I5022 Chronic systolic (congestive) heart failure: Secondary | ICD-10-CM | POA: Diagnosis present

## 2020-10-10 DIAGNOSIS — I959 Hypotension, unspecified: Secondary | ICD-10-CM | POA: Diagnosis present

## 2020-10-10 DIAGNOSIS — N939 Abnormal uterine and vaginal bleeding, unspecified: Secondary | ICD-10-CM | POA: Diagnosis not present

## 2020-10-10 DIAGNOSIS — T451X5A Adverse effect of antineoplastic and immunosuppressive drugs, initial encounter: Secondary | ICD-10-CM | POA: Diagnosis not present

## 2020-10-10 DIAGNOSIS — Z818 Family history of other mental and behavioral disorders: Secondary | ICD-10-CM

## 2020-10-10 DIAGNOSIS — I471 Supraventricular tachycardia: Secondary | ICD-10-CM | POA: Diagnosis not present

## 2020-10-10 DIAGNOSIS — R102 Pelvic and perineal pain: Secondary | ICD-10-CM | POA: Diagnosis not present

## 2020-10-10 DIAGNOSIS — Z833 Family history of diabetes mellitus: Secondary | ICD-10-CM

## 2020-10-10 DIAGNOSIS — I130 Hypertensive heart and chronic kidney disease with heart failure and stage 1 through stage 4 chronic kidney disease, or unspecified chronic kidney disease: Secondary | ICD-10-CM | POA: Diagnosis not present

## 2020-10-10 DIAGNOSIS — C7982 Secondary malignant neoplasm of genital organs: Secondary | ICD-10-CM | POA: Diagnosis not present

## 2020-10-10 DIAGNOSIS — Z823 Family history of stroke: Secondary | ICD-10-CM

## 2020-10-10 DIAGNOSIS — Z79899 Other long term (current) drug therapy: Secondary | ICD-10-CM

## 2020-10-10 DIAGNOSIS — D72829 Elevated white blood cell count, unspecified: Secondary | ICD-10-CM | POA: Diagnosis not present

## 2020-10-10 DIAGNOSIS — Z888 Allergy status to other drugs, medicaments and biological substances status: Secondary | ICD-10-CM

## 2020-10-10 DIAGNOSIS — Z91048 Other nonmedicinal substance allergy status: Secondary | ICD-10-CM

## 2020-10-10 DIAGNOSIS — I2510 Atherosclerotic heart disease of native coronary artery without angina pectoris: Secondary | ICD-10-CM | POA: Diagnosis not present

## 2020-10-10 DIAGNOSIS — G893 Neoplasm related pain (acute) (chronic): Secondary | ICD-10-CM | POA: Diagnosis present

## 2020-10-10 DIAGNOSIS — C541 Malignant neoplasm of endometrium: Secondary | ICD-10-CM | POA: Diagnosis not present

## 2020-10-10 DIAGNOSIS — G620 Drug-induced polyneuropathy: Secondary | ICD-10-CM | POA: Diagnosis not present

## 2020-10-10 DIAGNOSIS — Z955 Presence of coronary angioplasty implant and graft: Secondary | ICD-10-CM

## 2020-10-10 DIAGNOSIS — I9589 Other hypotension: Secondary | ICD-10-CM | POA: Diagnosis present

## 2020-10-10 DIAGNOSIS — Z87891 Personal history of nicotine dependence: Secondary | ICD-10-CM

## 2020-10-10 DIAGNOSIS — Z923 Personal history of irradiation: Secondary | ICD-10-CM

## 2020-10-10 DIAGNOSIS — E872 Acidosis: Secondary | ICD-10-CM | POA: Diagnosis not present

## 2020-10-10 DIAGNOSIS — Z90710 Acquired absence of both cervix and uterus: Secondary | ICD-10-CM | POA: Diagnosis not present

## 2020-10-10 DIAGNOSIS — R0602 Shortness of breath: Secondary | ICD-10-CM

## 2020-10-10 DIAGNOSIS — Z9049 Acquired absence of other specified parts of digestive tract: Secondary | ICD-10-CM

## 2020-10-10 DIAGNOSIS — Z20822 Contact with and (suspected) exposure to covid-19: Secondary | ICD-10-CM | POA: Diagnosis present

## 2020-10-10 DIAGNOSIS — I255 Ischemic cardiomyopathy: Secondary | ICD-10-CM | POA: Diagnosis present

## 2020-10-10 DIAGNOSIS — Z882 Allergy status to sulfonamides status: Secondary | ICD-10-CM

## 2020-10-10 DIAGNOSIS — K219 Gastro-esophageal reflux disease without esophagitis: Secondary | ICD-10-CM | POA: Diagnosis present

## 2020-10-10 DIAGNOSIS — R578 Other shock: Secondary | ICD-10-CM | POA: Diagnosis present

## 2020-10-10 DIAGNOSIS — Z8571 Personal history of Hodgkin lymphoma: Secondary | ICD-10-CM

## 2020-10-10 DIAGNOSIS — F419 Anxiety disorder, unspecified: Secondary | ICD-10-CM | POA: Diagnosis present

## 2020-10-10 DIAGNOSIS — E039 Hypothyroidism, unspecified: Secondary | ICD-10-CM | POA: Diagnosis present

## 2020-10-10 DIAGNOSIS — I420 Dilated cardiomyopathy: Secondary | ICD-10-CM | POA: Diagnosis not present

## 2020-10-10 DIAGNOSIS — Z86711 Personal history of pulmonary embolism: Secondary | ICD-10-CM | POA: Diagnosis not present

## 2020-10-10 DIAGNOSIS — Z66 Do not resuscitate: Secondary | ICD-10-CM | POA: Diagnosis present

## 2020-10-10 DIAGNOSIS — Z79890 Hormone replacement therapy (postmenopausal): Secondary | ICD-10-CM

## 2020-10-10 DIAGNOSIS — Z515 Encounter for palliative care: Secondary | ICD-10-CM | POA: Diagnosis not present

## 2020-10-10 DIAGNOSIS — Z803 Family history of malignant neoplasm of breast: Secondary | ICD-10-CM

## 2020-10-10 DIAGNOSIS — E875 Hyperkalemia: Secondary | ICD-10-CM | POA: Diagnosis present

## 2020-10-10 DIAGNOSIS — E861 Hypovolemia: Secondary | ICD-10-CM | POA: Diagnosis present

## 2020-10-10 DIAGNOSIS — D630 Anemia in neoplastic disease: Secondary | ICD-10-CM | POA: Diagnosis present

## 2020-10-10 DIAGNOSIS — N938 Other specified abnormal uterine and vaginal bleeding: Secondary | ICD-10-CM | POA: Diagnosis not present

## 2020-10-10 DIAGNOSIS — Z807 Family history of other malignant neoplasms of lymphoid, hematopoietic and related tissues: Secondary | ICD-10-CM

## 2020-10-10 DIAGNOSIS — C55 Malignant neoplasm of uterus, part unspecified: Secondary | ICD-10-CM | POA: Diagnosis not present

## 2020-10-10 DIAGNOSIS — I429 Cardiomyopathy, unspecified: Secondary | ICD-10-CM

## 2020-10-10 DIAGNOSIS — D649 Anemia, unspecified: Secondary | ICD-10-CM

## 2020-10-10 LAB — COMPREHENSIVE METABOLIC PANEL
ALT: 21 U/L (ref 0–44)
AST: 57 U/L — ABNORMAL HIGH (ref 15–41)
Albumin: 3 g/dL — ABNORMAL LOW (ref 3.5–5.0)
Alkaline Phosphatase: 313 U/L — ABNORMAL HIGH (ref 38–126)
Anion gap: 13 (ref 5–15)
BUN: 27 mg/dL — ABNORMAL HIGH (ref 8–23)
CO2: 17 mmol/L — ABNORMAL LOW (ref 22–32)
Calcium: 9.3 mg/dL (ref 8.9–10.3)
Chloride: 105 mmol/L (ref 98–111)
Creatinine, Ser: 1.92 mg/dL — ABNORMAL HIGH (ref 0.44–1.00)
GFR, Estimated: 28 mL/min — ABNORMAL LOW (ref >60)
Glucose, Bld: 165 mg/dL — ABNORMAL HIGH (ref 70–99)
Potassium: 5.9 mmol/L — ABNORMAL HIGH (ref 3.5–5.1)
Sodium: 135 mmol/L (ref 135–145)
Total Bilirubin: 1.2 mg/dL (ref 0.3–1.2)
Total Protein: 6.5 g/dL (ref 6.5–8.1)

## 2020-10-10 LAB — CBC WITH DIFFERENTIAL/PLATELET
Abs Immature Granulocytes: 1.34 10*3/uL — ABNORMAL HIGH (ref 0.00–0.07)
Basophils Absolute: 0.2 10*3/uL — ABNORMAL HIGH (ref 0.0–0.1)
Basophils Relative: 1 %
Eosinophils Absolute: 0.2 10*3/uL (ref 0.0–0.5)
Eosinophils Relative: 0 %
HCT: 26.2 % — ABNORMAL LOW (ref 36.0–46.0)
Hemoglobin: 8 g/dL — ABNORMAL LOW (ref 12.0–15.0)
Immature Granulocytes: 3 %
Lymphocytes Relative: 4 %
Lymphs Abs: 2 10*3/uL (ref 0.7–4.0)
MCH: 28.5 pg (ref 26.0–34.0)
MCHC: 30.5 g/dL (ref 30.0–36.0)
MCV: 93.2 fL (ref 80.0–100.0)
Monocytes Absolute: 1.2 10*3/uL — ABNORMAL HIGH (ref 0.1–1.0)
Monocytes Relative: 3 %
Neutro Abs: 41.5 10*3/uL — ABNORMAL HIGH (ref 1.7–7.7)
Neutrophils Relative %: 89 %
Platelets: 333 10*3/uL (ref 150–400)
RBC: 2.81 MIL/uL — ABNORMAL LOW (ref 3.87–5.11)
RDW: 18.6 % — ABNORMAL HIGH (ref 11.5–15.5)
Smear Review: NORMAL
WBC: 46.4 10*3/uL — ABNORMAL HIGH (ref 4.0–10.5)
nRBC: 0 % (ref 0.0–0.2)

## 2020-10-10 LAB — TROPONIN I (HIGH SENSITIVITY): Troponin I (High Sensitivity): 14 ng/L (ref <18)

## 2020-10-10 LAB — PROTIME-INR
INR: 1.5 — ABNORMAL HIGH (ref 0.8–1.2)
Prothrombin Time: 18.4 seconds — ABNORMAL HIGH (ref 11.4–15.2)

## 2020-10-10 LAB — RESP PANEL BY RT-PCR (FLU A&B, COVID) ARPGX2
Influenza A by PCR: NEGATIVE
Influenza B by PCR: NEGATIVE
SARS Coronavirus 2 by RT PCR: NEGATIVE

## 2020-10-10 MED ORDER — CIPROFLOXACIN HCL 250 MG PO TABS: 250.0000 mg | ORAL_TABLET | Freq: Two times a day (BID) | ORAL | 0 refills | Status: AC

## 2020-10-10 MED ORDER — HYDROCODONE-ACETAMINOPHEN 5-325 MG PO TABS
1.0000 | ORAL_TABLET | ORAL | Status: DC | PRN
  Administered 2020-10-10: 2 via ORAL
  Filled 2020-10-10: qty 2

## 2020-10-10 MED ORDER — ACETAMINOPHEN 650 MG RE SUPP: 650.0000 mg | Freq: Four times a day (QID) | RECTAL | Status: DC | PRN

## 2020-10-10 MED ORDER — SODIUM CHLORIDE 0.9% IV SOLUTION
Freq: Once | INTRAVENOUS | Status: DC
  Filled 2020-10-10: qty 250

## 2020-10-10 MED ORDER — ONDANSETRON HCL 4 MG/2ML IJ SOLN
4.0000 mg | Freq: Four times a day (QID) | INTRAMUSCULAR | Status: DC | PRN
  Administered 2020-10-11 (×2): 4 mg via INTRAVENOUS
  Filled 2020-10-10: qty 2

## 2020-10-10 MED ORDER — ONDANSETRON HCL 4 MG PO TABS
4.0000 mg | ORAL_TABLET | Freq: Four times a day (QID) | ORAL | Status: DC | PRN
  Filled 2020-10-10: qty 1

## 2020-10-10 MED ORDER — ACETAMINOPHEN 325 MG PO TABS
650.0000 mg | ORAL_TABLET | Freq: Four times a day (QID) | ORAL | Status: DC | PRN
  Filled 2020-10-10: qty 2

## 2020-10-10 MED ORDER — MORPHINE SULFATE (PF) 4 MG/ML IV SOLN
4.0000 mg | Freq: Once | INTRAVENOUS | Status: AC
  Administered 2020-10-10: 4 mg via INTRAVENOUS
  Filled 2020-10-10: qty 1

## 2020-10-10 MED ORDER — LACTATED RINGERS IV BOLUS
500.0000 mL | Freq: Once | INTRAVENOUS | Status: AC
  Administered 2020-10-10: 500 mL via INTRAVENOUS

## 2020-10-10 NOTE — H&P (Signed)
History and Physical    Jaime Hall [id number] DOB: 1950-06-17 DOA: 10/04/2020  PCP: [name], MD   Patient coming from: Home  I have personally briefly reviewed patient's old medical records in [place]  Chief Complaint: Vaginal bleeding  HPI: Jaime Hall is a 70 y.o. female with medical history significant for metastatic endometrial cancer s/p hysterectomy in 2019 and chemoradiation, with reoccurrence, now on palliative care, CKD 3b, HFrEF with dilated cardiomyopathy, chemotherapy-induced peripheral neuropathy, chronic anticoagulation with Eliquis for history of PE, hypothyroidism, anxiety and chronic pain, who presents to the emergency room with heavy and continuous vaginal bleeding x2 days, worse on standing, requiring hourly changes to sanitary pads.  It was preceded by several week history of light intermittent bleeding.  She denies pain beyond her baseline cancer related pain.  Denies nausea vomiting or fever or change in bowel habits.  Denies lightheadedness, chest pain or palpitations or shortness of breath.  Was advised by her oncologist that if it gets worse she might require an ablation.  ED Course: On arrival, hypotensive at 83/42 with pulse 72 O2 sat 98% on room air.  Blood work significant for hemoglobin of 8, down from 11 a few weeks prior.  Creatinine at baseline at 1.92, mildly elevated potassium at 5.9.  Troponin of 14.  COVID and flu negative EKG as reviewed by me : Normal sinus at 78 with nonspecific ST-T wave changes  Patient was given an IV bolus in the emergency roomWith improvement in BP to 109/45 by admission.  The emergency room provider spoke with OB/GYN, Dr. Ouida Sills who recommended transfer for palliative radiation however per EDP, patient declined transfer opting to stay at this facility.  Hospitalist consulted for admission.     Review of Systems: As per HPI otherwise all other systems on review of systems negative.    Past  Medical History:  Diagnosis Date  . Anxiety   . Cervicalgia   . Chronic kidney disease   . Coronary artery disease    a. 02/2012 Stress echo: severe anterior wall ischemia;  b. 02/2012 Cath/PCI: LAD 95p (3.0 x 15 Xience EX DES), D1 90ost (PTCA - bifurcational dzs), EF 45% with anterior HK;  c. 02/2013 Ex MV: fixed anterior defect w/ minor reversibility, nl EF-->Med Rx; d. 11/2019 MV: EF 41%, ant/antlat ischemia-->high risk scan; e. 11/2019 Cath: LM nl, LAD 40ost/p, patent LAD stent, LCX nl, RCA nl, RPDA/RPAV nl, EF 30  . Endometrial cancer ([place])    a. 07/2016 s/p robotic hysterectomy, BSO w/ washings, sentinel node inj, mapping, bx, adhesiolysis.  . Essential hypertension, benign   . Fibrocystic breast disease   . GERD (gastroesophageal reflux disease)   . Gestational hypertension   . Heart murmur   . HFrEF (heart failure with reduced ejection fraction) ([place])    a. 05/2020 Echo: EF <20%, glob HK.  Marland Kitchen History of anemia   . History of blood transfusion   . Hodgkin's lymphoma ([place]) 2011   a. s/p radiation and chemo therapy  . Mixed Ischemic and Nonischemic Cardiomyopathy    a. 05/2020 Echo: EF <20%, glob HK, Nl RV size/fxn, mildly dil LA. Mild MR. Mild to mod Ao sclerosis.  . Osteoarthritis   . Polycystic ovarian disease     Past Surgical History:  Procedure Laterality Date  . ABDOMINAL HYSTERECTOMY    . bladder sling    . CARDIAC CATHETERIZATION  02/2012   [facility] 1 stent place  . CERVICAL POLYPECTOMY    .  CHOLECYSTECTOMY  1982  . COLONOSCOPY WITH PROPOFOL N/A 02/05/2015   Procedure: COLONOSCOPY WITH PROPOFOL;  Surgeon: Lucilla Lame, MD;  Location: [facility] END[facility]COPY;  Service: Endoscopy;  Laterality: N/A;  . CORONARY ANGIOPLASTY  02/2012   left/right s/p balloon  . CYSTOGRAM N/A 08/17/2016   Procedure: CYSTOGRAM;  Surgeon: Hollice Espy, MD;  Location: [facility] ORS;  Service: Urology;  Laterality: N/A;  . CYSTOSCOPY N/A 08/17/2016   Procedure: CYSTOSCOPY EXAM UNDER ANESTHESIA;  Surgeon: Hollice Espy, MD;  Location: [facility] ORS;  Service: Urology;  Laterality: N/A;  . CYSTOSCOPY W/ RETROGRADES Bilateral 08/17/2016   Procedure: CYSTOSCOPY WITH RETROGRADE PYELOGRAM;  Surgeon: Hollice Espy, MD;  Location: [facility] OR[facility];  Service: Urology;  Laterality: Bilateral;  . CYSTOSCOPY WITH STENT PLACEMENT Right 08/17/2016   Procedure: CYSTOSCOPY WITH STENT PLACEMENT;  Surgeon: Hollice Espy, MD;  Location: [facility];  Service: Urology;  Laterality: Right;  . heart stent'  2013  . kidney stent Right 2018  . LYMPH NODE BIOPSY  2011   diagnosis of hodgkins lymphoma  . PELVIC LYMPH NODE DISSECTION N/A 07/29/2016   Procedure: PELVIC/AORTIC LYMPH NODE SAMPLING;  Surgeon: [name], MD;  Location: [facility] OR[facility];  Service: Gynecology;  Laterality: N/A;  . PORTA CATH INSERTION N/A 09/22/2016   Procedure: Glori Luis Cath Insertion;  Surgeon: [name], MD;  Location: [place] [facility];  Service: Cardiovascular;  Laterality: N/A;  . PORTA CATH INSERTION N/A 10/27/2019   Procedure: PORTA CATH INSERTION;  Surgeon: [name], MD;  Location: [place] [facility];  Service: Cardiovascular;  Laterality: N/A;  . PORTA CATH REMOVAL N/A 11/17/2016   Procedure: Glori Luis Cath Removal;  Surgeon: [name], MD;  Location: [place] [facility];  Service: Cardiovascular;  Laterality: N/A;  . RIGHT/LEFT HEART CATH AND CORONARY ANGIOGRAPHY N/A 12/04/2019   Procedure: RIGHT/LEFT HEART CATH AND CORONARY ANGIOGRAPHY;  Surgeon: Wellington Hampshire, MD;  Location: [place] [facility];  Service: Cardiovascular;  Laterality: N/A;  . ROBOTIC ASSISTED TOTAL HYSTERECTOMY WITH BILATERAL SALPINGO OOPHERECTOMY N/A 07/29/2016   Procedure: ROBOTIC ASSISTED TOTAL HYSTERECTOMY WITH BILATERAL SALPINGO OOPHORECTOMY;  Surgeon: [name], MD;  Location: [facility];  Service: Gynecology;  Laterality: N/A;  . SENTINEL NODE BIOPSY N/A 07/29/2016   Procedure: SENTINEL NODE BIOPSY;  Surgeon: [name], MD;   Location: [facility];  Service: Gynecology;  Laterality: N/A;  . transobturator sling N/A 2009   [place]     reports that she quit smoking about 18 years ago. Her smoking use included cigarettes. She has a 30.00 pack-year smoking history. She has never used smokeless tobacco. She reports previous alcohol use. She reports that she does not use drugs.  Allergies  Allergen Reactions  . Metoprolol Other (See Comments)    Fatigue  . Adhesive [Tape] Other (See Comments)    Skin Irritation   . Antifungal [Miconazole Nitrate] Rash  . Sulfa Antibiotics Nausea And Vomiting and Rash  . Z-Pak [Azithromycin] Itching    Family History  Problem Relation Age of Onset  . ALS Father   . Polymyositis Father   . Diabetes Brother   . Dementia Mother   . Cancer Maternal Aunt        breast  . Breast cancer Maternal Aunt        30's  . Stroke Maternal Grandmother   . Cancer Maternal Grandfather        prostate  . Stroke Maternal Grandfather   . Colon cancer Maternal Aunt   .  Non-Hodgkin's lymphoma Cousin       Prior to Admission medications   Medication Sig Start Date End Date Taking? Authorizing Provider  amiodarone (PACERONE) 200 MG tablet Take 1 tablet (200 mg total) by mouth daily. 07/31/20   Dunn, Areta Haber, PA-C  amitriptyline (ELAVIL) 25 MG tablet Take 1 tablet (25 mg total) by mouth at bedtime. May increase weekly as needed 04/02/20   [name], MD  atorvastatin (LIPITOR) 10 MG tablet Take 1 tablet (10 mg total) by mouth daily. 07/23/20   Wellington Hampshire, MD  cholecalciferol (VITAMIN D3) 25 MCG (1000 UNIT) tablet Take 1,000 Units by mouth in the morning and at bedtime.    [provider]  ciprofloxacin (CIPRO) 250 MG tablet Take 1 tablet (250 mg total) by mouth 2 (two) times daily for 5 days. 09/16/2020 10/15/20  Sindy Guadeloupe, MD  CRANBERRY PO Take 1 tablet by mouth daily.    [provider]  cyanocobalamin (,VITAMIN B-12,) 1000 MCG/ML injection Inject 1 mL (1,000 mcg  total) into the muscle every 14 (fourteen) days. 07/26/20   [name], MD  ELIQUIS 2.5 MG TABS tablet TAKE 1 TABLET BY MOUTH TWICE A DAY 10/07/20   Sindy Guadeloupe, MD  empagliflozin (JARDIANCE) 10 MG TABS tablet Take 1 tablet (10 mg total) by mouth daily. 07/16/20   Loel Dubonnet, NP  furosemide (LASIX) 20 MG tablet Take 1 tablet (20 mg total) by mouth as needed for fluid or edema. 07/23/20   Wellington Hampshire, MD  ivabradine (CORLANOR) 5 MG TABS tablet Take 1 tablet (5 mg total) by mouth 2 (two) times daily with a meal. 07/16/20   Loel Dubonnet, NP  levothyroxine (SYNTHROID) 75 MCG tablet Take 1 tablet (75 mcg total) by mouth daily before breakfast. 07/15/20   Sindy Guadeloupe, MD  LORazepam (ATIVAN) 0.5 MG tablet Take 1 tablet (0.5 mg total) by mouth every 6 (six) hours as needed for anxiety (and to help with breathing). 02/01/20   Sindy Guadeloupe, MD  Morphine Sulfate (MORPHINE CONCENTRATE) 10 mg / 0.5 ml concentrated solution Take 0.25 mLs (5 mg total) by mouth every 6 (six) hours as needed for severe pain or shortness of breath. 02/27/20   Sindy Guadeloupe, MD  oxyCODONE (OXY IR/ROXICODONE) 5 MG immediate release tablet Take 1 tablet (5 mg total) by mouth every 4 (four) hours as needed for severe pain. 10/09/20   Sindy Guadeloupe, MD  pantoprazole (PROTONIX) 20 MG tablet Take 1 tablet (20 mg total) by mouth daily. 07/26/20   [name], MD  potassium chloride (KLOR-CON) 10 MEQ tablet Take 1 tablet (10 mEq total) by mouth daily as needed (Take on days that you take the Lasix.). 07/23/20   Wellington Hampshire, MD  prochlorperazine (COMPAZINE) 10 MG tablet Take 1 tablet (10 mg total) by mouth every 6 (six) hours as needed (Nausea or vomiting). Patient taking differently: Take 10 mg by mouth daily as needed for nausea or vomiting.  10/25/19 05/15/20  Sindy Guadeloupe, MD    Physical Exam: Vitals:   10/03/2020 1748 09/20/2020 1753 10/08/2020 1841 10/02/2020 2015  BP: (!) 83/42  (!) 120/45 (!) 109/45  Pulse: 72 78  73 70  Resp: 20  12 14   Temp: (!) 97.5 F (36.4 C)     TempSrc: Oral     SpO2:  98% 97% 95%  Weight:  59.9 kg    Height:  5\' 1"  (1.549 m)  Vitals:   09/24/2020 1748 09/19/2020 1753 09/30/2020 1841 09/15/2020 2015  BP: (!) 83/42  (!) 120/45 (!) 109/45  Pulse: 72 78 73 70  Resp: 20  12 14   Temp: (!) 97.5 F (36.4 C)     TempSrc: Oral     SpO2:  98% 97% 95%  Weight:  59.9 kg    Height:  5\' 1"  (1.549 m)      Constitutional: Alert and oriented x 3 . Not in any apparent distress HEENT:      Head: Normocephalic and atraumatic.         Eyes: PERLA, EOMI, Conjunctivae are normal. Sclera is non-icteric.       Mouth/Throat: Mucous membranes are moist and pale.       Neck: Supple with no signs of meningismus. Cardiovascular: Regular rate and rhythm. No murmurs, gallops, or rubs. 2+ symmetrical distal pulses are present . No JVD. No LE edema Respiratory: Respiratory effort normal .Lungs sounds clear bilaterally. No wheezes, crackles, or rhonchi.  Gastrointestinal: Soft, non tender, and non distended with positive bowel sounds.  Genitourinary: No CVA tenderness.(fungating mass seen on speculum exam in ED) Musculoskeletal: Nontender with normal range of motion in all extremities. No cyanosis, or erythema of extremities. Neurologic:  Face is symmetric. Moving all extremities. No gross focal neurologic deficits . Skin: Skin is warm, dry.  No rash or ulcers Psychiatric: Mood and affect are normal    Labs on Admission: I have personally reviewed following labs and imaging studies  CBC: Recent Labs  Lab 10/11/2020 1809  WBC 46.4*  NEUTROABS 41.5*  HGB 8.0*  HCT 26.2*  MCV 93.2  PLT 0000000   Basic Metabolic Panel: Recent Labs  Lab 09/16/2020 1809  NA 135  K 5.9*  CL 105  CO2 17*  GLUCOSE 165*  BUN 27*  CREATININE 1.92*  CALCIUM 9.3   GFR: Estimated Creatinine Clearance: 22.3 mL/min (A) (by C-G formula based on SCr of 1.92 mg/dL (H)). Liver Function Tests: Recent Labs  Lab  09/14/2020 1809  AST 57*  ALT 21  ALKPHOS 313*  BILITOT 1.2  PROT 6.5  ALBUMIN 3.0*   No results for input(s): LIPASE, AMYLASE in the last 168 hours. No results for input(s): AMMONIA in the last 168 hours. Coagulation Profile: Recent Labs  Lab 10/09/2020 1834  INR 1.5*   Cardiac Enzymes: No results for input(s): CKTOTAL, CKMB, CKMBINDEX, TROPONINI in the last 168 hours. BNP (last 3 results) No results for input(s): PROBNP in the last 8760 hours. HbA1C: No results for input(s): HGBA1C in the last 72 hours. CBG: No results for input(s): GLUCAP in the last 168 hours. Lipid Profile: No results for input(s): CHOL, HDL, LDLCALC, TRIG, CHOLHDL, LDLDIRECT in the last 72 hours. Thyroid Function Tests: No results for input(s): TSH, T4TOTAL, FREET4, T3FREE, THYROIDAB in the last 72 hours. Anemia Panel: No results for input(s): VITAMINB12, FOLATE, FERRITIN, TIBC, IRON, RETICCTPCT in the last 72 hours. Urine analysis:    Component Value Date/Time   COLORURINE RED (A) 08/12/2020 1620   APPEARANCEUR (A) 08/12/2020 1620    TEST NOT REPORTED DUE TO COLOR INTERFERENCE OF URINE PIGMENT   APPEARANCEUR Clear 09/24/2016 0945   LABSPEC 1.022 08/12/2020 1620   PHURINE  08/12/2020 1620    TEST NOT REPORTED DUE TO COLOR INTERFERENCE OF URINE PIGMENT   GLUCOSEU (A) 08/12/2020 1620    TEST NOT REPORTED DUE TO COLOR INTERFERENCE OF URINE PIGMENT   GLUCOSEU NEGATIVE 07/07/2016 1418   HGBUR (A) 08/12/2020 1620  TEST NOT REPORTED DUE TO COLOR INTERFERENCE OF URINE PIGMENT   BILIRUBINUR (A) 08/12/2020 1620    TEST NOT REPORTED DUE TO COLOR INTERFERENCE OF URINE PIGMENT   BILIRUBINUR large (A) 08/12/2020 1040   BILIRUBINUR negative 10/02/2019 1627   BILIRUBINUR Negative 09/24/2016 0945   KETONESUR (A) 08/12/2020 1620    TEST NOT REPORTED DUE TO COLOR INTERFERENCE OF URINE PIGMENT   PROTEINUR (A) 08/12/2020 1620    TEST NOT REPORTED DUE TO COLOR INTERFERENCE OF URINE PIGMENT   UROBILINOGEN 4.0 (A)  08/12/2020 1040   UROBILINOGEN 0.2 07/07/2016 1418   NITRITE (A) 08/12/2020 1620    TEST NOT REPORTED DUE TO COLOR INTERFERENCE OF URINE PIGMENT   LEUKOCYTESUR (A) 08/12/2020 1620    TEST NOT REPORTED DUE TO COLOR INTERFERENCE OF URINE PIGMENT    Radiological Exams on Admission: No results found.   Assessment/Plan 70 year old female with history of metastatic endometrial cancer s/p hysterectomy in 2019 and chemoradiation, with reoccurrence, now on palliative care, CKD 3b, HFrEF with dilated cardiomyopathy, chemotherapy-induced peripheral neuropathy, chronic anticoagulation with Eliquis for history of PE, hypothyroidism, anxiety and chronic pain, presenting with heavy and continuous vaginal bleeding x2 days  Acute blood loss anemia secondary to vaginal bleeding associated with endometrial cancer Chronic anticoagulation on Eliquis for history of PE - Hemoglobin of 8, down from 11 a few weeks prior - Monitor H&H and transfuse as needed - Hold Eliquis.  SCDs (no chemoprophylaxis )for DVT prophylaxis - Patient declined transfer to another facility for palliative radiation/ablation - Hemodynamic monitoring - We will keep n.p.o. after midnight in case of procedure in the a.m. - IV hydration monitor with monitoring for fluid overload given history of systolic heart failure -  palliative and oncology and OBGYN consults in the am     Chronic systolic CHF secondary to dilated cardiomyopathy - Not acutely exacerbated - Continue to monitor fluid status in view of IV hydration/likely blood transfusions - Hold furosemide due to hypotension - Continue amiodarone as BP will tolerate. - Not currently on beta-blockers or ACE/ARB likely due to chronic hypotension and renal failure respectively  Acute on chronic hypotension, secondary to hypovolemia - Hypotensive on arrival at 80s over 40s with improvement with IV fluid bolus - Baseline blood pressure reportedly low - Likely related to acute blood  loss - Blood transfusion as well as IV hydration    Coronary artery disease - No complaints of chest pain and EKG is nonacute - Not currently on aspirin, beta-blockers.  Continue atorvastatin    Endometrial cancer with peritoneal metastases Chemotherapy-induced peripheral neuropathy ([facility]) Chronic cancer related pain - Oncology consult in the a.m. - Continue amitriptyline pending med rec - Continue morphine and oxycodone from home pending med rec    Stage 3b chronic kidney disease ([place]) - At baseline     DVT prophylaxis: SCDs Code Status: DNR Family Communication:  None Disposition Plan: Back to previous home environment Consults called: none  Status:At the time of admission, it appears that the appropriate admission status for this patient is INPATIENT. This is judged to be reasonable and necessary in order to provide the required intensity of service to ensure the patient's safety given the presenting symptoms, physical exam findings, and initial radiographic and laboratory data in the context of their  Comorbid conditions.   Patient requires inpatient status due to high intensity of service, high risk for further deterioration and high frequency of surveillance required.   I certify that at the point of admission it is  my clinical judgment that the patient will require inpatient hospital care spanning beyond [place] MD [facility]     09/23/2020, 8:51 PM

## 2020-10-10 NOTE — ED Provider Notes (Signed)
[facility] Emergency Department Provider Note   ____________________________________________   Event Date/Time   First MD Initiated Contact with Patient 10/05/2020 1812     (approximate)  I have reviewed the triage vital signs and the nursing notes.   HISTORY  Chief Complaint Vaginal Bleeding    HPI Jaime Hall is a 70 y.o. female with past medical history of hypertension, CKD, CAD, CHF, PE on Eliquis, and metastatic endometrial cancer who presents to the ED complaining of vaginal bleeding.  Patient reports that she is no longer a candidate for treatment for her metastatic endometrial cancer, she did have hysterectomy in 2019 prior to recurrence of her disease.  She has been dealing with light vaginal bleeding for the past few weeks, but states it got much more severe over the past couple of days.  She states that blood pours out of her whenever she stands up and she has having to change a pad almost hourly.  She denies any associated abdominal pain beyond her chronic cancer associated pain.  She has not had any fevers, nausea, vomiting, or dysuria.  She was told by her oncologist previously that if bleeding were to get worse she might require an ablation.        Past Medical History:  Diagnosis Date  . Anxiety   . Cervicalgia   . Chronic kidney disease   . Coronary artery disease    a. 02/2012 Stress echo: severe anterior wall ischemia;  b. 02/2012 Cath/PCI: LAD 95p (3.0 x 15 Xience EX DES), D1 90ost (PTCA - bifurcational dzs), EF 45% with anterior HK;  c. 02/2013 Ex MV: fixed anterior defect w/ minor reversibility, nl EF-->Med Rx; d. 11/2019 MV: EF 41%, ant/antlat ischemia-->high risk scan; e. 11/2019 Cath: LM nl, LAD 40ost/p, patent LAD stent, LCX nl, RCA nl, RPDA/RPAV nl, EF 30  . Endometrial cancer ([place])    a. 07/2016 s/p robotic hysterectomy, BSO w/ washings, sentinel node inj, mapping, bx, adhesiolysis.  . Essential hypertension, benign   .  Fibrocystic breast disease   . GERD (gastroesophageal reflux disease)   . Gestational hypertension   . Heart murmur   . HFrEF (heart failure with reduced ejection fraction) ([place])    a. 05/2020 Echo: EF <20%, glob HK.  Marland Kitchen History of anemia   . History of blood transfusion   . Hodgkin's lymphoma ([place]) 2011   a. s/p radiation and chemo therapy  . Mixed Ischemic and Nonischemic Cardiomyopathy    a. 05/2020 Echo: EF <20%, glob HK, Nl RV size/fxn, mildly dil LA. Mild MR. Mild to mod Ao sclerosis.  . Osteoarthritis   . Polycystic ovarian disease     Patient Active Problem List   Diagnosis Date Noted  . Sepsis ([place]) 08/13/2020  . Chronic systolic CHF (congestive heart failure) ([place]) 07/20/2020  . SVT (supraventricular tachycardia) ([place]) 07/20/2020  . Cardiomyopathy ([place]) 07/20/2020  . Hypotension 07/20/2020  . NSTEMI (non-ST elevated myocardial infarction) ([place]) 07/20/2020  . Acute on chronic combined systolic and diastolic CHF (congestive heart failure) ([place]) 05/19/2020  . PE (pulmonary thromboembolism) ([place]) 05/19/2020  . GERD (gastroesophageal reflux disease) 05/19/2020  . UTI (urinary tract infection) 05/19/2020  . Hospital discharge follow-up 03/02/2020  . Hemoptysis 02/20/2020  . Acute on chronic congestive heart failure ([place])   . Palliative care encounter   . Acute pulmonary embolism ([place]) 02/14/2020  . Thrombocytopenia ([place]) 02/14/2020  . Hypothyroidism 02/14/2020  . Chest pain 12/29/2019  . Leukocytosis 12/29/2019  . HTN (hypertension)  12/29/2019  . HLD (hyperlipidemia) 12/29/2019  . Elevated troponin 12/29/2019  . Stage 3b chronic kidney disease ([place]) 12/29/2019  . Depression with anxiety 12/29/2019  . Abnormal cardiovascular stress test   . Dyspnea on exertion   . Goals of care, counseling/discussion 10/20/2019  . 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 Peritoneal metastases ([place]) 10/16/2019  . Right anterior knee pain 07/25/2019  . Educated about COVID-19  virus infection 11/15/2018  . Cramps, muscle, general 04/06/2018  . B12 deficiency anemia 07/24/2017  . Normocytic anemia 07/18/2017  . Back pain 07/18/2017  . Chemotherapy-induced peripheral neuropathy ([place]) 12/10/2016  . Hyperlipidemia 08/13/2016  . Hx of colonic polyps 08/13/2016  . Benign neoplasm of sigmoid colon 08/13/2016  . Endometrial cancer ([place]) 08/13/2016  . 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 Lymphedema 07/20/2016  . Chronic venous insufficiency 07/20/2016  . PAD (peripheral artery disease) ([place]) 07/20/2016  . Class 1 obesity due to excess calories without serious comorbidity with body mass index (BMI) of 34.0 to 34.9 in adult 05/27/2016  . Leg cramps 05/27/2016  . Bronchiectasis ([place]) 12/19/2015  . Pre-syncope 12/19/2015  . Prolapsed, uterovaginal, incomplete 06/24/2014  . Routine general medical examination at a health care facility 12/30/2012  . Obesity (BMI 30-39.9) 12/30/2012  . Hx of multiple pulmonary nodules 12/28/2012  . Plantar fasciitis of right foot 12/28/2012  . Hip pain, bilateral 09/12/2012  . History of Hodgkin's lymphoma 09/12/2012  . Coronary artery disease   . Chest pain on exertion 02/29/2012    Past Surgical History:  Procedure Laterality Date  . ABDOMINAL HYSTERECTOMY    . bladder sling    . CARDIAC CATHETERIZATION  02/2012   [facility] 1 stent place  . CERVICAL POLYPECTOMY    . CHOLECYSTECTOMY  1982  . COLONOSCOPY WITH PROPOFOL N/A 02/05/2015   Procedure: COLONOSCOPY WITH PROPOFOL;  Surgeon: Lucilla Lame, MD;  Location: [facility] ENDOSCOPY;  Service: Endoscopy;  Laterality: N/A;  . CORONARY ANGIOPLASTY  02/2012   left/right s/p balloon  . CYSTOGRAM N/A 08/17/2016   Procedure: CYSTOGRAM;  Surgeon: Hollice Espy, MD;  Location: [facility] ORS;  Service: Urology;  Laterality: N/A;  . CYSTOSCOPY N/A 08/17/2016   Procedure: CYSTOSCOPY EXAM UNDER ANESTHESIA;  Surgeon: Hollice Espy, MD;  Location: [facility] ORS;  Service: Urology;   Laterality: N/A;  . CYSTOSCOPY W/ RETROGRADES Bilateral 08/17/2016   Procedure: CYSTOSCOPY WITH RETROGRADE PYELOGRAM;  Surgeon: Hollice Espy, MD;  Location: [facility] ORS;  Service: Urology;  Laterality: Bilateral;  . CYSTOSCOPY WITH STENT PLACEMENT Right 08/17/2016   Procedure: CYSTOSCOPY WITH STENT PLACEMENT;  Surgeon: Hollice Espy, MD;  Location: [facility] ORS;  Service: Urology;  Laterality: Right;  . heart stent'  2013  . kidney stent Right 2018  . LYMPH NODE BIOPSY  2011   diagnosis of hodgkins lymphoma  . PELVIC LYMPH NODE DISSECTION N/A 07/29/2016   Procedure: PELVIC/AORTIC LYMPH NODE SAMPLING;  Surgeon: [name], MD;  Location: [facility] ORS;  Service: Gynecology;  Laterality: N/A;  . PORTA CATH INSERTION N/A 09/22/2016   Procedure: Glori Luis Cath Insertion;  Surgeon: [name], MD;  Location: [facility];  Service: Cardiovascular;  Laterality: N/A;  . PORTA CATH INSERTION N/A 10/27/2019   Procedure: PORTA CATH INSERTION;  Surgeon: [name], MD;  Location: [place] [facility];  Service: Cardiovascular;  Laterality: N/A;  . PORTA CATH REMOVAL N/A 11/17/2016   Procedure: Glori Luis Cath Removal;  Surgeon: [name], MD;  Location: [place] [facility];  Service: Cardiovascular;  Laterality: N/A;  . RIGHT/LEFT HEART CATH AND CORONARY ANGIOGRAPHY N/A 12/04/2019   Procedure: RIGHT/LEFT HEART CATH AND CORONARY ANGIOGRAPHY;  Surgeon: Wellington Hampshire, MD;  Location: [place] [facility];  Service: Cardiovascular;  Laterality: N/A;  . ROBOTIC ASSISTED TOTAL HYSTERECTOMY WITH BILATERAL SALPINGO OOPHERECTOMY N/A 07/29/2016   Procedure: ROBOTIC ASSISTED TOTAL HYSTERECTOMY WITH BILATERAL SALPINGO OOPHORECTOMY;  Surgeon: [name], MD;  Location: [facility] ORS;  Service: Gynecology;  Laterality: N/A;  . SENTINEL NODE BIOPSY N/A 07/29/2016   Procedure: SENTINEL NODE BIOPSY;  Surgeon: [name], MD;  Location: [facility] ORS;  Service: Gynecology;  Laterality: N/A;   . transobturator sling N/A 2009   [place]    Prior to Admission medications   Medication Sig Start Date End Date Taking? Authorizing Provider  amiodarone (PACERONE) 200 MG tablet Take 1 tablet (200 mg total) by mouth daily. 07/31/20   Dunn, Areta Haber, PA-C  amitriptyline (ELAVIL) 25 MG tablet Take 1 tablet (25 mg total) by mouth at bedtime. May increase weekly as needed 04/02/20   [name], MD  atorvastatin (LIPITOR) 10 MG tablet Take 1 tablet (10 mg total) by mouth daily. 07/23/20   Wellington Hampshire, MD  cholecalciferol (VITAMIN D3) 25 MCG (1000 UNIT) tablet Take 1,000 Units by mouth in the morning and at bedtime.    [provider]  ciprofloxacin (CIPRO) 250 MG tablet Take 1 tablet (250 mg total) by mouth 2 (two) times daily for 5 days. 09/16/2020 10/15/20  Sindy Guadeloupe, MD  CRANBERRY PO Take 1 tablet by mouth daily.    [provider]  cyanocobalamin (,VITAMIN B-12,) 1000 MCG/ML injection Inject 1 mL (1,000 mcg total) into the muscle every 14 (fourteen) days. 07/26/20   [name], MD  ELIQUIS 2.5 MG TABS tablet TAKE 1 TABLET BY MOUTH TWICE A DAY 10/07/20   Sindy Guadeloupe, MD  empagliflozin (JARDIANCE) 10 MG TABS tablet Take 1 tablet (10 mg total) by mouth daily. 07/16/20   Loel Dubonnet, NP  furosemide (LASIX) 20 MG tablet Take 1 tablet (20 mg total) by mouth as needed for fluid or edema. 07/23/20   Wellington Hampshire, MD  ivabradine (CORLANOR) 5 MG TABS tablet Take 1 tablet (5 mg total) by mouth 2 (two) times daily with a meal. 07/16/20   Loel Dubonnet, NP  levothyroxine (SYNTHROID) 75 MCG tablet Take 1 tablet (75 mcg total) by mouth daily before breakfast. 07/15/20   Sindy Guadeloupe, MD  LORazepam (ATIVAN) 0.5 MG tablet Take 1 tablet (0.5 mg total) by mouth every 6 (six) hours as needed for anxiety (and to help with breathing). 02/01/20   Sindy Guadeloupe, MD  Morphine Sulfate (MORPHINE CONCENTRATE) 10 mg / 0.5 ml concentrated solution Take 0.25 mLs (5 mg total) by mouth  every 6 (six) hours as needed for severe pain or shortness of breath. 02/27/20   Sindy Guadeloupe, MD  oxyCODONE (OXY IR/ROXICODONE) 5 MG immediate release tablet Take 1 tablet (5 mg total) by mouth every 4 (four) hours as needed for severe pain. 10/09/20   Sindy Guadeloupe, MD  pantoprazole (PROTONIX) 20 MG tablet Take 1 tablet (20 mg total) by mouth daily. 07/26/20   [name], MD  potassium chloride (KLOR-CON) 10 MEQ tablet Take 1 tablet (10 mEq total) by mouth daily as needed (Take on days that you take the Lasix.). 07/23/20   Wellington Hampshire, MD  prochlorperazine (COMPAZINE) 10 MG tablet Take  1 tablet (10 mg total) by mouth every 6 (six) hours as needed (Nausea or vomiting). Patient taking differently: Take 10 mg by mouth daily as needed for nausea or vomiting.  10/25/19 05/15/20  Sindy Guadeloupe, MD    Allergies Metoprolol, Adhesive [tape], Antifungal [miconazole nitrate], Sulfa antibiotics, and Z-pak [azithromycin]  Family History  Problem Relation Age of Onset  . ALS Father   . Polymyositis Father   . Diabetes Brother   . Dementia Mother   . Cancer Maternal Aunt        breast  . Breast cancer Maternal Aunt        30's  . Stroke Maternal Grandmother   . Cancer Maternal Grandfather        prostate  . Stroke Maternal Grandfather   . Colon cancer Maternal Aunt   . Non-Hodgkin's lymphoma Cousin     Social History Social History   Tobacco Use  . Smoking status: Former Smoker    Packs/day: 1.00    Years: 30.00    Pack years: 30.00    Types: Cigarettes    Quit date: 07/24/2002    Years since quitting: 18.2  . Smokeless tobacco: Never Used  . Tobacco comment: quit smoking in 2000  Vaping Use  . Vaping Use: Never used  Substance Use Topics  . Alcohol use: Not Currently  . Drug use: No    Review of Systems  Constitutional: No fever/chills Eyes: No visual changes. ENT: No sore throat. Cardiovascular: Denies chest pain. Respiratory: Denies shortness of  breath. Gastrointestinal: No abdominal pain.  No nausea, no vomiting.  No diarrhea.  No constipation. Genitourinary: Negative for dysuria.  Positive for vaginal bleeding. Musculoskeletal: Negative for back pain. Skin: Negative for rash. Neurological: Negative for headaches, focal weakness or numbness.  ____________________________________________   PHYSICAL EXAM:  VITAL SIGNS: ED Triage Vitals  Enc Vitals Group     BP 09/25/2020 1748 (!) 83/42     Pulse Rate 09/22/2020 1748 72     Resp 10/03/2020 1748 20     Temp 09/27/2020 1748 (!) 97.5 F (36.4 C)     Temp Source 10/04/2020 1748 Oral     SpO2 09/27/2020 1753 98 %     Weight 09/25/2020 1753 132 lb (59.9 kg)     Height 10/11/2020 1753 5\' 1"  (1.549 m)     Head Circumference --      Peak Flow --      Pain Score 10/01/2020 1753 8     Pain Loc --      Pain Edu? --      Excl. in [place]? --     Constitutional: Alert and oriented. Eyes: Conjunctivae are normal. Head: Atraumatic. Nose: No congestion/rhinnorhea. Mouth/Throat: Mucous membranes are moist. Neck: Normal ROM Cardiovascular: Normal rate, regular rhythm. Grossly normal heart sounds. Respiratory: Normal respiratory effort.  No retractions. Lungs CTAB. Gastrointestinal: Soft and nontender. No distention. Genitourinary: Pilar Plate blood from vaginal cuff on speculum exam with fungating mass. Musculoskeletal: No lower extremity tenderness nor edema. Neurologic:  Normal speech and language. No gross focal neurologic deficits are appreciated. Skin:  Skin is warm, dry and intact. No rash noted. Psychiatric: Mood and affect are normal. Speech and behavior are normal.  ____________________________________________   LABS (all labs ordered are listed, but only abnormal results are displayed)  Labs Reviewed  CBC WITH DIFFERENTIAL/PLATELET - Abnormal; Notable for the following components:      Result Value   WBC 46.4 (*)    RBC 2.81 (*)  Hemoglobin 8.0 (*)    HCT 26.2 (*)    RDW 18.6 (*)     Neutro Abs 41.5 (*)    Monocytes Absolute 1.2 (*)    Basophils Absolute 0.2 (*)    Abs Immature Granulocytes 1.34 (*)    All other components within normal limits  COMPREHENSIVE METABOLIC PANEL - Abnormal; Notable for the following components:   Potassium 5.9 (*)    CO2 17 (*)    Glucose, Bld 165 (*)    BUN 27 (*)    Creatinine, Ser 1.92 (*)    Albumin 3.0 (*)    AST 57 (*)    Alkaline Phosphatase 313 (*)    GFR, Estimated 28 (*)    All other components within normal limits  PROTIME-INR - Abnormal; Notable for the following components:   Prothrombin Time 18.4 (*)    INR 1.5 (*)    All other components within normal limits  RESP PANEL BY RT-PCR (FLU A&B, COVID) [id number]  TYPE AND SCREEN  TROPONIN I (HIGH SENSITIVITY)  TROPONIN I (HIGH SENSITIVITY)    PROCEDURES  Procedure(s) performed (including Critical Care):  Procedures   ____________________________________________   INITIAL IMPRESSION / ASSESSMENT AND PLAN / ED COURSE       70 year old female with past medical history of hypertension, CKD, CHF, CAD, PE on Eliquis, and endometrial cancer who presents to the ED with worsening vaginal bleeding requiring her to change her pad about once an hour.  She has significant bleeding on pelvic exam appearing to come from fungating mass near her vaginal cuff.  She is chronically hypotensive and takes midodrine, currently awake alert and hemodynamically stable.  Labs do show drop in hemoglobin from 11 a few weeks ago to 8 today.  She also has a leukocytosis, likely related to her malignancy and there is no evidence to suggest acute infection.  Case discussed with Dr. Ouida Sills of OB/GYN, who states that patient is unfortunately not a candidate for ablation to help with her bleeding.  He recommends consideration of transfer for radiation therapy.  Patient declines this and is not interested in radiation.  Plan to discuss with hospitalist for admission for oncology and palliative care  involvement.      ____________________________________________   FINAL CLINICAL IMPRESSION(S) / ED DIAGNOSES  Final diagnoses:  Vaginal bleeding  Endometrial cancer [facility])     ED Discharge Orders    None       Note:  This document was prepared using Dragon voice recognition software and may include unintentional dictation errors.   Blake Divine, MD 10/01/2020 2025

## 2020-10-10 NOTE — ED Triage Notes (Signed)
Pt states she has inoperable endometrial cancer, states bleeding started a few days ago and has gotten worse over the past few days. Pt states she did have burning with urination but took AZO and that stopped. Pt states she is using a new maxi pad every hour or so and the blood is bright red. Pt also states she had abdominal pain and N/V x 2 days.

## 2020-10-11 ENCOUNTER — Encounter: Admission: EM | Disposition: E | Payer: Self-pay | Source: Home / Self Care | Attending: Internal Medicine

## 2020-10-11 ENCOUNTER — Other Ambulatory Visit (INDEPENDENT_AMBULATORY_CARE_PROVIDER_SITE_OTHER): Payer: Self-pay | Admitting: Vascular Surgery

## 2020-10-11 ENCOUNTER — Ambulatory Visit: Payer: PPO | Admitting: Radiation Oncology

## 2020-10-11 ENCOUNTER — Ambulatory Visit: Payer: PPO | Attending: Radiation Oncology

## 2020-10-11 ENCOUNTER — Inpatient Hospital Stay: Payer: PPO | Admitting: Hospice and Palliative Medicine

## 2020-10-11 ENCOUNTER — Other Ambulatory Visit: Payer: Self-pay

## 2020-10-11 DIAGNOSIS — I420 Dilated cardiomyopathy: Secondary | ICD-10-CM | POA: Diagnosis not present

## 2020-10-11 DIAGNOSIS — G620 Drug-induced polyneuropathy: Secondary | ICD-10-CM | POA: Diagnosis not present

## 2020-10-11 DIAGNOSIS — I959 Hypotension, unspecified: Secondary | ICD-10-CM

## 2020-10-11 DIAGNOSIS — Z515 Encounter for palliative care: Secondary | ICD-10-CM | POA: Diagnosis not present

## 2020-10-11 DIAGNOSIS — Z86711 Personal history of pulmonary embolism: Secondary | ICD-10-CM

## 2020-10-11 DIAGNOSIS — D72829 Elevated white blood cell count, unspecified: Secondary | ICD-10-CM

## 2020-10-11 DIAGNOSIS — C55 Malignant neoplasm of uterus, part unspecified: Secondary | ICD-10-CM

## 2020-10-11 DIAGNOSIS — N938 Other specified abnormal uterine and vaginal bleeding: Secondary | ICD-10-CM

## 2020-10-11 DIAGNOSIS — R578 Other shock: Secondary | ICD-10-CM

## 2020-10-11 DIAGNOSIS — D62 Acute posthemorrhagic anemia: Principal | ICD-10-CM

## 2020-10-11 DIAGNOSIS — N939 Abnormal uterine and vaginal bleeding, unspecified: Secondary | ICD-10-CM | POA: Diagnosis not present

## 2020-10-11 DIAGNOSIS — C541 Malignant neoplasm of endometrium: Secondary | ICD-10-CM

## 2020-10-11 DIAGNOSIS — D649 Anemia, unspecified: Secondary | ICD-10-CM

## 2020-10-11 DIAGNOSIS — I471 Supraventricular tachycardia: Secondary | ICD-10-CM

## 2020-10-11 DIAGNOSIS — I2510 Atherosclerotic heart disease of native coronary artery without angina pectoris: Secondary | ICD-10-CM

## 2020-10-11 DIAGNOSIS — T451X5A Adverse effect of antineoplastic and immunosuppressive drugs, initial encounter: Secondary | ICD-10-CM

## 2020-10-11 DIAGNOSIS — N1832 Chronic kidney disease, stage 3b: Secondary | ICD-10-CM

## 2020-10-11 DIAGNOSIS — I5022 Chronic systolic (congestive) heart failure: Secondary | ICD-10-CM

## 2020-10-11 HISTORY — PX: EMBOLIZATION: CATH118239

## 2020-10-11 LAB — COMPREHENSIVE METABOLIC PANEL
ALT: 23 U/L (ref 0–44)
AST: 52 U/L — ABNORMAL HIGH (ref 15–41)
Albumin: 2.8 g/dL — ABNORMAL LOW (ref 3.5–5.0)
Alkaline Phosphatase: 302 U/L — ABNORMAL HIGH (ref 38–126)
Anion gap: 10 (ref 5–15)
BUN: 32 mg/dL — ABNORMAL HIGH (ref 8–23)
CO2: 19 mmol/L — ABNORMAL LOW (ref 22–32)
Calcium: 9 mg/dL (ref 8.9–10.3)
Chloride: 106 mmol/L (ref 98–111)
Creatinine, Ser: 2.75 mg/dL — ABNORMAL HIGH (ref 0.44–1.00)
GFR, Estimated: 18 mL/min — ABNORMAL LOW (ref >60)
Glucose, Bld: 129 mg/dL — ABNORMAL HIGH (ref 70–99)
Potassium: 5.8 mmol/L — ABNORMAL HIGH (ref 3.5–5.1)
Sodium: 135 mmol/L (ref 135–145)
Total Bilirubin: 1.2 mg/dL (ref 0.3–1.2)
Total Protein: 5.9 g/dL — ABNORMAL LOW (ref 6.5–8.1)

## 2020-10-11 LAB — HEMOGLOBIN
Hemoglobin: 6.3 g/dL — ABNORMAL LOW (ref 12.0–15.0)
Hemoglobin: 8.2 g/dL — ABNORMAL LOW (ref 12.0–15.0)

## 2020-10-11 LAB — CBC WITH DIFFERENTIAL/PLATELET
Abs Immature Granulocytes: 2.35 10*3/uL — ABNORMAL HIGH (ref 0.00–0.07)
Basophils Absolute: 0.3 10*3/uL — ABNORMAL HIGH (ref 0.0–0.1)
Basophils Relative: 0 %
Eosinophils Absolute: 0 10*3/uL (ref 0.0–0.5)
Eosinophils Relative: 0 %
HCT: 25.7 % — ABNORMAL LOW (ref 36.0–46.0)
Hemoglobin: 8.5 g/dL — ABNORMAL LOW (ref 12.0–15.0)
Immature Granulocytes: 4 %
Lymphocytes Relative: 2 %
Lymphs Abs: 1.1 10*3/uL (ref 0.7–4.0)
MCH: 29.1 pg (ref 26.0–34.0)
MCHC: 33.1 g/dL (ref 30.0–36.0)
MCV: 88 fL (ref 80.0–100.0)
Monocytes Absolute: 1.7 10*3/uL — ABNORMAL HIGH (ref 0.1–1.0)
Monocytes Relative: 3 %
Neutro Abs: 50.8 10*3/uL — ABNORMAL HIGH (ref 1.7–7.7)
Neutrophils Relative %: 91 %
Platelets: 319 10*3/uL (ref 150–400)
RBC: 2.92 MIL/uL — ABNORMAL LOW (ref 3.87–5.11)
RDW: 18.1 % — ABNORMAL HIGH (ref 11.5–15.5)
Smear Review: NORMAL
WBC: 56.2 10*3/uL (ref 4.0–10.5)
nRBC: 0 % (ref 0.0–0.2)

## 2020-10-11 LAB — PREPARE RBC (CROSSMATCH)

## 2020-10-11 LAB — MAGNESIUM: Magnesium: 2 mg/dL (ref 1.7–2.4)

## 2020-10-11 SURGERY — EMBOLIZATION
Anesthesia: Moderate Sedation

## 2020-10-11 MED ORDER — LABETALOL HCL 5 MG/ML IV SOLN: 10.0000 mg | INTRAVENOUS | Status: DC | PRN

## 2020-10-11 MED ORDER — SODIUM ZIRCONIUM CYCLOSILICATE 5 G PO PACK
5.0000 g | PACK | Freq: Two times a day (BID) | ORAL | Status: DC
  Administered 2020-10-11: 5 g via ORAL
  Filled 2020-10-11 (×2): qty 1

## 2020-10-11 MED ORDER — SODIUM CHLORIDE 0.9 % IV SOLN: INTRAVENOUS | Status: DC

## 2020-10-11 MED ORDER — FAMOTIDINE 20 MG PO TABS: 40.0000 mg | ORAL_TABLET | Freq: Once | ORAL | Status: DC | PRN

## 2020-10-11 MED ORDER — OXYCODONE HCL 5 MG PO TABS
5.0000 mg | ORAL_TABLET | ORAL | Status: DC | PRN
  Administered 2020-10-11: 5 mg via ORAL
  Administered 2020-10-12: 10 mg via ORAL
  Filled 2020-10-11: qty 2
  Filled 2020-10-11: qty 1

## 2020-10-11 MED ORDER — CEFAZOLIN SODIUM-DEXTROSE 2-4 GM/100ML-% IV SOLN: 2.0000 g | Freq: Once | INTRAVENOUS | Status: DC

## 2020-10-11 MED ORDER — HYDROMORPHONE HCL 1 MG/ML IJ SOLN
1.0000 mg | Freq: Once | INTRAMUSCULAR | Status: AC | PRN
Start: 2020-10-11 — End: 2020-10-11
  Administered 2020-10-11: 1 mg via INTRAVENOUS
  Filled 2020-10-11: qty 1

## 2020-10-11 MED ORDER — AMIODARONE HCL 200 MG PO TABS
200.0000 mg | ORAL_TABLET | Freq: Every day | ORAL | Status: DC
  Administered 2020-10-11: 200 mg via ORAL
  Filled 2020-10-11: qty 1

## 2020-10-11 MED ORDER — FENTANYL CITRATE (PF) 100 MCG/2ML IJ SOLN
INTRAMUSCULAR | Status: AC
  Filled 2020-10-11: qty 2

## 2020-10-11 MED ORDER — LEVOTHYROXINE SODIUM 50 MCG PO TABS
75.0000 ug | ORAL_TABLET | Freq: Every day | ORAL | Status: DC
  Administered 2020-10-12: 75 ug via ORAL
  Filled 2020-10-11: qty 1

## 2020-10-11 MED ORDER — HEPARIN SODIUM (PORCINE) 1000 UNIT/ML IJ SOLN
INTRAMUSCULAR | Status: DC | PRN
  Administered 2020-10-11: 2000 [IU] via INTRAVENOUS

## 2020-10-11 MED ORDER — ONDANSETRON HCL 4 MG/2ML IJ SOLN: 4.0000 mg | Freq: Four times a day (QID) | INTRAMUSCULAR | Status: DC | PRN

## 2020-10-11 MED ORDER — SODIUM CHLORIDE 0.9% FLUSH: 3.0000 mL | INTRAVENOUS | Status: DC | PRN

## 2020-10-11 MED ORDER — SODIUM CHLORIDE 0.9% IV SOLUTION: Freq: Once | INTRAVENOUS | Status: AC

## 2020-10-11 MED ORDER — MORPHINE SULFATE 4 MG/ML IJ SOLN: 2.0000 mg | INTRAMUSCULAR | Status: DC | PRN

## 2020-10-11 MED ORDER — HEPARIN SODIUM (PORCINE) 1000 UNIT/ML IJ SOLN
INTRAMUSCULAR | Status: AC
  Filled 2020-10-11: qty 1

## 2020-10-11 MED ORDER — KETOROLAC TROMETHAMINE 30 MG/ML IJ SOLN
30.0000 mg | Freq: Four times a day (QID) | INTRAMUSCULAR | Status: DC | PRN
  Administered 2020-10-11: 30 mg via INTRAVENOUS
  Filled 2020-10-11: qty 1

## 2020-10-11 MED ORDER — SODIUM CHLORIDE 0.9 % IV SOLN: 250.0000 mL | INTRAVENOUS | Status: DC | PRN

## 2020-10-11 MED ORDER — ACETAMINOPHEN 325 MG PO TABS: 650.0000 mg | ORAL_TABLET | ORAL | Status: DC | PRN

## 2020-10-11 MED ORDER — FENTANYL CITRATE (PF) 100 MCG/2ML IJ SOLN
INTRAMUSCULAR | Status: DC | PRN
  Administered 2020-10-11 (×3): 25 ug via INTRAVENOUS

## 2020-10-11 MED ORDER — HYDRALAZINE HCL 20 MG/ML IJ SOLN: 5.0000 mg | INTRAMUSCULAR | Status: DC | PRN

## 2020-10-11 MED ORDER — DIPHENHYDRAMINE HCL 50 MG/ML IJ SOLN: 50.0000 mg | Freq: Once | INTRAMUSCULAR | Status: DC | PRN

## 2020-10-11 MED ORDER — TRANEXAMIC ACID 650 MG PO TABS
1300.0000 mg | ORAL_TABLET | Freq: Three times a day (TID) | ORAL | Status: DC
  Administered 2020-10-11 – 2020-10-12 (×2): 1300 mg via ORAL
  Filled 2020-10-11 (×6): qty 2

## 2020-10-11 MED ORDER — MIDAZOLAM HCL 2 MG/ML PO SYRP: 8.0000 mg | ORAL_SOLUTION | Freq: Once | ORAL | Status: DC | PRN

## 2020-10-11 MED ORDER — CHLORHEXIDINE GLUCONATE CLOTH 2 % EX PADS
6.0000 | MEDICATED_PAD | Freq: Every day | CUTANEOUS | Status: DC
  Administered 2020-10-11: 6 via TOPICAL

## 2020-10-11 MED ORDER — AMITRIPTYLINE HCL 25 MG PO TABS
25.0000 mg | ORAL_TABLET | Freq: Every day | ORAL | Status: DC
  Administered 2020-10-11: 25 mg via ORAL
  Filled 2020-10-11 (×2): qty 1

## 2020-10-11 MED ORDER — IODIXANOL 320 MG/ML IV SOLN
INTRAVENOUS | Status: DC | PRN
  Administered 2020-10-11: 45 mL

## 2020-10-11 MED ORDER — SODIUM CHLORIDE 0.9 % IV SOLN: INTRAVENOUS | Status: AC

## 2020-10-11 MED ORDER — KETOROLAC TROMETHAMINE 30 MG/ML IJ SOLN
7.5000 mg | Freq: Four times a day (QID) | INTRAMUSCULAR | Status: DC | PRN
Start: 2020-10-11 — End: 2020-10-11

## 2020-10-11 MED ORDER — OXYCODONE HCL 5 MG PO TABS
5.0000 mg | ORAL_TABLET | ORAL | Status: DC | PRN
  Administered 2020-10-11: 5 mg via ORAL
  Filled 2020-10-11: qty 1

## 2020-10-11 MED ORDER — SENNA 8.6 MG PO TABS
1.0000 | ORAL_TABLET | Freq: Two times a day (BID) | ORAL | Status: DC
  Administered 2020-10-11 – 2020-10-12 (×2): 8.6 mg via ORAL
  Filled 2020-10-11 (×2): qty 1

## 2020-10-11 MED ORDER — POLYETHYLENE GLYCOL 3350 17 G PO PACK
17.0000 g | PACK | Freq: Every day | ORAL | Status: DC
  Administered 2020-10-12: 17 g via ORAL
  Filled 2020-10-11: qty 1

## 2020-10-11 MED ORDER — SODIUM CHLORIDE 0.9% FLUSH
3.0000 mL | Freq: Two times a day (BID) | INTRAVENOUS | Status: DC
  Administered 2020-10-11: 3 mL via INTRAVENOUS

## 2020-10-11 MED ORDER — ATORVASTATIN CALCIUM 10 MG PO TABS
10.0000 mg | ORAL_TABLET | Freq: Every day | ORAL | Status: DC
  Administered 2020-10-11 – 2020-10-12 (×2): 10 mg via ORAL
  Filled 2020-10-11 (×2): qty 1

## 2020-10-11 MED ORDER — ENSURE ENLIVE PO LIQD: 237.0000 mL | Freq: Three times a day (TID) | ORAL | Status: DC

## 2020-10-11 MED ORDER — PANTOPRAZOLE SODIUM 40 MG IV SOLR
40.0000 mg | INTRAVENOUS | Status: DC
  Administered 2020-10-11: 40 mg via INTRAVENOUS
  Filled 2020-10-11: qty 40

## 2020-10-11 MED ORDER — METHYLPREDNISOLONE SODIUM SUCC 125 MG IJ SOLR: 125.0000 mg | Freq: Once | INTRAMUSCULAR | Status: DC | PRN

## 2020-10-11 MED ORDER — MIDAZOLAM HCL 5 MG/5ML IJ SOLN
INTRAMUSCULAR | Status: AC
  Filled 2020-10-11: qty 5

## 2020-10-11 MED ORDER — LORAZEPAM 0.5 MG PO TABS: 0.5000 mg | ORAL_TABLET | Freq: Four times a day (QID) | ORAL | Status: DC | PRN

## 2020-10-11 MED ORDER — PANTOPRAZOLE SODIUM 20 MG PO TBEC
20.0000 mg | DELAYED_RELEASE_TABLET | Freq: Every day | ORAL | Status: DC
  Filled 2020-10-11: qty 1

## 2020-10-11 MED ORDER — HYDROMORPHONE HCL 1 MG/ML IJ SOLN: 0.5000 mg | INTRAMUSCULAR | Status: DC | PRN

## 2020-10-11 MED ORDER — MORPHINE SULFATE (CONCENTRATE) 10 MG/0.5ML PO SOLN: 5.0000 mg | Freq: Four times a day (QID) | ORAL | Status: DC | PRN

## 2020-10-11 MED ORDER — CEFAZOLIN SODIUM-DEXTROSE 2-4 GM/100ML-% IV SOLN
INTRAVENOUS | Status: AC
  Administered 2020-10-11: 2 g
  Filled 2020-10-11: qty 100

## 2020-10-11 MED ORDER — MIDAZOLAM HCL 2 MG/2ML IJ SOLN
INTRAMUSCULAR | Status: DC | PRN
  Administered 2020-10-11 (×3): 1 mg via INTRAVENOUS

## 2020-10-11 MED ORDER — ONDANSETRON HCL 4 MG/2ML IJ SOLN
4.0000 mg | Freq: Four times a day (QID) | INTRAMUSCULAR | Status: DC | PRN
  Filled 2020-10-11: qty 2

## 2020-10-11 MED ORDER — SODIUM CHLORIDE 0.9 % IV BOLUS
500.0000 mL | Freq: Once | INTRAVENOUS | Status: AC
  Administered 2020-10-11: 500 mL via INTRAVENOUS

## 2020-10-11 MED ORDER — SODIUM CHLORIDE 0.9 % IV SOLN: 1.0000 g | Freq: Once | INTRAVENOUS | Status: DC

## 2020-10-11 MED ORDER — MORPHINE SULFATE (PF) 4 MG/ML IV SOLN
2.0000 mg | INTRAVENOUS | Status: DC | PRN
  Administered 2020-10-11 – 2020-10-12 (×3): 2 mg via INTRAVENOUS
  Filled 2020-10-11 (×4): qty 1

## 2020-10-11 SURGICAL SUPPLY — 30 items
BLOCK BEAD 500-700 (Vascular Products) ×2 IMPLANT
CANNULA 5F STIFF (CANNULA) ×2 IMPLANT
CATH ANGIO 5F PIGTAIL 65CM (CATHETERS) ×2 IMPLANT
CATH BEACON 5 .035 65 C2 TIP (CATHETERS) ×2 IMPLANT
CATH KUMPE SOFT-VU 5FR 65 (CATHETERS) ×2 IMPLANT
CATH MICROCATH PRGRT 2.8F 110 (CATHETERS) ×2 IMPLANT
CATH TEMPO 5F RIM 65CM (CATHETERS) ×2 IMPLANT
COIL 400 COMPLEX SOFT 3X15CM (Vascular Products) ×2 IMPLANT
COIL 400 COMPLEX SOFT 3X5CM (Vascular Products) ×2 IMPLANT
COIL 400 COMPLEX SOFT 4X15CM (Vascular Products) ×2 IMPLANT
COIL 400 COMPLEX SOFT 4X6CM (Vascular Products) ×6 IMPLANT
COVER PROBE U/S 5X48 (MISCELLANEOUS) ×2 IMPLANT
DEVICE ENSNARE  12MMX20MM (VASCULAR PRODUCTS) ×1
DEVICE ENSNARE 12MMX20MM (VASCULAR PRODUCTS) ×1 IMPLANT
DEVICE STARCLOSE SE CLOSURE (Vascular Products) ×4 IMPLANT
GLIDECATH 4FR STR (CATHETERS) ×2 IMPLANT
GLIDEWIRE ADV .035X180CM (WIRE) ×2 IMPLANT
HANDLE DETACHMENT COIL (MISCELLANEOUS) ×2 IMPLANT
MICROCATH PROGREAT 2.8F 110 CM (CATHETERS) ×4
PACK ANGIOGRAPHY (CUSTOM PROCEDURE TRAY) ×2 IMPLANT
SHEATH BALKIN 6FR (SHEATH) ×2 IMPLANT
SHEATH BRITE TIP 5FRX11 (SHEATH) ×2 IMPLANT
SHEATH BRITE TIP 6FRX11 (SHEATH) ×2 IMPLANT
SHEATH BRITE TIP 6FRX5.5 (SHEATH) IMPLANT
STOPCOCK 3 WAY MALE LL (IV SETS) ×1
STOPCOCK 3WAY MALE LL (IV SETS) ×1 IMPLANT
SYR MEDRAD MARK 7 150ML (SYRINGE) ×2 IMPLANT
TOWEL OR 17X26 4PK STRL BLUE (TOWEL DISPOSABLE) ×2 IMPLANT
TUBING CONTRAST HIGH PRESS 48 (TUBING) ×4 IMPLANT
WIRE GUIDERIGHT .035X150 (WIRE) ×2 IMPLANT

## 2020-10-11 NOTE — Progress Notes (Signed)
Chart review - yellow MEWS.  No LOC charted on 1823 VS. Updated LOC to alert - MEWS change to green.

## 2020-10-11 NOTE — Progress Notes (Signed)
PROGRESS NOTE    Jaime Hall  BPZ:[id number] DOB: [birth year] DOA: 11/06/20 PCP: [name], MD (Confirm with patient/family/NH records and if not entered, this HAS to be entered at [facility] point of entry. "No PCP" if truly none.)   Chief Complaint  Patient presents with  . Vaginal Bleeding    Brief Narrative:  HPI per Dr. Alethia Berthold Jaime Hall is a 70 y.o. female with medical history significant for metastatic endometrial cancer s/p hysterectomy in 2019 and chemoradiation, with reoccurrence, now on palliative care, CKD 3b, HFrEF with dilated cardiomyopathy, chemotherapy-induced peripheral neuropathy, chronic anticoagulation with Eliquis for history of PE, hypothyroidism, anxiety and chronic pain, who presents to the emergency room with heavy and continuous vaginal bleeding x2 days, worse on standing, requiring hourly changes to sanitary pads.  It was preceded by several week history of light intermittent bleeding.  She denies pain beyond her baseline cancer related pain.  Denies nausea vomiting or fever or change in bowel habits.  Denies lightheadedness, chest pain or palpitations or shortness of breath.  Was advised by her oncologist that if it gets worse she might require an ablation.  ED Course: On arrival, hypotensive at 83/42 with pulse 72 O2 sat 98% on room air.  Blood work significant for hemoglobin of 8, down from 11 a few weeks prior.  Creatinine at baseline at 1.92, mildly elevated potassium at 5.9.  Troponin of 14.  COVID and flu negative EKG as reviewed by me : Normal sinus at 78 with nonspecific ST-T wave changes  Patient was given an IV bolus in the emergency roomWith improvement in BP to 109/45 by admission.  The emergency room provider spoke with OB/GYN, Dr. Ouida Sills who recommended transfer for palliative radiation however per EDP, patient declined transfer opting to stay at this facility.  Hospitalist consulted for admission.   Assessment & Plan:   Active  Problems:   Coronary artery disease   Endometrial cancer (HCC)   Chemotherapy-induced peripheral neuropathy (HCC)   Peritoneal metastases (HCC)   Stage 3b chronic kidney disease (HCC)   Chronic systolic CHF (congestive heart failure) (HCC)   Cardiomyopathy (HCC)   Hypotension   Acute blood loss anemia   Chronic anticoagulation   History of pulmonary embolism   Vaginal bleeding   Hemorrhagic shock (HCC)   Symptomatic anemia  1 symptomatic acute blood loss anemia secondary to vaginal bleeding secondary to recurrent endometrial cancer/chronic anticoagulation on Eliquis with prior history of PE -Patient had presented with ongoing significant vaginal bleeding which has been continuous x2 days. -Patient with history of recurrent metastatic endometrial cancer status post hysterectomy in 2019 with chemoradiation being followed by oncology on palliative care. -Hemoglobin noted on admission down to 8 from 11 a few weeks ago. -Hemoglobin dropped as low as 6.3 this morning. -Status post transfusion 1 unit packed red blood cells. -Repeat H&H at 8.5. -Hold Eliquis. -Consulted with oncology, Dr. Janese Banks consultation pending. -Spoke with OB/GYN for formal consult, Dr. Ouida Sills who recommended patient be best served at a tertiary care center for possible palliative radiation as well as starting patient on lysteda as recommended by oncology. -Radiation oncology consulted who assessed the patient. -Vascular surgery consulted who recommended embolization which will be done today for further management. -Follow H&H and transfuse for hemoglobin < 7. -Consult with palliative care.  2.  Acute on chronic hypotension secondary to hemorrhagic shock Secondary to problem #1. -Patient transfused 1 unit packed red blood cells hemoglobin currently at 8.5. -With blood pressure responding. -  Follow H&H. -Transfusion threshold hemoglobin < 7. -IV fluids -Supportive care.  3.  Chronic systolic CHF secondary to  dilated cardiomyopathy -Currently stable. -Patient with hemorrhagic shock on presentation responding to transfusion and hydration. -Continue to hold furosemide due to hypotension. -Amiodarone ordered however patient bout of emesis after amiodarone this morning. -Patient not on beta-blocker or ACE/ARB secondary to hypotension and chronic kidney disease.  4.  Coronary artery disease -Currently stable. -Continue amiodarone. -Hold furosemide.   5.  Endometrial cancer with peritoneal metastases/chemo induced peripheral neuropathy/chronic cancer related pain -Dr. Janese Banks of oncology consulted. -Continue amitriptyline, home regimen morphine and oxycodone. -Palliative care consultation for goals of care, symptom management.  6.  Chronic kidney disease stage IIIb -Stable.  7.  History of SVT -Continue home regimen amiodarone if patient able to tolerate orally.  8.  Leukocytosis -Likely secondary to #5.  DVT prophylaxis: TED hose/SCDs Code Status: DNR Family Communication: Updated patient and sister at bedside Disposition:   Status is: Inpatient    Dispo: The patient is from: Home              Anticipated d/c is to: To be determined              Patient currently with ongoing vaginal bleeding secondary to recurrent endometrial cancer, hypotensive, requiring packed red blood cell transfusion, oncology, radiation oncology, vascular surgery consulted.  Not stable for discharge.   Difficult to place patient no       Consultants:   Oncology: Dr. Janese Banks 09/24/2020  Radiation oncology: Dr. Donella Stade 09/22/2020  Vascular surgery: Marcelle Overlie, PA/Dr. Delana Meyer 09/14/2020  Palliative care: Altha Harm, NP 09/21/2020  Procedures:   Transfusion 1 unit packed red blood cells 10/09/2020    Antimicrobials:   None   Subjective: Patient laying in bed.  Some nausea and emesis with oral pills.  Complaining of lower abdominal pain which is worse than usual.  No chest pain.  No  shortness of breath. States did not recall refusing transfer to a tertiary care center as does not feel she recalls this was offered on admission. Ongoing vaginal bleeding this morning.  Objective: Vitals:   09/18/2020 1600 10/07/2020 1615 09/17/2020 1630 09/19/2020 1638  BP: (!) 104/38 (!) 111/46 (!) 108/41 (!) 114/41  Pulse: 79 79 78 78  Resp: 15 13 16 12   Temp:      TempSrc:      SpO2: 94% 96% 94% 96%  Weight:      Height:        Intake/Output Summary (Last 24 hours) at 10/02/2020 1644 Last data filed at 09/28/2020 0605 Gross per 24 hour  Intake 684.58 ml  Output --  Net 684.58 ml   Filed Weights   10/18/2020 1753 Oct 18, 2020 2100  Weight: 59.9 kg 61.2 kg    Examination:  General exam: Pallor Respiratory system: Clear to auscultation bilaterally.  No wheezes, no crackles, no rhonchi.  Normal respiratory effort. Cardiovascular system: S1 & S2 heard, RRR. No JVD, murmurs, rubs, gallops or clicks. No pedal edema. Gastrointestinal system: Abdomen is nondistended, soft and tender to palpation in the mid to lower abdominal region.  Positive bowel sounds.  No rebound.  No guarding.  Central nervous system: Alert and oriented. No focal neurological deficits. Extremities: Symmetric 5 x 5 power. Skin: No rashes, lesions or ulcers Psychiatry: Judgement and insight appear normal. Mood & affect appropriate.     Data Reviewed: I have personally reviewed following labs and imaging studies  CBC: Recent Labs  Lab 10/18/2020  1809 10/10/2020 0010 10/06/2020 0936 10/09/2020 1233  WBC 46.4*  --   --  56.2*  NEUTROABS 41.5*  --   --  50.8*  HGB 8.0* 6.3* 8.2* 8.5*  HCT 26.2*  --   --  25.7*  MCV 93.2  --   --  88.0  PLT 333  --   --  99991111    Basic Metabolic Panel: Recent Labs  Lab 09/20/2020 1809 10/04/2020 1233  NA 135 135  K 5.9* 5.8*  CL 105 106  CO2 17* 19*  GLUCOSE 165* 129*  BUN 27* 32*  CREATININE 1.92* 2.75*  CALCIUM 9.3 9.0  MG  --  2.0    GFR: Estimated Creatinine Clearance:  16.1 mL/min (A) (by C-G formula based on SCr of 2.75 mg/dL (H)).  Liver Function Tests: Recent Labs  Lab 10/02/2020 1809 10/09/2020 1233  AST 57* 52*  ALT 21 23  ALKPHOS 313* 302*  BILITOT 1.2 1.2  PROT 6.5 5.9*  ALBUMIN 3.0* 2.8*    CBG: No results for input(s): GLUCAP in the last 168 hours.   Recent Results (from the past 240 hour(s))  Resp Panel by RT-PCR (Flu A&B, Covid) Nasopharyngeal Swab     Status: None   Collection Time: 10/09/2020  8:03 PM   Specimen: Nasopharyngeal Swab; Nasopharyngeal(NP) swabs in vial transport medium  Result Value Ref Range Status   SARS Coronavirus 2 by RT PCR NEGATIVE NEGATIVE Final    Comment: (NOTE) SARS-CoV-2 target nucleic acids are NOT DETECTED.  The SARS-CoV-2 RNA is generally detectable in upper respiratory specimens during the acute phase of infection. The lowest concentration of SARS-CoV-2 viral copies this assay can detect is 138 copies/mL. A negative result does not preclude SARS-Cov-2 infection and should not be used as the sole basis for treatment or other patient management decisions. A negative result may occur with  improper specimen collection/handling, submission of specimen other than nasopharyngeal swab, presence of viral mutation(s) within the areas targeted by this assay, and inadequate number of viral copies(<138 copies/mL). A negative result must be combined with clinical observations, patient history, and epidemiological information. The expected result is Negative.  Fact Sheet for Patients:  EntrepreneurPulse.com.au  Fact Sheet for Healthcare Providers:  IncredibleEmployment.be  This test is no t yet approved or cleared by the [place] FDA and  has been authorized for detection and/or diagnosis of SARS-CoV-2 by FDA under an Emergency Use Authorization (EUA). This EUA will remain  in effect (meaning this test can be used) for the duration of the COVID-19 declaration under  Section 564(b)(1) of the Act, 21 U.S.C.section 360bbb-3(b)(1), unless the authorization is terminated  or revoked sooner.       Influenza A by PCR NEGATIVE NEGATIVE Final   Influenza B by PCR NEGATIVE NEGATIVE Final    Comment: (NOTE) The Xpert Xpress SARS-CoV-2/FLU/RSV plus assay is intended as an aid in the diagnosis of influenza from Nasopharyngeal swab specimens and should not be used as a sole basis for treatment. Nasal washings and aspirates are unacceptable for Xpert Xpress SARS-CoV-2/FLU/RSV testing.  Fact Sheet for Patients: EntrepreneurPulse.com.au  Fact Sheet for Healthcare Providers: IncredibleEmployment.be  This test is not yet approved or cleared by the [place] FDA and has been authorized for detection and/or diagnosis of SARS-CoV-2 by FDA under an Emergency Use Authorization (EUA). This EUA will remain in effect (meaning this test can be used) for the duration of the COVID-19 declaration under Section 564(b)(1) of the Act, 21 U.S.C. section 360bbb-3(b)(1), unless  the authorization is terminated or revoked.  Performed at [facility], [address], [place], [place] [address]          Radiology Studies: PERIPHERAL VASCULAR CATHETERIZATION  Result Date: 09/28/2020 See op note       Scheduled Meds: . [MAR Hold] sodium chloride   Intravenous Once  . [MAR Hold] amitriptyline  25 mg Oral QHS  . [MAR Hold] atorvastatin  10 mg Oral Daily  . [MAR Hold] Chlorhexidine Gluconate Cloth  6 each Topical Daily  . [START ON 10/08/2020] feeding supplement  237 mL Oral TID BM  . fentaNYL      . heparin sodium (porcine)      . [MAR Hold] levothyroxine  75 mcg Oral QAC breakfast  . midazolam      . [MAR Hold] pantoprazole (PROTONIX) IV  40 mg Intravenous Q24H  . [MAR Hold] polyethylene glycol  17 g Oral Daily  . [MAR Hold] senna  1 tablet Oral BID  . sodium chloride flush  3 mL Intravenous Q12H  . [MAR Hold]  sodium zirconium cyclosilicate  5 g Oral BID  . [MAR Hold] tranexamic acid  1,300 mg Oral TID   Continuous Infusions: . sodium chloride    . sodium chloride       LOS: 1 day    Time spent: 45 minutes    Irine Seal, MD [facility]   To contact the attending provider between 7A-7P or the covering provider during after hours 7P-7A, please log into the web site [email or web address] and access using universal [place] password for that web site. If you do not have the password, please call the hospital operator.  09/23/2020, 4:44 PM

## 2020-10-11 NOTE — Consult Note (Signed)
[facility] Vascular Consult Note  MRN : [id number]  Jaime Hall is a 70 y.o. ([birth year]) female who presents with chief complaint of  Chief Complaint  Patient presents with  . Vaginal Bleeding   History of Present Illness:  Jaime Hall is a 70 year old female with medical history significant for metastatic endometrial cancer s/p hysterectomy in 2019 and chemoradiation, with reoccurrence, now on palliative care, CKD 3b, HFrEF with dilated cardiomyopathy, chemotherapy-induced peripheral neuropathy, chronic anticoagulation with Eliquis for history of PE, hypothyroidism, anxiety and chronic pain, who presents to the emergency room with heavy and continuous vaginal bleeding x2 days, worse on standing, requiring hourly changes to sanitary pads. It was preceded by several week history of light intermittent bleeding. She denies pain beyond her baseline cancer related pain. Denies nausea vomiting or fever or change in bowel habits.  Denies lightheadedness, chest pain or palpitations or shortness of breath.   ED Course: On arrival, hypotensive at 83/42 with pulse 72 O2 sat 98% on room air.  Blood work significant for hemoglobin of 8, down from 11 a few weeks prior.  Creatinine at baseline at 1.92, mildly elevated potassium at 5.9.  Troponin of 14.  COVID and flu negative EKG as reviewed by me : Normal sinus at 78 with nonspecific ST-T wave changes  Patient was given an IV bolus in the emergency room with improvement in BP to 109/45 by admission.  The emergency room provider spoke with OB/GYN, Dr. Ouida Sills who recommended transfer for palliative radiation however per EDP, patient declined transfer opting to stay at this facility.  Hospitalist consulted for admission.  Vascular surgery was consulted by NP Borders for possible endovascular ablation.  Current Facility-Administered Medications  Medication Dose Route Frequency Provider Last Rate Last Admin  . 0.9 %   sodium chloride infusion (Manually program via Guardrails IV Fluids)   Intravenous Once Judd Gaudier V, MD      . 0.9 %  sodium chloride infusion   Intravenous Continuous Khaleef Ruby A, PA-C      . acetaminophen (TYLENOL) tablet 650 mg  650 mg Oral Q6H PRN Athena Masse, MD       Or  . acetaminophen (TYLENOL) suppository 650 mg  650 mg Rectal Q6H PRN Athena Masse, MD      . amitriptyline (ELAVIL) tablet 25 mg  25 mg Oral QHS Eugenie Filler, MD      . atorvastatin (LIPITOR) tablet 10 mg  10 mg Oral Daily Eugenie Filler, MD      . Chlorhexidine Gluconate Cloth 2 % PADS 6 each  6 each Topical Daily Eugenie Filler, MD      . ketorolac (TORADOL) 30 MG/ML injection 7.5 mg  7.5 mg Intravenous Q6H PRN Eugenie Filler, MD      . Derrill Memo ON 10/08/2020] levothyroxine (SYNTHROID) tablet 75 mcg  75 mcg Oral QAC breakfast Eugenie Filler, MD      . LORazepam (ATIVAN) tablet 0.5 mg  0.5 mg Oral Q6H PRN Eugenie Filler, MD      . morphine 2 MG/ML injection 2-4 mg  2-4 mg Intravenous Q2H PRN Borders, Kirt Boys, NP      . morphine CONCENTRATE 10 mg / 0.5 ml oral solution 5 mg  5 mg Oral Q6H PRN Eugenie Filler, MD      . ondansetron [facility]) tablet 4 mg  4 mg Oral Q6H PRN Athena Masse, MD  Or  . ondansetron (ZOFRAN) injection 4 mg  4 mg Intravenous Q6H PRN Athena Masse, MD   4 mg at 10/08/2020 0943  . oxyCODONE (Oxy IR/ROXICODONE) immediate release tablet 5 mg  5 mg Oral Q4H PRN Eugenie Filler, MD   5 mg at 10/08/2020 0934  . pantoprazole (PROTONIX) injection 40 mg  40 mg Intravenous Q24H Eugenie Filler, MD      . tranexamic acid (LYSTEDA) tablet 1,300 mg  1,300 mg Oral TID Eugenie Filler, MD       Facility-Administered Medications Ordered in Other Encounters  Medication Dose Route Frequency Provider Last Rate Last Admin  . heparin lock flush 100 unit/mL  500 Units Intravenous Once Sindy Guadeloupe, MD      . sodium chloride flush (NS) 0.9 % injection 10 mL   10 mL Intravenous PRN Sindy Guadeloupe, MD   10 mL at 12/07/19 0914  . sodium chloride flush (NS) 0.9 % injection 10 mL  10 mL Intravenous PRN Sindy Guadeloupe, MD   10 mL at 01/04/20 0850   Past Medical History:  Diagnosis Date  . Anxiety   . Cervicalgia   . Chronic kidney disease   . Coronary artery disease    a. 02/2012 Stress echo: severe anterior wall ischemia;  b. 02/2012 Cath/PCI: LAD 95p (3.0 x 15 Xience EX DES), D1 90ost (PTCA - bifurcational dzs), EF 45% with anterior HK;  c. 02/2013 Ex MV: fixed anterior defect w/ minor reversibility, nl EF-->Med Rx; d. 11/2019 MV: EF 41%, ant/antlat ischemia-->high risk scan; e. 11/2019 Cath: LM nl, LAD 40ost/p, patent LAD stent, LCX nl, RCA nl, RPDA/RPAV nl, EF 30  . Endometrial cancer ([place])    a. 07/2016 s/p robotic hysterectomy, BSO w/ washings, sentinel node inj, mapping, bx, adhesiolysis.  . Essential hypertension, benign   . Fibrocystic breast disease   . GERD (gastroesophageal reflux disease)   . Gestational hypertension   . Heart murmur   . HFrEF (heart failure with reduced ejection fraction) ([place])    a. 05/2020 Echo: EF <20%, glob HK.  Marland Kitchen History of anemia   . History of blood transfusion   . Hodgkin's lymphoma ([place]) 2011   a. s/p radiation and chemo therapy  . Mixed Ischemic and Nonischemic Cardiomyopathy    a. 05/2020 Echo: EF <20%, glob HK, Nl RV size/fxn, mildly dil LA. Mild MR. Mild to mod Ao sclerosis.  . Osteoarthritis   . Polycystic ovarian disease    Past Surgical History:  Procedure Laterality Date  . ABDOMINAL HYSTERECTOMY    . bladder sling    . CARDIAC CATHETERIZATION  02/2012   [facility] 1 stent place  . CERVICAL POLYPECTOMY    . CHOLECYSTECTOMY  1982  . COLONOSCOPY WITH PROPOFOL N/A 02/05/2015   Procedure: COLONOSCOPY WITH PROPOFOL;  Surgeon: Lucilla Lame, MD;  Location: [facility];  Service: Endoscopy;  Laterality: N/A;  . CORONARY ANGIOPLASTY  02/2012   left/right s/p balloon  . CYSTOGRAM N/A 08/17/2016   Procedure:  CYSTOGRAM;  Surgeon: Hollice Espy, MD;  Location: [facility];  Service: Urology;  Laterality: N/A;  . CYSTOSCOPY N/A 08/17/2016   Procedure: CYSTOSCOPY EXAM UNDER ANESTHESIA;  Surgeon: Hollice Espy, MD;  Location: [facility];  Service: Urology;  Laterality: N/A;  . CYSTOSCOPY W/ RETROGRADES Bilateral 08/17/2016   Procedure: CYSTOSCOPY WITH RETROGRADE PYELOGRAM;  Surgeon: Hollice Espy, MD;  Location: [facility];  Service: Urology;  Laterality: Bilateral;  . CYSTOSCOPY WITH STENT PLACEMENT Right 08/17/2016  Procedure: CYSTOSCOPY WITH STENT PLACEMENT;  Surgeon: Hollice Espy, MD;  Location: [facility];  Service: Urology;  Laterality: Right;  . heart stent'  2013  . kidney stent Right 2018  . LYMPH NODE BIOPSY  2011   diagnosis of hodgkins lymphoma  . PELVIC LYMPH NODE DISSECTION N/A 07/29/2016   Procedure: PELVIC/AORTIC LYMPH NODE SAMPLING;  Surgeon: [name], MD;  Location: [facility];  Service: Gynecology;  Laterality: N/A;  . PORTA CATH INSERTION N/A 09/22/2016   Procedure: Glori Luis Cath Insertion;  Surgeon: [name], MD;  Location: [place] [facility];  Service: Cardiovascular;  Laterality: N/A;  . PORTA CATH INSERTION N/A 10/27/2019   Procedure: PORTA CATH INSERTION;  Surgeon: [name], MD;  Location: [place] [facility];  Service: Cardiovascular;  Laterality: N/A;  . PORTA CATH REMOVAL N/A 11/17/2016   Procedure: Glori Luis Cath Removal;  Surgeon: [name], MD;  Location: [place] [facility];  Service: Cardiovascular;  Laterality: N/A;  . RIGHT/LEFT HEART CATH AND CORONARY ANGIOGRAPHY N/A 12/04/2019   Procedure: RIGHT/LEFT HEART CATH AND CORONARY ANGIOGRAPHY;  Surgeon: Wellington Hampshire, MD;  Location: [place] [facility];  Service: Cardiovascular;  Laterality: N/A;  . ROBOTIC ASSISTED TOTAL HYSTERECTOMY WITH BILATERAL SALPINGO OOPHERECTOMY N/A 07/29/2016   Procedure: ROBOTIC ASSISTED TOTAL HYSTERECTOMY WITH BILATERAL SALPINGO OOPHORECTOMY;  Surgeon: [name], MD;  Location: [facility];  Service: Gynecology;  Laterality: N/A;  . SENTINEL NODE BIOPSY N/A 07/29/2016   Procedure: SENTINEL NODE BIOPSY;  Surgeon: [name], MD;  Location: [facility];  Service: Gynecology;  Laterality: N/A;  . transobturator sling N/A 2009   [place] History Social History   Tobacco Use  . Smoking status: Former Smoker    Packs/day: 1.00    Years: 30.00    Pack years: 30.00    Types: Cigarettes    Quit date: 07/24/2002    Years since quitting: 18.2  . Smokeless tobacco: Never Used  . Tobacco comment: quit smoking in 2000  Vaping Use  . Vaping Use: Never used  Substance Use Topics  . Alcohol use: Not Currently  . Drug use: No   Family History Family History  Problem Relation Age of Onset  . ALS Father   . Polymyositis Father   . Diabetes Brother   . Dementia Mother   . Cancer Maternal Aunt        breast  . Breast cancer Maternal Aunt        30's  . Stroke Maternal Grandmother   . Cancer Maternal Grandfather        prostate  . Stroke Maternal Grandfather   . Colon cancer Maternal Aunt   . Non-Hodgkin's lymphoma Cousin   Denies family history of peripheral artery disease, venous disease or renal disease.  Allergies  Allergen Reactions  . Metoprolol Other (See Comments)    Fatigue  . Adhesive [Tape] Other (See Comments)    Skin Irritation   . Antifungal [Miconazole Nitrate] Rash  . Sulfa Antibiotics Nausea And Vomiting and Rash  . Z-Pak [Azithromycin] Itching   REVIEW OF SYSTEMS (Negative unless checked)  Constitutional: [] Weight loss  [] Fever  [] Chills Cardiac: [] Chest pain   [] Chest pressure   [] Palpitations   [] Shortness of breath when laying flat   [] Shortness of breath at rest   [x] Shortness of breath with exertion. Vascular:  [] Pain in legs with walking   [] Pain in legs at rest   [] Pain in legs when laying flat   []   Claudication   [] Pain in feet when walking  [] Pain in feet at rest  [] Pain in feet when laying  flat   [] History of DVT   [] Phlebitis   [] Swelling in legs   [] Varicose veins   [] Non-healing ulcers Pulmonary:   [] Uses home oxygen   [] Productive cough   [] Hemoptysis   [] Wheeze  [] COPD   [] Asthma Neurologic:  [] Dizziness  [] Blackouts   [] Seizures   [] History of stroke   [] History of TIA  [] Aphasia   [] Temporary blindness   [] Dysphagia   [] Weakness or numbness in arms   [] Weakness or numbness in legs Musculoskeletal:  [] Arthritis   [] Joint swelling   [] Joint pain   [] Low back pain Hematologic:  [] Easy bruising  [x] Easy bleeding   [] Hypercoagulable state   [x] Anemic  [] Hepatitis Gastrointestinal:  [] Blood in stool   [] Vomiting blood  [] Gastroesophageal reflux/heartburn   [] Difficulty swallowing. Genitourinary:  [] Chronic kidney disease   [] Difficult urination  [] Frequent urination  [] Burning with urination   [] Blood in urine Skin:  [] Rashes   [] Ulcers   [] Wounds Psychological:  [x] History of anxiety   []  History of major depression.  Physical Examination  Vitals:   09/26/2020 0342 10/05/2020 0359 09/24/2020 0609 10/10/2020 0717  BP: (!) 100/37 (!) 110/45 (!) 109/42 (!) 122/44  Pulse: 70 71 72 80  Resp: 20 20 19 18   Temp: 97.9 F (36.6 C) 98 F (36.7 C) 97.9 F (36.6 C) 98 F (36.7 C)  TempSrc: Oral Oral Oral   SpO2: 95% 94% 97% 97%  Weight:      Height:       Body mass index is 24.87 kg/m. Gen:  WD/WN, NAD Head: [place]/AT, No temporalis wasting. Prominent temp pulse not noted. Ear/Nose/Throat: Hearing grossly intact, nares w/o erythema or drainage, oropharynx w/o Erythema/Exudate Eyes: Sclera non-icteric, conjunctiva clear Neck: Trachea midline.  No JVD.  Pulmonary:  Good air movement, respirations not labored, equal bilaterally.  Cardiac: RRR, normal S1, S2. Vascular:  Vessel Right Left  Radial Palpable Palpable  Ulnar Palpable Palpable                               Gastrointestinal: soft, non-tender/non-distended. No guarding/reflex.  Musculoskeletal: M/S 5/5 throughout.   Extremities without ischemic changes.  No deformity or atrophy. No edema. Neurologic: Sensation grossly intact in extremities.  Symmetrical.  Speech is fluent. Motor exam as listed above. Psychiatric: Judgment intact, Mood & affect appropriate for pt's clinical situation. Dermatologic: No rashes or ulcers noted.  No cellulitis or open wounds. Lymph : No Cervical, Axillary, or Inguinal lymphadenopathy.  CBC Lab Results  Component Value Date   WBC 46.4 (H) 09/16/2020   HGB 8.2 (L) 10/03/2020   HCT 26.2 (L) 10/11/2020   MCV 93.2 09/16/2020   PLT 333 09/16/2020   BMET    Component Value Date/Time   NA 135 10/03/2020 1809   NA 140 02/15/2019 0000   NA 142 03/27/2014 1214   K 5.9 (H) 09/22/2020 1809   K 4.6 03/27/2014 1214   CL 105 09/26/2020 1809   CL 107 03/27/2014 1214   CO2 17 (L) 10/07/2020 1809   CO2 33 (H) 03/27/2014 1214   GLUCOSE 165 (H) 10/07/2020 1809   GLUCOSE 77 03/27/2014 1214   BUN 27 (H) 10/01/2020 1809   BUN 19 02/15/2019 0000   BUN 14 03/27/2014 1214   CREATININE 1.92 (H) 10/01/2020 1809   CREATININE 1.04 03/27/2014 1214   CALCIUM  9.3 09/29/2020 1809   CALCIUM 8.7 03/27/2014 1214   GFRNONAA 28 (L) 09/22/2020 1809   GFRNONAA 57 (L) 03/27/2014 1214   GFRNONAA 43 (L) 07/21/2013 1429   GFRAA 33 (L) 03/12/2020 0851   GFRAA >60 03/27/2014 1214   GFRAA 50 (L) 07/21/2013 1429   Estimated Creatinine Clearance: 23 mL/min (A) (by C-G formula based on SCr of 1.92 mg/dL (H)).  COAG Lab Results  Component Value Date   INR 1.5 (H) 09/20/2020   INR 1.7 (H) 08/13/2020   INR 1.4 (H) 07/19/2020   Radiology No results found.  Assessment/Plan The patient is a 70 year old female with multiple medical issues and known history of metastatic uterine cancer who was experiencing increased/worsening vaginal bleeding  1.  Metastatic Uterine Cancer: Patient with known history of metastatic uterine cancer who vascular surgery was requested to consult on due to worsening  vaginal bleeding.  Patient has inoperable cancer and therefore this would be for palliative purposes.  Agree with the patient's oncologist  / palliative care due to the anemia, pain and discomfort wth increased vaginal bleeding the patient would benefit from undergoing a uterine embolization.  Procedure, risks and benefits were explained to the patient and her family member who was at the bedside.  All questions were answered.  Patient was to proceed.  We also discussed that she may experience discomfort from the embolization as well which can last a few days to a week or so.  He expresses her understanding and is still willing to proceed.  2.  Acute blood loss anemia: Hopefully embolization of the patient's uterus will help achieve hemostasis. Continue serial H&H  3.  Chronic anticoagulation due to pulmonary embolism: Due to the patient's acute blood loss anemia this has been stopped. Would defer restarting anticoagulation to the patient's oncologist  Discussed with Dr. Francene Castle, PA-C  09/28/2020 12:46 PM  This note was created with Dragon medical transcription system.  Any error is purely unintentional.

## 2020-10-11 NOTE — Progress Notes (Signed)
Initial Nutrition Assessment  DOCUMENTATION CODES:   Not applicable  INTERVENTION:   Ensure Enlive po TID, each supplement provides 350 kcal and 20 grams of protein  NUTRITION DIAGNOSIS:   Increased nutrient needs related to cancer and cancer related treatments as evidenced by estimated needs.  GOAL:   Patient will meet greater than or equal to 90% of their needs  MONITOR:   PO intake,Supplement acceptance,Labs,Weight trends,Skin,I & O's  REASON FOR ASSESSMENT:   Malnutrition Screening Tool    ASSESSMENT:   70 y.o. female with medical history significant for metastatic endometrial cancer s/p hysterectomy in 2019 and chemo/radiation, with reoccurrence, now on palliative care, CKD 3b, HFrEF with dilated cardiomyopathy, chemotherapy-induced peripheral neuropathy, chronic anticoagulation with Eliquis for history of PE, hypothyroidism, anxiety and chronic pain and lymphoma s/p chemo/radition who presents to the emergency room with heavy and continuous vaginal bleeding.   Unable to see patient today as pt in procedure at time of RD visit. Pt NPO today for vascular embolization. RD will add supplements to help pt meet her estimated needs. Per chart, pt down 6lbs(4%) in two months; this is not significant. Pt is followed by palliative care. RD will obtain nutrition related history and exam at follow-up.   Medications reviewed and include: heparin, fentanyl, synthroid, protonix, miralax, senokot, lokelma, NaCl @125ml /hr, cefazolin   Labs reviewed: K 5.8(H), BUN 32(H), creat 2.75(H) Wbc- 56.2(H), Hgb 8.5(L), Hct 25.7(L)  NUTRITION - FOCUSED PHYSICAL EXAM: Unable to perform at this time   Diet Order:   Diet Order            Diet NPO time specified  Diet effective now                EDUCATION NEEDS:   No education needs have been identified at this time  Skin:  Skin Assessment: Reviewed RN Assessment  Last BM:  4/27  Height:   Ht Readings from Last 1 Encounters:   09/14/2020 5' 1.75" (1.568 m)    Weight:   Wt Readings from Last 1 Encounters:  09/17/2020 61.2 kg    Ideal Body Weight:  50 kg  BMI:  Body mass index is 24.87 kg/m.  Estimated Nutritional Needs:   Kcal:  1600-1800kcal/day  Protein:  80-90g/day  Fluid:  1.5L/day  Koleen Distance MS, RD, LDN Please refer to [facility] for RD and/or RD on-call/weekend/after hours pager

## 2020-10-11 NOTE — Telephone Encounter (Signed)
Pt got message from [facility] that atb was sent in

## 2020-10-11 NOTE — Consult Note (Signed)
[place]  [phone] [phone]   Name: Jaime Hall Date: 10/06/2020 MRN: [id number]  DOB: 08/13/1950  Patient Care Team: [name], MD as PCP - General (Internal Medicine) Wellington Hampshire, MD as PCP - Cardiology (Cardiology) [name], MD as Consulting Physician (General Surgery) Mellody Drown, MD as Referring Physician (Obstetrics and Gynecology) [name], MD as Referring Physician (Obstetrics and Gynecology) Hollice Espy, MD as Consulting Physician (Urology) [name], RN as Registered Nurse Sindy Guadeloupe, MD as Consulting Physician (Oncology) Leona Singleton, RN as Case Manager    REASON FOR CONSULTATION: Jaime Hall is a 70 y.o. 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 Patient was originally diagnosed in 2018 with stage I endometrial cancer and underwent TAH/BSO and adjuvant chemotherapy but unfortunately was found to have disease recurrence in May 2021. Patient has had recurrent hospitalizations for tachycardia and CHF.  She is most recently hospitalized in February 2022 for UTI.  Patient was referred to hospice but was ultimately not admitted to their services due to high cost medications. Patient is now admitted 10/07/2020 with vaginal bleeding.  Palliative care was consulted to help address goals..   SOCIAL HISTORY:     reports that she quit smoking about 18 years ago. Her smoking use included cigarettes. She has a 30.00 pack-year smoking history. She has never used smokeless tobacco. She reports previous alcohol use. She reports that she does not use drugs.  Patient is widowed x3 years. She lives at home alone with her cat. Her sister has been staying with her.  She has twosons who live nearby. 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 Until recently, she was still working four hours per day when able.  ADVANCE DIRECTIVES:  Not on file  CODE STATUS: DNR  PAST MEDICAL HISTORY: Past Medical History:  Diagnosis Date  . Anxiety   . Cervicalgia   . Chronic kidney disease   . Coronary artery disease    a. 02/2012 Stress echo: severe anterior wall ischemia;  b. 02/2012 Cath/PCI: LAD 95p (3.0 x 15 Xience EX DES), D1 90ost (PTCA - bifurcational dzs), EF 45% with anterior HK;  c. 02/2013 Ex MV: fixed anterior defect w/ minor reversibility, nl EF-->Med Rx; d. 11/2019 MV: EF 41%, ant/antlat ischemia-->high risk scan; e. 11/2019 Cath: LM nl, LAD 40ost/p, patent LAD stent, LCX nl, RCA nl, RPDA/RPAV nl, EF 30  . Endometrial cancer ([place])    a. 07/2016 s/p robotic hysterectomy, BSO w/ washings, sentinel node inj, mapping, bx, adhesiolysis.  . Essential hypertension, benign   . Fibrocystic breast disease   . GERD (gastroesophageal reflux disease)   . Gestational hypertension   . Heart murmur   . HFrEF (heart failure with reduced ejection fraction) ([place])    a. 05/2020 Echo: EF <20%, glob HK.  Marland Kitchen History of anemia   . History of blood transfusion   . Hodgkin's lymphoma ([place]) 2011   a. s/p radiation and chemo therapy  . Mixed Ischemic and Nonischemic Cardiomyopathy    a. 05/2020 Echo: EF <20%, glob HK, Nl RV size/fxn, mildly dil LA. Mild MR. Mild to mod Ao sclerosis.  . Osteoarthritis   . Polycystic ovarian disease     PAST SURGICAL HISTORY:  Past Surgical History:  Procedure Laterality Date  . ABDOMINAL HYSTERECTOMY    .  bladder sling    . CARDIAC CATHETERIZATION  02/2012   [facility] 1 stent place  . CERVICAL POLYPECTOMY    . CHOLECYSTECTOMY  1982  . COLONOSCOPY WITH PROPOFOL N/A 02/05/2015   Procedure: COLONOSCOPY WITH PROPOFOL;  Surgeon: Lucilla Lame, MD;  Location: [facility] ENDOSCOPY;  Service: Endoscopy;  Laterality: N/A;  . CORONARY ANGIOPLASTY  02/2012   left/right s/p  balloon  . CYSTOGRAM N/A 08/17/2016   Procedure: CYSTOGRAM;  Surgeon: Hollice Espy, MD;  Location: [facility] ORS;  Service: Urology;  Laterality: N/A;  . CYSTOSCOPY N/A 08/17/2016   Procedure: CYSTOSCOPY EXAM UNDER ANESTHESIA;  Surgeon: Hollice Espy, MD;  Location: [facility] ORS;  Service: Urology;  Laterality: N/A;  . CYSTOSCOPY W/ RETROGRADES Bilateral 08/17/2016   Procedure: CYSTOSCOPY WITH RETROGRADE PYELOGRAM;  Surgeon: Hollice Espy, MD;  Location: [facility] ORS;  Service: Urology;  Laterality: Bilateral;  . CYSTOSCOPY WITH STENT PLACEMENT Right 08/17/2016   Procedure: CYSTOSCOPY WITH STENT PLACEMENT;  Surgeon: Hollice Espy, MD;  Location: [facility] ORS;  Service: Urology;  Laterality: Right;  . heart stent'  2013  . kidney stent Right 2018  . LYMPH NODE BIOPSY  2011   diagnosis of hodgkins lymphoma  . PELVIC LYMPH NODE DISSECTION N/A 07/29/2016   Procedure: PELVIC/AORTIC LYMPH NODE SAMPLING;  Surgeon: [name], MD;  Location: [facility] ORS;  Service: Gynecology;  Laterality: N/A;  . PORTA CATH INSERTION N/A 09/22/2016   Procedure: Glori Luis Cath Insertion;  Surgeon: [name], MD;  Location: [place] [facility];  Service: Cardiovascular;  Laterality: N/A;  . PORTA CATH INSERTION N/A 10/27/2019   Procedure: PORTA CATH INSERTION;  Surgeon: [name], MD;  Location: [place] [facility];  Service: Cardiovascular;  Laterality: N/A;  . PORTA CATH REMOVAL N/A 11/17/2016   Procedure: Glori Luis Cath Removal;  Surgeon: [name], MD;  Location: [place] [facility];  Service: Cardiovascular;  Laterality: N/A;  . RIGHT/LEFT HEART CATH AND CORONARY ANGIOGRAPHY N/A 12/04/2019   Procedure: RIGHT/LEFT HEART CATH AND CORONARY ANGIOGRAPHY;  Surgeon: Wellington Hampshire, MD;  Location: [place] [facility];  Service: Cardiovascular;  Laterality: N/A;  . ROBOTIC ASSISTED TOTAL HYSTERECTOMY WITH BILATERAL SALPINGO OOPHERECTOMY N/A 07/29/2016   Procedure: ROBOTIC ASSISTED TOTAL HYSTERECTOMY WITH BILATERAL  SALPINGO OOPHORECTOMY;  Surgeon: [name], MD;  Location: [facility];  Service: Gynecology;  Laterality: N/A;  . SENTINEL NODE BIOPSY N/A 07/29/2016   Procedure: SENTINEL NODE BIOPSY;  Surgeon: [name], MD;  Location: [facility];  Service: Gynecology;  Laterality: N/A;  . transobturator sling N/A 2009   [place]    HEMATOLOGY/ONCOLOGY HISTORY:  Oncology History  Endometrial cancer ([place])  08/13/2016 Initial Diagnosis   Endometrial cancer ([place])   11/06/2019 - 12/28/2019 Chemotherapy   The patient had palonosetron (ALOXI) injection 0.25 mg, 0.25 mg, Intravenous,  Once, 3 of 4 cycles Administration: 0.25 mg (11/14/2019), 0.25 mg (12/07/2019), 0.25 mg (12/14/2019), 0.25 mg (12/28/2019) pegfilgrastim (NEULASTA ONPRO KIT) injection 6 mg, 6 mg, Subcutaneous, Once, 3 of 4 cycles Administration: 6 mg (11/14/2019), 6 mg (12/14/2019) CARBOplatin (PARAPLATIN) 140 mg in sodium chloride 0.9 % 100 mL chemo infusion, 140 mg (100 % of original dose 141 mg), Intravenous,  Once, 3 of 4 cycles Dose modification:   (original dose 141 mg, Cycle 1) Administration: 140 mg (11/06/2019), 110 mg (12/07/2019), 150 mg (11/14/2019), 110 mg (12/14/2019), 110 mg (12/28/2019) gemcitabine (GEMZAR) 1,400 mg in sodium chloride 0.9 % 250 mL chemo infusion, 1,444 mg (100 % of original dose 800 mg/m2), Intravenous,  Once, 3 of 4 cycles Dose modification: 800 mg/m2 (original dose 800 mg/m2, Cycle 1, Reason: Patient Age) Administration: 1,400 mg (11/06/2019), 1,400 mg (11/14/2019), 1,400 mg (12/07/2019), 1,400 mg (12/14/2019), 1,400 mg (12/28/2019)  for chemotherapy treatment.    01/11/2020 -  Chemotherapy   The patient had lenvatinib 14 mg daily dose (LENVIMA, 14 MG DAILY DOSE,) 10 & 4 MG capsule, 14 mg, Oral, Daily, 1 of 1 cycle, Start date: --, End date: -- pembrolizumab (KEYTRUDA) 200 mg in sodium chloride 0.9 % 50 mL chemo infusion, 200 mg, Intravenous, Once, 5 of 6 cycles Administration: 200 mg (01/11/2020), 200 mg  (04/05/2020), 200 mg (04/26/2020), 200 mg (02/01/2020), 200 mg (05/17/2020)  for chemotherapy treatment.    Peritoneal metastases ([place])  10/16/2019 Initial Diagnosis   Peritoneal metastases ([place])   11/06/2019 - 12/28/2019 Chemotherapy   The patient had palonosetron (ALOXI) injection 0.25 mg, 0.25 mg, Intravenous,  Once, 3 of 4 cycles Administration: 0.25 mg (11/14/2019), 0.25 mg (12/07/2019), 0.25 mg (12/14/2019), 0.25 mg (12/28/2019) pegfilgrastim (NEULASTA ONPRO KIT) injection 6 mg, 6 mg, Subcutaneous, Once, 3 of 4 cycles Administration: 6 mg (11/14/2019), 6 mg (12/14/2019) CARBOplatin (PARAPLATIN) 140 mg in sodium chloride 0.9 % 100 mL chemo infusion, 140 mg (100 % of original dose 141 mg), Intravenous,  Once, 3 of 4 cycles Dose modification:   (original dose 141 mg, Cycle 1) Administration: 140 mg (11/06/2019), 110 mg (12/07/2019), 150 mg (11/14/2019), 110 mg (12/14/2019), 110 mg (12/28/2019) gemcitabine (GEMZAR) 1,400 mg in sodium chloride 0.9 % 250 mL chemo infusion, 1,444 mg (100 % of original dose 800 mg/m2), Intravenous,  Once, 3 of 4 cycles Dose modification: 800 mg/m2 (original dose 800 mg/m2, Cycle 1, Reason: Patient Age) Administration: 1,400 mg (11/06/2019), 1,400 mg (11/14/2019), 1,400 mg (12/07/2019), 1,400 mg (12/14/2019), 1,400 mg (12/28/2019)  for chemotherapy treatment.    01/11/2020 -  Chemotherapy   The patient had lenvatinib 14 mg daily dose (LENVIMA, 14 MG DAILY DOSE,) 10 & 4 MG capsule, 14 mg, Oral, Daily, 1 of 1 cycle, Start date: --, End date: -- pembrolizumab (KEYTRUDA) 200 mg in sodium chloride 0.9 % 50 mL chemo infusion, 200 mg, Intravenous, Once, 5 of 6 cycles Administration: 200 mg (01/11/2020), 200 mg (04/05/2020), 200 mg (04/26/2020), 200 mg (02/01/2020), 200 mg (05/17/2020)  for chemotherapy treatment.      ALLERGIES:  is allergic to metoprolol, adhesive [tape], antifungal [miconazole nitrate], sulfa antibiotics, and z-pak [azithromycin].  MEDICATIONS:  Current Facility-Administered  Medications  Medication Dose Route Frequency Provider Last Rate Last Admin  . [MAR Hold] 0.9 %  sodium chloride infusion (Manually program via Guardrails IV Fluids)   Intravenous Once Judd Gaudier V, MD      . 0.9 %  sodium chloride infusion   Intravenous Continuous Stegmayer, Janalyn Harder, PA-C 125 mL/hr at 09/27/2020 1312 New Bag at 10/05/2020 1312  . 0.9 %  sodium chloride infusion   Intravenous Continuous Stegmayer, Kimberly A, PA-C      . [MAR Hold] acetaminophen (TYLENOL) tablet 650 mg  650 mg Oral Q6H PRN Athena Masse, MD       Or  . Doug Sou Hold] acetaminophen (TYLENOL) suppository 650 mg  650 mg Rectal Q6H PRN Athena Masse, MD      . Doug Sou Hold] amitriptyline (ELAVIL) tablet 25 mg  25 mg Oral QHS Eugenie Filler, MD      . Doug Sou Hold] atorvastatin (LIPITOR) tablet 10 mg  10 mg Oral Daily Eugenie Filler, MD      . [  START ON 11-08-2020] ceFAZolin (ANCEF) 1 g in sodium chloride 0.9 % 100 mL IVPB  1 g Intravenous Once Stegmayer, Kimberly A, PA-C      . [MAR Hold] Chlorhexidine Gluconate Cloth 2 % PADS 6 each  6 each Topical Daily Eugenie Filler, MD   6 each at 09/23/2020 1314  . diphenhydrAMINE (BENADRYL) injection 50 mg  50 mg Intravenous Once PRN Stegmayer, Kimberly A, PA-C      . famotidine (PEPCID) tablet 40 mg  40 mg Oral Once PRN Stegmayer, Kimberly A, PA-C      . fentaNYL (SUBLIMAZE) 100 MCG/2ML injection           . [MAR Hold] levothyroxine (SYNTHROID) tablet 75 mcg  75 mcg Oral QAC breakfast Eugenie Filler, MD      . Doug Sou Hold] LORazepam (ATIVAN) tablet 0.5 mg  0.5 mg Oral Q6H PRN Eugenie Filler, MD      . methylPREDNISolone sodium succinate (SOLU-MEDROL) 125 mg/2 mL injection 125 mg  125 mg Intravenous Once PRN Stegmayer, Kimberly A, PA-C      . midazolam (VERSED) 2 MG/ML syrup 8 mg  8 mg Oral Once PRN Stegmayer, Kimberly A, PA-C      . midazolam (VERSED) 5 MG/5ML injection           . [MAR Hold] morphine 4 MG/ML injection 2-4 mg  2-4 mg Intravenous Q2H PRN Wania Longstreth,  Kirt Boys, NP      . [MAR Hold] morphine CONCENTRATE 10 MG/0.5ML oral solution 5 mg  5 mg Oral Q6H PRN Eugenie Filler, MD      . Doug Sou Hold] ondansetron [facility]) tablet 4 mg  4 mg Oral Q6H PRN Athena Masse, MD       Or  . Doug Sou Hold] ondansetron [facility]) injection 4 mg  4 mg Intravenous Q6H PRN Athena Masse, MD   4 mg at 10/06/2020 0943  . [MAR Hold] ondansetron (ZOFRAN) injection 4 mg  4 mg Intravenous Q6H PRN Stegmayer, Janalyn Harder, PA-C      . [MAR Hold] oxyCODONE (Oxy IR/ROXICODONE) immediate release tablet 5 mg  5 mg Oral Q4H PRN Eugenie Filler, MD   5 mg at 09/30/2020 0934  . [MAR Hold] pantoprazole (PROTONIX) injection 40 mg  40 mg Intravenous Q24H Eugenie Filler, MD   40 mg at 10/03/2020 1306  . [MAR Hold] polyethylene glycol (MIRALAX / GLYCOLAX) packet 17 g  17 g Oral Daily Lorna Dibble, RPH      . [MAR Hold] senna (SENOKOT) tablet 8.6 mg  1 tablet Oral BID Lorna Dibble, RPH      . [MAR Hold] sodium zirconium cyclosilicate (LOKELMA) packet 5 g  5 g Oral BID Eugenie Filler, MD      . Doug Sou Hold] tranexamic acid (LYSTEDA) tablet 1,300 mg  1,300 mg Oral TID Eugenie Filler, MD       Facility-Administered Medications Ordered in Other Encounters  Medication Dose Route Frequency Provider Last Rate Last Admin  . heparin lock flush 100 unit/mL  500 Units Intravenous Once Sindy Guadeloupe, MD      . sodium chloride flush (NS) 0.9 % injection 10 mL  10 mL Intravenous PRN Sindy Guadeloupe, MD   10 mL at 12/07/19 0914  . sodium chloride flush (NS) 0.9 % injection 10 mL  10 mL Intravenous PRN Sindy Guadeloupe, MD   10 mL at 01/04/20 0850    VITAL SIGNS: BP (!) 124/46 (BP  Location: Left Arm)   Pulse 82   Temp 97.8 F (36.6 C) (Oral)   Resp 18   Ht 5' 1.75" (1.568 m)   Wt 134 lb 14.4 oz (61.2 kg)   SpO2 100%   BMI 24.87 kg/m  Filed Weights   09/25/2020 1753 09/14/2020 2100  Weight: 132 lb (59.9 kg) 134 lb 14.4 oz (61.2 kg)    Estimated body mass index is 24.87 kg/m as  calculated from the following:   Height as of this encounter: 5' 1.75" (1.568 m).   Weight as of this encounter: 134 lb 14.4 oz (61.2 kg).  LABS: CBC:    Component Value Date/Time   WBC 56.2 (HH) 09/25/2020 1233   HGB 8.5 (L) 09/24/2020 1233   HGB 13.4 03/27/2014 1214   HCT 25.7 (L) 10/05/2020 1233   HCT 41.8 03/27/2014 1214   PLT 319 09/13/2020 1233   PLT 220 03/27/2014 1214   MCV 88.0 09/30/2020 1233   MCV 92 03/27/2014 1214   NEUTROABS 50.8 (H) 09/21/2020 1233   NEUTROABS 3,274 02/15/2019 0000   NEUTROABS 4.9 03/27/2014 1214   LYMPHSABS 1.1 10/03/2020 1233   LYMPHSABS 1.4 03/27/2014 1214   MONOABS 1.7 (H) 10/06/2020 1233   MONOABS 0.6 03/27/2014 1214   EOSABS 0.0 09/30/2020 1233   EOSABS 0.1 03/27/2014 1214   BASOSABS 0.3 (H) 10/05/2020 1233   BASOSABS 0.1 03/27/2014 1214   Comprehensive Metabolic Panel:    Component Value Date/Time   NA 135 10/01/2020 1233   NA 140 02/15/2019 0000   NA 142 03/27/2014 1214   K 5.8 (H) 09/28/2020 1233   K 4.6 03/27/2014 1214   CL 106 10/09/2020 1233   CL 107 03/27/2014 1214   CO2 19 (L) 09/13/2020 1233   CO2 33 (H) 03/27/2014 1214   BUN 32 (H) 09/14/2020 1233   BUN 19 02/15/2019 0000   BUN 14 03/27/2014 1214   CREATININE 2.75 (H) 09/13/2020 1233   CREATININE 1.04 03/27/2014 1214   GLUCOSE 129 (H) 09/24/2020 1233   GLUCOSE 77 03/27/2014 1214   CALCIUM 9.0 09/19/2020 1233   CALCIUM 8.7 03/27/2014 1214   AST 52 (H) 09/18/2020 1233   AST 17 03/27/2014 1214   ALT 23 10/01/2020 1233   ALT 22 03/27/2014 1214   ALKPHOS 302 (H) 09/13/2020 1233   ALKPHOS 81 03/27/2014 1214   BILITOT 1.2 09/21/2020 1233   BILITOT 1.1 (H) 03/27/2014 1214   PROT 5.9 (L) 10/04/2020 1233   PROT 6.9 03/27/2014 1214   ALBUMIN 2.8 (L) 09/29/2020 1233   ALBUMIN 3.6 03/27/2014 1214    RADIOGRAPHIC STUDIES: No results found.  PERFORMANCE STATUS (ECOG) : 2 - Symptomatic, <50% confined to bed  Review of Systems Unless otherwise noted, a complete  review of systems is negative.  Physical Exam General: NAD Pulmonary: Unlabored Extremities: no edema, no joint deformities Skin: no rashes Neurological: Weakness but otherwise nonfocal  IMPRESSION: Patient known to me from the clinic.  She has continued to have vaginal bleeding since being admitted the hospital yesterday.  Patient has been seen by vascular with plan for embolization later today.  She is also been seen in consultation by radiation oncology with plan for possible XRT next week if needed.  Symptomatically, she endorses persistent lower abdominal pain.  Pain was reasonably well managed at home with use of oxycodone.  However, pain is much more severe in intensity today.  In the past 24 hours, she has received Norco, oxycodone, Toradol, and IV morphine.  Of those, patient states that the morphine has been the only thing really effective.  Will order morphine IV as needed.  Patient was previously referred to hospice but was not admitted as some of her cardiac medications were not on formulary and would be too costly to pay out-of-pocket.  Patient would prefer hospice involvement at home if possible.  However, this can be arranged outpatient if needed as it is currently not clear if patient will also require XRT, which could delay hospice enrollment. Additionally, patient would need a 90-day supply of her [place]lan[place] prior to hospice involvement.  PLAN: -Continue current scope of treatment -Morphine IV as needed for pain -Daily bowel regimen -Patient would be interested in hospice involvement if medication cost can be arranged -DNR/DNI -Will follow  Case discussed with Dr. Janese Banks  Time Total: 60 minutes  Visit consisted of counseling and education dealing with the complex and emotionally intense issues of symptom management and palliative care in the setting of serious and potentially life-threatening illness.Greater than 50%  of this time was spent counseling and coordinating care  related to the above assessment and plan.  Signed by: Altha Harm, PhD, NP-C

## 2020-10-11 NOTE — Consult Note (Signed)
NEW PATIENT EVALUATION  Name: Jaime Hall  MRN: [id number]  Date:   09/20/2020     DOB: [birth year]   This 70 y.o. 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 provider found  CHIEF COMPLAINT:  Chief Complaint  Patient presents with  . Vaginal Bleeding    DIAGNOSIS: The primary encounter diagnosis was Vaginal bleeding. A diagnosis of Endometrial cancer [facility]) was also pertinent to this visit.   PREVIOUS INVESTIGATIONS:  CT scans reviewed Labs reviewed Clinical notes reviewed  HPI: Patient is a 70 year old female well-known to our department having been treated back in 2018 when she presented with a high-grade serous carcinoma of the endometrium.  She had whole pelvic radiation therapy which she tolerated fairly well.  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  She is also been admitted for significant vaginal bleeding I have asked to evaluate her for possible palliative treatment to that region.  Previous omental biopsy has shown high-grade serous carcinoma.  She has a poor ejection fraction of 30 to 35%.  Cardiac cath was consistent with nonischemic cardiomyopathy.  Patient is currently on Eliquis.  I been asked to evaluate her for possible palliative radiation therapy.  Seen today in her hospital bed she is quite frail.  She continues significant vaginal bleeding.  PLANNED TREATMENT REGIMEN: Palliative radiation therapy to pelvis  PAST MEDICAL HISTORY:  has a past medical history of Anxiety, Cervicalgia, Chronic kidney disease, Coronary artery disease, Endometrial cancer ([place]), Essential hypertension, benign, Fibrocystic breast disease, GERD (gastroesophageal reflux disease), Gestational hypertension, Heart murmur, HFrEF (heart failure with reduced ejection fraction) ([place]), History of anemia, History of blood transfusion,  Hodgkin's lymphoma ([place]) (2011), Mixed Ischemic and Nonischemic Cardiomyopathy, Osteoarthritis, and Polycystic ovarian disease.    PAST SURGICAL HISTORY:  Past Surgical History:  Procedure Laterality Date  . ABDOMINAL HYSTERECTOMY    . bladder sling    . CARDIAC CATHETERIZATION  02/2012   [facility] 1 stent place  . CERVICAL POLYPECTOMY    . CHOLECYSTECTOMY  1982  . COLONOSCOPY WITH PROPOFOL N/A 02/05/2015   Procedure: COLONOSCOPY WITH PROPOFOL;  Surgeon: Lucilla Lame, MD;  Location: [facility];  Service: Endoscopy;  Laterality: N/A;  . CORONARY ANGIOPLASTY  02/2012   left/right s/p balloon  . CYSTOGRAM N/A 08/17/2016   Procedure: CYSTOGRAM;  Surgeon: Hollice Espy, MD;  Location: [facility];  Service: Urology;  Laterality: N/A;  . CYSTOSCOPY N/A 08/17/2016   Procedure: CYSTOSCOPY EXAM UNDER ANESTHESIA;  Surgeon: Hollice Espy, MD;  Location: [facility];  Service: Urology;  Laterality: N/A;  . CYSTOSCOPY W/ RETROGRADES Bilateral 08/17/2016   Procedure: CYSTOSCOPY WITH RETROGRADE PYELOGRAM;  Surgeon: Hollice Espy, MD;  Location: [facility];  Service: Urology;  Laterality: Bilateral;  . CYSTOSCOPY WITH STENT PLACEMENT Right 08/17/2016   Procedure: CYSTOSCOPY WITH STENT PLACEMENT;  Surgeon: Hollice Espy, MD;  Location: [facility];  Service: Urology;  Laterality: Right;  . heart stent'  2013  . kidney stent Right 2018  . LYMPH NODE BIOPSY  2011   diagnosis of hodgkins lymphoma  . PELVIC LYMPH NODE DISSECTION N/A 07/29/2016   Procedure: PELVIC/AORTIC LYMPH NODE SAMPLING;  Surgeon: [name], MD;  Location: [facility];  Service: Gynecology;  Laterality: N/A;  . PORTA CATH INSERTION N/A 09/22/2016   Procedure: Glori Luis Cath Insertion;  Surgeon: [name], MD;  Location: [place] [facility];  Service: Cardiovascular;  Laterality:  N/A;  . PORTA CATH INSERTION N/A 10/27/2019   Procedure: PORTA CATH INSERTION;  Surgeon: [name], MD;  Location: [place] [facility];  Service:  Cardiovascular;  Laterality: N/A;  . PORTA CATH REMOVAL N/A 11/17/2016   Procedure: Glori Luis Cath Removal;  Surgeon: [name], MD;  Location: [place] [facility];  Service: Cardiovascular;  Laterality: N/A;  . RIGHT/LEFT HEART CATH AND CORONARY ANGIOGRAPHY N/A 12/04/2019   Procedure: RIGHT/LEFT HEART CATH AND CORONARY ANGIOGRAPHY;  Surgeon: Wellington Hampshire, MD;  Location: [place] [facility];  Service: Cardiovascular;  Laterality: N/A;  . ROBOTIC ASSISTED TOTAL HYSTERECTOMY WITH BILATERAL SALPINGO OOPHERECTOMY N/A 07/29/2016   Procedure: ROBOTIC ASSISTED TOTAL HYSTERECTOMY WITH BILATERAL SALPINGO OOPHORECTOMY;  Surgeon: [name], MD;  Location: [facility];  Service: Gynecology;  Laterality: N/A;  . SENTINEL NODE BIOPSY N/A 07/29/2016   Procedure: SENTINEL NODE BIOPSY;  Surgeon: [name], MD;  Location: [facility];  Service: Gynecology;  Laterality: N/A;  . transobturator sling N/A 2009   [place]    FAMILY HISTORY: family history includes ALS in her father; Breast cancer in her maternal aunt; Cancer in her maternal aunt and maternal grandfather; Colon cancer in her maternal aunt; Dementia in her mother; Diabetes in her brother; Non-Hodgkin's lymphoma in her cousin; Polymyositis in her father; Stroke in her maternal grandfather and maternal grandmother.  SOCIAL HISTORY:  reports that she quit smoking about 18 years ago. Her smoking use included cigarettes. She has a 30.00 pack-year smoking history. She has never used smokeless tobacco. She reports previous alcohol use. She reports that she does not use drugs.  ALLERGIES: Metoprolol, Adhesive [tape], Antifungal [miconazole nitrate], Sulfa antibiotics, and Z-pak [azithromycin]  MEDICATIONS:  Current Facility-Administered Medications  Medication Dose Route Frequency Provider Last Rate Last Admin  . 0.9 %  sodium chloride infusion (Manually program via Guardrails IV Fluids)   Intravenous Once Athena Masse, MD       . acetaminophen (TYLENOL) tablet 650 mg  650 mg Oral Q6H PRN Athena Masse, MD       Or  . acetaminophen (TYLENOL) suppository 650 mg  650 mg Rectal Q6H PRN Athena Masse, MD      . amitriptyline (ELAVIL) tablet 25 mg  25 mg Oral QHS Eugenie Filler, MD      . atorvastatin (LIPITOR) tablet 10 mg  10 mg Oral Daily Eugenie Filler, MD      . Chlorhexidine Gluconate Cloth 2 % PADS 6 each  6 each Topical Daily Eugenie Filler, MD      . ketorolac (TORADOL) 30 MG/ML injection 7.5 mg  7.5 mg Intravenous Q6H PRN Eugenie Filler, MD      . Derrill Memo ON 10/09/2020] levothyroxine (SYNTHROID) tablet 75 mcg  75 mcg Oral QAC breakfast Eugenie Filler, MD      . LORazepam (ATIVAN) tablet 0.5 mg  0.5 mg Oral Q6H PRN Eugenie Filler, MD      . morphine CONCENTRATE 10 mg / 0.5 ml oral solution 5 mg  5 mg Oral Q6H PRN Eugenie Filler, MD      . ondansetron [facility]) tablet 4 mg  4 mg Oral Q6H PRN Athena Masse, MD       Or  . ondansetron [facility]) injection 4 mg  4 mg Intravenous Q6H PRN Athena Masse, MD   4 mg at 10-22-20 0943  . oxyCODONE (Oxy IR/ROXICODONE) immediate release tablet 5 mg  5 mg Oral  Q4H PRN Eugenie Filler, MD   5 mg at Oct 28, 2020 0934  . pantoprazole (PROTONIX) injection 40 mg  40 mg Intravenous Q24H Eugenie Filler, MD      . tranexamic acid (LYSTEDA) tablet 1,300 mg  1,300 mg Oral TID Eugenie Filler, MD       Facility-Administered Medications Ordered in Other Encounters  Medication Dose Route Frequency Provider Last Rate Last Admin  . heparin lock flush 100 unit/mL  500 Units Intravenous Once Sindy Guadeloupe, MD      . sodium chloride flush (NS) 0.9 % injection 10 mL  10 mL Intravenous PRN Sindy Guadeloupe, MD   10 mL at 12/07/19 0914  . sodium chloride flush (NS) 0.9 % injection 10 mL  10 mL Intravenous PRN Sindy Guadeloupe, MD   10 mL at 01/04/20 0850    ECOG PERFORMANCE STATUS:  3 - Symptomatic, >50% confined to bed  REVIEW OF SYSTEMS: Patient has known  CKD, CHF, CAD, PE currently on Eliquis. Patient denies any weight loss, fatigue, weakness, fever, chills or night sweats. Patient denies any loss of vision, blurred vision. Patient denies any ringing  of the ears or hearing loss. No irregular heartbeat. Patient denies heart murmur or history of fainting. Patient denies any chest pain or pain radiating to her upper extremities. Patient denies any shortness of breath, difficulty breathing at night, cough or hemoptysis. Patient denies any swelling in the lower legs. Patient denies any nausea vomiting, vomiting of blood, or coffee ground material in the vomitus. Patient denies any stomach pain. Patient states has had normal bowel movements no significant constipation or diarrhea. Patient denies any dysuria, hematuria or significant nocturia. Patient denies any problems walking, swelling in the joints or loss of balance. Patient denies any skin changes, loss of hair or loss of weight. Patient denies any excessive worrying or anxiety or significant depression. Patient denies any problems with insomnia. Patient denies excessive thirst, polyuria, polydipsia. Patient denies any swollen glands, patient denies easy bruising or easy bleeding. Patient denies any recent infections, allergies or URI. Patient "s visual fields have not changed significantly in recent time.   PHYSICAL EXAM: BP (!) 122/44 (BP Location: Left Arm)   Pulse 80   Temp 98 F (36.7 C)   Resp 18   Ht 5' 1.75" (1.568 m)   Wt 134 lb 14.4 oz (61.2 kg)   SpO2 97%   BMI 24.87 kg/m  Frail-appearing female in NAD.  Well-developed well-nourished patient in NAD. HEENT reveals PERLA, EOMI, discs not visualized.  Oral cavity is clear. No oral mucosal lesions are identified. Neck is clear without evidence of cervical or supraclavicular adenopathy. Lungs are clear to A&P. Cardiac examination is essentially unremarkable with regular rate and rhythm without murmur rub or thrill. Abdomen is benign with no  organomegaly or masses noted. Motor sensory and DTR levels are equal and symmetric in the upper and lower extremities. Cranial nerves II through XII are grossly intact. Proprioception is intact. No peripheral adenopathy or edema is identified. No motor or sensory levels are noted. Crude visual fields are within normal range.  LABORATORY DATA: Labs reviewed    RADIOLOGY RESULTS: CT scan reviewed   IMPRESSION: Vaginal bleeding and patient with known locally advanced peritoneal carcinomatosis and hit with history of high-grade serous carcinoma and prior radiation therapy back in 2018  PLAN: At this time and like to target the area of the pelvis target area of probable bleeding and deliver 30 Gray in  10 fractions.  Would use a 3-dimensional treatment planning hybrid technique to spare critical structures such as her rectum and bladder.  I have emergently simulated her today we will start treatments first thing next week.  Risks and benefits of treatment occluding possible diarrhea fatigue alteration blood counts possible increase in lower urinary tract symptoms and problems with retreatment of the pelvis all were discussed in detail with the patient.  We simulate her today patient comprehends her recommendations well.  I would like to take this opportunity to thank you for allowing me to participate in the care of your patient.Noreene Filbert, MD

## 2020-10-11 NOTE — Op Note (Signed)
[facility] Percutaneous Study/Intervention Procedural Note   Date of Surgery: 09/14/2020  Surgeon(s): Hortencia Pilar  Assistants:none  Pre-operative Diagnosis: Hypotensive shock secondary to dysfunctional vaginal bleeding; metastatic uterine carcinoma  Post-operative diagnosis: Same  Procedure(s) Performed: 1. Ultrasound guidance for vascular access bilateral femoral arteries 2. Catheter placement into left hypogastric artery third order branches from right femoral approach and right hypogastric artery third order branches from left femoral approach 3. Selective pelvic arteriograms including selective imaging of both distal hypogastric arteries 4. Micro-bead embolization with 500-700  polyvinyl alcohol beads to the branches of the left hypogastric artery 5. Coil embolization selective branches of the hypogastric artery bilaterally using Ruby coils  6. StarClose closure device bilateral femoral arteries  EBL: 15 cc  Contrast: 45 cc  Fluoro Time: 21.5 min  Moderate Conscious Sedation time: Continuous ECG pulse oximetry and cardiopulmonary monitoring was performed throughout the entire procedure by the interventional radiology nurse parenteral Versed and fentanyl were administered.  Total sedation time was 1 hour 18 minutes 15 seconds.  Indications: Patient is a 70 y.o. female with severe vaginal bleeding secondary to advanced incurable endometrial carcinoma.  She is already received radiation therapy and the possibility of additional embolization to try to mitigate her bleeding was discussed.. Risks and benefits are discussed and informed consent is obtained  Procedure: The patient was identified and appropriate procedural time out was performed. The patient was then placed supine on the table and prepped and draped in the usual sterile fashion. Moderate conscious sedation was  administered throughout the procedure.    Ultrasound was used to evaluate the right common femoral artery. It was patent . A digital ultrasound image was acquired. A micropuncture needle was used to access the right common femoral artery under direct ultrasound guidance and a permanent image was performed. A microwire was then advanced without difficulty followed by micro sheath.  A 0.035 J wire was advanced without resistance and a 5Fr sheath was placed.  2000 units of heparin were given.   Small hand-injection contrast was then utilized to demonstrate the aortic bifurcation as well as the left iliac bifurcation and in RAO projection.  I then crossed the aortic bifurcation and advanced to the left iliac bifurcation. Using a Kumpe catheter, I was able to cannulate the hypogastric artery on the left. Selective imaging was performed of the hypogastric artery which demonstrated 2 prominent medial branches. Using a Kumpe catheter, I was able to advance into the 2 medial branches and park the catheter.   Selective imaging was performed.  Branch artery embolization using approximately 1 cc of 500 700  polyvinyl alcohol beads into the first branch and then later 1 cc in the second branch for selective embolization.  This was followed by two 4 mm x 6 mm soft Ruby coils in the more proximal branch and one 4 mm x 6 mm Ruby coil in the more distal branch.  Follow-up imaging demonstrated minimal forward flow into the pelvic area. The microcatheter was then removed. I then turned my attention to the right uterine artery.   The anatomy was not favorable for an ipsilateral approach, so a left sided access was then performed.  The left femoral artery was found to be patent on ultrasound, and it was then accessed under direct ultrasound guidance without difficulty and a permanent image was recorded.  I then crossed the aortic bifurcation and advanced into the right hypogastric artery.  I was able to cannulate the  right hypogastric artery with  the Kumpe catheter from the left femoral approach and using a prograde catheter and then advanced this into the main right hypogastric artery. Selective imaging was performed in the right hypogastric artery which demonstrated a prominent branch coursing medially.  I was able to advance the prograde into the hypogastric branch artery. Selective imaging was performed.  I then packed this branch artery with 3 Ruby coils a 4 mm x 15 cm a 3 mm x 15 cm a 3 mm x 5 cm soft Ruby coil.  After treating with coils artery for selective embolization  there was minimal forward flow into the uterus from the right uterine artery. The diagnostic catheter was then removed. I elected to terminate the procedure.   The left sheath was removed was removed first and then the right.  StarClose closure device was deployed in the right and left femoral artery with excellent hemostatic result. The patient was taken to the recovery room in stable condition having tolerated the procedure well.   Disposition: Patient was taken to the recovery room in stable condition having tolerated the procedure well.  Complications: None

## 2020-10-11 NOTE — Consult Note (Signed)
Hematology/Oncology Consult note [facility] Telephone:([phone] [phone]  Patient Care Team: [name], MD as PCP - General (Internal Medicine) Wellington Hampshire, MD as PCP - Cardiology (Cardiology) [name], MD as Consulting Physician (General Surgery) Mellody Drown, MD as Referring Physician (Obstetrics and Gynecology) [name], MD as Referring Physician (Obstetrics and Gynecology) Hollice Espy, MD as Consulting Physician (Urology) [name], RN as Registered Nurse Sindy Guadeloupe, MD as Consulting Physician (Oncology) Leona Singleton, RN as Case Manager   Name of the patient: Jaime Hall  YO:[id number]  1950/06/30    Reason for referral-  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 Irine Seal  Date of visit: 09/16/2020    History of presenting illness-patient is a 70 year old female with recurrent metastatic high-grade endometrial carcinoma.  She previously received CarboTaxol chemotherapy and most recently received Keytruda but has been off treatment since August 2021 due to repeated hospitalizations and worsening cardiac status.  She has not been under hospice care yet as patient is on multiple cardiac medications which would not be covered by hospice.  She also has a diagnosis of pulmonary embolism in September 2021 and she was on low-dose Eliquis 2.5 mg twice daily for the same.  Patient has been having on and off vaginal bleeding which was not hemodynamically significant in the past.  However patient presented with worsening vaginal bleeding this time And her hemoglobin dropped to 6.5 during this present hospitalization requiring a blood transfusion.  Oncology consulted for further management.  Patient seeen in recovery room today. She was drowsy post procedure. Reports mild abdominal  pain   ECOG PS- 2  Pain scale- 3   Review of systems- Review of Systems  Constitutional: Positive for malaise/fatigue. Negative for chills, fever and weight loss.  HENT: Negative for congestion, ear discharge and nosebleeds.   Eyes: Negative for blurred vision.  Respiratory: Negative for cough, hemoptysis, sputum production, shortness of breath and wheezing.   Cardiovascular: Negative for chest pain, palpitations, orthopnea and claudication.  Gastrointestinal: Positive for abdominal pain. Negative for blood in stool, constipation, diarrhea, heartburn, melena, nausea and vomiting.  Genitourinary: Negative for dysuria, flank pain, frequency, hematuria and urgency.       Vaginal bleeding  Musculoskeletal: Negative for back pain, joint pain and myalgias.  Skin: Negative for rash.  Neurological: Negative for dizziness, tingling, focal weakness, seizures, weakness and headaches.  Endo/Heme/Allergies: Does not bruise/bleed easily.  Psychiatric/Behavioral: Negative for depression and suicidal ideas. The patient does not have insomnia.     Allergies  Allergen Reactions  . Metoprolol Other (See Comments)    Fatigue  . Adhesive [Tape] Other (See Comments)    Skin Irritation   . Antifungal [Miconazole Nitrate] Rash  . Sulfa Antibiotics Nausea And Vomiting and Rash  . Z-Pak [Azithromycin] Itching    Patient Active Problem List   Diagnosis Date Noted  . Acute blood loss anemia 09/24/2020  . Chronic anticoagulation 10/07/2020  . History of pulmonary embolism 09/30/2020  . Vaginal bleeding 09/21/2020  . Sepsis ([place]) 08/13/2020  . Chronic systolic CHF (congestive heart failure) ([place]) 07/20/2020  . SVT (supraventricular tachycardia) ([place]) 07/20/2020  . Cardiomyopathy ([place]) 07/20/2020  . Hypotension 07/20/2020  . NSTEMI (non-ST elevated myocardial infarction) ([place]) 07/20/2020  . Acute on chronic combined systolic and diastolic CHF (congestive heart failure) ([place]) 05/19/2020  . PE  (pulmonary thromboembolism) ([place]) 05/19/2020  . GERD (  gastroesophageal reflux disease) 05/19/2020  . UTI (urinary tract infection) 05/19/2020  . Hospital discharge follow-up 03/02/2020  . Hemoptysis 02/20/2020  . Acute on chronic congestive heart failure ([place])   . Palliative care encounter   . Acute pulmonary embolism ([place]) 02/14/2020  . Thrombocytopenia ([place]) 02/14/2020  . Hypothyroidism 02/14/2020  . Chest pain 12/29/2019  . Leukocytosis 12/29/2019  . HTN (hypertension) 12/29/2019  . HLD (hyperlipidemia) 12/29/2019  . Elevated troponin 12/29/2019  . Stage 3b chronic kidney disease ([place]) 12/29/2019  . Depression with anxiety 12/29/2019  . Abnormal cardiovascular stress test   . Dyspnea on exertion   . Goals of care, counseling/discussion 10/20/2019  . 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 Peritoneal metastases ([place]) 10/16/2019  . Right anterior knee pain 07/25/2019  . Educated about COVID-19 virus infection 11/15/2018  . Cramps, muscle, general 04/06/2018  . B12 deficiency anemia 07/24/2017  . Normocytic anemia 07/18/2017  . Back pain 07/18/2017  . Chemotherapy-induced peripheral neuropathy ([place]) 12/10/2016  . Hyperlipidemia 08/13/2016  . Hx of colonic polyps 08/13/2016  . Benign neoplasm of sigmoid colon 08/13/2016  . Endometrial cancer ([place]) 08/13/2016  . 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 Lymphedema 07/20/2016  . Chronic venous insufficiency 07/20/2016  . PAD (peripheral artery disease) ([place]) 07/20/2016  . Class 1 obesity due to excess calories without serious comorbidity with body mass index (BMI) of 34.0 to 34.9 in adult 05/27/2016  . Leg cramps 05/27/2016  . Bronchiectasis ([place]) 12/19/2015  . Pre-syncope 12/19/2015  . Prolapsed, uterovaginal, incomplete 06/24/2014  . Routine general medical examination at a health care facility 12/30/2012  . Obesity (BMI 30-39.9) 12/30/2012  . Hx of multiple pulmonary nodules 12/28/2012  .  Plantar fasciitis of right foot 12/28/2012  . Hip pain, bilateral 09/12/2012  . History of Hodgkin's lymphoma 09/12/2012  . Coronary artery disease   . Chest pain on exertion 02/29/2012     Past Medical History:  Diagnosis Date  . Anxiety   . Cervicalgia   . Chronic kidney disease   . Coronary artery disease    a. 02/2012 Stress echo: severe anterior wall ischemia;  b. 02/2012 Cath/PCI: LAD 95p (3.0 x 15 Xience EX DES), D1 90ost (PTCA - bifurcational dzs), EF 45% with anterior HK;  c. 02/2013 Ex MV: fixed anterior defect w/ minor reversibility, nl EF-->Med Rx; d. 11/2019 MV: EF 41%, ant/antlat ischemia-->high risk scan; e. 11/2019 Cath: LM nl, LAD 40ost/p, patent LAD stent, LCX nl, RCA nl, RPDA/RPAV nl, EF 30  . Endometrial cancer ([place])    a. 07/2016 s/p robotic hysterectomy, BSO w/ washings, sentinel node inj, mapping, bx, adhesiolysis.  . Essential hypertension, benign   . Fibrocystic breast disease   . GERD (gastroesophageal reflux disease)   . Gestational hypertension   . Heart murmur   . HFrEF (heart failure with reduced ejection fraction) ([place])    a. 05/2020 Echo: EF <20%, glob HK.  Marland Kitchen History of anemia   . History of blood transfusion   . Hodgkin's lymphoma ([place]) 2011   a. s/p radiation and chemo therapy  . Mixed Ischemic and Nonischemic Cardiomyopathy    a. 05/2020 Echo: EF <20%, glob HK, Nl RV size/fxn, mildly dil LA. Mild MR. Mild to mod Ao sclerosis.  . Osteoarthritis   . Polycystic ovarian disease      Past Surgical History:  Procedure Laterality Date  . ABDOMINAL HYSTERECTOMY    . bladder sling    . CARDIAC CATHETERIZATION  02/2012  [place] 1 stent place  . CERVICAL POLYPECTOMY    . CHOLECYSTECTOMY  1982  . COLONOSCOPY WITH PROPOFOL N/A 02/05/2015   Procedure: COLONOSCOPY WITH PROPOFOL;  Surgeon: Lucilla Lame, MD;  Location: [facility] ENDOSCOPY;  Service: Endoscopy;  Laterality: N/A;  . CORONARY ANGIOPLASTY  02/2012   left/right s/p balloon  . CYSTOGRAM N/A 08/17/2016    Procedure: CYSTOGRAM;  Surgeon: Hollice Espy, MD;  Location: [facility] ORS;  Service: Urology;  Laterality: N/A;  . CYSTOSCOPY N/A 08/17/2016   Procedure: CYSTOSCOPY EXAM UNDER ANESTHESIA;  Surgeon: Hollice Espy, MD;  Location: [facility] ORS;  Service: Urology;  Laterality: N/A;  . CYSTOSCOPY W/ RETROGRADES Bilateral 08/17/2016   Procedure: CYSTOSCOPY WITH RETROGRADE PYELOGRAM;  Surgeon: Hollice Espy, MD;  Location: [facility] ORS;  Service: Urology;  Laterality: Bilateral;  . CYSTOSCOPY WITH STENT PLACEMENT Right 08/17/2016   Procedure: CYSTOSCOPY WITH STENT PLACEMENT;  Surgeon: Hollice Espy, MD;  Location: [facility] ORS;  Service: Urology;  Laterality: Right;  . heart stent'  2013  . kidney stent Right 2018  . LYMPH NODE BIOPSY  2011   diagnosis of hodgkins lymphoma  . PELVIC LYMPH NODE DISSECTION N/A 07/29/2016   Procedure: PELVIC/AORTIC LYMPH NODE SAMPLING;  Surgeon: [name], MD;  Location: [facility] ORS;  Service: Gynecology;  Laterality: N/A;  . PORTA CATH INSERTION N/A 09/22/2016   Procedure: Glori Luis Cath Insertion;  Surgeon: [name], MD;  Location: [place] [facility];  Service: Cardiovascular;  Laterality: N/A;  . PORTA CATH INSERTION N/A 10/27/2019   Procedure: PORTA CATH INSERTION;  Surgeon: [name], MD;  Location: [place] [facility];  Service: Cardiovascular;  Laterality: N/A;  . PORTA CATH REMOVAL N/A 11/17/2016   Procedure: Glori Luis Cath Removal;  Surgeon: [name], MD;  Location: [place] [facility];  Service: Cardiovascular;  Laterality: N/A;  . RIGHT/LEFT HEART CATH AND CORONARY ANGIOGRAPHY N/A 12/04/2019   Procedure: RIGHT/LEFT HEART CATH AND CORONARY ANGIOGRAPHY;  Surgeon: Wellington Hampshire, MD;  Location: [place] [facility];  Service: Cardiovascular;  Laterality: N/A;  . ROBOTIC ASSISTED TOTAL HYSTERECTOMY WITH BILATERAL SALPINGO OOPHERECTOMY N/A 07/29/2016   Procedure: ROBOTIC ASSISTED TOTAL HYSTERECTOMY WITH BILATERAL SALPINGO OOPHORECTOMY;  Surgeon:  [name], MD;  Location: [facility];  Service: Gynecology;  Laterality: N/A;  . SENTINEL NODE BIOPSY N/A 07/29/2016   Procedure: SENTINEL NODE BIOPSY;  Surgeon: [name], MD;  Location: [facility];  Service: Gynecology;  Laterality: N/A;  . transobturator sling N/A 2009   [place]    Social History   Socioeconomic History  . Marital status: Widowed    Spouse name: Not on file  . Number of children: 2  . Years of education: 46  . Highest education level: High school graduate  Occupational History  . Occupation: Retired  Tobacco Use  . Smoking status: Former Smoker    Packs/day: 1.00    Years: 30.00    Pack years: 30.00    Types: Cigarettes    Quit date: 07/24/2002    Years since quitting: 18.2  . Smokeless tobacco: Never Used  . Tobacco comment: quit smoking in 2000  Vaping Use  . Vaping Use: Never used  Substance and Sexual Activity  . Alcohol use: Not Currently  . Drug use: No  . Sexual activity: Not Currently  Other Topics Concern  . Not on file  Social History Narrative  . Not on file   Social Determinants of Health   Financial Resource Strain: Medium Risk  . Difficulty  of Paying Living Expenses: Somewhat hard  Food Insecurity: No Food Insecurity  . Worried About Charity fundraiser in the Last Year: Never true  . Ran Out of Food in the Last Year: Never true  Transportation Needs: No Transportation Needs  . Lack of Transportation (Medical): No  . Lack of Transportation (Non-Medical): No  Physical Activity: Unknown  . Days of Exercise per Week: 0 days  . Minutes of Exercise per Session: Not on file  Stress: No Stress Concern Present  . Feeling of Stress : Only a little  Social Connections: Unknown  . Frequency of Communication with Friends and Family: More than three times a week  . Frequency of Social Gatherings with Friends and Family: More than three times a week  . Attends Religious Services: Not on file  . Active Member of Clubs  or Organizations: Not on file  . Attends Archivist Meetings: Not on file  . Marital Status: Not on file  Intimate Partner Violence: Not At Risk  . Fear of Current or Ex-Partner: No  . Emotionally Abused: No  . Physically Abused: No  . Sexually Abused: No     Family History  Problem Relation Age of Onset  . ALS Father   . Polymyositis Father   . Diabetes Brother   . Dementia Mother   . Cancer Maternal Aunt        breast  . Breast cancer Maternal Aunt        30's  . Stroke Maternal Grandmother   . Cancer Maternal Grandfather        prostate  . Stroke Maternal Grandfather   . Colon cancer Maternal Aunt   . Non-Hodgkin's lymphoma Cousin      Current Facility-Administered Medications:  .  0.9 %  sodium chloride infusion (Manually program via Guardrails IV Fluids), , Intravenous, Once, Judd Gaudier V, MD .  0.9 %  sodium chloride infusion, , Intravenous, Continuous, Stegmayer, Kimberly A, PA-C .  0.9 %  sodium chloride infusion, , Intravenous, Continuous, Stegmayer, Kimberly A, PA-C .  acetaminophen (TYLENOL) tablet 650 mg, 650 mg, Oral, Q6H PRN **OR** acetaminophen (TYLENOL) suppository 650 mg, 650 mg, Rectal, Q6H PRN, Athena Masse, MD .  amitriptyline (ELAVIL) tablet 25 mg, 25 mg, Oral, QHS, Eugenie Filler, MD .  atorvastatin (LIPITOR) tablet 10 mg, 10 mg, Oral, Daily, Eugenie Filler, MD .  Derrill Memo ON Nov 05, 2020] ceFAZolin (ANCEF) 1 g in sodium chloride 0.9 % 100 mL IVPB, 1 g, Intravenous, Once, Stegmayer, [employer], PA-C .  Chlorhexidine Gluconate Cloth 2 % PADS 6 each, 6 each, Topical, Daily, Eugenie Filler, MD .  diphenhydrAMINE (BENADRYL) injection 50 mg, 50 mg, Intravenous, Once PRN, Stegmayer, Kimberly A, PA-C .  famotidine (PEPCID) tablet 40 mg, 40 mg, Oral, Once PRN, Stegmayer, Janalyn [place], PA-C .  HYDROmorphone (DILAUDID) injection 1 mg, 1 mg, Intravenous, Once PRN, Stegmayer, Kimberly A, PA-C .  ketorolac (TORADOL) 30 MG/ML injection 7.5 mg, 7.5  mg, Intravenous, Q6H PRN, Eugenie Filler, MD .  Derrill Memo ON 11/05/20] levothyroxine (SYNTHROID) tablet 75 mcg, 75 mcg, Oral, QAC breakfast, Eugenie Filler, MD .  LORazepam (ATIVAN) tablet 0.5 mg, 0.5 mg, Oral, Q6H PRN, Eugenie Filler, MD .  methylPREDNISolone sodium succinate (SOLU-MEDROL) 125 mg/2 mL injection 125 mg, 125 mg, Intravenous, Once PRN, Stegmayer, Kimberly A, PA-C .  midazolam (VERSED) 2 MG/ML syrup 8 mg, 8 mg, Oral, Once PRN, Stegmayer, Kimberly A, PA-C .  morphine 2 MG/ML  injection 2-4 mg, 2-4 mg, Intravenous, Q2H PRN, [place], Vonna Kotyk R, NP .  morphine CONCENTRATE 10 MG/0.5ML oral solution 5 mg, 5 mg, Oral, Q6H PRN, Eugenie Filler, MD .  ondansetron [facility]) tablet 4 mg, 4 mg, Oral, Q6H PRN **OR** ondansetron (ZOFRAN) injection 4 mg, 4 mg, Intravenous, Q6H PRN, Athena Masse, MD, 4 mg at 09/19/2020 0943 .  ondansetron (ZOFRAN) injection 4 mg, 4 mg, Intravenous, Q6H PRN, Stegmayer, Kimberly A, PA-C .  oxyCODONE (Oxy IR/ROXICODONE) immediate release tablet 5 mg, 5 mg, Oral, Q4H PRN, Eugenie Filler, MD, 5 mg at 09/28/2020 0934 .  pantoprazole (PROTONIX) injection 40 mg, 40 mg, Intravenous, Q24H, Eugenie Filler, MD .  tranexamic acid (LYSTEDA) tablet 1,300 mg, 1,300 mg, Oral, TID, Eugenie Filler, MD  Facility-Administered Medications Ordered in Other Encounters:  .  heparin lock flush 100 unit/mL, 500 Units, Intravenous, Once, Sindy Guadeloupe, MD .  sodium chloride flush (NS) 0.9 % injection 10 mL, 10 mL, Intravenous, PRN, Sindy Guadeloupe, MD, 10 mL at 12/07/19 0914 .  sodium chloride flush (NS) 0.9 % injection 10 mL, 10 mL, Intravenous, PRN, Sindy Guadeloupe, MD, 10 mL at 01/04/20 0850   Physical exam:  Vitals:   09/25/2020 0342 09/28/2020 0359 09/17/2020 0609 10/07/2020 0717  BP: (!) 100/37 (!) 110/45 (!) 109/42 (!) 122/44  Pulse: 70 71 72 80  Resp: 20 20 19 18   Temp: 97.9 F (36.6 C) 98 F (36.7 C) 97.9 F (36.6 C) 98 F (36.7 C)  TempSrc: Oral Oral Oral    SpO2: 95% 94% 97% 97%  Weight:      Height:       Physical Exam Cardiovascular:     Rate and Rhythm: Normal rate and regular rhythm.     Heart sounds: Normal heart sounds.  Pulmonary:     Effort: Pulmonary effort is normal.     Breath sounds: Normal breath sounds.  Abdominal:     General: Bowel sounds are normal. There is no distension.     Palpations: Abdomen is soft.     Tenderness: There is no abdominal tenderness.  Skin:    General: Skin is warm and dry.  Neurological:     Mental Status: She is oriented to person, place, and time.        CMP Latest Ref Rng & Units Nov 09, 2020  Glucose 70 - 99 mg/dL 165(H)  BUN 8 - 23 mg/dL 27(H)  Creatinine 0.44 - 1.00 mg/dL 1.92(H)  Sodium 135 - 145 mmol/L 135  Potassium 3.5 - 5.1 mmol/L 5.9(H)  Chloride 98 - 111 mmol/L 105  CO2 22 - 32 mmol/L 17(L)  Calcium 8.9 - 10.3 mg/dL 9.3  Total Protein 6.5 - 8.1 g/dL 6.5  Total Bilirubin 0.3 - 1.2 mg/dL 1.2  Alkaline Phos 38 - 126 U/L 313(H)  AST 15 - 41 U/L 57(H)  ALT 0 - 44 U/L 21   CBC Latest Ref Rng & Units 10/08/2020  WBC 4.0 - 10.5 K/uL PENDING  Hemoglobin 12.0 - 15.0 g/dL 8.5(L)  Hematocrit 36.0 - 46.0 % 25.7(L)  Platelets 150 - 400 K/uL 319    @IMAGES @  No results found.  Assessment and plan- Patient is a 70 y.o. female with metastatic high-grade endometrial carcinoma currently off treatment on the best supportive care.  Last received Keytruda in August 2021 admitted for worsening vaginal bleeding  Hemodynamically significant vaginal bleeding: She is s/p 1 unit of blood transfusion.  No local intervention possible by GYN.  Okay to hold Eliquis at this time given risks of bleeding exceeds risk of clotting.  Also spent more than 6 months since the diagnosis of her PE.  Okay to proceed with Lysteda 1.3 g 3 times daily to control bleeding.  We also consulted vascular surgery to see if there would be a role for embolization of the uterine artery which we would be doing today.  If  that does not control bleeding palliative radiation remains an option.  Monitor H&H daily and if it remains stable without the need for blood transfusion okay for the patient to be discharged.  History of PE: Given the patient has had a significant vaginal bleed requiring blood transfusion I think it would be okay to stop Eliquis permanently at this time.  Leucocytosis/ neutrophilia: Pathology has reviewed smear. Per my discussion with Dr. Ronnald Ramp: "Significant mature neutrophilia, with minimal left shift in maturation, and no definite circulating blasts. No significant eosinophilia or basophilia, and no increase in myelocytes, so it doesn't look typical for a myeloproliferative process/CML. Background normocytic anemia. Normal platelet count and morphology  Clinically as well I suspect this is a leukemoid reaction givwn predominant neutrophilia. Flow cytometry ordered for tomorrow.    Visit Diagnosis 1. Vaginal bleeding   2. Endometrial cancer ([place])   3. History of pulmonary embolism                                                                                 4. Leucocytosis  Dr. Randa Evens, MD, MPH [facility] [id number] 09/17/2020 10:17 PM

## 2020-10-11 NOTE — Progress Notes (Signed)
Consult History and Physical   SERVICE: Gynecology-[facility]   Patient Name: Jaime Hall Patient MRN:   [id number]  [id number] pain and heavy vaginal bleeding . Seen in ED last pm with heavy vaginal bleeding  She is s/p Stage 1B endometrial cancer 2018 ( Dr [name] - [facility] + Dr Leonides Schanz [facility]) Now with know metastatic disease . Multiple medical problems   Bleeding has been off and on for the past yr and 2 days ago started becoming heavy    Review of Systems: positives in bold GEN:   fevers, chills, weight changes, appetite changes, fatigue, night sweats HEENT:  HA, vision changes, hearing loss, congestion, rhinorrhea, sinus pressure, dysphagia CV:   CP, palpitations PULM:  SOB, cough GI:  abd pain, N/V/D/C GU:  dysuria, urgency, frequency, + Pelvic pain . + vaginal bleeding  MSK:  arthralgias, myalgias, back pain, swelling SKIN:  rashes, color changes, pallor NEURO:  numbness, weakness, tingling, seizures, dizziness, tremors PSYCH:  depression, anxiety, behavioral problems, confusion  HEME/LYMPH:  easy bruising or bleeding ENDO:  heat/cold intolerance  Past Obstetrical History: OB History   No obstetric history on file.     Past Gynecologic History: No LMP recorded. Patient has had a hysterectomy. 2018  Past Medical History: Past Medical History:  Diagnosis Date  . Anxiety   . Cervicalgia   . Chronic kidney disease   . Coronary artery disease    a. 02/2012 Stress echo: severe anterior wall ischemia;  b. 02/2012 Cath/PCI: LAD 95p (3.0 x 15 Xience EX DES), D1 90ost (PTCA - bifurcational dzs), EF 45% with anterior HK;  c. 02/2013 Ex MV: fixed anterior defect w/ minor reversibility, nl EF-->Med Rx; d. 11/2019 MV: EF 41%, ant/antlat ischemia-->high risk scan; e. 11/2019 Cath: LM nl, LAD 40ost/p, patent LAD stent, LCX nl, RCA nl, RPDA/RPAV nl, EF 30  . Endometrial cancer ([place])    a. 07/2016 s/p robotic hysterectomy, BSO w/ washings, sentinel node inj, mapping, bx, adhesiolysis.   . Essential hypertension, benign   . Fibrocystic breast disease   . GERD (gastroesophageal reflux disease)   . Gestational hypertension   . Heart murmur   . HFrEF (heart failure with reduced ejection fraction) ([place])    a. 05/2020 Echo: EF <20%, glob HK.  Marland Kitchen History of anemia   . History of blood transfusion   . Hodgkin's lymphoma ([place]) 2011   a. s/p radiation and chemo therapy  . Mixed Ischemic and Nonischemic Cardiomyopathy    a. 05/2020 Echo: EF <20%, glob HK, Nl RV size/fxn, mildly dil LA. Mild MR. Mild to mod Ao sclerosis.  . Osteoarthritis   . Polycystic ovarian disease     Past Surgical History:   Past Surgical History:  Procedure Laterality Date  . ABDOMINAL HYSTERECTOMY    . bladder sling    . CARDIAC CATHETERIZATION  02/2012   [facility] 1 stent place  . CERVICAL POLYPECTOMY    . CHOLECYSTECTOMY  1982  . COLONOSCOPY WITH PROPOFOL N/A 02/05/2015   Procedure: COLONOSCOPY WITH PROPOFOL;  Surgeon: Lucilla Lame, MD;  Location: [facility];  Service: Endoscopy;  Laterality: N/A;  . CORONARY ANGIOPLASTY  02/2012   left/right s/p balloon  . CYSTOGRAM N/A 08/17/2016   Procedure: CYSTOGRAM;  Surgeon: Hollice Espy, MD;  Location: [facility] ORS;  Service: Urology;  Laterality: N/A;  . CYSTOSCOPY N/A 08/17/2016   Procedure: CYSTOSCOPY EXAM UNDER ANESTHESIA;  Surgeon: Hollice Espy, MD;  Location: [facility] OR[facility];  Service: Urology;  Laterality: N/A;  . CYSTOSCOPY W/ RETROGRADES  Bilateral 08/17/2016   Procedure: CYSTOSCOPY WITH RETROGRADE PYELOGRAM;  Surgeon: Hollice Espy, MD;  Location: [facility] OR[facility];  Service: Urology;  Laterality: Bilateral;  . CYSTOSCOPY WITH STENT PLACEMENT Right 08/17/2016   Procedure: CYSTOSCOPY WITH STENT PLACEMENT;  Surgeon: Hollice Espy, MD;  Location: [facility] OR[facility];  Service: Urology;  Laterality: Right;  . heart stent'  2013  . kidney stent Right 2018  . LYMPH NODE BIOPSY  2011   diagnosis of hodgkins lymphoma  . PELVIC LYMPH NODE DISSECTION N/A 07/29/2016   Procedure:  PELVIC/AORTIC LYMPH NODE SAMPLING;  Surgeon: [name], MD;  Location: [facility] OR[facility];  Service: Gynecology;  Laterality: N/A;  . PORTA CATH INSERTION N/A 09/22/2016   Procedure: Glori Luis Cath Insertion;  Surgeon: [name], MD;  Location: [place] [facility];  Service: Cardiovascular;  Laterality: N/A;  . PORTA CATH INSERTION N/A 10/27/2019   Procedure: PORTA CATH INSERTION;  Surgeon: [name], MD;  Location: [place] [facility];  Service: Cardiovascular;  Laterality: N/A;  . PORTA CATH REMOVAL N/A 11/17/2016   Procedure: Glori Luis Cath Removal;  Surgeon: [name], MD;  Location: [place] [facility];  Service: Cardiovascular;  Laterality: N/A;  . RIGHT/LEFT HEART CATH AND CORONARY ANGIOGRAPHY N/A 12/04/2019   Procedure: RIGHT/LEFT HEART CATH AND CORONARY ANGIOGRAPHY;  Surgeon: Wellington Hampshire, MD;  Location: [place] [facility];  Service: Cardiovascular;  Laterality: N/A;  . ROBOTIC ASSISTED TOTAL HYSTERECTOMY WITH BILATERAL SALPINGO OOPHERECTOMY N/A 07/29/2016   Procedure: ROBOTIC ASSISTED TOTAL HYSTERECTOMY WITH BILATERAL SALPINGO OOPHORECTOMY;  Surgeon: [name], MD;  Location: [facility];  Service: Gynecology;  Laterality: N/A;  . SENTINEL NODE BIOPSY N/A 07/29/2016   Procedure: SENTINEL NODE BIOPSY;  Surgeon: [name], MD;  Location: [facility];  Service: Gynecology;  Laterality: N/A;  . transobturator sling N/A 2009   [place]    Family History:  family history includes ALS in her father; Breast cancer in her maternal aunt; Cancer in her maternal aunt and maternal grandfather; Colon cancer in her maternal aunt; Dementia in her mother; Diabetes in her brother; Non-Hodgkin's lymphoma in her cousin; Polymyositis in her father; Stroke in her maternal grandfather and maternal grandmother.  Social History:  Social History   Socioeconomic History  . Marital status: Widowed    Spouse name: Not on file  . Number of children: 2  . Years of  education: 64  . Highest education level: High school graduate  Occupational History  . Occupation: Retired  Tobacco Use  . Smoking status: Former Smoker    Packs/day: 1.00    Years: 30.00    Pack years: 30.00    Types: Cigarettes    Quit date: 07/24/2002    Years since quitting: 18.2  . Smokeless tobacco: Never Used  . Tobacco comment: quit smoking in 2000  Vaping Use  . Vaping Use: Never used  Substance and Sexual Activity  . Alcohol use: Not Currently  . Drug use: No  . Sexual activity: Not Currently  Other Topics Concern  . Not on file  Social History Narrative  . Not on file   Social Determinants of Health   Financial Resource Strain: Medium Risk  . Difficulty of Paying Living Expenses: Somewhat hard  Food Insecurity: No Food Insecurity  . Worried About Charity fundraiser in the Last Year: Never true  . Ran Out of Food in the Last Year: Never true  Transportation Needs: No Transportation Needs  . Lack of Transportation (Medical): No  .  Lack of Transportation (Non-Medical): No  Physical Activity: Unknown  . Days of Exercise per Week: 0 days  . Minutes of Exercise per Session: Not on file  Stress: No Stress Concern Present  . Feeling of Stress : Only a little  Social Connections: Unknown  . Frequency of Communication with Friends and Family: More than three times a week  . Frequency of Social Gatherings with Friends and Family: More than three times a week  . Attends Religious Services: Not on file  . Active Member of Clubs or Organizations: Not on file  . Attends Archivist Meetings: Not on file  . Marital Status: Not on file  Intimate Partner Violence: Not At Risk  . Fear of Current or Ex-Partner: No  . Emotionally Abused: No  . Physically Abused: No  . Sexually Abused: No    Home Medications:  Medications reconciled in EPIC  Current Facility-Administered Medications on File Prior to Encounter  Medication Dose Route Frequency Provider Last Rate  Last Admin  . heparin lock flush 100 unit/mL  500 Units Intravenous Once Sindy Guadeloupe, MD      . sodium chloride flush (NS) 0.9 % injection 10 mL  10 mL Intravenous PRN Sindy Guadeloupe, MD   10 mL at 12/07/19 0914  . sodium chloride flush (NS) 0.9 % injection 10 mL  10 mL Intravenous PRN Sindy Guadeloupe, MD   10 mL at 01/04/20 7253   Current Outpatient Medications on File Prior to Encounter  Medication Sig Dispense Refill  . amiodarone (PACERONE) 200 MG tablet Take 1 tablet (200 mg total) by mouth daily. 30 tablet 1  . cholecalciferol (VITAMIN D3) 25 MCG (1000 UNIT) tablet Take 1,000 Units by mouth in the morning and at bedtime.    Marland Kitchen CRANBERRY PO Take 1 tablet by mouth daily.    . cyanocobalamin (,VITAMIN B-12,) 1000 MCG/ML injection Inject 1 mL (1,000 mcg total) into the muscle every 14 (fourteen) days. 2 mL 11  . ELIQUIS 2.5 MG TABS tablet TAKE 1 TABLET BY MOUTH TWICE A DAY 60 tablet 1  . empagliflozin (JARDIANCE) 10 MG TABS tablet Take 1 tablet (10 mg total) by mouth daily. 30 tablet 5  . ivabradine (CORLANOR) 5 MG TABS tablet Take 1 tablet (5 mg total) by mouth 2 (two) times daily with a meal. 60 tablet 5  . levothyroxine (SYNTHROID) 75 MCG tablet Take 1 tablet (75 mcg total) by mouth daily before breakfast. 30 tablet 2  . LORazepam (ATIVAN) 0.5 MG tablet Take 1 tablet (0.5 mg total) by mouth every 6 (six) hours as needed for anxiety (and to help with breathing). 120 tablet 0  . oxyCODONE (OXY IR/ROXICODONE) 5 MG immediate release tablet Take 1 tablet (5 mg total) by mouth every 4 (four) hours as needed for severe pain. 30 tablet 0  . pantoprazole (PROTONIX) 20 MG tablet Take 1 tablet (20 mg total) by mouth daily. 90 tablet 1  . potassium chloride (KLOR-CON) 10 MEQ tablet Take 1 tablet (10 mEq total) by mouth daily as needed (Take on days that you take the Lasix.). 90 tablet 0  . amitriptyline (ELAVIL) 25 MG tablet Take 1 tablet (25 mg total) by mouth at bedtime. May increase weekly as needed  90 tablet 3  . atorvastatin (LIPITOR) 10 MG tablet Take 1 tablet (10 mg total) by mouth daily. 90 tablet 0  . ciprofloxacin (CIPRO) 250 MG tablet Take 1 tablet (250 mg total) by mouth 2 (two) times daily  for 5 days. 10 tablet 0  . furosemide (LASIX) 20 MG tablet Take 1 tablet (20 mg total) by mouth as needed for fluid or edema. 90 tablet 0  . Morphine Sulfate (MORPHINE CONCENTRATE) 10 mg / 0.5 ml concentrated solution Take 0.25 mLs (5 mg total) by mouth every 6 (six) hours as needed for severe pain or shortness of breath. (Patient not taking: Reported on 09/26/2020) 30 mL 0  . [DISCONTINUED] prochlorperazine (COMPAZINE) 10 MG tablet Take 1 tablet (10 mg total) by mouth every 6 (six) hours as needed (Nausea or vomiting). (Patient taking differently: Take 10 mg by mouth daily as needed for nausea or vomiting. ) 30 tablet 1    Allergies:  Allergies  Allergen Reactions  . Metoprolol Other (See Comments)    Fatigue  . Adhesive [Tape] Other (See Comments)    Skin Irritation   . Antifungal [Miconazole Nitrate] Rash  . Sulfa Antibiotics Nausea And Vomiting and Rash  . Z-Pak [Azithromycin] Itching    Physical Exam:  Temp:  [97.5 F (36.4 C)-98 F (36.7 C)] 98 F (36.7 C) (04/29 0717) Pulse Rate:  [69-80] 80 (04/29 0717) Resp:  [12-20] 18 (04/29 0717) BP: (83-122)/(37-45) 122/44 (04/29 0717) SpO2:  [94 %-100 %] 97 % (04/29 0717) Weight:  [59.9 kg-61.2 kg] 61.2 kg (04/28 2100)   General Appearance:  Well developed, well nourished, no acute distress, alert and oriented x3 HEENT:  Normocephalic atraumatic, extraocular movements intact, moist mucous membranes Abdomen:  Bowel sounds present, fullness infraumbilical and right lower. + TTP . No rebound  Extremities:  Full range of motion, no pedal edema, 2+ distal pulses, no tenderness Skin:  normal coloration and turgor, no rashes, no suspicious skin lesions noted  Neurologic:  Cranial nerves 2-12 grossly intact, normal muscle tone, strength 5/5  all four extremities Psychiatric:  Normal mood and affect, appropriate, no AH/VH Pelvic:  Bimanual exam . Anterior compartment with large smooth bulging mass pushing into lower vagina. 2 cm friable area palpated .  Small blood on glove No speculum exam done .    Labs/Studies:   CBC and Coags:  Lab Results  Component Value Date   WBC 46.4 (H) 10/08/2020   NEUTOPHILPCT 89 09/30/2020   EOSPCT 0 09/19/2020   BASOPCT 1 09/23/2020   LYMPHOPCT 4 09/23/2020   HGB 8.2 (L) 09/21/2020   HCT 26.2 (L) 09/19/2020   MCV 93.2 09/19/2020   PLT 333 09/16/2020   INR 1.5 (H) 09/29/2020   CMP:  Lab Results  Component Value Date   NA 135 10/02/2020   K 5.9 (H) 10/09/2020   CL 105 10/09/2020   CO2 17 (L) 09/15/2020   BUN 27 (H) 09/24/2020   CREATININE 1.92 (H) 10/11/2020   CREATININE 2.31 (H) 09/23/2020   CREATININE 1.70 (H) 08/22/2020   GLU 108 02/15/2019   PROT 6.5 09/26/2020   BILITOT 1.2 10/08/2020   BILIDIR 0.1 04/11/2015   ALT 21 09/15/2020   AST 57 (H) 09/15/2020   ALKPHOS 313 (H) 09/29/2020    Other Imaging: No results found.   Assessment / Plan:   Jaime Hall is a 70 y.o. No obstetric history on file. who presents with vaginal bleeding with known Metastatic endometrial disease . Large tumor burden pelvic pushing into anterior vagina. Friable tissue palpated no identified given no speculum exam was done .   1. Limited options from gyn stand point . Radiation therapy may be an option to control bleeding palliative. IR may be another option to  block vessels feeding this tumor mass .  Pt would most likely be best served at tertiary center , ie [facility] where there is a Sales promotion account executive service( especially given Dr [name] operated on her )      Thank you for the opportunity to be involved with this pt's care.   Laverta Baltimore, MD  Ob/ [facility]  [facility]

## 2020-10-12 ENCOUNTER — Other Ambulatory Visit: Payer: Self-pay | Admitting: Physician Assistant

## 2020-10-12 ENCOUNTER — Inpatient Hospital Stay: Payer: PPO

## 2020-10-12 DIAGNOSIS — N179 Acute kidney failure, unspecified: Secondary | ICD-10-CM

## 2020-10-12 DIAGNOSIS — D62 Acute posthemorrhagic anemia: Secondary | ICD-10-CM | POA: Diagnosis not present

## 2020-10-12 DIAGNOSIS — I420 Dilated cardiomyopathy: Secondary | ICD-10-CM | POA: Diagnosis not present

## 2020-10-12 DIAGNOSIS — I9589 Other hypotension: Secondary | ICD-10-CM

## 2020-10-12 DIAGNOSIS — N939 Abnormal uterine and vaginal bleeding, unspecified: Secondary | ICD-10-CM | POA: Diagnosis not present

## 2020-10-12 DIAGNOSIS — C786 Secondary malignant neoplasm of retroperitoneum and peritoneum: Secondary | ICD-10-CM

## 2020-10-12 DIAGNOSIS — E861 Hypovolemia: Secondary | ICD-10-CM

## 2020-10-12 LAB — CBC WITH DIFFERENTIAL/PLATELET
Abs Immature Granulocytes: 2.59 10*3/uL — ABNORMAL HIGH (ref 0.00–0.07)
Basophils Absolute: 0.1 10*3/uL (ref 0.0–0.1)
Basophils Relative: 0 %
Eosinophils Absolute: 0.1 10*3/uL (ref 0.0–0.5)
Eosinophils Relative: 0 %
HCT: 23.8 % — ABNORMAL LOW (ref 36.0–46.0)
Hemoglobin: 7.8 g/dL — ABNORMAL LOW (ref 12.0–15.0)
Immature Granulocytes: 4 %
Lymphocytes Relative: 1 %
Lymphs Abs: 0.6 10*3/uL — ABNORMAL LOW (ref 0.7–4.0)
MCH: 30.1 pg (ref 26.0–34.0)
MCHC: 32.8 g/dL (ref 30.0–36.0)
MCV: 91.9 fL (ref 80.0–100.0)
Monocytes Absolute: 1.1 10*3/uL — ABNORMAL HIGH (ref 0.1–1.0)
Monocytes Relative: 2 %
Neutro Abs: 55.2 10*3/uL — ABNORMAL HIGH (ref 1.7–7.7)
Neutrophils Relative %: 93 %
Platelets: 280 10*3/uL (ref 150–400)
RBC: 2.59 MIL/uL — ABNORMAL LOW (ref 3.87–5.11)
RDW: 18.9 % — ABNORMAL HIGH (ref 11.5–15.5)
Smear Review: NORMAL
WBC: 59.6 10*3/uL (ref 4.0–10.5)
nRBC: 0 % (ref 0.0–0.2)

## 2020-10-12 LAB — BASIC METABOLIC PANEL
Anion gap: 13 (ref 5–15)
BUN: 39 mg/dL — ABNORMAL HIGH (ref 8–23)
CO2: 16 mmol/L — ABNORMAL LOW (ref 22–32)
Calcium: 8.4 mg/dL — ABNORMAL LOW (ref 8.9–10.3)
Chloride: 107 mmol/L (ref 98–111)
Creatinine, Ser: 3.9 mg/dL — ABNORMAL HIGH (ref 0.44–1.00)
GFR, Estimated: 12 mL/min — ABNORMAL LOW (ref >60)
Glucose, Bld: 102 mg/dL — ABNORMAL HIGH (ref 70–99)
Potassium: 6.4 mmol/L (ref 3.5–5.1)
Sodium: 136 mmol/L (ref 135–145)

## 2020-10-12 LAB — MAGNESIUM: Magnesium: 2.1 mg/dL (ref 1.7–2.4)

## 2020-10-12 LAB — PREPARE RBC (CROSSMATCH)

## 2020-10-12 LAB — GLUCOSE, CAPILLARY
Glucose-Capillary: 60 mg/dL — ABNORMAL LOW (ref 70–99)
Glucose-Capillary: 110 mg/dL — ABNORMAL HIGH (ref 70–99)

## 2020-10-12 MED ORDER — SODIUM BICARBONATE 8.4 % IV SOLN
INTRAVENOUS | Status: DC
  Filled 2020-10-12 (×2): qty 1000

## 2020-10-12 MED ORDER — NOREPINEPHRINE 4 MG/250ML-% IV SOLN
INTRAVENOUS | Status: AC
  Administered 2020-10-12: 5 ug/min via INTRAVENOUS
  Filled 2020-10-12: qty 250

## 2020-10-12 MED ORDER — MIDODRINE HCL 5 MG PO TABS
5.0000 mg | ORAL_TABLET | Freq: Three times a day (TID) | ORAL | Status: DC
  Administered 2020-10-12: 5 mg via ORAL
  Filled 2020-10-12: qty 1

## 2020-10-12 MED ORDER — SODIUM CHLORIDE 0.9% IV SOLUTION: Freq: Once | INTRAVENOUS | Status: DC

## 2020-10-12 MED ORDER — DEXTROSE 50 % IV SOLN
1.0000 | Freq: Once | INTRAVENOUS | Status: AC
  Administered 2020-10-12: 50 mL via INTRAVENOUS
  Filled 2020-10-12: qty 50

## 2020-10-12 MED ORDER — INSULIN ASPART 100 UNIT/ML IV SOLN
10.0000 [IU] | Freq: Once | INTRAVENOUS | Status: AC
  Administered 2020-10-12: 10 [IU] via INTRAVENOUS
  Filled 2020-10-12: qty 0.1

## 2020-10-12 MED ORDER — SODIUM CHLORIDE 0.9 % IV SOLN: INTRAVENOUS | Status: DC

## 2020-10-12 MED ORDER — SODIUM POLYSTYRENE SULFONATE 15 GM/60ML PO SUSP
45.0000 g | Freq: Once | ORAL | Status: AC
  Administered 2020-10-12: 45 g via ORAL
  Filled 2020-10-12: qty 180

## 2020-10-12 MED ORDER — NOREPINEPHRINE 4 MG/250ML-% IV SOLN: 2.0000 ug/min | INTRAVENOUS | Status: DC

## 2020-10-12 MED ORDER — SODIUM CHLORIDE 0.9 % IV BOLUS: 1000.0000 mL | Freq: Once | INTRAVENOUS | Status: DC

## 2020-10-12 MED ORDER — DIPHENHYDRAMINE HCL 25 MG PO CAPS: 25.0000 mg | ORAL_CAPSULE | Freq: Once | ORAL | Status: DC

## 2020-10-12 MED ORDER — SODIUM CHLORIDE 0.9 % IV SOLN: 250.0000 mL | INTRAVENOUS | Status: DC

## 2020-10-12 MED ORDER — SODIUM CHLORIDE 0.9 % IV BOLUS
1000.0000 mL | Freq: Once | INTRAVENOUS | Status: AC
  Administered 2020-10-12: 1000 mL via INTRAVENOUS

## 2020-10-12 MED ORDER — SODIUM BICARBONATE 8.4 % IV SOLN
50.0000 meq | Freq: Once | INTRAVENOUS | Status: AC
  Administered 2020-10-12: 50 meq via INTRAVENOUS
  Filled 2020-10-12: qty 50

## 2020-10-12 MED ORDER — ACETAMINOPHEN 325 MG PO TABS: 650.0000 mg | ORAL_TABLET | Freq: Once | ORAL | Status: DC

## 2020-10-12 MED ORDER — SODIUM ZIRCONIUM CYCLOSILICATE 10 G PO PACK
10.0000 g | PACK | Freq: Four times a day (QID) | ORAL | Status: DC
  Filled 2020-10-12 (×2): qty 1

## 2020-10-13 LAB — TYPE AND SCREEN
ABO/RH(D): O POS
Antibody Screen: NEGATIVE
Unit division: 0
Unit division: 0
Unit division: 0

## 2020-10-13 LAB — BPAM RBC
Blood Product Expiration Date: 202205112359
Blood Product Expiration Date: 202205202359
Blood Product Expiration Date: 202205202359
ISSUE DATE / TIME: 202204290336
Unit Type and Rh: 5100
Unit Type and Rh: 5100
Unit Type and Rh: 9500

## 2020-10-13 LAB — PREPARE RBC (CROSSMATCH)

## 2020-10-13 NOTE — Progress Notes (Addendum)
PROGRESS NOTE    Jaime Hall  [id number] DOB: [birth year] DOA: 10/08/2020 PCP: [name], MD    Chief Complaint  Patient presents with  . Vaginal Bleeding    Brief Narrative:  HPI per Dr. Alethia Berthold Jaime Hall is a 70 y.o. female with medical history significant for metastatic endometrial cancer s/p hysterectomy in 2019 and chemoradiation, with reoccurrence, now on palliative care, CKD 3b, HFrEF with dilated cardiomyopathy, chemotherapy-induced peripheral neuropathy, chronic anticoagulation with Eliquis for history of PE, hypothyroidism, anxiety and chronic pain, who presents to the emergency room with heavy and continuous vaginal bleeding x2 days, worse on standing, requiring hourly changes to sanitary pads.  It was preceded by several week history of light intermittent bleeding.  She denies pain beyond her baseline cancer related pain.  Denies nausea vomiting or fever or change in bowel habits.  Denies lightheadedness, chest pain or palpitations or shortness of breath.  Was advised by her oncologist that if it gets worse she might require an ablation.  ED Course: On arrival, hypotensive at 83/42 with pulse 72 O2 sat 98% on room air.  Blood work significant for hemoglobin of 8, down from 11 a few weeks prior.  Creatinine at baseline at 1.92, mildly elevated potassium at 5.9.  Troponin of 14.  COVID and flu negative EKG as reviewed by me : Normal sinus at 78 with nonspecific ST-T wave changes  Patient was given an IV bolus in the emergency roomWith improvement in BP to 109/45 by admission.  The emergency room provider spoke with OB/GYN, Dr. Ouida Sills who recommended transfer for palliative radiation however per EDP, patient declined transfer opting to stay at this facility.  Hospitalist consulted for admission.   Assessment & Plan:   Active Problems:   Coronary artery disease   Endometrial cancer (HCC)   Chemotherapy-induced peripheral neuropathy (HCC)   Peritoneal  metastases (HCC)   Stage 3b chronic kidney disease (HCC)   Chronic systolic CHF (congestive heart failure) (HCC)   Cardiomyopathy (HCC)   Hypotension   Acute blood loss anemia   Chronic anticoagulation   History of pulmonary embolism   Vaginal bleeding   Hemorrhagic shock (HCC)   Symptomatic anemia  1 symptomatic acute blood loss anemia secondary to vaginal bleeding secondary to recurrent endometrial cancer/chronic anticoagulation on Eliquis with prior history of PE -Patient had presented with ongoing significant vaginal bleeding which has been continuous x2 days. -Patient with history of recurrent metastatic endometrial cancer status post hysterectomy in 2019 with chemoradiation being followed by oncology on palliative care. -Hemoglobin noted on admission down to 8 from 11 a few weeks ago. -Hemoglobin dropped as low as 6.3 (10/04/2020). -Status post transfusion 1 unit packed red blood cells. -Repeat H&H at 7.8 this morning -Patient noted to be hypotensive.[name] with oncology, Dr. Janese Banks who was following.  -Spoke with OB/GYN for formal consult, Dr. Ouida Sills who recommended patient be best served at a tertiary care center for possible palliative radiation as well as starting patient on lysteda as recommended by oncology. -Radiation oncology consulted who assessed the patient. -Vascular surgery consulted and patient underwent uterine embolization 10/06/2020.  -Patient denies any bleeding this morning.   -Patient with persistent hypotension and as such we will transfuse 2 more units of packed red blood cells.   -Palliative care following.   -Oncology, OB/GYN, radiation oncology, vascular surgery following and appreciate their input and recommendations.  Who recommended embolization which will be done today for further management.  2.  Acute on  chronic hypotension secondary to hemorrhagic shock Secondary to problem #1. -Patient transfused 1 unit packed red blood cells hemoglobin  currently at 7.8.   -Patient with ongoing hypotension with systolic blood pressures in the 60s this morning.   -1 L of IV fluids given with no significant improvement with blood pressure.  Patient fatigue.  -Transfusion of 2 units packed red blood cells ordered. -IV fluids changed to bicarb drip. -As blood pressure not responding to fluid bolus we will transfer to the ICU and start Levophed drip. -Discontinue midodrine. -PCCM consulted and are following. -Patient to be transferred to the ICU. -Monitor volume status closely due to poor EF and dilated cardiomyopathy.  3.  Metabolic acidosis/hyperkalemia -Likely secondary to acute on chronic renal failure. -Placed on bicarb drip. -Patient given amp of D50, 10 units of insulin, amp of sodium bicarb this morning. -Patient received Lokelma on admission however potassium currently at 6.4. -Check a EKG. -Kayexalate 45 g p.o. x1. -Repeat labs 2 hours after Kayexalate is given.  4.  Chronic systolic CHF secondary to dilated cardiomyopathy -Currently stable. -Patient with hemorrhagic shock on presentation responding to transfusion and hydration. -Continue to hold furosemide due to hypotension.  -2D echo from from 07/22/2020 with a EF of < 20%. -Amiodarone ordered if patient able to tolerate. -Patient not on beta-blocker or ACE/ARB secondary to hypotension and chronic kidney disease. -Follow.  5.  Coronary artery disease/dilated cardiomyopathy -2D echo from from 07/22/2020 with a EF of < 20%. -Currently on amiodarone.   -Continue to hold diuretics due to hypotension.   -Follow.    6.  Endometrial cancer with peritoneal metastases/chemo induced peripheral neuropathy/chronic cancer related pain -Dr. Janese Banks of oncology consulted and following.. -Continue amitriptyline, home regimen morphine and oxycodone. -Palliative care consultation for goals of care, symptom management.  7.  Acute on chronic chronic kidney disease stage IIIb -Likely secondary  to prerenal azotemia secondary to hemorrhagic shock.  Patient noted to be persistently hypotensive this morning with systolic blood pressures in the 60s.  -Creatinine currently at 3.90 from 2.75 from 1.92 -Check a UA with cultures and sensitivities.  Check a urine sodium, urine creatinine, renal ultrasound. -Place Foley catheter. -Gentle hydration with IV fluids. -If worsening with no improvement may need to consider nephrology evaluation.  8.  History of SVT -Continue home regimen amiodarone if patient able to tolerate orally.  9.  Leukocytosis -Likely secondary to #5.  Check a UA with cultures and sensitivities.  Check a chest x-ray.  Check blood cultures x2.  Flow cytometry ordered per oncology.  Patient afebrile no need for antibiotics at this time.  Oncology following  DVT prophylaxis: TED hose/SCDs Code Status: DNR Family Communication: Updated patient and sister at bedside Disposition:   Status is: Inpatient    Dispo: The patient is from: Home              Anticipated d/c is to: To be determined              Patient currently with ongoing vaginal bleeding secondary to recurrent endometrial cancer, hypotensive, requiring packed red blood cell transfusion, oncology, radiation oncology, vascular surgery consulted.  Patient now hyperkalemic, acidotic, persistent hypotension.  Patient being transferred to the ICU.  Not stable for discharge.   Difficult to place patient no       Consultants:   Oncology: Dr. Janese Banks 09/25/2020  Radiation oncology: Dr. Donella Stade 09/25/2020  Vascular surgery: Marcelle Overlie, PA/Dr. Delana Meyer 10/05/2020  Palliative care: Altha Harm, NP 09/26/2020  PCCM  09/13/2020  Procedures:   Transfusion 1 unit packed red blood cells 10/04/2020  Transfusion of 2 units packed red blood cells pending 10/06/2020 1. Ultrasound guidance for vascular access bilateral femoral arteries 2. Catheter placement into left hypogastric artery third order  branches from right femoral approach and right hypogastric artery third order branches from left femoral approach 3. Selective pelvic arteriograms including selective imaging of both distal hypogastric arteries 4. Micro-bead embolization with 500-700  polyvinyl alcohol beads to the branches of the left hypogastric artery 5. Coil embolization selective branches of the hypogastric artery bilaterally using Ruby coils             6. StarClose closure device bilateral femoral arteries----Per vascular surgery, Dr.Schnier 09/30/2020  Antimicrobials:   None   Subjective: Secure chat sent per rapid response nurse and RN that patient hypotensive with blood pressure of 67/37 with a MAP of 48 with a potassium of 6.4.  10 units of IV insulin ordered as well as D50 and given per RN.  Went to assess patient patient seems fatigued, complains of feeling tired, no chest pain, no shortness of breath, still with some diffuse abdominal pain slightly better than yesterday.  Patient denies any vaginal bleeding this morning.  Sister at bedside feeding patient some pancakes.   Objective: Vitals:   09/25/2020 1823 10/05/2020 2122 09/14/2020 0746 09/23/2020 0803  BP: (!) 90/30 (!) 82/35 (!) 67/37 (!) 82/63  Pulse: 76 76 81 85  Resp: 18 15 16 16   Temp: 97.6 F (36.4 C) 97.7 F (36.5 C) 98.1 F (36.7 C) 98.1 F (36.7 C)  TempSrc:  Oral Oral Oral  SpO2: 97% 91%    Weight:      Height:        Intake/Output Summary (Last 24 hours) at 09/13/2020 0825 Last data filed at 09/26/2020 0600 Gross per 24 hour  Intake 1104.66 ml  Output --  Net 1104.66 ml   Filed Weights   Oct 17, 2020 1753 10-17-20 2100  Weight: 59.9 kg 61.2 kg    Examination:  General exam: Pallor.  Ashen. Respiratory system: Some decreased breath sounds in the bases otherwise clear.  Fair to poor air movement.  No significant use of accessory muscles of respiration.   Cardiovascular system: Regular rate and rhythm  no murmurs rubs or gallops.  No JVD.  No lower extremity edema.  Gastrointestinal system: Abdomen is soft, mildly distended, some diffuse tenderness to palpation.  Positive bowel sounds.  No rebound.  No guarding.   Central nervous system: Alert and somewhat fatigued.  No focal neurological deficits. Extremities: Symmetric 5 x 5 power. Skin: No rashes, lesions or ulcers Psychiatry: Judgement and insight appear fair. Mood & affect appropriate.     Data Reviewed: I have personally reviewed following labs and imaging studies  CBC: Recent Labs  Lab 10/17/20 1809 10/08/2020 0010 09/25/2020 0936 09/24/2020 1233 10/03/2020 0500  WBC 46.4*  --   --  56.2* 59.6*  NEUTROABS 41.5*  --   --  50.8* 55.2*  HGB 8.0* 6.3* 8.2* 8.5* 7.8*  HCT 26.2*  --   --  25.7* 23.8*  MCV 93.2  --   --  88.0 91.9  PLT 333  --   --  319 469    Basic Metabolic Panel: Recent Labs  Lab 10-17-20 1809 09/24/2020 1233 09/23/2020 0500  NA 135 135 136  K 5.9* 5.8* 6.4*  CL 105 106 107  CO2 17* 19* 16*  GLUCOSE 165* 129* 102*  BUN 27* 32*  39*  CREATININE 1.92* 2.75* 3.90*  CALCIUM 9.3 9.0 8.4*  MG  --  2.0 2.1    GFR: Estimated Creatinine Clearance: 11.3 mL/min (A) (by C-G formula based on SCr of 3.9 mg/dL (H)).  Liver Function Tests: Recent Labs  Lab 09/15/2020 1809 09/24/2020 1233  AST 57* 52*  ALT 21 23  ALKPHOS 313* 302*  BILITOT 1.2 1.2  PROT 6.5 5.9*  ALBUMIN 3.0* 2.8*    CBG: Recent Labs  Lab 11/01/2020 0759  GLUCAP 60*     Recent Results (from the past 240 hour(s))  Resp Panel by RT-PCR (Flu A&B, Covid) Nasopharyngeal Swab     Status: None   Collection Time: 09/20/2020  8:03 PM   Specimen: Nasopharyngeal Swab; Nasopharyngeal(NP) swabs in vial transport medium  Result Value Ref Range Status   SARS Coronavirus 2 by RT PCR NEGATIVE NEGATIVE Final    Comment: (NOTE) SARS-CoV-2 target nucleic acids are NOT DETECTED.  The SARS-CoV-2 RNA is generally detectable in upper respiratory specimens during  the acute phase of infection. The lowest concentration of SARS-CoV-2 viral copies this assay can detect is 138 copies/mL. A negative result does not preclude SARS-Cov-2 infection and should not be used as the sole basis for treatment or other patient management decisions. A negative result may occur with  improper specimen collection/handling, submission of specimen other than nasopharyngeal swab, presence of viral mutation(s) within the areas targeted by this assay, and inadequate number of viral copies(<138 copies/mL). A negative result must be combined with clinical observations, patient history, and epidemiological information. The expected result is Negative.  Fact Sheet for Patients:  EntrepreneurPulse.com.au  Fact Sheet for Healthcare Providers:  IncredibleEmployment.be  This test is no t yet approved or cleared by the [place] FDA and  has been authorized for detection and/or diagnosis of SARS-CoV-2 by FDA under an Emergency Use Authorization (EUA). This EUA will remain  in effect (meaning this test can be used) for the duration of the COVID-19 declaration under Section 564(b)(1) of the Act, 21 U.S.C.section 360bbb-3(b)(1), unless the authorization is terminated  or revoked sooner.       Influenza A by PCR NEGATIVE NEGATIVE Final   Influenza B by PCR NEGATIVE NEGATIVE Final    Comment: (NOTE) The Xpert Xpress SARS-CoV-2/FLU/RSV plus assay is intended as an aid in the diagnosis of influenza from Nasopharyngeal swab specimens and should not be used as a sole basis for treatment. Nasal washings and aspirates are unacceptable for Xpert Xpress SARS-CoV-2/FLU/RSV testing.  Fact Sheet for Patients: EntrepreneurPulse.com.au  Fact Sheet for Healthcare Providers: IncredibleEmployment.be  This test is not yet approved or cleared by the [place] FDA and has been authorized for detection and/or  diagnosis of SARS-CoV-2 by FDA under an Emergency Use Authorization (EUA). This EUA will remain in effect (meaning this test can be used) for the duration of the COVID-19 declaration under Section 564(b)(1) of the Act, 21 U.S.C. section 360bbb-3(b)(1), unless the authorization is terminated or revoked.  Performed at [facility], [address] [place], [place] [address]          Radiology Studies: PERIPHERAL VASCULAR CATHETERIZATION  Result Date: 09/13/2020 See op note       Scheduled Meds: . sodium chloride   Intravenous Once  . sodium chloride   Intravenous Once  . acetaminophen  650 mg Oral Once  . amitriptyline  25 mg Oral QHS  . atorvastatin  10 mg Oral Daily  . Chlorhexidine Gluconate Cloth  6 each Topical  Daily  . diphenhydrAMINE  25 mg Oral Once  . feeding supplement  237 mL Oral TID BM  . levothyroxine  75 mcg Oral QAC breakfast  . midodrine  5 mg Oral TID WC  . pantoprazole (PROTONIX) IV  40 mg Intravenous Q24H  . polyethylene glycol  17 g Oral Daily  . senna  1 tablet Oral BID  . sodium chloride flush  3 mL Intravenous Q12H  . sodium zirconium cyclosilicate  5 g Oral BID  . tranexamic acid  1,300 mg Oral TID   Continuous Infusions: . sodium chloride    . sodium chloride 75 mL/hr at 09/20/2020 0650  . sodium chloride       LOS: 2 days    Time spent: 55 minutes    Irine Seal, MD [facility]   To contact the attending provider between 7A-7P or the covering provider during after hours 7P-7A, please log into the web site [email or web address] and access using universal [place] password for that web site. If you do not have the password, please call the hospital operator.  09/25/2020, 8:25 AM

## 2020-10-13 NOTE — Progress Notes (Signed)
   11-06-20 0943  Clinical Encounter Type  Visited With Patient and family together  Visit Type Initial;Spiritual support  Referral From Nurse  Consult/Referral To Chaplain  Spiritual Encounters  Spiritual Needs Emotional;Grief support  Chaplain Curley Hogen responded to a page in ICU room1, Pt Jaime Hall. Pt is at end of life nurse Caryl Pina and three family members are at Pt's bedside. I provided a ministry of presence and emotional support. Pt died during visit, more family members were on their way. I offered my deepest condolences, prayer and grief support.

## 2020-10-13 NOTE — Significant Event (Signed)
Rapid Response Event Note   Reason for Call : Called RRT for hypotension   Initial Focused Assessment: Sitting up in bed, eating pancakes, talking, alert and oriented x 4, no distress, BP low as per monitor- see flowsheets for details.      Interventions: Dr Grandville Silos to bedside 1074ml NS bolus, Insulin and D50 given for K 6.4, 2 units PRBC's ordered, BMP and H&H follow ups. Continue to monitor.  Plan of Care: as above    Event Summary:   MD Notified: Dr Grandville Silos [id number] Call Time:0757 Arrival Time:0759 End HKVQ:[id number]  Darcell Sabino A, RN

## 2020-10-13 NOTE — Consult Note (Signed)
NAME:  Jaime Hall, MRN:  [id number], DOB:  06/08/1951, LOS: 2 ADMISSION DATE:  09/14/2020, CONSULTATION DATE: Oct 29, 2020 REFERRING MD: Dr. Damita Dunnings, CHIEF COMPLAINT: Vaginal bleeding  History of Present Illness:  70 year old female who presents to the [facility] ED with heavy and continuous vaginal bleeding x2 days, requiring hourly changes to sanitary pads.  Per ED documentation that this was preceded by several week history of light intermittent bleeding.  Patient at that time denied pain beyond her baseline cancer related pain.  Denied nausea/vomiting/fever/lightheadedness/chest pain/palpitations or shortness of breath.  The patient was advised by her current oncologist that if this got worse she might require an ablation. ED course: The patient was given an IV fluid bolus with improvement of BP to 109/45.  ED provider spoke with OB/GYN Dr. Ouida Sills who recommended transfer for palliative radiation, however per documentation patient declined transfer opting to stay at this facility and [facility] hospitalists were consulted for admission. Initial vitals: hypotensive with a BP of 83/42 on arrival, HR 72 & SPO2 98% on room air. Significant labs: Acute anemia with a hemoglobin-8 (down from 11-week prior), hyperkalemia at 5.9, troponin 14.  EKG NSR with nonspecific ST-T wave changes. Hospital course:  Vascular surgery performed microbead embolization to the branches of the left hypogastric artery and coil embolization selective branches of the hypogastric artery.  Radiation oncology, palliative oncology, obstetrics, oncology and vascular surgery now all following her case. Overnight 09/25/2020 to Oct 29, 2020 patient's blood pressure dropped with SBP in the 70s and maps in the 30s, [facility] hospitalists ordered IV fluid bolus and PCCM consulted for possible vasopressor need. Pertinent  Medical History  Endometrial cancer PE on Eliquis CAD CKD stage IIIb HFrEF  Significant Hospital Events: Including procedures,  antibiotic start and stop dates in addition to other pertinent events   . 09/30/2020-patient admitted to PCU with vaginal bleeding and hypotension requiring IV fluid bolus. . 10/03/2020-microbead embolization to the branches of the left hypogastric artery & coil embolization selective branches of the hypogastric artery bilaterally using Ruby coils . Oct 29, 2020-PCCM consulted due to hypotension overnight, IV fluid bolus ordered by [facility] hospitalist with improved BP  Interim History / Subjective:  Patient resting, comfortable appearing in bed.  Overall complaint of weakness, but patient denies dizziness/shortness of breath/chest pain/bleeding.  Care RN bedside confirms no signs or symptoms of bleeding.  IV fluid bolus running most recent BP 90/60 with a MAP of 77 and heart rate stable at 97.  Objective   Blood pressure (!) 82/35, pulse 76, temperature 97.7 F (36.5 C), temperature source Oral, resp. rate 15, height 5' 1.75" (1.568 m), weight 61.2 kg, SpO2 91 %.        Intake/Output Summary (Last 24 hours) at Oct 29, 2020 0544 Last data filed at 10/05/2020 2300 Gross per 24 hour  Intake 745.69 ml  Output --  Net 745.69 ml   Filed Weights   09/30/2020 1753 09/21/2020 2100  Weight: 59.9 kg 61.2 kg    Examination: General: Adult female, critically/chronically ill, lying in bed, NAD HEENT: MM pale/dry, anicteric, atraumatic, neck supple Neuro: A&O x 4, able to follow commands, PERRL +3, MAE, generalized weakness CV: s1s2 RRR, NSR on monitor, no r/m/g Pulm: Regular, non labored on room air, breath sounds clear throughout GI: soft, rounded, non tender, bs x 4 Skin: Limited exam- no rashes/lesions noted Extremities: warm/dry, pulses + 2 R/P, no edema noted  Labs/imaging that I have personally reviewed  (right click and "Reselect all SmartList Selections" daily)  Net: +300 mL (+985  since admit) Na+/ K+: 135/5.8 BUN/Cr.:  32/2.75 Serum CO2/ AG: 19/10  Hgb: 8.5  WBC/ TMAX: 56.2/afebrile Resolved  Hospital Problem list     Assessment & Plan:  Hypotension in the setting of hypovolemia due to poor p.o. take/dehydration versus acute hemorrhagic shock Patient received 1 L NS bolus with improvement of BP.  Per care nurse no bleeding episodes overnight and patient has had very poor p.o. intake.  Patient has also received quite a bit of pain management overnight and that could be adding to hypotension discussed with Dr. Damita Dunnings- we will hold off on vasopressors for now, continue to monitor BP while receiving fluid hydration. -Gentle IV fluid hydration: 75 mL an hour, in the setting of HFrEF monitor for any signs of fluid volume overload -Stat a.m. labs, if hemoglobin less than 7 transfuse -Continue to monitor for signs and symptoms of bleeding -Daily CBC -Start midodrine -Palliative care, oncology, obstetrics all following-appreciate input  Best practice (right click and "Reselect all SmartList Selections" daily)  Diet:  NPO Pain/Anxiety/Delirium protocol (if indicated): No VAP protocol (if indicated): Not indicated DVT prophylaxis: Contraindicated GI prophylaxis: N/A Glucose control:  SSI No Central venous access:  N/A Arterial line:  N/A Foley:  N/A Mobility:  bed rest  PT consulted: N/A Last date of multidisciplinary goals of care discussion 10/20/2020 Code Status:  DNR Disposition: PCU  Labs   CBC: Recent Labs  Lab 09/26/2020 1809 10-20-2020 0010 10/20/20 0936 10-20-20 1233 09/26/2020 0500  WBC 46.4*  --   --  56.2* PENDING  NEUTROABS 41.5*  --   --  50.8* PENDING  HGB 8.0* 6.3* 8.2* 8.5* 7.8*  HCT 26.2*  --   --  25.7* 23.8*  MCV 93.2  --   --  88.0 91.9  PLT 333  --   --  319 664    Basic Metabolic Panel: Recent Labs  Lab 09/16/2020 1809 2020-10-20 1233  NA 135 135  K 5.9* 5.8*  CL 105 106  CO2 17* 19*  GLUCOSE 165* 129*  BUN 27* 32*  CREATININE 1.92* 2.75*  CALCIUM 9.3 9.0  MG  --  2.0   GFR: Estimated Creatinine Clearance: 16.1 mL/min (A) (by C-G formula based  on SCr of 2.75 mg/dL (H)). Recent Labs  Lab 09/30/2020 1809 Oct 20, 2020 1233 09/19/2020 0500  WBC 46.4* 56.2* PENDING    Liver Function Tests: Recent Labs  Lab 09/25/2020 1809 Oct 20, 2020 1233  AST 57* 52*  ALT 21 23  ALKPHOS 313* 302*  BILITOT 1.2 1.2  PROT 6.5 5.9*  ALBUMIN 3.0* 2.8*   No results for input(s): LIPASE, AMYLASE in the last 168 hours. No results for input(s): AMMONIA in the last 168 hours.  ABG    Component Value Date/Time   HCO3 28.9 (H) 02/19/2020 2125   O2SAT 61.4 02/19/2020 2125     Coagulation Profile: Recent Labs  Lab 09/15/2020 1834  INR 1.5*    Cardiac Enzymes: No results for input(s): CKTOTAL, CKMB, CKMBINDEX, TROPONINI in the last 168 hours.  HbA1C: Hemoglobin A1C  Date/Time Value Ref Range Status  08/08/2018 12:00 AM 14.1  Final   Hgb A1c MFr Bld  Date/Time Value Ref Range Status  12/30/2019 04:57 AM 6.1 (H) 4.8 - 5.6 % Final    Comment:    (NOTE) Pre diabetes:          5.7%-6.4%  Diabetes:              >6.4%  Glycemic control for   <7.0%  adults with diabetes   07/27/2019 02:29 PM 6.0 4.6 - 6.5 % Final    Comment:    Glycemic Control Guidelines for People with Diabetes:Non Diabetic:  <6%Goal of Therapy: <7%Additional Action Suggested:  >8%     CBG: No results for input(s): GLUCAP in the last 168 hours.  Review of Systems: Positives in bold  Gen: Denies fever, chills, weight change, fatigue, night sweats HEENT: Denies blurred vision, double vision, hearing loss, tinnitus, sinus congestion, rhinorrhea, sore throat, neck stiffness, dysphagia PULM: Denies shortness of breath, cough, sputum production, hemoptysis, wheezing CV: Denies chest pain, edema, orthopnea, paroxysmal nocturnal dyspnea, palpitations GI: Denies abdominal pain, nausea, vomiting, diarrhea, hematochezia, melena, constipation, change in bowel habits GU: Denies dysuria, hematuria, polyuria, oliguria, urethral discharge Endocrine: Denies hot or cold intolerance,  polyuria, polyphagia or appetite change Derm: Denies rash, dry skin, scaling or peeling skin change Heme: Denies easy bruising, bleeding, bleeding gums Neuro: Denies headache, numbness, weakness, slurred speech, loss of memory or consciousness Past Medical History:  She,  has a past medical history of Anxiety, Cervicalgia, Chronic kidney disease, Coronary artery disease, Endometrial cancer ([place]), Essential hypertension, benign, Fibrocystic breast disease, GERD (gastroesophageal reflux disease), Gestational hypertension, Heart murmur, HFrEF (heart failure with reduced ejection fraction) ([place]), History of anemia, History of blood transfusion, Hodgkin's lymphoma ([place]) (2011), Mixed Ischemic and Nonischemic Cardiomyopathy, Osteoarthritis, and Polycystic ovarian disease.   Surgical History:   Past Surgical History:  Procedure Laterality Date  . ABDOMINAL HYSTERECTOMY    . bladder sling    . CARDIAC CATHETERIZATION  02/2012   [facility] 1 stent place  . CERVICAL POLYPECTOMY    . CHOLECYSTECTOMY  1982  . COLONOSCOPY WITH PROPOFOL N/A 02/05/2015   Procedure: COLONOSCOPY WITH PROPOFOL;  Surgeon: Lucilla Lame, MD;  Location: [facility] ENDOSCOPY;  Service: Endoscopy;  Laterality: N/A;  . CORONARY ANGIOPLASTY  02/2012   left/right s/p balloon  . CYSTOGRAM N/A 08/17/2016   Procedure: CYSTOGRAM;  Surgeon: Hollice Espy, MD;  Location: [facility] ORS;  Service: Urology;  Laterality: N/A;  . CYSTOSCOPY N/A 08/17/2016   Procedure: CYSTOSCOPY EXAM UNDER ANESTHESIA;  Surgeon: Hollice Espy, MD;  Location: [facility] ORS;  Service: Urology;  Laterality: N/A;  . CYSTOSCOPY W/ RETROGRADES Bilateral 08/17/2016   Procedure: CYSTOSCOPY WITH RETROGRADE PYELOGRAM;  Surgeon: Hollice Espy, MD;  Location: [facility] ORS;  Service: Urology;  Laterality: Bilateral;  . CYSTOSCOPY WITH STENT PLACEMENT Right 08/17/2016   Procedure: CYSTOSCOPY WITH STENT PLACEMENT;  Surgeon: Hollice Espy, MD;  Location: [facility] ORS;  Service: Urology;  Laterality: Right;  .  heart stent'  2013  . kidney stent Right 2018  . LYMPH NODE BIOPSY  2011   diagnosis of hodgkins lymphoma  . PELVIC LYMPH NODE DISSECTION N/A 07/29/2016   Procedure: PELVIC/AORTIC LYMPH NODE SAMPLING;  Surgeon: [name], MD;  Location: [facility] ORS;  Service: Gynecology;  Laterality: N/A;  . PORTA CATH INSERTION N/A 09/22/2016   Procedure: Glori Luis Cath Insertion;  Surgeon: [name], MD;  Location: [place] CV LAB;  Service: Cardiovascular;  Laterality: N/A;  . PORTA CATH INSERTION N/A 10/27/2019   Procedure: PORTA CATH INSERTION;  Surgeon: [name], MD;  Location: [place] CV LAB;  Service: Cardiovascular;  Laterality: N/A;  . PORTA CATH REMOVAL N/A 11/17/2016   Procedure: Glori Luis Cath Removal;  Surgeon: [name], MD;  Location: [place] CV LAB;  Service: Cardiovascular;  Laterality: N/A;  . RIGHT/LEFT HEART CATH AND CORONARY ANGIOGRAPHY N/A 12/04/2019   Procedure: RIGHT/LEFT HEART CATH AND  CORONARY ANGIOGRAPHY;  Surgeon: Wellington Hampshire, MD;  Location: [place] CV LAB;  Service: Cardiovascular;  Laterality: N/A;  . ROBOTIC ASSISTED TOTAL HYSTERECTOMY WITH BILATERAL SALPINGO OOPHERECTOMY N/A 07/29/2016   Procedure: ROBOTIC ASSISTED TOTAL HYSTERECTOMY WITH BILATERAL SALPINGO OOPHORECTOMY;  Surgeon: [name], MD;  Location: [facility] ORS;  Service: Gynecology;  Laterality: N/A;  . SENTINEL NODE BIOPSY N/A 07/29/2016   Procedure: SENTINEL NODE BIOPSY;  Surgeon: [name], MD;  Location: [facility] ORS;  Service: Gynecology;  Laterality: N/A;  . transobturator sling N/A 2009   [place]     Social History:   reports that she quit smoking about 18 years ago. Her smoking use included cigarettes. She has a 30.00 pack-year smoking history. She has never used smokeless tobacco. She reports previous alcohol use. She reports that she does not use drugs.   Family History:  Her family history includes ALS in her father; Breast cancer in  her maternal aunt; Cancer in her maternal aunt and maternal grandfather; Colon cancer in her maternal aunt; Dementia in her mother; Diabetes in her brother; Non-Hodgkin's lymphoma in her cousin; Polymyositis in her father; Stroke in her maternal grandfather and maternal grandmother.   Allergies Allergies  Allergen Reactions  . Metoprolol Other (See Comments)    Fatigue  . Adhesive [Tape] Other (See Comments)    Skin Irritation   . Antifungal [Miconazole Nitrate] Rash  . Sulfa Antibiotics Nausea And Vomiting and Rash  . Z-Pak [Azithromycin] Itching     Home Medications  Prior to Admission medications   Medication Sig Start Date End Date Taking? Authorizing Provider  amiodarone (PACERONE) 200 MG tablet Take 1 tablet (200 mg total) by mouth daily. 07/31/20  Yes Dunn, Areta Haber, PA-C  cholecalciferol (VITAMIN D3) 25 MCG (1000 UNIT) tablet Take 1,000 Units by mouth in the morning and at bedtime.   Yes [provider]  CRANBERRY PO Take 1 tablet by mouth daily.   Yes [provider]  cyanocobalamin (,VITAMIN B-12,) 1000 MCG/ML injection Inject 1 mL (1,000 mcg total) into the muscle every 14 (fourteen) days. 07/26/20  Yes [name], MD  ELIQUIS 2.5 MG TABS tablet TAKE 1 TABLET BY MOUTH TWICE A DAY 10/07/20  Yes Sindy Guadeloupe, MD  empagliflozin (JARDIANCE) 10 MG TABS tablet Take 1 tablet (10 mg total) by mouth daily. 07/16/20  Yes Loel Dubonnet, NP  ivabradine (CORLANOR) 5 MG TABS tablet Take 1 tablet (5 mg total) by mouth 2 (two) times daily with a meal. 07/16/20  Yes Loel Dubonnet, NP  levothyroxine (SYNTHROID) 75 MCG tablet Take 1 tablet (75 mcg total) by mouth daily before breakfast. 07/15/20  Yes Sindy Guadeloupe, MD  LORazepam (ATIVAN) 0.5 MG tablet Take 1 tablet (0.5 mg total) by mouth every 6 (six) hours as needed for anxiety (and to help with breathing). 02/01/20  Yes Sindy Guadeloupe, MD  oxyCODONE (OXY IR/ROXICODONE) 5 MG immediate release tablet Take 1 tablet (5 mg  total) by mouth every 4 (four) hours as needed for severe pain. 10/09/20  Yes Sindy Guadeloupe, MD  pantoprazole (PROTONIX) 20 MG tablet Take 1 tablet (20 mg total) by mouth daily. 07/26/20  Yes [name], MD  potassium chloride (KLOR-CON) 10 MEQ tablet Take 1 tablet (10 mEq total) by mouth daily as needed (Take on days that you take the Lasix.). 07/23/20  Yes Wellington Hampshire, MD  amitriptyline (ELAVIL) 25 MG tablet Take 1 tablet (25 mg total) by mouth  at bedtime. May increase weekly as needed 04/02/20   [name], MD  atorvastatin (LIPITOR) 10 MG tablet Take 1 tablet (10 mg total) by mouth daily. 07/23/20   Wellington Hampshire, MD  ciprofloxacin (CIPRO) 250 MG tablet Take 1 tablet (250 mg total) by mouth 2 (two) times daily for 5 days. 10/03/2020 10/15/20  Sindy Guadeloupe, MD  furosemide (LASIX) 20 MG tablet Take 1 tablet (20 mg total) by mouth as needed for fluid or edema. 07/23/20   Wellington Hampshire, MD  Morphine Sulfate (MORPHINE CONCENTRATE) 10 mg / 0.5 ml concentrated solution Take 0.25 mLs (5 mg total) by mouth every 6 (six) hours as needed for severe pain or shortness of breath. Patient not taking: Reported on 09/21/2020 02/27/20   Sindy Guadeloupe, MD  prochlorperazine (COMPAZINE) 10 MG tablet Take 1 tablet (10 mg total) by mouth every 6 (six) hours as needed (Nausea or vomiting). Patient taking differently: Take 10 mg by mouth daily as needed for nausea or vomiting.  10/25/19 05/15/20  Sindy Guadeloupe, MD     Critical care time: 38 minutes       Venetia Night, AGACNP-BC Acute Care Nurse Practitioner [place]   [phone] / [phone] Please see Amion for pager details.

## 2020-10-13 NOTE — Progress Notes (Signed)
Patient transferred to ICU for levo gtt initiation for ongoing hypotension. Per handoff, patient had been sitting up in bed eating breakfast without any distress noted when she was found to be hypotensive. Immediately prior to arrival to ICU, patient aspirated during foley insertion on floor. Patient unresponsive on arrival, vomitus noted around patient mouth with agonal respirations, SB with rate in the 40-50s and SBP <70. Levo gtt started but patient progressively become more bradycardic eventually progressing to asystole. Family at bedside comforting patient, chaplain at bedside for support. Time of death: [phone]

## 2020-10-13 NOTE — Death Summary Note (Signed)
DEATH SUMMARY   Patient Details  Name: Jaime Hall MRN: [id number] DOB: [birth year]  Admission/Discharge Information   Admit Date:  10/08/2020  Date of Death: Date of Death: Oct 14, 2020  Time of Death: Time of Death: 0953  Length of Stay: 2  Referring Physician: [name], MD   Reason(s) for Hospitalization  Symptomatic acute blood loss anemia secondary to vaginal bleeding secondary to recurrent endometrial cancer.  Diagnoses  Preliminary cause of death:  Secondary Diagnoses (including complications and co-morbidities):  Active Problems:   Coronary artery disease   Endometrial cancer (HCC)   Chemotherapy-induced peripheral neuropathy (HCC)   Peritoneal metastases (HCC)   Stage 3b chronic kidney disease (HCC)   Chronic systolic CHF (congestive heart failure) (HCC)   Cardiomyopathy (HCC)   Hypotension   Acute blood loss anemia   Chronic anticoagulation   History of pulmonary embolism   Vaginal bleeding   Hemorrhagic shock (HCC)   Symptomatic anemia   ARF (acute renal failure) [facility])   Brief Hospital Course (including significant findings, care, treatment, and services provided and events leading to death)  Jaime Hall is a 70 y.o. year old female who had medical history of recurrent metastatic endometrial cancer, chronic kidney disease stage IIIb, dilated cardiomyopathy who presented to the ED with continuous vaginal bleeding x2 days.  Patient noted on arrival to be hypotensive with blood pressure of 83/42, hemoglobin down to 8.  Patient given some fluid boluses with some initial improvement with blood pressure.  OB/GYN, palliative radiation, oncology, vascular surgery consulted.  1 symptomatic acute blood loss anemia secondary to vaginal bleeding secondary to recurrent endometrial cancer/chronic anticoagulation on Eliquis with prior history of PE -Patient had presented with ongoing significant vaginal bleeding which has been continuous x2 days. -Patient with  history of recurrent metastatic endometrial cancer status post hysterectomy in 2017-09-17 with chemoradiation being followed by oncology on palliative care. -Hemoglobin noted on admission down to 8 from 11 a few weeks ago. -Hemoglobin dropped as low as 6.3 (10/09/2020). -Status post transfusion 1 unit packed red blood cells. -Repeat H&H at 7.8 on 71-May-2022. -Patient noted to be hypotensive.Jaime Hall with oncology, Dr. Janese Hall who was following.  -Spoke with OB/GYN for formal consult, Dr. Ouida Hall who recommended patient be best served at a tertiary care center for possible palliative radiation as well as starting patient on lysteda as recommended by oncology. -Radiation oncology consulted who assessed the patient. -Vascular surgery consulted and patient underwent uterine embolization 10/07/2020.  -Patient denied any bleeding the morning of 2020-10-14..   -Patient with persistent hypotension and as such 2 units of packed red blood cells were ordered to be transfused. -Palliative care following.   -Oncology, OB/GYN, radiation oncology, vascular surgery, PCCM following. -Patient continued to deteriorate remained hypotensive despite fluid boluses and transferred to the ICU.  Pressors/Levophed was ordered to be started however patient subsequently died at 0953 hrs.   2.  Acute on chronic hypotension secondary to hemorrhagic shock Secondary to problem #1. -Patient transfused 1 unit packed red blood cells hemoglobin noted to persist at 7.8.   -Patient with ongoing hypotension with systolic blood pressures in the 60s . -1 L of IV fluids given with no significant improvement with blood pressure.  Patient fatigue.  -Transfusion of 2 units packed red blood cells ordered. -IV fluids changed to bicarb drip. -As blood pressure not responding to fluid bolus patient was transferred to the ICU and lieu of Levophed drip/(order to be started. -Midodrine discontinued -PCCM consulted on the patient  and wife  following. -Patient to be transferred to the ICU. -Due to persistent hypotension patient transferred to the ICU, patient continued to deteriorate and patient subsequently died at 0953 hrs.   3.  Metabolic acidosis/hyperkalemia -Likely secondary to acute on chronic renal failure. -Placed on bicarb drip. -Patient given amp of D50, 10 units of insulin, amp of sodium bicarb this morning. -Patient received Lokelma on admission however potassium remains at 6.4. -Kayexalate 45 g p.o. x1. -Patient's condition deteriorated with persistent hypotension, was transferred to the ICU, pressors ordered to be started however patient subsequently died at 0953 hrs.  4.  Chronic systolic CHF secondary to dilated cardiomyopathy -Patient with hemorrhagic shock on presentation responding to transfusion and hydration. -Patient's furosemide was held due to hypotension.   -2D echo from 07/22/2020 with a EF of < 20%. -Amiodarone ordered if patient able to tolerate. -Patient not on beta-blocker or ACE/ARB secondary to hypotension and chronic kidney disease. -Patient's condition deteriorated with persistent shock subsequently transferred to the ICU to be started on pressors when patient subsequently died at 0953 hrs.  5.  Coronary artery disease/dilated cardiomyopathy -2D echo from from 07/22/2020 with a EF of < 20%. -Patient maintained on home regimen amiodarone.  Diuretics were held due to ongoing hypotension.     6.  Endometrial cancer with peritoneal metastases/chemo induced peripheral neuropathy/chronic cancer related pain -Dr. Janese Hall of oncology consulted and followed the patient during the hospitalization... -Palliative care was also consulted for symptom management and pain control.   7.  Acute on chronic chronic kidney disease stage IIIb -Likely secondary to prerenal azotemia secondary to hemorrhagic shock.  Patient noted to be persistently hypotensive during the hospitalization.   -Patient noted to have a  worsening renal function with creatinine climbing as high as 3.90 from 1.92. -Urinalysis and urine electrolytes were ordered.  Foley catheter was ordered to be placed. -Patient worsened/deteriorated with ongoing hypotension likely felt secondary to hemorrhagic shock.  Patient transferred to the ICU, pressors ordered however patient subsequently died at 0953 hrs.  8.  History of SVT -Patient maintained on home regimen of amiodarone.    9.  Leukocytosis -Likely secondary to #5.    Urinalysis was pending, chest x-ray was pending, blood cultures ordered.  Patient remained afebrile.  No antibiotics were needed.   Patient deteriorated subsequently got transferred to the ICU to be started on pressors when patient subsequently was pronounced dead at 0953 hrs.      Pertinent Labs and Studies  Significant Diagnostic Studies PERIPHERAL VASCULAR CATHETERIZATION  Result Date: 09/26/2020 See op note   Microbiology Recent Results (from the past 240 hour(s))  Resp Panel by RT-PCR (Flu A&B, Covid) Nasopharyngeal Swab     Status: None   Collection Time: 09/15/2020  8:03 PM   Specimen: Nasopharyngeal Swab; Nasopharyngeal(NP) swabs in vial transport medium  Result Value Ref Range Status   SARS Coronavirus 2 by RT PCR NEGATIVE NEGATIVE Final    Comment: (NOTE) SARS-CoV-2 target nucleic acids are NOT DETECTED.  The SARS-CoV-2 RNA is generally detectable in upper respiratory specimens during the acute phase of infection. The lowest concentration of SARS-CoV-2 viral copies this assay can detect is 138 copies/mL. A negative result does not preclude SARS-Cov-2 infection and should not be used as the sole basis for treatment or other patient management decisions. A negative result may occur with  improper specimen collection/handling, submission of specimen other than nasopharyngeal swab, presence of viral mutation(s) within the areas targeted by this assay, and inadequate number  of viral copies(<138  copies/mL). A negative result must be combined with clinical observations, patient history, and epidemiological information. The expected result is Negative.  Fact Sheet for Patients:  EntrepreneurPulse.com.au  Fact Sheet for Healthcare Providers:  IncredibleEmployment.be  This test is no t yet approved or cleared by the [place] FDA and  has been authorized for detection and/or diagnosis of SARS-CoV-2 by FDA under an Emergency Use Authorization (EUA). This EUA will remain  in effect (meaning this test can be used) for the duration of the COVID-19 declaration under Section 564(b)(1) of the Act, 21 U.S.C.section 360bbb-3(b)(1), unless the authorization is terminated  or revoked sooner.       Influenza A by PCR NEGATIVE NEGATIVE Final   Influenza B by PCR NEGATIVE NEGATIVE Final    Comment: (NOTE) The Xpert Xpress SARS-CoV-2/FLU/RSV plus assay is intended as an aid in the diagnosis of influenza from Nasopharyngeal swab specimens and should not be used as a sole basis for treatment. Nasal washings and aspirates are unacceptable for Xpert Xpress SARS-CoV-2/FLU/RSV testing.  Fact Sheet for Patients: EntrepreneurPulse.com.au  Fact Sheet for Healthcare Providers: IncredibleEmployment.be  This test is not yet approved or cleared by the [place] FDA and has been authorized for detection and/or diagnosis of SARS-CoV-2 by FDA under an Emergency Use Authorization (EUA). This EUA will remain in effect (meaning this test can be used) for the duration of the COVID-19 declaration under Section 564(b)(1) of the Act, 21 U.S.C. section 360bbb-3(b)(1), unless the authorization is terminated or revoked.  Performed at [facility], [address] [place], [place] [address]     Lab Basic Metabolic Panel: Recent Labs  Lab 10/18/2020 1809 10/08/2020 1233 09/20/2020 0500  NA 135 135 136  K 5.9* 5.8*  6.4*  CL 105 106 107  CO2 17* 19* 16*  GLUCOSE 165* 129* 102*  BUN 27* 32* 39*  CREATININE 1.92* 2.75* 3.90*  CALCIUM 9.3 9.0 8.4*  MG  --  2.0 2.1   Liver Function Tests: Recent Labs  Lab 10/18/20 1809 10/01/2020 1233  AST 57* 52*  ALT 21 23  ALKPHOS 313* 302*  BILITOT 1.2 1.2  PROT 6.5 5.9*  ALBUMIN 3.0* 2.8*   No results for input(s): LIPASE, AMYLASE in the last 168 hours. No results for input(s): AMMONIA in the last 168 hours. CBC: Recent Labs  Lab Oct 18, 2020 1809 10/04/2020 0010 09/24/2020 0936 10/08/2020 1233 09/18/2020 0500  WBC 46.4*  --   --  56.2* 59.6*  NEUTROABS 41.5*  --   --  50.8* 55.2*  HGB 8.0* 6.3* 8.2* 8.5* 7.8*  HCT 26.2*  --   --  25.7* 23.8*  MCV 93.2  --   --  88.0 91.9  PLT 333  --   --  319 280   Cardiac Enzymes: No results for input(s): CKTOTAL, CKMB, CKMBINDEX, TROPONINI in the last 168 hours. Sepsis Labs: Recent Labs  Lab 2020-10-18 1809 10/01/2020 1233 10/03/2020 0500  WBC 46.4* 56.2* 59.6*    Procedures/Operations    Transfusion 1 unit packed red blood cells 09/15/2020  Transfusion of 2 units packed red blood cells 10/09/2020 1. Ultrasound guidance for vascular access bilateral femoral arteries 2.Catheter placement into left hypogastricartery third order branchesfrom right femoral approach and righthypogastricartery third order branchesfrom left femoral approach 3. Selective pelvic arteriograms including selective imaging of bothdistal hypogastricarteries 4. Micro-bead embolization with500-700polyvinyl alcohol beads to the branches of the left hypogastricartery 5. Coil embolization selective branches of the hypogastric artery bilaterally using Ruby coils  6.StarClose closure device bilateral femoral arteries----Per vascular surgery, Dr.Schnier 09/15/2020   Irine Seal 25-Oct-2020, 10:29 AM

## 2020-10-13 DEATH — deceased

## 2020-10-14 ENCOUNTER — Encounter: Payer: Self-pay | Admitting: Vascular Surgery

## 2020-10-14 ENCOUNTER — Ambulatory Visit: Payer: PPO

## 2020-10-14 LAB — PATHOLOGIST SMEAR REVIEW

## 2020-10-15 ENCOUNTER — Ambulatory Visit: Payer: Self-pay | Admitting: *Deleted

## 2020-10-15 ENCOUNTER — Ambulatory Visit: Admit: 2020-10-15 | Payer: PPO

## 2020-10-15 ENCOUNTER — Ambulatory Visit: Payer: PPO

## 2020-10-15 DIAGNOSIS — C541 Malignant neoplasm of endometrium: Secondary | ICD-10-CM

## 2020-10-15 NOTE — Chronic Care Management (AMB) (Signed)
  Chronic Care Management   Outreach Note  10/15/2020 Name: Jaime Hall MRN: [id number] DOB: Jul 20, 1950  Referred by: [name], MD Reason for referral : Chronic Care Management (Case Closure)   Patient was engaged with Chronic Care Management services. It is noted in the EHR that the patient is deceased as of 2020-11-10 (date). The primary care provider has been notified of this information. No further outreach on the part of the embedded care management team will be made.  Case being closed at this time.  Follow Up Plan: No further follow up required: as patient is expired.  Hubert Azure RN, MSN RN Care Management Coordinator [place] [phone] [email or web address]

## 2020-10-16 ENCOUNTER — Ambulatory Visit: Payer: PPO

## 2020-10-17 ENCOUNTER — Other Ambulatory Visit: Payer: PPO | Admitting: Adult Health Nurse Practitioner

## 2020-10-17 ENCOUNTER — Ambulatory Visit: Payer: PPO

## 2020-10-18 ENCOUNTER — Ambulatory Visit: Payer: PPO

## 2020-10-21 ENCOUNTER — Ambulatory Visit: Payer: PPO

## 2020-10-22 ENCOUNTER — Ambulatory Visit: Payer: PPO

## 2020-10-23 ENCOUNTER — Ambulatory Visit: Payer: PPO

## 2020-10-24 ENCOUNTER — Ambulatory Visit: Payer: PPO

## 2020-10-24 ENCOUNTER — Ambulatory Visit: Payer: PPO | Admitting: Oncology

## 2020-10-24 ENCOUNTER — Other Ambulatory Visit: Payer: PPO

## 2020-10-25 ENCOUNTER — Ambulatory Visit: Payer: PPO

## 2020-10-30 ENCOUNTER — Telehealth: Payer: PPO

## 2020-10-31 ENCOUNTER — Ambulatory Visit: Payer: PPO | Admitting: Cardiovascular Disease

## 2021-06-26 ENCOUNTER — Ambulatory Visit: Payer: PPO

## 2021-11-26 IMAGING — CT CT BIOPSY
1 of 2 series · 15 of 32 positions shown, 19 images · non-contrast
Comparison: none

CLINICAL DATA: History of endometrial carcinoma development
multiple peritoneal masses suspicious for recurrent metastatic
disease.

[Series 2: i-spiral 5.0 b30f · axial · 0.73mm/px · z∈[-336,-196]mm · 15 of 44 slices shown, 19 images]
[im 2/44  soft-tissue]
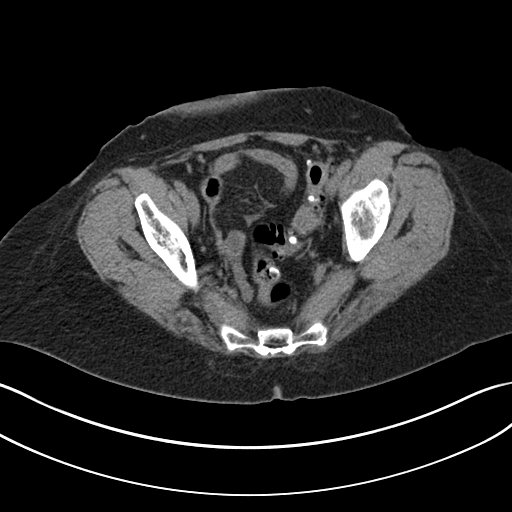
[im 2/44  bone]
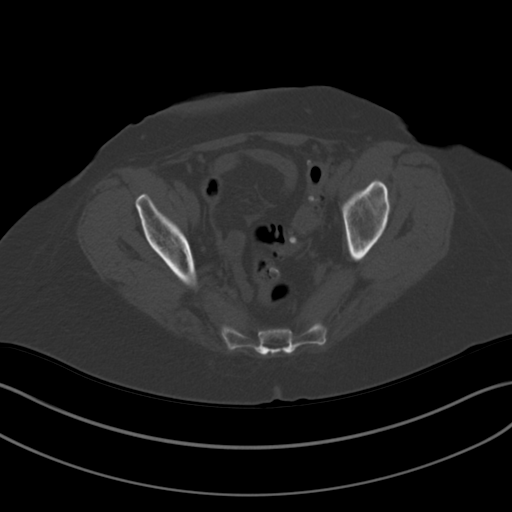
[im 6/44  soft-tissue]
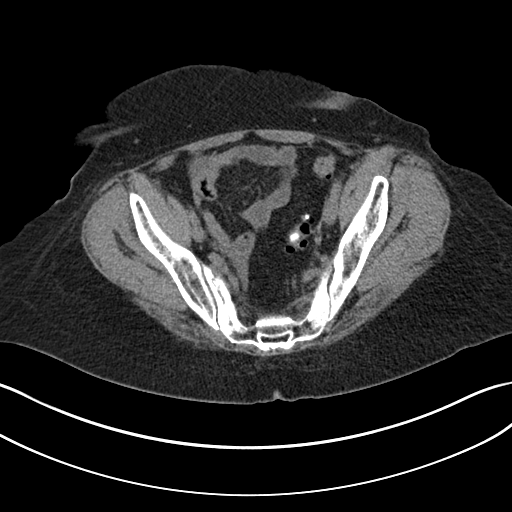
[im 10/44  soft-tissue]
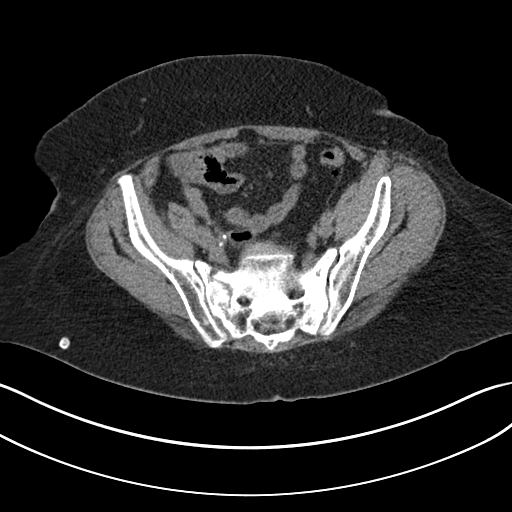
[im 12/44  soft-tissue]
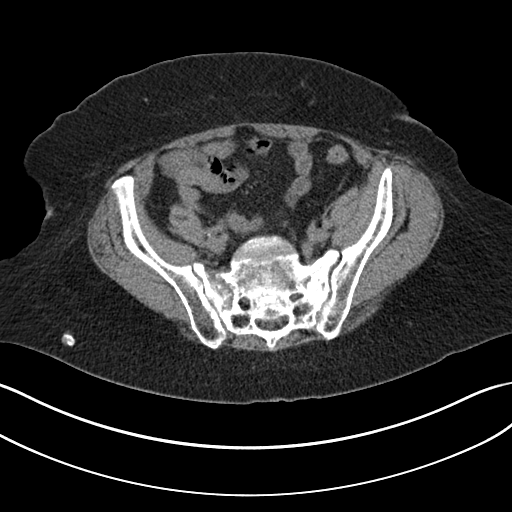
[im 16/44  soft-tissue]
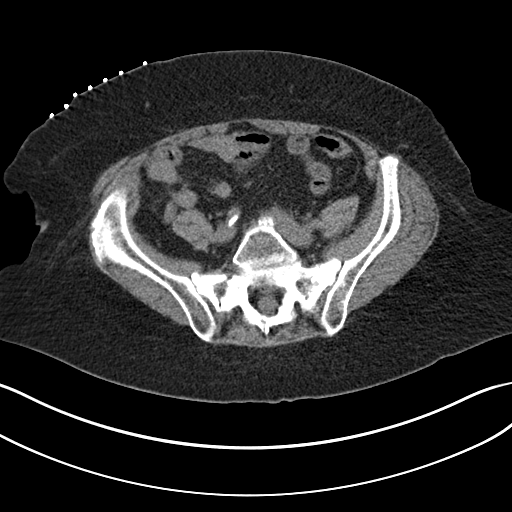
[im 18/44  soft-tissue]
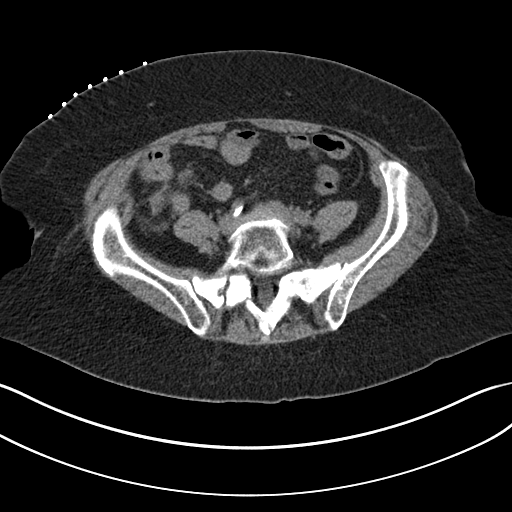
[im 22/44  soft-tissue]
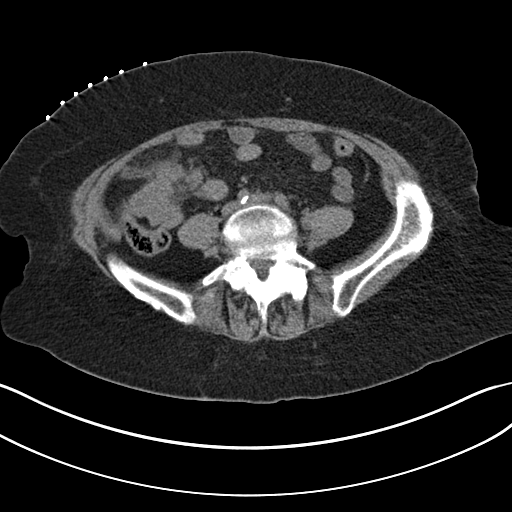
[im 26/44  soft-tissue]
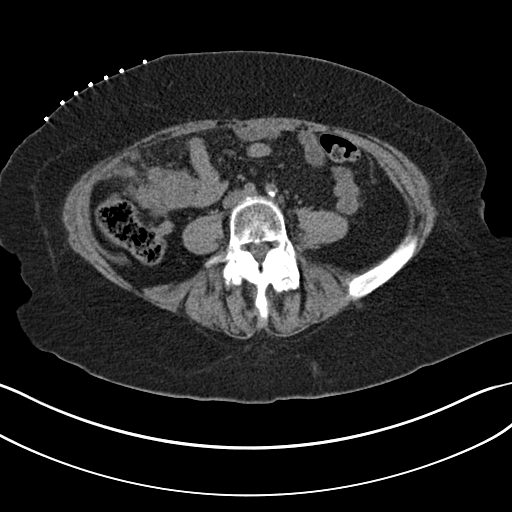
[im 28/44  soft-tissue]
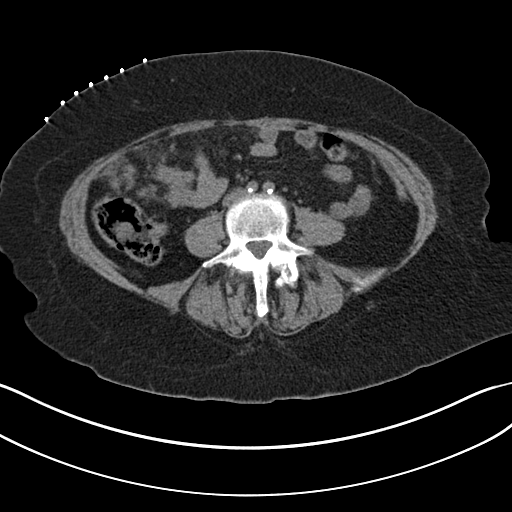
[im 28/44  bone]
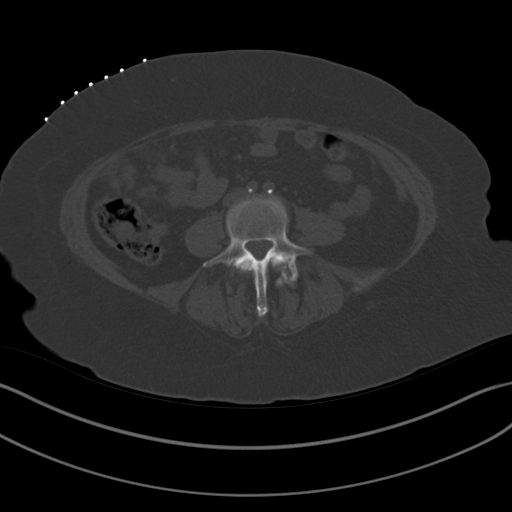
[im 32/44  soft-tissue]
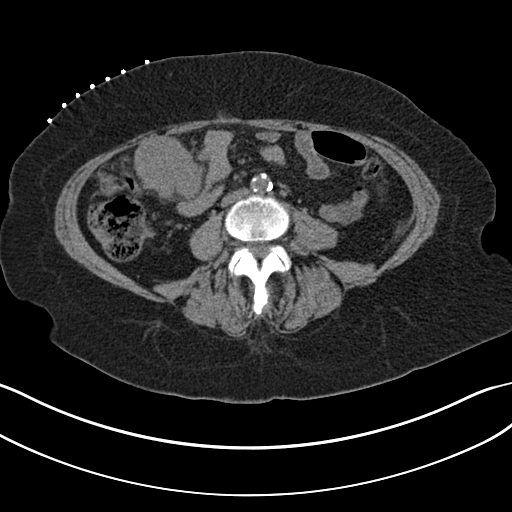
[im 34/44  soft-tissue]
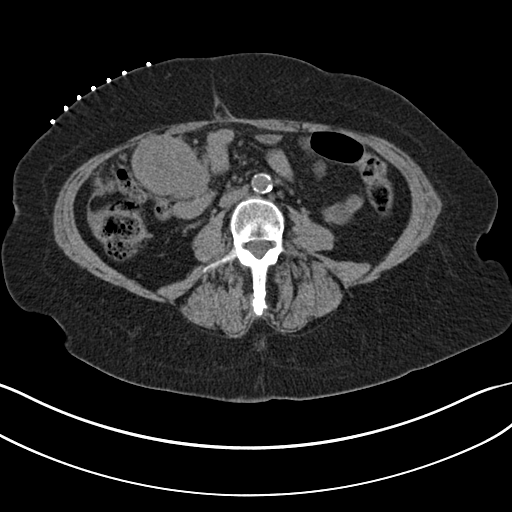
[im 36/44  lung]
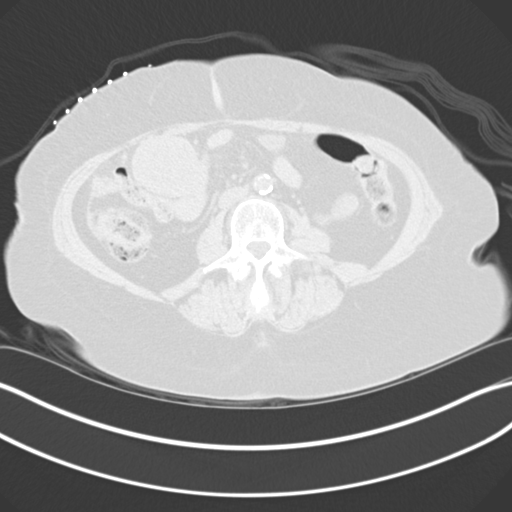
[im 38/44  soft-tissue]
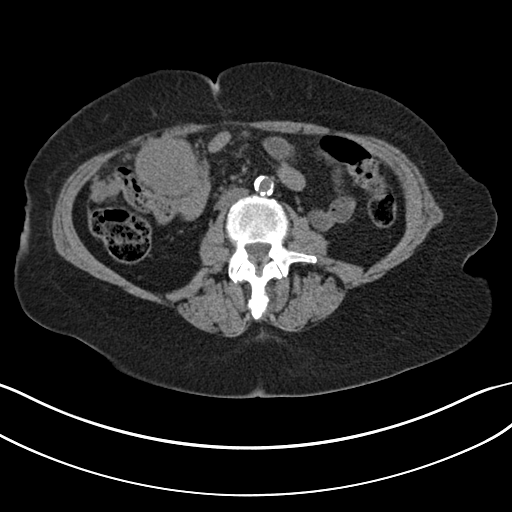
[im 38/44  lung]
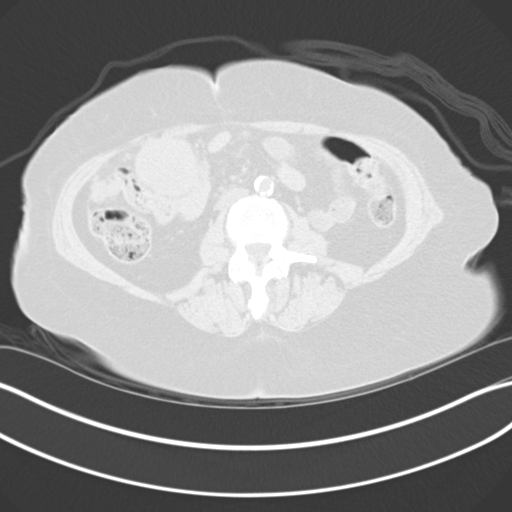
[im 40/44  lung]
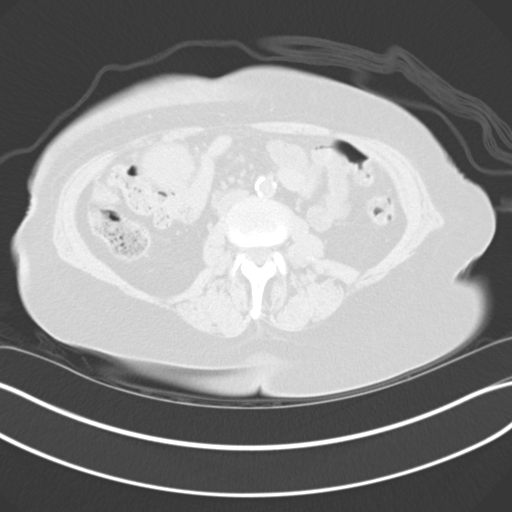
[im 42/44  soft-tissue]
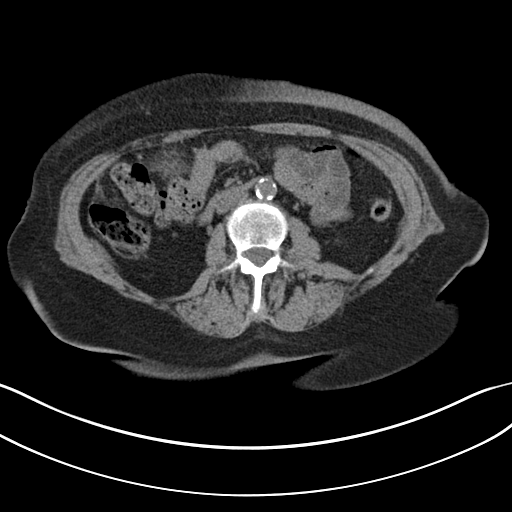
[im 42/44  lung]
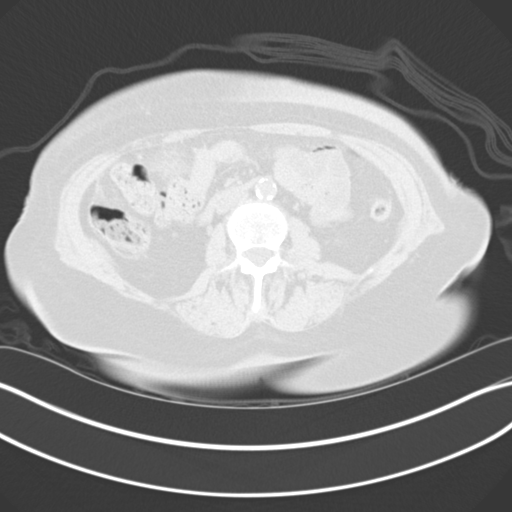

[15 of 32 positions shown; findings below may reference images not displayed]

EXAM:
CT GUIDED CORE BIOPSY OF PERITONEAL MASS

ANESTHESIA/SEDATION:
1.0 mg IV Versed; 50 mcg IV Fentanyl

Total Moderate Sedation Time: 14 minutes.

The patient's level of consciousness and physiologic status were
continuously monitored during the procedure by Radiology nursing.

PROCEDURE:
The procedure risks, benefits, and alternatives were explained to
the patient. Questions regarding the procedure were encouraged and
answered. The patient understands and consents to the procedure. A
time-out was performed prior to initiating the procedure.

The right abdominal wall was prepped with chlorhexidine in a sterile
fashion, and a sterile drape was applied covering the operative
field. A sterile gown and sterile gloves were used for the
procedure. Local anesthesia was provided with 1% Lidocaine.

CT was performed in a supine position through the abdomen. Under CT
guidance, 17 gauge trocar needle was advanced into the right
anterior peritoneal cavity to the level of a peritoneal mass. After
confirming needle tip position, coaxial 18 gauge core biopsy samples
were obtained. Core biopsy samples were submitted in formalin.
Additional CT was performed after needle removal.

COMPLICATIONS:
None
FINDINGS: The largest peritoneal mass in the right anterior lower peritoneal
cavity was targeted measuring roughly 5.8 cm in greatest diameter.
Solid tissue was obtained.
IMPRESSION: CT-guided core biopsy performed of a peritoneal mass in the right
peritoneal cavity.

## 2022-02-07 IMAGING — MG DIGITAL SCREENING BILAT W/ TOMO W/ CAD
6 of 10 series · 6 of 30 positions shown · non-contrast
Comparison: Previous exam(s).

CLINICAL DATA: Screening.

EXAM:
DIGITAL SCREENING BILATERAL MAMMOGRAM WITH TOMO AND CAD

[R MLO synth-2D (1 of 2)]
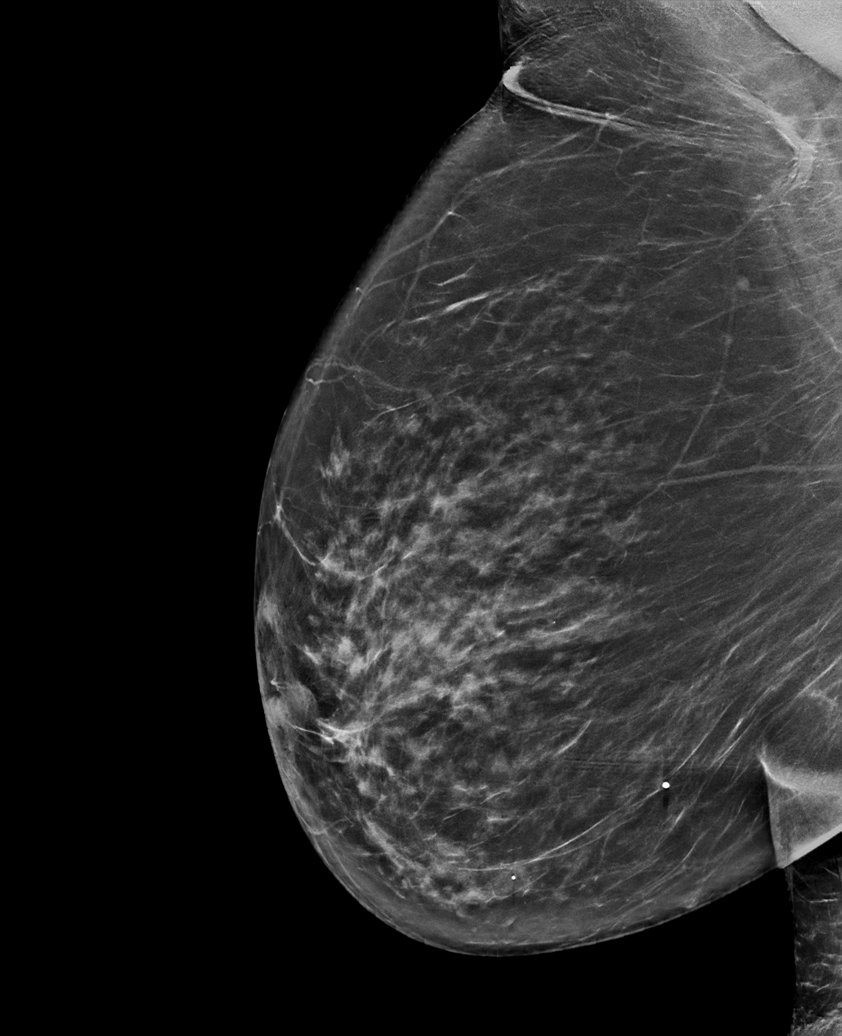

[L MLO synth-2D]
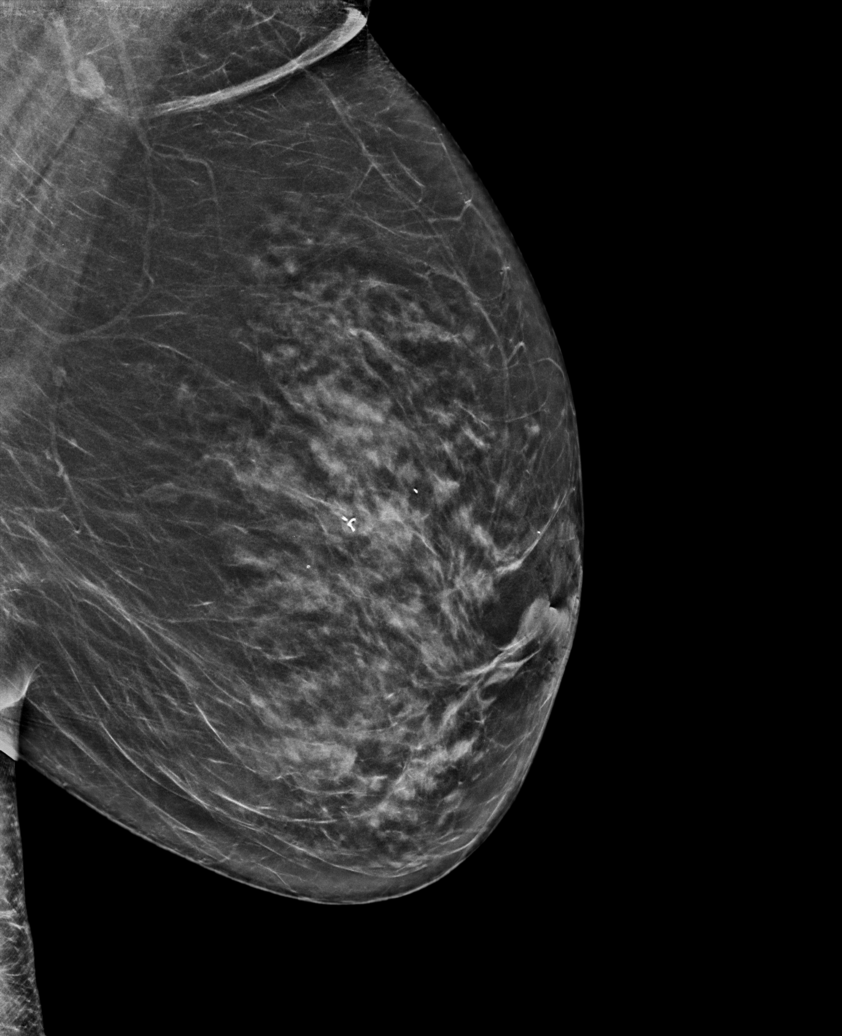

[L CC synth-2D]
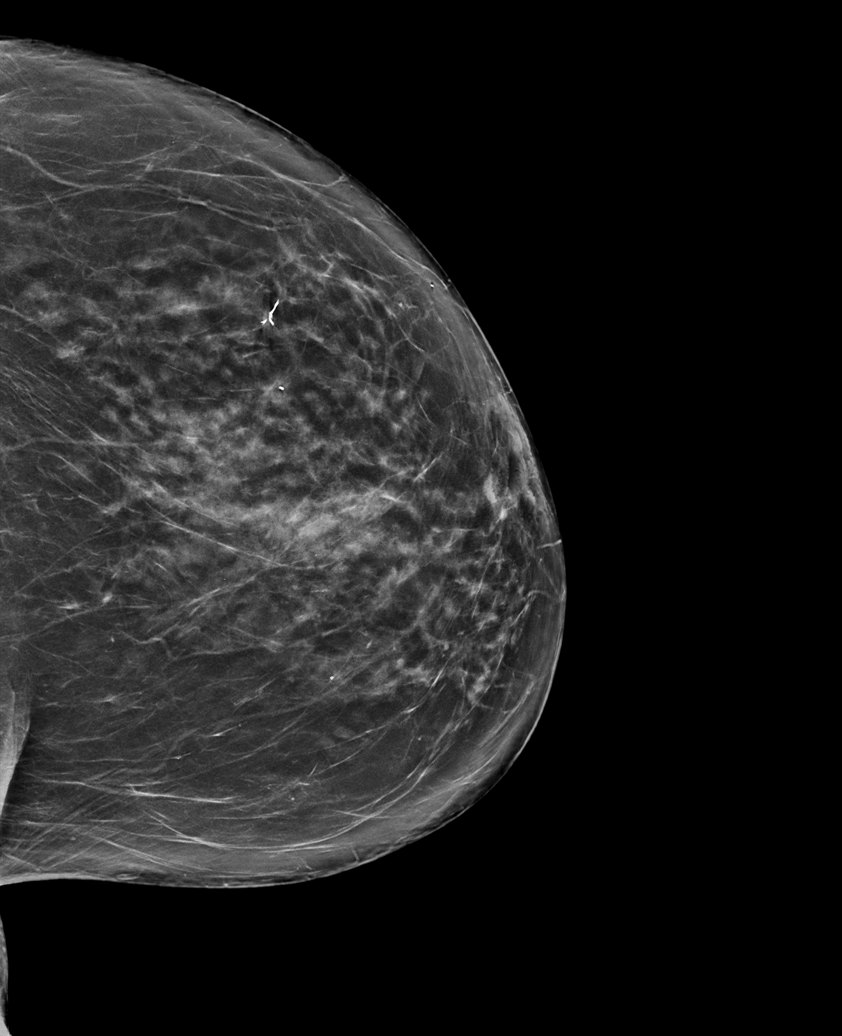

[R CC synth-2D]
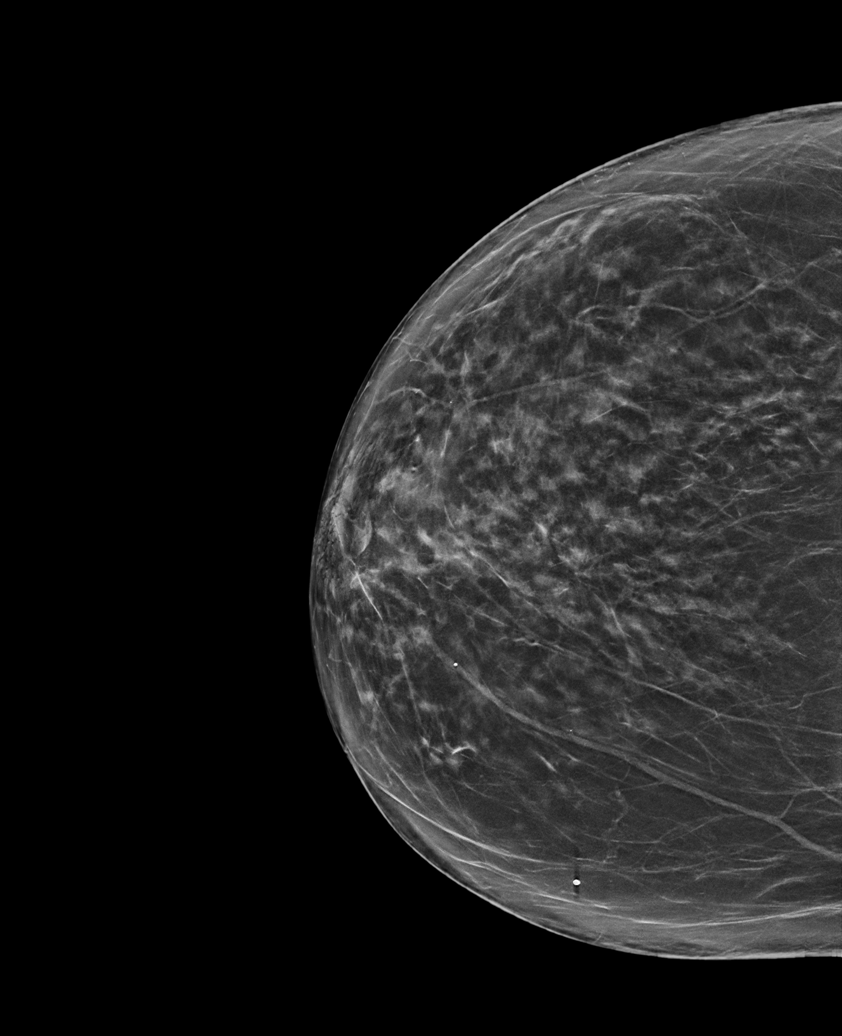

[R MLO synth-2D (2 of 2)]
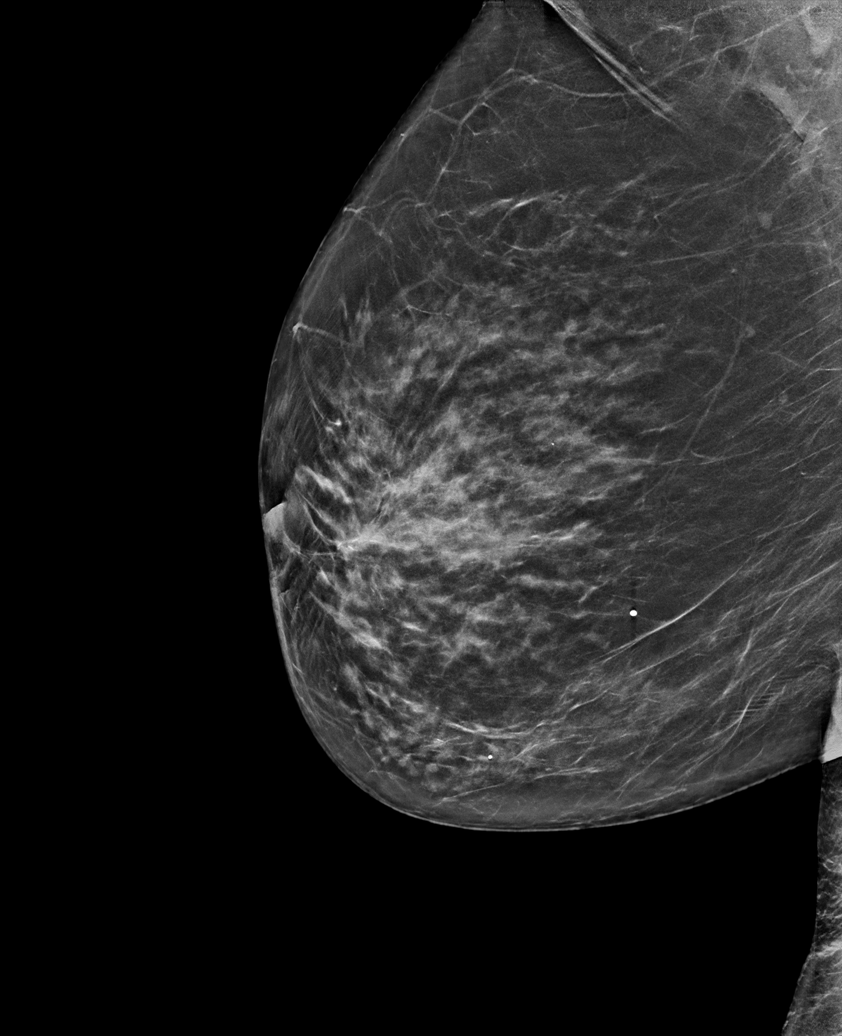

[L MLO tomo · tomo slice 35/69.0]
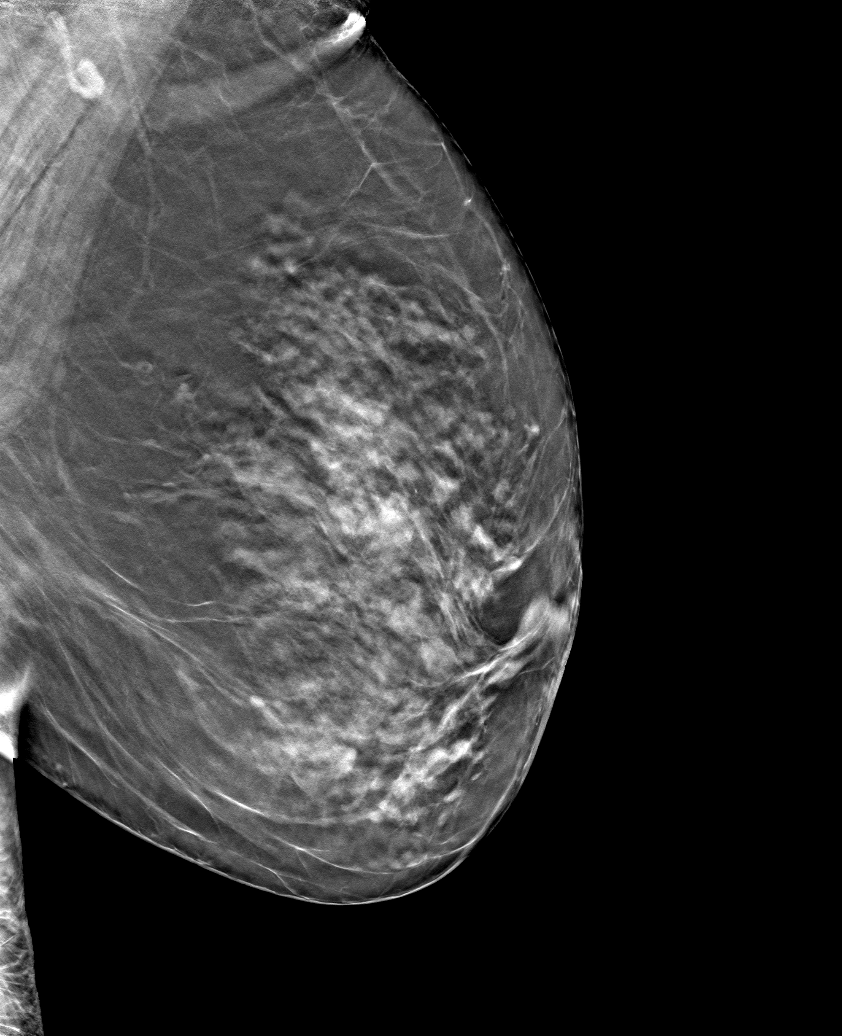

[6 of 30 positions shown; findings below may reference images not displayed]

ACR Breast Density Category b: There are scattered areas of
fibroglandular density.
FINDINGS: There are no findings suspicious for malignancy. Images were
processed with CAD.
IMPRESSION: No mammographic evidence of malignancy. A result letter of this
screening mammogram will be mailed directly to the patient.

RECOMMENDATION:
Screening mammogram in one year. (Code:[id number])

BI-RADS CATEGORY  1: Negative.
